# Patient Record
Sex: Male | Born: 1960 | Race: Black or African American | Hispanic: No | Marital: Married | Smoking: Never smoker
Health system: Southern US, Community
[De-identification: ages and names within clinical notes are randomized; demographics above are authoritative.]

## PROBLEM LIST (undated history)

## (undated) ENCOUNTER — Ambulatory Visit

## (undated) ENCOUNTER — Telehealth: Attending: Certified Registered" | Primary: Certified Registered"

## (undated) ENCOUNTER — Encounter: Attending: Certified Registered" | Primary: Certified Registered"

## (undated) ENCOUNTER — Telehealth

## (undated) ENCOUNTER — Encounter

## (undated) ENCOUNTER — Ambulatory Visit: Payer: MEDICARE

## (undated) ENCOUNTER — Encounter: Attending: Internal Medicine | Primary: Internal Medicine

## (undated) ENCOUNTER — Encounter
Attending: Student in an Organized Health Care Education/Training Program | Primary: Student in an Organized Health Care Education/Training Program

## (undated) ENCOUNTER — Ambulatory Visit: Attending: Family | Primary: Family

## (undated) ENCOUNTER — Inpatient Hospital Stay

## (undated) ENCOUNTER — Encounter: Attending: Family | Primary: Family

## (undated) ENCOUNTER — Telehealth: Payer: MEDICARE | Attending: Psychologist | Primary: Psychologist

## (undated) ENCOUNTER — Telehealth: Attending: Internal Medicine | Primary: Internal Medicine

## (undated) ENCOUNTER — Telehealth
Attending: Student in an Organized Health Care Education/Training Program | Primary: Student in an Organized Health Care Education/Training Program

## (undated) ENCOUNTER — Telehealth: Payer: MEDICARE

## (undated) ENCOUNTER — Encounter: Attending: Nephrology | Primary: Nephrology

## (undated) ENCOUNTER — Ambulatory Visit: Payer: MEDICARE | Attending: Internal Medicine | Primary: Internal Medicine

## (undated) ENCOUNTER — Ambulatory Visit: Payer: BLUE CROSS/BLUE SHIELD

## (undated) ENCOUNTER — Ambulatory Visit: Payer: MEDICARE | Attending: Psychologist | Primary: Psychologist

## (undated) ENCOUNTER — Encounter: Attending: Gastroenterology | Primary: Gastroenterology

## (undated) ENCOUNTER — Ambulatory Visit: Payer: Medicare (Managed Care)

## (undated) ENCOUNTER — Ambulatory Visit: Payer: Medicare (Managed Care) | Attending: Nephrology | Primary: Nephrology

## (undated) ENCOUNTER — Ambulatory Visit
Payer: MEDICARE | Attending: Student in an Organized Health Care Education/Training Program | Primary: Student in an Organized Health Care Education/Training Program

## (undated) ENCOUNTER — Telehealth: Attending: Registered" | Primary: Registered"

## (undated) ENCOUNTER — Ambulatory Visit: Attending: Gastroenterology | Primary: Gastroenterology

## (undated) DIAGNOSIS — M1990 Unspecified osteoarthritis, unspecified site: Secondary | ICD-10-CM

## (undated) DIAGNOSIS — R7303 Prediabetes: Secondary | ICD-10-CM

## (undated) DIAGNOSIS — I10 Essential (primary) hypertension: Secondary | ICD-10-CM

---

## 2017-03-09 ENCOUNTER — Emergency Department (HOSPITAL_COMMUNITY)
Admission: EM | Admit: 2017-03-09 | Discharge: 2017-03-09 | Disposition: A | Discharge status: Home / Self Care | Payer: 59 | Attending: Emergency Medicine | Admitting: Emergency Medicine

## 2017-03-09 ENCOUNTER — Emergency Department (HOSPITAL_COMMUNITY): Payer: 59

## 2017-03-09 ENCOUNTER — Encounter (HOSPITAL_COMMUNITY): Payer: Self-pay | Admitting: Emergency Medicine

## 2017-03-09 DIAGNOSIS — Z23 Encounter for immunization: Secondary | ICD-10-CM | POA: Insufficient documentation

## 2017-03-09 DIAGNOSIS — M79672 Pain in left foot: Secondary | ICD-10-CM | POA: Diagnosis present

## 2017-03-09 DIAGNOSIS — S91332A Puncture wound without foreign body, left foot, initial encounter: Secondary | ICD-10-CM | POA: Diagnosis not present

## 2017-03-09 DIAGNOSIS — Y9289 Other specified places as the place of occurrence of the external cause: Secondary | ICD-10-CM | POA: Insufficient documentation

## 2017-03-09 DIAGNOSIS — Y9389 Activity, other specified: Secondary | ICD-10-CM | POA: Insufficient documentation

## 2017-03-09 DIAGNOSIS — W450XXA Nail entering through skin, initial encounter: Secondary | ICD-10-CM | POA: Insufficient documentation

## 2017-03-09 DIAGNOSIS — I10 Essential (primary) hypertension: Secondary | ICD-10-CM | POA: Insufficient documentation

## 2017-03-09 DIAGNOSIS — Y998 Other external cause status: Secondary | ICD-10-CM | POA: Insufficient documentation

## 2017-03-09 HISTORY — DX: Unspecified osteoarthritis, unspecified site: M19.90

## 2017-03-09 HISTORY — DX: Prediabetes: R73.03

## 2017-03-09 HISTORY — DX: Essential (primary) hypertension: I10

## 2017-03-09 MED ORDER — HYDROCODONE-ACETAMINOPHEN 5-325 MG PO TABS: 1.0000 | ORAL_TABLET | ORAL | 0 refills | Status: DC | PRN

## 2017-03-09 MED ORDER — DOXYCYCLINE HYCLATE 100 MG PO CAPS: 100.0000 mg | ORAL_CAPSULE | Freq: Two times a day (BID) | ORAL | 0 refills | Status: DC

## 2017-03-09 MED ORDER — TETANUS-DIPHTH-ACELL PERTUSSIS 5-2.5-18.5 LF-MCG/0.5 IM SUSP
0.5000 mL | Freq: Once | INTRAMUSCULAR | Status: AC
  Administered 2017-03-09: 0.5 mL via INTRAMUSCULAR
  Filled 2017-03-09: qty 0.5

## 2017-03-09 MED ORDER — DOXYCYCLINE HYCLATE 100 MG PO TABS
100.0000 mg | ORAL_TABLET | Freq: Once | ORAL | Status: AC
  Administered 2017-03-09: 100 mg via ORAL
  Filled 2017-03-09: qty 1

## 2017-03-09 NOTE — Discharge Instructions (Signed)
Please soak your foot in warm Epsom salt water daily. Please dry foot carefully. Use doxycycline 2 times daily with a meal. Please see Dr. Adrian BlackwaterWingfield for recheck of your foot sometime this week. Monitor your glucose levels closely.

## 2017-03-09 NOTE — ED Triage Notes (Signed)
Patient c/o left foot pain. Per patient injured foot while trying to walk up stairs at church. Patient states unable to bear full weight on foot.

## 2017-03-09 NOTE — ED Provider Notes (Signed)
[facility]-EMERGENCY DEPT Provider Note   CSN: [id number] Arrival date & time: 03/09/17  1751     History   Chief Complaint Chief Complaint  Patient presents with  . Foot Pain    HPI Truman HaywardRichard Waldron is a 56 y.o. male.  Patient is a 56 year old male who presents to the emergency department with complaint of left foot pain.  The patient states that he thinks he may have injured his foot while walking up some steps at church today. After that he noted that he could not bear full weight on the left foot. No problem reported of the right foot or lower extremity. It is of note that the patient has diabetes it is also note that he has neuropathies related to his lower back and arthritis and degenerative changes in his lower back. No other problems reported at this time.      Past Medical History:  Diagnosis Date  . Arthritis   . Hypertension   . Prediabetes     There are no active problems to display for this patient.   History reviewed. No pertinent surgical history.     Home Medications    Prior to Admission medications   Not on File    Family History History reviewed. No pertinent family history.  Social History Social History  Substance Use Topics  . Smoking status: Never Smoker  . Smokeless tobacco: Never Used  . Alcohol use No     Allergies   Tramadol   Review of Systems Review of Systems  Constitutional: Negative for activity change.       All ROS Neg except as noted in HPI  HENT: Negative for nosebleeds.   Eyes: Negative for photophobia and discharge.  Respiratory: Negative for cough, shortness of breath and wheezing.   Cardiovascular: Negative for chest pain and palpitations.  Gastrointestinal: Negative for abdominal pain and blood in stool.  Genitourinary: Negative for dysuria, frequency and hematuria.  Musculoskeletal: Positive for arthralgias. Negative for back pain and neck pain.       Foot pain  Skin: Negative.   Neurological: Negative for  dizziness, seizures and speech difficulty.  Psychiatric/Behavioral: Negative for confusion and hallucinations.     Physical Exam Updated Vital Signs BP 134/68 (BP Location: Right Arm)   Pulse 71   Temp 97.6 F (36.4 C) (Oral)   Resp 18   Ht 6' (1.829 m)   Wt (!) 156 kg (344 lb)   SpO2 96%   BMI 46.65 kg/m   Physical Exam  Constitutional: He is oriented to person, place, and time. He appears well-developed and well-nourished.  Non-toxic appearance.  HENT:  Head: Normocephalic.  Right Ear: Tympanic membrane and external ear normal.  Left Ear: Tympanic membrane and external ear normal.  Eyes: Pupils are equal, round, and reactive to light. EOM and lids are normal.  Neck: Normal range of motion. Neck supple. Carotid bruit is not present.  Cardiovascular: Normal rate, regular rhythm, normal heart sounds, intact distal pulses and normal pulses.   Pulmonary/Chest: Breath sounds normal. No respiratory distress.  Abdominal: Soft. Bowel sounds are normal. There is no tenderness. There is no guarding.  Musculoskeletal: Normal range of motion. He exhibits tenderness.  Upon removal of the shoe and sock of the left foot, there is noted a nail in the shoe. There is noted some blood on the nail. There is full range of motion of all toes. Capillary refill is less than 2 seconds. The Achilles tendon is intact. The dorsalis pedis  pulses 2+, and the posterior tibial pulse is 2+. There no temperature changes of the left lower extremity.  Lymphadenopathy:       Head (right side): No submandibular adenopathy present.       Head (left side): No submandibular adenopathy present.    He has no cervical adenopathy.  Neurological: He is alert and oriented to person, place, and time. He has normal strength. No cranial nerve deficit or sensory deficit.  Skin: Skin is warm and dry.  Psychiatric: He has a normal mood and affect. His speech is normal.  Nursing note and vitals reviewed.    ED Treatments /  Results  Labs (all labs ordered are listed, but only abnormal results are displayed) Labs Reviewed - No data to display  EKG  EKG Interpretation None       Radiology Dg Foot Complete Left  Result Date: 03/09/2017 CLINICAL DATA:  Left foot pain after walking up stairs. EXAM: LEFT FOOT - COMPLETE 3+ VIEW COMPARISON:  None. FINDINGS: There is no evidence of fracture or dislocation. There is no evidence of arthropathy. Mild spurring of posterior calcaneus is noted. Soft tissues are unremarkable. IMPRESSION: No acute abnormality seen in the left foot. Electronically Signed   By: Lupita RaiderJames  Green Jr, M.D.   On: 03/09/2017 19:15    Procedures Procedures (including critical care time)  Medications Ordered in ED Medications  doxycycline (VIBRA-TABS) tablet 100 mg (not administered)  Tdap (BOOSTRIX) injection 0.5 mL (not administered)     Initial Impression / Assessment and Plan / ED Course  I have reviewed the triage vital signs and the nursing notes.  Pertinent labs & imaging results that were available during my care of the patient were reviewed by me and considered in my medical decision making (see chart for details).       Final Clinical Impressions(s) / ED Diagnoses MDM Vital signs within normal limits.  Nail was noted in the shoe on the left foot. There was blood noted on the nail. No neurovascular deficit appreciated. There are arthritis changes noted on the x-ray of the left foot, but no fracture, no dislocations, and no foreign body appreciated.  Patient's tetanus status was updated. Patient started on doxycycline. The patient will use warm saltwater soaks daily. Prescription for doxycycline given to the patient. The patient is to follow-up with Dr. Adrian BlackwaterWingfield his primary physician this week. The patient will return to the emergency department sooner if any signs of advancing infection. Patient is in agreement with this plan.    Final diagnoses:  Puncture wound of left foot,  initial encounter    New Prescriptions New Prescriptions   No medications on file     Duayne CalBryant, Eldrick Penick, PA-C 03/09/17 2126    Doug SouJacubowitz, Sam, MD 03/10/17 [phone]

## 2018-04-27 ENCOUNTER — Encounter: Admit: 2018-04-27 | Discharge: 2018-04-28 | Payer: MEDICARE

## 2018-04-27 DIAGNOSIS — R748 Abnormal levels of other serum enzymes: Principal | ICD-10-CM

## 2018-04-27 DIAGNOSIS — Z01818 Encounter for other preprocedural examination: Secondary | ICD-10-CM

## 2018-04-27 DIAGNOSIS — K7290 Hepatic failure, unspecified without coma: Secondary | ICD-10-CM

## 2018-04-29 ENCOUNTER — Encounter: Admit: 2018-04-29 | Discharge: 2018-04-29 | Payer: MEDICARE | Attending: Internal Medicine | Primary: Internal Medicine

## 2018-04-29 DIAGNOSIS — R748 Abnormal levels of other serum enzymes: Principal | ICD-10-CM

## 2018-05-19 ENCOUNTER — Encounter: Admit: 2018-05-19 | Discharge: 2018-05-20 | Payer: MEDICARE | Attending: Registered" | Primary: Registered"

## 2018-05-19 ENCOUNTER — Ambulatory Visit: Admit: 2018-05-19 | Discharge: 2018-05-20 | Payer: MEDICARE

## 2018-05-19 ENCOUNTER — Encounter: Admit: 2018-05-19 | Discharge: 2018-05-20 | Payer: MEDICARE

## 2018-05-19 DIAGNOSIS — Z01818 Encounter for other preprocedural examination: Principal | ICD-10-CM

## 2018-05-19 DIAGNOSIS — K7290 Hepatic failure, unspecified without coma: Secondary | ICD-10-CM

## 2018-05-19 DIAGNOSIS — N186 End stage renal disease: Secondary | ICD-10-CM

## 2018-05-20 ENCOUNTER — Encounter: Admit: 2018-05-20 | Discharge: 2018-05-21 | Payer: MEDICARE

## 2018-05-20 DIAGNOSIS — K7290 Hepatic failure, unspecified without coma: Secondary | ICD-10-CM

## 2018-05-20 DIAGNOSIS — Z01818 Encounter for other preprocedural examination: Principal | ICD-10-CM

## 2018-05-25 ENCOUNTER — Encounter: Admit: 2018-05-25 | Discharge: 2018-05-26 | Payer: MEDICARE

## 2018-05-25 DIAGNOSIS — K7460 Unspecified cirrhosis of liver: Secondary | ICD-10-CM

## 2018-05-25 DIAGNOSIS — K7290 Hepatic failure, unspecified without coma: Principal | ICD-10-CM

## 2018-05-25 DIAGNOSIS — G4733 Obstructive sleep apnea (adult) (pediatric): Secondary | ICD-10-CM

## 2018-05-25 DIAGNOSIS — I639 Cerebral infarction, unspecified: Secondary | ICD-10-CM

## 2018-05-25 DIAGNOSIS — K7581 Nonalcoholic steatohepatitis (NASH): Secondary | ICD-10-CM

## 2018-05-25 DIAGNOSIS — Z794 Long term (current) use of insulin: Secondary | ICD-10-CM

## 2018-05-25 DIAGNOSIS — E119 Type 2 diabetes mellitus without complications: Secondary | ICD-10-CM

## 2018-05-25 DIAGNOSIS — Z6833 Body mass index (BMI) 33.0-33.9, adult: Secondary | ICD-10-CM

## 2018-05-25 DIAGNOSIS — Z944 Liver transplant status: Principal | ICD-10-CM

## 2018-05-25 DIAGNOSIS — E6609 Other obesity due to excess calories: Secondary | ICD-10-CM

## 2018-05-25 DIAGNOSIS — E785 Hyperlipidemia, unspecified: Secondary | ICD-10-CM

## 2018-05-25 DIAGNOSIS — I10 Essential (primary) hypertension: Secondary | ICD-10-CM

## 2018-07-20 ENCOUNTER — Encounter: Admit: 2018-07-20 | Discharge: 2018-07-21 | Payer: MEDICARE

## 2018-07-20 ENCOUNTER — Ambulatory Visit: Admit: 2018-07-20 | Discharge: 2018-07-21 | Payer: MEDICARE | Attending: Psychologist | Primary: Psychologist

## 2018-07-20 ENCOUNTER — Ambulatory Visit: Admit: 2018-07-20 | Discharge: 2018-07-21 | Payer: MEDICARE | Attending: Internal Medicine | Primary: Internal Medicine

## 2018-07-20 DIAGNOSIS — K7460 Unspecified cirrhosis of liver: Principal | ICD-10-CM

## 2018-07-20 DIAGNOSIS — Z8673 Personal history of transient ischemic attack (TIA), and cerebral infarction without residual deficits: Secondary | ICD-10-CM

## 2018-07-20 DIAGNOSIS — Z01818 Encounter for other preprocedural examination: Principal | ICD-10-CM

## 2018-07-20 DIAGNOSIS — F4323 Adjustment disorder with mixed anxiety and depressed mood: Secondary | ICD-10-CM

## 2018-07-20 DIAGNOSIS — K7290 Hepatic failure, unspecified without coma: Secondary | ICD-10-CM

## 2019-03-29 NOTE — Unmapped (Signed)
Patient states at night his stomach is hurting after he eats. pain is close to gall bladder  Pain in the lower part of my back  He is unable to sleep. Bowels are normal.

## 2019-03-30 ENCOUNTER — Telehealth: Admit: 2019-03-30 | Discharge: 2019-03-31

## 2019-05-25 ENCOUNTER — Ambulatory Visit: Admit: 2019-05-25 | Discharge: 2019-05-26

## 2019-05-25 DIAGNOSIS — K7460 Unspecified cirrhosis of liver: Principal | ICD-10-CM

## 2019-05-27 MED ORDER — CHOLECALCIFEROL (VITAMIN D3) 25 MCG (1,000 UNIT) TABLET: 2000 [IU] | tablet | Freq: Every day | 11 refills | 30 days | Status: AC

## 2019-10-25 ENCOUNTER — Institutional Professional Consult (permissible substitution): Admit: 2019-10-25 | Discharge: 2019-10-26

## 2019-11-24 DIAGNOSIS — Z01818 Encounter for other preprocedural examination: Principal | ICD-10-CM

## 2019-11-26 DIAGNOSIS — K7290 Hepatic failure, unspecified without coma: Principal | ICD-10-CM

## 2020-01-03 ENCOUNTER — Encounter: Admit: 2020-01-03 | Discharge: 2020-01-04 | Payer: MEDICARE | Attending: Psychologist | Primary: Psychologist

## 2020-01-03 ENCOUNTER — Institutional Professional Consult (permissible substitution): Admit: 2020-01-03 | Discharge: 2020-01-04 | Payer: MEDICARE

## 2020-01-03 ENCOUNTER — Encounter: Admit: 2020-01-03 | Discharge: 2020-01-04 | Payer: MEDICARE | Attending: Nutritionist | Primary: Nutritionist

## 2020-01-03 ENCOUNTER — Encounter: Admit: 2020-01-03 | Discharge: 2020-01-04 | Payer: MEDICARE

## 2020-01-18 ENCOUNTER — Encounter: Admit: 2020-01-18 | Discharge: 2020-01-18 | Payer: MEDICARE

## 2020-01-18 ENCOUNTER — Ambulatory Visit: Admit: 2020-01-18 | Discharge: 2020-01-18 | Payer: MEDICARE

## 2020-01-18 DIAGNOSIS — Z01818 Encounter for other preprocedural examination: Principal | ICD-10-CM

## 2020-01-24 ENCOUNTER — Encounter: Admit: 2020-01-24 | Discharge: 2020-01-24 | Payer: MEDICARE

## 2020-01-24 ENCOUNTER — Ambulatory Visit: Admit: 2020-01-24 | Discharge: 2020-01-24 | Payer: MEDICARE

## 2020-01-24 DIAGNOSIS — I639 Cerebral infarction, unspecified: Principal | ICD-10-CM

## 2020-01-24 DIAGNOSIS — I10 Essential (primary) hypertension: Principal | ICD-10-CM

## 2020-01-24 DIAGNOSIS — Z01818 Encounter for other preprocedural examination: Principal | ICD-10-CM

## 2020-01-24 DIAGNOSIS — K7290 Hepatic failure, unspecified without coma: Principal | ICD-10-CM

## 2020-01-24 DIAGNOSIS — G4733 Obstructive sleep apnea (adult) (pediatric): Principal | ICD-10-CM

## 2020-01-24 DIAGNOSIS — E119 Type 2 diabetes mellitus without complications: Principal | ICD-10-CM

## 2020-01-24 DIAGNOSIS — Z794 Long term (current) use of insulin: Secondary | ICD-10-CM

## 2020-05-31 ENCOUNTER — Emergency Department (HOSPITAL_COMMUNITY)
Admission: EM | Admit: 2020-05-31 | Discharge: 2020-05-31 | Disposition: A | Discharge status: Home / Self Care | Payer: 59 | Attending: Emergency Medicine | Admitting: Emergency Medicine

## 2020-05-31 ENCOUNTER — Encounter (HOSPITAL_COMMUNITY): Payer: Self-pay | Admitting: Emergency Medicine

## 2020-05-31 ENCOUNTER — Other Ambulatory Visit: Payer: Self-pay

## 2020-05-31 ENCOUNTER — Emergency Department (HOSPITAL_COMMUNITY): Payer: 59

## 2020-05-31 DIAGNOSIS — M25511 Pain in right shoulder: Secondary | ICD-10-CM

## 2020-05-31 DIAGNOSIS — I10 Essential (primary) hypertension: Secondary | ICD-10-CM | POA: Diagnosis not present

## 2020-05-31 DIAGNOSIS — M19011 Primary osteoarthritis, right shoulder: Secondary | ICD-10-CM | POA: Insufficient documentation

## 2020-05-31 MED ORDER — HYDROCODONE-ACETAMINOPHEN 5-325 MG PO TABS: 1.0000 | ORAL_TABLET | ORAL | 0 refills | Status: DC | PRN

## 2020-05-31 MED ORDER — HYDROCODONE-ACETAMINOPHEN 5-325 MG PO TABS
1.0000 | ORAL_TABLET | Freq: Once | ORAL | Status: AC
  Administered 2020-05-31: 1 via ORAL
  Filled 2020-05-31: qty 1

## 2020-05-31 NOTE — ED Notes (Signed)
Patient transported to X-ray 

## 2020-05-31 NOTE — ED Triage Notes (Signed)
Pt c/o right arm pain x1 month. Pt states he can no longer stand the pain.

## 2020-05-31 NOTE — Discharge Instructions (Signed)
Schedule to see the Orthopaedist for evaluation.  You will need to obtain your previous ct scan for the Orthopaedist to see

## 2020-06-01 NOTE — ED Provider Notes (Signed)
[facility] EMERGENCY DEPARTMENT Provider Note   CSN: [id number] Arrival date & time: 05/31/20  7616     History Chief Complaint  Patient presents with  . Arm Pain    William Kemp is a 59 y.o. male.  The history is provided by the patient. No language interpreter was used.  Arm Pain This is a recurrent problem. Episode onset: 1 month. The problem occurs constantly. The problem has been gradually worsening. Nothing aggravates the symptoms. Nothing relieves the symptoms. He has tried nothing for the symptoms. The treatment provided no relief.   Pt reports he has had shoulder pain in the past.  Pt out of pain medications.  Pt reports he has had an evaluation and ct in the past     Past Medical History:  Diagnosis Date  . Arthritis   . Hypertension   . Prediabetes     There are no problems to display for this patient.   History reviewed. No pertinent surgical history.     History reviewed. No pertinent family history.  Social History   Tobacco Use  . Smoking status: Never Smoker  . Smokeless tobacco: Never Used  Vaping Use  . Vaping Use: Never used  Substance Use Topics  . Alcohol use: No  . Drug use: No    Home Medications Prior to Admission medications   Medication Sig Start Date End Date Taking? Authorizing Provider  doxycycline (VIBRAMYCIN) 100 MG capsule Take 1 capsule (100 mg total) by mouth 2 (two) times daily. 03/09/17   Ivery Quale, PA-C  HYDROcodone-acetaminophen (NORCO/VICODIN) 5-325 MG tablet Take 1 tablet by mouth every 4 (four) hours as needed. 05/31/20   Elson Areas, PA-C    Allergies    Tramadol  Review of Systems   Review of Systems  All other systems reviewed and are negative.   Physical Exam Updated Vital Signs BP 131/73 (BP Location: Left Arm)   Pulse (!) 52   Temp 98.2 F (36.8 C) (Oral)   Resp 18   Ht 6' (1.829 m)   Wt (!) 152 kg   SpO2 98%   BMI 45.43 kg/m   Physical Exam Vitals and nursing note reviewed.    Constitutional:      Appearance: He is well-developed.  HENT:     Head: Normocephalic and atraumatic.  Eyes:     Conjunctiva/sclera: Conjunctivae normal.  Cardiovascular:     Rate and Rhythm: Normal rate and regular rhythm.     Heart sounds: No murmur heard.   Pulmonary:     Effort: Pulmonary effort is normal. No respiratory distress.     Breath sounds: Normal breath sounds.  Abdominal:     Palpations: Abdomen is soft.     Tenderness: There is no abdominal tenderness.  Musculoskeletal:        General: Normal range of motion.     Cervical back: Neck supple.  Skin:    General: Skin is warm and dry.  Neurological:     General: No focal deficit present.     Mental Status: He is alert.  Psychiatric:        Mood and Affect: Mood normal.     ED Results / Procedures / Treatments   Labs (all labs ordered are listed, but only abnormal results are displayed) Labs Reviewed - No data to display  EKG None  Radiology DG Shoulder Right  Result Date: 05/31/2020 CLINICAL DATA:  Pain for 6 years.  No injury. EXAM: RIGHT SHOULDER - 2+  VIEW COMPARISON:  None. FINDINGS: There is no evidence of fracture or dislocation. Mild glenohumeral osteoarthritis with marginal osteophytes. Small inferiorly directed osteophyte arising from the acromion. IMPRESSION: 1. No evidence of acute fracture or dislocation. 2. Mild glenohumeral osteoarthritis with marginal osteophytes. Electronically Signed   By: Feliberto Harts MD   On: 05/31/2020 10:19    Procedures Procedures (including critical care time)  Medications Ordered in ED Medications  HYDROcodone-acetaminophen (NORCO/VICODIN) 5-325 MG per tablet 1 tablet (1 tablet Oral Given 05/31/20 1059)    ED Course  I have reviewed the triage vital signs and the nursing notes.  Pertinent labs & imaging results that were available during my care of the patient were reviewed by me and considered in my medical decision making (see chart for details).     MDM Rules/Calculators/A&P                          MDM:  Xray reviewed.  Pt given hydrocodone.  Rx for hydrocodone.  Pt advised to schedule to see Dr. Romeo Apple.  Discuss pain medication with primary MD.  Final Clinical Impression(s) / ED Diagnoses Final diagnoses:  Primary osteoarthritis of right shoulder  Acute pain of right shoulder    Rx / DC Orders ED Discharge Orders         Ordered    HYDROcodone-acetaminophen (NORCO/VICODIN) 5-325 MG tablet  Every 4 hours PRN,   Status:  Discontinued        05/31/20 1045    HYDROcodone-acetaminophen (NORCO/VICODIN) 5-325 MG tablet  Every 4 hours PRN        05/31/20 1051        An After Visit Summary was printed and given to the patient.    Elson Areas, [place] 06/01/20 1026    Terrilee Files, MD 06/01/20 1247

## 2020-06-12 ENCOUNTER — Other Ambulatory Visit: Payer: Self-pay

## 2020-06-12 ENCOUNTER — Encounter: Payer: Self-pay | Admitting: Orthopedic Surgery

## 2020-06-12 ENCOUNTER — Ambulatory Visit (INDEPENDENT_AMBULATORY_CARE_PROVIDER_SITE_OTHER): Payer: 59 | Admitting: Orthopedic Surgery

## 2020-06-12 ENCOUNTER — Ambulatory Visit: Payer: 59

## 2020-06-12 VITALS — BP 145/70 | HR 88 | Ht 72.0 in | Wt 344.0 lb

## 2020-06-12 DIAGNOSIS — M4722 Other spondylosis with radiculopathy, cervical region: Secondary | ICD-10-CM

## 2020-06-12 DIAGNOSIS — I509 Heart failure, unspecified: Secondary | ICD-10-CM | POA: Insufficient documentation

## 2020-06-12 DIAGNOSIS — G4730 Sleep apnea, unspecified: Secondary | ICD-10-CM

## 2020-06-12 DIAGNOSIS — M109 Gout, unspecified: Secondary | ICD-10-CM | POA: Insufficient documentation

## 2020-06-12 DIAGNOSIS — F40240 Claustrophobia: Secondary | ICD-10-CM | POA: Diagnosis not present

## 2020-06-12 DIAGNOSIS — N138 Other obstructive and reflux uropathy: Secondary | ICD-10-CM | POA: Insufficient documentation

## 2020-06-12 DIAGNOSIS — N529 Male erectile dysfunction, unspecified: Secondary | ICD-10-CM

## 2020-06-12 DIAGNOSIS — M792 Neuralgia and neuritis, unspecified: Secondary | ICD-10-CM

## 2020-06-12 DIAGNOSIS — I10 Essential (primary) hypertension: Secondary | ICD-10-CM | POA: Insufficient documentation

## 2020-06-12 DIAGNOSIS — E119 Type 2 diabetes mellitus without complications: Secondary | ICD-10-CM | POA: Insufficient documentation

## 2020-06-12 HISTORY — DX: Male erectile dysfunction, unspecified: N52.9

## 2020-06-12 HISTORY — DX: Gout, unspecified: M10.9

## 2020-06-12 HISTORY — DX: Sleep apnea, unspecified: G47.30

## 2020-06-12 HISTORY — DX: Other obstructive and reflux uropathy: N13.8

## 2020-06-12 HISTORY — DX: Type 2 diabetes mellitus without complications: E11.9

## 2020-06-12 HISTORY — DX: Morbid (severe) obesity due to excess calories: E66.01

## 2020-06-12 HISTORY — DX: Heart failure, unspecified: I50.9

## 2020-06-12 MED ORDER — DIAZEPAM 5 MG PO TABS: 5.0000 mg | ORAL_TABLET | Freq: Once | ORAL | 0 refills | Status: AC

## 2020-06-12 NOTE — Patient Instructions (Signed)
MRI scan has been ordered for you,your insurance doesn't require an authorization you can call to schedule at [facility] the number is [phone]

## 2020-06-12 NOTE — Progress Notes (Signed)
Chief Complaint  Patient presents with  . Shoulder Pain    right   . Arm Pain    entire right arm painful / right hand goes numb  . Neck Pain    59 year old male with chronic neck pain and radiation of pain into his right arm sending for MRI with Valium due to severe claustrophobia  Encounter Diagnoses  Name Primary?  . Radicular pain of right upper extremity   . Cervical spondylosis with radiculopathy   . Claustrophobia Yes     History this is a 59 year old male on Eliquis secondary to A. fib had a lower back injury several years ago at work since that time he has had neck pain which now includes right arm pain radiating to his right hand for the last 6 months worsening.  Had physical therapy medication did not improve his night pain became so severe that he went to the emergency room and they referred him here  He notes his color changes in his hand without weakness just severe pain  Review of systems arthritis heart disease A. fib hypertension  Review of systems neck back and joint pain tingling right upper extremity pain in legs after walking all other review of systems were negative  Past Medical History:  Diagnosis Date  . Arthritis   . Diabetes mellitus (HCC) 06/12/2020  . Hypertension   . Prediabetes    History reviewed. No pertinent surgical history.   History reviewed. No pertinent family history.   BP (!) 145/70   Pulse 88   Ht 6' (1.829 m)   Wt (!) 344 lb (156 kg)   BMI 46.65 kg/m   The patient meets the AMA guidelines for Morbid (severe) obesity with a BMI > 40.0 and I have recommended weight loss.  General appearance: Development normal nutrition obesity grooming normal  Cardiovascular normal pulses bilaterally in the upper extremities normal temperature no edema  Lymph nodes normal neck and axilla right arm  Gait no abnormalities  Cervical spine tender primarily mid to lower no periscapular tenderness upper neck tenderness in the trapezius muscle  full range of motion of the right shoulder with normal strength in both upper extremities including C5-6-7 8 T1  No increased muscle tone is noted.  Both shoulders are stable.  Skin is normal in the upper extremities and neck and back.  Sensation is normal  Reflexes are 2+ and equal  He is oriented x3 his mood and affect are normal

## 2020-06-22 ENCOUNTER — Other Ambulatory Visit: Payer: Self-pay

## 2020-06-22 ENCOUNTER — Ambulatory Visit (HOSPITAL_COMMUNITY)
Admission: RE | Admit: 2020-06-22 | Discharge: 2020-06-22 | Disposition: A | Discharge status: Home / Self Care | Payer: 59 | Source: Ambulatory Visit | Attending: Orthopedic Surgery | Admitting: Orthopedic Surgery

## 2020-06-22 DIAGNOSIS — M792 Neuralgia and neuritis, unspecified: Secondary | ICD-10-CM

## 2020-06-23 ENCOUNTER — Telehealth: Payer: Self-pay | Admitting: Orthopedic Surgery

## 2020-06-23 NOTE — Telephone Encounter (Signed)
Called patient to schedule an appt / follow up after MRI.  Left message to call the office and schedule an appt.

## 2020-06-27 NOTE — Telephone Encounter (Signed)
Called patient to schedule an appt / follow up after MRI.  Left message to call the office and schedule an appt.  °

## 2020-07-14 ENCOUNTER — Encounter: Payer: Self-pay | Admitting: Orthopedic Surgery

## 2020-07-19 ENCOUNTER — Telehealth: Payer: Self-pay | Admitting: Radiology

## 2020-07-19 NOTE — Telephone Encounter (Signed)
Patient called today and said he needed to make a f/u appt to discuss MRI results.  MRI done 06/22/20.  I have now updated his phone number in the system.  I think we had an old number, and we had mailed him a letter to call us to schedule.    Would you want to call results to him since your schedule is so full?  Or tell me where/when you would like to work him in?

## 2020-07-20 NOTE — Telephone Encounter (Signed)
I LL LOOK AT IT TODAY

## 2020-08-15 DIAGNOSIS — Z7682 Awaiting organ transplant status: Principal | ICD-10-CM

## 2020-08-15 DIAGNOSIS — K7210 Chronic hepatic failure without coma: Principal | ICD-10-CM

## 2020-08-21 ENCOUNTER — Ambulatory Visit: Admit: 2020-08-21 | Discharge: 2020-08-22 | Payer: MEDICARE

## 2020-08-21 DIAGNOSIS — K7460 Unspecified cirrhosis of liver: Principal | ICD-10-CM

## 2020-08-21 DIAGNOSIS — Z6837 Body mass index (BMI) 37.0-37.9, adult: Principal | ICD-10-CM

## 2020-08-21 MED ORDER — SPIRONOLACTONE 50 MG TABLET
ORAL_TABLET | Freq: Every day | ORAL | 3 refills | 90 days | Status: CP
Start: 2020-08-21 — End: 2020-09-11

## 2020-08-21 MED ORDER — FUROSEMIDE 20 MG TABLET
ORAL_TABLET | Freq: Every day | ORAL | 3 refills | 90.00000 days | Status: CP
Start: 2020-08-21 — End: 2020-09-11

## 2020-08-22 ENCOUNTER — Ambulatory Visit: Admit: 2020-08-22 | Discharge: 2020-08-23 | Payer: MEDICARE

## 2020-08-22 DIAGNOSIS — K7460 Unspecified cirrhosis of liver: Principal | ICD-10-CM

## 2020-08-22 DIAGNOSIS — R188 Other ascites: Principal | ICD-10-CM

## 2020-08-22 DIAGNOSIS — I10 Essential (primary) hypertension: Principal | ICD-10-CM

## 2020-08-23 DIAGNOSIS — K652 Spontaneous bacterial peritonitis: Principal | ICD-10-CM

## 2020-08-23 MED ORDER — SULFAMETHOXAZOLE 800 MG-TRIMETHOPRIM 160 MG TABLET
ORAL_TABLET | Freq: Every day | ORAL | 0 refills | 90.00000 days | Status: CP
Start: 2020-08-23 — End: 2020-09-11

## 2020-08-29 DIAGNOSIS — Z7682 Awaiting organ transplant status: Principal | ICD-10-CM

## 2020-08-29 DIAGNOSIS — K7210 Chronic hepatic failure without coma: Principal | ICD-10-CM

## 2020-08-30 ENCOUNTER — Ambulatory Visit: Admit: 2020-08-30 | Discharge: 2020-08-31 | Payer: MEDICARE

## 2020-08-30 DIAGNOSIS — K652 Spontaneous bacterial peritonitis: Principal | ICD-10-CM

## 2020-08-30 DIAGNOSIS — K625 Hemorrhage of anus and rectum: Principal | ICD-10-CM

## 2020-08-30 DIAGNOSIS — K7460 Unspecified cirrhosis of liver: Principal | ICD-10-CM

## 2020-08-30 DIAGNOSIS — R188 Other ascites: Principal | ICD-10-CM

## 2020-08-31 ENCOUNTER — Other Ambulatory Visit: Payer: Self-pay

## 2020-08-31 ENCOUNTER — Ambulatory Visit (INDEPENDENT_AMBULATORY_CARE_PROVIDER_SITE_OTHER): Payer: 59 | Admitting: Orthopedic Surgery

## 2020-08-31 ENCOUNTER — Encounter: Payer: Self-pay | Admitting: Orthopedic Surgery

## 2020-08-31 VITALS — BP 171/84 | HR 62 | Ht 72.0 in | Wt 350.0 lb

## 2020-08-31 DIAGNOSIS — M4722 Other spondylosis with radiculopathy, cervical region: Secondary | ICD-10-CM | POA: Diagnosis not present

## 2020-08-31 NOTE — Patient Instructions (Signed)
Anterior Cervical Diskectomy and Fusion Anterior cervical diskectomy and fusion is a surgery to remove and replace an intervertebral disk. Intervertebral disks are plates of cartilage located between the bones of the spine. This surgery is done when an intervertebral disk in the neck puts pressure on the spine or on a nerve. The surgery is done through the front (anterior) part of the neck. During the surgery, the damaged disk is removed and replaced with a plastic implant, a bone from another part of the body (bone graft), or both. Sometimes metal plates and screws are also used to keep the implant or bone graft in place and to join the bones together. Tell a health care provider about:  Any allergies you have.  All medicines you are taking, including vitamins, herbs, eye drops, creams, and over-the-counter medicines.  Any problems you or family members have had with anesthetic medicines.  Any blood disorders you have.  Any surgeries you have had.  Any medical conditions you have or have had.  Whether you are pregnant or may be pregnant. What are the risks? Generally, this is a safe procedure. However, problems may occur, including:  Infection.  Bleeding, which can sometimes require a blood transfusion.  Injury to surrounding structures, including nerves.  Leakage of fluid from the brain or spinal cord.  Blood clots.  Temporary breathing or speaking difficulties.  Difficulty swallowing. What happens before the procedure? Staying hydrated Follow instructions from your health care provider about hydration, which may include:  Up to 2 hours before the procedure - you may continue to drink clear liquids, such as water, clear fruit juice, black coffee, and plain tea.   Eating and drinking Follow instructions from your health care provider about eating and drinking, which may include:  8 hours before the procedure - stop eating heavy meals or foods, such as meat, fried foods, or  fatty foods.  6 hours before the procedure - stop eating light meals or foods, such as toast or cereal.  6 hours before the procedure - stop drinking milk or drinks that contain milk.  2 hours before the procedure - stop drinking clear liquids. Medicines Ask your health care provider about:  Changing or stopping your regular medicines. This is especially important if you are taking diabetes medicines or blood thinners.  Taking medicines such as aspirin and ibuprofen. These medicines can thin your blood. Do not take these medicines unless your health care provider tells you to take them.  Taking over-the-counter medicines, vitamins, herbs, and supplements. Surgery safety Ask your health care provider:  How your surgery site will be marked.  What steps will be taken to help prevent infection. These may include: ? Removing hair at the surgery site. ? Washing skin with a germ-killing soap. ? Receiving antibiotic medicine. General instructions  Do not use any products that contain nicotine or tobacco for at least 4 weeks before the procedure. These products include cigarettes, e-cigarettes, and chewing tobacco. If you need help quitting, ask your health care provider.  Plan to have someone take you home from the hospital or clinic. What happens during the procedure?  An IV will be inserted into one of your veins.  You will be given one or more of the following: ? A medicine to help you relax (sedative). ? A medicine to make you fall asleep (general anesthetic).  A breathing tube will be placed.  Your neck will be cleaned with a germ-killing solution (antiseptic solution).  Your surgeon will make an  incision in the front of your neck.  Your neck muscles will be spread apart.  The damaged disk and any damaged bone will be removed.  The area where the disk was removed will be filled with a small plastic implant, a bone graft, or both.  Hardware may be put in your neck.  The  incision will be closed with stitches (sutures).  Adhesive strips or skin glue may be placed across the incision.  A bandage (dressing) will be applied over the incision. The procedure may vary among health care providers and hospitals.   What happens after the procedure?  Your blood pressure, heart rate, breathing rate, and blood oxygen level will be monitored until you leave the hospital or clinic.  You may continue to receive antibiotics.  You can start to eat as soon as you feel comfortable.  You may be given a neck brace to wear. This brace limits your neck movement while your bones are fusing.  If you were given a sedative during the procedure, it can affect you for several hours. Do not drive or operate machinery until your health care provider says that it is safe. Summary  Anterior cervical diskectomy and fusion is a surgery to remove and replace an intervertebral disk. The surgery is done through the front (anterior) part of the neck.  Before the procedure, follow instructions from your health care provider about changing or stopping your medicines and about eating and drinking.  During the procedure, the damaged disk and any damaged bone will be removed.  You may be given a neck brace to wear. This brace limits your neck movement while your bones are fusing. This information is not intended to replace advice given to you by your health care provider. Make sure you discuss any questions you have with your health care provider. Document Revised: 11/17/2019 Document Reviewed: 11/17/2019 Elsevier Patient Education  2021 [employer].

## 2020-08-31 NOTE — Progress Notes (Signed)
Chief Complaint  Patient presents with  . Neck Pain  . Results    Review MRI    60 year old male with arm pain and neck pain did not respond to nonoperative measures and oral medication including gabapentin and NSAIDs sent for MRI comes in complaining of persistent arm pain  His MRI shows multilevel degenerative disc disease  The impression from radiology was  IMPRESSION: Mild C4-5 spinal canal narrowing.  Moderate to severe bilateral C4-5 and left C7-T1 neural foraminal narrowing.  Mild right C2-3, bilateral C3-4, C5-7 neural foraminal narrowing.   Electronically Signed   By: Stana Bunting M.D.   On: 06/22/2020 14:53  I showed him his images a model and pictures and made a referral for neurosurgery to take over care  Chronic, independent x-ray image interpretation was performed

## 2020-09-01 ENCOUNTER — Encounter: Admit: 2020-09-01 | Discharge: 2020-09-02 | Payer: MEDICARE

## 2020-09-01 DIAGNOSIS — K7460 Unspecified cirrhosis of liver: Principal | ICD-10-CM

## 2020-09-04 DIAGNOSIS — K7210 Chronic hepatic failure without coma: Principal | ICD-10-CM

## 2020-09-04 DIAGNOSIS — Z7682 Awaiting organ transplant status: Principal | ICD-10-CM

## 2020-09-06 MED ORDER — GABAPENTIN 300 MG CAPSULE
ORAL_CAPSULE | Freq: Every evening | ORAL | 3 refills | 270.00000 days | Status: CP
Start: 2020-09-06 — End: ?

## 2020-09-11 ENCOUNTER — Encounter: Admit: 2020-09-11 | Discharge: 2020-09-12 | Payer: MEDICARE

## 2020-09-11 DIAGNOSIS — Z7682 Awaiting organ transplant status: Principal | ICD-10-CM

## 2020-09-11 DIAGNOSIS — K7210 Chronic hepatic failure without coma: Principal | ICD-10-CM

## 2020-09-11 DIAGNOSIS — K7460 Unspecified cirrhosis of liver: Principal | ICD-10-CM

## 2020-09-18 DIAGNOSIS — K7210 Chronic hepatic failure without coma: Principal | ICD-10-CM

## 2020-09-18 DIAGNOSIS — Z7682 Awaiting organ transplant status: Principal | ICD-10-CM

## 2020-09-25 DIAGNOSIS — Z7682 Awaiting organ transplant status: Principal | ICD-10-CM

## 2020-09-25 DIAGNOSIS — K7210 Chronic hepatic failure without coma: Principal | ICD-10-CM

## 2020-10-02 DIAGNOSIS — Z7682 Awaiting organ transplant status: Principal | ICD-10-CM

## 2020-10-02 DIAGNOSIS — K7210 Chronic hepatic failure without coma: Principal | ICD-10-CM

## 2020-10-05 ENCOUNTER — Ambulatory Visit: Admit: 2020-10-05 | Discharge: 2020-10-06 | Payer: MEDICARE

## 2020-10-06 DIAGNOSIS — K7460 Unspecified cirrhosis of liver: Principal | ICD-10-CM

## 2020-10-09 DIAGNOSIS — Z7682 Awaiting organ transplant status: Principal | ICD-10-CM

## 2020-10-09 DIAGNOSIS — K7210 Chronic hepatic failure without coma: Principal | ICD-10-CM

## 2020-10-12 ENCOUNTER — Encounter: Admit: 2020-10-12 | Discharge: 2020-10-12 | Payer: MEDICARE

## 2020-10-12 ENCOUNTER — Encounter
Admit: 2020-10-12 | Discharge: 2020-10-12 | Payer: MEDICARE | Attending: Certified Registered" | Primary: Certified Registered"

## 2020-10-12 MED ORDER — PANTOPRAZOLE 40 MG TABLET,DELAYED RELEASE
ORAL_TABLET | Freq: Every day | ORAL | 0 refills | 60 days | Status: CP
Start: 2020-10-12 — End: 2020-11-11

## 2020-10-13 DIAGNOSIS — K7460 Unspecified cirrhosis of liver: Principal | ICD-10-CM

## 2020-10-13 MED ORDER — FLUCONAZOLE 200 MG TABLET
ORAL_TABLET | Freq: Every day | ORAL | 0 refills | 15 days | Status: CP
Start: 2020-10-13 — End: 2020-10-28

## 2020-10-14 DIAGNOSIS — B49 Unspecified mycosis: Principal | ICD-10-CM

## 2020-10-14 MED ORDER — FLUCONAZOLE 200 MG TABLET
ORAL_TABLET | Freq: Every day | ORAL | 0 refills | 13.00000 days | Status: CP
Start: 2020-10-14 — End: 2020-10-27

## 2020-10-16 DIAGNOSIS — K7210 Chronic hepatic failure without coma: Principal | ICD-10-CM

## 2020-10-16 DIAGNOSIS — Z7682 Awaiting organ transplant status: Principal | ICD-10-CM

## 2020-10-23 DIAGNOSIS — Z7682 Awaiting organ transplant status: Principal | ICD-10-CM

## 2020-10-23 DIAGNOSIS — K7210 Chronic hepatic failure without coma: Principal | ICD-10-CM

## 2020-10-30 DIAGNOSIS — Z7682 Awaiting organ transplant status: Principal | ICD-10-CM

## 2020-10-30 DIAGNOSIS — K7210 Chronic hepatic failure without coma: Principal | ICD-10-CM

## 2020-11-06 DIAGNOSIS — K7210 Chronic hepatic failure without coma: Principal | ICD-10-CM

## 2020-11-06 DIAGNOSIS — Z7682 Awaiting organ transplant status: Principal | ICD-10-CM

## 2020-11-13 DIAGNOSIS — Z7682 Awaiting organ transplant status: Principal | ICD-10-CM

## 2020-11-13 DIAGNOSIS — K7210 Chronic hepatic failure without coma: Principal | ICD-10-CM

## 2020-11-17 DIAGNOSIS — K7210 Chronic hepatic failure without coma: Principal | ICD-10-CM

## 2020-11-20 DIAGNOSIS — Z7682 Awaiting organ transplant status: Principal | ICD-10-CM

## 2020-11-20 DIAGNOSIS — K7210 Chronic hepatic failure without coma: Principal | ICD-10-CM

## 2020-11-21 ENCOUNTER — Encounter: Admit: 2020-11-21 | Discharge: 2020-11-22 | Payer: MEDICARE

## 2020-11-27 ENCOUNTER — Encounter: Admit: 2020-11-27 | Discharge: 2020-11-28 | Payer: MEDICARE

## 2020-11-27 DIAGNOSIS — K7210 Chronic hepatic failure without coma: Principal | ICD-10-CM

## 2020-11-27 DIAGNOSIS — K7460 Unspecified cirrhosis of liver: Principal | ICD-10-CM

## 2020-11-27 DIAGNOSIS — Z7682 Awaiting organ transplant status: Principal | ICD-10-CM

## 2020-12-04 ENCOUNTER — Other Ambulatory Visit: Payer: Self-pay

## 2020-12-04 ENCOUNTER — Emergency Department (HOSPITAL_COMMUNITY)
Admission: EM | Admit: 2020-12-04 | Discharge: 2020-12-04 | Disposition: A | Discharge status: Home / Self Care | Payer: 59 | Attending: Emergency Medicine | Admitting: Emergency Medicine

## 2020-12-04 ENCOUNTER — Emergency Department (HOSPITAL_COMMUNITY): Payer: 59

## 2020-12-04 ENCOUNTER — Encounter (HOSPITAL_COMMUNITY): Payer: Self-pay | Admitting: Emergency Medicine

## 2020-12-04 DIAGNOSIS — R600 Localized edema: Secondary | ICD-10-CM | POA: Insufficient documentation

## 2020-12-04 DIAGNOSIS — J45901 Unspecified asthma with (acute) exacerbation: Secondary | ICD-10-CM | POA: Diagnosis not present

## 2020-12-04 DIAGNOSIS — E119 Type 2 diabetes mellitus without complications: Secondary | ICD-10-CM | POA: Diagnosis not present

## 2020-12-04 DIAGNOSIS — I10 Essential (primary) hypertension: Secondary | ICD-10-CM | POA: Insufficient documentation

## 2020-12-04 DIAGNOSIS — Z79899 Other long term (current) drug therapy: Secondary | ICD-10-CM | POA: Insufficient documentation

## 2020-12-04 DIAGNOSIS — Z7901 Long term (current) use of anticoagulants: Secondary | ICD-10-CM | POA: Diagnosis not present

## 2020-12-04 DIAGNOSIS — R0602 Shortness of breath: Secondary | ICD-10-CM | POA: Diagnosis present

## 2020-12-04 DIAGNOSIS — Z7682 Awaiting organ transplant status: Principal | ICD-10-CM

## 2020-12-04 DIAGNOSIS — K7210 Chronic hepatic failure without coma: Principal | ICD-10-CM

## 2020-12-04 LAB — CBC WITH DIFFERENTIAL/PLATELET
Abs Immature Granulocytes: 0.01 10*3/uL (ref 0.00–0.07)
Basophils Absolute: 0 10*3/uL (ref 0.0–0.1)
Basophils Relative: 1 %
Eosinophils Absolute: 0.2 10*3/uL (ref 0.0–0.5)
Eosinophils Relative: 4 %
HCT: 40.1 % (ref 39.0–52.0)
Hemoglobin: 13.3 g/dL (ref 13.0–17.0)
Immature Granulocytes: 0 %
Lymphocytes Relative: 36 %
Lymphs Abs: 2 10*3/uL (ref 0.7–4.0)
MCH: 32 pg (ref 26.0–34.0)
MCHC: 33.2 g/dL (ref 30.0–36.0)
MCV: 96.6 fL (ref 80.0–100.0)
Monocytes Absolute: 0.5 10*3/uL (ref 0.1–1.0)
Monocytes Relative: 10 %
Neutro Abs: 2.7 10*3/uL (ref 1.7–7.7)
Neutrophils Relative %: 49 %
Platelets: 258 10*3/uL (ref 150–400)
RBC: 4.15 MIL/uL — ABNORMAL LOW (ref 4.22–5.81)
RDW: 14.5 % (ref 11.5–15.5)
WBC: 5.4 10*3/uL (ref 4.0–10.5)
nRBC: 0 % (ref 0.0–0.2)

## 2020-12-04 LAB — BASIC METABOLIC PANEL
Anion gap: 11 (ref 5–15)
BUN: 15 mg/dL (ref 8–23)
CO2: 25 mmol/L (ref 22–32)
Calcium: 9 mg/dL (ref 8.9–10.3)
Chloride: 103 mmol/L (ref 98–111)
Creatinine, Ser: 0.94 mg/dL (ref 0.61–1.24)
GFR, Estimated: >60 mL/min (ref >60)
Glucose, Bld: 120 mg/dL — ABNORMAL HIGH (ref 70–99)
Potassium: 3.5 mmol/L (ref 3.5–5.1)
Sodium: 139 mmol/L (ref 135–145)

## 2020-12-04 LAB — TROPONIN I (HIGH SENSITIVITY): Troponin I (High Sensitivity): 11 ng/L (ref <18)

## 2020-12-04 MED ORDER — PREDNISONE 50 MG PO TABS
60.0000 mg | ORAL_TABLET | Freq: Once | ORAL | Status: AC
  Administered 2020-12-04: 60 mg via ORAL
  Filled 2020-12-04: qty 1

## 2020-12-04 MED ORDER — PREDNISONE 10 MG (21) PO TBPK: ORAL_TABLET | ORAL | 0 refills | Status: AC

## 2020-12-04 NOTE — ED Notes (Signed)
ED Provider at bedside. 

## 2020-12-04 NOTE — ED Triage Notes (Addendum)
Pt states he has been SOB  with some wheezing x 2 days. States he has been outside "a lot" this past week. States he has been using his inhaler but that it is not helping.

## 2020-12-04 NOTE — ED Provider Notes (Signed)
[facility] EMERGENCY DEPARTMENT Provider Note  CSN: [id number] Arrival date & time: 12/04/20 0153    History Chief Complaint  Patient presents with  . Shortness of Breath    HPI  William Kemp is a 60 y.o. male with history of afib, DM, HTN and bronchitis/COPD reports he had a week of wheezing and feeling SOB. Minimal cough and no fever. He has some chest soreness he attributes to the wheezing. He has been taking benadryl because he felt like his symptoms were due to the pollen. He has had some relief with his home inhaler. Has not seen his PCP.    Past Medical History:  Diagnosis Date  . Arthritis   . Diabetes mellitus (HCC) 06/12/2020  . Hypertension   . Prediabetes     History reviewed. No pertinent surgical history.  History reviewed. No pertinent family history.  Social History   Tobacco Use  . Smoking status: Never Smoker  . Smokeless tobacco: Never Used  Vaping Use  . Vaping Use: Never used  Substance Use Topics  . Alcohol use: No  . Drug use: No     Home Medications Prior to Admission medications   Medication Sig Start Date End Date Taking? Authorizing Provider  predniSONE (STERAPRED UNI-PAK 21 TAB) 10 MG (21) TBPK tablet 10mg  Tabs, 6 day taper. Use as directed 12/04/20  Yes 12/06/20, MD  albuterol (VENTOLIN HFA) 108 (90 Base) MCG/ACT inhaler Inhale into the lungs every 6 (six) hours as needed for wheezing or shortness of breath.    [provider]  allopurinol (ZYLOPRIM) 300 MG tablet Take 300 mg by mouth daily. 06/04/20   [provider]  baclofen (LIORESAL) 10 MG tablet Take 10 mg by mouth 3 (three) times daily. 03/20/20   [provider]  BREO ELLIPTA 100-25 MCG/INH AEPB Inhale 1 puff into the lungs daily. 04/14/20   [provider]  carvedilol (COREG) 25 MG tablet  02/17/20   [provider]  chlorthalidone (HYGROTON) 25 MG tablet Take 25 mg by mouth daily. 05/15/20   [provider]  ELIQUIS  5 MG TABS tablet Take 5 mg by mouth 2 (two) times daily. 03/28/20   [provider]  etodolac (LODINE) 400 MG tablet Take 400 mg by mouth 2 (two) times daily.    [provider]  gabapentin (NEURONTIN) 600 MG tablet Take 600 mg by mouth 3 (three) times daily. 05/25/20   [provider]  hydrALAZINE (APRESOLINE) 100 MG tablet Take 100 mg by mouth 2 (two) times daily. 06/11/20   [provider]  HYDROcodone-acetaminophen (NORCO/VICODIN) 5-325 MG tablet Take 1 tablet by mouth every 4 (four) hours as needed. 05/31/20   06/02/20, PA-C  LINZESS 145 MCG CAPS capsule Take 145 mcg by mouth daily. 05/15/20   [provider]  losartan (COZAAR) 100 MG tablet Take by mouth. 01/02/18   [provider]  OZEMPIC, 0.25 OR 0.5 MG/DOSE, 2 MG/1.5ML SOPN Inject into the skin. 04/14/20   [provider]  potassium chloride (KLOR-CON) 8 MEQ tablet Take 8 mEq by mouth daily. 05/22/20   [provider]  tadalafil (CIALIS) 20 MG tablet Take by mouth. 05/18/20   [provider]  tamsulosin (FLOMAX) 0.4 MG CAPS capsule Take 0.4 mg by mouth daily. 05/30/20   [provider]     Allergies    Codeine and Tramadol   Review of Systems   Review of Systems A comprehensive review of systems was  completed and negative except as noted in HPI.    Physical Exam BP (!) 147/78   Pulse (!) 59   Temp 98.1 F (36.7 C) (Oral)   Resp 16   Ht 6' (1.829 m)   Wt (!) 159 kg   SpO2 99%   BMI 47.54 kg/m   Physical Exam Vitals and nursing note reviewed.  Constitutional:      Appearance: Normal appearance. He is obese.  HENT:     Head: Normocephalic and atraumatic.     Nose: Nose normal.     Mouth/Throat:     Mouth: Mucous membranes are moist.  Eyes:     Extraocular Movements: Extraocular movements intact.     Conjunctiva/sclera: Conjunctivae normal.  Cardiovascular:     Rate and Rhythm: Normal rate.  Pulmonary:     Effort:  Pulmonary effort is normal.     Breath sounds: Normal breath sounds.  Abdominal:     General: Abdomen is flat.     Palpations: Abdomen is soft.     Tenderness: There is no abdominal tenderness.  Musculoskeletal:        General: No swelling. Normal range of motion.     Cervical back: Neck supple.     Right lower leg: Edema (trace) present.     Left lower leg: Edema (trace) present.  Skin:    General: Skin is warm and dry.  Neurological:     General: No focal deficit present.     Mental Status: He is alert.  Psychiatric:        Mood and Affect: Mood normal.      ED Results / Procedures / Treatments   Labs (all labs ordered are listed, but only abnormal results are displayed) Labs Reviewed  BASIC METABOLIC PANEL - Abnormal; Notable for the following components:      Result Value   Glucose, Bld 120 (*)    All other components within normal limits  CBC WITH DIFFERENTIAL/PLATELET - Abnormal; Notable for the following components:   RBC 4.15 (*)    All other components within normal limits  TROPONIN I (HIGH SENSITIVITY)    EKG EKG Interpretation  Date/Time:  Monday December 04 2020 02:29:25 EDT Ventricular Rate:  57 PR Interval:  178 QRS Duration: 107 QT Interval:  428 QTC Calculation: 417 R Axis:   -28 Text Interpretation: Sinus rhythm Borderline left axis deviation Low voltage, precordial leads Abnormal R-wave progression, early transition No old tracing to compare Confirmed by Susy Frizzle [phone]) on 12/04/2020 3:30:48 AM   Radiology DG Chest Portable 1 View  Result Date: 12/04/2020 CLINICAL DATA:  Shortness of breath. EXAM: PORTABLE CHEST 1 VIEW COMPARISON:  Chest x-ray 11/11/2014 report without imaging FINDINGS: The heart size and mediastinal contours are within normal limits. No focal consolidation. Slightly increased interstitial markings with no overt pulmonary edema. No pleural effusion. No pneumothorax. No acute osseous abnormality. IMPRESSION: No active disease.  Electronically Signed   By: Tish Frederickson M.D.   On: 12/04/2020 03:00    Procedures Procedures  Medications Ordered in the ED Medications  predniSONE (DELTASONE) tablet 60 mg (60 mg Oral Given 12/04/20 0405)     MDM Rules/Calculators/A&P MDM Patient without wheezing or respiratory distress on exam. No hypoxia on room air. EKG without ischemic changes and CXR is clear. Will check CBC, BMP and Trop given his vague complaint of chest soreness. Begin prednisone for likely allergic bronchitis.  ED Course  I have reviewed the triage vital signs and the nursing  notes.  Pertinent labs & imaging results that were available during my care of the patient were reviewed by me and considered in my medical decision making (see chart for details).  Clinical Course as of 12/04/20 0528  Mon Dec 04, 2020  0427 CBC is normal.  [CS]  0502 BMP is normal. Trop is neg.  [CS]  0526 Patient is feeling better. Labs and CXR are unremarkable. Plan discharge home with short course of prednisone. Will give a spacer for his albuterol inhaler and recommend close PCP follow up if not improving. Patient advised the steroids may cause his glucose to be elevated. He is usually well controlled but will watch it closely.  [CS]    Clinical Course User Index [CS] Pollyann Savoy, MD    Final Clinical Impression(s) / ED Diagnoses Final diagnoses:  Bronchitis, allergic, unspecified asthma severity, with acute exacerbation    Rx / DC Orders ED Discharge Orders         Ordered    predniSONE (STERAPRED UNI-PAK 21 TAB) 10 MG (21) TBPK tablet        12/04/20 0528           Pollyann Savoy, MD 12/04/20 [phone]

## 2020-12-11 DIAGNOSIS — Z7682 Awaiting organ transplant status: Principal | ICD-10-CM

## 2020-12-11 DIAGNOSIS — K7210 Chronic hepatic failure without coma: Principal | ICD-10-CM

## 2020-12-14 ENCOUNTER — Ambulatory Visit
Admit: 2020-12-14 | Discharge: 2020-12-22 | Disposition: A | Discharge status: Home Health | Payer: PRIVATE HEALTH INSURANCE | Source: Other Acute Inpatient Hospital | Admitting: Student in an Organized Health Care Education/Training Program

## 2020-12-18 DIAGNOSIS — K7210 Chronic hepatic failure without coma: Principal | ICD-10-CM

## 2020-12-18 DIAGNOSIS — Z7682 Awaiting organ transplant status: Principal | ICD-10-CM

## 2020-12-22 MED ORDER — FUROSEMIDE 20 MG TABLET
ORAL_TABLET | Freq: Every day | ORAL | 0 refills | 30 days | Status: CP
Start: 2020-12-22 — End: 2021-01-21

## 2020-12-22 MED ORDER — HYDROCORTISONE 5 MG TABLET
ORAL_TABLET | 0 refills | 0 days | Status: CP
Start: 2020-12-22 — End: ?

## 2020-12-22 MED ORDER — SOLU-CORTEF ACT-O-VIAL (PF) 250 MG/2 ML SOLUTION FOR INJECTION
Freq: Once | INTRAVENOUS | 0 refills | 0 days | Status: CP | PRN
Start: 2020-12-22 — End: ?

## 2020-12-23 MED ORDER — CIPROFLOXACIN 500 MG TABLET
ORAL_TABLET | ORAL | 0 refills | 30 days | Status: CP
Start: 2020-12-23 — End: ?

## 2020-12-23 MED ORDER — PANTOPRAZOLE 40 MG TABLET,DELAYED RELEASE
ORAL_TABLET | Freq: Every day | ORAL | 0 refills | 30 days | Status: CP
Start: 2020-12-23 — End: 2021-01-22

## 2020-12-25 DIAGNOSIS — Z7682 Awaiting organ transplant status: Principal | ICD-10-CM

## 2020-12-25 DIAGNOSIS — K7210 Chronic hepatic failure without coma: Principal | ICD-10-CM

## 2020-12-29 ENCOUNTER — Encounter: Admit: 2020-12-29 | Discharge: 2020-12-30 | Payer: PRIVATE HEALTH INSURANCE

## 2020-12-29 DIAGNOSIS — D72829 Elevated white blood cell count, unspecified: Principal | ICD-10-CM

## 2021-01-01 DIAGNOSIS — E119 Type 2 diabetes mellitus without complications: Secondary | ICD-10-CM

## 2021-01-01 DIAGNOSIS — G4733 Obstructive sleep apnea (adult) (pediatric): Secondary | ICD-10-CM | POA: Insufficient documentation

## 2021-01-01 DIAGNOSIS — E7800 Pure hypercholesterolemia, unspecified: Secondary | ICD-10-CM | POA: Insufficient documentation

## 2021-01-01 DIAGNOSIS — G629 Polyneuropathy, unspecified: Secondary | ICD-10-CM | POA: Insufficient documentation

## 2021-01-01 DIAGNOSIS — K5900 Constipation, unspecified: Secondary | ICD-10-CM

## 2021-01-01 DIAGNOSIS — I482 Chronic atrial fibrillation, unspecified: Secondary | ICD-10-CM

## 2021-01-01 DIAGNOSIS — M1990 Unspecified osteoarthritis, unspecified site: Secondary | ICD-10-CM

## 2021-01-01 DIAGNOSIS — I10 Essential (primary) hypertension: Secondary | ICD-10-CM | POA: Insufficient documentation

## 2021-01-01 DIAGNOSIS — Z7682 Awaiting organ transplant status: Principal | ICD-10-CM

## 2021-01-01 DIAGNOSIS — K7210 Chronic hepatic failure without coma: Principal | ICD-10-CM

## 2021-01-01 HISTORY — DX: Polyneuropathy, unspecified: G62.9

## 2021-01-01 HISTORY — DX: Type 2 diabetes mellitus without complications: E11.9

## 2021-01-01 HISTORY — DX: Obstructive sleep apnea (adult) (pediatric): G47.33

## 2021-01-01 HISTORY — DX: Constipation, unspecified: K59.00

## 2021-01-01 HISTORY — DX: Unspecified osteoarthritis, unspecified site: M19.90

## 2021-01-01 HISTORY — DX: Chronic atrial fibrillation, unspecified: I48.20

## 2021-01-01 HISTORY — DX: Pure hypercholesterolemia, unspecified: E78.00

## 2021-01-08 DIAGNOSIS — K7210 Chronic hepatic failure without coma: Principal | ICD-10-CM

## 2021-01-08 DIAGNOSIS — Z7682 Awaiting organ transplant status: Principal | ICD-10-CM

## 2021-01-09 MED ORDER — HYDROCORTISONE 5 MG TABLET
ORAL_TABLET | 0 refills | 0 days | Status: CP
Start: 2021-01-09 — End: ?

## 2021-01-11 DIAGNOSIS — K652 Spontaneous bacterial peritonitis: Principal | ICD-10-CM

## 2021-01-11 MED ORDER — CIPROFLOXACIN 500 MG TABLET
ORAL_TABLET | ORAL | 0 refills | 30 days | Status: CP
Start: 2021-01-11 — End: ?

## 2021-01-11 MED ORDER — PANTOPRAZOLE 40 MG TABLET,DELAYED RELEASE
ORAL_TABLET | Freq: Every day | ORAL | 0 refills | 30 days | Status: CP
Start: 2021-01-11 — End: 2021-02-10

## 2021-01-15 DIAGNOSIS — K7210 Chronic hepatic failure without coma: Principal | ICD-10-CM

## 2021-01-15 DIAGNOSIS — Z7682 Awaiting organ transplant status: Principal | ICD-10-CM

## 2021-01-22 DIAGNOSIS — Z7682 Awaiting organ transplant status: Principal | ICD-10-CM

## 2021-01-22 DIAGNOSIS — K7210 Chronic hepatic failure without coma: Principal | ICD-10-CM

## 2021-01-23 DIAGNOSIS — K7210 Chronic hepatic failure without coma: Principal | ICD-10-CM

## 2021-01-29 DIAGNOSIS — K7210 Chronic hepatic failure without coma: Principal | ICD-10-CM

## 2021-01-29 DIAGNOSIS — Z7682 Awaiting organ transplant status: Principal | ICD-10-CM

## 2021-02-05 DIAGNOSIS — K7210 Chronic hepatic failure without coma: Principal | ICD-10-CM

## 2021-02-05 DIAGNOSIS — Z7682 Awaiting organ transplant status: Principal | ICD-10-CM

## 2021-02-08 ENCOUNTER — Ambulatory Visit: Admit: 2021-02-08 | Discharge: 2021-02-09 | Payer: MEDICARE

## 2021-02-08 DIAGNOSIS — M5416 Radiculopathy, lumbar region: Principal | ICD-10-CM

## 2021-02-08 DIAGNOSIS — M47816 Spondylosis without myelopathy or radiculopathy, lumbar region: Principal | ICD-10-CM

## 2021-02-08 DIAGNOSIS — K7210 Chronic hepatic failure without coma: Principal | ICD-10-CM

## 2021-02-09 ENCOUNTER — Ambulatory Visit: Admit: 2021-02-09 | Discharge: 2021-02-10 | Payer: MEDICARE

## 2021-02-09 DIAGNOSIS — M545 Low back pain, unspecified back pain laterality, unspecified chronicity, unspecified whether sciatica present: Principal | ICD-10-CM

## 2021-02-12 DIAGNOSIS — Z7682 Awaiting organ transplant status: Principal | ICD-10-CM

## 2021-02-12 DIAGNOSIS — K7210 Chronic hepatic failure without coma: Principal | ICD-10-CM

## 2021-02-19 DIAGNOSIS — K652 Spontaneous bacterial peritonitis: Principal | ICD-10-CM

## 2021-02-19 DIAGNOSIS — K7210 Chronic hepatic failure without coma: Principal | ICD-10-CM

## 2021-02-19 DIAGNOSIS — Z7682 Awaiting organ transplant status: Principal | ICD-10-CM

## 2021-02-20 MED ORDER — PANTOPRAZOLE 40 MG TABLET,DELAYED RELEASE
ORAL_TABLET | 0 refills | 0 days | Status: CP
Start: 2021-02-20 — End: ?

## 2021-02-20 MED ORDER — CIPROFLOXACIN 500 MG TABLET
ORAL_TABLET | 0 refills | 0 days | Status: CP
Start: 2021-02-20 — End: ?

## 2021-02-20 MED ORDER — HYDROCORTISONE 5 MG TABLET
ORAL_TABLET | 0 refills | 0 days | Status: CP
Start: 2021-02-20 — End: ?

## 2021-02-21 ENCOUNTER — Ambulatory Visit: Admit: 2021-02-21 | Discharge: 2021-02-21 | Payer: MEDICARE

## 2021-02-21 DIAGNOSIS — K7210 Chronic hepatic failure without coma: Principal | ICD-10-CM

## 2021-02-21 MED ORDER — CHLORHEXIDINE GLUCONATE 0.12 % MOUTHWASH
Freq: Two times a day (BID) | OROMUCOSAL | 1 refills | 14 days | Status: CP
Start: 2021-02-21 — End: 2021-03-07

## 2021-02-26 DIAGNOSIS — Z7682 Awaiting organ transplant status: Principal | ICD-10-CM

## 2021-02-26 DIAGNOSIS — K7210 Chronic hepatic failure without coma: Principal | ICD-10-CM

## 2021-03-05 ENCOUNTER — Ambulatory Visit: Admit: 2021-03-05 | Discharge: 2021-03-06 | Payer: MEDICARE

## 2021-03-05 DIAGNOSIS — Z7682 Awaiting organ transplant status: Principal | ICD-10-CM

## 2021-03-05 DIAGNOSIS — K7210 Chronic hepatic failure without coma: Principal | ICD-10-CM

## 2021-03-07 DIAGNOSIS — K7210 Chronic hepatic failure without coma: Principal | ICD-10-CM

## 2021-03-12 DIAGNOSIS — Z7682 Awaiting organ transplant status: Principal | ICD-10-CM

## 2021-03-12 DIAGNOSIS — K7210 Chronic hepatic failure without coma: Principal | ICD-10-CM

## 2021-03-19 DIAGNOSIS — Z7682 Awaiting organ transplant status: Principal | ICD-10-CM

## 2021-03-19 DIAGNOSIS — K7210 Chronic hepatic failure without coma: Principal | ICD-10-CM

## 2021-03-26 DIAGNOSIS — K7210 Chronic hepatic failure without coma: Principal | ICD-10-CM

## 2021-04-02 DIAGNOSIS — K7210 Chronic hepatic failure without coma: Principal | ICD-10-CM

## 2021-04-03 MED ORDER — PANTOPRAZOLE 40 MG TABLET,DELAYED RELEASE
ORAL_TABLET | 0 refills | 0 days | Status: CP
Start: 2021-04-03 — End: ?

## 2021-04-05 ENCOUNTER — Ambulatory Visit: Admit: 2021-04-05 | Discharge: 2021-04-06 | Payer: MEDICARE

## 2021-04-06 ENCOUNTER — Telehealth: Admit: 2021-04-06 | Discharge: 2021-04-07 | Payer: MEDICARE

## 2021-04-08 DIAGNOSIS — K7469 Other cirrhosis of liver: Principal | ICD-10-CM

## 2021-04-09 DIAGNOSIS — K7210 Chronic hepatic failure without coma: Principal | ICD-10-CM

## 2021-04-16 DIAGNOSIS — K7210 Chronic hepatic failure without coma: Principal | ICD-10-CM

## 2021-04-19 MED ORDER — HYDROCORTISONE 5 MG TABLET
ORAL_TABLET | 0 refills | 0 days | Status: CP
Start: 2021-04-19 — End: ?

## 2021-04-23 DIAGNOSIS — K7210 Chronic hepatic failure without coma: Principal | ICD-10-CM

## 2021-04-30 DIAGNOSIS — K7210 Chronic hepatic failure without coma: Principal | ICD-10-CM

## 2021-05-07 DIAGNOSIS — K7210 Chronic hepatic failure without coma: Principal | ICD-10-CM

## 2021-05-09 ENCOUNTER — Ambulatory Visit: Admit: 2021-05-09 | Discharge: 2021-05-10 | Payer: MEDICARE

## 2021-05-09 DIAGNOSIS — K7210 Chronic hepatic failure without coma: Principal | ICD-10-CM

## 2021-05-09 DIAGNOSIS — Z7682 Awaiting organ transplant status: Principal | ICD-10-CM

## 2021-05-14 ENCOUNTER — Ambulatory Visit: Admit: 2021-05-14 | Discharge: 2021-05-15 | Payer: MEDICARE

## 2021-05-14 DIAGNOSIS — K7210 Chronic hepatic failure without coma: Principal | ICD-10-CM

## 2021-05-14 DIAGNOSIS — Z7682 Awaiting organ transplant status: Principal | ICD-10-CM

## 2021-05-21 DIAGNOSIS — K7210 Chronic hepatic failure without coma: Principal | ICD-10-CM

## 2021-05-22 DIAGNOSIS — K7210 Chronic hepatic failure without coma: Principal | ICD-10-CM

## 2021-05-28 DIAGNOSIS — K7210 Chronic hepatic failure without coma: Principal | ICD-10-CM

## 2021-05-29 ENCOUNTER — Ambulatory Visit
Admit: 2021-05-29 | Discharge: 2021-06-02 | Disposition: A | Discharge status: Home Health | Payer: MEDICARE | Admitting: Student in an Organized Health Care Education/Training Program

## 2021-05-29 ENCOUNTER — Encounter
Admit: 2021-05-29 | Discharge: 2021-06-02 | Disposition: A | Discharge status: Home / Self Care | Payer: MEDICARE | Admitting: Student in an Organized Health Care Education/Training Program

## 2021-05-29 ENCOUNTER — Ambulatory Visit
Admit: 2021-05-29 | Discharge: 2021-06-02 | Disposition: A | Discharge status: Home / Self Care | Payer: MEDICARE | Admitting: Student in an Organized Health Care Education/Training Program

## 2021-06-04 DIAGNOSIS — K7210 Chronic hepatic failure without coma: Principal | ICD-10-CM

## 2021-06-05 ENCOUNTER — Encounter: Admit: 2021-06-05 | Discharge: 2021-06-06 | Payer: MEDICARE

## 2021-06-05 ENCOUNTER — Ambulatory Visit: Admit: 2021-06-05 | Discharge: 2021-06-06 | Payer: MEDICARE

## 2021-06-05 ENCOUNTER — Other Ambulatory Visit: Admit: 2021-06-05 | Discharge: 2021-06-05 | Payer: MEDICARE

## 2021-06-05 ENCOUNTER — Ambulatory Visit
Admit: 2021-06-05 | Discharge: 2021-06-05 | Payer: MEDICARE | Attending: Student in an Organized Health Care Education/Training Program | Primary: Student in an Organized Health Care Education/Training Program

## 2021-06-05 DIAGNOSIS — K7210 Chronic hepatic failure without coma: Principal | ICD-10-CM

## 2021-06-05 DIAGNOSIS — E2740 Unspecified adrenocortical insufficiency: Principal | ICD-10-CM

## 2021-06-05 MED ORDER — INSULIN SYRINGE U-100 WITH NEEDLE 1 ML 31 GAUGE X 5/16" (8 MM)
0 refills | 0 days | Status: SS
Start: 2021-06-05 — End: ?

## 2021-06-05 MED ORDER — FLASH GLUCOSE SENSOR KIT
5 refills | 28 days | Status: SS
Start: 2021-06-05 — End: 2021-11-20

## 2021-06-05 MED ORDER — INSULIN GLARGINE 100 UNIT-LIXISENATIDE 33 MCG/ML SUBCUTANEOUS PEN
Freq: Every evening | SUBCUTANEOUS | 4 refills | 28.00000 days | Status: SS
Start: 2021-06-05 — End: ?

## 2021-06-06 DIAGNOSIS — U071 COVID-19: Principal | ICD-10-CM

## 2021-06-06 MED ORDER — NIRMATRELVIR 300 MG (150 MG X2)-RITONAVIR 100 MG TABLET,DOSE PACK(EUA)
ORAL_TABLET | 0 refills | 0 days | Status: CP
Start: 2021-06-06 — End: ?

## 2021-06-06 MED ORDER — SYRINGE WITH NEEDLE 3 ML 23 X 1"
0 refills | 0.00000 days | Status: CP
Start: 2021-06-06 — End: ?

## 2021-06-08 DIAGNOSIS — K7210 Chronic hepatic failure without coma: Principal | ICD-10-CM

## 2021-06-11 DIAGNOSIS — K7210 Chronic hepatic failure without coma: Principal | ICD-10-CM

## 2021-06-18 ENCOUNTER — Institutional Professional Consult (permissible substitution): Admit: 2021-06-18 | Discharge: 2021-06-18 | Payer: MEDICARE

## 2021-06-18 DIAGNOSIS — E2740 Unspecified adrenocortical insufficiency: Principal | ICD-10-CM

## 2021-06-18 DIAGNOSIS — K7210 Chronic hepatic failure without coma: Principal | ICD-10-CM

## 2021-06-21 ENCOUNTER — Ambulatory Visit
Admit: 2021-06-21 | Discharge: 2021-06-24 | Disposition: A | Discharge status: Home / Self Care | Payer: MEDICARE | Admitting: Internal Medicine

## 2021-06-22 MED ORDER — RIFAXIMIN 550 MG TABLET
ORAL_TABLET | Freq: Two times a day (BID) | ORAL | 0 refills | 30 days
Start: 2021-06-22 — End: 2021-06-22

## 2021-06-24 DIAGNOSIS — K7460 Unspecified cirrhosis of liver: Principal | ICD-10-CM

## 2021-06-24 DIAGNOSIS — Z7682 Awaiting organ transplant status: Principal | ICD-10-CM

## 2021-06-24 DIAGNOSIS — G9340 Encephalopathy, unspecified: Principal | ICD-10-CM

## 2021-06-24 MED ORDER — RIFAXIMIN 550 MG TABLET
ORAL_TABLET | Freq: Two times a day (BID) | ORAL | 11 refills | 30.00000 days | Status: CP
Start: 2021-06-24 — End: ?
  Filled 2021-06-29: qty 60, 30d supply, fill #0

## 2021-06-24 MED ORDER — POLYETHYLENE GLYCOL 3350 17 GRAM ORAL POWDER PACKET
PACK | Freq: Every day | ORAL | 0 refills | 30.00000 days | Status: CP
Start: 2021-06-24 — End: ?

## 2021-06-25 ENCOUNTER — Ambulatory Visit: Admit: 2021-06-25 | Discharge: 2021-06-26 | Payer: MEDICARE

## 2021-06-25 DIAGNOSIS — K7460 Unspecified cirrhosis of liver: Principal | ICD-10-CM

## 2021-06-25 DIAGNOSIS — K7210 Chronic hepatic failure without coma: Principal | ICD-10-CM

## 2021-06-25 DIAGNOSIS — G9340 Encephalopathy, unspecified: Principal | ICD-10-CM

## 2021-06-25 DIAGNOSIS — R54 Age-related physical debility: Principal | ICD-10-CM

## 2021-06-25 DIAGNOSIS — Z7682 Awaiting organ transplant status: Principal | ICD-10-CM

## 2021-06-27 DIAGNOSIS — K7210 Chronic hepatic failure without coma: Principal | ICD-10-CM

## 2021-06-27 MED ORDER — HYDROCORTISONE 5 MG TABLET
ORAL_TABLET | 1 refills | 0 days | Status: CP
Start: 2021-06-27 — End: ?

## 2021-06-28 DIAGNOSIS — K7210 Chronic hepatic failure without coma: Principal | ICD-10-CM

## 2021-06-28 NOTE — Unmapped (Signed)
[facility] Specialty Medication Onboarding    Specialty Medication: XIFAXAN 550 mg Tab (rifAXIMin)  Prior Authorization: Approved   Financial Assistance: No - copay  <$25  Final Copay/Day Supply: $9.85 / 30    Insurance Restrictions: None     Notes to Pharmacist: n/a    The triage team has completed the benefits investigation and has determined that the patient is able to fill this medication at [facility]. Please contact the patient to complete the onboarding or follow up with the prescribing physician as needed.

## 2021-06-28 NOTE — Unmapped (Signed)
[facility] orders.

## 2021-06-29 NOTE — Unmapped (Addendum)
[facility]   Patient Onboarding/Medication Counseling    Matthew Key is a 60 y.o. male with hepatic encephalopathy who I am counseling today on initiation of therapy.  I am speaking to the patient.    Was a Nurse, learning disability used for this call? No    Verified patient's date of birth / HIPAA.    Specialty medication(s) to be sent: Infectious Disease: Xifaxan      Non-specialty medications/supplies to be sent: n/a      Medications not needed at this time: n/a         Xifaxan (rifaximin) 550mg  tablets    Medication & Administration     Dosage: Hepatic Encephalopathy Prevention: Take one 550mg  tablet by mouth twice daily.    Administration: Take with or without food.    Adherence/Missed dose instructions:   ??? Take missed dose as soon as you remember. If it is close to the time of your next dose, skip the missed dose and resume with your next scheduled dose.  ??? Do not take extra doses or 2 doses at the same time.    Goals of Therapy     Hepatic Encephalopathy: The goal is to reduce risk of overt hepatic encephalopathy recurrence.    Side Effects & Monitoring Parameters     Common Side Effects:   ??? Peripheral edema  ??? Nausea  ??? Dizziness  ??? Fatigue  ??? Ascites    The following side effects should be reported to the provider:  ?? Signs of an allergic reaction, such as rash; hives; itching; red, swollen, blistered, or peeling skin with or without fever. If you have wheezing; tightness in the chest or throat; trouble breathing, swallowing, or talking; unusual hoarseness; or swelling of the mouth, face, lips, tongue, or throat, call 911 or go to the closest emergency department (ED).   ?? Swelling in the arms, legs or stomach.   ?? Feeling very tired or weak.  ?? Low mood (depression).   ?? Fever.   ?? Diarrhea is common with antibiotics. Rarely, a severe form called C diff-associated diarrhea (CDAD) may happen. Sometimes this has led to a deadly bowel problem (colitis). CDAD may happen during or a few months after taking antibiotics. Call your doctor right away if you have stomach pain, cramps, or very loose, watery, or bloody stools. Check with your doctor before treating diarrhea.    Monitoring Parameters:   ??? For the prevention of hepatic encephalopathy:  Patient should monitor for changes in mental status.         Contraindications, Warnings, & Precautions     ??? Superinfection: Prolonged use may result in fungal or bacterial superinfection, including Clostridioides (formerly Clostridium) difficile-associated diarrhea (CDAD) and pseudomembranous colitis; CDAD has been observed >2 months post-antibiotic treatment.  ??? Severe (Child Pugh Class C) Hepatic Impairment: increased systemic exposure with severe hepatic impairment.  ??? Concomitant use with P-glycoprotein (P-gp) inhibitors: P-gp inhibitors may increase systemic exposure of rifaximin.    Drug/Food Interactions     ??? Medication list reviewed in Epic. The patient was instructed to inform the care team before taking any new medications or supplements. No drug interactions identified.       Storage, Handling Precautions, & Disposal     ??? Store this medication at room temperature.  ??? Store in a dry place. Do not store in a bathroom.   ??? Keep all drugs out of the reach of children and pets.  ??? Throw away unused or expired drugs.  Do not flush down a toilet or pour down a drain unless you are told to do so. Check with your pharmacist if you have questions about the best way to throw out drugs. There may be drug take-back programs in your area.        Current Medications (including OTC/herbals), Comorbidities and Allergies     Current Outpatient Medications   Medication Sig Dispense Refill   ??? atorvastatin (LIPITOR) 10 MG tablet Take 10 mg by mouth nightly.     ??? ciprofloxacin HCl (CIPRO) 500 MG tablet TAKE 1 TABLET BY MOUTH IN THE MORNING 30 tablet 0   ??? flash glucose sensor kit 1 each by Other route every fourteen (14) days. Insert 1 sensor SQ q14 days; give #2 (30 day supply) or #6 (90 day supply) per pt preference 2 each 5   ??? furosemide (LASIX) 20 MG tablet Take 2 tablets (40 mg total) by mouth daily. 60 tablet 0   ??? gabapentin (NEURONTIN) 400 MG capsule Take 400 mg by mouth two (2) times a day.     ??? hydrocortisone (CORTEF) 5 MG tablet Take 10 mg (2 tablets) in morning as needed on days you feel sick. If you develop severe lightheadedness or severe nausea/vomiting you should be seen in the ER. 90 tablet 1   ??? insulin syringe-needle U-100 1 mL 31 gauge x 5/16 (8 mm) Syrg Use once as needed for Hydrocortisone injection.. 10 each 0   ??? lactulose (CHRONULAC) 10 gram/15 mL (15 mL) Soln Take 30 mL by mouth Two (2) times a day.      ??? lisinopriL (PRINIVIL,ZESTRIL) 2.5 MG tablet Take 2.5 mg by mouth daily.     ??? pantoprazole (PROTONIX) 40 MG tablet TAKE 1 TABLET BY MOUTH IN THE MORNING 30 tablet 0   ??? polyethylene glycol (MIRALAX) 17 gram packet Take 17 g by mouth daily. 30 packet 0   ??? rifAXIMin (XIFAXAN) 550 mg Tab Take 1 tablet (550 mg total) by mouth Two (2) times a day. 60 tablet 11   ??? semaglutide (OZEMPIC) 1 mg/dose (4 mg/3 mL) PnIj injection Inject 1 mg under the skin every seven (7) days.     ??? spironolactone (ALDACTONE) 25 MG tablet Take 50 mg by mouth daily.     ??? syringe with needle 3 mL 23 x 1 Syrg Use as needed for Hydrocortisone injection 1 each 0   ??? traZODone (DESYREL) 50 MG tablet Take 100 mg by mouth nightly.  (Patient not taking: Reported on 06/29/2021)       No current facility-administered medications for this visit.       Allergies   Allergen Reactions   ??? Venom-Honey Bee Swelling       Patient Active Problem List   Diagnosis   ??? Hyponatremia   ??? Leukocytosis   ??? Non-alcoholic micronodular cirrhosis of liver (CMS-HCC)   ??? Pleural effusion, right   ??? Primary insomnia   ??? Obstructive sleep apnea on CPAP   ??? Thrombocytopenia, unspecified (CMS-HCC)   ??? Type 2 diabetes mellitus with diabetic neuropathy, with long-term current use of insulin (CMS-HCC)   ??? Spontaneous bacterial peritonitis (CMS-HCC)   ??? Anemia   ??? Left upper extremity swelling   ??? Adrenal insufficiency (CMS-HCC)   ??? Acute deep vein thrombosis (DVT) of brachial vein of left upper extremity (CMS-HCC)   ??? Acute thrombosis of right basilic vein   ??? Hypofibrinogenemia (CMS-HCC)   ??? Abdominal ascites   ??? Pleural effusion associated with  hepatic disorder   ??? Hypertension       Reviewed and up to date in Epic.    Appropriateness of Therapy     Acute infections noted within Epic:  No active infections  Patient reported infection: None    Is medication and dose appropriate based on diagnosis and infection status? Yes    Prescription has been clinically reviewed: Yes      Baseline Quality of Life Assessment      How many days over the past month did your hepatic encephalopathy  keep you from your normal activities? For example, brushing your teeth or getting up in the morning. 2    Financial Information     Medication Assistance provided: Prior Authorization    Anticipated copay of $9.85 reviewed with patient. Verified delivery address.    Delivery Information     Scheduled delivery date: 06/30/21    Expected start date: 06/30/21    Medication will be delivered via UPS to the prescription address in [facility].  This shipment will not require a signature.      Explained the services we provide at [facility] and that each month we would call to set up refills.  Stressed importance of returning phone calls so that we could ensure they receive their medications in time each month.  Informed patient that we should be setting up refills 7-10 days prior to when they will run out of medication.  A pharmacist will reach out to perform a clinical assessment periodically.  Informed patient that a welcome packet, containing information about our pharmacy and other support services, a Notice of Privacy Practices, and a drug information handout will be sent.      The patient or caregiver noted above participated in the development of this care plan and knows that they can request review of or adjustments to the care plan at any time.      Patient or caregiver verbalized understanding of the above information as well as how to contact the pharmacy at [phone] option 4 with any questions/concerns.  The pharmacy is open Monday through Friday 8:30am-4:30pm.  A pharmacist is available 24/7 via pager to answer any clinical questions they may have.    Patient Specific Needs     - Does the patient have any physical, cognitive, or cultural barriers? No    - Does the patient have adequate living arrangements? (i.e. the ability to store and take their medication appropriately) Yes    - Did you identify any home environmental safety or security hazards? No    - Patient prefers to have medications discussed with  patient or his wife Thoren Hosang     - Is the patient or caregiver able to read and understand education materials at a high school level or above? Yes    - Patient's primary language is  English     - Is the patient high risk? No    - Does the patient require physician intervention or other additional services (i.e. dietary/nutrition, smoking cessation, social work)? No      Roderic Palau  [facility] Specialty Pharmacist

## 2021-07-01 NOTE — Unmapped (Signed)
Endocrinology Clinic Follow Up Note    ASSESSMENT AND PLAN:     1. Type 2 diabetes, uncontrolled without any known complications: HbA1c most recently 8.5% (05/2021), previously 7.6% (02/2021). BGs running higher over past few weeks including as high as 400s on BMP post-discharge after stopping insulin but only 220s today in clinic despite only being on Ozempic. Suspect stopping supraphysiologic Hydrocortisone has helped significantly. Goal HbA1c 7.0%.    - Currently Ozempic 1mg  weekly per outside endocrinologist, Gavin Pound, NP although he states he would prefer to continue to follow-up with [place]  - He was sent prescription for Tresiba 60 units but did not realize this until recently, recommended he start this [place] only 20 units at bedtime (higher insulin needs previously likely related to being on supraphysiologic Hydrocortisone) and uptitrate by 2 units every 3-4 days to achieve BGs < 160s  - S/p cosyntropin stim test (8.4 -> 22.9) in 06/2021, stopped daily Hydrocortisone, only take Hydrocortisone 10mg  PRN illness  - Metformin contraindicated due to 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; may be candidate for SGLT-2 if not able to stop Hydrocortisone and BGs remain high; will reach out to Hepatology prior to starting if it is needed  - He has [employer] CGM supplies at home but has not been able to set up yet  - RTC in 3-4 weeks with CDE to titrate insulin further and set up CGM and 2-3 months with me  - Sent one-time prescription for Pantoprazole, recommended he reach out to Hepatology for further refills     Diabetes-related complications:   - Retinopathy: Last eye exam in around 02/2021, normal per patient  - Neuropathy: No symptoms, foot exam completed 05/2021, discussed proper foot care  - Nephropathy: Last urine MA/C N/A and GFR 69 (05/2021), urine MA/C not ordered as unlikely to change management  > on Lisinopril 2.5mg   - Lipids/ASCVD risk: Last lipids > 1 year ago, repeat not ordered today as unlikely to change management  > on Atorvastatin 10mg   - HTN: Well-controlled, on Lisinopril 2.5mg , Lasix 20mg , Spironolactone 50mg   - Immunizations: S/p COVID vaccine    2. 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 No s/sxs of residual adrenal insufficiency that would suggest underreplacement.     - S/p cosyntropin stim test (8.4 -> 22.9) in 06/2021, stopped daily Hydrocortisone, only take Hydrocortisone 10mg  PRN illness    Patient was discussed with Dr. Maple Hudson.    Arelia Longest, M.D. PGY-5 Endocrinology Fellow  [facility] at [place]  Phone:  [phone]     Fax:  [phone]    SUBJECTIVE:     Matthew Key is a 60 y.o. male with a PMHx of T2DM, AI, cirrhosis, CVA, and OSA on CPAP who is seen in follow-up regarding T2DM. I last saw him in 05/2021.    He has had several recent admissions to [facility] in 05/2021 & 06/2021 for ascites and/or HE.  He is awaiting liver transplant and states he is active on transplant list. After his last appointment, he was called in for a transplant for discharged after (+)COVID.    Regarding his diabetes, he was diagnosed 5 years ago, in his 21s. He is currently on just Ozempic 1mg  weekly. He had been on [place] that was stopped after he was in the hospital last  month for hepatic encephalopathy. He was sent a prescription for Ozempic by Gavin Pound, NP his local endocrinologist. He prefers to follow-up with [place] as the rest of his providers are at [facility]. He has [employer] supplies but has not worn it since discharge and has also not checked his BGs.     He does not have any issues with hypoglycemia. He eats 3 meals a day. He does not have any blurry vision, numbness, or tingling. He is currently on Atorvastatin. He has history of stroke.    Regarding his history of adrenal insufficiency, he denies lightheadedness, nausea/vomiting, decreased appetite. He has not taken any Hydrocortisone since discharge and after I discussed with him after his normal stim test.    Medical History Surgical History   Past Medical History:   Diagnosis Date   ??? Arthritis    ??? Cirrhosis (CMS-HCC)    ??? History of transfusion    ??? Hypertension     under control with meds and weight loss   ??? Liver disease    ??? Stroke (CMS-HCC)     mild stroke   ??? Type 2 diabetes mellitus with diabetic neuropathy, with long-term current use of insulin (CMS-HCC) 06/09/2014      Past Surgical History:   Procedure Laterality Date   ??? KNEE SURGERY     ??? PR CATH PLACE/CORON ANGIO, IMG SUPER/INTERP,R&L HRT CATH, L HRT VENTRIC N/A 12/19/2020    Procedure: CATH LEFT/RIGHT HEART CATHETERIZATION;  Surgeon: Rosana Hoes, MD;  Location: [facility] CATH;  Service: Cardiology   ??? PR UPPER GI ENDOSCOPY,BIOPSY N/A 10/12/2020    Procedure: UGI ENDOSCOPY; WITH BIOPSY, SINGLE OR MULTIPLE;  Surgeon: Marene Lenz, MD;  Location: [facility];  Service: Gastroenterology          Social History Family History   Social History     Tobacco Use   ??? Smoking status: Never   ??? Smokeless tobacco: Never   Substance Use Topics   ??? Alcohol use: Not Currently     Comment: light drinker. No drink in over a year .      Family History   Problem Relation Age of Onset   ??? Alzheimer's disease Father    ??? Aortic dissection Brother           Medications     Current Outpatient Medications:   ???  atorvastatin (LIPITOR) 10 MG tablet, Take 10 mg by mouth nightly., Disp: , Rfl:   ???  ciprofloxacin HCl (CIPRO) 500 MG tablet, TAKE 1 TABLET BY MOUTH IN THE MORNING, Disp: 30 tablet, Rfl: 0  ???  flash glucose sensor kit, 1 each by Other route every fourteen (14) days. Insert 1 sensor SQ q14 days; give #2 (30 day supply) or #6 (90 day supply) per pt preference, Disp: 2 each, Rfl: 5  ???  furosemide (LASIX) 20 MG tablet, Take 2 tablets (40 mg total) by mouth daily., Disp: 60 tablet, Rfl: 0  ???  gabapentin (NEURONTIN) 400 MG capsule, Take 400 mg by mouth two (2) times a day., Disp: , Rfl:   ???  hydrocortisone (CORTEF) 5 MG tablet, Take 10 mg (2 tablets) in morning as needed on days you feel sick. If you develop severe lightheadedness or severe nausea/vomiting you should be seen in the ER., Disp: 90 tablet, Rfl: 1  ???  insulin syringe-needle U-100 1 mL 31 gauge x 5/16 (8 mm) Syrg, Use once as needed for Hydrocortisone injection.., Disp: 10 each, Rfl: 0  ???  lactulose (CHRONULAC) 10 gram/15 mL (15 mL) Soln, Take 30 mL by mouth Two (2) times a day. , Disp: , Rfl:   ???  lisinopriL (PRINIVIL,ZESTRIL) 2.5 MG tablet, Take 2.5 mg by mouth daily., Disp: , Rfl:   ???  polyethylene glycol (MIRALAX) 17 gram packet, Take 17 g by mouth daily., Disp: 30 packet, Rfl: 0  ???  rifAXIMin (XIFAXAN) 550 mg Tab, Take 1 tablet (550 mg total) by mouth Two (2) times a day., Disp: 60 tablet, Rfl: 11  ???  semaglutide (OZEMPIC) 1 mg/dose (4 mg/3 mL) PnIj injection, Inject 1 mg under the skin every seven (7) days., Disp: , Rfl:   ???  spironolactone (ALDACTONE) 25 MG tablet, Take 2 tablets (50 mg total) by mouth daily., Disp: 180 tablet, Rfl: 3  ???  syringe with needle 3 mL 23 x 1 Syrg, Use as needed for Hydrocortisone injection, Disp: 1 each, Rfl: 0  ???  pantoprazole (PROTONIX) 40 MG tablet, Take 1 tablet (40 mg total) by mouth daily., Disp: 30 tablet, Rfl: 0  ???  traZODone (DESYREL) 50 MG tablet, Take 100 mg by mouth nightly. (Patient not taking: Reported on 07/03/2021), Disp: , Rfl:          Allergies   Allergies   Allergen Reactions   ??? Venom-Honey Bee Swelling          Review of Systems: 10 systems reviewed and were negative except for as noted in HPI and below.    OBJECTIVE:     Physical Exam:  BP 99/61  - Pulse 88  - Ht 172.7 cm (5' 7.99)  - Wt 89.8 kg (198 lb)  - BMI 30.11 kg/m??   General: Slightly ill-appearing Caucasian male in no apparent distress  HEENT: EOMI, no exophthalmos, sclera anicteric, no thyromegaly, no cushingoid features  Cardiovascular: Regular rate and rhythm; no murmurs, rubs, or gallops; 2+ radial pulses  Respiratory: Clear to auscultation bilaterally; no increased work of breathing on RA  Abdominal: Soft, non-distended, no lipohypertrophy, some bruising  MSK: Normal station and gait; no edema  Neuro: Awake, alert, and oriented to person, place, and time; no focal deficits; no tremors; no asterixis  Psych: Normal mood and affect    Labs:  I personally reviewed labs available in Epic prior to the start of today's visit.    Labs are significant for:  Lab Results   Component Value Date    A1C 8.5 (H) 05/09/2021    A1C 7.6 (H) 03/05/2021    A1C 5.9 12/13/2019    A1C 4.9 05/20/2018     Lab Results   Component Value Date    TRIG 95 12/13/2019    CHOL 149 12/13/2019    HDL 63 12/13/2019    LDL 68 12/13/2019     Lab Results   Component Value Date    TSH 1.185 06/05/2021    FREET4 1.22 06/05/2021    FREET3 2.17 (L) 12/21/2020     Lab Results   Component Value Date    CREATININE 1.07 06/25/2021     Lab Results   Component Value Date    NA 130 (L) 06/25/2021    K 3.7 06/25/2021    CL 94 (L) 06/25/2021    CO2 28.5 06/25/2021    BUN 7 (L) 06/25/2021    CREATININE 1.07 06/25/2021    AST 52 (H) 06/25/2021    PROT 6.8 06/25/2021    ALBUMIN 2.9 (L) 06/25/2021     No results found for: FSH, LH  No results found for: ESTRADIOL  No results found for: TESTOSTERONE  No results found for: PROLACTIN  No components found for: ILGF1  Lab Results   Component Value Date    CORTISOL 22.9 06/18/2021     No components found for: ALDOSTERON  No results found for: PTH  No results found for: CA  Lab Results   Component Value Date    ALBUMIN 2.9 (L) 06/25/2021    ALBUMIN 2.3 (L) 06/24/2021    ALBUMIN 2.4 (L) 06/23/2021     Lab Results   Component Value Date    PHOS 3.0 06/05/2021    PHOS 3.1 05/09/2021    PHOS 2.9 12/18/2020     No components found for: VITD26    Imaging:  I personally reviewed imaging available in Epic prior to the start of today's visit.    Imaging is significant for: N/A    I reviewed and summarized (above) records in preparation for today's visit all pertinent notes in Epic/Media and CareEverywhere as well as any sent records.

## 2021-07-02 DIAGNOSIS — K7210 Chronic hepatic failure without coma: Principal | ICD-10-CM

## 2021-07-03 ENCOUNTER — Ambulatory Visit
Admit: 2021-07-03 | Discharge: 2021-07-03 | Payer: MEDICARE | Attending: Student in an Organized Health Care Education/Training Program | Primary: Student in an Organized Health Care Education/Training Program

## 2021-07-03 ENCOUNTER — Ambulatory Visit: Admit: 2021-07-03 | Discharge: 2021-07-03 | Payer: MEDICARE

## 2021-07-03 DIAGNOSIS — K7210 Chronic hepatic failure without coma: Principal | ICD-10-CM

## 2021-07-03 LAB — CBC
HEMATOCRIT: 41.4 % (ref 39.0–48.0)
HEMOGLOBIN: 14.2 g/dL (ref 12.9–16.5)
MEAN CORPUSCULAR HEMOGLOBIN CONC: 34.3 g/dL (ref 32.0–36.0)
MEAN CORPUSCULAR HEMOGLOBIN: 33.7 pg — ABNORMAL HIGH (ref 25.9–32.4)
MEAN CORPUSCULAR VOLUME: 98.5 fL — ABNORMAL HIGH (ref 77.6–95.7)
MEAN PLATELET VOLUME: 11 fL — ABNORMAL HIGH (ref 6.8–10.7)
PLATELET COUNT: 61 10*9/L — ABNORMAL LOW (ref 150–450)
RED BLOOD CELL COUNT: 4.2 10*12/L — ABNORMAL LOW (ref 4.26–5.60)
RED CELL DISTRIBUTION WIDTH: 15.7 % — ABNORMAL HIGH (ref 12.2–15.2)
WBC ADJUSTED: 7 10*9/L (ref 3.6–11.2)

## 2021-07-03 MED ORDER — PANTOPRAZOLE 40 MG TABLET,DELAYED RELEASE
ORAL_TABLET | Freq: Every day | ORAL | 0 refills | 30 days | Status: CP
Start: 2021-07-03 — End: ?

## 2021-07-03 MED ORDER — SPIRONOLACTONE 25 MG TABLET
ORAL_TABLET | Freq: Every day | ORAL | 3 refills | 90 days | Status: CP
Start: 2021-07-03 — End: 2022-07-03

## 2021-07-03 NOTE — Unmapped (Addendum)
Thank you for coming in to see [place] today!    Today we discussed your diabetes.    - Keep taking the the Ozempic 1mg  weekly.     - Start the [place] 20 units every morning. Every Wednesday and Sunday, look at your blood sugars for the past few days (from the first thing in the morning when you wake up) and increase by 2 units if your blood sugars are consistently >160. The maximum should be 36 units. If you reach 36 units and your blood sugars are still high, please let us know. Our phone number is [phone].    - If you start having low blood sugars (frequently below 70-80), let us know.    - We are getting labs today. I will reach out to you once all your lab results are back.    - Be sure to bring all of your medications and devices to your clinic appointments. This means all of your pill bottles as well as insulin pens or vials if you are taking insulin. You should also bring your glucometer (blood glucose meter) or blood sugar log if you have one.

## 2021-07-03 NOTE — Unmapped (Signed)
No Meter and pump downloaded. POC glucose and A1C done today. PP fasting. 226 mg/dL.

## 2021-07-03 NOTE — Unmapped (Signed)
Confirmed spironolactone dose with patient and sent in refill.

## 2021-07-04 MED ORDER — OZEMPIC 1 MG/DOSE (4 MG/3 ML) SUBCUTANEOUS PEN INJECTOR
SUBCUTANEOUS | 0 refills | 0 days
Start: 2021-07-04 — End: ?

## 2021-07-04 MED ORDER — INSULIN DEGLUDEC (U-100) 100 UNIT/ML (3 ML) SUBCUTANEOUS PEN
Freq: Every day | SUBCUTANEOUS | 12 refills | 0 days
Start: 2021-07-04 — End: ?

## 2021-07-04 NOTE — Unmapped (Signed)
Pt left voice mail to nurse line saying that he visited Dr. Melrose Nakayama yesterday. He supposedly should take tresiba 20 units in the morning twice a week and ozempic once a week.  Pt wants both Rxes to be sent to [facility].     Message sent to Dr. Melrose Nakayama.

## 2021-07-05 LAB — COMPREHENSIVE METABOLIC PANEL
A/G RATIO: 0.7 — ABNORMAL LOW (ref 1.2–2.2)
ALBUMIN: 2.7 g/dL — ABNORMAL LOW (ref 3.8–4.8)
ALKALINE PHOSPHATASE: 190 IU/L — ABNORMAL HIGH (ref 44–121)
ALT (SGPT): 28 IU/L (ref 0–44)
AST (SGOT): 51 IU/L — ABNORMAL HIGH (ref 0–40)
BILIRUBIN TOTAL: 5.7 mg/dL — ABNORMAL HIGH (ref 0.0–1.2)
BLOOD UREA NITROGEN: 9 mg/dL (ref 8–27)
BUN / CREAT RATIO: 9 — ABNORMAL LOW (ref 10–24)
CALCIUM: 8.8 mg/dL (ref 8.6–10.2)
CHLORIDE: 92 mmol/L — ABNORMAL LOW (ref 96–106)
CO2: 26 mmol/L (ref 20–29)
CREATININE: 1 mg/dL (ref 0.76–1.27)
GLOBULIN, TOTAL: 3.7 g/dL (ref 1.5–4.5)
GLUCOSE: 366 mg/dL — ABNORMAL HIGH (ref 70–99)
POTASSIUM: 4.1 mmol/L (ref 3.5–5.2)
SODIUM: 128 mmol/L — ABNORMAL LOW (ref 134–144)
TOTAL PROTEIN: 6.4 g/dL (ref 6.0–8.5)

## 2021-07-05 LAB — PROTIME-INR
INR: 1.5 — ABNORMAL HIGH (ref 0.9–1.2)
PROTHROMBIN TIME: 15.4 s — ABNORMAL HIGH (ref 9.1–12.0)

## 2021-07-06 DIAGNOSIS — K7469 Other cirrhosis of liver: Principal | ICD-10-CM

## 2021-07-09 DIAGNOSIS — K7210 Chronic hepatic failure without coma: Principal | ICD-10-CM

## 2021-07-09 MED ORDER — INSULIN DEGLUDEC (U-100) 100 UNIT/ML (3 ML) SUBCUTANEOUS PEN
Freq: Every day | SUBCUTANEOUS | 3 refills | 30.00000 days | Status: CP
Start: 2021-07-09 — End: ?

## 2021-07-09 MED ORDER — OZEMPIC 1 MG/DOSE (4 MG/3 ML) SUBCUTANEOUS PEN INJECTOR
SUBCUTANEOUS | 3 refills | 28 days | Status: CP
Start: 2021-07-09 — End: ?

## 2021-07-09 NOTE — Unmapped (Signed)
Called patient at [phone] to follow up on MyChart message stating high BGs.     Patient currently taking:  Tresiba 24 units daily     Patient states he will receive his ozempic in the mail tomorrow.    Patient uses Libre reader to scan Josephine Igo and therefore unable to connect to clinic Libreview account. Patient scanned BG while on the phone and is currrently 337.    Patient stating his BGs running consistently in the 200s-400s. Patient read off BGs from [facility] reader:     07/08/21   [id number]  0800 302  1036 274  1114 248  1220 234    07/09/21  0132 326  0355 348  2128 440  0719 255    Will get ozempic tomorrow through mail order pharmacy. Secure message sent to Dr. Melrose Nakayama.

## 2021-07-10 LAB — COMPREHENSIVE METABOLIC PANEL
A/G RATIO: 0.8 — ABNORMAL LOW (ref 1.2–2.2)
ALBUMIN: 2.8 g/dL — ABNORMAL LOW (ref 3.8–4.8)
ALKALINE PHOSPHATASE: 254 IU/L — ABNORMAL HIGH (ref 44–121)
ALT (SGPT): 32 IU/L (ref 0–44)
AST (SGOT): 53 IU/L — ABNORMAL HIGH (ref 0–40)
BILIRUBIN TOTAL: 4.6 mg/dL — ABNORMAL HIGH (ref 0.0–1.2)
BLOOD UREA NITROGEN: 10 mg/dL (ref 8–27)
BUN / CREAT RATIO: 10 (ref 10–24)
CALCIUM: 9.1 mg/dL (ref 8.6–10.2)
CHLORIDE: 93 mmol/L — ABNORMAL LOW (ref 96–106)
CO2: 23 mmol/L (ref 20–29)
CREATININE: 1.02 mg/dL (ref 0.76–1.27)
GLOBULIN, TOTAL: 3.5 g/dL (ref 1.5–4.5)
GLUCOSE: 363 mg/dL — ABNORMAL HIGH (ref 70–99)
POTASSIUM: 4 mmol/L (ref 3.5–5.2)
SODIUM: 130 mmol/L — ABNORMAL LOW (ref 134–144)
TOTAL PROTEIN: 6.3 g/dL (ref 6.0–8.5)

## 2021-07-10 LAB — CBC
HEMATOCRIT: 37.9 % (ref 37.5–51.0)
HEMOGLOBIN: 13.5 g/dL (ref 13.0–17.7)
MEAN CORPUSCULAR HEMOGLOBIN CONC: 35.6 g/dL (ref 31.5–35.7)
MEAN CORPUSCULAR HEMOGLOBIN: 33.7 pg — ABNORMAL HIGH (ref 26.6–33.0)
MEAN CORPUSCULAR VOLUME: 95 fL (ref 79–97)
PLATELET COUNT: 57 10*3/uL — CL (ref 150–450)
RED BLOOD CELL COUNT: 4.01 x10E6/uL — ABNORMAL LOW (ref 4.14–5.80)
RED CELL DISTRIBUTION WIDTH: 13.2 % (ref 11.6–15.4)
WHITE BLOOD CELL COUNT: 8.3 10*3/uL (ref 3.4–10.8)

## 2021-07-10 LAB — PROTIME-INR
INR: 1.4 — ABNORMAL HIGH (ref 0.9–1.2)
PROTHROMBIN TIME: 15 s — ABNORMAL HIGH (ref 9.1–12.0)

## 2021-07-10 NOTE — Unmapped (Signed)
I reviewed with the resident the medical history and the resident/fellow???s findings on physical examination.  I discussed with the resident/fellow the patient???s diagnosis and concur with the treatment plan as documented in the resident/fellow note.   Lynford Citizen, MD, PhD  Associate Professor of Medicine  Division of Endocrinology  [phone]

## 2021-07-14 NOTE — Unmapped (Signed)
Insurance has been verified.   ??  ??Patient??now??has active coverage with??[employer] Adv. [employer] is no longer showing active.Marland Kitchen.??  ??  Authorization is??[id number]??for Liver transplant??listing valid until??01/09/22  ??  ??Patient is financially cleared for Liver transplant??listing

## 2021-07-15 DIAGNOSIS — E1140 Type 2 diabetes mellitus with diabetic neuropathy, unspecified: Principal | ICD-10-CM

## 2021-07-15 DIAGNOSIS — Z794 Long term (current) use of insulin: Principal | ICD-10-CM

## 2021-07-16 DIAGNOSIS — K7210 Chronic hepatic failure without coma: Principal | ICD-10-CM

## 2021-07-16 NOTE — Unmapped (Signed)
Endocrine Emergency After Hours Call     07/15/21    Endocrine hx: Secondary AI (recovered), DM2 on ozempic 1 mg, tresiba 20u at bedtime; no hx of DKA  Primary Endocrinologist: Dr. Koleen Nimrod [employer] called the after hours line for complaint of average glucose of 375 over the last week, with the lowest of 200. He states he's currently taking 26u tresiba without improvement in his sugars. He is still taking ozempic 1 mg. He adds that his appetite has improved since recent hospital discharge.  He denies any recent illness. He denies taking any steroids recently.  He is currently using  [employer]. At approximately 0700, his sugar was 429.   He does state that he usually eats a snack before bed with a peanut butter half sandwich.   He denies missing any injections of tresiba.   He denies any hypoglycemic episodes this past week.   He states that he is drinking water regularly.     Type 2 diabetes mellitus with diabetic neuropathy, with long-term current use of insulin (CMS-HCC)  -will increase tresiba to 34u daily (30% increase) given persistent hyperglycemia   -pt to not adjust dose further until Wednesday (as instructed in last clinic visit)   -counseled pt on indications in which to seek Emergency Medical Attention   -counseled pt to avoid bedtime snack unless sugar <180 before bed  -Patient voiced understanding of plan and his questions were answered to his satisfaction.   -pt has CDE appt on 07/23/21 at which time further dose adjustments can be assessed       Encounter routed to Dr. Melrose Nakayama.     Vida Rigger. Claudell Kyle, MD  [facility] Endocrine Fellow, PGY-4  07/15/21

## 2021-07-18 LAB — COMPREHENSIVE METABOLIC PANEL
A/G RATIO: 0.8 — ABNORMAL LOW (ref 1.2–2.2)
ALBUMIN: 2.7 g/dL — ABNORMAL LOW (ref 3.8–4.8)
ALKALINE PHOSPHATASE: 195 IU/L — ABNORMAL HIGH (ref 44–121)
ALT (SGPT): 36 IU/L (ref 0–44)
AST (SGOT): 55 IU/L — ABNORMAL HIGH (ref 0–40)
BILIRUBIN TOTAL: 4.5 mg/dL — ABNORMAL HIGH (ref 0.0–1.2)
BLOOD UREA NITROGEN: 7 mg/dL — ABNORMAL LOW (ref 8–27)
BUN / CREAT RATIO: 6 — ABNORMAL LOW (ref 10–24)
CALCIUM: 8.7 mg/dL (ref 8.6–10.2)
CHLORIDE: 96 mmol/L (ref 96–106)
CO2: 27 mmol/L (ref 20–29)
CREATININE: 1.13 mg/dL (ref 0.76–1.27)
GLOBULIN, TOTAL: 3.5 g/dL (ref 1.5–4.5)
GLUCOSE: 193 mg/dL — ABNORMAL HIGH (ref 70–99)
POTASSIUM: 3.7 mmol/L (ref 3.5–5.2)
SODIUM: 133 mmol/L — ABNORMAL LOW (ref 134–144)
TOTAL PROTEIN: 6.2 g/dL (ref 6.0–8.5)

## 2021-07-18 LAB — PROTIME-INR
INR: 1.4 — ABNORMAL HIGH (ref 0.9–1.2)
PROTHROMBIN TIME: 14.9 s — ABNORMAL HIGH (ref 9.1–12.0)

## 2021-07-18 NOTE — Unmapped (Signed)
Time in/out: 11:05AM/11:55AM  Total time: 50 minutes    Assessment/Plan:   1. Diabetes, type 2  Patient with history of uncontrolled DM2. He called after hours Dec 4th due to hyperglycemia, with BGs in the 300-400+ range.  Matthew Key was increased to 34 units daily at that time. Sugars have improved, but remain high or very high 97% of the time.    1. Increase Tresiba to 40 units per day. If still running high, increase Tresiba by 2 units every 3 days until morning sugar is under 150 then continue that dose, up to max dose of 50 units.  2. Lengthy discussion of diet and foods containing carbs.  Advised to limit carbs like potato, corn, noodles, breads to 45 g per meal or less.  Discussed healthier meal options and encouraged to eat 3 meals per day rather than skipping and over eating. Advised to stop sugary drinks like sprite and orange juice. Unsweet tea is fine.  Handouts provided and reviewed.  3. Start walking regularly around apartment complex.    4. He was advised to contact the office if  sugars continue to run over 250.  He may be a good candidate for a SGLT2 in the future.  5. Return visit with Dr. Melrose Key in January as planned.      PCP: Matthew Brooklyn, MD    CC: Follow up Type 2 Diabetes.    Subjective:      Matthew Key is a 60 y.o. male who presents for follow up of Type 2 diabetes mellitus. Last seen by Dr. Melrose Key on July 03, 2021.  Of note, he has cirrhosis and is on transplant list.    Matthew Key called after hours on December 4th due to persistent hyperglycemia.  Per Dr. Doristine Key recommendation, he increased his Matthew Key to 34 units daily.  Glucose levels remain very high overall.  Lowest SG level noted was 187 (fasting).    [employer] and reviewed.           Currently taking:  Tresiba 36 units every day  Ozempic 1 mg weekly  Metformin contraindicated due to hx of cirrhosis.    Diet:   Breakfast:  None today, doesn't usually drink coffee, a couple of bites of a beef stick; a sip of sprite.  He doesn't drink diet drinks.  When he does eat breakfast, he has oatmeal unsweetened with milk; pancakes (rarely)  Lunch:  Spaghetti, water, protein drink like ensure  Dinner:  Noodles and spinach, no bread, water, sprites  Snacks: likes 1/2 PB sandwich in the evening but none lately  He doesn't eat fruit - doesn't like fruit but will drink OJ    Exercise:   More active but no structured exercise.  It is 1 mile to walk his apartment complex   grounds but he hasn't done this in awhile.    Eye exam last performed: 02/2021.  Unremarkable per pt.    He is a nonsmoker.      Social Hx:   married    Lab Results   Component Value Date    A1C 8.5 (H) 05/09/2021    A1C 7.6 (H) 03/05/2021    A1C 5.9 12/13/2019    A1C 4.9 05/20/2018       Wt Readings from Last 3 Encounters:   07/03/21 89.8 kg (198 lb)   06/25/21 92.1 kg (203 lb)   06/18/21 92.1 kg (203 lb)       Past Medical History:   Diagnosis Date   ???  Arthritis    ??? Cirrhosis (CMS-HCC)    ??? History of transfusion    ??? Hypertension     under control with meds and weight loss   ??? Liver disease    ??? Stroke (CMS-HCC)     mild stroke   ??? Type 2 diabetes mellitus with diabetic neuropathy, with long-term current use of insulin (CMS-HCC) 06/09/2014         Current Outpatient Medications:   ???  atorvastatin (LIPITOR) 10 MG tablet, Take 10 mg by mouth nightly., Disp: , Rfl:   ???  ciprofloxacin HCl (CIPRO) 500 MG tablet, TAKE 1 TABLET BY MOUTH IN THE MORNING, Disp: 30 tablet, Rfl: 0  ???  flash glucose sensor kit, 1 each by Other route every fourteen (14) days. Insert 1 sensor SQ q14 days; give #2 (30 day supply) or #6 (90 day supply) per pt preference, Disp: 2 each, Rfl: 5  ???  furosemide (LASIX) 20 MG tablet, Take 2 tablets (40 mg total) by mouth daily., Disp: 60 tablet, Rfl: 0  ???  gabapentin (NEURONTIN) 400 MG capsule, Take 400 mg by mouth two (2) times a day., Disp: , Rfl:   ???  hydrocortisone (CORTEF) 5 MG tablet, Take 10 mg (2 tablets) in morning as needed on days you feel sick. If you develop severe lightheadedness or severe nausea/vomiting you should be seen in the ER., Disp: 90 tablet, Rfl: 1  ???  insulin degludec (TRESIBA FLEXTOUCH U-100) 100 unit/mL (3 mL) InPn, Inject 0.2 mL (20 Units total) under the skin daily. Adjust as instructed., Disp: 6 mL, Rfl: 3  ???  insulin syringe-needle U-100 1 mL 31 gauge x 5/16 (8 mm) Syrg, Use once as needed for Hydrocortisone injection.., Disp: 10 each, Rfl: 0  ???  lactulose (CHRONULAC) 10 gram/15 mL (15 mL) Soln, Take 30 mL by mouth Two (2) times a day. , Disp: , Rfl:   ???  lisinopriL (PRINIVIL,ZESTRIL) 2.5 MG tablet, Take 2.5 mg by mouth daily., Disp: , Rfl:   ???  pantoprazole (PROTONIX) 40 MG tablet, Take 1 tablet (40 mg total) by mouth daily., Disp: 30 tablet, Rfl: 0  ???  polyethylene glycol (MIRALAX) 17 gram packet, Take 17 g by mouth daily., Disp: 30 packet, Rfl: 0  ???  rifAXIMin (XIFAXAN) 550 mg Tab, Take 1 tablet (550 mg total) by mouth Two (2) times a day., Disp: 60 tablet, Rfl: 11  ???  semaglutide (OZEMPIC) 1 mg/dose (4 mg/3 mL) PnIj injection, Inject 1 mg under the skin every seven (7) days., Disp: 3 mL, Rfl: 3  ???  spironolactone (ALDACTONE) 25 MG tablet, Take 2 tablets (50 mg total) by mouth daily., Disp: 180 tablet, Rfl: 3  ???  syringe with needle 3 mL 23 x 1 Syrg, Use as needed for Hydrocortisone injection, Disp: 1 each, Rfl: 0  ???  traZODone (DESYREL) 50 MG tablet, Take 100 mg by mouth nightly. (Patient not taking: Reported on 07/03/2021), Disp: , Rfl:     Allergies   Allergen Reactions   ??? Venom-Honey Bee Swelling       Family History   Problem Relation Age of Onset   ??? Alzheimer's disease Father    ??? Aortic dissection Brother        Objective:        There were no vitals taken for this visit.    Lab Review    Lab Results   Component Value Date    NA 133 (L) 07/17/2021    K  3.7 07/17/2021    CL 96 07/17/2021    CO2 27 07/17/2021    BUN 7 (L) 07/17/2021    CREATININE 1.13 07/17/2021    GFRNONAA 72 04/05/2019    GFRAA 84 04/05/2019    CALCIUM 8.7 07/17/2021    ALBUMIN 2.9 (L) 06/25/2021    PHOS 3.0 06/05/2021       Lab Results   Component Value Date    CHOL 149 12/13/2019     Lab Results   Component Value Date    LDL 68 12/13/2019     Lab Results   Component Value Date    HDL 63 12/13/2019     Lab Results   Component Value Date    TRIG 95 12/13/2019     Lab Results   Component Value Date    ALT 36 07/17/2021     Lab Results   Component Value Date    TSH 1.185 06/05/2021     Lab Results   Component Value Date    CREATUR 70.0 12/15/2020       Alexia Freestone, MSN, AGCNS-BC, APRN, CDCES

## 2021-07-23 ENCOUNTER — Institutional Professional Consult (permissible substitution): Admit: 2021-07-23 | Discharge: 2021-07-24 | Payer: MEDICARE

## 2021-07-23 ENCOUNTER — Ambulatory Visit: Admit: 2021-07-23 | Discharge: 2021-07-24 | Payer: MEDICARE

## 2021-07-23 DIAGNOSIS — K7210 Chronic hepatic failure without coma: Principal | ICD-10-CM

## 2021-07-23 LAB — COMPREHENSIVE METABOLIC PANEL
ALBUMIN: 2.6 g/dL — ABNORMAL LOW (ref 3.4–5.0)
ALKALINE PHOSPHATASE: 216 U/L — ABNORMAL HIGH (ref 46–116)
ALT (SGPT): 38 U/L (ref 10–49)
ANION GAP: 4 mmol/L — ABNORMAL LOW (ref 5–14)
AST (SGOT): 71 U/L — ABNORMAL HIGH (ref <=34)
BILIRUBIN TOTAL: 4.8 mg/dL — ABNORMAL HIGH (ref 0.3–1.2)
BLOOD UREA NITROGEN: 11 mg/dL (ref 9–23)
BUN / CREAT RATIO: 11
CALCIUM: 9 mg/dL (ref 8.7–10.4)
CHLORIDE: 98 mmol/L (ref 98–107)
CO2: 30 mmol/L (ref 20.0–31.0)
CREATININE: 0.97 mg/dL
EGFR CKD-EPI (2021) MALE: 89 mL/min/{1.73_m2} (ref >=60)
GLUCOSE RANDOM: 211 mg/dL — ABNORMAL HIGH (ref 70–179)
POTASSIUM: 3.9 mmol/L (ref 3.4–4.8)
PROTEIN TOTAL: 7 g/dL (ref 5.7–8.2)
SODIUM: 132 mmol/L — ABNORMAL LOW (ref 135–145)

## 2021-07-23 LAB — PROTIME-INR
INR: 1.61
PROTIME: 18.5 s — ABNORMAL HIGH (ref 9.8–12.8)

## 2021-07-23 NOTE — Unmapped (Signed)
[facility] Clinical Assessment & Refill Coordination Note    Matthew Key, Matthew Key: May 13, 1961  Phone: [phone] (home)     All above HIPAA information was verified with patient.     Was a Nurse, learning disability used for this call? No    Specialty Medication(s):   Infectious Disease: Xifaxan     Current Outpatient Medications   Medication Sig Dispense Refill   ??? atorvastatin (LIPITOR) 10 MG tablet Take 10 mg by mouth nightly.     ??? ciprofloxacin HCl (CIPRO) 500 MG tablet TAKE 1 TABLET BY MOUTH IN THE MORNING 30 tablet 0   ??? flash glucose sensor kit 1 each by Other route every fourteen (14) days. Insert 1 sensor SQ q14 days; give #2 (30 day supply) or #6 (90 day supply) per pt preference 2 each 5   ??? furosemide (LASIX) 20 MG tablet Take 2 tablets (40 mg total) by mouth daily. 60 tablet 0   ??? gabapentin (NEURONTIN) 400 MG capsule Take 400 mg by mouth two (2) times a day.     ??? hydrocortisone (CORTEF) 5 MG tablet Take 10 mg (2 tablets) in morning as needed on days you feel sick. If you develop severe lightheadedness or severe nausea/vomiting you should be seen in the ER. 90 tablet 1   ??? insulin degludec (TRESIBA FLEXTOUCH U-100) 100 unit/mL (3 mL) InPn Inject 0.2 mL (20 Units total) under the skin daily. Adjust as instructed. 6 mL 3   ??? insulin syringe-needle U-100 1 mL 31 gauge x 5/16 (8 mm) Syrg Use once as needed for Hydrocortisone injection.. 10 each 0   ??? lactulose (CHRONULAC) 10 gram/15 mL (15 mL) Soln Take 30 mL by mouth Two (2) times a day.      ??? lisinopriL (PRINIVIL,ZESTRIL) 2.5 MG tablet Take 2.5 mg by mouth daily.     ??? pantoprazole (PROTONIX) 40 MG tablet Take 1 tablet (40 mg total) by mouth daily. 30 tablet 0   ??? polyethylene glycol (MIRALAX) 17 gram packet Take 17 g by mouth daily. 30 packet 0   ??? rifAXIMin (XIFAXAN) 550 mg Tab Take 1 tablet (550 mg total) by mouth Two (2) times a day. 60 tablet 11   ??? semaglutide (OZEMPIC) 1 mg/dose (4 mg/3 mL) PnIj injection Inject 1 mg under the skin every seven (7) days. 3 mL 3   ??? spironolactone (ALDACTONE) 25 MG tablet Take 2 tablets (50 mg total) by mouth daily. 180 tablet 3   ??? syringe with needle 3 mL 23 x 1 Syrg Use as needed for Hydrocortisone injection 1 each 0   ??? traZODone (DESYREL) 50 MG tablet Take 100 mg by mouth nightly. (Patient not taking: Reported on 07/03/2021)       No current facility-administered medications for this visit.        Changes to medications: Matthew Key reports no changes at this time.    Allergies   Allergen Reactions   ??? Venom-Honey Bee Swelling       Changes to allergies: No    SPECIALTY MEDICATION ADHERENCE     Xifaxan 550 mg: unable to verify the number of  days of medicine on hand       Medication Adherence    Patient reported X missed doses in the last month: 2  Specialty Medication: Xifaxan 550mg   Patient is on additional specialty medications: No  Demonstrates understanding of importance of adherence: yes  Informant: patient  Provider-estimated medication adherence level: good  Patient is  at risk for Non-Adherence: No          Specialty medication(s) dose(s) confirmed: Regimen is correct and unchanged.     Are there any concerns with adherence? No    Adherence counseling provided? Not needed    CLINICAL MANAGEMENT AND INTERVENTION      Clinical Benefit Assessment:    Do you feel the medicine is effective or helping your condition? Yes    Clinical Benefit counseling provided? Not needed    Adverse Effects Assessment:    Are you experiencing any side effects? No    Are you experiencing difficulty administering your medicine? No    Quality of Life Assessment:    How many days over the past month did your hepatic encephalopathy  keep you from your normal activities? For example, brushing your teeth or getting up in the morning. 0    Have you discussed this with your provider? Not needed    Acute Infection Status:    Acute infections noted within Epic:  No active infections  Patient reported infection: None    Therapy Appropriateness:    Is therapy appropriate and patient progressing towards therapeutic goals? Yes, therapy is appropriate and should be continued    DISEASE/MEDICATION-SPECIFIC INFORMATION      N/A    PATIENT SPECIFIC NEEDS     - Does the patient have any physical, cognitive, or cultural barriers? No    - Is the patient high risk? No    - Does the patient require a Care Management Plan? No       SHIPPING     Specialty Medication(s) to be Shipped:   Infectious Disease: Xifaxan    Other medication(s) to be shipped: No additional medications requested for fill at this time     Changes to insurance: No    Delivery Scheduled: Yes, Expected medication delivery date: 07/25/21.     Medication will be delivered via UPS to the confirmed prescription address in [place].    The patient will receive a drug information handout for each medication shipped and additional FDA Medication Guides as required.  Verified that patient has previously received a Conservation officer, historic buildings and a Surveyor, mining.    The patient or caregiver noted above participated in the development of this care plan and knows that they can request review of or adjustments to the care plan at any time.      All of the patient's questions and concerns have been addressed.    Roderic Palau   [facility] Shared [facility] Specialty Pharmacist

## 2021-07-23 NOTE — Unmapped (Addendum)
It was a pleasure to meet you today!  Increase your Matthew Key to 40 units per day.  Then, if still running high, increase Tresiba 2 units every 3 days until morning sugar is under 150 then continue that dose, up to max dose of 50 units.  Work on diet - limit your carb foods like potato, corn, noodles, breads and stop sugary drinks like sprite and orange juice. Unsweet tea is fine.  Try to limit carbs to 45 grams per meal.  It is OK to have less carbs  Start walking regularly around your apartment complex.    Contact the office if your sugars continue to run over 250.

## 2021-07-24 MED FILL — XIFAXAN 550 MG TABLET: ORAL | 30 days supply | Qty: 60 | Fill #1

## 2021-07-24 NOTE — Unmapped (Signed)
Returned call to pt.  He states he was looking at portions of spinach and [place] and thought this was included in total grams of carbs per meal.  I reviewed that these are free foods, that are unlikely to raise his sugars. Focus on limiting the foods that contain carbs and reviewed examples of these.  I also referred him to his handout from yesterday.      He states FBG this AM was 133 after taking 36 units of [place].  I encouraged him to continue current dose and focus on diet.  He voiced understanding and appreciation for the call.  I encouraged him to reach out if any additional questions.

## 2021-07-24 NOTE — Unmapped (Signed)
Matthew Key,    Pt called the nurse line today stating that the 45 g of carbs per meal was not enough and he wouldn't be able to live on it. Pt is asking for a call back from you to discuss again. Should I put him down for a telephone call with you tomorrow?     Please advise,  Thanks!  Matthew Key

## 2021-07-30 DIAGNOSIS — K7210 Chronic hepatic failure without coma: Principal | ICD-10-CM

## 2021-07-30 NOTE — Unmapped (Signed)
Updated [employer] MELD score in [place] today with the following labs:  Recertification of  MELD is 22 via UNOS    Mr. Aversa was also noted to have the following Mild Encephalopathy and Controlled Ascites and this was noted in his MELD upgrade today.  Lab Results   Component Value Date    CREATININE 0.97 07/23/2021    NA 132 (L) 07/23/2021    BILITOT 4.8 (H) 07/23/2021    ALBUMIN 2.6 (L) 07/23/2021    INR 1.61 07/23/2021

## 2021-07-31 LAB — COMPREHENSIVE METABOLIC PANEL
A/G RATIO: 0.8 — ABNORMAL LOW (ref 1.2–2.2)
ALBUMIN: 2.7 g/dL — ABNORMAL LOW (ref 3.8–4.8)
ALKALINE PHOSPHATASE: 247 IU/L — ABNORMAL HIGH (ref 44–121)
ALT (SGPT): 40 IU/L (ref 0–44)
AST (SGOT): 73 IU/L — ABNORMAL HIGH (ref 0–40)
BILIRUBIN TOTAL: 3 mg/dL — ABNORMAL HIGH (ref 0.0–1.2)
BLOOD UREA NITROGEN: 9 mg/dL (ref 8–27)
BUN / CREAT RATIO: 8 — ABNORMAL LOW (ref 10–24)
CALCIUM: 8.9 mg/dL (ref 8.6–10.2)
CHLORIDE: 98 mmol/L (ref 96–106)
CO2: 25 mmol/L (ref 20–29)
CREATININE: 1.09 mg/dL (ref 0.76–1.27)
GLOBULIN, TOTAL: 3.6 g/dL (ref 1.5–4.5)
GLUCOSE: 180 mg/dL — ABNORMAL HIGH (ref 70–99)
POTASSIUM: 4.3 mmol/L (ref 3.5–5.2)
SODIUM: 134 mmol/L (ref 134–144)
TOTAL PROTEIN: 6.3 g/dL (ref 6.0–8.5)

## 2021-07-31 LAB — PROTIME-INR
INR: 1.4 — ABNORMAL HIGH (ref 0.9–1.2)
PROTHROMBIN TIME: 14.4 s — ABNORMAL HIGH (ref 9.1–12.0)

## 2021-08-06 DIAGNOSIS — K7210 Chronic hepatic failure without coma: Principal | ICD-10-CM

## 2021-08-11 LAB — COMPREHENSIVE METABOLIC PANEL
A/G RATIO: 0.7 — ABNORMAL LOW (ref 1.2–2.2)
ALBUMIN: 2.6 g/dL — ABNORMAL LOW (ref 3.8–4.8)
ALKALINE PHOSPHATASE: 238 IU/L — ABNORMAL HIGH (ref 44–121)
ALT (SGPT): 44 IU/L (ref 0–44)
AST (SGOT): 73 IU/L — ABNORMAL HIGH (ref 0–40)
BILIRUBIN TOTAL: 3.7 mg/dL — ABNORMAL HIGH (ref 0.0–1.2)
BLOOD UREA NITROGEN: 9 mg/dL (ref 8–27)
BUN / CREAT RATIO: 8 — ABNORMAL LOW (ref 10–24)
CALCIUM: 8.3 mg/dL — ABNORMAL LOW (ref 8.6–10.2)
CHLORIDE: 95 mmol/L — ABNORMAL LOW (ref 96–106)
CO2: 24 mmol/L (ref 20–29)
CREATININE: 1.2 mg/dL (ref 0.76–1.27)
GLOBULIN, TOTAL: 3.6 g/dL (ref 1.5–4.5)
GLUCOSE: 263 mg/dL — ABNORMAL HIGH (ref 70–99)
POTASSIUM: 3.9 mmol/L (ref 3.5–5.2)
SODIUM: 131 mmol/L — ABNORMAL LOW (ref 134–144)
TOTAL PROTEIN: 6.2 g/dL (ref 6.0–8.5)

## 2021-08-11 LAB — PROTIME-INR
INR: 1.4 — ABNORMAL HIGH (ref 0.9–1.2)
PROTHROMBIN TIME: 14.6 s — ABNORMAL HIGH (ref 9.1–12.0)

## 2021-08-13 DIAGNOSIS — K7210 Chronic hepatic failure without coma: Principal | ICD-10-CM

## 2021-08-20 DIAGNOSIS — K7210 Chronic hepatic failure without coma: Principal | ICD-10-CM

## 2021-08-20 NOTE — Unmapped (Signed)
Received call from pt stating he needs a LVP scheduled. Pt states his weight has increased from 198lbs to 215lbs in the past week.  Pt states things arent working. Pt states he is taking usual meds and having BMs, but they are strained.  Pt scheduled for LVP w Vikki, NP on 1/11 @ 0830. Pt verbalized understanding and had no other concerns at this time. Appt request sent to front desk for scheduling.

## 2021-08-21 LAB — COMPREHENSIVE METABOLIC PANEL
A/G RATIO: 0.7 — ABNORMAL LOW (ref 1.2–2.2)
ALBUMIN: 2.6 g/dL — ABNORMAL LOW (ref 3.8–4.8)
ALKALINE PHOSPHATASE: 172 IU/L — ABNORMAL HIGH (ref 44–121)
ALT (SGPT): 36 IU/L (ref 0–44)
AST (SGOT): 60 IU/L — ABNORMAL HIGH (ref 0–40)
BILIRUBIN TOTAL: 3.7 mg/dL — ABNORMAL HIGH (ref 0.0–1.2)
BLOOD UREA NITROGEN: 12 mg/dL (ref 8–27)
BUN / CREAT RATIO: 11 (ref 10–24)
CALCIUM: 8.8 mg/dL (ref 8.6–10.2)
CHLORIDE: 97 mmol/L (ref 96–106)
CO2: 24 mmol/L (ref 20–29)
CREATININE: 1.1 mg/dL (ref 0.76–1.27)
GLOBULIN, TOTAL: 3.5 g/dL (ref 1.5–4.5)
GLUCOSE: 101 mg/dL — ABNORMAL HIGH (ref 70–99)
POTASSIUM: 3.9 mmol/L (ref 3.5–5.2)
SODIUM: 129 mmol/L — ABNORMAL LOW (ref 134–144)
TOTAL PROTEIN: 6.1 g/dL (ref 6.0–8.5)

## 2021-08-21 LAB — PROTIME-INR
INR: 1.3 — ABNORMAL HIGH (ref 0.9–1.2)
PROTHROMBIN TIME: 14 s — ABNORMAL HIGH (ref 9.1–12.0)

## 2021-08-22 ENCOUNTER — Ambulatory Visit: Admit: 2021-08-22 | Discharge: 2021-08-23 | Payer: MEDICARE

## 2021-08-22 DIAGNOSIS — Z7682 Awaiting organ transplant status: Principal | ICD-10-CM

## 2021-08-22 DIAGNOSIS — K7460 Unspecified cirrhosis of liver: Principal | ICD-10-CM

## 2021-08-22 DIAGNOSIS — R0602 Shortness of breath: Principal | ICD-10-CM

## 2021-08-22 DIAGNOSIS — K7210 Chronic hepatic failure without coma: Principal | ICD-10-CM

## 2021-08-22 LAB — CBC
HEMATOCRIT: 33.7 % — ABNORMAL LOW (ref 39.0–48.0)
HEMOGLOBIN: 11.7 g/dL — ABNORMAL LOW (ref 12.9–16.5)
MEAN CORPUSCULAR HEMOGLOBIN CONC: 34.6 g/dL (ref 32.0–36.0)
MEAN CORPUSCULAR HEMOGLOBIN: 33.6 pg — ABNORMAL HIGH (ref 25.9–32.4)
MEAN CORPUSCULAR VOLUME: 97.3 fL — ABNORMAL HIGH (ref 77.6–95.7)
MEAN PLATELET VOLUME: 9.2 fL (ref 6.8–10.7)
PLATELET COUNT: 94 10*9/L — ABNORMAL LOW (ref 150–450)
RED BLOOD CELL COUNT: 3.46 10*12/L — ABNORMAL LOW (ref 4.26–5.60)
RED CELL DISTRIBUTION WIDTH: 15.6 % — ABNORMAL HIGH (ref 12.2–15.2)
WBC ADJUSTED: 5.1 10*9/L (ref 3.6–11.2)

## 2021-08-22 LAB — COMPREHENSIVE METABOLIC PANEL
ALBUMIN: 1.9 g/dL — ABNORMAL LOW (ref 3.4–5.0)
ALKALINE PHOSPHATASE: 174 U/L — ABNORMAL HIGH (ref 46–116)
ALT (SGPT): 37 U/L (ref 10–49)
ANION GAP: 7 mmol/L (ref 5–14)
AST (SGOT): 64 U/L — ABNORMAL HIGH (ref <=34)
BILIRUBIN TOTAL: 3.7 mg/dL — ABNORMAL HIGH (ref 0.3–1.2)
BLOOD UREA NITROGEN: 9 mg/dL (ref 9–23)
BUN / CREAT RATIO: 8
CALCIUM: 7.8 mg/dL — ABNORMAL LOW (ref 8.7–10.4)
CHLORIDE: 100 mmol/L (ref 98–107)
CO2: 27 mmol/L (ref 20.0–31.0)
CREATININE: 1.1 mg/dL
EGFR CKD-EPI (2021) MALE: 76 mL/min/{1.73_m2} (ref >=60)
GLUCOSE RANDOM: 131 mg/dL (ref 70–179)
POTASSIUM: 3.7 mmol/L (ref 3.4–4.8)
PROTEIN TOTAL: 5.5 g/dL — ABNORMAL LOW (ref 5.7–8.2)
SODIUM: 134 mmol/L — ABNORMAL LOW (ref 135–145)

## 2021-08-22 LAB — PROTIME-INR
INR: 1.83
PROTIME: 21.2 s — ABNORMAL HIGH (ref 9.8–12.8)

## 2021-08-22 MED ADMIN — albumin human 25 % 25 % bottle 25 g: 25 g | INTRAVENOUS | @ 14:00:00 | Stop: 2021-08-22

## 2021-08-22 NOTE — Unmapped (Signed)
Call placed at request of [name] to have pt increase diuretics. Pt instructed to increase diuretics to lasix 80mg  and spironolactone 100mg  daily.  Will repeat labs in one week. Pt verbalized understanding and had no other concerns at this time. Pt did request need to get PCV20 vaccine (will obtain when at clinic 1/23) and to schedule colonoscopy, both orders have been placed.

## 2021-08-22 NOTE — Unmapped (Signed)
The [facility] has made a second and final attempt to reach this patient to refill the following medication: xifaxan.      We have left voicemail with patient- reports he will call us back at the following phone numbers: [phone] and have sent a MyChart message.    Dates contacted: 1/5, 1/11  Last scheduled delivery: shipped 12/13    The patient may be at risk of non-compliance with this medication. The patient should call the [facility] at [phone]  Option 4, then Option 2 (all other specialty patients) to refill medication.    Westley Gambles   [facility] Specialty Technician

## 2021-08-22 NOTE — Unmapped (Signed)
Paracentesis      Indications:   Increased abdominal girth      Procedure:  The risks and benefits of the procedure were explained and informed consent was obtained.  Refer to the consent documentation.  The head of the bed was placed 30-45 degrees above level and the meniscus of the ascites was evaluated by percussion with maneuvers.   Anatomic landmarks were used to mark the intended site of paracentesis in theleft lower quadrant.  Ultrasound was used to confirm the site.     Local anesthesia with 1 percent lidocaine was introduced subcutaneously then deep to the skin until the parietal peritoneum was anesthetized. A small incision was made with a scalpel. A paracentesis needle with a catheter was introduced into this site until ascitic fluid was encountered and the catheter was introduced over the needle.  Ascitic fluid and the catheter were removed with minimal bleeding.  A sterile bandage was placed after holding pressure.  The patient tolerated the procedure well and remains in the same condition as pre-procedure.    Albumin 5%/25% 50 grams was administered at the time of paracentesis.       Complications:  none     Disposition of specimen(s)    Fluid Type:   Ascitic Fluid   Volume:   2.6 Liters   Character:   clear light pink   Sent for:   Cell count and diff

## 2021-08-22 NOTE — Unmapped (Signed)
0981 Patient arrived to the Transplant Infusion Room today for LVP, Condition: well, Mobility: ambulating, accompanied by spouse.  See Flowsheet and MAR for all details of visit.    1914 VS stable.  7829 PIV placed and secured with coban, labs collected and sent; urine no orders found.  0830 Albumin Infusion initiated.  5621 Provider Vallery Sa NP with patient, Consent signed, Timeout performed.  0908 LVP complete for 2.6 L of ascitic fluid.  0919 Albumin Infusion complete.  3086 VS stable, PIV removed and site secured with coban. Patient left clinic today, Condition: well, Mobility: ambulating, accompanied by spouse.

## 2021-08-23 NOTE — Unmapped (Signed)
Reason for call: Coordinate refill of Xifaxan 550 mg    [facility] has made multiple attempts to reach pt to coordinate refill of Xifaxan to prevent any gaps in treatment. I was able to reach pt on his wife's phone # [phone]. Pt reports having at least one full bottle left of Xifaxan. Pt denies any missed doses. Pt denies any side effects. No new allergies to report. Pt denies any medication changes.  Pt did have an insurance change which is listed in Epic. I will pass along a message to Tammy Sours PHarmD to hold off for 2 wks on refill call for Xifaxan 550 mg. Pt thanked me for the assistance.       Vertell Limber RN, [facility]   Pharmacy Department  [facility]  [address]   [place], [place] [address]  [phone]

## 2021-08-23 NOTE — Unmapped (Signed)
Insurance has been verified for 2023   ??  ??Patient??now??has active coverage with??[employer] Adv.??  ??  Authorization [id number] Liver transplant??listing valid until??01/09/22  ??  ??Patient is financially cleared for Liver transplant??listing

## 2021-08-25 ENCOUNTER — Ambulatory Visit: Admit: 2021-08-25 | Discharge: 2021-08-31 | Disposition: A | Discharge status: Home Health | Payer: MEDICARE

## 2021-08-25 ENCOUNTER — Ambulatory Visit: Admit: 2021-08-25 | Discharge: 2021-08-31 | Payer: MEDICARE

## 2021-08-25 ENCOUNTER — Encounter: Admit: 2021-08-25 | Discharge: 2021-08-31 | Disposition: A | Discharge status: Home Health | Payer: MEDICARE

## 2021-08-25 LAB — PROTIME-INR
INR: 2.1
PROTIME: 24.5 s — ABNORMAL HIGH (ref 9.8–12.8)

## 2021-08-25 LAB — URINALYSIS WITH CULTURE REFLEX
BACTERIA: NONE SEEN /HPF
BILIRUBIN UA: NEGATIVE
BLOOD UA: NEGATIVE
GLUCOSE UA: NEGATIVE
HYALINE CASTS: 1 /LPF (ref 0–1)
KETONES UA: NEGATIVE
LEUKOCYTE ESTERASE UA: NEGATIVE
NITRITE UA: NEGATIVE
PH UA: 5.5 (ref 5.0–9.0)
RBC UA: <1 /HPF (ref <=3)
SPECIFIC GRAVITY UA: 1.023 (ref 1.003–1.030)
SQUAMOUS EPITHELIAL: <1 /HPF (ref 0–5)
UROBILINOGEN UA: <2
WBC UA: 1 /HPF (ref <=2)

## 2021-08-25 LAB — COMPREHENSIVE METABOLIC PANEL
ALBUMIN: 2.4 g/dL — ABNORMAL LOW (ref 3.4–5.0)
ALKALINE PHOSPHATASE: 154 U/L — ABNORMAL HIGH (ref 46–116)
ALT (SGPT): 34 U/L (ref 10–49)
ANION GAP: 9 mmol/L (ref 5–14)
AST (SGOT): 58 U/L — ABNORMAL HIGH (ref <=34)
BILIRUBIN TOTAL: 8.4 mg/dL — ABNORMAL HIGH (ref 0.3–1.2)
BLOOD UREA NITROGEN: 9 mg/dL (ref 9–23)
BUN / CREAT RATIO: 9
CALCIUM: 8.2 mg/dL — ABNORMAL LOW (ref 8.7–10.4)
CHLORIDE: 100 mmol/L (ref 98–107)
CO2: 26 mmol/L (ref 20.0–31.0)
CREATININE: 1.02 mg/dL
EGFR CKD-EPI (2021) MALE: 84 mL/min/{1.73_m2} (ref >=60)
GLUCOSE RANDOM: 120 mg/dL (ref 70–179)
POTASSIUM: 3.6 mmol/L (ref 3.4–4.8)
PROTEIN TOTAL: 6.1 g/dL (ref 5.7–8.2)
SODIUM: 135 mmol/L (ref 135–145)

## 2021-08-25 LAB — CBC W/ AUTO DIFF
BASOPHILS ABSOLUTE COUNT: 0 10*9/L (ref 0.0–0.1)
BASOPHILS RELATIVE PERCENT: 0.4 %
EOSINOPHILS ABSOLUTE COUNT: 0 10*9/L (ref 0.0–0.5)
EOSINOPHILS RELATIVE PERCENT: 0.6 %
HEMATOCRIT: 36.5 % — ABNORMAL LOW (ref 39.0–48.0)
HEMOGLOBIN: 12.7 g/dL — ABNORMAL LOW (ref 12.9–16.5)
LYMPHOCYTES ABSOLUTE COUNT: 1 10*9/L — ABNORMAL LOW (ref 1.1–3.6)
LYMPHOCYTES RELATIVE PERCENT: 12.8 %
MEAN CORPUSCULAR HEMOGLOBIN CONC: 34.8 g/dL (ref 32.0–36.0)
MEAN CORPUSCULAR HEMOGLOBIN: 34.6 pg — ABNORMAL HIGH (ref 25.9–32.4)
MEAN CORPUSCULAR VOLUME: 99.4 fL — ABNORMAL HIGH (ref 77.6–95.7)
MEAN PLATELET VOLUME: 9.5 fL (ref 6.8–10.7)
MONOCYTES ABSOLUTE COUNT: 1.1 10*9/L — ABNORMAL HIGH (ref 0.3–0.8)
MONOCYTES RELATIVE PERCENT: 14.3 %
NEUTROPHILS ABSOLUTE COUNT: 5.5 10*9/L (ref 1.8–7.8)
NEUTROPHILS RELATIVE PERCENT: 71.9 %
PLATELET COUNT: 79 10*9/L — ABNORMAL LOW (ref 150–450)
RED BLOOD CELL COUNT: 3.67 10*12/L — ABNORMAL LOW (ref 4.26–5.60)
RED CELL DISTRIBUTION WIDTH: 15.7 % — ABNORMAL HIGH (ref 12.2–15.2)
WBC ADJUSTED: 7.6 10*9/L (ref 3.6–11.2)

## 2021-08-25 LAB — LIPASE: LIPASE: 32 U/L (ref 12–53)

## 2021-08-25 LAB — APTT
APTT: 39.2 s — ABNORMAL HIGH (ref 25.1–36.5)
HEPARIN CORRELATION: 0.2

## 2021-08-25 MED ADMIN — MORPhine 4 mg/mL injection 4 mg: 4 mg | INTRAVENOUS | Stop: 2021-08-25

## 2021-08-25 MED ADMIN — iohexoL (OMNIPAQUE) 350 mg iodine/mL solution 100 mL: 100 mL | INTRAVENOUS | Stop: 2021-08-25

## 2021-08-25 NOTE — Unmapped (Signed)
On call page received from pt that he is having severe, sharp abd pain that started yesterday. States abd is hard as a brick, taking lactulose 3x/daily but feels constipated and had to strain to urinate (pt states he has never had this issue). Denies fever. Diuretics increased on Weds to lasix 80mg /spiro 100mg . Discussed with Dr Sherryll Burger (on call hepatologist) who recommends pt come to [facility] ER for evaluation. Pt verbalized understanding.

## 2021-08-25 NOTE — Unmapped (Signed)
Pt states that pain radiates to r shoulder. Took regular lactulose and did not help feels backed up and tight. Some pain with urination. On liver transplant list.

## 2021-08-25 NOTE — Unmapped (Signed)
[facility]  Emergency Department Provider Note       HPI, ED Course, Assessment and Plan     Matthew Key is a 61 y.o. male with PMH of T2DM, HTN, cirrhosis, stroke, and arthritis presenting to the ED for evaluation of abdominal pain. The patient reports one day of abdominal pain radiating up to his right shoulder. He endorses one episode of vomiting.The patient notes that he is on the liver transplant list. He takes lactulose every day, and he took it today with no relief. He additionally endorses constipation and difficulty with BM.  His last bowel movement was today, however he states that his bowel movements yesterday were not as soft as normal.  He denies prior abdominal surgeries. He denies prior history of UTIs.  He has not noted any fevers at home, although he states that he was febrile when he presented emergency department.  He endorses dysuria yesterday.  He reports he had a paracentesis performed last week. denies hematuria, cough, or shortness of breath.     BP 132/64  - Pulse 114  - Temp (!) 38 ??C (100.4 ??F) (Oral)  - Resp 16  - SpO2 100%      Diagnostic workup as below.     Orders Placed This Encounter   Procedures   ??? Comprehensive Metabolic Panel   ??? Lipase Level   ??? CBC w/ Differential   ??? PT-INR   ??? APTT   ??? Urinalysis with Culture Reflex       MDM  61 year old with cirrhosis on liver transplant list presenting with diffuse abdominal pain and found to have a fever.  My differential includes SBP, cholecystitis, appendicitis amongst others. He had a paracentesis performed on 1/11 without evidence of SBP.  Will obtain basic labs, CT abdomen pelvis, diagnostic paracentesis.  Due to patient's fever, tachycardia will empirically give cefepime after diagnostic paracentesis.    Patient signed out to oncoming resident pending results of diagnostic paracentesis and CT abdomen pelvis        Past History     PAST MEDICAL HISTORY/PAST SURGICAL HISTORY:   Past Medical History:   Diagnosis Date   ??? Arthritis    ??? Cirrhosis (CMS-HCC)    ??? History of transfusion    ??? Hypertension     under control with meds and weight loss   ??? Liver disease    ??? Stroke (CMS-HCC)     mild stroke   ??? Type 2 diabetes mellitus with diabetic neuropathy, with long-term current use of insulin (CMS-HCC) 06/09/2014       Past Surgical History:   Procedure Laterality Date   ??? KNEE SURGERY     ??? PR CATH PLACE/CORON ANGIO, IMG SUPER/INTERP,R&L HRT CATH, L HRT VENTRIC N/A 12/19/2020    Procedure: CATH LEFT/RIGHT HEART CATHETERIZATION;  Surgeon: Rosana Hoes, MD;  Location: [facility] CATH;  Service: Cardiology   ??? PR UPPER GI ENDOSCOPY,BIOPSY N/A 10/12/2020    Procedure: UGI ENDOSCOPY; WITH BIOPSY, SINGLE OR MULTIPLE;  Surgeon: Marene Lenz, MD;  [facility];  Service: Gastroenterology       MEDICATIONS:   No current facility-administered medications for this encounter.    Current Outpatient Medications:   ???  atorvastatin (LIPITOR) 10 MG tablet, Take 10 mg by mouth nightly., Disp: , Rfl:   ???  ciprofloxacin HCl (CIPRO) 500 MG tablet, TAKE 1 TABLET BY MOUTH IN THE MORNING, Disp: 30 tablet, Rfl: 0  ???  flash glucose sensor kit,  1 each by Other route every fourteen (14) days. Insert 1 sensor SQ q14 days; give #2 (30 day supply) or #6 (90 day supply) per pt preference, Disp: 2 each, Rfl: 5  ???  furosemide (LASIX) 20 MG tablet, Take 2 tablets (40 mg total) by mouth daily., Disp: 60 tablet, Rfl: 0  ???  gabapentin (NEURONTIN) 400 MG capsule, Take 400 mg by mouth two (2) times a day., Disp: , Rfl:   ???  hydrocortisone (CORTEF) 5 MG tablet, Take 10 mg (2 tablets) in morning as needed on days you feel sick. If you develop severe lightheadedness or severe nausea/vomiting you should be seen in the ER., Disp: 90 tablet, Rfl: 1  ???  insulin degludec (TRESIBA FLEXTOUCH U-100) 100 unit/mL (3 mL) InPn, Inject 0.2 mL (20 Units total) under the skin daily. Adjust as instructed., Disp: 6 mL, Rfl: 3  ???  insulin syringe-needle U-100 1 mL 31 gauge x 5/16 (8 mm) Syrg, Use once as needed for Hydrocortisone injection.., Disp: 10 each, Rfl: 0  ???  lactulose (CHRONULAC) 10 gram/15 mL (15 mL) Soln, Take 30 mL by mouth Two (2) times a day. , Disp: , Rfl:   ???  lisinopriL (PRINIVIL,ZESTRIL) 2.5 MG tablet, Take 2.5 mg by mouth daily., Disp: , Rfl:   ???  pantoprazole (PROTONIX) 40 MG tablet, Take 1 tablet (40 mg total) by mouth daily., Disp: 30 tablet, Rfl: 0  ???  polyethylene glycol (MIRALAX) 17 gram packet, Take 17 g by mouth daily., Disp: 30 packet, Rfl: 0  ???  rifAXIMin (XIFAXAN) 550 mg Tab, Take 1 tablet (550 mg total) by mouth Two (2) times a day., Disp: 60 tablet, Rfl: 11  ???  semaglutide (OZEMPIC) 1 mg/dose (4 mg/3 mL) PnIj injection, Inject 1 mg under the skin every seven (7) days., Disp: 3 mL, Rfl: 3  ???  spironolactone (ALDACTONE) 25 MG tablet, Take 2 tablets (50 mg total) by mouth daily., Disp: 180 tablet, Rfl: 3  ???  syringe with needle 3 mL 23 x 1 Syrg, Use as needed for Hydrocortisone injection, Disp: 1 each, Rfl: 0  ???  traZODone (DESYREL) 50 MG tablet, Take 100 mg by mouth nightly. (Patient not taking: Reported on 07/03/2021), Disp: , Rfl:     ALLERGIES:   Venom-honey bee    SOCIAL HISTORY:   Social History     Tobacco Use   ??? Smoking status: Never   ??? Smokeless tobacco: Never   Substance Use Topics   ??? Alcohol use: Not Currently     Comment: light drinker. No drink in over a year .       FAMILY HISTORY:  Family History   Problem Relation Age of Onset   ??? Alzheimer's disease Father    ??? Aortic dissection Brother           Review of Systems    10 point review systems conducted and negative except for stated in HPI     Physical Exam     Vitals:    08/25/21 1223 08/25/21 1229   BP:  132/64   Pulse: 114    Resp: 16    Temp:  (!) 38 ??C (100.4 ??F)   TempSrc:  Oral   SpO2: 100%         Constitutional: In no distress.  Eyes: Conjunctivae are normal.  HEENT: Normocephalic and atraumatic.   Cardiovascular: See heart rate listed above. Normal skin perfusion  Respiratory: See respiratory rate listed above. Speaking easily in full  sentences  Gastrointestinal: Soft, non-distended, tender to palpation in his left lower, right lower, right upper quadrant without guarding or rebound  Musculoskeletal: Nontender with normal range of motion in all extremities.  Tender to palpation of his right shoulder  Neurologic: Normal speech and language. No gross focal neurologic deficits are appreciated. Patient is moving all extremities equally, face is symmetric at rest and with speech.   Skin: Skin is warm, dry and intact.  Psychiatric: Mood and affect are normal.     Radiology     No orders to display        Laboratory Data     Lab Results   Component Value Date    WBC 7.6 08/25/2021    HGB 12.7 (L) 08/25/2021    HCT 36.5 (L) 08/25/2021    PLT 79 (L) 08/25/2021       Lab Results   Component Value Date    NA 135 08/25/2021    K 3.6 08/25/2021    CL 100 08/25/2021    CO2 26.0 08/25/2021    BUN 9 08/25/2021    CREATININE 1.02 08/25/2021    GLU 120 08/25/2021    CALCIUM 8.2 (L) 08/25/2021    MG 1.5 (L) 06/24/2021    PHOS 3.0 06/05/2021       Lab Results   Component Value Date    BILITOT 8.4 (H) 08/25/2021    BILIDIR 1.40 (H) 06/24/2021    PROT 6.1 08/25/2021    ALBUMIN 2.4 (L) 08/25/2021    ALT 34 08/25/2021    AST 58 (H) 08/25/2021    ALKPHOS 154 (H) 08/25/2021    GGT 28 06/05/2021       Lab Results   Component Value Date    LABPROT 14.0 (H) 08/20/2021    INR 2.10 08/25/2021    APTT 39.2 (H) 08/25/2021       Portions of this record have been created using [employer]. Dictation errors have been sought, but may not have been identified and corrected.    Attestations     Documentation assistance was provided by Mohammed Kindle, Scribe on August 25, 2021 at 2:24 PM for Jamelle Rushing, MD.     Documentation assistance was provided by the scribe in my presence.  The documentation recorded by the scribe has been reviewed by me and accurately reflects the services I personally performed.       Carmelia Bake., MD  Resident  08/25/21 [phone]

## 2021-08-25 NOTE — Unmapped (Signed)
Patient sent in by transplant team (liver) to evaluate abdominal pain that started yesterday

## 2021-08-25 NOTE — Unmapped (Signed)
[facility]  Emergency Department Medical Screening Examination     Subjective     Matthew Key is a 61 y.o. male presenting for evaluation of Abdominal Pain. The patient reports an acute onset of sharp lower abdominal pain with associated distension yesterday, with inability to have a BM despite taking his typical thrice daily lactulose. He also endorses dysuria yesterday, stating the pain was like trying to pee through a golf ball. He additionally endorses shoulder pain. Of note, the patient recently had his diuretics increased to Lasix 80mg /spiro 100 mg 3 days ago. No recent EtOH intake. Denies fevers.      Abbreviated Review of Systems/Covid Screen  Constitutional: Negative for fever  Respiratory: Negative for cough. Negative for difficulty breathing.    Objective     ED Triage Vitals   Enc Vitals Group      BP 08/25/21 1229 132/64      Heart Rate 08/25/21 1223 114      SpO2 Pulse --       Resp 08/25/21 1223 16      Temp 08/25/21 1229 (!) 38 ??C (100.4 ??F)      Temp Source 08/25/21 1229 Oral      SpO2 08/25/21 1223 100 %     Focused Physical Exam  Constitutional: No acute distress.  Respiratory: Non-labored respirations.  Neurological: Clear speech. No gross focal neurologic deficits are appreciated.  ?  Assessment & Plan     Appropriate ED triage orders placed. Will defer comprehensive evaluation, assessment, and disposition to main ED team once bed becomes available.      A medical screening exam has been performed. At the time of this evaluation, no emergency medical condition requiring immediate stabilization has been identified nor is there suspicion for imminent decompensation. Appropriate triage protocols will be implemented and a comprehensive ED evaluation with disposition will be completed by a healthcare provider when an appropriate ED location becomes available. The patient is aware that this is an initial encounter only and verbalizes understanding and agreement with the plan. Emergency Department operations continue to be impacted by the COVID-19 pandemic.     Carolee Rota, MD   August 25, 2021 12:25 PM    Documentation assistance was provided by Lorrine Kin, Scribe, on August 25, 2021 at 12:29 PM for Carolee Rota, MD.      Documentation assistance was provided by the scribe in my presence.  The documentation recorded by the scribe has been reviewed by me and accurately reflects the services I personally performed.

## 2021-08-26 LAB — CBC
HEMATOCRIT: 27.3 % — ABNORMAL LOW (ref 39.0–48.0)
HEMOGLOBIN: 9.6 g/dL — ABNORMAL LOW (ref 12.9–16.5)
MEAN CORPUSCULAR HEMOGLOBIN CONC: 35.1 g/dL (ref 32.0–36.0)
MEAN CORPUSCULAR HEMOGLOBIN: 34.8 pg — ABNORMAL HIGH (ref 25.9–32.4)
MEAN CORPUSCULAR VOLUME: 99.3 fL — ABNORMAL HIGH (ref 77.6–95.7)
MEAN PLATELET VOLUME: 9.3 fL (ref 6.8–10.7)
PLATELET COUNT: 55 10*9/L — ABNORMAL LOW (ref 150–450)
RED BLOOD CELL COUNT: 2.75 10*12/L — ABNORMAL LOW (ref 4.26–5.60)
RED CELL DISTRIBUTION WIDTH: 15.4 % — ABNORMAL HIGH (ref 12.2–15.2)
WBC ADJUSTED: 4.7 10*9/L (ref 3.6–11.2)

## 2021-08-26 LAB — COMPREHENSIVE METABOLIC PANEL
ALBUMIN: 2.9 g/dL — ABNORMAL LOW (ref 3.4–5.0)
ALKALINE PHOSPHATASE: 64 U/L (ref 46–116)
ALT (SGPT): 21 U/L (ref 10–49)
ANION GAP: 7 mmol/L (ref 5–14)
AST (SGOT): 36 U/L — ABNORMAL HIGH (ref <=34)
BILIRUBIN TOTAL: 8.4 mg/dL — ABNORMAL HIGH (ref 0.3–1.2)
BLOOD UREA NITROGEN: 12 mg/dL (ref 9–23)
BUN / CREAT RATIO: 12
CALCIUM: 8.2 mg/dL — ABNORMAL LOW (ref 8.7–10.4)
CHLORIDE: 100 mmol/L (ref 98–107)
CO2: 28 mmol/L (ref 20.0–31.0)
CREATININE: 1 mg/dL
EGFR CKD-EPI (2021) MALE: 86 mL/min/{1.73_m2} (ref >=60)
GLUCOSE RANDOM: 112 mg/dL (ref 70–179)
POTASSIUM: 3.3 mmol/L — ABNORMAL LOW (ref 3.4–4.8)
PROTEIN TOTAL: 5.1 g/dL — ABNORMAL LOW (ref 5.7–8.2)
SODIUM: 135 mmol/L (ref 135–145)

## 2021-08-26 LAB — PROTIME-INR
INR: 3.11
PROTIME: 36.8 s — ABNORMAL HIGH (ref 9.8–12.8)

## 2021-08-26 LAB — HEMOGLOBIN A1C
ESTIMATED AVERAGE GLUCOSE: 120 mg/dL
HEMOGLOBIN A1C: 5.8 % — ABNORMAL HIGH (ref 4.8–5.6)

## 2021-08-26 LAB — CORTISOL: CORTISOL TOTAL: 5.7 ug/dL

## 2021-08-26 LAB — MAGNESIUM: MAGNESIUM: 1.6 mg/dL (ref 1.6–2.6)

## 2021-08-26 MED ADMIN — magnesium oxide (MAG-OX) tablet 400 mg: 400 mg | ORAL | @ 14:00:00 | Stop: 2021-08-26

## 2021-08-26 MED ADMIN — lactulose oral solution (30 mL cup): 20 g | ORAL | @ 21:00:00

## 2021-08-26 MED ADMIN — albumin human 25 % bottle 100 g: 100 g | INTRAVENOUS | @ 04:00:00 | Stop: 2021-08-25

## 2021-08-26 MED ADMIN — rifAXIMin (XIFAXAN) tablet 550 mg: 550 mg | ORAL | @ 06:00:00 | Stop: 2021-09-01

## 2021-08-26 MED ADMIN — gabapentin (NEURONTIN) capsule 400 mg: 400 mg | ORAL | @ 14:00:00

## 2021-08-26 MED ADMIN — potassium chloride CR tablet 40 mEq: 40 meq | ORAL | @ 14:00:00 | Stop: 2021-08-26

## 2021-08-26 MED ADMIN — insulin lispro (HumaLOG) injection 0-20 Units: 0-20 [IU] | SUBCUTANEOUS | @ 23:00:00

## 2021-08-26 MED ADMIN — lactulose oral solution (30 mL cup): 20 g | ORAL | @ 03:00:00

## 2021-08-26 MED ADMIN — cefTRIAXone (ROCEPHIN) 2 g in sodium chloride 0.9 % (NS) 100 mL IVPB-connector bag: 2 g | INTRAVENOUS | @ 05:00:00 | Stop: 2021-08-30

## 2021-08-26 MED ADMIN — atorvastatin (LIPITOR) tablet 10 mg: 10 mg | ORAL | @ 03:00:00

## 2021-08-26 MED ADMIN — rifAXIMin (XIFAXAN) tablet 550 mg: 550 mg | ORAL | @ 14:00:00 | Stop: 2021-09-01

## 2021-08-26 MED ADMIN — vancomycin (VANCOCIN) 1500 mg in sodium chloride (NS) 0.9 % 500 mL IVPB (premix): 1500 mg | INTRAVENOUS | @ 17:00:00 | Stop: 2021-08-26

## 2021-08-26 MED ADMIN — pantoprazole (PROTONIX) EC tablet 40 mg: 40 mg | ORAL | @ 14:00:00

## 2021-08-26 MED ADMIN — enoxaparin (LOVENOX) syringe 40 mg: 40 mg | SUBCUTANEOUS | @ 03:00:00

## 2021-08-26 MED ADMIN — lactulose oral solution (30 mL cup): 20 g | ORAL | @ 14:00:00

## 2021-08-26 MED ADMIN — polyethylene glycol (MIRALAX) packet 17 g: 17 g | ORAL | @ 14:00:00

## 2021-08-26 MED ADMIN — cefepime (MAXIPIME) 2 g in sodium chloride 0.9 % (NS) 100 mL IVPB-connector bag: 2 g | INTRAVENOUS | Stop: 2021-08-25

## 2021-08-26 NOTE — Unmapped (Addendum)
Matthew Key is a 61 y.o. 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  He is now s/p treatment with IV albumin and antibiotics and improved/stable for dc home with plan for outpatient PCP and hepatology follow up.  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 Diagnostic paracentesis with ~4600 PMNs and CT AP w/ mild sigmoid colitis. Pt received IV ceftriaxone w/ repeat diagnostic paracentesis 1/16 showing significant improvement in PMNs to 282.  Had another dx para with LVP 1/18 that showed PMN decreased to 130 and has now completed 5 day course ceftriaxone for SBP.  Will  RESTART PO ciprofloxacin for SBP ppx upon dc.     Secondary Adrenal Insufficiency  Follows w/ [facility], thought to be 2/2 chronic steroid exposure from multiple spinal injections. Previously on hydrocortisone daily though most recent cosyntropin stim test improved from 8.4->22.9. Endocrine consulted, no need for steroids this hospitalization given robust response to stim test and adequate treatment of infection. Has prn hydrocortisone at home in case of illness.     Poor dentition  Seen by dental team based on ICID recs given possible transplant and risk for dental infection.  Was taken for extraction on 1/19 with platelets given prior to procedure.  Had several teeth removed and will need more extractions per dental team and they will call to schedule this in outpatient setting.  Will provide oxycodone 5mg  #10 for pain control.      Chronic Medical Conditions  HTN - continue home lisinopril 2.5mg  and lasix/spiro 40/50mg  respectively.   Hx CVA vs TIA - seen in chart but no evidence on CT head 06/2021; continue home statin   T2DM - A1c 5.8% this admission; was NPH and then lower dose lantus at 10 units and BG noted to be 94 fasting.  Will dc with decreased tresiba dose to 10 units and have him hold ozempic for now and check blood sugars at home and follow up with PCP.

## 2021-08-26 NOTE — Unmapped (Signed)
Endocrine New Consult Note   Requesting Attending Physician : Corinna Capra, MD  Service Requesting Consult : Med General Dor[facility] Counter (MDU)  Primary Care Provider: Loman Brooklyn, MD    IMPRESSION:  Matthew Key is a 61 y.o. male admitted for SBP. We have been consulted at the request of Corinna Capra, MD to evaluate Matthew Key due to hx of secondary adrenal insufficiency with axis recovery.     Hx of secondary adrenal insufficiency but with established axis recovery    -normal ACTH stim test in Nov 2022: cortisol 22.9 which is normal   -pt has not needed PRN hydrocortisone in at least 2 months for illness   -here, pt without signs/sx of AI including persistent N/V, hypoglycemia, hypotension     Recommendations:  -no indication to start steroids in this pt with demonstrated HPA axis recovery on Nov 2022 ACTH stim  -if pt exhibits hypotension, check cortisol (should be 18 or greater with severe infection)   -Thank you for the consult. Will sign off.     Thank you for this consult. We communicated recommendations to the primary team.     Please page with questions or concerns: Endocrine fellow on call: [phone]    Vida Rigger. Claudell Kyle, MD  [facility] Endocrine Fellow, PGY-4  08/26/21        Subjective:   Initial encounter HPI:  Matthew Key is a 61 y.o. male with pertinent past medical history of cirrhosis, DM2, HTN, and CVA admitted for SBP.     08/24/21 was last dose of tresiba 40u at 0600 and 0900  Pt reports a 1-day hx of chest pain. He reports normal appetite up until that day but no dizziness. He reports one episode of vomiting but no persistent nausea. He reports having a BP cuff at home and last pressure at home was 126/50-60.    He reports a couple blood sugars in the 80's but no sx with this. He reports hypoglycemic sx start in the 70's.     Endocrine History:  History of secondary AI: Thought to be due to chronic illness and steroid exposure (spinal injections) but appears to have improved based on last stim test 8.4 -> 22.9 (06/2021) with ACTH 18, previously 0.7 -> 9.3 (12/2020) with ACTH < 5. No s/sxs of residual adrenal insufficiency that would suggest underreplacement.     - S/p cosyntropin stim test (8.4 -> 22.9) in 06/2021, stopped daily Hydrocortisone, only take Hydrocortisone 10mg  PRN illness    Current Nutrition:  Active Orders   Diet    Nutrition Therapy Regular/House       ROS: Per HPI, otherwise remaining of 10 systems negative.     atorvastatin  10 mg Oral Nightly    cefTRIAXone  2 g Intravenous Q24H    enoxaparin (LOVENOX) injection  40 mg Subcutaneous Q24H    gabapentin  400 mg Oral BID    insulin lispro  0-20 Units Subcutaneous ACHS    lactulose  20 g Oral TID    pantoprazole  40 mg Oral Daily    phytonadione (AQUA-MEPHYTON) intravenous  5 mg Intravenous Daily    polyethylene glycol  17 g Oral Daily    rifAXIMin  550 mg Oral BID    vancomycin  1,500 mg Intravenous Once    Followed by    vancomycin  1,000 mg Intravenous Q12H       Past Medical History:   Diagnosis Date    Arthritis     Cirrhosis (  CMS-HCC)     History of transfusion     Hypertension     under control with meds and weight loss    Liver disease     Stroke (CMS-HCC)     mild stroke    Type 2 diabetes mellitus with diabetic neuropathy, with long-term current use of insulin (CMS-HCC) 06/09/2014       Past Surgical History:   Procedure Laterality Date    KNEE SURGERY      PR CATH PLACE/CORON ANGIO, IMG SUPER/INTERP,R&L HRT CATH, L HRT VENTRIC N/A 12/19/2020    Procedure: CATH LEFT/RIGHT HEART CATHETERIZATION;  Surgeon: Rosana Hoes, MD;  Location: [facility] CATH;  Service: Cardiology    PR UPPER GI ENDOSCOPY,BIOPSY N/A 10/12/2020    Procedure: UGI ENDOSCOPY; WITH BIOPSY, SINGLE OR MULTIPLE;  Surgeon: Marene Lenz, MD;  [facility]: [facility];  Service: Gastroenterology        Family History   Problem Relation Age of Onset    Alzheimer's disease Father     Aortic dissection Brother        Social History     Tobacco Use Smoking status: Never    Smokeless tobacco: Never   Substance Use Topics    Alcohol use: Not Currently     Comment: light drinker. No drink in over a year .    Drug use: Never       Objective:  BP 131/49  - Pulse 81  - Temp 36.6 ??C (97.9 ??F) (Oral)  - Resp 18  - SpO2 95%   Wt Readings from Last 12 Encounters:   07/03/21 89.8 kg (198 lb)   06/25/21 92.1 kg (203 lb)   06/18/21 92.1 kg (203 lb)   06/05/21 100.2 kg (221 lb)   06/05/21 100.2 kg (221 lb)   05/31/21 (!) 102.9 kg (226 lb 12.8 oz)   05/14/21 (!) 104.8 kg (231 lb)   03/05/21 (!) 102.5 kg (226 lb)   02/08/21 (!) 102.5 kg (226 lb)   12/29/20 (!) 102.1 kg (225 lb)   12/22/20 (!) 105.3 kg (232 lb 3.2 oz)   11/27/20 96.3 kg (212 lb 3.2 oz)       Physical exam:   General: Well-appearing. No acute distress. Very pleasant.    HEENT: Normocephalic, atraumatic. EOMI, sclera anicteric.    Neck: No stridor. Trachea midline.   Cardiovascular: S1 S2 Regular rate and rhythm.  Respiratory: Clear to auscultation bilaterally. No wheezes.   Abdominal: Abdominal distention noted related to ascites. Tenderness to palpation.   MSK: no pedal  edema. Cracked, flaking skin to bilateral lower extremities.   Neuro: Awake, alert, and oriented. Moving all extremities.   Psych: Alert and oriented. Answers questions appropriately.     Data Review    Summary of labs:  Lab Results   Component Value Date    A1C 5.8 (H) 08/25/2021    A1C 8.5 (H) 05/09/2021    A1C 7.6 (H) 03/05/2021     Lab Results   Component Value Date    CREATININE 1.00 08/26/2021     Lab Results   Component Value Date    WBC 4.7 08/26/2021    HGB 9.6 (L) 08/26/2021    HCT 27.3 (L) 08/26/2021    PLT 55 (L) 08/26/2021       Lab Results   Component Value Date    NA 135 08/26/2021    K 3.3 (L) 08/26/2021    CL 100 08/26/2021    CO2 28.0  08/26/2021    BUN 12 08/26/2021    CREATININE 1.00 08/26/2021    GLU 112 08/26/2021    CALCIUM 8.2 (L) 08/26/2021    MG 1.6 08/26/2021    PHOS 3.0 06/05/2021       Lab Results   Component Value Date    BILITOT 8.4 (H) 08/26/2021    BILIDIR 1.40 (H) 06/24/2021    PROT 5.1 (L) 08/26/2021    ALBUMIN 2.9 (L) 08/26/2021    ALT 21 08/26/2021    AST 36 (H) 08/26/2021    ALKPHOS 64 08/26/2021    GGT 28 06/05/2021       Lab Results   Component Value Date    LABPROT 14.0 (H) 08/20/2021    INR 3.11 08/26/2021    APTT 39.2 (H) 08/25/2021

## 2021-08-26 NOTE — Unmapped (Signed)
Updated Matthew Key MELD score in [place] today with the following labs:  Recertification of  MELD is 28 via UNOS    Matthew Key was also noted to have the following Mild Encephalopathy and Moderate Ascites and this was noted in his MELD upgrade today.  Lab Results   Component Value Date    CREATININE 1.00 08/26/2021    NA 135 08/26/2021    BILITOT 8.4 (H) 08/26/2021    ALBUMIN 2.9 (L) 08/26/2021    INR 3.11 08/26/2021

## 2021-08-26 NOTE — Unmapped (Signed)
Internal Medicine (MEDU) History & Physical    Assessment & Plan:   Matthew Key is a 61 y.o. 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      Principal Problem:    Spontaneous bacterial peritonitis (CMS-HCC)  Active Problems:    Non-alcoholic micronodular cirrhosis of liver (CMS-HCC)    Obstructive sleep apnea on CPAP    Thrombocytopenia, unspecified (CMS-HCC)    Anemia    Adrenal insufficiency (CMS-HCC)  Resolved Problems:    * No resolved hospital problems. *    SBP - Decompensated Cryptogenic/NASH Cirrhosis c/b Ascites and HE  Fever - Nausea - Vomiting  Presenting with acute onset of diffuse abdominal pain and fever with Dx para consistent with SBP (~4600 PMNs). Culture pending. CT Abd/Pelvis with possible mild sigmoid colitis. No prior history of PsA or ESBL organisms. Last hospitalized in 06/2021.  Prior history of SBP x 1 per pt but can't find this documented in the chart or Care Everywhere. Has been adherent to indefinite Cipro ppx per pt. HDS though with worsening Tbili (from 4 to 8). Cr at baseline though subjective decrease in UOP per pt. Meets criteria for albumin administration. Received Cefepime x 1 in the ED, though given no risk factors for PsA, will continue CTX for SBP treatment. If pt continues to fever or decompensates further, low threshold to broaden to Zosyn given possible colitis on CT. BCx pending though collected after Cefepime administration. UA without concern for UTI. No other obvious sources of infection. Chronic mod/large R pleural effusion tapped 04/2021 with transudative effusion, cytology negative at that time; likely hepatic hydrothorax. Last LVP 1/11 with 2.5L removed.   - CTX 2g daily x 5 days pending improvement in abd pain  - Hepatology consult in the AM  - s/p Albumin 100g x 1; repeat 1/16  - Follow up Ascites Cx, BCx  - Daily CMP, INR  - Protonix 40mg  daily GI ppx  - Increase Lactulose to 20g TID; titrate to 3-5 BMs daily  - Rifaximin BID   - Miralax daily   - Hold home hydrocortisone for now given clinically well-appearing, but low threshold to add and broaden abx if decompensates further; defer to GI recs    - Hold home lasix/spiro  - Hold home Cipro while on SBP treatment; may need to transition to alternative ppx agent given this likely represents breakthrough     MELD-Na score: 24 at 08/25/2021  1:12 PM  MELD score: 23 at 08/25/2021  1:12 PM  Calculated from:  Serum Creatinine: 1.02 mg/dL at 11/18/8117  1:47 PM  Serum Sodium: 135 mmol/L at 08/25/2021  1:12 PM  Total Bilirubin: 8.4 mg/dL at 04/09/5620  3:08 PM  INR(ratio): 2.10 at 08/25/2021  1:12 PM  Age: 29 years    Type 2 DM:  Follows with [facility]. A1c 8.5% (04/2021). On Ozempic and Triseba 40U daily. BG on admission 120 and pt with decreased po intake over the last 24 hours 2/2 nausea/SBP above.   - Hold long acting insulin pending glucose trend; likely will need to start 1/15 if tolerating po  - SSI   - Hold home ozempic  - Atorvastatin 10mg  daily     Secondary Adrenal Insufficiency:   Follows with [facility]. Thought to be secondary to chronic steroid exposure. Previously on Hydrocortisone daily though most recent cosyntropin stim test  improved (8.4 -> 22.9). Discontinued daily use and now just takes 10mg  PRN in the setting of acute illness. Given pt is HDS and currently infected, the risks likely outweigh any potential benefit at present, though plan to start if patient clinical status declines.  - Hold home Hydrocort 10mg  PRN for now    HTN:  Normotensive on admission. Will hold antihypertensives in the setting of infection.   - Hold Lisinopril 2.5mg  daily   - Hold Lasix/spiro    ?CVA vs. TIA:  Carried forward in patient chart since at least 2018, though no evidence of this on CT head 06/2021. Possible TIA? Unclear.   - Atorva 10mg  daily   - No indication to start ASA given uncertainty of diagnosis and bleeding risk with cirrhosis/thrombocytopenia     Daily Checklist:  Diet: Regular Diet  DVT PPx: Lovenox 40mg  q24h  Electrolytes: Replete Potassium to >/=4 and Magnesium to >/=2  Code Status: Full Code    Chief Concern:   Spontaneous bacterial peritonitis (CMS-HCC)    Subjective:   HPI:  Matthew Key is a 61 y.o. male with PMHx of cryptogenic cirhossis (listed for transplant), T2DM, HTN, and CVA that presented to the [facility] ED with acute onset abdominal pain and fever, found to have SBP.      The patient reports one day of abdominal pain radiating up to his right shoulder. He endorses one episode of NBNB vomiting. Taking Lactulose daily though no BM since 1/14 (noted only one small, hard stool). No history of prior abdominal surgery or UTI. Endorses one prior episode of SBP years ago though can't recall a specific date. Adherent to Cipro ppx to the best of his knowledge. He has not noted any fevers at home, although he states that he was febrile when he presented emergency department.  Paracentesis 1/11 with 2.5L removed. Denies hematuria, cough, or shortness of breath.     On arrival to the ED, pt was Tachycardic and febrile to 38C though otherwise HDS. Labs notable for WBC 7.6, Tbili 8.4 (3.7 08/22/21), Cr 1.02, INR 2.1 (1.3 08/20/21). UA wnl. Dx para with 4800 PMNs, culture pending. Received Cefepime x 1. BCx collected. Administered albumin 100g x 1.     Designated Environmental health practitioner:  Matthew Key currently has decisional capacity for healthcare decision-making and is able to designate a surrogate healthcare decision maker. Matthew Key designated healthcare decision maker(s) is/are Matthew Key (the patient's spouse) as denoted by stated patient preference.    Allergies:  Venom-honey bee    Medications:   Prior to Admission medications    Medication Dose, Route, Frequency   atorvastatin (LIPITOR) 10 MG tablet 10 mg, Oral, Nightly   ciprofloxacin HCl (CIPRO) 500 MG tablet TAKE 1 TABLET BY MOUTH IN THE MORNING   flash glucose sensor kit 1 each, Other, Every 14 days, Insert 1 sensor SQ q14 days; give #2 (30 day supply) or #6 (90 day supply) per pt preference   furosemide (LASIX) 20 MG tablet 40 mg, Oral, Daily (standard)   gabapentin (NEURONTIN) 400 MG capsule 400 mg, Oral, 2 times a day   hydrocortisone (CORTEF) 5 MG tablet Take 10 mg (2 tablets) in morning as needed on days you feel sick. If you develop severe lightheadedness or severe nausea/vomiting you should be seen in the ER.   insulin degludec (TRESIBA FLEXTOUCH U-100) 100 unit/mL (3 mL) InPn 20 Units, Subcutaneous, Daily (standard), Adjust as instructed.   insulin syringe-needle U-100 1 mL 31 gauge x 5/16 (8  mm) Syrg Use once as needed for Hydrocortisone injection.Marland Kitchen   lactulose (CHRONULAC) 10 gram/15 mL (15 mL) Soln 30 mL, Oral, 2 times a day (standard)   lisinopriL (PRINIVIL,ZESTRIL) 2.5 MG tablet 2.5 mg, Oral, Daily (standard)   pantoprazole (PROTONIX) 40 MG tablet 40 mg, Oral, Daily (standard)   polyethylene glycol (MIRALAX) 17 gram packet 17 g, Oral, Daily (standard)   rifAXIMin (XIFAXAN) 550 mg Tab 550 mg, Oral, 2 times a day (standard)   semaglutide (OZEMPIC) 1 mg/dose (4 mg/3 mL) PnIj injection 1 mg, Subcutaneous, Every 7 days   spironolactone (ALDACTONE) 25 MG tablet 50 mg, Oral, Daily (standard)   syringe with needle 3 mL 23 x 1 Syrg Use as needed for Hydrocortisone injection   traZODone (DESYREL) 50 MG tablet 100 mg, Nightly  Patient not taking: Reported on 07/03/2021       Medical History:  Past Medical History:   Diagnosis Date   ??? Arthritis    ??? Cirrhosis (CMS-HCC)    ??? History of transfusion    ??? Hypertension     under control with meds and weight loss   ??? Liver disease    ??? Stroke (CMS-HCC)     mild stroke   ??? Type 2 diabetes mellitus with diabetic neuropathy, with long-term current use of insulin (CMS-HCC) 06/09/2014       Surgical History:  Past Surgical History:   Procedure Laterality Date   ??? KNEE SURGERY     ??? PR CATH PLACE/CORON ANGIO, IMG SUPER/INTERP,R&L HRT CATH, L HRT VENTRIC N/A 12/19/2020    Procedure: CATH LEFT/RIGHT HEART CATHETERIZATION;  Surgeon: Rosana Hoes, MD;  Location: [facility] CATH;  Service: Cardiology   ??? PR UPPER GI ENDOSCOPY,BIOPSY N/A 10/12/2020    Procedure: UGI ENDOSCOPY; WITH BIOPSY, SINGLE OR MULTIPLE;  Surgeon: Marene Lenz, MD;  [facility]: [facility];  Service: Gastroenterology       Family History:   Family History   Problem Relation Age of Onset   ??? Alzheimer's disease Father    ??? Aortic dissection Brother        Social History:  The patient lives with family    Social History     Tobacco Use   ??? Smoking status: Never   ??? Smokeless tobacco: Never   Substance Use Topics   ??? Alcohol use: Not Currently     Comment: light drinker. No drink in over a year .   ??? Drug use: Never        Review of Systems:  10 systems were reviewed and are negative unless otherwise mentioned in the HPI    Objective:   Physical Exam:  Temp:  [36.7 ??C (98 ??F)-38 ??C (100.4 ??F)] 37.3 ??C (99.1 ??F)  Heart Rate:  [89-114] 91  SpO2 Pulse:  [92] 92  Resp:  [16-19] 18  BP: (120-132)/(48-64) 120/50  SpO2:  [93 %-100 %] 93 %    Gen: WDWN male in NAD, answers questions appropriately  Eyes: Sclera icteric, EOMI, PERRLA,  HENT: MMM. OP w/o erythema or exudate   Neck: no JVD  Heart: RRR, no M/R/G, no chest wall tenderness  Lungs: CTAB, reduced breath sounds on R, no crackles or wheezes, no use of accessory muscles  Abdomen: Normoactive bowel sounds, soft, distended, tender to deep palpation over bilateral lower quadrants, fluid wave present, no rebound/guarding  Extremities: 1+ bilateral LE edema: pulses are +2 in bilateral upper and lower extremities  Neuro: CN II-XI grossly intact, normal cerebellar function, normal  gait. No focal deficits.  Skin:  No rashes, lesions on clothed exam  Psych: Alert, oriented, normal mood and affect.     Labs/Studies/Imaging:  Labs, Studies, Imaging from the last 24hrs per EMR and personally reviewed    Malachy Moan, MD PGY-2   [facility] Internal Medicine

## 2021-08-26 NOTE — Unmapped (Signed)
Vancomycin Therapeutic Monitoring Pharmacy Note    Matthew Key is a 61 y.o. male starting vancomycin. Date of therapy initiation: 08/26/21    Indication: Suspected infection     Prior Dosing Information: None/new initiation     Goals:  Therapeutic Drug Levels  Vancomycin trough goal: 10-15 mg/L    Additional Clinical Monitoring/Outcomes  Renal function, volume status (intake and output)    Results: Not applicable    Wt Readings from Last 1 Encounters:   07/03/21 89.8 kg (198 lb)     Creatinine   Date Value Ref Range Status   08/26/2021 1.00 0.60 - 1.10 mg/dL Final   16/05/9603 5.40 0.60 - 1.10 mg/dL Final   98/06/9146 8.29 0.60 - 1.10 mg/dL Final        Pharmacokinetic Considerations and Significant Drug Interactions:  ??? Adult (estimated initial): Vd = 63.7 L, ke = 0.0700 hr-1  ??? Concurrent nephrotoxic meds: not applicable    Assessment/Plan:  Recommendation(s)  ??? Start vancomycin 1500 mg IV x1 then 1000 mg IV q12 hours  ??? Estimated trough on recommended regimen: 13 mg/L    Follow-up  ??? Level due: prior to fourth or fifth dose  ??? A pharmacist will continue to monitor and order levels as appropriate    Please page service pharmacist with questions/clarifications.    Deanna Artis, PharmD   PGY2 Critical Care Pharmacy Resident

## 2021-08-26 NOTE — Unmapped (Signed)
Hepatology Consult Service   Initial Consultation         Assessment and Recommendations:   Matthew Key is a 61 y.o. 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 Pt is seen in consultation at the request of Corinna Capra, MD ([facility] ([facility])) for cirrhosis with SBP.    Recommendations  - Consult ICID for assistance in management  - Agree with ceftriaxone 2g daily for now  - Continue holding home diuretics  - Repeat paracentesis tomorrow (1/16) for repeat cell counts and therapeutic relief, with no more than 3-4L off  - Administer 25% albumin 1g/kg tomorrow  - Resume home hydrocortisone  - Patient will become inactive on transplant list while treating SBP  - He will need to resume ciprofloxacin for SBP prophylaxis upon discharge  - Will need to monitor pancreatic cyst and consider EUS-FNA in future      Thank you for involving [place] in the care of your patient. We will continue to follow along with you. This patient was seen and examined with Dr. Sherryll Burger.     For questions, please contact the on-call fellow for the Hepatology Consult Service at [phone].     Tammi Sou  Gastroenterology & Hepatology Fellow  [place] [facility]      Reason for Consultation:   Pt was seen in consultation at the request of Corinna Capra, MD ([facility] ([facility])) for cirrhosis with SBP.    Subjective:   HPI:  Matthew Key is a 61 y.o. 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     His cirrhosis has been complicated by ascites, hepatic encephalopathy and non-bleeding esophageal varices.??Patient???s prior issues have included hyponatremia, adrenal insufficiency, SBP and volume overload.     Seen in clinic most recently in November at which time he was started on miralax in addition to lactulose. Also started on rifaximin. States he stopped taking rifaximin because felt like it was causing gas pain. Previously on cipro for SBP ppx, but does not recall taking this recently and it appears that it has not been filled at the pharmacy since July 2022. Most recent paracentesis on 1/11 with 2.6L removed. Diuretics increased last week to lasix 80mg /spiro 100mg .    States he developed abdominal pain on Friday.  Describes it as a severe pain, 10 out of 10 in severity, localized to the right side of his abdomen and radiated up to his right shoulder.  He denies fevers or chills at home.  He has been taking his lactulose 2-3 times daily, which results in at least 3 about bowel movements daily.  He denies any problems with hepatic encephalopathy since his most recent hospitalization in November.  He has not been taking MiraLAX.    He was febrile to 100.4 upon arrival yesterday. Tachycardic at that time. HR normalized and hemodynamically stable since. Diagnostic para in ED with 4600 PMNs. Ascitic culture and blood cultures pending. Labs on admission notable for stable CBC, Cr 1 (baseline), Tbili 8.4 (up  from baseline ~4). CT A/P revealed wall thickening of sigmoid colon (related to underdistention vs colitis), moderate ascites, moderate to large right pleural effusion. He was given 100g of 25% albumin and a dose of cefepime.     Allergies:  Venom-honey bee    Medications:   Prior to Admission medications    Medication Dose, Route, Frequency   atorvastatin (LIPITOR) 10 MG tablet 10 mg, Oral, Nightly   ciprofloxacin HCl (CIPRO) 500 MG tablet TAKE 1 TABLET BY MOUTH IN THE MORNING   flash glucose sensor kit 1 each, Other, Every 14 days, Insert 1 sensor SQ q14 days; give #2 (30 day supply) or #6 (90 day supply) per pt preference   furosemide (LASIX) 20 MG tablet 40 mg, Oral, Daily (standard)   gabapentin (NEURONTIN) 400 MG capsule 400 mg, Oral, 2 times a day   hydrocortisone (CORTEF) 5 MG tablet Take 10 mg (2 tablets) in morning as needed on days you feel sick. If you develop severe lightheadedness or severe nausea/vomiting you should be seen in the ER.   insulin degludec (TRESIBA FLEXTOUCH U-100) 100 unit/mL (3 mL) InPn 20 Units, Subcutaneous, Daily (standard), Adjust as instructed.   insulin syringe-needle U-100 1 mL 31 gauge x 5/16 (8 mm) Syrg Use once as needed for Hydrocortisone injection.Marland Kitchen   lactulose (CHRONULAC) 10 gram/15 mL (15 mL) Soln 30 mL, Oral, 2 times a day (standard)   lisinopriL (PRINIVIL,ZESTRIL) 2.5 MG tablet 2.5 mg, Oral, Daily (standard)   pantoprazole (PROTONIX) 40 MG tablet 40 mg, Oral, Daily (standard)   polyethylene glycol (MIRALAX) 17 gram packet 17 g, Oral, Daily (standard)   rifAXIMin (XIFAXAN) 550 mg Tab 550 mg, Oral, 2 times a day (standard)   semaglutide (OZEMPIC) 1 mg/dose (4 mg/3 mL) PnIj injection 1 mg, Subcutaneous, Every 7 days   spironolactone (ALDACTONE) 25 MG tablet 50 mg, Oral, Daily (standard)   syringe with needle 3 mL 23 x 1 Syrg Use as needed for Hydrocortisone injection   traZODone (DESYREL) 50 MG tablet 100 mg, Nightly  Patient not taking: Reported on 07/03/2021       Medical History:  Past Medical History:   Diagnosis Date   ??? Arthritis    ??? Cirrhosis (CMS-HCC)    ??? History of transfusion    ??? Hypertension     under control with meds and weight loss   ??? Liver disease    ??? Stroke (CMS-HCC)     mild stroke   ??? Type 2 diabetes mellitus with diabetic neuropathy, with long-term current use of insulin (CMS-HCC) 06/09/2014       Surgical History:  Past Surgical History:   Procedure Laterality Date   ??? KNEE SURGERY     ??? PR CATH PLACE/CORON ANGIO, IMG SUPER/INTERP,R&L HRT CATH, L HRT VENTRIC N/A 12/19/2020    Procedure: CATH LEFT/RIGHT HEART CATHETERIZATION;  Surgeon: Rosana Hoes, MD;  Location: [facility] CATH;  Service: Cardiology   ??? PR UPPER GI ENDOSCOPY,BIOPSY N/A 10/12/2020    Procedure: UGI ENDOSCOPY; WITH BIOPSY, SINGLE OR MULTIPLE;  Surgeon: Marene Lenz, MD;  [facility];  Service: Gastroenterology       Social History:  Social History     Tobacco Use   ??? Smoking status: Never   ??? Smokeless tobacco: Never   Substance Use Topics   ??? Alcohol use: Not Currently     Comment: light drinker. No drink in over a year .   ??? Drug use: Never  Family History:  Family History   Problem Relation Age of Onset   ??? Alzheimer's disease Father    ??? Aortic dissection Brother        Review of Systems:  10 systems were reviewed and are negative unless otherwise mentioned in the HPI    Objective:   Physical Exam:  Temp:  [36.7 ??C (98 ??F)-38 ??C (100.4 ??F)] 37.2 ??C (99 ??F)  Heart Rate:  [89-114] 89  SpO2 Pulse:  [92] 92  Resp:  [16-20] 20  BP: (120-132)/(48-64) 129/53  SpO2:  [93 %-100 %] 95 %    Gen: comfortable-appearing middle-aged man lying in bed in NAD  Eyes: Sclera anicteric, EOMI  HENT: Atraumatic, normocephalic, MMM  Heart: NRRR  Lungs: Normal work of breathing on room air  Abdomen: Soft, moderately distended, non-tender to palpation, mild erythema surrounding bandage in LLQ  Extremities: trace edema BLE  Neuro: Alert and oriented. No gross focal deficits. No asterixis  Skin:  No rashes, lesions on clothed exam  Psych: Normal mood and affect.     Labs/Studies:  Labs, studies, and imaging from the last 24hrs per EMR and personally reviewed.

## 2021-08-26 NOTE — Unmapped (Addendum)
NEW IMMUNOCOMPROMISED HOST INFECTIOUS DISEASE CONSULT NOTE      Matthew Key is being seen in consultation at the request of Corinna Capra, MD for evaluation of SBP.    Assessment/Recommendations:    Matthew Key is a 61 y.o. 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  Pertinent Co-morbidities  T2DM (HbA1c 5.8% 08/25/2021)  Adrenal insufficiency on hydrocortisone  Poor dentition 08/2021 chronic, stable  [low / mod]  ??  Pertinent Exposure History  Dogs at home  Multiple recent healthcare exposures  ??  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 Cx NGTD   Rx: 1/14 vanc/cefepime -> 1/15 ceftriaxone      Prior Infections:   #COVID infection, 04/14/2021    Antimicrobial Intolerance/allergy  None known       RECOMMENDATIONS    Diagnostic  ??? Repeat diagnostic paracentesis with cell count, differential, and culture on day 3   ??? F/u Ascitic fluid cx 1/14    Management  For SBP  ??? Continue ceftriaxone for SBP, anticipate 5 days of therapy until 08/29/21  ??? Stop Vancomycin  ??? Transplant should be held until ascitic fluid PMNs are <250    For poor dentition  ?? Referral to [facility] dental as he likely needs several teeth pulled although there is no pain concerning for active infection at this time.     Prophylaxis required for immunosuppression associated with cirrhosis and need for transplant  ??? Anticipate restarting cipro 500mg  daily for SBP ppx on completion of treatment  ??? Please provide PCV20 and Shingles #1 prior to discharge    Management of prescription antimicrobials needing intensive toxicity monitoring  ??? CBC w/diff at least once per week  ??? CMP at least once per week    The [facility] ID service will continue to follow.           Please page the ID Transplant/Liquid Oncology Fellow consult at [phone] with questions.  Patient discussed with Dr. Reynold Bowen.    Lysle Morales, MD   Fellow, [facility] Infectious Diseases     Attending attestation  I saw and evaluated the patient on 08/27/2021, participating in the key portions of the service.  I reviewed the resident???s note.  I agree with the resident???s findings and plan.   Ephraim Hamburger, MD      History of Present Illness:      Source of information includes:  Electronic Medical Records.    History of present illness:   Matthew Key is a 61 y.o. male with PMHx of cryptogenic (likely NASH) cirhossis (listed for transplant), T2DM, HTN, and CVA that presented to the [facility] ED with acute onset abdominal pain and fever.   Patient states he started having severe abdominal pain on Friday that radiated up to his shoulder. He denied any associated fevers at home, nausea, vomiting, or diarrhea. He is unsure if he was taking ciprofloxacin at home because there are too many pills and I can't keep track. He recently had a paracentesis 1/11 with normal cell count. Given persistent abdominal pain, he presented to [facility]  1/14 for further work up and was found to have SBP. Started on ceftriaxone once admitted to internal medicine. ID consulted regarding further treatment/recommendations given he is an active transplant candidate.     Allergies:  Allergies   Allergen Reactions   ??? Venom-Honey Bee Swelling       Medications:   Antimicrobials:  Anti-infectives (From admission, onward)    Start     Dose/Rate Route Frequency Ordered Stop    08/26/21 2237  vancomycin (VANCOCIN) IVPB 1000 mg (premix)        See Hyperspace for full Linked Orders Report.    1,000 mg  over 60 Minutes Intravenous Every 12 hours 08/26/21 1036 08/31/21 2236    08/26/21 1035  vancomycin (VANCOCIN) 1500 mg in sodium chloride (NS) 0.9 % 500 mL IVPB (premix)        See Hyperspace for full Linked Orders Report.    1,500 mg  333.3 mL/hr over 90 Minutes Intravenous Once 08/26/21 1036      08/26/21 0000  cefTRIAXone (ROCEPHIN) 2 g in sodium chloride 0.9 % (NS) 100 mL IVPB-connector bag         2 g  200 mL/hr over 30 Minutes Intravenous Every 24 hours 08/25/21 2149 08/30/21 2359    08/25/21 2114  rifAXIMin (XIFAXAN) tablet 550 mg         550 mg Oral 2 times a day (standard) 08/25/21 2113 09/01/21 2059        Other medications reviewed.     Medical History:  Past Medical History:   Diagnosis Date   ??? Arthritis    ??? Cirrhosis (CMS-HCC)    ??? History of transfusion    ??? Hypertension     under control with meds and weight loss   ??? Liver disease    ??? Stroke (CMS-HCC)     mild stroke   ??? Type 2 diabetes mellitus with diabetic neuropathy, with long-term current use of insulin (CMS-HCC) 06/09/2014     No additional immunocompromising condition except as above.    Surgical History:  Past Surgical History:   Procedure Laterality Date   ??? KNEE SURGERY     ??? PR CATH PLACE/CORON ANGIO, IMG SUPER/INTERP,R&L HRT CATH, L HRT VENTRIC N/A 12/19/2020    Procedure: CATH LEFT/RIGHT HEART CATHETERIZATION;  Surgeon: Rosana Hoes, MD;  Location: [facility] CATH;  Service: Cardiology   ??? PR UPPER GI ENDOSCOPY,BIOPSY N/A 10/12/2020    Procedure: UGI ENDOSCOPY; WITH BIOPSY, SINGLE OR MULTIPLE;  Surgeon: Marene Lenz, MD;  Location: [facility];  Service: Gastroenterology       Social History:  Tobacco use:   reports that he has never smoked. He has never used smokeless tobacco.   Alcohol use:    reports that he does not currently use alcohol.   Drug use:    reports no history of drug use.   Living situation:  Lives with spouse/partner   Residence:   city   [place]     [place] travel:   No [place] travel outside of [place]   International travel:   Has traveled to [place]    Military service:  Has served in the [employer] in [place]    Employment:  Unemployed   Pets and animal exposure:  Animal exposures include 3 chihuahuas   Insect exposure:  No tick exposure   Hobbies:  Denies unusual environmental exposures   TB exposures:  No known TB exposure   Sexual history:  Other significant exposures:       Family History:  Family History   Problem Relation Age of Onset   ??? Alzheimer's disease Father    ??? Aortic dissection Brother        Review of Systems:  As per HPI. All other systems reviewed are negative.          Vital Signs last 24 hours:  Temp:  [36.7 ??C (98 ??F)-37.6 ??C (99.6 ??F)] 37.2 ??C (99 ??F)  Heart Rate:  [89-92] 89  SpO2 Pulse:  [92] 92  Resp:  [18-20] 20  BP: (120-129)/(48-53) 129/53  MAP (mmHg):  [70-74] 74  SpO2:  [93 %-95 %] 95 %    Physical Exam:  Patient Lines/Drains/Airways Status     Active Active Lines, Drains, & Airways     Name Placement date Placement time Site Days    Peripheral IV 08/25/21 Right Antecubital 08/25/21  1540  Antecubital  less than 1              Const [x]  vital signs above    []  NAD, non-toxic appearance [x]  Chronically ill-appearing, non-distressed        Eyes [x]  Lids normal bilaterally, conjunctiva anicteric and noninjected OU     [] PERRL  [] EOMI        ENMT [x]  Normal appearance of external nose and ears, no nasal discharge        [x]  MMM, no lesions on lips or gums [x]  No thrush, leukoplakia, oral lesions  []  Dentition good []  Edentulous []  Dental caries present  []  Hearing normal  []  TMs with good light reflexes bilaterally         Neck [x]  Neck of normal appearance and trachea midline        []  No thyromegaly, nodules, or tenderness   []  Full neck ROM        Lymph []  No LAD in neck     []  No LAD in supraclavicular area     []  No LAD in axillae   []  No LAD in epitrochlear chains     []  No LAD in inguinal areas        CV [x]  RRR            []  No peripheral edema     []  Pedal pulses intact   [x]  No abnormal heart sounds appreciated   []  Extremities WWP         Resp []  Normal WOB at rest    []  No breathlessness with speaking, no coughing  []  CTA anteriorly    []  CTA posteriorly          GI []  Normal inspection, NTND   []  NABS     []  No umbilical hernia on exam       []  No hepatosplenomegaly     []  Inspection of perineal and perianal areas normal  Dull to percussion, distended, NABS, mild tenderness in bilateral lower quadrants       GU []  Normal external genitalia     [x] No urinary catheter present in urethra   []  No CVA tenderness    []  No tenderness over renal allograft        MSK [x]  No clubbing or cyanosis of hands       []  No vertebral point tenderness  [x]  No focal tenderness or abnormalities on palpation of joints in RUE, LUE, RLE, or LLE        Skin [x]  No rashes, lesions, or ulcers of  visualized skin     [x]  Skin warm and dry to palpation         Neuro [x]  Face expression symmetric  []  Sensation to light touch grossly intact throughout    [x]  Moves extremities equally    []  No tremor noted        []  CNs II-XII grossly intact     []  DTRs normal and symmetric throughout []  Gait unremarkable        Psych [x]  Appropriate affect       [x]  Fluent speech         []  Attentive, good eye contact  [x]  Oriented to person, place, time          []  Judgment and insight are appropriate           Data for Medical Decision Making     I reviewed CBC results and noted no leukocytosis, chemistry results and noted mild hypokalemia and micro result(s) and noted ascitic and blood cultures NGTD.    I independently visualized/interpreted ECG tracing and noted NSR.    Recent Labs   Lab Units 08/26/21  0645 08/25/21  1312   WBC 10*9/L 4.7 7.6   HEMOGLOBIN g/dL 9.6* 16.1*   PLATELET COUNT (1) 10*9/L 55* 79*   NEUTRO ABS 10*9/L  --  5.5   LYMPHO ABS 10*9/L  --  1.0*   EOSINO ABS 10*9/L  --  0.0   SODIUM mmol/L 135 135   POTASSIUM mmol/L 3.3* 3.6   BUN mg/dL 12 9   CREATININE mg/dL 0.96 0.45   GLUCOSE mg/dL 409 811   CALCIUM mg/dL 8.2* 8.2*   MAGNESIUM mg/dL 1.6  --    BILIRUBIN TOTAL mg/dL 8.4* 8.4*   AST U/L 36* 58*   ALT U/L 21 34   HEMOGLOBIN A1C %  --  5.8* Microbiology:  Microbiology Results (last day)     Procedure Component Value Date/Time Date/Time    MRSA Screen [[id number]]     Lab Status: No result Specimen: Nares from Nose (nasal passage)     Ascitic/Peritoneal Fluid Culture [[id number]]  (Normal) Collected: 08/25/21 1828    Lab Status: Preliminary result Specimen: Fluid, Ascitic from Abdomen Updated: 08/26/21 0911     Ascitic/Peritoneal Culture NO GROWTH TO DATE    Narrative:      Specimen Source: Abdomen      Blood Culture, Adult [[id number]] Collected: 08/25/21 2215    Lab Status: In process Specimen: Blood from 1 Peripheral Draw Updated: 08/25/21 2240    Blood Culture, Adult [[id number]] Collected: 08/25/21 2215    Lab Status: In process Specimen: Blood from 1 Peripheral Draw Updated: 08/25/21 2240    Body fluid analysis (Cell count and Crystal) [phone]] Collected: 08/25/21 1828    Lab Status: Final result Specimen: Fluid, Ascitic Updated: 08/25/21 1958     Fluid Type Fluid, Ascitic     Color, Fluid Yellow     Appearance, Fluid Cloudy     Nucleated Cells, Fluid 6,800 ul      RBC, Fluid 2,000 ul      Neutrophil %, Fluid 68.0 %      Lymphocytes %, Fluid 4.0 %      Mono/Macro % , Fluid 27.0 %      Other Cells %, Fluid 1.0 %      #Cells Counted BF Diff 100     Fluid Comments Mesothelial cells present.  Macrophages present.  Degenerating cells present.  Imaging:  ECG 12 Lead    Result Date: 08/26/2021  NORMAL SINUS RHYTHM LOW VOLTAGE QRS POOR R WAVE PROGRESSION IN PRECORDIAL LEADS , CONSIDER LEAD PLACEMENT , ANTERIOR INFARCT  , AGE UNDETERMINED WHEN COMPARED WITH ECG OF 21-Jun-2021 13:14, RATE HAS INCREASED LOW VOLTAGE AND POOR R WAVE PROGRESSION IN PRECORDIAL LEADS IS NOW PRESENT CHEST LEAD PLACEMENT CHANGE IS SUSPECTED SUGGEST CLINICAL CORRELATION Confirmed by Schuyler Amor ([id number]) on 08/26/2021 8:51:41 AM    XR Chest Portable    Result Date: 08/26/2021  EXAM: XR CHEST PORTABLE DATE: 08/25/2021 10:16 PM ACCESSION: [id number] UN DICTATED: 08/25/2021 10:22 PM INTERPRETATION LOCATION: [facility]     CLINICAL INDICATION: 61 years old Male with HYPOXEMIA      COMPARISON: 08/22/2021, CT abdomen pelvis 08/25/2021     TECHNIQUE: Portable Chest Radiograph.     FINDINGS:     Low lung volumes. Veiling right pleural effusion. Small left pleural effusion. Minimal bibasilar atelectasis. No pneumothorax Stable cardiomediastinal silhouette.             Small bilateral pleural effusion with bibasilar atelectasis. No consolidation.    CT Abdomen Pelvis W Contrast    Result Date: 08/25/2021  EXAM: CT ABDOMEN PELVIS W CONTRAST DATE: 08/25/2021 6:56 PM ACCESSION: [id number] UN DICTATED: 08/25/2021 8:02 PM INTERPRETATION LOCATION: [facility]     CLINICAL INDICATION: 61 years old with Cirrhosis on liver transplant list with lower abdominal pain for 1 day      COMPARISON: CT 06/06/2021 and ultrasound 07/03/2021     TECHNIQUE: A helical CT scan of the abdomen and pelvis was obtained following IV contrast from the lung bases through the pubic symphysis. Images were reconstructed in the axial plane. Coronal and sagittal reformatted images were also provided for further evaluation.         FINDINGS:     LOWER CHEST: Large right pleural effusion. Bilateral basilar atelectasis. Coronary artery calcifications.     LIVER: Nodular hepatic contour with relative enlargement left hepatic lobe and caudate lobe, consistent with cirrhosis. No focal liver lesions.     BILIARY: No biliary ductal dilatation.  Mild gallbladder wall edema thickening, probably reactive in the setting of chronic liver disease. Correlate with right upper quadrant examination.     SPLEEN: Splenomegaly, measuring 14 cm in craniocaudal dimension     PANCREAS: Normal pancreatic contour.  Cystic lesions, measuring up to 2.0 cm in the uncinate process, increased compared with 05/19/2018 but without associated ductal dilatation or suspicious/aggressive features.     ADRENAL GLANDS: Normal appearance of the adrenal glands.     KIDNEYS/URETERS: Symmetric renal enhancement.  No hydronephrosis.  No solid renal mass.     BLADDER: Unremarkable.     REPRODUCTIVE ORGANS: Unremarkable.     GI TRACT: Mild mural thickening noted in the sigmoid colon which may be related to mild colitis versus underdistention. Appendix is not clearly identified but there is no evidence of appendicitis. Colonic diverticulosis.     PERITONEUM, RETROPERITONEUM AND MESENTERY: No free air.  Moderate ascites.  No fluid collection.     LYMPH NODES: Mildly enlarged mesenteric lymph nodes, probably reactive.     VESSELS: Hepatic and portal veins are patent.  Normal caliber aorta. _Changes in the abdominal aorta and its branches. Small caliber gastroesophageal and splenic varices. Recanalized umbilical vein. Splenorenal shunt noted.     BONES and SOFT TISSUES: No aggressive osseous lesions. Mild osseous degenerative changes. Partially imaged gynecomastia. Small, fat-containing, left inguinal hernia. Tiny, fat-containing, umbilical hernia.  Mural thickening noted in the sigmoid colon, possibly related to underdistention versus mild colitis, appears new compared with 06/06/2021     Cirrhosis with evidence portal hypertension, as above, including moderate ascites and moderate to large right pleural effusion.     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 Largest appears moderately increased in size since 05/19/2018. Follow-up recommended as below     1.5 - 2.5 cm cyst, no MPD communication, OR cannot be determined      --EUS/FNA OR      --Reimage q18mo x 4, then q1y x 2, then q2y x 3           --STOP if stable over 10 years           --If interval Growth                -- > 2.5 cm: EUS/FNA                -- < 2.6 cm: EUS/FNA OR Reimage q82mo x 2, then q1y x 5 then q2y     NOTE:  The management of incidental pancreatic cysts algorithm has been adopted to standardize MRI reporting in patients or various ages at presentation. Criteria and documentation are available online at https://www.jacr.org/article/[email or web address]/pdf. .      Additional Studies:   08/26/21 EKG QTc 431    Serologies:  Lab Results   Component Value Date    CMV IGG Positive (A) 06/05/2021    EBV VCA IgG Antibody Positive (A) 06/05/2021    Hep A IgG Reactive (A) 05/09/2021    Hep B Surface Ag Nonreactive 06/05/2021    Hep B S Ab Nonreactive 06/05/2021    Hep B Surf Ab Quant <8.00 06/05/2021    Hepatitis C Ab Nonreactive 06/05/2021    RPR Nonreactive 06/05/2021    HSV 1 IgG Positive (A) 06/05/2021    HSV 2 IgG Negative 06/05/2021    Varicella IgG Positive 06/05/2021    Rubella IgG Scr Positive 05/09/2021    Toxoplasma Gondii IgG Negative 06/05/2021    Quantiferon TB Gold Plus Interpretation Negative 05/09/2021    Quantiferon Mitogen Minus Nil 5.41 05/09/2021    Quantiferon Antigen 1 minus Nil -0.01 05/09/2021       Immunizations:  Immunization History   Administered Date(s) Administered   ??? COVID-19 VACC,MRNA,(PFIZER)(PF) 10/28/2019, 12/03/2019, 06/05/2020, 08/17/2021   ??? Hepatitis B vaccine, pediatric/adolescent dosage, 01/08/2018, 02/09/2018   ??? Hepatitis B, Adult 12/09/2017, 05/12/2018   ??? Influenza Vaccine Quad (IIV4 PF) 57mo+ injectable 05/25/2019, 06/24/2021   ??? Influenza Virus Vaccine, unspecified formulation 05/01/2018   ??? Pneumococcal Conjugate 13-Valent 12/05/2017   ??? TdaP 12/05/2017   ??? ZOSTAVAX - ZOSTER VACCINE, LIVE, SQ 10/22/2013

## 2021-08-26 NOTE — Unmapped (Signed)
8:00 PM -  IReceived sign out from Dr. Leonides Grills    ED I-PASS Handoff  ?? Illness Severit: stable  ?? Patient Summary: Matthew Key is a 61 y.o. male with history of cirrhosis presenting with fever and abdominal pain recent LVP.  ?? Action List: Ending ascitic fluid studies and CT.  Patient already has empiric antibiotics ordered.  Situation Awareness (Contingency Planning): Anticipate admission  ?? Synthesis by Receiver    ED Course as of 08/26/21 0629   Sat Aug 25, 2021   1958 Patient's ascitic fluid count with 6800 nucleated cells 68% neutrophils concerning for SBP with fever and abdominal pain in a patient with cirrhosis with ascites.  Receiving 2 g cefepime now.  Of page patient for admission.   2040 Patient CT of the abdomen pelvis with evidence of ascites and portal hypertension with some possible colitis.  Discussed with patient work-up concerning for SBP and page patient for admission has been assigned to med U team.  Patient is agreeable with plan for admission.       Vitals:    08/25/21 1835 08/25/21 2200 08/26/21 0249 08/26/21 0430   BP: 129/48 126/51 120/50 129/53   Pulse: 92 89 91 89   Resp: 19 18 18 20    Temp: 36.7 ??C (98 ??F) 37.6 ??C (99.6 ??F) 37.3 ??C (99.1 ??F) 37.2 ??C (99 ??F)   TempSrc: Oral Oral Oral Oral   SpO2: 94% 95% 93% 95%       XR Chest Portable   Preliminary Result      Moderate right and small left pleural effusion with bibasilar atelectasis. No consolidation.      CT Abdomen Pelvis W Contrast   Final Result   Mural thickening noted in the sigmoid colon, possibly related to underdistention versus mild colitis, appears new compared with 06/06/2021      Cirrhosis with evidence portal hypertension, as above, including moderate ascites and moderate to large right pleural effusion.      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 Largest appears moderately increased in size since 05/19/2018. Follow-up recommended as below      1.5 - 2.5 cm cyst, no MPD communication, OR cannot be determined --EUS/FNA OR        --Reimage q16mo x 4, then q1y x 2, then q2y x 3             --STOP if stable over 10 years             --If interval Growth                  -- > 2.5 cm: EUS/FNA                  -- < 2.6 cm: EUS/FNA OR Reimage q93mo x 2, then q1y x 5 then q2y       NOTE:  The management of incidental pancreatic cysts algorithm has been adopted to standardize MRI reporting in patients or various ages at presentation. Criteria and documentation are available online at https://www.jacr.org/article/[email or web address]/pdf.    .          Lab Results   Component Value Date    WBC 7.6 08/25/2021    HGB 12.7 (L) 08/25/2021    HCT 36.5 (L) 08/25/2021    PLT 79 (L) 08/25/2021       Lab Results   Component Value Date    NA 135 08/25/2021    K 3.6 08/25/2021    CL 100  08/25/2021    CO2 26.0 08/25/2021    BUN 9 08/25/2021    CREATININE 1.02 08/25/2021    GLU 120 08/25/2021    CALCIUM 8.2 (L) 08/25/2021    MG 1.5 (L) 06/24/2021    PHOS 3.0 06/05/2021       Lab Results   Component Value Date    BILITOT 8.4 (H) 08/25/2021    BILIDIR 1.40 (H) 06/24/2021    PROT 6.1 08/25/2021    ALBUMIN 2.4 (L) 08/25/2021    ALT 34 08/25/2021    AST 58 (H) 08/25/2021    ALKPHOS 154 (H) 08/25/2021    GGT 28 06/05/2021       Lab Results   Component Value Date    LABPROT 14.0 (H) 08/20/2021    INR 2.10 08/25/2021    APTT 39.2 (H) 08/25/2021

## 2021-08-27 DIAGNOSIS — K7210 Chronic hepatic failure without coma: Principal | ICD-10-CM

## 2021-08-27 LAB — MAGNESIUM: MAGNESIUM: 1.6 mg/dL (ref 1.6–2.6)

## 2021-08-27 LAB — COMPREHENSIVE METABOLIC PANEL
ALBUMIN: 2.5 g/dL — ABNORMAL LOW (ref 3.4–5.0)
ALKALINE PHOSPHATASE: 94 U/L (ref 46–116)
ALT (SGPT): 21 U/L (ref 10–49)
ANION GAP: 5 mmol/L (ref 5–14)
AST (SGOT): 36 U/L — ABNORMAL HIGH (ref <=34)
BILIRUBIN TOTAL: 4.1 mg/dL — ABNORMAL HIGH (ref 0.3–1.2)
BLOOD UREA NITROGEN: 9 mg/dL (ref 9–23)
BUN / CREAT RATIO: 9
CALCIUM: 8.3 mg/dL — ABNORMAL LOW (ref 8.7–10.4)
CHLORIDE: 104 mmol/L (ref 98–107)
CO2: 28 mmol/L (ref 20.0–31.0)
CREATININE: 0.96 mg/dL
EGFR CKD-EPI (2021) MALE: 90 mL/min/{1.73_m2} (ref >=60)
GLUCOSE RANDOM: 147 mg/dL (ref 70–179)
POTASSIUM: 3.5 mmol/L (ref 3.4–4.8)
PROTEIN TOTAL: 5.1 g/dL — ABNORMAL LOW (ref 5.7–8.2)
SODIUM: 137 mmol/L (ref 135–145)

## 2021-08-27 LAB — CBC
HEMATOCRIT: 30 % — ABNORMAL LOW (ref 39.0–48.0)
HEMOGLOBIN: 10.1 g/dL — ABNORMAL LOW (ref 12.9–16.5)
MEAN CORPUSCULAR HEMOGLOBIN CONC: 33.5 g/dL (ref 32.0–36.0)
MEAN CORPUSCULAR HEMOGLOBIN: 33.3 pg — ABNORMAL HIGH (ref 25.9–32.4)
MEAN CORPUSCULAR VOLUME: 99.3 fL — ABNORMAL HIGH (ref 77.6–95.7)
MEAN PLATELET VOLUME: 10.3 fL (ref 6.8–10.7)
PLATELET COUNT: 57 10*9/L — ABNORMAL LOW (ref 150–450)
RED BLOOD CELL COUNT: 3.02 10*12/L — ABNORMAL LOW (ref 4.26–5.60)
RED CELL DISTRIBUTION WIDTH: 15.2 % (ref 12.2–15.2)
WBC ADJUSTED: 4.1 10*9/L (ref 3.6–11.2)

## 2021-08-27 LAB — FIBRINOGEN: FIBRINOGEN LEVEL: 131 mg/dL — ABNORMAL LOW (ref 175–500)

## 2021-08-27 LAB — PROTIME-INR
INR: 2.39
PROTIME: 28 s — ABNORMAL HIGH (ref 9.8–12.8)

## 2021-08-27 MED ADMIN — lactulose oral solution (30 mL cup): 20 g | ORAL | @ 13:00:00

## 2021-08-27 MED ADMIN — phytonadione (vitamin K1) (AQUA-MEPHYTON) 5 mg in sodium chloride (NS) 0.9 % 50 mL IVPB: 5 mg | INTRAVENOUS | @ 13:00:00 | Stop: 2021-08-29

## 2021-08-27 MED ADMIN — potassium chloride CR tablet 20 mEq: 20 meq | ORAL | @ 17:00:00 | Stop: 2021-08-27

## 2021-08-27 MED ADMIN — lactulose oral solution (30 mL cup): 20 g | ORAL | @ 02:00:00

## 2021-08-27 MED ADMIN — gabapentin (NEURONTIN) capsule 400 mg: 400 mg | ORAL | @ 02:00:00

## 2021-08-27 MED ADMIN — cyclobenzaprine (FLEXERIL) tablet 5 mg: 5 mg | ORAL | @ 14:00:00

## 2021-08-27 MED ADMIN — rifAXIMin (XIFAXAN) tablet 550 mg: 550 mg | ORAL | @ 13:00:00 | Stop: 2021-09-01

## 2021-08-27 MED ADMIN — albumin human 25 % bottle 100 g: 100 g | INTRAVENOUS | @ 16:00:00 | Stop: 2021-08-27

## 2021-08-27 MED ADMIN — polyethylene glycol (MIRALAX) packet 17 g: 17 g | ORAL | @ 13:00:00

## 2021-08-27 MED ADMIN — atorvastatin (LIPITOR) tablet 10 mg: 10 mg | ORAL | @ 02:00:00

## 2021-08-27 MED ADMIN — cefTRIAXone (ROCEPHIN) 2 g in sodium chloride 0.9 % (NS) 100 mL IVPB-connector bag: 2 g | INTRAVENOUS | @ 09:00:00 | Stop: 2021-08-31

## 2021-08-27 MED ADMIN — lisinopriL (PRINIVIL,ZESTRIL) tablet 2.5 mg: 2.5 mg | ORAL | @ 23:00:00

## 2021-08-27 MED ADMIN — enoxaparin (LOVENOX) syringe 40 mg: 40 mg | SUBCUTANEOUS | @ 02:00:00

## 2021-08-27 MED ADMIN — pantoprazole (PROTONIX) EC tablet 40 mg: 40 mg | ORAL | @ 13:00:00

## 2021-08-27 MED ADMIN — rifAXIMin (XIFAXAN) tablet 550 mg: 550 mg | ORAL | @ 02:00:00 | Stop: 2021-09-01

## 2021-08-27 MED ADMIN — gabapentin (NEURONTIN) capsule 400 mg: 400 mg | ORAL | @ 13:00:00

## 2021-08-27 MED ADMIN — lactulose oral solution (30 mL cup): 20 g | ORAL | @ 20:00:00

## 2021-08-27 MED ADMIN — insulin lispro (HumaLOG) injection 0-20 Units: 0-20 [IU] | SUBCUTANEOUS | @ 17:00:00

## 2021-08-27 NOTE — Unmapped (Signed)
Diagnostic Paracentesis Procedure Note (CPT [phone])    Pre-procedural Planning     Patient Name:: Matthew Key  Patient MRN: [id number]    Indications: SBP, repeat cell count    Known Bleeding Diathesis: Patient/caregiver denies any known bleeding or platelet disorder.     Antiplatelet Agents: This patient is not on an antiplatelet agent.    Systemic Anticoagulation: This patient is not on full systemic anticoagulation.    Significant Labs:  INR   Date Value Ref Range Status   08/27/2021 2.39  Final   08/20/2021 1.3 (H) 0.9 - 1.2 Final     Comment:     Reference interval is for non-anticoagulated patients.  Suggested INR therapeutic range for Vitamin K  antagonist therapy:     Standard Dose (moderate intensity                    therapeutic range):       2.0 - 3.0     Higher intensity therapeutic range       2.5 - 3.5       PT   Date Value Ref Range Status   08/27/2021 28.0 (H) 9.8 - 12.8 sec Final     APTT   Date Value Ref Range Status   08/25/2021 39.2 (H) 25.1 - 36.5 sec Final     Platelet   Date Value Ref Range Status   08/27/2021 57 (L) 150 - 450 10*9/L Final   07/09/2021 57 (LL) 150 - 450 x10E3/uL Final       Consent: Informed consent was obtained after explanation of the risks (including bleeding, infection, bowel perforation, and leaking) and benefits of the procedure. Refer to the consent documentation.    Procedure Details     Time-out was performed immediately prior to the procedure.    The head of the bed was placed at 45 degrees above level and a large area of ascitic fluid was identified by ultrasound in the left lower quadrant.  The linear probe with color doppler was used to ensure that a vessel was not located in the proposed needle path.            Skin was cleaned with Chlorhexidine. Local anesthesia with 1% lidocaine was introduced subcutaneously then deep to the skin until the parietal peritoneum was anesthetized. A 25 g straight needle was introduced into this site until ascitic fluid was encountered.  Ascitic fluid and the needle were removed with minimal bleeding.  A sterile bandage was placed after holding pressure.     Findings     10 mL of clear and yellow ascites fluid was obtained.    The ascites fluid was sent for Cell count and diff, Albumin and Total Protein.    Condition     The patient tolerated the procedure well and remains in the same condition as pre-procedure.    Complications     Complications:  None; patient tolerated the procedure well.      Requesting Service: Med [facility] (MD[facility])    Time Requested: 0800  Time Completed: 1000    Resident(s) Performing Procedure: Debby Bud  Resident Year: PGY3     ________________________________________________________________  Teaching Physician Attestation  I was present for the entirety of the procedure.     Sinclair Ship  Professor of Internal Medicine and Pediatrics  August 27, 2021 10:49 AM

## 2021-08-27 NOTE — Unmapped (Signed)
OCCUPATIONAL THERAPY  Evaluation (08/27/21 0821)    Patient Name:  Matthew Key       Medical Record Number: [id number]   Date of Birth: 1961-07-26  Sex: Male          OT Treatment Diagnosis:  Decreased activity tolerance    Problem List: Decreased endurance, Pain, Fall risk, Impaired ADLs           Matthew Key is a 61 y.o. 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      Assessment: Pt presents to OT with deficits in activity tolerance 2/2 back pain impacting pt's independence with ADLs and functional mobility/transfers compared to baseline.  Pt completing limited assessment, tolerating minimal EOB activity only prior to onset of sudden back spasms this date. Pt exhibiting ability to reach B feet without AE with some difficulty and CGA for safe sitting balance, however, pt endorses he has AE at home he uses PRN. Pt completing grooming and simulated seated ADLs prior to onset of back pain and exhibits no difficulty reaching for UBD, grooming, feeding. Anticipate pt will progress quickly when able to tolerate OOB mobility. After review of the patient's occupational profile and history, assessment of occupational performance, clinical decision making, and development of POC, the patient presents as a moderate complexity case.        Today's Interventions: Pt educated on role of OT, POC, bed mobility, LBD activities, sitting bal/tol, pain management strategies, cognition, positioning, educ on benefits of OOB and safety with OOB.     Activity Tolerance During Today's Session  Tolerated treatment well, Limited by pain (sudden onset of back spasm)    Plan  Planned Frequency of Treatment:  1-2x per day for: 3-4x week       Planned Interventions:  Education - Patient, Functional mobility, Home exercise program, Education - Family / caregiver, ADL retraining, Balance activities, Bed mobility, Endurance activities, Compensatory tech. training, Conservation, Range of motion, Therapeutic exercise, UE Strength / coordination exercise, Transfer training, Safety education, Postular / Proximal stability, Positioning, Functional cognition    Post-Discharge Occupational Therapy Recommendations:   5x weekly, Low intensity   OT DME Recommendations: Defer to post acute -        GOALS:   Patient and Family Goals: To go home    IP Long Term Goal #1: Pt will score 24/24 on AMPAC within 4 weeks       Short Term:  Pt will complete OOB assessment.   Time Frame : 2 weeks  Pt will complete LBD with MOD I.   Time Frame : 2 weeks  Pt will complete > 5 minute bimanual ADL seated EOB with MOD I.   Time Frame : 2 weeks         Prognosis:  Good  Positive Indicators:  PLOF  Barriers to Discharge: Endurance deficits, Pain    Subjective  Current Status Pt received/left semi-reclined in bed, all needs within reach and RN updated  Prior Functional Status Pt reports being MOD I with ADL/IADL, using sock aid, reacher, and long handled sponge PRN. Pt endorses not using DME for mobility normally, reporting 2 falls in past year (tripping over curb and falling OOB). Pt endorses family assists with transportation and can assist with IADLs as needed. Pt enjoys racing. Of note, pt's wife is receiving radiation for breast cancer.  Medical Tests / Procedures: Reviewed in EPIC       Patient / Caregiver reports: This has never happened before--continuous back spasm    Past Medical History:   Diagnosis Date   ??? Arthritis    ??? Cirrhosis (CMS-HCC)    ??? History of transfusion    ??? Hypertension     under control with meds and weight loss   ??? Liver disease    ??? Stroke (CMS-HCC)     mild stroke   ??? Type 2 diabetes mellitus with diabetic neuropathy, with long-term current use of insulin (CMS-HCC) 06/09/2014    Social History     Tobacco Use   ??? Smoking status: Never   ??? Smokeless tobacco: Never   Substance Use Topics ??? Alcohol use: Not Currently     Comment: light drinker. No drink in over a year .      Past Surgical History:   Procedure Laterality Date   ??? KNEE SURGERY     ??? PR CATH PLACE/CORON ANGIO, IMG SUPER/INTERP,R&L HRT CATH, L HRT VENTRIC N/A 12/19/2020    Procedure: CATH LEFT/RIGHT HEART CATHETERIZATION;  Surgeon: Rosana Hoes, MD;  Location: [facility] CATH;  Service: Cardiology   ??? PR UPPER GI ENDOSCOPY,BIOPSY N/A 10/12/2020    Procedure: UGI ENDOSCOPY; WITH BIOPSY, SINGLE OR MULTIPLE;  Surgeon: Marene Lenz, MD;  [facility]: [facility];  Service: Gastroenterology    Family History   Problem Relation Age of Onset   ??? Alzheimer's disease Father    ??? Aortic dissection Brother         [employer]-honey bee     Objective Findings  Precautions / Restrictions  Falls precautions    Weight Bearing  Non-applicable    Required Braces or Orthoses  Non-applicable    Communication Preference  Verbal    Pain  Endorses 8/10 back pain, notified RN. Repositioning attemped, but pt endorsing significant pain from back spasm    Equipment / Environment  Vascular access (PIV, TLC, Port-a-cath, PICC)    Living Situation  Living Environment: Apartment  Lives With: Spouse (Endorses he has other family (sisters, cousns), who can come assist with IADLs as needed)  Home Living: One level home, Level entry, Standard height toilet, Tub/shower unit, Shower chair with back  Equipment available at home: [employer], [place], Air traffic controller chair with back, TEFL teacher, Sports administrator, Long-handled sponge, Long-handled shoehorn     Cognition   Orientation Level:  Oriented x 4   Arousal/Alertness:  Appropriate responses to stimuli   Attention Span:  Appears intact   Memory:  Appears intact   Following Commands:  Follows all commands and directions without difficulty   Safety Judgment:  Good awareness of safety precautions   Awareness of Errors:  Good awareness of errors made   Problem Solving:  Able to problem solve independently     Vision / Hearing Vision: No acute deficits identified     Hearing: No deficit identified         Hand Function:  Right Hand Function: Right hand grip strength, ROM and coordination WNL  Left Hand Function: Left hand grip strength, ROM and coordination WNL    Skin Inspection:  Skin Inspection: Intact where visualized    ROM / Strength:  UE ROM/Strength: Left WFL, Right WFL  LE ROM/Strength: Left WFL, Right WFL    Coordination:       Sensation:       Balance:  Seated=SBA stiatc, CGA for dynamic    Functional  Mobility  Transfer Assistance Needed:  (NT)  Bed Mobility Assistance Needed: Yes (Supine-sit=Supervision, sit-supine=supervision)  Bed Mobility - Needs Assistance: Supervision  Ambulation: NT      ADLs  ADLs: Needs assistance with ADLs  ADLs - Needs Assistance: LB dressing, Bathing, Grooming, Toileting, UB dressing, Feeding  Feeding - Needs Assistance:  (Independent)  Grooming - Needs Assistance:  (MOD I seated)  Bathing - Needs Assistance: Min assist, Performed seated  Toileting - Needs Assistance: Min assist (anticipated, deferred 2/2 back pain)  UB Dressing - Needs Assistance: Set Up Assist  LB Dressing - Needs Assistance: Contact Guard assist, Performed seated      Vitals / Orthostatics  At Rest: VSS  With Activity: NAD  Vitals/Orthostatics: NA      Medical Staff Made Aware: RN      Occupational Therapy Session Duration  OT Individual [mins]: 24         I attest that I have reviewed the above information.  Signed: Caleen Essex, OT  Filed 08/27/2021

## 2021-08-27 NOTE — Unmapped (Signed)
Report called to Amy, RN on 8BT . Pt A&Ox3, NAD and VSS at time of transfer. Pt made aware of bed assignment. Pt verbalized understanding. Pt transferred in stretcher by hospital transport.

## 2021-08-27 NOTE — Unmapped (Signed)
Internal Medicine (MEDU) Progress Note    Assessment & Plan:   Matthew Key is a 61 y.o. 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      Principal Problem:    Spontaneous bacterial peritonitis (CMS-HCC)  Active Problems:    Non-alcoholic micronodular cirrhosis of liver (CMS-HCC)    Obstructive sleep apnea on CPAP    Thrombocytopenia, unspecified (CMS-HCC)    Anemia    Adrenal insufficiency (CMS-HCC)  Resolved Problems:    * No resolved hospital problems. *    Acute on Chronic Liver Failure 2/2 SBP - NASH Cirrhosis  Admitted w/ abd pain and fever s/p LVP on 1/11 iso unintentional cessation of prophylactic ciprofloxacin. Diagnostic paracentesis with ~4600 PMNs and CT AP w/ mild sigmoid colitis. Pt tolerating ceftriaxone, plan for repeat LVP today to monitor response to abx.  - repeat diagnostic/large volume paracentesis today  - pt inactive on transplant list until peritoneal PMN count < 250  - continue ceftriaxone  - 100g albumin this AM for day 3 of SBP to preserve kidney fxn    Type 2 DM:  Follows with [facility]. A1c 5.8% (08/2021). On Ozempic and Triseba 40U daily. Pt req 1u over 24h despite good PO intake, will continue to monitor on SSI  - SSI   - Hold home ozempic  - Atorvastatin 10mg  daily     HTN:  Normotensive on admission. Held antihypertensives on admission iso active infection, but given stability, will restart home lisinopril  - restart home Lisinopril 2.5mg  daily   - Hold Lasix/spiro  ??  ?CVA vs. TIA:  Carried forward in patient chart since at least 2018, though no evidence of this on CT head 06/2021. Possible TIA? Unclear.   - Atorva 10mg  daily   - No indication to start ASA given uncertainty of diagnosis and bleeding risk with cirrhosis/thrombocytopenia     Secondary Adrenal Insufficiency  Follows w/ [facility], thought to be 2/2 chronic steroid exposure from multiple spinal injections. Previously on hydrocortisone daily though most recent cosyntropin stim test improved from 8.4->22.9. Endocrine consulted, no need for steroids this hospitalization given robust response to stim test and adequate treatment of infection.    Daily Checklist:  Diet: Regular Diet  DVT PPx: Lovenox 40mg  q24h  Electrolytes: Replete Potassium to >/= 3.6 and Magnesium to >/= 1.8  Code Status: Full Code  Dispo: floor    Team Contact Information:   Primary Team: Internal Medicine (MEDU)  Primary Resident: Roanna Raider, MD  Resident's Pager: [phone] (Gen MedU Intern - Cliffton Asters)    Interval History:   - lost IV access overnight, patient was able to get a late dose of ceftriaxone  - denies fever, chills, headache, nausea, vomiting, bloody bowel movements, diarrhea  - ongoing mild abdominal pain, abdominal distention with feeling of bloating  - pt happily eating breakfast, joking about food quality    All other systems were reviewed and are negative except as noted in the HPI    Objective:   Temp:  [36.9 ??C (98.4 ??F)-37.2 ??C (99 ??F)] 36.9 ??C (98.4 ??F)  Heart Rate:  [84-87] 84  Resp:  [18] 18  BP: (124-134)/(54-69) 134/69  SpO2:  [95 %-96 %] 95 %    Gen: WDWN male in NAD, answers questions appropriately  Eyes: sclera anicteric, EOMI  HENT: atraumatic, MMM, OP w/o  erythema or exudate   Heart: RRR, S1, S2, no M/R/G, no chest wall tenderness  Lungs: CTAB, no crackles or wheezes, no use of accessory muscles  Abdomen: Soft, moderate abdominal distention without distention, no rebound or guarding, minimal tenderness to palpation  Extremities: no clubbing, cyanosis, or edema in the BLEs  Neuro: Alert and oriented to person, [name], January 2023, no asterixis on exam, no focal deficits    Labs/Studies: Labs and Studies from the last 24hrs per EMR and Reviewed

## 2021-08-27 NOTE — Unmapped (Signed)
""  VENOUS ACCESS TEAM PROCEDURE    Order was placed for a \""PIV by Venous Access Team (VAT)\"".  Patient was assessed at bedside for placement of a PIV. PPE were donned per protocol.  Access was obtained. Blood return noted.  Dressing intact and device well secured.  Flushed with normal saline.  See LDA for details.  Pt advised to inform RN of any s/s of discomfort at the PIV site.    Workup / Procedure Time:  30 minutes      Thank you,     Roniqua Kintz L Oryon Gary RN Venous Access Team""

## 2021-08-27 NOTE — Unmapped (Signed)
Patient admitted to 8BT. Pt alert and oriented x4. Vital signs are stable on room air. The patient denied pain. IV access lost d/t bleeding at insertion site. Order placed for VAT. MD notified that vitamin K was not given d/t loss of IV access. MD also notified of delay in administering Rocephin d/t long wait times for VAT. Bed remains in lowest position with wheels locked. Call bell and side table are within reach. Will continue POC.    Problem: Adult Inpatient Plan of Care  Goal: Plan of Care Review  Outcome: Progressing  Goal: Patient-Specific Goal (Individualized)  Outcome: Progressing  Goal: Absence of Hospital-Acquired Illness or Injury  Outcome: Progressing  Intervention: Identify and Manage Fall Risk  Recent Flowsheet Documentation  Taken 08/26/2021 2000 by Vanna Scotland, RN  Safety Interventions:  ??? environmental modification  ??? fall reduction program maintained  ??? lighting adjusted for tasks/safety  ??? low bed  Intervention: Prevent Infection  Recent Flowsheet Documentation  Taken 08/26/2021 2000 by Vanna Scotland, RN  Infection Prevention:  ??? single patient room provided  ??? rest/sleep promoted  ??? hand hygiene promoted  ??? personal protective equipment utilized  Goal: Optimal Comfort and Wellbeing  Outcome: Progressing  Goal: Readiness for Transition of Care  Outcome: Progressing  Goal: Rounds/Family Conference  Outcome: Progressing

## 2021-08-28 LAB — COMPREHENSIVE METABOLIC PANEL
ALBUMIN: 3.1 g/dL — ABNORMAL LOW (ref 3.4–5.0)
ALKALINE PHOSPHATASE: 100 U/L (ref 46–116)
ALT (SGPT): 18 U/L (ref 10–49)
ANION GAP: 8 mmol/L (ref 5–14)
AST (SGOT): 36 U/L — ABNORMAL HIGH (ref <=34)
BILIRUBIN TOTAL: 2.8 mg/dL — ABNORMAL HIGH (ref 0.3–1.2)
BLOOD UREA NITROGEN: 6 mg/dL — ABNORMAL LOW (ref 9–23)
BUN / CREAT RATIO: 7
CALCIUM: 8.7 mg/dL (ref 8.7–10.4)
CHLORIDE: 105 mmol/L (ref 98–107)
CO2: 26 mmol/L (ref 20.0–31.0)
CREATININE: 0.89 mg/dL
EGFR CKD-EPI (2021) MALE: >90 mL/min/{1.73_m2} (ref >=60)
GLUCOSE RANDOM: 230 mg/dL — ABNORMAL HIGH (ref 70–179)
POTASSIUM: 3.5 mmol/L (ref 3.4–4.8)
PROTEIN TOTAL: 5.4 g/dL — ABNORMAL LOW (ref 5.7–8.2)
SODIUM: 139 mmol/L (ref 135–145)

## 2021-08-28 LAB — CBC
HEMATOCRIT: 31.4 % — ABNORMAL LOW (ref 39.0–48.0)
HEMOGLOBIN: 10.5 g/dL — ABNORMAL LOW (ref 12.9–16.5)
MEAN CORPUSCULAR HEMOGLOBIN CONC: 33.3 g/dL (ref 32.0–36.0)
MEAN CORPUSCULAR HEMOGLOBIN: 33.3 pg — ABNORMAL HIGH (ref 25.9–32.4)
MEAN CORPUSCULAR VOLUME: 99.8 fL — ABNORMAL HIGH (ref 77.6–95.7)
MEAN PLATELET VOLUME: 10.1 fL (ref 6.8–10.7)
PLATELET COUNT: 58 10*9/L — ABNORMAL LOW (ref 150–450)
RED BLOOD CELL COUNT: 3.15 10*12/L — ABNORMAL LOW (ref 4.26–5.60)
RED CELL DISTRIBUTION WIDTH: 15.2 % (ref 12.2–15.2)
WBC ADJUSTED: 3.2 10*9/L — ABNORMAL LOW (ref 3.6–11.2)

## 2021-08-28 LAB — PROTIME-INR
INR: 2.32
PROTIME: 27.2 s — ABNORMAL HIGH (ref 9.8–12.8)

## 2021-08-28 LAB — MAGNESIUM: MAGNESIUM: 1.7 mg/dL (ref 1.6–2.6)

## 2021-08-28 MED ADMIN — enoxaparin (LOVENOX) syringe 40 mg: 40 mg | SUBCUTANEOUS | @ 03:00:00

## 2021-08-28 MED ADMIN — rifAXIMin (XIFAXAN) tablet 550 mg: 550 mg | ORAL | @ 03:00:00 | Stop: 2021-09-01

## 2021-08-28 MED ADMIN — cefTRIAXone (ROCEPHIN) 2 g in sodium chloride 0.9 % (NS) 100 mL IVPB-connector bag: 2 g | INTRAVENOUS | @ 10:00:00 | Stop: 2021-08-31

## 2021-08-28 MED ADMIN — insulin lispro (HumaLOG) injection 0-20 Units: 0-20 [IU] | SUBCUTANEOUS | @ 23:00:00

## 2021-08-28 MED ADMIN — lactulose oral solution (30 mL cup): 20 g | ORAL | @ 03:00:00

## 2021-08-28 MED ADMIN — phytonadione (vitamin K1) (AQUA-MEPHYTON) 5 mg in sodium chloride (NS) 0.9 % 50 mL IVPB: 5 mg | INTRAVENOUS | @ 15:00:00 | Stop: 2021-08-29

## 2021-08-28 MED ADMIN — pantoprazole (PROTONIX) EC tablet 40 mg: 40 mg | ORAL | @ 14:00:00

## 2021-08-28 MED ADMIN — lisinopriL (PRINIVIL,ZESTRIL) tablet 2.5 mg: 2.5 mg | ORAL | @ 14:00:00

## 2021-08-28 MED ADMIN — cyclobenzaprine (FLEXERIL) tablet 5 mg: 5 mg | ORAL | @ 01:00:00

## 2021-08-28 MED ADMIN — insulin lispro (HumaLOG) injection 0-20 Units: 0-20 [IU] | SUBCUTANEOUS | @ 18:00:00

## 2021-08-28 MED ADMIN — pneumoc 20-val conj-dip cr(PF) (PREVNAR-20) vaccine 0.5 mL: .5 mL | INTRAMUSCULAR | @ 20:00:00 | Stop: 2021-08-28

## 2021-08-28 MED ADMIN — rifAXIMin (XIFAXAN) tablet 550 mg: 550 mg | ORAL | @ 14:00:00 | Stop: 2021-09-01

## 2021-08-28 MED ADMIN — gabapentin (NEURONTIN) capsule 400 mg: 400 mg | ORAL | @ 01:00:00

## 2021-08-28 MED ADMIN — gabapentin (NEURONTIN) capsule 400 mg: 400 mg | ORAL | @ 13:00:00

## 2021-08-28 MED ADMIN — lactulose oral solution (30 mL cup): 20 g | ORAL | @ 19:00:00

## 2021-08-28 MED ADMIN — spironolactone (ALDACTONE) tablet 50 mg: 50 mg | ORAL | @ 22:00:00

## 2021-08-28 MED ADMIN — lactulose oral solution (30 mL cup): 20 g | ORAL | @ 14:00:00

## 2021-08-28 MED ADMIN — atorvastatin (LIPITOR) tablet 10 mg: 10 mg | ORAL | @ 03:00:00

## 2021-08-28 MED ADMIN — polyethylene glycol (MIRALAX) packet 17 g: 17 g | ORAL | @ 14:00:00

## 2021-08-28 MED ADMIN — insulin lispro (HumaLOG) injection 0-20 Units: 0-20 [IU] | SUBCUTANEOUS | @ 13:00:00

## 2021-08-28 MED ADMIN — furosemide (LASIX) tablet 40 mg: 40 mg | ORAL | @ 20:00:00

## 2021-08-28 NOTE — Unmapped (Signed)
POC discussed with pt, pt alert and oriented x4, shows no signs of distress, VSS. No complaints of pain. Bed wheels locked, nonskid footwear applied OOB, call bell within reach, encouraged to use call bell for assistance, no falls during shift  Problem: Adult Inpatient Plan of Care  Goal: Plan of Care Review  Outcome: Progressing  Goal: Patient-Specific Goal (Individualized)  Outcome: Progressing  Goal: Absence of Hospital-Acquired Illness or Injury  Outcome: Progressing  Goal: Optimal Comfort and Wellbeing  Outcome: Progressing  Goal: Readiness for Transition of Care  Outcome: Progressing  Goal: Rounds/Family Conference  Outcome: Progressing

## 2021-08-28 NOTE — Unmapped (Signed)
Hepatology Consult Service   Progress Note         Assessment & Plan:   Matthew Key is a 61 y.o. 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 Pt is seen in consultation at the request of Matthew Capra, MD (Med [facility] (MDU)) for cirrhosis with SBP.    Continues to clinically improve with treatment of SBP. Renal function is stable and T bili is downtrending. Plan for repeat diagnostic paracentesis today.    Recommendations  - appreciate ICID recommendations  - agree with repeat paracentesis today - follow-up cell count and culture  - ok with LVP tomorrow for comfort if needed - would not remove more than about 3L.  - Continue holding home diuretics  - Administer 25% albumin 1g/kg today  - continue home hydrocortisone  - Patient will become inactive on transplant list while treating SBP  - He will need to resume ciprofloxacin for SBP prophylaxis upon discharge  - Will need to 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 We will continue to follow along with you. This patient was seen and examined with Dr. Sherryll Burger.     For questions, please contact the on-call fellow for the Hepatology Consult Service at [phone].     Interval History:   No acute events overnight.     Objective:   Temp:  [36.9 ??C (98.4 ??F)-37.2 ??C (99 ??F)] 36.9 ??C (98.4 ??F)  Heart Rate:  [84-87] 84  Resp:  [18] 18  BP: (124-134)/(54-69) 134/69  SpO2:  [95 %-96 %] 95 %    Gen: WDWN male in NAD, answers questions appropriately  Eyes: Sclera icteric, EOMI  Heart: RRR  Lungs: CTAB, no use of accessory muscles  Abdomen:  soft, NTND, no rebound/guarding  Extremities: No clubbing, cyanosis  Psych: Alert, appropriate mood and affect    Labs/Studies: Labs and Studies from the last 24hrs per EMR and Reviewed

## 2021-08-28 NOTE — Unmapped (Addendum)
PHYSICAL THERAPY  Evaluation (08/27/21 1620)          Patient Name:?? [facility]????   Medical Record Number: [id number]   Date of Birth: 09-Aug-1961  Sex: Male??  ??    Treatment Diagnosis: impairments of functional mobility, balance, gait, and endurance     Activity Tolerance: Tolerated treatment well     ASSESSMENT  Problem List: Impaired balance, Decreased endurance, Decreased mobility, Fall risk, Gait deviation      Per medical chart: Riggs Dineen is a 61 y.o. male with a PMHx of cryptogenic (likely NASH)??cirhossis (listed for transplant), T2DM, HTN, and CVA that presented to the??[place]??ED??with acute onset abdominal pain and fever, found to have SBP.????    Assessment : Patient presents to initial PT evaluation with the PMHx above in addition to impairments of standing balance, gait, and endurance contributing to decreased safety and independence with functional mobility. Patient tolerated limited hallway ambulation and benefited from using a rolling walker as he exhibited impaired standing balance and mild gait instability without UE support. Patient will benefit from skilled PT services to address mobility deficits, improve activity tolerance, optimize functional mobility, and provide ongoing education.  Considering patient's CLOF is below his PLOF but he has good family support at discharge, current recommendation is for post-acute PT at a frequency of 5x/week in a low intensity setting. After a review of the personal factors, co-morbidities, clinical presentation, and examination of the number of affected body systems, the patient presents as a low complexity case.    Today's Interventions: Initial PT evaluation, mobility assessment, and discharge planning. Bed mobility, functional standing balance via pt using bathroom with supervision and grab bar, and transfers and gait without and with RW. Education provided regarding acute PT role and plan of care, importance/benefits of functional mobility, fall/safety precautions, and upright positioning to tolerance.    After completing mobility tasks during today's therapy session on 08/27/2021, Patient was educated on the benefits of regular activity between therapy sessions. The patient was also educated on the role of the Rehab Mobility Team to assist with maintenance activity during this admission; patient indicated understanding.          PLAN  Planned Frequency of Treatment:?? 1-2x per day for: 2-3x week       Planned Interventions: Balance activities, Education - Patient, Education - Family / caregiver, Endurance activities, Functional mobility, Investment banker, operational, Home exercise program, Therapeutic exercise, Therapeutic activity, Transfer training, Neuromuscular re-education     Post-Discharge Physical Therapy Recommendations:?? 3x weekly     PT DME Recommendations: None (Pt owns SPC and RW)??????????       Goals:   Patient and Family Goals: none contributed     Long Term Goal #1: In 4 weeks, pt will ambulate 600 ft modified indep with LRAD to improve endurance.        SHORT GOAL #1: Pt will demo functional transfers modified indep with LRAD.  ?????????????????????? Time Frame : 2 weeks  SHORT GOAL #2: Pt will ambulate 300 ft modified indep with LRAD.  ?????????????????????? Time Frame : 2 weeks     ??????????????????????    Prognosis:?? Good  Positive Indicators: PLOF, CLOF, participation, caregiver support  Barriers to Discharge: None     SUBJECTIVE  Patient reports: Pt agreeable to PT. Pt reports he is hoping he can be placed back on the transplant list.  Current Functional Status: Pt presents/left in L side-lying position in bed, bed alarm on, call bell within reach, and all immediate needs  met; left with provider at bedside  Services patient receives: PT [facility] PT; working on endurance and balance)  Prior Functional Status: Pt reports he was ambulating limited community distances indep, did not use an assistive device in his home and uses buggy at grocery store. Pt repots 1 falls in the past 6 months when he feel out of bed and attributed it to unknown elevated ammonia levels. Pt drives.  Equipment available at home: [employer], [place], [employer] chair with back, Sock aid, Reacher, Long-handled sponge, Long-handled shoehorn      Past Medical History:   Diagnosis Date   ??? Arthritis    ??? Cirrhosis (CMS-HCC)    ??? History of transfusion    ??? Hypertension     under control with meds and weight loss   ??? Liver disease    ??? Stroke (CMS-HCC)     mild stroke   ??? Type 2 diabetes mellitus with diabetic neuropathy, with long-term current use of insulin (CMS-HCC) 06/09/2014            Social History     Tobacco Use   ??? Smoking status: Never   ??? Smokeless tobacco: Never   Substance Use Topics   ??? Alcohol use: Not Currently     Comment: light drinker. No drink in over a year .       Past Surgical History:   Procedure Laterality Date   ??? KNEE SURGERY     ??? PR CATH PLACE/CORON ANGIO, IMG SUPER/INTERP,R&L HRT CATH, L HRT VENTRIC N/A 12/19/2020    Procedure: CATH LEFT/RIGHT HEART CATHETERIZATION;  Surgeon: Rosana Hoes, MD;  Location: [facility] CATH;  Service: Cardiology   ??? PR UPPER GI ENDOSCOPY,BIOPSY N/A 10/12/2020    Procedure: UGI ENDOSCOPY; WITH BIOPSY, SINGLE OR MULTIPLE;  Surgeon: Marene Lenz, MD;  [facility];  Service: Gastroenterology             Family History   Problem Relation Age of Onset   ??? Alzheimer's disease Father    ??? Aortic dissection Brother         Allergies: Venom-honey bee         Objective Findings  Precautions / Restrictions  Precautions: Falls precautions  Weight Bearing Status: Non-applicable  Required Braces or Orthoses: Non-applicable                Pain Comments: 8/10 low back pain, educated pt on log rolling for pain management and use of pillows to optimize positioning  Medical Tests / Procedures: reviewed in Epic  Equipment / Environment: Vascular access (PIV, TLC, Port-a-cath, PICC), Patient not wearing mask for full session     At Rest: NAD  With Activity: post-activity: SpO2 93% on RA, HR 82  Orthostatics: asymptomatic        Living Situation  Living Environment: Apartment  Lives With: Spouse (Endorses additional family is near and able to assist at needed)  Home Living: One level home, Level entry, Standard height toilet, Tub/shower unit, Shower chair with back      Cognition comment: A&Ox4       Upper Extremities  UE comment: moves BUEs voluntarily and anti-gravity    Lower Extremities  LE comment: moves BLEs voluntarily and anti-gravity     Balance comment: static sitting EOB: indep. standing: SBA with BUE support on RW. FTEO: 10, minimal sway. FTEC: 10, inc sway. tandem stance, R/L: 2-4. SLS: unable to perform without UE support      Bed Mobility:  Supine to Sit  Bed Mobility: supine <> sit EOB x2 reps with SBA, education to perform via log rolling to assist with pain management during 2nd rep with pt demonstrating evidence of understanding     Transfers: Sit to Stand  Transfer comments: sit EOB <> stand with SBA, performed x1 rep with RW and x1 rep without an assistive device; education on hand placement when using      Gait: amb ~50 ft with SBA and RW; demos steady pace, no overt LOB. amb ~8 ft x2 with SBA and no assistive device, demos decreased stability and slower pace     Stairs: N/A, level entry to home        Endurance: decreased from baseline; 5/10 modified Borg RPE after mobility     Physical Therapy Session Duration  PT Individual [mins]: 24     Medical Staff Made Aware: RN Linwood cleared pt for PT and updated following     I attest that I have reviewed the above information.  Signed: Gustavus Bryant, PT  Filed 08/27/2021

## 2021-08-28 NOTE — Unmapped (Signed)
Care Management  Initial Transition Planning Assessment    CM met with patient in pt room.  Pt was not wearing hospital provided masks for the duration of the interaction. CM was wearing hospital provided surgical mask.  CM was not within 6 foot of the patient/visitors during this interaction. Pt lives with his wife. He uses a cane and CPAP. He is open to [facility] and agrees for [facility] with the same [facility] agency.    Per H&P: Matthew Key is a 61 y.o. 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                General  Care Manager assessed the patient by : In person interview with patient, Medical record review, Discussion with Clinical Care team  Orientation Level: Oriented X4  Functional level prior to admission: Independent  Who provides care at home?: N/A  Reason for referral: Discharge Planning, Home Health   Type of Residence: Mailing Address:  [address]  [place] [address]  Contacts: Accompanied by: Alone   Extended Emergency Contact Information  Primary Emergency Contact: [name], [name]  Mobile Phone: [phone]  Relation: Spouse  Patient Phone Number: [phone] (home)         Medical Provider(s): Matthew Brooklyn, MD  Reason for Admission: Admitting Diagnosis:  SBP (spontaneous bacterial peritonitis) (CMS-HCC) [K65.2]  Past Medical History:   has a past medical history of Arthritis, Cirrhosis (CMS-HCC), History of transfusion, Hypertension, Liver disease, Stroke (CMS-HCC), and Type 2 diabetes mellitus with diabetic neuropathy, with long-term current use of insulin (CMS-HCC) (06/09/2014).  Past Surgical History:   has a past surgical history that includes pr upper gi endoscopy,biopsy (N/A, 10/12/2020); pr cath place/coron angio, img super/interp,r&l hrt cath, l hrt ventric (N/A, 12/19/2020); and Knee surgery.   Previous admit date: 06/21/2021    Primary Insurance- Payor: HUMANA MEDICARE ADV / Plan: HUMANA GOLD PLUS HMO / Product Type: *No Product type* /   Secondary Insurance - None  Prescription Coverage - same as above  Preferred Pharmacy - [id number] - [place], [place] - [address]  [facility] - [place] - [address]  [facility] (NOW [facility]) - [place], [place] - [address]    Transportation home: Private vehicle    Contact/Decision Maker  Extended Emergency Contact Information  Primary Emergency Contact: [name], [name]  Mobile Phone: [phone]  Relation: Spouse    Legal Next of Kin / Guardian / POA / Advance Directives     HCDM (patient stated preference): [name], [name] - Spouse - [phone]    Advance Directive (Medical Treatment)  Does patient have an advance directive covering medical treatment?: Patient does not have advance directive covering medical treatment.  Reason patient does not have an advance directive covering medical treatment:: Patient needs follow-up to complete one.    Health Care Decision Maker [HCDM] (Medical & Mental Health Treatment)  Healthcare Decision Maker: HCDM documented in the HCDM/Contact Info section.  Information offered on HCDM, Medical & Mental Health advance directives:: Patient declined information.    Readmission Information    Have you been hospitalized in the last 30 days?: No    Did the following happen with your discharge?  Patient Information  Lives with: Spouse/significant other    Type of Residence: Private residence     Location/Detail: [place]    Support Systems/Concerns: Spouse, Family Members    Responsibilities/Dependents at home?: No    Home Care services in place prior to admission?: Yes  Type of Home Care services in place prior to admission: Home OT, Home PT  Current Home Care provider (Name/Phone #): [facility]    Equipment Currently Used at Home: cane, straight, respiratory supplies (CPAP)  Current HME Agency (Name/Phone #): CPAP supplies from [employer]    Currently receiving outpatient dialysis?: No    Financial Information    Need for financial assistance?: No    Social Determinants of Health  Social Determinants of Health     Food Insecurity: No Food Insecurity   ??? Worried About Programme researcher, broadcasting/film/video in the Last Year: Never true   ??? Ran Out of Food in the Last Year: Never true   Tobacco Use: Low Risk    ??? Smoking Tobacco Use: Never   ??? Smokeless Tobacco Use: Never   ??? Passive Exposure: Not on file   Transportation Needs: No Transportation Needs   ??? Lack of Transportation (Medical): No   ??? Lack of Transportation (Non-Medical): No   Alcohol Use: Not on file   Housing/Utilities: Low Risk    ??? Within the past 12 months, have you ever stayed: outside, in a car, in a tent, in an overnight shelter, or temporarily in someone else's home (i.e. couch-surfing)?: No   ??? Are you worried about losing your housing?: No   ??? Within the past 12 months, have you been unable to get utilities (heat, electricity) when it was really needed?: No   Substance Use: Not on file   Financial Resource Strain: Low Risk    ??? Difficulty of Paying Living Expenses: Not hard at all   Physical Activity: Not on file   Health Literacy: Not on file   Stress: Not on file   Intimate Partner Violence: Not on file   Depression: Not at risk   ??? PHQ-2 Score: 0   Social Connections: Not on file     Complex Discharge Information    Is patient identified as a difficult/complex discharge?: No     Discharge Needs Assessment  Concerns to be Addressed: care coordination/care conferences, adjustment to diagnosis/illness    Clinical Risk Factors: New Diagnosis    Barriers to taking medications: No    Prior overnight hospital stay or ED visit in last 90 days: Yes    Anticipated Changes Related to Illness: none    Equipment Needed After Discharge: none    Discharge Facility/Level of Care Needs: other (see comments) (HH)    Readmission  Risk of Unplanned Readmission Score: UNPLANNED READMISSION SCORE: 21.89%  Predictive Model Details          22% (Medium)  Factor Value    Calculated 08/28/2021 12:07 22% Number of active Rx orders 35    [place] Risk of Unplanned Readmission Model 15% Number of ED visits in last six months 3     13% Number of hospitalizations in last year 3     9% ECG/EKG order present in last 6 months     7% Diagnosis of electrolyte disorder present     6% Imaging order present in last 6 months     5% Latest hemoglobin low (10.5 g/dL)     4% Age 62     4% Diagnosis of  deficiency anemia present     4% Active anticoagulant Rx order present     4% Active corticosteroid Rx order present     2% Current length of stay 2.621 days     2% Charlson Comorbidity Index 2     2% Future appointment scheduled     1% Active ulcer medication Rx order present      Readmitted Within the Last 30 Days? (No if blank)   Patient at risk for readmission?: Yes    Discharge Plan  Screen findings are: Discharge planning needs identified or anticipated (Comment).    Expected Discharge Date: 08/29/2021    Expected Transfer from Critical Care:  (N/A)    Quality data for continuing care services shared with patient and/or representative?: Yes  Patient and/or family were provided with choice of facilities / services that are available and appropriate to meet post hospital care needs?: Yes   List choices in order highest to lowest preferred, if applicable. : ROC with [facility]    Initial Assessment complete?: Yes

## 2021-08-28 NOTE — Unmapped (Signed)
Pt admitted with SBP, Pt aslo now MRSA+, Pt continues on rocephin. VSS. Alert oriented. Some complaints of back spasms due to the hospital bed, flexeril given with good relief. ACHS monitored, no SSI required this shift. Pt sleeping since 10pm on CPAP and continuous oxygen monitoring. Potential LVP today. Pt voiced understanding of POC.  Problem: Adult Inpatient Plan of Care  Goal: Plan of Care Review  Outcome: Progressing  Goal: Patient-Specific Goal (Individualized)  Outcome: Progressing  Flowsheets (Taken 08/28/2021 0159)  Patient-Specific Goals (Include Timeframe): Pt will have no new s/s of infections before discharge  Individualized Care Needs: monitor vs/labs, ACHS, falls and safety precautions lactulose  Anxieties, Fears or Concerns: none voiced  Goal: Absence of Hospital-Acquired Illness or Injury  Outcome: Progressing  Intervention: Identify and Manage Fall Risk  Recent Flowsheet Documentation  Taken 08/27/2021 2200 by Valeta Harms, RN  Safety Interventions: isolation precautions  Taken 08/27/2021 2038 by Valeta Harms, RN  Safety Interventions:   environmental modification   lighting adjusted for tasks/safety   nonskid shoes/slippers when out of bed  Intervention: Prevent and Manage VTE (Venous Thromboembolism) Risk  Recent Flowsheet Documentation  Taken 08/27/2021 2200 by Valeta Harms, RN  VTE Prevention/Management: anticoagulant therapy  Goal: Optimal Comfort and Wellbeing  Outcome: Progressing  Goal: Readiness for Transition of Care  Outcome: Progressing  Goal: Rounds/Family Conference  Outcome: Progressing     Problem: Infection  Goal: Absence of Infection Signs and Symptoms  Outcome: Progressing  Intervention: Prevent or Manage Infection  Recent Flowsheet Documentation  Taken 08/27/2021 2200 by Valeta Harms, RN  Isolation Precautions: contact precautions initiated

## 2021-08-28 NOTE — Unmapped (Signed)
IMMUNOCOMPROMISED HOST INFECTIOUS DISEASE PROGRESS NOTE    Assessment/Plan:     [facility] is a 61 y.o. 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 F/u Ascitic fluid cx 1/14 (NGTD)  ??? F/u 1/14 BCx (NGTD)    Management  For SBP  ??? Continue ceftriaxone for SBP, anticipate 5 days of therapy until 08/29/21  ??? Transplant should be held until 08/29/21 (completion of SBP treatment, PMN 282 on 1/16)     For poor dentition  ??? Referral to [facility] dental as he likely needs several teeth pulled although there is no pain concerning for active infection at this time. I would not place him on hold for this given absence of current infection.    Antimicrobial prophylaxis required for  immunosupression associated with cirrhosis and need for transplant  acute, systemic symptoms  [mod]  ??? Anticipate restarting cipro 500mg  daily for SBP ppx on 08/30/21  ??? All Vaccines are up to date.    Intensive toxicity monitoring for prescription antimicrobials   ??? CBC w/diff and CMP monthly while on Ciprofloxacin     Modified perioperative antimicrobials  ?? He should receive daptomycin 6mg /kg perioperatively as well as pip-tazo due to MRSA colonization    The [facility] ID APP service will sign off. No outpatient ID follow up anticipated.         Please page Darolyn Rua, NP at [phone] from 8-4:30pm, after 4:30 pm & on weekends, please page the ID Transplant/Liquid Oncology Fellow consult at [phone] with questions.    Patient discussed with Dr. Reynold Bowen.      Darolyn Rua, MSN, APRN, AGNP-C  Immunocompromised Infectious Disease Nurse Practitioner    Attending attestation  I saw and evaluated the patient, participating in the key portions of the service.  I reviewed the resident???s note.  I agree with the resident???s findings and plan.   Ephraim Hamburger, MD    Subjective:       External records: no external records were reviewed.    Independent historian: no independent historian required.       Interval History:   Afebrile. No complaints today.    Medications:  Current Medications as of 08/28/2021  Scheduled  PRN   atorvastatin, 10 mg, Nightly  cefTRIAXone, 2 g, Q24H  enoxaparin (LOVENOX) injection, 40 mg, Q24H  gabapentin, 400 mg, BID  insulin lispro, 0-20 Units, ACHS  lactulose, 20 g,  TID  lisinopriL, 2.5 mg, Daily  pantoprazole, 40 mg, Daily  phytonadione (AQUA-MEPHYTON) intravenous, 5 mg, Daily  polyethylene glycol, 17 g, Daily  rifAXIMin, 550 mg, BID      acetaminophen, 650 mg, Q8H PRN  cyclobenzaprine, 5 mg, TID PRN  dextrose in water, 12.5 g, Q10 Min PRN  glucagon, 1 mg, Once PRN  glucose, 16 g, Q10 Min PRN  melatonin, 3 mg, Nightly PRN  ondansetron, 4 mg, Q8H PRN         Objective:     Vital Signs last 24 hours:  Temp:  [36.9 ??C (98.4 ??F)-37.3 ??C (99.1 ??F)] 37.3 ??C (99.1 ??F)  Heart Rate:  [82-84] 82  Resp:  [18] 18  BP: (124-134)/(61-69) 124/61  MAP (mmHg):  [82-88] 82  FiO2 (%):  [21 %] 21 %  SpO2:  [93 %-96 %] 96 %    Physical Exam:   Patient Lines/Drains/Airways Status       Active Active Lines, Drains, & Airways       Name Placement date Placement time Site Days    Peripheral IV 08/27/21 Anterior;Distal;Right Forearm 08/27/21  0406  Forearm  1                  Const [x]  vital signs above    []  NAD, non-toxic appearance []  Chronically ill-appearing, non-distressed        Eyes [x]  Lids normal bilaterally, conjunctiva anicteric and noninjected OU     [] PERRL  [] EOMI        ENMT [x]  Normal appearance of external nose and ears, no nasal discharge        [x]  MMM, no lesions on lips or gums []  No thrush, leukoplakia, oral lesions  []  Dentition good []  Edentulous []  Dental caries present  []  Hearing normal  []  TMs with good light reflexes bilaterally         Neck [x]  Neck of normal appearance and trachea midline        []  No thyromegaly, nodules, or tenderness   []  Full neck ROM        Lymph []  No LAD in neck     []  No LAD in supraclavicular area     []  No LAD in axillae   []  No LAD in epitrochlear chains     []  No LAD in inguinal areas        CV [x]  RRR            []  No peripheral edema     [x]  Pedal pulses intact   [x]  No abnormal heart sounds appreciated   []  Extremities WWP         Resp [x]  Normal WOB at rest    [x]  No breathlessness with speaking, no coughing  [x]  CTA anteriorly    []  CTA posteriorly          GI []  Normal inspection, NTND   []  NABS     []  No umbilical hernia on exam       []  No hepatosplenomegaly     []  Inspection of perineal and perianal areas normal  Distended abdomen; no pain with palpation      GU []  Normal external genitalia     [x] No urinary catheter present in urethra   []  No CVA tenderness    []  No tenderness over renal allograft        MSK [x]  No clubbing or cyanosis of hands       []  No vertebral point tenderness  []   No focal tenderness or abnormalities on palpation of joints in RUE, LUE, RLE, or LLE        Skin [x]  No rashes, lesions, or ulcers of visualized skin     [x]  Skin warm and dry to palpation         Neuro [x]  Face expression symmetric  []  Sensation to light touch grossly intact throughout    [x]  Moves extremities equally    []  No tremor noted        []  CNs II-XII grossly intact     []  DTRs normal and symmetric throughout []  Gait unremarkable        Psych [x]  Appropriate affect       [x]  Fluent speech         [x]  Attentive, good eye contact  [x]  Oriented to person, place, time          []  Judgment and insight are appropriate             Data for Medical Decision Making     I discussed peritransplant management was discussed with the hepatology NP, the outpatient transplant coordinator, and the primary care team. .    I reviewed CBC results (remains anemic with thrombocytopenia) and chemistry results (LFTs stable).    I independently visualized/interpreted not done.       Recent Labs   Lab Units 08/27/21  0826 08/26/21  0645 08/25/21  1312   WBC 10*9/L 4.1   < > 7.6   HEMOGLOBIN g/dL 96.2*   < > 95.2*   PLATELET COUNT (1) 10*9/L 57*   < > 79*   NEUTRO ABS 10*9/L  --   --  5.5   LYMPHO ABS 10*9/L  --   --  1.0*   EOSINO ABS 10*9/L  --   --  0.0   BUN mg/dL 9   < > 9   CREATININE mg/dL 8.41   < > 3.24   AST U/L 36*   < > 58*   ALT U/L 21   < > 34   BILIRUBIN TOTAL mg/dL 4.1*   < > 8.4*   ALK PHOS U/L 94   < > 154*   POTASSIUM mmol/L 3.5   < > 3.6   MAGNESIUM mg/dL 1.6   < >  --    CALCIUM mg/dL 8.3*   < > 8.2*    < > = values in this interval not displayed.       Microbiology:  Microbiology Results (last day)       Procedure Component Value Date/Time Date/Time    Blood Culture, Adult [[id number]]  (Normal) Collected: 08/25/21 2215    Lab Status: Preliminary result Specimen: Blood from 1 Peripheral Draw Updated: 08/27/21 2245     Blood Culture, Routine No Growth at 48 hours    Blood Culture, Adult [[id number]]  (Normal) Collected: 08/25/21 2215    Lab Status: Preliminary result Specimen: Blood from 1 Peripheral Draw Updated: 08/27/21 2245     Blood Culture, Routine No Growth at 48 hours    MRSA Screen [[id number]]  (Abnormal) Collected: 08/27/21 2018    Lab Status: Final result Specimen: Nares from Nose (nasal passage) Updated: 08/27/21 2131     MRSA PCR Detected    Narrative:      This result was obtained using the FDA-cleared Cepheid Xpert MRSA NxG assay, a qualitative in vitro diagnostic test intended for the detection of methicillin-resistant Staphylococcus aureus (MRSA) DNA directly from nasal swabs. The test utilizes  automated real-time polymerase chain reaction (PCR) for the amplification of MRSA- specific DNA targets and fluorogenic target-specific hybridization probes for the real-time detection of the amplified DNA. The assay's performance characteristics have been verified by the [facility].    Body fluid cell count [[id number]] Collected: 08/27/21 1100    Lab Status: Final result Specimen: Fluid, Peritoneal from Other-enter as order comment Updated: 08/27/21 1331     Fluid Type Fluid, Peritoneal     Color, Fluid Orange     Appearance, Fluid Hazy     Nucleated Cells, Fluid 1,008 ul      Comment: Clumps present; the number of cells reported may be an underestimate of actual cell count.        RBC, Fluid 2,125 ul      Neutrophil %, Fluid 28.0 %      Lymphocytes %, Fluid 23.0 %      Mono/Macro % , Fluid 45.0 %      Basophils %, Fluid 2.0 %      Other Cells %, Fluid 2.0 %      #Cells Counted BF Diff 100     Fluid Comments Toxic Vacuolation present.  Degenerating cells present.  Macrophages present.  Mesothelial cells present.  Reactive mesothelial cells present.       Albumin Level, Body Fluid [[id number]] Collected: 08/27/21 1100    Lab Status: Final result Specimen: Fluid, Peritoneal from Other-enter as order comment Updated: 08/27/21 1310     Albumin, Fluid <0.5 g/dL      Comment: Serum to peritoneal fluid albumin gradients >=1.1 g/dL suggest portal hypertension.   Serum to pleural fluid albumin gradients >1.2 g/dL suggest transuative effusion.        Albumin, Fluid Type Fluid, Peritoneal    Narrative:      The reference range for this body fluid has not been established. The test result must be integrated into the clinical context for interpretation.  This test was developed and its performance characteristics determined by the [facility] of the [employer], [employer]. This test has not been cleared or approved by the FDA. The laboratory is regulated under CAP and CLIA as qualified to perform high-complexity testing. This test is to be used for clinical purposes and should not be regarded as investigational or for research.    Protein, Body Fluid [[id number]] Collected: 08/27/21 1100    Lab Status: Final result Specimen: Fluid, Peritoneal from Other-enter as order comment Updated: 08/27/21 1151     Protein, Fluid <2.0 g/dL      Comment: Pleural fluid protein to serum protein ratios >0.5 suggest exudative effusion. Pleural fluid protein >4.0 g/dL suggests a TB related effusion.  Pleural fluid protein 7-8 g/dL  suggests MM or Waldenstroms.         Protein, Fluid Type Fluid, Peritoneal    Narrative:      The reference range for this body fluid has not been established. The test result must be integrated into the clinical context for interpretation.  This test was developed and its performance characteristics determined by the [facility] of the [employer] [employer]. This test has not been cleared or approved by the FDA. The laboratory is regulated under CAP and CLIA as qualified to perform high-complexity testing. This test is to be used for clinical purposes and should not be regarded as investigational or for research.    Ascitic/Peritoneal Fluid Culture [[id number]]  (Normal) Collected: 08/25/21 1828    Lab Status: Preliminary  result Specimen: Fluid, Ascitic from Abdomen Updated: 08/27/21 0924     Ascitic/Peritoneal Culture NO GROWTH TO DATE    Narrative:      Specimen Source: Abdomen              Imaging:  No new imaging    Additional Studies:   08/25/21 EKG QTc 

## 2021-08-28 NOTE — Unmapped (Signed)
Internal Medicine (MEDU) Progress Note    Assessment & Plan:   Matthew Key is a 61 y.o. 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      Principal Problem:    Spontaneous bacterial peritonitis (CMS-HCC)  Active Problems:    Non-alcoholic micronodular cirrhosis of liver (CMS-HCC)    Obstructive sleep apnea on CPAP    Thrombocytopenia, unspecified (CMS-HCC)    Anemia    Adrenal insufficiency (CMS-HCC)  Resolved Problems:    * No resolved Key problems. *    Acute on Chronic Liver Failure 2/2 SBP - NASH Cirrhosis  Admitted w/ abd pain and fever s/p LVP on 1/11 iso unintentional cessation of prophylactic ciprofloxacin. Diagnostic paracentesis with ~4600 PMNs and CT AP w/ mild sigmoid colitis. Pt tolerating ceftriaxone, plan for consolidate LVP with diag para to monitor response to abx.  - trend ascites labs on diag para   - pt inactive on transplant list until peritoneal PMN count < 250  - continue ceftriaxone    Type 2 DM:  Follows with [facility]. A1c 5.8% (08/2021). On Ozempic and Triseba 40U daily. Pt req 1u over 24h despite good PO intake, will continue to monitor on SSI  - SSI   - Hold home ozempic  - Atorvastatin 10mg  daily     HTN:  Normotensive on admission. Held antihypertensives on admission iso active infection, but given stability, will restart home lisinopril  - restart home Lisinopril 2.5mg  daily   - Hold Lasix/spiro  ??  ?CVA vs. TIA:  Carried forward in patient chart since at least 2018, though no evidence of this on CT head 06/2021. Possible TIA? Unclear.   - Atorva 10mg  daily   - No indication to start ASA given uncertainty of diagnosis and bleeding risk with cirrhosis/thrombocytopenia     Secondary Adrenal Insufficiency  Follows w/ [facility], thought to be 2/2 chronic steroid exposure from multiple spinal injections. Previously on hydrocortisone daily though most recent cosyntropin stim test improved from 8.4->22.9. Key consulted, no need for steroids this hospitalization given robust response to stim test and adequate treatment of infection.    Daily Checklist:  Diet: Regular Diet  DVT PPx: Lovenox 40mg  q24h  Electrolytes: Replete Potassium to >/= 3.6 and Magnesium to >/= 1.8  Code Status: Full Code  Dispo: floor    Team Contact Information:   Primary Team: Internal Medicine (MEDU)  Primary Resident: Matthew Layman, MD  Resident's Pager: [phone] (Gen [facility] Intern - [facility])    Interval History:   NAEON. MRSA(+). ~280 ascitic fluid PMNs. Planning to consolidate LVP with diag para for needle holiday today; will obtain both tomorrow. Ordered chaplain to tend to emotional needs; patient expressed feeling overwhelmed with his uncertain prognosis/transplant timeline and his wife's breast cancer. 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 Provided PCV20 and Shingrix.    All other systems were reviewed and are negative except as noted in the HPI    Objective:   Temp:  [36.9 ??C (98.4 ??F)-37.3 ??C (99.1 ??F)] 37.3 ??C (99.1 ??F)  Heart Rate:  [82-84] 82  Resp:  [18] 18  BP: (124-134)/(61-69) 124/61  FiO2 (%):  [21 %] 21 %  SpO2:  [93 %-96 %] 96 %    Gen: WDWN male in NAD, answers questions appropriately  Eyes: sclera anicteric, EOMI  HENT: atraumatic, MMM, OP w/o erythema  or exudate   Heart: RRR, S1, S2, no M/R/G, no chest wall tenderness  Lungs: CTAB, no crackles or wheezes, no use of accessory muscles  Abdomen: Soft, moderate abdominal distention without distention, no rebound or guarding, minimal tenderness to palpation  Extremities: no clubbing, cyanosis, or edema in the BLEs  Neuro: Alert and oriented to person, Matthew [facility], January 2023, no asterixis on exam, no focal deficits    Labs/Studies: Labs and Studies from the last 24hrs per EMR and Reviewed

## 2021-08-29 DIAGNOSIS — Z0120 Encounter for dental examination and cleaning without abnormal findings: Principal | ICD-10-CM

## 2021-08-29 LAB — COMPREHENSIVE METABOLIC PANEL
ALBUMIN: 2.5 g/dL — ABNORMAL LOW (ref 3.4–5.0)
ALKALINE PHOSPHATASE: 95 U/L (ref 46–116)
ALT (SGPT): 20 U/L (ref 10–49)
ANION GAP: 9 mmol/L (ref 5–14)
AST (SGOT): 35 U/L — ABNORMAL HIGH (ref <=34)
BILIRUBIN TOTAL: 3 mg/dL — ABNORMAL HIGH (ref 0.3–1.2)
BLOOD UREA NITROGEN: <5 mg/dL — ABNORMAL LOW (ref 9–23)
CALCIUM: 8.3 mg/dL — ABNORMAL LOW (ref 8.7–10.4)
CHLORIDE: 103 mmol/L (ref 98–107)
CO2: 25 mmol/L (ref 20.0–31.0)
CREATININE: 0.85 mg/dL
EGFR CKD-EPI (2021) MALE: >90 mL/min/{1.73_m2} (ref >=60)
GLUCOSE RANDOM: 201 mg/dL — ABNORMAL HIGH (ref 70–179)
POTASSIUM: 3.6 mmol/L (ref 3.4–4.8)
PROTEIN TOTAL: 5.1 g/dL — ABNORMAL LOW (ref 5.7–8.2)
SODIUM: 137 mmol/L (ref 135–145)

## 2021-08-29 LAB — PROTIME-INR
INR: 2.41
PROTIME: 28.2 s — ABNORMAL HIGH (ref 9.8–12.8)

## 2021-08-29 MED ORDER — SPIRONOLACTONE 25 MG TABLET
ORAL_TABLET | Freq: Every day | ORAL | 0 refills | 30.00000 days
Start: 2021-08-29 — End: ?

## 2021-08-29 MED ORDER — RIFAXIMIN 550 MG TABLET
ORAL_TABLET | Freq: Two times a day (BID) | ORAL | 0 refills | 30.00000 days
Start: 2021-08-29 — End: ?

## 2021-08-29 MED ORDER — PANTOPRAZOLE 40 MG TABLET,DELAYED RELEASE
ORAL_TABLET | Freq: Every day | ORAL | 0 refills | 30.00000 days
Start: 2021-08-29 — End: ?

## 2021-08-29 MED ADMIN — melatonin tablet 3 mg: 3 mg | ORAL | @ 03:00:00

## 2021-08-29 MED ADMIN — insulin lispro (HumaLOG) injection 0-20 Units: 0-20 [IU] | SUBCUTANEOUS | @ 03:00:00

## 2021-08-29 MED ADMIN — lisinopriL (PRINIVIL,ZESTRIL) tablet 2.5 mg: 2.5 mg | ORAL | @ 13:00:00

## 2021-08-29 MED ADMIN — gabapentin (NEURONTIN) capsule 400 mg: 400 mg | ORAL | @ 03:00:00

## 2021-08-29 MED ADMIN — enoxaparin (LOVENOX) syringe 40 mg: 40 mg | SUBCUTANEOUS | @ 03:00:00

## 2021-08-29 MED ADMIN — rifAXIMin (XIFAXAN) tablet 550 mg: 550 mg | ORAL | @ 13:00:00 | Stop: 2021-09-01

## 2021-08-29 MED ADMIN — spironolactone (ALDACTONE) tablet 50 mg: 50 mg | ORAL | @ 13:00:00

## 2021-08-29 MED ADMIN — gabapentin (NEURONTIN) capsule 400 mg: 400 mg | ORAL | @ 13:00:00

## 2021-08-29 MED ADMIN — cyclobenzaprine (FLEXERIL) tablet 5 mg: 5 mg | ORAL | @ 03:00:00

## 2021-08-29 MED ADMIN — atorvastatin (LIPITOR) tablet 10 mg: 10 mg | ORAL | @ 03:00:00

## 2021-08-29 MED ADMIN — pantoprazole (PROTONIX) EC tablet 40 mg: 40 mg | ORAL | @ 13:00:00

## 2021-08-29 MED ADMIN — albumin human 25 % bottle 25 g: 25 g | INTRAVENOUS | @ 22:00:00 | Stop: 2021-08-29

## 2021-08-29 MED ADMIN — rifAXIMin (XIFAXAN) tablet 550 mg: 550 mg | ORAL | @ 03:00:00 | Stop: 2021-09-01

## 2021-08-29 MED ADMIN — insulin lispro (HumaLOG) injection 0-20 Units: 0-20 [IU] | SUBCUTANEOUS | @ 16:00:00

## 2021-08-29 MED ADMIN — cefTRIAXone (ROCEPHIN) 2 g in sodium chloride 0.9 % (NS) 100 mL IVPB-connector bag: 2 g | INTRAVENOUS | @ 09:00:00 | Stop: 2021-08-29

## 2021-08-29 MED ADMIN — insulin lispro (HumaLOG) injection 0-20 Units: 0-20 [IU] | SUBCUTANEOUS | @ 13:00:00

## 2021-08-29 MED ADMIN — cefTRIAXone (ROCEPHIN) 2 g in sodium chloride 0.9 % (NS) 100 mL IVPB-connector bag: 2 g | INTRAVENOUS | @ 20:00:00 | Stop: 2021-08-29

## 2021-08-29 MED ADMIN — furosemide (LASIX) tablet 40 mg: 40 mg | ORAL | @ 13:00:00

## 2021-08-29 NOTE — Unmapped (Signed)
Hepatology Consult Service   Progress Note         Assessment & Plan:   Matthew Key is a 61 y.o. 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 Pt is seen in consultation at the request of Corinna Capra, MD ([facility] ([facility])) for cirrhosis with SBP.    Continues to clinically improve with treatment of SBP. Renal function and bilirubin stable. His repeat diag para on 1/16 still show PMN count > 250 (was 282, so improving and close). Per ICID recs pt will need to complete full course of antibiotics before being reactivated. Today is last day of cefepime, and he is going to get LVP today, and fluid will be sent for cell count/analysis.    Has appt in dental clinic today.     Recommendations  - appreciate ICID recommendations  - appreciate dental's assistance. Patient has appt today  - OK for therapeutic para, but would limit to 3-4L and give albumin. Please send fluid for repeat cell count/analysis  - OK to restart home diuretics   - Continue to monitor I/Os, daily CMP, CBC, INR  - home diuretics restarted  - S/p IV Albumin infusions per SBP treatment   - continue home hydrocortisone  - cefepime to stop today, assuming repeat cell count/analysis has PMN count < 250  - He will need to resume ciprofloxacin for SBP prophylaxis upon discharge  - Will need to 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 We will continue to follow along with you. This patient was seen and examined with Dr. Sherryll Burger.     For questions, please contact the on-call fellow for the Hepatology Consult Service at [phone].     Interval History:   No acute events overnight.     Objective:   Temp: [35.7 ??C (96.3 ??F)-36.6 ??C (97.9 ??F)] 35.7 ??C (96.3 ??F)  Heart Rate:  [74] 74  Resp:  [18] 18  BP: (108-117)/(58-61) 117/61  FiO2 (%):  [21 %] 21 %  SpO2:  [91 %-98 %] 98 %    Gen: WDWN male in NAD, answers questions appropriately  Eyes: Sclera icteric, EOMI  Heart: RRR  Lungs: CTAB, no use of accessory muscles  Abdomen:  soft, NTND, no rebound/guarding  Extremities: No clubbing, cyanosis  Psych: Alert, appropriate mood and affect    Labs/Studies: Labs and Studies from the last 24hrs per EMR and Reviewed

## 2021-08-29 NOTE — Unmapped (Signed)
Hepatology Consult Service   Progress Note         Assessment & Plan:   Matthew Key is a 61 y.o. 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 Pt is seen in consultation at the request of Corinna Capra, MD ([facility] ([facility])) for cirrhosis with SBP.    Continues to clinically improve with treatment of SBP. Renal function is stable and T bili is downtrending. His repeat diag para on 1/16 still show PMN count > 250 (was 282, so improving and close). Per ICID recs pt will need to complete full course of antibiotics before being reactivated. Also recommend Dental consult to have several teeth pulled in setting of potential TP and poor dentition as a post TP infectious risk factor. Otherwise, reasonable to restart diuretics and can consider therapeutic para at this point.     Recommendations  - appreciate ICID recommendations  - Dental consult, per ICID recs   - OK for therapeutic para, but would limit to 3-4L and give albumin   - OK to restart home diuretics   - Continue to monitor I/Os, daily CMP, CBC, INR  - Continue holding home diuretics  - S/p IV Albumin infusions per SBP treatment   - continue home hydrocortisone  - Patient will become inactive on transplant list while treating SBP, but per ICID anticipate reactivation after completes treatment for SBP on 08/29/21  - He will need to resume ciprofloxacin for SBP prophylaxis upon discharge  - Will need to 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 We will continue to follow along with you. This patient was seen and examined with Dr. Sherryll Burger.     For questions, please contact the on-call fellow for the Hepatology Consult Service at [phone].     Interval History:   No acute events overnight.     Objective:   Temp:  [36.6 ??C (97.9 ??F)-37.3 ??C (99.1 ??F)] 36.8 ??C (98.3 ??F)  Heart Rate:  [73-82] 74  Resp:  [16-18] 16  BP: (119-124)/(54-64) 121/64  FiO2 (%):  [21 %] 21 %  SpO2:  [93 %-97 %] 96 %    Gen: WDWN male in NAD, answers questions appropriately  Eyes: Sclera icteric, EOMI  Heart: RRR  Lungs: CTAB, no use of accessory muscles  Abdomen:  soft, NTND, no rebound/guarding  Extremities: No clubbing, cyanosis  Psych: Alert, appropriate mood and affect    Labs/Studies: Labs and Studies from the last 24hrs per EMR and Reviewed

## 2021-08-29 NOTE — Unmapped (Signed)
[facility]   Inpatient/ED Consultation    Service Date: 08/29/2021  Admit Date: 08/25/2021  Consulting Service: Hospital Oral Medicine  Requesting Service: Med General Doristine Counter (MDU)  Patient Location: Inpatient/Observation  Requesting Attending MD/PA/NP: Roseanne Reno, ANP  Consulting Attending Physician: Dr. Druscilla Brownie    Reason for Consult:   Hospital Oral Medicine consulted for evaluation of dentition prior to possible liver transplant.     Assessment     ??? Bensyn Matthew Key is a 61 y.o. 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 Dentistry consulted for evaluation of dentition prior to possible liver transplant.    ??? Extra oral and intra oral exam reveal no acute signs of infection are present. However, multiple retained root tips present at risk for recurrent infection. Patient requires full mouth debridement and extraction of #4-13, 15, 19-21, 25, 28,29. [phone] to be re-evaluated upon completion of debridement. Upon discussion with primary team, pt can proceed forward in an in patient clinic setting. Plan to proceed in phases if required.      Plan/Recommendations     ?? Patient requires full mouth debridement and extraction of #4-13, 15, 19-21, 25, 28,29. [phone] to be re-evaluated upon completion of debridement. Upon discussion with primary team, pt can proceed forward in an in patient clinic setting.     ?? Thank you for consulting with Hospital Oral Medicine and for the opportunity to participate in this patient's treatment.  Should you have any questions or concerns, please contact the Hospital Oral Medicine Clinic at [phone] ext [phone].      Subjective     History of Present Illness:  Matthew Key is a 61 y.o. 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 Dentistry consulted for evaluation of dentition prior to possible liver transplant.    Patient denies oral/facial swelling, drainage, abscess.    Patient reports he went to the dentist last May in regards to an abscess on the UR, however, pt stated after receiving an antibiotic he did not return for extraction. Patient states he hopes to eventually establish care somewhere in the future to replace the teeth he knows need to be extracted. Patient states he is ready to start fresh with everything. Pt is tender upon palpation of maxillary anterior dentition.     Pain score: 5/10    Problem List:  Patient Active Problem List   Diagnosis   ??? Hyponatremia   ??? Leukocytosis   ??? Non-alcoholic micronodular cirrhosis of liver (CMS-HCC)   ??? Pleural effusion, right   ??? Primary insomnia   ??? Obstructive sleep apnea on CPAP   ??? Thrombocytopenia, unspecified (CMS-HCC)   ??? Type 2 diabetes mellitus with diabetic neuropathy, with long-term current use of insulin (CMS-HCC)   ??? Spontaneous bacterial peritonitis (CMS-HCC)   ??? Anemia   ??? Left upper extremity swelling   ??? Adrenal insufficiency (CMS-HCC)   ??? Acute deep vein thrombosis (DVT) of brachial vein of left upper extremity (CMS-HCC)   ??? Acute thrombosis of right basilic vein   ??? Hypofibrinogenemia (CMS-HCC)   ??? Abdominal ascites   ??? Pleural effusion associated with hepatic disorder   ??? Hypertension       Personal Medical  History:   Past Medical History:   Diagnosis Date   ??? Arthritis    ??? Cirrhosis (CMS-HCC)    ??? History of transfusion    ??? Hypertension     under control with meds and weight loss   ??? Liver disease    ??? Stroke (CMS-HCC)     mild stroke   ??? Type 2 diabetes mellitus with diabetic neuropathy, with long-term current use of insulin (CMS-HCC) 06/09/2014       Personal Surgical History:   Past Surgical History:   Procedure Laterality Date   ??? KNEE SURGERY     ??? PR CATH PLACE/CORON ANGIO, IMG SUPER/INTERP,R&L HRT CATH, L HRT VENTRIC N/A 12/19/2020    Procedure: CATH LEFT/RIGHT HEART CATHETERIZATION;  Surgeon: Rosana Hoes, MD;  Location: [facility] CATH;  Service: Cardiology   ??? PR UPPER GI ENDOSCOPY,BIOPSY N/A 10/12/2020    Procedure: UGI ENDOSCOPY; WITH BIOPSY, SINGLE OR MULTIPLE;  Surgeon: Marene Lenz, MD;  [facility]: [facility];  Service: Gastroenterology       Social History:   Social History     Socioeconomic History   ??? Marital status: Married     Spouse name: None   ??? Number of children: None   ??? Years of education: None   ??? Highest education level: None   Tobacco Use   ??? Smoking status: Never   ??? Smokeless tobacco: Never   Substance and Sexual Activity   ??? Alcohol use: Not Currently     Comment: light drinker. No drink in over a year .   ??? Drug use: Never   ??? Sexual activity: Not Currently     Partners: Female     Birth control/protection: Surgical, Bilateral Tubal Ligation     Comment: wife   Other Topics Concern   ??? Exercise Yes   ??? Living Situation Yes     Comment: Married     Social Determinants of Health     Financial Resource Strain: Low Risk    ??? Difficulty of Paying Living Expenses: Not hard at all   Food Insecurity: No Food Insecurity   ??? Worried About Programme researcher, broadcasting/film/video in the Last Year: Never true   ??? Ran Out of Food in the Last Year: Never true   Transportation Needs: No Transportation Needs   ??? Lack of Transportation (Medical): No   ??? Lack of Transportation (Non-Medical): No       Family History:   The patient's family history includes Alzheimer's disease in his father; Aortic dissection in his brother..    Allergies:  Venom-honey bee    Medications:   Current Facility-Administered Medications   Medication Dose Route Frequency Provider Last Rate Last Admin   ??? acetaminophen (TYLENOL) tablet 650 mg  650 mg Oral Q8H PRN Larinda Buttery, MD       ??? atorvastatin (LIPITOR) tablet 10 mg  10 mg Oral Nightly Larinda Buttery, MD   10 mg at 08/28/21 2212   ??? cefTRIAXone (ROCEPHIN) 2 g in sodium chloride 0.9 % (NS) 100 mL IVPB-connector bag  2 g Intravenous Once Mervin Kung, ANP       ??? cyclobenzaprine (FLEXERIL) tablet 5 mg  5 mg Oral TID PRN Roanna Raider, MD   5 mg at 08/28/21 2212   ??? dextrose (D10W) 10% bolus 125 mL  12.5 g Intravenous Q10 Min PRN Larinda Buttery, MD       ??? enoxaparin (LOVENOX) syringe 40  mg  40 mg Subcutaneous Q24H Larinda Buttery, MD   40 mg at 08/28/21 2214   ??? furosemide (LASIX) tablet 40 mg  40 mg Oral Daily Mardella Layman, MD   40 mg at 08/29/21 0806   ??? gabapentin (NEURONTIN) capsule 400 mg  400 mg Oral BID Larinda Buttery, MD   400 mg at 08/29/21 0731   ??? glucagon injection 1 mg  1 mg Intramuscular Once PRN Larinda Buttery, MD       ??? glucose chewable tablet 16 g  16 g Oral Q10 Min PRN Larinda Buttery, MD       ??? insulin lispro (HumaLOG) injection 0-20 Units  0-20 Units Subcutaneous ACHS Larinda Buttery, MD   3 Units at 08/29/21 1125   ??? lactulose oral solution (30 mL cup)  20 g Oral TID Larinda Buttery, MD   20 g at 08/28/21 1403   ??? lisinopriL (PRINIVIL,ZESTRIL) tablet 2.5 mg  2.5 mg Oral Daily Roanna Raider, MD   2.5 mg at 08/29/21 1610   ??? melatonin tablet 3 mg  3 mg Oral Nightly PRN Larinda Buttery, MD   3 mg at 08/28/21 2214   ??? ondansetron (ZOFRAN-ODT) disintegrating tablet 4 mg  4 mg Oral Q8H PRN Larinda Buttery, MD       ??? pantoprazole (PROTONIX) EC tablet 40 mg  40 mg Oral Daily Larinda Buttery, MD   40 mg at 08/29/21 0806   ??? polyethylene glycol (MIRALAX) packet 17 g  17 g Oral Daily Larinda Buttery, MD   17 g at 08/28/21 0858   ??? rifAXIMin (XIFAXAN) tablet 550 mg  550 mg Oral BID Larinda Buttery, MD   550 mg at 08/29/21 9604   ??? spironolactone (ALDACTONE) tablet 50 mg  50 mg Oral Daily Mervin Kung, ANP   50 mg at 08/29/21 5409      Medications Prior to Admission   Medication Sig Dispense Refill Last Dose   ??? atorvastatin (LIPITOR) 10 MG tablet Take 10 mg by mouth nightly.      ??? ciprofloxacin HCl (CIPRO) 500 MG tablet TAKE 1 TABLET BY MOUTH IN THE MORNING 30 tablet 0    ??? flash glucose sensor kit 1 each by Other route every fourteen (14) days. Insert 1 sensor SQ q14 days; give #2 (30 day supply) or #6 (90 day supply) per pt preference 2 each 5    ??? furosemide (LASIX) 20 MG tablet Take 2 tablets (40 mg total) by mouth daily. 60 tablet 0    ??? gabapentin (NEURONTIN) 400 MG capsule Take 400 mg by mouth two (2) times a day.      ??? hydrocortisone (CORTEF) 5 MG tablet Take 10 mg (2 tablets) in morning as needed on days you feel sick. If you develop severe lightheadedness or severe nausea/vomiting you should be seen in the ER. 90 tablet 1    ??? insulin degludec (TRESIBA FLEXTOUCH U-100) 100 unit/mL (3 mL) InPn Inject 0.2 mL (20 Units total) under the skin daily. Adjust as instructed. 6 mL 3    ??? insulin syringe-needle U-100 1 mL 31 gauge x 5/16 (8 mm) Syrg Use once as needed for Hydrocortisone injection.. 10 each 0    ??? lactulose (CHRONULAC) 10 gram/15 mL (15 mL) Soln Take 30 mL by mouth Two (2) times a day.       ??? lisinopriL (PRINIVIL,ZESTRIL) 2.5 MG tablet Take 2.5 mg by mouth daily.      ???  pantoprazole (PROTONIX) 40 MG tablet Take 1 tablet (40 mg total) by mouth daily. 30 tablet 0    ??? polyethylene glycol (MIRALAX) 17 gram packet Take 17 g by mouth daily. 30 packet 0    ??? rifAXIMin (XIFAXAN) 550 mg Tab Take 1 tablet (550 mg total) by mouth Two (2) times a day. 60 tablet 11    ??? semaglutide (OZEMPIC) 1 mg/dose (4 mg/3 mL) PnIj injection Inject 1 mg under the skin every seven (7) days. 3 mL 3    ??? spironolactone (ALDACTONE) 25 MG tablet Take 2 tablets (50 mg total) by mouth daily. 180 tablet 3    ??? syringe with needle 3 mL 23 x 1 Syrg Use as needed for Hydrocortisone injection 1 each 0        Medication list verified with medical record.    Objective     Vitals:  Vitals:    08/28/21 2354 08/29/21 0213 08/29/21 0716 08/29/21 0800   BP: 108/58   117/61   Pulse: 74   74   Resp: 18   18   Temp: 36.6 ??C (97.9 ??F)   35.7 ??C (96.3 ??F)   TempSrc: Oral   Axillary   SpO2: 95% 91% 97% 98%   Weight:       Height:            Physical Examination:    General:   AT . Well-developed, comfortable and in no apparent distress.     Neurological:   Alert and oriented to person, place, and time.     E/O:   No facial asymmetry, swelling, or lymphadenopathy. TMJ asymptomatic without clicks or crepitations. Airway visualized. Patient able to manage oral secretions.    I/O:   MMM. FOM soft and non-raised. Oral cavity without mass or lesions. Grossly carious/retained root tips of #4-13, 15, 19-21, 25, 28,29. Hopeless prognosis, extraction recommended. Carious #3,14,16 prognosis questionable due to location of caries.   No signs of acute infection, parulis, sinus tracts, edema, swelling or abscess. Generalized inflammation present on gingiva, increased erythema on maxillary anterior due to retained root tips present.       Pertinent Diagnostic Tests:  Recent Results (from the past 24 hour(s))   POCT Glucose    Collection Time: 08/28/21  5:55 PM   Result Value Ref Range    Glucose, POC 301 (H) 70 - 179 mg/dL   POCT Glucose    Collection Time: 08/28/21  5:58 PM   Result Value Ref Range    Glucose, POC 269 (H) 70 - 179 mg/dL   POCT Glucose    Collection Time: 08/28/21 10:17 PM   Result Value Ref Range    Glucose, POC 200 (H) 70 - 179 mg/dL   Comprehensive Metabolic Panel    Collection Time: 08/29/21  6:22 AM   Result Value Ref Range    Sodium 137 135 - 145 mmol/L    Potassium 3.6 3.4 - 4.8 mmol/L    Chloride 103 98 - 107 mmol/L    CO2 25.0 20.0 - 31.0 mmol/L    Anion Gap 9 5 - 14 mmol/L    BUN <5 (L) 9 - 23 mg/dL    Creatinine 7.25 3.66 - 1.10 mg/dL    eGFR CKD-EPI (4403) Male >90 >=60 mL/min/1.80m2    Glucose 201 (H) 70 - 179 mg/dL    Calcium 8.3 (L) 8.7 - 10.4 mg/dL    Albumin 2.5 (L) 3.4 - 5.0 g/dL    Total Protein 5.1 (  L) 5.7 - 8.2 g/dL    Total Bilirubin 3.0 (H) 0.3 - 1.2 mg/dL    AST 35 (H) <=16 U/L    ALT 20 10 - 49 U/L    Alkaline Phosphatase 95 46 - 116 U/L   PT-INR    Collection Time: 08/29/21  6:22 AM Result Value Ref Range    PT 28.2 (H) 9.8 - 12.8 sec    INR 2.41    POCT Glucose    Collection Time: 08/29/21  7:05 AM   Result Value Ref Range    Glucose, POC 210 (H) 70 - 179 mg/dL   POCT Glucose    Collection Time: 08/29/21 11:18 AM   Result Value Ref Range    Glucose, POC 291 (H) 70 - 179 mg/dL         Treatment     Radiographs exposed and read: Panoramic    - Panoramic radiograph: Condyles seated bilaterally. No abnormal sinus morphology. No gross bony pathology. Grossly carious/ retained root tips of #4-13, 15, 19-21, 25, 28,29. Hopeless prognosis, extraction recommended. Carious #3,14,16 prognosis is questionable.         Resident Provider: Virgina Organ                                 PGY-1 Resident                                  [phone]              Attending: Dr. Druscilla Brownie    Assistant: Dereck Leep

## 2021-08-29 NOTE — Unmapped (Signed)
Pt A&Ox4, VSS, denies pain, up to BR with stand-by assistance, blood glucose monitored, contact isolation maintained, in bed with call bell within reach, continuing with plan of care.    Problem: Adult Inpatient Plan of Care  Goal: Plan of Care Review  Outcome: Progressing  Flowsheets (Taken 08/28/2021 1826)  Progress: no change  Plan of Care Reviewed With: patient  Goal: Patient-Specific Goal (Individualized)  Outcome: Progressing  Flowsheets (Taken 08/28/2021 1826)  Patient-Specific Goals (Include Timeframe): Pt will be free from s/s of worsening infection this shift  Individualized Care Needs: Monitor labs/VS/BG, fall precautions, LVP  Anxieties, Fears or Concerns: None  Goal: Absence of Hospital-Acquired Illness or Injury  Outcome: Progressing  Goal: Optimal Comfort and Wellbeing  Outcome: Progressing  Goal: Readiness for Transition of Care  Outcome: Progressing  Goal: Rounds/Family Conference  Outcome: Progressing     Problem: Infection  Goal: Absence of Infection Signs and Symptoms  Outcome: Progressing  Intervention: Prevent or Manage Infection  Recent Flowsheet Documentation  Taken 08/28/2021 1000 by Camila Li, RN  Isolation Precautions: contact precautions maintained  Taken 08/28/2021 0800 by Camila Li, RN  Isolation Precautions: contact precautions maintained

## 2021-08-29 NOTE — Unmapped (Signed)
Note written for communication purposes and to complete consult order for the Medicine Procedure Service. Dx para performed on 08/27/2021 and LVP on 08/29/2021 without immediate complications. Pt to be discharged after LVP. Ok from our standpoint. Please see procedure note for details.    Thank you for allowing [place] to participate in this patient's care. Will sign off. If you need further assistance, the Medicine Procedure Service will be happy to help. Please page the Procedure Service at [phone].    Sinclair Ship, MD  August 29, 2021 3:17 PM

## 2021-08-29 NOTE — Unmapped (Signed)
Spiritual Care Contact  August 28, 2021 10:00 PM    Clinical Encounter Type  Type of Visit: Attempt (Patient unavailable)  Care Provided To: Patient not available  Referral Source: Nurse  On-Call Visit?: Yes  Reason for Visit: Routine spiritual support  Minutes Spent: 5 minutes     I attempted to make contact with Cainen D. Katrinka Blazing, but the patient was unavailable for a chaplain visit.     Follow up: Referral made to unit chaplain for follow up on 1/18. For emergent needs, please page the on call chaplain.    Fernando Stoiber C. [facility]  ED/Critical Care Night Chaplain  Pager: [phone] On-Call Pager [phone]  Office: [phone]  [email or web address]           Signed: Del Wiseman Hyman Hopes, Chaplain 6:14 AM 08/29/2021

## 2021-08-29 NOTE — Unmapped (Signed)
Large Volume Paracentesis Procedure Note (CPT [phone])    Pre-procedural Planning     Patient Name:: Matthew Key  Patient MRN: [id number]    Indications:  Diuretic refractory ascites    Known Bleeding Diathesis: Patient/caregiver denies any known bleeding or platelet disorder.     Antiplatelet Agents: This patient is not on an antiplatelet agent.    Systemic Anticoagulation: This patient is not on full systemic anticoagulation.    Significant Labs:  INR   Date Value Ref Range Status   08/29/2021 2.41  Final   08/20/2021 1.3 (H) 0.9 - 1.2 Final     Comment:     Reference interval is for non-anticoagulated patients.  Suggested INR therapeutic range for Vitamin K  antagonist therapy:     Standard Dose (moderate intensity                    therapeutic range):       2.0 - 3.0     Higher intensity therapeutic range       2.5 - 3.5       PT   Date Value Ref Range Status   08/29/2021 28.2 (H) 9.8 - 12.8 sec Final     APTT   Date Value Ref Range Status   08/25/2021 39.2 (H) 25.1 - 36.5 sec Final     Platelet   Date Value Ref Range Status   08/28/2021 58 (L) 150 - 450 10*9/L Final   07/09/2021 57 (LL) 150 - 450 x10E3/uL Final       Consent: Informed consent was obtained after explanation of the risks (including bleeding, infection, bowel perforation, and leaking) and benefits of the procedure. Refer to the consent documentation.    Procedure Details     Time-out was performed immediately prior to the procedure.    The head of the bed was placed 45 degrees above level and a large area of ascitic fluid was identified using ultrasound in the left lower quadrant.  The linear probe with color doppler was used to ensure that a vessel was not located in the proposed needle path.            Skin was cleaned with Chlorhexidine. Local anesthesia with 1 percent lidocaine was introduced subcutaneously then deep to the skin until the parietal peritoneum was anesthetized. A small incision was made at the skin site.  A Yueh needle with a catheter was introduced into this site using the subcutaneous tunneling technique until ascitic fluid was encountered and the catheter was introduced over the needle.  Ascitic fluid and the catheter were removed with minimal bleeding. Skin glue was applied after holding pressure and achieving hemostasis.    Findings     2.5 liters of clear and yellow ascites fluid was obtained.    The ascites fluid was sent for Cell count and diff.    Condition     The patient tolerated the procedure well and remains in the same condition as pre-procedure.    Complications     None; patient tolerated the procedure well.      Requesting Service: Med Candis Schatz (MDU)    Time Requested: 08:00  Time Completed: 13:45    Resident(s) Performing Procedure: Vernie Ammons, PGY1 and Maeola Sarah PGY3    ________________________________________________________________  Teaching Physician Attestation  I was present for the entirety of the procedure.     Sinclair Ship  Professor of Internal Medicine and Pediatrics  August 29, 2021 1:43 PM

## 2021-08-29 NOTE — Unmapped (Signed)
[facility]   Inpatient/ED Consultation    * See formal consultation note on 1/18.    Treatment     Radiographs exposed and read: Panoramic             - Panoramic radiograph: Condyles seated bilaterally. No abnormal sinus morphology. No gross bony pathology. Grossly carious/ retained root tips of #4-13, 15, 19-21, 25, 28,29. Hopeless prognosis, extraction recommended. Carious #3,14,16 prognosis is questionable.         Resident Provider: Virgina Organ                                 PGY-1 Resident                                  [phone]              Attending: Druscilla Brownie, DDS

## 2021-08-29 NOTE — Unmapped (Signed)
Pt admitted with SBP  Pt continues on rocephin with no new s/s of infection. VSS. Alert orientedx4, Some complaints of back spasms due to the hospital bed, flexeril given with good relief again this shift. ACHS monitored, 1 unit SSI required this shift. Pt sleeping since 10pm on CPAP until 4am . Pt stated 4 bms since mn, he stated he took too much lactulose yesterday   Potential LVP today. Pt voiced understanding of POC.  Problem: Adult Inpatient Plan of Care  Goal: Plan of Care Review  Outcome: Progressing  Goal: Patient-Specific Goal (Individualized)  Outcome: Progressing  Flowsheets (Taken 08/29/2021 0415)  Patient-Specific Goals (Include Timeframe): Patient will be free from s/s of worsening infection this shift  Individualized Care Needs: monitor vs/labs, ACHS, ? LVP, dental consult, iv antibx  Anxieties, Fears or Concerns: none  Goal: Absence of Hospital-Acquired Illness or Injury  Outcome: Progressing  Intervention: Identify and Manage Fall Risk  Recent Flowsheet Documentation  Taken 08/28/2021 2000 by Valeta Harms, RN  Safety Interventions:   environmental modification   lighting adjusted for tasks/safety   isolation precautions   nonskid shoes/slippers when out of bed  Goal: Optimal Comfort and Wellbeing  Outcome: Progressing  Goal: Readiness for Transition of Care  Outcome: Progressing  Goal: Rounds/Family Conference  Outcome: Progressing     Problem: Infection  Goal: Absence of Infection Signs and Symptoms  Outcome: Progressing  Intervention: Prevent or Manage Infection  Recent Flowsheet Documentation  Taken 08/28/2021 2000 by Valeta Harms, RN  Isolation Precautions: contact precautions maintained

## 2021-08-30 DIAGNOSIS — K029 Dental caries, unspecified: Principal | ICD-10-CM

## 2021-08-30 LAB — CBC
HEMATOCRIT: 31.8 % — ABNORMAL LOW (ref 39.0–48.0)
HEMATOCRIT: 31.7 % — ABNORMAL LOW (ref 39.0–48.0)
HEMOGLOBIN: 10.9 g/dL — ABNORMAL LOW (ref 12.9–16.5)
HEMOGLOBIN: 10.9 g/dL — ABNORMAL LOW (ref 12.9–16.5)
MEAN CORPUSCULAR HEMOGLOBIN CONC: 34.3 g/dL (ref 32.0–36.0)
MEAN CORPUSCULAR HEMOGLOBIN CONC: 34.4 g/dL (ref 32.0–36.0)
MEAN CORPUSCULAR HEMOGLOBIN: 34.7 pg — ABNORMAL HIGH (ref 25.9–32.4)
MEAN CORPUSCULAR HEMOGLOBIN: 34.3 pg — ABNORMAL HIGH (ref 25.9–32.4)
MEAN CORPUSCULAR VOLUME: 101 fL — ABNORMAL HIGH (ref 77.6–95.7)
MEAN CORPUSCULAR VOLUME: 99.6 fL — ABNORMAL HIGH (ref 77.6–95.7)
MEAN PLATELET VOLUME: 10 fL (ref 6.8–10.7)
MEAN PLATELET VOLUME: 10.5 fL (ref 6.8–10.7)
PLATELET COUNT: 50 10*9/L — ABNORMAL LOW (ref 150–450)
PLATELET COUNT: 73 10*9/L — ABNORMAL LOW (ref 150–450)
RED BLOOD CELL COUNT: 3.15 10*12/L — ABNORMAL LOW (ref 4.26–5.60)
RED BLOOD CELL COUNT: 3.18 10*12/L — ABNORMAL LOW (ref 4.26–5.60)
RED CELL DISTRIBUTION WIDTH: 15.5 % — ABNORMAL HIGH (ref 12.2–15.2)
RED CELL DISTRIBUTION WIDTH: 15.6 % — ABNORMAL HIGH (ref 12.2–15.2)
WBC ADJUSTED: 4.8 10*9/L (ref 3.6–11.2)
WBC ADJUSTED: 5.8 10*9/L (ref 3.6–11.2)

## 2021-08-30 LAB — PROTIME-INR
INR: 2.41
PROTIME: 28.3 s — ABNORMAL HIGH (ref 9.8–12.8)

## 2021-08-30 LAB — COMPREHENSIVE METABOLIC PANEL
ALBUMIN: 2.8 g/dL — ABNORMAL LOW (ref 3.4–5.0)
ALKALINE PHOSPHATASE: 135 U/L — ABNORMAL HIGH (ref 46–116)
ALT (SGPT): 15 U/L (ref 10–49)
ANION GAP: 6 mmol/L (ref 5–14)
AST (SGOT): 30 U/L (ref <=34)
BILIRUBIN TOTAL: 3.4 mg/dL — ABNORMAL HIGH (ref 0.3–1.2)
BLOOD UREA NITROGEN: 7 mg/dL — ABNORMAL LOW (ref 9–23)
BUN / CREAT RATIO: 7
CALCIUM: 8 mg/dL — ABNORMAL LOW (ref 8.7–10.4)
CHLORIDE: 101 mmol/L (ref 98–107)
CO2: 28 mmol/L (ref 20.0–31.0)
CREATININE: 0.96 mg/dL
EGFR CKD-EPI (2021) MALE: 90 mL/min/{1.73_m2} (ref >=60)
GLUCOSE RANDOM: 192 mg/dL — ABNORMAL HIGH (ref 70–179)
POTASSIUM: 3.6 mmol/L (ref 3.4–4.8)
PROTEIN TOTAL: 5.3 g/dL — ABNORMAL LOW (ref 5.7–8.2)
SODIUM: 135 mmol/L (ref 135–145)

## 2021-08-30 MED ORDER — PANTOPRAZOLE 40 MG TABLET,DELAYED RELEASE
ORAL_TABLET | Freq: Every day | ORAL | 0 refills | 30.00000 days
Start: 2021-08-30 — End: ?

## 2021-08-30 MED ORDER — RIFAXIMIN 550 MG TABLET
ORAL_TABLET | Freq: Two times a day (BID) | ORAL | 0 refills | 30.00000 days
Start: 2021-08-30 — End: ?

## 2021-08-30 MED ORDER — SPIRONOLACTONE 25 MG TABLET
ORAL_TABLET | Freq: Every day | ORAL | 0 refills | 30.00000 days
Start: 2021-08-30 — End: ?

## 2021-08-30 MED ADMIN — lactulose oral solution (30 mL cup): 20 g | ORAL | @ 15:00:00

## 2021-08-30 MED ADMIN — furosemide (LASIX) tablet 40 mg: 40 mg | ORAL | @ 15:00:00

## 2021-08-30 MED ADMIN — gabapentin (NEURONTIN) capsule 400 mg: 400 mg | ORAL | @ 02:00:00

## 2021-08-30 MED ADMIN — rifAXIMin (XIFAXAN) tablet 550 mg: 550 mg | ORAL | @ 02:00:00 | Stop: 2021-09-01

## 2021-08-30 MED ADMIN — polyethylene glycol (MIRALAX) packet 17 g: 17 g | ORAL | @ 15:00:00

## 2021-08-30 MED ADMIN — pantoprazole (PROTONIX) EC tablet 40 mg: 40 mg | ORAL | @ 15:00:00

## 2021-08-30 MED ADMIN — insulin lispro (HumaLOG) injection 0-20 Units: 0-20 [IU] | SUBCUTANEOUS | @ 22:00:00

## 2021-08-30 MED ADMIN — gabapentin (NEURONTIN) capsule 400 mg: 400 mg | ORAL | @ 15:00:00

## 2021-08-30 MED ADMIN — spironolactone (ALDACTONE) tablet 50 mg: 50 mg | ORAL | @ 15:00:00

## 2021-08-30 MED ADMIN — insulin NPH (HumuLIN,NovoLIN) injection 5 Units: 5 [IU] | SUBCUTANEOUS | @ 18:00:00 | Stop: 2021-08-30

## 2021-08-30 MED ADMIN — atorvastatin (LIPITOR) tablet 10 mg: 10 mg | ORAL | @ 02:00:00

## 2021-08-30 MED ADMIN — rifAXIMin (XIFAXAN) tablet 550 mg: 550 mg | ORAL | @ 15:00:00 | Stop: 2021-09-01

## 2021-08-30 MED ADMIN — sodium chloride (NS) 0.9 % infusion: INTRAVENOUS | @ 15:00:00

## 2021-08-30 MED ADMIN — ciprofloxacin HCl (CIPRO) tablet 500 mg: 500 mg | ORAL | @ 15:00:00 | Stop: 2021-12-07

## 2021-08-30 MED ADMIN — lisinopriL (PRINIVIL,ZESTRIL) tablet 2.5 mg: 2.5 mg | ORAL | @ 15:00:00

## 2021-08-30 MED ADMIN — acetaminophen (TYLENOL) tablet 650 mg: 650 mg | ORAL

## 2021-08-30 MED ADMIN — insulin lispro (HumaLOG) injection 0-20 Units: 0-20 [IU] | SUBCUTANEOUS | @ 03:00:00

## 2021-08-30 NOTE — Unmapped (Signed)
Alert and oriented,vitals stable.No c/o pain.Refused Lactulose this shift,said he had more than 4 BMs already and does not want to be awake all night using the bathroom because he has dental extraction in am.Blood sugar=381,given 4 units insulin per sliding scale.Currently resting in bed,wearing his CPAP.Will cont.to monitor.    Problem: Adult Inpatient Plan of Care  Goal: Plan of Care Review  Outcome: Progressing  Goal: Patient-Specific Goal (Individualized)  Outcome: Progressing  Flowsheets (Taken 08/30/2021 0320)  Patient-Specific Goals (Include Timeframe): Pt.will remain free from s/s worsening infection this shift.  Individualized Care Needs: Monitor vitals/labs.,blood sugar checks,CPAp at night,falls prec.free from s/s  Anxieties, Fears or Concerns: None voiced  Goal: Absence of Hospital-Acquired Illness or Injury  Outcome: Progressing  Intervention: Identify and Manage Fall Risk  Recent Flowsheet Documentation  Taken 08/29/2021 2000 by Debara Pickett, RN  Safety Interventions:  ??? isolation precautions  ??? fall reduction program maintained  ??? environmental modification  ??? lighting adjusted for tasks/safety  ??? nonskid shoes/slippers when out of bed  Goal: Optimal Comfort and Wellbeing  Outcome: Progressing  Goal: Readiness for Transition of Care  Outcome: Progressing  Goal: Rounds/Family Conference  Outcome: Progressing     Problem: Infection  Goal: Absence of Infection Signs and Symptoms  Outcome: Progressing  Intervention: Prevent or Manage Infection  Recent Flowsheet Documentation  Taken 08/29/2021 2000 by Debara Pickett, RN  Isolation Precautions: contact precautions maintained

## 2021-08-30 NOTE — Unmapped (Signed)
Internal Medicine (MEDU) Progress Note    Assessment & Plan:   Matthew Key is a 61 y.o. 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 ED with acute onset abdominal pain and fever, found to have SBP, now s/p treatment.      Principal Problem:    Spontaneous bacterial peritonitis (CMS-HCC)  Active Problems:    Non-alcoholic micronodular cirrhosis of liver (CMS-HCC)    Obstructive sleep apnea on CPAP    Thrombocytopenia, unspecified (CMS-HCC)    Anemia    Adrenal insufficiency (CMS-HCC)  Resolved Problems:    * No resolved hospital problems. *    Acute on Chronic Liver Failure 2/2 SBP - NASH Cirrhosis: Admitted w/ abd pain and fever s/p LVP on 1/11 iso unintentional cessation of prophylactic ciprofloxacin; initial tap consistent with SBP with improving PMNs on subsequent taps. Resumed active status on transplant listing.   - S/p CTX x5d, continue cipro ppx  - hepatology following, appreciate reccs  - diuretics resumed 1/17: lasix 40mg /spiro 50mg   - extraction with dentistry today to optimize before transplant   - CBC/T+S this AM, goal platelets per their request >50, closer to 75    Type 2 DM: Follows with [facility]. A1c 5.8% (08/2021). On Ozempic and Tresiba 40U daily. Pt req 1u over 24h despite good PO intake, will continue to monitor on SSI  - Give NPH 5u now and restart lantus (in place of tresiba) 10u qHS + SSI  - Hold home ozempic  - Atorvastatin 10mg  daily     HTN: Normotensive on admission. Initially held antihypertensives on admission with active infection, but given stability, resumed 1/17.   - restart home Lisinopril 2.5mg  daily   - Continue Lasix/spiro as above  ??  ?CVA vs. TIA: Carried forward in patient chart since at least 2018, though no evidence of this on CT head 06/2021. Possible TIA.   - Atorva 10mg  daily   - No indication to start ASA given uncertainty of diagnosis and bleeding risk with cirrhosis/thrombocytopenia     Secondary Adrenal Insufficiency  Follows w/ [place] [facility], thought to be 2/2 chronic steroid exposure from multiple spinal injections. Previously on hydrocortisone daily though most recent cosyntropin stim test improved from 8.4->22.9. Endocrine consulted, no need for steroids this hospitalization given robust response to stim test and adequate treatment of infection.    Daily Checklist:  Diet: Regular Diet  DVT PPx: Lovenox 40mg  q24h  Electrolytes: Replete Potassium to >/= 3.6 and Magnesium to >/= 1.8  Code Status: Full Code  Dispo: floor    Team Contact Information:   Primary Team: Internal Medicine (MEDU)  Primary Resident: Kandis Fantasia, MD  Resident's Pager: [phone] [facility])    Interval History:   NAEON. Wore his CPAP overnight without issue. BG mildly elevated, received his SSI without issue.     He is optimistic about transplant re-listing. He wants to get his teeth extracted today    All other systems were reviewed and are negative except as noted in the HPI    Objective:   Temp:  [35.7 ??C (96.3 ??F)-37.1 ??C (98.7 ??F)] 37.1 ??C (98.7 ??F)  Heart Rate:  [68-77] 77  Resp:  [18] 18  BP: (98-117)/(55-61) 98/55  FiO2 (%):  [21 %] 21 %  SpO2:  [97 %-98 %] 97 %    Gen: male in NAD, answers questions appropriately  Eyes: sclera mildly icteric, EOMI, PERRL  HENT: atraumatic, MMM  Heart: RRR, systolic murmur present  Lungs: normal WOB on RA, lungs CTA b/l  Abdomen: moderate abdominal distension, improved, soft and compressible  Extremities: WWP  Neuro: Alert and oriented x3    Labs/Studies: Labs and Studies from the last 24hrs per EMR and Reviewed

## 2021-08-30 NOTE — Unmapped (Signed)
Patient compliant with CPAP tonight without incident.

## 2021-08-30 NOTE — Unmapped (Signed)
Internal Medicine (MEDU) Progress Note    Assessment & Plan:   Matthew Key is a 61 y.o. 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      Principal Problem:    Spontaneous bacterial peritonitis (CMS-HCC)  Active Problems:    Non-alcoholic micronodular cirrhosis of liver (CMS-HCC)    Obstructive sleep apnea on CPAP    Thrombocytopenia, unspecified (CMS-HCC)    Anemia    Adrenal insufficiency (CMS-HCC)  Resolved Problems:    * No resolved hospital problems. *    Acute on Chronic Liver Failure 2/2 SBP - NASH Cirrhosis: Admitted w/ abd pain and fever s/p LVP on 1/11 iso unintentional cessation of prophylactic ciprofloxacin; initial tap consistent with SBP with improving PMNs on subsequent taps. Transplant candidacy suspended while being treated for SBP, now improving.    - trend ascites labs on diag para   - pt inactive on transplant list until peritoneal PMN count < 250  - Finish CTX x5d today, resume ppx cipro to follow  - diuretics resumed 1/17: lasix 40mg /spiro 50mg     Type 2 DM: Follows with [facility]. A1c 5.8% (08/2021). On Ozempic and Triseba 40U daily. Pt req 1u over 24h despite good PO intake, will continue to monitor on SSI  - SSI   - Hold home ozempic  - Atorvastatin 10mg  daily     HTN: Normotensive on admission. Initially held antihypertensives on admission with active infection, but given stability, resumed 1/17.   - restart home Lisinopril 2.5mg  daily   - Hold Lasix/spiro  ??  ?CVA vs. TIA: Carried forward in patient chart since at least 2018, though no evidence of this on CT head 06/2021. Possible TIA.   - Atorva 10mg  daily   - No indication to start ASA given uncertainty of diagnosis and bleeding risk with cirrhosis/thrombocytopenia     Secondary Adrenal Insufficiency  Follows w/ [place] [facility], thought to be 2/2 chronic steroid exposure from multiple spinal injections. Previously on hydrocortisone daily though most recent cosyntropin stim test improved from 8.4->22.9. Endocrine consulted, no need for steroids this hospitalization given robust response to stim test and adequate treatment of infection.    Daily Checklist:  Diet: Regular Diet  DVT PPx: Lovenox 40mg  q24h  Electrolytes: Replete Potassium to >/= 3.6 and Magnesium to >/= 1.8  Code Status: Full Code  Dispo: floor    Team Contact Information:   Primary Team: Internal Medicine (MEDU)  Primary Resident: Kandis Fantasia, MD  Resident's Pager: [phone] [facility])    Interval History:   NAEON. Doing well this afternoon.   Evaluated after dentistry examination. He does not want to leave the hospital until all of his liver transplant concerns are resolved. He would like to have teeth extracted here, if possible, since he lives 70 minutes away.     All other systems were reviewed and are negative except as noted in the HPI    Objective:   Temp:  [35.7 ??C (96.3 ??F)-36.9 ??C (98.4 ??F)] 36.9 ??C (98.4 ??F)  Heart Rate:  [68-74] 68  Resp:  [18] 18  BP: (108-117)/(56-61) 109/56  FiO2 (%):  [21 %] 21 %  SpO2:  [91 %-98 %] 97 %    Gen: male in NAD, answers questions appropriately  Eyes: sclera mildly icteric, EOMI  HENT: atraumatic, MMM  Heart: Reg rate  Lungs: normal WOB on RA  Abdomen: moderate abdominal distension  Extremities: WWP  Neuro: Alert and oriented x3    Labs/Studies: Labs and Studies from the last 24hrs per EMR and Reviewed

## 2021-08-30 NOTE — Unmapped (Signed)
MULTIDISCIPLINARY HEPATOBILIARY CONFERENCE DISPOSITION NOTE    08/29/21    DISCUSSION:   61 y/o M with NAFLD listed for transplant currently on hold for SBP who got CT A/P showing possible enlarging pancreatic cyst without ductal dilatation    FINDINGS:  Minimal growth of pancreatic cyst    PLAN:  - Follow up MRI in 6 months    Tawni Carnes, MD, MPH  Assistant Professor of Medicine  [facility]  Division of Gastroenterology and Hepatology

## 2021-08-30 NOTE — Unmapped (Signed)
Patient alert and oriented x 4 today.  Vitals stable.  Denies pain.  Patient had LVP and site was reinforced with gauze for a Greysen Swanton leakage.  Albumin given.  Patient went for his dental exam today and will need some teeth pulled either in hospital or outpatient.  Patient endorsed more than 6 BM's since midnight so lactulose held my shift.  Patient has been up independently to bathroom.  Pleasant and cooperative with plan of care.  Possible discharge tomorrow.  No other concerns.  Will monitor.       Problem: Adult Inpatient Plan of Care  Goal: Plan of Care Review  Outcome: Progressing  Goal: Patient-Specific Goal (Individualized)  Outcome: Progressing  Flowsheets (Taken 08/29/2021 1838)  Patient-Specific Goals (Include Timeframe): Patient will be free from s/s of worsening infection this shift  Individualized Care Needs: monitor vs/labs, ACHS, LVp, IV abx, lactulose  Anxieties, Fears or Concerns: none voiced  Goal: Absence of Hospital-Acquired Illness or Injury  Outcome: Progressing  Intervention: Identify and Manage Fall Risk  Recent Flowsheet Documentation  Taken 08/29/2021 0800 by Clarisse Gouge, RN  Safety Interventions:   environmental modification   fall reduction program maintained   low bed   nonskid shoes/slippers when out of bed  Intervention: Prevent Skin Injury  Recent Flowsheet Documentation  Taken 08/29/2021 0800 by Grayland Ormond Marwa Fuhrman, RN  Skin Protection: adhesive use limited  Intervention: Prevent and Manage VTE (Venous Thromboembolism) Risk  Recent Flowsheet Documentation  Taken 08/29/2021 0900 by Clarisse Gouge, RN  VTE Prevention/Management: anticoagulant therapy  Goal: Optimal Comfort and Wellbeing  Outcome: Progressing  Intervention: Provide Person-Centered Care  Recent Flowsheet Documentation  Taken 08/29/2021 0900 by Clarisse Gouge, RN  Trust Relationship/Rapport:   care explained   questions encouraged   reassurance provided  Goal: Readiness for Transition of Care  Outcome: Progressing  Goal: Rounds/Family Conference  Outcome: Progressing

## 2021-08-30 NOTE — Unmapped (Signed)
[facility] Oral Medicine  Post-op Instructions for Oral Surgery      To help aid the healing process please follow these instructions carefully:    If you develop the following symptoms, call the [facility] at [phone] or Hospital Operator at [phone] or proceed to the nearest Emergency Department.  Fever greater than 101.5  Shaking chills  Spreading redness  Trouble swallowing  Inability to open the lower jaw  Difficulty breathing  Uncontrolled pain  Wound separation  Excessive swelling  Uncontrolled bleeding   Other concerns    Home care following Oral Surgery (e.g. extractions, alveoloplasty) is important and recovery may be delayed if this is neglected. Some swelling, stiffness, pain and discomfort is to be expected after surgery. The discomfort is usually the worst for the first three days after the procedure and will gradually decrease over time.  If this is greater than expected, please call or return for care.    Oral Would care  Keep gauze pack in place for 30 min. with constant, firm pressure. Do not chew on the gauze.  Replace gauze pack every 30 minutes until little to no bleeding is observed.    A minor oozing of blood and discoloration of saliva is to be expected.  Until the numbness wears off, be careful not to bite your tongue, cheek, or lips.    Keep head elevated and rest quietly.  If instructed or needed, put ice or a cold pack on your cheek for 10 to 20 minutes at a time. Try to do this every 1 to 2 hours for the next 3 days (when you are awake) or until the swelling goes down. Put a thin cloth between the ice and your skin.  Please keep your mouth clean to aid in healing and prevent infection.  Brush your teeth three times daily after meals. Avoid brushing directly on the surgical site(s).    Rinse gently with warm salt water 24 hours after surgery to keep surgical site clean. To a glass of warm water, add a teaspoon of table salt.  Do not use over the counter mouthwash for two weeks.  Avoid aggressive rinsing or spitting.   Stitches placed will dissolve with time.  But sometimes, non-resorbable stitch is used, and you will be instructed to return for removal.    A blood clot will form in the tooth socket after the extraction and protects the bone during healing. If that blood clot gets loose or comes out of the socket, you may have a dry socket, which exposes the bone. A dry socket may last for several days and can cause severe pain. If you get a dry socket, call the clinic during regular hours.      Medications  If medications were prescribed, take all prescribed medications as directed.   If you were prescribed antibiotics, take it as directed until your prescription is complete.  While taking the antibiotic, if you develop an upset stomach or diarrhea for greater than two days, call [facility] or Hospital operator to speak with the resident on call.    Resume home medications.  IMPORTANT!  General anesthesia, sedation, and similar medications: If you have been given a general anesthetic, or a sedative, or drugs such as valium or narcotics (e.g. Ativan, Norco, Percocet, OxyContin, etc), it is essential that you do not drive a car or engage in any type of activity requiring normal reflex reactions such as working with power  tools, going up and down stairs, working around open flames or a hotplate. Be careful of dizziness - move slowly. Sudden position changes can cause nausea. Do not make important decisions. Do not drink alcoholic beverages - combinations of depressant drugs can be fatal. These precautions also apply to prescription pain medications.  Make sure you know how this medicine affects you before you drive, use machines, or other jobs that could be dangerous if you are not alert and clear-headed.     Avoid  Until bleeding stops, avoid: eating, sucking on the wound, frequent spitting, using mouth washes, or exercising. You may drink liquids, as desired, but not through a straw.  Do not use tobacco products while your surgical site(s) are healing.  You may call [phone] to speak with a trained quit coach who can provide help over the phone.  Avoid using straws for the first few days and rubbing the area with your tongue.  These actions can loosen the blood clot and delay healing.  Avoid chewing in the surgical area until your mouth heals. Soft foods like gelatin or soup might be easier to eat and may help you heal.    Diet  Please maintain a healthy soft food diet after surgery.  Do not eat anything that requires heavy chewing.  Examples of soft foods include grits, mashed potatoes, scrambled eggs, pasta, ground hamburger, overcooked vegetable.   Gradually add solid foods to your diet as you heal. You can eat solid foods again in about a week.  Drink plenty of fluids but do not use a straw.     Activity  Avoid any strenuous physical activities or contact sports where your jaws may be bumped or injured.  Do not lift anything over 25 pounds.  You may return to work in the next 1-3 days as you feel able.    If you have any questions, please call the [facility] at [phone] or the main hospital operator at [phone] and ask for the Dental Resident on-call.

## 2021-08-31 LAB — COMPREHENSIVE METABOLIC PANEL
ALBUMIN: 2.7 g/dL — ABNORMAL LOW (ref 3.4–5.0)
ALKALINE PHOSPHATASE: 96 U/L (ref 46–116)
ALT (SGPT): 15 U/L (ref 10–49)
ANION GAP: 7 mmol/L (ref 5–14)
AST (SGOT): 28 U/L (ref <=34)
BILIRUBIN TOTAL: 4.4 mg/dL — ABNORMAL HIGH (ref 0.3–1.2)
BLOOD UREA NITROGEN: 8 mg/dL — ABNORMAL LOW (ref 9–23)
BUN / CREAT RATIO: 9
CALCIUM: 8.1 mg/dL — ABNORMAL LOW (ref 8.7–10.4)
CHLORIDE: 103 mmol/L (ref 98–107)
CO2: 29 mmol/L (ref 20.0–31.0)
CREATININE: 0.92 mg/dL
EGFR CKD-EPI (2021) MALE: >90 mL/min/{1.73_m2} (ref >=60)
GLUCOSE RANDOM: 94 mg/dL (ref 70–179)
POTASSIUM: 3.6 mmol/L (ref 3.4–4.8)
PROTEIN TOTAL: 5.1 g/dL — ABNORMAL LOW (ref 5.7–8.2)
SODIUM: 139 mmol/L (ref 135–145)

## 2021-08-31 LAB — PROTIME-INR
INR: 2.37
PROTIME: 27.8 s — ABNORMAL HIGH (ref 9.8–12.8)

## 2021-08-31 MED ORDER — OXYCODONE 5 MG TABLET
ORAL_TABLET | Freq: Four times a day (QID) | ORAL | 0 refills | 3.00000 days | Status: CP | PRN
Start: 2021-08-31 — End: 2021-09-05
  Filled 2021-08-31: qty 10, 3d supply, fill #0

## 2021-08-31 MED ORDER — RIFAXIMIN 550 MG TABLET
ORAL_TABLET | Freq: Two times a day (BID) | ORAL | 0 refills | 30.00000 days | Status: CP
Start: 2021-08-31 — End: ?
  Filled 2021-08-31: qty 60, 30d supply, fill #0

## 2021-08-31 MED ORDER — SPIRONOLACTONE 25 MG TABLET
ORAL_TABLET | Freq: Every day | ORAL | 0 refills | 30.00000 days | Status: CP
Start: 2021-08-31 — End: ?

## 2021-08-31 MED ORDER — PANTOPRAZOLE 40 MG TABLET,DELAYED RELEASE
ORAL_TABLET | Freq: Every day | ORAL | 0 refills | 30.00000 days | Status: CP
Start: 2021-08-31 — End: ?
  Filled 2021-08-31: qty 30, 30d supply, fill #0

## 2021-08-31 MED ADMIN — insulin lispro (HumaLOG) injection 0-20 Units: 0-20 [IU] | SUBCUTANEOUS | @ 02:00:00

## 2021-08-31 MED ADMIN — lisinopriL (PRINIVIL,ZESTRIL) tablet 2.5 mg: 2.5 mg | ORAL | @ 13:00:00 | Stop: 2021-08-31

## 2021-08-31 MED ADMIN — lactulose oral solution (30 mL cup): 20 g | ORAL | @ 02:00:00

## 2021-08-31 MED ADMIN — lactulose oral solution (30 mL cup): 20 g | ORAL | @ 13:00:00 | Stop: 2021-08-31

## 2021-08-31 MED ADMIN — gabapentin (NEURONTIN) capsule 400 mg: 400 mg | ORAL | @ 13:00:00 | Stop: 2021-08-31

## 2021-08-31 MED ADMIN — melatonin tablet 3 mg: 3 mg | ORAL | @ 02:00:00

## 2021-08-31 MED ADMIN — atorvastatin (LIPITOR) tablet 10 mg: 10 mg | ORAL | @ 02:00:00

## 2021-08-31 MED ADMIN — oxyCODONE (ROXICODONE) immediate release tablet 5 mg: 5 mg | ORAL | @ 02:00:00 | Stop: 2021-09-06

## 2021-08-31 MED ADMIN — furosemide (LASIX) tablet 40 mg: 40 mg | ORAL | @ 13:00:00 | Stop: 2021-08-31

## 2021-08-31 MED ADMIN — ciprofloxacin HCl (CIPRO) tablet 500 mg: 500 mg | ORAL | @ 13:00:00 | Stop: 2021-08-31

## 2021-08-31 MED ADMIN — polyethylene glycol (MIRALAX) packet 17 g: 17 g | ORAL | @ 13:00:00 | Stop: 2021-08-31

## 2021-08-31 MED ADMIN — insulin glargine (LANTUS) injection 10 Units: 10 [IU] | SUBCUTANEOUS | @ 02:00:00

## 2021-08-31 MED ADMIN — spironolactone (ALDACTONE) tablet 50 mg: 50 mg | ORAL | @ 13:00:00 | Stop: 2021-08-31

## 2021-08-31 MED ADMIN — rifAXIMin (XIFAXAN) tablet 550 mg: 550 mg | ORAL | @ 13:00:00 | Stop: 2021-08-31

## 2021-08-31 MED ADMIN — pantoprazole (PROTONIX) EC tablet 40 mg: 40 mg | ORAL | @ 13:00:00 | Stop: 2021-08-31

## 2021-08-31 MED ADMIN — cyclobenzaprine (FLEXERIL) tablet 5 mg: 5 mg | ORAL | @ 02:00:00

## 2021-08-31 MED ADMIN — rifAXIMin (XIFAXAN) tablet 550 mg: 550 mg | ORAL | @ 02:00:00 | Stop: 2021-09-01

## 2021-08-31 MED ADMIN — gabapentin (NEURONTIN) capsule 400 mg: 400 mg | ORAL | @ 02:00:00

## 2021-08-31 NOTE — Unmapped (Signed)
Physician Discharge Summary [facility]  [address] [facility]  [address]  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 *      Outpatient Provider Follow Up Issues:   Outpt Follow-up  [ ]  GI to schedule EUS for Pancreatic Cyst visualized on CTAP    Hospital Course:   Matthew Key is a 61 y.o. 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  He is now s/p treatment with IV albumin and antibiotics and improved/stable for dc home with plan for outpatient PCP and hepatology follow up.  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 Diagnostic paracentesis with ~4600 PMNs and CT AP w/ mild sigmoid colitis. Pt received IV ceftriaxone w/ repeat diagnostic paracentesis 1/16 showing significant improvement in PMNs to 282.  Had another dx para with LVP 1/18 that showed PMN decreased to 130 and has now completed 5 day course ceftriaxone for SBP. Will  RESTART PO ciprofloxacin for SBP ppx upon dc.     Secondary Adrenal Insufficiency  Follows w/ [facility], thought to be 2/2 chronic steroid exposure from multiple spinal injections. Previously on hydrocortisone daily though most recent cosyntropin stim test improved from 8.4->22.9. Endocrine consulted, no need for steroids this hospitalization given robust response to stim test and adequate treatment of infection. Has prn hydrocortisone at home in case of illness.     Poor dentition  Seen by dental team based on ICID recs given possible transplant and risk for dental infection.  Was taken for extraction on 1/19 with platelets given prior to procedure.  Had several teeth removed and will need more extractions per dental team and they will call to schedule this in outpatient setting.  Will provide oxycodone 5mg  #10 for pain control.      Chronic Medical Conditions  HTN - continue home lisinopril 2.5mg  and lasix/spiro 40/50mg  respectively.   Hx CVA vs TIA - seen in chart but no evidence on CT head 06/2021; continue home statin   T2DM - A1c 5.8% this admission; was NPH and then lower dose lantus at 10 units and BG noted to be 94 fasting.  Will dc with decreased tresiba dose to 10 units and have him hold ozempic for now and check blood sugars at home and follow up with PCP.  Touchbase with Outpatient Provider:  Warm Handoff: Completed on 08/31/21 by Mervin Kung  (APP) via Epic Secure Chat    Procedures:  Paracentesis and dental extraction  No admission procedures for hospital encounter.  ______________________________________________________________________  Discharge Medications:     Your Medication List      START taking these medications    acetaminophen 325 MG tablet  Commonly known as: TYLENOL  Take 2 tablets (650 mg total) by mouth every six (6) hours as needed.     oxyCODONE 5 MG immediate release tablet  Commonly known as: ROXICODONE  Take 1 tablet (5 mg total) by mouth every six (6) hours as needed for pain for up to 5 days.        CHANGE how you take these medications    CONSTULOSE 10 gram/15 mL solution  Generic drug: lactulose  TAKE BY MOUTH TWICE DAILY, ENSURE 3-4 SOFT BOWEL MOVEMENT DAILY.  What changed: Another medication with the same name was removed. Continue taking this medication, and follow the directions you see here.     insulin degludec 100 unit/mL (3 mL) Inpn  Commonly known as: TRESIBA FLEXTOUCH U-100  Inject 0.1 mL (10 Units total) under the skin at bedtime. Adjust as instructed.  What changed:   ?? how much to take  ?? when to take this     OZEMPIC 1 mg/dose (4 mg/3 mL) Pnij injection  Generic drug: semaglutide  Do NOT restart until discuss with Provider  (ozempic 1mg  weekly)  What changed:   ?? how much to take  ?? how to take this  ?? when to take this  ?? additional instructions        CONTINUE taking these medications    atorvastatin 10 MG tablet  Commonly known as: LIPITOR  Take 10 mg by mouth nightly.     BINAXNOW COVID-19 AG SELF TEST Kit  Generic drug: COVID-19 antigen test  Use as Directed on the Package     ciprofloxacin HCl 500 MG tablet  Commonly known as: CIPRO  TAKE 1 TABLET BY MOUTH IN THE MORNING     flash glucose sensor kit  1 each by Other route every fourteen (14) days. Insert 1 sensor SQ q14 days; give #2 (30 day supply) or #6 (90 day supply) per pt preference     furosemide 20 MG tablet  Commonly known as: LASIX  Take 2 tablets (40 mg total) by mouth daily.     gabapentin 400 MG capsule  Commonly known as: NEURONTIN  Take 400 mg by mouth two (2) times a day.     hydrocortisone 5 MG tablet  Commonly known as: CORTEF  Take 10 mg (2 tablets) in morning as needed on days you feel sick. If you develop severe lightheadedness or severe nausea/vomiting you should be seen in the ER.     insulin syringe-needle U-100 1 mL 31 gauge x 5/16 (8 mm) Syrg  Use once as needed for Hydrocortisone injection.Marland Kitchen     lisinopriL 2.5 MG tablet  Commonly known as: PRINIVIL,ZESTRIL  Take 2.5 mg by mouth daily.     pantoprazole 40 MG tablet  Commonly known as: PROTONIX  Take 1 tablet (40 mg total) by mouth daily.     polyethylene glycol 17 gram packet  Commonly known as: MIRALAX  Take 17 g by mouth daily.     rifAXIMin 550 mg Tab  Commonly known as: XIFAXAN  Take 1 tablet (550 mg total) by mouth Two (2) times  a day.     sodium chloride 0.65 % Drop  Commonly known as: AYR  1 spray.     spironolactone 25 MG tablet  Commonly known as: ALDACTONE  Take 2 tablets (50 mg total) by mouth daily.     syringe with needle 3 mL 23 x 1 Syrg  Use as needed for Hydrocortisone injection            Allergies:  Venom-honey bee  ______________________________________________________________________  Pending Test Results (if blank, then none):      Most Recent Labs:  All lab results last 24 hours -   Recent Results (from the past 24 hour(s))   POCT Glucose    Collection Time: 08/30/21  4:34 PM   Result Value Ref Range    Glucose, POC 292 (H) 70 - 179 mg/dL   POCT Glucose    Collection Time: 08/30/21  9:03 PM   Result Value Ref Range    Glucose, POC 263 (H) 70 - 179 mg/dL   POCT Glucose    Collection Time: 08/31/21  6:44 AM   Result Value Ref Range    Glucose, POC 97 70 - 179 mg/dL   Comprehensive Metabolic Panel    Collection Time: 08/31/21  7:05 AM   Result Value Ref Range    Sodium 139 135 - 145 mmol/L    Potassium 3.6 3.4 - 4.8 mmol/L    Chloride 103 98 - 107 mmol/L    CO2 29.0 20.0 - 31.0 mmol/L    Anion Gap 7 5 - 14 mmol/L    BUN 8 (L) 9 - 23 mg/dL    Creatinine 5.40 9.81 - 1.10 mg/dL    BUN/Creatinine Ratio 9     eGFR CKD-EPI (2021) Male >90 >=60 mL/min/1.26m2    Glucose 94 70 - 179 mg/dL    Calcium 8.1 (L) 8.7 - 10.4 mg/dL    Albumin 2.7 (L) 3.4 - 5.0 g/dL    Total Protein 5.1 (L) 5.7 - 8.2 g/dL    Total Bilirubin 4.4 (H) 0.3 - 1.2 mg/dL    AST 28 <=19 U/L    ALT 15 10 - 49 U/L    Alkaline Phosphatase 96 46 - 116 U/L   PT-INR    Collection Time: 08/31/21  7:05 AM   Result Value Ref Range    PT 27.8 (H) 9.8 - 12.8 sec INR 2.37      Microbiology -   Microbiology Results (last day)     Procedure Component Value Date/Time Date/Time    Ascitic/Peritoneal Fluid Culture [[id number]]  (Normal) Collected: 08/25/21 1828    Lab Status: Final result Specimen: Fluid, Ascitic from Abdomen Updated: 08/31/21 1007     Ascitic/Peritoneal Culture NO GROWTH    Narrative:      Specimen Source: Abdomen        Blood Culture, Adult [[id number]]  (Normal) Collected: 08/25/21 2215    Lab Status: Final result Specimen: Blood from 1 Peripheral Draw Updated: 08/30/21 2245     Blood Culture, Routine No Growth at 5 days    Blood Culture, Adult [[id number]]  (Normal) Collected: 08/25/21 2215    Lab Status: Final result Specimen: Blood from 1 Peripheral Draw Updated: 08/30/21 2245     Blood Culture, Routine No Growth at 5 days            Relevant Studies/Radiology (if blank, then none):  No results found.  ______________________________________________________________________  Discharge Instructions:   Activity Instructions     Activity as tolerated  Diet Instructions     Discharge diet (specify)      Discharge Nutrition Therapy: Regular          Other Instructions     Call MD for:  difficulty breathing, headache or visual disturbances      Call MD for:  persistent nausea or vomiting      Call MD for:  severe uncontrolled pain      Call MD for: Temperature > 38.5 Celsius ( > 101.3 Fahrenheit)      Discharge instructions      Mr. Catala, you were admitted to [facility] due to infection in your fluid of your abdomen and this was treated with antibitoics.  We recommend that you continue to use ciprofloxacin daily to help prevent further infection and follow up closely with the [facility] hepatology team.    Continue to check your blood sugars at home prior to meals and at bedtime and record your results and take them to follow up appointments.  Continue your home insulin and a decreased dose and stop your ozempic for now and follow up with your PCP to discuss if/when to restart this.     You had dental work done.  The dental team will call to schedule further extractions and we recommend :    To help aid the healing process please follow these instructions carefully:  ??  If you develop the following symptoms, call the Hospital Dental Clinic at [phone] or Hospital Operator at [phone] or proceed to the nearest Emergency Department.  ?? Fever greater than 101.5  ?? Shaking chills  ?? Spreading redness  ?? Trouble swallowing  ?? Inability to open the lower jaw  ?? Difficulty breathing  ?? Uncontrolled pain  ?? Wound separation  ?? Excessive swelling  ?? Uncontrolled bleeding   ?? Other concerns  ??  Home care following Oral Surgery (e.g. extractions, alveoloplasty) is important and recovery may be delayed if this is neglected. Some swelling, stiffness, pain and discomfort is to be expected after surgery. The discomfort is usually the worst for the first three days after the procedure and will gradually decrease over time.  If this is greater than expected, please call or return for care.  ??  Oral Would care  ?? Keep gauze pack in place for 30 min. with constant, firm pressure. Do not chew on the gauze.  ?? Replace gauze pack every 30 minutes until little to no bleeding is observed.    ?? A minor oozing of blood and discoloration of saliva is to be expected.  ?? Until the numbness wears off, be careful not to bite your tongue, cheek, or lips.    ?? Keep head elevated and rest quietly.  ?? If instructed or needed, put ice or a cold pack on your cheek for 10 to 20 minutes at a time. Try to do this every 1 to 2 hours for the next 3 days (when you are awake) or until the swelling goes down. Put a thin cloth between the ice and your skin.  ?? Please keep your mouth clean to aid in healing and prevent infection.  ?? Brush your teeth three times daily after meals. Avoid brushing directly on the surgical site(s).    ?? Rinse gently with warm salt water 24 hours after surgery to keep surgical site clean. To a glass of warm water, add a teaspoon of table salt.  Do not use over the counter mouthwash for two weeks.  Avoid aggressive  rinsing or spitting.   ?? Stitches placed will dissolve with time.  But sometimes, non-resorbable stitch is used, and you will be instructed to return for removal.    ?? A blood clot will form in the tooth socket after the extraction and protects the bone during healing. If that blood clot gets loose or comes out of the socket, you may have a dry socket, which exposes the bone. A dry socket may last for several days and can cause severe pain. If you get a dry socket, call the clinic during regular hours.    ??  Medications  ?? If medications were prescribed, take all prescribed medications as directed.   ?? If you were prescribed antibiotics, take it as directed until your prescription is complete.  ?? While taking the antibiotic, if you develop an upset stomach or diarrhea for greater than two days, call Hospital Dental Clinic or Hospital operator to speak with the resident on call.    ?? Resume home medications.  ?? IMPORTANT!  General anesthesia, sedation, and similar medications: If you have been given a general anesthetic, or a sedative, or drugs such as valium or narcotics (e.g. Ativan, Norco, Percocet, OxyContin, etc), it is essential that you do not drive a car or engage in any type of activity requiring normal reflex reactions such as working with power tools, going up and down stairs, working around open flames or a hotplate. Be careful of dizziness - move slowly. Sudden position changes can cause nausea. Do not make important decisions. Do not drink alcoholic beverages - combinations of depressant drugs can be fatal. These precautions also apply to prescription pain medications.  Make sure you know how this medicine affects you before you drive, use machines, or other jobs that could be dangerous if you are not alert and clear-headed.   ??  Avoid  ?? Until bleeding stops, avoid: eating, sucking on the wound, frequent spitting, using mouth washes, or exercising. You may drink liquids, as desired, but not through a straw.  ?? Do not use tobacco products while your surgical site(s) are healing.  You may call [phone] to speak with a trained quit coach who can provide help over the phone.  ?? Avoid using straws for the first few days and rubbing the area with your tongue.  These actions can loosen the blood clot and delay healing.  ?? Avoid chewing in the surgical area until your mouth heals. Soft foods like gelatin or soup might be easier to eat and may help you heal.  ??  Diet  ?? Please maintain a healthy soft food diet after surgery.  Do not eat anything that requires heavy chewing.  Examples of soft foods include grits, mashed potatoes, scrambled eggs, pasta, ground hamburger, overcooked vegetable.   ?? Gradually add solid foods to your diet as you heal. You can eat solid foods again in about a week.  ?? Drink plenty of fluids but do not use a straw.     Activity  ?? Avoid any strenuous physical activities or contact sports where your jaws may be bumped or injured.  Do not lift anything over 25 pounds.  ?? You may return to work in the next 1-3 days as you feel able.  ??  If you have any questions, please call the Hospital Dental Clinic at [phone] or the main hospital operator at [phone] and ask for the Dental Resident on-call.  Follow Up instructions and Outpatient Referrals     Ambulatory referral to Home Health      Is this a [facility] or [facility] Patient?: Yes    Home Health Options: Traditional Home Health    Is this patient at high risk for COVID 19 transmission and recommended to   stay at home during this pandemic?: No    If the patient has a diagnosis of heart failure and is already on oral   diuretics, do you want to activate the in home IV Lasix protocol?: No    Do you want agency provider parameter notifications or patient specific provider parameter notifications?: Patient Specific    Do you want to initiate remote patient monitoring?: No    Physician to follow patient's care: PCP    Disciplines requested:  Physical Therapy  Occupational Therapy       Physical Therapy requested:  Home safety evaluation  Ambulation training  Transfer training  Strengthening exercises  Evaluate and treat       Occupational Therapy Requested:  Home safety evaluation  Transfer training  ADL or IADL training  Evaluate and treat  Strengthening exercises       Call MD for:  difficulty breathing, headache or visual disturbances      Call MD for:  persistent nausea or vomiting      Call MD for:  severe uncontrolled pain      Call MD for: Temperature > 38.5 Celsius ( > 101.3 Fahrenheit)      Discharge instructions          Appointments which have been scheduled for you    Sep 03, 2021 12:00 PM  (Arrive by 11:45 AM)  RETURN  HEPATOLOGY with Marene Lenz, MD  [facility] GI MEDICINE [place] [facility]) [address]  [place] [address]  [phone]      Sep 10, 2021  9:00 AM  (Arrive by 8:45 AM)  RETURN  DIABETES with Arelia Longest, MD  [facility] DIABETES AND ENDOCRINOLOGY [place] [facility]) [address]  [place] [address]  [phone]      Jan 03, 2022  9:00 AM  (Arrive by 8:45 AM)  US ABDOMEN COMPLETE with HBR [place] RM 1  IMG ULTRASOUND [facility]) [address]  [place] [address]  [phone]   Preparing for the appointment:  Fasting before the appointment  You must refrain from eating or drinking prior to the appointment. However, if you are taking medications please take them as usual on the day of the procedure with a small sip of water.  Before the appointment  58-71 years old: Do not eat or drink anything for three (3) hours prior to the study.  5 years and older: Do not eat or drink anything for six (6) hours prior to the study.     Feb 26, 2022 11:45 AM  (Arrive by 11:30 AM)  MRI ABDOMEN W WO CONTRA    -UN with HBR MRI RM 1  IMG MRI [facility]) [address]  [place] [address]  [phone]   On appt date:  Bring recent lab work  Bring documentation of any metal object implants  Take meds as usual  Check w/physician if diabetic  You will be asked to change into a gown for your safety    On appt date do not:  Consume anything 2 hrs  Wear metallic  items including jewelry (we are not responsible for lost items)    Let us know if pt:  Claustrophobic  Metal object implant  Pregnant  Prescribed a sedative  On dialysis  Allergic to MRI dye/contrast  Kidney Failure    (Title:MRIWCNTRST)          ______________________________________________________________________  Discharge Day Services:  BP 114/60  - Pulse 66  - Temp 36.4 ??C (97.6 ??F)  - Resp 18  - Ht 172.7 cm (5' 8)  - Wt 99.8 kg (220 lb)  - SpO2 99%  - BMI 33.45 kg/m??     Pt seen on the day of discharge and determined appropriate for discharge.    Condition at Discharge: stable    Length of Discharge: I spent greater than 30 mins in the discharge of this patient.

## 2021-08-31 NOTE — Unmapped (Signed)
A/O x 4, afebrile, VSS on RA, denies pain at this time. IV clean, dry, intact, and flushed per protocol. Dental extraction done today at dental clinic. Falls precautions maintained. Call bell and bedside table within reach, bed in lowest position and locked. No falls or injuries noted this shift. Will continue with plan of care.    Problem: Adult Inpatient Plan of Care  Goal: Plan of Care Review  Outcome: Progressing  Goal: Patient-Specific Goal (Individualized)  Outcome: Progressing  Flowsheets (Taken 08/30/2021 1642)  Patient-Specific Goals (Include Timeframe): Free from falls or injuries through discharge  Individualized Care Needs: Falls, labs, VS, AC/HS  Anxieties, Fears or Concerns: None expressed  Goal: Absence of Hospital-Acquired Illness or Injury  Outcome: Progressing  Goal: Optimal Comfort and Wellbeing  Outcome: Progressing  Goal: Readiness for Transition of Care  Outcome: Progressing  Goal: Rounds/Family Conference  Outcome: Progressing     Problem: Infection  Goal: Absence of Infection Signs and Symptoms  Outcome: Progressing

## 2021-08-31 NOTE — Unmapped (Signed)
Hepatology Consult Service   Progress Note         Assessment & Plan:   Matthew Key is a 61 y.o. 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 Pt is seen in consultation at the request of Corinna Capra, MD (Med General [facility] (MDU)) for cirrhosis with SBP.    Continues to clinically improve with treatment of SBP. Renal function and bilirubin stable. His repeat diag para on 1/16 still show PMN count > 250 (was 282, so improving and close). Has finished cefepime. Diagnostic para from 08/29/2021 with only 13 neutrophils. Actively accepting offers at this time.    Has potential liver offer currently, so please keep patient inpatient and NPO      Recommendations  - appreciate ICID recommendations  - appreciate dental's assistance. Patient getting teeth removed today  - OK for therapeutic para, but would limit to 3-4L and give albumin. Most recent cell count/analysis not c/w SBP  - OK to restart home diuretics   - Continue to monitor I/Os, daily CMP, CBC, INR   - continue home hydrocortisone  - He will need to resume ciprofloxacin for SBP prophylaxis upon discharge  - Will need to 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 We will continue to follow along with you. This patient was seen and examined with Dr. Sherryll Burger.     For questions, please contact the on-call fellow for the Hepatology Consult Service at [phone].     Interval History:   No acute events overnight.     Objective:   Temp:  [36.4 ??C (97.5 ??F)-37.2 ??C (99 ??F)] 36.4 ??C (97.5 ??F)  Heart Rate:  [63-77] 75  Resp:  [18] 18  BP: (98-117)/(53-69) 117/58  FiO2 (%):  [21 %] 21 %  SpO2:  [96 %-98 %] 97 %    Gen: WDWN male in NAD, answers questions appropriately  Eyes: Sclera icteric, EOMI  Heart: RRR  Lungs: CTAB, no use of accessory muscles  Abdomen:  soft, NTND, no rebound/guarding  Extremities: No clubbing, cyanosis  Psych: Alert, appropriate mood and affect    Labs/Studies: Labs and Studies from the last 24hrs per EMR and Reviewed

## 2021-08-31 NOTE — Unmapped (Signed)
Patient Name: Matthew Key  Date of Birth: August 02, 1961  Informed Consent done: Yes.  Time out: 3:12 pm  Side of mouth and/or tooth being worked on: Maxillary arch, #4-12  Procedure being performed: Extractions of #4-12  Relevant images and results labeled and displayed: Yes.  Other required items for procedure (blood products, medications, implants, devices, special equipment) are readily available: Yes.      *Patient to return for continued extractions at later date.     Vitals pre- and post-op:   Vitals:    08/30/21 1507 08/30/21 1613   BP: 117/58 113/55   BP Site: L Arm L Arm   BP Position: Sitting Sitting   BP Cuff Size: Medium Medium   Pulse: 75 85   Temp: 36.4 ??C (97.5 ??F)    TempSrc: Temporal    SpO2: 97% 98%       Pain Score:  5    X-rays:  yes panoramic    Pre-Med:  n/a    Labs:  Platelet 73 (150-440)    Nitrous Oxide:  No     Pre-procedure:  ??? Treatment plan: Extraction of #4, #5, #6, #7, #8, #9, #10, #11 and #12  ??? Time out completed: verified patient's name, DOB, and tooth/teeth to be extracted.    ??? Explained treatment options to patient of extraction and do nothing  ??? Consent reviewed with patient/guardian and signed/phone witnessed consent.    Anesthesia:  ??? Topical anesthesia (Benzocaine 20%) applied.   ??? Type of Anesthesia used:  1 carpule Lidocaine 2% with epinephrine 1:100,000 via infiltration and 3 carpule Septocaine 4% with epinephrine 1:100,000 via infiltration greater palatine  ??? Aspiration negative  ??? Nitrous Oxide:  No     Procedure:  Attention turned to #4, #5, #6, #7, #8, #9, #10, #11 and #12      Routine extraction: #4, #5, #6, #7, #8, #9, #10, #11 and #12  Sulcular and crestal incision made with 15 blade from distal of #4 to distal of #12.  FTMP flap reflected.  Elevated with s/m/l elevators.  Delivered with 150 dental forceps.  No sinus exposure noted.   Alveoloplasty performed as needed with ronguers and bone file.  Sockets curetted and rinsed with copious saline.    Placed gelfoam in all sites.      Placed 1 x continuous interlocking suture from #4 to #12 using 3-0 chromic gut     Hemostasis achieved with gauze pressure.    Patient tolerated procedure well.  Verbal and written post-op instructions give to patient/caregiver.      OTC pain medication recommended.    Patient was satisfied with today's appointment and left in stable condition. All questions answered.     Staff members present for procedure: D'lana Noralyn Pick DA, Virgina Organ DMD, Druscilla Brownie DMD        [employer]   [facility]  Post-op Instructions for Oral Surgery      To help aid the healing process please follow these instructions carefully:    If you develop the following symptoms, call the [facility] at [phone] or Hospital Operator at [phone] or proceed to the nearest Emergency Department.  ??? Fever greater than 101.5  ??? Shaking chills  ??? Spreading redness  ??? Trouble swallowing  ??? Inability to open the lower jaw  ??? Difficulty breathing  ??? Uncontrolled pain  ??? Wound separation  ??? Excessive swelling  ??? Uncontrolled bleeding   ???  Other concerns    Home care following Oral Surgery (e.g. extractions, alveoloplasty) is important and recovery may be delayed if this is neglected. Some swelling, stiffness, pain and discomfort is to be expected after surgery. The discomfort is usually the worst for the first three days after the procedure and will gradually decrease over time.  If this is greater than expected, please call or return for care.    Oral Would care  ??? Keep gauze pack in place for 30 min. with constant, firm pressure. Do not chew on the gauze.  ??? Replace gauze pack every 30 minutes until little to no bleeding is observed.    ??? A minor oozing of blood and discoloration of saliva is to be expected.  ??? Until the numbness wears off, be careful not to bite your tongue, cheek, or lips.    ??? Keep head elevated and rest quietly.  ??? If instructed or needed, put ice or a cold pack on your cheek for 10 to 20 minutes at a time. Try to do this every 1 to 2 hours for the next 3 days (when you are awake) or until the swelling goes down. Put a thin cloth between the ice and your skin.  ??? Please keep your mouth clean to aid in healing and prevent infection.  ??? Brush your teeth three times daily after meals. Avoid brushing directly on the surgical site(s).    ??? Rinse gently with warm salt water 24 hours after surgery to keep surgical site clean. To a glass of warm water, add a teaspoon of table salt.  Do not use over the counter mouthwash for two weeks.  Avoid aggressive rinsing or spitting.   ??? Stitches placed will dissolve with time.  But sometimes, non-resorbable stitch is used, and you will be instructed to return for removal.    ??? A blood clot will form in the tooth socket after the extraction and protects the bone during healing. If that blood clot gets loose or comes out of the socket, you may have a dry socket, which exposes the bone. A dry socket may last for several days and can cause severe pain. If you get a dry socket, call the clinic during regular hours.      Medications  ??? If medications were prescribed, take all prescribed medications as directed.   ??? If you were prescribed antibiotics, take it as directed until your prescription is complete.  ??? While taking the antibiotic, if you develop an upset stomach or diarrhea for greater than two days, call [facility] or Hospital operator to speak with the resident on call.    ??? Resume home medications.  ??? IMPORTANT!  General anesthesia, sedation, and similar medications: If you have been given a general anesthetic, or a sedative, or drugs such as valium or narcotics (e.g. Ativan, Norco, Percocet, OxyContin, etc), it is essential that you do not drive a car or engage in any type of activity requiring normal reflex reactions such as working with power tools, going up and down stairs, working around open flames or a hotplate. Be careful of dizziness - move slowly. Sudden position changes can cause nausea. Do not make important decisions. Do not drink alcoholic beverages - combinations of depressant drugs can be fatal. These precautions also apply to prescription pain medications.  Make sure you know how this medicine affects you before you drive, use machines, or other jobs that could be dangerous if you are not alert and clear-headed.  Avoid  ??? Until bleeding stops, avoid: eating, sucking on the wound, frequent spitting, using mouth washes, or exercising. You may drink liquids, as desired, but not through a straw.  ??? Do not use tobacco products while your surgical site(s) are healing.    o You may call [phone] to speak with a trained quit coach who can provide help over the phone.  ??? Avoid using straws for the first few days and rubbing the area with your tongue. These actions can loosen the blood clot and delay healing.  ??? Avoid chewing in the surgical area until your mouth heals. Soft foods like gelatin or soup might be easier to eat and may help you heal.    Diet  ??? Please maintain a healthy soft food diet after surgery.  Do not eat anything that requires heavy chewing. Examples of soft foods include grits, mashed potatoes, scrambled eggs, pasta, ground hamburger, overcooked vegetable.   ??? Gradually add solid foods to your diet as you heal. You can eat solid foods again in about a week.  ??? Drink plenty of fluids but do not use a straw.     Activity  ??? Avoid any strenuous physical activities or contact sports where your jaws may be bumped or injured.  Do not lift anything over 25 pounds.  ??? You may return to work in the next 1-3 days as you feel able.    If you have any questions, please call the [facility] at [phone] or the main hospital operator at [phone] and ask for the Dental Resident on-call.

## 2021-08-31 NOTE — Unmapped (Signed)
Pt with adm dx of Spontaneous Bacterial Peritonitis , S/P teeth extraction  08/30/21 .  Dry gauze provided for mouth bleeding , bright red blood noted on gauge pads . Pt A & O x 4 and able to voice needs  . Pt c/o mouth pain and generalized discomfort , pain rated 6/10 on a pain scale  . Denied N/V , no SOB/dyspnea . VSS , afebrile this shift  . Pt anxious and concerned about possible updates regarding liver transplant . PRN doses of Oxycodone adm as ordered as per [facility] with relief . Med team paged re : liver transplant update . MD to notify the AM med team . NPO with sips/meds maintained  . Assistance with ADLS provided  . Falls  and safety precautions reinfroced with bed low and locked , SR up x 3  , call bell within reach . Pt resting quietly in bed with no s/sx of acute respiratory or cardiac distress noted . Will continue with current POC .    Problem: Adult Inpatient Plan of Care  Goal: Plan of Care Review  Outcome: Progressing  Goal: Patient-Specific Goal (Individualized)  Outcome: Progressing  Flowsheets (Taken 08/31/2021 0532)  Patient-Specific Goals (Include Timeframe): Pt will have adeqaute pain mgmt and remain free from falls and injuries during this shift  .  Individualized Care Needs: V/S , labs ,  ACHS , pain mgmt , S/P teeth extraction , MRSA/Contact precautions , falls/safety precautions .  Anxieties, Fears or Concerns: Pt concerned about current update on liver transplant .  Goal: Absence of Hospital-Acquired Illness or Injury  Outcome: Progressing  Intervention: Identify and Manage Fall Risk  Recent Flowsheet Documentation  Taken 08/31/2021 0400 by Harlow Ohms, RN  Safety Interventions:   fall reduction program maintained   isolation precautions   low bed   nonskid shoes/slippers when out of bed  Taken 08/31/2021 0200 by Harlow Ohms, RN  Safety Interventions:   fall reduction program maintained   environmental modification   low bed   nonskid shoes/slippers when out of bed   isolation precautions  Taken 08/31/2021 0000 by Harlow Ohms, RN  Safety Interventions:   fall reduction program maintained   environmental modification   low bed   isolation precautions   nonskid shoes/slippers when out of bed  Taken 08/30/2021 2200 by Harlow Ohms, RN  Safety Interventions:   fall reduction program maintained   environmental modification   low bed   isolation precautions   nonskid shoes/slippers when out of bed  Taken 08/30/2021 2000 by Harlow Ohms, RN  Safety Interventions:   fall reduction program maintained   environmental modification   low bed   nonskid shoes/slippers when out of bed   isolation precautions  Intervention: Prevent Skin Injury  Recent Flowsheet Documentation  Taken 08/30/2021 2000 by Harlow Ohms, RN  Skin Protection: incontinence pads utilized  Intervention: Prevent and Manage VTE (Venous Thromboembolism) Risk  Recent Flowsheet Documentation  Taken 08/30/2021 2000 by Harlow Ohms, RN  VTE Prevention/Management: anticoagulant therapy  Intervention: Prevent Infection  Recent Flowsheet Documentation  Taken 08/31/2021 0400 by Harlow Ohms, RN  Infection Prevention: rest/sleep promoted  Taken 08/31/2021 0200 by Harlow Ohms, RN  Infection Prevention: rest/sleep promoted  Taken 08/31/2021 0000 by Harlow Ohms, RN  Infection Prevention: rest/sleep promoted  Goal: Optimal Comfort and Wellbeing  Outcome: Progressing  Goal: Readiness for Transition of Care  Outcome: Progressing  Goal: Rounds/Family Conference  Outcome: Progressing  Problem: Infection  Goal: Absence of Infection Signs and Symptoms  Outcome: Progressing  Intervention: Prevent or Manage Infection  Recent Flowsheet Documentation  Taken 08/31/2021 0400 by Harlow Ohms, RN  Infection Management: aseptic technique maintained  Isolation Precautions: contact precautions maintained  Taken 08/31/2021 0200 by Harlow Ohms, RN  Infection Management: aseptic technique maintained  Isolation Precautions: contact precautions maintained  Taken 08/31/2021 0000 by Harlow Ohms, RN  Infection Management: aseptic technique maintained  Isolation Precautions: contact precautions maintained  Taken 08/30/2021 2200 by Harlow Ohms, RN  Isolation Precautions: contact precautions maintained  Taken 08/30/2021 2000 by Harlow Ohms, RN  Isolation Precautions: contact precautions maintained

## 2021-09-02 NOTE — Unmapped (Signed)
Returned on-call page. Pt stated that he had several teeth extracted while in the hospital and the bleeding has not stopped. Pt also reported black colored stools that started yesterday. Provided reassurance that stool color from swallowed blood and not at all unexpected. Advised pt to avoid using straws and try to use cold compress on bleeding area. Pt very appreciative and verbalized understanding.

## 2021-09-03 DIAGNOSIS — K7210 Chronic hepatic failure without coma: Principal | ICD-10-CM

## 2021-09-03 NOTE — Unmapped (Signed)
Pt called inquiring about scheduling appt to have his lower teeth pulled. Pt requesting this happen at the hospital vs clinic. TNC will reach out to dental clinic to discuss options. Pt states bleeding from teeth has stopped. Denies pain and is tolerating it well. Pt informed of MELD of 22. He will get labs drawn next Mon 1/30. Appts with SRF and hepatology rescheduled to 2/20. Pt expressed gratitude toward team for their caring attitudes and had a pleasant inpatient experience.

## 2021-09-04 DIAGNOSIS — K7210 Chronic hepatic failure without coma: Principal | ICD-10-CM

## 2021-09-04 DIAGNOSIS — Z7682 Awaiting organ transplant status: Principal | ICD-10-CM

## 2021-09-05 DIAGNOSIS — Z7682 Awaiting organ transplant status: Principal | ICD-10-CM

## 2021-09-05 DIAGNOSIS — G9340 Encephalopathy, unspecified: Principal | ICD-10-CM

## 2021-09-05 DIAGNOSIS — K7460 Unspecified cirrhosis of liver: Principal | ICD-10-CM

## 2021-09-05 MED ORDER — XIFAXAN 550 MG TABLET
ORAL_TABLET | Freq: Two times a day (BID) | ORAL | 5 refills | 30.00000 days
Start: 2021-09-05 — End: ?

## 2021-09-05 NOTE — Unmapped (Signed)
Opened in error. Patient last picked up Xifaxan 5 days ago at [facility] pharmacy at hospital in [place], after hospital stay. I requested a new prescription, as no refills remain. Routing to pharmacist to hopefully see if any other prescriptions should be needed for patient. Spironolactone is on file at [facility] at this time.

## 2021-09-08 LAB — COMPREHENSIVE METABOLIC PANEL
A/G RATIO: 1.1 — ABNORMAL LOW (ref 1.2–2.2)
ALBUMIN: 3.2 g/dL — ABNORMAL LOW (ref 3.8–4.8)
ALKALINE PHOSPHATASE: 119 IU/L (ref 44–121)
ALT (SGPT): 17 IU/L (ref 0–44)
AST (SGOT): 39 IU/L (ref 0–40)
BILIRUBIN TOTAL: 3 mg/dL — ABNORMAL HIGH (ref 0.0–1.2)
BLOOD UREA NITROGEN: 8 mg/dL (ref 8–27)
BUN / CREAT RATIO: 7 — ABNORMAL LOW (ref 10–24)
CALCIUM: 8.8 mg/dL (ref 8.6–10.2)
CHLORIDE: 101 mmol/L (ref 96–106)
CO2: 27 mmol/L (ref 20–29)
CREATININE: 1.22 mg/dL (ref 0.76–1.27)
GLOBULIN, TOTAL: 2.8 g/dL (ref 1.5–4.5)
GLUCOSE: 196 mg/dL — ABNORMAL HIGH (ref 70–99)
POTASSIUM: 3.9 mmol/L (ref 3.5–5.2)
SODIUM: 141 mmol/L (ref 134–144)
TOTAL PROTEIN: 6 g/dL (ref 6.0–8.5)

## 2021-09-08 LAB — PROTIME-INR
INR: 1.7 — ABNORMAL HIGH (ref 0.9–1.2)
PROTHROMBIN TIME: 17.3 s — ABNORMAL HIGH (ref 9.1–12.0)

## 2021-09-08 NOTE — Unmapped (Signed)
Endocrinology Clinic Follow Up Note    ASSESSMENT AND PLAN:     1. Type 2 diabetes, uncontrolled without any known complications: HbA1c 5.8% today but likely artificially low due to hemolytic anemia/splenomegaly, previously 8.5% (05/2021), 7.6% (02/2021). CGM downloaded and interpreted x72 hours or more, most recent data from >1 month ago. Daily trends reviewed including patterns for frequent hyperglycemia. BGs well-controlled over past month per patient report. Goal HbA1c 7.0%.    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; may be candidate for SGLT-2 but will hold off for now as BGs well-controlled and BP low already on Lasix and Spironolactone  - RTC in 3 months    Diabetes-related complications:   - Retinopathy: Last eye exam in around 02/2021, normal per patient  - Neuropathy: No symptoms, foot exam completed 05/2021, discussed proper foot care  - Nephropathy: Last urine MA/C N/A and GFR 69 (05/2021), urine MA/C not ordered as unlikely to change management  > on Lisinopril 2.5mg   - Lipids/ASCVD risk: Last lipids normal (12/2020)  > on Atorvastatin 10mg   - HTN: BP 89/49, no symptoms, on Lisinopril 2.5mg , Lasix 20mg , Spironolactone 50mg   - Immunizations: S/p COVID and flu vaccine    2. History of secondary AI; resolved: Thought to be due to chronic illness and steroid exposure (spinal injections) but appears to have improved based on last stim test 8.4 -> 22.9 (06/2021) with ACTH 18, previously 0.7 -> 9.3 (12/2020) with ACTH < 5. No s/sxs of residual adrenal insufficiency that would suggest underreplacement.     - S/p normal cosyntropin stim test (8.4 -> 22.9) in 06/2021, stopped daily Hydrocortisone and has also not needed PRN dose during illness    Patient was discussed with Dr. Tiburcio Pea.    Arelia Longest, M.D. PGY-5 Endocrinology Fellow  [facility] at [place]  Phone:  [phone]     Fax:  [phone]    SUBJECTIVE:     Matthew Key is a 61 y.o. male with a PMHx of T2DM, AI, cirrhosis, CVA, and OSA on CPAP who is seen in follow-up regarding T2DM. I last saw him in 05/2021.     He was admitted recently at [facility] (01/14-01/23/2023) for SBP after stopping prophylaxis. He reports overall doing well and has been remembering to take all of his meds since discharge.    Regarding his diabetes, he was diagnosed 5 years ago, in his 57s. He is currently on Tresiba 40 units every morning and Ozempic 1mg  weekly. He has [employer] supplies but has not worn it over the past month due to cost, $70/month. He checks his BGs >5 times/day with fasting and mid-day BGs 100-150s. He does not have any issues with hypoglycemia. He eats 3 meals a day. He does not have any blurry vision, numbness, or tingling. He is currently on Atorvastatin. He has history of stroke.    Regarding his history of adrenal insufficiency, he denies lightheadedness other than occasional orthostatic symptoms. He has not needed any Hydrocortisone including during illness.    He is concerned about being on transplant list for some time but not being able to get transplant. He is crossing fingers, toes, etc. to get transplant over next few months.  Medical History Surgical History   Past Medical History:   Diagnosis Date   ??? Arthritis    ??? Chronic pain disorder     Lower back   ??? Cirrhosis (CMS-HCC)    ??? Dental abscess 10/2020   ??? GERD (gastroesophageal reflux disease)    ??? History of transfusion    ??? Hypertension     under control with meds and weight loss   ??? Liver disease    ??? Sleep apnea, obstructive     Have machine   ??? Stroke (CMS-HCC)     mild stroke   ??? Type 2 diabetes mellitus with diabetic neuropathy, with long-term current use of insulin (CMS-HCC) 06/09/2014      Past Surgical History:   Procedure Laterality Date   ??? KNEE SURGERY     ??? PR CATH PLACE/CORON ANGIO, IMG SUPER/INTERP,R&L HRT CATH, L HRT VENTRIC N/A 12/19/2020    Procedure: CATH LEFT/RIGHT HEART CATHETERIZATION;  Surgeon: Rosana Hoes, MD;  Location: [facility] CATH;  Service: Cardiology   ??? PR UPPER GI ENDOSCOPY,BIOPSY N/A 10/12/2020    Procedure: UGI ENDOSCOPY; WITH BIOPSY, SINGLE OR MULTIPLE;  Surgeon: Marene Lenz, MD;  [facility];  Service: Gastroenterology   ??? ROOT CANAL      Front teeth          Social History Family History   Social History     Tobacco Use   ??? Smoking status: Never   ??? Smokeless tobacco: Never   Substance Use Topics   ??? Alcohol use: Not Currently     Comment: light drinker. No drink in over a year .      Family History   Problem Relation Age of Onset   ??? Edema Mother    ??? Alzheimer's disease Father    ??? Aortic dissection Brother    ??? Early death Brother    ??? Aneurysm Brother           Medications     Current Outpatient Medications:   ???  acetaminophen (TYLENOL) 325 MG tablet, Take 2 tablets (650 mg total) by mouth every six (6) hours as needed., Disp: , Rfl: 0  ???  atorvastatin (LIPITOR) 10 MG tablet, Take 10 mg by mouth nightly., Disp: , Rfl:   ???  BINAXNOW COVID-19 AG SELF TEST Kit, Use as Directed on the Package, Disp: , Rfl:   ???  ciprofloxacin HCl (CIPRO) 500 MG tablet, TAKE 1 TABLET BY MOUTH IN THE MORNING, Disp: 30 tablet, Rfl: 0  ???  CONSTULOSE 10 gram/15 mL solution, TAKE BY MOUTH TWICE DAILY, ENSURE 3-4 SOFT BOWEL MOVEMENT DAILY., Disp: , Rfl:   ???  flash glucose sensor kit, 1 each by Other route every fourteen (14) days. Insert 1 sensor SQ q14 days; give #2 (30 day supply) or #6 (90 day supply) per pt preference, Disp: 2 each, Rfl: 5  ???  furosemide (LASIX) 20 MG tablet, Take 2 tablets (40 mg total) by mouth daily., Disp: 60 tablet, Rfl: 0  ???  gabapentin (NEURONTIN) 400 MG capsule, Take 400 mg by mouth two (2) times a day., Disp: , Rfl:   ???  insulin degludec (TRESIBA FLEXTOUCH U-100) 100 unit/mL (3 mL) InPn, Inject 0.4 mL (40 Units total) under the skin at bedtime. Adjust as instructed., Disp: 15 mL, Rfl: 3  ???  insulin syringe-needle U-100 1 mL 31 gauge x 5/16 (8 mm) Syrg, Use once as needed for Hydrocortisone injection.., Disp: 10 each, Rfl:  0  ???  lisinopriL (PRINIVIL,ZESTRIL) 2.5 MG tablet, Take 2.5 mg by mouth daily., Disp: , Rfl:   ???  pantoprazole (PROTONIX) 40 MG tablet, Take 1 tablet (40 mg total) by mouth daily., Disp: 30 tablet, Rfl: 0  ???  pen needle, diabetic (BD ULTRA-FINE NANO PEN NEEDLE) 32 gauge x 5/32 (4 mm) Ndle, ok to sub any brand or size needle preferred by insurance/patient, use 1-2x/day, dx E11.65, Disp: 100 each, Rfl: 12  ???  polyethylene glycol (MIRALAX) 17 gram packet, Take 17 g by mouth daily., Disp: 30 packet, Rfl: 0  ???  rifAXIMin (XIFAXAN) 550 mg Tab, Take 1 tablet (550 mg total) by mouth Two (2) times a day., Disp: 60 tablet, Rfl: 5  ???  semaglutide (OZEMPIC) 1 mg/dose (4 mg/3 mL) PnIj injection, Inject 1 mg under the skin every seven (7) days., Disp: 3 mL, Rfl: 3  ???  sodium chloride (AYR) 0.65 % Drop, 1 spray., Disp: , Rfl:   ???  spironolactone (ALDACTONE) 25 MG tablet, Take 2 tablets (50 mg total) by mouth daily., Disp: 60 tablet, Rfl: 0  ???  syringe with needle 3 mL 23 x 1 Syrg, Use as needed for Hydrocortisone injection, Disp: 1 each, Rfl: 0         Allergies   Allergies   Allergen Reactions   ??? Venom-Honey Bee Swelling          Review of Systems: 10 systems reviewed and were negative except for as noted in HPI and below.    OBJECTIVE:     Physical Exam:  BP 89/49  - Pulse 65  - Ht 172.7 cm (5' 7.99)  - Wt 94.3 kg (208 lb)  - BMI 31.64 kg/m??   General: Slightly ill-appearing Caucasian male in no apparent distress  HEENT: EOMI, no exophthalmos, sclera anicteric, no thyromegaly, no cushingoid features  Cardiovascular: Regular rate and rhythm; no murmurs, rubs, or gallops; 2+ radial pulses  Respiratory: Clear to auscultation bilaterally; no increased work of breathing on RA  Abdominal: Soft, non-distended, no lipohypertrophy  MSK: Normal station and gait; no edema  Neuro: Awake, alert, and oriented to person, place, and time; no focal deficits; no tremors  Psych: Normal mood and affect    Labs:  I personally reviewed labs available in Epic prior to the start of today's visit.    Labs are significant for:  Lab Results   Component Value Date    A1C 5.8 (H) 08/25/2021    A1C 8.5 (H) 05/09/2021    A1C 7.6 (H) 03/05/2021    A1C 5.9 12/13/2019    A1C 4.9 05/20/2018     Lab Results   Component Value Date    TRIG 95 12/13/2019    CHOL 149 12/13/2019    HDL 63 12/13/2019    LDL 68 12/13/2019     Lab Results   Component Value Date    TSH 1.185 06/05/2021    FREET4 1.22 06/05/2021    FREET3 2.17 (L) 12/21/2020     Lab Results   Component Value Date    CREATININE 1.22 09/07/2021     Lab Results   Component Value Date    NA 141 09/07/2021    K 3.9 09/07/2021    CL 101 09/07/2021    CO2 27 09/07/2021    BUN 8 09/07/2021    CREATININE 1.22 09/07/2021    AST 39 09/07/2021    PROT 6.0 09/07/2021    ALBUMIN 2.7 (L) 08/31/2021  No results found for: FSH, LH  No results found for: ESTRADIOL  No results found for: TESTOSTERONE  No results found for: PROLACTIN  No components found for: ILGF1  Lab Results   Component Value Date    CORTISOL 5.7 08/26/2021     No components found for: ALDOSTERON  No results found for: PTH  No results found for: CA  Lab Results   Component Value Date    ALBUMIN 2.7 (L) 08/31/2021    ALBUMIN 2.8 (L) 08/30/2021    ALBUMIN 2.5 (L) 08/29/2021     Lab Results   Component Value Date    PHOS 3.0 06/05/2021    PHOS 3.1 05/09/2021    PHOS 2.9 12/18/2020     No components found for: VITD26    Imaging:  I personally reviewed imaging available in Epic prior to the start of today's visit.    Imaging is significant for: N/A    I reviewed and summarized (above) records in preparation for today's visit all pertinent notes in Epic/Media and CareEverywhere as well as any sent records.

## 2021-09-10 ENCOUNTER — Ambulatory Visit: Admit: 2021-09-10 | Discharge: 2021-09-10 | Payer: MEDICARE

## 2021-09-10 ENCOUNTER — Ambulatory Visit
Admit: 2021-09-10 | Discharge: 2021-09-10 | Payer: MEDICARE | Attending: Student in an Organized Health Care Education/Training Program | Primary: Student in an Organized Health Care Education/Training Program

## 2021-09-10 DIAGNOSIS — E1140 Type 2 diabetes mellitus with diabetic neuropathy, unspecified: Principal | ICD-10-CM

## 2021-09-10 DIAGNOSIS — K7210 Chronic hepatic failure without coma: Principal | ICD-10-CM

## 2021-09-10 DIAGNOSIS — Z794 Long term (current) use of insulin: Principal | ICD-10-CM

## 2021-09-10 MED ORDER — RIFAXIMIN 550 MG TABLET
ORAL_TABLET | Freq: Two times a day (BID) | ORAL | 5 refills | 30.00000 days | Status: CP
Start: 2021-09-10 — End: ?
  Filled 2021-11-14: qty 60, 30d supply, fill #0

## 2021-09-10 MED ORDER — OZEMPIC 1 MG/DOSE (4 MG/3 ML) SUBCUTANEOUS PEN INJECTOR
SUBCUTANEOUS | 3 refills | 28 days | Status: CP
Start: 2021-09-10 — End: ?

## 2021-09-10 MED ORDER — INSULIN DEGLUDEC (U-100) 100 UNIT/ML (3 ML) SUBCUTANEOUS PEN
Freq: Every evening | SUBCUTANEOUS | 3 refills | 37 days | Status: CP
Start: 2021-09-10 — End: ?

## 2021-09-10 MED ORDER — PEN NEEDLE, DIABETIC 32 GAUGE X 5/32" (4 MM)
12 refills | 0 days | Status: CP
Start: 2021-09-10 — End: ?

## 2021-09-10 NOTE — Unmapped (Signed)
No meter\pump downloaded. POC glucose done today. PP 8:00pm. 99 mg/dL.

## 2021-09-10 NOTE — Unmapped (Addendum)
-   Continue Tresiba 40 units every morning.  - Continue Ozempic 1mg  every week.  - Wear a Libre monitor 2 weeks before your next appointment.

## 2021-09-14 LAB — COMPREHENSIVE METABOLIC PANEL
A/G RATIO: 1 — ABNORMAL LOW (ref 1.2–2.2)
ALBUMIN: 3.1 g/dL — ABNORMAL LOW (ref 3.8–4.8)
ALKALINE PHOSPHATASE: 153 IU/L — ABNORMAL HIGH (ref 44–121)
ALT (SGPT): 21 IU/L (ref 0–44)
AST (SGOT): 51 IU/L — ABNORMAL HIGH (ref 0–40)
BILIRUBIN TOTAL (MG/DL) IN SER/PLAS: 3.4 mg/dL — ABNORMAL HIGH (ref 0.0–1.2)
BLOOD UREA NITROGEN: 5 mg/dL — ABNORMAL LOW (ref 8–27)
BUN / CREAT RATIO: 4 — ABNORMAL LOW (ref 10–24)
CALCIUM: 9.1 mg/dL (ref 8.6–10.2)
CHLORIDE: 103 mmol/L (ref 96–106)
CO2: 26 mmol/L (ref 20–29)
CREATININE: 1.15 mg/dL (ref 0.76–1.27)
GLOBULIN, TOTAL: 3 g/dL (ref 1.5–4.5)
GLUCOSE: 189 mg/dL — ABNORMAL HIGH (ref 70–99)
POTASSIUM: 3.4 mmol/L — ABNORMAL LOW (ref 3.5–5.2)
SODIUM: 139 mmol/L (ref 134–144)
TOTAL PROTEIN: 6.1 g/dL (ref 6.0–8.5)

## 2021-09-14 LAB — PROTIME-INR
INR: 1.7 — ABNORMAL HIGH (ref 0.9–1.2)
PROTHROMBIN TIME: 17.7 s — ABNORMAL HIGH (ref 9.1–12.0)

## 2021-09-14 NOTE — Unmapped (Signed)
Pt called stating he is feeling lightheaded, having headaches and overall feeling off for the past week. Concerned his ammonia level is elevated or that something is wrong. States he is taking meds, having BMs but smaller and less frequently (irregular bowel pattern for pt). Pt endorses nausea, no vomiting. Delayed thoughts. While on phone with TNC, stated he had just arrived at [facility] where his sister works for eval. Note routed to [name] for awareness. Pt knows how to page on call coordinator if needed over the weekend.

## 2021-09-17 DIAGNOSIS — K7210 Chronic hepatic failure without coma: Principal | ICD-10-CM

## 2021-09-19 NOTE — Unmapped (Signed)
Overnight on Saturday/Sunday Feb 4, patient called on call coordinator to report that he was in the ED getting his ammonia levels checked.  Attempted to update MELD score but could not because ED did not collect an INR.  Per chart review, ultimately he was sent home after local ED determined he did not require hospitalization.     Followed up with him on 2/8.  He said he is feeling much better and plans to get labs drawn on Thursday 2/9.

## 2021-09-20 NOTE — Unmapped (Signed)
Patient decline refill for Xifaxan due to having a month supply on hand.

## 2021-09-21 LAB — COMPREHENSIVE METABOLIC PANEL
A/G RATIO: 0.9 — ABNORMAL LOW (ref 1.2–2.2)
ALBUMIN: 3 g/dL — ABNORMAL LOW (ref 3.8–4.8)
ALKALINE PHOSPHATASE: 186 IU/L — ABNORMAL HIGH (ref 44–121)
ALT (SGPT): 17 IU/L (ref 0–44)
AST (SGOT): 41 IU/L — ABNORMAL HIGH (ref 0–40)
BILIRUBIN TOTAL (MG/DL) IN SER/PLAS: 3.9 mg/dL — ABNORMAL HIGH (ref 0.0–1.2)
BLOOD UREA NITROGEN: 8 mg/dL (ref 8–27)
BUN / CREAT RATIO: 7 — ABNORMAL LOW (ref 10–24)
CALCIUM: 8.6 mg/dL (ref 8.6–10.2)
CHLORIDE: 98 mmol/L (ref 96–106)
CO2: 24 mmol/L (ref 20–29)
CREATININE: 1.17 mg/dL (ref 0.76–1.27)
GLOBULIN, TOTAL: 3.5 g/dL (ref 1.5–4.5)
GLUCOSE: 228 mg/dL — ABNORMAL HIGH (ref 70–99)
POTASSIUM: 3.7 mmol/L (ref 3.5–5.2)
SODIUM: 135 mmol/L (ref 134–144)
TOTAL PROTEIN: 6.5 g/dL (ref 6.0–8.5)

## 2021-09-21 LAB — PROTIME-INR
INR: 1.8 — ABNORMAL HIGH (ref 0.9–1.2)
PROTHROMBIN TIME: 18.2 s — ABNORMAL HIGH (ref 9.1–12.0)

## 2021-09-21 NOTE — Unmapped (Signed)
Insurance has been verified for 2023   ??  ??Patient??now??has active coverage with??[employer] Adv.??  ??  Authorization [id number] Liver transplant??listing valid until??01/09/22  ??  ??Patient is financially cleared for Liver transplant??listing

## 2021-09-21 NOTE — Unmapped (Signed)
Updated Matthew Key Police MELD score in [place] today with the following labs:  Recertification of  MELD is 21 via UNOS    Matthew Key was also noted to have the following Mild Encephalopathy and Moderate Ascites and this was noted in his MELD upgrade today.  Lab Results   Component Value Date    CREATININE 1.17 09/20/2021    NA 135 09/20/2021    BILITOT 3.9 (H) 09/20/2021          ALB 3.0 (L) 09/20/2021    INR 1.8 (H) 09/20/2021

## 2021-09-24 DIAGNOSIS — K7210 Chronic hepatic failure without coma: Principal | ICD-10-CM

## 2021-09-24 NOTE — Unmapped (Signed)
I saw and evaluated the patient, participating in the key portions of the service.  I reviewed the resident???s note.  I agree with the resident???s findings and plan. Tameca Jerez H Malayah Demuro, MD

## 2021-09-25 ENCOUNTER — Ambulatory Visit: Admit: 2021-09-25 | Discharge: 2021-09-26 | Payer: MEDICARE

## 2021-09-25 DIAGNOSIS — K7210 Chronic hepatic failure without coma: Principal | ICD-10-CM

## 2021-09-25 DIAGNOSIS — K08493 Partial loss of teeth due to other specified cause, class III: Principal | ICD-10-CM

## 2021-09-25 DIAGNOSIS — K7460 Unspecified cirrhosis of liver: Principal | ICD-10-CM

## 2021-09-25 LAB — CBC W/ AUTO DIFF
BASOPHILS ABSOLUTE COUNT: 0 10*9/L (ref 0.0–0.1)
BASOPHILS RELATIVE PERCENT: 0.5 %
EOSINOPHILS ABSOLUTE COUNT: 0.2 10*9/L (ref 0.0–0.5)
EOSINOPHILS RELATIVE PERCENT: 3.6 %
HEMATOCRIT: 33.6 % — ABNORMAL LOW (ref 39.0–48.0)
HEMOGLOBIN: 11.5 g/dL — ABNORMAL LOW (ref 12.9–16.5)
LYMPHOCYTES ABSOLUTE COUNT: 1.7 10*9/L (ref 1.1–3.6)
LYMPHOCYTES RELATIVE PERCENT: 27 %
MEAN CORPUSCULAR HEMOGLOBIN CONC: 34.1 g/dL (ref 32.0–36.0)
MEAN CORPUSCULAR HEMOGLOBIN: 33 pg — ABNORMAL HIGH (ref 25.9–32.4)
MEAN CORPUSCULAR VOLUME: 96.6 fL — ABNORMAL HIGH (ref 77.6–95.7)
MEAN PLATELET VOLUME: 11.2 fL — ABNORMAL HIGH (ref 6.8–10.7)
MONOCYTES ABSOLUTE COUNT: 1.2 10*9/L — ABNORMAL HIGH (ref 0.3–0.8)
MONOCYTES RELATIVE PERCENT: 18.5 %
NEUTROPHILS ABSOLUTE COUNT: 3.2 10*9/L (ref 1.8–7.8)
NEUTROPHILS RELATIVE PERCENT: 50.4 %
PLATELET COUNT: 64 10*9/L — ABNORMAL LOW (ref 150–450)
RED BLOOD CELL COUNT: 3.48 10*12/L — ABNORMAL LOW (ref 4.26–5.60)
RED CELL DISTRIBUTION WIDTH: 15.5 % — ABNORMAL HIGH (ref 12.2–15.2)
WBC ADJUSTED: 6.3 10*9/L (ref 3.6–11.2)

## 2021-09-25 LAB — PROTIME-INR
INR: 1.99
PROTIME: 23.2 s — ABNORMAL HIGH (ref 9.8–12.8)

## 2021-09-25 MED ORDER — CODEINE SULFATE 30 MG TABLET
ORAL_TABLET | Freq: Four times a day (QID) | ORAL | 0 refills | 3 days | Status: CN | PRN
Start: 2021-09-25 — End: ?

## 2021-09-25 MED ORDER — CHLORHEXIDINE GLUCONATE 0.12 % MOUTHWASH
Freq: Two times a day (BID) | OROMUCOSAL | 0 refills | 16.00000 days | Status: CN
Start: 2021-09-25 — End: ?

## 2021-09-25 NOTE — Unmapped (Signed)
[facility] Oral Medicine  Post-op Instructions for Oral Surgery      To help aid the healing process please follow these instructions carefully:    If you develop the following symptoms, call the [facility] at [phone] or Hospital Operator at [phone] or proceed to the nearest Emergency Department.  Fever greater than 101.5  Shaking chills  Spreading redness  Trouble swallowing  Inability to open the lower jaw  Difficulty breathing  Uncontrolled pain  Wound separation  Excessive swelling  Uncontrolled bleeding   Other concerns    Home care following Oral Surgery (e.g. extractions, alveoloplasty) is important and recovery may be delayed if this is neglected. Some swelling, stiffness, pain and discomfort is to be expected after surgery. The discomfort is usually the worst for the first three days after the procedure and will gradually decrease over time.  If this is greater than expected, please call or return for care.    Oral Would care  Keep gauze pack in place for 30 min. with constant, firm pressure. Do not chew on the gauze.  Replace gauze pack every 30 minutes until little to no bleeding is observed.    A minor oozing of blood and discoloration of saliva is to be expected.  Until the numbness wears off, be careful not to bite your tongue, Lucion Dilger, or lips.    Keep head elevated and rest quietly.  If instructed or needed, put ice or a cold pack on your Liyah Higham for 10 to 20 minutes at a time. Try to do this every 1 to 2 hours for the next 3 days (when you are awake) or until the swelling goes down. Put a thin cloth between the ice and your skin.  Please keep your mouth clean to aid in healing and prevent infection.  Brush your teeth three times daily after meals. Avoid brushing directly on the surgical site(s).    Rinse gently with warm salt water 24 hours after surgery to keep surgical site clean. To a glass of warm water, add a teaspoon of table salt.  Do not use over the counter mouthwash for two weeks.  Avoid aggressive rinsing or spitting.   Stitches placed will dissolve with time.  But sometimes, non-resorbable stitch is used, and you will be instructed to return for removal.    A blood clot will form in the tooth socket after the extraction and protects the bone during healing. If that blood clot gets loose or comes out of the socket, you may have a dry socket, which exposes the bone. A dry socket may last for several days and can cause severe pain. If you get a dry socket, call the clinic during regular hours.      Medications  If medications were prescribed, take all prescribed medications as directed.   If you were prescribed antibiotics, take it as directed until your prescription is complete.  While taking the antibiotic, if you develop an upset stomach or diarrhea for greater than two days, call [facility] or Hospital operator to speak with the resident on call.    Resume home medications.  IMPORTANT!  General anesthesia, sedation, and similar medications: If you have been given a general anesthetic, or a sedative, or drugs such as valium or narcotics (e.g. Ativan, Norco, Percocet, OxyContin, etc), it is essential that you do not drive a car or engage in any type of activity requiring normal reflex reactions such as working with power  tools, going up and down stairs, working around open flames or a hotplate. Be careful of dizziness - move slowly. Sudden position changes can cause nausea. Do not make important decisions. Do not drink alcoholic beverages - combinations of depressant drugs can be fatal. These precautions also apply to prescription pain medications.  Make sure you know how this medicine affects you before you drive, use machines, or other jobs that could be dangerous if you are not alert and clear-headed.     Avoid  Until bleeding stops, avoid: eating, sucking on the wound, frequent spitting, using mouth washes, or exercising. You may drink liquids, as desired, but not through a straw.  Do not use tobacco products while your surgical site(s) are healing.  You may call [phone] to speak with a trained quit coach who can provide help over the phone.  Avoid using straws for the first few days and rubbing the area with your tongue.  These actions can loosen the blood clot and delay healing.  Avoid chewing in the surgical area until your mouth heals. Soft foods like gelatin or soup might be easier to eat and may help you heal.    Diet  Please maintain a healthy soft food diet after surgery.  Do not eat anything that requires heavy chewing.  Examples of soft foods include grits, mashed potatoes, scrambled eggs, pasta, ground hamburger, overcooked vegetable.   Gradually add solid foods to your diet as you heal. You can eat solid foods again in about a week.  Drink plenty of fluids but do not use a straw.     Activity  Avoid any strenuous physical activities or contact sports where your jaws may be bumped or injured.  Do not lift anything over 25 pounds.  You may return to work in the next 1-3 days as you feel able.    If you have any questions, please call the [facility] at [phone] or the main hospital operator at [phone] and ask for the Dental Resident on-call.

## 2021-09-25 NOTE — Unmapped (Signed)
Patient Name: Matthew Key  Date of Birth: [birth year]  Informed Consent done: Yes.  Time out: 11:20am  Side of mouth and/or tooth being worked on: upper left and lower jaw  Procedure being performed: extraction of teeth #15, 19, 20, 21, and 28.  Relevant images and results labeled and displayed: Yes.  Other required items for procedure (blood products, medications, implants, devices, special equipment) are readily available: Yes.    Vitals pre- and post-op:   Vitals:    09/25/21 1049 09/25/21 1237   BP: 144/66 136/61   BP Site: L Arm L Arm   BP Position: Supine Supine   BP Cuff Size: Medium Medium   Pulse: 81 101   Temp: 36.7 ??C (98 ??F) 36.6 ??C (97.9 ??F)   TempSrc: Temporal Temporal   SpO2: 99% 100%       Pain Score:  0    X-rays:  no     Pre-Med:  n/a    Labs:  Platelet 64 (150-440)    Nitrous Oxide:  No     Pre-procedure:  ??? Treatment plan: Extraction of #15, #19, #20, #21 and #28  ??? Time out completed: verified patient's name, DOB, and tooth/teeth to be extracted.    ??? Explained treatment options to patient of extraction  ??? Consent reviewed with patient/guardian and signed/phone witnessed consent.    Anesthesia:  ??? Topical anesthesia (Benzocaine 20%) applied.   ??? Type of Anesthesia used:  3 carpule Lidocaine 2% with epinephrine 1:100,000 via infiltration and 2 carpule Septocaine 4% with epinephrine 1:100,000 via infiltration  ??? Aspiration negative  ??? Nitrous Oxide:  No     Procedure:  Attention turned to #15, #19, #20, #21 and #28     Routine extraction: #15, #19, #20, #21 and #28  Soft tissue reflection with woodson and periosteal elevator.  Elevated with s/m/l elevators.  Delivered with 150 and 151 dental forceps.  No sinus exposure noted.    Socket curetted and rinsed with copious saline.    Placed gelfoam in 5 sites.       Placed 2 x figure 8 suture in area of #15 and #28 and 1 x continuous interlocking suture from #19 to 21 using 3-0 chromic gut and 4-0 Vicryl    Hemostasis achieved with gauze pressure.    Patient tolerated procedure well.  Verbal and written post-op instructions give to patient/caregiver.      OTC pain medication recommended.    Patient was satisfied with today's appointment and left in stable condition. All questions answered.     Staff members present for procedure: Richardson Chiquito, DMD; Druscilla Brownie, DDS; Jacinta Shoe, CMA

## 2021-09-26 NOTE — Unmapped (Signed)
CMP needed for MELD update that was not drawn yesterday. TNC called pt who stated he was having a lot of discomfort after having several teeth extracted at [facility]. Pt attempted several times to reach on call dentistry but was unable to connect. TNC reached out to dental clinicthis morning to follow up. Per [facility], meds have been sent to requested pharm [facility]) and pt was made aware. Pt will get CMP drawn today at [facility].    Pt called back re: Rx for mouthwash he thought he was supposed to start s/p extractions. Per Dr Haywood Pao with Dentistry, OTC Peridex is more appropriate for pt. Call placed to pt to make him aware. He verbalized understanding and was appreciative of clarification.

## 2021-09-26 NOTE — Unmapped (Signed)
Attempted to call patient again but went straight to voicemail each time. Discussed situation with hospital operator to page DENTAL and not OFMS if pt pages back. Hospital operator indicates understanding not to contact [facility] anymore and to include updated number since the phone number that keeps coming with the page is not working.

## 2021-09-26 NOTE — Unmapped (Signed)
Pt paged hospital for dental. Dental answered immediately and called back. Call went immediately to VM. LVM

## 2021-09-26 NOTE — Unmapped (Signed)
Called pt 2 more times after LVM after dental page (4 total calls). Pt not responding and calls going directly to VM. Will try once more in 15 mins.

## 2021-09-26 NOTE — Unmapped (Signed)
Pt paged dental. Dental called back 2 xs. No answer. LVM

## 2021-09-27 LAB — COMPREHENSIVE METABOLIC PANEL
A/G RATIO: 0.9 — ABNORMAL LOW (ref 1.2–2.2)
ALBUMIN: 3 g/dL — ABNORMAL LOW (ref 3.8–4.8)
ALKALINE PHOSPHATASE: 165 IU/L — ABNORMAL HIGH (ref 44–121)
ALT (SGPT): 20 IU/L (ref 0–44)
AST (SGOT): 49 IU/L — ABNORMAL HIGH (ref 0–40)
BILIRUBIN TOTAL (MG/DL) IN SER/PLAS: 6.3 mg/dL — ABNORMAL HIGH (ref 0.0–1.2)
BLOOD UREA NITROGEN: 9 mg/dL (ref 8–27)
BUN / CREAT RATIO: 10 (ref 10–24)
CALCIUM: 8.8 mg/dL (ref 8.6–10.2)
CHLORIDE: 98 mmol/L (ref 96–106)
CO2: 25 mmol/L (ref 20–29)
CREATININE: 0.91 mg/dL (ref 0.76–1.27)
GLOBULIN, TOTAL: 3.5 g/dL (ref 1.5–4.5)
GLUCOSE: 127 mg/dL — ABNORMAL HIGH (ref 70–99)
POTASSIUM: 4 mmol/L (ref 3.5–5.2)
SODIUM: 133 mmol/L — ABNORMAL LOW (ref 134–144)
TOTAL PROTEIN: 6.5 g/dL (ref 6.0–8.5)

## 2021-09-27 LAB — PROTIME-INR
INR: 1.6 — ABNORMAL HIGH (ref 0.9–1.2)
PROTHROMBIN TIME: 16.8 s — ABNORMAL HIGH (ref 9.1–12.0)

## 2021-09-27 NOTE — Unmapped (Signed)
Updated [place] Key Police MELD score in [place] today with the following labs:  Recertification of  MELD is 22 via UNOS    [name] was also noted to have the following Mild Encephalopathy and Moderate Ascites and this was noted in his MELD upgrade today.    Lab Results   Component Value Date    CREATININE 0.91 09/26/2021    NA 133 (L) 09/26/2021    BILITOT 6.3 (H) 09/26/2021    ALB 3.0 (L) 09/26/2021    INR 1.6 (H) 09/26/2021

## 2021-10-01 ENCOUNTER — Ambulatory Visit: Admit: 2021-10-01 | Discharge: 2021-10-02 | Payer: MEDICARE

## 2021-10-01 DIAGNOSIS — K7210 Chronic hepatic failure without coma: Principal | ICD-10-CM

## 2021-10-01 DIAGNOSIS — K7460 Unspecified cirrhosis of liver: Principal | ICD-10-CM

## 2021-10-01 LAB — COMPREHENSIVE METABOLIC PANEL
ALBUMIN: 2.6 g/dL — ABNORMAL LOW (ref 3.4–5.0)
ALKALINE PHOSPHATASE: 209 U/L — ABNORMAL HIGH (ref 46–116)
ALT (SGPT): 18 U/L (ref 10–49)
ANION GAP: 6 mmol/L (ref 5–14)
AST (SGOT): 48 U/L — ABNORMAL HIGH (ref <=34)
BILIRUBIN TOTAL: 4.1 mg/dL — ABNORMAL HIGH (ref 0.3–1.2)
BLOOD UREA NITROGEN: 5 mg/dL — ABNORMAL LOW (ref 9–23)
BUN / CREAT RATIO: 4
CALCIUM: 8.8 mg/dL (ref 8.7–10.4)
CHLORIDE: 103 mmol/L (ref 98–107)
CO2: 26.7 mmol/L (ref 20.0–31.0)
CREATININE: 1.15 mg/dL — ABNORMAL HIGH
EGFR CKD-EPI (2021) MALE: 72 mL/min/{1.73_m2} (ref >=60)
GLUCOSE RANDOM: 124 mg/dL — ABNORMAL HIGH (ref 70–99)
POTASSIUM: 4.2 mmol/L (ref 3.4–4.8)
PROTEIN TOTAL: 6.7 g/dL (ref 5.7–8.2)
SODIUM: 136 mmol/L (ref 135–145)

## 2021-10-01 LAB — CBC
HEMATOCRIT: 33.6 % — ABNORMAL LOW (ref 39.0–48.0)
HEMOGLOBIN: 11.5 g/dL — ABNORMAL LOW (ref 12.9–16.5)
MEAN CORPUSCULAR HEMOGLOBIN CONC: 34.3 g/dL (ref 32.0–36.0)
MEAN CORPUSCULAR HEMOGLOBIN: 33.7 pg — ABNORMAL HIGH (ref 25.9–32.4)
MEAN CORPUSCULAR VOLUME: 98.3 fL — ABNORMAL HIGH (ref 77.6–95.7)
MEAN PLATELET VOLUME: 10.9 fL — ABNORMAL HIGH (ref 6.8–10.7)
PLATELET COUNT: 64 10*9/L — ABNORMAL LOW (ref 150–450)
RED BLOOD CELL COUNT: 3.42 10*12/L — ABNORMAL LOW (ref 4.26–5.60)
RED CELL DISTRIBUTION WIDTH: 16.3 % — ABNORMAL HIGH (ref 12.2–15.2)
WBC ADJUSTED: 5.3 10*9/L (ref 3.6–11.2)

## 2021-10-01 LAB — AFP TUMOR MARKER: AFP-TUMOR MARKER: <2 ng/mL (ref <=8)

## 2021-10-01 LAB — PROTIME-INR
INR: 1.93
PROTIME: 22.4 s — ABNORMAL HIGH (ref 9.8–12.8)

## 2021-10-01 NOTE — Unmapped (Signed)
[facility]UP [facility]INIC NOTE    REFERRING PROVIDER: Card, [name], MD  PRIMARY CARE PROVIDER: Loman Brooklyn, MD    DATE OF SERVICE: 10/01/2021    Subjective   SUBJECTIVE:     CHIEF COMPLAINT: follow-up for routine cirrhosis care    HISTORY OF PRESENT ILLNESS:      Matthew Key is a 61 y.o. 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 His cirrhosis has been complicated by ascites, hepatic encephalopathy and non-bleeding esophageal varices.??Patient???s prior issues have included hyponatremia, adrenal insufficiency, SBP and volume overload. He is now actively listed for LT.     INTERVAL HISTORY:(since the time of the patient's last visit):    Last seen 06/25/2021  ??  - Reactivated on transplant list 06/27/2021  - Received paracentesis with 2.6 liters removed on 08/22/2021  - Admitted 1/16-1/20 with SBP  - Developed lightheadedness, headaches and feeling off after discharge on 2/3. Evaluated at local ED for possible HE and was discharged.  - Currently taking lactulose 2-3 times per day with 2 BMs per day and rifaximin 550 mg BID  - Animal naming test with 19 animals in 1 minutes  - Teeth extracted on 2/14, painful but otherwise ok  - MELD-Na reactivated at 22 on 09/27/2021  - Ascites well controlled on lasix 40 mg daily and spironolactone 50 mg daily  - Continues on ciprofloxacin for SBP prophylaxis  - HbA1c 5.8    Wt Readings from Last 6 Encounters:   10/01/21 92.4 kg (203 lb 12.8 oz)   09/10/21 94.3 kg (208 lb)   08/26/21 99.8 kg (220 lb)   07/03/21 89.8 kg (198 lb)   06/25/21 92.1 kg (203 lb)   06/18/21 92.1 kg (203 lb)     REVIEW OF SYSTEMS:   The balance of 12 systems reviewed is negative except as noted in the HPI.     PAST MEDICAL HISTORY:  Past Medical History:   Diagnosis Date   ??? AKI (acute kidney injury) (CMS-HCC) 12/14/2020   ??? Anxiety 10/22/2013 ??? Arthritis    ??? Cervical radiculopathy 12/03/2016   ??? Chronic pain disorder     Lower back   ??? Cirrhosis (CMS-HCC)    ??? Dental abscess 10/2020   ??? Duodenal ulcer 12/01/2017   ??? GERD (gastroesophageal reflux disease)    ??? History of transfusion    ??? Hyperlipidemia 10/22/2013   ??? Hypertension     under control with meds and weight loss   ??? Liver disease    ??? Sleep apnea, obstructive     Have machine   ??? Stroke (CMS-HCC)     mild stroke   ??? Type 2 diabetes mellitus with diabetic neuropathy, with long-term current use of insulin (CMS-HCC) 06/09/2014       MEDICATIONS:   Current Outpatient Medications   Medication Sig Dispense Refill   ??? acetaminophen (TYLENOL) 325 MG tablet Take 2 tablets (650 mg total) by mouth every six (6) hours as needed.  0   ??? atorvastatin (LIPITOR) 10 MG tablet Take 10 mg by mouth nightly.     ??? BINAXNOW COVID-19 AG SELF TEST Kit Use as Directed on the Package     ??? ciprofloxacin HCl (CIPRO) 500 MG tablet TAKE 1 TABLET BY MOUTH IN THE MORNING 30 tablet 0   ??? CONSTULOSE 10 gram/15 mL solution  TAKE BY MOUTH TWICE DAILY, ENSURE 3-4 SOFT BOWEL MOVEMENT DAILY.     ??? flash glucose sensor kit 1 each by Other route every fourteen (14) days. Insert 1 sensor SQ q14 days; give #2 (30 day supply) or #6 (90 day supply) per pt preference 2 each 5   ??? furosemide (LASIX) 20 MG tablet Take 2 tablets (40 mg total) by mouth daily. 60 tablet 0   ??? gabapentin (NEURONTIN) 400 MG capsule Take 400 mg by mouth two (2) times a day.     ??? hydrOXYzine (VISTARIL) 50 MG capsule      ??? insulin degludec (TRESIBA FLEXTOUCH U-100) 100 unit/mL (3 mL) InPn Inject 0.4 mL (40 Units total) under the skin at bedtime. Adjust as instructed. 15 mL 3   ??? insulin syringe-needle U-100 1 mL 31 gauge x 5/16 (8 mm) Syrg Use once as needed for Hydrocortisone injection.. 10 each 0   ??? lisinopriL (PRINIVIL,ZESTRIL) 2.5 MG tablet Take 2.5 mg by mouth daily.     ??? omeprazole (PRILOSEC) 20 MG capsule Take 20 mg by mouth daily.     ??? ondansetron (ZOFRAN-ODT) 4 MG disintegrating tablet TAKE 1 TABLET BY MOUTH EVERY 6 HOURS FOR 7 DAYS     ??? pantoprazole (PROTONIX) 40 MG tablet Take 1 tablet (40 mg total) by mouth daily. 30 tablet 0   ??? PAXLOVID CO-PACK, EUA, (NIRMATRELVIR-RITONAVIR) 300 mg (150 mg x 2)-100 mg tablet SMARTSIG:By Mouth     ??? pen needle, diabetic (BD ULTRA-FINE NANO PEN NEEDLE) 32 gauge x 5/32 (4 mm) Ndle ok to sub any brand or size needle preferred by insurance/patient, use 1-2x/day, dx E11.65 100 each 12   ??? polyethylene glycol (MIRALAX) 17 gram packet Take 17 g by mouth daily. 30 packet 0   ??? rifAXIMin (XIFAXAN) 550 mg Tab Take 1 tablet (550 mg total) by mouth Two (2) times a day. 60 tablet 5   ??? semaglutide (OZEMPIC) 1 mg/dose (4 mg/3 mL) PnIj injection Inject 1 mg under the skin every seven (7) days. 3 mL 3   ??? sodium chloride (AYR) 0.65 % Drop 1 spray.     ??? spironolactone (ALDACTONE) 25 MG tablet Take 2 tablets (50 mg total) by mouth daily. 60 tablet 0   ??? syringe with needle 3 mL 23 x 1 Syrg Use as needed for Hydrocortisone injection 1 each 0   ??? traMADoL (ULTRAM) 50 mg tablet        No current facility-administered medications for this visit.       ALLERGIES:   Venom-honey bee       Objective   OBJECTIVE:   VITAL SIGNS: BP 115/63  - Pulse 64  - Temp 36.2 ??C (97.1 ??F) (Temporal)  - Wt 92.4 kg (203 lb 12.8 oz)  - SpO2 100%  - BMI 31.00 kg/m??      Wt Readings from Last 3 Encounters:   10/01/21 92.4 kg (203 lb 12.8 oz)   09/10/21 94.3 kg (208 lb)   08/26/21 99.8 kg (220 lb)       PHYSICAL EXAM:  Constitutional: Alert, Oriented x 3, No acute distress, well nourished and well hydrated.   HEENT: PERRL, conjunctiva clear, anicteric, oropharynx clear, neck supple, no LAD.   CV: Regular rate and rhythm, normal S1, S2. No murmurs.   Lung: Unlabored breathing. Decreased breath sounds to right lung base, but otherwise clear to auscultation  Abdomen: Normoactive bowel sounds, soft, no appreciable ascites  Extremities: No edema, warm.  MSK: No joint swelling or tenderness noted, no deformities.  Skin: No rashes, jaundice or skin lesions noted on visible skin.  Neuro: Alert and oriented. No focal deficits. No asterixis.   Mental Status: Thought organized, appropriate affect, pleasantly interactive, not anxious appearing.      DIAGNOSTIC STUDIES:  I have reviewed all pertinent diagnostic studies, including:  Laboratory results:  Lab Results   Component Value Date    WBC 6.3 09/25/2021    HGB 11.5 (L) 09/25/2021    MCV 96.6 (H) 09/25/2021    PLT 64 (L) 09/25/2021    Lab Results   Component Value Date    NA 133 (L) 09/26/2021    K 4.0 09/26/2021    CL 98 09/26/2021    CREATININE 0.91 09/26/2021    GLU 94 08/31/2021    CALCIUM 8.8 09/26/2021      Lab Results   Component Value Date    ALBUMIN 2.7 (L) 08/31/2021    AST 49 (H) 09/26/2021    ALT 20 09/26/2021    ALKPHOS 165 (H) 09/26/2021    BILITOT 6.3 (H) 09/26/2021    Lab Results   Component Value Date    INR 1.6 (H) 09/26/2021        Lab Results   Component Value Date    AFPTM 3 05/09/2021    AFPTM 1.9 08/16/2020    AFPTM 3 01/24/2020    AFPTM 4 05/25/2019       Lab Results   Component Value Date    IRON 98 05/09/2021    FERRITIN 558.5 (H) 05/09/2021     Lab Results   Component Value Date    CHOL 149 12/13/2019    TRIG 95 12/13/2019    A1C 5.8 (H) 08/25/2021     Lab Results   Component Value Date    VITDTOTAL 27.9 09/11/2020     Lab Results   Component Value Date    HEPAIGG Reactive (A) 05/09/2021    HBSAG Nonreactive 06/05/2021    HEPBSAB Nonreactive 06/05/2021    HEPBCAB Nonreactive 06/05/2021    HEPCAB Nonreactive 06/05/2021    HIV Nonreactive 06/05/2021     Lab Results   Component Value Date    ANA Negative 05/09/2021    SMOOTHMUSCAB Positive (A) 05/09/2021    MITOAB Negative 05/09/2021       MELD-Na score: 22 at 09/26/2021 11:14 AM  Calculated from:  Serum Creatinine: 0.91 mg/dL (Using min of 1 mg/dL) at 1/61/0960 45:40 AM  Serum Sodium: 133 mmol/L at 09/26/2021 11:14 AM  Total Bilirubin: 6.3 mg/dL at 9/81/1914 78:29 AM  INR(ratio): 1.6 at 09/26/2021 11:14 AM  Age: 68 years    Radiographic studies:  Reviewed    GI Procedures:  Upper endoscopy 10/12/20:    - Scar in the lower third of the esophagus from prior                          esophageal rupture.                         - Multiple white plaques in the lower third of the                          esophagus in area of scar. Brushings performed.                         -  Grade I esophageal varices. No high risk varices in                          distal 5 cm of esophagus requiring banding.                         - Portal hypertensive gastropathy.                         - Gastric lipoma.                         - Gastric antral vascular ectasia.                         - Non-bleeding duodenal ulcers with no stigmata of                          bleeding and duodentitis. Biopsied.       ASSESSMENT AND PLAN:   [name] is a 61 y.o. 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 His cirrhosis has been complicated by ascites, hepatic encephalopathy and non-bleeding esophageal varices.??Patient???s prior issues have included hyponatremia, adrenal insufficiency, SBP and volume overload. He is now actively listed for LT.  ??  Patient is dealing with poor appetite and also endorses some anxiety related to his transplant issues and deaths in the family. We will refer to transplant nutrition and psychology. Will continue current dose of diuretics with no excess swelling on exam today. Encouraged him to try an extra dose of lactulose every day to see if this improved energy and appetite.    Plan:  -Labs today  -Referal to transplant nutritionist and transplant psychologist (dealing with a lot of health-related anxiety)  -Continue current dose of diuretics  -Increased lactulose to 3-4 times per day and continue rifaximin 550 mg BID  -Continue low sodium diet and weekly lab checks  -Continue cipro 500mg  qd indefinitely for SBP ppx  -Continue ongoing care for DM - Hb much better now!  -Avoid additional QTc prolonging medications given prolonged QTc at baseline  -MRI due in 02/2022 (ordered)  -Return in 2 month    Health Care Maintenance:  - EGD: UTD, last 10/12/20 with grade I EV  - HCC surveillance: UTD, due 02/2022  - SBP prophylaxis [criteria: h/o SBP, or CP ? 9 w/ bili ? 3, renal insufficiency, or Na ? 130]: on cipro  - Nutrition: wt & exercise mgmnt, low Na intake and bedtime snack discussed.  - OTC agents: proper use of acetaminophen and avoidance of NSAIDs & herbal/dietary supplements discussed.  - Counseled on need to avoid shellfish    Vaccinations: We recommend that patients have vaccinations to prevent various infections that can occur, especially in the setting of having underlying liver disease. The following vaccinations should be given:  -Hepatitis A: immune  -Hepatitis B: completed series 2019  -Influenza (yearly)  -Pneumococcal: Prevnar 2019  -Covid: 3/3 done  -Zoster: Shingrix (age > 50). 2/2 done    I spent a majority of my time today with the patient reviewing historical data, old records (including faxed data and data in Care Everywhere), performing a physical  examination, and formulating an assessment and plan together with the patient.    Tawni Carnes, MD, MPH  Assistant Professor of Medicine  Division of Gastroenterology and Hepatology

## 2021-10-01 NOTE — Unmapped (Signed)
-  Labs today  -Referal to transplant nutritionist and transplant psychologist (dealing with a lot of health-related anxiety)  -Continue current dose of diuretics  -Increased lactulose to 3-4 times per day and continue rifaximin 550 mg BID  -Continue low sodium diet and weekly lab checks  -Continue cipro 500mg  qd indefinitely for SBP ppx  -Continue ongoing care for DM  -Avoid additional QTc prolonging medications given prolonged QTc at baseline  -MRI due in 02/2022 (ordered)  -Return in 2 month    Tawni Carnes, MD MPH  [facility]  Assistant Professor  Division of Gastroenterology and Hepatology      How to reach me:    MyChart Message: for non-emergency questions; may take up to 48-72 hours to respond    2. Nurse Line: Theophilus Kinds  Phone: [phone]  Fax: [phone]      Important Contact Numbers:    GI Clinic Appointments:? [phone]  Please call the GI Clinic appointment line if you need to schedule, reschedule or cancel an appointment in clinic.? They can also answer any questions you may have about where your appointment is located and when you need to arrive.    GI Procedure Appointments:?[phone]  Please call the GI Procedures line if you need to schedule, reschedule or cancel ANY type of procedure (EGD, colonoscopy, motility testing, etc).? You can also call this number for prep instructions, etc.?    Radiology: [phone], option 3 or 4  If you are being scheduled for any type of radiology, you will need to call to receive your appointment time.? Please call this number for information.?    For emergencies after normal business hours or on weekends/holidays  Proceed to the nearest emergency room OR contact the [facility] Operator at [phone] who can page the Gastroenterology Fellow on call.    Financial Assistance [phone] or email [email or web address]      Pharmacy Assistance: [phone]    Counseling Resources: [email or web address]

## 2021-10-02 NOTE — Unmapped (Signed)
Called patient in regards to scheduling him for follow up appointments with the Transplant Psychologist and Nutritionist. Let him know that I see that he is coming to [place] on Monday, February 27th to see the Transplant Surgeon-Dr. Lucianne Muss at 10:20am. Let him know that The Psychologist and Nutritionist have available appointments at 9:00am and 1:00pm. Asked if he would like for me to add them to this day. He stated that would be fine and would like to have those appointments on this day. Told him that the Nutritionist would be at 9:00am and the Psychologist would be at 1:00pm. He stated understanding. Let him know once I get everything scheduled that I will send him an appointment letter to his [facility]. Again he stated understanding.

## 2021-10-05 NOTE — Unmapped (Signed)
Error

## 2021-10-08 ENCOUNTER — Ambulatory Visit: Admit: 2021-10-08 | Discharge: 2021-10-09 | Payer: MEDICARE

## 2021-10-08 ENCOUNTER — Ambulatory Visit: Admit: 2021-10-08 | Discharge: 2021-10-08 | Payer: MEDICARE

## 2021-10-08 ENCOUNTER — Ambulatory Visit: Admit: 2021-10-08 | Discharge: 2021-10-08 | Payer: MEDICARE | Attending: Psychologist | Primary: Psychologist

## 2021-10-08 ENCOUNTER — Ambulatory Visit: Admit: 2021-10-08 | Discharge: 2021-10-08 | Payer: MEDICARE | Attending: Surgery | Primary: Surgery

## 2021-10-08 DIAGNOSIS — K7210 Chronic hepatic failure without coma: Principal | ICD-10-CM

## 2021-10-08 DIAGNOSIS — K760 Fatty (change of) liver, not elsewhere classified: Principal | ICD-10-CM

## 2021-10-08 DIAGNOSIS — K7211 Chronic hepatic failure with coma: Principal | ICD-10-CM

## 2021-10-08 NOTE — Unmapped (Signed)
Transplant Surgery History and Physical      Assessment/Recommendations:    Matthew Key is a 61 y.o. male seen in consultation at the request of Marene Lenz, MD for evaluation of candidacy for transplantation.    I spent 45 minutes with the patient obtaining the above history and physical examination, and greater than 50% of the time was spent counseling and on the substance of the discussion.    Today we discussed liver transplantation going over the surgery to be performed, the hospital course including length of stay, anti-rejection medications and their side effects, results and the cadaveric donor system.    I discussed in detail with Matthew Key the risks and benefits of liver transplantation, including but not limited to: the general anesthetic, monitoring lines, the incision, the hepatectomy, as well as reimplantation of the liver graft and immunosuppressant medications. In regards to the surgical procedure, we noted that it is a major operation performed under general anesthesia with the risks of heart attack, stroke and death. Multiple invasive means of monitoring may be necessary during the operation including an arterial line, a central venous catheter, a foley catheter inserted into the bladder, and a tube from your nose into your stomach to prevent stomach distension. After surgery, the patient will go to the Intensive Care Unit and is then sent to the regular floor when medically stable. I discussed the possible complications including the need for reoperation for bleeding, infection or other complications, the possibility of clotting/leakage of blood vessels, requiring either radiological intervention, surgical intervention, or even retransplantation. I reviewed the possibility of complications involving the biliary tract including leaks, strictures and need for retransplantation for biliary complications. I discussed the possibility of primary nonfunction of the liver graft requiring urgent retransplantation or the result of death. The patient understands the need for long-term immunosuppression therapy as well as monitoring of labs and immunosuppression. Anti-rejection medications, including Prograf or Cyclosporine (Neoral), Cellcept, steroids and others, will be needed after transplantation and for the patient???s entire lifetime. Problems include infection, cancer, hirsutism, tremors, gum swelling, hypertension, bone fractures, aggravation of diabetes or new onset diabetes, cataracts, and rashes. Finally, the donor system was reviewed. All donors are tested for infections and other diseases, but there is a small chance of transmission of diseases including viruses as well as the possible transmission of tumors. Some patients may elect to receive a liver from a donor who was exposed to the Hepatitis B or Hepatitis C virus and the recipient may require certain anti-viral medications to prevent this virus from damaging the new liver.    Finally, I reviewed with the patient how the surgery is expected to improve their health and quality of life, that the average length of hospitalization stay is 10-12 days, and that the length of their expected recovery period, including when normal daily activities may be resumed, will be patient dependent.    Matthew Key had all their questions answered and wishes to proceed with the liver transplant evaluation process.    This patient was seen and re-evaluated. Patient has been established with Endocrinology with improvement in HbA1c has improved, most recent value 5.8 on 08/25/21. MELD-Na score increased at 22 on 2/20 and patient is now having worsening symptoms related to hyperammonemia.     - MRI abdomen still needs to be completed  - Continue daily exercise   - Patient met with Dietician prior to our visit, given decreased appetite patient needs to be monitored closely from a  nutritional standpoint  - Cardiac cath done last year showed moderate pulmonary HTN with mean PA 46. Needs to be monitored.      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 His MELD-Na score 2/20 is 22 (Cr:1.15, Bilirubin:4.1, INR:1.93, Na:136), was 20 last visit 05/2021.     Patient was hospitalized 1/14-1/20 for SBP which he was treated with IV rocephin and resumed on cipro ppx on discharge. He received 3 diagnostic paracenteses during admission, denies requiring any therapeutic paracenteses since. Patient reports presenting to outside ED on 2/10 for 'not feeling well'. He states he was found to have ammonia of 156 at this time and was told to follow up with GI. His lactulose was increased to 3-4x daily at this time. He has been established with endocrinology since last visit and HbA1c has improved to 5.8 on 08/25/21.    He reports that over the past 3 weeks he has had decreased appetite. He denies nausea, vomiting or abdominal pain. He denies changes in bowel habits and recently had lactulose dose increased 3-4x daily. Denies chest pain, SOB, fevers, chills or worsening swelling in BLE. He denies dysuria or hematuria. Reports that he continues to be active everyday. He reports going to the grocery store and walking around.       Allergies  Venom-honey bee      Medications      Current Outpatient Medications   Medication Sig Dispense Refill   ??? acetaminophen (TYLENOL) 325 MG tablet Take 2 tablets (650 mg total) by mouth every six (6) hours as needed.  0   ??? atorvastatin (LIPITOR) 10 MG tablet Take 10 mg by mouth nightly.     ??? BINAXNOW COVID-19 AG SELF TEST Kit Use as Directed on the Package     ??? ciprofloxacin HCl (CIPRO) 500 MG tablet TAKE 1 TABLET BY MOUTH IN THE MORNING 30 tablet 0   ??? CONSTULOSE 10 gram/15 mL solution TAKE BY MOUTH TWICE DAILY, ENSURE 3-4 SOFT BOWEL MOVEMENT DAILY.     ??? flash glucose sensor kit 1 each by Other route every fourteen (14) days. Insert 1 sensor SQ q14 days; give #2 (30 day supply) or #6 (90 day supply) per pt preference 2 each 5   ??? furosemide (LASIX) 20 MG tablet Take 2 tablets (40 mg total) by mouth daily. 60 tablet 0   ??? gabapentin (NEURONTIN) 400 MG capsule Take 400 mg by mouth two (2) times a day.     ??? hydrOXYzine (VISTARIL) 50 MG capsule      ??? insulin degludec (TRESIBA FLEXTOUCH U-100) 100 unit/mL (3 mL) InPn Inject 0.4 mL (40 Units total) under the skin at bedtime. Adjust as instructed. 15 mL 3   ??? insulin syringe-needle U-100 1 mL 31 gauge x 5/16 (8 mm) Syrg Use once as needed for Hydrocortisone injection.. 10 each 0   ??? lisinopriL (PRINIVIL,ZESTRIL) 2.5 MG tablet Take 2.5 mg by mouth daily.     ??? omeprazole (PRILOSEC) 20 MG capsule Take 20 mg by mouth daily.     ??? ondansetron (ZOFRAN-ODT) 4 MG disintegrating tablet TAKE 1 TABLET BY MOUTH EVERY 6 HOURS FOR 7 DAYS     ??? pantoprazole (PROTONIX) 40 MG tablet Take 1 tablet (40 mg total) by mouth daily. 30 tablet 0   ??? PAXLOVID  CO-PACK, EUA, (NIRMATRELVIR-RITONAVIR) 300 mg (150 mg x 2)-100 mg tablet SMARTSIG:By Mouth     ??? pen needle, diabetic (BD ULTRA-FINE NANO PEN NEEDLE) 32 gauge x 5/32 (4 mm) Ndle ok to sub any brand or size needle preferred by insurance/patient, use 1-2x/day, dx E11.65 100 each 12   ??? polyethylene glycol (MIRALAX) 17 gram packet Take 17 g by mouth daily. 30 packet 0   ??? rifAXIMin (XIFAXAN) 550 mg Tab Take 1 tablet (550 mg total) by mouth Two (2) times a day. 60 tablet 5   ??? semaglutide (OZEMPIC) 1 mg/dose (4 mg/3 mL) PnIj injection Inject 1 mg under the skin every seven (7) days. 3 mL 3   ??? sodium chloride (AYR) 0.65 % Drop 1 spray.     ??? spironolactone (ALDACTONE) 25 MG tablet Take 2 tablets (50 mg total) by mouth daily. 60 tablet 0   ??? syringe with needle 3 mL 23 x 1 Syrg Use as needed for Hydrocortisone injection 1 each 0   ??? traMADoL (ULTRAM) 50 mg tablet No current facility-administered medications for this visit.         Past Medical History    Past Medical History:   Diagnosis Date   ??? AKI (acute kidney injury) (CMS-HCC) 12/14/2020   ??? Anxiety 10/22/2013   ??? Arthritis    ??? Cervical radiculopathy 12/03/2016   ??? Chronic pain disorder     Lower back   ??? Cirrhosis (CMS-HCC)    ??? Dental abscess 10/2020   ??? Duodenal ulcer 12/01/2017   ??? GERD (gastroesophageal reflux disease)    ??? History of transfusion    ??? Hyperlipidemia 10/22/2013   ??? Hypertension     under control with meds and weight loss   ??? Liver disease    ??? Sleep apnea, obstructive     Have machine   ??? Stroke (CMS-HCC)     mild stroke   ??? Type 2 diabetes mellitus with diabetic neuropathy, with long-term current use of insulin (CMS-HCC) 06/09/2014         Past Surgical History    Past Surgical History:   Procedure Laterality Date   ??? KNEE SURGERY     ??? PR CATH PLACE/CORON ANGIO, IMG SUPER/INTERP,R&L HRT CATH, L HRT VENTRIC N/A 12/19/2020    Procedure: CATH LEFT/RIGHT HEART CATHETERIZATION;  Surgeon: Rosana Hoes, MD;  Location: [facility] CATH;  Service: Cardiology   ??? PR UPPER GI ENDOSCOPY,BIOPSY N/A 10/12/2020    Procedure: UGI ENDOSCOPY; WITH BIOPSY, SINGLE OR MULTIPLE;  Surgeon: Marene Lenz, MD;  [facility]: [facility];  Service: Gastroenterology   ??? ROOT CANAL      Front teeth         Family History    The patient's family history includes Alzheimer's disease in his father; Aneurysm in his brother; Aortic dissection in his brother; Early death in his brother; Edema in his mother..      Social History:    Tobacco use: denies  Alcohol use: No EtOH in over a 2 years  Drug use: denies      Review of Systems    A 12 system review of systems was negative except as noted in HPI    Objective     PE: Blood pressure 120/56, pulse 62, temperature 36.2 ??C (97.2 ??F), temperature source Tympanic, weight 90 kg (198 lb 8 oz). Body mass index is 30.19 kg/m??.  General: Large body habitus, no acute distress. Lungs: clear to auscultation, percussion to the  bases, and unlabored breathing  Heart: euvolemic, regular rate and rhythm, normal S1 and S2, no murmur  Abd: soft, non-distended, non-tender, no organomegaly or masses  Ascites: mild   Skin: no rashes, jaundice or skin lesions noted  Ext: B/L LE mild edema  Neuro: non-focal exam. thought organized, appropriate affect, normal fluent speech        Test Results  Labs:  All lab results last 24 hours:  No results found for this or any previous visit (from the past 24 hour(s)).    Imaging:   CTAP 08/25/21:  IMPRESSION:  Mural thickening noted in the sigmoid colon, possibly related to underdistention versus mild colitis, appears new compared with 06/06/2021  ??  Cirrhosis with evidence portal hypertension, as above, including moderate ascites and moderate to large right pleural effusion.  ??  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 Largest appears moderately increased in size since 05/19/2018. Follow-up recommended as below  ??  1.5 - 2.5 cm cyst, no MPD communication, OR cannot be determined       --EUS/FNA OR       --Reimage q52mo x 4, then q1y x 2, then q2y x 3            --STOP if stable over 10 years            --If interval Growth                 -- > 2.5 cm: EUS/FNA                 -- < 2.6 cm: EUS/FNA OR Reimage q52mo x 2, then q1y x 5 then q2y   ??  NOTE:  The management of incidental pancreatic cysts algorithm has been adopted to standardize MRI reporting in patients or various ages at presentation. Criteria and documentation are available online at [email or web address].     Liver US 05/30/21:  FINDINGS:   ??  LIVER: The liver is small, heterogeneous in echogenicity and nodular contour. No focal hepatic lesions. No intrahepatic biliary ductal dilatation. The common bile duct is is not well-visualized due to overlying bowel gas.  ??  GALLBLADDER: The gallbladder is physiologically distended without internal stones. Layering gallbladder sludge identified. Sonographic Eulah Pont sign is negative.   No pericholecystic fluid. No gallbladder wall thickening.  ??  PANCREAS: Poorly well-visualized due to overlying bowel gas.  ??  SPLEEN: Increased in size and normal echotexture.  ??  KIDNEYS: Normal in size and echotexture. No solid masses or calculi. No hydronephrosis.  ??  VESSELS  - Portal vein: The main, left and right portal veins are patent with hepatopetal flow. Normal main portal vein velocity (0.20 m/s or greater)  - Splenic vein: Proximal-Patent, with hepatopetal flow. Midline-not well-visualized due to overlying bowel gas.  - Hepatic veins/IVC: The IVC, left, middle and right hepatic veins are patent with bi/triphasic waveforms.  - Hepatic artery: Patent with color and spectral Doppler imaging  - Partially visualized proximal aorta:  unremarkable  ??  OTHER: Moderate volume ascites, seen on prior. Bilateral pleural effusions identified, seen on prior.  ??  IMPRESSION:  -Patent hepatic vasculature with normal flow direction.  -Cirrhotic liver with sequela portal hypertension including pleural effusions, ascites and splenomegaly, similar to prior.  -Splenomegaly, unchanged.  -Gallbladder sludge. No evidence of gallstones.  ??  Please see below for data measurements:  Liver: 14.5 cm  ??  Common bile duct: Not well vis   ??  Gallbladder wall: 0.3 cm  Sonographic Murphy's Sign: negative  Pericholecystic fluid visualized: no  ??  Right kidney: 12.1 cm   Left kidney: 11.2 cm   ??  Main portal vein diameter: 1.0 cm  ??  Main portal vein velocity: 0.23-0.25 m/s  Anterior right portal vein velocity: 0.18 m/s  Posterior right portal vein velocity: 0.18 m/s  Left portal vein velocity: 0.23 m/s  ??  Main portal vein flow: hepatopetal  Right portal vein flow: hepatopetal  Left portal vein flow: hepatopetal  ??  Common hepatic artery: Patent  ??  Left hepatic vein flow: bi-tri  Middle hepatic vein flow: bi-tri  Right hepatic vein flow: biphasic  Inferior vena cava flow: bi-tri  ??  Splenic vein midline: Non Vis   Splenic vein proximal: hepatopetal   ??  Aorta: partially visualized  Inferior vena cava: Partially visualized  ??  Spleen: 14.5 cm (previously 14.8 cm)  ??  Abdominal free fluid visualized: yes    ECHO: 05/09/2021  Summary    1. The left ventricle is upper normal in size with normal wall thickness.    2. The left ventricular systolic function is normal, LVEF is visually  estimated at 55-60%.    3. There is mild aortic regurgitation.    4. The left atrium is mildly to moderately dilated in size.    5. The right ventricle is normal in size, with normal systolic function.    6. The aorta at the sinuses of Valsalva and ascending aorta is mildly  dilated.  ??  ??  Left Ventricle    The left ventricle is upper normal in size with normal wall thickness.    The left ventricular systolic function is normal, LVEF is visually estimated  at 55-60%.    There is normal left ventricular diastolic function.  ??  Right Ventricle    The right ventricle is normal in size, with normal systolic function.  ??  ??  Left Atrium    The left atrium is mildly to moderately dilated in size.  ??  Right Atrium    The right atrium is normal  in size.  ??  ??  Aortic Valve    The aortic valve is trileaflet with mildly thickened leaflets with mildly  reduced excursion.    There is mild aortic regurgitation.    There is no evidence of a significant transvalvular gradient.  ??  Pulmonic Valve    Pulmonary valve is not well visualized.    There is no significant pulmonic regurgitation.    There is no evidence of a significant transvalvular gradient.  ??  Mitral Valve    The mitral valve leaflets are normal with normal leaflet mobility.    There is trivial mitral valve regurgitation.  ??  Tricuspid Valve    The tricuspid valve leaflets are normal, with normal leaflet mobility.    There is trivial tricuspid regurgitation.    The pulmonary systolic pressure cannot be estimated due to insufficient TR  jet.  ??  ??  Pericardium/Pleural    There is no pericardial effusion.  ??  Inferior Vena Cava    IVC size and inspiratory change suggest normal right atrial pressure. (0-5  mmHg).  ??  Aorta    The aorta at the sinuses of Valsalva and ascending aorta is mildly dilated.  ??  ??  Left Ventricular Outflow Tract  ----------------------------------------------------------------------  Name  Value        Normal  ----------------------------------------------------------------------  ??  LVOT 2D  ----------------------------------------------------------------------  LVOT Diameter                       2.7 cm                 LVOT Area                          5.7 cm2  ??  Pulmonic Valve  ----------------------------------------------------------------------  Name                                 Value        Normal  ----------------------------------------------------------------------  ??  PV Doppler  ----------------------------------------------------------------------  PV Peak Velocity                   1.3 m/s  ??  Mitral Valve  ----------------------------------------------------------------------  Name                                 Value        Normal  ----------------------------------------------------------------------  ??  MV Diastolic Function  ----------------------------------------------------------------------  MV E Peak Velocity                 96 cm/s                 MV A Peak Velocity                111 cm/s                 MV E/A                                 0.9                 ??  MV Annular TDI  ----------------------------------------------------------------------  MV Septal e' Velocity             7.1 cm/s         >=8.0   MV Lateral e' Velocity           12.1 cm/s        >=10.0   MV e' Average                          9.6                 MV E/e' (Average)                     10.8  ??  Tricuspid Valve  ----------------------------------------------------------------------  Name                                 Value        Normal  ----------------------------------------------------------------------  ??  TV Regurgitation Doppler  ----------------------------------------------------------------------  TR Peak Velocity                     2 m/s                 ??  Estimated PAP/RSVP  ----------------------------------------------------------------------  RA Pressure                         3 mmHg           <=5   RV Systolic Pressure               23 mmHg           <36  ??  Aorta  ----------------------------------------------------------------------  Name                                 Value        Normal  ----------------------------------------------------------------------  ??  Ascending Aorta  ----------------------------------------------------------------------  Ao Root Diameter (2D)               4.1 cm                 Ao Root Diam Index (2D)         45.5 cm/m2                 Asc Ao Diameter                     4.3 cm  ??  Venous  ----------------------------------------------------------------------  Name                                 Value        Normal  ----------------------------------------------------------------------  ??  IVC/SVC  ----------------------------------------------------------------------  IVC Diameter (Exp 2D)               1.7 cm         <=2.1  ??  Aortic Valve  ----------------------------------------------------------------------  Name                                 Value        Normal  ----------------------------------------------------------------------  ??  AV Doppler  ----------------------------------------------------------------------  AV Peak Velocity                   1.8 m/s  ??  Ventricles  ----------------------------------------------------------------------  Name                                 Value Normal  ----------------------------------------------------------------------  ??  LV Dimensions 2D/MM  ----------------------------------------------------------------------  IVS Diastolic Thickness (2D)        0.8 cm       0.6-1.0   LVID Diastole (2D)                  5.5 cm       4.2-5.8   LVPW Diastolic Thickness  (2D)                                0.8 cm       0.6-1.0   LVID Systole (2D)                   3.2 cm       2.5-4.0   LVOT Diameter  2.7 cm                 ??  RV Dimensions 2D/MM  ----------------------------------------------------------------------  TAPSE                               3.5 cm         >=1.7  ??  Atria  ----------------------------------------------------------------------  Name                                 Value        Normal  ----------------------------------------------------------------------  ??  LA Dimensions  ----------------------------------------------------------------------  LA Dimension (2D)                   4.8 cm       3.0-4.1

## 2021-10-08 NOTE — Unmapped (Signed)
Matthew Key presents to the [facility] Oral Medicine clinic for a follow up visit.    Referring provider: Janyth Pupa  PCP: Loman Brooklyn, MD    Reason for visit (working diagnosis): post extraction follow up  Last visit date: 09/25/2021    Interval history  This is a pleasant 60 y.o. male with a history of liver disease who returns for a follow up visit post extractions. He stated that there was no post operative complication and he was happy with the procedure.     Patient denies having oral discomfort. Patient denies having difficulty chewing, opening/closing the mouth, or swallowing.    Vitals:    10/08/21 1440   BP: 118/56   Pulse: 68   Temp: 36.2 ??C (97.2 ??F)   SpO2: 100%          10/08/21 1440   PainSc: 0-No pain     Past Medical History:   Diagnosis Date   ??? AKI (acute kidney injury) (CMS-HCC) 12/14/2020   ??? Anxiety 10/22/2013   ??? Arthritis    ??? Cervical radiculopathy 12/03/2016   ??? Chronic pain disorder     Lower back   ??? Cirrhosis (CMS-HCC)    ??? Dental abscess 10/2020   ??? Duodenal ulcer 12/01/2017   ??? GERD (gastroesophageal reflux disease)    ??? History of transfusion    ??? Hyperlipidemia 10/22/2013   ??? Hypertension     under control with meds and weight loss   ??? Liver disease    ??? Sleep apnea, obstructive     Have machine   ??? Stroke (CMS-HCC)     mild stroke   ??? Type 2 diabetes mellitus with diabetic neuropathy, with long-term current use of insulin (CMS-HCC) 06/09/2014     Past Surgical History:   Procedure Laterality Date   ??? KNEE SURGERY     ??? PR CATH PLACE/CORON ANGIO, IMG SUPER/INTERP,R&L HRT CATH, L HRT VENTRIC N/A 12/19/2020    Procedure: CATH LEFT/RIGHT HEART CATHETERIZATION;  Surgeon: Rosana Hoes, MD;  Location: [facility] CATH;  Service: Cardiology   ??? PR UPPER GI ENDOSCOPY,BIOPSY N/A 10/12/2020    Procedure: UGI ENDOSCOPY; WITH BIOPSY, SINGLE OR MULTIPLE;  Surgeon: Marene Lenz, MD;  [facility];  Service: Gastroenterology   ??? ROOT CANAL      Front teeth Current Outpatient Medications:   ???  acetaminophen (TYLENOL) 325 MG tablet, Take 2 tablets (650 mg total) by mouth every six (6) hours as needed., Disp: , Rfl: 0  ???  acetaminophen-codeine (TYLENOL #3) 300-30 mg per tablet, TAKE 1 TABLET BY MOUTH 3 TIMES DAILY FOR 2 DAYS, Disp: , Rfl:   ???  atorvastatin (LIPITOR) 10 MG tablet, Take 10 mg by mouth nightly., Disp: , Rfl:   ???  BINAXNOW COVID-19 AG SELF TEST Kit, Use as Directed on the Package, Disp: , Rfl:   ???  ciprofloxacin HCl (CIPRO) 500 MG tablet, TAKE 1 TABLET BY MOUTH IN THE MORNING, Disp: 30 tablet, Rfl: 0  ???  CONSTULOSE 10 gram/15 mL solution, TAKE BY MOUTH TWICE DAILY, ENSURE 3-4 SOFT BOWEL MOVEMENT DAILY., Disp: , Rfl:   ???  flash glucose sensor kit, 1 each by Other route every fourteen (14) days. Insert 1 sensor SQ q14 days; give #2 (30 day supply) or #6 (90 day supply) per pt preference, Disp: 2 each, Rfl: 5  ???  furosemide (LASIX) 20 MG tablet, Take 2 tablets (40 mg total) by mouth daily., Disp:  60 tablet, Rfl: 0  ???  gabapentin (NEURONTIN) 400 MG capsule, Take 400 mg by mouth two (2) times a day., Disp: , Rfl:   ???  hydrOXYzine (VISTARIL) 50 MG capsule, , Disp: , Rfl:   ???  insulin degludec (TRESIBA FLEXTOUCH U-100) 100 unit/mL (3 mL) InPn, Inject 0.4 mL (40 Units total) under the skin at bedtime. Adjust as instructed., Disp: 15 mL, Rfl: 3  ???  insulin syringe-needle U-100 1 mL 31 gauge x 5/16 (8 mm) Syrg, Use once as needed for Hydrocortisone injection.., Disp: 10 each, Rfl: 0  ???  lisinopriL (PRINIVIL,ZESTRIL) 2.5 MG tablet, Take 2.5 mg by mouth daily., Disp: , Rfl:   ???  omeprazole (PRILOSEC) 20 MG capsule, Take 20 mg by mouth daily., Disp: , Rfl:   ???  ondansetron (ZOFRAN-ODT) 4 MG disintegrating tablet, TAKE 1 TABLET BY MOUTH EVERY 6 HOURS FOR 7 DAYS, Disp: , Rfl:   ???  pantoprazole (PROTONIX) 40 MG tablet, Take 1 tablet (40 mg total) by mouth daily., Disp: 30 tablet, Rfl: 0  ???  PAXLOVID CO-PACK, EUA, (NIRMATRELVIR-RITONAVIR) 300 mg (150 mg x 2)-100 mg tablet, SMARTSIG:By Mouth, Disp: , Rfl:   ???  pen needle, diabetic (BD ULTRA-FINE NANO PEN NEEDLE) 32 gauge x 5/32 (4 mm) Ndle, ok to sub any brand or size needle preferred by insurance/patient, use 1-2x/day, dx E11.65, Disp: 100 each, Rfl: 12  ???  polyethylene glycol (MIRALAX) 17 gram packet, Take 17 g by mouth daily., Disp: 30 packet, Rfl: 0  ???  rifAXIMin (XIFAXAN) 550 mg Tab, Take 1 tablet (550 mg total) by mouth Two (2) times a day., Disp: 60 tablet, Rfl: 5  ???  semaglutide (OZEMPIC) 1 mg/dose (4 mg/3 mL) PnIj injection, Inject 1 mg under the skin every seven (7) days., Disp: 3 mL, Rfl: 3  ???  sodium chloride (AYR) 0.65 % Drop, 1 spray., Disp: , Rfl:   ???  spironolactone (ALDACTONE) 25 MG tablet, Take 2 tablets (50 mg total) by mouth daily., Disp: 60 tablet, Rfl: 0  ???  syringe with needle 3 mL 23 x 1 Syrg, Use as needed for Hydrocortisone injection, Disp: 1 each, Rfl: 0  ???  traMADoL (ULTRAM) 50 mg tablet, , Disp: , Rfl:   Allergies   Allergen Reactions   ??? Venom-Honey Bee Swelling     Social History     Tobacco Use   ??? Smoking status: Never   ??? Smokeless tobacco: Never   Substance Use Topics   ??? Alcohol use: Not Currently     Comment: light drinker. No drink in over a year .           Occupational History   ??? Not on file     Family History   Problem Relation Age of Onset   ??? Edema Mother    ??? Alzheimer's disease Father    ??? Aortic dissection Brother    ??? Early death Brother    ??? Aneurysm Brother        Physical Examination  Constitutional: without weight loss, fever, chills, night sweats, malaise  Skin of face: without lesions  Eyes: without injections or conjunctival icterus  Ears: without external lesions  Respiratory: without stridor  Cardiovascular: without irregular rate  Musculoskeletal: without joint pain, without muscle pain except as noted  Hematologic: without lymphadenopathy  Endocrine: without thyromegaly  Neuro: without cranial nerve abnormality  Psych: Normal affect, alert and oriented  Lymph nodes: without enlargement, tenderness  Cranial Nerve exam completed: Cranial  nerves grossly intact    Examination:   His extraction sites are healing well. Some of the sutures were still present. Some sutures are still present.     Assessment  Post extraction follow up. Healing process is consistent with the time frame.     Plan  1. Patient was told to follow up in two weeks if the sutures do not come out or resorb  2. He was recommended warm salt water rinses  3. Follow up in 2 weeks. Patient knows to contact me with questions or concerns.    Druscilla Brownie, DDS  Associate Professor of Oral Medicine   Medical Director [facility] Oral Medicine  [facility]  Ph: [phone]

## 2021-10-08 NOTE — Unmapped (Signed)
[facility] Outpatient Nutrition Services   Medical Nutrition Therapy Consultation       Visit Type:    Return Assessment    Referral Reason: :  Liver Transplant Evaluation Follow Up      Matthew Key is a 60 y.o. male seen for medical nutrition therapy for liver transplant annual evaluation and difficulty eating. His active problem list, medication list and allergies were reviewed.     His interim medical history is significant for tooth extraction, mouth pain and weight loss.    Anthropometrics   Estimated body mass index is 30.19 kg/m?? as calculated from the following:    Height as of 09/10/21: 172.7 cm (5' 7.99).    Weight as of an earlier encounter on 10/08/21: 90 kg (198 lb 8 oz).    Wt Readings from Last 5 Encounters:   10/08/21 90 kg (198 lb 8 oz)   10/01/21 92.4 kg (203 lb 12.8 oz)   09/10/21 94.3 kg (208 lb)   08/26/21 99.8 kg (220 lb)   07/03/21 89.8 kg (198 lb)      Usual body weight: ~205 lbs for the past few weeks; 198 lbs per patient on home scale   Ideal Body Weight:   69.9 kg       Nutrition Risk Screening:     Nutrition Focused Physical Exam:  Fat Areas Examined  Orbital: Moderate loss  Upper Arm: Mild loss      Muscle Areas Examined  Temple: Mild loss  Clavicle: Mild loss  Acromion: Mild loss  Scapular: Mild loss     Functional Status  Hand Grip Strength: Measurably Reduced       Dominant Hand Grip Strength:   Attempt 1: 22.9   Attempt 2: 23.3   Attempt 3: 22.5   Average: 22.9  Below normal grip strength for age     Malnutrition Screening:   Malnutrition Assessment using AND/ASPEN Clinical Characteristics:  Patient does not meet AND/ASPEN criteria for malnutrition at this time (10/09/21 1510)       Biochemical Data, Medical Tests and Procedures:  All pertinent labs and imaging reviewed by Idolina Primer at 8:47 AM 10/09/2021.    Per patient his blood sugars have been well controlled - ~120- 131 when he checks, which is several times per day     Lab Results   Component Value Date Hemoglobin A1C 5.8 (H) 08/25/2021    Hemoglobin A1C 8.5 (H) 05/09/2021    Hemoglobin A1C 7.6 (H) 03/05/2021      No results found for: VITAMINA  Lab Results   Component Value Date    Vitamin D Total (25OH) 27.9 09/11/2020    Vitamin D Total (25OH) 13.9 (L) 05/25/2019     No results found for: VITAME  No results found for: CRP  No results found for: ZINC  No results found for: COPPER    Lab Results   Component Value Date    BUN 5 (L) 10/01/2021    CREATININE 1.15 (H) 10/01/2021    GFRAA 84 04/05/2019    GFRNONAA 72 04/05/2019    NA 136 10/01/2021    K 4.2 10/01/2021    CL 103 10/01/2021    CO2 26.7 10/01/2021    CALCIUM 8.8 10/01/2021    PHOS 3.0 06/05/2021    ALBUMIN 2.6 (L) 10/01/2021       Medications and Vitamin/Mineral Supplementation:   All nutritionally pertinent medications reviewed on 10/09/2021.   Nutritionally pertinent medications include: constulose, lasix, tresiba, prilosec,  miralax, spironolactone, ozempic  He is not taking nutrition supplements. None. Per patient, Dr. Waynetta Sandy had recommended he stop taking all vitamins/minerals      Current Outpatient Medications   Medication Sig Dispense Refill   ??? acetaminophen (TYLENOL) 325 MG tablet Take 2 tablets (650 mg total) by mouth every six (6) hours as needed.  0   ??? acetaminophen-codeine (TYLENOL #3) 300-30 mg per tablet TAKE 1 TABLET BY MOUTH 3 TIMES DAILY FOR 2 DAYS     ??? atorvastatin (LIPITOR) 10 MG tablet Take 10 mg by mouth nightly.     ??? BINAXNOW COVID-19 AG SELF TEST Kit Use as Directed on the Package     ??? ciprofloxacin HCl (CIPRO) 500 MG tablet TAKE 1 TABLET BY MOUTH IN THE MORNING 30 tablet 0   ??? CONSTULOSE 10 gram/15 mL solution TAKE BY MOUTH TWICE DAILY, ENSURE 3-4 SOFT BOWEL MOVEMENT DAILY.     ??? flash glucose sensor kit 1 each by Other route every fourteen (14) days. Insert 1 sensor SQ q14 days; give #2 (30 day supply) or #6 (90 day supply) per pt preference 2 each 5   ??? furosemide (LASIX) 20 MG tablet Take 2 tablets (40 mg total) by mouth daily. 60 tablet 0   ??? gabapentin (NEURONTIN) 400 MG capsule Take 400 mg by mouth two (2) times a day.     ??? hydrOXYzine (VISTARIL) 50 MG capsule      ??? insulin degludec (TRESIBA FLEXTOUCH U-100) 100 unit/mL (3 mL) InPn Inject 0.4 mL (40 Units total) under the skin at bedtime. Adjust as instructed. 15 mL 3   ??? insulin syringe-needle U-100 1 mL 31 gauge x 5/16 (8 mm) Syrg Use once as needed for Hydrocortisone injection.. 10 each 0   ??? lisinopriL (PRINIVIL,ZESTRIL) 2.5 MG tablet Take 2.5 mg by mouth daily.     ??? omeprazole (PRILOSEC) 20 MG capsule Take 20 mg by mouth daily.     ??? ondansetron (ZOFRAN-ODT) 4 MG disintegrating tablet TAKE 1 TABLET BY MOUTH EVERY 6 HOURS FOR 7 DAYS     ??? pantoprazole (PROTONIX) 40 MG tablet Take 1 tablet (40 mg total) by mouth daily. 30 tablet 0   ??? PAXLOVID CO-PACK, EUA, (NIRMATRELVIR-RITONAVIR) 300 mg (150 mg x 2)-100 mg tablet SMARTSIG:By Mouth     ??? pen needle, diabetic (BD ULTRA-FINE NANO PEN NEEDLE) 32 gauge x 5/32 (4 mm) Ndle ok to sub any brand or size needle preferred by insurance/patient, use 1-2x/day, dx E11.65 100 each 12   ??? polyethylene glycol (MIRALAX) 17 gram packet Take 17 g by mouth daily. 30 packet 0   ??? rifAXIMin (XIFAXAN) 550 mg Tab Take 1 tablet (550 mg total) by mouth Two (2) times a day. 60 tablet 5   ??? semaglutide (OZEMPIC) 1 mg/dose (4 mg/3 mL) PnIj injection Inject 1 mg under the skin every seven (7) days. 3 mL 3   ??? sodium chloride (AYR) 0.65 % Drop 1 spray.     ??? spironolactone (ALDACTONE) 25 MG tablet Take 2 tablets (50 mg total) by mouth daily. 60 tablet 0   ??? syringe with needle 3 mL 23 x 1 Syrg Use as needed for Hydrocortisone injection 1 each 0   ??? traMADoL (ULTRAM) 50 mg tablet        No current facility-administered medications for this visit.       Nutrition History:     Dietary Restrictions: No known food allergies or food intolerances.     Gastrointestinal  Issues: Denied issues No pain or nausea, just not feeling like he can eat much. Hunger and Satiety: Poor appetite and limited intake.   Early satiety. Patient reports poor appetite for the past week, and has not found foods he can eat easily. Patient also had his top teeth pulled ~3 weeks ago and bottoms were pulled a few weeks before that. Has not been fitted for dentures yet, but has dentist appointment this week per patient.   Patient reports only eating 1 meal per day for the past ~1 month.     Food Safety and Access: No to little issues noted.     Diet Recall:   Time Intake   Breakfast    Snack (AM)    Lunch    Snack (PM)    Dinner Chicken, corn and mashed potatoes    Snack (HS)      Food-Related History:  Beverages: Boost Original - Chocolate (1 per day)  Dining Out:  Minimal   Usual Food Choices: peas, green beans, peanut butter, spinach, eggs, milk, macaroni & cheese, chicken, sweet potato, brown rice     Physical Activity:  Physical activity level is sedentary with little to no exercise.    Has become fatigued very easily recently and has not been sleeping well at night. Has not had enough energy to do more than walk.     Daily Estimated Nutrient Needs:  Energy: 1750- 2100 kcals [25-30 kcal/kg using ideal body weight, 70 kg (10/09/21 1519)]  Protein: 85- 105 gm [1.2-1.5 gm/kg using ideal body weight, 70 kg (10/09/21 1519)]  Fluid:   [per MD team]  Sodium:  <2000 mg     Nutrition Goals & Evaluation      1. Meet estimated daily needs (New)  2. Normal vitamin levels (New)  3. Balanced macronutrient intake (New)  4. Hemoglobin A1c <7% (Met)    Nutrition goals reviewed, and relevant barriers identified and addressed: emotional state. He is evaluated to have good willingness and ability to achieve nutrition goals.     Nutrition Assessment     Per the patient's diet recall, nutrition intake is inadequate and does not meet estimated nutrition needs. Patient's intake of Boost shakes is appropriate for liver disease and transplant. Discussed importance of increasing Boost to at least 2 bottles per day and eating 2-3 snacks per day of peanut butter, eggs etc to improve labs and overall nutrition. Patient does not meet malnutrition criteria at this time, though if poor intake and unintentional weight loss continue he will meet criteria soon. Did not have enough time to complete full frailty assessment, but patient with weak hand grip for age. Discussed importance of regular exercise while being mindful to safety limitations. Patient with long standing diabetes and is on several medications for it. Patient with good control at this time, though intake has been very low and is not appropriate. Patient is concerned with his lack of appetite and intake, he feels like he is too weak and fatigued to do much anymore and he is frustrated that he has not been transplanted yet.       Transplant Nutrition Assessment:    The patient is currently listed for liver transplant at [facility].       Nutrition Intervention      Nutrition Education: transplant goals and expectations, cirrhosis medical nutrition therapy     Materials Provided were: Handout explaining prescribed diet    Nutrition Plan:   ??? Adhere to low sodium (<2000 milligram) and  high protein diet  ??? Consume 2 oral nutrition supplements daily    ??? Eat 4-5 small, frequent protein containing meals per day   ??? Space meals out every 3-4 hours while awake  ??? Consume a night time snack   ??? Increase exercise to 30 minutes, 5 days per week       Follow up will occur in 6 months- 1 year.     Food/Nutrition-related history and Anthropometric measurements will be assessed at time of follow-up.         Recommendations for Care Team:  Monitor functional status, if noted decline can schedule patient for formal frailty assessment       Time spent 45 minutes       Lanelle Bal, RD, LDN  Abdominal Transplant Dietitian   Pager: [phone]

## 2021-10-08 NOTE — Unmapped (Signed)
Call us in 2 weeks if the suture does not come out on its own

## 2021-10-10 NOTE — Unmapped (Signed)
Clinical Social Worker Telephone Note    Name:Matthew Key  Date of Birth:September 17, 1960  UJW:[id number]    REFERRAL INFORMATION:  Kyan Yurkovich is waitlisted for liver transplantation . CSW follows up to assess financial questions/planning.    IMPRESSION: Patient and wife had follow up appointment with Dr. Andee Poles in which they disclosed major changes to their financial situation due to the health of his wife and subsequently her no longer being able to work.   Offered to send information via [employer].   He expresses appreciation.       Flint Melter, LCSW  Transplant Case Manager  10/10/2021. 12:47 PM

## 2021-10-11 LAB — URINALYSIS WITH MICROSCOPY WITH CULTURE REFLEX
BACTERIA: NONE SEEN /HPF
BILIRUBIN UA: NEGATIVE
BLOOD UA: NEGATIVE
GLUCOSE UA: NEGATIVE
KETONES UA: NEGATIVE
LEUKOCYTE ESTERASE UA: NEGATIVE
NITRITE UA: NEGATIVE
PH UA: 6.5 (ref 5.0–9.0)
PROTEIN UA: NEGATIVE
RBC UA: <1 /HPF (ref <=3)
SPECIFIC GRAVITY UA: 1.015 (ref 1.003–1.030)
SQUAMOUS EPITHELIAL: <1 /HPF (ref 0–5)
UROBILINOGEN UA: <2
WBC UA: 1 /HPF (ref <=2)

## 2021-10-11 LAB — CBC W/ AUTO DIFF
BASOPHILS ABSOLUTE COUNT: 0 10*9/L (ref 0.0–0.1)
BASOPHILS RELATIVE PERCENT: 0.8 %
EOSINOPHILS ABSOLUTE COUNT: 0.2 10*9/L (ref 0.0–0.5)
EOSINOPHILS RELATIVE PERCENT: 3.5 %
HEMATOCRIT: 30.7 % — ABNORMAL LOW (ref 39.0–48.0)
HEMOGLOBIN: 11.1 g/dL — ABNORMAL LOW (ref 12.9–16.5)
LYMPHOCYTES ABSOLUTE COUNT: 1.7 10*9/L (ref 1.1–3.6)
LYMPHOCYTES RELATIVE PERCENT: 36.2 %
MEAN CORPUSCULAR HEMOGLOBIN CONC: 36.1 g/dL — ABNORMAL HIGH (ref 32.0–36.0)
MEAN CORPUSCULAR HEMOGLOBIN: 34.5 pg — ABNORMAL HIGH (ref 25.9–32.4)
MEAN CORPUSCULAR VOLUME: 95.8 fL — ABNORMAL HIGH (ref 77.6–95.7)
MEAN PLATELET VOLUME: 10.3 fL (ref 6.8–10.7)
MONOCYTES ABSOLUTE COUNT: 0.8 10*9/L (ref 0.3–0.8)
MONOCYTES RELATIVE PERCENT: 16.8 %
NEUTROPHILS ABSOLUTE COUNT: 2 10*9/L (ref 1.8–7.8)
NEUTROPHILS RELATIVE PERCENT: 42.7 %
PLATELET COUNT: 68 10*9/L — ABNORMAL LOW (ref 150–450)
RED BLOOD CELL COUNT: 3.2 10*12/L — ABNORMAL LOW (ref 4.26–5.60)
RED CELL DISTRIBUTION WIDTH: 16 % — ABNORMAL HIGH (ref 12.2–15.2)
WBC ADJUSTED: 4.8 10*9/L (ref 3.6–11.2)

## 2021-10-11 LAB — PROTIME-INR
INR: 1.88
PROTIME: >300 s (ref 9.8–12.8)
PROTIME: 21.8 s — ABNORMAL HIGH (ref 9.8–12.8)

## 2021-10-11 LAB — COMPREHENSIVE METABOLIC PANEL
ALBUMIN: 2.5 g/dL — ABNORMAL LOW (ref 3.4–5.0)
ALKALINE PHOSPHATASE: 161 U/L — ABNORMAL HIGH (ref 46–116)
ALT (SGPT): 26 U/L (ref 10–49)
ANION GAP: 8 mmol/L (ref 5–14)
AST (SGOT): 56 U/L — ABNORMAL HIGH (ref <=34)
BILIRUBIN TOTAL: 7.1 mg/dL — ABNORMAL HIGH (ref 0.3–1.2)
BLOOD UREA NITROGEN: 8 mg/dL — ABNORMAL LOW (ref 9–23)
BUN / CREAT RATIO: 7
CALCIUM: 9.4 mg/dL (ref 8.7–10.4)
CHLORIDE: 104 mmol/L (ref 98–107)
CO2: 24 mmol/L (ref 20.0–31.0)
CREATININE: 1.21 mg/dL — ABNORMAL HIGH
EGFR CKD-EPI (2021) MALE: 68 mL/min/{1.73_m2} (ref >=60)
GLUCOSE RANDOM: 104 mg/dL (ref 70–179)
POTASSIUM: 3.9 mmol/L (ref 3.4–4.8)
PROTEIN TOTAL: 7.1 g/dL (ref 5.7–8.2)
SODIUM: 136 mmol/L (ref 135–145)

## 2021-10-11 LAB — AMMONIA: AMMONIA: 63 umol/L — ABNORMAL HIGH (ref 11–32)

## 2021-10-12 ENCOUNTER — Ambulatory Visit: Admit: 2021-10-12 | Discharge: 2021-10-14 | Disposition: A | Discharge status: Home / Self Care | Payer: MEDICARE

## 2021-10-12 LAB — COMPREHENSIVE METABOLIC PANEL
ALBUMIN: 2.1 g/dL — ABNORMAL LOW (ref 3.4–5.0)
ALKALINE PHOSPHATASE: 120 U/L — ABNORMAL HIGH (ref 46–116)
ALT (SGPT): 23 U/L (ref 10–49)
ANION GAP: 10 mmol/L (ref 5–14)
AST (SGOT): 45 U/L — ABNORMAL HIGH (ref <=34)
BILIRUBIN TOTAL: 5.8 mg/dL — ABNORMAL HIGH (ref 0.3–1.2)
BLOOD UREA NITROGEN: 8 mg/dL — ABNORMAL LOW (ref 9–23)
BUN / CREAT RATIO: 7
CALCIUM: 8.9 mg/dL (ref 8.7–10.4)
CHLORIDE: 106 mmol/L (ref 98–107)
CO2: 21 mmol/L (ref 20.0–31.0)
CREATININE: 1.16 mg/dL — ABNORMAL HIGH
EGFR CKD-EPI (2021) MALE: 72 mL/min/{1.73_m2} (ref >=60)
GLUCOSE RANDOM: 89 mg/dL (ref 70–179)
POTASSIUM: 3.8 mmol/L (ref 3.4–4.8)
PROTEIN TOTAL: 6.1 g/dL (ref 5.7–8.2)
SODIUM: 137 mmol/L (ref 135–145)

## 2021-10-12 LAB — CBC W/ AUTO DIFF
BASOPHILS ABSOLUTE COUNT: 0 10*9/L (ref 0.0–0.1)
BASOPHILS RELATIVE PERCENT: 0.9 %
EOSINOPHILS ABSOLUTE COUNT: 0.2 10*9/L (ref 0.0–0.5)
EOSINOPHILS RELATIVE PERCENT: 3.5 %
HEMATOCRIT: 30.7 % — ABNORMAL LOW (ref 39.0–48.0)
HEMOGLOBIN: 10.9 g/dL — ABNORMAL LOW (ref 12.9–16.5)
LYMPHOCYTES ABSOLUTE COUNT: 1.7 10*9/L (ref 1.1–3.6)
LYMPHOCYTES RELATIVE PERCENT: 31.8 %
MEAN CORPUSCULAR HEMOGLOBIN CONC: 35.7 g/dL (ref 32.0–36.0)
MEAN CORPUSCULAR HEMOGLOBIN: 34.3 pg — ABNORMAL HIGH (ref 25.9–32.4)
MEAN CORPUSCULAR VOLUME: 96.2 fL — ABNORMAL HIGH (ref 77.6–95.7)
MEAN PLATELET VOLUME: 10.5 fL (ref 6.8–10.7)
MONOCYTES ABSOLUTE COUNT: 0.8 10*9/L (ref 0.3–0.8)
MONOCYTES RELATIVE PERCENT: 15.2 %
NEUTROPHILS ABSOLUTE COUNT: 2.6 10*9/L (ref 1.8–7.8)
NEUTROPHILS RELATIVE PERCENT: 48.6 %
PLATELET COUNT: 71 10*9/L — ABNORMAL LOW (ref 150–450)
RED BLOOD CELL COUNT: 3.19 10*12/L — ABNORMAL LOW (ref 4.26–5.60)
RED CELL DISTRIBUTION WIDTH: 16.1 % — ABNORMAL HIGH (ref 12.2–15.2)
WBC ADJUSTED: 5.3 10*9/L (ref 3.6–11.2)

## 2021-10-12 LAB — PHOSPHORUS: PHOSPHORUS: 4.6 mg/dL (ref 2.4–5.1)

## 2021-10-12 LAB — PROTIME-INR
INR: 1.96
PROTIME: 22.8 s — ABNORMAL HIGH (ref 9.8–12.8)

## 2021-10-12 LAB — MAGNESIUM: MAGNESIUM: 1.6 mg/dL (ref 1.6–2.6)

## 2021-10-12 MED ADMIN — rifAXIMin (XIFAXAN) tablet 550 mg: 550 mg | ORAL | @ 15:00:00 | Stop: 2022-10-12

## 2021-10-12 MED ADMIN — octreotide (SandoSTATIN) injection 100 mcg: 100 ug | INTRAVENOUS | @ 11:00:00 | Stop: 2021-10-12

## 2021-10-12 MED ADMIN — lisinopriL (PRINIVIL,ZESTRIL) tablet 2.5 mg: 2.5 mg | ORAL | @ 15:00:00

## 2021-10-12 MED ADMIN — octreotide 500 mcg in sodium chloride 0.9 % 100 mL (5 mcg/mL) infusion: 50 ug/h | INTRAVENOUS | @ 11:00:00 | Stop: 2021-10-12

## 2021-10-12 MED ADMIN — cefTRIAXone (ROCEPHIN) 2 g in sodium chloride 0.9 % (NS) 100 mL IVPB-connector bag: 2 g | INTRAVENOUS | @ 03:00:00 | Stop: 2021-10-11

## 2021-10-12 MED ADMIN — atorvastatin (LIPITOR) tablet 10 mg: 10 mg | ORAL | @ 09:00:00

## 2021-10-12 MED ADMIN — insulin lispro (HumaLOG) injection 0-20 Units: 0-20 [IU] | SUBCUTANEOUS | @ 23:00:00

## 2021-10-12 MED ADMIN — pantoprazole (PROTONIX) 80 mg in sodium chloride (NS) 0.9 % 100 mL IVPB: 80 mg | INTRAVENOUS | @ 10:00:00 | Stop: 2021-10-12

## 2021-10-12 MED ADMIN — gabapentin (NEURONTIN) capsule 400 mg: 400 mg | ORAL | @ 09:00:00 | Stop: 2021-10-12

## 2021-10-12 MED ADMIN — acetaminophen (TYLENOL) tablet 500 mg: 500 mg | ORAL | @ 08:00:00 | Stop: 2021-10-12

## 2021-10-12 MED ADMIN — enoxaparin (LOVENOX) syringe 40 mg: 40 mg | SUBCUTANEOUS | @ 15:00:00

## 2021-10-12 MED ADMIN — lactulose (CEPHULAC) packet 20 g: 20 g | ORAL | @ 15:00:00

## 2021-10-12 MED ADMIN — lactulose (CEPHULAC) packet 20 g: 20 g | ORAL | @ 19:00:00

## 2021-10-12 MED ADMIN — gabapentin (NEURONTIN) capsule 400 mg: 400 mg | ORAL | @ 15:00:00

## 2021-10-12 NOTE — Unmapped (Signed)
Updated Matthew Key Police MELD score in [place] today with the following labs:  Recertification of  MELD is 23 via [facility]    Mr. Schwinn was also noted to have the following Mild Encephalopathy and Moderate Ascites and this was noted in his MELD upgrade today.  Lab Results   Component Value Date    CREATININE 1.16 (H) 10/12/2021    NA 137 10/12/2021    BILITOT 5.8 (H) 10/12/2021    ALBUMIN 2.1 (L) 10/12/2021    INR 1.96 10/12/2021

## 2021-10-12 NOTE — Unmapped (Signed)
Pt left VM for TNC stating he was not feeling well and had vomited while out at the store earlier. Return call placed and pt sounds congested and not well. States he has had UE weakness since yesterday and had emesis again prior to call. Denies fever, +headache. Had several teeth extracted recently and was seen for follow up 2/27 with no concerns or noted pt complaints. TNC also saw pt in person 2/27 and he looked well. Pt  will have his son drive him to [facility] ER after he gets off work [employer]. On-call and primary hepatologist made aware. Pt is actively listed for liver transplant and aware to contact on-call coordinator if needed overnight.

## 2021-10-12 NOTE — Unmapped (Signed)
Received sign out from previous provider.    ?? Patient Summary: Matthew Key is a 61 y.o. male presenting with abdominal pain and emesis.  ?? Visit Notes  ?? Previous team concern for SBP.  Started patient on 2 g Rocephin.    Updates  ED Course as of 10/12/21 0106   Thu Oct 11, 2021   2337 Ammonia(!): 63   2343 PT(!): 21.8   Fri Oct 12, 2021   0051 CT Head Wo Contrast  IMPRESSION:  --No acute intracranial abnormality.  ??  --Similar appearance of prominent ventricles compared to the sulci. Findings are nonspecific but can be seen with NPH.   0105 Given patient's normal CT head.  We will plan for admission for likely SBP.

## 2021-10-12 NOTE — Unmapped (Addendum)
Matthew Key is a 61 y.o. 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    Hepatic encephalopathy (resolved) - Acute decompensated cryptogenic cirrhosis   History of ascites, SBP (08/2021), HE, and nonbleeding esophageal varices (last EGD 10/2020). He is actively listed for liver transplant. Adherent with ciprofloxacin PPx. Initial presentation of HE but now back to baseline and infectious work-up negative thus far (UA neg, 3/3 CT head neg, no asterixis on exam). Discontinued empiric SBP treatment CTX x 1 and resumed on home Cipro ppx. N/V and abdominal pain resolved. No urgent need for EGD per Hepatology. Will follow-up with Dr. Waynetta Sandy 4/17.  - Hepatology following, appreciate recs  - Hold home Lasix and spironolactone in the setting of potential SBP  - Continue home lactulose, rifaximin  - Resumed home Cipro ppx.   - 3/3 BCx NGTD x 1, tentative discharge 3/5 if NGTD x 48 hrs and HDS    Pink Tinged Hematemesis (resolved) - Gr1 varices  Hx gr1 varices seen on EGD 10/2020. Propranolol was stopped when he developed hyponatremia and fluid issues. No bloody stools or melena. Unlikely active GI bleed, EGD not indicated. OK with stopping octreotide and ceftriaxone. Will start beta blocker variceal ppx.  - No indication for EGD   - Start Coreg 3.125 BID  - F/U with Dr. Waynetta Sandy 4/17    AKI  Cr 1.3 from baseline 1.15. Encourage PO intake  - Continue to monitor    Thrombocytopenia: PLT 68, continue to monitor and will discontinue LVX is PLT <50     Anemia: Hgb 11.1, near baseline      Elev LFTs: Likely in setting or cirrhosis. AFT 56, ALP 161     T2DM  Followed by Endocrinology. HbA1c at goal.  - Decreased home Lantus 40U daily to Lantus 20U daily iso NPO, SSI  - Resume semaglutide as outpatient     Chronic medical conditions:  HTN: Continue home lisinopril  HLD: Continue home atorvastatin  OSA: cPAP nightly  Moderate pulmonary HTN: Diagnosed via [facility] with mean PA 46. 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 Not on steroids.   Insomnia: Trazodone continued. Atarax PRN for sleep

## 2021-10-12 NOTE — Unmapped (Signed)
Hepatology Consult Service   Initial Consultation         Assessment and Recommendations:   Matthew Key is a 61 y.o. 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 Actively listed for liver transplant, blood group O. he presented to [facility] with nausea, vomiting, AMS. Pt is seen in consultation at the request of Mauri Brooklyn, MD ([facility] ([facility])) for cirrhosis with hepatic encephalopathy, nausea and vomiting.    Altered mental status: suspect AMS 2/2 hepatic encephalopathy. No clear trigger, although may have missed some lactulose doses d/t vomiting. It is now resolved, and he is alert and oriented today. Blood cultures pending, no ascites fluid so cannot do diagnostic para. UA not c/f UTI. No c/f GI bleed at this time. Continue with lactulose titrated to 3-5 bowel movements, and xifaxan 550 mg BID.   --lactulose titrated to 3-5 bowel movements/day  --xifaxan 550 mg BID  --f/u on blood cultures  --Patient does have splenorenal shunt on imaging (CT scan from 08/25/2021). 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 He is not currently on betablocker. Propranolol was stopped when he developed hyponatremia and fluid issues. He did report some blood tinged emesis, which has now resolved. No reports of bloody stools or melena. Do not think he needs EGD urgently, and do not think he is having GI bleed. OK with stopping octreotide and ceftriaxone. Will discuss with her outpatient hepatologist repeating EGD versus re-starting betablocker.  --no indication for EGD   --can stop octreotide/ceftriaxone    Nausea/vomiting: unclear source of nausea and vomiting. No ascites fluid amenable to tap, and improving currently. He states he is hungry and wants to eat. No intervention at this time, CTM    I have??discussed this patient's care with??Dr. Daivd Council from the [facility] service    Thank you for involving [place] in the care of your patient. We will continue to follow along with you.    For questions, please contact the on-call fellow for the Hepatology Consult Service at [phone].     Reason for Consultation:   Pt was seen in consultation at the request of Mauri Brooklyn, MD ([facility] ([facility])) for cirrhosis with ascites, nausea and vomiting.    Subjective:   HPI:  Matthew Key is a 61 year old male with h/o cryptogenic cirrhosis, ? NASH. Cirrhosis c/b ascites, SBP (08/2021, on cipro prophylaxis), HE. Listed for transplant, blood group O. Recent hospitalizations in January for SBP and in February for general malaise and HE. Developed nausea and vomiting on Tuesday, and worsening mental status so was instructed to come to ED for assessment.    Matthew Key is alert and oriented X 4 today. He is able to give a good history. He says he started having nausea with some vomiting on Tuesday. He did have some episodes where the emesis was blood tinged. Last episode of vomiting was early this AM. No bloody stools, no tarry stools. No diarrhea.     Objective:   Physical Exam:  Temp:  [36.6 ??C (97.9 ??F)-36.8 ??C (98.2 ??F)] 36.6 ??C (97.9 ??F)  Heart Rate:  [75-104] 89  SpO2 Pulse:  [76-96] 89  Resp:  [15-25] 18  BP: (122-150)/(48-73) 145/65  SpO2:  [94 %-100 %] 100 %  Gen: WDWN male in NAD, answers questions appropriately  Abdomen: Soft, NTND, no rebound/guarding, no hepatosplenomegaly  Extremities: No edema in the BLEs    Pertinent Labs/Studies Reviewed:  CBC, CMP, INR

## 2021-10-12 NOTE — Unmapped (Signed)
Limited Abdominal Ultrasound (CPT [id number])    Indication: Fluid search for paracentesis    Findings:  RUQ: Incidentally noted right pleural effusion      RLQ:       LUQ:      LLQ:      Suprapubic:      Impression: Free fluid absent. No appropriate site seen for sampling. Incidentally noted small right pleural effusion.    Recommendation: No paracentesis    I personally performed the ultrasound and interpretation at the bedside.  Jerel Shepherd  October 12, 2021 9:42 AM

## 2021-10-12 NOTE — Unmapped (Signed)
Pt on the liver transplant list C/C of abd pain, loose bowel movements, and N/V for since Tuesday.

## 2021-10-12 NOTE — Unmapped (Signed)
Medicine Procedure Service - New Procedure Request Triage    Procedure Requested: diagnostic paracentesis  Indication: Ascites on NASH cirrhosis  Urgency: Within next 24 hours    Questions for all procedures    1. Is the patient able to give consent for this procedure? - Yes  2. If no, has family to give consent been identified? - NA  3. Safe to give peri-procedural anxiolysis? (i.e, lorazepam 1 mg IV; if so, team or M can order) - Yes  4. Has team placed med procedure consult order? (If not, please remind team to do so) - Yes  5. Is patient currently on anticoagulation? No     INR   Date Value Ref Range Status   10/12/2021 1.96  Final   09/26/2021 1.6 (H) 0.9 - 1.2 Final     Comment:     Reference interval is for non-anticoagulated patients.  Suggested INR therapeutic range for Vitamin K  antagonist therapy:     Standard Dose (moderate intensity                    therapeutic range):       2.0 - 3.0     Higher intensity therapeutic range       2.5 - 3.5       PT   Date Value Ref Range Status   10/12/2021 22.8 (H) 9.8 - 12.8 sec Final     APTT   Date Value Ref Range Status   08/25/2021 39.2 (H) 25.1 - 36.5 sec Final     Platelet   Date Value Ref Range Status   10/12/2021 71 (L) 150 - 450 10*9/L Final   07/09/2021 57 (LL) 150 - 450 x10E3/uL Final         Diagnostic Paracentesis    Has team ordered studies? Yes  Are any studies other than routine rule-out-SBP labs needed (such as cytology or AFB culture)? No

## 2021-10-12 NOTE — Unmapped (Addendum)
Internal Medicine (MEDU) Progress Note    Assessment & Plan:   Matthew Key is a 61 y.o. 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   ??  Active Problems:    Non-alcoholic micronodular cirrhosis of liver (CMS-HCC)    Primary insomnia    OSA on CPAP    Type 2 diabetes mellitus without complication (CMS-HCC)    Abdominal ascites    Essential hypertension    Esophageal varices determined by endoscopy (CMS-HCC)    Gastroesophageal reflux disease    History of stroke without residual deficits    Hyperlipidemia    Obesity (BMI 30-39.9)    Hypertension associated with diabetes (CMS-HCC)  Resolved Problems:    * No resolved hospital problems. *    C/f SBP - Hepatic encephalopathy - Acute decompensated cryptogenic cirrhosis - Hematemesis  History of ascites, SBP (08/2021), HE, and nonbleeding esophageal varices (last EGD 10/2020). He is actively listed for liver transplant. He is adherent with ciprofloxacin PPx. No tappable pocket for diagnostic paracentesis. Being treated empirically for SBP. Reported symptoms are c/w mild HE; no asterixis on exam. Shortly after admission, had 2 episodes of hematemesis, one noticed by MD to be pink-tinged. Octreotide x 1 was ordered and hepatology consulted. Due to confusion, will continue infectious work up - UA unremarkable, will order Bcx. CXR neg for pneumonia. 3/3 CT head neg for neuro causes of confusion. Has previously been on beta blocker but discontinued d/t fluid issues and hyponatremia. Consulted Med M, no pocket for diagnostic paracentesis.   - Hepatology following, appreciate recs  - No urgent need for EGD, resume diet  - Hold home Lasix and spironolactone in the setting of potential SBP  - Continue home lactulose; titrate to goal 3-5 BMs/day  - Continue home rifaximin  - Discontinue Empiric ceftriaxone (3/3)  - Hold home ciprofloxacin while on ceftriaxone. Resume home cipro in the AM.  - Investigate need for beta blocker for variceal prophylaxis  - F/U BCx    Blood tinged emesis in patient with h/o Gr1 varices  Hx gr1 varices seen on EGD 10/2020. He is not currently on betablocker. Propranolol was stopped when he developed hyponatremia and fluid issues. He did report some blood tinged emesis, which has now resolved. No reports of bloody stools or melena. Do not think he needs EGD urgently, and do not think he is having GI bleed. OK with stopping octreotide and ceftriaxone. Will discuss with her outpatient hepatologist repeating EGD versus re-starting betablocker.  - No indication for EGD   - Can stop octreotide/ceftriaxone    Thrombocytopenia: PLT 68, continue to monitor and will discontinue LVX is PLT <50    Anemia: Hgb 11.1, near baseline     Elev LFTs: Likely in setting or cirrhosis. AFT 56, ALP 161  ??  T2DM  Followed by Endocrinology. HbA1c at goal. Will hold lantus 3/3 since NPO.   - Decreased home Lantus 40U daily to Lantus 20U daily. SSI. Hold when NPO.   - Resume semaglutide as outpatient.  ??  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 Not on steroids.   Insomnia: Trazodone resumed. Atarax PRN for sleep  ??  Daily Checklist:  Diet: Regular diet  DVT PPx: Lovenox 40mg  q24h  Electrolytes: Replete Potassium to >/= 3.6 and Magnesium to >/= 1.8  Code Status: Full Code  Dispo: TBD    Team Contact Information:   Primary Team: Internal Medicine (MEDU)  Primary Resident: Estelle June, MD  Resident's Pager: [phone] (Gen [facility] Intern - Cyndee Brightly)

## 2021-10-12 NOTE — Unmapped (Shared)
[facility]  Emergency Department Medical Screening Examination     Subjective     Matthew Key is a 61 y.o. male presenting for evaluation of Abdominal Pain, Emesis, and Diarrhea. The patient reports 2 days of vomiting, diffuse lower abdominal pain, and incontinence. Per son, he seems a little confused. He is on the liver transplant list. He denies chest pain or fever.      Abbreviated Review of Systems/Covid Screen  Constitutional: Negative for fever  Respiratory: Negative for cough. Negative for difficulty breathing.    Objective     ED Triage Vitals   Enc Vitals Group      BP 10/11/21 2002 122/50      Heart Rate 10/11/21 1955 104      SpO2 Pulse --       Resp 10/11/21 1955 16      Temp --       Temp src --       SpO2 10/11/21 1955 98 %     Focused Physical Exam  Constitutional: No acute distress.  Respiratory: Non-labored respirations.  Neurological: Clear speech. No gross focal neurologic deficits are appreciated.  ?  Assessment & Plan   Appropriate triage orders placed. Further workup and assessment will be conducted after bed placement in the main ED.     A medical screening exam has been performed. At the time of this evaluation, no emergency medical condition requiring immediate stabilization has been identified nor is there suspicion for imminent decompensation. Appropriate triage protocols will be implemented and a comprehensive ED evaluation with disposition will be completed by a healthcare provider when an appropriate ED location becomes available. The patient is aware that this is an initial encounter only and verbalizes understanding and agreement with the plan.     Emergency Department operations continue to be impacted by the COVID-19 pandemic.     Matthew Key  October 11, 2021 8:02 PM    Attestations     Documentation assistance was provided by Mohammed Kindle, Scribe on October 11, 2021 at 8:02 PM for Army Melia, MD.     {*** NOTE TO PROVIDER: PLEASE ADD EDPROVSCRIBEATTEST NOTING YOU AGREE WITH SCRIBE DOCUMENTATION ***}

## 2021-10-12 NOTE — Unmapped (Signed)
Care Management  Initial Transition Planning Assessment              General  Care Manager assessed the patient by : In person interview with patient  Orientation Level: Oriented X4  Functional level prior to admission: Independent  Who provides care at home?: N/A  Reason for referral: Discharge Planning    Contact/Decision Maker  Extended Emergency Contact Information  Primary Emergency Contact: Amer,Sherry  Mobile Phone: [phone]  Relation: Spouse  Secondary Emergency Contact: Boxell,Davell B  Mobile Phone: [phone]  Relation: Son    [employer]ed forces operational officer Next of Kin / Guardian / POA / Advance Directives     HCDM (patient stated preference): Fronek,Sherry - Spouse - [phone]    Advance Directive (Medical Treatment)  Does patient have an advance directive covering medical treatment?: Patient does not have advance directive covering medical treatment.    Health Care Decision Maker [HCDM] (Medical & Mental Health Treatment)  Healthcare Decision Maker: HCDM documented in the HCDM/Contact Info section.      Readmission Information    Have you been hospitalized in the last 30 days?: No     Were you being cared for at a skilled nursing facility:: No        Patient Information  Lives with: Spouse/significant other    Type of Residence: Private residence     Support Systems/Concerns: Significant Other    Responsibilities/Dependents at home?: No    Home Care services in place prior to admission?: No     Equipment Currently Used at Home: cane, straight, shower chair    Currently receiving outpatient dialysis?: No     Financial Information    Need for financial assistance?: No      Social Determinants of Health  Social Determinants of Health were addressed in provider documentation.  Please refer to patient history.    Complex Discharge Information    Interventions:     Discharge Needs Assessment  Concerns to be Addressed: no discharge needs identified    Clinical Risk Factors: Multiple Diagnoses (Chronic)    Barriers to taking medications: No    Prior overnight hospital stay or ED visit in last 90 days: Yes       Equipment Needed After Discharge: none    Discharge Facility/Level of Care Needs:      Readmission  Risk of Unplanned Readmission Score: UNPLANNED READMISSION SCORE: 30.29%  Predictive Model Details          30% (High)  Factor Value    Calculated 10/12/2021 12:05 21% Number of active Rx orders 40    Elsie Risk of Unplanned Readmission Model 17% Number of ED visits in last six months 4     14% Number of hospitalizations in last year 4     7% ECG/EKG order present in last 6 months     7% Charlson Comorbidity Index 8     6% Diagnosis of electrolyte disorder present     5% Imaging order present in last 6 months     4% Latest hemoglobin low (10.9 g/dL)     4% Phosphorous result present     4% Age 61     3% Diagnosis of deficiency anemia present     3% Active anticoagulant Rx order present     3% Latest creatinine high (1.16 mg/dL)     1% Future appointment scheduled     1% Active ulcer medication Rx order present     0% Current length of stay 0.018 days  Readmitted Within the Last 30 Days? (No if blank)   Patient at risk for readmission?: Yes    Discharge Plan  Screen findings are: Care Manager reviewed the plan of the patient's care with the Multidisciplinary Team. No discharge planning needs identified at this time. Care Manager will continue to manage plan and monitor patient's progress with the team.    Expected Discharge Date:     Expected Transfer from Critical Care:       Patient and/or family were provided with choice of facilities / services that are available and appropriate to meet post hospital care needs?: N/A    Initial Assessment complete?: Yes

## 2021-10-12 NOTE — Unmapped (Signed)
[facility]  Emergency Department Progress Note    October 11, 2021 11:59 PM    I supervised care provided by the resident/APP. We have discussed the case, I have reviewed the note and I agree with the plan of treatment.    Recent VS:  Recent Patient Data       10/11/2021  1955 10/11/2021  2002 10/11/2021  2127       BP: -- 122/50 141/73     Pulse: 104 98 86     Temp: -- 36.8 ??C (98.2 ??F) --     Resp: 16 20 18      SpO2: 98 % 100 % 97 %         Matthew Key is a 61yo man with a past medical history of Elita Boone cirrhosis currently on liver transplant list who presents for evaluation of abdominal pain and loose bowel movements as well as nausea vomiting.  Nonbloody nonbilious emesis.  No blood in stools.  No fever. Differential is pancreatitis, pyelonephritis, SBP (no tapable fluid pocket), HE. Labs thus far without significant abnormality. Awaiting CT head for confusion.      ED Clinical Impression       Final diagnoses:   Abdominal pain, unspecified abdominal location (Primary)   Nausea vomiting and diarrhea

## 2021-10-12 NOTE — Unmapped (Signed)
Internal Medicine (MEDU) History & Physical    Assessment & Plan:   Matthew Key is a 61 y.o. 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    Active Problems:    Non-alcoholic micronodular cirrhosis of liver (CMS-HCC)    Primary insomnia    OSA on CPAP    Type 2 diabetes mellitus without complication (CMS-HCC)    Abdominal ascites    Essential hypertension    Esophageal varices determined by endoscopy (CMS-HCC)    Gastroesophageal reflux disease    History of stroke without residual deficits    Hyperlipidemia    Obesity (BMI 30-39.9)    Hypertension associated with diabetes (CMS-HCC)  Resolved Problems:    * No resolved hospital problems. *    C/f SBP  Hepatic encephalopathy  Acute decompensated cryptogenic cirrhosis  History of ascites, SBP (08/2021), HE, and nonbleeding esophageal varices (last EGD 10/2020). He is actively listed for liver transplant. He is adherent with ciprofloxacin PPx. No tappable pocket for diagnostic paracentesis. Being treated empirically for SBP. Reported symptoms are c/w mild HE; no asterixis on exam.   MELD-Na score: 24 at 10/11/2021 11:11 PM  MELD score: 23 at 10/11/2021 11:11 PM  Calculated from:  Serum Creatinine: 1.21 mg/dL at 08/17/1094  0:45 PM  Serum Sodium: 136 mmol/L at 10/11/2021  9:23 PM  Total Bilirubin: 7.1 mg/dL at 4/0/9811  9:14 PM  INR(ratio): 1.88 at 10/11/2021 11:11 PM  Age: 73 years   - Consider Hepatology consult  - Trend MELD labs  - Hold home Lasix and spironolactone in the setting of potential SBP  - Continue home lactulose; titrate to goal 3-5 BMs/day  - Continue home rifaximin  - Empiric ceftriaxone (3/3 - )  - Hold home ciprofloxacin while on ceftriaxone  - Investigate need for beta blocker for variceal prophylaxis  - Sodium-restricted diet  - Outpatient follow-up with Hepatology, Transplant Nutritionist and Transplant Psychologist    T2DM  Followed by Endocrinology. HbA1c at goal.  - Continue home Lantus 40U daily  - SSI  - Resume semaglutide as outpatient    Chronic medical conditions:  HTN: Continue home lisinopril.  HLD: Continue home atorvastatin.  OSA: cPAP nightly.  Moderate pulmonary HTN: Diagnosed via RHC with mean PA 46. ?Group 3 from OSA.  GERD - H/o duodenal ulcer: Continue PPI.  Degenerative disc disease: Continue nightly gabapentin.  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 Not on steroids.   Insomnia: Trazodone discontinued. Atarax PRN for sleep.    Daily Checklist:  Diet: Sodium Restricted (2g)  DVT PPx: Lovenox 40mg  q24h  Electrolytes: Replete Potassium to >/= 3.6 and Magnesium to >/= 1.8  Code Status: Full Code    Chief Concern:   Nausea, vomiting    Subjective:   HPI:  Matthew Key is a 61 y.o. 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    He has been vomiting NBNB emesis for 2 days. He reports 1 day of abdominal pain, localized from the umbilicus to RLQ. He rates it as 7/10. This started today after vomiting. He also reports decreased appetite and PO intake.  He has not been sleeping. He discontinued trazodone per Hepatology. He also reports bilateral arm/hand weakness or jerking/flapping. This feeling comes and goes depending on the week. He also reports feeling somewhat unsteady while walking this morning. His wife tells him he is more confused. He feels like his thinking is slower than usual.  He has been taking lactulose 3 times per day. He has been having 3-4 BMs per day.    He reports fatigue and dyspnea on exertion (with walking). He sometimes has a sensation of gas pain or fluttering at the bottom of his chest. He denies fever, chills, chest pressure, dysuria.    He called his transplant coordinator regarding his vomiting and abdominal pain, who recommended that he come to the ED.    In the ED, vitals were normal. Labs notable for Hgb 11.1, Plt 68, Cr 1.21, AST 56, ALP 161, ammonia 63, INR 1.88, and normal UA. Studies notable for CT head without acute intracranial abnormality. COVID negative. There was not an ascitic pocket amenable to paracentesis. He received ceftriaxone. he was then admitted to medicine.    Of note, he was recently hospitalized 1/14-20/2023 for SBP treated with ceftriaxone. He was discharged on ciprofloxacin prophylaxis. He presented to outside ED 2/10 for malaise and was found to have hyperammonemia; lactulose was increased. He has not required LVP recently. He is actively listed for liver transplant. He recently had teeth pulled in preparation for the transplant.     Designated Environmental health practitioner:  [name] currently has decisional capacity for healthcare decision-making and is able to designate a surrogate healthcare decision maker. [name] designated healthcare decision maker(s) is/are Matthew Key (the patient's spouse) as denoted by stated patient preference.    Advance Care Planning   Participants: [name], MD  Discussion/Action: Discussed HCDM. He appoints his wife, but states that she is a TEFL teacher witness and that he would not want her to refuse blood products for him. He states he would like to be Full Code, but he would not want to be on prolonged life support because he would not want to be a burden on his family. He states if he were going to be unable to have a daily routine or do things with his grandchildren, he would not want to remain on life support.     Health Care Decision Maker as of 10/12/2021    HCDM (patient stated preference): Matthew Key - Spouse - [phone]    Allergies:  Venom-honey bee    Medications:   Prior to Admission medications    Medication Dose, Route, Frequency   acetaminophen (TYLENOL) 325 MG tablet 650 mg, Oral, Every 6 hours PRN   atorvastatin (LIPITOR) 10 MG tablet 10 mg, Oral, Nightly   ciprofloxacin HCl (CIPRO) 500 MG tablet TAKE 1 TABLET BY MOUTH IN THE MORNING   CONSTULOSE 10 gram/15 mL solution TAKE BY MOUTH TWICE DAILY, ENSURE 3-4 SOFT BOWEL MOVEMENT DAILY.   furosemide (LASIX) 20 MG tablet 40 mg, Oral, Daily (standard)   gabapentin (NEURONTIN) 400 MG capsule 400 mg, Oral, 2 times a day   insulin degludec (TRESIBA FLEXTOUCH U-100) 100 unit/mL (3 mL) InPn 40 Units, Subcutaneous, At bedtime, Adjust as instructed.  Patient taking differently: Inject 40 Units under the skin daily. Adjust as instructed.   lisinopriL (PRINIVIL,ZESTRIL) 2.5 MG tablet 2.5 mg, Oral, Daily (standard)   pantoprazole (PROTONIX) 40 MG tablet 40 mg, Oral, Daily (standard)   rifAXIMin (XIFAXAN) 550 mg Tab 550 mg, Oral, 2 times a  day (standard)   semaglutide (OZEMPIC) 1 mg/dose (4 mg/3 mL) PnIj injection 1 mg, Subcutaneous, Every 7 days  Patient taking differently: Inject 1 mg under the skin every seven (7) days. On Sunday   spironolactone (ALDACTONE) 25 MG tablet 50 mg, Oral, Daily (standard)   BINAXNOW COVID-19 AG SELF TEST Kit Use as Directed on the Package   flash glucose sensor kit 1 each, Other, Every 14 days, Insert 1 sensor SQ q14 days; give #2 (30 day supply) or #6 (90 day supply) per pt preference   hydrOXYzine (VISTARIL) 50 MG capsule No dose, route, or frequency recorded.   insulin syringe-needle U-100 1 mL 31 gauge x 5/16 (8 mm) Syrg Use once as needed for Hydrocortisone injection..   ondansetron (ZOFRAN-ODT) 4 MG disintegrating tablet TAKE 1 TABLET BY MOUTH EVERY 6 HOURS FOR 7 DAYS   pen needle, diabetic (BD ULTRA-FINE NANO PEN NEEDLE) 32 gauge x 5/32 (4 mm) Ndle ok to sub any brand or size needle preferred by insurance/patient, use 1-2x/day, dx E11.65   polyethylene glycol (MIRALAX) 17 gram packet 17 g, Oral, Daily (standard)   sodium chloride (AYR) 0.65 % Drop 1 spray   syringe with needle 3 mL 23 x 1 Syrg Use as needed for Hydrocortisone injection       Medical History:  Past Medical History:   Diagnosis Date   ??? AKI (acute kidney injury) (CMS-HCC) 12/14/2020   ??? Anxiety 10/22/2013   ??? Arthritis    ??? Cervical radiculopathy 12/03/2016   ??? Chronic pain disorder     Lower back   ??? Cirrhosis (CMS-HCC)    ??? Dental abscess 10/2020   ??? Duodenal ulcer 12/01/2017   ??? GERD (gastroesophageal reflux disease)    ??? History of transfusion    ??? Hyperlipidemia 10/22/2013   ??? Hypertension     under control with meds and weight loss   ??? Liver disease    ??? Sleep apnea, obstructive     Have machine   ??? Stroke (CMS-HCC)     mild stroke   ??? Type 2 diabetes mellitus with diabetic neuropathy, with long-term current use of insulin (CMS-HCC) 06/09/2014       Surgical History:  Past Surgical History:   Procedure Laterality Date   ??? KNEE SURGERY     ??? PR CATH PLACE/CORON ANGIO, IMG SUPER/INTERP,R&L HRT CATH, L HRT VENTRIC N/A 12/19/2020    Procedure: CATH LEFT/RIGHT HEART CATHETERIZATION;  Surgeon: Rosana Hoes, MD;  Location: [facility] CATH;  Service: Cardiology   ??? PR UPPER GI ENDOSCOPY,BIOPSY N/A 10/12/2020    Procedure: UGI ENDOSCOPY; WITH BIOPSY, SINGLE OR MULTIPLE;  Surgeon: Marene Lenz, MD;  Location: [facility];  Service: Gastroenterology   ??? ROOT CANAL      Front teeth       Family History:  Family History   Problem Relation Age of Onset   ??? Edema Mother    ??? Alzheimer's disease Father    ??? Aortic dissection Brother    ??? Early death Brother    ??? Aneurysm Brother        Social History:  The patient lives with family    Social History     Tobacco Use   ??? Smoking status: Never   ??? Smokeless tobacco: Never   Substance Use Topics   ??? Alcohol use: Not Currently     Comment: light drinker. No drink in over a year .   ??? Drug use: Never  Review of Systems:  10 systems were reviewed and are negative unless otherwise mentioned in the HPI    Objective:   Physical Exam:  Temp:  [36.8 ??C (98.2 ??F)] 36.8 ??C (98.2 ??F)  Heart Rate:  [75-104] 75  SpO2 Pulse:  [76-96] 76  Resp:  [15-20] 18  BP: (122-144)/(48-73) 125/48  SpO2:  [96 %-100 %] 96 %    Gen: Alert, awake, chronically-ill-appearing male in NAD.  HEENT: NCAT. Sclerae icteric, conjunctivae clear. MMM. Oropharynx clear.  Neck: Supple. Trachea midline.  Pulm: CTAB. No crackles, wheezes, or rhonchi. Normal WOB.  CV: RRR, no MRG. Distal pulses 2+.  Abd: Soft, mildly distended, NT, BS+.  Ext: Well-perfused. No edema.  Skin: Warm, dry, intact. Jaundiced.  Neuro: AAOx3. Normal speech and language. No gross deficits. No asterixis.     Labs/Studies/Imaging:  Labs, Studies, Imaging from the last 24hrs per EMR and personally reviewed

## 2021-10-12 NOTE — Unmapped (Signed)
[facility]  Emergency Department Provider Note     ED Clinical Impression     Final diagnoses:   None        Impression, Medical Decision Making, ED Course     Impression: 61 y.o. male who has a past medical history of AKI (acute kidney injury) (CMS-HCC) (12/14/2020), Anxiety (10/22/2013), Arthritis, Cervical radiculopathy (12/03/2016), Chronic pain disorder, Cirrhosis (CMS-HCC), Dental abscess (10/2020), Duodenal ulcer (12/01/2017), GERD (gastroesophageal reflux disease), History of transfusion, Hyperlipidemia (10/22/2013), Hypertension, Liver disease, Sleep apnea, obstructive, Stroke (CMS-HCC), and Type 2 diabetes mellitus with diabetic neuropathy, with long-term current use of insulin (CMS-HCC) (06/09/2014). who presents with abdominal pain as described below.     DDx/MDM: Given the broad differential of abdominal pain, will obtain basic labs.  Lipase to evaluate for pancreatitis.  CBC to evaluate for leukocytosis or anemia.  Chemistry to evaluate for electrolyte abnormalities, acidosis or alkalosis, and liver function test to evaluate for hepato-biliary disease.  Urinalysis to evaluate for cystitis, pyelonephritis, hematuria suggest renal colic.  We will plan for diagnostic paracentesis in the setting of patient's known cirrhosis.  Obtain head CT in the setting of patient's reported confusion, feelings of being off balance while walking.    Diagnostic workup as below.    Orders Placed This Encounter   Procedures   ??? Rapid Influenza/RSV/COVID PCR   ??? CT Head Wo Contrast   ??? CBC w/ Differential   ??? Comprehensive metabolic panel   ??? Protime-INR   ??? Urinalysis with Microscopy with Culture Reflex   ??? Ammonia       ED Course as of 10/11/21 2257   Thu Oct 11, 2021   2045 Bedside POCUS without safe tappable pocket for diagnostic paracentesis.  The setting of abdominal pain, prior SBP, will cover patient with ceftriaxone.   2248 CBC notable for creatinine 1.21 although similar to patient's baseline.  AST, alk phos, total bili elevated although similar to baseline.   2249 Patient signed out to oncoming resident at 2300.  Pending laboratory work-up, head CT.  Patient has been given ceftriaxone for possible SBP.  Anticipate likely admission once work-up is complete.       Independent Interpretation of Studies: I have independently interpreted the following studies:  ??? See ED course    Discussion of Management With Other Providers or Support Staff: I discussed the management of this patient with the:  ??? None    Considerations Regarding Disposition/Escalation of Care and Critical Care:  ??? Dispo pending at time of signout  ____________________________________________    The case was discussed with the attending physician, who is in agreement with the above assessment and plan.      History     Chief Complaint  Chief Complaint   Patient presents with   ??? Abdominal Pain   ??? Emesis   ??? Diarrhea       HPI   Matthew Key is a 61 y.o. male with past medical history as below who presents with abdominal pain.  Patient reports associated nausea with approximately 2 times nonbloody emesis per day.  Symptoms present for approximately 2 days.  Patient reports calling the liver coordinator this afternoon and being instructed to come to the ED for further evaluation.  Reports compliance with prophylactic antibiotics.  Patient has a history of SBP in the past, states that this feels somewhat similar.  Patient denies chest pain, shortness of breath, diarrhea, fever, cough, viral-like symptoms.  Family member at bedside reports that the patient's  spouse has reported some confusion at nighttime currently he has been getting up to go to the bathroom and is found in the bathroom to spending a lot of time in the area.  Family reports that the patient has been sometimes unsteady while walking.  Denies any falls or trauma.  Family reports that the patient has been complaining of some urinary incontinence, which is abnormal.    I have reviewed the discharge summary from patient's most recent hospital admission.  Patient was admitted on 08/25/2021 through 08/31/2021.  She was admitted for SBP.  Patient was treated with IV albumin and antibiotics and discharged home in stable condition with outpatient PCP and hepatology follow-up.  Note patient was admitted with abdominal pain and fever status post LVP on 1/11 in the setting of unintentional cessation of prophylactic Cipro.  Patient was restarted on ciprofloxacin for SBP prophylaxis on discharge.    Outside Historian(s): I have obtained additional history/collateral from family member at bedside.    External Records Reviewed: I have reviewed, see above.    Past Medical History:   Diagnosis Date   ??? AKI (acute kidney injury) (CMS-HCC) 12/14/2020   ??? Anxiety 10/22/2013   ??? Arthritis    ??? Cervical radiculopathy 12/03/2016   ??? Chronic pain disorder     Lower back   ??? Cirrhosis (CMS-HCC)    ??? Dental abscess 10/2020   ??? Duodenal ulcer 12/01/2017   ??? GERD (gastroesophageal reflux disease)    ??? History of transfusion    ??? Hyperlipidemia 10/22/2013   ??? Hypertension     under control with meds and weight loss   ??? Liver disease    ??? Sleep apnea, obstructive     Have machine   ??? Stroke (CMS-HCC)     mild stroke   ??? Type 2 diabetes mellitus with diabetic neuropathy, with long-term current use of insulin (CMS-HCC) 06/09/2014       Past Surgical History:   Procedure Laterality Date   ??? KNEE SURGERY     ??? PR CATH PLACE/CORON ANGIO, IMG SUPER/INTERP,R&L HRT CATH, L HRT VENTRIC N/A 12/19/2020    Procedure: CATH LEFT/RIGHT HEART CATHETERIZATION;  Surgeon: Rosana Hoes, MD;  Location: [facility] CATH;  Service: Cardiology   ??? PR UPPER GI ENDOSCOPY,BIOPSY N/A 10/12/2020    Procedure: UGI ENDOSCOPY; WITH BIOPSY, SINGLE OR MULTIPLE;  Surgeon: Marene Lenz, MD;  Location: [facility];  Service: Gastroenterology   ??? ROOT CANAL      Front teeth       No current facility-administered medications for this encounter.    Current Outpatient Medications:   ???  acetaminophen (TYLENOL) 325 MG tablet, Take 2 tablets (650 mg total) by mouth every six (6) hours as needed., Disp: , Rfl: 0  ???  acetaminophen-codeine (TYLENOL #3) 300-30 mg per tablet, TAKE 1 TABLET BY MOUTH 3 TIMES DAILY FOR 2 DAYS, Disp: , Rfl:   ???  atorvastatin (LIPITOR) 10 MG tablet, Take 10 mg by mouth nightly., Disp: , Rfl:   ???  BINAXNOW COVID-19 AG SELF TEST Kit, Use as Directed on the Package, Disp: , Rfl:   ???  ciprofloxacin HCl (CIPRO) 500 MG tablet, TAKE 1 TABLET BY MOUTH IN THE MORNING, Disp: 30 tablet, Rfl: 0  ???  CONSTULOSE 10 gram/15 mL solution, TAKE BY MOUTH TWICE DAILY, ENSURE 3-4 SOFT BOWEL MOVEMENT DAILY., Disp: , Rfl:   ???  flash glucose sensor kit, 1 each by Other route every fourteen (14) days.  Insert 1 sensor SQ q14 days; give #2 (30 day supply) or #6 (90 day supply) per pt preference, Disp: 2 each, Rfl: 5  ???  furosemide (LASIX) 20 MG tablet, Take 2 tablets (40 mg total) by mouth daily., Disp: 60 tablet, Rfl: 0  ???  gabapentin (NEURONTIN) 400 MG capsule, Take 400 mg by mouth two (2) times a day., Disp: , Rfl:   ???  hydrOXYzine (VISTARIL) 50 MG capsule, , Disp: , Rfl:   ???  insulin degludec (TRESIBA FLEXTOUCH U-100) 100 unit/mL (3 mL) InPn, Inject 0.4 mL (40 Units total) under the skin at bedtime. Adjust as instructed., Disp: 15 mL, Rfl: 3  ???  insulin syringe-needle U-100 1 mL 31 gauge x 5/16 (8 mm) Syrg, Use once as needed for Hydrocortisone injection.., Disp: 10 each, Rfl: 0  ???  lisinopriL (PRINIVIL,ZESTRIL) 2.5 MG tablet, Take 2.5 mg by mouth daily., Disp: , Rfl:   ???  omeprazole (PRILOSEC) 20 MG capsule, Take 20 mg by mouth daily., Disp: , Rfl:   ???  ondansetron (ZOFRAN-ODT) 4 MG disintegrating tablet, TAKE 1 TABLET BY MOUTH EVERY 6 HOURS FOR 7 DAYS, Disp: , Rfl:   ???  pantoprazole (PROTONIX) 40 MG tablet, Take 1 tablet (40 mg total) by mouth daily., Disp: 30 tablet, Rfl: 0  ???  PAXLOVID CO-PACK, EUA, (NIRMATRELVIR-RITONAVIR) 300 mg (150 mg x 2)-100 mg tablet, SMARTSIG:By Mouth, Disp: , Rfl:   ???  pen needle, diabetic (BD ULTRA-FINE NANO PEN NEEDLE) 32 gauge x 5/32 (4 mm) Ndle, ok to sub any brand or size needle preferred by insurance/patient, use 1-2x/day, dx E11.65, Disp: 100 each, Rfl: 12  ???  polyethylene glycol (MIRALAX) 17 gram packet, Take 17 g by mouth daily., Disp: 30 packet, Rfl: 0  ???  rifAXIMin (XIFAXAN) 550 mg Tab, Take 1 tablet (550 mg total) by mouth Two (2) times a day., Disp: 60 tablet, Rfl: 5  ???  semaglutide (OZEMPIC) 1 mg/dose (4 mg/3 mL) PnIj injection, Inject 1 mg under the skin every seven (7) days., Disp: 3 mL, Rfl: 3  ???  sodium chloride (AYR) 0.65 % Drop, 1 spray., Disp: , Rfl:   ???  spironolactone (ALDACTONE) 25 MG tablet, Take 2 tablets (50 mg total) by mouth daily., Disp: 60 tablet, Rfl: 0  ???  syringe with needle 3 mL 23 x 1 Syrg, Use as needed for Hydrocortisone injection, Disp: 1 each, Rfl: 0  ???  traMADoL (ULTRAM) 50 mg tablet, , Disp: , Rfl:     Allergies  Venom-honey bee    Family History  Family History   Problem Relation Age of Onset   ??? Edema Mother    ??? Alzheimer's disease Father    ??? Aortic dissection Brother    ??? Early death Brother    ??? Aneurysm Brother        Social History  Social History     Tobacco Use   ??? Smoking status: Never   ??? Smokeless tobacco: Never   Substance Use Topics   ??? Alcohol use: Not Currently     Comment: light drinker. No drink in over a year .   ??? Drug use: Never        Physical Exam     VITAL SIGNS:      Vitals:    10/11/21 1955 10/11/21 2002 10/11/21 2127   BP:  122/50 141/73   Pulse: 104 98 86   Resp: 16 20 18    Temp:  36.8 ??C (98.2 ??F)  TempSrc:  Oral Oral   SpO2: 98% 100% 97%       Constitutional: Alert and oriented. No acute distress.  Eyes: Conjunctivae are normal.  HEENT: Normocephalic and atraumatic. Conjunctivae clear. No congestion. Moist mucous membranes.   Cardiovascular: Rate as above, regular rhythm. Normal and symmetric distal pulses. Brisk capillary refill. Normal skin turgor.  Respiratory: Normal respiratory effort. Breath sounds are normal. There are no wheezing or crackles heard.  Gastrointestinal: Soft, non-distended, with tenderness to palpation of the lower quadrants bilaterally.  Genitourinary: Deferred.  Musculoskeletal: Non-tender with normal range of motion in all extremities.  Neurologic: Normal speech and language. No gross focal neurologic deficits are appreciated. Patient is moving all extremities equally, face is symmetric at rest and with speech.  Skin: Skin is warm, dry and intact. No rash noted.  Psychiatric: Mood and affect are normal. Speech and behavior are normal.     Radiology     CT Head Wo Contrast    (Results Pending)       Pertinent labs & imaging results that were available during my care of the patient were independently interpreted by me and considered in my medical decision making (see chart for details).    Portions of this record have been created using Scientist, clinical (histocompatibility and immunogenetics). Dictation errors have been sought, but may not have been identified and corrected.         Cheryle Horsfall, MD  Resident  10/11/21 [phone]

## 2021-10-13 LAB — COMPREHENSIVE METABOLIC PANEL
ALBUMIN: 2.1 g/dL — ABNORMAL LOW (ref 3.4–5.0)
ALKALINE PHOSPHATASE: 114 U/L (ref 46–116)
ALT (SGPT): 22 U/L (ref 10–49)
ANION GAP: 7 mmol/L (ref 5–14)
AST (SGOT): 46 U/L — ABNORMAL HIGH (ref <=34)
BILIRUBIN TOTAL: 4.6 mg/dL — ABNORMAL HIGH (ref 0.3–1.2)
BLOOD UREA NITROGEN: 11 mg/dL (ref 9–23)
BUN / CREAT RATIO: 8
CALCIUM: 8.1 mg/dL — ABNORMAL LOW (ref 8.7–10.4)
CHLORIDE: 103 mmol/L (ref 98–107)
CO2: 24 mmol/L (ref 20.0–31.0)
CREATININE: 1.3 mg/dL — ABNORMAL HIGH
EGFR CKD-EPI (2021) MALE: 63 mL/min/{1.73_m2} (ref >=60)
GLUCOSE RANDOM: 302 mg/dL — ABNORMAL HIGH (ref 70–179)
POTASSIUM: 3.8 mmol/L (ref 3.4–4.8)
PROTEIN TOTAL: 5.9 g/dL (ref 5.7–8.2)
SODIUM: 134 mmol/L — ABNORMAL LOW (ref 135–145)

## 2021-10-13 LAB — PROTIME-INR
INR: 1.89
PROTIME: 21.9 s — ABNORMAL HIGH (ref 9.8–12.8)

## 2021-10-13 LAB — MAGNESIUM: MAGNESIUM: 1.6 mg/dL (ref 1.6–2.6)

## 2021-10-13 LAB — PHOSPHORUS: PHOSPHORUS: 3.2 mg/dL (ref 2.4–5.1)

## 2021-10-13 MED ADMIN — lactulose (CEPHULAC) packet 20 g: 20 g | ORAL | @ 13:00:00

## 2021-10-13 MED ADMIN — insulin lispro (HumaLOG) injection 0-20 Units: 0-20 [IU] | SUBCUTANEOUS | @ 04:00:00

## 2021-10-13 MED ADMIN — rifAXIMin (XIFAXAN) tablet 550 mg: 550 mg | ORAL | @ 13:00:00 | Stop: 2022-10-12

## 2021-10-13 MED ADMIN — enoxaparin (LOVENOX) syringe 40 mg: 40 mg | SUBCUTANEOUS | @ 13:00:00

## 2021-10-13 MED ADMIN — lactulose (CEPHULAC) packet 20 g: 20 g | ORAL | @ 19:00:00

## 2021-10-13 MED ADMIN — gabapentin (NEURONTIN) capsule 400 mg: 400 mg | ORAL | @ 02:00:00

## 2021-10-13 MED ADMIN — lactulose (CEPHULAC) packet 20 g: 20 g | ORAL | @ 02:00:00

## 2021-10-13 MED ADMIN — traZODone (DESYREL) tablet 50 mg: 50 mg | ORAL | @ 02:00:00

## 2021-10-13 MED ADMIN — pantoprazole (PROTONIX) injection 40 mg: 40 mg | INTRAVENOUS | @ 13:00:00 | Stop: 2021-10-13

## 2021-10-13 MED ADMIN — insulin glargine (LANTUS) injection 20 Units: 20 [IU] | SUBCUTANEOUS | @ 13:00:00

## 2021-10-13 MED ADMIN — ciprofloxacin HCl (CIPRO) tablet 500 mg: 500 mg | ORAL | @ 13:00:00 | Stop: 2021-10-20

## 2021-10-13 MED ADMIN — gabapentin (NEURONTIN) capsule 400 mg: 400 mg | ORAL | @ 13:00:00

## 2021-10-13 MED ADMIN — carvediloL (COREG) tablet 3.125 mg: 3.125 mg | ORAL | @ 19:00:00

## 2021-10-13 MED ADMIN — pantoprazole (PROTONIX) injection 40 mg: 40 mg | INTRAVENOUS | @ 02:00:00

## 2021-10-13 MED ADMIN — rifAXIMin (XIFAXAN) tablet 550 mg: 550 mg | ORAL | @ 02:00:00 | Stop: 2022-10-12

## 2021-10-13 MED ADMIN — atorvastatin (LIPITOR) tablet 10 mg: 10 mg | ORAL | @ 02:00:00

## 2021-10-13 MED ADMIN — lisinopriL (PRINIVIL,ZESTRIL) tablet 2.5 mg: 2.5 mg | ORAL | @ 13:00:00

## 2021-10-13 NOTE — Unmapped (Signed)
A&Ox4. VSS. Admitted to 1 memorial; oriented to room. IV abx continued. ACHS. Urinal at bedside. Adequate intake and output. Call light within reach. Will continue to monitor.   Problem: Adult Inpatient Plan of Care  Goal: Plan of Care Review  Outcome: Progressing     Problem: Adult Inpatient Plan of Care  Goal: Absence of Hospital-Acquired Illness or Injury  Outcome: Progressing  Intervention: Identify and Manage Fall Risk  Recent Flowsheet Documentation  Taken 10/12/2021 1000 by Mickle Mallory, RN  Safety Interventions:   commode/urinal/bedpan at bedside   environmental modification   fall reduction program maintained   infection management   lighting adjusted for tasks/safety   low bed   nonskid shoes/slippers when out of bed  Intervention: Prevent and Manage VTE (Venous Thromboembolism) Risk  Recent Flowsheet Documentation  Taken 10/12/2021 1000 by Mickle Mallory, RN  Activity Management: activity adjusted per tolerance  Intervention: Prevent Infection  Recent Flowsheet Documentation  Taken 10/12/2021 1000 by Mickle Mallory, RN  Infection Prevention:   environmental surveillance performed   equipment surfaces disinfected   hand hygiene promoted   personal protective equipment utilized   rest/sleep promoted   single patient room provided     Problem: Infection  Goal: Absence of Infection Signs and Symptoms  Outcome: Progressing  Intervention: Prevent or Manage Infection  Recent Flowsheet Documentation  Taken 10/12/2021 1000 by Mickle Mallory, RN  Infection Management: aseptic technique maintained  Isolation Precautions: contact precautions maintained

## 2021-10-13 NOTE — Unmapped (Signed)
Hepatology Consult Service   Progress Note         Assessment & Plan:   Matthew Key is a 61 y.o. 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 Actively listed for liver transplant, blood group O. he presented to [facility] with nausea, vomiting, AMS. Pt is seen in consultation for cirrhosis with hepatic encephalopathy, nausea and vomiting.  ??  Altered mental status (resolved): suspect AMS 2/2 hepatic encephalopathy. No clear trigger, although may have missed some lactulose doses d/t vomiting. It is now resolved.  No ascites fluid so cannot do diagnostic para. UA not c/f UTI. Blood cultures pending. No c/f GI bleed at this time. Continue with lactulose titrated to 3-5 bowel movements, and xifaxan 550 mg BID.   --lactulose titrated to 3-5 bowel movements/day  --xifaxan 550 mg BID  -- Blood cxs pending   --Patient does have splenorenal shunt on imaging (CT scan from 08/25/2021). If he continues to have bad HE despite lactulose/xifaxan, may need to consider shunt embolization in future   ??  Blood tinged emesis in patient with h/o Gr1 varices: [name] has h/o gr1 varices seen on EGD 10/2020. He is not currently on betablocker. Propranolol was stopped when he developed hyponatremia and fluid issues. He did report some blood tinged emesis, which has now resolved. No reports of bloody stools or melena. Do not think he needs EGD urgently, and do not think he is having GI bleed. Recommended stopping octreotide and ceftriaxone. Discussed starting Coreg today, which he is in agreement with trying, for primary variceal bleed ppx.   --defer EGD   --Start Coreg 3.125 mg BID   ??  Nausea/vomiting (resolved): unclear source of nausea and vomiting. No ascites fluid amenable to tap, and improving currently.He has been eating and drinking since admission with no issues.       Pt expresses interest in discharge, which we think is reasonable. He has follow up with Dr Matthew Key on 4/17.   ??  Thank you for involving [place] in the care of your patient. We will sign-off at this time, please re-contact if additional questions or a new need for consultation arises.     For questions, please contact the on-call fellow for the Hepatology Consult Service at [phone].     Interval History:   No acute events overnight. Eating and drinking with no problems. He is interested in going home today, and hope to be able to call his son soon to come from [place] to pick him up. We discussed starting Coreg, the indication, and need to follow up with Dr Matthew Key.     Objective:   Temp:  [36.5 ??C (97.7 ??F)-36.8 ??C (98.2 ??F)] 36.8 ??C (98.2 ??F)  Heart Rate:  [76-97] 97  Resp:  [16-19] 16  BP: (122-145)/(57-82) 136/67  SpO2:  [95 %-100 %] 98 %    Gen: NAD, answers questions appropriately  Extremities: MAEE, no edema   Neuro: Alert, oriented, no focal deficits     Pertinent Labs/Studies Reviewed

## 2021-10-13 NOTE — Unmapped (Signed)
Insurance has been verified for 2023   ??  ??Patient??now??has active coverage with??[employer] Adv.??  ??  Authorization [id number] Liver transplant??listing valid until??01/09/22  ??  ??Patient is financially cleared for Liver transplant??listing

## 2021-10-13 NOTE — Unmapped (Signed)
Pt A&O. No PRNs given. Did not transfer to floor. Call bell in reach. Will continue to monitor.    Problem: Adult Inpatient Plan of Care  Goal: Plan of Care Review  Outcome: Ongoing - Unchanged  Goal: Patient-Specific Goal (Individualized)  Outcome: Ongoing - Unchanged  Goal: Absence of Hospital-Acquired Illness or Injury  Outcome: Ongoing - Unchanged  Intervention: Identify and Manage Fall Risk  Recent Flowsheet Documentation  Taken 10/12/2021 2000 by Delsa Bern, RN  Safety Interventions:   isolation precautions   low bed  Intervention: Prevent Skin Injury  Recent Flowsheet Documentation  Taken 10/12/2021 2000 by Delsa Bern, RN  Skin Protection: incontinence pads utilized  Intervention: Prevent and Manage VTE (Venous Thromboembolism) Risk  Recent Flowsheet Documentation  Taken 10/12/2021 2235 by Delsa Bern, RN  Activity Management: activity adjusted per tolerance  Range of Motion: Bilateral Upper and Lower Extremities  Taken 10/12/2021 2000 by Delsa Bern, RN  VTE Prevention/Management: anticoagulant therapy  Intervention: Prevent Infection  Recent Flowsheet Documentation  Taken 10/12/2021 2000 by Delsa Bern, RN  Infection Prevention: environmental surveillance performed  Goal: Optimal Comfort and Wellbeing  Outcome: Ongoing - Unchanged  Goal: Readiness for Transition of Care  Outcome: Ongoing - Unchanged  Goal: Rounds/Family Conference  Outcome: Ongoing - Unchanged     Problem: Infection  Goal: Absence of Infection Signs and Symptoms  Outcome: Ongoing - Unchanged  Intervention: Prevent or Manage Infection  Recent Flowsheet Documentation  Taken 10/12/2021 2000 by Delsa Bern, RN  Infection Management: aseptic technique maintained  Isolation Precautions: contact precautions maintained

## 2021-10-13 NOTE — Unmapped (Cosign Needed)
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   ??  Active Problems:    Non-alcoholic micronodular cirrhosis of liver (CMS-HCC)    Primary insomnia    OSA on CPAP    Type 2 diabetes mellitus without complication (CMS-HCC)    Abdominal ascites    Essential hypertension    Esophageal varices determined by endoscopy (CMS-HCC)    Gastroesophageal reflux disease    History of stroke without residual deficits    Hyperlipidemia    Obesity (BMI 30-39.9)    Hypertension associated with diabetes (CMS-HCC)  Resolved Problems:    * No resolved hospital problems. *  ??  Hepatic encephalopathy (resolved) - Acute decompensated cryptogenic cirrhosis   History of ascites, SBP (08/2021), HE, and nonbleeding esophageal varices (last EGD 10/2020). He is actively listed for liver transplant.??Adherent with ciprofloxacin PPx. Initial presentation of HE but now back to baseline and infectious work-up negative thus far (UA neg, 3/3 CT head neg, no asterixis on exam). Discontinued empiric SBP treatment and resumed on home Cipro ppx. N/V and abdominal pain resolved. No urgent need for EGD per Hepatology,   - Hepatology following, appreciate recs  - Hold home Lasix and spironolactone??in the setting of potential SBP  - Continue home lactulose, rifaximin  - Resumed home Cipro ppx.   - 3/3 BCx NGTD x 1, tentative discharge 3/5 if NGTD x 48 hrs and HDS  ??  Pink Tinged Hematemesis (resolved) - Gr1 varices  Hx gr1 varices seen on EGD 10/2020. Propranolol was stopped when he developed hyponatremia and fluid issues. No bloody stools or melena. Unlikely active GI bleed, EGD not indicated. OK with stopping octreotide and ceftriaxone. Will start beta blocker variceal ppx.  - No indication for EGD   - Start Coreg 3.125 BID  - F/U with Dr. Waynetta Sandy 4/17    AKI  Cr 1.3 from baseline 1.15. Encourage PO intake  - Continue to monitor    Thrombocytopenia  PLT 68, continue to monitor and will discontinue LVX is PLT <50  ??  Anemia  Hgb 11.1, near baseline   ??  Elev LFTs  Likely in setting or cirrhosis. AFT 56, ALP 161  ??  T2DM  Followed by Endocrinology. HbA1c at goal. Will hold lantus 3/3 since NPO.   - Decreased home Lantus 40U daily to Lantus 20U daily. SSI. Hold when NPO.   - Resume semaglutide as outpatient.  ??  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 Not on steroids.??  Insomnia:??Trazodone resumed. Atarax PRN for sleep  ??  Daily Checklist:  Diet:??Regular diet  DVT PPx:??Lovenox 40mg  q24h  Electrolytes:??Replete Potassium to >/= 3.6 and Magnesium to >/= 1.8  Code Status:??Full Code  Dispo: Home 3/5 or 3/6 pending Bcx results  ??  Team Contact Information:   Primary Team: Internal Medicine (MEDU)  Primary Resident: Estelle June, MD  Resident's Pager: [phone] (Gen MedU Intern - Cyndee Brightly)    Interval  History:   No acute events overnight. No N/V, no abd pain. Feels better and back to baseline. OK with waiting for blood culture to result before dispo.     All other systems were reviewed and are negative except as noted in the HPI    Objective:   Temp:  [36.5 ??C (97.7 ??F)-36.8 ??C (98.2 ??F)] 36.6 ??C (97.9 ??F)  Heart Rate:  [76-97] 84  Resp:  [16-18] 17  BP: (122-136)/(57-82) 136/68  SpO2:  [94 %-99 %] 98 %    Gen: Chronically ill appearing male, NAD, answers appropriately   Eyes: sclera icteric  Heart: RRR  Lungs: CTAB  Abdomen: soft nontender  Extremities: no asterixis  Skin: Jaundiced    Labs/Studies: Labs and Studies from the last 24hrs per EMR and Reviewed

## 2021-10-14 LAB — COMPREHENSIVE METABOLIC PANEL
ALBUMIN: 1.9 g/dL — ABNORMAL LOW (ref 3.4–5.0)
ALKALINE PHOSPHATASE: 149 U/L — ABNORMAL HIGH (ref 46–116)
ALT (SGPT): 24 U/L (ref 10–49)
ANION GAP: 6 mmol/L (ref 5–14)
AST (SGOT): 52 U/L — ABNORMAL HIGH (ref <=34)
BILIRUBIN TOTAL: 3.3 mg/dL — ABNORMAL HIGH (ref 0.3–1.2)
BLOOD UREA NITROGEN: 10 mg/dL (ref 9–23)
BUN / CREAT RATIO: 9
CALCIUM: 7.9 mg/dL — ABNORMAL LOW (ref 8.7–10.4)
CHLORIDE: 104 mmol/L (ref 98–107)
CO2: 25 mmol/L (ref 20.0–31.0)
CREATININE: 1.1 mg/dL
EGFR CKD-EPI (2021) MALE: 76 mL/min/{1.73_m2} (ref >=60)
GLUCOSE RANDOM: 237 mg/dL — ABNORMAL HIGH (ref 70–179)
POTASSIUM: 3.8 mmol/L (ref 3.4–4.8)
PROTEIN TOTAL: 5.6 g/dL — ABNORMAL LOW (ref 5.7–8.2)
SODIUM: 135 mmol/L (ref 135–145)

## 2021-10-14 LAB — CBC W/ AUTO DIFF
BASOPHILS ABSOLUTE COUNT: 0 10*9/L (ref 0.0–0.1)
BASOPHILS RELATIVE PERCENT: 0.6 %
EOSINOPHILS ABSOLUTE COUNT: 0.2 10*9/L (ref 0.0–0.5)
EOSINOPHILS RELATIVE PERCENT: 3.1 %
HEMATOCRIT: 27.6 % — ABNORMAL LOW (ref 39.0–48.0)
HEMOGLOBIN: 9.8 g/dL — ABNORMAL LOW (ref 12.9–16.5)
LYMPHOCYTES ABSOLUTE COUNT: 1.2 10*9/L (ref 1.1–3.6)
LYMPHOCYTES RELATIVE PERCENT: 21.8 %
MEAN CORPUSCULAR HEMOGLOBIN CONC: 35.4 g/dL (ref 32.0–36.0)
MEAN CORPUSCULAR HEMOGLOBIN: 34.3 pg — ABNORMAL HIGH (ref 25.9–32.4)
MEAN CORPUSCULAR VOLUME: 96.9 fL — ABNORMAL HIGH (ref 77.6–95.7)
MEAN PLATELET VOLUME: 10.1 fL (ref 6.8–10.7)
MONOCYTES ABSOLUTE COUNT: 1.1 10*9/L — ABNORMAL HIGH (ref 0.3–0.8)
MONOCYTES RELATIVE PERCENT: 19.1 %
NEUTROPHILS ABSOLUTE COUNT: 3.2 10*9/L (ref 1.8–7.8)
NEUTROPHILS RELATIVE PERCENT: 55.4 %
PLATELET COUNT: 61 10*9/L — ABNORMAL LOW (ref 150–450)
RED BLOOD CELL COUNT: 2.85 10*12/L — ABNORMAL LOW (ref 4.26–5.60)
RED CELL DISTRIBUTION WIDTH: 16.3 % — ABNORMAL HIGH (ref 12.2–15.2)
WBC ADJUSTED: 5.7 10*9/L (ref 3.6–11.2)

## 2021-10-14 LAB — PROTIME-INR
INR: 2
PROTIME: 23.3 s — ABNORMAL HIGH (ref 9.8–12.8)

## 2021-10-14 LAB — MAGNESIUM: MAGNESIUM: 1.7 mg/dL (ref 1.6–2.6)

## 2021-10-14 LAB — PHOSPHORUS: PHOSPHORUS: 2.8 mg/dL (ref 2.4–5.1)

## 2021-10-14 MED ORDER — GABAPENTIN 400 MG CAPSULE
ORAL_CAPSULE | Freq: Two times a day (BID) | ORAL | 0 refills | 30 days | Status: CP
Start: 2021-10-14 — End: 2021-11-13
  Filled 2021-10-14: qty 60, 30d supply, fill #0

## 2021-10-14 MED ORDER — LACTULOSE 10 GRAM/15 ML ORAL SOLUTION
Freq: Three times a day (TID) | ORAL | 0 refills | 30 days | Status: CP
Start: 2021-10-14 — End: 2021-11-13
  Filled 2021-10-14: qty 2700, 30d supply, fill #0

## 2021-10-14 MED ORDER — LACTULOSE 20 GRAM ORAL PACKET
PACK | Freq: Three times a day (TID) | ORAL | 0 refills | 30 days | Status: CN
Start: 2021-10-14 — End: 2021-11-13

## 2021-10-14 MED ORDER — CARVEDILOL 3.125 MG TABLET
ORAL_TABLET | Freq: Two times a day (BID) | ORAL | 0 refills | 30 days | Status: CP
Start: 2021-10-14 — End: 2021-11-13
  Filled 2021-10-14: qty 60, 30d supply, fill #0

## 2021-10-14 MED ORDER — TRESIBA FLEXTOUCH U-100 INSULIN 100 UNIT/ML (3 ML) SUBCUTANEOUS PEN
Freq: Every evening | SUBCUTANEOUS | 0 refills | 50 days | Status: CP
Start: 2021-10-14 — End: 2021-12-03
  Filled 2021-10-14: qty 15, 50d supply, fill #0

## 2021-10-14 MED ADMIN — lactulose (CEPHULAC) packet 20 g: 20 g | ORAL | @ 03:00:00

## 2021-10-14 MED ADMIN — traZODone (DESYREL) tablet 50 mg: 50 mg | ORAL | @ 03:00:00

## 2021-10-14 MED ADMIN — gabapentin (NEURONTIN) capsule 400 mg: 400 mg | ORAL | @ 03:00:00

## 2021-10-14 MED ADMIN — gabapentin (NEURONTIN) capsule 400 mg: 400 mg | ORAL | @ 16:00:00 | Stop: 2021-10-14

## 2021-10-14 MED ADMIN — lisinopriL (PRINIVIL,ZESTRIL) tablet 2.5 mg: 2.5 mg | ORAL | @ 16:00:00 | Stop: 2021-10-14

## 2021-10-14 MED ADMIN — ciprofloxacin HCl (CIPRO) tablet 500 mg: 500 mg | ORAL | @ 16:00:00 | Stop: 2021-10-14

## 2021-10-14 MED ADMIN — rifAXIMin (XIFAXAN) tablet 550 mg: 550 mg | ORAL | @ 03:00:00 | Stop: 2022-10-12

## 2021-10-14 MED ADMIN — carvediloL (COREG) tablet 3.125 mg: 3.125 mg | ORAL | @ 03:00:00

## 2021-10-14 MED ADMIN — rifAXIMin (XIFAXAN) tablet 550 mg: 550 mg | ORAL | @ 16:00:00 | Stop: 2021-10-14

## 2021-10-14 MED ADMIN — pantoprazole (PROTONIX) injection 40 mg: 40 mg | INTRAVENOUS | @ 16:00:00 | Stop: 2021-10-14

## 2021-10-14 MED ADMIN — atorvastatin (LIPITOR) tablet 10 mg: 10 mg | ORAL | @ 03:00:00

## 2021-10-14 MED ADMIN — ciprofloxacin HCl (CIPRO) tablet 500 mg: 500 mg | ORAL | @ 03:00:00 | Stop: 2021-10-20

## 2021-10-14 MED ADMIN — carvediloL (COREG) tablet 3.125 mg: 3.125 mg | ORAL | @ 16:00:00 | Stop: 2021-10-14

## 2021-10-14 NOTE — Unmapped (Signed)
Pt A&Ox4. On RA VSS. No report of pain overnight. Overnight Pt received unsettling news from family and stated he wanted to leave AMA. Provided Notified. Plan for discharge today.       Problem: Adult Inpatient Plan of Care  Goal: Plan of Care Review  Outcome: Progressing  Goal: Patient-Specific Goal (Individualized)  Outcome: Progressing  Goal: Absence of Hospital-Acquired Illness or Injury  Outcome: Progressing  Intervention: Identify and Manage Fall Risk  Recent Flowsheet Documentation  Taken 10/13/2021 2030 by Stephens November, RN  Safety Interventions: fall reduction program maintained  Intervention: Prevent Skin Injury  Recent Flowsheet Documentation  Taken 10/13/2021 2030 by Stephens November, RN  Skin Protection: adhesive use limited  Intervention: Prevent and Manage VTE (Venous Thromboembolism) Risk  Recent Flowsheet Documentation  Taken 10/13/2021 2030 by Stephens November, RN  Activity Management: activity adjusted per tolerance  Intervention: Prevent Infection  Recent Flowsheet Documentation  Taken 10/13/2021 2030 by Stephens November, RN  Infection Prevention: environmental surveillance performed  Goal: Optimal Comfort and Wellbeing  Outcome: Progressing  Goal: Readiness for Transition of Care  Outcome: Progressing  Goal: Rounds/Family Conference  Outcome: Progressing     Problem: Infection  Goal: Absence of Infection Signs and Symptoms  Outcome: Progressing  Intervention: Prevent or Manage Infection  Recent Flowsheet Documentation  Taken 10/13/2021 2030 by Stephens November, RN  Infection Management: aseptic technique maintained  Isolation Precautions: contact precautions maintained

## 2021-10-14 NOTE — Unmapped (Signed)
A&Ox4. VSS. Patient denies pain. Independent in room.  Ambulated in hallway. ACHS. Adequate intake and output. Call light within reach. Will continue to monitor.   Problem: Adult Inpatient Plan of Care  Goal: Plan of Care Review  Outcome: Progressing     Problem: Adult Inpatient Plan of Care  Goal: Absence of Hospital-Acquired Illness or Injury  Outcome: Progressing  Intervention: Identify and Manage Fall Risk  Recent Flowsheet Documentation  Taken 10/13/2021 0800 by Mickle Mallory, RN  Safety Interventions:   commode/urinal/bedpan at bedside   environmental modification   fall reduction program maintained   infection management   lighting adjusted for tasks/safety   low bed   nonskid shoes/slippers when out of bed  Intervention: Prevent Skin Injury  Recent Flowsheet Documentation  Taken 10/13/2021 0815 by Mickle Mallory, RN  Skin Protection: adhesive use limited  Intervention: Prevent and Manage VTE (Venous Thromboembolism) Risk  Recent Flowsheet Documentation  Taken 10/13/2021 0800 by Mickle Mallory, RN  Activity Management: activity adjusted per tolerance  Intervention: Prevent Infection  Recent Flowsheet Documentation  Taken 10/13/2021 0800 by Mickle Mallory, RN  Infection Prevention:   environmental surveillance performed   equipment surfaces disinfected   hand hygiene promoted   personal protective equipment utilized   rest/sleep promoted   single patient room provided     Problem: Infection  Goal: Absence of Infection Signs and Symptoms  Outcome: Progressing  Intervention: Prevent or Manage Infection  Recent Flowsheet Documentation  Taken 10/13/2021 0800 by Mickle Mallory, RN  Infection Management: aseptic technique maintained

## 2021-10-14 NOTE — Unmapped (Signed)
Physician Discharge Summary [facility]  1 [facility] OBSERVATION [facility]  [address]  [place] [address]  Dept: [phone]  Loc: [phone]     Identifying Information:   Matthew Key  [birth year]  [id number]    Primary Care Physician: Loman Brooklyn, MD     Code Status: Full Code    Admit Date: 10/11/2021    Discharge Date: 10/14/2021     Discharge To: Home    Discharge Service: [facility] - General Medicine Floor Team (MEDU)     Discharge Attending Physician: Mauri Brooklyn, MD    Discharge Diagnoses:   Principal Problem:    Acute metabolic encephalopathy POA: Yes  Active Problems:    Non-alcoholic micronodular cirrhosis of liver (CMS-HCC) POA: Yes    Primary insomnia POA: Yes    OSA on CPAP POA: Not Applicable    Type 2 diabetes mellitus without complication (CMS-HCC) POA: Yes    Abdominal ascites POA: Yes    Essential hypertension POA: Yes    Esophageal varices determined by endoscopy (CMS-HCC) POA: Yes    Gastroesophageal reflux disease POA: Yes    History of stroke without residual deficits POA: Not Applicable    Hyperlipidemia POA: Yes    Obesity (BMI 30-39.9) POA: Yes    Hypertension associated with diabetes (CMS-HCC) POA: Yes    Decompensated hepatic cirrhosis (CMS-HCC) POA: Yes  Resolved Problems:    * No resolved hospital problems. *      Hospital Course:   Matthew Key is a 61 y.o. 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 with confusion and abdominal pain.    Hepatic encephalopathy (resolved) - Acute decompensated cryptogenic cirrhosis   History of ascites, SBP (08/2021), HE, and nonbleeding esophageal varices (last EGD 10/2020). He is actively listed for liver transplant. Adherent with ciprofloxacin PPx. Initial presentation of HE but now back to baseline and infectious work-up negative thus far (UA neg, 3/3 CT head neg, no asterixis on exam). Discontinued empiric SBP treatment CTX x 1 and resumed on home Cipro ppx. N/V and abdominal pain resolved. No urgent need for EGD per Hepatology. Possible ned for future shunt embolization per hepatology team. Will follow-up with Dr. Waynetta Sandy 4/17. Plan to continue home lactulose, rifaximin. 3/3 BCx NGTD x 1, at time of discharge 3/5 pending Bcx 48 hour read    Pink Tinged Hematemesis (resolved) - Gr1 varices  Hx gr1 varices seen on EGD 10/2020. Propranolol was stopped when he developed hyponatremia and fluid issues. No bloody stools or melena. Unlikely active GI bleed, EGD not indicated. OK with stopping octreotide and ceftriaxone. Started beta blocker variceal ppx. No indication for EGD. Started on Coreg 3.125 BID. Has appointment to F/U with Dr. Waynetta Sandy 4/17    AKI (resolved)  Resolved with increased PO intake. Likely pre-renal     Thrombocytopenia: stable  while IP    Anemia: Hgb 11.1, near baseline      Elev LFTs: Likely in setting or cirrhosis. AFT 56, ALP 161     T2DM  Followed by Endocrinology. HbA1c at goal.  - Decreased home Lantus 40U daily to Lantus 20U daily while IP, discharged on 30 units   - Resume semaglutide as outpatient     Chronic medical conditions:  HTN: Continue home lisinopril  HLD: Continue home atorvastatin  OSA: cPAP nightly  Moderate pulmonary HTN:  Diagnosed via RHC with mean PA 46. 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 Not on steroids.   Insomnia: Trazodone continued while IP, consider restarting as OP       The patient's hospital stay has been complicated by the following clinically significant conditions requiring additional evaluation and treatment or having a significant effect of this patient's care: - Thrombocytopenia POA requiring further investigation or monitor  - Anemia POA requiring further investigation or monitoring     Nutrition Assessment:     Outpatient Provider Follow Up Issues:   [ ]  F/u Bcx from 3/3 pending 48 hour read - DC'd prior to lab resulting as patient's father in law passed away and requests to go home 3/5. Given HDS and NGTD on 24 hour read, our team felt discharge at this time was reasonable with close f/u of Bcx  [ ]  F/u with hepatology OP  [ ]  F/u BG trend and adjust insulin as needed; decreased Tresiba to 30 units from 40 units at bedtime given BG trend while IP    Touchbase with Outpatient Provider:  Warm Handoff: Completed on 10/14/21 by Jonette Mate  (Resident) via Other Routing DC summary    Procedures:  None  ______________________________________________________________________  Discharge Medications:     Your Medication List      STOP taking these medications    insulin degludec 100 unit/mL (3 mL) Inpn  Commonly known as: TRESIBA FLEXTOUCH U-100  Replaced by: TRESIBA FLEXTOUCH U-100 100 unit/mL (3 mL) Inpn     polyethylene glycol 17 gram packet  Commonly known as: MIRALAX        START taking these medications    carvediloL 3.125 MG tablet  Commonly known as: COREG  Take 1 tablet (3.125 mg total) by mouth Two (2) times a day.     TRESIBA FLEXTOUCH U-100 100 unit/mL (3 mL) Inpn  Generic drug: insulin degludec  Inject 0.3 mL (30 Units total) under the skin at bedtime. Adjust as instructed.  Replaces: insulin degludec 100 unit/mL (3 mL) Inpn        CHANGE how you take these medications    lactulose 10 gram/15 mL solution  Take 30 mL (20 g total) by mouth Three (3) times a day.  What changed: See the new instructions.     OZEMPIC 1 mg/dose (4 mg/3 mL) Pnij injection  Generic drug: semaglutide  Inject 1 mg under the skin every seven (7) days.  What changed: additional instructions        CONTINUE taking these medications    acetaminophen 325 MG tablet  Commonly known as: TYLENOL  Take 2 tablets (650 mg total) by mouth every six (6) hours as needed. atorvastatin 10 MG tablet  Commonly known as: LIPITOR  Take 10 mg by mouth nightly.     ciprofloxacin HCl 500 MG tablet  Commonly known as: CIPRO  TAKE 1 TABLET BY MOUTH IN THE MORNING     flash glucose sensor kit  1 each by Other route every fourteen (14) days. Insert 1 sensor SQ q14 days; give #2 (30 day supply) or #6 (90 day supply) per pt preference     furosemide 20 MG tablet  Commonly known as: LASIX  Take 2 tablets (40 mg total) by mouth daily.     gabapentin 400 MG capsule  Commonly known as: NEURONTIN  Take 1 capsule (400 mg total) by mouth two (2) times a day.     hydrOXYzine 50 MG capsule  Commonly known as: VISTARIL     insulin syringe-needle U-100 1 mL 31 gauge x 5/16 (8 mm) Syrg  Use once as needed for Hydrocortisone injection.Marland Kitchen     lisinopriL 2.5 MG tablet  Commonly known as: PRINIVIL,ZESTRIL  Take 2.5 mg by mouth daily.     ondansetron 4 MG disintegrating tablet  Commonly known as: ZOFRAN-ODT  TAKE 1 TABLET BY MOUTH EVERY 6 HOURS FOR 7 DAYS     pantoprazole 40 MG tablet  Commonly known as: PROTONIX  Take 1 tablet (40 mg total) by mouth daily.     pen needle, diabetic 32 gauge x 5/32 (4 mm) Ndle  Commonly known as: BD ULTRA-FINE NANO PEN NEEDLE  ok to sub any brand or size needle preferred by insurance/patient, use 1-2x/day, dx E11.65     rifAXIMin 550 mg Tab  Commonly known as: XIFAXAN  Take 1 tablet (550 mg total) by mouth Two (2) times a day.     sodium chloride 0.65 % Drop  Commonly known as: AYR  1 spray.     spironolactone 25 MG tablet  Commonly known as: ALDACTONE  Take 2 tablets (50 mg total) by mouth daily.     syringe with needle 3 mL 23 x 1 Syrg  Use as needed for Hydrocortisone injection            Allergies:  Venom-honey bee  ______________________________________________________________________  Pending Test Results:  Pending Labs     Order Current Status    Blood Culture #1 Preliminary result    Blood Culture #2 Preliminary result          Most Recent Labs:  All lab results last 24 hours -   Recent Results (from the past 24 hour(s))   POCT Glucose    Collection Time: 10/13/21  4:59 PM   Result Value Ref Range    Glucose, POC 139 70 - 179 mg/dL   POCT Glucose    Collection Time: 10/13/21  7:54 PM   Result Value Ref Range    Glucose, POC 107 70 - 179 mg/dL   Comprehensive Metabolic Panel    Collection Time: 10/14/21  4:22 AM   Result Value Ref Range    Sodium 135 135 - 145 mmol/L    Potassium 3.8 3.4 - 4.8 mmol/L    Chloride 104 98 - 107 mmol/L    CO2 25.0 20.0 - 31.0 mmol/L    Anion Gap 6 5 - 14 mmol/L    BUN 10 9 - 23 mg/dL    Creatinine 1.61 0.96 - 1.10 mg/dL    BUN/Creatinine Ratio 9     eGFR CKD-EPI (2021) Male 76 >=60 mL/min/1.47m2    Glucose 237 (H) 70 - 179 mg/dL    Calcium 7.9 (L) 8.7 - 10.4 mg/dL    Albumin 1.9 (L) 3.4 - 5.0 g/dL    Total Protein 5.6 (L) 5.7 - 8.2 g/dL    Total Bilirubin 3.3 (H) 0.3 - 1.2 mg/dL    AST 52 (H) <=04 U/L    ALT 24 10 - 49 U/L    Alkaline Phosphatase 149 (H) 46 - 116 U/L   Magnesium Level    Collection Time: 10/14/21  4:22 AM   Result Value Ref Range    Magnesium 1.7 1.6 - 2.6 mg/dL   Phosphorus Level    Collection Time: 10/14/21  4:22  AM   Result Value Ref Range    Phosphorus 2.8 2.4 - 5.1 mg/dL   PT-INR    Collection Time: 10/14/21  4:22 AM   Result Value Ref Range    PT 23.3 (H) 9.8 - 12.8 sec    INR 2.00    CBC w/ Differential    Collection Time: 10/14/21  4:22 AM   Result Value Ref Range    WBC 5.7 3.6 - 11.2 10*9/L    RBC 2.85 (L) 4.26 - 5.60 10*12/L    HGB 9.8 (L) 12.9 - 16.5 g/dL    HCT 16.1 (L) 09.6 - 48.0 %    MCV 96.9 (H) 77.6 - 95.7 fL    MCH 34.3 (H) 25.9 - 32.4 pg    MCHC 35.4 32.0 - 36.0 g/dL    RDW 04.5 (H) 40.9 - 15.2 %    MPV 10.1 6.8 - 10.7 fL    Platelet 61 (L) 150 - 450 10*9/L    Neutrophils % 55.4 %    Lymphocytes % 21.8 %    Monocytes % 19.1 %    Eosinophils % 3.1 %    Basophils % 0.6 %    Absolute Neutrophils 3.2 1.8 - 7.8 10*9/L    Absolute Lymphocytes 1.2 1.1 - 3.6 10*9/L    Absolute Monocytes 1.1 (H) 0.3 - 0.8 10*9/L    Absolute Eosinophils 0.2 0.0 - 0.5 10*9/L    Absolute Basophils 0.0 0.0 - 0.1 10*9/L    Anisocytosis Slight (A) Not Present   POCT Glucose    Collection Time: 10/14/21  7:51 AM   Result Value Ref Range    Glucose, POC 134 70 - 179 mg/dL       Relevant Studies/Radiology:  XR Chest Portable    Result Date: 10/12/2021  EXAM: XR CHEST PORTABLE DATE: 10/12/2021 12:52 PM ACCESSION: [id number] UN DICTATED: 10/12/2021 1:08 PM INTERPRETATION LOCATION: [facility] CLINICAL INDICATION: 61 years old Male with PNEUMONIA  TECHNIQUE: Single View AP Chest Radiograph. COMPARISON: Chest 08/25/2021 FINDINGS: No support devices. Lungs are clear. No pleural effusion. No pneumothorax. Minimal left basilar atelectasis. Normal cardiac silhouette.     Minimal left basilar atelectasis.    CT Head Wo Contrast    Result Date: 10/12/2021  EXAM: Computed tomography, head or brain without contrast material. DATE: 10/12/2021 12:09 AM ACCESSION: [id number] UN DICTATED: 10/12/2021 12:43 AM INTERPRETATION LOCATION: [facility] CLINICAL INDICATION: 61 years old Male with ams ; Dizziness, dehydration or hypotension ; Ataxia, head trauma  COMPARISON: CT head 06/21/2021 TECHNIQUE: Axial CT images of the head  from skull base to vertex without contrast. FINDINGS: There are scattered hypodense foci within the periventricular and deep white matter.  These are nonspecific but commonly associated with small vessel ischemic changes. Similar appearance of prominent ventricles and moderate global cerebral volume loss.. There is no midline shift. No mass lesion. There is no evidence of acute infarct. No acute intracranial hemorrhage. No fractures are evident. Left maxillary mucous retention cyst. Complete opacification of the right frontal sinus and a small amount of frothy secretions with mild mucosal thickening in the right maxillary sinus. S-shaped deviation of the nasal septum     --No acute intracranial abnormality. ______________________________________________________________________  Discharge Instructions:   Activity Instructions     Activity as tolerated                     Follow Up instructions and Outpatient Referrals     Call MD for:  difficulty breathing, headache  or visual disturbances      Call MD for:  persistent nausea or vomiting      Call MD for:  severe uncontrolled pain      Call MD for:  temperature >38.5 Celsius      Discharge instructions          Appointments which have been scheduled for you    Oct 16, 2021 11:00 AM  (Arrive by 10:30 AM)  RETURN HCP TELEPHONE with Smiley Houseman  [facility] LIVER TRANSPLANT [facility]) [address]  [place] [address]  [phone]      Nov 26, 2021  1:00 PM  (Arrive by 12:45 PM)  RETURN  HEPATOLOGY with Marene Lenz, MD  [facility] MEDICINE [place] [facility]) [address]  [place] [address]  [phone]      Dec 17, 2021  9:20 AM  (Arrive by 9:05 AM)  RETURN  DIABETES with Arelia Longest, MD  [facility] DIABETES AND ENDOCRINOLOGY [place] [facility]) [address]  [place] [address]  [phone]      Jan 03, 2022  9:00 AM  (Arrive by 8:45 AM)  US ABDOMEN COMPLETE with [facility] [place] RM 1  IMG ULTRASOUND [facility]) [address]  [place] [address]  [phone]   Preparing for the appointment:  Fasting before the appointment  You must refrain from eating or drinking prior to the appointment. However, if you are taking medications please take them as usual on the day of the procedure with a small sip of water.  Before the appointment  88-73 years old: Do not eat or drink anything for three (3) hours prior to the study.  5 years and older: Do not eat or drink anything for six (6) hours prior to the study.     Feb 26, 2022 11:45 AM  (Arrive by 11:30 AM)  MRI ABDOMEN W WO CONTRA    -UN with [facility] MRI RM 1  [facility]) [address]  [place] [address]  [phone]   On appt date:  Bring recent lab work  Bring documentation of any metal object implants  Take meds as usual  Check w/physician if diabetic  You will be asked to change into a gown for your safety    On appt date do not:  Consume anything 2 hrs  Wear metallic items including jewelry (we are not responsible for lost items)    Let us know if pt:  Claustrophobic  Metal object implant  Pregnant  Prescribed a sedative  On dialysis  Allergic to MRI dye/contrast  Kidney Failure    (Title:MRIWCNTRST)          ______________________________________________________________________  Discharge Day Services:  BP 118/49  - Pulse 76  - Temp 36.6 ??C (97.9 ??F) (Oral)  - Resp 16  - SpO2 96%     Pt seen on the day of discharge and determined appropriate for discharge.    Condition at Discharge: good    Length of Discharge: I spent greater than 30 mins in the discharge of this patient.    Amada Jupiter, MD  Internal Medicine PGY 2

## 2021-10-15 DIAGNOSIS — K7210 Chronic hepatic failure without coma: Principal | ICD-10-CM

## 2021-10-15 NOTE — Unmapped (Signed)
Patient declined refill. He reports he found another 1 month supply of Xifaxan. Patient reports he has all the medications he needs right now (the 4 picked up yesterday from [facility], and the pantoprazole and spironolactone also picked up #30 days on 1/20). We agreed I would call him back in about 3 weeks. Patient has no questions for the pharmacist.

## 2021-10-16 ENCOUNTER — Institutional Professional Consult (permissible substitution): Admit: 2021-10-16 | Discharge: 2021-10-17 | Payer: MEDICARE

## 2021-10-16 NOTE — Unmapped (Signed)
CSW received communication from transplant team that patient's caregiving plan has changed and he is having financial issues. Patient's mother has passed; patient's wife's father passed away on 11/04/21; patient wife is receiving cancer treatment and is no longer working.     Per May 2022 Psychosocial Consult:     Mr. Harville states that his wife,??Cordelia Pen??continues to be the identified primary caregiver. Cordelia Pen works full-time and reports that she??has cared for mother who passed from cancer and 'if I can do that, I can care for him'. She will take a leave absence from work. Cordelia Pen does not drive (never had a driver's license) but is willing to attend his appointments with him. Mr. Basso identifies his mother, Carney Bern as a back-up caregiver along with someone that can provide transportation. Mr. Banh sister, Zella Ball has agreed to provide back-up caregiving. Lastly, his son/DIL will be actively involved as back-up caregivers as needed.??    Patient confirms his mother passed away unexpectedly in early August 2022; wife diagnosed at the end of the month of August 2022 with breast cancer and is receiving ongoing treatment. Wife is on FMLA.     Confirmed that patient is now receiving disability on the 17th of every month - around $1830; wife is on FMLA but no longer receiving a paycheck as her leave time has been exhausted. Reports that wife was denied unemployment and now is in the process of applying for disability as well.     Confirmed patient received MyChart message regarding information needed in order to apply for [facility] financial assistance.     Regarding caregiving, we discussed that his primary and secondary caregivers are no longer acceptable. Wife is actively receiving medical care for cancer treatment and needs an identified caregiver as well. His two sisters, Zella Ball and [name] along with patient's son are the identified back-up. Patient has not had time to speak to them due to the passing of his wife's father, understandably; will post-pone further discussion at this time until 3/14 at 11am via telehealth visit.     Flint Melter, LCSW  Transplant Case Manager  10/16/2021. 11:26 AM

## 2021-10-17 NOTE — Unmapped (Incomplete)
Call placed to pt and his wife to check on pt status. No answer, VM with contact info left.

## 2021-10-17 NOTE — Unmapped (Signed)
Returned on call page - pt fell back and hit head & shoulder while trying to grab his hat. Pt denied any LOC, but c/o pain on head and shoulder. Pt currently at [facility] ED waiting to be evaluated. Provided  transfer center number and advised pt to ask for provider to contact hepatologist on call. Pt very appreciative & verbalized understanding.

## 2021-10-19 NOTE — Unmapped (Signed)
CONFIDENTIAL PSYCHOLOGICAL NOTE/FOLLOW-UP    Patient Name: Matthew Key  Medical Record Number: [id number]  Date of Service: October 19, 2021  Clinical Psychologist: Deedra Ehrich, PsyD  Evaluation Duration and Procedures: 75 minute face-to-face clinical interview; record review; case consultation; 30 minutes spent on pre and post- appt activities    This evaluation note may contain sensitive and confidential information regarding the patient???s psychosocial adjustment to living with a chronic medical condition. DO NOT share this information outside [facility] without written consent from the patient explicitly stating that mental health records may be released.     The limits of confidentiality and the purpose of the evaluation were reviewed. The patient was provided with a verbal description of the nature and purpose of the psychological evaluation. I also reviewed the referral source, specific referral question for this evaluation, foreseeable risks/discomforts, benefits, limits of confidentiality, and mandatory reporting requirements of this provider. The patient was given the opportunity to ask questions and receive answers about the present evaluation. Oral consent was provided by the patient.     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  He??was??initiay??seen for a psychological evaluation as one part of a comprehensive assessment for liver transplantation??and for treatment planning??on 07/20/18 and the following recommendations were given:  ??  ??  1. He is encouraged to consider MH counseling for support in coping with family stressors and adjustment to chronic illness, though this should not be considered a condition of listing. Though not discussed today, he and his wife could also likely benefit from marital counseling.   2. Annual psychology follow-up for monitoring of mood/coping.  3. Annual SW follow-up for monitoring of marital conflict/suitability of primary caregiving plan with his wife.   4. Pt is advised to contact transplant SW or financial coordinator should he have difficulties affording his medications in the future.    ??  Pt was seen by writer on 01/03/20 for follow and recommended the following:  ??  1. Continue to adhere to all medical recommendations.   2. Improve dietary adherence and exercise as recommended towards weight loss goals.   3. Continue to monitor mood and report any changes to transplant psychologist/TNC.??      Pt was seen for follow up by writer on 12/19/20 and found to be a good candidate for transplant from a psychological standpoint with the following recommendations:    ??  1. Continue to adhere to all medical recommendations.   2. Continue to work towards weight loss goals.   3. Continue to monitor mood and report any changes to transplant psychologist/TNC.    Pt was seen today for follow-up.     BEHAVIORAL OBSERVATIONS:  [name] arrived for his appointment  on time.  He was interviewed alone.  Rapport was easily established.  He did not seem motivated to present  himself in an overly favorable light    MENTAL STATUS EXAM:  Appearance: Appears stated age, Well nourished, Well developed and Clean/Neat  Motor: No abnormal movements  Speech/Language: Normal rate, volume, tone, fluency  Mood: Depressed  Affect: Decreased range  Thought Process: Logical, linear, clear, coherent, goal directed  Thought Content: Denies SI, HI, self harm, delusions, obsessions, paranoid ideation, or ideas of reference  Perceptual Disturbances: Denies auditory and visual hallucinations, behavior not concerning for response to internal stimuli  Orientation: Oriented to person, place, time, and general circumstances  Attention: Able to fully attend without fluctuations in consciousness  Concentration: Able to fully concentrate and attend  Memory: Immediate, short-term, long-term, and recall grossly intact  Fund of Knowledge: Consistent with level of education and development  Insight: Intact  Judgment: Intact  Impulse Control: Intact    HEALTH/MEDICAL:  Interval Changes: Pt reported he has not been sleeping well. Functional status not great, working with PT at home.   Current Symptoms: poor sleep, fatigue, some confusion, depression; poor appetite  Pain (0=no pain; 10=worst pain imaginable): 4/10  Pain Medications:  use them as prescribed  Medications:   Current Outpatient Medications   Medication Sig Dispense Refill   ??? acetaminophen (TYLENOL) 325 MG tablet Take 2 tablets (650 mg total) by mouth every six (6) hours as needed.  0   ??? atorvastatin (LIPITOR) 10 MG tablet Take 10 mg by mouth nightly.     ??? carvediloL (COREG) 3.125 MG tablet Take 1 tablet (3.125 mg total) by mouth Two (2) times a day. 60 tablet 0   ??? ciprofloxacin HCl (CIPRO) 500 MG tablet TAKE 1 TABLET BY MOUTH IN THE MORNING 30 tablet 0   ??? lactulose 10 gram/15 mL solution Take 30 mL (20 g total) by mouth Three (3) times a day. 2700 mL 0   ??? flash glucose sensor kit 1 each by Other route every fourteen (14) days. Insert 1 sensor SQ q14 days; give #2 (30 day supply) or #6 (90 day supply) per pt preference 2 each 5   ??? furosemide (LASIX) 20 MG tablet Take 2 tablets (40 mg total) by mouth daily. 60 tablet 0   ??? gabapentin (NEURONTIN) 400 MG capsule Take 1 capsule (400 mg total) by mouth two (2) times a day. 60 capsule 0   ??? hydrOXYzine (VISTARIL) 50 MG capsule      ??? insulin syringe-needle U-100 1 mL 31 gauge x 5/16 (8 mm) Syrg Use once as needed for Hydrocortisone injection.. 10 each 0   ??? lisinopriL (PRINIVIL,ZESTRIL) 2.5 MG tablet Take 2.5 mg by mouth daily.     ??? ondansetron (ZOFRAN-ODT) 4 MG disintegrating tablet TAKE 1 TABLET BY MOUTH EVERY 6 HOURS FOR 7 DAYS     ??? pantoprazole (PROTONIX) 40 MG tablet Take 1 tablet (40 mg total) by mouth daily. 30 tablet 0   ??? pen needle, diabetic (BD ULTRA-FINE NANO PEN NEEDLE) 32 gauge x 5/32 (4 mm) Ndle ok to sub any brand or size needle preferred by insurance/patient, use 1-2x/day, dx E11.65 100 each 12   ??? rifAXIMin (XIFAXAN) 550 mg Tab Take 1 tablet (550 mg total) by mouth Two (2) times a day. 60 tablet 5   ??? semaglutide (OZEMPIC) 1 mg/dose (4 mg/3 mL) PnIj injection Inject 1 mg under the skin every seven (7) days. 3 mL 3   ??? sodium chloride (AYR) 0.65 % Drop 1 spray.     ??? spironolactone (ALDACTONE) 25 MG tablet Take 2 tablets (50 mg total) by mouth daily. 60 tablet 0   ??? syringe with needle 3 mL 23 x 1 Syrg Use as needed for Hydrocortisone injection 1 each 0   ??? TRESIBA FLEXTOUCH U-100 100 unit/mL (3 mL) InPn Inject 0.3 mL (30 Units total) under the skin at bedtime. Adjust as instructed. 15 mL 0     No current facility-administered medications for this visit.       ADHERENCE ISSUES:  Diet Adherence: Pt reported poor appetite; pain in mouth due to dental work.  Medication Management: Good  Medication Adherence: Good  Medication Concerns: denied problems taking  medications, concerns about side effects, affordability, problems obtaining medications, and difficulty remembering medications  Attendance to appointments: Good     Health Literacy Estimation:  Good     Dialysis: N/A  Diabetes: Good  Sleep Apnea: N/A    SUBSTANCE USE ISSUES:   Denied    TRANSPLANT ISSUES:  Interval changes since last evaluation: Denied    MENTAL HEALTH:   Mental health changes since last evaluation: Pt reported increase in depression due to recent losses in family including his mother in August 2022. Pt's wife was diagnosed with breast cancer in August as well. She is currently in treatment and on FMLA. Finances are being impacted greatly by this.   Mood: depressed; irritable    Pt is interested in speaking with a psychiatrist about medications given poor effect with trazodone. Encouraged pt to discuss referral to psychiatrist with PCP. Writer will also discuss options with hepatology. Encouraged pt to continue to take lactulose as recommended to help with HE symptoms.     SOCIAL ISSUES:   Interval changes since last evaluation: Yes; SW informed and f/u visit scheduled    INTERVENTION:  Psychological assessment    PSYCHIATRIC DIAGNOSIS:  Adjustment disorder with depressed mood; insomnia due to medical condition     IMPRESSIONS AND RECOMMENDATIONS:      [name] was seen today for pre-transplant follow up. He reported good overall adherence to his medical directives, but does not appear to be taking all lactulose doses. He denied substance issues. He reported strong motivation for transplant, realistic expectations, and good understanding of the process. His social support is no longer appearing adequate and will need to be assessed by TSW. Pt endorsed symptoms of depression and has been having difficulty sleeping.      There ARE apparent psychological contraindications to transplant.     We have some concerns about Matthew Key candidacy on the transplant list. To optimize his Matthew Key outcomes while listed and after transplant, the following is recommended:     1. Caregiving plan will need to be assessed in light of passing of pt's mother and illness of his wife. TSW and TNC informed.  2. Pt to discuss referral to psychiatry with PCP for medication management.   3. Pt should take lactulose as recommended to prevent HE symptoms.   4. Writer to discuss current symptoms and medication options with hepatology as well.   5. Return to transplant psychology in 3 months or sooner if indicated.     Final decision regarding listing status is based upon committee review at selection meeting.     Recommendations discussed with patient?  yes  Agreed upon by patient?  yes     Mr.  Matthew Key was given this writer's business card with confidential voice mail number and instructed to call 911 for emergencies.

## 2021-10-22 DIAGNOSIS — Z7682 Awaiting organ transplant status: Principal | ICD-10-CM

## 2021-10-22 DIAGNOSIS — K7210 Chronic hepatic failure without coma: Principal | ICD-10-CM

## 2021-10-22 DIAGNOSIS — R0602 Shortness of breath: Principal | ICD-10-CM

## 2021-10-22 NOTE — Unmapped (Signed)
Diarrhea x2 days ( took some pepto bismol). Wife thinks pt's stomach is distended with L >R side. Started coughing also about 2 days ago. Endorses SOB and thinks he needs a thoracentesis. Daughter is visiting so pt would like to have procedure if needed at [facility].  TNC will make [name] aware and see what he recommends.

## 2021-10-23 ENCOUNTER — Ambulatory Visit: Admit: 2021-10-23 | Discharge: 2021-10-25 | Payer: MEDICARE

## 2021-10-23 LAB — CBC W/ AUTO DIFF
BASOPHILS ABSOLUTE COUNT: 0 10*9/L (ref 0.0–0.1)
BASOPHILS RELATIVE PERCENT: 0.7 %
EOSINOPHILS ABSOLUTE COUNT: 0.2 10*9/L (ref 0.0–0.5)
EOSINOPHILS RELATIVE PERCENT: 4.5 %
HEMATOCRIT: 29.6 % — ABNORMAL LOW (ref 39.0–48.0)
HEMOGLOBIN: 10 g/dL — ABNORMAL LOW (ref 12.9–16.5)
LYMPHOCYTES ABSOLUTE COUNT: 1.3 10*9/L (ref 1.1–3.6)
LYMPHOCYTES RELATIVE PERCENT: 27.1 %
MEAN CORPUSCULAR HEMOGLOBIN CONC: 33.9 g/dL (ref 32.0–36.0)
MEAN CORPUSCULAR HEMOGLOBIN: 34 pg — ABNORMAL HIGH (ref 25.9–32.4)
MEAN CORPUSCULAR VOLUME: 100.4 fL — ABNORMAL HIGH (ref 77.6–95.7)
MEAN PLATELET VOLUME: 10.1 fL (ref 6.8–10.7)
MONOCYTES ABSOLUTE COUNT: 1.1 10*9/L — ABNORMAL HIGH (ref 0.3–0.8)
MONOCYTES RELATIVE PERCENT: 22.7 %
NEUTROPHILS ABSOLUTE COUNT: 2.2 10*9/L (ref 1.8–7.8)
NEUTROPHILS RELATIVE PERCENT: 45 %
NUCLEATED RED BLOOD CELLS: 0 /100{WBCs} (ref <=4)
PLATELET COUNT: 59 10*9/L — ABNORMAL LOW (ref 150–450)
RED BLOOD CELL COUNT: 2.95 10*12/L — ABNORMAL LOW (ref 4.26–5.60)
RED CELL DISTRIBUTION WIDTH: 17 % — ABNORMAL HIGH (ref 12.2–15.2)
WBC ADJUSTED: 4.8 10*9/L (ref 3.6–11.2)

## 2021-10-23 LAB — URINALYSIS WITH MICROSCOPY WITH CULTURE REFLEX
BACTERIA: NONE SEEN /HPF
BILIRUBIN UA: NEGATIVE
BLOOD UA: NEGATIVE
GLUCOSE UA: NEGATIVE
KETONES UA: NEGATIVE
LEUKOCYTE ESTERASE UA: NEGATIVE
NITRITE UA: NEGATIVE
PH UA: 5 (ref 5.0–9.0)
PROTEIN UA: NEGATIVE
RBC UA: <1 /HPF (ref <=3)
SPECIFIC GRAVITY UA: 1.006 (ref 1.003–1.030)
SQUAMOUS EPITHELIAL: <1 /HPF (ref 0–5)
UROBILINOGEN UA: <2
WBC UA: <1 /HPF (ref <=2)

## 2021-10-23 LAB — COMPREHENSIVE METABOLIC PANEL
ALBUMIN: 2 g/dL — ABNORMAL LOW (ref 3.4–5.0)
ALKALINE PHOSPHATASE: 215 U/L — ABNORMAL HIGH (ref 46–116)
ALT (SGPT): 26 U/L (ref 10–49)
ANION GAP: 3 mmol/L — ABNORMAL LOW (ref 5–14)
AST (SGOT): 54 U/L — ABNORMAL HIGH (ref <=34)
BILIRUBIN TOTAL: 2.9 mg/dL — ABNORMAL HIGH (ref 0.3–1.2)
BLOOD UREA NITROGEN: 35 mg/dL — ABNORMAL HIGH (ref 9–23)
BUN / CREAT RATIO: 19
CALCIUM: 9 mg/dL (ref 8.7–10.4)
CHLORIDE: 107 mmol/L (ref 98–107)
CO2: 24.8 mmol/L (ref 20.0–31.0)
CREATININE: 1.88 mg/dL — ABNORMAL HIGH
EGFR CKD-EPI (2021) MALE: 40 mL/min/{1.73_m2} — ABNORMAL LOW (ref >=60)
GLUCOSE RANDOM: 104 mg/dL (ref 70–179)
POTASSIUM: 5.4 mmol/L — ABNORMAL HIGH (ref 3.4–4.8)
PROTEIN TOTAL: 6 g/dL (ref 5.7–8.2)
SODIUM: 135 mmol/L (ref 135–145)

## 2021-10-23 LAB — PROTIME-INR
INR: 1.65
PROTIME: 19 s — ABNORMAL HIGH (ref 9.8–12.8)

## 2021-10-23 NOTE — Unmapped (Signed)
Pt stating he is on the liver transplant list at [facility]. Pt stating he was sent for labs, chest xray, and a paracentesis. Pt stating he has had diarrhea since the weekend and has abdominal edema. Pt stating he had a fall at the grocery store on Thursday.

## 2021-10-23 NOTE — Unmapped (Signed)
[facility]  Emergency Department Provider Note        ED Clinical Impression      Final diagnoses:   Dehydration (Primary)   AKI (acute kidney injury) (CMS-HCC)       Impression, Medical Decision Making, Progress Notes and Critical Care      Impression, Differential Diagnosis and Plan of Care    Matthew Key is a 61 y.o. male with a PMH of T2DM, OSA (on CPAP), DVT (not anticoagulated), HLD, HTN, cryptogenic cirrhosis (likely 2/2 NASH, c/b ascites and esophageal varices; currently transplant candidate), and CVA presenting to the ED for 3-4 days of non bloody diarrhea and overall times a day with incontinence, abdominal distention, and mild shortness of breath.     The patient is hemodynamically stable, afebrile, and satting 97% on RA.     Differential includes dehydration with possible AKI and diarrhea from possibly viral etiology/medication side effect/liver disease.  They were attempting to set up large-volume paracentesis outpatient for him but was unable to and he is now short of breath because he cannot take deep breaths.  Fortunately at this time he does not have any abdominal pain or discomfort.  He has been taking his Cipro at home for prophylaxis.    Plan for EKG, CXR, basic labs, PT-INR, UA, and RPP.     3:54 PM  CXR shows small right pleural effusion with adjacent right lower lobe collapse. Also shows trace left pleural effusion with minimal left basilar atelectasis.     6:15 PM: CMP shows AKI with Cr elevated to 1.88; hyperkalemia to 5.4. Patient paged for admission for further management and eventual LVP.     8:05 PM: Patient admitted to Med W.     ED Course as of 10/25/21 0719   Tue Oct 23, 2021   1519 XR Chest 2 views  IMPRESSION:  -Small right pleural effusion with adjacent right lower lobe collapse.  -Trace left pleural effusion with minimal left basilar atelectasis.   1616 Urinalysis with Microscopy with Culture Reflex:    Color, UA Colorless   Clarity, UA Clear   Spec Grav, UA 1.006   pH, UA 5.0   Leukocyte Esterase, UA Negative   Nitrite, UA Negative   Protein, UA Negative   Glucose, UA Negative   Ketones, UA Negative   Urobilinogen, UA <2.0 mg/dL   Bilirubin, UA Negative   Blood, UA Negative   RBC, UA <1   WBC, UA <1   Squam Epithel, UA <1   Bacteria, UA None Seen  Urinalysis unremarkable   1616 ECG 12 Lead  EKG sinus with no evidence of acute ischemia or arrhythmia.     1656 CBC w/ Differential(!):    WBC 4.8   RBC 2.95(!)   HGB 10.0(!)   HCT 29.6(!)   MCV 100.4(!)   MCH 34.0(!)   MCHC 33.9   RDW 17.0(!)   MPV 10.1   Platelet 59(!)   nRBC 0   Neutrophils % 45.0   Lymphocytes % 27.1   Monocytes % 22.7   Eosinophils % 4.5   Basophils % 0.7   Absolute Neutrophils 2.2   Absolute Lymphocytes 1.3   Absolute Monocytes  1.1(!)   Absolute Eosinophils 0.2   Absolute Basophils  0.0  CBC at baseline.  Platelets 59 with no evidence of bleeding.   1657 Comprehensive metabolic panel(!):    Sodium 135   Potassium 5.4(!)   Chloride 107   CO2 24.8  Anion Gap 3(!)   Bun 35(!)   Creatinine 1.88(!)   BUN/Creatinine Ratio 19   eGFR CKD-EPI (2021) Male 40(!)   Glucose 104   Calcium 9.0   Albumin 2.0(!)   Total Protein 6.0   Total Bilirubin 2.9(!)   AST 54(!)   ALT 26   Alkaline Phosphatase 215(!)  Patient hyperkalemic 5.4 with AKI and creatinine 1.88 probably in setting of ongoing diarrhea.   1657 We will give 1 L LR and reassess.   1702 At this time patient clinically dehydrated and having ongoing watery nonbloody diarrhea that he is incontinent of.  Also needs large-volume paracentesis.  No concern for SBP at this time.  Will page MAO for admission.   2007 Spoke with admitting team.  Stable at time admission.       Independent Interpretation of Studies    I have independently interpreted the following studies:  ??? EKG: Personal interpretation as above  ??? X-ray(s): Chest x-ray with no focal opacity to suggest pneumonia.  Resulted at 1330: Show CXR shows small right pleural effusion with adjacent right lower lobe collapse. Also shows trace left pleural effusion with minimal left basilar atelectasis.     Discussion of Management with other Providers or Support Staff    I discussed the management of this patient with the:  Admitting provider: Discussed plan of care with admitting provider  Considerations Regarding Disposition/Escalation of Care    ??? Patient/Family/Caregiver Discussions: Discussed plan of care with wife at bedside  ??? Diagnostic Tests Considered But Not Done: Considered CT imaging of the chest but not indicated  ??? Prescription Drugs Provided or Considered But Not Given: Considered further pain medications but not required  ??? Social Determinants of Health which significantly affected care: Patient with significant liver disease complicating his daily life.    Portions of this record have been created using Scientist, clinical (histocompatibility and immunogenetics). Dictation errors have been sought, but may not have been identified and corrected.    ____________________________________________         History        Reason for Visit  Diarrhea      HPI   Matthew Key is a 61 y.o. male with a PMH of T2DM, OSA (on CPAP), DVT (not anticoagulated), HLD, HTN, cryptogenic cirrhosis (likely 2/2 NASH, c/b ascites and esophageal varices; currently transplant candidate), and CVA presenting to the ED for diarrhea. Patient reports 3-4 days of non bloody diarrhea, abdominal distention, and mild shortness of breath. He has had several episodes of diarrhea each day.  He has been incontinent of stool.  Patient has not tried any OTC anti diarrheals thus far. He denies fever.  He does not have any abdominal pain like his prior episodes of SBP.  Has not had any confusion.  Has been lightheaded while walking around.    Per chart review, patient presented to [facility] on 3/02 for evaluation of abdominal pain, nausea, and hepatic encephalopathy in the setting of recent hospitalization for SBP (1/14 - 1/20) and compliance with prophylactic cipro. In the ED, he underwent diagnostic tap, received dose of ceftriaxone, and was subsequently admitted. During admission, his encephalopathy, abdominal pain, and nausea resolved. His first set of blood cultures were negative. Thus, his empiric SBP treatment was discontinued after one dose and he was transitioned back to his cipro. Patient requested discharge on 3/05 as his father in law passed away. He was hemodynamically stable at the time, and he was sent home with OP follow up scheduled  for 4/17.     Outside Historian(s)  Obtained additional history from family member at bedside  External Records Reviewed  Discharge Summary 10/14/21    Past Medical History:   Diagnosis Date   ??? AKI (acute kidney injury) (CMS-HCC) 12/14/2020   ??? Anxiety 10/22/2013   ??? Arthritis    ??? Cervical radiculopathy 12/03/2016   ??? Chronic pain disorder     Lower back   ??? Cirrhosis (CMS-HCC)    ??? Dental abscess 10/2020   ??? Duodenal ulcer 12/01/2017   ??? GERD (gastroesophageal reflux disease)    ??? History of transfusion    ??? Hyperlipidemia 10/22/2013   ??? Hypertension     under control with meds and weight loss   ??? Liver disease    ??? Sleep apnea, obstructive     Have machine   ??? Stroke (CMS-HCC)     mild stroke   ??? Type 2 diabetes mellitus with diabetic neuropathy, with long-term current use of insulin (CMS-HCC) 06/09/2014       Patient Active Problem List   Diagnosis   ??? Hyponatremia   ??? Leukocytosis   ??? Non-alcoholic micronodular cirrhosis of liver (CMS-HCC)   ??? Pleural effusion, right   ??? Primary insomnia   ??? OSA on CPAP   ??? Thrombocytopenia, unspecified (CMS-HCC)   ??? Type 2 diabetes mellitus without complication (CMS-HCC)   ??? Spontaneous bacterial peritonitis (CMS-HCC)   ??? Anemia   ??? Left upper extremity swelling   ??? Adrenal insufficiency (CMS-HCC)   ??? Acute deep vein thrombosis (DVT) of brachial vein of left upper extremity (CMS-HCC)   ??? Acute thrombosis of right basilic vein   ??? Hypofibrinogenemia (CMS-HCC)   ??? Abdominal ascites   ??? Pleural effusion associated with hepatic disorder   ??? Essential hypertension   ??? Acute medial meniscus tear of right knee   ??? AKI (acute kidney injury) (CMS-HCC)   ??? Anxiety   ??? Cerebrovascular accident (CVA) (CMS-HCC)   ??? Cervical radicular pain   ??? Cervical radiculopathy   ??? Chronic bilateral low back pain without sciatica   ??? Neck pain, chronic   ??? COVID-19   ??? De Quervain's tenosynovitis, left   ??? Duodenal ulcer   ??? Elevated BUN   ??? Elevated liver enzymes   ??? Erectile dysfunction   ??? Esophageal varices determined by endoscopy (CMS-HCC)   ??? Fatty liver   ??? Gastroesophageal reflux disease   ??? History of stroke without residual deficits   ??? Hospital discharge follow-up   ??? Hyperlipidemia   ??? Lactic acidosis   ??? Obesity (BMI 30-39.9)   ??? Right shoulder pain   ??? Right upper quadrant abdominal pain   ??? Tubular adenoma of colon   ??? Water retention   ??? Hypertension associated with diabetes (CMS-HCC)   ??? Acute metabolic encephalopathy   ??? Decompensated hepatic cirrhosis (CMS-HCC)     Past Surgical History:   Procedure Laterality Date   ??? KNEE SURGERY     ??? PR CATH PLACE/CORON ANGIO, IMG SUPER/INTERP,R&L HRT CATH, L HRT VENTRIC N/A 12/19/2020    Procedure: CATH LEFT/RIGHT HEART CATHETERIZATION;  Surgeon: Rosana Hoes, MD;  Location: [facility] CATH;  Service: Cardiology   ??? PR UPPER GI ENDOSCOPY,BIOPSY N/A 10/12/2020    Procedure: UGI ENDOSCOPY; WITH BIOPSY, SINGLE OR MULTIPLE;  Surgeon: Marene Lenz, MD;  [facility];  Service: Gastroenterology   ??? ROOT CANAL      Front teeth  Current Facility-Administered Medications:   ???  acetaminophen (TYLENOL) tablet 500 mg, 500 mg, Oral, Q6H PRN, Arlean Hopping, MD, 500 mg at 10/23/21 2341  ???  atorvastatin (LIPITOR) tablet 10 mg, 10 mg, Oral, Nightly, Arlean Hopping, MD, 10 mg at 10/24/21 2016  ???  ciprofloxacin HCl (CIPRO) tablet 500 mg, 500 mg, Oral, Q24H SCH, Arlean Hopping, MD, 500 mg at 10/24/21 0919  ???  dextrose (D10W) 10% bolus 125 mL, 12.5 g, Intravenous, Q10 Min PRN, Arlean Hopping, MD  ???  gabapentin (NEURONTIN) capsule 400 mg, 400 mg, Oral, BID, Yehuda Savannah, MD, 400 mg at 10/24/21 2016  ???  glucagon injection 1 mg, 1 mg, Intramuscular, Once PRN, Arlean Hopping, MD  ???  glucose chewable tablet 16 g, 16 g, Oral, Q10 Min PRN, Arlean Hopping, MD  ???  hydrOXYzine (ATARAX) tablet 50 mg, 50 mg, Oral, Nightly PRN, Yehuda Savannah, MD  ???  insulin lispro (HumaLOG) injection 0-6 Units, 0-6 Units, Subcutaneous, ACHS, Arlean Hopping, MD  ???  lactulose oral solution, 20 g, Oral, BID, Christena Flake, MD, 20 g at 10/24/21 2017  ???  pantoprazole (PROTONIX) EC tablet 40 mg, 40 mg, Oral, Daily, Arlean Hopping, MD, 40 mg at 10/24/21 0919  ???  rifAXIMin (XIFAXAN) tablet 550 mg, 550 mg, Oral, BID, Arlean Hopping, MD, 550 mg at 10/24/21 2016    Allergies  Venom-honey bee    Family History   Problem Relation Age of Onset   ??? Edema Mother    ??? Alzheimer's disease Father    ??? Aortic dissection Brother    ??? Early death Brother    ??? Aneurysm Brother      Social History  Social History     Tobacco Use   ??? Smoking status: Never   ??? Smokeless tobacco: Never   Vaping Use   ??? Vaping Use: Never used   Substance Use Topics   ??? Alcohol use: Not Currently   ??? Drug use: Not Currently     Review of Systems    As above otherwise unremarkable     Physical Exam       ED Triage Vitals [10/23/21 1257]   Enc Vitals Group      BP 101/49      Heart Rate 65      SpO2 Pulse       Resp 18      Temp 36.2 ??C (97.2 ??F)      Temp Source Oral      SpO2 97 %      Weight       Height      Constitutional: Alert and oriented.  Tired appearing and in no distress.  Eyes: Conjunctivae are normal.  ENT       Head: Normocephalic and atraumatic.       Nose: No congestion.       Mouth/Throat: Mucous membranes are moist.       Neck: No stridor. No meningismus.  Cardiovascular: Normal rate, regular rhythm. Normal and symmetric distal pulses are present in all extremities.  Respiratory: Normal respiratory effort.  Decreased breath sounds in the bases.  No rales or rhonchi.  Gastrointestinal: Abdomen tense but non tender. No rebound or guarding. There is no CVA tenderness.  Musculoskeletal: 1+ edema in BLEs. Normal range of motion in all extremities.       Right lower leg: No tenderness.       Left lower leg: No tenderness.  Neurologic: Normal speech and language. No gross focal neurologic deficits are appreciated.  Skin: Skin is warm, dry and intact. No rash noted.  Psychiatric: Mood and affect are normal. Speech and behavior are normal.     Radiology     XR Chest 2 views   Final Result   -Small right pleural effusion with adjacent right lower lobe collapse.   -Trace left pleural effusion with minimal left basilar atelectasis.        Attestations     Documentation assistance was provided by Lenward Chancellor, Scribe on October 23, 2021 at 3:20 PM for [place] Shaquina Gillham, MD.     Documentation assistance provided by the scribe. I was present during the time the encounter was recorded. The information recorded by the scribe was done at my direction and has been reviewed and validated by me.            [place] Christopher Arlenne Kimbley, MD  Resident  10/25/21 [phone]

## 2021-10-23 NOTE — Unmapped (Signed)
Yesterday chest Xray and IR orders placed for LVP and possible thora with urgent request to schedule.  Pt called this morning and states he is feeling poorly, having difficulty breathing and ongoing SOB. Pt plans to go to [facility] for eval. [name] made aware. Pt has phone appt with CSW at 11a. TNC contacted her to make aware pt is going to ER and to reschedule next week (3/22 @ 11a).  Pt has discussed caregiving needs with his sister and son and states they are on board to support him after transplant.

## 2021-10-24 LAB — BASIC METABOLIC PANEL
ANION GAP: 5 mmol/L (ref 5–14)
BLOOD UREA NITROGEN: 26 mg/dL — ABNORMAL HIGH (ref 9–23)
BUN / CREAT RATIO: 16
CALCIUM: 8.5 mg/dL — ABNORMAL LOW (ref 8.7–10.4)
CHLORIDE: 106 mmol/L (ref 98–107)
CO2: 26 mmol/L (ref 20.0–31.0)
CREATININE: 1.63 mg/dL — ABNORMAL HIGH
EGFR CKD-EPI (2021) MALE: 48 mL/min/{1.73_m2} — ABNORMAL LOW (ref >=60)
GLUCOSE RANDOM: 86 mg/dL (ref 70–179)
POTASSIUM: 4.9 mmol/L — ABNORMAL HIGH (ref 3.4–4.8)
SODIUM: 137 mmol/L (ref 135–145)

## 2021-10-24 LAB — B-TYPE NATRIURETIC PEPTIDE: B-TYPE NATRIURETIC PEPTIDE: 65 pg/mL (ref <=100)

## 2021-10-24 LAB — CBC
HEMATOCRIT: 24.1 % — ABNORMAL LOW (ref 39.0–48.0)
HEMOGLOBIN: 8.4 g/dL — ABNORMAL LOW (ref 12.9–16.5)
MEAN CORPUSCULAR HEMOGLOBIN CONC: 35 g/dL (ref 32.0–36.0)
MEAN CORPUSCULAR HEMOGLOBIN: 34.8 pg — ABNORMAL HIGH (ref 25.9–32.4)
MEAN CORPUSCULAR VOLUME: 99.5 fL — ABNORMAL HIGH (ref 77.6–95.7)
MEAN PLATELET VOLUME: 10.5 fL (ref 6.8–10.7)
PLATELET COUNT: 50 10*9/L — ABNORMAL LOW (ref 150–450)
RED BLOOD CELL COUNT: 2.42 10*12/L — ABNORMAL LOW (ref 4.26–5.60)
RED CELL DISTRIBUTION WIDTH: 17.2 % — ABNORMAL HIGH (ref 12.2–15.2)
WBC ADJUSTED: 3.2 10*9/L — ABNORMAL LOW (ref 3.6–11.2)

## 2021-10-24 LAB — GLUCOSE, BODY FLUID: GLUCOSE FLUID: 172 mg/dL

## 2021-10-24 LAB — LIPASE: LIPASE: 92 U/L — ABNORMAL HIGH (ref 12–53)

## 2021-10-24 LAB — MAGNESIUM: MAGNESIUM: 1.8 mg/dL (ref 1.6–2.6)

## 2021-10-24 MED ADMIN — acetaminophen (TYLENOL) tablet 500 mg: 500 mg | ORAL | @ 04:00:00

## 2021-10-24 MED ADMIN — pantoprazole (PROTONIX) EC tablet 40 mg: 40 mg | ORAL | @ 13:00:00

## 2021-10-24 MED ADMIN — ciprofloxacin HCl (CIPRO) tablet 500 mg: 500 mg | ORAL | @ 13:00:00 | Stop: 2021-11-23

## 2021-10-24 MED ADMIN — albumin human 25 % bottle 50 g: 50 g | INTRAVENOUS | @ 14:00:00 | Stop: 2021-10-24

## 2021-10-24 MED ADMIN — albumin human 25 % bottle 50 g: 50 g | INTRAVENOUS | @ 04:00:00 | Stop: 2021-10-24

## 2021-10-24 MED ADMIN — rifAXIMin (XIFAXAN) tablet 550 mg: 550 mg | ORAL | @ 13:00:00 | Stop: 2021-11-23

## 2021-10-24 NOTE — Unmapped (Signed)
Diagnostic Paracentesis Procedure Note (CPT [phone])    Pre-procedural Planning     Patient Name:: Matthew Key  Patient MRN: [id number]    Indications:  Ascites with abdominal pain    Known Bleeding Diathesis: Patient/caregiver denies any known bleeding or platelet disorder.     Antiplatelet Agents: This patient is not on an antiplatelet agent.    Systemic Anticoagulation: This patient is not on full systemic anticoagulation.    Significant Labs:  INR   Date Value Ref Range Status   10/23/2021 1.65  Final   09/26/2021 1.6 (H) 0.9 - 1.2 Final     Comment:     Reference interval is for non-anticoagulated patients.  Suggested INR therapeutic range for Vitamin K  antagonist therapy:     Standard Dose (moderate intensity                    therapeutic range):       2.0 - 3.0     Higher intensity therapeutic range       2.5 - 3.5       PT   Date Value Ref Range Status   10/23/2021 19.0 (H) 9.8 - 12.8 sec Final     APTT   Date Value Ref Range Status   08/25/2021 39.2 (H) 25.1 - 36.5 sec Final     Platelet   Date Value Ref Range Status   10/24/2021 50 (L) 150 - 450 10*9/L Final   07/09/2021 57 (LL) 150 - 450 x10E3/uL Final       Consent: Informed consent was obtained after explanation of the risks (including bleeding, infection, bowel perforation, and leaking) and benefits of the procedure. Refer to the consent documentation.    Procedure Details     Time-out was performed immediately prior to the procedure.    The head of the bed was placed at 45 degrees above level and a large area of ascitic fluid was identified by ultrasound in the left lower quadrant.  The linear probe with color doppler was used to ensure that a vessel was not located in the proposed needle path.    Skin was cleaned with Chlorhexidine. Local anesthesia with 1% lidocaine was introduced subcutaneously then deep to the skin until the parietal peritoneum was anesthetized. A 8 g straight needle was introduced into this site until ascitic fluid was encountered.  Ascitic fluid and the needle were removed with minimal bleeding.  A sterile bandage was placed after holding pressure.     Findings     30 mL of clear and yellow ascites fluid was obtained.    The ascites fluid was sent for Cell count and diff, Cytology, Culture, Albumin, Total Protein and LDH.    Condition     The patient tolerated the procedure well and remains in the same condition as pre-procedure.    Complications     Complications:  None; patient tolerated the procedure well.      Requesting Service: Med Candie Echevaria (MDW)    Time Requested: 1100   Time Completed: 1130    Resident(s) Performing Procedure: Norvel Richards  Resident Year: PGY2

## 2021-10-24 NOTE — Unmapped (Signed)
Internal Medicine (MEDW) Progress Note    Assessment & Plan:   Matthew Key is a 61 y.o. 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    Principal Problem:    Nausea vomiting and diarrhea  Active Problems:    Non-alcoholic micronodular cirrhosis of liver (CMS-HCC)    OSA on CPAP    Thrombocytopenia, unspecified (CMS-HCC)    Type 2 diabetes mellitus without complication (CMS-HCC)    Anemia    Abdominal ascites    Essential hypertension    AKI (acute kidney injury) (CMS-HCC)    Esophageal varices determined by endoscopy (CMS-HCC)    Gastroesophageal reflux disease    Hyperlipidemia  Resolved Problems:    * No resolved hospital problems. *      Nausea, Vomiting, Diarrhea - Hypotension  3-4d of N/V/D. Imodium helped some, believes this is at least in part 2/2 rifaximin in his experience. Lipase elevated, can't r/o pancreatitis but less likely at this time. Infectious workup pending. Hypotensive this morning as low as DBP 27, watching closely and encouraging PO intake. Diagnostic paracentesis today for SBP concern.   - F/u GIPP and C. Diff  - F/u RPP  - F/u lipase to r/o pancreatitis  - Hold Imodium for now until infection r/o  - Zofran PRN   - MedM consulted for para today   - Consider LVP as AKI resolves if still here, could do outpt if stable     AKI - Hyperkalemia   CMP shows Cr of 1.88 -> 1.63. Baseline Cr appears to be ~0.9-1.0. Suspect pre-renal iso N/V and diarrhea as above + third spacing in belly. S/p 50g albumin 25% from overnight, repeat today given hypotension as above. Repeat 100g tomorrow for full HRS albumin challenge. Hyperkalemia better at 4.9 this AM.  - Albumin 25%, 50 grams   - Daily CBC, BMP, Mg, Phos  - Hold home lisinopril, furosemide, and spironolactone  - Hold coreg  ??  Acute decompensated Cryptogenic Cirrhosis  Hx of Esophageal Varices   History of ascites, SBP (08/2021), HE, nonbleeding esophageal varices (last EGD 10/2020). Actively listed for a liver transplant. Is adherent to cirpofloxacin PPx, reports intermittent adherence to rifaximin as it gives him bad gas. Abdomen distended on exam, not currently encephalopathic.  - Hepatology consulted, appreciate recs  - MedM diagnostic paracentesis  - Trend MELD labs  - Hold lactulose iso diarrhea, anticipate restarting tomorrow  - rifaximin BID  - Continue ppx Ciprofloxacin  - Anticipate LVP once AKI resolved   ??  Pleural Effusion - Atelectasis  Concern for wheezing at home from wife, breathing comfortably on RA this morning. BNP normal. Could consider bubble echo if SOB persists and there's c/f HPS. RPP negative.  - Monitor O2 sat on RA  - Incentive spirometer   - Daily CBC     Recent Fall  Had a fall 3/7 while out at the grocery store. He bent down to grab his hat that fell and whenever he stood up, fell to the ground hitting his head. Later presented to [facility] where [facility] and C-spine were negative. Uses cane to ambulate at baseline.   -PT/OT  ??  Chronic Problems:  HTN: Hold furosemide, lisinopril, spironolactone iso AKI.   HLD: Continue  atorvastatin. Trend LFTs  T2DM: Most recent A1C 5.8 on 08/2021. Start SSI. 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  Insomina: Can consider atarax PRN for  sleep    Daily Checklist:  Diet: Sodium Restricted (2g)  DVT PPx: SCDs -- Platelets 50  Electrolytes: Replete Potassium to >/= 3.6 and Magnesium to >/= 1.8  Code Status: Full Code  Dispo: Home    Team Contact Information:   Primary Team: Internal Medicine (MEDW)  Primary Resident: Kathrynn Ducking, MS4  Resident's Pager: [phone]    Interval History:   No acute events overnight. Had an episode of lightheadedness while working with PT, BP as low as 81/27, pt states this is lower than his pressure has ever been. Feeling better after another bolus of albumin.     Not feeling confused, denies ongoing N/V, no diarrhea since transfer, no subjective shortness of breath.     Objective:   Temp:  [36.2 ??C (97.2 ??F)-37 ??C (98.6 ??F)] 37 ??C (98.6 ??F)  Heart Rate:  [60-79] 76  Resp:  [18] 18  BP: (80-119)/(27-57) 81/27  SpO2:  [97 %-100 %] 100 %    Gen: resting comfortably in bed in NAD, very pleasant, answers questions appropriately  Eyes: sclera anicteric, EOMI, PERRL  HENT: atraumatic, MMM  Heart: RRR, S1, S2, systolic murmur heard at upper sternal border with radiation to bilateral carotids, no chest wall tenderness  Lungs: CTAB, no crackles or wheezes, somewhat diminished breath sounds at bases, normal WOB on RA  Abdomen: Normoactive bowel sounds, soft, distended but nontender, no rebound/guarding  Extremities: no clubbing, cyanosis, or edema in the BLEs  Psych: Alert, oriented, appropriate mood and affect    Labs/Studies: Labs and Studies from the last 24hrs per EMR and Reviewed    Kathrynn Ducking, MS4  October 24, 2021 7:18 AM    I attest that I have reviewed the student note and that the components of the history of the present illness, the physical exam, and the assessment and plan documented were performed by me or were performed in my presence by the student where I verified the documentation and performed (or re-performed) the exam and medical decision making.   Wenda Overland, MD    Internal Medicine & Pediatrics, PGY-2  [facility]  Pager [phone]

## 2021-10-24 NOTE — Unmapped (Signed)
Internal Medicine (MEDW) History & Physical    Assessment & Plan:   Matthew Key is a 61 y.o. 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    Principal Problem:    Nausea vomiting and diarrhea  Active Problems:    Non-alcoholic micronodular cirrhosis of liver (CMS-HCC)    OSA on CPAP    Thrombocytopenia, unspecified (CMS-HCC)    Type 2 diabetes mellitus without complication (CMS-HCC)    Anemia    Abdominal ascites    Essential hypertension    AKI (acute kidney injury) (CMS-HCC)    Esophageal varices determined by endoscopy (CMS-HCC)    Gastroesophageal reflux disease    Hyperlipidemia  Resolved Problems:    * No resolved hospital problems. *    Nausea, Vomiting, Diarrhea  Patient presents with N/V and diarrhea that started 4 days ago. Reports loose stools 7-11 times per day during this time period (baseline 3 stools/day) with associated incontinence. He has been continuing to take his lactulose and rifaximin during this time. Tried Imodium for the first time 3/13 which helped some what though symptoms not fully resolved. Denies sick contacts or associated abdominal pain, fevers, chills. Denies hematemesis or bloody diarrhea. On exam, no abdominal pain. Patient afebrile and WBC wnl. Ddx includes possible viral illness, gastroenteritis vs C. Diff infection given recent hospitalization and Abx use vs pancreatitis.   -F/u GIPP and C. Diff  -F/u RPP  -F/u lipase to r/o pancreatitis  -Hold Imodium for now until infection r/o  -Zofran PRN      AKI - Hyperkalemia   CMP shows Cr of 1.88 and hyperkalemia of 5.4. Baseline Cr appears to be ~0.9-1.0. Suspect pre-renal iso N/V and diarrhea as above. Per ED notes, ordered 1 L of LR though do not see that it was administered.   - Albumin 25%, 50 grams to start and monitor respiratory status (reports dyspnea). Can repeat dose to achieve 1g/kg albumin challenge; will start with albumin instead of IVF to limit third spacing as possible   - Trend BMP  - Hold home lisinopril, furosemide, and spironolactone  - Hold coreg for now     Acute decompensated Cryptogenic Cirrhosis  Hx of Esophageal Varices   History of ascites, SBP (08/2021), HE, nonbleeding esophageal varices (last EGD 10/2020). Actively listed for a liver transplant. Is adherent to cirpofloxacin PPx. No asterixis on exam. C/o increased abdominal distention despite taking his prescribed home lasix and spironolactone and adequate urine output daily. Reports 20 lb weight gain since most recent discharge from the hospital on 3/5, where he presented with Hepatic Encephalopathy. Plan was to undergo OP LVP. Per patient, has not gotten a large volume paracentesis in approximately 2 years. Do not suspect SBP given patient is afebrile, WBC is wnl, no abdominal pain, AMS, or hemodynamic instability.   MELD-Na score: 24 at 10/23/2021  4:15 PM  MELD score: 23 at 10/23/2021  4:15 PM  Calculated from:  Serum Creatinine: 1.88 mg/dL at 8/41/3244  0:10 PM  Serum Sodium: 135 mmol/L at 10/23/2021  4:15 PM  Total Bilirubin: 2.9 mg/dL at 2/72/5366  4:40 PM  INR(ratio): 1.65 at 10/23/2021  4:15 PM  Age: 61 years  - Consider Hepatology c/s  - Trend labs as above  -  Hold lactulose iso diarrhea (>3-5 BM at this time)  - rifaximin BID  - Hold carvedilol  - Continue ppx Ciprofloxacin  - Anticipate LVP once AKI resolved     Pleural Effusion - Atelectasis  On admission has mild shortness of breath (3/14) though stable on room air. Patient has a previous diagnosis of moderate pulmonary HTN diagnosed via RHC with a mean PA of 46. Chest XR evidence of right sided and trace left sided pleural effusions along with left sided atelectasis. Most recent echo 02/2021 with EF 55-60%, mild AR, LA mildly to moderate dilation, RV normal in size with normal systolic function  - Monitor O2 sat on RA  - Incentive spirometer   - Trend labs   - F/u RPP   - F/u BNP; consider ordering echo pending results     Recent Fall  Had a fall 3/7 while out at the grocery store. He bent down to grab his hat that fell and whenever he stood up, fell to the ground hitting his head. Later presented to [facility] where [facility] and Cervical spine were negative. Uses cane to ambulate at baseline.   -PT/OT    Chronic Problems:  HTN: Hold furosemide, lisinopril, spironolactone iso AKI.   HLD: Continue atorvastatin. Trend LFTs  T2DM: Most recent A1C 5.8 on 08/2021. Start SSI. 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  Insomina: Can consider atarax PRN for  sleep      Daily Checklist:  Diet: Regular Diet; Na restricted  DVT PPx: Lovenox   Electrolytes: Replete Potassium to >/= 3.6 and Magnesium to >/= 1.8  Code Status: Full Code    Chief Concern:   Non bloody diarrhea with incontinence, abdominal distention, dehydration, and mild shortness of breath    Subjective:   HPI:  Matthew Key is a 61 y.o. 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    Patient presents with N/V and diarrhea that started 4 days ago. Reports loose stools 7-11 times per day during this time period (baseline 3 stools/day) with associated incontinence. He has been continuing to take his lactulose and rifaximin during this time. Tried Imodium for the first time yesterday which helped some what though symptoms not fully resolved. Denies sick contacts or associated abdominal pain, fevers, chills. Denies hematemesis or bloody diarrhea.    Patient also c/o increased abdominal distention despite taking his prescribed home lasix and spironolactone and adequate urine output daily. Reports 20 lb weight gain since most recent discharge from the hospital on 3/5, where he presented with Hepatic Encephalopathy. Since discharge, he did have a fall 3/7 while out at the grocery store. He bent down to grab his hat that fell and whenever he stood up, fell to the ground hitting his head. Later presented to [facility] where [facility] and Cervical spine were negative. Plan was to undergo OP LVP. Per patient, has not gotten a large volume paracentesis in approximately 2 years. Fluid managed with diuretics.        Lives at home with his wife. Ambulates using a cane. Denies tobacco, alcohol, illicit drug use.  Designated Environmental health practitioner:  Matthew Key currently has decisional capacity for healthcare decision-making and is able to designate a surrogate healthcare decision maker. Matthew Key designated healthcare decision maker(s) is/are Matthew Key (the patient's spouse) as denoted by patient preference    Allergies:  Venom-honey bee    Medications:   Prior to Admission medications    Medication Dose, Key, Frequency   acetaminophen (TYLENOL) 325 MG tablet 650 mg, Oral, Every 6 hours PRN   atorvastatin (LIPITOR) 10 MG tablet 10 mg, Oral, Nightly   carvediloL (COREG) 3.125 MG tablet 3.125 mg, Oral, 2 times a day (standard)   ciprofloxacin HCl (CIPRO) 500 MG tablet TAKE 1 TABLET BY MOUTH IN THE MORNING   lactulose 10 gram/15 mL solution 20 g, Oral, 3 times a day (standard)   flash glucose sensor kit 1 each, Other, Every 14 days, Insert 1 sensor SQ q14 days; give #2 (30 day supply) or #6 (90 day supply) per pt preference   furosemide (LASIX) 20 MG tablet 40 mg, Oral, Daily (standard)   gabapentin (NEURONTIN) 400 MG capsule 400 mg, Oral, 2 times a day   hydrOXYzine (VISTARIL) 50 MG capsule No dose, Key, or frequency recorded.   insulin syringe-needle U-100 1 mL 31 gauge x 5/16 (8 mm) Syrg Use once as needed for Hydrocortisone injection.Marland Kitchen   lisinopriL (PRINIVIL,ZESTRIL) 2.5 MG tablet 2.5 mg, Oral, Daily (standard)   ondansetron (ZOFRAN-ODT) 4 MG disintegrating tablet TAKE 1 TABLET BY MOUTH EVERY 6 HOURS FOR 7 DAYS   pantoprazole (PROTONIX) 40 MG tablet 40 mg, Oral, Daily (standard)   pen needle, diabetic (BD ULTRA-FINE NANO PEN NEEDLE) 32 gauge x 5/32 (4 mm) Ndle ok to sub any brand or size needle preferred by insurance/patient, use 1-2x/day, dx E11.65   rifAXIMin (XIFAXAN) 550 mg Tab 550 mg, Oral, 2 times a day (standard)   semaglutide (OZEMPIC) 1 mg/dose (4 mg/3 mL) PnIj injection 1 mg, Subcutaneous, Every 7 days   sodium chloride (AYR) 0.65 % Drop 1 spray   spironolactone (ALDACTONE) 25 MG tablet 50 mg, Oral, Daily (standard)   syringe with needle 3 mL 23 x 1 Syrg Use as needed for Hydrocortisone injection   TRESIBA FLEXTOUCH U-100 100 unit/mL (3 mL) InPn 30 Units, Subcutaneous, At bedtime, Adjust as instructed.       Medical History:  Past Medical History:   Diagnosis Date   ??? AKI (acute kidney injury) (CMS-HCC) 12/14/2020   ??? Anxiety 10/22/2013   ??? Arthritis    ??? Cervical radiculopathy 12/03/2016   ??? Chronic pain disorder     Lower back   ??? Cirrhosis (CMS-HCC)    ??? Dental abscess 10/2020   ??? Duodenal ulcer 12/01/2017   ??? GERD (gastroesophageal reflux disease)    ??? History of transfusion    ??? Hyperlipidemia 10/22/2013   ??? Hypertension     under control with meds and weight loss   ??? Liver disease    ??? Sleep apnea, obstructive     Have machine   ??? Stroke (CMS-HCC)     mild stroke   ??? Type 2 diabetes mellitus with diabetic neuropathy, with long-term current use of insulin (CMS-HCC) 06/09/2014       Surgical History:  Past Surgical History:   Procedure Laterality Date   ??? KNEE SURGERY     ??? PR CATH PLACE/CORON ANGIO, IMG SUPER/INTERP,R&L HRT CATH, L HRT VENTRIC N/A 12/19/2020    Procedure: CATH LEFT/RIGHT HEART CATHETERIZATION;  Surgeon: Rosana Hoes, MD;  Location:  [facility] CATH;  Service: Cardiology   ??? PR UPPER GI ENDOSCOPY,BIOPSY N/A 10/12/2020    Procedure: UGI ENDOSCOPY; WITH BIOPSY, SINGLE OR MULTIPLE;  Surgeon: Marene Lenz, MD;  [facility];  Service: Gastroenterology   ??? ROOT CANAL      Front teeth       Family History:   Family History   Problem Relation Age of Onset   ??? Edema Mother    ??? Alzheimer's disease Father    ??? Aortic dissection Brother    ??? Early death Brother    ??? Aneurysm Brother        Social History:  The patient lives with family    Social History     Tobacco Use   ??? Smoking status: Never   ??? Smokeless tobacco: Never   Vaping Use   ??? Vaping Use: Never used   Substance Use Topics   ??? Alcohol use: Not Currently   ??? Drug use: Not Currently        Review of Systems:  10 systems were reviewed and are negative unless otherwise mentioned in the HPI    Objective:   Physical Exam:  Temp:  [36.2 ??C (97.2 ??F)] 36.2 ??C (97.2 ??F)  Heart Rate:  [60-65] 60  Resp:  [18] 18  BP: (101-110)/(49-54) 110/54  SpO2:  [97 %] 97 %    Gen: Appears slightly fatigued, answers questions appropriately  Eyes: Sclera anicteric, EOMI, PERRLA,  HENT: atraumatic, normocephalic, MMM. OP w/o erythema or exudate   Neck: no cervical lymphadenopathy or thyromegaly, no JVD  Heart: RRR, S1, S2, no M/R/G, no chest wall tenderness  Lungs: CTAB, decreased breath sounds at the bases, no wheezes, no use of accessory muscles, normal WOB on RA  Abdomen: Normoactive bowel sounds, soft, NTND, no rebound/guarding.  Distended abdomen with fluid wave  Extremities: no clubbing, or cyanosis, +1 BLE edema: pulses are +2 in bilateral upper and lower extremities. No asterixis   Neuro: CN II-XI grossly intact. No focal deficits.  Skin:  No rashes, lesions on clothed exam  Psych: Alert, oriented, normal mood and affect.     Labs/Studies/Imaging:  Labs, Studies, Imaging from the last 24hrs per EMR and personally reviewed      Nelda Severe, MS3    I attest that I have reviewed the student note and that the components of the history of the present illness, the physical exam, and the assessment and plan documented were performed by me or were performed in my presence by the student where I verified the documentation and performed (or re-performed) the exam and medical decision making.    Amada Jupiter, MD  Internal Medicine PGY 2

## 2021-10-24 NOTE — Unmapped (Signed)
Medicine Procedure Service - New Procedure Request Triage    Procedure Requested: Diagnostic paracentesis  Indication: Rule out SBP  Urgency: ASAP    Questions for all procedures    1. Is the patient able to give consent for this procedure? - Yes  2. If no, has family to give consent been identified? - NA  3. Safe to give peri-procedural anxiolysis? (i.e, lorazepam 1 mg IV; if so, team or M can order) - No  4. Has team placed med procedure consult order? (If not, please remind team to do so) - Yes  5. Is patient currently on anticoagulation? Enoxaparin DVT ppx    INR   Date Value Ref Range Status   10/23/2021 1.65  Final   09/26/2021 1.6 (H) 0.9 - 1.2 Final     Comment:     Reference interval is for non-anticoagulated patients.  Suggested INR therapeutic range for Vitamin K  antagonist therapy:     Standard Dose (moderate intensity                    therapeutic range):       2.0 - 3.0     Higher intensity therapeutic range       2.5 - 3.5       PT   Date Value Ref Range Status   10/23/2021 19.0 (H) 9.8 - 12.8 sec Final     APTT   Date Value Ref Range Status   08/25/2021 39.2 (H) 25.1 - 36.5 sec Final     Platelet   Date Value Ref Range Status   10/24/2021 50 (L) 150 - 450 10*9/L Final   07/09/2021 57 (LL) 150 - 450 x10E3/uL Final         Diagnostic Paracentesis    Has team ordered studies? Yes  Are any studies other than routine rule-out-SBP labs needed (such as cytology or AFB culture)? No        Medicine Procedure Service  Guidelines for Peri-procedural Management of Bleeding Risk   Low Bleeding Risk  Paracentesis  Thoracentesis  I&D  CVC  Arthrocentesis   Moderate-High Bleeding Risk  Lumbar puncture*   INR <2-3, N/a for cirrhosis <2   Platelet >20 >50        ASA, low dose Not required to hold Do not hold   ASA, high dose Not required to hold Stop 5 days before   Clopidogrel Not required to hold Stop 5 days before   Ticagrelor Not required to hold Stop 5 days before   Prasugrel Not required to hold Stop 7 days before        UFH Not required to hold Stop 2-4 hours before   LMWH (prophylactic) Not required to hold Stop 12 hours before   LMWH (treatment) Not required to hold Stop 24 hours before        Warfarin INR < 3 Stop until INR < 1.8, consider 4 factor prothrombin complex concentrate with heme consult   Apixaban (Eliquis), BID Not required to hold Hold 4 doses (GFR > 50) or 6 doses (GFR <30-50). If urgent, see table below and consider heme consult.   Dabigatran (Pradaxa), BID Not require to hold Hold 5-6 doses (GFR > 50) or 7-8 doses (GFR < 30-50).  If urgent, see table below and consider heme consult.   Rivaroxaban (Xarelto), maintenance once daily Not required to hold Hold for 2 doses (GFR > 30) or 3 doses (GFR < 30).  If urgent, see table  below and consider heme consult.     ??? 2019 SIR guidelines now consider LP to be a low-risk procedure, was a moderate risk procedure in the 2012 guidelines. [facility] 2015 considers LP high risk, as does MD Dareen Piano 2017 guidelines; both available online  ??? While thoracentesis is considered a low-risk procedure, discuss with IP if patient is on antiplatelets (other than ASA) or DOAC  ??? Adapted from the 2019 Society of Interventional Radiology Consensus Guidelines: https://www.jvir.org/article/S1051-0443(19)30407-5/pdf  ??? While thoracentesis and paracentesis may not require holding anticoagulation, if safe and convenient to hold, doing so may reduce risk for hemorrhage if a vessel is inadvertently punctured      This is the table from [facility] for management when undergoing an urgent procedure - Lumbar punctures are considered MODERATE RISK

## 2021-10-24 NOTE — Unmapped (Signed)
PHYSICAL THERAPY  Evaluation (10/24/21 0802)          Patient Name:?? [facility]????????   Medical Record Number: [id number]   Date of Birth: [birth year]  Sex: Male??  ??    Treatment Diagnosis: decreased functional mobility, balance, and endurance     Activity Tolerance: Limited by fatigue, Other (tolerance to upright positions limited by low BP, dizziness, feeling foggy)     ASSESSMENT  Problem List: Impaired balance, Decreased endurance, Decreased mobility, Fall risk, Gait deviation      Assessment : Matthew Key is a 61 y.o. 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 Pt presents as pleasant, agreeable, and motivated to participate with PT. Demonstrates strength near baseline status however is limited in endurance and activity tolerance 2/2 low BP, + DOE, and deconditioning from frequent hospital stays. Pt is at risk for falls (1 fall within last week PTA) and will benefit from Acute Care PT to address problem list above and progress ambulation/functional mobility prior to discharge. Pt demonstrates appropriateness for Post-Acute PT of 3x weekly to continue addressing balance and endurance deficits and to reduce risk of falls/readmission. After review of contributing co-morbidities and personal factors, clinical presentation and exam findings, patient demonstrates moderate complexity for evaluation and development of plan of care.     Today's Interventions: eval, therapeutic ambulation, transfer training, upright tolerance with close monitoring of BP during positional changes, sitting/standing balance, pt ed: role of PT, importance of daily exercise, precautions, reducing fall risk, getting up with assistance/supervision throughout length of stay, vitals monitored throughout.  After completing mobility tasks during today's therapy session on 10/24/2021, Patient was educated on the benefits of regular activity between therapy sessions. The patient was also educated on the role of the Rehab Mobility Team to assist with activity during this admission; patient indicated understanding.          PLAN  Planned Frequency of Treatment:?? 1-2x per day for: 4-5x week Planned Treatment Duration: 11/07/21     Planned Interventions: Balance activities, Education - Patient, Endurance activities, Functional mobility, Gait training, Home exercise program, Stair training, Therapeutic exercise, Therapeutic activity, Self-care / Home training, Transfer training     Post-Discharge Physical Therapy Recommendations:?? 3x weekly     PT DME Recommendations: None??????????       Goals:   Patient and Family Goals: Pt would like to improve his balance to feel safer and reduce fall risk.     Long Term Goal #1: Pt will continuously ambulate 600 ft Mod I with LRAD to demonstrate improved endurance and functional mobility.        SHORT GOAL #1: Pt will demonstrate increased upright tolerance/endurance by participating in sitting/standing activity for 10 minutes without report of dizziness/fatigue               Time Frame : 2 weeks  SHORT GOAL #2: Pt will continously ambulate 250 ft mod I with LRAD              Time Frame : 2 weeks  SHORT GOAL #3: Pt will participate in Berg Balance Scale assessment              Time Frame : 2 weeks  Prognosis:?? Good  Positive Indicators: PLOF, participation, motivation, current functional mobility  Barriers to Discharge: Endurance deficits, Gait instability, Impaired Balance     SUBJECTIVE  Patient reports: Agreeable to PT. I'll do whatever you want.  Current Functional Status: Pt received and returned supine in bed with all needs met and RN alerted.  Services patient receives: PT (HH PT to work on balance and endurance)  Prior Functional Status: Pt reports he was ambulating in home and limited community distances with [facility]. Pt reports 1 fall since last hospital stay at grocery store trying to retrieve his hat after it flew off in the grocery store, hitting the back of his head. Pt drives and helps takes care of his wife who has breast cancer when needed. 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 Pt is awaiting a liver transplant.  Equipment available at home: [employer], [place], [employer] chair with back, Long-handled shoehorn, Sock aid, Sports administrator, Long-handled sponge      Past Medical History:   Diagnosis Date   ??? AKI (acute kidney injury) (CMS-HCC) 12/14/2020   ??? Anxiety 10/22/2013   ??? Arthritis    ??? Cervical radiculopathy 12/03/2016   ??? Chronic pain disorder     Lower back   ??? Cirrhosis (CMS-HCC)    ??? Dental abscess 10/2020   ??? Duodenal ulcer 12/01/2017   ??? GERD (gastroesophageal reflux disease)    ??? History of transfusion    ??? Hyperlipidemia 10/22/2013   ??? Hypertension     under control with meds and weight loss   ??? Liver disease    ??? Sleep apnea, obstructive     Have machine   ??? Stroke (CMS-HCC)     mild stroke   ??? Type 2 diabetes mellitus with diabetic neuropathy, with long-term current use of insulin (CMS-HCC) 06/09/2014            Social History     Tobacco Use   ??? Smoking status: Never   ??? Smokeless tobacco: Never   Substance Use Topics   ??? Alcohol use: Not Currently       Past Surgical History:   Procedure Laterality Date   ??? KNEE SURGERY     ??? PR CATH PLACE/CORON ANGIO, IMG SUPER/INTERP,R&L HRT CATH, L HRT VENTRIC N/A 12/19/2020    Procedure: CATH LEFT/RIGHT HEART CATHETERIZATION;  Surgeon: Rosana Hoes, MD;  Location: [facility] CATH;  Service: Cardiology   ??? PR UPPER GI ENDOSCOPY,BIOPSY N/A 10/12/2020    Procedure: UGI ENDOSCOPY; WITH BIOPSY, SINGLE OR MULTIPLE;  Surgeon: Marene Lenz, MD;  [facility];  Service: Gastroenterology   ??? ROOT CANAL      Front teeth             Family History Problem Relation Age of Onset   ??? Edema Mother    ??? Alzheimer's disease Father    ??? Aortic dissection Brother    ??? Early death Brother    ??? Aneurysm Brother         Allergies: Venom-honey bee       Objective Findings  Precautions / Restrictions  Precautions: Falls precautions  Weight Bearing Status: Non-applicable  Required Braces or Orthoses: Non-applicable     Communication Preference: Verbal          Pain Comments: Denies pain at rest. Reports pain in chest 7/10 when coughing.  Medical Tests /  Procedures: orders, labs, imaging, consults, progress notes reviewed in Epic  Equipment / Environment: Vascular access (PIV, TLC, Port-a-cath, PICC)     At Rest: VSS  With Activity: SpO2: 100%; HR: 76 after pre-gait activities sitting EOB  Orthostatics: Pt reports feeling woozy with all upright positions which quickly resolved upon returning to supine at the end of the session. BP standing: 87/41(55), 77/43(55) RN made aware        Living Situation  Living Environment: Apartment  Lives With: Spouse  Home Living: One level home, Level entry, Standard height toilet, Tub/shower unit, Shower chair with back      Cognition: WFL  Orientation: Oriented x4  Visual/Perception: Wears Glasses/Contacts (has reading glasses, states he wants to get prescription glasses once he leaves the hospital)     Skin Inspection: Intact where visualized     Upper Extremities  UE ROM: Right WFL, Left WFL  UE Strength: Right WFL, Left WFL    Lower Extremities  LE ROM: Right WFL, Left WFL  LE Strength: Right WFL, Left WFL  LE comment: MMT: 5/5 in hip flexion, knee flexion/extension B LEs     Coordination: WFL, Finger to nose intact, Heel shin intact  Coordination comment: displays ability to perform synchronous ankle DF/PF and forearm supination/pronation  Sensation: WFL  Sensation comment: denies N/T at this time however reports tingling in B arms when he does not take his medications  Balance comment: static sitting EOB: indep; static standing: SBA with and without RW due to reports of dizziness/feeling unstable, pt with 1 LOB to L with turning 90 degrees coming out of the bathroom, able to self correct with increased time, no external support required  Posture: WFL  Motor/Sensory/Neuro Comments: (-) asterixis      Bed Mobility: Rolling Right, Rolling Left, Supine to Sit  Rolling right assistance level: Independent  Rolling left assistance level: Independent  Supine to Sit assistance level: Independent     Sit to Stand assistance level: Standby assist, set-up cues, supervision of patient - no hands on  Transfer comments: Sit<>stand x 3 reps at EOB: SBA with RW, supervision to monitor vitals and symptoms.      Gait Level of Assistance: Standby assist, set-up cues, supervision of patient - no hands on  Gait Assistive Device: Front wheel walker  Gait Distance Ambulated (ft): 50 ft  Gait: Pt ambulated 50 feet in room (bed to door and back x2) with SBA and RW. Demonstrates steady gait and pace with decreased speed. Demonstrates appropriate use of AD.     Stairs: not assessed this date; pt lives in a 1 story apt with level entry       Endurance: poor, pt demonstrated increased work of breathing and reported fatigue after pre-gait activities and ambulation of 74ft. SpO2 stable.     Physical Therapy Session Duration  PT Individual [mins]: 20  PT Co-Treatment [mins]: 27  Reason for Co-treatment: Poor activity tolerance     Medical Staff Made Aware: RN     I attest that I have reviewed the above information.  Signed: Madlyn Frankel Rosalba Totty, PT  Filed 1/61/0960     The care for this patient was completed by Madlyn Frankel [place] Severs, PT:  A student was present and participated in the care. Licensed/Credentialed therapist was physically present and immediately available to direct and supervise tasks that were related to patient management. The direction and supervision was continuous throughout the time these tasks were performed.    Madlyn Frankel Shannette Tabares, PT

## 2021-10-24 NOTE — Unmapped (Signed)
Care Management  Initial Transition Planning Assessment    Per H&P: Matthew Key is a 61 y.o. 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              General  Care Manager assessed the patient by : In person interview with patient, Medical record review, Discussion with Clinical Care team  Orientation Level: Oriented X4  Functional level prior to admission: Partially Assisted  Who provides care at home?: Family member  Level of assistance required: Transferring  Reason for referral: Discharge Planning, Home Health    Contact/Decision Maker  Extended Emergency Contact Information  Primary Emergency Contact: Wolin,Sherry  Mobile Phone: [phone]  Relation: Spouse  Secondary Emergency Contact: Tow,Khambrel B  Mobile Phone: [phone]  Relation: Son   Type of Residence: Mailing Address:  [address]  [place] [address]  Contacts: Accompanied by: Alone  Patient Phone Number: [phone] (home)         Medical Provider(s): Loman Brooklyn, MD  Reason for Admission: Admitting Diagnosis:  Dehydration [E86.0]  AKI (acute kidney injury) (CMS-HCC) [N17.9]  Past Medical History:   has a past medical history of AKI (acute kidney injury) (CMS-HCC) (12/14/2020), Anxiety (10/22/2013), Arthritis, Cervical radiculopathy (12/03/2016), Chronic pain disorder, Cirrhosis (CMS-HCC), Dental abscess (10/2020), Duodenal ulcer (12/01/2017), GERD (gastroesophageal reflux disease), History of transfusion, Hyperlipidemia (10/22/2013), Hypertension, Liver disease, Sleep apnea, obstructive, Stroke (CMS-HCC), and Type 2 diabetes mellitus with diabetic neuropathy, with long-term current use of insulin (CMS-HCC) (06/09/2014).  Past Surgical History:   has a past surgical history that includes pr upper gi endoscopy,biopsy (N/A, 10/12/2020); pr cath place/coron angio, img super/interp,r&l hrt cath, l hrt ventric (N/A, 12/19/2020); Knee surgery; and Root canal.   Previous admit date: 10/12/2021    Primary Insurance- Payor: HUMANA MEDICARE ADV / Plan: HUMANA GOLD PLUS HMO / Product Type: *No Product type* /   Secondary Insurance - None  Prescription Coverage - same as above  Preferred Pharmacy - [facility] - [place], [place] - [address]  [facility] - [place], [place] - [address]  [facility]  [facility] (NOW [facility]) - [place], [place] - [address]  [facility]    Transportation home: Heritage manager Next of Kin / Guardian / POA / Advance Directives     HCDM (patient stated preference): Burling,Sherry - Spouse - [phone]    Advance Directive (Medical Treatment)  Does patient have an advance directive covering medical treatment?: Patient does not have advance directive covering medical treatment.  Reason patient does not have an advance directive covering medical treatment:: Patient needs follow-up to complete one.    Health Care Decision Maker [HCDM] (Medical & Mental Health Treatment)  Healthcare Decision Maker: HCDM documented in the HCDM/Contact Info section.  Information offered on HCDM, Medical & Mental Health advance directives:: Referral made for patient.  Referral Made: Yes, referral made to social services    Readmission Information    Have you been hospitalized in the last 30 days?: Yes  Name of Hospital: [facility]  Were you being cared for at a skilled nursing facility:: No     What day were you discharged from  that hospital or facility?: 10/14/21  Number of Days between previous discharge and readmission date: 8-14 days    Type of Readmission: Unplanned readmission due to new medical issue/unrelated to previous admission    Readmission Source: Home Health  Name of Admission Source: [facility]    Did the following happen with your discharge?    Did you receive a follow-up/transition call?: Yes     Did you obtain your discharge medications?  : Yes    Which services or equipment arranged after your discharge arrived?: Home Health    Did you understand your discharge instructions?: Yes       Contributing Factors for Readmission: New diagnosis -not related to previous admission    Do you think your readmission could have been prevented?: No    Patient Information  Lives with: Spouse/significant other    Type of Residence: Private residence     Location/Detail: [place]    Support Systems/Concerns: Spouse, Family Members    Responsibilities/Dependents at home?: No    Home Care services in place prior to admission?: No  Type of Home Care services in place prior to admission: Home OT, Home PT  Current Home Care provider (Name/Phone #): [facility]    Equipment Currently Used at Home: shower chair, cane, straight, respiratory supplies (CPAP)  Current HME Agency (Name/Phone #): CPAP supplies from [facility]    Currently receiving outpatient dialysis?: No    Financial Information    Need for financial assistance?: No    Social Determinants of Health  Social Determinants of Health     Food Insecurity: No Food Insecurity   ??? Worried About Programme researcher, broadcasting/film/video in the Last Year: Never true   ??? Ran Out of Food in the Last Year: Never true   Tobacco Use: Low Risk    ??? Smoking Tobacco Use: Never   ??? Smokeless Tobacco Use: Never   ??? Passive Exposure: Not on file   Transportation Needs: No Transportation Needs   ??? Lack of Transportation (Medical): No   ??? Lack of Transportation (Non-Medical): No   Alcohol Use: Not on file   Housing/Utilities: Low Risk    ??? Within the past 12 months, have you ever stayed: outside, in a car, in a tent, in an overnight shelter, or temporarily in someone else's home (i.e. couch-surfing)?: No   ??? Are you worried about losing your housing?: No ??? Within the past 12 months, have you been unable to get utilities (heat, electricity) when it was really needed?: No   Substance Use: Not on file   Financial Resource Strain: Low Risk    ??? Difficulty of Paying Living Expenses: Not hard at all   Physical Activity: Not on file   Health Literacy: Not on file   Stress: Not on file   Intimate Partner Violence: Not on file   Depression: Not at risk   ??? PHQ-2 Score: 0   Social Connections: Not on file     Complex Discharge Information    Is patient identified as a difficult/complex discharge?: No    Discharge Needs Assessment  Concerns to be Addressed: care coordination/care conferences, adjustment to diagnosis/illness    Clinical Risk Factors: Multiple Diagnoses (Chronic), Readmission < 30 Days    Barriers to taking medications: No    Prior overnight hospital stay or ED visit in last 90 days: Yes    Anticipated Changes Related to Illness: none    Equipment Needed After Discharge: other (see comments) (Per recs)  Discharge Facility/Level of Care Needs: other (see comments) (HH)    Readmission  Risk of Unplanned Readmission Score:  %  Predictive Model Details   No score data available for [facility] Risk of Unplanned Readmission     Readmitted Within the Last 30 Days? (No if blank)   Patient at risk for readmission?: Yes    Discharge Plan  Screen findings are: Discharge planning needs identified or anticipated (Comment).    Expected Discharge Date: 10/29/2021    Expected Transfer from Critical Care:  (N/A)    Quality data for continuing care services shared with patient and/or representative?: Yes  Patient and/or family were provided with choice of facilities / services that are available and appropriate to meet post hospital care needs?: Yes   List choices in order highest to lowest preferred, if applicable. : [facility]    Initial Assessment complete?: Yes

## 2021-10-24 NOTE — Unmapped (Signed)
Hepatology Consult Service   Initial Consultation         Assessment and Recommendations:   Matthew Key is a 61 y.o. 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 Found to have an AKI on labs. Pt is seen in consultation at the request of Donnal Moat, * (Med Candie Echevaria (MDW)) for nausea and diarrhea.    Nausea, diarrhea: symptoms largely resolved. Has not had a bowel movement since admission. Although there are reports of vomiting in notes, he reported no vomiting to me. He has h/o SBP, and while he is not having significant abdominal pain, would recommend getting diagnostic tap to r/o SBP.    Matthew Key associates symptoms with rifaximin, although this is not a new medication for him. He states he noticed symptoms after taking rifaximin, and symptoms improved after he held it. Symptoms returned when he restarted it. He has h/o difficult to control HE, so would like to continue this medication if possible. He did receive a dose of rifaximin this AM, and seems to have tolerated it. Given his h/o HE, would be reluctant to stop his rifaximin. Would continue it BID for now, and monitor response.    --diagnostic para to r/o SBP  --continue lactulose titrated for 3-5 bowel movements/day  --continue rifaximin 550 mg BID, monitor symptoms    Anemia: Matthew Key's hgb at baseline ~9-11. Last EGD 10/2020 with nonbleeding grade I varices. He is on carvedilol as outpatient. hgb down from 10 to 8.4 overnight. Suspect this may be dilutional, as patient received 50 grams albumin. He reports no bloody or tarry stools, and has not had a bowel movement since admission, which argues against GI bleed. No vomiting reported currently. CTM, but currently, no indication for EGD.    --CTM  --agree with holding carvedilol at this time, given AKI and likely dehydration      AKI: suspect 2/2 to dehydration from diarrhea and nausea. Improving with holding diuretics and albumin. CTM. UA unremarkable. No c/f UTI. Diagnostic para to r/o SBP. Creatinine improving today as compared to yesterday. Strict I/O.    --continue to hold diuretics  --diagnostic tap to r/o SBP  --received 50 grams albumin today, will assess need for further albumin based on labs tomorrow     I have communicated recommendations with the patient's primary team    Thank you for involving [place] in the care of your patient. We will continue to follow along with you.    For questions, please contact the on-call fellow for the Hepatology Consult Service at [phone].     Reason for Consultation:   Pt was seen in consultation at the request of Donnal Moat, * (Med Candie Echevaria (MDW)) for nausea and diarrhea.    Subjective:   HPI:  Matthew Key is a 61 year old male with cryptogenic cirrhosis. 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 C/b ascites, SBP 08/2021, nonbleeding Gr1 varices (not on betablocker d/t hyponatremia/fluid issues), hepatic encephalopathy (on lactulose and xifaxan). Has had several hospitalizations for nausea/vomiting and/or AMS. Presented to [facility] with nausea, diarrhea. Found to have AKI on labs, and was transferred to [facility] for further management.    Matthew Key is alert and oriented today. His diarrhea has completely resolved, and he has not had a bowel movement since admission to [facility]. He states that after he took rifaximin, he had bloating and then the diarrhea started.  It was bad enough that he sometimes could not make it to the bathroom in time.  He stopped the rifaximin, and symptoms resolved. Symptoms started again when he re-started the rifaximin. He does have h/o somewhat difficult to control HE. No abdominal pain. No fevers/chills.    Objective:   Physical Exam:  Temp:  [36.2 ??C (97.2 ??F)-37 ??C (98.6 ??F)] 37 ??C (98.6 ??F)  Heart Rate:  [60-79] 76  Resp:  [18] 18  BP: (80-119)/(27-57) 81/27  SpO2:  [97 %-100 %] 100 %    Gen: WDWN male in NAD, answers questions appropriately  Abdomen: distended, no rebound/guarding, no hepatosplenomegaly  Extremities: No edema in the BLEs    Pertinent Labs/Studies Reviewed:  CMP, CBC, INR

## 2021-10-24 NOTE — Unmapped (Signed)
Patient is alert, oriented x4. Admitted from [facility] ED. On enteric isolation pending c-diff r/o. No BM since being on unit. Albumin infusing per order. Tylenol administered for pain.  Problem: Adult Inpatient Plan of Care  Goal: Plan of Care Review  Outcome: Progressing  Goal: Patient-Specific Goal (Individualized)  Outcome: Progressing  Goal: Absence of Hospital-Acquired Illness or Injury  Outcome: Progressing  Intervention: Identify and Manage Fall Risk  Recent Flowsheet Documentation  Taken 10/24/2021 0200 by Ruby Cola, RN BSN  Safety Interventions:   environmental modification   fall reduction program maintained   low bed   lighting adjusted for tasks/safety  Taken 10/24/2021 0000 by Ruby Cola, RN BSN  Safety Interventions:   environmental modification   fall reduction program maintained   low bed   lighting adjusted for tasks/safety  Intervention: Prevent Infection  Recent Flowsheet Documentation  Taken 10/24/2021 0000 by Ruby Cola, RN BSN  Infection Prevention:   environmental surveillance performed   equipment surfaces disinfected   hand hygiene promoted   personal protective equipment utilized   rest/sleep promoted   single patient room provided  Goal: Optimal Comfort and Wellbeing  Outcome: Progressing  Goal: Readiness for Transition of Care  Outcome: Progressing  Goal: Rounds/Family Conference  Outcome: Progressing     Problem: Adjustment to Illness (Liver Failure)  Goal: Optimal Coping with Liver Failure  Outcome: Progressing     Problem: Fluid and Electrolyte Imbalance (Liver Failure)  Goal: Fluid and Electrolyte Balance  Outcome: Progressing     Problem: Gastrointestinal Complications (Liver Failure)  Goal: Optimal Gastrointestinal Function  Outcome: Progressing     Problem: Impaired Coagulation (Liver Failure)  Goal: Optimal Coagulation Function  Outcome: Progressing  Intervention: Monitor and Manage Coagulation  Recent Flowsheet Documentation  Taken 10/24/2021 0000 by Ruby Cola, RN BSN  Bleeding Precautions: blood pressure closely monitored     Problem: Infection (Liver Failure)  Goal: Absence of Infection Signs and Symptoms  Outcome: Progressing  Intervention: Prevent or Manage Infection  Recent Flowsheet Documentation  Taken 10/24/2021 0200 by Ruby Cola, RN BSN  Isolation Precautions: enteric precautions maintained  Taken 10/24/2021 0000 by Ruby Cola, RN BSN  Infection Management: aseptic technique maintained  Isolation Precautions: enteric precautions initiated     Problem: Oral Intake Inadequate (Liver Failure)  Goal: Improved Oral Intake  Outcome: Progressing     Problem: Neurologic Function Impaired (Liver Failure)  Goal: Optimal Neurologic Function  Outcome: Progressing     Problem: Pain (Liver Failure)  Goal: Optimal Pain Control  Outcome: Progressing     Problem: Renal Dysfunction (Liver Failure)  Goal: Optimize Renal Function  Outcome: Progressing     Problem: Respiratory Compromise (Liver Failure)  Goal: Effective Oxygenation and Ventilation  Outcome: Progressing     Problem: Diabetes Comorbidity  Goal: Blood Glucose Level Within Targeted Range  Outcome: Progressing

## 2021-10-24 NOTE — Unmapped (Signed)
Pt A&Ox4, hypotensive today, s/p 50g albumin per order, some improvement in BP after infusion, blood glucose monitored, enteric precautions maintained, denies pain, continuing with plan of care.    Problem: Adult Inpatient Plan of Care  Goal: Plan of Care Review  Outcome: Progressing  Flowsheets (Taken 10/24/2021 1546)  Progress: no change  Plan of Care Reviewed With: patient  Goal: Patient-Specific Goal (Individualized)  Outcome: Progressing  Flowsheets (Taken 10/24/2021 1546)  Patient-Specific Goals (Include Timeframe): Pt will be euglycemic this shift  Individualized Care Needs: Monitor labs/VS/BG, fluid volume mgmt, enteric isolation, fall/safety precautions  Anxieties, Fears or Concerns: None expressed  Goal: Absence of Hospital-Acquired Illness or Injury  Outcome: Progressing  Goal: Optimal Comfort and Wellbeing  Outcome: Progressing  Goal: Readiness for Transition of Care  Outcome: Progressing  Goal: Rounds/Family Conference  Outcome: Progressing     Problem: Adjustment to Illness (Liver Failure)  Goal: Optimal Coping with Liver Failure  Outcome: Progressing     Problem: Fluid and Electrolyte Imbalance (Liver Failure)  Goal: Fluid and Electrolyte Balance  Outcome: Progressing     Problem: Gastrointestinal Complications (Liver Failure)  Goal: Optimal Gastrointestinal Function  Outcome: Progressing     Problem: Impaired Coagulation (Liver Failure)  Goal: Optimal Coagulation Function  Outcome: Progressing     Problem: Infection (Liver Failure)  Goal: Absence of Infection Signs and Symptoms  Outcome: Progressing  Intervention: Prevent or Manage Infection  Recent Flowsheet Documentation  Taken 10/24/2021 1400 by Camila Li, RN  Isolation Precautions: enteric precautions maintained  Taken 10/24/2021 1200 by Camila Li, RN  Isolation Precautions: enteric precautions maintained  Taken 10/24/2021 1000 by Camila Li, RN  Isolation Precautions: enteric precautions maintained  Taken 10/24/2021 0800 by Camila Li, RN  Isolation Precautions: enteric precautions maintained     Problem: Neurologic Function Impaired (Liver Failure)  Goal: Optimal Neurologic Function  Outcome: Progressing     Problem: Oral Intake Inadequate (Liver Failure)  Goal: Improved Oral Intake  Outcome: Progressing     Problem: Pain (Liver Failure)  Goal: Optimal Pain Control  Outcome: Progressing     Problem: Renal Dysfunction (Liver Failure)  Goal: Optimize Renal Function  Outcome: Progressing     Problem: Respiratory Compromise (Liver Failure)  Goal: Effective Oxygenation and Ventilation  Outcome: Progressing     Problem: Diabetes Comorbidity  Goal: Blood Glucose Level Within Targeted Range  Outcome: Progressing     Problem: Infection  Goal: Absence of Infection Signs and Symptoms  Outcome: Progressing  Intervention: Prevent or Manage Infection  Recent Flowsheet Documentation  Taken 10/24/2021 1400 by Camila Li, RN  Isolation Precautions: enteric precautions maintained  Taken 10/24/2021 1200 by Camila Li, RN  Isolation Precautions: enteric precautions maintained  Taken 10/24/2021 1000 by Camila Li, RN  Isolation Precautions: enteric precautions maintained  Taken 10/24/2021 0800 by Camila Li, RN  Isolation Precautions: enteric precautions maintained     Problem: Fall Injury Risk  Goal: Absence of Fall and Fall-Related Injury  Outcome: Progressing

## 2021-10-24 NOTE — Unmapped (Signed)
OCCUPATIONAL THERAPY  Evaluation (10/24/21 0801)    Patient Name:  Matthew Key       Medical Key Number: [id number]   Date of Birth: 1960-09-16  Sex: Male            OT Treatment Diagnosis:  Decreased activity tolerance    Assessment        Samrat Trace Wirick is a 61 y.o. 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  Pt completing standing grooming tasks and toileting with intermittent steadying assist without use of DME this date. Pt exhibiting limited ability to reach BLEs (baseline), and typically uses LB AE for dressing and bathing. Pt completing OOB mobility in room with Rw, limited standing tolerance 2/2 lightheadedness and drop in BP with prolonged standing, RN made aware. After review of the patient's occupational profile and history, assessment of occupational performance, clinical decision making, and development of POC, the patient presents as a low complexity case.       Today's Interventions: Pt educated on role of OT, POC, bed mobility, LBD activities, sitting tolerance/balance, functional transfers, standing tolerance/balance, standing grooming tasks, educ on safety with BP/orthostatics, functional mobility in room with RW, educ on benefits of OOB.     Activity Tolerance During Today's Session  Tolerated treatment well, Limited secondary to medical complications (Limited 2/2 orthostatics)    Plan  Planned Frequency of Treatment:  1-2x per day for: 3-4x week       Planned Interventions:  Education - Patient, Adaptive equipment, ADL retraining, Functional mobility, Education - Family / caregiver, Home exercise program, Balance activities, Bed mobility, Endurance activities, Compensatory tech. training, Environmental support, Functional cognition, Conservation, Safety education, Therapeutic exercise, Transfer training, UE Strength / coordination exercise, Positioning    Post-Discharge Occupational Therapy Recommendations:   3x weekly   OT DME Recommendations: None -        GOALS:   Patient and Family Goals: To get home    IP Long Term Goal #1: Pt will score 24/24 on AMPAC within 4 weeks       Short Term:  SHORT GOAL #1: Pt will complete funcitonal mobility to gather ADL items with MOD I.   Time Frame : 2 weeks  SHORT GOAL #2: Pt will complete full LBD with MOD I with AE PRN.   Time Frame : 2 weeks  SHORT GOAL #3: Pt will complete > 5 minute standing bimanual ADL with MOD I.   Time Frame : 2 weeks          Prognosis:  Good  Positive Indicators:  PLOF  Barriers to Discharge: (P) Endurance deficits, Gait instability, Impaired Balance    Subjective  Current Status Pt received/left semi-reclined in bed, all needs within reach and RN Evan updated  Prior Functional Status Pt reports being MOD I with ADL/IADL, using sock aid, reacher, and long handled sponge PRN. Pt endorses uses SPC for mobility and having a few falls in the past year. Pt endorses family assists with transportation and can assist with IADLs as needed. 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 Pt intermittently has jerking in BUEs with increased ammonia/altered labs. Of note, pt's wife is receiving radiation for breast cancer.    Medical Tests / Procedures: Reviewed in EPIC  Services patient receives: (P) PT (HH PT to work on balance and endurance)    Patient / Caregiver reports: I'm a Insurance underwriter for a race team    Past Medical History:   Diagnosis Date   ??? AKI (acute kidney injury) (CMS-HCC) 12/14/2020   ??? Anxiety 10/22/2013   ??? Arthritis    ??? Cervical radiculopathy 12/03/2016   ??? Chronic pain disorder     Lower back   ??? Cirrhosis (CMS-HCC)    ??? Dental abscess 10/2020   ??? Duodenal ulcer 12/01/2017   ??? GERD (gastroesophageal reflux disease)    ??? History of transfusion    ??? Hyperlipidemia 10/22/2013   ??? Hypertension     under control with meds and weight loss   ??? Liver disease    ??? Sleep apnea, obstructive     Have machine   ??? Stroke (CMS-HCC)     mild stroke   ??? Type 2 diabetes mellitus with diabetic neuropathy, with long-term current use of insulin (CMS-HCC) 06/09/2014    Social History     Tobacco Use   ??? Smoking status: Never   ??? Smokeless tobacco: Never   Substance Use Topics   ??? Alcohol use: Not Currently      Past Surgical History:   Procedure Laterality Date   ??? KNEE SURGERY     ??? PR CATH PLACE/CORON ANGIO, IMG SUPER/INTERP,R&L HRT CATH, L HRT VENTRIC N/A 12/19/2020    Procedure: CATH LEFT/RIGHT HEART CATHETERIZATION;  Surgeon: Rosana Hoes, MD;  Location: [facility] CATH;  Service: Cardiology   ??? PR UPPER GI ENDOSCOPY,BIOPSY N/A 10/12/2020    Procedure: UGI ENDOSCOPY; WITH BIOPSY, SINGLE OR MULTIPLE;  Surgeon: Marene Lenz, MD;  [facility];  Service: Gastroenterology   ??? ROOT CANAL      Front teeth    Family History   Problem Relation Age of Onset   ??? Edema Mother    ??? Alzheimer's disease Father    ??? Aortic dissection Brother    ??? Early death Brother    ??? Aneurysm Brother         [place]-honey bee     Objective Findings  Precautions / Restrictions  (P) Falls precautions    Weight Bearing  (P) Non-applicable    Required Braces or Orthoses  (P) Non-applicable    Communication Preference  Verbal    Pain  7/10 in chest when coughing, 0/10 at rest    Equipment / Environment  (P) Vascular access (PIV, TLC, Port-a-cath, PICC)    Living Situation  Living Environment: (P) Apartment  Lives With: (P) Spouse  Home Living: (P) One level home, Level entry, Standard height toilet, Tub/shower unit, Shower chair with back  Equipment available at home: (P) Rolling Walker, Medical laboratory scientific officer, Air traffic controller chair with back, Agricultural engineer, TEFL teacher, Sports administrator, Long-handled sponge     Cognition   Orientation Level:  Oriented x 4   Arousal/Alertness:  Appropriate responses to stimuli   Attention Span:  Appears intact   Memory:  Appears intact   Following Commands:  Follows all commands and directions without difficulty   Safety Judgment:  Good awareness of safety precautions   Awareness of Errors:  Good awareness of errors made   Problem Solving:  Able to problem solve independently  Vision / Hearing   Vision: Glasses present, Wears glasses for reading only     Hearing: No deficit identified         Hand Function:  Right Hand Function: Right hand grip strength, ROM and coordination WNL  Left Hand Function: Left hand grip strength, ROM and coordination WNL    Skin Inspection:  Skin Inspection comment: Abdominal distension    ROM / Strength:  UE ROM/Strength: Right WFL, Left WFL  LE ROM/Strength: Left WFL, Right WFL    Coordination:  Coordination: WFL  Coordination comment: Endorses jerkiness in BUEs intermittently, not present today         Balance:  Seated=supervision static, SBA dynamic. Standing=CGA with RW    Functional Mobility  Transfer Assistance Needed: Yes (Sits-stand=SBA no DME)  Transfers - Needs Assistance: Standby assist  Bed Mobility Assistance Needed: Yes  Bed Mobility - Needs Assistance: Supervision  Ambulation: Functional mobility in room=CGA with RW      ADLs  ADLs: Needs assistance with ADLs  ADLs - Needs Assistance: LB dressing, Bathing, Grooming, Toileting, UB dressing, Feeding  Feeding - Needs Assistance: Set Up Assist  Grooming - Needs Assistance: Set Up Assist, Performed seated  Bathing - Needs Assistance: Mod assist, Performed seated  Toileting - Needs Assistance: Performed standing (SBA)  UB Dressing - Needs Assistance: Set Up Assist  LB Dressing - Needs Assistance: Mod assist, Performed seated (Pt typically [place] LB AE, not present this session. Pt unable to reach B feet.)      Vitals / Orthostatics  At Rest: NAD  With Activity: 97/55 (60), HR=78 seated EOB, 100% spO2  Vitals/Orthostatics: 87/44 (55) standing, 78/41 (55) prolonged standing---HR=70s. Mildly symptomatic, tolerating room level mobility. Recovered back in supine.      Medical Staff Made Aware: RN Clayburn Pert      Occupational Therapy Session Duration  OT Individual [mins]: 11  OT Co-Treatment [mins]: 30 (overlap with Val Eagle PT, billed 10 min tx)  Reason for Co-treatment: To safely progress mobility         I attest that I have reviewed the above information.  Signed: Caleen Essex, OT  Filed 10/24/2021

## 2021-10-25 LAB — BASIC METABOLIC PANEL
ANION GAP: 6 mmol/L (ref 5–14)
BLOOD UREA NITROGEN: 21 mg/dL (ref 9–23)
BUN / CREAT RATIO: 12
CALCIUM: 8.6 mg/dL — ABNORMAL LOW (ref 8.7–10.4)
CHLORIDE: 107 mmol/L (ref 98–107)
CO2: 26 mmol/L (ref 20.0–31.0)
CREATININE: 1.69 mg/dL — ABNORMAL HIGH
EGFR CKD-EPI (2021) MALE: 46 mL/min/{1.73_m2} — ABNORMAL LOW (ref >=60)
GLUCOSE RANDOM: 96 mg/dL (ref 70–179)
POTASSIUM: 4.9 mmol/L — ABNORMAL HIGH (ref 3.4–4.8)
SODIUM: 139 mmol/L (ref 135–145)

## 2021-10-25 LAB — BODY FLUID, PATH REVIEW

## 2021-10-25 LAB — PHOSPHORUS: PHOSPHORUS: 3.8 mg/dL (ref 2.4–5.1)

## 2021-10-25 LAB — CBC
HEMATOCRIT: 25.7 % — ABNORMAL LOW (ref 39.0–48.0)
HEMOGLOBIN: 8.9 g/dL — ABNORMAL LOW (ref 12.9–16.5)
MEAN CORPUSCULAR HEMOGLOBIN CONC: 34.6 g/dL (ref 32.0–36.0)
MEAN CORPUSCULAR HEMOGLOBIN: 34.7 pg — ABNORMAL HIGH (ref 25.9–32.4)
MEAN CORPUSCULAR VOLUME: 100.4 fL — ABNORMAL HIGH (ref 77.6–95.7)
MEAN PLATELET VOLUME: 10.9 fL — ABNORMAL HIGH (ref 6.8–10.7)
PLATELET COUNT: 53 10*9/L — ABNORMAL LOW (ref 150–450)
RED BLOOD CELL COUNT: 2.56 10*12/L — ABNORMAL LOW (ref 4.26–5.60)
RED CELL DISTRIBUTION WIDTH: 16.9 % — ABNORMAL HIGH (ref 12.2–15.2)
WBC ADJUSTED: 3.7 10*9/L (ref 3.6–11.2)

## 2021-10-25 LAB — MAGNESIUM: MAGNESIUM: 1.7 mg/dL (ref 1.6–2.6)

## 2021-10-25 MED ADMIN — insulin lispro (HumaLOG) injection 0-6 Units: 0-6 [IU] | SUBCUTANEOUS | @ 17:00:00 | Stop: 2021-10-25

## 2021-10-25 MED ADMIN — gabapentin (NEURONTIN) capsule 400 mg: 400 mg | ORAL

## 2021-10-25 MED ADMIN — rifAXIMin (XIFAXAN) tablet 550 mg: 550 mg | ORAL | Stop: 2021-11-23

## 2021-10-25 MED ADMIN — atorvastatin (LIPITOR) tablet 10 mg: 10 mg | ORAL

## 2021-10-25 MED ADMIN — lactulose oral solution: 20 g | ORAL | @ 13:00:00 | Stop: 2021-10-25

## 2021-10-25 MED ADMIN — pantoprazole (PROTONIX) EC tablet 40 mg: 40 mg | ORAL | @ 13:00:00 | Stop: 2021-10-25

## 2021-10-25 MED ADMIN — albumin human 25 % bottle 50 g: 50 g | INTRAVENOUS | @ 16:00:00 | Stop: 2021-10-25

## 2021-10-25 MED ADMIN — rifAXIMin (XIFAXAN) tablet 550 mg: 550 mg | ORAL | @ 13:00:00 | Stop: 2021-10-25

## 2021-10-25 MED ADMIN — ciprofloxacin HCl (CIPRO) tablet 500 mg: 500 mg | ORAL | @ 13:00:00 | Stop: 2021-10-25

## 2021-10-25 MED ADMIN — gabapentin (NEURONTIN) capsule 400 mg: 400 mg | ORAL | @ 13:00:00 | Stop: 2021-10-25

## 2021-10-25 MED ADMIN — lactulose oral solution: 20 g | ORAL

## 2021-10-25 NOTE — Unmapped (Signed)
Patient is alert and oriented x4. He is not in any acute distress. Patient is able to get up to the bathroom. Still pending stool collection to r/o c-diff.  Problem: Adult Inpatient Plan of Care  Goal: Plan of Care Review  Outcome: Progressing  Goal: Patient-Specific Goal (Individualized)  Outcome: Progressing  Goal: Absence of Hospital-Acquired Illness or Injury  Outcome: Progressing  Intervention: Identify and Manage Fall Risk  Recent Flowsheet Documentation  Taken 10/25/2021 0000 by Ruby Cola, RN BSN  Safety Interventions:   fall reduction program maintained   environmental modification   low bed   lighting adjusted for tasks/safety  Taken 10/24/2021 2200 by Ruby Cola, RN BSN  Safety Interventions:   fall reduction program maintained   environmental modification   low bed   lighting adjusted for tasks/safety  Taken 10/24/2021 2000 by Ruby Cola, RN BSN  Safety Interventions:   fall reduction program maintained   environmental modification   low bed   lighting adjusted for tasks/safety  Intervention: Prevent Skin Injury  Recent Flowsheet Documentation  Taken 10/24/2021 2200 by Ruby Cola, RN BSN  Skin Protection:   adhesive use limited   incontinence pads utilized  Intervention: Prevent Infection  Recent Flowsheet Documentation  Taken 10/25/2021 0000 by Ruby Cola, RN BSN  Infection Prevention: environmental surveillance performed  Taken 10/24/2021 2200 by Ruby Cola, RN BSN  Infection Prevention: environmental surveillance performed  Goal: Optimal Comfort and Wellbeing  Outcome: Progressing  Goal: Readiness for Transition of Care  Outcome: Progressing  Goal: Rounds/Family Conference  Outcome: Progressing     Problem: Adjustment to Illness (Liver Failure)  Goal: Optimal Coping with Liver Failure  Outcome: Progressing     Problem: Fluid and Electrolyte Imbalance (Liver Failure)  Goal: Fluid and Electrolyte Balance  Outcome: Progressing     Problem: Gastrointestinal Complications (Liver Failure)  Goal: Optimal Gastrointestinal Function  Outcome: Progressing     Problem: Impaired Coagulation (Liver Failure)  Goal: Optimal Coagulation Function  Outcome: Progressing     Problem: Infection (Liver Failure)  Goal: Absence of Infection Signs and Symptoms  Outcome: Progressing     Problem: Neurologic Function Impaired (Liver Failure)  Goal: Optimal Neurologic Function  Outcome: Progressing     Problem: Pain (Liver Failure)  Goal: Optimal Pain Control  Outcome: Progressing     Problem: Renal Dysfunction (Liver Failure)  Goal: Optimize Renal Function  Outcome: Progressing

## 2021-10-25 NOTE — Unmapped (Signed)
Large Volume Paracentesis Procedure Note (CPT [id number])    Pre-procedural Planning     Patient Name:: Matthew Key  Patient MRN: [id number]    Indications:  Diuretic refractory ascites    Known Bleeding Diathesis: Patient/caregiver denies any known bleeding or platelet disorder.     Antiplatelet Agents: This patient is not on an antiplatelet agent.    Systemic Anticoagulation: This patient is not on full systemic anticoagulation.    Significant Labs:  INR   Date Value Ref Range Status   10/23/2021 1.65  Final   09/26/2021 1.6 (H) 0.9 - 1.2 Final     Comment:     Reference interval is for non-anticoagulated patients.  Suggested INR therapeutic range for Vitamin K  antagonist therapy:     Standard Dose (moderate intensity                    therapeutic range):       2.0 - 3.0     Higher intensity therapeutic range       2.5 - 3.5       PT   Date Value Ref Range Status   10/23/2021 19.0 (H) 9.8 - 12.8 sec Final     APTT   Date Value Ref Range Status   08/25/2021 39.2 (H) 25.1 - 36.5 sec Final     Platelet   Date Value Ref Range Status   10/25/2021 53 (L) 150 - 450 10*9/L Final   07/09/2021 57 (LL) 150 - 450 x10E3/uL Final       Consent: Informed consent was obtained after explanation of the risks (including bleeding, infection, bowel perforation, and leaking) and benefits of the procedure (diagnostic information, symptomatic relief). Refer to the consent documentation.    Procedure Details     Time-out was performed immediately prior to the procedure.    The head of the bed was placed 45 degrees above level and a large area of ascitic fluid was identified using ultrasound in the left lower quadrant.  The linear probe with color doppler was used to ensure that a vessel was not located in the proposed needle path.            Skin was cleaned with Chlorhexidine. Local anesthesia with 1 percent lidocaine was introduced subcutaneously then deep to the skin until the parietal peritoneum was anesthetized. A small incision was made at the skin site.  A Caldwell needle with a catheter was introduced into this site using the subcutaneous tunneling technique until ascitic fluid was encountered and the catheter was introduced over the needle.  Ascitic fluid and the catheter were removed with minimal bleeding. Pressure bandage was applied after holding pressure and achieving hemostasis.    Albumin 5%/25% 50 grams was administered at the time of paracentesis.     Findings     1.5 liters of clear and yellow ascites fluid was obtained.  .  Condition     The patient tolerated the procedure well and remains in the same condition as pre-procedure.    Complications     None; patient tolerated the procedure well.      Requesting Service: Med Candie Echevaria (MDW)    Time Requested: 09:00  Time Completed: 10:30    Resident(s) Performing Procedure: Oneida Arenas  Resident Year: PGY2     ________________________________________________________________  Teaching Physician Attestation  I was present for the entirety of the procedure.     Wells Guiles Ellwyn Ergle  Professor of Internal Medicine and Pediatrics  October 25, 2021 10:34 AM

## 2021-10-25 NOTE — Unmapped (Signed)
Physician Discharge Summary [facility]  [address] [facility]  [address]  [place] [address]  Dept: [phone]  Loc: [phone]     Identifying Information:   Matthew Key  09/05/1960  [id number]    Primary Care Physician: Matthew Brooklyn, MD     Code Status: Full Code    Admit Date: 10/23/2021    Discharge Date: 10/25/2021     Discharge To: Home with Home Health and/or PT/OT    Discharge Service: [facility] - General Medicine Floor Team (MEDW)     Discharge Attending Physician: Donnal Moat, MD    Discharge Diagnoses:  Principal Problem (Resolved):    Nausea vomiting and diarrhea POA: Yes  Active Problems:    Non-alcoholic micronodular cirrhosis of liver (CMS-HCC) POA: Yes    OSA on CPAP POA: Not Applicable    Thrombocytopenia, unspecified (CMS-HCC) POA: Yes    Type 2 diabetes mellitus without complication (CMS-HCC) POA: Yes    Anemia POA: Yes    Abdominal ascites POA: Yes    Essential hypertension POA: Yes    AKI (acute kidney injury) (CMS-HCC) POA: Yes    Esophageal varices determined by endoscopy (CMS-HCC) POA: Yes    Gastroesophageal reflux disease POA: Yes    Hyperlipidemia POA: Yes    Hospital Course:   Matthew Key is a 61 y.o. 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    Nausea, Vomiting, Diarrhea: This improved after admission. No hematemesis, hematochezia, or melena reported or observed. No sick contacts, fevers, chills, abdominal pain. Workup negative for pancreatitis and SBP. Unclear etiology of acute worsening, GIPP and C. Diff were still pending at time of discharge. Likely viral in nature.     AKI - Hyperkalemia: Cr elevated to 1.88 at admission, improved towards baseline by discharge. No treatment required for hyperkalemia.     Acute decompensated Cryptogenic Cirrhosis - Hx of Esophageal Varices: No asterixis on exam. C/o increased abdominal distention despite taking his prescribed home lasix and spironolactone and adequate urine output daily. No LVP in ~89yrs, plan was to do this outpatient. No LVP done inpatient due to AKI. Workup negative for SBP. Diuretics and other BP meds were held during admission and patient had some episodes of hypotension that resolved with albumin boluses.     MELD from admission:   MELD-Na score: 21 at 10/24/2021  7:22 AM  MELD score: 21 at 10/24/2021  7:22 AM  Calculated from:  Serum Creatinine: 1.63 mg/dL at 8/41/3244  0:10 AM  Serum Sodium: 137 mmol/L at 10/24/2021  7:22 AM  Total Bilirubin: 2.9 mg/dL at 2/72/5366  4:40 PM  INR(ratio): 1.65 at 10/23/2021  4:15 PM  Age: 35 years    Pleural Effusion - Atelectasis: SOB on admission, remained stable on RA throughout. Bilateral trace pleural effusions (R>L) and adjacent atelectasis noted on CXR. BNP normal. Incentive spirometry was encouraged throughout stay.      Outpatient Provider Follow Up Issues:   None    Touchbase with Outpatient Provider:  Warm Handoff: Completed on 10/25/21 by Matthew Key  (Student) via In-Basket Message    Procedures:  paracentesis  ______________________________________________________________________  Discharge Medications:      Your Medication List      CONTINUE taking these  medications    acetaminophen 325 MG tablet  Commonly known as: TYLENOL  Take 2 tablets (650 mg total) by mouth every six (6) hours as needed.     atorvastatin 10 MG tablet  Commonly known as: LIPITOR  Take 10 mg by mouth nightly.     carvediloL 3.125 MG tablet  Commonly known as: COREG  Take 1 tablet (3.125 mg total) by mouth Two (2) times a day.     ciprofloxacin HCl 500 MG tablet  Commonly known as: CIPRO  TAKE 1 TABLET BY MOUTH IN THE MORNING     flash glucose sensor kit  1 each by Other route every fourteen (14) days. Insert 1 sensor SQ q14 days; give #2 (30 day supply) or #6 (90 day supply) per pt preference     furosemide 20 MG tablet  Commonly known as: LASIX  Take 2 tablets (40 mg total) by mouth daily.     gabapentin 400 MG capsule  Commonly known as: NEURONTIN  Take 1 capsule (400 mg total) by mouth two (2) times a day.     hydrOXYzine 50 MG capsule  Commonly known as: VISTARIL     insulin syringe-needle U-100 1 mL 31 gauge x 5/16 (8 mm) Syrg  Use once as needed for Hydrocortisone injection.Marland Kitchen     lactulose 10 gram/15 mL solution  Take 30 mL (20 g total) by mouth Three (3) times a day.     lisinopriL 2.5 MG tablet  Commonly known as: PRINIVIL,ZESTRIL  Take 2.5 mg by mouth daily.     ondansetron 4 MG disintegrating tablet  Commonly known as: ZOFRAN-ODT  TAKE 1 TABLET BY MOUTH EVERY 6 HOURS FOR 7 DAYS     OZEMPIC 1 mg/dose (4 mg/3 mL) Pnij injection  Generic drug: semaglutide  Inject 1 mg under the skin every seven (7) days.     pantoprazole 40 MG tablet  Commonly known as: PROTONIX  Take 1 tablet (40 mg total) by mouth daily.     pen needle, diabetic 32 gauge x 5/32 (4 mm) Ndle  Commonly known as: BD ULTRA-FINE NANO PEN NEEDLE  ok to sub any brand or size needle preferred by insurance/patient, use 1-2x/day, dx E11.65     rifAXIMin 550 mg Tab  Commonly known as: XIFAXAN  Take 1 tablet (550 mg total) by mouth Two (2) times a day.     sodium chloride 0.65 % Drop  Commonly known as: AYR  1 spray.     spironolactone 25 MG tablet  Commonly known as: ALDACTONE  Take 2 tablets (50 mg total) by mouth daily.     syringe with needle 3 mL 23 x 1 Syrg  Use as needed for Hydrocortisone injection     TRESIBA FLEXTOUCH U-100 100 unit/mL (3 mL) Inpn  Generic drug: insulin degludec  Inject 0.3 mL (30 Units total) under the skin at bedtime. Adjust as instructed.            Allergies:  Venom-honey bee  ______________________________________________________________________  Pending Test Results:  Pending Labs     Order Current Status    C. Difficile Assay Collected (10/24/21 1201)    GI Pathogen Panel (instead of Stool O&P) Collected (10/24/21 1201)    Body fluid, pathologist review In process    Cytology - Fluid In process    Ascitic/Peritoneal Fluid Culture Preliminary result          Most Recent Labs:  All lab results last 24 hours -   Recent  Results (from the past 24 hour(s))   Albumin Level, Body Fluid    Collection Time: 10/24/21 12:01 PM   Result Value Ref Range    Albumin, Fluid <0.5 Undefined g/dL    Albumin, Fluid Type Fluid, Ascitic    Amylase, body fluid    Collection Time: 10/24/21 12:01 PM   Result Value Ref Range    Amylase, Fluid <20 Undefined U/L    Amylase, Fluid Type Fluid, Ascitic    Body fluid cell count    Collection Time: 10/24/21 12:01 PM   Result Value Ref Range    Fluid Type Fluid, Ascitic     Color, Fluid Yellow     Appearance, Fluid Hazy     Nucleated Cells, Fluid 217 Undefined ul    RBC, Fluid 2,040 ul    Neutrophil %, Fluid 15.0 %    Lymphocytes %, Fluid 42.0 %    Mono/Macro % , Fluid 39.0 %    Other Cells %, Fluid 4.0 %    #Cells Counted BF Diff 100     Fluid Comments Macrophages and mesothelial cells present.      Protein, Body Fluid    Collection Time: 10/24/21 12:01 PM   Result Value Ref Range    Protein, Fluid <2.0 g/dL    Protein, Fluid Type Fluid, Ascitic    Glucose, Body Fluid    Collection Time: 10/24/21 12:01 PM   Result Value Ref Range    Glucose, Fluid 172 Undefined mg/dL    Glucose, Fluid Type Fluid, Ascitic    Lactate Dehydrogenase, Body Fluid    Collection Time: 10/24/21 12:01 PM   Result Value Ref Range    LDH, Fluid 37 Undefined U/L    LD, Fluid Type Fluid, Ascitic    Ascitic/Peritoneal Fluid Culture    Collection Time: 10/24/21 12:01 PM    Specimen: Peritoneum; Fluid, Ascitic   Result Value Ref Range    Ascitic/Peritoneal Culture NO GROWTH TO DATE    POCT Glucose    Collection Time: 10/24/21 12:58 PM   Result Value Ref Range    Glucose, POC 172 70 - 179 mg/dL   POCT Glucose    Collection Time: 10/24/21  5:10 PM Result Value Ref Range    Glucose, POC 141 70 - 179 mg/dL   POCT Glucose    Collection Time: 10/24/21  8:15 PM   Result Value Ref Range    Glucose, POC 128 70 - 179 mg/dL   Basic metabolic panel    Collection Time: 10/25/21  8:34 AM   Result Value Ref Range    Sodium 139 135 - 145 mmol/L    Potassium 4.9 (H) 3.4 - 4.8 mmol/L    Chloride 107 98 - 107 mmol/L    CO2 26.0 20.0 - 31.0 mmol/L    Anion Gap 6 5 - 14 mmol/L    BUN 21 9 - 23 mg/dL    Creatinine 1.61 (H) 0.60 - 1.10 mg/dL    BUN/Creatinine Ratio 12     eGFR CKD-EPI (2021) Male 46 (L) >=60 mL/min/1.60m2    Glucose 96 70 - 179 mg/dL    Calcium 8.6 (L) 8.7 - 10.4 mg/dL   Magnesium Level    Collection Time: 10/25/21  8:34 AM   Result Value Ref Range    Magnesium 1.7 1.6 - 2.6 mg/dL   CBC    Collection Time: 10/25/21  8:34 AM   Result Value Ref Range    WBC 3.7 3.6 - 11.2 10*9/L  RBC 2.56 (L) 4.26 - 5.60 10*12/L    HGB 8.9 (L) 12.9 - 16.5 g/dL    HCT 16.1 (L) 09.6 - 48.0 %    MCV 100.4 (H) 77.6 - 95.7 fL    MCH 34.7 (H) 25.9 - 32.4 pg    MCHC 34.6 32.0 - 36.0 g/dL    RDW 04.5 (H) 40.9 - 15.2 %    MPV 10.9 (H) 6.8 - 10.7 fL    Platelet 53 (L) 150 - 450 10*9/L   Phosphorus Level    Collection Time: 10/25/21  8:34 AM   Result Value Ref Range    Phosphorus 3.8 2.4 - 5.1 mg/dL   POCT Glucose    Collection Time: 10/25/21  8:36 AM   Result Value Ref Range    Glucose, POC 91 70 - 179 mg/dL       Relevant Studies/Radiology:  - CXR:  -Small right pleural effusion with adjacent right lower lobe collapse.  -Trace left pleural effusion with minimal left basilar atelectasis.  ______________________________________________________________________  Discharge Instructions:   Activity Instructions     Activity as tolerated        You were seen at [facility] for worsening nausea, vomiting, and diarrhea as well as shortness of breath. You were found to have a small amount of fluid in your lungs, which likely explains your shortness of breath. You had a lot of fluid retained in your belly; this fluid was negative for infection. You also had an acute kidney injury, which prevented [place] from safely taking more fluid than was necessary for diagnostic testing to make you more comfortable. If you continue to have distention and discomfort from this fluid, reach out to Dr. Waynetta Sandy to schedule a large volume paracentesis.     No medication changes were made during this visit, continue to take your home medications as prescribed.      Appointments which have been scheduled for you    Oct 31, 2021 11:00 AM  (Arrive by 10:30 AM)  RETURN HCP TELEPHONE with Smiley Houseman  [facility] LIVER TRANSPLANT [facility]) [address]  [place] [address]  [phone]      Nov 26, 2021  1:00 PM  (Arrive by 12:45 PM)  RETURN  HEPATOLOGY with Marene Lenz, MD  [facility] GI MEDICINE [place] [facility]) [address]  [place] [address]  [phone]      Dec 17, 2021  9:20 AM  (Arrive by 9:05 AM)  RETURN  DIABETES with Arelia Longest, MD  [facility] DIABETES AND ENDOCRINOLOGY [place] [facility]) [address]  [place] [address]  [phone]      Jan 03, 2022  9:00 AM  (Arrive by 8:45 AM)  US ABDOMEN COMPLETE with HBR [place] RM 1  IMG ULTRASOUND [facility]) [address]  [place] [address]  [phone]   Preparing for the appointment:  Fasting before the appointment  You must refrain from eating or drinking prior to the appointment. However, if you are taking medications please take them as usual on the day of the procedure with a small sip of water.  Before the appointment  63-28 years old: Do not eat or drink anything for three (3) hours prior to the study.  5 years and older: Do not eat or drink anything for six (6) hours prior to the study.     Feb 26, 2022  11:45 AM  (Arrive by 11:30 AM)  MRI ABDOMEN W WO CONTRA    -UN with HBR MRI RM 1  IMG MRI [facility]) [address]  [place] [address]  [phone]   On appt date:  Bring recent lab work  Bring documentation of any metal object implants  Take meds as usual  Check w/physician if diabetic  You will be asked to change into a gown for your safety    On appt date do not:  Consume anything 2 hrs  Wear metallic items including jewelry (we are not responsible for lost items)    Let us know if pt:  Claustrophobic  Metal object implant  Pregnant  Prescribed a sedative  On dialysis  Allergic to MRI dye/contrast  Kidney Failure    (Title:MRIWCNTRST)          ______________________________________________________________________  Discharge Day Services:  BP 123/72  - Pulse 72  - Temp 36.6 ??C (97.8 ??F) (Oral)  - Resp 16  - Ht 172.7 cm (5' 8)  - Wt 100.7 kg (222 lb 0.1 oz)  - SpO2 97%  - BMI 33.76 kg/m??     Pt seen on the day of discharge and determined appropriate for discharge.    Condition at Discharge: fair    Length of Discharge: I spent less than 30 mins in the discharge of this patient.    Matthew Key, MS4  October 25, 2021 6:44 AM    I attest that I have reviewed the student note and that the components of the history of the present illness, the physical exam, and the assessment and plan documented were performed by me or were performed in my presence by the student where I verified the documentation and performed (or re-performed) the exam and medical decision making.    Dickie La, MD PGY-1   [facility] Internal Medicine

## 2021-10-25 NOTE — Unmapped (Addendum)
Matthew Key is a 61 y.o. 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    Nausea, Vomiting, Diarrhea: This improved after admission. No hematemesis, hematochezia, or melena reported or observed. No sick contacts, fevers, chills, abdominal pain. Workup negative for pancreatitis and SBP. Unclear etiology of acute worsening, GIPP and C. Diff were still pending at time of discharge. Likely viral in nature.     AKI - Hyperkalemia: Cr elevated to 1.88 at admission, improved towards baseline by discharge. No treatment required for hyperkalemia.     Acute decompensated Cryptogenic Cirrhosis - Hx of Esophageal Varices: No asterixis on exam. C/o increased abdominal distention despite taking his prescribed home lasix and spironolactone and adequate urine output daily. No LVP in ~60yrs, plan was to do this outpatient. No LVP done inpatient due to AKI. Workup negative for SBP. Diuretics and other BP meds were held during admission and patient had some episodes of hypotension that resolved with albumin boluses.     MELD from admission:   MELD-Na score: 21 at 10/24/2021  7:22 AM  MELD score: 21 at 10/24/2021  7:22 AM  Calculated from:  Serum Creatinine: 1.63 mg/dL at 1/61/0960  4:54 AM  Serum Sodium: 137 mmol/L at 10/24/2021  7:22 AM  Total Bilirubin: 2.9 mg/dL at 0/98/1191  4:78 PM  INR(ratio): 1.65 at 10/23/2021  4:15 PM  Age: 71 years    Pleural Effusion - Atelectasis: SOB on admission, remained stable on RA throughout. Bilateral trace pleural effusions (R>L) and adjacent atelectasis noted on CXR. BNP normal. Incentive spirometry was encouraged throughout stay.

## 2021-10-25 NOTE — Unmapped (Signed)
Medicine Procedure Service - New Procedure Request Triage    Procedure Requested: Large volume paracentesis  Indication: Ascites  Urgency: Within next 24 hours    Questions for all procedures    1. Is the patient able to give consent for this procedure? - Yes  2. If no, has family to give consent been identified? - NA  3. Safe to give peri-procedural anxiolysis? (i.e, lorazepam 1 mg IV; if so, team or M can order) - Yes  4. Has team placed med procedure consult order? (If not, please remind team to do so) - Yes  5. Is patient currently on anticoagulation? No     INR   Date Value Ref Range Status   10/23/2021 1.65  Final   09/26/2021 1.6 (H) 0.9 - 1.2 Final     Comment:     Reference interval is for non-anticoagulated patients.  Suggested INR therapeutic range for Vitamin K  antagonist therapy:     Standard Dose (moderate intensity                    therapeutic range):       2.0 - 3.0     Higher intensity therapeutic range       2.5 - 3.5       PT   Date Value Ref Range Status   10/23/2021 19.0 (H) 9.8 - 12.8 sec Final     APTT   Date Value Ref Range Status   08/25/2021 39.2 (H) 25.1 - 36.5 sec Final     Platelet   Date Value Ref Range Status   10/25/2021 53 (L) 150 - 450 10*9/L Final   07/09/2021 57 (LL) 150 - 450 x10E3/uL Final     Large Volume Paracentesis    Any severe bleeding concerns? (this is low risk procedure for bleeding, see attached guideline; DIC is a strict contra-indication) - No  Any maximum volume? (team should consider limiting to 2-3L if has known SBP or AKI, especially if concern for HRS) - No limitation  Does team want albumin given? (advise we always give in patients with cirrhosis if we remove >5L, but some providers give at lower volumes also) - Yes (50g of 25)  Does team want studies? If so, team should order. No  Are any studies other than routine rule-out-SBP labs needed? Such as cytology or AFB culture? No      Medicine Procedure Service  Guidelines for Peri-procedural Management of Bleeding Risk   Low Bleeding Risk  Paracentesis  Thoracentesis  I&D  CVC  Arthrocentesis   Moderate-High Bleeding Risk  Lumbar puncture*   INR <2-3, N/a for cirrhosis <2   Platelet >20 >50        ASA, low dose Not required to hold Do not hold   ASA, high dose Not required to hold Stop 5 days before   Clopidogrel Not required to hold Stop 5 days before   Ticagrelor Not required to hold Stop 5 days before   Prasugrel Not required to hold Stop 7 days before        UFH Not required to hold Stop 2-4 hours before   LMWH (prophylactic) Not required to hold Stop 12 hours before   LMWH (treatment) Not required to hold Stop 24 hours before        Warfarin INR < 3 Stop until INR < 1.8, consider 4 factor prothrombin complex concentrate with heme consult   Apixaban (Eliquis), BID Not required to hold Hold 4  doses (GFR > 50) or 6 doses (GFR <30-50). If urgent, see table below and consider heme consult.   Dabigatran (Pradaxa), BID Not require to hold Hold 5-6 doses (GFR > 50) or 7-8 doses (GFR < 30-50).  If urgent, see table below and consider heme consult.   Rivaroxaban (Xarelto), maintenance once daily Not required to hold Hold for 2 doses (GFR > 30) or 3 doses (GFR < 30).  If urgent, see table below and consider heme consult.     ??? 2019 SIR guidelines now consider LP to be a low-risk procedure, was a moderate risk procedure in the 2012 guidelines. [facility] 2015 considers LP high risk, as does MD Dareen Piano 2017 guidelines; both available online  ??? While thoracentesis is considered a low-risk procedure, discuss with IP if patient is on antiplatelets (other than ASA) or DOAC  ??? Adapted from the 2019 Society of Interventional Radiology Consensus Guidelines: https://www.jvir.org/article/S1051-0443(19)30407-5/pdf  ??? While thoracentesis and paracentesis may not require holding anticoagulation, if safe and convenient to hold, doing so may reduce risk for hemorrhage if a vessel is inadvertently punctured      This is the table from [facility] Hematology for management when undergoing an urgent procedure - Lumbar punctures are considered MODERATE RISK

## 2021-10-29 DIAGNOSIS — K7210 Chronic hepatic failure without coma: Principal | ICD-10-CM

## 2021-10-31 ENCOUNTER — Ambulatory Visit: Admit: 2021-10-31 | Payer: MEDICARE

## 2021-10-31 NOTE — Unmapped (Incomplete)
I spent 65 minutes on the phone with the patient on the date of service. I spent an additional 20 minutes on pre- and post-visit activities on the date of service.     The patient was physically located in [place] or a state in which I am permitted to provide care. The patient and/or parent/guardian understood that s/he may incur co-pays and cost sharing, and agreed to the telemedicine visit. The visit was reasonable and appropriate under the circumstances given the patient's presentation at the time.    The patient and/or parent/guardian has been advised of the potential risks and limitations of this mode of treatment (including, but not limited to, the absence of in-person examination) and has agreed to be treated using telemedicine. The patient's/patient's family's questions regarding telemedicine have been answered.     If the visit was completed in an ambulatory setting, the patient and/or parent/guardian has also been advised to contact their provider???s office for worsening conditions, and seek emergency medical treatment and/or call 911 if the patient deems either necessary.    **THIS PATIENT WAS NOT SEEN IN PERSON TO MINIMIZE POTENTIAL SPREAD OF COVID-19, PROTECT PATIENTS/PROVIDERS, AND REDUCE PPE UTILIZATION.**    PATIENT NAME: Matthew Key                                         MR#: [id number]                   DOB: Oct 15, 1960  ??  ??  [facility]  CONFIDENTIAL SOCIAL WORK  LIVER TRANSPLANT INITIAL ASSESSMENT   ??  DATE OF EVALUATION: 10/31/21  ??  INFORMANTS: Patient/Matthew Key  ??  PREFERRED LANGUAGE: English   ??  PRESENTING MEDICAL PROBLEMS AND RELEVANT HISTORY:   Matthew Key is a 60 y.o. Caucasian malewho  has a past medical history of AKI (acute kidney injury) (CMS-HCC) (12/14/2020), Anxiety (10/22/2013), Arthritis, Cervical radiculopathy (12/03/2016), Chronic pain disorder, Cirrhosis (CMS-HCC), Dental abscess (10/2020), Duodenal ulcer (12/01/2017), GERD (gastroesophageal reflux disease), History of transfusion, Hyperlipidemia (10/22/2013), Hypertension, Liver disease, Sleep apnea, obstructive, Stroke (CMS-HCC), and Type 2 diabetes mellitus with diabetic neuropathy, with long-term current use of insulin (CMS-HCC) (06/09/2014).      TRANSPLANT PHASE:  Waitlist   TRANSPLANT STATUS:  Active     Matthew Key presents today for an updated liver transplant evaluation.   ??  FUNCTIONAL STATUS:   Matthew Key presents as independent with all personal ADLs, including bathing, dressing, cooking, and household chores. Patient is driving.    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  UNDERSTANDING OF MEDICAL CONDITION AND TRANSPLANT:   Matthew Key and his spouse/Matthew attended the Transplant Orientation class on 05/19/18 in [place], [place].    ??  Pt understands transplant process: well  Participated in TeachBack method: well  Information recall: hospitalization,  lab work, follow up appointments  Education provided: caregiver expectations  ??  Briefly reviewed the evaluation and listing process.  Reviewed the potential surgical complications of transplant such as but not limited to bleeding, blood clots, hernia, infection, rejection and death.  In addition, patient informed of the potential psychological complications of chronic illness such as depression, anxiety, guilt and PTSD and advised that it is not uncommon for patients with a previous history of such to experience an increase in symptoms should they have a decline in their health.  Matthew Key voiced his understanding.    ??  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 Got a call for back up liver offer   ??  COGNITIVE FUNCTIONING/HEALTH LITERACY: Matthew Key does  seem cognitively intact. ***couldn't remember who he was. Taking lactulose everyday hepatic encephalopathy related to his liver disease. He uses a CPAP machine and reports that this has helped him and no longer taking melatonin; however, he reports being prescribed a new sleep medication (did not recall name).  ***sleeping okay  ??  ATTITUDE ABOUT TRANSPLANT:   Patient desires transplant: yes  Expectations: extension of life, be 'as close to normal and feel good'    Fears/Concerns: denies, just be too sick for transplant, liver failing  ??  LIVING SITUATION AND FAMILY/SOCIAL SUPPORTS:   Citizenship Status: [place] Citizen  Social History: originally from [place], [place]  Marital Status: married since 1999  Lives with: spouse and 71 year old daughter/Olivia   Children/Dependents:  24yr old daughter, 42 year old son, and 22 year old granddaughter  Extended family: brother and 2 sisters; reports a 'good' relationship; father passed in 2020, mother still living    ??  Matthew Key reports feeling well supported at home by his friends and family.  ??  COMPLIANCE HISTORY:   Medication list available: EPIC list not available  Problems obtaining medications:                  Current: denies                Past: denies  Problems organizing medications: denies  Diet regime: tries to watch what he eats, tries to be mindful *** low sodium, + protein    ***[facility] 2x/week   Barriers to attending medical care: denies  ??  SUBSTANCE HISTORY:    Cigarette smoking:                Current: denies                Past: denies  Other tobacco/nicotine use:                Current: denies                Past: prior to 1990's he smoked occasional cigars   Alcohol use: Current: denies                Past: reports monthly use up until diagnosis in April 2019; denied abuse   Illicit drug use:                Current: denies                Past: teenage use of marijuana recreationally   ??  CHRONIC PAIN HISTORY:    Regular use of pain medications: gabapentin but does not help back pain ***  Medications/Prescriber: PCP   Involvement with pain clinic: n/a   ??  LEGAL HISTORY:   Matthew Key denies current or past history of legal issues, including jail/probation time, litigation, bankruptcy, gambling, or any other legal problems.   ??  A review of [place] DPS Offender Public Information website revealed no history of criminal charges.  ??  PSYCHIATRIC HISTORY:   Current issues: denies  Past issues: 2019 PCP suggested therapy to address pt's health issues along with his father's worsening dementia and patient declined; noted to have situational depression and anxiety; noted baseline irritability in interview with patient in December 2019 from Transplant Psychologist with possible marital strain   ***doing better, hasn't gotten a chance to talk about meds  Medications:                 Current: denies                             Past: anti-anxiety medication prior to MRI's   Therapy:                 Current: denies                Past: denies  SI/HI:                Current: denies                Past: denies  Hospitalizations:                Current: denies                Past: denies  Loss/Trauma/Violence:                Current: denies ***lost brother a year or so ago, lost mom                Past: 2020 loss of father due to dementia     PHQ2 - 0  GAD7 - 0  ??  COPING STYLE:   Current Stressors: denied   Coping Skills/Mechanisms: think of how grateful he is which allows him to put things in perspective  ??  ADVANCE DIRECTIVES:   Current AD/HCPOA: denies                On file with [place]:  denies  Desired healthcare agent(s): spouse/Matthew   ??  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  FINANCIAL RESOURCES:   Current income sources: patient's SSDI, 1843/mo, receives food stamps  300/mo, rent is 700 month  Monthly expenses: customary monthly expenses, rent, cable, phone  Risk of losing home/transportation: denies  Hx of significant debts: no debt; paying on medical bills   Current income meets basic needs: Yes 'barely'  Fundraising efforts: discussed and provided resources   ??  MEDICAL/PRESCRIPTION COVERAGE:   Insurance:                 Primary Coverage: [employer]                  Secondary Coverage: none                Tertiary Coverage: none  Problems with access/coverage of medical  care:  denies  ??  ACCESS TO TRANSPORTATION:    Patient drives: yes  Access to reliable transportation: yes  Other sources of transportation:  spouse/Matthew; daughter/Olivia   Dialysis transportation: n/a  ??  RELIGIOUS/SPIRITUAL AFFILIATION:   Patient identifies as: denies  Impact on healthcare: denies  ??  HOME HEALTH/DME AGENCY:   Current: denies  Past: denies   Access to the following DME (not in current use): None  Home Modifications: None  ??  PLAN FOR TRANSPLANTATION:   Discussed expectations for pre/post caregiving needs with  patient and spouse.  Discussed the projected length of hospitalization , need for 24 hour supervision at the time of discharge, inability to drive for a 2-3 month period, need for lab work three times a week for the first two months after transplant and less often thereafter, need for weekly follow up at [facility] for the first month after transplant and less often thereafter and the lifetime need for medication and compliance in all areas.   ??  Discussed importance of having alternative/secondary caregivers in the event that primary support people are not available at the time of transplant.  Discussed that patients with any mobility issues prior to transplant could need more physical assistance after abdominal surgery and/or could be at a higher risk of becoming deconditioned more quickly.  They indicated that they understood.    ??  Current Plan: Matthew Key states that his wife, Matthew Key continues to be the identified primary caregiver. Matthew Key works part-time and reports that she has cared for mother who passed from cancer and 'if I can do that, I can care for him'. Matthew Key does not drive (never had a driver's license) but is willing to attend his appointments with him. Mr. Gin identifies his mother, Matthew Key as a back-up caregiver along with someone that can provide transportation. Lastly, they agree that their adult daughter, Matthew Key and son/DIL will be actively involved as back-up caregivers as needed.    ??  Primary Support: Matthew Key  Relationship to patient: wife  Age/DOB: 33  Education Level: 12th grade   Employment: part time   Health: denies, healing up good (breast reduction,   Support limitations: employment and health, does not drive/never had a Designer, industrial/product strengths: historical caregiver and lives in the home  ??Matthew Key - does not drive.   Wife has appts, f/up, scan,     Sister/ Matthew Key - can be in hospital,    Son/Matthew Key - can stay, lives in [place].   ??[name]' : works at the hospital. Between all of [place], sister, daughter. 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 Matthew Key will be in the one in the house. 15 min away.   [place]  ??  TRAVEL TIME TO HOSPITAL: Approximately 1.5 hours via car to [facility].  ??  EDUCATION: Verbal education on the following topics provided to patient and spouse :  Advance Care Planning  Fundraising for Transplant-related expenses  Long-term financial and vocational planning  Expectations for support planning both pre- and post-transplant  Common experience for LIVER patients to have depression and/or anxiety   Transplant social worker availability throughout transplant process  ??  Written education or resource material provided on the following topics:  Administrator, sports of Attorney Forms  [employer] and other financial resources  Working while Disabled  [employer] for disability and Medicare   ??  Mr. Mccollum and spouse indicated that they understood all  education provided and did ask appropriate questions.   ??  COLLATERAL CONTACT: Patient's spouse/Matthew Key was present for the duration of interview and validated all information, including post-transplant support plan.   ??  ASSESSMENT AND RECOMMENDATIONS:   Jvon Meroney is a 61 y.o. 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 His cirrhosis has been complicated by ascites, hepatic encephalopathy and nonbleeding esophageal varices. Patient was internally approved but lost financial clearance - apparently this is getting worked out.  He has been internally listed at [facility] for liver transplant. This is his annual psychosocial update.     Mr. Slomski medical status and overall psychosocial situation seems to have improved since our initial consult in 2019. He is not anticipating a transplant in the near future and is still hopeful that he will not need one.     He reports that his mood has been more stable as he has had time process his health. He affirms that he has continued to abstain from any alcohol or nicotine products.    He states that he has been adherent with his diet and medication; however, per Epic he has gained 15# and it is recommended that this closely be monitored. He shares that he is intending on returning to the gym and his weight gain was following an arthroscopic knee procedure. He confirms he is adherent with his CPAP.     He is receiving financial assistance from the state of [place] to pay his rent and utilities via the HOPE Act and reports that they are able to meet their basic needs. Mr. Huhn was reminded to apply for disability via Social Security and file for benefits at the [place]. He admits that he needs to follow though with these things and says, 'I just haven't...' We discussed the financial assistance that disability could provide even with him considering trying to seek part-time employment.     Mr. Mall continues to demonstrate stability regarding his caregiving plan. Matthew/wife and patients mother are the two main people identified in caring for his identified needs post transplant. Matthew Key does not drive and they share that both their adult children can assist with transportation if needed.   ??  At this time, this CSW cannot identify any psychosocial barriers that would impact consideration for the transplant active listing.      SIPAT: 22; minimally acceptable candidate   ??  Recommendations:   1. Apply for VA benefits   2. Apply for disability, Medicare   ??  Final decision regarding listing status is based upon committee review at selection meeting.    Loletta Specter, LCSW, CCTSW  Transplant Case Manager

## 2021-11-03 LAB — PROTIME-INR
INR: 1.6 — ABNORMAL HIGH (ref 0.9–1.2)
PROTHROMBIN TIME: 16.7 s — ABNORMAL HIGH (ref 9.1–12.0)

## 2021-11-03 LAB — COMPREHENSIVE METABOLIC PANEL
A/G RATIO: 0.9 — ABNORMAL LOW (ref 1.2–2.2)
ALBUMIN: 2.8 g/dL — ABNORMAL LOW (ref 3.8–4.8)
ALKALINE PHOSPHATASE: 128 IU/L — ABNORMAL HIGH (ref 44–121)
ALT (SGPT): 21 IU/L (ref 0–44)
AST (SGOT): 42 IU/L — ABNORMAL HIGH (ref 0–40)
BILIRUBIN TOTAL (MG/DL) IN SER/PLAS: 2.5 mg/dL — ABNORMAL HIGH (ref 0.0–1.2)
BLOOD UREA NITROGEN: 22 mg/dL (ref 8–27)
BUN / CREAT RATIO: 14 (ref 10–24)
CALCIUM: 8.8 mg/dL (ref 8.6–10.2)
CHLORIDE: 109 mmol/L — ABNORMAL HIGH (ref 96–106)
CO2: 24 mmol/L (ref 20–29)
CREATININE: 1.52 mg/dL — ABNORMAL HIGH (ref 0.76–1.27)
GLOBULIN, TOTAL: 3.1 g/dL (ref 1.5–4.5)
GLUCOSE: 133 mg/dL — ABNORMAL HIGH (ref 70–99)
POTASSIUM: 4.4 mmol/L (ref 3.5–5.2)
SODIUM: 143 mmol/L (ref 134–144)
TOTAL PROTEIN: 5.9 g/dL — ABNORMAL LOW (ref 6.0–8.5)

## 2021-11-03 LAB — CBC
HEMATOCRIT: 26.3 % — ABNORMAL LOW (ref 37.5–51.0)
HEMOGLOBIN: 9.6 g/dL — ABNORMAL LOW (ref 13.0–17.7)
MEAN CORPUSCULAR HEMOGLOBIN CONC: 36.5 g/dL — ABNORMAL HIGH (ref 31.5–35.7)
MEAN CORPUSCULAR HEMOGLOBIN: 34.5 pg — ABNORMAL HIGH (ref 26.6–33.0)
MEAN CORPUSCULAR VOLUME: 95 fL (ref 79–97)
PLATELET COUNT: 58 10*3/uL — CL (ref 150–450)
RED BLOOD CELL COUNT: 2.78 x10E6/uL — ABNORMAL LOW (ref 4.14–5.80)
RED CELL DISTRIBUTION WIDTH: 14.9 % (ref 11.6–15.4)
WHITE BLOOD CELL COUNT: 4.3 10*3/uL (ref 3.4–10.8)

## 2021-11-05 DIAGNOSIS — K7210 Chronic hepatic failure without coma: Principal | ICD-10-CM

## 2021-11-11 MED ORDER — ONDANSETRON 4 MG DISINTEGRATING TABLET
ORAL_TABLET | Freq: Three times a day (TID) | ORAL | 11 refills | 24 days | Status: CP | PRN
Start: 2021-11-11 — End: 2022-11-06

## 2021-11-11 NOTE — Unmapped (Signed)
Patient is struggling to take lactulose as prescribed due nausea.  Verbal order with readback from on call hepatologist Dr. Waynetta Sandy for Zofran.

## 2021-11-12 DIAGNOSIS — K7210 Chronic hepatic failure without coma: Principal | ICD-10-CM

## 2021-11-12 NOTE — Unmapped (Signed)
Error

## 2021-11-12 NOTE — Unmapped (Signed)
Pt's wife had left TNC VM x 2 on Sat stating pt was in rough shape and she was concerned.  Per chart review, on call coordinator spoke with pt yesterday and provided him with zofran Rx.  Today pt states he is feeling much better and plans to get labs drawn.  Reports hand tremors have improved, no vomiting, thoughts are clear and he is having regular BMs.  Pt and his wife aware to contact on-call pager when unable to reach primary TNC, but pt decompensated quickly an wife panicked. Denies ascites and has upcoming appt with [name] 4/17 @ [facility].

## 2021-11-13 LAB — COMPREHENSIVE METABOLIC PANEL
A/G RATIO: 0.8 — ABNORMAL LOW (ref 1.2–2.2)
ALBUMIN: 2.9 g/dL — ABNORMAL LOW (ref 3.8–4.8)
ALKALINE PHOSPHATASE: 125 IU/L — ABNORMAL HIGH (ref 44–121)
ALT (SGPT): 16 IU/L (ref 0–44)
AST (SGOT): 46 IU/L — ABNORMAL HIGH (ref 0–40)
BILIRUBIN TOTAL (MG/DL) IN SER/PLAS: 3.6 mg/dL — ABNORMAL HIGH (ref 0.0–1.2)
BLOOD UREA NITROGEN: 9 mg/dL (ref 8–27)
BUN / CREAT RATIO: 7 — ABNORMAL LOW (ref 10–24)
CALCIUM: 9.6 mg/dL (ref 8.6–10.2)
CHLORIDE: 107 mmol/L — ABNORMAL HIGH (ref 96–106)
CO2: 22 mmol/L (ref 20–29)
CREATININE: 1.22 mg/dL (ref 0.76–1.27)
GLOBULIN, TOTAL: 3.5 g/dL (ref 1.5–4.5)
GLUCOSE: 154 mg/dL — ABNORMAL HIGH (ref 70–99)
POTASSIUM: 3.8 mmol/L (ref 3.5–5.2)
SODIUM: 140 mmol/L (ref 134–144)
TOTAL PROTEIN: 6.4 g/dL (ref 6.0–8.5)

## 2021-11-13 LAB — CBC
HEMATOCRIT: 27.8 % — ABNORMAL LOW (ref 37.5–51.0)
HEMOGLOBIN: 10 g/dL — ABNORMAL LOW (ref 13.0–17.7)
MEAN CORPUSCULAR HEMOGLOBIN CONC: 36 g/dL — ABNORMAL HIGH (ref 31.5–35.7)
MEAN CORPUSCULAR HEMOGLOBIN: 33 pg (ref 26.6–33.0)
MEAN CORPUSCULAR VOLUME: 92 fL (ref 79–97)
PLATELET COUNT: 81 10*3/uL — CL (ref 150–450)
RED BLOOD CELL COUNT: 3.03 x10E6/uL — ABNORMAL LOW (ref 4.14–5.80)
RED CELL DISTRIBUTION WIDTH: 13.7 % (ref 11.6–15.4)
WHITE BLOOD CELL COUNT: 3.6 10*3/uL (ref 3.4–10.8)

## 2021-11-13 LAB — PROTIME-INR
INR: 1.7 — ABNORMAL HIGH (ref 0.9–1.2)
PROTHROMBIN TIME: 17.5 s — ABNORMAL HIGH (ref 9.1–12.0)

## 2021-11-13 MED ORDER — LACTULOSE 10 GRAM/15 ML ORAL SOLUTION
Freq: Three times a day (TID) | ORAL | 0 refills | 30.00000 days | Status: CN
Start: 2021-11-13 — End: 2021-12-13

## 2021-11-13 MED ORDER — CARVEDILOL 3.125 MG TABLET
ORAL_TABLET | Freq: Two times a day (BID) | ORAL | 0 refills | 30.00000 days | Status: CN
Start: 2021-11-13 — End: 2021-12-13

## 2021-11-13 MED ORDER — GABAPENTIN 400 MG CAPSULE
ORAL_CAPSULE | Freq: Two times a day (BID) | ORAL | 0 refills | 30.00000 days | Status: CN
Start: 2021-11-13 — End: 2021-12-13

## 2021-11-13 NOTE — Unmapped (Signed)
Clinical Social Worker Telephone Note    Name:Sudeep Jeriel Vivanco  Date of Birth:10/08/1960  UJW:[id number]    REFERRAL INFORMATION:  Tristian Sickinger is waitlisted for liver transplantation . CSW follows up to assess financial questions/planning.    IMPRESSION: Received bill payment request from patient. Documents sent are not sufficient for rent and car insurance. Will wait to submit request until all corrected documents are received. Provided email and phone number for documents to be sent.       Flint Melter, LCSW  Transplant Case Manager  11/13/2021. 1:09 PM

## 2021-11-13 NOTE — Unmapped (Signed)
The [facility] has made a second and final attempt to reach this patient to refill the following medication: Xifaxan.      We have left voicemail with patient at the following phone numbers: [phone], have been unable to leave messages on the following phone numbers: [phone], have sent a MyChart message and have sent a text message to the following phone numbers: [phone].    Dates contacted: 3/29, 4/4  Last scheduled delivery: shipped 1/20    The patient may be at risk of non-compliance with this medication. The patient should call the [facility] at [phone]  Option 4, then Option 2 (all other specialty patients) to refill medication.    Matthew Key   [facility] Specialty Technician

## 2021-11-13 NOTE — Unmapped (Signed)
[facility] Refill Coordination Note    Specialty Medication(s) to be Shipped:   Infectious Disease: Xifaxan    Other medication(s) to be shipped: lactulose,gabapentin ,carvedilol     [name], DOB: [birth year]  Phone: [phone] (home)       All above HIPAA information was verified with patient.     Was a Nurse, learning disability used for this call? No    Completed refill call assessment today to schedule patient's medication shipment from the [facility] [phone]).  All relevant notes have been reviewed.     Specialty medication(s) and dose(s) confirmed: Regimen is correct and unchanged.   Changes to medications: Eivin reports no changes at this time.  Changes to insurance: No  New side effects reported not previously addressed with a pharmacist or physician: None reported  Questions for the pharmacist: No    Confirmed patient received a Conservation officer, historic buildings and a Surveyor, mining with first shipment. The patient will receive a drug information handout for each medication shipped and additional FDA Medication Guides as required.       DISEASE/MEDICATION-SPECIFIC INFORMATION        N/A    SPECIALTY MEDICATION ADHERENCE     Medication Adherence    Patient reported X missed doses in the last month: 0  Specialty Medication: xifaxan 550mg   Patient is on additional specialty medications: No  Patient is on more than two specialty medications: No  Any gaps in refill history greater than 2 weeks in the last 3 months: no  Demonstrates understanding of importance of adherence: yes  Informant: patient  Reliability of informant: reliable  Provider-estimated medication adherence level: good  Patient is at risk for Non-Adherence: No  Reasons for non-adherence: no problems identified              Were doses missed due to medication being on hold? No    Xifaxan 550 mg: 7 days of medicine on hand         REFERRAL TO PHARMACIST     Referral to the pharmacist: Not needed      [facility] Shipping address confirmed in Epic.     Delivery Scheduled: Yes, Expected medication delivery date: 04/06.     Medication will be delivered via UPS to the prescription address in Epic WAM.    Antonietta Barcelona   [facility] Specialty Technician

## 2021-11-16 NOTE — Unmapped (Signed)
Return call placed to pt. Mr Belton endorses weakness to upper extremities, wobbly gait, no asterixis. Had emesis x1 today (notes that it was meal from night before) and has only had one small BM yesterday and that he has been taking lactulose and Miralax. Pt encouraged to take q2h until BM and symptoms improve. Pt aware to contact On call coordinator over the weekend with any concerns. Pt has upcoming appt with [name] on 4/17 and sees PCP on Mon 4/11 for ongoing back pain. Pt frustrated with process of waiting for transplant and struggling with ongoing symptoms. Offered emotional support, pt appreciative of call.

## 2021-11-17 NOTE — Unmapped (Signed)
Insurance has been verified for 2023   ??  ??Patient??now??has active coverage with??[employer] Adv.??  ??  Authorization [id number] Liver transplant??listing valid until??01/09/22  ??  ??Patient is financially cleared for Liver transplant??listing

## 2021-11-19 DIAGNOSIS — K7210 Chronic hepatic failure without coma: Principal | ICD-10-CM

## 2021-11-20 NOTE — Unmapped (Addendum)
Call placed to check in on pt status. Pt states he went to PCP yesterday and was prescribed duloxetine for back pain (advised to stop gabapentin) and now reports he is constipated, slow speech noted, concern for increasing HE. Pt encouraged to take colace/Miralax, increase fluids and increase lactulose as needed for 3-5 BM/day goal. Pt verbalized understanding. Pt sees [name] in clinic 4/17. Provided emotional support, pt feeling discouraged with ongoing symptoms. Plans to have Meld labs drawn today.

## 2021-11-22 LAB — COMPREHENSIVE METABOLIC PANEL
A/G RATIO: 0.8 — ABNORMAL LOW (ref 1.2–2.2)
ALBUMIN: 2.6 g/dL — ABNORMAL LOW (ref 3.8–4.8)
ALKALINE PHOSPHATASE: 144 IU/L — ABNORMAL HIGH (ref 44–121)
ALT (SGPT): 16 IU/L (ref 0–44)
AST (SGOT): 36 IU/L (ref 0–40)
BILIRUBIN TOTAL (MG/DL) IN SER/PLAS: 2.9 mg/dL — ABNORMAL HIGH (ref 0.0–1.2)
BLOOD UREA NITROGEN: 8 mg/dL (ref 8–27)
BUN / CREAT RATIO: 6 — ABNORMAL LOW (ref 10–24)
CALCIUM: 8.6 mg/dL (ref 8.6–10.2)
CHLORIDE: 105 mmol/L (ref 96–106)
CO2: 23 mmol/L (ref 20–29)
CREATININE: 1.32 mg/dL — ABNORMAL HIGH (ref 0.76–1.27)
GLOBULIN, TOTAL: 3.1 g/dL (ref 1.5–4.5)
GLUCOSE: 157 mg/dL — ABNORMAL HIGH (ref 70–99)
POTASSIUM: 3.9 mmol/L (ref 3.5–5.2)
SODIUM: 135 mmol/L (ref 134–144)
TOTAL PROTEIN: 5.7 g/dL — ABNORMAL LOW (ref 6.0–8.5)

## 2021-11-22 LAB — PROTIME-INR
INR: 1.6 — ABNORMAL HIGH (ref 0.9–1.2)
PROTHROMBIN TIME: 16.2 s — ABNORMAL HIGH (ref 9.1–12.0)

## 2021-11-22 LAB — CBC
HEMATOCRIT: 27.8 % — ABNORMAL LOW (ref 37.5–51.0)
HEMOGLOBIN: 9.7 g/dL — ABNORMAL LOW (ref 13.0–17.7)
MEAN CORPUSCULAR HEMOGLOBIN CONC: 34.9 g/dL (ref 31.5–35.7)
MEAN CORPUSCULAR HEMOGLOBIN: 33 pg (ref 26.6–33.0)
MEAN CORPUSCULAR VOLUME: 95 fL (ref 79–97)
PLATELET COUNT: 58 10*3/uL — CL (ref 150–450)
RED BLOOD CELL COUNT: 2.94 x10E6/uL — ABNORMAL LOW (ref 4.14–5.80)
RED CELL DISTRIBUTION WIDTH: 14.4 % (ref 11.6–15.4)
WHITE BLOOD CELL COUNT: 3.7 10*3/uL (ref 3.4–10.8)

## 2021-11-26 ENCOUNTER — Ambulatory Visit: Admit: 2021-11-26 | Discharge: 2021-11-27 | Payer: MEDICARE

## 2021-11-26 DIAGNOSIS — K7210 Chronic hepatic failure without coma: Principal | ICD-10-CM

## 2021-11-26 DIAGNOSIS — K7460 Unspecified cirrhosis of liver: Principal | ICD-10-CM

## 2021-11-26 DIAGNOSIS — R197 Diarrhea, unspecified: Principal | ICD-10-CM

## 2021-11-26 DIAGNOSIS — Z944 Liver transplant status: Principal | ICD-10-CM

## 2021-11-26 LAB — CBC
HEMATOCRIT: 32.4 % — ABNORMAL LOW (ref 39.0–48.0)
HEMOGLOBIN: 11 g/dL — ABNORMAL LOW (ref 12.9–16.5)
MEAN CORPUSCULAR HEMOGLOBIN CONC: 34.1 g/dL (ref 32.0–36.0)
MEAN CORPUSCULAR HEMOGLOBIN: 33.5 pg — ABNORMAL HIGH (ref 25.9–32.4)
MEAN CORPUSCULAR VOLUME: 98.4 fL — ABNORMAL HIGH (ref 77.6–95.7)
MEAN PLATELET VOLUME: 11.1 fL — ABNORMAL HIGH (ref 6.8–10.7)
PLATELET COUNT: 46 10*9/L — ABNORMAL LOW (ref 150–450)
RED BLOOD CELL COUNT: 3.29 10*12/L — ABNORMAL LOW (ref 4.26–5.60)
RED CELL DISTRIBUTION WIDTH: 15.9 % — ABNORMAL HIGH (ref 12.2–15.2)
WBC ADJUSTED: 4.3 10*9/L (ref 3.6–11.2)

## 2021-11-26 LAB — COMPREHENSIVE METABOLIC PANEL
ALBUMIN: 2.5 g/dL — ABNORMAL LOW (ref 3.4–5.0)
ALKALINE PHOSPHATASE: 147 U/L — ABNORMAL HIGH (ref 46–116)
ALT (SGPT): 21 U/L (ref 10–49)
ANION GAP: 5 mmol/L (ref 5–14)
AST (SGOT): 46 U/L — ABNORMAL HIGH (ref <=34)
BILIRUBIN TOTAL: 4.4 mg/dL — ABNORMAL HIGH (ref 0.3–1.2)
BLOOD UREA NITROGEN: 9 mg/dL (ref 9–23)
BUN / CREAT RATIO: 7
CALCIUM: 9 mg/dL (ref 8.7–10.4)
CHLORIDE: 107 mmol/L (ref 98–107)
CO2: 27.2 mmol/L (ref 20.0–31.0)
CREATININE: 1.25 mg/dL — ABNORMAL HIGH
EGFR CKD-EPI (2021) MALE: 66 mL/min/{1.73_m2} (ref >=60)
GLUCOSE RANDOM: 125 mg/dL (ref 70–179)
POTASSIUM: 4.1 mmol/L (ref 3.4–4.8)
PROTEIN TOTAL: 6.4 g/dL (ref 5.7–8.2)
SODIUM: 139 mmol/L (ref 135–145)

## 2021-11-26 LAB — PROTIME-INR
INR: 2.09
PROTIME: 24.4 s — ABNORMAL HIGH (ref 9.8–12.8)

## 2021-11-26 MED ORDER — ZINC SULFATE 25 MG ZINC (110 MG) TABLET
ORAL_TABLET | Freq: Every day | ORAL | 11 refills | 30 days | Status: CP
Start: 2021-11-26 — End: ?

## 2021-11-26 NOTE — Unmapped (Addendum)
Next steps:  - We will be in touch about the results of your lab tests  - Please return your stool studies to the lab  - We started zinc supplementation which can help with encephalopathy related to your liver disease  - Would recommend decreasing lactulose to twice a day if you are having dehydration and diarrhea, you can go back up to three times a day if your bowel movements go back to normal  - We have order an upper endoscopy. Please call [phone] option 1 then option 2 to schedule  - I encourage you to continue to eat high protein foods and supplements  - We will try to increase your physical therapy to 2-3 times a week      Thank you for seeing [place] in clinic! If you do not have MyChart, please sign up at [email or web address] MyChart. MyChart will allow you to view the notes including next steps and test results. If you have questions before our next visit, please send me an electronic message though MyChart or call in at the number below. Please note electronic messaging is only for non-urgent questions and response times can vary (3-5 business days).    Carleene Cooper, MD  [facility] Gastroenterology and Hepatology Fellow    Important phone numbers:  GI Clinic Appointments:  [phone] option 1 then option 1  Please call the GI Clinic appointment line if you need to schedule, reschedule or cancel an appointment in clinic. They can also answer any questions you may have about where your appointment is located and when you need to arrive.      GI Procedure Appointments:  [phone] option 1 then option 2  Please call the GI Procedures line if you need to schedule, reschedule or cancel any type of GI procedure (endoscopy, colonoscopy, motility testing, etc).  You can also call this number for prep instructions, etc.      Radiology: [phone]  If you are being scheduled for any type of radiology study, you will need to call to receive your appointment time.  Please call this number for information. For emergencies after normal business hours or on weekends/holidays   Proceed to the nearest emergency room OR contact the [facility] Operator at [phone] who can page the Gastroenterology Fellow on call.

## 2021-11-26 NOTE — Unmapped (Signed)
Call placed to pt to check in. Pt is present with his wife, on his way to clinic appt with [name]. Pt states he feels like crap. Reports headache and diarrhea for several days. Feels weak and per wife needing to utilize walker. Pt is frustrated with ongoing symptoms and is having difficulty maintaining consistency with BMs (either constipated or nonstop). Provided support and will follow up with recommendations made from appt today if needed.

## 2021-11-26 NOTE — Unmapped (Signed)
Updated [employer] MELD score in [place] today with the following labs:  Recertification of  MELD is 19 via UNOS    Mr. Delfavero was also noted to have the following Mild Encephalopathy and Moderate Ascites and this was noted in his MELD upgrade today.  Lab Results   Component Value Date    CREATININE 1.32 (H) 11/21/2021    NA 135 11/21/2021    BILITOT 2.9 (H) 11/21/2021    ALB 2.6 (L) 11/21/2021    INR 1.6 (H) 11/21/2021

## 2021-11-26 NOTE — Unmapped (Signed)
[facility] NOTE    REFERRING PROVIDER: Card, [name], MD  PRIMARY CARE PROVIDER: Loman Brooklyn, MD    DATE OF SERVICE: 11/26/2021    Subjective   SUBJECTIVE:     CHIEF COMPLAINT: follow-up for routine cirrhosis care    HISTORY OF PRESENT ILLNESS:      Matthew Key is a 61 y.o. 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 His cirrhosis has been complicated by ascites, hepatic encephalopathy and non-bleeding esophageal varices. Patient???s prior issues have included hyponatremia, adrenal insufficiency, SBP and volume overload. He is now actively listed for LT.     INTERVAL HISTORY: (since the time of the patient's last visit): Last seen 10/01/2021     Admitted to hospital 10/12/2021 with HE, quickly improved with lactulose. Continued on lactulose and rifaximin. Of note, CT with large splenorenal shunt. Restarted on carvedilol during that hospitalization (had initially be stopped due to hyponatremia) but that had to be held after development of AKI. Discharged on 10/25/2021 with coreg 3.125 mg BID, Lasix 40 mg daily, spironolactone 50 mg daily, cipro ppx, lactulose, rifaximin.    Called on 11/20/2021 after developing constipation and slowed speech concerning for HE after being prescribed duloxetine for back pain. Stopped this medication after developing those symptoms.     Since his discharge from the hospital, he reports feeling dizziness and overall weakness. He reports having uncontrolled diarrhea with urgency. Has to carry extra pants. Denies abdominal pain or cramping. Taking 3x daily lactulose despite diarrhea since he has had such severe encephalopathy. Also with headache which he attributes to dehydration. Denies URI symptoms, fevers, or chills.      Scheduled for liver US 01/03/2022 and MRI 02/2022 (pancreatic cyst).     Labs 11/21/2021: Na 135, Cr 1.32, albumin 2.6, Tbili 2.9, AST and ALT wnl, INR 1.6    Wt Readings from Last 6 Encounters:   11/26/21 92.1 kg (203 lb)   10/23/21 100.7 kg (222 lb 0.1 oz)   10/08/21 90 kg (198 lb 8 oz)   10/01/21 92.4 kg (203 lb 12.8 oz)   09/10/21 94.3 kg (208 lb)   08/26/21 99.8 kg (220 lb)     REVIEW OF SYSTEMS:   The balance of 12 systems reviewed is negative except as noted in the HPI.     PAST MEDICAL HISTORY:  Past Medical History:   Diagnosis Date    AKI (acute kidney injury) (CMS-HCC) 12/14/2020    Anxiety 10/22/2013    Arthritis     Cervical radiculopathy 12/03/2016    Chronic pain disorder     Lower back    Cirrhosis (CMS-HCC)     Dental abscess 10/2020    Duodenal ulcer 12/01/2017    GERD (gastroesophageal reflux disease)     History of transfusion     Hyperlipidemia 10/22/2013    Hypertension     under control with meds and weight loss    Liver disease     Sleep apnea, obstructive     Have machine    Stroke (CMS-HCC)     mild stroke    Type 2 diabetes mellitus with diabetic neuropathy, with long-term current use of insulin (CMS-HCC) 06/09/2014       MEDICATIONS:   Current Outpatient Medications   Medication Sig Dispense Refill    acetaminophen (TYLENOL) 325 MG tablet  Take 2 tablets (650 mg total) by mouth every six (6) hours as needed.  0    atorvastatin (LIPITOR) 10 MG tablet Take 1 tablet (10 mg total) by mouth nightly.      ciprofloxacin HCl (CIPRO) 500 MG tablet TAKE 1 TABLET BY MOUTH IN THE MORNING 30 tablet 0    furosemide (LASIX) 20 MG tablet Take 2 tablets (40 mg total) by mouth daily. 60 tablet 0    gabapentin (NEURONTIN) 400 MG capsule Take 1 capsule (400 mg total) by mouth two (2) times a day. 60 capsule 0    hydrOXYzine (VISTARIL) 50 MG capsule       insulin syringe-needle U-100 1 mL 31 gauge x 5/16 (8 mm) Syrg Use once as needed for Hydrocortisone injection.. 10 each 0    lisinopriL (PRINIVIL,ZESTRIL) 2.5 MG tablet Take 1 tablet (2.5 mg total) by mouth daily.      ondansetron (ZOFRAN-ODT) 4 MG disintegrating tablet TAKE 1 TABLET BY MOUTH EVERY 6 HOURS FOR 7 DAYS      ondansetron (ZOFRAN-ODT) 4 MG disintegrating tablet Take 1 tablet (4 mg total) by mouth every eight (8) hours as needed for nausea. 72 tablet 11    pantoprazole (PROTONIX) 40 MG tablet Take 1 tablet (40 mg total) by mouth daily. 30 tablet 0    pen needle, diabetic (BD ULTRA-FINE NANO PEN NEEDLE) 32 gauge x 5/32 (4 mm) Ndle ok to sub any brand or size needle preferred by insurance/patient, use 1-2x/day, dx E11.65 100 each 12    rifAXIMin (XIFAXAN) 550 mg Tab Take 1 tablet (550 mg total) by mouth Two (2) times a day. 60 tablet 5    semaglutide (OZEMPIC) 1 mg/dose (4 mg/3 mL) PnIj injection Inject 1 mg under the skin every seven (7) days. 3 mL 3    sodium chloride (AYR) 0.65 % Drop 1 spray.      spironolactone (ALDACTONE) 25 MG tablet Take 2 tablets (50 mg total) by mouth daily. 60 tablet 0    syringe with needle 3 mL 23 x 1 Syrg Use as needed for Hydrocortisone injection 1 each 0    TRESIBA FLEXTOUCH U-100 100 unit/mL (3 mL) InPn Inject 0.3 mL (30 Units total) under the skin at bedtime. Adjust as instructed. 15 mL 0    carvediloL (COREG) 3.125 MG tablet Take 1 tablet (3.125 mg total) by mouth Two (2) times a day. 60 tablet 0    zinc sulfate 110 mg (25 mg elemental zinc) Tab Take 1 tablet (110 mg total) by mouth in the morning. 30 tablet 11     No current facility-administered medications for this visit.       ALLERGIES:   Venom-honey bee       Objective   OBJECTIVE:   VITAL SIGNS: BP 105/39 (BP Site: L Arm, BP Position: Sitting, BP Cuff Size: Medium)  - Pulse 60  - Temp 36.7 ??C (98.1 ??F) (Tympanic)  - Ht 172.7 cm (5' 8)  - Wt 92.1 kg (203 lb)  - SpO2 100%  - BMI 30.87 kg/m??      Wt Readings from Last 3 Encounters:   11/26/21 92.1 kg (203 lb)   10/23/21 100.7 kg (222 lb 0.1 oz)   10/08/21 90 kg (198 lb 8 oz)       PHYSICAL EXAM:  Gen: Chronically ill-appearing male in NAD, answers questions appropriately  Eyes: Sclera anicteric  Abdomen: Normoactive bowel sounds, soft, NTND, no rebound/guarding.  Extremities: 1+ B/L LE edema.  Neuro:  Normal speech. No asterixis.   Skin: No rashes, lesions on clothed exam  Psych: Alert, normal mood and affect.     DIAGNOSTIC STUDIES:  I have reviewed all pertinent diagnostic studies, including:  Laboratory results:  Lab Results   Component Value Date    WBC 3.7 11/21/2021    HGB 9.7 (L) 11/21/2021    MCV 95 11/21/2021    PLT 58 (LL) 11/21/2021    Lab Results   Component Value Date    NA 135 11/21/2021    K 3.9 11/21/2021    CL 105 11/21/2021    CREATININE 1.32 (H) 11/21/2021    GLU 96 10/25/2021    CALCIUM 8.6 11/21/2021      Lab Results   Component Value Date    ALBUMIN 2.0 (L) 10/23/2021    AST 36 11/21/2021    ALT 16 11/21/2021    ALKPHOS 144 (H) 11/21/2021    BILITOT 2.9 (H) 11/21/2021    Lab Results   Component Value Date    INR 1.6 (H) 11/21/2021        Lab Results   Component Value Date    AFPTM <2 10/01/2021    AFPTM 3 05/09/2021    AFPTM 1.9 08/16/2020    AFPTM 3 01/24/2020       Lab Results   Component Value Date    IRON 98 05/09/2021    FERRITIN 558.5 (H) 05/09/2021     Lab Results   Component Value Date    CHOL 149 12/13/2019    TRIG 95 12/13/2019    A1C 5.8 (H) 08/25/2021     Lab Results   Component Value Date    VITDTOTAL 27.9 09/11/2020     Lab Results   Component Value Date    HEPAIGG Reactive (A) 05/09/2021    HBSAG Nonreactive 06/05/2021    HEPBSAB Nonreactive 06/05/2021    HEPBCAB Nonreactive 06/05/2021    HEPCAB Nonreactive 06/05/2021    HIV Nonreactive 06/05/2021     Lab Results   Component Value Date    ANA Negative 05/09/2021    SMOOTHMUSCAB Positive (A) 05/09/2021    MITOAB Negative 05/09/2021       MELD-Na: 19 at 11/21/2021  3:11 PM  Calculated from:  Serum Creatinine: 1.32 mg/dL at 1/61/0960  4:54 PM  Serum Sodium: 135 mmol/L at 11/21/2021  3:11 PM  Total Bilirubin: 2.9 mg/dL at 0/98/1191  4:78 PM  INR(ratio): 1.6 at 11/21/2021  3:11 PM    Radiographic studies:  Reviewed    GI Procedures:  Upper endoscopy 10/12/20:    - Scar in the lower third of the esophagus from prior                          esophageal rupture.                         - Multiple white plaques in the lower third of the                          esophagus in area of scar. Brushings performed.                         - Grade I esophageal varices. No high risk varices in  distal 5 cm of esophagus requiring banding.                         - Portal hypertensive gastropathy.                         - Gastric lipoma.                         - Gastric antral vascular ectasia.                         - Non-bleeding duodenal ulcers with no stigmata of                          bleeding and duodentitis. Biopsied.       ASSESSMENT AND PLAN:   Mr. Goerner is a 62 y.o. 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 His cirrhosis has been complicated by ascites, hepatic encephalopathy and non-bleeding esophageal varices. Patient???s prior issues have included hyponatremia, adrenal insufficiency, SBP and volume overload. He is now actively listed for LT.    Since his last discharge from the hospital, he is overall physically deconditioned and BP has been low. We will schedule expedited EGD to reeval varices prior to stopping coreg. We will try to increase his physical therapy to 2-3x weekly. We will also add zinc supplementation given his brittle encephalopathy. We will test stool today given diarrhea with urgency and recent hospitalization, including GIPP and C Diff. We did advise that he could adjust his lactulose if he is having severe diarrhea so that he does not become dehydrated.    We also discussed living liver transplantation today though unclear if based on his size that this is an option. We will discuss with Dr. Celine Mans.    Plan:  -Given the amount of weakness/dizziness he is experiencing, we will get EGD to eval varices. If resolved, will stop coreg  -Continue Lasix 40 mg daily, spironolactone 50 mg daily (dose uptitration limited by kidney function) with prn LVPs  -Lactulose to 3-4 times per day, this can be decreased if patient is experiencing diarrhea  -Continue rifaximin 550 mg BID  -Added zinc supplementation daily  -If patient continues to develop breakthrough HE, will consider embolization of splenorenal shunt (although this would increase risk of vol overload which has been an issue in past)  -Will send stool studies today given symptoms  -Continue low sodium diet and weekly lab checks  -Continue cipro 500mg  qd indefinitely for SBP ppx  -Avoid additional QTc prolonging medications given prolonged QTc at baseline  -Continue ongoing care for DM  -MRI due in 02/2022 (ordered)  -Advised patient to cancel [place] given that he???s receiving MRI in 02/2022 (will serve as HCC surveillance)   -F/u in 1 months    Health Care Maintenance:  - EGD: UTD, last 10/12/20 with grade I EV  - HCC surveillance: UTD, due 02/2022  - SBP prophylaxis [criteria: h/o SBP, or CP ? 9 w/ bili ? 3, renal insufficiency, or Na ? 130]: on cipro  - Nutrition: wt & exercise mgmnt, high protein, low Na intake and bedtime snack discussed.  - OTC agents: proper use of acetaminophen and  avoidance of NSAIDs & herbal/dietary supplements discussed.  - Counseled on need to avoid shellfish    Vaccinations: We recommend that patients have vaccinations to prevent various infections that can occur, especially in the setting of having underlying liver disease. The following vaccinations should be given:  -Hepatitis A: immune  -Hepatitis B: completed series 2019  -Influenza (yearly)  -Pneumococcal: Prevnar 2019  -Covid: 3/3 done  -Zoster: Shingrix (age > 50). 2/2 done    I spent a majority of my time today with the patient reviewing historical data, old records (including faxed data and data in Care Everywhere), performing a physical examination, and formulating an assessment and plan together with the patient.    Tawni Carnes, MD, MPH  Assistant Professor of Medicine  Division of Gastroenterology and Hepatology

## 2021-11-27 NOTE — Unmapped (Signed)
EGD  Procedure #1      0  Procedure #2      [id number]  MRN      0  Endoscopist      FALSE  Is the patient's health insurance [address], [facility]), or [employer]?      FALSE  Urgent procedure      FALSE  Do you take: Plavix (clopidogrel), Coumadin (warfarin), Lovenox (enoxaparin), Pradaxa (dabigatran), Effient (prasugrel), Xarelto (rivaroxaban), Eliquis (apixaban), Pletal (cilostazol), or Brilinta (ticagrelor)?    FALSE  Do you have hemophilia, von Willebrand disease, thrombocytopenia?      FALSE  Do you have a pacemaker or implanted cardiac defibrillator?      FALSE  Are you pregnant?      FALSE  Has a [facility] provider specified the location(s)?        Which location(s) did the [facility] provider specify?      FALSE     [facility]      FALSE     [place]      FALSE     [facility]-Propofol      FALSE     HMOB-Mod Sedation      TRUE  Is procedure indication for variceal banding (this does NOT include variceal screening)?              5  Height (feet)      8  Height (inches)      203  Weight (pounds)      30.9  BMI              FALSE  Did the ordering provider specify a bowel prep?      0       What bowel prep was specified?      FALSE  Do you have chronic kidney disease?      FALSE  Do you have chronic constipation or have you had poor quality bowel preps for past colonoscopies?      FALSE  Do you have Crohn's disease or ulcerative colitis?      FALSE  Have you had weight loss surgery?              FALSE  Are you in the process of scheduling or awaiting results of a heart ultrasound, stress test, or catheterization to evaluate new or worsening chest pain, dizziness, or shortness of breath?     FALSE  When you walk around your house or grocery store, do you have to stop and rest due to shortness of breath, chest pain, or light-headedness?      FALSE  Have you had a heart attack, stroke or heart stent placement within the past 6 months?      FALSE  Do you ever use supplemental oxygen? FALSE  Have you been hospitalized for cirrhosis of the liver or heart failure in the last 12 months?      FALSE  Have you been treated for mouth or throat cancer with radiation or surgery?      FALSE  Have you been told that it is difficult for doctors to insert a breathing tube in you during anesthesia?      FALSE  Have you had a heart or lung transplant?              FALSE  Are you on dialysis?      FALSE  Do you have cirrhosis of the liver?      FALSE  Do you have myasthenia gravis?      FALSE  Is the patient a prisoner?              FALSE  Have you been diagnosed with sleep apnea or do you wear a CPAP machine at night?      FALSE  Are you younger than 30?      FALSE  Have you previously received propofol sedation administered by an anesthesiologist for a GI procedure?      FALSE  Do you drink an average of more than 3 drinks of alcohol per day?      FALSE  Do you regularly take suboxone or any prescription medications for chronic pain?      FALSE  Do you regularly take Ativan, Klonopin, Xanax, Valium, lorazepam, clonazepam, alprazolam, or diazepam?      FALSE  Have you previously had difficulty with sedation during a GI procedure?      FALSE  Have you been diagnosed with PTSD?      FALSE  Are you allergic to fentanyl or midazolam (Versed)?      FALSE  Do you take medications for HIV?      ################### ################################################################################################################### ################ ############### ###############   MRN:          [id number]      Anticoag Review:  No      Nurse Triage:  No      GI Clinic Consult:  No      Procedure(s):  EGD 0     [facility]      Endoscopist:  0      Urgent:            No       Prep:                             ################### ################################################################################################################### ################ ############### ###############

## 2021-11-27 NOTE — Unmapped (Signed)
Updated [place] [facility] [employer] MELD score in [place] today with the following labs:  Recertification of  MELD is 23 via UNOS    [name] was also noted to have the following Mild Encephalopathy and Moderate Ascites and this was noted in his MELD upgrade today.  Lab Results   Component Value Date    CREATININE 1.25 (H) 11/26/2021    NA 139 11/26/2021    BILITOT 4.4 (H) 11/26/2021    ALBUMIN 2.5 (L) 11/26/2021    INR 2.09 11/26/2021

## 2021-11-29 MED ORDER — PEG 3350-ELECTROLYTES 236 GRAM-22.74 GRAM-6.74 GRAM-5.86 GRAM SOLUTION
Freq: Once | ORAL | 0 refills | 1 days | Status: CP
Start: 2021-11-29 — End: 2021-11-29

## 2021-12-03 DIAGNOSIS — K7210 Chronic hepatic failure without coma: Principal | ICD-10-CM

## 2021-12-06 LAB — PROTIME-INR
INR: 1.7 — ABNORMAL HIGH (ref 0.9–1.2)
PROTHROMBIN TIME: 17.2 s — ABNORMAL HIGH (ref 9.1–12.0)

## 2021-12-06 LAB — COMPREHENSIVE METABOLIC PANEL
A/G RATIO: 0.8 — ABNORMAL LOW (ref 1.2–2.2)
ALBUMIN: 2.7 g/dL — ABNORMAL LOW (ref 3.8–4.8)
ALKALINE PHOSPHATASE: 186 IU/L — ABNORMAL HIGH (ref 44–121)
ALT (SGPT): 21 IU/L (ref 0–44)
AST (SGOT): 39 IU/L (ref 0–40)
BILIRUBIN TOTAL (MG/DL) IN SER/PLAS: 3.6 mg/dL — ABNORMAL HIGH (ref 0.0–1.2)
BLOOD UREA NITROGEN: 9 mg/dL (ref 8–27)
BUN / CREAT RATIO: 8 — ABNORMAL LOW (ref 10–24)
CALCIUM: 8.7 mg/dL (ref 8.6–10.2)
CHLORIDE: 105 mmol/L (ref 96–106)
CO2: 24 mmol/L (ref 20–29)
CREATININE: 1.14 mg/dL (ref 0.76–1.27)
GLOBULIN, TOTAL: 3.5 g/dL (ref 1.5–4.5)
GLUCOSE: 155 mg/dL — ABNORMAL HIGH (ref 70–99)
POTASSIUM: 4.5 mmol/L (ref 3.5–5.2)
SODIUM: 138 mmol/L (ref 134–144)
TOTAL PROTEIN: 6.2 g/dL (ref 6.0–8.5)

## 2021-12-07 ENCOUNTER — Encounter: Admit: 2021-12-07 | Discharge: 2021-12-07 | Payer: MEDICARE

## 2021-12-07 ENCOUNTER — Ambulatory Visit: Admit: 2021-12-07 | Discharge: 2021-12-07 | Payer: MEDICARE

## 2021-12-07 MED ADMIN — propofoL (DIPRIVAN) injection: INTRAVENOUS | @ 20:00:00 | Stop: 2021-12-07

## 2021-12-07 MED ADMIN — lactated Ringers infusion: INTRAVENOUS | @ 20:00:00 | Stop: 2021-12-07

## 2021-12-07 MED ADMIN — propofol (DIPRIVAN) infusion 10 mg/mL: INTRAVENOUS | @ 20:00:00 | Stop: 2021-12-07

## 2021-12-07 MED ADMIN — lidocaine (XYLOCAINE) 20 mg/mL (2 %) injection: INTRAVENOUS | @ 20:00:00 | Stop: 2021-12-07

## 2021-12-07 NOTE — Unmapped (Signed)
Patient is scheduled for upper and lower procedures today with an arrival time of 2:30 pm. He is concerned with the bowel output not being clear enough and is asking if he should take an enema? He has a little more of the [place] to complete. He has an hour to drive from home to the GI procedure site and would like more instructions before leaving home to come to this appointment.  I transferred the call to Aundra Millet, the Charge Nurse at the site to discuss further plan of care for today.

## 2021-12-10 DIAGNOSIS — K7210 Chronic hepatic failure without coma: Principal | ICD-10-CM

## 2021-12-11 ENCOUNTER — Encounter
Admit: 2021-12-11 | Discharge: 2021-12-13 | Payer: MEDICARE | Attending: Student in an Organized Health Care Education/Training Program | Primary: Student in an Organized Health Care Education/Training Program

## 2021-12-11 ENCOUNTER — Ambulatory Visit: Admit: 2021-12-11 | Discharge: 2021-12-13 | Payer: MEDICARE

## 2021-12-11 LAB — CBC W/ AUTO DIFF
BASOPHILS ABSOLUTE COUNT: 0 10*9/L (ref 0.0–0.1)
BASOPHILS RELATIVE PERCENT: 0.6 %
EOSINOPHILS ABSOLUTE COUNT: 0.3 10*9/L (ref 0.0–0.5)
EOSINOPHILS RELATIVE PERCENT: 4.9 %
HEMATOCRIT: 32.6 % — ABNORMAL LOW (ref 39.0–48.0)
HEMOGLOBIN: 11.4 g/dL — ABNORMAL LOW (ref 12.9–16.5)
LYMPHOCYTES ABSOLUTE COUNT: 1.6 10*9/L (ref 1.1–3.6)
LYMPHOCYTES RELATIVE PERCENT: 27.9 %
MEAN CORPUSCULAR HEMOGLOBIN CONC: 34.9 g/dL (ref 32.0–36.0)
MEAN CORPUSCULAR HEMOGLOBIN: 34 pg — ABNORMAL HIGH (ref 25.9–32.4)
MEAN CORPUSCULAR VOLUME: 97.4 fL — ABNORMAL HIGH (ref 77.6–95.7)
MEAN PLATELET VOLUME: 10.8 fL — ABNORMAL HIGH (ref 6.8–10.7)
MONOCYTES ABSOLUTE COUNT: 1 10*9/L — ABNORMAL HIGH (ref 0.3–0.8)
MONOCYTES RELATIVE PERCENT: 18.1 %
NEUTROPHILS ABSOLUTE COUNT: 2.7 10*9/L (ref 1.8–7.8)
NEUTROPHILS RELATIVE PERCENT: 48.5 %
PLATELET COUNT: 70 10*9/L — ABNORMAL LOW (ref 150–450)
RED BLOOD CELL COUNT: 3.34 10*12/L — ABNORMAL LOW (ref 4.26–5.60)
RED CELL DISTRIBUTION WIDTH: 16 % — ABNORMAL HIGH (ref 12.2–15.2)
WBC ADJUSTED: 5.6 10*9/L (ref 3.6–11.2)

## 2021-12-11 LAB — COMPREHENSIVE METABOLIC PANEL
ALBUMIN: 2.5 g/dL — ABNORMAL LOW (ref 3.4–5.0)
ALKALINE PHOSPHATASE: 176 U/L — ABNORMAL HIGH (ref 46–116)
ALT (SGPT): 22 U/L (ref 10–49)
ANION GAP: 3 mmol/L — ABNORMAL LOW (ref 5–14)
AST (SGOT): 42 U/L — ABNORMAL HIGH (ref <=34)
BILIRUBIN TOTAL: 5.3 mg/dL — ABNORMAL HIGH (ref 0.3–1.2)
BLOOD UREA NITROGEN: 7 mg/dL — ABNORMAL LOW (ref 9–23)
BUN / CREAT RATIO: 7
CALCIUM: 9.2 mg/dL (ref 8.7–10.4)
CHLORIDE: 104 mmol/L (ref 98–107)
CO2: 28 mmol/L (ref 20.0–31.0)
CREATININE: 1.01 mg/dL
EGFR CKD-EPI (2021) MALE: 84 mL/min/{1.73_m2} (ref >=60)
GLUCOSE RANDOM: 112 mg/dL (ref 70–179)
POTASSIUM: 3.9 mmol/L (ref 3.4–4.8)
PROTEIN TOTAL: 7 g/dL (ref 5.7–8.2)
SODIUM: 135 mmol/L (ref 135–145)

## 2021-12-11 LAB — LACTATE DEHYDROGENASE: LACTATE DEHYDROGENASE: 229 U/L (ref 120–246)

## 2021-12-11 LAB — URINALYSIS WITH MICROSCOPY
BACTERIA: NONE SEEN /HPF
BILIRUBIN UA: NEGATIVE
BLOOD UA: NEGATIVE
KETONES UA: NEGATIVE
LEUKOCYTE ESTERASE UA: NEGATIVE
NITRITE UA: NEGATIVE
PH UA: 6.5 (ref 5.0–9.0)
PROTEIN UA: NEGATIVE
RBC UA: 1 /HPF (ref <=3)
SPECIFIC GRAVITY UA: 1.014 (ref 1.003–1.030)
SQUAMOUS EPITHELIAL: <1 /HPF (ref 0–5)
UROBILINOGEN UA: <2
WBC UA: 1 /HPF (ref <=2)

## 2021-12-11 LAB — PROTIME-INR
INR: 2.03
PROTIME: 23.6 s — ABNORMAL HIGH (ref 9.8–12.8)

## 2021-12-11 LAB — FIBRINOGEN: FIBRINOGEN LEVEL: 145 mg/dL — ABNORMAL LOW (ref 175–500)

## 2021-12-11 LAB — AMYLASE: AMYLASE: 76 U/L (ref 30–118)

## 2021-12-11 LAB — ANTITHROMBIN III: ANTITHROMB III, FUNC: 24 % — CL (ref 80–130)

## 2021-12-11 LAB — MAGNESIUM: MAGNESIUM: 1.5 mg/dL — ABNORMAL LOW (ref 1.6–2.6)

## 2021-12-11 LAB — HEMOGLOBIN A1C
ESTIMATED AVERAGE GLUCOSE: 85 mg/dL
HEMOGLOBIN A1C: 4.6 % — ABNORMAL LOW (ref 4.8–5.6)

## 2021-12-11 LAB — URIC ACID: URIC ACID: 3.5 mg/dL — ABNORMAL LOW

## 2021-12-11 LAB — APTT
APTT: 38 s — ABNORMAL HIGH (ref 25.1–36.5)
HEPARIN CORRELATION: 0.2

## 2021-12-11 LAB — PHOSPHORUS: PHOSPHORUS: 3.4 mg/dL (ref 2.4–5.1)

## 2021-12-11 LAB — FACTOR II ACTIVITY: FACTOR II ACTIVITY: 28 % — ABNORMAL LOW (ref 77–131)

## 2021-12-11 LAB — FACTOR 7 ACTIVITY: FACTOR VII ACTIVITY: 18 % — ABNORMAL LOW (ref 61–146)

## 2021-12-11 LAB — GAMMA GT: GAMMA GLUTAMYL TRANSFERASE: 26 U/L

## 2021-12-11 NOTE — Unmapped (Signed)
[facility]  Emergency Department Provider Note    ED Clinical Impression     Final diagnoses:   Liver transplant candidate (Primary)     HPI, ED Course, Assessment and Plan     Initial Clinical Impression:    Dec 11, 2021 3:27 PM   Matthew Key is a 61 y.o. 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  Patient was supposed to be a direct admit, however presented to the ED out of confusion.  Per chart review, patient has an H&P written, was sent to the hospital to have labs drawn for potential liver transplant.  Patient denies any complaints, no chest pain or shortness of breath, no fever/chills.  No abdominal pain, no nausea/vomiting or diarrhea.    BP 115/54  - Pulse 69  - Temp 36.5 ??C (97.7 ??F) (Oral)  - Resp 18  - SpO2 99%     On exam, patient well-appearing, no acute distress.  Vital signs stable, afebrile.  Lungs clear to auscultation bilaterally.  Abdomen soft, nondistended, nontender to palpation.    Medical Decision Making    In summary, this is a 61 year old male with history as above who presents for evaluation of liver transplant.  Per chart review, patient planned to be a direct admit, H&P has already been written by the surgery transplant team.  We will reach out to surgery regarding any recommendations for labs as patient is in the ED currently.  Otherwise well-appearing, no symptoms, vital signs are stable and physical exam reassuring.    Further ED updates and updates to plan as per ED Course below:    ED Course as of 12/11/21 1725   Tue Dec 11, 2021   1542 Spoke with transplant surgery, patient was meant to be a direct admit for liver transplant, they have placed admit orders for him and lab work-up.  Will transfer patient to the floor. No indication for work-up in ED as patient has bed ready on the floor.     Independent Interpretation of Studies: I have independently interpreted the following studies:  No imaging obtained as not clinically indicated at this time    Discussion of Management With Other Providers or Support Staff: I discussed the management of this patient with the:  Transplant surgery team who will admit patient for evaluation for liver transplant    Considerations Regarding Disposition/Escalation of Care and Critical Care:  Patient to be admitted for liver transplant consideration with transplant surgery team    Social Determinants of Health with Concerns     Internet Connectivity: Not on file   Alcohol Use: Not on file   Substance Use: Not on file   Health Literacy: Not on file   Physical Activity: Not on file   Interpersonal Safety: Not on file   Stress: Not on file   Intimate Partner Violence: Not on file   Social Connections: Not on file     _____________________________________________________________________    The case was discussed with the attending physician who is in agreement with the above assessment and plan    Past History     PAST MEDICAL HISTORY/PAST SURGICAL HISTORY:   Past Medical History:   Diagnosis Date    AKI (acute kidney injury) (CMS-HCC) 12/14/2020    Anxiety 10/22/2013    Arthritis     Cervical radiculopathy 12/03/2016  Chronic pain disorder     Lower back    Cirrhosis (CMS-HCC)     Dental abscess 10/2020    Duodenal ulcer 12/01/2017    GERD (gastroesophageal reflux disease)     History of transfusion     Hyperlipidemia 10/22/2013    Hypertension     under control with meds and weight loss    Liver disease     Sleep apnea, obstructive     Have machine    Stroke (CMS-HCC)     mild stroke    Type 2 diabetes mellitus with diabetic neuropathy, with long-term current use of insulin (CMS-HCC) 06/09/2014       Past Surgical History:   Procedure Laterality Date    KNEE SURGERY      PR CATH PLACE/CORON ANGIO, IMG SUPER/INTERP,R&L HRT CATH, L HRT VENTRIC N/A 12/19/2020    Procedure: CATH LEFT/RIGHT HEART CATHETERIZATION;  Surgeon: Rosana Hoes, MD;  [facility]: [facility] CATH;  Service: Cardiology    PR COLONOSCOPY FLX DX W/COLLJ SPEC WHEN PFRMD N/A 12/07/2021    Procedure: COLONOSCOPY, FLEXIBLE, PROXIMAL TO SPLENIC FLEXURE; DIAGNOSTIC, W/WO COLLECTION SPECIMEN BY BRUSH OR WASH;  Surgeon: Annie Paras, MD;  [facility]: [facility];  Service: Gastroenterology    PR UPPER GI ENDOSCOPY,BIOPSY N/A 10/12/2020    Procedure: UGI ENDOSCOPY; WITH BIOPSY, SINGLE OR MULTIPLE;  Surgeon: Marene Lenz, MD;  [facility]: [facility];  Service: Gastroenterology    PR UPPER GI ENDOSCOPY,LIGAT VARIX N/A 12/07/2021    Procedure: UGI ENDO; Everlene Balls LIG ESOPH &/OR GASTRIC VARICES;  Surgeon: Annie Paras, MD;  Location: [facility];  Service: Gastroenterology    ROOT CANAL      Front teeth       MEDICATIONS:     Current Facility-Administered Medications:     lactated Ringers infusion, 10 mL/hr, Intravenous, Continuous, Leretha Dykes, MD    Facility-Administered Medications Ordered in Other Encounters:     methylPREDNISolone sodium succinate (PF) (Solu-MEDROL) 500 mg in sodium chloride (NS) 0.9 % 50 mL IVPB, 500 mg, Intravenous, For OR use, Leretha Dykes, MD    piperacillin-tazobactam (ZOSYN) 3.375 g in sodium chloride 0.9 % (NS) 100 mL IVPB-connector bag, 3.375 g, Intravenous, Once, Leretha Dykes, MD    ALLERGIES:   Venom-honey bee    SOCIAL HISTORY:   Social History     Tobacco Use    Smoking status: Never    Smokeless tobacco: Never   Substance Use Topics    Alcohol use: Not Currently       FAMILY HISTORY:  Family History   Problem Relation Age of Onset    Edema Mother     Alzheimer's disease Father     Aortic dissection Brother     Early death Brother     Aneurysm Brother           Review of Systems     A review of systems was performed and relevant portions were as noted above in HPI     Physical Exam     VITAL SIGNS:    BP 115/54  - Pulse 69  - Temp 36.5 ??C (97.7 ??F) (Oral)  - Resp 18  - SpO2 99%     Constitutional:   Alert and oriented.   Head:   Normocephalic and atraumatic  Eyes:   Conjunctivae are normal, EOMI, PERRL  ENT:   No notable congestion, Mucous membranes moist, External ears normal, no notable stridor  Cardiovascular:   Rate as vitals above. Appears warm and well perfused  Respiratory:   Normal respiratory effort. Breath sounds are normal.  Gastrointestinal:   Soft, non-distended, and nontender.   Genitourinary:   Deferred  Musculoskeletal:    Normal range of motion in all extremities. No tenderness or edema noted in B/L lower extremities  Neurologic:   No gross focal neurologic deficits beyond baseline are appreciated.  Skin:   Skin is warm, dry and intact.       Radiology     XR Chest 2 views   Final Result      --Mild blunting of the right costophrenic angle which may reflect small pleural effusion. No acute airspace disease.          Labs     Labs Reviewed   COVID-19 PCR   COMPREHENSIVE METABOLIC PANEL   MAGNESIUM   PHOSPHORUS   URIC ACID   GAMMA GT   AMYLASE   HEPATITIS B CORE ANTIBODY, TOTAL   HEPATITIS B SURFACE ANTIBODY   HEPATITIS B SURFACE ANTIGEN   HEPATITIS C ANTIBODY   TRANSPLANT HEPATITIS C RNA, QUANTITATIVE, PCR   LACTATE DEHYDROGENASE   HIV ANTIGEN/ANTIBODY COMBO   HSV ANTIBODIES, IGG   SYPHILIS SCREEN   HEMOGLOBIN A1C   COVID SPIKE IGG   CMV IGG   TRANSPLANT IMMUNE STATUS - EBV   VARICELLA ZOSTER ANTIBODY, IGG   HLA FLOW CROSSMATCH RECIPIENT   HLA ANTIBODY SCREEN   CBC W/ DIFFERENTIAL    Narrative:     The following orders were created for panel order CBC w/ Differential.                  Procedure                               Abnormality         Status                                     ---------                               -----------         ------                                     CBC w/ [id number]]                                                                                          Please view results for these tests on the individual orders.   PROTIME-INR   APTT   ANTITHROMBIN III   FIBRINOGEN   FACTOR II ACTIVITY   FACTOR 7 ACTIVITY   TOXOPLASMA GONDII ANTIBODY, IGM   TOXOPLASMA GONDII ANTIBODY, IGG   URINALYSIS WITH MICROSCOPY   POCT ACTIVATED CLOTTING TIME   PREPARE RBC  PREPARE FRESH FROZEN PLASMA   PREPARE PLATELET PHERESIS   PREPARE FRESH FROZEN PLASMA   TYPE AND SCREEN   CBC W/ AUTO DIFF         Pertinent labs & imaging results that were available during my care of the patient were reviewed by me and considered in my medical decision making (see chart for details).    Please note- This chart has been created using [employer]. Chart creation errors have been sought, but may not always be located and such creation errors, especially pronoun confusion, do NOT reflect on the standard of medical care.       Olegario Messier, MD  Resident  12/11/21 [phone]

## 2021-12-11 NOTE — Unmapped (Incomplete)
Surgery History and Physical Note      Attending Physician:  Gemma Payor, MD  Inpatient Service:  Surg Transplant [facility])  Date: 12/11/2021      Assessment :  Matthew Key is a 61 y.o. 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  Patient presents today as a backup candidate for liver transplantation.      Plan:  - Admission to [facility]  - Pre-transplant laboratory workup is in progress  - Planned OR time: 12/11/2021 at 23h51m  - Planned induction therapy: Methylprednisolone    History of Present Illness:   Chief Complaint:  Liver Transplant    Matthew Key is a 61 y.o. male with history of ESLD secondary to *** who presents for evaluation for orthotopic liver transplant. They were initially diagnosed with liver disease in ***. They have *** ascites, which is managed with ***. They deny a history of bleeding varices***. They do not *** have a TIPS in place. They have *** encephalopathy, and take *** to manage this. They do *** have portal vein thrombosis. They are not *** on therapeutic anticoagulation. Their most recent MELD-Na was *** on ***. They are HCV ***. They do *** have HCC.      Allergies  Allergies   Allergen Reactions    Venom-Honey Bee Swelling         Medications    No current facility-administered medications on file prior to encounter.     Current Outpatient Medications on File Prior to Encounter   Medication Sig Dispense Refill    acetaminophen (TYLENOL) 325 MG tablet Take 2 tablets (650 mg total) by mouth every six (6) hours as needed.  0    atorvastatin (LIPITOR) 10 MG tablet Take 1 tablet (10 mg total) by mouth nightly.      carvediloL (COREG) 3.125 MG tablet Take 1 tablet (3.125 mg total) by mouth Two (2) times a day. 60 tablet 0    ciprofloxacin HCl (CIPRO) 500 MG tablet TAKE 1 TABLET BY MOUTH IN THE MORNING 30 tablet 0    [EXPIRED] flash glucose sensor kit 1 each by Other route every fourteen (14) days. Insert 1 sensor SQ q14 days; give #2 (30 day supply) or #6 (90 day supply) per pt preference 2 each 5    furosemide (LASIX) 20 MG tablet Take 2 tablets (40 mg total) by mouth daily. 60 tablet 0    gabapentin (NEURONTIN) 400 MG capsule Take 1 capsule (400 mg total) by mouth two (2) times a day. 60 capsule 0    hydrOXYzine (VISTARIL) 50 MG capsule       insulin syringe-needle U-100 1 mL 31 gauge x 5/16 (8 mm) Syrg Use once as needed for Hydrocortisone injection.. 10 each 0    [EXPIRED] lactulose 10 gram/15 mL solution Take 30 mL (20 g total) by mouth Three (3) times a day. 2700 mL 0    lisinopriL (PRINIVIL,ZESTRIL) 2.5 MG tablet Take 1 tablet (2.5 mg total) by mouth daily.      ondansetron (ZOFRAN-ODT) 4 MG disintegrating tablet TAKE 1 TABLET BY MOUTH EVERY 6 HOURS FOR 7 DAYS      ondansetron (ZOFRAN-ODT) 4 MG disintegrating tablet Take 1 tablet (4 mg total) by mouth every eight (8) hours as needed for nausea. 72 tablet 11    pantoprazole (PROTONIX) 40 MG tablet Take 1 tablet (40 mg total) by mouth daily. 30 tablet 0  pen needle, diabetic (BD ULTRA-FINE NANO PEN NEEDLE) 32 gauge x 5/32 (4 mm) Ndle ok to sub any brand or size needle preferred by insurance/patient, use 1-2x/day, dx E11.65 100 each 12    [EXPIRED] polyethylene glycol (GOLYTELY) 236-22.74-6.74 gram solution Take 4,000 mL by mouth once for 1 dose. Take as directed per [facility] prep instructions, for split bowel prep. 4000 mL 0    rifAXIMin (XIFAXAN) 550 mg Tab Take 1 tablet (550 mg total) by mouth Two (2) times a day. 60 tablet 5    semaglutide (OZEMPIC) 1 mg/dose (4 mg/3 mL) PnIj injection Inject 1 mg under the skin every seven (7) days. 3 mL 3    sodium chloride (AYR) 0.65 % Drop 1 spray.      spironolactone (ALDACTONE) 25 MG tablet Take 2 tablets (50 mg total) by mouth daily. 60 tablet 0    syringe with needle 3 mL 23 x 1 Syrg Use as needed for Hydrocortisone injection 1 each 0    [EXPIRED] TRESIBA FLEXTOUCH U-100 100 unit/mL (3 mL) InPn Inject 0.3 mL (30 Units total) under the skin at bedtime. Adjust as instructed. 15 mL 0    zinc sulfate 110 mg (25 mg elemental zinc) Tab Take 1 tablet (110 mg total) by mouth in the morning. 30 tablet 11         Past Medical History  Past Medical History:   Diagnosis Date    AKI (acute kidney injury) (CMS-HCC) 12/14/2020    Anxiety 10/22/2013    Arthritis     Cervical radiculopathy 12/03/2016    Chronic pain disorder     Lower back    Cirrhosis (CMS-HCC)     Dental abscess 10/2020    Duodenal ulcer 12/01/2017    GERD (gastroesophageal reflux disease)     History of transfusion     Hyperlipidemia 10/22/2013    Hypertension     under control with meds and weight loss    Liver disease     Sleep apnea, obstructive     Have machine    Stroke (CMS-HCC)     mild stroke    Type 2 diabetes mellitus with diabetic neuropathy, with long-term current use of insulin (CMS-HCC) 06/09/2014         Past Surgical History  Past Surgical History:   Procedure Laterality Date    KNEE SURGERY      PR CATH PLACE/CORON ANGIO, IMG SUPER/INTERP,R&L HRT CATH, L HRT VENTRIC N/A 12/19/2020    Procedure: CATH LEFT/RIGHT HEART CATHETERIZATION;  Surgeon: Rosana Hoes, MD;  Location: [facility] CATH;  Service: Cardiology    PR COLONOSCOPY FLX DX W/COLLJ SPEC WHEN PFRMD N/A 12/07/2021    Procedure: COLONOSCOPY, FLEXIBLE, PROXIMAL TO SPLENIC FLEXURE; DIAGNOSTIC, W/WO COLLECTION SPECIMEN BY BRUSH OR WASH;  Surgeon: Annie Paras, MD;  Location: [facility];  Service: Gastroenterology    PR UPPER GI ENDOSCOPY,BIOPSY N/A 10/12/2020    Procedure: UGI ENDOSCOPY; WITH BIOPSY, SINGLE OR MULTIPLE;  Surgeon: Marene Lenz, MD;  Location: [facility];  Service: Gastroenterology    PR UPPER GI ENDOSCOPY,LIGAT VARIX N/A 12/07/2021    Procedure: UGI ENDO; Everlene Balls LIG ESOPH &/OR GASTRIC VARICES;  Surgeon: Annie Paras, MD;  Location: [facility];  Service: Gastroenterology    ROOT CANAL      Front teeth Family History  Family History   Problem Relation Age of Onset    Edema Mother     Alzheimer's disease Father  Aortic dissection Brother     Early death Brother     Aneurysm Brother          Social History:  Social History     Tobacco Use    Smoking status: Never    Smokeless tobacco: Never   Vaping Use    Vaping Use: Never used   Substance Use Topics    Alcohol use: Not Currently    Drug use: Not Currently         Review of Systems  A 12 system review of systems was negative except as noted in HPI      Vital Signs    No data found.    Physical Exam  General Appearance: ***male in no acute distress. Alert and oriented x 3.   Head:  Normocephalic, atraumatic.  Eyes: Conjunctiva and lids appear normal. Pupils equal, round, and reactive to light. Sclera anicteric.  Nose: Nares grossly normal, no drainage.  Neck: Supple, symmetrical. No appreciable thyromegaly or nodules.   Pulmonary: Normal respiratory effort. Lungs clear to ausculation bilaterally. No rhonchi. No wheezes.  Cardiovascular: Regular rate and rhythm. No murmurs, rubs or gallops appreciated. Palpable radial, femoral***, and DP pulses  Abdomen: Soft, non-tender, without masses. No hepatosplenomegaly. No hernias. No signs of prior incisional scars  Musculoskeletal: Extremities without clubbing, cyanosis or edema.  Neurologic:  No motor abnormalities noted. Sensation grossly intact.  Lymphatic: No cervical or supraclavicular lymphadenopathy.   Skin:  Skin color normal. No rashes or lesions. No Jaundice  Psychiatric: Judgement and insight seem appropriate. Oriented to person, place and time.    Labs and Studies  Labs:  No results found for this or any previous visit (from the past 24 hour(s)).    Imaging:   ECHO ***:     Stress Test: ***    CT A/P: ***    MR Liver: ***

## 2021-12-11 NOTE — Unmapped (Signed)
Patient coming for blood test, scheduled for liver transplant.

## 2021-12-12 LAB — TRANSPLANT HEPATITIS C RNA, QUANTITATIVE, PCR: HCV RNA: NOT DETECTED

## 2021-12-12 LAB — HLA ANTIBODY SCREEN

## 2021-12-12 LAB — SYPHILIS SCREEN: SYPHILIS RPR SCREEN: NONREACTIVE

## 2021-12-12 MED ADMIN — lactated Ringers infusion: 10 mL/h | INTRAVENOUS | @ 16:00:00

## 2021-12-12 MED ADMIN — magnesium sulfate 2gm/50mL IVPB: 2 g | INTRAVENOUS | @ 18:00:00 | Stop: 2021-12-12

## 2021-12-12 MED ADMIN — magnesium sulfate 2gm/50mL IVPB: 2 g | INTRAVENOUS | @ 16:00:00 | Stop: 2021-12-12

## 2021-12-12 NOTE — Unmapped (Signed)
VENOUS ACCESS TEAM PROCEDURE    Order was placed for a PIV by Venous Access Team (VAT).  Patient was assessed at bedside for placement of a PIV. PPE were donned per protocol.  Access was obtained. Blood return noted.  Dressing intact and device well secured.  Flushed with normal saline.  See LDA for details.  Pt advised to inform RN of any s/s of discomfort at the PIV site.    Workup / Procedure Time:  30 minutes       Care Nurse RN was notified.       Thank you,     Cristal Deer RN Venous Access Team

## 2021-12-12 NOTE — Unmapped (Signed)
Surg Transplant [facility]) Discharge Summary    Admit date: 12/11/2021    Discharge date: 12/13/21    Discharge to:  Home    Discharge Service: Surg Transplant [facility])    Discharge Attending Physician: Gemma Payor, MD    Discharge Diagnoses: Backup for liver transplantation    Secondary Diagnosis: Active Problems:    * No active hospital problems. *      OR Procedures:  **Canceled**    LIVER ALLOTRANSPLANTATION; ORTHOTOPIC, PARTIAL OR WHOLE, FROM CADAVER OR LIVING DONOR, ANY AGE  Date  12/11/2021  -------------------     Ancillary Procedures: no procedures    Discharge Day Services: The patient was seen and examined by the Surg Transplant [facility]) team on the day of discharge. Vital signs and laboratory values were stable and within normal limits. Surgical wounds were inspected and found to be consistent with the performed procedure. Discharge plan was discussed, instructions were given, and all questions answered.    Subjective  No acute events overnight. No fever or chills.    Objective   Patient Vitals for the past 8 hrs:   BP Temp Temp src Pulse Resp SpO2   12/12/21 2354 116/54 36.8 ??C (98.2 ??F) Oral 91 16 97 %   12/12/21 2114 113/49 36.6 ??C (97.9 ??F) Oral 82 18 99 %     No intake/output data recorded.    Physical Exam  General Appearance: In no acute distress. Alert and oriented x 3.   Head:  Normocephalic, atraumatic.  Eyes: Conjunctiva and lids appear normal. Pupils equal, round, and reactive to light. Sclera anicteric.  Nose: Nares grossly normal, no drainage.  Neck: Supple, symmetrical. No appreciable thyromegaly or nodules.   Pulmonary: Normal respiratory effort. Lungs clear to ausculation bilaterally. No rhonchi. No wheezes.  Cardiovascular: Regular rate and rhythm. No murmurs, rubs or gallops appreciated. Palpable radial, femoral, and DP pulses  Abdomen: Soft, non-tender, without masses. No hepatosplenomegaly. No hernias. No signs of prior incisional scars  Musculoskeletal: Extremities without clubbing, cyanosis or edema.  Neurologic:  No motor abnormalities noted. Sensation grossly intact.  Lymphatic: No cervical or supraclavicular lymphadenopathy.   Skin:  Skin color normal. No rashes or lesions. No Jaundice  Psychiatric: Judgement and insight seem appropriate. Oriented to person, place and time.    HPI:   From Surg Transplant ([facility]) H&P:  Chief Complaint:  Liver Transplant     Matthew Key is a 61 y.o. 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     The patient reports being well in the interval history and has no concerns.  He denies any recent illnesses or GI symptoms.  He is able to conduct his daily routine without issues or fatigue.    Hospital Course:   The patient was admitted to the floors and received appropriate liver transplantation work-up.  However, the primary liver was not offered at this time on 5/2 and 5/3.  He was discharged and appropriate follow-up information was provided.    Condition at Discharge: Improved  Discharge Medications:      Your Medication List        CONTINUE taking these medications      acetaminophen 325 MG tablet  Commonly known as: TYLENOL  Take 2 tablets (650 mg total) by mouth every six (6) hours as needed.     atorvastatin 10 MG tablet  Commonly  known as: LIPITOR  Take 1 tablet (10 mg total) by mouth nightly.     carvediloL 3.125 MG tablet  Commonly known as: COREG  Take 1 tablet (3.125 mg total) by mouth Two (2) times a day.     ciprofloxacin HCl 500 MG tablet  Commonly known as: CIPRO  TAKE 1 TABLET BY MOUTH IN THE MORNING     furosemide 20 MG tablet  Commonly known as: LASIX  Take 2 tablets (40 mg total) by mouth daily.     gabapentin 400 MG capsule  Commonly known as: NEURONTIN  Take 1 capsule (400 mg total) by mouth two (2) times a day.     hydrOXYzine 50 MG capsule  Commonly known as: VISTARIL     insulin syringe-needle U-100 1 mL 31 gauge x 5/16 (8 mm) Syrg  Use once as needed for Hydrocortisone injection.Marland Kitchen     lisinopriL 2.5 MG tablet  Commonly known as: PRINIVIL,ZESTRIL  Take 1 tablet (2.5 mg total) by mouth daily.     ondansetron 4 MG disintegrating tablet  Commonly known as: ZOFRAN-ODT  TAKE 1 TABLET BY MOUTH EVERY 6 HOURS FOR 7 DAYS     ondansetron 4 MG disintegrating tablet  Commonly known as: ZOFRAN-ODT  Take 1 tablet (4 mg total) by mouth every eight (8) hours as needed for nausea.     OZEMPIC 1 mg/dose (4 mg/3 mL) Pnij injection  Generic drug: semaglutide  Inject 1 mg under the skin every seven (7) days.     pantoprazole 40 MG tablet  Commonly known as: PROTONIX  Take 1 tablet (40 mg total) by mouth daily.     pen needle, diabetic 32 gauge x 5/32 (4 mm) Ndle  Commonly known as: BD ULTRA-FINE NANO PEN NEEDLE  ok to sub any brand or size needle preferred by insurance/patient, use 1-2x/day, dx E11.65     sodium chloride 0.65 % Drop  Commonly known as: AYR  1 spray.     spironolactone 25 MG tablet  Commonly known as: ALDACTONE  Take 2 tablets (50 mg total) by mouth daily.     syringe with needle 3 mL 23 x 1 Syrg  Use as needed for Hydrocortisone injection     XIFAXAN 550 mg Tab  Generic drug: rifAXIMin  Take 1 tablet (550 mg total) by mouth Two (2) times a day.     zinc sulfate 110 mg (25 mg elemental zinc) Tab  Take 1 tablet (110 mg total) by mouth in the morning.            ASK your doctor about these medications      flash glucose sensor kit  1 each by Other route every fourteen (14) days. Insert 1 sensor SQ q14 days; give #2 (30 day supply) or #6 (90 day supply) per pt preference  Ask about: Should I take this medication?     lactulose 10 gram/15 mL solution  Take 30 mL (20 g total) by mouth Three (3) times a day.  Ask about: Should I take this medication?     polyethylene glycol 236-22.74-6.74 gram solution  Commonly known as: GOLYTELY  Take 4,000 mL by mouth once for 1 dose. Take as directed per [facility] prep instructions, for split bowel prep.  Ask about: Should I take this medication?     TRESIBA FLEXTOUCH U-100 100 unit/mL (3 mL) Inpn  Generic drug: insulin degludec  Inject 0.3 mL (30 Units total) under the skin at bedtime. Adjust as instructed.  Ask about: Should I take this medication?              Pending Test Results: None    Discharge Instructions:  Activity:     Diet:    Other Instructions:  Other Instructions       Discharge instructions      You were admitted for a possible liver transplant. Unfortunately, the organ was allocated to another patient or declined due to quality.     Please keep all appointments as previously scheduled. Please continue all medications as prescribed.     Call your transplant coordinator for any questions regarding transplant offers. Please call your primary doctor regarding any new symptoms or concerns with your medications.          Labs and Other Follow-ups after Discharge:  Follow Up instructions and Outpatient Referrals     Discharge instructions          Future Appointments:  Appointments which have been scheduled for you      Dec 17, 2021  9:20 AM  (Arrive by 9:05 AM)  RETURN  DIABETES with Arelia Longest, MD  [facility] DIABETES AND ENDOCRINOLOGY [place] [facility]) [address]  [place] [address]  [phone]        Dec 31, 2021  1:30 PM  (Arrive by 1:15 PM)  RETURN  HEPATOLOGY with Marene Lenz, MD  [facility] GI MEDICINE [place] [facility]) [address]  [place] [address]  [phone]        Jan 03, 2022  9:00 AM  (Arrive by 8:45 AM)  US ABDOMEN COMPLETE with HBR [place] RM 1  IMG ULTRASOUND [facility]) [address]  [place] [address]  [phone]   Preparing for the appointment:  Fasting before the appointment  You must refrain from eating or drinking prior to the appointment. However, if you are taking medications please take them as usual on the day of the procedure with a small sip of water.  Before the appointment  65-21 years old: Do not eat or drink anything for three (3) hours prior to the study.  5 years and older: Do not eat or drink anything for six (6) hours prior to the study.         Feb 26, 2022 11:45 AM  (Arrive by 11:30 AM)  MRI ABDOMEN W WO CONTRA    -UN with HBR MRI RM 1  IMG MRI [facility]) [address]  [place] [address]  [phone]   On appt date:  Bring recent lab work  Bring documentation of any metal object implants  Take meds as usual  Check w/physician if diabetic  You will be asked to change into a gown for your safety    On appt date do not:  Consume anything 2 hrs  Wear metallic items including jewelry (we are not responsible for lost items)    Let us know if pt:  Claustrophobic  Metal object implant  Pregnant  Prescribed a sedative  On dialysis  Allergic to MRI dye/contrast  Kidney Failure    (Title:MRIWCNTRST)

## 2021-12-12 NOTE — Unmapped (Signed)
Transplant Surgery Progress Note    Hospital Day: 2    Assessment:     Matthew Key is a 61 y.o. 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  Patient was declined for liver transplantation yesterday but remains as a backup for another possible transplantation today.    Interval Events:     No acute events overnight. Pain is well controlled.  Vital signs are stable. Urine output is appropriate.     Plan:     Neuro:   - Pain well controlled    CV:   - HDS, Maintain SBP < 180    Pulm:   - SORA    GI:   - F: N/A   - E: Replete as needed   - N: regular diet   - pepcid/pantoprazole, zofran, colace, miralax    Liver transplantation work-up complete    GU:   - Voiding spontaneously    Heme/ID:   - Afebrile, WBC and Hgb stable    Dispo   - Floor      Objective:        Vital Signs:  BP 106/61  - Pulse 91  - Temp 36.9 ??C (98.4 ??F) (Oral)  - Resp 18  - Ht 172.7 cm (5' 8)  - Wt 90 kg (198 lb 7.1 oz)  - SpO2 95%  - BMI 30.17 kg/m??     Input/Output:  No intake/output data recorded.    Physical Exam:    General: Cooperative, no distress, well appearing male  Head: Normocephalic, atraumatic  Eyes: Pupil equal and round, conjunctiva not injected  Neck: Midline trachea, symmetrical  Pulmonary: Normal work of breathing, equal bilateral chest rise  Cardiovascular: Regular rate and rhythm, No JVD  Abdomen: Soft, non-tender, non-distended. Surgical incisions and drain sites without signs of infection. Well healing.   Musculoskeletal: Moves bilateral upper and lower extremities spontaneously   Skin: No jaundice, rashes, erythema, or lesions. Skin color and texture normal.  Neurologic: Alert and interactive, grossly intact    Labs:  Lab Results   Component Value Date    WBC 5.6 12/11/2021    HGB 11.4 (L) 12/11/2021    HCT 32.6 (L) 12/11/2021    PLT 70 (L) 12/11/2021       Lab Results   Component Value Date    NA 135 12/11/2021    K 3.9 12/11/2021    CL 104 12/11/2021    CO2 28.0 12/11/2021    BUN 7 (L) 12/11/2021    CREATININE 1.01 12/11/2021    CALCIUM 9.2 12/11/2021    MG 1.5 (L) 12/11/2021    PHOS 3.4 12/11/2021       Microbiology Results (last day)       Procedure Component Value Date/Time Date/Time    COVID-19 PCR [[id number]]  (Normal) Collected: 12/11/21 1738    Lab Status: Final result Specimen: Nasopharyngeal Swab Updated: 12/11/21 1837     SARS-CoV-2 PCR Negative    Narrative:      This test was performed using the Cepheid Xpert Xpress SARS-CoV-2 assay which has been validated by the CLIA-certified, CAP-inspected [facility] Clinical [facility]. FDA has granted Emergency Use Authorization for this test. This real-time RT-PCR test detects SARS-CoV-2 by targeting the N2 and E genes. Negative results do not preclude SARS-CoV-2 infection and should not be used as the sole basis for patient management decisions. Negative results must  be combined with clinical observations, patient history, and epidemiological information. Information for providers and patients can be found here: [email or web address]              Imaging:  All pertinent imaging personally reviewed.

## 2021-12-12 NOTE — Unmapped (Addendum)
Patient was admitted for a possible transplant. Unfortunately, the organ was allocated to another patient or declined due to quality. Patient was seen and evaluated by transplant surgery and deemed appropriate for discharge home.

## 2021-12-12 NOTE — Unmapped (Cosign Needed)
Surgery History and Physical Note      Attending Physician:  Gemma Payor, MD  Inpatient Service:  Surg Transplant [facility])  Date: 12/11/2021      Assessment :  Matthew Key is a 61 y.o. 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  Patient presents today as a backup candidate for liver transplantation.      Plan:  - Admission to [facility]  - Pre-transplant laboratory workup is in progress  - Planned OR time: 12/11/2021 at 23h79m  - Planned induction therapy: Methylprednisolone    History of Present Illness:   Chief Complaint:  Liver Transplant    Matthew Key is a 61 y.o. 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    The patient reports being well in the interval history and has no concerns.  He denies any recent illnesses or GI symptoms.  He is able to conduct his daily routine without issues or fatigue.      Allergies  Allergies   Allergen Reactions    Venom-Honey Bee Swelling         Medications    Current Facility-Administered Medications on File Prior to Encounter   Medication Dose Route Frequency Provider Last Rate Last Admin    methylPREDNISolone sodium succinate (PF) (Solu-MEDROL) 500 mg in sodium chloride (NS) 0.9 % 50 mL IVPB  500 mg Intravenous For OR use Leretha Dykes, MD        piperacillin-tazobactam (ZOSYN) 3.375 g in sodium chloride 0.9 % (NS) 100 mL IVPB-connector bag  3.375 g Intravenous Once Leretha Dykes, MD         Current Outpatient Medications on File Prior to Encounter   Medication Sig Dispense Refill    acetaminophen (TYLENOL) 325 MG tablet Take 2 tablets (650 mg total) by mouth every six (6) hours as needed.  0    atorvastatin (LIPITOR) 10 MG tablet Take 1 tablet (10 mg total) by mouth nightly.      carvediloL (COREG) 3.125 MG tablet Take 1 tablet (3.125 mg total) by mouth Two (2) times a day. 60 tablet 0    ciprofloxacin HCl (CIPRO) 500 MG tablet TAKE 1 TABLET BY MOUTH IN THE MORNING 30 tablet 0    [EXPIRED] flash glucose sensor kit 1 each by Other route every fourteen (14) days. Insert 1 sensor SQ q14 days; give #2 (30 day supply) or #6 (90 day supply) per pt preference 2 each 5    furosemide (LASIX) 20 MG tablet Take 2 tablets (40 mg total) by mouth daily. 60 tablet 0    gabapentin (NEURONTIN) 400 MG capsule Take 1 capsule (400 mg total) by mouth two (2) times a day. 60 capsule 0    hydrOXYzine (VISTARIL) 50 MG capsule       insulin syringe-needle U-100 1 mL 31 gauge x 5/16 (8 mm) Syrg Use once as needed for Hydrocortisone injection.. 10 each 0    [EXPIRED] lactulose 10 gram/15 mL solution Take 30 mL (20 g total) by mouth Three (3) times a day. 2700 mL 0    lisinopriL (PRINIVIL,ZESTRIL) 2.5 MG tablet Take 1 tablet (2.5 mg total) by mouth daily.      ondansetron (ZOFRAN-ODT) 4 MG disintegrating tablet TAKE 1 TABLET BY MOUTH EVERY 6 HOURS FOR  7 DAYS      ondansetron (ZOFRAN-ODT) 4 MG disintegrating tablet Take 1 tablet (4 mg total) by mouth every eight (8) hours as needed for nausea. 72 tablet 11    pantoprazole (PROTONIX) 40 MG tablet Take 1 tablet (40 mg total) by mouth daily. 30 tablet 0    pen needle, diabetic (BD ULTRA-FINE NANO PEN NEEDLE) 32 gauge x 5/32 (4 mm) Ndle ok to sub any brand or size needle preferred by insurance/patient, use 1-2x/day, dx E11.65 100 each 12    [EXPIRED] polyethylene glycol (GOLYTELY) 236-22.74-6.74 gram solution Take 4,000 mL by mouth once for 1 dose. Take as directed per [facility] prep instructions, for split bowel prep. 4000 mL 0    rifAXIMin (XIFAXAN) 550 mg Tab Take 1 tablet (550 mg total) by mouth Two (2) times a day. 60 tablet 5    semaglutide (OZEMPIC) 1 mg/dose (4 mg/3 mL) PnIj injection Inject 1 mg under the skin every seven (7) days. 3 mL 3    sodium chloride (AYR) 0.65 % Drop 1 spray.      spironolactone (ALDACTONE) 25 MG tablet Take 2 tablets (50 mg total) by mouth daily. 60 tablet 0    syringe with needle 3 mL 23 x 1 Syrg Use as needed for Hydrocortisone injection 1 each 0    [EXPIRED] TRESIBA FLEXTOUCH U-100 100 unit/mL (3 mL) InPn Inject 0.3 mL (30 Units total) under the skin at bedtime. Adjust as instructed. 15 mL 0    zinc sulfate 110 mg (25 mg elemental zinc) Tab Take 1 tablet (110 mg total) by mouth in the morning. 30 tablet 11         Past Medical History  Past Medical History:   Diagnosis Date    AKI (acute kidney injury) (CMS-HCC) 12/14/2020    Anxiety 10/22/2013    Arthritis     Cervical radiculopathy 12/03/2016    Chronic pain disorder     Lower back    Cirrhosis (CMS-HCC)     Dental abscess 10/2020    Duodenal ulcer 12/01/2017    GERD (gastroesophageal reflux disease)     History of transfusion     Hyperlipidemia 10/22/2013    Hypertension     under control with meds and weight loss    Liver disease     Sleep apnea, obstructive     Have machine    Stroke (CMS-HCC)     mild stroke    Type 2 diabetes mellitus with diabetic neuropathy, with long-term current use of insulin (CMS-HCC) 06/09/2014         Past Surgical History  Past Surgical History:   Procedure Laterality Date    KNEE SURGERY      PR CATH PLACE/CORON ANGIO, IMG SUPER/INTERP,R&L HRT CATH, L HRT VENTRIC N/A 12/19/2020    Procedure: CATH LEFT/RIGHT HEART CATHETERIZATION;  Surgeon: Rosana Hoes, MD;  Location: [facility] CATH;  Service: Cardiology    PR COLONOSCOPY FLX DX W/COLLJ SPEC WHEN PFRMD N/A 12/07/2021    Procedure: COLONOSCOPY, FLEXIBLE, PROXIMAL TO SPLENIC FLEXURE; DIAGNOSTIC, W/WO COLLECTION SPECIMEN BY BRUSH OR WASH;  Surgeon: Annie Paras, MD;  [facility];  Service: Gastroenterology    PR UPPER GI ENDOSCOPY,BIOPSY N/A 10/12/2020    Procedure: UGI ENDOSCOPY; WITH BIOPSY, SINGLE OR MULTIPLE;  Surgeon: Marene Lenz, MD;  Location: [facility];  Service: Gastroenterology    PR UPPER GI ENDOSCOPY,LIGAT VARIX N/A 12/07/2021    Procedure: UGI ENDO; W/BAND LIG ESOPH &/OR  GASTRIC VARICES;  Surgeon: Annie Paras, MD;  [facility]: [facility];  Service: Gastroenterology    ROOT CANAL      Front teeth         Family History  Family History   Problem Relation Age of Onset    Edema Mother     Alzheimer's disease Father     Aortic dissection Brother     Early death Brother     Aneurysm Brother          Social History:  Social History     Tobacco Use    Smoking status: Never    Smokeless tobacco: Never   Vaping Use    Vaping Use: Never used   Substance Use Topics    Alcohol use: Not Currently    Drug use: Not Currently         Review of Systems  A 12 system review of systems was negative except as noted in HPI      Vital Signs    Patient Vitals for the past 8 hrs:   BP Temp Temp src Pulse Resp SpO2   12/11/21 1638 115/54 36.5 ??C (97.7 ??F) Oral 69 18 99 %   12/11/21 1430 112/61 36.8 ??C (98.2 ??F) -- 80 18 100 %   12/11/21 1427 -- -- -- 97 -- 95 %       Physical Exam  General Appearance: In no acute distress. Alert and oriented x 3.   Head:  Normocephalic, atraumatic.  Eyes: Conjunctiva and lids appear normal. Pupils equal, round, and reactive to light. Sclera anicteric.  Nose: Nares grossly normal, no drainage.  Neck: Supple, symmetrical. No appreciable thyromegaly or nodules.   Pulmonary: Normal respiratory effort. Lungs clear to ausculation bilaterally. No rhonchi. No wheezes.  Cardiovascular: Regular rate and rhythm. No murmurs, rubs or gallops appreciated. Palpable radial, femoral, and DP pulses  Abdomen: Soft, non-tender, without masses. No hepatosplenomegaly. No hernias. No signs of prior incisional scars  Musculoskeletal: Extremities without clubbing, cyanosis or edema.  Neurologic:  No motor abnormalities noted. Sensation grossly intact.  Lymphatic: No cervical or supraclavicular lymphadenopathy.   Skin:  Skin color normal. No rashes or lesions. No Jaundice  Psychiatric: Judgement and insight seem appropriate. Oriented to person, place and time.    Labs and Studies  Labs:  Recent Results (from the past 24 hour(s))   Prepare Plasma    Collection Time: 12/11/21  4:16 PM   Result Value Ref Range    Unit Blood Type B Pos     ISBT Number 7300     Unit # [id number]     Status Ready     Product ID FFP     PRODUCT CODE E2684V00     Unit Blood Type A Pos     ISBT Number 6200     Unit # [id number]     Status Ready     Product ID FFP     PRODUCT CODE [id number]     Unit Blood Type A Pos     ISBT Number 6200     Unit # [id number]     Status Ready     Product ID FFP     PRODUCT CODE M0102V25     Unit Blood Type A Neg     ISBT Number 0600     Unit # [id number]     Status Ready     Product ID FFP     PRODUCT CODE [id number]  Unit Blood Type B Pos     ISBT Number 7300     Unit # [id number]     Status Ready     Product ID FFP     PRODUCT CODE [id number]     Unit Blood Type B Pos     ISBT Number 7300     Unit # [id number]     Status Ready     Product ID FFP     PRODUCT CODE [id number]    ECG 12 lead    Collection Time: 12/11/21  4:57 PM   Result Value Ref Range    EKG Systolic BP  mmHg    EKG Diastolic BP  mmHg    EKG Ventricular Rate 66 BPM    EKG Atrial Rate 66 BPM    EKG P-R Interval 176 ms    EKG QRS Duration 86 ms    EKG Q-T Interval 446 ms    EKG QTC Calculation 467 ms    EKG Calculated P Axis 17 degrees    EKG Calculated R Axis 35 degrees    EKG Calculated T Axis 47 degrees    QTC Fredericia 460 ms       Imaging:   ECHO 03/07/2022  Summary    1. The left ventricle is upper normal in size with normal wall thickness.    2. The left ventricular systolic function is normal, LVEF is visually  estimated at 55-60%.    3. There is mild aortic regurgitation.    4. The left atrium is mildly to moderately dilated in size.    5. The right ventricle is normal in size, with normal systolic function.    6. The aorta at the sinuses of Valsalva and ascending aorta is mildly  dilated.    Stress Test: 01/18/2020  Summary    1. Stress echocardiogram is normal.      CT A/P: 08/25/2021  Mural thickening noted in the sigmoid colon, possibly related to underdistention versus mild colitis, appears new compared with 06/06/2021       Cirrhosis with evidence portal hypertension, as above, including moderate ascites and moderate to large right pleural effusion.       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 Largest appears moderately increased in size since 05/19/2018. Follow-up recommended as below       1.5 - 2.5 cm cyst, no MPD communication, OR cannot be determined        --EUS/FNA OR        --Reimage q109mo x 4, then q1y x 2, then q2y x 3             --STOP if stable over 10 years             --If interval Growth                  -- > 2.5 cm: EUS/FNA                  -- < 2.6 cm: EUS/FNA OR Reimage q27mo x 2, then q1y x 5 then q2y         MR Liver: 10/05/2021  1. Cirrhotic liver with heterogeneous enhancement and possible fibrosis, but no new enhancing masses with washout to suggest interval development of hepatocellular carcinoma.       2. Sequela of portal hypertension including ascites, splenomegaly and varices. Moderate ascites has increased when compared to the previous examination.       3. 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 Continued attention on follow-up to ensure stability.       4. New large right pleural effusion.

## 2021-12-12 NOTE — Unmapped (Signed)
VENOUS ACCESS TEAM PROCEDURE    Order was placed for a PIV by Venous Access Team (VAT).      MD order to insert PIV on day of surgery.  Patient is  backup candidate for liver transplantation.  Care RN to place order for PIV 1-2 hours prior to surgery to ensure patency.     Workup / Procedure Time:  15 minutes    RN was notified.       Thank you,     Townsend Roger RN Venous Access Team
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
## 2021-12-12 NOTE — Unmapped (Signed)
Problem: Adult Inpatient Plan of Care  Goal: Plan of Care Review  Outcome: Ongoing - Unchanged  Goal: Patient-Specific Goal (Individualized)  Outcome: Ongoing - Unchanged  Goal: Absence of Hospital-Acquired Illness or Injury  Outcome: Ongoing - Unchanged  Intervention: Identify and Manage Fall Risk  Recent Flowsheet Documentation  Taken 12/11/2021 2200 by Blythe Stanford, RN  Safety Interventions:   environmental modification   fall reduction program maintained   lighting adjusted for tasks/safety   low bed  Taken 12/11/2021 2000 by Blythe Stanford, RN  Safety Interventions:   fall reduction program maintained   environmental modification   low bed   lighting adjusted for tasks/safety  Intervention: Prevent Skin Injury  Recent Flowsheet Documentation  Taken 12/11/2021 2200 by Blythe Stanford, RN  Skin Protection: adhesive use limited  Taken 12/11/2021 2000 by Blythe Stanford, RN  Skin Protection: adhesive use limited  Intervention: Prevent and Manage VTE (Venous Thromboembolism) Risk  Recent Flowsheet Documentation  Taken 12/11/2021 2000 by Blythe Stanford, RN  Activity Management: activity adjusted per tolerance  Goal: Optimal Comfort and Wellbeing  Outcome: Ongoing - Unchanged  Goal: Readiness for Transition of Care  Outcome: Ongoing - Unchanged  Goal: Rounds/Family Conference  Outcome: Ongoing - Unchanged

## 2021-12-13 LAB — COVID SPIKE IGG
SARS-COV-2 SPIKE AB, INTERP, S: POSITIVE
SARS-COV-2 SPIKE AB, QUANT, S: >250 U/mL

## 2021-12-13 LAB — TRANSPLANT IMMUNE STATUS - EBV: EPSTEIN-BARR VCA IGG ANTIBODY: POSITIVE — AB

## 2021-12-13 LAB — CMV IGG: CMV IGG: POSITIVE — AB

## 2021-12-13 LAB — HSV ANTIBODIES, IGG
HERPES SIMPLEX VIRUS 1 IGG: POSITIVE — AB
HERPES SIMPLEX VIRUS 2 IGG: NEGATIVE
HSV 1 IGG OD: 50.7

## 2021-12-13 LAB — TOXOPLASMA GONDII ANTIBODY, IGG: TOXOPLASMA GONDII IGG: NEGATIVE

## 2021-12-13 LAB — TOXOPLASMA GONDII ANTIBODY, IGM: TOXOPLASMA IGM ANTIBODY: NEGATIVE

## 2021-12-13 LAB — VARICELLA ZOSTER ANTIBODY, IGG: VARICELLA ZOSTER IGG: POSITIVE

## 2021-12-13 NOTE — Unmapped (Signed)
Matthew Key remains adherent to treatment/medication regimen.Matthew Key was informed by transplant team that he would not receive a liver at this time and they would follow up accordingly.      Problem: Adult Inpatient Plan of Care  Goal: Plan of Care Review  Outcome: Ongoing - Unchanged  Goal: Patient-Specific Goal (Individualized)  Outcome: Ongoing - Unchanged  Goal: Absence of Hospital-Acquired Illness or Injury  Outcome: Ongoing - Unchanged  Intervention: Identify and Manage Fall Risk  Recent Flowsheet Documentation  Taken 12/13/2021 0400 by Ennis Forts, RN  Safety Interventions: fall reduction program maintained  Taken 12/13/2021 0200 by Ennis Forts, RN  Safety Interventions: fall reduction program maintained  Taken 12/12/2021 2200 by Ennis Forts, RN  Safety Interventions:   fall reduction program maintained   lighting adjusted for tasks/safety   low bed   nonskid shoes/slippers when out of bed  Taken 12/12/2021 2000 by Ennis Forts, RN  Safety Interventions:   low bed   lighting adjusted for tasks/safety   fall reduction program maintained   environmental modification   nonskid shoes/slippers when out of bed  Goal: Optimal Comfort and Wellbeing  Outcome: Ongoing - Unchanged  Goal: Readiness for Transition of Care  Outcome: Ongoing - Unchanged  Goal: Rounds/Family Conference  Outcome: Ongoing - Unchanged

## 2021-12-14 LAB — HEPATITIS B SURFACE ANTIBODY
HEPATITIS B SURFACE ANTIBODY QUANT: <8 m[IU]/mL (ref <8.00)
HEPATITIS B SURFACE ANTIBODY: NONREACTIVE

## 2021-12-14 LAB — HEPATITIS C ANTIBODY: HEPATITIS C ANTIBODY: NONREACTIVE

## 2021-12-14 LAB — HEPATITIS B CORE ANTIBODY, TOTAL: HEPATITIS B CORE TOTAL ANTIBODY: NONREACTIVE

## 2021-12-14 LAB — HIV ANTIGEN/ANTIBODY COMBO: HIV ANTIGEN/ANTIBODY COMBO: NONREACTIVE

## 2021-12-14 LAB — HEPATITIS B SURFACE ANTIGEN: HEPATITIS B SURFACE ANTIGEN: NONREACTIVE

## 2021-12-14 NOTE — Unmapped (Unsigned)
Endocrinology Clinic Follow Up Note    ASSESSMENT AND PLAN:     1. Type 2 diabetes, uncontrolled: CGM downloaded and interpreted x72 hours or more. Daily trends reviewed including patterns for ***. 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; may be candidate for SGLT-2 but will hold off for now as BGs well-controlled and BP low already on Lasix and Spironolactone  - RTC in 3 months    Diabetes-related complications:  - []  Retinopathy: Last eye exam around 02/2021, normal per patient  - []  Neuropathy: No symptoms, last foot exam 05/2021, discussed proper foot care  - []  Nephropathy:   Lab Results   Component Value Date    EGFR 84 12/11/2021   > on Lisinopril 2.5mg   - ASCVD: []  CAD/MI, []  CVA   Lab Results   Component Value Date    LDL 68 12/13/2019    HDL 63 12/13/2019    CHOL 161 12/13/2019    TRIG 95 12/13/2019   > on Atorvastatin 10mg   - []  HTN: No data found.    2. History of secondary AI; resolved: Thought to be due to chronic illness and steroid exposure (spinal injections) but appears to have improved based on last stim test 8.4 -> 22.9 (06/2021) with ACTH 18, previously 0.7 -> 9.3 (12/2020) with ACTH < 5. No s/sxs of residual adrenal insufficiency that would suggest underreplacement.     - S/p normal cosyntropin stim test (8.4 -> 22.9) in 06/2021, stopped daily Hydrocortisone and has also not needed PRN dose during illness    Patient was discussed with Dr. Tiburcio Pea.    Arelia Longest, M.D. PGY-5 Endocrinology Fellow  [facility] Endocrinology at [place]  Phone: [phone]     Fax:  [phone]    SUBJECTIVE:     Matthew Key is a 61 y.o. male with a PMHx of T2DM, AI, cirrhosis, CVA, and OSA on CPAP who is seen in follow-up regarding T2DM. I last saw him in 08/2021.     He was admitted recently at [facility] as a backup liver transplant candidate.    Regarding his diabetes, he was diagnosed 5 years ago, in his 51s. He is currently on Tresiba 40 units every morning and Ozempic 1mg  weekly. He has [employer] supplies but has not worn it over the past month due to cost, $70/month. He checks his BGs >5 times/day with fasting and mid-day BGs 100-150s. He does not have any issues with hypoglycemia. He eats 3 meals a day. He does not have any blurry vision, numbness, or tingling. He is currently on Atorvastatin. He has history of stroke.    Regarding his history of adrenal insufficiency, he denies lightheadedness other than occasional orthostatic symptoms. He has not needed any Hydrocortisone including during illness.    He is concerned about being on transplant list for some time but not being able to get transplant. He is crossing fingers, toes, etc. to get transplant over next few months.  Medical History Surgical History   Past Medical History:   Diagnosis Date   ??? AKI (acute kidney injury) (CMS-HCC) 12/14/2020   ??? Anxiety 10/22/2013   ??? Arthritis    ??? Cervical radiculopathy 12/03/2016   ??? Chronic pain disorder     Lower back   ??? Cirrhosis (CMS-HCC)    ??? Dental abscess 10/2020   ??? Duodenal ulcer 12/01/2017   ??? GERD (gastroesophageal reflux disease)    ??? History of transfusion    ??? Hyperlipidemia 10/22/2013   ??? Hypertension     under control with meds and weight loss   ??? Liver disease    ??? Sleep apnea, obstructive     Have machine   ??? Stroke (CMS-HCC)     mild stroke   ??? Type 2 diabetes mellitus with diabetic neuropathy, with long-term current use of insulin (CMS-HCC) 06/09/2014      Past Surgical History:   Procedure Laterality Date   ??? KNEE SURGERY     ??? PR CATH PLACE/CORON ANGIO, IMG SUPER/INTERP,R&L HRT CATH, L HRT VENTRIC N/A 12/19/2020    Procedure: CATH LEFT/RIGHT HEART CATHETERIZATION;  Surgeon: Rosana Hoes, MD;  Location: [facility] CATH;  Service: Cardiology   ??? PR COLONOSCOPY FLX DX W/COLLJ SPEC WHEN PFRMD N/A 12/07/2021    Procedure: COLONOSCOPY, FLEXIBLE, PROXIMAL TO SPLENIC FLEXURE; DIAGNOSTIC, W/WO COLLECTION SPECIMEN BY BRUSH OR WASH;  Surgeon: Annie Paras, MD;  [facility];  Service: Gastroenterology   ??? PR UPPER GI ENDOSCOPY,BIOPSY N/A 10/12/2020    Procedure: UGI ENDOSCOPY; WITH BIOPSY, SINGLE OR MULTIPLE;  Surgeon: Marene Lenz, MD;  [facility];  Service: Gastroenterology   ??? PR UPPER GI ENDOSCOPY,LIGAT VARIX N/A 12/07/2021    Procedure: UGI ENDO; W/BAND LIG ESOPH &/OR GASTRIC VARICES;  Surgeon: Annie Paras, MD;  Location: [facility];  Service: Gastroenterology   ??? ROOT CANAL      Front teeth          Social History Family History   Social History     Tobacco Use   ??? Smoking status: Never   ??? Smokeless tobacco: Never   Substance Use Topics   ??? Alcohol use: Not Currently      Family History   Problem Relation Age of Onset   ??? Edema Mother    ??? Alzheimer's disease Father    ??? Aortic dissection Brother    ??? Early death Brother    ??? Aneurysm Brother           Medications     Current Outpatient Medications:   ???  acetaminophen (TYLENOL) 325 MG tablet, Take 2 tablets (650 mg total) by mouth every six (6) hours as needed., Disp: , Rfl: 0  ???  atorvastatin (LIPITOR) 10 MG tablet, Take 1 tablet (10 mg total) by mouth nightly., Disp: , Rfl:   ???  carvediloL (COREG) 3.125 MG tablet, Take 1 tablet (3.125 mg total) by mouth Two (2) times a day., Disp: 60 tablet, Rfl: 0  ???  ciprofloxacin HCl (CIPRO) 500 MG tablet, TAKE 1 TABLET BY MOUTH IN THE MORNING, Disp: 30 tablet, Rfl: 0  ???  furosemide (LASIX) 20 MG tablet, Take 2 tablets (40 mg total) by mouth daily., Disp: 60 tablet, Rfl: 0  ???  gabapentin (NEURONTIN) 400 MG capsule, Take 1 capsule (400 mg total) by mouth two (2) times a day., Disp: 60 capsule, Rfl: 0  ???  hydrOXYzine (VISTARIL) 50 MG capsule, ,  Disp: , Rfl:   ???  insulin syringe-needle U-100 1 mL 31 gauge x 5/16 (8 mm) Syrg, Use once as needed for Hydrocortisone injection.., Disp: 10 each, Rfl: 0  ???  lisinopriL (PRINIVIL,ZESTRIL) 2.5 MG tablet, Take 1 tablet (2.5 mg total) by mouth daily., Disp: , Rfl:   ???  ondansetron (ZOFRAN-ODT) 4 MG disintegrating tablet, TAKE 1 TABLET BY MOUTH EVERY 6 HOURS FOR 7 DAYS, Disp: , Rfl:   ???  ondansetron (ZOFRAN-ODT) 4 MG disintegrating tablet, Take 1 tablet (4 mg total) by mouth every eight (8) hours as needed for nausea., Disp: 72 tablet, Rfl: 11  ???  pantoprazole (PROTONIX) 40 MG tablet, Take 1 tablet (40 mg total) by mouth daily., Disp: 30 tablet, Rfl: 0  ???  pen needle, diabetic (BD ULTRA-FINE NANO PEN NEEDLE) 32 gauge x 5/32 (4 mm) Ndle, ok to sub any brand or size needle preferred by insurance/patient, use 1-2x/day, dx E11.65, Disp: 100 each, Rfl: 12  ???  rifAXIMin (XIFAXAN) 550 mg Tab, Take 1 tablet (550 mg total) by mouth Two (2) times a day., Disp: 60 tablet, Rfl: 5  ???  semaglutide (OZEMPIC) 1 mg/dose (4 mg/3 mL) PnIj injection, Inject 1 mg under the skin every seven (7) days., Disp: 3 mL, Rfl: 3  ???  sodium chloride (AYR) 0.65 % Drop, 1 spray., Disp: , Rfl:   ???  spironolactone (ALDACTONE) 25 MG tablet, Take 2 tablets (50 mg total) by mouth daily., Disp: 60 tablet, Rfl: 0  ???  syringe with needle 3 mL 23 x 1 Syrg, Use as needed for Hydrocortisone injection, Disp: 1 each, Rfl: 0  ???  zinc sulfate 110 mg (25 mg elemental zinc) Tab, Take 1 tablet (110 mg total) by mouth in the morning., Disp: 30 tablet, Rfl: 11         Allergies   Allergies   Allergen Reactions   ??? Venom-Honey Bee Swelling          Review of Systems: 10 systems reviewed and were negative except for as noted in HPI and below.    OBJECTIVE:     Physical Exam:  There were no vitals taken for this visit.  General: Slightly ill-appearing Caucasian male in no apparent distress  HEENT: EOMI, no exophthalmos, sclera anicteric, no thyromegaly, no cushingoid features  Cardiovascular: Regular rate and rhythm; no murmurs, rubs, or gallops; 2+ radial pulses  Respiratory: Clear to auscultation bilaterally; no increased work of breathing on RA  Abdominal: Soft, non-distended, no lipohypertrophy  MSK: Normal station and gait; no edema  Neuro: Awake, alert, and oriented to person, place, and time; no focal deficits; no tremors  Psych: Normal mood and affect    Labs:  I personally reviewed labs available in Epic prior to the start of today's visit.    Labs are significant for:  Lab Results   Component Value Date    A1C 4.6 (L) 12/11/2021    A1C 5.8 (H) 08/25/2021    A1C 8.5 (H) 05/09/2021    A1C 7.6 (H) 03/05/2021    A1C 5.9 12/13/2019     Lab Results   Component Value Date    TRIG 95 12/13/2019    CHOL 149 12/13/2019    HDL 63 12/13/2019    LDL 68 12/13/2019     Lab Results   Component Value Date    TSH 1.185 06/05/2021    FREET4 1.22 06/05/2021    FREET3 2.17 (L) 12/21/2020     Lab Results  Component Value Date    CREATININE 1.01 12/11/2021     Lab Results   Component Value Date    NA 135 12/11/2021    K 3.9 12/11/2021    CL 104 12/11/2021    CO2 28.0 12/11/2021    BUN 7 (L) 12/11/2021    CREATININE 1.01 12/11/2021    AST 42 (H) 12/11/2021    PROT 7.0 12/11/2021    ALBUMIN 2.5 (L) 12/11/2021     No results found for: FSH, LH  No results found for: ESTRADIOL  No results found for: TESTOSTERONE  No results found for: PROLACTIN  No components found for: ILGF1  Lab Results   Component Value Date    CORTISOL 5.7 08/26/2021     No components found for: ALDOSTERON  No results found for: PTH  No results found for: CA  Lab Results   Component Value Date    ALBUMIN 2.5 (L) 12/11/2021    ALBUMIN 2.5 (L) 11/26/2021    ALBUMIN 2.0 (L) 10/23/2021     Lab Results   Component Value Date    PHOS 3.4 12/11/2021    PHOS 3.8 10/25/2021    PHOS 2.8 10/14/2021     No components found for: VITD26    Imaging:  I personally reviewed imaging available in Epic prior to the start of today's visit.    Imaging is significant for: N/A    I reviewed and summarized (above) records in preparation for today's visit all pertinent notes in Epic/Media and CareEverywhere as well as any sent records.

## 2021-12-18 NOTE — Unmapped (Signed)
Not our patient transferred

## 2021-12-22 NOTE — Unmapped (Signed)
Update on clinical assessment and refill coordination for Xifaxan:    Patient being taken to hospital today in [place] and may be transferred to [facility].   I will move this call to next week.    Corliss Skains. [place], [place].D.  Specialty Pharmacist  [facility]  [phone] option 4 then option 2

## 2021-12-22 NOTE — Unmapped (Signed)
Received on call page. Called back and spoke to pt's wife Cordelia Pen who reported sending pt to  [facility] for encephalopathy. They were in [place] on vacation. Provided pt's wife with transfer center phone number to consult with on call hepatologist. Pt's wife was very emotional and expressed how overwhelmed she is with pt's illness, her battling breast cancer, and her father having recently passed away. Suggested connecting with a local therapist. Pt's wife appreciated call and agreed that she would work on scheduling with a local therapist. Provided her with primary coordinator's contact information for future communication.Routed note to primary coordinator and transplant social worker for follow up.
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
## 2021-12-25 LAB — COMPREHENSIVE METABOLIC PANEL
A/G RATIO: 1.3 (ref 1.2–2.2)
ALBUMIN: 3.5 g/dL — ABNORMAL LOW (ref 3.8–4.8)
ALKALINE PHOSPHATASE: 125 IU/L — ABNORMAL HIGH (ref 44–121)
ALT (SGPT): 22 IU/L (ref 0–44)
AST (SGOT): 37 IU/L (ref 0–40)
BILIRUBIN TOTAL (MG/DL) IN SER/PLAS: 3.7 mg/dL — ABNORMAL HIGH (ref 0.0–1.2)
BLOOD UREA NITROGEN: 12 mg/dL (ref 8–27)
BUN / CREAT RATIO: 9 — ABNORMAL LOW (ref 10–24)
CALCIUM: 9 mg/dL (ref 8.6–10.2)
CHLORIDE: 96 mmol/L (ref 96–106)
CO2: 25 mmol/L (ref 20–29)
CREATININE: 1.35 mg/dL — ABNORMAL HIGH (ref 0.76–1.27)
GLOBULIN, TOTAL: 2.8 g/dL (ref 1.5–4.5)
GLUCOSE: 242 mg/dL — ABNORMAL HIGH (ref 70–99)
POTASSIUM: 3.8 mmol/L (ref 3.5–5.2)
SODIUM: 130 mmol/L — ABNORMAL LOW (ref 134–144)
TOTAL PROTEIN: 6.3 g/dL (ref 6.0–8.5)

## 2021-12-25 LAB — PROTIME-INR
INR: 1.6 — ABNORMAL HIGH (ref 0.9–1.2)
PROTHROMBIN TIME: 16.6 s — ABNORMAL HIGH (ref 9.1–12.0)

## 2021-12-25 NOTE — Unmapped (Signed)
Updated Matthew Key Police MELD score in [place] today with the following labs:  Recertification of  MELD is 25 via UNOS    [name] was also noted to have the following Mild Encephalopathy and Controlled Ascites and this was noted in his MELD upgrade today.  Lab Results   Component Value Date    CREATININE 1.35 (H) 12/24/2021    NA 130 (L) 12/24/2021    BILITOT 3.7 (H) 12/24/2021    ALB 3.5 (L) 12/24/2021    INR 1.6 (H) 12/24/2021

## 2021-12-25 NOTE — Unmapped (Signed)
I spent 65 minutes on the phone with the patient on the date of service. I spent an additional 20 minutes on pre- and post-visit activities on the date of service.     The patient was physically located in [place] or a state in which I am permitted to provide care. The patient and/or parent/guardian understood that s/he may incur co-pays and cost sharing, and agreed to the telemedicine visit. The visit was reasonable and appropriate under the circumstances given the patient's presentation at the time.    The patient and/or parent/guardian has been advised of the potential risks and limitations of this mode of treatment (including, but not limited to, the absence of in-person examination) and has agreed to be treated using telemedicine. The patient's/patient's family's questions regarding telemedicine have been answered.     If the visit was completed in an ambulatory setting, the patient and/or parent/guardian has also been advised to contact their provider???s office for worsening conditions, and seek emergency medical treatment and/or call 911 if the patient deems either necessary.    **THIS PATIENT WAS NOT SEEN IN PERSON TO MINIMIZE POTENTIAL SPREAD OF COVID-19, PROTECT PATIENTS/PROVIDERS, AND REDUCE PPE UTILIZATION.**      PATIENT NAME: Matthew Key                                         MR#: [id number]                   DOB: Jan 24, 1961        [facility]  CONFIDENTIAL SOCIAL WORK  LIVER TRANSPLANT INITIAL ASSESSMENT      DATE OF EVALUATION: 10/31/21     INFORMANTS: Patient/Matthew Key, sister/Matthew Key     PREFERRED LANGUAGE: English      PRESENTING MEDICAL PROBLEMS AND RELEVANT HISTORY:   Matthew Key is a 61 y.o. Caucasian malewho  has a past medical history of AKI (acute kidney injury) (CMS-HCC) (12/14/2020), Anxiety (10/22/2013), Arthritis, Cervical radiculopathy (12/03/2016), Chronic pain disorder, Cirrhosis (CMS-HCC), Dental abscess (10/2020), Duodenal ulcer (12/01/2017), GERD (gastroesophageal reflux disease), History of transfusion, Hyperlipidemia (10/22/2013), Hypertension, Liver disease, Sleep apnea, obstructive, Stroke (CMS-HCC), and Type 2 diabetes mellitus with diabetic neuropathy, with long-term current use of insulin (CMS-HCC) (06/09/2014).      TRANSPLANT PHASE:  Waitlist   TRANSPLANT STATUS:  Active     Matthew Key presents today for an updated liver transplant evaluation.       FUNCTIONAL STATUS:   Matthew Key presents as independent with all personal ADLs, including bathing, dressing, cooking, and household chores. Patient is driving.      Current use of DME: cane  Current limitations: fatigue, ascites   Current side effects 2/2 ESLD: above  Current activity level: has a gym membership  Hobbies/Interests: race team that travels around the country during the summer     UNDERSTANDING OF MEDICAL CONDITION AND TRANSPLANT:   Matthew Key and his spouse/Sherry attended the Transplant Orientation class on 05/19/18 in [place], [place].       Pt understands transplant process: well  Participated in TeachBack method: well  Information recall: hospitalization, lab work, follow up appointments  Education provided: caregiver expectations     Briefly reviewed the evaluation and listing process.  Reviewed the potential surgical complications of transplant such as but not limited to bleeding, blood clots, hernia, infection, rejection and death.  In addition, patient informed  of the potential psychological complications of chronic illness such as depression, anxiety, guilt and PTSD and advised that it is not uncommon for patients with a previous history of such to experience an increase in symptoms should they have a decline in their health.  Matthew Key his understanding.       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     COGNITIVE FUNCTIONING/HEALTH LITERACY: Ishan does seem cognitively intact. Reports a hx of hepatic encephalopathy related to his liver disease and was recently hospitalized due to this.     ATTITUDE ABOUT TRANSPLANT:   Patient desires transplant: yes  Expectations: extension of life, be 'as close to normal and feel good'    Fears/Concerns: denies     LIVING SITUATION AND FAMILY/SOCIAL SUPPORTS:   Citizenship Status: [place] Citizen  Social History: originally from [place], [place]  Marital Status: married since 02-27-98  Lives with: spouse   Children/Dependents:  61yr old daughter in college in [place], 72 year old son, and has 48 year old granddaughter  Extended family: brother and 2 sisters; reports a 'good' relationship; father passed in February 28, 2019, mom passed away unexpectedly in early Aug 2022.     Matthew Key reports feeling well supported at home by his friends and family.     COMPLIANCE HISTORY:   Medication list available: listed in Epic  Problems obtaining medications:                  Current: denies                Past: denies  Problems organizing medications: denies  Diet regime: inadequate. Poor appetite, limited intake. Saw Dietitian last month. Not meeting criteria for malnutrition but if he continues unintentional weight loss with poor intake, may meet criteria soon.   Barriers to attending medical care: denies     SUBSTANCE HISTORY:    Cigarette smoking:                Current: denies                Past: denies  Other tobacco/nicotine use:                Current: denies                Past: prior to 1990's he smoked occasional cigars   Alcohol use:                Current: denies                Past: reports monthly use up until diagnosis in April 2019; denied abuse   Illicit drug use:                Current: denies                Past: teenage use of marijuana recreationally      CHRONIC PAIN HISTORY:    Regular use of pain medications: 300mg  of gabapentin daily for his back                 Medications/Prescriber: PCP   Involvement with pain clinic: n/a      LEGAL HISTORY:   Matthew Key denies current or past history of legal issues, including jail/probation time, litigation, bankruptcy, gambling, or any other legal problems.      A review of [place] DPS Offender Public Information website revealed no history of criminal charges.  PSYCHIATRIC HISTORY:   Current issues: denies, though it should be noted spouse is now receiving cancer treatment   Past issues: 2019 PCP suggested therapy to address pt's health issues along with his father's worsening health and patient declined; noted to have situational depression and anxiety; noted baseline irritability in interview with patient in December 2019 from Transplant Psychologist with possible marital strain   Medications:                 Current: denies                             Past: anti-anxiety medication prior to MRI's   Therapy:                 Current: denies                Past: denies  SI/HI:                Current: denies                Past: denies  Hospitalizations:                Current: denies                Past: denies  Loss/Trauma/Violence:                Current: mom passed away in 2022/08/26unexpectedly. Father-in-law passed 10/14/21. Wife diagnosed with cancer last year.                 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 They do receive food stamps. Discussed option of potential bill assistance.   Monthly expenses: customary expenses; approved for [employer] program and received 6 months of rent paid by the state of [place]; working on having utility bill to be paid as well; reports a flexible/understanding landlord   Risk of losing home/transportation: denies  Hx of significant debts: no debt; paying on medical bills   Current income meets basic needs: Yes 'barely'  Fundraising efforts: discussed and provided resources      MEDICAL/PRESCRIPTION COVERAGE:   Insurance:                 Primary Coverage: [employer]                  Secondary Coverage: none                Tertiary Coverage: none  Problems with access/coverage of medical care:  denies     ACCESS  TO TRANSPORTATION:    Patient drives: yes  Access to reliable transportation: yes  Other sources of transportation:  spouse/Sherry; daughter/Olivia   Dialysis transportation: n/a     RELIGIOUS/SPIRITUAL AFFILIATION:   Patient identifies as: denies  Impact on healthcare: denies     HOME HEALTH/DME AGENCY:   Current: denies  Past: denies   Access to the following DME (not in current use): None  Home Modifications: None     PLAN FOR TRANSPLANTATION:   Discussed expectations for pre/post caregiving needs with  patient and sister.  Discussed the projected length of hospitalization , need for 24 hour supervision at the time of discharge, inability to drive for a 2-3 month period, need for lab work three times a week for the first two months after transplant and less often thereafter, need for weekly follow up at [facility] for the first month after transplant and less often thereafter and the lifetime need for medication and compliance in all areas.      Discussed importance of having alternative/secondary caregivers in the event that primary support people are not available at the time of transplant.  Discussed that patients with any mobility issues prior to transplant could need more physical assistance after abdominal surgery and/or could be at a higher risk of becoming deconditioned more quickly.  They indicated that they understood.       Current Plan: Matthew Key care giving plan has changed after wife's decline in health which she is receiving treatment for and seems to be stable per patient. Today, his sister/Matthew participated in discussion about transplant and post-op care. She agreed to fulfil the role of care giver, though they anticipate spouse/Sherry will still want to take an active role should her health allow it. Matthew Key has another sister Matthew Key) who can also be back-up along with son/Matthew Key.      Primary Support (updated): Sister/Matthew Low, lives local to patient, no limitations with driving, reports good health, able to take time off work.     Back-up Support: Matthew Key   Relationship to patient: wife  Age/DOB: 58  Education Level: 12th grade   Employment: not working due to health   Health: stable, on treatment. Has a lot of appointments, had breast reduction in September and is making good progress.   Support limitations: employment and health, does not drive/never had a Designer, industrial/product strengths: historical caregiver and lives in the home     Additional Back-ups:   Son/Matthew Key (lives in [place])  Secondary school teacher     TRAVEL TIME TO HOSPITAL: Approximately 1.5 hours via car to [facility] hospital.     EDUCATION: Verbal education on the following topics provided to patient and spouse :  Advance Care Planning  Fundraising for Transplant-related expenses  Long-term financial and vocational planning  Expectations for support planning both pre- and post-transplant  Common experience for LIVER patients to have depression and/or anxiety   Transplant social worker availability throughout transplant process     Written education or Dentist provided on the following topics:  Key, sports of Attorney Forms  [employer] and other financial resources  Working while Disabled  [employer]  Applying for disability and Medicare      Mr. Deaton and spouse indicated that they understood all education provided and did ask appropriate questions.      COLLATERAL CONTACT: Patient's spouse/Sherry Helminiak was present for the duration of interview and validated all information, including post-transplant support plan.  ASSESSMENT AND RECOMMENDATIONS:   Jguadalupe Opiela is a 61 y.o. 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 His cirrhosis has been complicated by ascites, hepatic encephalopathy and nonbleeding esophageal varices. Patient was internally approved but lost financial clearance - apparently this is getting worked out.  He has been internally listed at [facility] for liver transplant. This is his annual psychosocial update.     Mr. Fildes reports some changes since his last eval with Flint Melter, LCSW in 12/19/20 while he was inpatient. His wife/Sherry was diagnosed with serious health issues (stable, receiving treatment, attending appointments) so their financial situation and care giving plan has changed. Cordelia Pen is no longer working and is reportedly in process of applying for unemployment as her FMLA has run out (she was working PT). She would likely not be eligible for SSDI due to lack of work credits in [place] (she is from [place]) per patient. Care giving plan will continue to include Cordelia Pen as her health is stable for now, however she may need to attend to her own health as a priority. Patient's sister confirmed she can assist with transportation and any other needs including staying with patient.     He states that he has been adherent with his diet and medication; however, per Epic he has gained weight and it is recommended that this closely be monitored. He shares that he is intending on returning to the gym and his weight gain was following a knee procedure. He confirms he is adherent with his CPAP.     At this time, this CSW cannot identify any psychosocial barriers that would impact consideration for the transplant active listing.      SIPAT: 22; minimally acceptable candidate      Recommendations:   1. Close monitoring of care giving plan (due to spouse's health and sister's continued commitment)  2. Close monitoring of financial status   3. Patient will consider fundraising to assist with transplant related expenses and long-term planning.   4. Patient will consider completing [place] Advanced Directives HCPOA and/or Living Will at his desire and provide copy for [facility] electronic record.   5. CSW recommends annual follow-up to maintain record and provide continued support and encouragement.     Final decision regarding listing status is based upon committee review at selection meeting.    Loletta Specter, LCSW, CCTSW  Transplant Case Manager

## 2021-12-27 NOTE — Unmapped (Signed)
Call from [facility], Case manager at [facility] regarding pharmacy issues that they are requesting review.  Advised her to fax the documents to me and I will get the recommendations to Dr. Waynetta Sandy.  Scanned documents to chart and forwarded to Dr. Waynetta Sandy.

## 2021-12-28 NOTE — Unmapped (Signed)
Insurance has been verified  ??  ??Patient??now??has active coverage with??[employer] Adv.??  ??  Authorization [id number] Liver transplant??listing valid now 01/10/22 to 01/09/23  ??  ??Patient is financially cleared for Liver transplant??listing

## 2021-12-31 ENCOUNTER — Ambulatory Visit: Admit: 2021-12-31 | Discharge: 2022-01-01 | Payer: MEDICARE

## 2021-12-31 DIAGNOSIS — I8500 Esophageal varices without bleeding: Principal | ICD-10-CM

## 2021-12-31 DIAGNOSIS — K7290 Hepatic failure, unspecified without coma: Principal | ICD-10-CM

## 2021-12-31 DIAGNOSIS — K7460 Unspecified cirrhosis of liver: Principal | ICD-10-CM

## 2021-12-31 LAB — COMPREHENSIVE METABOLIC PANEL
ALBUMIN: 3.3 g/dL — ABNORMAL LOW (ref 3.4–5.0)
ALKALINE PHOSPHATASE: 222 U/L — ABNORMAL HIGH (ref 46–116)
ALT (SGPT): 24 U/L (ref 10–49)
ANION GAP: 4 mmol/L — ABNORMAL LOW (ref 5–14)
AST (SGOT): 51 U/L — ABNORMAL HIGH (ref <=34)
BILIRUBIN TOTAL: 3.9 mg/dL — ABNORMAL HIGH (ref 0.3–1.2)
BLOOD UREA NITROGEN: 12 mg/dL (ref 9–23)
BUN / CREAT RATIO: 10
CALCIUM: 9.2 mg/dL (ref 8.7–10.4)
CHLORIDE: 101 mmol/L (ref 98–107)
CO2: 26.6 mmol/L (ref 20.0–31.0)
CREATININE: 1.18 mg/dL — ABNORMAL HIGH
EGFR CKD-EPI (2021) MALE: 70 mL/min/{1.73_m2} (ref >=60)
GLUCOSE RANDOM: 103 mg/dL — ABNORMAL HIGH (ref 70–99)
POTASSIUM: 4.4 mmol/L (ref 3.4–4.8)
PROTEIN TOTAL: 6.6 g/dL (ref 5.7–8.2)
SODIUM: 132 mmol/L — ABNORMAL LOW (ref 135–145)

## 2021-12-31 LAB — PROTIME-INR
INR: 1.86
PROTIME: 21.6 s — ABNORMAL HIGH (ref 9.8–12.8)

## 2021-12-31 MED ORDER — LACTULOSE 10 GRAM/15 ML ORAL SOLUTION
Freq: Three times a day (TID) | ORAL | 10 refills | 30 days | Status: CP
Start: 2021-12-31 — End: 2022-11-26

## 2021-12-31 MED ORDER — GABAPENTIN 300 MG CAPSULE
ORAL_CAPSULE | Freq: Every evening | ORAL | 5 refills | 30 days | Status: CP
Start: 2021-12-31 — End: 2022-06-29

## 2021-12-31 MED ORDER — FUROSEMIDE 20 MG TABLET
ORAL_TABLET | Freq: Every day | ORAL | 2 refills | 30 days | Status: CP
Start: 2021-12-31 — End: 2022-03-31

## 2021-12-31 MED ORDER — SPIRONOLACTONE 50 MG TABLET
ORAL_TABLET | Freq: Every day | ORAL | 5 refills | 30 days | Status: CP
Start: 2021-12-31 — End: 2022-06-29
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
## 2021-12-31 NOTE — Unmapped (Addendum)
-  Repeat EGD to be done next available; can call [phone] to schedule  -Provided written and verbal instructions not to drive  -Continue lactulose to 3-4 times per day, this can be decreased if patient is experiencing diarrhea  -Continue rifaximin 550 mg BID and zinc supplementation daily  -Decrease gabapentin from 300 mg BID to 300 mg at night  -I will contact interventional radiology regarding embolization of splenorenal shunt  -Will increase to Lasix 60/spironolactone 100 mg  -Continue low sodium diet and weekly lab checks  -Continue cipro 500mg  qd indefinitely for SBP ppx  -Avoid additional QTc prolonging medications given prolonged QTc at baseline  -Continue ongoing care for DM  -MRI due in 02/2022 (ordered)  -Advised patient to cancel [place] given that he???s receiving MRI in 02/2022 (will serve as HCC surveillance)  -F/u on June 19th    Tawni Carnes, MD MPH  [facility]  Assistant Professor  Division of Gastroenterology and Hepatology      How to reach me:    MyChart Message: for non-emergency questions; may take up to 48-72 hours to respond    2. Nurse Line: Theophilus Kinds  Phone: [phone]  Fax: [phone]      Important Contact Numbers:    GI Clinic Appointments:? [phone]  Please call the GI Clinic appointment line if you need to schedule, reschedule or cancel an appointment in clinic.? They can also answer any questions you may have about where your appointment is located and when you need to arrive.    GI Procedure Appointments:?[phone]  Please call the GI Procedures line if you need to schedule, reschedule or cancel ANY type of procedure (EGD, colonoscopy, motility testing, etc).? You can also call this number for prep instructions, etc.?    Radiology: [phone], option 3 or 4  If you are being scheduled for any type of radiology, you will need to call to receive your appointment time.? Please call this number for information.?    For emergencies after normal business hours or on weekends/holidays  Proceed to the nearest emergency room OR contact the [facility] Operator at [phone] who can page the Gastroenterology Fellow on call.    Financial Assistance 480 444 7604 or email [email or web address]      Pharmacy Assistance: 7041993605    Counseling Resources: [email or web address]

## 2022-01-01 DIAGNOSIS — K7682 Hepatic encephalopathy (CMS-HCC): Principal | ICD-10-CM

## 2022-01-02 NOTE — Unmapped (Signed)
[facility] Clinical Assessment & Refill Coordination Note    Matthew Key, [place]: 12/21/1960  Phone: [phone] (home)     All above HIPAA information was verified with patient.     Was a Nurse, learning disability used for this call? No    Specialty Medication(s):   Infectious Disease: Xifaxan     Current Outpatient Medications   Medication Sig Dispense Refill    acetaminophen (TYLENOL) 325 MG tablet Take 2 tablets (650 mg total) by mouth every six (6) hours as needed.  0    atorvastatin (LIPITOR) 10 MG tablet Take 1 tablet (10 mg total) by mouth nightly.      ciprofloxacin HCl (CIPRO) 500 MG tablet TAKE 1 TABLET BY MOUTH IN THE MORNING 30 tablet 0    furosemide (LASIX) 20 MG tablet Take 3 tablets (60 mg total) by mouth daily. 90 tablet 2    gabapentin (NEURONTIN) 300 MG capsule Take 1 capsule (300 mg total) by mouth at bedtime. 30 capsule 5    hydrOXYzine (VISTARIL) 50 MG capsule       insulin syringe-needle U-100 1 mL 31 gauge x 5/16 (8 mm) Syrg Use once as needed for Hydrocortisone injection.. 10 each 0    lactulose 10 gram/15 mL solution Take 30 mL (20 g total) by mouth Three (3) times a day. 2700 mL 10    lisinopriL (PRINIVIL,ZESTRIL) 2.5 MG tablet Take 1 tablet (2.5 mg total) by mouth daily.      ondansetron (ZOFRAN-ODT) 4 MG disintegrating tablet TAKE 1 TABLET BY MOUTH EVERY 6 HOURS FOR 7 DAYS      ondansetron (ZOFRAN-ODT) 4 MG disintegrating tablet Take 1 tablet (4 mg total) by mouth every eight (8) hours as needed for nausea. 72 tablet 11    pantoprazole (PROTONIX) 40 MG tablet Take 1 tablet (40 mg total) by mouth daily. 30 tablet 0    pen needle, diabetic (BD ULTRA-FINE NANO PEN NEEDLE) 32 gauge x 5/32 (4 mm) Ndle ok to sub any brand or size needle preferred by insurance/patient, use 1-2x/day, dx E11.65 100 each 12    rifAXIMin (XIFAXAN) 550 mg Tab Take 1 tablet (550 mg total) by mouth Two (2) times a day. 60 tablet 5    semaglutide (OZEMPIC) 1 mg/dose (4 mg/3 mL) PnIj injection Inject 1 mg under the skin every seven (7) days. 3 mL 3    sodium chloride (AYR) 0.65 % Drop 1 spray.      spironolactone (ALDACTONE) 50 MG tablet Take 2 tablets (100 mg total) by mouth daily. 60 tablet 5    syringe with needle 3 mL 23 x 1 Syrg Use as needed for Hydrocortisone injection 1 each 0    zinc sulfate 110 mg (25 mg elemental zinc) Tab Take 1 tablet (110 mg total) by mouth in the morning. 30 tablet 11     No current facility-administered medications for this visit.        Changes to medications:  furosemide increased to 60mg , spironolactone increased to 100mg  and gabapentin lowered to 300mg  at night    Allergies   Allergen Reactions    Venom-Honey Bee Swelling       Changes to allergies: No    SPECIALTY MEDICATION ADHERENCE     Xifaxan 550 mg: approximately 14 days of medicine on hand       Medication Adherence    Patient reported X missed doses in the last month: 3  Specialty Medication: Xifaxan 550mg   Patient is on  additional specialty medications: No  Any gaps in refill history greater than 2 weeks in the last 3 months: yes  Demonstrates understanding of importance of adherence: yes  Informant: patient  Provider-estimated medication adherence level: good  Patient is at risk for Non-Adherence: No          Specialty medication(s) dose(s) confirmed: Regimen is correct and unchanged.     Are there any concerns with adherence? No    Adherence counseling provided? Not needed    CLINICAL MANAGEMENT AND INTERVENTION      Clinical Benefit Assessment:    Do you feel the medicine is effective or helping your condition? Yes    Clinical Benefit counseling provided? Not needed    Adverse Effects Assessment:    Are you experiencing any side effects? No    Are you experiencing difficulty administering your medicine? No    Quality of Life Assessment:      How many days over the past month did your hepatic encephalopathy  keep you from your normal activities? For example, brushing your teeth or getting up in the morning. 0    Have you discussed this with your provider? Not needed    Acute Infection Status:    Acute infections noted within Epic:  No active infections  Patient reported infection: None    Therapy Appropriateness:    Is therapy appropriate and patient progressing towards therapeutic goals? Yes, therapy is appropriate and should be continued    DISEASE/MEDICATION-SPECIFIC INFORMATION      N/A    PATIENT SPECIFIC NEEDS     Does the patient have any physical, cognitive, or cultural barriers? No    Is the patient high risk? No    Does the patient require a Care Management Plan? No       SHIPPING     Specialty Medication(s) to be Shipped:   Infectious Disease: Xifaxan    Other medication(s) to be shipped: No additional medications requested for fill at this time     Changes to insurance: No    Delivery Scheduled: Yes, Expected medication delivery date: 01/09/22.     Medication will be delivered via UPS to the confirmed prescription address in [facility].    The patient will receive a drug information handout for each medication shipped and additional FDA Medication Guides as required.  Verified that patient has previously received a Conservation officer, historic buildings and a Surveyor, mining.    The patient or caregiver noted above participated in the development of this care plan and knows that they can request review of or adjustments to the care plan at any time.      All of the patient's questions and concerns have been addressed.    Roderic Palau   [facility] Specialty Pharmacist

## 2022-01-03 ENCOUNTER — Ambulatory Visit: Admit: 2022-01-03 | Discharge: 2022-01-03 | Payer: MEDICARE

## 2022-01-03 ENCOUNTER — Encounter: Admit: 2022-01-03 | Discharge: 2022-01-03 | Payer: MEDICARE

## 2022-01-03 MED ADMIN — propofoL (DIPRIVAN) injection: INTRAVENOUS | @ 19:00:00 | Stop: 2022-01-03

## 2022-01-03 MED ADMIN — lidocaine (XYLOCAINE) 20 mg/mL (2 %) injection: INTRAVENOUS | @ 19:00:00 | Stop: 2022-01-03

## 2022-01-03 MED ADMIN — phenylephrine 0.8 mg/10 mL (80 mcg/mL) injection: INTRAVENOUS | @ 19:00:00 | Stop: 2022-01-03

## 2022-01-03 MED ADMIN — lactated Ringers infusion: 10 mL/h | INTRAVENOUS | @ 19:00:00 | Stop: 2022-01-03

## 2022-01-07 MED FILL — XIFAXAN 550 MG TABLET: ORAL | 30 days supply | Qty: 60 | Fill #1

## 2022-01-08 MED ORDER — PANTOPRAZOLE 40 MG TABLET,DELAYED RELEASE
ORAL_TABLET | Freq: Every day | ORAL | 0 refills | 30 days | Status: CP
Start: 2022-01-08 — End: ?

## 2022-01-09 DIAGNOSIS — K7210 Chronic hepatic failure without coma: Principal | ICD-10-CM

## 2022-01-09 DIAGNOSIS — R197 Diarrhea, unspecified: Principal | ICD-10-CM

## 2022-01-09 LAB — COMPREHENSIVE METABOLIC PANEL
A/G RATIO: 1 — ABNORMAL LOW (ref 1.2–2.2)
ALBUMIN/GLOBULIN RATIO: 1 — ABNORMAL LOW (ref 1.2–2.2)
ALBUMIN: 3.1 g/dL — ABNORMAL LOW (ref 3.8–4.8)
ALBUMIN: 3.1 g/dL — ABNORMAL LOW (ref 3.8–4.8)
ALKALINE PHOSPHATASE: 181 IU/L — ABNORMAL HIGH (ref 44–121)
ALKALINE PHOSPHATASE: 181 IU/L — ABNORMAL HIGH (ref 44–121)
ALT (SGPT): 19 IU/L (ref 0–44)
ALT (SGPT): 19 U/L (ref 0–44)
AST (SGOT): 40 IU/L (ref 0–40)
AST (SGOT): 40 Ul (ref 0–40)
BILIRUBIN TOTAL (MG/DL) IN SER/PLAS: 3 mg/dL — ABNORMAL HIGH (ref 0.0–1.2)
BILIRUBIN TOTAL: 3 /dL — ABNORMAL HIGH (ref 0.0–1.2)
BLOOD UREA NITROGEN: 17 mg/dL (ref 8–27)
BLOOD UREA NITROGEN: 17 mg/dL (ref 8–27)
BUN / CREAT RATIO: 12 (ref 10–24)
BUN / CREAT RATIO: 12 (ref 10–24)
CALCIUM: 9.7 mg/dL (ref 8.6–10.2)
CALCIUM: 9.7 mg/dL (ref 8.6–10.2)
CHLORIDE: 97 mmol/L (ref 96–106)
CHLORIDE: 97 mmol/L (ref 96–106)
CO2: 26 mmol/L (ref 20–29)
CO2: 26 mmol/L (ref 20–29)
CREATININE: 1.43 mg/dL — ABNORMAL HIGH (ref 0.76–1.27)
CREATININE: 1.43 mg/dL — ABNORMAL HIGH (ref 0.76–1.27)
GLOBULIN, TOTAL: 3.1 g/dL (ref 1.5–4.5)
GLOBULIN, TOTAL: 3.1 g/dL (ref 1.5–4.5)
GLUCOSE RANDOM: 173 mg/dL — ABNORMAL HIGH (ref 70–99)
GLUCOSE: 173 mg/dL — ABNORMAL HIGH (ref 70–99)
POTASSIUM: 4.5 mmol/L (ref 3.5–5.2)
POTASSIUM: 4.5 mmol/L (ref 3.5–5.2)
PROTEIN TOTAL: 6.2 g/dL (ref 6.0–8.5)
SODIUM: 133 mmol/L — ABNORMAL LOW (ref 134–144)
SODIUM: 133 mmol/L — ABNORMAL LOW (ref 134–144)
TOTAL PROTEIN: 6.2 g/dL (ref 6.0–8.5)

## 2022-01-09 LAB — PROTIME-INR
INR: 1.5 — ABNORMAL HIGH (ref 0.9–1.2)
INR: 1.5 — ABNORMAL HIGH (ref 0.9–1.2)
PROTHROMBIN TIME: 15.3 s — ABNORMAL HIGH (ref 9.1–12.0)
PROTIME: 15.3 s — ABNORMAL HIGH (ref 9.1–12.0)

## 2022-01-09 NOTE — Unmapped (Signed)
[facility] orders.

## 2022-01-09 NOTE — Unmapped (Signed)
Spoke to patient about plan of care for diagnostic testing re his diarrhea.  He will go to [facility] to give a stool sample today. Verbal orders entered per Dr. Waynetta Sandy.

## 2022-01-14 ENCOUNTER — Ambulatory Visit
Admit: 2022-01-14 | Discharge: 2022-01-15 | Disposition: A | Discharge status: Home / Self Care | Payer: MEDICARE | Attending: Student in an Organized Health Care Education/Training Program

## 2022-01-14 ENCOUNTER — Encounter
Admit: 2022-01-14 | Discharge: 2022-01-14 | Payer: MEDICARE | Attending: Student in an Organized Health Care Education/Training Program | Primary: Student in an Organized Health Care Education/Training Program

## 2022-01-14 LAB — COMPREHENSIVE METABOLIC PANEL
ALBUMIN: 2.7 g/dL — ABNORMAL LOW (ref 3.4–5.0)
ALKALINE PHOSPHATASE: 149 U/L — ABNORMAL HIGH (ref 46–116)
ALT (SGPT): 24 U/L (ref 10–49)
ANION GAP: 6 mmol/L (ref 5–14)
AST (SGOT): 48 U/L — ABNORMAL HIGH (ref <=34)
BILIRUBIN TOTAL: 3.7 mg/dL — ABNORMAL HIGH (ref 0.3–1.2)
BLOOD UREA NITROGEN: 19 mg/dL (ref 9–23)
BUN / CREAT RATIO: 12
CALCIUM: 8.7 mg/dL (ref 8.7–10.4)
CHLORIDE: 101 mmol/L (ref 98–107)
CO2: 27 mmol/L (ref 20.0–31.0)
CREATININE: 1.58 mg/dL — ABNORMAL HIGH
EGFR CKD-EPI (2021) MALE: 49 mL/min/{1.73_m2} — ABNORMAL LOW (ref >=60)
GLUCOSE RANDOM: 108 mg/dL (ref 70–179)
POTASSIUM: 3.8 mmol/L (ref 3.4–4.8)
PROTEIN TOTAL: 6.3 g/dL (ref 5.7–8.2)
SODIUM: 134 mmol/L — ABNORMAL LOW (ref 135–145)

## 2022-01-14 LAB — CBC W/ AUTO DIFF
BASOPHILS ABSOLUTE COUNT: 0 10*9/L (ref 0.0–0.1)
BASOPHILS RELATIVE PERCENT: 0.5 %
EOSINOPHILS ABSOLUTE COUNT: 0.2 10*9/L (ref 0.0–0.5)
EOSINOPHILS RELATIVE PERCENT: 4.1 %
HEMATOCRIT: 29.7 % — ABNORMAL LOW (ref 39.0–48.0)
HEMOGLOBIN: 10.3 g/dL — ABNORMAL LOW (ref 12.9–16.5)
LYMPHOCYTES ABSOLUTE COUNT: 1.9 10*9/L (ref 1.1–3.6)
LYMPHOCYTES RELATIVE PERCENT: 31.4 %
MEAN CORPUSCULAR HEMOGLOBIN CONC: 34.8 g/dL (ref 32.0–36.0)
MEAN CORPUSCULAR HEMOGLOBIN: 34.6 pg — ABNORMAL HIGH (ref 25.9–32.4)
MEAN CORPUSCULAR VOLUME: 99.5 fL — ABNORMAL HIGH (ref 77.6–95.7)
MEAN PLATELET VOLUME: 11.1 fL — ABNORMAL HIGH (ref 6.8–10.7)
MONOCYTES ABSOLUTE COUNT: 1 10*9/L — ABNORMAL HIGH (ref 0.3–0.8)
MONOCYTES RELATIVE PERCENT: 16.9 %
NEUTROPHILS ABSOLUTE COUNT: 2.8 10*9/L (ref 1.8–7.8)
NEUTROPHILS RELATIVE PERCENT: 47.1 %
PLATELET COUNT: 62 10*9/L — ABNORMAL LOW (ref 150–450)
RED BLOOD CELL COUNT: 2.99 10*12/L — ABNORMAL LOW (ref 4.26–5.60)
RED CELL DISTRIBUTION WIDTH: 15.6 % — ABNORMAL HIGH (ref 12.2–15.2)
WBC ADJUSTED: 6 10*9/L (ref 3.6–11.2)

## 2022-01-14 LAB — PROTIME-INR
INR: 1.84
PROTIME: 21.3 s — ABNORMAL HIGH (ref 9.8–12.8)

## 2022-01-14 LAB — MAGNESIUM: MAGNESIUM: 1.5 mg/dL — ABNORMAL LOW (ref 1.6–2.6)

## 2022-01-14 LAB — GAMMA GT: GAMMA GLUTAMYL TRANSFERASE: 19 U/L

## 2022-01-14 LAB — AMYLASE: AMYLASE: 94 U/L (ref 30–118)

## 2022-01-14 LAB — APTT
APTT: 41.9 s — ABNORMAL HIGH (ref 25.1–36.5)
HEPARIN CORRELATION: 0.2

## 2022-01-14 LAB — HEMOGLOBIN A1C
ESTIMATED AVERAGE GLUCOSE: 88 mg/dL
HEMOGLOBIN A1C: 4.7 % — ABNORMAL LOW (ref 4.8–5.6)

## 2022-01-14 LAB — FACTOR 7 ACTIVITY: FACTOR VII ACTIVITY: 20 % — ABNORMAL LOW (ref 61–146)

## 2022-01-14 LAB — PHOSPHORUS: PHOSPHORUS: 3 mg/dL (ref 2.4–5.1)

## 2022-01-14 LAB — FACTOR II ACTIVITY: FACTOR II ACTIVITY: 31 % — ABNORMAL LOW (ref 77–131)

## 2022-01-14 LAB — URIC ACID: URIC ACID: 6.2 mg/dL

## 2022-01-14 LAB — LACTATE DEHYDROGENASE: LACTATE DEHYDROGENASE: 204 U/L (ref 120–246)

## 2022-01-14 LAB — FIBRINOGEN: FIBRINOGEN LEVEL: 152 mg/dL — ABNORMAL LOW (ref 175–500)

## 2022-01-14 LAB — ANTITHROMBIN III: ANTITHROMB III, FUNC: 21 % — CL (ref 80–130)

## 2022-01-14 MED ADMIN — lactated Ringers infusion: 50 mL/h | INTRAVENOUS | @ 21:00:00

## 2022-01-14 NOTE — Unmapped (Signed)
History and Physical    Assessment:    Matthew Key is a 61 y.o. 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  Patient presents today as a first local backup candidate for liver transplantation.     Plan:    - Admit to SR[facility]  - Standard transplant labs  - Donor OR 2100, posted for 0100  - SSI  - Consent previously obtained on 12/14/21 (picture uploaded to media). This procedure has been fully reviewed with the patient and patient is still agreeable.     History of Present Illness:    Chief Complaint:  Liver transplant       Matthew Key is a 61 y.o. 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  Patient presents today as a first local backup candidate for liver transplantation.  Overall patient reports that he is doing fine. He denies any symptoms, including chest pain, abdominal pain, nausea, shortness of breath, fevers, chills, sore throat or congestion.      Allergies    Venom-honey bee      Medications      Current Facility-Administered Medications   Medication Dose Route Frequency Provider Last Rate Last Admin    dextrose (D10W) 10% bolus 125 mL  12.5 g Intravenous Q10 Min PRN Sharion Settler, MD        glucagon injection 1 mg  1 mg Intramuscular Once PRN Sharion Settler, MD        glucose chewable tablet 16 g  16 g Oral Q10 Min PRN Sharion Settler, MD        insulin lispro (HumaLOG) injection 0-20 Units  0-20 Units Subcutaneous Q6H Sharion Settler, MD        lactated Ringers infusion  10 mL/hr Intravenous Continuous Sharion Settler, MD        methylPREDNISolone sodium succinate (PF) (Solu-MEDROL) 500 mg in sodium chloride (NS) 0.9 % 50 mL IVPB  500 mg Intravenous For OR use Sharion Settler, MD        piperacillin-tazobactam (ZOSYN) 3.375 g in sodium chloride 0.9 % (NS) 100 mL IVPB-MBP  3.375 g Intravenous Q6H Sharion Settler, MD         Current Outpatient Medications   Medication Sig Dispense Refill    acetaminophen (TYLENOL) 325 MG tablet Take 2 tablets (650 mg total) by mouth every six (6) hours as needed.  0    atorvastatin (LIPITOR) 10 MG tablet Take 1 tablet (10 mg total) by mouth nightly.      ciprofloxacin HCl (CIPRO) 500 MG tablet TAKE 1 TABLET BY MOUTH IN THE MORNING 30 tablet 0    furosemide (LASIX) 20 MG tablet Take 3 tablets (60 mg total) by mouth daily. 90 tablet 2    gabapentin (NEURONTIN) 300 MG capsule Take 1 capsule (300 mg total) by mouth at bedtime. 30 capsule 5    hydrOXYzine (VISTARIL) 50 MG capsule       insulin syringe-needle U-100 1 mL 31 gauge x 5/16 (8 mm) Syrg Use once as needed for Hydrocortisone injection.. 10 each 0    lactulose 10 gram/15 mL solution Take 30 mL (20 g total) by mouth Three (3) times a day. 2700 mL 10    lisinopriL (PRINIVIL,ZESTRIL) 2.5 MG tablet Take 1 tablet (2.5 mg total) by mouth daily.      ondansetron (  ZOFRAN-ODT) 4 MG disintegrating tablet TAKE 1 TABLET BY MOUTH EVERY 6 HOURS FOR 7 DAYS      ondansetron (ZOFRAN-ODT) 4 MG disintegrating tablet Take 1 tablet (4 mg total) by mouth every eight (8) hours as needed for nausea. 72 tablet 11    pantoprazole (PROTONIX) 40 MG tablet Take 1 tablet (40 mg total) by mouth daily. 30 tablet 0    pen needle, diabetic (BD ULTRA-FINE NANO PEN NEEDLE) 32 gauge x 5/32 (4 mm) Ndle ok to sub any brand or size needle preferred by insurance/patient, use 1-2x/day, dx E11.65 100 each 12    rifAXIMin (XIFAXAN) 550 mg Tab Take 1 tablet (550 mg total) by mouth Two (2) times a day. 60 tablet 5    semaglutide (OZEMPIC) 1 mg/dose (4 mg/3 mL) PnIj injection Inject 1 mg under the skin every seven (7) days. 3 mL 3    sodium chloride (AYR) 0.65 % Drop 1 spray. (Patient not taking: Reported on 01/03/2022)      spironolactone (ALDACTONE) 50 MG tablet Take 2 tablets (100 mg total) by mouth daily. 60 tablet 5    syringe with needle 3 mL 23 x 1 Syrg Use as needed for Hydrocortisone injection 1 each 0    TRESIBA FLEXTOUCH U-100 100 unit/mL (3 mL) InPn Inject 0.3 mL (30 Units total) under the skin at bedtime. Adjust as instructed. 15 mL 0    zinc sulfate 110 mg (25 mg elemental zinc) Tab Take 1 tablet (110 mg total) by mouth in the morning. 30 tablet 11         Past Medical History    Past Medical History:   Diagnosis Date    AKI (acute kidney injury) (CMS-HCC) 12/14/2020    Anxiety 10/22/2013    Arthritis     Cervical radiculopathy 12/03/2016    Chronic pain disorder     Lower back    Cirrhosis (CMS-HCC)     Dental abscess 10/2020    Duodenal ulcer 12/01/2017    GERD (gastroesophageal reflux disease)     History of transfusion     Hyperlipidemia 10/22/2013    Hypertension     under control with meds and weight loss    Liver disease     Sleep apnea, obstructive     Have machine    Stroke (CMS-HCC)     mild stroke    Type 2 diabetes mellitus with diabetic neuropathy, with long-term current use of insulin (CMS-HCC) 06/09/2014         Past Surgical History    Past Surgical History:   Procedure Laterality Date    KNEE SURGERY      PR CATH PLACE/CORON ANGIO, IMG SUPER/INTERP,R&L HRT CATH, L HRT VENTRIC N/A 12/19/2020    Procedure: CATH LEFT/RIGHT HEART CATHETERIZATION;  Surgeon: Rosana Hoes, MD;  Location: [facility] CATH;  Service: Cardiology    PR COLONOSCOPY FLX DX W/COLLJ SPEC WHEN PFRMD N/A 12/07/2021    Procedure: COLONOSCOPY, FLEXIBLE, PROXIMAL TO SPLENIC FLEXURE; DIAGNOSTIC, W/WO COLLECTION SPECIMEN BY BRUSH OR WASH;  Surgeon: Annie Paras, MD;  [facility];  Service: Gastroenterology    PR UPPER GI ENDOSCOPY,BIOPSY N/A 10/12/2020    Procedure: UGI ENDOSCOPY; WITH BIOPSY, SINGLE OR MULTIPLE;  Surgeon: Marene Lenz, MD;  Location: [facility];  Service: Gastroenterology    PR UPPER GI ENDOSCOPY,DIAGNOSIS N/A 01/03/2022    Procedure: UGI ENDO, INCLUDE ESOPHAGUS, STOMACH, & DUODENUM &/OR JEJUNUM; DX W/WO COLLECTION SPECIMN, BY BRUSH OR WASH;  Surgeon: Marene Lenz, MD;  [facility]: [facility];  Service: Gastroenterology    PR UPPER GI ENDOSCOPY,LIGAT VARIX N/A 12/07/2021    Procedure: UGI ENDO; Everlene Balls LIG ESOPH &/OR GASTRIC VARICES;  Surgeon: Annie Paras, MD;  Location: [facility];  Service: Gastroenterology    ROOT CANAL      Front teeth         Family History    The patient's family history includes Alzheimer's disease in his father; Aneurysm in his brother; Aortic dissection in his brother; Early death in his brother; Edema in his mother..    Social History:    Social Determinants of Health     Financial Resource Strain: Low Risk     Difficulty of Paying Living Expenses: Not hard at all   Internet Connectivity: Not on file   Food Insecurity: No Food Insecurity    Worried About Programme researcher, broadcasting/film/video in the Last Year: Never true    Barista in the Last Year: Never true   Tobacco Use: Low Risk     Smoking Tobacco Use: Never    Smokeless Tobacco Use: Never    Passive Exposure: Not on file   Housing/Utilities: Low Risk     Within the past 12 months, have you ever stayed: outside, in a car, in a tent, in an overnight shelter, or temporarily in someone else's home (i.e. couch-surfing)?: No    Are you worried about losing your housing?: No    Within the past 12 months, have you been unable to get utilities (heat, electricity) when it was really needed?: No   Alcohol Use: Not on file   Transportation Needs: No Transportation Needs    Lack of Transportation (Medical): No    Lack of Transportation (Non-Medical): No   Substance Use: Not on file   Health Literacy: Not on file   Physical Activity: Not on file   Interpersonal Safety: Not on file   Stress: Not on file   Intimate Partner Violence: Not on file   Depression: Not on file   Social Connections: Not on file           Review of Systems    A 12 system review of systems was negative except as noted in HPI    Objective:     Vital Signs    Vitals:    01/14/22 1323   BP: 108/57   Pulse:    Resp: 16   Temp: 36.9 ??C (98.4 ??F)   SpO2:          Physical Exam    General Appearance:  No acute distress, well appearing and well nourished.   Head:  Normocephalic, atraumatic.   Eyes:  Conjuctiva and lids appear normal. Pupils equal and round, sclera anicteric.   Ears:  Overall appearance normal with no scars, lesions or masses.  Hearing is grossly normal.   Nose: Nares grossly normal, no drainage.   Throat: Lips, mucosa, and tongue normal; teeth and gums normal.   Neck: Supple, symmetrical, trachea midline, no adenopathy;     thyroid:  no enlargement/tenderness/nodules.   Pulmonary:    Normal respiratory effort.  Lungs were clear to auscultation bilaterally.   Cardiovascular:  Regular rate and rhythm, no murmur noted.  No thrill noted.   Abdomen:   Soft, non-tender, without masses.  No hepatosplenomegaly.  No hernias appreciated.   Musculoskeletal: Normal gait.  Extremities without clubbing, cyanosis, or edema.   Skin: Skin  color, texture, turgor normal, no rashes or lesions.   Neurologic: No motor abnormalities noted.  Sensation grossly intact.   Psychiatric: Judgement and insight appropriate.  Oriented to person, place, and time.         Test Results    All lab results last 24 hours:    Recent Results (from the past 24 hour(s))   ECG 12 lead    Collection Time: 01/14/22  2:07 PM   Result Value Ref Range    EKG Systolic BP  mmHg    EKG Diastolic BP  mmHg    EKG Ventricular Rate 68 BPM    EKG Atrial Rate 68 BPM    EKG P-R Interval 178 ms    EKG QRS Duration 88 ms    EKG Q-T Interval 432 ms    EKG QTC Calculation 459 ms    EKG Calculated P Axis 21 degrees    EKG Calculated R Axis 10 degrees    EKG Calculated T Axis -2 degrees    QTC Fredericia 450 ms       Imaging:     ECG 12 lead    Result Date: 01/14/2022  NORMAL SINUS RHYTHM NORMAL ECG WHEN COMPARED WITH ECG OF 11-Dec-2021 16:57, T WAVE INVERSION NOW EVIDENT IN INFERIOR LEADS

## 2022-01-14 NOTE — Unmapped (Signed)
Liver transplant pt called in

## 2022-01-14 NOTE — Unmapped (Signed)
Patient called by Transplant team for a possible liver transplant

## 2022-01-14 NOTE — Unmapped (Signed)
Admission note:  Chart reviewed and case reviewed with primary nurse.     Patient is a 61 y.o. 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  Patient presents today as a first local backup candidate for liver transplantation.   Patient is A&OX4. VS'S. Denies pain. Care plan initiated. Admission completed. All questions answered.

## 2022-01-15 LAB — HSV ANTIBODIES, IGG
HERPES SIMPLEX VIRUS 1 IGG: POSITIVE — AB
HERPES SIMPLEX VIRUS 2 IGG: NEGATIVE
HSV 2 IGG OD: 0.408

## 2022-01-15 LAB — HEPATITIS B SURFACE ANTIGEN: HEPATITIS B SURFACE ANTIGEN: NONREACTIVE

## 2022-01-15 LAB — HEPATITIS C ANTIBODY: HEPATITIS C ANTIBODY: NONREACTIVE

## 2022-01-15 LAB — CMV IGG: CMV IGG: POSITIVE — AB

## 2022-01-15 LAB — HEPATITIS B SURFACE ANTIBODY
HEPATITIS B SURFACE ANTIBODY QUANT: <8 m[IU]/mL (ref <8.00)
HEPATITIS B SURFACE ANTIBODY: NONREACTIVE

## 2022-01-15 LAB — HIV ANTIGEN/ANTIBODY COMBO: HIV ANTIGEN/ANTIBODY COMBO: NONREACTIVE

## 2022-01-15 LAB — TOXOPLASMA GONDII ANTIBODY, IGG: TOXOPLASMA GONDII IGG: NEGATIVE

## 2022-01-15 LAB — SYPHILIS SCREEN: SYPHILIS RPR SCREEN: NONREACTIVE

## 2022-01-15 LAB — HEPATITIS B CORE ANTIBODY, TOTAL: HEPATITIS B CORE TOTAL ANTIBODY: NONREACTIVE

## 2022-01-15 LAB — TRANSPLANT HEPATITIS C RNA, QUANTITATIVE, PCR: HCV RNA: NOT DETECTED

## 2022-01-15 LAB — TOXOPLASMA GONDII ANTIBODY, IGM: TOXOPLASMA IGM ANTIBODY: NEGATIVE

## 2022-01-15 LAB — VARICELLA ZOSTER ANTIBODY, IGG: VARICELLA ZOSTER IGG: POSITIVE

## 2022-01-15 MED ADMIN — atorvastatin (LIPITOR) tablet 10 mg: 10 mg | ORAL | @ 02:00:00

## 2022-01-15 MED ADMIN — gabapentin (NEURONTIN) capsule 300 mg: 300 mg | ORAL | @ 02:00:00

## 2022-01-15 NOTE — Unmapped (Signed)
Discharge Summary    Admit date: 01/14/2022    Discharge date and time: 01/15/22    Discharge to:  Home    Discharge Service: Emergency Medicine    Discharge Attending Physician: Gemma Payor, MD    Discharge  Diagnoses: Liver transplant candidate     Secondary Diagnosis: Active Problems:    * No active hospital problems. *  Resolved Problems:    * No resolved hospital problems. *      OR Procedures:  none     Ancillary Procedures: no procedures    Discharge Day Services:     Subjective   Pain Controlled. No fever or chills.    Objective   Patient Vitals for the past 8 hrs:   BP Pulse SpO2 Pulse SpO2   01/15/22 0000 123/63 77 -- 95 %   01/14/22 2300 109/61 83 -- 97 %   01/14/22 2100 123/54 87 88 97 %     No intake/output data recorded.    General Appearance:   No acute distress  Lungs:                Clear to auscultation bilaterally  Heart:                           Regular rate and rhythm  Abdomen:                Soft, non-tender, non-distended  Extremities:              Warm and well perfused    Hospital Course:   Patient was admitted as first local backup for liver transplant. Primary center accepted the organ, and the patient was discharged shortly thereafter.     Condition at Discharge: Improved  Discharge Medications:      Medication List      CONTINUE taking these medications     acetaminophen 325 MG tablet; Commonly known as: TYLENOL; Take 2 tablets   (650 mg total) by mouth every six (6) hours as needed.   atorvastatin 10 MG tablet; Commonly known as: LIPITOR   ciprofloxacin HCl 500 MG tablet; Commonly known as: CIPRO; TAKE 1 TABLET   BY MOUTH IN THE MORNING   furosemide 20 MG tablet; Commonly known as: LASIX; Take 3 tablets (60 mg   total) by mouth daily.   gabapentin 300 MG capsule; Commonly known as: NEURONTIN; Take 1 capsule   (300 mg total) by mouth at bedtime.   hydrOXYzine 50 MG capsule; Commonly known as: VISTARIL   insulin syringe-needle U-100 1 mL 31 gauge x 5/16 (8 mm) Syrg; Use once   as needed for Hydrocortisone injection.Marland Kitchen   lactulose 10 gram/15 mL solution; Take 30 mL (20 g total) by mouth Three   (3) times a day.   lisinopriL 2.5 MG tablet; Commonly known as: PRINIVIL,ZESTRIL   * ondansetron 4 MG disintegrating tablet; Commonly known as: ZOFRAN-ODT   * ondansetron 4 MG disintegrating tablet; Commonly known as: ZOFRAN-ODT;   Take 1 tablet (4 mg total) by mouth every eight (8) hours as needed for   nausea.   OZEMPIC 1 mg/dose (4 mg/3 mL) Pnij injection; Generic drug: semaglutide;   Inject 1 mg under the skin every seven (7) days.   pantoprazole 40 MG tablet; Commonly known as: PROTONIX; Take 1 tablet   (40 mg total) by mouth daily.   pen needle, diabetic 32 gauge x 5/32 (4 mm) Ndle; Commonly known as: BD  ULTRA-FINE NANO PEN NEEDLE; ok to sub any brand or size needle preferred   by insurance/patient, use 1-2x/day, dx E11.65   spironolactone 50 MG tablet; Commonly known as: ALDACTONE; Take 2   tablets (100 mg total) by mouth daily.   syringe with needle 3 mL 23 x 1 Syrg; Use as needed for Hydrocortisone   injection   TRESIBA FLEXTOUCH U-100 100 unit/mL (3 mL) Inpn; Generic drug: insulin   degludec; Inject 0.3 mL (30 Units total) under the skin at bedtime. Adjust   as instructed.   XIFAXAN 550 mg Tab; Generic drug: rifAXIMin; Take 1 tablet (550 mg   total) by mouth Two (2) times a day.   zinc sulfate 110 mg (25 mg elemental zinc) Tab; Take 1 tablet (110 mg   total) by mouth in the morning.  * This list has 2 medication(s) that are the same as other medications   prescribed for you. Read the directions carefully, and ask your doctor or   other care provider to review them with you.     ASK your doctor about these medications     sodium chloride 0.65 % Drop; Commonly known as: AYR       Pending Test Results:     Discharge Instructions:  Activity:     Diet:    Other Instructions:  Other Instructions       Discharge instructions      Please resume all home medications and continue with your planned outpatient, scheduled appointments     If you have any questions. contact your transplant coordinator or the Transplant Surgery Office [phone]) during business hours or page the transplant coordinator on call [phone]) after business hours          Labs and Other Follow-ups after Discharge:  Follow Up instructions and Outpatient Referrals     Discharge instructions          Future Appointments:  Appointments which have been scheduled for you      Jan 17, 2022  2:00 PM  (Arrive by 1:30 PM)  NEW  GENERAL with Braulio Conte, MD  [facility] VIR CLINIC [address] [facility]) [address]  [place] [address]  [phone]        Jan 28, 2022  1:00 PM  (Arrive by 12:45 PM)  RETURN  HEPATOLOGY with Marene Lenz, MD  [facility] GI MEDICINE [place] [facility] REGION) [address]  [place] [address]  [phone]        Feb 26, 2022 11:45 AM  (Arrive by 11:30 AM)  MRI ABDOMEN W WO CONTRA    -UN with [facility] MRI RM 1  IMG MRI [facility]) [address]  [place] [address]  [phone]   On appt date:  Bring recent lab work  Bring documentation of any metal object implants  Take meds as usual  Check w/physician if diabetic  You will be asked to change into a gown for your safety    On appt date do not:  Consume anything 2 hrs  Wear metallic items including jewelry (we are not responsible for lost items)    Let us know if pt:  Claustrophobic  Metal object implant  Pregnant  Prescribed a sedative  On dialysis  Allergic to MRI dye/contrast  Kidney Failure    (Title:[id number])         Mar 25, 2022 12:00  PM  (Arrive by 11:30 AM)  RETURN 15 with Chirag Lanney Gins, MD  [facility] TRANSPLANT SURGERY [place] [facility] REGION) [address]  [place] [address]  [phone]

## 2022-01-15 NOTE — Unmapped (Signed)
Updated [name] MELD score in [place] today with the following labs:  Recertification of  MELD is 24 ia UNOS    Matthew Key was also noted to have the following Mild Encephalopathy and Controlled Ascites and this was noted in his MELD upgrade today.  Lab Results   Component Value Date    CREATININE 1.58 (H) 01/14/2022    NA 134 (L) 01/14/2022    BILITOT 3.7 (H) 01/14/2022    ALBUMIN 2.7 (L) 01/14/2022    INR 1.84 01/14/2022

## 2022-01-16 LAB — COVID SPIKE IGG
SARS-COV-2 SPIKE AB, INTERP, S: POSITIVE
SARS-COV-2 SPIKE AB, QUANT, S: >250 U/mL

## 2022-01-16 LAB — TRANSPLANT IMMUNE STATUS - EBV: EPSTEIN-BARR VCA IGG ANTIBODY: POSITIVE — AB

## 2022-01-16 LAB — HLA ANTIBODY SCREEN

## 2022-01-17 ENCOUNTER — Ambulatory Visit
Admit: 2022-01-17 | Discharge: 2022-01-18 | Payer: MEDICARE | Attending: Diagnostic Radiology | Primary: Diagnostic Radiology

## 2022-01-17 DIAGNOSIS — K7682 Hepatic encephalopathy (CMS-HCC): Principal | ICD-10-CM

## 2022-01-17 NOTE — Unmapped (Signed)
[facility] - INITIAL VISIT AND CONSULTATION    Patient Name: Matthew Key  Patient Age: 61 y.o.  Encounter Date: 01/17/2022    REFERRING PHYSICIAN: Marene Lenz, MD  [address]  [place],  [place] [address]    PRIMARY CARE PROVIDER: Loman Brooklyn, MD      Subjective:     Chief Complaint: Encephalopathy    History of Present Illness:  Matthew Key is a 61 y.o. male who is seen at the request of Marene Lenz, MD for encephalopathy. 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 Also haso has h/o CVA, OSA, T2DM, HTN, HLD. 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 He takes lactulose and rifaxamin. On CT, he has a large splenorenal shunt. He is actively listed for liver transplant.    Review of Systems: Pertinent positives and negatives are noted in the HPI above. Review of systems otherwise negative. No other complaints.    Past Medical/Surgical History:  Past Medical History:   Diagnosis Date    AKI (acute kidney injury) (CMS-HCC) 12/14/2020    Anxiety 10/22/2013    Arthritis     Cervical radiculopathy 12/03/2016    Chronic pain disorder     Lower back    Cirrhosis (CMS-HCC)     Dental abscess 10/2020    Duodenal ulcer 12/01/2017    GERD (gastroesophageal reflux disease)     History of transfusion     Hyperlipidemia 10/22/2013    Hypertension     under control with meds and weight loss    Liver disease     Sleep apnea, obstructive     Have machine    Stroke (CMS-HCC)     mild stroke    Type 2 diabetes mellitus with diabetic neuropathy, with long-term current use of insulin (CMS-HCC) 06/09/2014       Past Surgical History:   Procedure Laterality Date    KNEE SURGERY      PR CATH PLACE/CORON ANGIO, IMG SUPER/INTERP,R&L HRT CATH, L HRT VENTRIC N/A 12/19/2020    Procedure: CATH LEFT/RIGHT HEART CATHETERIZATION;  Surgeon: Rosana Hoes, MD;  Location: [facility] CATH;  Service: Cardiology    PR COLONOSCOPY FLX DX W/COLLJ SPEC WHEN PFRMD N/A 12/07/2021    Procedure: COLONOSCOPY, FLEXIBLE, PROXIMAL TO SPLENIC FLEXURE; DIAGNOSTIC, W/WO COLLECTION SPECIMEN BY BRUSH OR WASH;  Surgeon: Annie Paras, MD;  [facility];  Service: Gastroenterology    PR UPPER GI ENDOSCOPY,BIOPSY N/A 10/12/2020    Procedure: UGI ENDOSCOPY; WITH BIOPSY, SINGLE OR MULTIPLE;  Surgeon: Marene Lenz, MD;  [facility];  Service: Gastroenterology    PR UPPER GI ENDOSCOPY,DIAGNOSIS N/A 01/03/2022    Procedure: UGI ENDO, INCLUDE ESOPHAGUS, STOMACH, & DUODENUM &/OR JEJUNUM; DX W/WO COLLECTION SPECIMN, BY BRUSH OR WASH;  Surgeon: Marene Lenz, MD;  [facility];  Service: Gastroenterology    PR UPPER GI ENDOSCOPY,LIGAT VARIX N/A 12/07/2021    Procedure: UGI ENDO; Everlene Balls LIG ESOPH &/OR GASTRIC VARICES;  Surgeon: Annie Paras, MD;  [facility];  Service: Gastroenterology    ROOT CANAL      Front teeth       Family History:  Patient family history includes Alzheimer's disease in his father; Aneurysm in his brother; Aortic dissection in his brother; Early death in his brother; Edema in his mother.    Social History:    Social History  Socioeconomic History    Marital status: Married   Tobacco Use    Smoking status: Never    Smokeless tobacco: Never   Vaping Use    Vaping Use: Never used   Substance and Sexual Activity    Alcohol use: Not Currently    Drug use: Not Currently    Sexual activity: Not Currently     Partners: Female     Birth control/protection: Surgical, Bilateral Tubal Ligation     Comment: wife   Other Topics Concern    Exercise Yes    Living Situation Yes     Comment: Married     Social Determinants of Health     Financial Resource Strain: Low Risk     Difficulty of Paying Living Expenses: Not hard at all   Food Insecurity: No Food Insecurity    Worried About Programme researcher, broadcasting/film/video in the Last Year: Never true    Barista in the Last Year: Never true Transportation Needs: No Transportation Needs    Lack of Transportation (Medical): No    Lack of Transportation (Non-Medical): No       Allergies:  Allergies   Allergen Reactions    Venom-Honey Bee Swelling       Medications:    Current Outpatient Medications:     atorvastatin (LIPITOR) 10 MG tablet, Take 1 tablet (10 mg total) by mouth nightly., Disp: , Rfl:     furosemide (LASIX) 20 MG tablet, Take 3 tablets (60 mg total) by mouth daily., Disp: 90 tablet, Rfl: 2    gabapentin (NEURONTIN) 300 MG capsule, Take 1 capsule (300 mg total) by mouth at bedtime., Disp: 30 capsule, Rfl: 5    lactulose 10 gram/15 mL solution, Take 30 mL (20 g total) by mouth Three (3) times a day., Disp: 2700 mL, Rfl: 10    lisinopriL (PRINIVIL,ZESTRIL) 2.5 MG tablet, Take 1 tablet (2.5 mg total) by mouth daily., Disp: , Rfl:     pantoprazole (PROTONIX) 40 MG tablet, Take 1 tablet (40 mg total) by mouth daily., Disp: 30 tablet, Rfl: 0    spironolactone (ALDACTONE) 50 MG tablet, Take 2 tablets (100 mg total) by mouth daily., Disp: 60 tablet, Rfl: 5    zinc sulfate 110 mg (25 mg elemental zinc) Tab, Take 1 tablet (110 mg total) by mouth in the morning., Disp: 30 tablet, Rfl: 11    acetaminophen (TYLENOL) 325 MG tablet, Take 2 tablets (650 mg total) by mouth every six (6) hours as needed., Disp: , Rfl: 0    ciprofloxacin HCl (CIPRO) 500 MG tablet, TAKE 1 TABLET BY MOUTH IN THE MORNING, Disp: 30 tablet, Rfl: 0    hydrOXYzine (VISTARIL) 50 MG capsule, Take 1 capsule (50 mg total) by mouth nightly as needed., Disp: , Rfl:     insulin syringe-needle U-100 1 mL 31 gauge x 5/16 (8 mm) Syrg, Use once as needed for Hydrocortisone injection.., Disp: 10 each, Rfl: 0    ondansetron (ZOFRAN-ODT) 4 MG disintegrating tablet, TAKE 1 TABLET BY MOUTH EVERY 6 HOURS FOR 7 DAYS, Disp: , Rfl:     ondansetron (ZOFRAN-ODT) 4 MG disintegrating tablet, Take 1 tablet (4 mg total) by mouth every eight (8) hours as needed for nausea., Disp: 72 tablet, Rfl: 11    pen needle, diabetic (BD ULTRA-FINE NANO PEN NEEDLE) 32 gauge x 5/32 (4 mm) Ndle, ok to sub any brand or size needle preferred by insurance/patient, use 1-2x/day, dx E11.65, Disp: 100 each, Rfl: 12  rifAXIMin (XIFAXAN) 550 mg Tab, Take 1 tablet (550 mg total) by mouth Two (2) times a day. (Patient not taking: Reported on 01/17/2022), Disp: 60 tablet, Rfl: 5    semaglutide (OZEMPIC) 1 mg/dose (4 mg/3 mL) PnIj injection, Inject 1 mg under the skin every seven (7) days., Disp: 3 mL, Rfl: 3    sodium chloride (AYR) 0.65 % Drop, 1 spray. (Patient not taking: Reported on 01/03/2022), Disp: , Rfl:     syringe with needle 3 mL 23 x 1 Syrg, Use as needed for Hydrocortisone injection, Disp: 1 each, Rfl: 0    TRESIBA FLEXTOUCH U-100 100 unit/mL (3 mL) InPn, Inject 0.3 mL (30 Units total) under the skin at bedtime. Adjust as instructed., Disp: 15 mL, Rfl: 0     Objective:     Physical Exam    Vital Signs:   Vitals:    01/17/22 1323   BP: 104/51   Pulse: 95   Resp: 18   Temp: 36.8 ??C (98.2 ??F)   SpO2: 100%     General: Well developed, well nourished male in no acute distress.  HEENT: Normocephalic, atraumatic, anicteric sclera.   Pulmonary: Normal work of breathing on room air.  Neurologic: Alert and oriented x 3. No obvious focal deficits.  Psych: Appropriate affect.    Pertinent Laboratory Values:   Lab Results   Component Value Date    WBC 6.0 01/14/2022    HGB 10.3 (L) 01/14/2022    HCT 29.7 (L) 01/14/2022    PLT 62 (L) 01/14/2022       Lab Results   Component Value Date    NA 134 (L) 01/14/2022    K 3.8 01/14/2022    CL 101 01/14/2022    CO2 27.0 01/14/2022    BUN 19 01/14/2022    CREATININE 1.58 (H) 01/14/2022    GLU 108 01/14/2022    CALCIUM 8.7 01/14/2022    MG 1.5 (L) 01/14/2022    PHOS 3.0 01/14/2022       Lab Results   Component Value Date    BILITOT 3.7 (H) 01/14/2022    BILIDIR 1.40 (H) 06/24/2021    PROT 6.3 01/14/2022    ALBUMIN 2.7 (L) 01/14/2022    ALT 24 01/14/2022    AST 48 (H) 01/14/2022    ALKPHOS 149 (H) 01/14/2022    GGT 19 01/14/2022       Lab Results   Component Value Date    LABPROT 15.3 (H) 01/08/2022    INR 1.84 01/14/2022    APTT 41.9 (H) 01/14/2022       Imaging Studies: CT A/P 08/25/2021 images personally reviewed and interpreted. There is a large caliber splenorenal shunt originating from the left renal vein. No gastric varices. PV patent.    Assessment:   Anvay Tennis is a 61 y.o. male who presents with recurrent HE and a large splenorenal shunt.    Plan (Medical Decision Making):   I discussed the various treatment options for HE. Given the nature of the patient's disease, we feel that the he would be best treated with embolization of the splenorenal shunt at this time. The risks, benefits and alternatives were fully discussed including bleeding, infection, damage to adjacent structures/organs, worsening ascites, and worsening varices. He wishes to proceed. The procedure will be scheduled at the earliest mutually agreeable and available date.    Georgian Co, MD, PhD  Assistant Professor of Radiology  [employer]

## 2022-01-18 NOTE — Unmapped (Signed)
Pt paged on call coordinator, TNC returned call. Pt concerned that his hands are shaking, I just woke up and its 230p. Pt states his wife is  in [place] from 6/1-6/25 for her dads [employer]. Pt is home alone but says he will ask neighbor to check in on him daily. Neighbor often able to drive pt for local needs.     States son is at work. Pt concerned because he hands are shaking and he was unaware of the time of day. Reports taking lactulose as prescribed, but has not had BM since yesterday. When further asked pt likely only took med 1-2x yesterday and shared when he went to the bathroom last night he lost his balance and fell. Denies any injuries but states he does get light headed when he stands quickly. Pt has been checking his BP daily. States yesterday BP was WNL for him (diastolic 50s). Speech is slow, delayed thought process. Pt tearful at times discussing stressors and losses he has faced over this past year.   He identified quality of life has been poor, he's tired, but hopeful after meeting with IR re: shunt embo.  After providing support, discussed plan for pt to take lactulose q1-2 h until he has BM and resume dosing for goal of 3-5 BMs daily. Pt aware on-call coordinator will check in on him this evening (knows to page on call coordinator if needed beforehand for worsening symptoms or any concerns).

## 2022-01-19 NOTE — Unmapped (Signed)
Called pt to check in per primary TNC's request. Pt reported that he is better, still have shakes, but took a 4 doses of lactulose and had BMs x3. Pt stated that his son is coming to stay with him tonight. Pt added that he fell in the bathroom last night - no injuries. Provided emotional support, encouraged to eat as pt only had a piece of bread today.

## 2022-01-19 NOTE — Unmapped (Signed)
Insurance has been verified  ??  ??Patient??now??has active coverage with??[employer] Adv.??  ??  Authorization [id number] Liver transplant??listing valid now 01/10/22 to 01/09/23  ??  ??Patient is financially cleared for Liver transplant??listing

## 2022-01-19 NOTE — Unmapped (Signed)
Returned pt page - pt asking when he stop taking lactulose as he has had ~20 BMs since yesterday. Advised pt to not take any more doses today as Rx dosing for goal of 3-5 BMs daily. Advised pt to resume lactulose tomorrow. Pt verbalized understanding, reported that his shakiness is finally gone.

## 2022-01-20 NOTE — Unmapped (Signed)
Received on call page - hands shaking, legs hurting. Pt had taken lactulose this morning and already had BMx1. Pt stated that he is unable to get out of bed d/t severe pain. Advised pt to call his son as he may need to be evaluated at closest ED. Pt verbalized understanding.

## 2022-01-22 LAB — COMPREHENSIVE METABOLIC PANEL
A/G RATIO: 0.9 — ABNORMAL LOW (ref 1.2–2.2)
ALBUMIN: 3 g/dL — ABNORMAL LOW (ref 3.8–4.8)
ALKALINE PHOSPHATASE: 236 IU/L — ABNORMAL HIGH (ref 44–121)
ALT (SGPT): 36 IU/L (ref 0–44)
AST (SGOT): 57 IU/L — ABNORMAL HIGH (ref 0–40)
BILIRUBIN TOTAL (MG/DL) IN SER/PLAS: 2.4 mg/dL — ABNORMAL HIGH (ref 0.0–1.2)
BLOOD UREA NITROGEN: 20 mg/dL (ref 8–27)
BUN / CREAT RATIO: 12 (ref 10–24)
CALCIUM: 9.3 mg/dL (ref 8.6–10.2)
CHLORIDE: 95 mmol/L — ABNORMAL LOW (ref 96–106)
CO2: 23 mmol/L (ref 20–29)
CREATININE: 1.71 mg/dL — ABNORMAL HIGH (ref 0.76–1.27)
GLOBULIN, TOTAL: 3.5 g/dL (ref 1.5–4.5)
GLUCOSE: 277 mg/dL — ABNORMAL HIGH (ref 70–99)
POTASSIUM: 5.3 mmol/L — ABNORMAL HIGH (ref 3.5–5.2)
SODIUM: 130 mmol/L — ABNORMAL LOW (ref 134–144)
TOTAL PROTEIN: 6.5 g/dL (ref 6.0–8.5)

## 2022-01-22 LAB — PROTIME-INR
INR: 1.4 — ABNORMAL HIGH (ref 0.9–1.2)
PROTHROMBIN TIME: 14.1 s — ABNORMAL HIGH (ref 9.1–12.0)

## 2022-01-23 NOTE — Unmapped (Signed)
error 

## 2022-01-24 NOTE — Unmapped (Signed)
Call placed to check in on pt status. Pt reports improvement in symptoms but is very tired.  Reports Tues evening he slept 14 hrs straight (2300-1330).  Pt has been taking lactulose with 3 BMs/day x2 days. Pt states his hands are still shaking a little but much improved.  His biggest complaint is back pain. States he has arthritis in his lower back, has been using heating pad, up to 2g Tylenol and states lido patches are ineffective.  Pt has appt with [name] on Monday and encouraged pt to discuss with MD.  Pt wife is out of town until 6/25, but pt's wife, son and neighbor have been checking in on him daily he says. Pt has secured transport to appt on Monday. Note routed to hepatologist for awareness.

## 2022-01-28 ENCOUNTER — Ambulatory Visit: Admit: 2022-01-28 | Discharge: 2022-01-31 | Payer: MEDICARE

## 2022-01-28 ENCOUNTER — Ambulatory Visit
Admit: 2022-01-28 | Discharge: 2022-01-31 | Disposition: A | Discharge status: Home Health | Payer: MEDICARE | Source: Ambulatory Visit

## 2022-01-28 ENCOUNTER — Ambulatory Visit: Admit: 2022-01-28 | Discharge: 2022-01-29 | Payer: MEDICARE

## 2022-01-28 DIAGNOSIS — I959 Hypotension, unspecified: Principal | ICD-10-CM

## 2022-01-28 LAB — COMPREHENSIVE METABOLIC PANEL
ALBUMIN: 2.5 g/dL — ABNORMAL LOW (ref 3.4–5.0)
ALKALINE PHOSPHATASE: 310 U/L — ABNORMAL HIGH (ref 46–116)
ALT (SGPT): 44 U/L (ref 10–49)
ANION GAP: 13 mmol/L (ref 5–14)
AST (SGOT): 76 U/L — ABNORMAL HIGH (ref <=34)
BILIRUBIN TOTAL: 3 mg/dL — ABNORMAL HIGH (ref 0.3–1.2)
BLOOD UREA NITROGEN: 27 mg/dL — ABNORMAL HIGH (ref 9–23)
BUN / CREAT RATIO: 10
CALCIUM: 7.4 mg/dL — ABNORMAL LOW (ref 8.7–10.4)
CHLORIDE: 94 mmol/L — ABNORMAL LOW (ref 98–107)
CO2: 17 mmol/L — ABNORMAL LOW (ref 20.0–31.0)
CREATININE: 2.61 mg/dL — ABNORMAL HIGH
EGFR CKD-EPI (2021) MALE: 27 mL/min/{1.73_m2} — ABNORMAL LOW (ref >=60)
GLUCOSE RANDOM: 163 mg/dL (ref 70–179)
POTASSIUM: 5.6 mmol/L — ABNORMAL HIGH (ref 3.4–4.8)
PROTEIN TOTAL: 5.7 g/dL (ref 5.7–8.2)
SODIUM: 124 mmol/L — ABNORMAL LOW (ref 135–145)

## 2022-01-28 LAB — PROTIME-INR
INR: 1.57
PROTIME: 18.1 s — ABNORMAL HIGH (ref 9.8–12.8)

## 2022-01-28 LAB — URINALYSIS WITH MICROSCOPY WITH CULTURE REFLEX
BILIRUBIN UA: NEGATIVE
BLOOD UA: NEGATIVE
GLUCOSE UA: NEGATIVE
HYALINE CASTS: 17 /LPF — ABNORMAL HIGH (ref 0–1)
KETONES UA: NEGATIVE
LEUKOCYTE ESTERASE UA: NEGATIVE
NITRITE UA: NEGATIVE
PH UA: 5 (ref 5.0–9.0)
PROTEIN UA: NEGATIVE
RBC UA: <1 /HPF (ref <=3)
SPECIFIC GRAVITY UA: 1.015 (ref 1.003–1.030)
SQUAMOUS EPITHELIAL: 1 /HPF (ref 0–5)
UROBILINOGEN UA: <2
WBC UA: 1 /HPF (ref <=2)

## 2022-01-28 LAB — CBC W/ AUTO DIFF
BASOPHILS ABSOLUTE COUNT: 0 10*9/L (ref 0.0–0.1)
BASOPHILS RELATIVE PERCENT: 0.4 %
EOSINOPHILS ABSOLUTE COUNT: 0.1 10*9/L (ref 0.0–0.5)
EOSINOPHILS RELATIVE PERCENT: 1.9 %
HEMATOCRIT: 28.1 % — ABNORMAL LOW (ref 39.0–48.0)
HEMOGLOBIN: 10 g/dL — ABNORMAL LOW (ref 12.9–16.5)
LYMPHOCYTES ABSOLUTE COUNT: 0.9 10*9/L — ABNORMAL LOW (ref 1.1–3.6)
LYMPHOCYTES RELATIVE PERCENT: 11.8 %
MEAN CORPUSCULAR HEMOGLOBIN CONC: 35.7 g/dL (ref 32.0–36.0)
MEAN CORPUSCULAR HEMOGLOBIN: 35.8 pg — ABNORMAL HIGH (ref 25.9–32.4)
MEAN CORPUSCULAR VOLUME: 100.4 fL — ABNORMAL HIGH (ref 77.6–95.7)
MEAN PLATELET VOLUME: 12.2 fL — ABNORMAL HIGH (ref 6.8–10.7)
MONOCYTES ABSOLUTE COUNT: 1.7 10*9/L — ABNORMAL HIGH (ref 0.3–0.8)
MONOCYTES RELATIVE PERCENT: 22.1 %
NEUTROPHILS ABSOLUTE COUNT: 5 10*9/L (ref 1.8–7.8)
NEUTROPHILS RELATIVE PERCENT: 63.8 %
PLATELET COUNT: 71 10*9/L — ABNORMAL LOW (ref 150–450)
RED BLOOD CELL COUNT: 2.8 10*12/L — ABNORMAL LOW (ref 4.26–5.60)
RED CELL DISTRIBUTION WIDTH: 15.9 % — ABNORMAL HIGH (ref 12.2–15.2)
WBC ADJUSTED: 7.8 10*9/L (ref 3.6–11.2)

## 2022-01-28 LAB — AMMONIA: AMMONIA: 42 umol/L — ABNORMAL HIGH (ref 11–32)

## 2022-01-28 LAB — HIGH SENSITIVITY TROPONIN I - SINGLE: HIGH SENSITIVITY TROPONIN I: 4 ng/L (ref <=53)

## 2022-01-28 LAB — APTT
APTT: 32.8 s (ref 25.1–36.5)
HEPARIN CORRELATION: 0.2

## 2022-01-28 LAB — LACTATE, VENOUS, WHOLE BLOOD: LACTATE BLOOD VENOUS: 2.8 mmol/L — ABNORMAL HIGH (ref 0.5–1.8)

## 2022-01-28 MED ADMIN — sodium chloride 0.9% (NS) bolus 500 mL: 500 mL | INTRAVENOUS | @ 22:00:00 | Stop: 2022-01-28

## 2022-01-28 MED ADMIN — sodium chloride 0.9% (NS) bolus 1,000 mL: 1000 mL | INTRAVENOUS | @ 19:00:00 | Stop: 2022-01-28

## 2022-01-28 NOTE — Unmapped (Signed)
[facility] Emergency Department Provider Note      ED Clinical Impression     Final diagnoses:   AKI (acute kidney injury) (CMS-HCC) (Primary)   Hyponatremia   Hyperkalemia       HPI, ED Course, Assessment and Plan     Initial Clinical Impression:    January 28, 2022 3:11 PM   Matthew Key is a 61 y.o. male BIB EMS with past medical history of decompensated hepatic cirrhosis (currently awaiting a liver transplant), stroke, T2DM, DVT, OSA (on CPAP), GERD, hypertension, hyperlipidemia, and chronic low back pain presenting with hypotension. The patient reports feeling lightheaded and short of breath this morning with proceeding onset of vision changes in the setting of hypotension with systolic pressure of 65 at hypotension clinic this morning. He also endorses abdominal pain in the setting of known history of spontaneous bacterial peritonitis that began while en route to the ED, but notes that it has somewhat improved at this time. Additionally, he notes 10 pounds of weight gain recently. EMS was called due to his hypotension. While en route to the ED, the patient's BP was 75/37. EMS began fluids while en route to the ED. EMS notes that he was mentating appropriately. He took his blood pressure medications around 8 AM this morning. He takes a fluid pill daily. He also takes Lactulose daily to assist with normal bowel movements. He typically has about 5 bowel movements per day. He has been staying hydrated with water and Pedialyte recently. He denies any recent falls. He denies chest pain, vomiting, fevers, hematochezia, or urinary symptoms.     BP 95/38  - Pulse 88  - Resp 18     Medical Decision Making  Amount and/or Complexity of Data Reviewed  External Data Reviewed: notes.     Details: Hypotension clinic progress notes from today discussing the patient's hypotension this afternoon.      Matthew Key is a 61 y.o. 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     On arrival, the patient is hypotensive to 95/38. Otherwise, his triage vitals are within normal limits. Upon entering the room, the patient is well-appearing and in NAD. On exam, his extremities are warm and well-perfused. Good distal pulses. Abdomen is soft, but mildly distended with mild TTP in the LLQ. No rebound or guarding. He has 1+ pitting edema bilaterally to the ankles. Differential diagnosis includes hepatorenal syndrome, hepatic encephalopathy, infectious etiology for possible sepsis, adrenal insufficiency. Will obtain basic labs, blood cultures, lactate, PT-INR, PTT, UA with culture, EKG, and CXR.    Further ED updates and updates to plan as per ED Course below:    ED Course:  ED Course as of 01/28/22 2352   Mon Jan 28, 2022   1717 CBC without leukocytosis.  Hgb with anemia of 10 which seems to be about patient's baseline.  INR is 1.57   1908 CMP with hyponatremia with sodium of 124 down from 130 1 week ago.  Patient also with hyperkalemia to 5.6.  On EKG, normal sinus rhythm at a rate of 82 with PR interval prolongation to 194 prolonged from prior out 178.  QTc at 476.  EKG initially read possible ST elevations in inferior leads however on review EKG inconsistent with STEMI.  Mild changes compared to prior but  overall reassuring.  Initial troponin is negative at 4.  Patient not having any chest pain.  Lactate is elevated 2.8.  In addition to 1 L given by EMS will give another 500 cc.  UA without any signs of infection or hematuria.   1911 XR Chest Portable  IMPRESSION:  Bibasilar subsegmental atelectasis.   Mild blunting of the left costophrenic angle which may reflect small pleural effusion.    No focal opacities and patient without any recent cough or congestion with low suspicion for pneumonia.     1912 Bedside ultrasound performed without significant ascites in abdomen and no tappable pocket.  Patient states this does not feel similar to SBP as pain is more localized in the left lower quadrant when prior episodes of been more generalized throughout the abdomen.  Given patient has minimal ascites and abdominal exam is overall reassuring, low suspicion for SBP at this time.  Will hold on empiric antibiotics. Blood pressure is mildly proved after fluids to 90s/40s.  In the setting of elevated lactate and significant electrolyte abnormalities with AKI with an elevated creatinine of 2.61.  Will page [facility] for further inpatient management.       Social Determinants of Health with Concerns     Internet Connectivity: Not on file   Alcohol Use: Not on file   Substance Use: Not on file   Health Literacy: Not on file   Physical Activity: Not on file   Interpersonal Safety: Not on file   Stress: Not on file   Intimate Partner Violence: Not on file   Depression: Not on file   Social Connections: Not on file     _____________________________________________________________________    The case was discussed with the attending physician who is in agreement with the above assessment and plan    Additional Medical Decision Making     I have reviewed the vital signs and the nursing notes. Labs and radiology results that were available during my care of the patient were independently reviewed by me and considered in my medical decision making.   I independently visualized the EKG tracing if performed  I independently visualized the radiology images if performed  I reviewed the patient's prior medical records if available.  Additional history obtained from family if available.  For specific reads/information impacting care please refer to MDM/ED Course continued documentation    Past History     PAST MEDICAL HISTORY/PAST SURGICAL HISTORY:   Past Medical History:   Diagnosis Date    AKI (acute kidney injury) (CMS-HCC) 12/14/2020    Anxiety 10/22/2013    Arthritis     Cervical radiculopathy 12/03/2016    Chronic pain disorder     Lower back    Cirrhosis (CMS-HCC)     Dental abscess 10/2020    Duodenal ulcer 12/01/2017    GERD (gastroesophageal reflux disease)     History of transfusion     Hyperlipidemia 10/22/2013    Hypertension     under control with meds and weight loss    Liver disease     Sleep apnea, obstructive     Have machine    Stroke (CMS-HCC)     mild stroke    Type 2 diabetes mellitus with diabetic neuropathy, with long-term current use of insulin (CMS-HCC) 06/09/2014       Past Surgical History:   Procedure Laterality Date    KNEE SURGERY      PR CATH PLACE/CORON ANGIO, IMG SUPER/INTERP,R&L HRT CATH, L HRT VENTRIC N/A 12/19/2020  Procedure: CATH LEFT/RIGHT HEART CATHETERIZATION;  Surgeon: Rosana Hoes, MD;  Location: [facility] CATH;  Service: Cardiology    PR COLONOSCOPY FLX DX W/COLLJ SPEC WHEN PFRMD N/A 12/07/2021    Procedure: COLONOSCOPY, FLEXIBLE, PROXIMAL TO SPLENIC FLEXURE; DIAGNOSTIC, W/WO COLLECTION SPECIMEN BY BRUSH OR WASH;  Surgeon: Annie Paras, MD;  [facility];  Service: Gastroenterology    PR UPPER GI ENDOSCOPY,BIOPSY N/A 10/12/2020    Procedure: UGI ENDOSCOPY; WITH BIOPSY, SINGLE OR MULTIPLE;  Surgeon: Marene Lenz, MD;  [facility];  Service: Gastroenterology    PR UPPER GI ENDOSCOPY,DIAGNOSIS N/A 01/03/2022    Procedure: UGI ENDO, INCLUDE ESOPHAGUS, STOMACH, & DUODENUM &/OR JEJUNUM; DX W/WO COLLECTION SPECIMN, BY BRUSH OR WASH;  Surgeon: Marene Lenz, MD;  [facility];  Service: Gastroenterology    PR UPPER GI ENDOSCOPY,LIGAT VARIX N/A 12/07/2021    Procedure: UGI ENDO; Everlene Balls LIG ESOPH &/OR GASTRIC VARICES;  Surgeon: Annie Paras, MD;  [facility]: [facility];  Service: Gastroenterology    ROOT CANAL      Front teeth       MEDICATIONS:     Current Facility-Administered Medications: sodium chloride 0.9% (NS) bolus 1,000 mL, 1,000 mL, Intravenous, Once, Marene Lenz, MD, Last Rate: 500 mL/hr at 01/28/22 1433, 1,000 mL at 01/28/22 1433    Current Outpatient Medications:     acetaminophen (TYLENOL) 325 MG tablet, Take 2 tablets (650 mg total) by mouth every six (6) hours as needed., Disp: , Rfl: 0    atorvastatin (LIPITOR) 10 MG tablet, Take 1 tablet (10 mg total) by mouth nightly., Disp: , Rfl:     ciprofloxacin HCl (CIPRO) 500 MG tablet, TAKE 1 TABLET BY MOUTH IN THE MORNING, Disp: 30 tablet, Rfl: 0    furosemide (LASIX) 20 MG tablet, Take 3 tablets (60 mg total) by mouth daily., Disp: 90 tablet, Rfl: 2    gabapentin (NEURONTIN) 300 MG capsule, Take 1 capsule (300 mg total) by mouth at bedtime., Disp: 30 capsule, Rfl: 5    hydrOXYzine (VISTARIL) 50 MG capsule, Take 1 capsule (50 mg total) by mouth nightly as needed., Disp: , Rfl:     insulin syringe-needle U-100 1 mL 31 gauge x 5/16 (8 mm) Syrg, Use once as needed for Hydrocortisone injection.., Disp: 10 each, Rfl: 0    lactulose 10 gram/15 mL solution, Take 30 mL (20 g total) by mouth Three (3) times a day., Disp: 2700 mL, Rfl: 10    lisinopriL (PRINIVIL,ZESTRIL) 2.5 MG tablet, Take 1 tablet (2.5 mg total) by mouth daily., Disp: , Rfl:     ondansetron (ZOFRAN-ODT) 4 MG disintegrating tablet, TAKE 1 TABLET BY MOUTH EVERY 6 HOURS FOR 7 DAYS, Disp: , Rfl:     ondansetron (ZOFRAN-ODT) 4 MG disintegrating tablet, Take 1 tablet (4 mg total) by mouth every eight (8) hours as needed for nausea., Disp: 72 tablet, Rfl: 11    pantoprazole (PROTONIX) 40 MG tablet, Take 1 tablet (40 mg total) by mouth daily., Disp: 30 tablet, Rfl: 0    pen needle, diabetic (BD ULTRA-FINE NANO PEN NEEDLE) 32 gauge x 5/32 (4 mm) Ndle, ok to sub any brand or size needle preferred by insurance/patient, use 1-2x/day, dx E11.65, Disp: 100 each, Rfl: 12    rifAXIMin (XIFAXAN) 550 mg Tab, Take 1 tablet (550 mg total) by mouth Two (2) times a day., Disp: 60 tablet, Rfl: 5 semaglutide (OZEMPIC) 1 mg/dose (4 mg/3 mL) PnIj  injection, Inject 1 mg under the skin every seven (7) days., Disp: 3 mL, Rfl: 3    sodium chloride (AYR) 0.65 % Drop, 1 spray., Disp: , Rfl:     spironolactone (ALDACTONE) 50 MG tablet, Take 2 tablets (100 mg total) by mouth daily., Disp: 60 tablet, Rfl: 5    syringe with needle 3 mL 23 x 1 Syrg, Use as needed for Hydrocortisone injection, Disp: 1 each, Rfl: 0    TRESIBA FLEXTOUCH U-100 100 unit/mL (3 mL) InPn, Inject 0.3 mL (30 Units total) under the skin at bedtime. Adjust as instructed., Disp: 15 mL, Rfl: 0    zinc sulfate 110 mg (25 mg elemental zinc) Tab, Take 1 tablet (110 mg total) by mouth in the morning., Disp: 30 tablet, Rfl: 11    ALLERGIES:   Venom-honey bee    SOCIAL HISTORY:   Social History     Tobacco Use    Smoking status: Never    Smokeless tobacco: Never   Substance Use Topics    Alcohol use: Not Currently       FAMILY HISTORY:  Family History   Problem Relation Age of Onset    Edema Mother     Alzheimer's disease Father     Aortic dissection Brother     Early death Brother     Aneurysm Brother           Review of Systems     A review of systems was performed and relevant portions were as noted above in HPI     Physical Exam     VITAL SIGNS:    There were no vitals taken for this visit.    Constitutional:   Alert and oriented.   Head:   Normocephalic and atraumatic  Eyes:   Conjunctivae are normal, EOMI, PERRL  ENT:   No notable congestion, Mucous membranes moist, External ears normal, no notable stridor  Cardiovascular:   Rate as vitals above. Extremities are warm and well-perfused. Good distal pulses.  Respiratory:   Normal respiratory effort. Breath sounds are normal.  Gastrointestinal:   Abdomen is soft, but mildly distended with TTP in the LUQ and LLQ. No rebound or guarding.  Genitourinary:   Deferred  Musculoskeletal:    Normal range of motion in all extremities. He has 1+ pitting edema bilaterally to the ankles.   Neurologic:   No gross focal neurologic deficits beyond baseline are appreciated.  Skin:   Skin is warm, dry and intact.       Radiology     XR Chest Portable   Preliminary Result      Bibasilar subsegmental atelectasis.       Mild blunting of the left costophrenic angle which may reflect small pleural effusion.          Labs     Labs Reviewed   COMPREHENSIVE METABOLIC PANEL - Abnormal; Notable for the following components:       Result Value    Sodium 124 (*)     Potassium 5.6 (*)     Chloride 94 (*)     CO2 17.0 (*)     BUN 27 (*)     Creatinine 2.61 (*)     eGFR CKD-EPI (2021) Male 77 (*)     Calcium 7.4 (*)     Albumin 2.5 (*)     Total Bilirubin 3.0 (*)     AST 76 (*)     Alkaline Phosphatase 310 (*)     All  other components within normal limits   PROTIME-INR - Abnormal; Notable for the following components:    PT 18.1 (*)     All other components within normal limits   LACTATE, VENOUS, WHOLE BLOOD - Abnormal; Notable for the following components:    Lactate, Venous 2.8 (*)     All other components within normal limits   URINALYSIS WITH MICROSCOPY WITH CULTURE REFLEX - Abnormal; Notable for the following components:    Bacteria, UA Rare (*)     WBC Clumps Rare (*)     Hyaline Casts, UA 17 (*)     All other components within normal limits   CBC W/ AUTO DIFF - Abnormal; Notable for the following components:    RBC 2.80 (*)     HGB 10.0 (*)     HCT 28.1 (*)     MCV 100.4 (*)     MCH 35.8 (*)     RDW 15.9 (*)     MPV 12.2 (*)     Platelet 71 (*)     Absolute Lymphocytes 0.9 (*)     Absolute Monocytes 1.7 (*)     Macrocytosis Slight (*)     All other components within normal limits   HIGH SENSITIVITY TROPONIN I - SINGLE - Normal   BLOOD CULTURE   BLOOD CULTURE   URINE CULTURE   CBC W/ DIFFERENTIAL    Narrative:     The following orders were created for panel order CBC w/ Differential.  Procedure                               Abnormality         Status                     ---------                               -----------         ------ CBC w/ Differential[[id number]]         Abnormal            Final result                 Please view results for these tests on the individual orders.   APTT         Pertinent labs & imaging results that were available during my care of the patient were reviewed by me and considered in my medical decision making (see chart for details).    Please note- This chart has been created using [employer]. Chart creation errors have been sought, but may not always be located and such creation errors, especially pronoun confusion, do NOT reflect on the standard of medical care.    Documentation assistance was provided by Johnathan Hausen, Scribe on January 28, 2022 at 3:19 PM for Magda Bernheim, MD.    Documentation assistance provided by the above mentioned scribe. I was present during the time the encounter was recorded. The information recorded by the scribe was done at my direction and has been reviewed and validated by me.   Magda Bernheim, MD        Magda Bernheim, MD  Resident  01/28/22 [phone]

## 2022-01-28 NOTE — Unmapped (Signed)
Matthew Key    REFERRING PROVIDER: Card, [name], MD  PRIMARY CARE PROVIDER: Loman Brooklyn, MD    DATE OF SERVICE: 01/28/2022    Subjective   SUBJECTIVE:     CHIEF COMPLAINT: follow-up for routine cirrhosis care    HISTORY OF PRESENT ILLNESS:      Matthew Key is a 61 y.o. 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 His cirrhosis has been complicated by ascites, hepatic encephalopathy and non-bleeding esophageal varices. Patient???s prior issues have included hyponatremia, adrenal insufficiency, SBP and volume overload. He is now actively listed for LT.     INTERVAL HISTORY: (since the time of the patient's last visit): Last seen 10/01/2021  Patient presented hypotensive with slowed speech and poor attention c/w hepatic encephalopathy. We started IV fluids and will send to emergency room for likely admission.    Wt Readings from Last 6 Encounters:   01/14/22 89.8 kg (198 lb)   01/03/22 88.9 kg (196 lb)   12/31/21 90.4 kg (199 lb 6.4 oz)   12/11/21 90 kg (198 lb 7.1 oz)   12/07/21 89.8 kg (198 lb)   11/26/21 92.1 kg (203 lb)     REVIEW OF SYSTEMS:   The balance of 12 systems reviewed is negative except as noted in the HPI.     PAST MEDICAL HISTORY:  Past Medical History:   Diagnosis Date    AKI (acute kidney injury) (CMS-HCC) 12/14/2020    Anxiety 10/22/2013    Arthritis     Cervical radiculopathy 12/03/2016    Chronic pain disorder     Lower back    Cirrhosis (CMS-HCC)     Dental abscess 10/2020    Duodenal ulcer 12/01/2017    GERD (gastroesophageal reflux disease)     History of transfusion     Hyperlipidemia 10/22/2013    Hypertension     under control with meds and weight loss    Liver disease     Sleep apnea, obstructive     Have machine    Stroke (CMS-HCC)     mild stroke    Type 2 diabetes mellitus with diabetic neuropathy, with long-term current use of insulin (CMS-HCC) 06/09/2014       MEDICATIONS:   Current Outpatient Medications   Medication Sig Dispense Refill    acetaminophen (TYLENOL) 325 MG tablet Take 2 tablets (650 mg total) by mouth every six (6) hours as needed.  0    atorvastatin (LIPITOR) 10 MG tablet Take 1 tablet (10 mg total) by mouth nightly.      ciprofloxacin HCl (CIPRO) 500 MG tablet TAKE 1 TABLET BY MOUTH IN THE MORNING 30 tablet 0    furosemide (LASIX) 20 MG tablet Take 3 tablets (60 mg total) by mouth daily. 90 tablet 2    gabapentin (NEURONTIN) 300 MG capsule Take 1 capsule (300 mg total) by mouth at bedtime. 30 capsule 5    hydrOXYzine (VISTARIL) 50 MG capsule Take 1 capsule (50 mg total) by mouth nightly as needed.      insulin syringe-needle U-100 1 mL 31 gauge x 5/16 (8 mm) Syrg Use once as needed for Hydrocortisone injection.. 10 each 0    lactulose 10 gram/15 mL solution Take 30 mL (20 g total) by mouth Three (3) times a day. 2700 mL 10    lisinopriL (  PRINIVIL,ZESTRIL) 2.5 MG tablet Take 1 tablet (2.5 mg total) by mouth daily.      ondansetron (ZOFRAN-ODT) 4 MG disintegrating tablet TAKE 1 TABLET BY MOUTH EVERY 6 HOURS FOR 7 DAYS      ondansetron (ZOFRAN-ODT) 4 MG disintegrating tablet Take 1 tablet (4 mg total) by mouth every eight (8) hours as needed for nausea. 72 tablet 11    pantoprazole (PROTONIX) 40 MG tablet Take 1 tablet (40 mg total) by mouth daily. 30 tablet 0    pen needle, diabetic (BD ULTRA-FINE NANO PEN NEEDLE) 32 gauge x 5/32 (4 mm) Ndle ok to sub any brand or size needle preferred by insurance/patient, use 1-2x/day, dx E11.65 100 each 12    rifAXIMin (XIFAXAN) 550 mg Tab Take 1 tablet (550 mg total) by mouth Two (2) times a day. (Patient not taking: Reported on 01/17/2022) 60 tablet 5    semaglutide (OZEMPIC) 1 mg/dose (4 mg/3 mL) PnIj injection Inject 1 mg under the skin every seven (7) days. 3 mL 3    sodium chloride (AYR) 0.65 % Drop 1 spray. (Patient not taking: Reported on 01/03/2022) spironolactone (ALDACTONE) 50 MG tablet Take 2 tablets (100 mg total) by mouth daily. 60 tablet 5    syringe with needle 3 mL 23 x 1 Syrg Use as needed for Hydrocortisone injection 1 each 0    TRESIBA FLEXTOUCH U-100 100 unit/mL (3 mL) InPn Inject 0.3 mL (30 Units total) under the skin at bedtime. Adjust as instructed. 15 mL 0    zinc sulfate 110 mg (25 mg elemental zinc) Tab Take 1 tablet (110 mg total) by mouth in the morning. 30 tablet 11     No current facility-administered medications for this visit.       ALLERGIES:   Venom-honey bee       Objective   OBJECTIVE:   VITAL SIGNS: BP 75/34  - Pulse 84  - Temp 36.6 ??C (97.8 ??F) (Tympanic)  - Resp 18  - Ht 172.7 cm (5' 8)  - SpO2 98%  - BMI 30.11 kg/m??      Wt Readings from Last 3 Encounters:   01/14/22 89.8 kg (198 lb)   01/03/22 88.9 kg (196 lb)   12/31/21 90.4 kg (199 lb 6.4 oz)       PHYSICAL EXAM:  Gen: Chronically ill-appearing male in NAD, slowed speech, oriented but mentation slowed compared to baseline  Abd: slightly-distended, non-tender    DIAGNOSTIC STUDIES:  I have reviewed all pertinent diagnostic studies, including:  Laboratory results:  Lab Results   Component Value Date    WBC 6.0 01/14/2022    HGB 10.3 (L) 01/14/2022    MCV 99.5 (H) 01/14/2022    PLT 62 (L) 01/14/2022    Lab Results   Component Value Date    NA 130 (L) 01/21/2022    K 5.3 (H) 01/21/2022    CL 95 (L) 01/21/2022    CREATININE 1.71 (H) 01/21/2022    GLU 108 01/14/2022    CALCIUM 9.3 01/21/2022      Lab Results   Component Value Date    ALBUMIN 2.7 (L) 01/14/2022    AST 57 (H) 01/21/2022    ALT 36 01/21/2022    ALKPHOS 236 (H) 01/21/2022    BILITOT 2.4 (H) 01/21/2022    Lab Results   Component Value Date    INR 1.4 (H) 01/21/2022        Lab Results   Component Value Date    AFPTM <  2 10/01/2021    AFPTM 3 05/09/2021    AFPTM 1.9 08/16/2020    AFPTM 3 01/24/2020       Lab Results   Component Value Date    IRON 98 05/09/2021    FERRITIN 558.5 (H) 05/09/2021     Lab Results   Component Value Date    CHOL 149 12/13/2019    TRIG 95 12/13/2019    A1C 4.7 (L) 01/14/2022     Lab Results   Component Value Date    VITDTOTAL 27.9 09/11/2020     Lab Results   Component Value Date    HEPAIGG Reactive (A) 05/09/2021    HBSAG Nonreactive 01/14/2022    HEPBSAB Nonreactive 01/14/2022    HEPBCAB Nonreactive 01/14/2022    HEPCAB Nonreactive 01/14/2022    HIV Nonreactive 01/14/2022     Lab Results   Component Value Date    ANA Negative 05/09/2021    SMOOTHMUSCAB Positive (A) 05/09/2021    MITOAB Negative 05/09/2021       MELD-Na: 24 at 01/21/2022 11:28 AM  Calculated from:  Serum Creatinine: 1.71 mg/dL at 0/96/0454 09:81 AM  Serum Sodium: 130 mmol/L at 01/21/2022 11:28 AM  Total Bilirubin: 2.4 mg/dL at 1/91/4782 95:62 AM  INR(ratio): 1.4 at 01/21/2022 11:28 AM      Radiographic studies:  Reviewed    GI Procedures:  12/07/21 EGD  Impression:            - Grade II esophageal varices. Completely eradicated.                          Banded.                         - Portal hypertensive gastropathy.                         - Gastric lipoma.                         - Normal examined duodenum.                         - No specimens collected.    12/07/21 Colonoscopy   Impression:            - The examined portion of the ileum was normal.                         - Diverticulosis in the sigmoid colon.                         - Stool in the rectum and in the sigmoid colon.                         - Friability with contact bleeding in the entire                          examined colon.                         - No specimens collected.    ASSESSMENT AND PLAN:   Matthew Key is a 61 y.o. 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 His cirrhosis has been complicated by ascites, hepatic encephalopathy and non-bleeding esophageal varices. Patient???s prior issues have included hyponatremia, adrenal insufficiency, SBP and volume overload. He is now actively listed for LT. His main issue has been recurrent and refractory hepatic encephalopathy despite lactulose, rifaximin and zinc.    Patient presented today to clinic with recurrent encephalopathy and hypotension (66/31 -> 75/34 after IVFs). We started IVFs, checked BG (wnl) and called EMS to transfer to [facility] ED for evaluation and likely admission. Once admitted it will be important that patient receive labs and diagnostic workup for hypotension including thorough infectious workup (with diagnostic paracentesis if sufficient ascites) and workup for adrenal insufficiency. He will also need ongoing lactulose and rifaximin for his hepatic encephalopathy. The inpatient hepatology service can be consulted once admitted.    Patient has been dealing with recurrent/refractory HE and is currently scheduled for shunt embolization. I revisited the benefits and risks of shunt embolization for recurrent HE given his elevated MELD and Child Pugh. There are some series demonstrating that this can be quite effective for refractory HE although I explained that patients with a MELD score >11 have a higher risk of recurrence of HE and therefore may not elicit as much benefit. Regarding the risks, he will be at higher risk of fluid reaccumulation, but he has had well controlled volume status with relatively low dose diuretics. Additionally, he will be at higher risk of worsening of bleeding from his esophageal varices and rare complications including hemoperitoneum, capsular bleeding and multiorgan failure. Of Key, he???s never bled from his esophageal varices, he???s been undergoing ongoing surveillance with banding. We will plan to perform upper endoscopy shortly after his shunt embolization. I reviewed the available literature that suggests that the risk of bleeding from varices is relatively low (PMID 56213086) but his risk may be higher given that he has a higher MELD score than the patients in this series (mean MELD 13). Based on a larger series, authors have suggested that a Child Pugh score of >11 is associated with a higher risk of post-embolization mortality and that patients with a CTP score above this threshold be excluded from embolization (PMID [id number]). Matthew Key???s current Child Pugh score is 10 although it has fluctuated somewhat. Will continue to monitor closely as his scheduled date for shunt embolization approaches.    I spent a majority of my time today with the patient reviewing historical data, old records (including faxed data and data in Care Everywhere), performing a physical examination, and formulating an assessment and plan together with the patient.    Tawni Carnes, MD, MPH  Assistant Professor of Medicine  Division of Gastroenterology and Hepatology

## 2022-01-28 NOTE — Unmapped (Signed)
Bed: 22-B  Expected date:   Expected time:   Means of arrival:   Comments:  EMS

## 2022-01-28 NOTE — Unmapped (Signed)
BIB [facility] c/o hypotension.

## 2022-01-28 NOTE — Unmapped (Signed)
Situation - Patient here for clinic appt for [name]. Glean Hoyt CMT obtained intake. Notified CN and [name]. Hypotensive 65 systolic. : Fatigue. Patient A/O to self, location and time.     Background: Decompensated liver failure    Action: Moon notified and EMS notified 14:04, Hospital police 14:08 and front desk 14:10.  Hardee RN started PIV #22 1 Litre NS started. BG Brett Canales RN checked Glucose = 152.    Response 14:28 [employer] here. Report given and AVS with VS and BG result. Patient A/O to self location and time. Dr moon told patient that he is notifying the liver team at [facility].

## 2022-01-29 LAB — COMPREHENSIVE METABOLIC PANEL
ALBUMIN: 2.2 g/dL — ABNORMAL LOW (ref 3.4–5.0)
ALKALINE PHOSPHATASE: 257 U/L — ABNORMAL HIGH (ref 46–116)
ALT (SGPT): 39 U/L (ref 10–49)
ANION GAP: 11 mmol/L (ref 5–14)
AST (SGOT): 57 U/L — ABNORMAL HIGH (ref <=34)
BILIRUBIN TOTAL: 2.6 mg/dL — ABNORMAL HIGH (ref 0.3–1.2)
BLOOD UREA NITROGEN: 35 mg/dL — ABNORMAL HIGH (ref 9–23)
BUN / CREAT RATIO: 13
CALCIUM: 7.9 mg/dL — ABNORMAL LOW (ref 8.7–10.4)
CHLORIDE: 100 mmol/L (ref 98–107)
CO2: 18 mmol/L — ABNORMAL LOW (ref 20.0–31.0)
CREATININE: 2.71 mg/dL — ABNORMAL HIGH
EGFR CKD-EPI (2021) MALE: 26 mL/min/{1.73_m2} — ABNORMAL LOW (ref >=60)
GLUCOSE RANDOM: 146 mg/dL (ref 70–179)
POTASSIUM: 5.2 mmol/L — ABNORMAL HIGH (ref 3.4–4.8)
PROTEIN TOTAL: 5.3 g/dL — ABNORMAL LOW (ref 5.7–8.2)
SODIUM: 129 mmol/L — ABNORMAL LOW (ref 135–145)

## 2022-01-29 LAB — CORTISOL
CORTISOL TOTAL: 11.3 ug/dL
CORTISOL TOTAL: 10.2 ug/dL

## 2022-01-29 LAB — CBC
HEMATOCRIT: 27.7 % — ABNORMAL LOW (ref 39.0–48.0)
HEMOGLOBIN: 9.6 g/dL — ABNORMAL LOW (ref 12.9–16.5)
MEAN CORPUSCULAR HEMOGLOBIN CONC: 34.7 g/dL (ref 32.0–36.0)
MEAN CORPUSCULAR HEMOGLOBIN: 35 pg — ABNORMAL HIGH (ref 25.9–32.4)
MEAN CORPUSCULAR VOLUME: 100.9 fL — ABNORMAL HIGH (ref 77.6–95.7)
MEAN PLATELET VOLUME: 11.9 fL — ABNORMAL HIGH (ref 6.8–10.7)
PLATELET COUNT: 63 10*9/L — ABNORMAL LOW (ref 150–450)
RED BLOOD CELL COUNT: 2.74 10*12/L — ABNORMAL LOW (ref 4.26–5.60)
RED CELL DISTRIBUTION WIDTH: 16.1 % — ABNORMAL HIGH (ref 12.2–15.2)
WBC ADJUSTED: 6.7 10*9/L (ref 3.6–11.2)

## 2022-01-29 LAB — IONIZED CALCIUM VENOUS: CALCIUM IONIZED VENOUS (MG/DL): 4.3 mg/dL — ABNORMAL LOW (ref 4.40–5.40)

## 2022-01-29 LAB — PHOSPHORUS: PHOSPHORUS: 5.2 mg/dL — ABNORMAL HIGH (ref 2.4–5.1)

## 2022-01-29 LAB — AMMONIA: AMMONIA: 214 umol/L — ABNORMAL HIGH (ref 11–32)

## 2022-01-29 MED ORDER — PANTOPRAZOLE 40 MG TABLET,DELAYED RELEASE
ORAL_TABLET | 0 refills | 0 days
Start: 2022-01-29 — End: ?

## 2022-01-29 MED ADMIN — heparin (porcine) 5,000 unit/mL injection 5,000 Units: 5000 [IU] | SUBCUTANEOUS | @ 20:00:00

## 2022-01-29 MED ADMIN — rifAXIMin (XIFAXAN) tablet 550 mg: 550 mg | ORAL | @ 21:00:00 | Stop: 2022-02-11

## 2022-01-29 MED ADMIN — heparin (porcine) 5,000 unit/mL injection 5,000 Units: 5000 [IU] | SUBCUTANEOUS | @ 10:00:00

## 2022-01-29 MED ADMIN — albumin human 25 % bottle 100 g: 100 g | INTRAVENOUS | @ 17:00:00 | Stop: 2022-01-31

## 2022-01-29 MED ADMIN — lactated ringers bolus 1,000 mL: 1000 mL | INTRAVENOUS | @ 14:00:00 | Stop: 2022-01-29

## 2022-01-29 MED ADMIN — lactulose oral solution: 20 g | ORAL | @ 20:00:00

## 2022-01-29 MED ADMIN — zinc sulfate (ZINCATE) capsule 220 mg: 220 mg | ORAL | @ 13:00:00

## 2022-01-29 MED ADMIN — lactulose oral solution: 20 g | ORAL | @ 03:00:00

## 2022-01-29 MED ADMIN — spironolactone (ALDACTONE) tablet 100 mg: 100 mg | ORAL | @ 13:00:00 | Stop: 2022-01-29

## 2022-01-29 MED ADMIN — atorvastatin (LIPITOR) tablet 10 mg: 10 mg | ORAL | @ 03:00:00

## 2022-01-29 MED ADMIN — ciprofloxacin HCl (CIPRO) tablet 500 mg: 500 mg | ORAL | @ 13:00:00 | Stop: 2022-02-12

## 2022-01-29 NOTE — Unmapped (Signed)
Hepatology Consult Service   Initial Consultation         Assessment and Recommendations:   Matthew Key is a 61 y.o. male with a PMHx of cryptogenic cirrhosis c/b ascites, HE, SBP, and non-bleeding EVs who is actively listed for liver transplant, hypertension, T2DM, hyperlipidemia, OSA on CPAP that presented to [facility] with hypotension and hepatic encephalopathy.    Decompensated Cryptogenic Cirrhosis, HE: He was sent in yesterday after Dr. Waynetta Sandy saw him in clinic where he was hypotensive and encephalopathic. He has had an infectious work-up that does not show evidence of an infectious etiology. He is not having overt GI bleeding. He has some electrolyte disturbances that could be the trigger for his HE. He needs evaluation for thrombosis. Fortunately, he is improved this morning and back to his mental status baseline with only lactulose. The precipitant of the hypotension is also unclear, possible this is all related to dehydration driving electrolyte disturbances and pre-renal AKI.   -- Lactulose, titrated to 3-5 BMs daily  -- Rifaximin 550mg  BID  -- Ciprofloxacin 500mg  PO daily (secondary SBP prophylaxis)  -- US liver doppler  -- Add 2g-sodium restriction to his diet order    Acute Kidney Injury: Somewhat difficult to ascertain his baseline SCr, likely in the 1-1.4 range, elevated to 2.6 on admission and now 2.7 this morning. He received 500cc NS in the ED and was ordered for 1L LR today. He is also more hyponatremic then normal. We agree with Med[facility] and are concerned for pre-renal injury; recommend standard 48-hour albumin challenge. If he fails this, he could be a terlipressin candidate.   -- Start albumin 25% 1g/kg daily x2 days  -- Hold further IVF/crystalloid boluses  -- Investigate for tappable ascitic fluid pocket  -- Primary team inquired about midodrine, would consider if MAPs consistently running <60  -- If no improvement with 48 hours IV albumin, we would consider him a terlipressin candidate    I have communicated recommendations with the patient's primary team    Thank you for involving [place] in the care of your patient. We will continue to follow along with you.    For questions, contact the on-call fellow for the Hepatology Consult Service at [phone].     Reason for Consultation:   The patient is seen in consultation at the request of Lawernce Keas, MD ([facility] ([facility])) for decompensated cirrhosis.    Subjective:   HPI:  The patient reports he is feeling well this morning and back to normal. Yesterday, he was seen in Dr. Laban Emperor clinic and found to be both hypotensive and with encephalopathy. As such, he was given IVF and sent to the ED for an evaluation. This morning, he reports no acute concerns. In the week prior to admission he denies any fevers, chills, cough, SOB, nausea, vomiting, GI bleeding, or abdominal pain. He thinks that he has been in his usual state of health. Per H&P, his wife has been out of town recently in [place]. He reports this has been okay, but that maybe he has been consuming less at home in regards to his PO intake. Despite this, he says he was taking care of all his needs on his own at home.     Objective:   Physical Exam:  Temp:  [36.4 ??C (97.5 ??F)-37.2 ??C (98.9 ??F)] 37.1 ??C (98.8 ??F)  Heart Rate:  [81-98] 94  Resp:  [14-21] 18  BP: (60-115)/(29-67) 69/34  SpO2:  [96 %-100 %] 100 %  Gen: Chronically ill-appearing male in NAD, answers questions appropriately  Eyes: Sclera anicteric  Abdomen: Soft, NTND, no rebound/guarding  Neuro: Normal speech. No asterixis.   Skin: No rashes, lesions on clothed exam  Psych: Alert, normal mood and affect.     Pertinent Labs/Studies Reviewed:  MELD-Na: 29 at 01/29/2022  8:47 AM  MELD: 25 at 01/29/2022  8:47 AM  Calculated from:  Serum Creatinine: 2.71 mg/dL at 2/95/6213  0:86 AM  Serum Sodium: 129 mmol/L at 01/29/2022  8:47 AM  Total Bilirubin: 2.6 mg/dL at 5/78/4696  2:95 AM  INR(ratio): 1.57 at 01/28/2022  4:31 PM

## 2022-01-29 NOTE — Unmapped (Addendum)
Matthew Key is a 61 y.o. male whose presentation is complicated by cryptogenic cirrhosis c/b ascites, nonbleeding EV, adrenal insufficiency, hx SBP, planning splenorenal shunt embolization currently listed for LT, CVA, OSA on CPAP, T2DM, HTN, HLD, hx Boerhaves syn 2/2 endoscopic repair ~20 years ago who presented to [facility] from GI clinic with hypotension and c/f infection.  He was evaluated by hepatology and nephrology and now stable for dc home with plan for close hepatology follow up.  Below are more details of his stay at [facility] according to problem;      Hypotension   Baseline BP typically 90-100s/50-60s. In GI clinic 6/20 with BP 66/31 > 75/34 after IVF.  Lactate 2.8, though afebrile and WBC wnl. UA bland.  CXR with subsegmental atelectasis and mild blunting of L costophrenic angle ?possibly small pleural effusion though patient denies any shortness of breath, saturating appropriately on room air and afebrile. Was given IV fluids and diuretics held.  Liver doppler with no evidence of PVT and infectious workup negative.  Cortisol 10. Ultimately, BP has improved after hydration and more likely hypotension related to volume.      Decompensated Cryptogenic Cirrhosis - Hx Esophageal Varices and SBP   Follows with Dr. Waynetta Sandy, seen in clinic on day of admit and c/f hypotension as above. Actively listed for LT. History of HE, non-bleeding G2 esophageal varices (last EGD 12/07/21). Main issues has been recurrent and refractory HE despite adequate treatment. Ammonia mildly elevated at 42, but no asterixis on exam and A&Ox4. Continued lactulose / rifaximin and held home spiro 100 mg daily and lasix.  Hepatology consulted and recs for nephrology to help guide AKI as below.  Following with [facility] as outpatient for splenorenal shunt. Will continue to get weekly labs with transplant team following.      Acute Kidney Injury  Patients creatinine 2.61 on admission. Baseline has been quite volatile lately, previously 1-1.2 but not stable over last several months. Most recent Cr 1.71 on 6/12. Reports adequate UOP.  Most likely etiology is pre-renal based on history as above, possibly concerned for HRS or ATN.  Was given IV fluids with little improvement and then Albumin. Nephrology consulted and thought likely pre-renal and now improving.  Will decrease lasix from 60-->40mg /day and aldactone from 100-->50mg /day to start 6/23.  He will have follow up labs in 4 days with transplant following.       Acute on Chronic Hyponatremia   Complication of decompensated cirrhosis. Exacerbations typically result in worsening hyponatremia frequently down to mid 120s, but as low as 119.  Not on thiazide diuretic.  Likely hypovolemic hyponatremia iso hypotension and intravascular volume depletion with albumin 2.5.  Held diuretics as above and Na is now stable.  Will plan on diuretic changes as above.     Chronic Problems  OSA on CPAP: continue home CPAP  T2DM-controlled : Last A1c 4.7%, reports [name] 40U daily but started with lower dose basal given AKI.  BG was fairly well controlled and will now dc with tresiba but hold ozempic until creatinine improved.    HTN: Held home lasix 60 mg, spironolactone, and lisinopril 2.5mg  daily iso hypotension and AKI.  Will now decrease diuretic as above and hold ACE-I for now.   HLD - Hx CVA: Cont home statin   Thrombocytopenia: Plts stable 71. Secondary to liver dx.

## 2022-01-29 NOTE — Unmapped (Signed)
Internal Medicine (MEDU) Progress Note    Assessment & Plan:   Matthew Key is a 61 y.o. 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    Principal Problem:    Hypotension  Active Problems:    Hyponatremia    Non-alcoholic micronodular cirrhosis of liver (CMS-HCC)    OSA on CPAP    Thrombocytopenia (CMS-HCC)    Type 2 diabetes mellitus without complication (CMS-HCC)    Anemia    AKI (acute kidney injury) (CMS-HCC)    Esophageal varices determined by endoscopy (CMS-HCC)    Gastroesophageal reflux disease    Hyperlipidemia    Hyperkalemia  Resolved Problems:    * No resolved hospital problems. *    Hypotension   Baseline BP typically 90-100s/50-60s. Today in GI clinic with BP 66/31 > 75/34 after IVF. BG normal. Transported by EMS for eval at ED where BP was closer to baseline at 101/40 on initial presentation. S/p 1.5L NS total. Lactate 2.8, though afebrile and WBC wnl. UA bland.  CXR with subsegmental atelectasis and mild blunting of L costophrenic angle ?possibly small pleural effusion though patient denies any shortness of breath, saturating appropriately on room air and afebrile.  Differential diagnosis includes hypovolemia/poor p.o. intake, adrenal insufficiency, SBP, decompensated cirrhosis, and other infectious etiology.   - Med M c/s for diagnostic para   - Hold lasix 60 mg, lisinopril 2.5 mg daily, spironolactone  Diagnostic:  - follow up liver doppler  - F/up Bcx, Urine culture    Decompensated Cryptogenic Cirrhosis - Hx Esophageal Varices, SBP   Follows with Dr. Waynetta Sandy, seen in clinic today and c/f hypotension as above. Actively listed for LT. History of HE, non-bleeding G2 esophageal varices (last EGD 12/07/21). Main issues has been recurrent and refractory HE despite adequate treatment. Ammonia mildly elevated at 42, but no asterixis on exam and A&Ox4. Will treat with ongoing lactulose / rifaximin for any possible component of HE though low suspicion.   - Hepatology following  - Lactulose titrated to 3-5 BM daily   - Rifaximine 550 mg BID   - SBP ppx 500 mg daily   - Regular diet tonight, will plan low sodium diet in AM   - hold home spiro 100 mg daily and lasix   - Following with VIR as outpatient for splenorenal shunt    Acute Kidney Injury  Patients creatinine 2.61 on admission. Baseline has been quite volatile lately, previously 1-1.2 but not stable over last several months. Most recent Cr 1.71 on 6/12. Reports adequate UOP.  Most likely etiology is pre-renal based on history as above, possibly concerned for HRS or ATN. However, given hyperkalemia and hyperphosphatemia, do have some concern this may represented insidious cause of worsening renal function other than simple AKI.   - Avoid nephrotoxins (including NSAIDs, contrasted studies, fleets enemas)  - albumin challenge 1g/kg daily for 2 days 6/20-21; consider nephrology consult if not improved  Diagnostic:  - follow up urine Na and Cr and urea  - Consider Renal US if no improvement with fluids   ??  Acute on Chronic Hyponatremia   Complication of decompensated cirrhosis. Exacerbations typically result in worsening hyponatremia frequently down to mid 120s, but as low as 119.  Not on thiazide diuretic.  Not currently with neurologic sequelae and no other risk factors predisposing to seizures, but does have cirrhosis predisposing him to encephalopathy.  Volume status with minimally distended abdomen and dry skin without any edema on clinical exam.  Likely hypovolemic hyponatremia iso hypotension and intravascular volume depletion with albumin 2.5.   - Daily CMP, trend Na   - Holding lasix  ??  Hyperkalemia   Has been associated with worsening renal function in past and recently increasing. EKG without any peaked T-waves on review.   - Eval of AKI as above   - No indication for acute shifting, but will monitor closely  - CMP in AM  - iCal  ??  Chronic Problems  OSA on CPAP: ordered CPAP, continuous O2 monitoring   T2DM: Last A1c 4.7%, reports Tresiba 40U daily but will start with prescribed 30u qAM given poor + SSI.   HTN: Hold home lasix 60 mg, spironolactone, and lisinopril 2.5mg  daily iso hypotension   HLD - Hx CVA: Cont home statin   Thrombocytopenia: Plts stable 71. Secondary to liver dx.     Daily Checklist:  Diet:   Active Orders   Diet    Nutrition Therapy Regular/House       DVT PPx: Heparin 5000units q8h  Electrolytes: Replete Potassium to >/=4 and Magnesium to >/=2  Code Status: Full Code  Dispo: Home    Team Contact Information:   Primary Team: Internal Medicine (MEDU)  Primary Resident: Idell Pickles, MD  Resident's Pager: [phone] (Gen MedU Intern - Tower)    Interval History:   No acute events overnight.    Was hypotensive but asymptomatic.   Ordered urine lytes.   Ordered albumin 2/2 aki (possible HRS), US liver doppler  Med M for Diagnostic para.    All other systems were reviewed and are negative except as noted in the HPI    Objective:     Temp:  [36.4 ??C (97.5 ??F)-37.2 ??C (98.9 ??F)] 37.1 ??C (98.8 ??F)  Heart Rate:  [80-95] 80  Resp:  [14-21] 16  BP: (69-118)/(34-70) 90/48  SpO2:  [96 %-100 %] 100 %,     Intake/Output Summary (Last 24 hours) at 01/29/2022 1418  Last data filed at 01/29/2022 1100  Gross per 24 hour   Intake 476 ml   Output 50 ml   Net 426 ml       Gen: Chronically ill appearing, NAD, converses and answers questions appropriately, mildly slowed speech but unclear if this is baseline  Eyes: Sclera anicteric, EOMI grossly normal   HENT: atraumatic,  Heart: RRR, systolic murmur present  Lungs: CTAB, no crackles or wheezes  Abdomen: soft, NT, only mildly distended   Extremities: no edema  Neuro: grossly symmetric, no asterixis   Skin:  +Dry, No rashes, lesions on clothed exam  Psych: Alert, oriented  ??    Labs/Studies: Labs and Studies from the last 24hrs per EMR and Reviewed

## 2022-01-29 NOTE — Unmapped (Signed)
PHYSICAL THERAPY  Evaluation (01/29/22 0943)          Patient Name:  Matthew Key       Medical Record Number: [id number]   Date of Birth: [birth year]  Sex: Male        Treatment Diagnosis: gait instability, LLE weakness, decreased balance strategies     Activity Tolerance: Tolerated treatment well     ASSESSMENT  Problem List: Impaired balance, Decreased endurance, Decreased mobility, Fall risk, Gait deviation      Assessment : Matthew Key is a 61 y.o. 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 Patient presents to PT with deficits noted above impacting safety/ independence with ambulation / dynamic balance activities. Expect he will benefit from cont PT in acute setting to maximize safety/ fall prevention strategies and functional mobility progression.  After review of contributing co-morbidities and personal factors, clinical presentation and exam findings, patient demonstrates low complexity for evaluation and development of plan of care.    Today's Interventions: Eval, standing balance, ambulation progression with education re: safe moblity strategies, fall prevention, benefits of moblity, role of PT, POC      PLAN  Planned Frequency of Treatment:  1-2x per day for: 3-4x week Planned Treatment Duration: 02/12/22     Planned Interventions: Balance activities, Education - Patient, Endurance activities, Home exercise program, Gait training, Functional mobility, Therapeutic exercise, Therapeutic activity     Post-Discharge Physical Therapy Recommendations:  3x weekly, Supervision for all mobility due to fall risk     PT DME Recommendations: None            Goals:   Patient and Family Goals: continue to improve mobility     Long Term Goal #1: Patient will engage in 20 minutes of cont CV activity to maximize functional endurance  / ability to participate in household / community based activity        SHORT GOAL #1: Pt will demo independence with all transfers               Time Frame : 2 weeks  SHORT GOAL #2: Pt will amb x 400 feet with mod I              Time Frame : 2 weeks                      Prognosis:  Good  Positive Indicators: PLOF, family support, particpation  Barriers to Discharge: Endurance deficits, Gait instability, Impaired Balance     SUBJECTIVE  Patient reports: consents to PT  I walked down the hall last night to use the bathroom  Current Functional Status: patient received in bed, session completion with patient in bathroom with RN Selena Batten present     Prior Functional Status: ambulatory and independent with ADL/Iadl, intermittent use of cane  when needed.  He reports his wife has been in [place] for her [employer] for the past few weeks so he has been  taking care of the house, grocery shopping, meal prep, on disability, awaiting liver Tx  Equipment available at home: Rolling walker, Straight cane, Paediatric nurse with back, Long-Handled Shoehorn, Long-Handled Sponge, Sock aid      Past Medical History:   Diagnosis  Date    AKI (acute kidney injury) (CMS-HCC) 12/14/2020    Anxiety 10/22/2013    Arthritis     Cervical radiculopathy 12/03/2016    Chronic pain disorder     Lower back    Cirrhosis (CMS-HCC)     Dental abscess 10/2020    Duodenal ulcer 12/01/2017    GERD (gastroesophageal reflux disease)     History of transfusion     Hyperlipidemia 10/22/2013    Hypertension     under control with meds and weight loss    Hypotension 01/29/2022    Liver disease     Sleep apnea, obstructive     Have machine    Stroke (CMS-HCC)     mild stroke    Type 2 diabetes mellitus with diabetic neuropathy, with long-term current use of insulin (CMS-HCC) 06/09/2014            Social History Tobacco Use    Smoking status: Never    Smokeless tobacco: Never   Substance Use Topics    Alcohol use: Not Currently       Past Surgical History:   Procedure Laterality Date    KNEE SURGERY      PR CATH PLACE/CORON ANGIO, IMG SUPER/INTERP,R&L HRT CATH, L HRT VENTRIC N/A 12/19/2020    Procedure: CATH LEFT/RIGHT HEART CATHETERIZATION;  Surgeon: Rosana Hoes, MD;  Location: [facility] CATH;  Service: Cardiology    PR COLONOSCOPY FLX DX W/COLLJ SPEC WHEN PFRMD N/A 12/07/2021    Procedure: COLONOSCOPY, FLEXIBLE, PROXIMAL TO SPLENIC FLEXURE; DIAGNOSTIC, W/WO COLLECTION SPECIMEN BY BRUSH OR WASH;  Surgeon: Annie Paras, MD;  Location: [facility];  Service: Gastroenterology    PR UPPER GI ENDOSCOPY,BIOPSY N/A 10/12/2020    Procedure: UGI ENDOSCOPY; WITH BIOPSY, SINGLE OR MULTIPLE;  Surgeon: Marene Lenz, MD;  Location: GI [facility];  Service: Gastroenterology    PR UPPER GI ENDOSCOPY,DIAGNOSIS N/A 01/03/2022    Procedure: UGI ENDO, INCLUDE ESOPHAGUS, STOMACH, & DUODENUM &/OR JEJUNUM; DX W/WO COLLECTION SPECIMN, BY BRUSH OR WASH;  Surgeon: Marene Lenz, MD;  Location: GI [facility]URES [facility];  Service: Gastroenterology    PR UPPER GI ENDOSCOPY,LIGAT VARIX N/A 12/07/2021    Procedure: UGI ENDO; Everlene Balls LIG ESOPH &/OR GASTRIC VARICES;  Surgeon: Annie Paras, MD;  Location: GI [facility]URES [facility];  Service: Gastroenterology    ROOT CANAL      Front teeth             Family History   Problem Relation Age of Onset    Edema Mother     Alzheimer's disease Father     Aortic dissection Brother     Early death Brother     Aneurysm Brother         Allergies: Venom-honey bee       Objective Findings  Precautions / Restrictions  Precautions: Falls precautions  Weight Bearing Status: Non-applicable  Required Braces or Orthoses: Non-applicable     Communication Preference: Verbal          Pain Comments: patient denies pain  Medical Tests / Procedures: orders, consults, labs reviewed  Equipment / Environment: Vascular access (PIV, TLC, Port-a-cath, PICC), Patient not wearing mask for full session, Telemetry     At Rest: HR 88 bpm, O2 sat 98% on RA BP 118/70(89)  With Activity: NAD  Orthostatics: denies        Living Situation  Living Environment: Apartment  Lives With: Spouse  Home Living: One level home,  Level entry, Standard height toilet, Tub/shower unit, Shower chair with back      Cognition comment: A&Ox4  Visual/Perception: Wears Glasses/Contacts     Skin Inspection: Intact where visualized     Upper Extremities  UE ROM: Right WFL, Left WFL  UE Strength: Right WFL, Left WFL    Lower Extremities  LE ROM: Right WFL, Left WFL  LE Strength: Left WFL, Right WFL  LE comment: slight LLE weakness appreciated with stance phase of ambulation, inconsistent, mild knee buckling     Sensation: WFL  Balance comment: sitting: good;  standing: supervison; dynamic standing: SBA  Posture: WFL      Bed Mobility: supine-sit EOB independent     Transfer comments: STS: supervision,wide BOS      Gait Level of Assistance: Standby assist, set-up cues, supervision of patient - no hands on  Gait Assistive Device: None  Gait Distance Ambulated (ft): 120 ft  Gait: unsteady with slow cadence, improves with increase gait speed, wide BOS, decreased step length     Stairs: n/a this date, lives in a 1 LH, level entry       Endurance: fair     Physical Therapy Session Duration  PT Individual [mins]: 31     Medical Staff Made Aware: RN Kim     I attest that I have reviewed the above information.  Signed: Madlyn Frankel Shonika Kolasinski, PT  Ceasar Mons 1/61/0960

## 2022-01-29 NOTE — Unmapped (Signed)
Pt alert and oriented x4. VSS, with no sign of distress. Pt remains free of falls and maintains steady gait. Pt tolerated medications with no adverse effects noted. Will continue to monitor.  Problem: Adult Inpatient Plan of Care  Goal: Plan of Care Review  Outcome: Progressing  Goal: Patient-Specific Goal (Individualized)  Outcome: Progressing  Flowsheets (Taken 01/29/2022 0416)  Patient-Specific Goals (Include Timeframe): Pt will remain free of falls during the shift  Individualized Care Needs: Monitor labs, VS, strict intake and output  Anxieties, Fears or Concerns: no concerns expressed  Goal: Absence of Hospital-Acquired Illness or Injury  Outcome: Progressing     Problem: Fall Injury Risk  Goal: Absence of Fall and Fall-Related Injury  Outcome: Progressing     Problem: Fluid Volume Excess  Goal: Fluid Balance  Outcome: Progressing

## 2022-01-29 NOTE — Unmapped (Signed)
Internal Medicine (MEDU) History & Physical    Assessment & Plan:   Matthew Key is a 61 y.o. 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    Principal Problem:    Hypotension  Active Problems:    Hyponatremia    Non-alcoholic micronodular cirrhosis of liver (CMS-HCC)    OSA on CPAP    Thrombocytopenia (CMS-HCC)    Type 2 diabetes mellitus without complication (CMS-HCC)    Anemia    AKI (acute kidney injury) (CMS-HCC)    Esophageal varices determined by endoscopy (CMS-HCC)    Gastroesophageal reflux disease    Hyperlipidemia    Hyperkalemia  Resolved Problems:    * No resolved hospital problems. *    Hypotension   Baseline BP typically 90-100s/50-60s. Today in GI clinic with BP 66/31 > 75/34 after IVF. BG normal. Transported by EMS for eval at ED where BP was closer to baseline at 101/40 on initial presentation. S/p 1.5L NS total. Lactate 2.8, though afebrile and WBC wnl. Patient reports his wife has been gone over the last 3 weeks in [place], and while he has good support system wonder if PO intake has been poor (new AKI as below) particularly in the setting of recent increase lasix and refractory HE. UA bland.  CXR with subsegmental atelectasis and mild blunting of L costophrenic angle ?possibly small pleural effusion though patient denies any shortness of breath, saturating appropriately on room air and afebrile.  Differential diagnosis includes hypovolemia/poor p.o. intake, adrenal insufficiency, SBP, other infectious etiology.  - S/p 1.5L LR   - Repeat lactate in AM   - F/up Bcx, Ucx  - Random Cortisol, if < 18 mcg/dl c/s endocrine for adrenal insufficiency  - No acute indication for abx at designated healthcare decision maker(s) is/are ***NAME (the patient's {Blank single:19197::legal guardian,Healthcare Power of Attorney,Durable Power of Attorney,spouse,adult child,parent,adult sibling,other adult,care team physician(s)}) as denoted by {HCDM Authority:51785}.    Objective:   Physical Exam:  Temp:  [36.6 ??C (97.8 ??F)-37.2 ??C (98.9 ??F)] 37.2 ??C (98.9 ??F)  Heart Rate:  [83-98] 88  Resp:  [15-19] 18  BP: (60-101)/(29-49) 101/43  SpO2:  [97 %-98 %] 97 %    Gen: NAD, converses   Eyes: Sclera anicteric, EOMI grossly normal   HENT: atraumatic, normocephalic  Neck: trachea midline  Heart: RRR  Lungs: CTAB, no crackles or wheezes  Abdomen: soft, NTND  Extremities: no edema  Neuro: grossly symmetric   Skin:  No rashes, lesions on clothed exam  Psych: Alert, oriented Esophageal Varices, SBP   Follows with Dr. Waynetta Sandy, seen in clinic today and c/f hypotension as above. Actively listed for LT. History of HE, non-bleeding G2 esophageal varices (last EGD 12/07/21). Main issues has been recurrent and refractory HE despite adequate treatment. Ammonia mildly elevated at 42, but no asterixis on exam and A&Ox4. Will treat with ongoing lactulose / rifaximin for any possible component of HE though low suspicion. No tappable pocket for diagnostic paracentesis on ultrasound exam so holding on empiric abx as above.   - Hepatology c/s, appreciate eval   - Lactulose 20 mg TID or titrated to 3-5 BM daily   - Rifaximine 550 mg BID   - Holding lasix 60 mg daily iso hypotension   -  Cont home spiro 100 mg daily   - SBP ppx 500 mg daily   - Regular diet tonight, will plan low sodium diet in AM   - Nutrition c/s  - Following with VIR as outpatient for splenorenal shunt  - Daily CMP, PT/INR, CBC  MELD-Na: 31 at 01/28/2022  4:31 PM  MELD: 25 at 01/28/2022  4:31 PM  Calculated from:  Serum Creatinine: 2.61 mg/dL at 1/61/0960  4:54 PM  Serum Sodium: 124 mmol/L (Using min of 125 mmol/L) at 01/28/2022  4:31 PM  Total Bilirubin: 3.0 mg/dL at 0/98/1191  4:78 PM  INR(ratio): 1.57 at 01/28/2022  4:31 PM    Chronic Problems  OSA on CPAP: ordered CPAP, continuous O2 monitoring   T2DM: Last A1c 4.7%, reports Tresiba 40U daily but will start with prescribed 30u qAM given poor + SSI.   HTN: Hold home lasix 60 mg and lisinopril 2.5mg  daily iso hypotension   HLD - Hx CVA: Cont home statin   Thrombocytopenia: Plts stable 71. Secondary to liver dx.       The patient's presentation is complicated by the following clinically significant conditions requiring additional evaluation and treatment or having a significant effect of this patient's care: - Malnutrition POA requiring further investigation, treatment, or monitoring  - Hyperkalemia POA requiring further investigation, treatment, or monitoring  - Hyponatremia POA requiring further investigation, treatment, or monitoring  - Hypotension POA requiring further investigation, treatment, or monitoring       Checklist:  Diet: Sodium Restricted (2g) and Regular Diet  DVT PPx: Heparin 5000units q8h  Code Status: Full Code  Dispo: Patient appropriate for Observation based on expectation of ongoing need for hospitalization less than two midnights and/or low intensity of services provided    Chief Concern:   Hypotension      Subjective:   Matthew Key is a 61 y.o. 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    History obtained by Vernell Barrier, MD from patient       HPI:  Matthew Key  is currently listed for liver transplant and is being closely monitored.  Reports he was in his normal state of health although feeling slightly malaised over the last 1 week when he presented for his previously scheduled GI appointment with Dr. Waynetta Sandy.  In clinic patient was noted to be hypotensive (66/31) with slowed speech and poor attention concerning for hypovolemia vs hepatic encephalopathy vs infection.  Started on IVF in clinic, total 1 L NS and called EMS for transport to ED for further evaluation.  BG 152.    Patient reports that he has been trying to eat and drink appropriately over the last several months as he knows important to have good intake prior to liver transplant.  Reports he is very picky eater and has difficulty eating high protein, low sodium diet.  Additionally his wife with whom he lives has been in [place] for the last 3 weeks and will not be returning until 1 week from now.  He has a strong support system with his sisters, son nearby checking in on him as well as a neighbor who frequently checks in on him.  Unclear how much he has been eating or drinking at home.  Reports some  orthostasis prior to clinic visit, but has been able to complete all ADLs independently at home without issue.  Denies any fever, chills, nausea, vomiting, dysuria, diarrhea or constipation, headaches, cough or shortness of breath.  No sick contacts.  Denies any abdominal pain or worsening distention, reports he remembers what SBP feels like an this is not SBP     On presentation to ED he was s/p 1 L NS in clinic with improvement in vital signs, BP 101/40, afebrile, HR 80s, satting appropriately on RA.  Received second 0.5 L NS.  Initial labs with significant findings of wnl WBC 7.8, stable hgb 10, stable plts 71.  CMP significant for Na 124, K 5.6, CO2 17, Cr 2.61, Phos 5.2, Albumin 2.5, Tbili 3.0, AST 76, ALT 44, AP 310. Ammonia 42. INR 1.52. Lactate 2.8. UA bland. EKG similar to previous on review with no peaked T-waves. No tappable pocked on [place] of abdomen.     Designated Environmental health practitioner:  Mr. Hoffmeier currently has decisional capacity for healthcare decision-making and is able to designate a surrogate healthcare decision maker. Mr. Agyeman designated healthcare decision maker(s) is/are Kenric Ginger (the patient's adult child) as denoted by stated patient preference. Typically is wife Cy Bresee but she is out of the country at time of admission and he would like decision maker available in the [place].     Objective:   Physical Exam:  Temp:  [36.4 ??C (97.5 ??F)-37.2 ??C (98.9 ??F)] 36.4 ??C (97.5 ??F)  Heart Rate:  [81-98] 90  Resp:  [14-21] 18  BP: (60-115)/(29-67) 92/47  SpO2:  [96 %-98 %] 96 %    Gen: Chronically ill appearing, NAD, converses and answers questions appropriately, mildly slowed speech but unclear if this is baseline  Eyes: Sclera anicteric, EOMI grossly normal   HENT: atraumatic,  Heart: RRR, systolic murmur present  Lungs: CTAB, no crackles or wheezes  Abdomen: soft, NT, only mildly distended   Extremities: no edema  Neuro: grossly symmetric, no asterixis   Skin:  +Dry, No rashes, lesions on clothed exam  Psych: Alert, oriented

## 2022-01-29 NOTE — Unmapped (Signed)
Updated Matthew Key Matthew Key score in [place] today with the following labs:  Recertification of  Key is 29 via UNOS    Matthew Key was also noted to have the following Mild Encephalopathy and Controlled Ascites and this was noted in his Key upgrade today.  Lab Results   Component Value Date    CREATININE 2.71 (H) 01/29/2022    NA 129 (L) 01/29/2022    BILITOT 2.6 (H) 01/29/2022    ALBUMIN 2.2 (L) 01/29/2022    INR 1.57 01/28/2022

## 2022-01-29 NOTE — Unmapped (Signed)
Care Management  Initial Transition Planning Assessment                Per chart review, patient is a 61 y.o. 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     Patient lives with Tyrez Berrios, wife in [place] [facility]) in an apartment on the 1st floor with no step to enter. At baseline patient is independent with ADL's. He does not use home health services currently, [facility] was the agency to provide [facility] PT/OT but discontinued. DME is a R. Walker, straight cane, shower chair, and raised toilet seat. Family member may transport home and wife assist with basic care.     Type of Residence: Mailing Address:  [address]  [place] [address]  Contacts:    Patient Phone Number: [phone]        Medical Provider(s): Loman Brooklyn, MD  Reason for Admission: Admitting Diagnosis:  Hyperkalemia [E87.5]  Hyponatremia [E87.1]  AKI (acute kidney injury) (CMS-HCC) [N17.9]  Past Medical History:   has a past medical history of AKI (acute kidney injury) (CMS-HCC) (12/14/2020), Anxiety (10/22/2013), Arthritis, Cervical radiculopathy (12/03/2016), Chronic pain disorder, Cirrhosis (CMS-HCC), Dental abscess (10/2020), Duodenal ulcer (12/01/2017), GERD (gastroesophageal reflux disease), History of transfusion, Hyperlipidemia (10/22/2013), Hypertension, Hypotension (01/29/2022), Liver disease, Sleep apnea, obstructive, Stroke (CMS-HCC), and Type 2 diabetes mellitus with diabetic neuropathy, with long-term current use of insulin (CMS-HCC) (06/09/2014).  Past Surgical History:   has a past surgical history that includes pr upper gi endoscopy,biopsy (N/A, 10/12/2020); pr cath place/coron angio, img super/interp,r&l hrt cath, l hrt ventric (N/A, 12/19/2020); Knee surgery; Root canal; pr upper gi endoscopy,ligat varix (N/A, 12/07/2021); pr colonoscopy flx dx w/collj spec when pfrmd (N/A, 12/07/2021); and pr upper gi endoscopy,diagnosis (N/A, 01/03/2022).   Previous admit date: 10/12/2021    Primary Insurance- Payor: HUMANA MEDICARE ADV / Plan: HUMANA GOLD PLUS HMO / Product Type: *No Product type* /   Secondary Insurance - None  Prescription Coverage - Insurance  Preferred Pharmacy - [id number] - [place], [place] - [address]  [facility]  [facility] - [place], [place] - [address]  [facility]    Transportation home: Private vehicle or transportation service       General  Care Manager assessed the patient by : In person interview with patient, Medical record review, Discussion with Clinical Care team  Orientation Level: Oriented X4  Functional level prior to admission: Independent  Reason for referral: Discharge Planning    Contact/Decision Maker  Extended Emergency Contact Information  Primary Emergency Contact: Graca,Sherry  Mobile Phone: [phone]  Relation: Spouse  Secondary Emergency Contact: Coughlin,Brayant Malcolm [place]  Mobile Phone: [phone]  Relation: Son    Legal Next of Kin / Guardian / POA / Advance Directives     HCDM (patient stated preference): Lyness,Sherry - Spouse - [phone]    HCDM, back-up (If primary HCDM is unavailable): Angle, Karel - Son [phone]    Advance Directive (Medical Treatment)  Does patient have an advance directive covering medical treatment?: Patient does not have advance directive covering medical treatment.  Health Care Decision Maker [HCDM] (Medical & Mental Health Treatment)  Healthcare Decision Maker: HCDM documented in the HCDM/Contact Info section.    Readmission Information    Have you been hospitalized in the last 30 days?: No     Did the following happen with your discharge?    Patient Information  Lives with: Spouse/significant other    Type of Residence: Private residence (Lives with wife in an apartment on the 1st floor with not step to enter.)     Support Systems/Concerns: Spouse, Family Members    Responsibilities/Dependents at home?: No    Home Care services in place prior to admission?: Other (Comment) (Patient has home PT/OT service from [facility] but service discontinued for couple weeks)     Equipment Currently Used at Home: cane, straight, walker, rolling, shower chair, raised toilet seat  Current HME Agency (Name/Phone #): Unknown    Currently receiving outpatient dialysis?: No     Financial Information     Need for financial assistance?: No     Social Determinants of Health  Social Determinants of Health     Financial Resource Strain: Low Risk     Difficulty of Paying Living Expenses: Not hard at all   Internet Connectivity: Not on file   Food Insecurity: No Food Insecurity    Worried About Programme researcher, broadcasting/film/video in the Last Year: Never true    Barista in the Last Year: Never true   Tobacco Use: Low Risk     Smoking Tobacco Use: Never    Smokeless Tobacco Use: Never    Passive Exposure: Not on file   Housing/Utilities: Low Risk     Within the past 12 months, have you ever stayed: outside, in a car, in a tent, in an overnight shelter, or temporarily in someone else's home (i.e. couch-surfing)?: No    Are you worried about losing your housing?: No    Within the past 12 months, have you been unable to get utilities (heat, electricity) when it was really needed?: No   Alcohol Use: Not on file   Transportation Needs: No Transportation Needs    Lack of Transportation (Medical): No    Lack of Transportation (Non-Medical): No   Substance Use: Not on file   Health Literacy: Not on file   Physical Activity: Not on file   Interpersonal Safety: Not on file   Stress: Not on file   Intimate Partner Violence: Not on file   Depression: Not on file   Social Connections: Not on file       Complex Discharge Information    Is patient identified as a difficult/complex discharge?: No    Discharge Needs Assessment  Concerns to be Addressed: other (see comments) (CM will follow for DC needs.)    Clinical Risk Factors: New Diagnosis, Multiple Diagnoses (Chronic)    Barriers to taking medications: No    Prior overnight hospital stay or ED visit in last 90 days: Yes [facility] 01/03/22 for procedure)    Patient's Choice of Community Agency(s): Choice of choice of community agency will be provided if deemed medically necessary.    Anticipated Changes Related to Illness: other (see comments) (Likely will need time to recover before resuming usual activities.)    Equipment Needed After Discharge: other (see comments) (CM will follow for DME needs.)    Discharge Facility/Level of Care Needs: other (see comments) (CM will follow for DC needs.)    Readmission  Risk of Unplanned Readmission Score:  %  Predictive Model Details   No score data available for [facility] Risk of Unplanned Readmission     Readmitted Within the Last 30 Days? (No if blank)   Patient at risk for readmission?: Yes    Discharge Plan  Screen findings are: Discharge planning needs identified or anticipated (Comment). (CM will follow for DC needs.)    Expected Discharge Date: 02/01/2022    Expected Transfer from Critical Care:       Patient and/or family were provided with choice of facilities / services that are available and appropriate to meet post hospital care needs?: Other (Comment) (Choice of facilities/services will be provided if deemed medically necessary.)     Initial Assessment complete?: Yes

## 2022-01-29 NOTE — Unmapped (Signed)
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     Anthropometric Data:  Height: 172.7 cm (5' 8)   Admission weight: 98 kg (216 lb 1.6 oz)  Last recorded weight: 98 kg (216 lb 1.6 oz)  IBW: 69.92 kg  Percent IBW: 140.19 %  BMI: Body mass index is 32.86 kg/m??.   Usual Body Weight:  198 lbs in February 2023    Weight history prior to admission:   Wt Readings from Last 10 Encounters:   01/28/22 98 kg (216 lb 1.6 oz)   01/14/22 89.8 kg (198 lb)   01/03/22 88.9 kg (196 lb)   12/31/21 90.4 kg (199 lb 6.4 oz)   12/11/21 90 kg (198 lb 7.1 oz)   12/07/21 89.8 kg (198 lb)   11/26/21 92.1 kg (203 lb)   10/23/21 100.7 kg (222 lb 0.1 oz)   10/08/21 90 kg (198 lb 8 oz)   10/01/21 92.4 kg (203 lb 12.8 oz)        Weight changes this admission:   Last 5 Recorded Weights    01/28/22 2317   Weight: 98 kg (216 lb 1.6 oz)        Nutrition Focused Physical Exam:  Unable to complete at this time due to patient's pain level  - patient reports severe headache at time of visit       NUTRITIONALLY RELEVANT DATA     Medications:   Nutritionally pertinent medications reviewed and evaluated for potential food and/or medication interactions.     Labs:   Nutritionally pertinent labs reviewed.     Lab Results   Component Value Date    BUN 35 (H) 01/29/2022    CREATININE 2.71 (H) 01/29/2022    GFRAA 84 04/05/2019    GFRNONAA 72 04/05/2019    NA 129 (L) 01/29/2022    K 5.2 (H) 01/29/2022    CL 100 01/29/2022    CO2 18.0 (L) 01/29/2022    CALCIUM 7.9 (L) 01/29/2022    PHOS 5.2 (H) 01/28/2022    ALBUMIN 2.2 (L) 01/29/2022     Lab Results   Component Value Date    MG 1.5 (L) 01/14/2022    MG 1.5 (L) 12/11/2021    MG 1.7 10/25/2021    PHOS 5.2 (H) 01/28/2022    PHOS 3.0 01/14/2022    PHOS 3.4 12/11/2021       Lab Results   Component Value Date    CHOL 149 12/13/2019    HDL 63 12/13/2019    LDL 68 12/13/2019    TRIG 95 12/13/2019       Lab Results   Component Value Date    A1C 4.7 (L) 01/14/2022    A1C 4.6 (L) 12/11/2021    A1C 5.8 (H) 08/25/2021       Nutrition History:   January 29, 2022: Prior to admission:  Patient reports drinking 3 Boost High Protein shakes per day at home. He reports eating ok, but sometimes became full very quickly.     Allergies, Intolerances, Sensitivities, and/or Cultural/Religious Dietary Restrictions: none identified per chart review at this time  Current Nutrition:  Oral intake       Nutritional Needs:   Daily Estimated Nutrient Needs:  Energy: 1750- 2100 kcals [25-30 kcal/kg using ideal body weight, 70 kg]  Protein: 85- 105 gm [1.2-1.5 gm/kg using ideal body weight, 70 kg]  Fluid:   [per MD team]  Sodium:  <2000 mg       Malnutrition assessment not yet completed at this time due to inability to complete nutrition focused physical exam (NFPE).    GOALS and EVALUATION     Patient to consume 75% or greater of po intake via combination of meals, snacks, and/or oral supplements within 48 hours.  - New/Progressing    Motivation, Barriers, and Compliance:  Evaluation of motivation, barriers, and compliance pending at this time due to clinical status.     NUTRITION ASSESSMENT     Current  nutrition therapy is appropriate although not meeting nutritional  needs at this time due to infection, drowsiness, fatigue  Patient would benefit from start of oral supplement to better meet nutritional needs.      Discharge Planning:   Monitor for potential discharge needs with multi-disciplinary team.       NUTRITION INTERVENTIONS and RECOMMENDATION     Continue Regular diet   Added Ensure Max Protein TID via [employer]  Encouraged patient to prioritize nutrition, and to avoid skipping meals as much as possible     Follow-Up Parameters:   1-2 times per week (and more frequent as indicated)      Lanelle Bal, RD, LDN, CCTD  Abdominal Transplant Dietitian   Pager: [phone]

## 2022-01-30 LAB — COMPREHENSIVE METABOLIC PANEL
ALBUMIN: 2.8 g/dL — ABNORMAL LOW (ref 3.4–5.0)
ALKALINE PHOSPHATASE: 213 U/L — ABNORMAL HIGH (ref 46–116)
ALT (SGPT): 28 U/L (ref 10–49)
ANION GAP: 10 mmol/L (ref 5–14)
AST (SGOT): 41 U/L — ABNORMAL HIGH (ref <=34)
BILIRUBIN TOTAL: 2.6 mg/dL — ABNORMAL HIGH (ref 0.3–1.2)
BLOOD UREA NITROGEN: 41 mg/dL — ABNORMAL HIGH (ref 9–23)
BUN / CREAT RATIO: 15
CALCIUM: 8.2 mg/dL — ABNORMAL LOW (ref 8.7–10.4)
CHLORIDE: 106 mmol/L (ref 98–107)
CO2: 20 mmol/L (ref 20.0–31.0)
CREATININE: 2.68 mg/dL — ABNORMAL HIGH
EGFR CKD-EPI (2021) MALE: 26 mL/min/{1.73_m2} — ABNORMAL LOW (ref >=60)
GLUCOSE RANDOM: 203 mg/dL — ABNORMAL HIGH (ref 70–179)
POTASSIUM: 4.6 mmol/L (ref 3.4–4.8)
PROTEIN TOTAL: 5 g/dL — ABNORMAL LOW (ref 5.7–8.2)
SODIUM: 136 mmol/L (ref 135–145)

## 2022-01-30 LAB — CBC
HEMATOCRIT: 23.7 % — ABNORMAL LOW (ref 39.0–48.0)
HEMOGLOBIN: 8.4 g/dL — ABNORMAL LOW (ref 12.9–16.5)
MEAN CORPUSCULAR HEMOGLOBIN CONC: 35.5 g/dL (ref 32.0–36.0)
MEAN CORPUSCULAR HEMOGLOBIN: 35.6 pg — ABNORMAL HIGH (ref 25.9–32.4)
MEAN CORPUSCULAR VOLUME: 100 fL — ABNORMAL HIGH (ref 77.6–95.7)
MEAN PLATELET VOLUME: 12.3 fL — ABNORMAL HIGH (ref 6.8–10.7)
PLATELET COUNT: 47 10*9/L — ABNORMAL LOW (ref 150–450)
RED BLOOD CELL COUNT: 2.37 10*12/L — ABNORMAL LOW (ref 4.26–5.60)
RED CELL DISTRIBUTION WIDTH: 16.2 % — ABNORMAL HIGH (ref 12.2–15.2)
WBC ADJUSTED: 4 10*9/L (ref 3.6–11.2)

## 2022-01-30 LAB — UREA NITROGEN, URINE: UREA NITROGEN URINE: 415 mg/dL

## 2022-01-30 LAB — SODIUM, URINE, RANDOM: SODIUM URINE: 12 mmol/L

## 2022-01-30 LAB — CREATININE, URINE: CREATININE, URINE: 113.6 mg/dL

## 2022-01-30 MED ADMIN — rifAXIMin (XIFAXAN) tablet 550 mg: 550 mg | ORAL | @ 03:00:00 | Stop: 2022-02-11

## 2022-01-30 MED ADMIN — atorvastatin (LIPITOR) tablet 10 mg: 10 mg | ORAL | @ 03:00:00

## 2022-01-30 MED ADMIN — rifAXIMin (XIFAXAN) tablet 550 mg: 550 mg | ORAL | @ 13:00:00 | Stop: 2022-02-11

## 2022-01-30 MED ADMIN — lactulose oral solution: 20 g | ORAL | @ 13:00:00

## 2022-01-30 MED ADMIN — zinc sulfate (ZINCATE) capsule 220 mg: 220 mg | ORAL | @ 13:00:00

## 2022-01-30 MED ADMIN — ciprofloxacin HCl (CIPRO) tablet 500 mg: 500 mg | ORAL | @ 13:00:00 | Stop: 2022-02-12

## 2022-01-30 MED ADMIN — heparin (porcine) 5,000 unit/mL injection 5,000 Units: 5000 [IU] | SUBCUTANEOUS | @ 18:00:00

## 2022-01-30 MED ADMIN — lactulose oral solution: 20 g | ORAL | @ 18:00:00

## 2022-01-30 MED ADMIN — heparin (porcine) 5,000 unit/mL injection 5,000 Units: 5000 [IU] | SUBCUTANEOUS | @ 09:00:00

## 2022-01-30 MED ADMIN — heparin (porcine) 5,000 unit/mL injection 5,000 Units: 5000 [IU] | SUBCUTANEOUS | @ 03:00:00

## 2022-01-30 MED ADMIN — gabapentin (NEURONTIN) capsule 300 mg: 300 mg | ORAL | @ 03:00:00

## 2022-01-30 MED ADMIN — lactulose oral solution: 20 g | ORAL | @ 03:00:00

## 2022-01-30 MED ADMIN — albumin human 25 % bottle 100 g: 100 g | INTRAVENOUS | @ 13:00:00 | Stop: 2022-01-30

## 2022-01-30 NOTE — Unmapped (Signed)
Internal Medicine (MEDU) Progress Note    Assessment & Plan:   Matthew Key is a 61 y.o. 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    Principal Problem:    Hypotension  Active Problems:    Hyponatremia    Non-alcoholic micronodular cirrhosis of liver (CMS-HCC)    OSA on CPAP    Thrombocytopenia (CMS-HCC)    Type 2 diabetes mellitus without complication (CMS-HCC)    Anemia    AKI (acute kidney injury) (CMS-HCC)    Esophageal varices determined by endoscopy (CMS-HCC)    Gastroesophageal reflux disease    Hyperlipidemia    Hyperkalemia  Resolved Problems:    * No resolved hospital problems. *    Hypotension   Baseline BP typically 90-100s/50-60s. In GI clinic 6/20 with BP 66/31 > 75/34 after IVF. Transported by EMS for eval at ED where BP was closer to baseline at 101/40 on initial presentation. S/p 1.5L NS total. Lactate 2.8, though afebrile and WBC wnl. UA bland.  CXR with subsegmental atelectasis and mild blunting of L costophrenic angle ?possibly small pleural effusion though patient denies any shortness of breath, saturating appropriately on room air and afebrile.  Differential diagnosis includes hypovolemia/poor p.o. intake, adrenal insufficiency, SBP, decompensated cirrhosis.   - Hold lasix 60 mg, lisinopril 2.5 mg daily, spironolactone  Diagnostic:  - liver doppler - no evidence of PVT  - Bcx, Urine culture - unconcerning  - Med M - not big enough pocket    Decompensated Cryptogenic Cirrhosis - Hx Esophageal Varices, SBP   Follows with Dr. Waynetta Sandy, seen in clinic today and c/f hypotension as above. Actively listed for LT. History of HE, non-bleeding G2 esophageal varices (last EGD 12/07/21). Main issues has been recurrent and refractory HE despite adequate treatment. Ammonia mildly elevated at 42, but no asterixis on exam and A&Ox4. Will treat with ongoing lactulose / rifaximin for any possible component of HE though low suspicion.   - Hepatology following  - Lactulose titrated to 3-5 BM daily   - Rifaximin 550 mg BID   - SBP ppx 500 mg daily   - hold home spiro 100 mg daily and lasix   - Following with VIR as outpatient for splenorenal shunt    Acute Kidney Injury  Patients creatinine 2.61 on admission. Baseline has been quite volatile lately, previously 1-1.2 but not stable over last several months. Most recent Cr 1.71 on 6/12. Reports adequate UOP.  Most likely etiology is pre-renal based on history as above, possibly concerned for HRS or ATN. However, given hyperkalemia and hyperphosphatemia, do have some concern this may represented insidious cause of worsening renal function other than simple AKI.   - Avoid nephrotoxins (including NSAIDs, contrasted studies, fleets enemas)  - albumin challenge 1g/kg daily for 2 days 6/20-21  - nephrology consulted for trial on terlipressin 6/22 if cr doesn't improve  Diagnostic:  - urine Na and Cr and urea - FENA suggests prerenal  - Consider Renal US if no improvement with fluids      Acute on Chronic Hyponatremia   Complication of decompensated cirrhosis. Exacerbations typically result in worsening hyponatremia frequently down to mid 120s, but as low as 119.  Not on thiazide diuretic.  Not currently with neurologic  sequelae and no other risk factors predisposing to seizures, but does have cirrhosis predisposing him to encephalopathy.  Likely hypovolemic hyponatremia iso hypotension and intravascular volume depletion with albumin 2.5.   - Daily CMP, trend Na   - Holding lasix        Chronic Problems  OSA on CPAP: ordered CPAP, continuous O2 monitoring   T2DM: Last A1c 4.7%, reports Tresiba 40U daily but will start with prescribed 30u qAM given poor + SSI.   HTN: Hold home lasix 60 mg, spironolactone, and lisinopril 2.5mg  daily iso hypotension   HLD - Hx CVA: Cont home statin   Thrombocytopenia: Plts stable 71. Secondary to liver dx.     Daily Checklist:  Diet:   Active Orders   Diet    Nutrition Therapy Regular/House; Sodium Restricted (2 gm Na+)       DVT PPx: Heparin 5000units q8h  Electrolytes: Replete Potassium to >/=4 and Magnesium to >/=2  Code Status: Full Code  Dispo: Home    Team Contact Information:   Primary Team: Internal Medicine (MEDU)  Primary Resident: Idell Pickles, MD  Resident's Pager: [phone] (Gen MedU Intern - Tower)    Interval History:   No acute events overnight.    Desatted a little last night (hx of OSA) but placed on 2L [place] with no more desats. On RA now satting in low 90s.   Was sleeping peacefully then later was talking to liver transplant surgery.    AKI stable bad but will consult nephrology for trial on terlipressin tomorrow.     All other systems were reviewed and are negative except as noted in the HPI    Objective:     Temp:  [35.8 ??C (96.5 ??F)-37 ??C (98.6 ??F)] 35.8 ??C (96.5 ??F)  Heart Rate:  [90-112] 97  Resp:  [16-18] 18  BP: (112-117)/(53-56) 117/56  FiO2 (%):  [30 %] 30 %  SpO2:  [88 %-97 %] 93 %,     Intake/Output Summary (Last 24 hours) at 01/30/2022 1200  Last data filed at 01/30/2022 4540  Gross per 24 hour   Intake 400 ml   Output 375 ml   Net 25 ml       Gen: Chronically ill appearing, NAD  Eyes: Sclera anicteric, EOMI grossly normal   HENT: atraumatic,  Heart: RRR, systolic murmur present  Lungs: CTAB, no crackles or wheezes  Abdomen: soft, NT, only mildly distended   Extremities: no edema  Neuro: grossly symmetric, no asterixis   Skin:  +Dry, No rashes, lesions on clothed exam  Psych: Alert, oriented       Labs/Studies: Labs and Studies from the last 24hrs per EMR and Reviewed

## 2022-01-30 NOTE — Unmapped (Cosign Needed)
Nephrology Consult Note    Requesting Attending Physician :  Janell Quiet Mock, MD  Service Requesting Consult : Med General Doristine Counter ([facility])  Reason for Consult: AKI    Assessment: Matthew Key is a 61 y.o. male who presented to [facility] with hypotension and AMS. Nephrology has been consulted for AKI.    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 Markedly hypotensive in clinic- suspect hypoperfusion injury likely a significant driver on top of this HRS physiology (and urine sediment personally reviewed 6/21 was bland which supports this rather than intrinsic injury or ATN). Urine Na of 12 despite diuretics likely represents sodium avidity related to hypovoelmia and HRS physiology. UA with hyaline casts which is consistent with low EABV state. Overall unclear driver of hypotension- no record that diuretics were actually uptitrated at that time- may have been misunderstanding, lactulose-related diarrhea, infection, or other driver.  - Given improving Sodium and return of BP to baseline, suspect we've corrected EABV (s/p 2.5L crystalloid and 200g of 25% Albumin)  - Wouldn't recommend terlipressin or other HRS treatment at this time as suspicion is highest for hypovolemia, although it's possible that following fluid replacement he may require some BP support (ex. Midodrine) to avoid hypoperfusion injury down the line. We'll continue to evaluate day by day.  - Hypotension workup per primary team  - Hold diuretics - may benefit from lower dose after this improves  - Priority should be given to maintaining MAPs > 65    # Decompensated Cirrhosis   - Evaluation and management per primary team  - No changes to management from a nephrology standpoint at this time    RECOMMENDATIONS:   - Continue supportive care  - Please keep MAPs > 65  - We will continue to follow.     Discussed recommendations with primary team.    Elmer Picker, MD  01/30/2022 11:51 AM  _____________________________________________________________________________________    History of Present Illness: Matthew Key is 61 y.o. male with decompensated cryptogenic cirrhosis (c/b ascites, HE, nonbleeding EV, SBP) awaiting liver transplant, HTN, T2DM, HLD, OSA (CPAP) who is seen in consultation at the request of Lawernce Keas, MD and Med General Doristine Counter ([facility]). Nephrology has been consulted for AKI. History obtained from chart review of internal medical records and the patient. Pt was seen in clinic by Dr. Waynetta Sandy 2 days ago at which time he was hypotensive as low as 66/31 (baseline 110s/50s) as well as encephalopathic. On arrival to the ED she had an sCr of 2.6 (baseline prior 1.1-1.4 although climbing over last 2 months), with Na 124 (prior to this was 130), K 5.6, total CO2 17. NH3 42 ,then rechecked and 214. UA with no abnl cells but 17 hyaline casts, Urine Na 12. Since clinic she's received 2.5L crystalloid, 100g 25% albumin x 2. Her BP as of this AM was 117/56 (prior to receiving the second albumin bolus); sCr remains 2.68, Bicarb 20, Na 136.    In talking with pt, he denies recent diarrhea or vomiting, nor incr stool output due to his lactulose. His diuretics were recently increased last Friday per his report, although I can't find record of this and chart suggests he's been on the same doses since 5/22. PO intake at home has been good. He hasn't had worsening ascites or diuretics. No fevers or abdominal pain. Looking at the chart, it seems  he's reached out to his transplant team on multiple occasions for weakness and shakiness, including one fall 6/9, one day where he had 20 BMs (6/10), severe back pain,and fatigue.    INPATIENT MEDICATIONS:    Current Facility-Administered Medications:   ???  acetaminophen (TYLENOL) tablet 650 mg, Oral, Q6H PRN  ???  atorvastatin (LIPITOR) tablet 10 mg, Oral, Nightly  ???  ciprofloxacin HCl (CIPRO) tablet 500 mg, Oral, Q24H [facility]  ???  dextrose (D10W) 10% bolus 125 mL, Intravenous, Q10 Min PRN  ???  gabapentin (NEURONTIN) capsule 300 mg, Oral, At bedtime  ???  glucagon injection 1 mg, Intramuscular, Once PRN  ???  glucose chewable tablet 16 g, Oral, Q10 Min PRN  ???  heparin (porcine) 5,000 unit/mL injection 5,000 Units, Subcutaneous, Q8H SCH  ???  hydrOXYzine (ATARAX) tablet 50 mg, Oral, Nightly PRN  ???  insulin lispro (HumaLOG) injection 0-20 Units, Subcutaneous, ACHS  ???  lactulose oral solution, Oral, TID  ???  melatonin tablet 3 mg, Oral, Nightly PRN  ???  ondansetron (ZOFRAN-ODT) disintegrating tablet 4 mg, Oral, Q8H PRN **OR** ondansetron (ZOFRAN) injection 4 mg, Intravenous, Q8H PRN  ???  rifAXIMin (XIFAXAN) tablet 550 mg, Oral, BID  ???  zinc sulfate (ZINCATE) capsule 220 mg, Oral, Daily    OUTPATIENT MEDICATIONS:  Prior to Admission medications    Medication Dose, Route, Frequency   acetaminophen (TYLENOL) 325 MG tablet 650 mg, Oral, Every 6 hours PRN   atorvastatin (LIPITOR) 10 MG tablet 10 mg, Oral, Nightly   ciprofloxacin HCl (CIPRO) 500 MG tablet TAKE 1 TABLET BY MOUTH IN THE MORNING   furosemide (LASIX) 20 MG tablet 60 mg, Oral, Daily (standard)   gabapentin (NEURONTIN) 300 MG capsule 300 mg, Oral, At bedtime   hydrOXYzine (VISTARIL) 50 MG capsule 50 mg, Oral, Nightly PRN   insulin syringe-needle U-100 1 mL 31 gauge x 5/16 (8 mm) Syrg Use once as needed for Hydrocortisone injection.Marland Kitchen   lactulose 10 gram/15 mL solution 20 g, Oral, 3 times a day (standard)   lisinopriL (PRINIVIL,ZESTRIL) 2.5 MG tablet 2.5 mg, Oral, Daily (standard)   ondansetron (ZOFRAN-ODT) 4 MG disintegrating tablet TAKE 1 TABLET BY MOUTH EVERY 6 HOURS FOR 7 DAYS   ondansetron (ZOFRAN-ODT) 4 MG disintegrating tablet 4 mg, Oral, Every 8 hours PRN   pantoprazole (PROTONIX) 40 MG tablet 40 mg, Oral, Daily (standard)   pen needle, diabetic (BD ULTRA-FINE NANO PEN NEEDLE) 32 gauge x 5/32 (4 mm) Ndle ok to sub any brand or size needle preferred by insurance/patient, use 1-2x/day, dx E11.65   rifAXIMin (XIFAXAN) 550 mg Tab 550 mg, Oral, 2 times a day (standard)   semaglutide (OZEMPIC) 1 mg/dose (4 mg/3 mL) PnIj injection 1 mg, Subcutaneous, Every 7 days   sodium chloride (AYR) 0.65 % Drop 1 spray   spironolactone (ALDACTONE) 50 MG tablet 100 mg, Oral, Daily (standard)   syringe with needle 3 mL 23 x 1 Syrg Use as needed for Hydrocortisone injection   TRESIBA FLEXTOUCH U-100 100 unit/mL (3 mL) InPn 30 Units, Subcutaneous, At bedtime, Adjust as instructed.   zinc sulfate 110 mg (25 mg elemental zinc) Tab 110 mg, Oral, Daily      ALLERGIES:  Venom-honey bee    MEDICAL HISTORY:  Past Medical History:   Diagnosis Date   ??? AKI (acute kidney injury) (CMS-HCC) 12/14/2020   ??? Anxiety 10/22/2013   ??? Arthritis    ??? Cervical radiculopathy 12/03/2016   ??? Chronic pain disorder     Lower back   ???  Cirrhosis (CMS-HCC)    ??? Dental abscess 10/2020   ??? Duodenal ulcer 12/01/2017   ??? GERD (gastroesophageal reflux disease)    ??? History of transfusion    ??? Hyperlipidemia 10/22/2013   ??? Hypertension     under control with meds and weight loss   ??? Hypotension 01/29/2022   ??? Liver disease    ??? Sleep apnea, obstructive     Have machine   ??? Stroke (CMS-HCC)     mild stroke   ??? Type 2 diabetes mellitus with diabetic neuropathy, with long-term current use of insulin (CMS-HCC) 06/09/2014     Past Surgical History:   Procedure Laterality Date   ??? KNEE SURGERY     ??? PR CATH PLACE/CORON ANGIO, IMG SUPER/INTERP,R&L HRT CATH, L HRT VENTRIC N/A 12/19/2020    Procedure: CATH LEFT/RIGHT HEART CATHETERIZATION;  Surgeon: Rosana Hoes, MD;  Location: [facility] CATH;  Service: Cardiology   ??? PR COLONOSCOPY FLX DX W/COLLJ SPEC WHEN PFRMD N/A 12/07/2021    Procedure: COLONOSCOPY, FLEXIBLE, PROXIMAL TO SPLENIC FLEXURE; DIAGNOSTIC, W/WO COLLECTION SPECIMEN BY BRUSH OR WASH;  Surgeon: Annie Paras, MD;  Location: [facility];  Service: Gastroenterology   ??? PR UPPER GI ENDOSCOPY,BIOPSY N/A 10/12/2020 Procedure: UGI ENDOSCOPY; WITH BIOPSY, SINGLE OR MULTIPLE;  Surgeon: Marene Lenz, MD;  Location: [facility];  Service: Gastroenterology   ??? PR UPPER GI ENDOSCOPY,DIAGNOSIS N/A 01/03/2022    Procedure: UGI ENDO, INCLUDE ESOPHAGUS, STOMACH, & DUODENUM &/OR JEJUNUM; DX W/WO COLLECTION SPECIMN, BY BRUSH OR WASH;  Surgeon: Marene Lenz, MD;  Location: [facility];  Service: Gastroenterology   ??? PR UPPER GI ENDOSCOPY,LIGAT VARIX N/A 12/07/2021    Procedure: UGI ENDO; W/BAND LIG ESOPH &/OR GASTRIC VARICES;  Surgeon: Annie Paras, MD;  Location: [facility];  Service: Gastroenterology   ??? ROOT CANAL      Front teeth     SOCIAL HISTORY  Social History     Social History Narrative   ??? Not on file      reports that he has never smoked. He has never used smokeless tobacco. He reports that he does not currently use alcohol. He reports that he does not currently use drugs.   FAMILY HISTORY  Family History   Problem Relation Age of Onset   ??? Edema Mother    ??? Alzheimer's disease Father    ??? Aortic dissection Brother    ??? Early death Brother    ??? Aneurysm Brother         Physical Exam:   Vitals:    01/29/22 2200 01/30/22 0306 01/30/22 0703 01/30/22 0802   BP:    117/56   Pulse:    97   Resp:    18   Temp:    35.8 ??C (96.5 ??F)   TempSrc:    Oral   SpO2: 96% (!) 88% 94% 93%   Weight:       Height:         I/O this shift:  In: -   Out: 300 [Urine:300]    Intake/Output Summary (Last 24 hours) at 01/30/2022 1151  Last data filed at 01/30/2022 0811  Gross per 24 hour   Intake 400 ml   Output 375 ml   Net 25 ml     General: chronically ill appearing, no acute distress  Heart: RRR, no m/r/g  Lungs: dim at bases, normal wob  Abd: soft, non-tender, non-distended  Ext:  trace edema

## 2022-01-30 NOTE — Unmapped (Signed)
Patient alert and oriented x 4  this shift.  Patient denies any pain or distress.  Patient BG under 180 this shift.  BP soft, especially when orthostatics done  MD made aware , albumin ordered.  Patient ambulated to hallway bathroom, expressing need to go often due to lactulose.  Patient moved to private room.  No needs voiced.     Problem: Adult Inpatient Plan of Care  Goal: Plan of Care Review  Outcome: Ongoing - Unchanged  Goal: Patient-Specific Goal (Individualized)  Outcome: Ongoing - Unchanged  Goal: Absence of Hospital-Acquired Illness or Injury  Outcome: Ongoing - Unchanged  Intervention: Identify and Manage Fall Risk  Recent Flowsheet Documentation  Taken 01/29/2022 0818 by Darrold Junker, RN  Safety Interventions: environmental modification  Intervention: Prevent Skin Injury  Recent Flowsheet Documentation  Taken 01/29/2022 0818 by Darrold Junker, RN  Skin Protection: adhesive use limited  Intervention: Prevent Infection  Recent Flowsheet Documentation  Taken 01/29/2022 0818 by Darrold Junker, RN  Infection Prevention: environmental surveillance performed  Goal: Optimal Comfort and Wellbeing  Outcome: Ongoing - Unchanged  Goal: Readiness for Transition of Care  Outcome: Ongoing - Unchanged  Goal: Rounds/Family Conference  Outcome: Ongoing - Unchanged

## 2022-01-30 NOTE — Unmapped (Signed)
Pt unable to tolerate CPAP at this time..  Will be placed on 2L [place] overnight

## 2022-01-30 NOTE — Unmapped (Signed)
Hepatology Consult Service   Progress Note         Assessment & Plan:   Matthew Key is a 61 y.o. male with a PMHx of cryptogenic cirrhosis c/b ascites, HE, SBP, and non-bleeding EVs who is actively listed for liver transplant, hypertension, T2DM, hyperlipidemia, OSA on CPAP that presented to [facility] with hypotension and hepatic encephalopathy.     Decompensated Cryptogenic Cirrhosis, HE: He remains about the same today, slightly more encephalopathic this morning (but I had woken him up from sleep). We suspect poor PO intake leading to dehydration/AKI/electrolyte disturbances was the primary driver of this episode. US liver doppler completed without evidence of PVT.   -- Lactulose, titrated to 3-5 BMs daily  -- Rifaximin 550mg  BID  -- Ciprofloxacin 500mg  PO daily (secondary SBP prophylaxis)  -- Added 2g-sodium restriction to his diet order     Acute Kidney Injury: Somewhat difficult to ascertain his baseline SCr, likely in the 1-1.4 range, elevated to 2.6 on admission and now stable at 2.7. He has received 24 hours of an albumin challenge so far without appreciable improvement. We will need to see how today goes in regards to UOP and his SCr trend. If he does not have meaningful improvement, we would consider terlipressin for him starting tomorrow to treat HRS. UNa was checked today and is detectable at 12, but hard to interpret this in the setting of mid-albumin challenge.   -- Continue albumin 25% 1g/kg daily through today  -- Hold further IVF/crystalloid boluses  -- Repeat urine sodium in the AM  -- Primary team inquired about midodrine, would consider if MAPs consistently running <60  -- Consult Nephrology, we would consider him a terlipressin candidate if he continues to not have improvement into tomorrow    I have communicated recommendations with the patient's primary team    Thank you for involving [place] in the care of your patient. We will continue to follow along with you.     For questions, contact the on-call fellow for the Hepatology Consult Service at [phone].     Interval History:   No acute events overnight. Slept ok overnight. No pain. Reports his urine is light yellow in color. Reports BM x1 already today.     Objective:   Temp:  [35.8 ??C (96.5 ??F)-37 ??C (98.6 ??F)] 35.8 ??C (96.5 ??F)  Heart Rate:  [80-112] 97  Resp:  [16-18] 18  BP: (87-118)/(45-70) 117/56  FiO2 (%):  [30 %] 30 %  SpO2:  [88 %-97 %] 93 %    Gen: Chronically ill-appearing male in NAD, answers questions appropriately  Eyes: Sclera anicteric  Abdomen: Soft, NTND, no rebound/guarding  Neuro: Normal speech. +Asterixis.  Psych: Alert, normal mood and affect.     Pertinent Labs/Studies Reviewed:  MELD-Na: 25 at 01/30/2022  7:13 AM  MELD: 25 at 01/30/2022  7:13 AM  Calculated from:  Serum Creatinine: 2.68 mg/dL at 4/54/0981  1:91 AM  Serum Sodium: 136 mmol/L at 01/30/2022  7:13 AM  Total Bilirubin: 2.6 mg/dL at 4/78/2956  2:13 AM  INR(ratio): 1.57 at 01/28/2022  4:31 PM

## 2022-01-30 NOTE — Unmapped (Signed)
Patient A&Ox4. VSS on room air. No acute events overnight. Patient NPO from 1600. Liver US completed at 0000. Patient attempted to wear CPAP, but unable to tolerate. Patient placed on 2L  with no reports of desatting. Continuous pulse ox in place. Patient strict I&Os. Call bell and tray table within reach. Bed locked and in lowest position. Will continue to monitor.     Problem: Adult Inpatient Plan of Care  Goal: Plan of Care Review  01/30/2022 0656 by Jodi Mourning, RN  Outcome: Ongoing - Unchanged  01/30/2022 0656 by Jodi Mourning, RN  Outcome: Ongoing - Unchanged  Goal: Patient-Specific Goal (Individualized)  01/30/2022 0656 by Jodi Mourning, RN  Outcome: Ongoing - Unchanged  01/30/2022 0656 by Jodi Mourning, RN  Outcome: Ongoing - Unchanged  Goal: Absence of Hospital-Acquired Illness or Injury  01/30/2022 0656 by Jodi Mourning, RN  Outcome: Ongoing - Unchanged  01/30/2022 0656 by Jodi Mourning, RN  Outcome: Ongoing - Unchanged  Goal: Optimal Comfort and Wellbeing  01/30/2022 0656 by Jodi Mourning, RN  Outcome: Ongoing - Unchanged  01/30/2022 0656 by Jodi Mourning, RN  Outcome: Ongoing - Unchanged  Goal: Readiness for Transition of Care  01/30/2022 0656 by Jodi Mourning, RN  Outcome: Ongoing - Unchanged  01/30/2022 0656 by Jodi Mourning, RN  Outcome: Ongoing - Unchanged  Goal: Rounds/Family Conference  01/30/2022 0656 by Jodi Mourning, RN  Outcome: Ongoing - Unchanged  01/30/2022 0656 by Jodi Mourning, RN  Outcome: Ongoing - Unchanged     Problem: Fall Injury Risk  Goal: Absence of Fall and Fall-Related Injury  01/30/2022 0656 by Jodi Mourning, RN  Outcome: Ongoing - Unchanged  01/30/2022 0656 by Jodi Mourning, RN  Outcome: Ongoing - Unchanged     Problem: Fluid Volume Excess  Goal: Fluid Balance  01/30/2022 0656 by Jodi Mourning, RN  Outcome: Ongoing - Unchanged  01/30/2022 0656 by Jodi Mourning, RN  Outcome: Ongoing - Unchanged

## 2022-01-31 LAB — CBC
HEMATOCRIT: 24.6 % — ABNORMAL LOW (ref 39.0–48.0)
HEMOGLOBIN: 8.7 g/dL — ABNORMAL LOW (ref 12.9–16.5)
MEAN CORPUSCULAR HEMOGLOBIN CONC: 35.5 g/dL (ref 32.0–36.0)
MEAN CORPUSCULAR HEMOGLOBIN: 35.6 pg — ABNORMAL HIGH (ref 25.9–32.4)
MEAN CORPUSCULAR VOLUME: 100.4 fL — ABNORMAL HIGH (ref 77.6–95.7)
MEAN PLATELET VOLUME: 10.6 fL (ref 6.8–10.7)
PLATELET COUNT: 55 10*9/L — ABNORMAL LOW (ref 150–450)
RED BLOOD CELL COUNT: 2.45 10*12/L — ABNORMAL LOW (ref 4.26–5.60)
RED CELL DISTRIBUTION WIDTH: 16.3 % — ABNORMAL HIGH (ref 12.2–15.2)
WBC ADJUSTED: 4.9 10*9/L (ref 3.6–11.2)

## 2022-01-31 LAB — COMPREHENSIVE METABOLIC PANEL
ALBUMIN: 3.6 g/dL (ref 3.4–5.0)
ALKALINE PHOSPHATASE: 207 U/L — ABNORMAL HIGH (ref 46–116)
ALT (SGPT): 25 U/L (ref 10–49)
ANION GAP: 6 mmol/L (ref 5–14)
AST (SGOT): 35 U/L — ABNORMAL HIGH (ref <=34)
BILIRUBIN TOTAL: 3.7 mg/dL — ABNORMAL HIGH (ref 0.3–1.2)
BLOOD UREA NITROGEN: 28 mg/dL — ABNORMAL HIGH (ref 9–23)
BUN / CREAT RATIO: 18
CALCIUM: 8.9 mg/dL (ref 8.7–10.4)
CHLORIDE: 111 mmol/L — ABNORMAL HIGH (ref 98–107)
CO2: 22 mmol/L (ref 20.0–31.0)
CREATININE: 1.53 mg/dL — ABNORMAL HIGH
EGFR CKD-EPI (2021) MALE: 51 mL/min/{1.73_m2} — ABNORMAL LOW (ref >=60)
GLUCOSE RANDOM: 142 mg/dL (ref 70–179)
POTASSIUM: 4.6 mmol/L (ref 3.4–4.8)
PROTEIN TOTAL: 5.6 g/dL — ABNORMAL LOW (ref 5.7–8.2)
SODIUM: 139 mmol/L (ref 135–145)

## 2022-01-31 MED ORDER — SPIRONOLACTONE 50 MG TABLET
ORAL_TABLET | Freq: Every day | ORAL | 0 refills | 30.00000 days | Status: CP
Start: 2022-01-31 — End: ?

## 2022-01-31 MED ADMIN — zinc sulfate (ZINCATE) capsule 220 mg: 220 mg | ORAL | @ 13:00:00 | Stop: 2022-01-31

## 2022-01-31 MED ADMIN — melatonin tablet 3 mg: 3 mg | ORAL

## 2022-01-31 MED ADMIN — rifAXIMin (XIFAXAN) tablet 550 mg: 550 mg | ORAL | @ 13:00:00 | Stop: 2022-01-31

## 2022-01-31 MED ADMIN — heparin (porcine) 5,000 unit/mL injection 5,000 Units: 5000 [IU] | SUBCUTANEOUS | @ 01:00:00

## 2022-01-31 MED ADMIN — gabapentin (NEURONTIN) capsule 300 mg: 300 mg | ORAL

## 2022-01-31 MED ADMIN — lactulose oral solution: 20 g | ORAL | @ 13:00:00 | Stop: 2022-01-31

## 2022-01-31 MED ADMIN — insulin lispro (HumaLOG) injection 0-20 Units: 0-20 [IU] | SUBCUTANEOUS | @ 01:00:00

## 2022-01-31 MED ADMIN — lactulose oral solution: 20 g | ORAL

## 2022-01-31 MED ADMIN — heparin (porcine) 5,000 unit/mL injection 5,000 Units: 5000 [IU] | SUBCUTANEOUS | @ 11:00:00 | Stop: 2022-01-31

## 2022-01-31 MED ADMIN — rifAXIMin (XIFAXAN) tablet 550 mg: 550 mg | ORAL | Stop: 2022-02-11

## 2022-01-31 MED ADMIN — atorvastatin (LIPITOR) tablet 10 mg: 10 mg | ORAL

## 2022-01-31 MED ADMIN — ciprofloxacin HCl (CIPRO) tablet 500 mg: 500 mg | ORAL | @ 13:00:00 | Stop: 2022-01-31

## 2022-01-31 NOTE — Unmapped (Signed)
A/O x 4, afebrile, VSS on RA, denies pain at this time. IV clean, dry, intact, and flushed per protocol. VTE: Heparin SQ. Falls precautions maintained. Call bell and bedside table within reach, bed in lowest position and locked. No falls or injuries noted this shift. Will continue with plan of care.    Problem: Adult Inpatient Plan of Care  Goal: Plan of Care Review  Outcome: Progressing  Goal: Patient-Specific Goal (Individualized)  Outcome: Progressing  Flowsheets (Taken 01/31/2022 1132)  Patient-Specific Goals (Include Timeframe): Free from falls or injuries through discharge  Individualized Care Needs: Falls, labs, VS, AC/HS  Anxieties, Fears or Concerns: None expressed  Goal: Absence of Hospital-Acquired Illness or Injury  Outcome: Progressing  Goal: Optimal Comfort and Wellbeing  Outcome: Progressing  Goal: Readiness for Transition of Care  Outcome: Progressing  Goal: Rounds/Family Conference  Outcome: Progressing     Problem: Fall Injury Risk  Goal: Absence of Fall and Fall-Related Injury  Outcome: Progressing     Problem: Fluid Volume Excess  Goal: Fluid Balance  Outcome: Progressing

## 2022-01-31 NOTE — Unmapped (Signed)
Patient alert and oriented x 4, vss during the night. Denies any pain, discomfort. Patient refuses bed alarm for safety measures . Bed in lowest position, side rails up x 2, call bell within reach. Will continue to monitor.      Problem: Adult Inpatient Plan of Care  Goal: Plan of Care Review  Outcome: Progressing  Goal: Patient-Specific Goal (Individualized)  Outcome: Progressing  Goal: Absence of Hospital-Acquired Illness or Injury  Outcome: Progressing  Intervention: Identify and Manage Fall Risk  Recent Flowsheet Documentation  Taken 01/31/2022 0202 by Magalene Mclear, RN  Safety Interventions:   fall reduction program maintained   low bed   environmental modification  Taken 01/31/2022 0007 by Franchelle Foskett, RN  Safety Interventions:   fall reduction program maintained   environmental modification  Taken 01/30/2022 2200 by Daphyne Miguez, RN  Safety Interventions:   environmental modification   low bed   fall reduction program maintained  Taken 01/30/2022 2033 by Mckinzey Entwistle, RN  Safety Interventions:   bed alarm   environmental modification   low bed  Goal: Optimal Comfort and Wellbeing  Outcome: Progressing  Goal: Readiness for Transition of Care  Outcome: Progressing  Goal: Rounds/Family Conference  Outcome: Progressing

## 2022-01-31 NOTE — Unmapped (Signed)
Hepatology Consult Service   Progress Note         Assessment & Plan:   Matthew Key is a 61 y.o. male with a PMHx of cryptogenic cirrhosis c/b ascites, HE, SBP, and non-bleeding EVs who is actively listed for liver transplant, hypertension, T2DM, hyperlipidemia, OSA on CPAP that presented to [facility] with hypotension and hepatic encephalopathy course c/b AKI which has improved.     Decompensated Cryptogenic Cirrhosis, HE:   No evidence of encephalopathy on exam today. We suspect poor PO intake leading to dehydration/AKI/electrolyte disturbances was the primary driver of this episode of HE. US liver doppler completed without evidence of PVT.   -- Lactulose, titrated to 3-5 BMs daily  -- Rifaximin 550mg  BID  -- Zinc 220mg  daily   -- Ciprofloxacin 500mg  PO daily (secondary SBP prophylaxis)  -- Sodium restricted diet      Acute Kidney Injury: Somewhat difficult to ascertain his baseline SCr, likely in the 1-1.4 range, elevated to 2.6 on admission and has now markedly improved with two-day albumin challenge. Nephrology is following and plans to re-start lower dose diuretics with plan for close outpatient lab followup.   -- Appreciate nephrology recs  -- Restart lasix 40mg  and spironolactone 50mg  daily   -- S/p albumin challenge (6/20-6/21)    -- Repeat labs early next week      Thank you for involving [place] in the care of your patient. We will continue to follow along with you.     For questions, contact the on-call fellow for the Hepatology Consult Service at [phone].     Interval History:     NAEON  Feels well and is eager to go home    Objective:   Temp:  [35.9 ??C (96.7 ??F)] 35.9 ??C (96.7 ??F)  Heart Rate:  [95] 95  Resp:  [17] 17  BP: (130)/(59) 130/59  SpO2:  [95 %-97 %] 97 %    Gen: Chronically ill-appearing male in NAD, answers questions appropriately  Eyes: Sclera anicteric  Abdomen: Soft, NTND, no rebound/guarding  Neuro: Normal speech. No asterixis.   Psych: Alert, normal mood and affect.     Pertinent Labs/Studies Reviewed:  Lab Results   Component Value Date    WBC 4.0 01/30/2022    HGB 8.4 (L) 01/30/2022    HCT 23.7 (L) 01/30/2022    PLT 47 (L) 01/30/2022       Lab Results   Component Value Date    NA 136 01/30/2022    K 4.6 01/30/2022    CL 106 01/30/2022    CO2 20.0 01/30/2022    BUN 41 (H) 01/30/2022    CREATININE 2.68 (H) 01/30/2022    GLU 203 (H) 01/30/2022    CALCIUM 8.2 (L) 01/30/2022    MG 1.5 (L) 01/14/2022    PHOS 5.2 (H) 01/28/2022       Lab Results   Component Value Date    BILITOT 2.6 (H) 01/30/2022    BILIDIR 1.40 (H) 06/24/2021    PROT 5.0 (L) 01/30/2022    ALBUMIN 2.8 (L) 01/30/2022    ALT 28 01/30/2022    AST 41 (H) 01/30/2022    ALKPHOS 213 (H) 01/30/2022    GGT 19 01/14/2022       Lab Results   Component Value Date    PT 18.1 (H) 01/28/2022    INR 1.57 01/28/2022    APTT 32.8 01/28/2022

## 2022-01-31 NOTE — Unmapped (Signed)
Nephrology Consult Note    Requesting Attending Physician :  Janell Quiet Mock, MD  Service Requesting Consult : Med General Doristine Counter ([facility])  Reason for Consult: AKI    Assessment: Matthew Key is a 61 y.o. male who presented to [facility] with hypotension and AMS. Nephrology has been consulted for AKI.    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 Markedly hypotensive in clinic- suspect hypoperfusion injury likely a significant driver on top of this HRS physiology (and urine sediment reviewed 6/21 was bland which supports this rather than intrinsic injury or ATN). Urine Na of 12 despite diuretics likely represents sodium avidity related to hypovoelmia and HRS physiology. UA with hyaline casts which is consistent with low EABV state. Around one month ago his diuretic was increased from lasix/spiro 40/50 to 60/100; otherwise, unclear driver of hypotension- considering lactulose-related diarrhea, infection, or other driver;   - sCr close to baseline now, euvolemic on my exam.  - Restart lasix 40 / Cleda Daub 50 once daily tomorrow  - DC home lisinopril  - Hypotension workup per primary team  - Priority should be given to maintaining MAPs > 65    # Decompensated Cirrhosis   - Evaluation and management per primary team  - No changes to management from a nephrology standpoint at this time    RECOMMENDATIONS:   - Continue supportive care  - Restart diuretics tomorrow as above  - DC home lisinopril  - Please keep MAPs > 65  - We will sign-off.     Discussed recommendations with primary team.    Elmer Picker, MD  01/31/2022 10:32 AM  _____________________________________________________________________________________    Interval Hx / Subjective: Feeling OK- bummed about not getting breakfast.    Physical Exam:   Vitals:    01/30/22 2200 01/31/22 0225 01/31/22 0700 01/31/22 0800   BP:    114/56   Pulse:   95 95 Resp:       Temp:    35.8 ??C (96.4 ??F)   TempSrc:    Oral   SpO2: 95% 97% 98% 96%   Weight:       Height:         I/O this shift:  In: 489 [P.O.:489]  Out: -     Intake/Output Summary (Last 24 hours) at 01/31/2022 1032  Last data filed at 01/31/2022 1000  Gross per 24 hour   Intake 969 ml   Output 800 ml   Net 169 ml     General: chronically ill appearing, no acute distress  Lungs: nl wob  Abd: soft, non-tender, non-distended  Ext: trace edema

## 2022-01-31 NOTE — Unmapped (Signed)
Vital signs stable. Denies any pain. Uses urinal with adequate urine out put. Stand by assist. Fall precautions in place. Will continue to monitor.     Problem: Adult Inpatient Plan of Care  Goal: Plan of Care Review  Outcome: Progressing  Goal: Patient-Specific Goal (Individualized)  Outcome: Progressing  Goal: Absence of Hospital-Acquired Illness or Injury  Outcome: Progressing  Intervention: Identify and Manage Fall Risk  Recent Flowsheet Documentation  Taken 01/30/2022 0730 by Merlyn Albert, RN  Safety Interventions:   fall reduction program maintained   lighting adjusted for tasks/safety   low bed   nonskid shoes/slippers when out of bed  Intervention: Prevent Skin Injury  Recent Flowsheet Documentation  Taken 01/30/2022 0900 by Merlyn Albert, RN  Skin Protection: adhesive use limited  Intervention: Prevent and Manage VTE (Venous Thromboembolism) Risk  Recent Flowsheet Documentation  Taken 01/30/2022 0900 by Merlyn Albert, RN  VTE Prevention/Management: anticoagulant therapy  Intervention: Prevent Infection  Recent Flowsheet Documentation  Taken 01/30/2022 0730 by Merlyn Albert, RN  Infection Prevention: hand hygiene promoted  Goal: Optimal Comfort and Wellbeing  Outcome: Progressing  Goal: Readiness for Transition of Care  Outcome: Progressing  Goal: Rounds/Family Conference  Outcome: Progressing     Problem: Fall Injury Risk  Goal: Absence of Fall and Fall-Related Injury  Outcome: Progressing  Intervention: Promote Injury-Free Environment  Recent Flowsheet Documentation  Taken 01/30/2022 0730 by Merlyn Albert, RN  Safety Interventions:   fall reduction program maintained   lighting adjusted for tasks/safety   low bed   nonskid shoes/slippers when out of bed     Problem: Fluid Volume Excess  Goal: Fluid Balance  Outcome: Progressing  Intervention: Monitor and Manage Hypervolemia  Recent Flowsheet Documentation  Taken 01/30/2022 0900 by Merlyn Albert, RN  Skin Protection: adhesive use limited

## 2022-01-31 NOTE — Unmapped (Signed)
Physician Discharge Summary [facility]  [address] [facility]  [address]  [place] [address]  Dept: [phone]  Loc: [phone]     Identifying Information:   Matthew Key  03-21-1961  [id number]    Primary Care Physician: Loman [place], MD     Code Status: Full Code    Admit Date: 01/28/2022    Discharge Date: 01/31/2022     Discharge To: Home with Home Health and/or PT/OT    Discharge Service: [facility] - General Medicine Floor Team (MED U - Tower)     Discharge Attending Physician: Janell Quiet Mock, MD    Discharge Diagnoses:  Principal Problem:    Hypotension POA: Yes  Active Problems:    Hyponatremia POA: Yes    Non-alcoholic micronodular cirrhosis of liver (CMS-HCC) POA: Yes    OSA on CPAP POA: Not Applicable    Thrombocytopenia (CMS-HCC) POA: Yes    Type 2 diabetes mellitus without complication (CMS-HCC) POA: Yes    Anemia POA: Yes    AKI (acute kidney injury) (CMS-HCC) POA: Yes    Esophageal varices determined by endoscopy (CMS-HCC) POA: Yes    Gastroesophageal reflux disease POA: Yes    Hyperlipidemia POA: Yes    Hyperkalemia POA: Yes  Resolved Problems:    * No resolved hospital problems. *      Outpatient Provider Follow Up Issues:   Labs Monday, June 26    Hospital Course:       Matthew Key is a 61 y.o. male whose presentation is complicated by cryptogenic cirrhosis c/b ascites, nonbleeding EV, adrenal insufficiency, hx SBP, planning splenorenal shunt embolization currently listed for LT, CVA, OSA on CPAP, T2DM, HTN, HLD, hx [place]er[place]aves syn 2/2 endoscopic repair ~20 years ago who presented to [facility] from GI clinic with hypotension and c/f infection.  He was evaluated by hepatology and nephrology and now stable for dc home with plan for close hepatology follow up.  Below are more details of his stay at [facility] according to problem;      Hypotension   Baseline BP typically 90-100s/50-60s. In GI clinic 6/20 with BP 66/31 > 75/34 after IVF.  Lactate 2.8, though afebrile and WBC wnl. UA bland.  CXR with subsegmental atelectasis and mild blunting of L costophrenic angle ?possibly small pleural effusion though patient denies any shortness of breath, saturating appropriately on room air and afebrile. Was given IV fluids and diuretics held.  Liver doppler with no evidence of PVT and infectious workup negative.  Cortisol 10. Ultimately, BP has improved after hydration and more likely hypotension related to volume.      Decompensated Cryptogenic Cirrhosis - Hx Esophageal Varices and SBP   Follows with Dr. Waynetta Sandy, seen in clinic on day of admit and c/f hypotension as above. Actively listed for LT. History of HE, non-bleeding G2 esophageal varices (last EGD 12/07/21). Main issues has been recurrent and refractory HE despite adequate treatment. Ammonia mildly elevated at 42, but no asterixis on exam and A&Ox4. Continued lactulose / rifaximin and held home spiro 100 mg daily and lasix.  Hepatology consulted and recs for nephrology to help guide AKI as below.  Following with [facility]IR as outpatient for splenorenal shunt. Will continue to get weekly labs with transplant team following.      Acute Kidney Injury  Patients creatinine 2.61 on admission. Baseline has been quite volatile lately, previously 1-1.2 but not stable over last several months. Most recent Cr 1.71 on 6/12. Reports adequate UOP.  Most likely  etiology is pre-renal based on history as above, possibly concerned for HRS or ATN.  Was given IV fluids with little improvement and then Albumin. Nephrology consulted and thought likely pre-renal and now improving.  Will decrease lasix from 60-->40mg /day and aldactone from 100-->50mg /day to start 6/23.  He will have follow up labs in 4 days with transplant following.       Acute on Chronic Hyponatremia   Complication of decompensated cirrhosis. Exacerbations typically result in worsening hyponatremia frequently down to mid 120s, but as low as 119.  Not on thiazide diuretic.  Likely hypovolemic hyponatremia iso hypotension and intravascular volume depletion with albumin 2.5.  Held diuretics as above and Na is now stable.  Will plan on diuretic changes as above.     Chronic Problems  OSA on CPAP: continue home CPAP  T2DM-controlled : Last A1c 4.7%, reports Matthew Key 40U daily but started with lower dose basal given AKI.  BG was fairly well controlled and will now dc with tresiba but hold ozempic until creatinine improved.    HTN: Held home lasix 60 mg, spironolactone, and lisinopril 2.5mg  daily iso hypotension and AKI.  Will now decrease diuretic as above and hold ACE-I for now.   HLD - Hx CVA: Cont home statin   Thrombocytopenia: Plts stable 71. Secondary to liver dx.          Touchbase with Outpatient Provider:  Warm Handoff: Completed on 01/30/22   by Mervin Kung  (APP) via Phone    Procedures:  None  No admission procedures for hospital encounter.  ______________________________________________________________________  Discharge Medications:     Your Medication List      STOP taking these medications    lisinopriL 2.5 MG tablet  Commonly known as: PRINIVIL,ZESTRIL     OZEMPIC 1 mg/dose (4 mg/3 mL) Pnij injection  Generic drug: semaglutide        CHANGE how you take these medications    furosemide 20 MG tablet  Commonly known as: LASIX  Take 2 tablets (40 mg total) by mouth daily. Restart on 6/23  Start taking on: February 01, 2022  What changed:   ?? how much to take  ?? additional instructions     spironolactone 50 MG tablet  Commonly known as: ALDACTONE  Take 1 tablet (50 mg total) by mouth daily. Restart 6/23  What changed:   ?? how much to take  ?? additional instructions        CONTINUE taking these medications    acetaminophen 325 MG tablet  Commonly known as: TYLENOL  Take 2 tablets (650 mg total) by mouth every six (6) hours as needed.     atorvastatin 10 MG tablet  Commonly known as: LIPITOR  Take 1 tablet (10 mg total) by mouth nightly.     ciprofloxacin HCl 500 MG tablet  Commonly known as: CIPRO  TAKE 1 TABLET BY MOUTH IN THE MORNING     gabapentin 300 MG capsule  Commonly known as: NEURONTIN  Take 1 capsule (300 mg total) by mouth at bedtime.     hydrOXYzine 50 MG capsule  Commonly known as: VISTARIL  Take 1 capsule (50 mg total) by mouth nightly as needed.     insulin syringe-needle U-100 1 mL 31 gauge x 5/16 (8 mm) Syrg  Use once as needed for Hydrocortisone injection.Marland Kitchen     lactulose 10 gram/15 mL solution  Take 30 mL (20 g total) by mouth Three (3) times a day.     ondansetron  4 MG disintegrating tablet  Commonly known as: ZOFRAN-ODT  TAKE 1 TABLET BY MOUTH EVERY 6 HOURS FOR 7 DAYS     ondansetron 4 MG disintegrating tablet  Commonly known as: ZOFRAN-ODT  Take 1 tablet (4 mg total) by mouth every eight (8) hours as needed for nausea.     pantoprazole 40 MG tablet  Commonly known as: PROTONIX  Take 1 tablet (40 mg total) by mouth daily.     pen needle, diabetic 32 gauge x 5/32 (4 mm) Ndle  Commonly known as: BD ULTRA-FINE NANO PEN NEEDLE  ok to sub any brand or size needle preferred by insurance/patient, use 1-2x/day, dx E11.65     sodium chloride 0.65 % Drop  Commonly known as: AYR  1 spray.     syringe with needle 3 mL 23 x 1 Syrg  Use as needed for Hydrocortisone injection     TRESIBA FLEXTOUCH U-100 100 unit/mL (3 mL) Inpn  Generic drug: insulin degludec  Inject 0.3 mL (30 Units total) under the skin at bedtime. Adjust as instructed.     XIFAXAN 550 mg Tab  Generic drug: rifAXIMin  Take 1 tablet (550 mg total) by mouth Two (2) times a day.     zinc sulfate 110 mg (25 mg elemental zinc) Tab  Take 1 tablet (110 mg total) by mouth in the morning.            Allergies:  Venom-honey bee  ______________________________________________________________________  Pending Test Results (if blank, then none):  Pending Labs     Order Current Status    Blood Culture #1 Preliminary result    Blood Culture #2 Preliminary result          Most Recent Labs:  All lab results last 24 hours -   Recent Results (from the past 24 hour(s))   POCT Glucose Collection Time: 01/30/22  5:59 PM   Result Value Ref Range    Glucose, POC 149 70 - 179 mg/dL   POCT Glucose    Collection Time: 01/30/22  8:25 PM   Result Value Ref Range    Glucose, POC 235 (H) 70 - 179 mg/dL   Comprehensive Metabolic Panel    Collection Time: 01/31/22  9:04 AM   Result Value Ref Range    Sodium 139 135 - 145 mmol/L    Potassium 4.6 3.4 - 4.8 mmol/L    Chloride 111 (H) 98 - 107 mmol/L    CO2 22.0 20.0 - 31.0 mmol/L    Anion Gap 6 5 - 14 mmol/L    BUN 28 (H) 9 - 23 mg/dL    Creatinine 1.47 (H) 0.60 - 1.10 mg/dL    BUN/Creatinine Ratio 18     eGFR CKD-EPI (2021) Male 51 (L) >=60 mL/min/1.81m2    Glucose 142 70 - 179 mg/dL    Calcium 8.9 8.7 - 82.9 mg/dL    Albumin 3.6 3.4 - 5.0 g/dL    Total Protein 5.6 (L) 5.7 - 8.2 g/dL    Total Bilirubin 3.7 (H) 0.3 - 1.2 mg/dL    AST 35 (H) <=56 U/L    ALT 25 10 - 49 U/L    Alkaline Phosphatase 207 (H) 46 - 116 U/L   CBC    Collection Time: 01/31/22  9:04 AM   Result Value Ref Range    WBC 4.9 3.6 - 11.2 10*9/L    RBC 2.45 (L) 4.26 - 5.60 10*12/L    HGB 8.7 (L) 12.9 - 16.5 g/dL  HCT 24.6 (L) 39.0 - 48.0 %    MCV 100.4 (H) 77.6 - 95.7 fL    MCH 35.6 (H) 25.9 - 32.4 pg    MCHC 35.5 32.0 - 36.0 g/dL    RDW 16.1 (H) 09.6 - 15.2 %    MPV 10.6 6.8 - 10.7 fL    Platelet 55 (L) 150 - 450 10*9/L   POCT Glucose    Collection Time: 01/31/22  9:04 AM   Result Value Ref Range    Glucose, POC 143 70 - 179 mg/dL   POCT Glucose    Collection Time: 01/31/22 12:56 PM   Result Value Ref Range    Glucose, POC 234 (H) 70 - 179 mg/dL       Relevant Studies/Radiology (if blank, then none):  US Liver Doppler    Result Date: 01/30/2022  EXAM: US LIVER DOPPLER DATE: 01/30/2022 12:57 AM ACCESSION: [id number] UN DICTATED: 01/30/2022 12:52 AM INTERPRETATION LOCATION: [facility]     CLINICAL INDICATION: 61 years old Male with Eval liver      COMPARISON: CT abdomen pelvis 08/25/2021, abdominal ultrasound 07/03/2021, Doppler liver ultrasound 06/30/2021     TECHNIQUE: Ultrasound views of the complete abdomen were obtained using grayscale, color Doppler, and spectral Doppler analysis.     FINDINGS:     LIVER: The liver was nodular and heterogeneous in echogenicity and echotexture. No focal hepatic lesions. No intrahepatic biliary ductal dilatation. The common bile duct was normal in caliber.      Liver: 11.8 cm      Common bile duct: 0.22 cm     GALLBLADDER: The gallbladder is physiologically distended with small, nonobstructive stones at the gallbladder neck. Sonographic Murphy sign was negative. No pericholecystic fluid. No gallbladder wall thickening.      Gallbladder wall: 0.3 cm     PANCREAS: Visualized portion was unremarkable.     SPLEEN: Mild splenomegaly.      Spleen: 14.2 cm     KIDNEYS: Normal in size and echotexture. No solid masses or calculi. No hydronephrosis.      Right kidney: 11.6 cm      Left kidney: 10.7 cm     VESSELS - Portal vein: The left portal vein was difficult to visualized but at least partially patent. The main and right portal veins were patent with hepatopetal flow. Normal main portal vein velocity (0.20 m/s or greater)      Main portal vein diameter: 1 cm          Main portal vein velocity: 0.35 m/s      Anterior right portal vein velocity: 0.26 m/s      Posterior right portal vein velocity: 0.2 m/s      Left portal vein velocity: 0.18 m/s          Main portal vein flow: hepatopetal      Right portal vein flow: hepatopetal      Left portal vein flow: hepatopetal     - Splenic vein: Patent, with hepatopetal flow.      Splenic vein midline: Non Vis      Splenic vein proximal: hepatopetal     - Hepatic veins/IVC: The hepatic veins were difficult to visualize but at least partially patent with biphasic waveforms. The IVC was patent with biphasic waveforms.      Left hepatic vein flow: biphasic      Middle hepatic vein flow: biphasic      Right hepatic vein flow: biphasic      Inferior  vena cava flow: biphasic     - Hepatic artery: Patent with color and spectral Doppler imaging     - Aorta:  Not well visualized due to abdominal bowel gas.     OTHER: Small volume upper abdominal ascites. Small right pleural effusion.         Nodular, shrunken, and heterogeneous appearing liver, compatible with cirrhosis.     Mild splenomegaly.     Somewhat poorly visualized hepatic vasculature, at least partially patent, with normal flow direction. No visualized thrombus.     Small volume abdominal ascites.    ______________________________________________________________________  Discharge Instructions:   Activity Instructions     Activity as tolerated            Diet Instructions     Discharge diet (specify)      Discharge Nutrition Therapy: Regular          Other Instructions     Call MD for:  difficulty breathing, headache or visual disturbances      Call MD for:  persistent nausea or vomiting      Call MD for:  severe uncontrolled pain      Call MD for: Temperature > 38.5 Celsius ( > 101.3 Fahrenheit)      Discharge instructions      Matthew Key, you were admitted to [facility] due to low blood pressure and worsening kidney function.  These are now improved after holding your lasix and aldactone and getting fluids.  We recommended a lower dose of the lasix and aldactone that can restart on Friday, June 23rd.  For now, hold on taking your lisinopril and semaglutide and talk to your providers about when to restart these in the future.  Continue to remain hydrated by drinking 2-3 liters of water a day and follow up closely with the transplant team.  Be sure to get your labs done on Monday, June 26th    Continue your other medications and blood sugar checks.               Follow Up instructions and Outpatient Referrals     Ambulatory referral to Home Health      Is this a [facility] or [facility] Patient?: No    Physician to follow patient's care: PCP    Disciplines requested: Physical Therapy    Physical Therapy requested: Evaluate and treat    Call MD for:  difficulty breathing, headache or visual disturbances      Call MD for:  persistent nausea or vomiting      Call MD for:  severe uncontrolled pain      Call MD for: Temperature > 38.5 Celsius ( > 101.3 Fahrenheit)      Discharge instructions          Appointments which have been scheduled for you    Feb 26, 2022 11:45 AM  (Arrive by 11:30 AM)  MRI ABDOMEN W WO CONTRA    -UN with [facility] MRI RM 1  IMG MRI [facility]) [address]  [place] [address]  [phone]   On appt date:  Bring recent lab work  Bring documentation of any metal object implants  Take meds as usual  Check w/physician if diabetic  You will be asked to change into a gown for your safety    On appt date do not:  Consume anything 2 hrs  Wear metallic items including jewelry (we are not responsible for lost items)    Let  us know if pt:  Claustrophobic  Metal object implant  Pregnant  Prescribed a sedative  On dialysis  Allergic to MRI dye/contrast  Kidney Failure    (Title:MRIWCNTRST)     Mar 08, 2022 12:00 PM  (Arrive by 11:00 AM)  IR EMBOLIZATION  VENOUS OTHER THAN HEMORRHAGE with [facility] IR 4  IMG VASCULAR INTERVENTIONAL H&V [facility]) [address]  [place] [address]  [phone]   On appt date:  Come with adult to accompany pt home  Bring recent lab work  Bring any meds you take  Take meds w/small sip of water  Check w/physician about current meds  Check w/physician if diabetic  Check w/physician if pt takes blood thinners  Arrive 1 hr early    On appt date do not:  Consume solids after midnight  Consume anything 2 hrs    Let us know if pt:  Pregnant  Allergic to iodine or contrast dyes  Prior arterial or vascular graft operations  History of unstable angina  (Title:IRGEN)     Mar 25, 2022 12:00 PM  (Arrive by 11:30 AM)  RETURN 15 with Chirag Lanney Gins, MD  [facility] TRANSPLANT SURGERY [facility]) [address]  [place] [address]  [phone] ______________________________________________________________________  Discharge Day Services:  BP 114/56  - Pulse 95  - Temp 35.8 ??C (96.4 ??F) (Oral)  - Resp 17  - Ht 172.7 cm (5' 8)  - Wt 96.9 kg (213 lb 11.2 oz)  - SpO2 96%  - BMI 32.49 kg/m??     Pt seen on the day of discharge and determined appropriate for discharge.    Condition at Discharge: stable    Length of Discharge: I spent greater than 30 mins in the discharge of this patient.

## 2022-02-01 MED ORDER — FUROSEMIDE 20 MG TABLET
ORAL_TABLET | Freq: Every day | ORAL | 0 refills | 30.00000 days | Status: CP
Start: 2022-02-01 — End: ?

## 2022-02-04 DIAGNOSIS — R197 Diarrhea, unspecified: Principal | ICD-10-CM

## 2022-02-04 MED ORDER — PANTOPRAZOLE 40 MG TABLET,DELAYED RELEASE
ORAL_TABLET | Freq: Every day | ORAL | 2 refills | 30 days | Status: CP
Start: 2022-02-04 — End: 2022-05-05

## 2022-02-04 NOTE — Unmapped (Signed)
Patient reports he still has about 90 days left of Xifaxan. He reports he waited some time to start, and spoke with [name] before starting therapy. We agreed I would call back in about 2 and 1/2 months. Patient agreed to call us back before he gets down to 7 days left of medication. No questions for the pharmacist.

## 2022-02-05 ENCOUNTER — Ambulatory Visit: Admit: 2022-02-05 | Payer: MEDICARE

## 2022-02-05 ENCOUNTER — Encounter: Admit: 2022-02-05 | Payer: MEDICARE

## 2022-02-05 ENCOUNTER — Ambulatory Visit
Admit: 2022-02-05 | Discharge: 2022-02-15 | Disposition: A | Discharge status: Home Health | Payer: MEDICARE | Admitting: Surgery

## 2022-02-05 ENCOUNTER — Encounter: Admit: 2022-02-05 | Payer: MEDICARE | Attending: Certified Registered"

## 2022-02-05 ENCOUNTER — Ambulatory Visit: Admit: 2022-02-05 | Discharge: 2022-02-15 | Payer: MEDICARE

## 2022-02-05 ENCOUNTER — Encounter: Admit: 2022-02-05 | Discharge: 2022-02-05 | Payer: MEDICARE

## 2022-02-05 ENCOUNTER — Encounter
Admit: 2022-02-05 | Discharge: 2022-02-15 | Disposition: A | Discharge status: Home / Self Care | Payer: MEDICARE | Attending: Student in an Organized Health Care Education/Training Program | Admitting: Surgery

## 2022-02-05 LAB — LACTATE DEHYDROGENASE: LACTATE DEHYDROGENASE: 172 U/L (ref 120–246)

## 2022-02-05 LAB — CBC W/ AUTO DIFF
BASOPHILS ABSOLUTE COUNT: 0 10*9/L (ref 0.0–0.1)
BASOPHILS RELATIVE PERCENT: 0.6 %
EOSINOPHILS ABSOLUTE COUNT: 0.1 10*9/L (ref 0.0–0.5)
EOSINOPHILS RELATIVE PERCENT: 2.2 %
HEMATOCRIT: 24 % — ABNORMAL LOW (ref 39.0–48.0)
HEMOGLOBIN: 8.5 g/dL — ABNORMAL LOW (ref 12.9–16.5)
LYMPHOCYTES ABSOLUTE COUNT: 1.1 10*9/L (ref 1.1–3.6)
LYMPHOCYTES RELATIVE PERCENT: 27.8 %
MEAN CORPUSCULAR HEMOGLOBIN CONC: 35.4 g/dL (ref 32.0–36.0)
MEAN CORPUSCULAR HEMOGLOBIN: 35.6 pg — ABNORMAL HIGH (ref 25.9–32.4)
MEAN CORPUSCULAR VOLUME: 100.5 fL — ABNORMAL HIGH (ref 77.6–95.7)
MEAN PLATELET VOLUME: 10.5 fL (ref 6.8–10.7)
MONOCYTES ABSOLUTE COUNT: 0.8 10*9/L (ref 0.3–0.8)
MONOCYTES RELATIVE PERCENT: 19.7 %
NEUTROPHILS ABSOLUTE COUNT: 2 10*9/L (ref 1.8–7.8)
NEUTROPHILS RELATIVE PERCENT: 49.7 %
PLATELET COUNT: 59 10*9/L — ABNORMAL LOW (ref 150–450)
RED BLOOD CELL COUNT: 2.39 10*12/L — ABNORMAL LOW (ref 4.26–5.60)
RED CELL DISTRIBUTION WIDTH: 16.1 % — ABNORMAL HIGH (ref 12.2–15.2)
WBC ADJUSTED: 4 10*9/L (ref 3.6–11.2)

## 2022-02-05 LAB — COMPREHENSIVE METABOLIC PANEL
A/G RATIO: 1.4 (ref 1.2–2.2)
ALBUMIN: 3.5 g/dL — ABNORMAL LOW (ref 3.8–4.8)
ALBUMIN: 3.3 g/dL — ABNORMAL LOW (ref 3.4–5.0)
ALKALINE PHOSPHATASE: 208 IU/L — ABNORMAL HIGH (ref 44–121)
ALKALINE PHOSPHATASE: 159 U/L — ABNORMAL HIGH (ref 46–116)
ALT (SGPT): 24 IU/L (ref 0–44)
ALT (SGPT): 23 U/L (ref 10–49)
ANION GAP: 6 mmol/L (ref 5–14)
AST (SGOT): 34 IU/L (ref 0–40)
AST (SGOT): 35 U/L — ABNORMAL HIGH (ref <=34)
BILIRUBIN TOTAL (MG/DL) IN SER/PLAS: 2.6 mg/dL — ABNORMAL HIGH (ref 0.0–1.2)
BILIRUBIN TOTAL: 4.8 mg/dL — ABNORMAL HIGH (ref 0.3–1.2)
BLOOD UREA NITROGEN: 21 mg/dL (ref 8–27)
BLOOD UREA NITROGEN: 23 mg/dL (ref 9–23)
BUN / CREAT RATIO: 14 (ref 10–24)
BUN / CREAT RATIO: 16
CALCIUM: 8.6 mg/dL (ref 8.6–10.2)
CALCIUM: 8.9 mg/dL (ref 8.7–10.4)
CHLORIDE: 102 mmol/L (ref 96–106)
CHLORIDE: 107 mmol/L (ref 98–107)
CO2: 24 mmol/L (ref 20–29)
CO2: 24 mmol/L (ref 20.0–31.0)
CREATININE: 1.45 mg/dL — ABNORMAL HIGH (ref 0.76–1.27)
CREATININE: 1.46 mg/dL — ABNORMAL HIGH
EGFR CKD-EPI (2021) MALE: 54 mL/min/{1.73_m2} — ABNORMAL LOW (ref >=60)
GLOBULIN, TOTAL: 2.5 g/dL (ref 1.5–4.5)
GLUCOSE RANDOM: 137 mg/dL (ref 70–179)
GLUCOSE: 246 mg/dL — ABNORMAL HIGH (ref 70–99)
POTASSIUM: 4.7 mmol/L (ref 3.5–5.2)
POTASSIUM: 4.1 mmol/L (ref 3.4–4.8)
PROTEIN TOTAL: 5.7 g/dL (ref 5.7–8.2)
SODIUM: 136 mmol/L (ref 134–144)
SODIUM: 137 mmol/L (ref 135–145)
TOTAL PROTEIN: 6 g/dL (ref 6.0–8.5)

## 2022-02-05 LAB — FACTOR 7 ACTIVITY: FACTOR VII ACTIVITY: 20 % — ABNORMAL LOW (ref 61–146)

## 2022-02-05 LAB — PHOSPHORUS: PHOSPHORUS: 3.7 mg/dL (ref 2.4–5.1)

## 2022-02-05 LAB — ANTITHROMBIN III: ANTITHROMB III, FUNC: 20 % — CL (ref 80–130)

## 2022-02-05 LAB — APTT
APTT: 40.4 s — ABNORMAL HIGH (ref 25.1–36.5)
HEPARIN CORRELATION: 0.2

## 2022-02-05 LAB — PROTIME-INR
INR: 1.6 — ABNORMAL HIGH (ref 0.9–1.2)
INR: 1.97
PROTHROMBIN TIME: 16.6 s — ABNORMAL HIGH (ref 9.1–12.0)
PROTIME: 22.9 s — ABNORMAL HIGH (ref 9.8–12.8)

## 2022-02-05 LAB — FACTOR II ACTIVITY: FACTOR II ACTIVITY: 25 % — ABNORMAL LOW (ref 77–131)

## 2022-02-05 LAB — HEMOGLOBIN A1C
ESTIMATED AVERAGE GLUCOSE: 103 mg/dL
HEMOGLOBIN A1C: 5.2 % (ref 4.8–5.6)

## 2022-02-05 LAB — FIBRINOGEN: FIBRINOGEN LEVEL: 137 mg/dL — ABNORMAL LOW (ref 175–500)

## 2022-02-05 LAB — AMYLASE: AMYLASE: 81 U/L (ref 30–118)

## 2022-02-05 LAB — URIC ACID: URIC ACID: 6.1 mg/dL

## 2022-02-05 LAB — GAMMA GT: GAMMA GLUTAMYL TRANSFERASE: 18 U/L

## 2022-02-05 LAB — MAGNESIUM: MAGNESIUM: 1.5 mg/dL — ABNORMAL LOW (ref 1.6–2.6)

## 2022-02-05 NOTE — Unmapped (Signed)
Surgery History and Physical Note      Attending Physician:  Phillips Grout Des*  Inpatient Service:  [facility])  Date: 02/05/2022      Assessment :  Matthew Key is a 61 y.o. 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 Celine Mans.       Plan:  - Admission to [facility]  - NPO, insert PIV,   - Pre-transplant laboratory workup is in progress  - Planned OR time: 6/28 at 0400  - Planned induction therapy: basilixumab and methylprednisolone  - Consent previously obtained on 12/11/21 (picture uploaded to media on 5/5). This procedure has been fully reviewed with the patient and patient is still agreeable.        History of Present Illness:   Chief Complaint: Liver Transplant    Matthew Key is a 61 y.o. 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 They were initially noted to have elevated LFTs in 11/2017 and were diagnosed with fatty liver in 04/2020, with decompensated hepatic cirrhosis diagnosed in 10/2021. They deny a history of bleeding varices. They do not have a TIPS in place. They have a history of non-bleeding varices and encephalopathy. They are not on therapeutic anticoagulation.     Today, they feel well. They deny any recent illnesses or sick contacts. They deny chest pain, shortness of breath, cough, or wheezing.    Allergies  Allergies   Allergen Reactions   ??? Venom-Honey Bee Swelling         Medications    No current facility-administered medications on file prior to encounter.     Current Outpatient Medications on File Prior to Encounter   Medication Sig Dispense Refill   ??? acetaminophen (TYLENOL) 325 MG tablet Take 2 tablets (650 mg total) by mouth every six (6) hours as needed.  0   ??? atorvastatin (LIPITOR) 10 MG tablet Take 1 tablet (10 mg total) by mouth nightly.     ??? ciprofloxacin HCl (CIPRO) 500 MG tablet TAKE 1 TABLET BY MOUTH IN THE MORNING 30 tablet 0   ??? furosemide (LASIX) 20 MG tablet Take 2 tablets (40 mg total) by mouth daily. Restart on 6/23 60 tablet 0   ??? gabapentin (NEURONTIN) 300 MG capsule Take 1 capsule (300 mg total) by mouth at bedtime. 30 capsule 5   ??? hydrOXYzine (VISTARIL) 50 MG capsule Take 1 capsule (50 mg total) by mouth nightly as needed.     ??? insulin syringe-needle U-100 1 mL 31 gauge x 5/16 (8 mm) Syrg Use once as needed for Hydrocortisone injection.. 10 each 0   ??? lactulose 10 gram/15 mL solution Take 30 mL (20 g total) by mouth Three (3) times a day. 2700 mL 10   ??? ondansetron (ZOFRAN-ODT) 4 MG disintegrating tablet TAKE 1 TABLET BY MOUTH EVERY 6 HOURS FOR 7 DAYS     ??? ondansetron (ZOFRAN-ODT) 4 MG disintegrating tablet Take 1 tablet (4 mg total) by mouth every eight (8) hours as needed for nausea. 72 tablet 11   ??? pantoprazole (PROTONIX) 40 MG tablet Take 1 tablet (40 mg total) by mouth daily. 30 tablet 2   ??? pen needle, diabetic (BD ULTRA-FINE NANO PEN NEEDLE) 32 gauge x 5/32 (4  mm) Ndle ok to sub any brand or size needle preferred by insurance/patient, use 1-2x/day, dx E11.65 100 each 12   ??? rifAXIMin (XIFAXAN) 550 mg Tab Take 1 tablet (550 mg total) by mouth Two (2) times a day. 60 tablet 5   ??? sodium chloride (AYR) 0.65 % Drop 1 spray.     ??? spironolactone (ALDACTONE) 50 MG tablet Take 1 tablet (50 mg total) by mouth daily. Restart 6/23 30 tablet 0   ??? syringe with needle 3 mL 23 x 1 Syrg Use as needed for Hydrocortisone injection 1 each 0   ??? [EXPIRED] TRESIBA FLEXTOUCH U-100 100 unit/mL (3 mL) InPn Inject 0.3 mL (30 Units total) under the skin at bedtime. Adjust as instructed. 15 mL 0   ??? zinc sulfate 110 mg (25 mg elemental zinc) Tab Take 1 tablet (110 mg total) by mouth in the morning. 30 tablet 11         Past Medical History  Past Medical History: Diagnosis Date   ??? AKI (acute kidney injury) (CMS-HCC) 12/14/2020   ??? Anxiety 10/22/2013   ??? Arthritis    ??? Cervical radiculopathy 12/03/2016   ??? Chronic pain disorder     Lower back   ??? Cirrhosis (CMS-HCC)    ??? Dental abscess 10/2020   ??? Duodenal ulcer 12/01/2017   ??? GERD (gastroesophageal reflux disease)    ??? History of transfusion    ??? Hyperlipidemia 10/22/2013   ??? Hypertension     under control with meds and weight loss   ??? Hypotension 01/29/2022   ??? Liver disease    ??? Sleep apnea, obstructive     Have machine   ??? Stroke (CMS-HCC)     mild stroke   ??? Type 2 diabetes mellitus with diabetic neuropathy, with long-term current use of insulin (CMS-HCC) 06/09/2014         Past Surgical History  Past Surgical History:   Procedure Laterality Date   ??? KNEE SURGERY     ??? PR CATH PLACE/CORON ANGIO, IMG SUPER/INTERP,R&L HRT CATH, L HRT VENTRIC N/A 12/19/2020    Procedure: CATH LEFT/RIGHT HEART CATHETERIZATION;  Surgeon: Rosana Hoes, MD;  Location: [facility] CATH;  Service: Cardiology   ??? PR COLONOSCOPY FLX DX W/COLLJ SPEC WHEN PFRMD N/A 12/07/2021    Procedure: COLONOSCOPY, FLEXIBLE, PROXIMAL TO SPLENIC FLEXURE; DIAGNOSTIC, W/WO COLLECTION SPECIMEN BY BRUSH OR WASH;  Surgeon: Annie Paras, MD;  [facility];  Service: Gastroenterology   ??? PR UPPER GI ENDOSCOPY,BIOPSY N/A 10/12/2020    Procedure: UGI ENDOSCOPY; WITH BIOPSY, SINGLE OR MULTIPLE;  Surgeon: Marene Lenz, MD;  [facility]: [facility];  Service: Gastroenterology   ??? PR UPPER GI ENDOSCOPY,DIAGNOSIS N/A 01/03/2022    Procedure: UGI ENDO, INCLUDE ESOPHAGUS, STOMACH, & DUODENUM &/OR JEJUNUM; DX W/WO COLLECTION SPECIMN, BY BRUSH OR WASH;  Surgeon: Marene Lenz, MD;  [facility];  Service: Gastroenterology   ??? PR UPPER GI ENDOSCOPY,LIGAT VARIX N/A 12/07/2021    Procedure: UGI ENDO; W/BAND LIG ESOPH &/OR GASTRIC VARICES;  Surgeon: Annie Paras, MD;  [facility]: [facility]; Service: Gastroenterology   ??? ROOT CANAL      Front teeth         Family History  Family History   Problem Relation Age of Onset   ??? Edema Mother    ??? Alzheimer's disease Father    ??? Aortic dissection Brother    ??? Early death Brother    ???  Aneurysm Brother          Social History:  Social History     Tobacco Use   ??? Smoking status: Never   ??? Smokeless tobacco: Never   Vaping Use   ??? Vaping Use: Never used   Substance Use Topics   ??? Alcohol use: Not Currently   ??? Drug use: Not Currently         Review of Systems  A 12 system review of systems was negative except as noted in HPI      Vital Signs    No data found.    Physical Exam  General Appearance: male in no acute distress. Alert and oriented x 3.   Head:  Normocephalic, atraumatic.  Eyes: Conjunctiva and lids appear normal. Pupils equal, round, and reactive to light. Sclera anicteric.  Nose: Nares grossly normal, no drainage.  Neck: Supple, symmetrical. No appreciable thyromegaly or nodules.   Pulmonary: Normal respiratory effort. Lungs clear to ausculation bilaterally. No rhonchi. No wheezes.  Cardiovascular: Regular rate and rhythm. No murmurs, rubs or gallops appreciated. Palpable radial, femoral, and DP pulses  Abdomen: Soft, non-tender, without masses. No hepatosplenomegaly. No hernias. No signs of prior incisional scars  Musculoskeletal: Extremities without clubbing or cyanosis. Trace edema.  Neurologic:  No motor abnormalities noted. Sensation grossly intact.  Lymphatic: No cervical or supraclavicular lymphadenopathy.   Skin:  Skin color normal. No rashes or lesions. No Jaundice  Psychiatric: Judgement and insight seem appropriate. Oriented to person, place and time.    Labs and Studies  Labs:  Recent Results (from the past 24 hour(s))   Comprehensive Metabolic Panel    Collection Time: 02/04/22  3:12 PM   Result Value Ref Range    Glucose 246 (H) 70 - 99 mg/dL    BUN 21 8 - 27 mg/dL    Creatinine 1.61 (H) 0.76 - 1.27 mg/dL    BUN/Creatinine Ratio 14 10 - 24 Sodium 136 134 - 144 mmol/L    Potassium 4.7 3.5 - 5.2 mmol/L    Chloride 102 96 - 106 mmol/L    CO2 24 20 - 29 mmol/L    Calcium 8.6 8.6 - 10.2 mg/dL    Total Protein 6.0 6.0 - 8.5 g/dL    Albumin 3.5 (L) 3.8 - 4.8 g/dL    Globulin, Total 2.5 1.5 - 4.5 g/dL    A/G Ratio 1.4 1.2 - 2.2    Total Bilirubin 2.6 (H) 0.0 - 1.2 mg/dL    Alkaline Phosphatase 208 (H) 44 - 121 IU/L    AST 34 0 - 40 IU/L    ALT 24 0 - 44 IU/L   PT-INR    Collection Time: 02/04/22  3:12 PM   Result Value Ref Range    INR 1.6 (H) 0.9 - 1.2    Prothrombin Time 16.6 (H) 9.1 - 12.0 sec       Imaging:   ECHO 03/07/2022  Summary  ????1. The left ventricle is upper normal in size with normal wall thickness.  ????2. The left ventricular systolic function is normal, LVEF is visually  estimated at 55-60%.  ????3. There is mild aortic regurgitation.  ????4. The left atrium is mildly to moderately dilated in size.  ????5. The right ventricle is normal in size, with normal systolic function.  ????6. The aorta at the sinuses of Valsalva and ascending aorta is mildly  dilated.  ??  Stress Test: 01/18/2020  Summary  ????1. Stress echocardiogram is normal  CT A/P: 08/25/21  Mural thickening noted in the sigmoid colon, possibly related to underdistention versus mild colitis, appears new compared with 06/06/2021   ??   Cirrhosis with evidence portal hypertension, as above, including moderate ascites and moderate to large right pleural effusion.   ??   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 Largest appears moderately increased in size since 05/19/2018. Follow-up recommended as below   ??   1.5 - 2.5 cm cyst, no MPD communication, OR cannot be determined   ???? ?? --EUS/FNA OR   ???? ?? --Reimage q33mo x 4, then q1y x 2, then q2y x 3   ???? ?? ?? ?? ??--STOP if stable over 10 years   ???? ?? ?? ?? ??--If interval Growth   ???? ?? ?? ?? ?? ?? ?? -- > 2.5 cm: EUS/FNA   ???? ?? ?? ?? ?? ?? ?? -- < 2.6 cm: EUS/FNA OR Reimage q3mo x 2, then q1y x 5 then q2y        MR Liver: MR Liver: 10/05/2021  1. Cirrhotic liver with heterogeneous enhancement and possible fibrosis, but no new enhancing masses with washout to suggest interval development of hepatocellular carcinoma.   ??   2. Sequela of portal hypertension including ascites, splenomegaly and varices. Moderate ascites has increased when compared to the previous examination.   ??   3. 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 Continued attention on follow-up to ensure stability.   ??   4. New large right pleural effusion.   ??          Warrick Parisian, MD  PGY-1 General Surgery

## 2022-02-06 DIAGNOSIS — Z944 Liver transplant status: Principal | ICD-10-CM

## 2022-02-06 LAB — HEPTEM
HEPTEM ALPHA ANGLE: 59 degrees — ABNORMAL LOW (ref 70–81)
HEPTEM AMPLITUDE AT 10 MINUTES: 35 mm — ABNORMAL LOW (ref 44–64)
HEPTEM AMPLITUDE AT 20 MINUTES: 42 mm — ABNORMAL LOW (ref 51–72)
HEPTEM CLOT FORMATION TIME: 186 s — ABNORMAL HIGH (ref 45–110)
HEPTEM CLOTTING TIME: 225 s — ABNORMAL HIGH (ref 122–208)
HEPTEM LYSIS INDEX AT 30 MINUTES: 100 %
HEPTEM MAXIMUM CLOT FIRMNESS: 43 mm — ABNORMAL LOW (ref 51–72)
HEPTEM MAXIMUM LYSIS: 0 %

## 2022-02-06 LAB — BLOOD GAS CRITICAL CARE PANEL, ARTERIAL
BASE EXCESS ARTERIAL: -0.4 (ref -2.0–2.0)
BASE EXCESS ARTERIAL: -3 — ABNORMAL LOW (ref -2.0–2.0)
BASE EXCESS ARTERIAL: -3.8 — ABNORMAL LOW (ref -2.0–2.0)
BASE EXCESS ARTERIAL: -6.6 — ABNORMAL LOW (ref -2.0–2.0)
BASE EXCESS ARTERIAL: -7.5 — ABNORMAL LOW (ref -2.0–2.0)
BASE EXCESS ARTERIAL: -8.6 — ABNORMAL LOW (ref -2.0–2.0)
BASE EXCESS ARTERIAL: -3.1 — ABNORMAL LOW (ref -2.0–2.0)
CALCIUM IONIZED ARTERIAL (MG/DL): 5.02 mg/dL (ref 4.40–5.40)
CALCIUM IONIZED ARTERIAL (MG/DL): 4.74 mg/dL (ref 4.40–5.40)
CALCIUM IONIZED ARTERIAL (MG/DL): 4.32 mg/dL — ABNORMAL LOW (ref 4.40–5.40)
CALCIUM IONIZED ARTERIAL (MG/DL): 4.1 mg/dL — ABNORMAL LOW (ref 4.40–5.40)
CALCIUM IONIZED ARTERIAL (MG/DL): 4.29 mg/dL — ABNORMAL LOW (ref 4.40–5.40)
CALCIUM IONIZED ARTERIAL (MG/DL): 4.67 mg/dL (ref 4.40–5.40)
CALCIUM IONIZED ARTERIAL (MG/DL): 4.76 mg/dL (ref 4.40–5.40)
FIO2 ARTERIAL: 55
FIO2 ARTERIAL: 30
FIO2 ARTERIAL: 30
FIO2 ARTERIAL: 40
FIO2 ARTERIAL: 30
FIO2 ARTERIAL: 45
GLUCOSE WHOLE BLOOD: 125 mg/dL (ref 70–179)
GLUCOSE WHOLE BLOOD: 140 mg/dL (ref 70–179)
GLUCOSE WHOLE BLOOD: 167 mg/dL (ref 70–179)
GLUCOSE WHOLE BLOOD: 165 mg/dL (ref 70–179)
GLUCOSE WHOLE BLOOD: 158 mg/dL (ref 70–179)
GLUCOSE WHOLE BLOOD: 258 mg/dL — ABNORMAL HIGH (ref 70–179)
GLUCOSE WHOLE BLOOD: 218 mg/dL — ABNORMAL HIGH (ref 70–179)
HCO3 ARTERIAL: 24 mmol/L (ref 22–27)
HCO3 ARTERIAL: 21 mmol/L — ABNORMAL LOW (ref 22–27)
HCO3 ARTERIAL: 21 mmol/L — ABNORMAL LOW (ref 22–27)
HCO3 ARTERIAL: 18 mmol/L — ABNORMAL LOW (ref 22–27)
HCO3 ARTERIAL: 17 mmol/L — ABNORMAL LOW (ref 22–27)
HCO3 ARTERIAL: 19 mmol/L — ABNORMAL LOW (ref 22–27)
HCO3 ARTERIAL: 21 mmol/L — ABNORMAL LOW (ref 22–27)
HEMOGLOBIN BLOOD GAS: 7.8 g/dL — ABNORMAL LOW
HEMOGLOBIN BLOOD GAS: 8.6 g/dL — ABNORMAL LOW
HEMOGLOBIN BLOOD GAS: 9.1 g/dL — ABNORMAL LOW
HEMOGLOBIN BLOOD GAS: 9.3 g/dL — ABNORMAL LOW
HEMOGLOBIN BLOOD GAS: 9.1 g/dL — ABNORMAL LOW (ref 13.50–17.50)
HEMOGLOBIN BLOOD GAS: 9.6 g/dL — ABNORMAL LOW
HEMOGLOBIN BLOOD GAS: 10.3 g/dL — ABNORMAL LOW (ref 13.50–17.50)
LACTATE BLOOD ARTERIAL: 1.7 mmol/L — ABNORMAL HIGH (ref <1.3)
LACTATE BLOOD ARTERIAL: 2.9 mmol/L — ABNORMAL HIGH (ref <1.3)
LACTATE BLOOD ARTERIAL: 4.1 mmol/L (ref <1.3)
LACTATE BLOOD ARTERIAL: 4.6 mmol/L (ref <1.3)
LACTATE BLOOD ARTERIAL: 5.4 mmol/L (ref <1.3)
LACTATE BLOOD ARTERIAL: 5.9 mmol/L (ref <1.3)
LACTATE BLOOD ARTERIAL: 3.2 mmol/L — ABNORMAL HIGH (ref <1.3)
O2 SATURATION ARTERIAL: >100 % — ABNORMAL HIGH (ref 94.0–100.0)
O2 SATURATION ARTERIAL: >100 % — ABNORMAL HIGH (ref 94.0–100.0)
O2 SATURATION ARTERIAL: 98.5 % (ref 94.0–100.0)
O2 SATURATION ARTERIAL: 97.3 % (ref 94.0–100.0)
O2 SATURATION ARTERIAL: 98.3 % (ref 94.0–100.0)
O2 SATURATION ARTERIAL: 99.8 % (ref 94.0–100.0)
O2 SATURATION ARTERIAL: >100 % — ABNORMAL HIGH (ref 94.0–100.0)
PCO2 ARTERIAL: 37.7 mmHg (ref 35.0–45.0)
PCO2 ARTERIAL: 35.1 mmHg (ref 35.0–45.0)
PCO2 ARTERIAL: 38.9 mmHg (ref 35.0–45.0)
PCO2 ARTERIAL: 34.8 mmHg — ABNORMAL LOW (ref 35.0–45.0)
PCO2 ARTERIAL: 36.8 mmHg (ref 35.0–45.0)
PCO2 ARTERIAL: 40.2 mmHg (ref 35.0–45.0)
PCO2 ARTERIAL: 33.5 mmHg — ABNORMAL LOW (ref 35.0–45.0)
PH ARTERIAL: 7.41 (ref 7.35–7.45)
PH ARTERIAL: 7.4 (ref 7.35–7.45)
PH ARTERIAL: 7.35 (ref 7.35–7.45)
PH ARTERIAL: 7.34 — ABNORMAL LOW (ref 7.35–7.45)
PH ARTERIAL: 7.28 — ABNORMAL LOW (ref 7.35–7.45)
PH ARTERIAL: 7.28 — ABNORMAL LOW (ref 7.35–7.45)
PH ARTERIAL: 7.41 (ref 7.35–7.45)
PO2 ARTERIAL: 181 mmHg — ABNORMAL HIGH (ref 80.0–110.0)
PO2 ARTERIAL: 142 mmHg — ABNORMAL HIGH (ref 80.0–110.0)
PO2 ARTERIAL: 117 mmHg — ABNORMAL HIGH (ref 80.0–110.0)
PO2 ARTERIAL: 93.4 mmHg (ref 80.0–110.0)
PO2 ARTERIAL: 115 mmHg — ABNORMAL HIGH (ref 80.0–110.0)
PO2 ARTERIAL: 139 mmHg — ABNORMAL HIGH (ref 80.0–110.0)
PO2 ARTERIAL: 161 mmHg — ABNORMAL HIGH (ref 80.0–110.0)
POTASSIUM WHOLE BLOOD: 3.9 mmol/L (ref 3.4–4.6)
POTASSIUM WHOLE BLOOD: 3.8 mmol/L (ref 3.4–4.6)
POTASSIUM WHOLE BLOOD: 4.1 mmol/L (ref 3.4–4.6)
POTASSIUM WHOLE BLOOD: 4.3 mmol/L (ref 3.4–4.6)
POTASSIUM WHOLE BLOOD: 4.5 mmol/L (ref 3.4–4.6)
POTASSIUM WHOLE BLOOD: 4.7 mmol/L — ABNORMAL HIGH (ref 3.4–4.6)
POTASSIUM WHOLE BLOOD: 3.9 mmol/L (ref 3.4–4.6)
SODIUM WHOLE BLOOD: 141 mmol/L (ref 135–145)
SODIUM WHOLE BLOOD: 140 mmol/L (ref 135–145)
SODIUM WHOLE BLOOD: 142 mmol/L (ref 135–145)
SODIUM WHOLE BLOOD: 140 mmol/L (ref 135–145)
SODIUM WHOLE BLOOD: 142 mmol/L (ref 135–145)
SODIUM WHOLE BLOOD: 143 mmol/L (ref 135–145)
SODIUM WHOLE BLOOD: 142 mmol/L (ref 135–145)

## 2022-02-06 LAB — PHOSPHORUS
PHOSPHORUS: 2.6 mg/dL (ref 2.4–5.1)
PHOSPHORUS: 2.6 mg/dL (ref 2.4–5.1)
PHOSPHORUS: 3.6 mg/dL (ref 2.4–5.1)

## 2022-02-06 LAB — CBC
HEMATOCRIT: 28.1 % — ABNORMAL LOW (ref 39.0–48.0)
HEMATOCRIT: 27.4 % — ABNORMAL LOW (ref 39.0–48.0)
HEMATOCRIT: 27.7 % — ABNORMAL LOW (ref 39.0–48.0)
HEMOGLOBIN: 9.6 g/dL — ABNORMAL LOW (ref 12.9–16.5)
HEMOGLOBIN: 9.8 g/dL — ABNORMAL LOW (ref 12.9–16.5)
HEMOGLOBIN: 9.9 g/dL — ABNORMAL LOW (ref 12.9–16.5)
MEAN CORPUSCULAR HEMOGLOBIN CONC: 34.1 g/dL (ref 32.0–36.0)
MEAN CORPUSCULAR HEMOGLOBIN CONC: 35.9 g/dL (ref 32.0–36.0)
MEAN CORPUSCULAR HEMOGLOBIN CONC: 35.7 g/dL (ref 32.0–36.0)
MEAN CORPUSCULAR HEMOGLOBIN: 32.9 pg — ABNORMAL HIGH (ref 25.9–32.4)
MEAN CORPUSCULAR HEMOGLOBIN: 33.8 pg — ABNORMAL HIGH (ref 25.9–32.4)
MEAN CORPUSCULAR HEMOGLOBIN: 33.6 pg — ABNORMAL HIGH (ref 25.9–32.4)
MEAN CORPUSCULAR VOLUME: 96.4 fL — ABNORMAL HIGH (ref 77.6–95.7)
MEAN CORPUSCULAR VOLUME: 94.2 fL (ref 77.6–95.7)
MEAN CORPUSCULAR VOLUME: 94.1 fL (ref 77.6–95.7)
MEAN PLATELET VOLUME: 10.3 fL (ref 6.8–10.7)
MEAN PLATELET VOLUME: 8.8 fL (ref 6.8–10.7)
MEAN PLATELET VOLUME: 10.8 fL — ABNORMAL HIGH (ref 6.8–10.7)
PLATELET COUNT: 60 10*9/L — ABNORMAL LOW (ref 150–450)
PLATELET COUNT: 37 10*9/L — ABNORMAL LOW (ref 150–450)
PLATELET COUNT: 35 10*9/L — ABNORMAL LOW (ref 150–450)
RED BLOOD CELL COUNT: 2.91 10*12/L — ABNORMAL LOW (ref 4.26–5.60)
RED BLOOD CELL COUNT: 2.9 10*12/L — ABNORMAL LOW (ref 4.26–5.60)
RED BLOOD CELL COUNT: 2.95 10*12/L — ABNORMAL LOW (ref 4.26–5.60)
RED CELL DISTRIBUTION WIDTH: 19 % — ABNORMAL HIGH (ref 12.2–15.2)
RED CELL DISTRIBUTION WIDTH: 18.7 % — ABNORMAL HIGH (ref 12.2–15.2)
RED CELL DISTRIBUTION WIDTH: 18.7 % — ABNORMAL HIGH (ref 12.2–15.2)
WBC ADJUSTED: 16.2 10*9/L — ABNORMAL HIGH (ref 3.6–11.2)
WBC ADJUSTED: 7.9 10*9/L (ref 3.6–11.2)
WBC ADJUSTED: 6.7 10*9/L (ref 3.6–11.2)

## 2022-02-06 LAB — EXTEM
EXTEM ALPHA ANGLE: 52 degrees — ABNORMAL LOW (ref 65–80)
EXTEM ALPHA ANGLE: 51 degrees — ABNORMAL LOW (ref 65–80)
EXTEM ALPHA ANGLE: 54 degrees — ABNORMAL LOW (ref 65–80)
EXTEM ALPHA ANGLE: 54 degrees — ABNORMAL LOW (ref 65–80)
EXTEM AMPLITUDE AT 10 MINUTES: 31 mm — ABNORMAL LOW (ref 44–66)
EXTEM AMPLITUDE AT 10 MINUTES: 31 mm — ABNORMAL LOW (ref 44–66)
EXTEM AMPLITUDE AT 10 MINUTES: 34 mm — ABNORMAL LOW (ref 44–66)
EXTEM AMPLITUDE AT 10 MINUTES: 34 mm — ABNORMAL LOW (ref 44–66)
EXTEM AMPLITUDE AT 20 MINUTES: 38 mm — ABNORMAL LOW (ref 50–70)
EXTEM AMPLITUDE AT 20 MINUTES: 39 mm — ABNORMAL LOW (ref 50–70)
EXTEM AMPLITUDE AT 20 MINUTES: 42 mm — ABNORMAL LOW (ref 50–70)
EXTEM AMPLITUDE AT 20 MINUTES: 42 mm — ABNORMAL LOW (ref 50–70)
EXTEM CLOT FORMATION TIME: 229 s — ABNORMAL HIGH (ref 48–127)
EXTEM CLOT FORMATION TIME: 208 s — ABNORMAL HIGH (ref 48–127)
EXTEM CLOT FORMATION TIME: 245 s — ABNORMAL HIGH (ref 48–127)
EXTEM CLOT FORMATION TIME: 218 s — ABNORMAL HIGH (ref 48–127)
EXTEM CLOTTING TIME: 64 s (ref 43–82)
EXTEM CLOTTING TIME: 62 s (ref 43–82)
EXTEM CLOTTING TIME: 72 s (ref 43–82)
EXTEM CLOTTING TIME: 67 s (ref 43–82)
EXTEM LYSIS INDEX AT 30 MINUTES: 100 %
EXTEM LYSIS INDEX AT 30 MINUTES: 100 %
EXTEM LYSIS INDEX AT 30 MINUTES: 100 %
EXTEM LYSIS INDEX AT 30 MINUTES: 100 %
EXTEM MAXIMUM CLOT FIRMNESS: 42 mm — ABNORMAL LOW (ref 52–70)
EXTEM MAXIMUM CLOT FIRMNESS: 44 mm — ABNORMAL LOW (ref 52–70)
EXTEM MAXIMUM CLOT FIRMNESS: 46 mm — ABNORMAL LOW (ref 52–70)
EXTEM MAXIMUM CLOT FIRMNESS: 47 mm — ABNORMAL LOW (ref 52–70)
EXTEM MAXIMUM LYSIS: 0 %
EXTEM MAXIMUM LYSIS: 1 %
EXTEM MAXIMUM LYSIS: 1 %
EXTEM MAXIMUM LYSIS: 0 %

## 2022-02-06 LAB — FIBTEM
FIBTEM AMPLITUDE AT 10 MINUTES: 7 mm (ref 7–24)
FIBTEM AMPLITUDE AT 10 MINUTES: 8 mm (ref 7–24)
FIBTEM AMPLITUDE AT 10 MINUTES: 8 mm (ref 7–24)
FIBTEM AMPLITUDE AT 10 MINUTES: 8 mm (ref 7–24)
FIBTEM AMPLITUDE AT 20 MINUTES: 7 mm (ref 7–24)
FIBTEM AMPLITUDE AT 20 MINUTES: 8 mm (ref 7–24)
FIBTEM AMPLITUDE AT 20 MINUTES: 8 mm (ref 7–24)
FIBTEM AMPLITUDE AT 20 MINUTES: 9 mm (ref 7–24)
FIBTEM MAXIMUM CLOT FIRMNESS: 7 mm (ref 7–24)
FIBTEM MAXIMUM CLOT FIRMNESS: 8 mm (ref 7–24)
FIBTEM MAXIMUM CLOT FIRMNESS: 8 mm (ref 7–24)
FIBTEM MAXIMUM CLOT FIRMNESS: 9 mm (ref 7–24)

## 2022-02-06 LAB — HEMOGLOBIN, BLOOD GASES
HEMOGLOBIN BLOOD GAS: 9.2 g/dL — ABNORMAL LOW (ref 13.50–17.50)
HEMOGLOBIN BLOOD GAS: 9.3 g/dL — ABNORMAL LOW (ref 13.50–17.50)
HEMOGLOBIN BLOOD GAS: 10 g/dL — ABNORMAL LOW
HEMOGLOBIN BLOOD GAS: 9.9 g/dL — ABNORMAL LOW (ref 13.50–17.50)

## 2022-02-06 LAB — BLOOD GAS, ARTERIAL
BASE EXCESS ARTERIAL: -3.4 — ABNORMAL LOW (ref -2.0–2.0)
BASE EXCESS ARTERIAL: -3.1 — ABNORMAL LOW (ref -2.0–2.0)
BASE EXCESS ARTERIAL: -2.1 — ABNORMAL LOW (ref -2.0–2.0)
BASE EXCESS ARTERIAL: -1.4 (ref -2.0–2.0)
FIO2 ARTERIAL: 40
FIO2 ARTERIAL: 40
FIO2 ARTERIAL: 40
FIO2 ARTERIAL: 40
HCO3 ARTERIAL: 21 mmol/L — ABNORMAL LOW (ref 22–27)
HCO3 ARTERIAL: 21 mmol/L — ABNORMAL LOW (ref 22–27)
HCO3 ARTERIAL: 22 mmol/L (ref 22–27)
HCO3 ARTERIAL: 23 mmol/L (ref 22–27)
O2 SATURATION ARTERIAL: >100 % — ABNORMAL HIGH (ref 94.0–100.0)
O2 SATURATION ARTERIAL: 99.9 % (ref 94.0–100.0)
O2 SATURATION ARTERIAL: 99.4 % (ref 94.0–100.0)
O2 SATURATION ARTERIAL: 99.7 % (ref 94.0–100.0)
PCO2 ARTERIAL: 34.2 mmHg — ABNORMAL LOW (ref 35.0–45.0)
PCO2 ARTERIAL: 33.3 mmHg — ABNORMAL LOW (ref 35.0–45.0)
PCO2 ARTERIAL: 36.5 mmHg (ref 35.0–45.0)
PCO2 ARTERIAL: 37 mmHg (ref 35.0–45.0)
PH ARTERIAL: 7.4 (ref 7.35–7.45)
PH ARTERIAL: 7.41 (ref 7.35–7.45)
PH ARTERIAL: 7.4 (ref 7.35–7.45)
PH ARTERIAL: 7.4 (ref 7.35–7.45)
PO2 ARTERIAL: 176 mmHg — ABNORMAL HIGH (ref 80.0–110.0)
PO2 ARTERIAL: 138 mmHg — ABNORMAL HIGH (ref 80.0–110.0)
PO2 ARTERIAL: 146 mmHg — ABNORMAL HIGH (ref 80.0–110.0)
PO2 ARTERIAL: 129 mmHg — ABNORMAL HIGH (ref 80.0–110.0)

## 2022-02-06 LAB — BASIC METABOLIC PANEL
ANION GAP: 7 mmol/L (ref 5–14)
ANION GAP: 9 mmol/L (ref 5–14)
BLOOD UREA NITROGEN: 21 mg/dL (ref 9–23)
BLOOD UREA NITROGEN: 24 mg/dL — ABNORMAL HIGH (ref 9–23)
BUN / CREAT RATIO: 15
BUN / CREAT RATIO: 16
CALCIUM: 8.7 mg/dL (ref 8.7–10.4)
CALCIUM: 8.7 mg/dL (ref 8.7–10.4)
CHLORIDE: 111 mmol/L — ABNORMAL HIGH (ref 98–107)
CHLORIDE: 110 mmol/L — ABNORMAL HIGH (ref 98–107)
CO2: 23 mmol/L (ref 20.0–31.0)
CO2: 24 mmol/L (ref 20.0–31.0)
CREATININE: 1.42 mg/dL — ABNORMAL HIGH
CREATININE: 1.53 mg/dL — ABNORMAL HIGH
EGFR CKD-EPI (2021) MALE: 56 mL/min/{1.73_m2} — ABNORMAL LOW (ref >=60)
EGFR CKD-EPI (2021) MALE: 51 mL/min/{1.73_m2} — ABNORMAL LOW (ref >=60)
GLUCOSE RANDOM: 202 mg/dL — ABNORMAL HIGH (ref 70–99)
GLUCOSE RANDOM: 165 mg/dL — ABNORMAL HIGH (ref 70–99)
POTASSIUM: 4.1 mmol/L (ref 3.4–4.8)
POTASSIUM: 3.9 mmol/L (ref 3.4–4.8)
SODIUM: 141 mmol/L (ref 135–145)
SODIUM: 143 mmol/L (ref 135–145)

## 2022-02-06 LAB — MAGNESIUM
MAGNESIUM: 1.4 mg/dL — ABNORMAL LOW (ref 1.6–2.6)
MAGNESIUM: 1.5 mg/dL — ABNORMAL LOW (ref 1.6–2.6)
MAGNESIUM: 1.9 mg/dL (ref 1.6–2.6)

## 2022-02-06 LAB — ENDOTOOL
ENDOTOOL GLUCOSE: 201 mg/dL — ABNORMAL HIGH (ref 140–180)
ENDOTOOL GLUCOSE: 135 mg/dL — ABNORMAL LOW (ref 140–180)
ENDOTOOL GLUCOSE: 159 mg/dL (ref 140–180)
ENDOTOOL GLUCOSE: 158 mg/dL (ref 140–180)
ENDOTOOL GLUCOSE: 131 mg/dL — ABNORMAL LOW (ref 140–180)
ENDOTOOL GLUCOSE: 135 mg/dL — ABNORMAL LOW (ref 140–180)
ENDOTOOL GLUCOSE: 145 mg/dL (ref 140–180)
ENDOTOOL GLUCOSE: 151 mg/dL (ref 140–180)
ENDOTOOL GLUCOSE: 125 mg/dL — ABNORMAL LOW (ref 140–180)
ENDOTOOL GLUCOSE: 150 mg/dL (ref 140–180)
ENDOTOOL INSULIN RATE: 8.5 U/h
ENDOTOOL INSULIN RATE: 1.5 U/h
ENDOTOOL INSULIN RATE: 4 U/h
ENDOTOOL INSULIN RATE: 4.4 U/h
ENDOTOOL INSULIN RATE: 1.9 U/h
ENDOTOOL INSULIN RATE: 2.4 U/h
ENDOTOOL INSULIN RATE: 3 U/h
ENDOTOOL INSULIN RATE: 3.4 U/h
ENDOTOOL INSULIN RATE: 1.3 U/h
ENDOTOOL INSULIN RATE: 3 U/h

## 2022-02-06 LAB — APTT
APTT: 39.6 s — ABNORMAL HIGH (ref 25.1–36.5)
APTT: 35.8 s (ref 25.1–36.5)
HEPARIN CORRELATION: 0.2
HEPARIN CORRELATION: 0.2

## 2022-02-06 LAB — HEPATIC FUNCTION PANEL
ALBUMIN: 3.1 g/dL — ABNORMAL LOW (ref 3.4–5.0)
ALBUMIN: 2.9 g/dL — ABNORMAL LOW (ref 3.4–5.0)
ALKALINE PHOSPHATASE: 68 U/L (ref 46–116)
ALKALINE PHOSPHATASE: 59 U/L (ref 46–116)
ALT (SGPT): 385 U/L — ABNORMAL HIGH (ref 10–49)
ALT (SGPT): 364 U/L — ABNORMAL HIGH (ref 10–49)
AST (SGOT): 553 U/L — ABNORMAL HIGH (ref <=34)
AST (SGOT): 475 U/L — ABNORMAL HIGH (ref <=34)
BILIRUBIN DIRECT: 1.8 mg/dL — ABNORMAL HIGH (ref 0.00–0.30)
BILIRUBIN DIRECT: 1.3 mg/dL — ABNORMAL HIGH (ref 0.00–0.30)
BILIRUBIN TOTAL: 2.9 mg/dL — ABNORMAL HIGH (ref 0.3–1.2)
BILIRUBIN TOTAL: 2.1 mg/dL — ABNORMAL HIGH (ref 0.3–1.2)
PROTEIN TOTAL: 5.1 g/dL — ABNORMAL LOW (ref 5.7–8.2)
PROTEIN TOTAL: 5 g/dL — ABNORMAL LOW (ref 5.7–8.2)

## 2022-02-06 LAB — CALCIUM, IONIZED, ARTERIAL
CALCIUM IONIZED ARTERIAL (MG/DL): 4.7 mg/dL (ref 4.40–5.40)
CALCIUM IONIZED ARTERIAL (MG/DL): 4.77 mg/dL (ref 4.40–5.40)
CALCIUM IONIZED ARTERIAL (MG/DL): 5.07 mg/dL (ref 4.40–5.40)
CALCIUM IONIZED ARTERIAL (MG/DL): 5.07 mg/dL (ref 4.40–5.40)

## 2022-02-06 LAB — AMYLASE
AMYLASE: 53 U/L (ref 30–118)
AMYLASE: 47 U/L (ref 30–118)

## 2022-02-06 LAB — HLA ANTIBODY SCREEN

## 2022-02-06 LAB — PLATELET COUNT: PLATELET COUNT: 61 10*9/L — ABNORMAL LOW (ref 150–450)

## 2022-02-06 LAB — FIBRINOGEN: FIBRINOGEN LEVEL: 158 mg/dL — ABNORMAL LOW (ref 175–500)

## 2022-02-06 LAB — GAMMA GT
GAMMA GLUTAMYL TRANSFERASE: 71 U/L
GAMMA GLUTAMYL TRANSFERASE: 72 U/L

## 2022-02-06 LAB — PROTIME-INR
INR: 1.67
INR: 1.4
PROTIME: 19.3 s — ABNORMAL HIGH (ref 9.8–12.8)
PROTIME: 16 s — ABNORMAL HIGH (ref 9.8–12.8)

## 2022-02-06 LAB — SYPHILIS SCREEN: SYPHILIS RPR SCREEN: NONREACTIVE

## 2022-02-06 LAB — LACTATE, ARTERIAL, WHOLE BLOOD
LACTATE BLOOD ARTERIAL: 1.9 mmol/L — ABNORMAL HIGH (ref <1.3)
LACTATE BLOOD ARTERIAL: 1.4 mmol/L — ABNORMAL HIGH (ref <1.3)
LACTATE BLOOD ARTERIAL: 1.4 mmol/L — ABNORMAL HIGH (ref <1.3)
LACTATE BLOOD ARTERIAL: 1.4 mmol/L — ABNORMAL HIGH (ref <1.3)

## 2022-02-06 LAB — TRANSPLANT HEPATITIS C RNA, QUANTITATIVE, PCR: HCV RNA: NOT DETECTED

## 2022-02-06 MED ADMIN — insulin regular (HumuLIN,NovoLIN) 100 Units in sodium chloride (NS) 0.9 % 100 mL infusion: INTRAVENOUS | @ 14:00:00 | Stop: 2022-02-06

## 2022-02-06 MED ADMIN — basiliximab (SIMULECT) injection: INTRAVENOUS | @ 12:00:00 | Stop: 2022-02-06

## 2022-02-06 MED ADMIN — phenylephrine 0.8 mg/10 mL (80 mcg/mL) injection: INTRAVENOUS | @ 09:00:00 | Stop: 2022-02-06

## 2022-02-06 MED ADMIN — sodium chloride (NS) 0.9 % infusion: INTRAVENOUS | @ 09:00:00 | Stop: 2022-02-06

## 2022-02-06 MED ADMIN — magnesium sulfate 2gm/50mL IVPB: 2 g | INTRAVENOUS | @ 22:00:00

## 2022-02-06 MED ADMIN — vasopressin (VASOSTRICT) injection: INTRAVENOUS | @ 10:00:00 | Stop: 2022-02-06

## 2022-02-06 MED ADMIN — matrix hemostatic sealant (FLOSEAL) topical: TOPICAL | @ 10:00:00 | Stop: 2022-02-06

## 2022-02-06 MED ADMIN — ROCuronium (ZEMURON) injection: INTRAVENOUS | @ 10:00:00 | Stop: 2022-02-06

## 2022-02-06 MED ADMIN — phenylephrine 0.8 mg/10 mL (80 mcg/mL) injection: INTRAVENOUS | @ 16:00:00 | Stop: 2022-02-06

## 2022-02-06 MED ADMIN — piperacillin-tazobactam (ZOSYN) 3.375 g in sodium chloride 0.9 % (NS) 100 mL IVPB-MBP: INTRAVENOUS | @ 09:00:00 | Stop: 2022-02-06

## 2022-02-06 MED ADMIN — EPINEPHrine (ADRENALIN) injection: INTRAVENOUS | @ 10:00:00 | Stop: 2022-02-06

## 2022-02-06 MED ADMIN — phenylephrine 0.8 mg/10 mL (80 mcg/mL) injection: INTRAVENOUS | @ 08:00:00 | Stop: 2022-02-06

## 2022-02-06 MED ADMIN — vasopressin (VASOSTRICT) injection: INTRAVENOUS | @ 15:00:00 | Stop: 2022-02-06

## 2022-02-06 MED ADMIN — ROCuronium (ZEMURON) injection: INTRAVENOUS | @ 08:00:00 | Stop: 2022-02-06

## 2022-02-06 MED ADMIN — albumin human 5 % 5 % bottle 25 g: 25 g | INTRAVENOUS | @ 18:00:00 | Stop: 2022-02-06

## 2022-02-06 MED ADMIN — thrombin (recombinant) topical: TOPICAL | @ 10:00:00 | Stop: 2022-02-06

## 2022-02-06 MED ADMIN — EPINEPHrine (ADRENALIN) injection: INTRAVENOUS | @ 11:00:00 | Stop: 2022-02-06

## 2022-02-06 MED ADMIN — propofoL (DIPRIVAN) injection: INTRAVENOUS | @ 15:00:00 | Stop: 2022-02-06

## 2022-02-06 MED ADMIN — norepinephrine 8 mg in dextrose 5 % 250 mL (32 mcg/mL) infusion PMB: 0-30 ug/min | INTRAVENOUS | @ 17:00:00 | Stop: 2022-02-06

## 2022-02-06 MED ADMIN — phenylephrine 0.8 mg/10 mL (80 mcg/mL) injection: INTRAVENOUS | @ 14:00:00 | Stop: 2022-02-06

## 2022-02-06 MED ADMIN — vasopressin (VASOSTRICT) injection: INTRAVENOUS | @ 13:00:00 | Stop: 2022-02-06

## 2022-02-06 MED ADMIN — sodium chloride irrigation (NS) 0.9 % irrigation solution: @ 14:00:00 | Stop: 2022-02-06

## 2022-02-06 MED ADMIN — sodium chloride irrigation (NS) 0.9 % irrigation solution: @ 09:00:00 | Stop: 2022-02-06

## 2022-02-06 MED ADMIN — heparin 30,000 units (CELL SAVER) in 1000 mL NS: @ 09:00:00 | Stop: 2022-02-06

## 2022-02-06 MED ADMIN — methylPREDNISolone sodium succinate (PF) (Solu-MEDROL) 500 mg in sodium chloride (NS) 0.9 % 50 mL IVPB: 500 mg | INTRAVENOUS | @ 12:00:00 | Stop: 2022-02-06

## 2022-02-06 MED ADMIN — vasopressin (VASOSTRICT) injection: INTRAVENOUS | @ 14:00:00 | Stop: 2022-02-06

## 2022-02-06 MED ADMIN — piperacillin-tazobactam (ZOSYN) 3.375 g in sodium chloride 0.9 % (NS) 100 mL IVPB-MBP: 3.375 g | INTRAVENOUS | @ 20:00:00 | Stop: 2022-02-11

## 2022-02-06 MED ADMIN — fentaNYL (PF) (SUBLIMAZE) injection: INTRAVENOUS | @ 08:00:00 | Stop: 2022-02-06

## 2022-02-06 MED ADMIN — lidocaine (XYLOCAINE) 20 mg/mL (2 %) injection: INTRAVENOUS | @ 08:00:00 | Stop: 2022-02-06

## 2022-02-06 MED ADMIN — propofol (DIPRIVAN) infusion 10 mg/mL: 0-80 ug/kg/min | INTRAVENOUS | @ 23:00:00

## 2022-02-06 MED ADMIN — calcium chloride 100 mg/mL (10 %) injection: INTRAVENOUS | @ 12:00:00 | Stop: 2022-02-06

## 2022-02-06 MED ADMIN — tacrolimus (PROGRAF) oral suspension: 4 mg | GASTROENTERAL | @ 23:00:00

## 2022-02-06 MED ADMIN — heparin 1,000 units/500 mL (2 units/mL) in 0.9% NaCl infusion: @ 10:00:00 | Stop: 2022-02-06

## 2022-02-06 MED ADMIN — vasopressin (VASOSTRICT) injection: INTRAVENOUS | @ 12:00:00 | Stop: 2022-02-06

## 2022-02-06 MED ADMIN — aminocaproic acid (AMICAR) injection: INTRAVENOUS | @ 10:00:00 | Stop: 2022-02-06

## 2022-02-06 MED ADMIN — propofoL (DIPRIVAN) injection: INTRAVENOUS | @ 08:00:00 | Stop: 2022-02-06

## 2022-02-06 MED ADMIN — phenylephrine 0.8 mg/10 mL (80 mcg/mL) injection: INTRAVENOUS | @ 10:00:00 | Stop: 2022-02-06

## 2022-02-06 MED ADMIN — albumin human 5 % 5 % bottle: INTRAVENOUS | @ 09:00:00 | Stop: 2022-02-06

## 2022-02-06 MED ADMIN — vasopressin 20 units in 100 mL (0.2 units/mL) infusion premade vial: INTRAVENOUS | @ 11:00:00 | Stop: 2022-02-06

## 2022-02-06 MED ADMIN — vasopressin (VASOSTRICT) injection: INTRAVENOUS | @ 11:00:00 | Stop: 2022-02-06

## 2022-02-06 MED ADMIN — gelatin absorbable (GELFOAM) 100 sponge: TOPICAL | @ 10:00:00 | Stop: 2022-02-06

## 2022-02-06 MED ADMIN — cellulose, oxidized reg 4"X 8" pad: TOPICAL | @ 15:00:00 | Stop: 2022-02-06

## 2022-02-06 MED ADMIN — propofol (DIPRIVAN) infusion 10 mg/mL: 0-80 ug/kg/min | INTRAVENOUS | @ 17:00:00

## 2022-02-06 MED ADMIN — valGANciclovir (VALCYTE) oral solution: 450 mg | GASTROENTERAL | @ 21:00:00

## 2022-02-06 MED ADMIN — fentaNYL (PF) (SUBLIMAZE) injection: INTRAVENOUS | @ 14:00:00 | Stop: 2022-02-06

## 2022-02-06 MED ADMIN — insulin regular 100 unit/100 mL (1 unit/mL) in sodium chloride 0.9% 100 mL: 0-50 [IU]/h | INTRAVENOUS | @ 18:00:00

## 2022-02-06 MED ADMIN — lidocaine (XYLOCAINE) 10 mg/mL (1 %) injection: SUBCUTANEOUS | @ 07:00:00 | Stop: 2022-02-06

## 2022-02-06 MED ADMIN — electrolyte-A (PLASMA-LYT A) infusion: INTRAVENOUS | @ 08:00:00 | Stop: 2022-02-06

## 2022-02-06 MED ADMIN — heparin 30,000 units (CELL SAVER) in 1000 mL NS: @ 15:00:00 | Stop: 2022-02-06

## 2022-02-06 MED ADMIN — ROCuronium (ZEMURON) injection: INTRAVENOUS | @ 13:00:00 | Stop: 2022-02-06

## 2022-02-06 MED ADMIN — fentaNYL PF (SUBLIMAZE) (50 mcg/mL) infusion (bag): 0-200 ug/h | INTRAVENOUS | @ 17:00:00 | Stop: 2022-02-20

## 2022-02-06 MED ADMIN — calcium chloride 100 mg/mL (10 %) injection: INTRAVENOUS | @ 13:00:00 | Stop: 2022-02-06

## 2022-02-06 MED ADMIN — fentaNYL (PF) (SUBLIMAZE) injection: INTRAVENOUS | @ 15:00:00 | Stop: 2022-02-06

## 2022-02-06 MED ADMIN — melatonin tablet 3 mg: 3 mg | GASTROENTERAL | @ 22:00:00

## 2022-02-06 MED ADMIN — midazolam (VERSED) injection: INTRAVENOUS | @ 08:00:00 | Stop: 2022-02-06

## 2022-02-06 MED ADMIN — pantoprazole (PROTONIX) injection 40 mg: 40 mg | INTRAVENOUS | @ 20:00:00

## 2022-02-06 MED ADMIN — cellulose, oxidized reg 4"X 8" pad: TOPICAL | @ 10:00:00 | Stop: 2022-02-06

## 2022-02-06 MED ADMIN — sodium chloride irrigation (NS) 0.9 % irrigation solution: @ 13:00:00 | Stop: 2022-02-06

## 2022-02-06 MED ADMIN — nystatin (MYCOSTATIN) oral suspension: 10 mL | ORAL | @ 20:00:00

## 2022-02-06 MED ADMIN — norepinephrine 8 mg in dextrose 5 % 250 mL (32 mcg/mL) infusion PMB: INTRAVENOUS | @ 09:00:00 | Stop: 2022-02-06

## 2022-02-06 MED ADMIN — sodium chloride (NS) 0.9 % infusion: 100 mL/h | INTRAVENOUS | @ 19:00:00

## 2022-02-06 MED ADMIN — vasopressin (VASOSTRICT) injection: INTRAVENOUS | @ 16:00:00 | Stop: 2022-02-06

## 2022-02-06 MED ADMIN — insulin regular (HumuLIN,NovoLIN) injection: SUBCUTANEOUS | @ 14:00:00 | Stop: 2022-02-06

## 2022-02-06 MED ADMIN — sodium bicarbonate 1 mEq/mL (8.4 %) injection: INTRAVENOUS | @ 13:00:00 | Stop: 2022-02-06

## 2022-02-06 NOTE — Unmapped (Signed)
Transplant Surgery Progress Note    Date of service: 02/06/2022    Hospital Day:  LOS: 0 days   Surgery Date(s): 02/06/22  Consultation at the request of Surgical Attending Chirag Sureshchandra Des* for the evaluation and management of OLT.  ICU Attending: Dr. Leonette Most    Interval History:   Arrived to SICU intubated s/p OLT with Dr. Celine Mans.     Assessment/Plan:  Clifford Benninger is a 61 y.o. 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     Neurological:    *Pain and sedation  - Fentanyl and propofol gtts    Cardiovascular:   *MAP goal >65  - Off of pressors  - Would favor albumin bolusing to support MAPs over pressors    Pulmonary:   *Intubated on PRVC, compliant  - Daily CXR while intubated  - q2 ABGs    Renal/Genitourinary:  *Strict I&Os via foley catheter  - Trend BUN/Cr    GI/Nutrition:  F: 100cc/hr NS  E: Replete PRN  N: NPO; NGT to LIWS    *S/p OLT on 02/06/22  - q6 labs  - Liver transplant doppler  - Surgical drains to bulb suction x2    Heme:   DVT ppx: holding  - q6 CBC    Induction immunosuppression: methylpred and basiliximab  Maintenance immunosuppression: MMF and tac    ID:  - Zosyn    Dispo: ICU    Objective:    Physical Exam:    General: intubated and sedated  CV: Regular rate per telemetry  Resp: Compliant with PRVC ventilation  Abdomen: Soft, mildly distended, surgical drains w/ serosanguionous output x2  Extremities: WWP    Data Review:   I have reviewed the labs and studies from the last 24 hours.    Vitals Reviewed:    Temp:  [36.7 ??C (98.1 ??F)-37.1 ??C (98.8 ??F)] 36.7 ??C (98.1 ??F)  Heart Rate:  [89-105] 105  Resp:  [18] 18  BP: (105-129)/(43-55) 129/55  MAP (mmHg):  [62-64] 62  SpO2:  [98 %-99 %] 98 %  BMI (Calculated):  [31.65] 31.65   Temp (24hrs), Avg:36.9 ??C (98.4 ??F), Min:36.7 ??C (98.1 ??F), Max:37.1 ??C (98.8 ??F)     SpO2: 98 %   Height: 172.7 cm (5' 8)    Weight: 94.4 kg (208 lb 1.8 oz)    Body mass index is 31.64 kg/m??.    Body surface area is 2.13 meters squared.       Intake/Output Summary (Last 24 hours) at 02/06/2022 0932  Last data filed at 02/06/2022 0914  Gross per 24 hour   Intake 3765 ml   Output 800 ml   Net 2965 ml        I/O last 3 completed shifts:  In: 1300 [Blood:800; IV Piggyback:500]  Out: 50 [Blood:50]   I/O this shift:  In: 2465 [I.V.:800; Blood:1600; IV Piggyback:65]  Out: 750 [Urine:750]      Continuous Infusions:   ??? heparin (porcine)     ??? lactated Ringers           Hemodynamic/Invasive Device Data (24 hrs):               Ventilation/Oxygen Therapy (24hrs):       Tubes and Drains:  Patient Lines/Drains/Airways Status     Active Active Lines, Drains, & Airways     Name Placement date Placement time  Site Days    ETT  8 02/06/22  0345  -- less than 1    Introducer 02/06/22 Internal jugular Right 02/06/22  0410  Internal jugular  less than 1    NG/OG Tube Decompression 02/06/22  1610  --  less than 1    Urethral Catheter Non-latex 16 Fr. 02/06/22  0400  Non-latex  less than 1    Peripheral IV 02/05/22 Anterior;Left Forearm 02/05/22  1726  Forearm  less than 1    Peripheral IV 02/06/22 Right Hand 02/06/22  0424  Hand  less than 1    Arterial Line 02/06/22 Left Radial 02/06/22  0426  Radial  less than 1                Idalia Needle, MD  PGY-3 General Surgery

## 2022-02-06 NOTE — Unmapped (Signed)
POC reviewed. VSS. Calm and cooperative with care. Remained NPO. Blood glucose monitored. Up ad lib. Remained off of IVF. CHG bath administered. Report given to OR RN. No falls/injuries. No c/o pain. No further complaints/concerns.   Problem: Adult Inpatient Plan of Care  Goal: Plan of Care Review  Outcome: Progressing  Goal: Patient-Specific Goal (Individualized)  Outcome: Progressing  Goal: Absence of Hospital-Acquired Illness or Injury  Outcome: Progressing  Intervention: Identify and Manage Fall Risk  Recent Flowsheet Documentation  Taken 02/05/2022 2100 by Kae Heller, RN  Safety Interventions: fall reduction program maintained  Intervention: Prevent Skin Injury  Recent Flowsheet Documentation  Taken 02/05/2022 2100 by Kae Heller, RN  Skin Protection: adhesive use limited  Intervention: Prevent and Manage VTE (Venous Thromboembolism) Risk  Recent Flowsheet Documentation  Taken 02/05/2022 2100 by Kae Heller, RN  Activity Management: up ad lib  Intervention: Prevent Infection  Recent Flowsheet Documentation  Taken 02/05/2022 2100 by Kae Heller, RN  Infection Prevention: cohorting utilized  Goal: Optimal Comfort and Wellbeing  Outcome: Progressing  Goal: Readiness for Transition of Care  Outcome: Progressing  Goal: Rounds/Family Conference  Outcome: Progressing     Problem: Infection  Goal: Absence of Infection Signs and Symptoms  Outcome: Progressing  Intervention: Prevent or Manage Infection  Recent Flowsheet Documentation  Taken 02/05/2022 2100 by Kae Heller, RN  Infection Management: aseptic technique maintained  Isolation Precautions: enteric precautions maintained

## 2022-02-06 NOTE — Unmapped (Signed)
VENOUS ACCESS TEAM PROCEDURE    Order was placed for a PIV by Venous Access Team (VAT).  Patient was assessed at bedside for placement of a PIV. PPE were donned per protocol.  Access was obtained. Blood return noted.  Dressing intact and device well secured.  Flushed with normal saline.  See LDA for details.  Pt advised to inform RN of any s/s of discomfort at the PIV site. PIV was placed by VAT RN Tuesday Jean Rosenthal.    Workup / Procedure Time:  30 minutes       Care RN was notified.       Thank you,     Gillie Manners RN Venous Access Team

## 2022-02-06 NOTE — Unmapped (Signed)
Liver Transplant Pharmacy Progress Note  Date:  02/06/2022    Patient:  Matthew Key, Age:  61 y.o. old, Gender:  male    Allergies:  Venom-honey bee    S:  POD 1, s/p orthotopic liver transplant on 02/06/22, 2/2 cryptogenic cirrhosis (idiopathic) and Boerhaves   MELD 29, DBD  PMH: CVA, HLD, OSA on CPAP, T2DM, HTN     O:  Home medications:  Medications Prior to Admission   Medication Sig Note Dispense Refill Last Dose    acetaminophen (TYLENOL) 325 MG tablet Take 2 tablets (650 mg total) by mouth every six (6) hours as needed.   0 02/05/2022    atorvastatin (LIPITOR) 10 MG tablet Take 1 tablet (10 mg total) by mouth nightly.    02/05/2022    ciprofloxacin HCl (CIPRO) 500 MG tablet TAKE 1 TABLET BY MOUTH IN THE MORNING  30 tablet 0 02/05/2022    furosemide (LASIX) 20 MG tablet Take 2 tablets (40 mg total) by mouth daily. Restart on 6/23  60 tablet 0 02/05/2022    gabapentin (NEURONTIN) 300 MG capsule Take 1 capsule (300 mg total) by mouth at bedtime.  30 capsule 5 02/05/2022    hydrOXYzine (VISTARIL) 50 MG capsule Take 1 capsule (50 mg total) by mouth nightly as needed.    Past Week    insulin syringe-needle U-100 1 mL 31 gauge x 5/16 (8 mm) Syrg Use once as needed for Hydrocortisone injection.. 02/06/2022: Supplies 10 each 0 Past Week    lactulose 10 gram/15 mL solution Take 30 mL (20 g total) by mouth Three (3) times a day.  2700 mL 10 02/05/2022    ondansetron (ZOFRAN-ODT) 4 MG disintegrating tablet TAKE 1 TABLET BY MOUTH EVERY 6 HOURS FOR 7 DAYS    Past Month    ondansetron (ZOFRAN-ODT) 4 MG disintegrating tablet Take 1 tablet (4 mg total) by mouth every eight (8) hours as needed for nausea.  72 tablet 11 Past Month    pantoprazole (PROTONIX) 40 MG tablet Take 1 tablet (40 mg total) by mouth daily.  30 tablet 2 02/05/2022    rifAXIMin (XIFAXAN) 550 mg Tab Take 1 tablet (550 mg total) by mouth Two (2) times a day.  60 tablet 5 Past Week    sodium chloride (AYR) 0.65 % Drop 1 spray.    Past Month    spironolactone (ALDACTONE) 50 MG tablet Take 1 tablet (50 mg total) by mouth daily. Restart 6/23  30 tablet 0 02/05/2022    syringe with needle 3 mL 23 x 1 Syrg Use as needed for Hydrocortisone injection 02/06/2022: Supplies 1 each 0 Past Week    zinc sulfate 110 mg (25 mg elemental zinc) Tab Take 1 tablet (110 mg total) by mouth in the morning.  30 tablet 11 02/05/2022    pen needle, diabetic (BD ULTRA-FINE NANO PEN NEEDLE) 32 gauge x 5/32 (4 mm) Ndle ok to sub any brand or size needle preferred by insurance/patient, use 1-2x/day, dx E11.65 02/06/2022: Supplies 100 each 12     [EXPIRED] TRESIBA FLEXTOUCH U-100 100 unit/mL (3 mL) InPn Inject 0.3 mL (30 Units total) under the skin at bedtime. Adjust as instructed.  15 mL 0      Scheduled Medications:  albumin human 5 %, 25 g, Once  chlorhexidine, 15 mL, BID  famotidine, 20 mg, BID  melatonin, 3 mg, QPM      Continuous IV Fluids/Drips:  fentaNYL citrate (PF) 50 mcg/mL infusion, Last Rate: 25 mcg/hr (02/06/22  1315)  insulin regular, Last Rate: 8.5 Units/hr (02/06/22 1405)  lactated Ringers  norepinephrine bitartrate-NS, Last Rate: 6 mcg/min (02/06/22 1309)  propofol 10 mg/mL infusion, Last Rate: 40 mcg/kg/min (02/06/22 1312)  sodium chloride      PRN Medications:  calcium gluconate, 1 g, Q12H PRN  calcium gluconate, 2 g, Q12H PRN  dextrose in water, 0-50 g, Q1H PRN  fentaNYL (PF), 25 mcg, Q30 Min PRN   Or  fentaNYL (PF), 50 mcg, Q30 Min PRN  fentaNYL (PF), 25-200 mcg, Q1H PRN  haloperidol lactate, 5 mg, Q6H PRN  magnesium sulfate, 2 g, Q2H PRN  potassium chloride in water, 20 mEq, Q1H PRN  sodium phosphate, 15 mmol, Q12H PRN  sodium phosphate, 30 mmol, Q12H PRN      Hematology:   Recent Labs     02/05/22  1721   WBC 4.0     Chemistry:   Recent Labs     02/04/22  1512 02/05/22  1721   BUN 21 23   CREATININE 1.45* 1.46*     Recent Labs     02/04/22  1512 02/05/22  1721   ALT 24 23   AST 34 35*     Intake/Output:   Intake/Output Summary (Last 24 hours) at 02/06/2022 1408  Last data filed at 02/06/2022 1315  Gross per 24 hour   Intake 7365 ml   Output 2250 ml   Net 5115 ml     1. Induction Immunosuppression:  basiliximab.  Steroid Taper:  POD0: methylprednisolone 500 mg IV intraoperatively  POD1: methylprednisolone 500 mg IV x 1 dose   POD2: methylprednisolone 50 mg IV BID   POD3: methylprednisolone 40 mg IV BID   POD4: methylprednisolone 30 mg IV BID   POD5: methylprednisolone 20 mg IV BID   POD6: Prednisone 20 mg PO Q Day  POD7: Prednisone 15 mg PO Q Day  POD8: Prednisone 10 mg PO Q Day  POD9-14: Prednisone 5 mg PO Q Day   POD15-30: Prednisone 2.5 mg PO Q Day, then discontinue     2. Prophylactic Immunosuppression:  Triple therapy with CellCept 1000 mg PO BID, Prograf 4 mg PO BID, and steroids as listed above.   Goal Prograf: 8-10 ng/mL - plan to keep tacrolimus on board with basiliximab induction, but run the tac level lower for the first few days post-transplant    3. Prophylactic Antimicrobials:   CMV: Moderate risk for CMV D+/R+. Patient on Valcyte 450 mg PO daily. Anticipate 3 months of prophylaxis per transplant protocol. End date: 05/10/23  PJP: SMZ/TMP SS PO on Monday, Wednesday, and Friday. Anticipate 6 months of prophylaxis per transplant protocol. End date:  08/08/22  Nystatin suspension while inpatient    4. Hepatic: Tbili 0.9, ALT 300, AST 253; INR 1.23, albumin 3    5. Renal:  SCr recovering from AKI prior to transplant; SCr 1.75 mg/dL (trending up since pre-transplant); UOP 2.2 L    6. Pain: HM IV and oxycodone prn, methocarbamol PRN    7. Endo: On endotool    8. GI:  Bowel regimen not yet started     9. Ppx: holding SQH 5,000 units q8hr, pantoprazole 40 mg IV daily, holding aspirin 81 mg PO daily    Medication Reconciliation: Not completed; Home medications not yet restarted: atorvastatin 10 mg, duloxetine 30 mg, gabapentin 300 mg    Spent 20 minutes completing medication therapy management, medication reconciliation, and discussing care with members of the multidisciplinary transplant team.    Thank  you,  Sampson Goon, PharmD Candidate  [employer], PharmD

## 2022-02-06 NOTE — Unmapped (Addendum)
Matthew Key is a 61 y.o. 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 Celine Mans.    Intraoperative findings are significant for mild ascites, neovascularization from the diaphragm, and a cirrhotic liver.    #Liver Transplant  Patient transferred to SICU intubated for routine post-liver transplant cares.  On postoperative day 1 patient was passing SBT and was extubated to nasal cannula without consequence.  Patient progressed well while in the surgical ICU with LFTs improving and liver transplant ultrasound within normal limits. Transferred to ISCU on 7/1 and then Floor on 7/2. LFTs downtrended thru 7/2, then fluctuated. Repeat LUS 7/3 with patent vasculature.     #Urinary retention  Patient failed initial trial of void.  Patient was initiated on Flomax and Foley was replaced for 3 days and repeated on 02/12/2022 which patient passed.    At the time of discharge the patient was voiding, tolerating a Nutrition Therapy Regular/House; No Carbonation diet, was ambulatory, and reported good pain control with PO medications. The patient will be discharged home on  LOS: 5 days  8 Days Post-Op in good condition.       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 Has not required iHD post op. Cr ***at discharge and making *** UOP.   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 Repeat LUS 7/3 with patent vasculature.   Immunosuppression:  -Induction: Basiliximab (Simulect) and Methylpred  -Tac at dc: ***mg BID  -Cellcept at dc: 1000mg  BID  -Pred at dc: taper down to 2.5 x 16 doses then off.  Prophylaxis:  -PJP: Bactrim SS x 6 months, EOT 08/08/22  -CMV: CMV (D-/R+ or D+/R+): Moderate Risk, 450mg  Valcyte daily x 3 months, renally dose adjusted, EOT 05/09/22  -ASA 81 at dc: yes  Endocrine  -30u tresiba every morning  -4u Nutritional with 2:50>150 SSI

## 2022-02-06 NOTE — Unmapped (Signed)
Spoke with pts spouse, Cordelia Pen and gave her patient room number and enc her to go to SICU and request to see patient. Nurse, children's. She mentioned going to [employer] (referral entered by Marchelle Folks, txp coord) later today and enc her to ask for SICU contact info so can call tonight or in the AM.

## 2022-02-06 NOTE — Unmapped (Signed)
Delisted Patient in UNOS due to Liver Transplant on 02/06/22 at 09:16.

## 2022-02-06 NOTE — Unmapped (Signed)
Auth for liver transplant inpt admission Berkley Harvey has been requested through [employer].   Ref# [id number]. Status PENDED.

## 2022-02-06 NOTE — Unmapped (Signed)
Surgical ICU Admission Note    Consulting attending: Dr. Helyn Numbers  Consulting service: Trauma/Critical Care  Contact pager: [phone]     Requesting attending: Phillips Grout Des*  Requesting service: Transplant Surgery [facility])    Assessment:   61 y/o M with a PMH of crytogenic cirrhosis s/p DDLT 6/28 who presents to the SICU for postoperative care.     Plan: Transfer to the SICU under Trauma Surgery/Critical Care Service for critical care management.    Neuro: Awake, oriented, appropriate. Pain well-controlled on current regimen.  - Fentanyl gtt for analgesia  - Propofol gtt for sedation    CV: hypotensive, requiring norepinephrine  - wean vasopressors (hold off vasopressin)    Pulm: Mechanically ventilated.  - SBT 6/29  - q2h ABG    FEN/GI:  F: IVF with NS @ 159mL/hr  E: replete electrolytes PRN  N: NPO/NGT    * s/p DDLT  - 2 drains:   - Lateral (hepatic dome)   - Medial (porta hepatis)  - Immunosuppression and anti-rejection meds per SRF    Renal/GU:   - Foley for accurate I/Os in critically ill patient  - q6h BMP    Heme: H/H stable.  - q6h CBC    ID: Afebrile.     Endo: Hyperglycemia  - Endotool    MSK: NAI    Wound: Mercedes incision    Lines, tubes, drains: ETT, arterial line (left radial), central venous line (RIJ), Foley, 2 PIV, NGT    Daily Care Checklish:            Stress Ulcer Prevention:           DVT Prophylaxis:  Holding post transplant            HOB > 30 degrees: yes             Daily Awakening:  Yes           Spontaneous Breathing Trial: yes           Continued Beta Blockade:  no           Continued need for central/PICC line : yes  hemodynamic monitoring and critically ill requiring fluid resuscitation           Continue urinary catheter for: yes  strict intake and output    Dispo: Admit to SICU.     History of Present Illness:     Chief Complaint: Cryptogenic Cirrhosis     Matthew Key is a 61 y.o. 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 The patient was taken to the OR on 6/28. Intraoperatively, he required the following resuscitation products: 4u pRBC, 6u FFP, 1u cryoprecipitate, 1u platelets, 500cc albumin, 5L crystalloid. Upon admission to the SICU, the patient was intubated/sedated, on 43mcg/min of norepinephrine, and had an arterial lactate of 5.1.      Allergies:  Venom-honey bee    Home Medications:   Medications Prior to Admission   Medication Sig Dispense Refill Last Dose    acetaminophen (TYLENOL) 325 MG tablet Take 2 tablets (650 mg total) by mouth every six (6) hours as needed.  0 02/05/2022    atorvastatin (LIPITOR) 10 MG tablet Take 1 tablet (10 mg total) by mouth nightly.   02/05/2022    ciprofloxacin HCl (CIPRO) 500 MG tablet TAKE 1 TABLET BY MOUTH IN THE MORNING 30 tablet 0 02/05/2022    furosemide (LASIX) 20 MG  tablet Take 2 tablets (40 mg total) by mouth daily. Restart on 6/23 60 tablet 0 02/05/2022    gabapentin (NEURONTIN) 300 MG capsule Take 1 capsule (300 mg total) by mouth at bedtime. 30 capsule 5 02/05/2022    hydrOXYzine (VISTARIL) 50 MG capsule Take 1 capsule (50 mg total) by mouth nightly as needed.   Past Week    insulin syringe-needle U-100 1 mL 31 gauge x 5/16 (8 mm) Syrg Use once as needed for Hydrocortisone injection.. 10 each 0 Past Week    lactulose 10 gram/15 mL solution Take 30 mL (20 g total) by mouth Three (3) times a day. 2700 mL 10 02/05/2022    ondansetron (ZOFRAN-ODT) 4 MG disintegrating tablet TAKE 1 TABLET BY MOUTH EVERY 6 HOURS FOR 7 DAYS   Past Month    ondansetron (ZOFRAN-ODT) 4 MG disintegrating tablet Take 1 tablet (4 mg total) by mouth every eight (8) hours as needed for nausea. 72 tablet 11 Past Month    pantoprazole (PROTONIX) 40 MG tablet Take 1 tablet (40 mg total) by mouth daily. 30 tablet 2 02/05/2022    rifAXIMin (XIFAXAN) 550 mg Tab Take 1 tablet (550 mg total) by mouth Two (2) times a day. 60 tablet 5 Past Week    sodium chloride (AYR) 0.65 % Drop 1 spray.   Past Month spironolactone (ALDACTONE) 50 MG tablet Take 1 tablet (50 mg total) by mouth daily. Restart 6/23 30 tablet 0 02/05/2022    syringe with needle 3 mL 23 x 1 Syrg Use as needed for Hydrocortisone injection 1 each 0 Past Week    zinc sulfate 110 mg (25 mg elemental zinc) Tab Take 1 tablet (110 mg total) by mouth in the morning. 30 tablet 11 02/05/2022    pen needle, diabetic (BD ULTRA-FINE NANO PEN NEEDLE) 32 gauge x 5/32 (4 mm) Ndle ok to sub any brand or size needle preferred by insurance/patient, use 1-2x/day, dx E11.65 100 each 12     [EXPIRED] TRESIBA FLEXTOUCH U-100 100 unit/mL (3 mL) InPn Inject 0.3 mL (30 Units total) under the skin at bedtime. Adjust as instructed. 15 mL 0        Medical History:  Past Medical History:   Diagnosis Date    AKI (acute kidney injury) (CMS-HCC) 12/14/2020    Anxiety 10/22/2013    Arthritis     Cervical radiculopathy 12/03/2016    Chronic pain disorder     Lower back    Cirrhosis (CMS-HCC)     Dental abscess 10/2020    Duodenal ulcer 12/01/2017    GERD (gastroesophageal reflux disease)     History of transfusion     Hyperlipidemia 10/22/2013    Hypertension     under control with meds and weight loss    Hypotension 01/29/2022    Liver disease     Sleep apnea, obstructive     Have machine    Stroke (CMS-HCC)     mild stroke    Type 2 diabetes mellitus with diabetic neuropathy, with long-term current use of insulin (CMS-HCC) 06/09/2014       Surgical History:  Past Surgical History:   Procedure Laterality Date    KNEE SURGERY      PR CATH PLACE/CORON ANGIO, IMG SUPER/INTERP,R&L HRT CATH, L HRT VENTRIC N/A 12/19/2020    Procedure: CATH LEFT/RIGHT HEART CATHETERIZATION;  Surgeon: Rosana Hoes, MD;  Location: [facility] CATH;  Service: Cardiology    PR COLONOSCOPY FLX DX W/COLLJ [facility] WHEN PFRMD N/A 12/07/2021  Procedure: COLONOSCOPY, FLEXIBLE, PROXIMAL TO SPLENIC FLEXURE; DIAGNOSTIC, W/WO COLLECTION SPECIMEN BY BRUSH OR WASH;  Surgeon: Annie Paras, MD;  Location: [facility];  Service: Gastroenterology    PR UPPER GI ENDOSCOPY,BIOPSY N/A 10/12/2020    Procedure: UGI ENDOSCOPY; WITH BIOPSY, SINGLE OR MULTIPLE;  Surgeon: Marene Lenz, MD;  Location: [facility];  Service: Gastroenterology    PR UPPER GI ENDOSCOPY,DIAGNOSIS N/A 01/03/2022    Procedure: UGI ENDO, INCLUDE ESOPHAGUS, STOMACH, & DUODENUM &/OR JEJUNUM; DX W/WO COLLECTION SPECIMN, BY BRUSH OR WASH;  Surgeon: Marene Lenz, MD;  [facility]: [facility];  Service: Gastroenterology    PR UPPER GI ENDOSCOPY,LIGAT VARIX N/A 12/07/2021    Procedure: UGI ENDO; Everlene Balls LIG ESOPH &/OR GASTRIC VARICES;  Surgeon: Annie Paras, MD;  Location: [facility];  Service: Gastroenterology    ROOT CANAL      Front teeth       Social History:  Tobacco use:   reports that he has never smoked. He has never used smokeless tobacco.  Alcohol use:   reports that he does not currently use alcohol.  Drug use:  reports that he does not currently use drugs.    Family History:     Family History   Problem Relation Age of Onset    Edema Mother      Alzheimer's disease Father      Aortic dissection Brother      Early death Brother      Aneurysm Brother        Review of Systems:  Review of systems was unobtainable due to patient factors.    Objective  Vitals Reviewed:    Temp:  [36.7 ??C (98.1 ??F)-37.1 ??C (98.8 ??F)] 36.7 ??C (98.1 ??F)  Heart Rate:  [89-105] 105  Resp:  [18] 18  BP: (105-129)/(43-55) 129/55  MAP (mmHg):  [62-64] 62  SpO2:  [98 %-99 %] 98 %  BMI (Calculated):  [31.65] 31.65   Temp (24hrs), Avg:36.9 ??C (98.4 ??F), Min:36.7 ??C (98.1 ??F), Max:37.1 ??C (98.8 ??F)     SpO2: 98 %   Height: 172.7 cm (5' 8)    Weight: 94.4 kg (208 lb 1.8 oz)    Body mass index is 31.64 kg/m??.    Body surface area is 2.13 meters squared.     Intake/Output Summary (Last 24 hours) at 02/06/2022 1241  Last data filed at 02/06/2022 1216  Gross per 24 hour   Intake 7365 ml   Output 2150 ml   Net 5215 ml        I/O last 3 completed shifts:  In: 1300 [Blood:800; IV Piggyback:500]  Out: 50 [Blood:50]   I/O this shift:  In: 6065 [I.V.:4400; Blood:1600; IV Piggyback:65]  Out: 2100 [Urine:900; Blood:1200]    Continuous Infusions:    lactated Ringers       Tubes and Drains:  Urethral Catheter Non-latex 16 Fr. (Active)   Number of days: 0     Introducer 02/06/22 Internal jugular Right (Active)   Number of days: 0        ETT  8 (Active)   Secured at 25 cm 02/05/22 0006   Placement Assessment Equal Bilateral Breath Sounds;Positive ETCO2 02/05/22 0006   Number of days: 0          Physical Exam:    GEN: Intubated, sedated  CV: regular rate and rhythm  RESP: Mechanically ventilated; equal chest rise  ABD: Soft, non-distended; dressings over Mercedes incision;  2 right-sided abdominal drains with sanguineous drainage  GU: Foley in place  EXT: Warm, well-perfused, no edema  NEURO: sedated    Labs/Studies: Reviewed in EPIC.

## 2022-02-07 LAB — CBC
HEMATOCRIT: 27.4 % — ABNORMAL LOW (ref 39.0–48.0)
HEMATOCRIT: 27 % — ABNORMAL LOW (ref 39.0–48.0)
HEMATOCRIT: 24.1 % — ABNORMAL LOW (ref 39.0–48.0)
HEMATOCRIT: 24.2 % — ABNORMAL LOW (ref 39.0–48.0)
HEMOGLOBIN: 9.8 g/dL — ABNORMAL LOW (ref 12.9–16.5)
HEMOGLOBIN: 9.5 g/dL — ABNORMAL LOW (ref 12.9–16.5)
HEMOGLOBIN: 8.5 g/dL — ABNORMAL LOW (ref 12.9–16.5)
HEMOGLOBIN: 8.4 g/dL — ABNORMAL LOW (ref 12.9–16.5)
MEAN CORPUSCULAR HEMOGLOBIN CONC: 35.8 g/dL (ref 32.0–36.0)
MEAN CORPUSCULAR HEMOGLOBIN CONC: 35.2 g/dL (ref 32.0–36.0)
MEAN CORPUSCULAR HEMOGLOBIN CONC: 35.3 g/dL (ref 32.0–36.0)
MEAN CORPUSCULAR HEMOGLOBIN CONC: 34.8 g/dL (ref 32.0–36.0)
MEAN CORPUSCULAR HEMOGLOBIN: 33.7 pg — ABNORMAL HIGH (ref 25.9–32.4)
MEAN CORPUSCULAR HEMOGLOBIN: 33.2 pg — ABNORMAL HIGH (ref 25.9–32.4)
MEAN CORPUSCULAR HEMOGLOBIN: 33.8 pg — ABNORMAL HIGH (ref 25.9–32.4)
MEAN CORPUSCULAR HEMOGLOBIN: 33.2 pg — ABNORMAL HIGH (ref 25.9–32.4)
MEAN CORPUSCULAR VOLUME: 94.1 fL (ref 77.6–95.7)
MEAN CORPUSCULAR VOLUME: 94.3 fL (ref 77.6–95.7)
MEAN CORPUSCULAR VOLUME: 95.8 fL — ABNORMAL HIGH (ref 77.6–95.7)
MEAN CORPUSCULAR VOLUME: 95.3 fL (ref 77.6–95.7)
MEAN PLATELET VOLUME: 10.8 fL — ABNORMAL HIGH (ref 6.8–10.7)
MEAN PLATELET VOLUME: 10.2 fL (ref 6.8–10.7)
MEAN PLATELET VOLUME: 9.8 fL (ref 6.8–10.7)
MEAN PLATELET VOLUME: 9.6 fL (ref 6.8–10.7)
PLATELET COUNT: 31 10*9/L — ABNORMAL LOW (ref 150–450)
PLATELET COUNT: 30 10*9/L — ABNORMAL LOW (ref 150–450)
PLATELET COUNT: 19 10*9/L — ABNORMAL LOW (ref 150–450)
PLATELET COUNT: 17 10*9/L — ABNORMAL LOW (ref 150–450)
RED BLOOD CELL COUNT: 2.91 10*12/L — ABNORMAL LOW (ref 4.26–5.60)
RED BLOOD CELL COUNT: 2.86 10*12/L — ABNORMAL LOW (ref 4.26–5.60)
RED BLOOD CELL COUNT: 2.51 10*12/L — ABNORMAL LOW (ref 4.26–5.60)
RED BLOOD CELL COUNT: 2.54 10*12/L — ABNORMAL LOW (ref 4.26–5.60)
RED CELL DISTRIBUTION WIDTH: 19 % — ABNORMAL HIGH (ref 12.2–15.2)
RED CELL DISTRIBUTION WIDTH: 18.8 % — ABNORMAL HIGH (ref 12.2–15.2)
RED CELL DISTRIBUTION WIDTH: 18.6 % — ABNORMAL HIGH (ref 12.2–15.2)
RED CELL DISTRIBUTION WIDTH: 19.2 % — ABNORMAL HIGH (ref 12.2–15.2)
WBC ADJUSTED: 8.7 10*9/L (ref 3.6–11.2)
WBC ADJUSTED: 9.7 10*9/L (ref 3.6–11.2)
WBC ADJUSTED: 6.5 10*9/L (ref 3.6–11.2)
WBC ADJUSTED: 5.9 10*9/L (ref 3.6–11.2)

## 2022-02-07 LAB — BLOOD GAS, ARTERIAL
BASE EXCESS ARTERIAL: -1.7 (ref -2.0–2.0)
BASE EXCESS ARTERIAL: -1.4 (ref -2.0–2.0)
BASE EXCESS ARTERIAL: -2.5 — ABNORMAL LOW (ref -2.0–2.0)
BASE EXCESS ARTERIAL: -2.5 — ABNORMAL LOW (ref -2.0–2.0)
BASE EXCESS ARTERIAL: -4 — ABNORMAL LOW (ref -2.0–2.0)
BASE EXCESS ARTERIAL: -5.1 — ABNORMAL LOW (ref -2.0–2.0)
BASE EXCESS ARTERIAL: -6.6 — ABNORMAL LOW (ref -2.0–2.0)
BASE EXCESS ARTERIAL: -9.7 — ABNORMAL LOW (ref -2.0–2.0)
FIO2 ARTERIAL: 40
FIO2 ARTERIAL: 40
FIO2 ARTERIAL: 40
FIO2 ARTERIAL: 40
FIO2 ARTERIAL: 40
FIO2 ARTERIAL: 40
FIO2 ARTERIAL: 40
FIO2 ARTERIAL: 40
HCO3 ARTERIAL: 22 mmol/L (ref 22–27)
HCO3 ARTERIAL: 23 mmol/L (ref 22–27)
HCO3 ARTERIAL: 21 mmol/L — ABNORMAL LOW (ref 22–27)
HCO3 ARTERIAL: 23 mmol/L (ref 22–27)
HCO3 ARTERIAL: 21 mmol/L — ABNORMAL LOW (ref 22–27)
HCO3 ARTERIAL: 21 mmol/L — ABNORMAL LOW (ref 22–27)
HCO3 ARTERIAL: 19 mmol/L — ABNORMAL LOW (ref 22–27)
HCO3 ARTERIAL: 16 mmol/L — ABNORMAL LOW (ref 22–27)
O2 SATURATION ARTERIAL: 99.3 % (ref 94.0–100.0)
O2 SATURATION ARTERIAL: 99.4 % (ref 94.0–100.0)
O2 SATURATION ARTERIAL: 99.3 % (ref 94.0–100.0)
O2 SATURATION ARTERIAL: 99.3 % (ref 94.0–100.0)
O2 SATURATION ARTERIAL: 99.6 % (ref 94.0–100.0)
O2 SATURATION ARTERIAL: 98 % (ref 94.0–100.0)
O2 SATURATION ARTERIAL: 98.2 % (ref 94.0–100.0)
O2 SATURATION ARTERIAL: 92.9 % — ABNORMAL LOW (ref 94.0–100.0)
PCO2 ARTERIAL: 33.1 mmHg — ABNORMAL LOW (ref 35.0–45.0)
PCO2 ARTERIAL: 36.5 mmHg (ref 35.0–45.0)
PCO2 ARTERIAL: 34 mmHg — ABNORMAL LOW (ref 35.0–45.0)
PCO2 ARTERIAL: 39.8 mmHg (ref 35.0–45.0)
PCO2 ARTERIAL: 34.4 mmHg — ABNORMAL LOW (ref 35.0–45.0)
PCO2 ARTERIAL: 40.3 mmHg (ref 35.0–45.0)
PCO2 ARTERIAL: 37 mmHg (ref 35.0–45.0)
PCO2 ARTERIAL: 32.4 mmHg — ABNORMAL LOW (ref 35.0–45.0)
PH ARTERIAL: 7.44 (ref 7.35–7.45)
PH ARTERIAL: 7.41 (ref 7.35–7.45)
PH ARTERIAL: 7.42 (ref 7.35–7.45)
PH ARTERIAL: 7.37 (ref 7.35–7.45)
PH ARTERIAL: 7.38 (ref 7.35–7.45)
PH ARTERIAL: 7.32 — ABNORMAL LOW (ref 7.35–7.45)
PH ARTERIAL: 7.32 — ABNORMAL LOW (ref 7.35–7.45)
PH ARTERIAL: 7.3 — ABNORMAL LOW (ref 7.35–7.45)
PO2 ARTERIAL: 130 mmHg — ABNORMAL HIGH (ref 80.0–110.0)
PO2 ARTERIAL: 119 mmHg — ABNORMAL HIGH (ref 80.0–110.0)
PO2 ARTERIAL: 114 mmHg — ABNORMAL HIGH (ref 80.0–110.0)
PO2 ARTERIAL: 118 mmHg — ABNORMAL HIGH (ref 80.0–110.0)
PO2 ARTERIAL: 127 mmHg — ABNORMAL HIGH (ref 80.0–110.0)
PO2 ARTERIAL: 101 mmHg (ref 80.0–110.0)
PO2 ARTERIAL: 100 mmHg (ref 80.0–110.0)
PO2 ARTERIAL: 67.8 mmHg — ABNORMAL LOW (ref 80.0–110.0)

## 2022-02-07 LAB — BASIC METABOLIC PANEL
ANION GAP: 6 mmol/L (ref 5–14)
ANION GAP: 9 mmol/L (ref 5–14)
ANION GAP: 6 mmol/L (ref 5–14)
ANION GAP: 7 mmol/L (ref 5–14)
BLOOD UREA NITROGEN: 26 mg/dL — ABNORMAL HIGH (ref 9–23)
BLOOD UREA NITROGEN: 26 mg/dL — ABNORMAL HIGH (ref 9–23)
BLOOD UREA NITROGEN: 28 mg/dL — ABNORMAL HIGH (ref 9–23)
BLOOD UREA NITROGEN: 34 mg/dL — ABNORMAL HIGH (ref 9–23)
BUN / CREAT RATIO: 16
BUN / CREAT RATIO: 15
BUN / CREAT RATIO: 16
BUN / CREAT RATIO: 17
CALCIUM: 8.3 mg/dL — ABNORMAL LOW (ref 8.7–10.4)
CALCIUM: 8.4 mg/dL — ABNORMAL LOW (ref 8.7–10.4)
CALCIUM: 8.2 mg/dL — ABNORMAL LOW (ref 8.7–10.4)
CALCIUM: 8.3 mg/dL — ABNORMAL LOW (ref 8.7–10.4)
CHLORIDE: 111 mmol/L — ABNORMAL HIGH (ref 98–107)
CHLORIDE: 112 mmol/L — ABNORMAL HIGH (ref 98–107)
CHLORIDE: 112 mmol/L — ABNORMAL HIGH (ref 98–107)
CHLORIDE: 110 mmol/L — ABNORMAL HIGH (ref 98–107)
CO2: 23 mmol/L (ref 20.0–31.0)
CO2: 22 mmol/L (ref 20.0–31.0)
CO2: 23 mmol/L (ref 20.0–31.0)
CO2: 22 mmol/L (ref 20.0–31.0)
CREATININE: 1.63 mg/dL — ABNORMAL HIGH
CREATININE: 1.75 mg/dL — ABNORMAL HIGH
CREATININE: 1.78 mg/dL — ABNORMAL HIGH
CREATININE: 2.01 mg/dL — ABNORMAL HIGH
EGFR CKD-EPI (2021) MALE: 47 mL/min/{1.73_m2} — ABNORMAL LOW (ref >=60)
EGFR CKD-EPI (2021) MALE: 43 mL/min/{1.73_m2} — ABNORMAL LOW (ref >=60)
EGFR CKD-EPI (2021) MALE: 43 mL/min/{1.73_m2} — ABNORMAL LOW (ref >=60)
EGFR CKD-EPI (2021) MALE: 37 mL/min/{1.73_m2} — ABNORMAL LOW (ref >=60)
GLUCOSE RANDOM: 156 mg/dL — ABNORMAL HIGH (ref 70–99)
GLUCOSE RANDOM: 173 mg/dL — ABNORMAL HIGH (ref 70–99)
GLUCOSE RANDOM: 198 mg/dL — ABNORMAL HIGH (ref 70–99)
GLUCOSE RANDOM: 220 mg/dL — ABNORMAL HIGH (ref 70–99)
POTASSIUM: 3.8 mmol/L (ref 3.4–4.8)
POTASSIUM: 4 mmol/L (ref 3.4–4.8)
POTASSIUM: 4.1 mmol/L (ref 3.4–4.8)
POTASSIUM: 4.1 mmol/L (ref 3.4–4.8)
SODIUM: 140 mmol/L (ref 135–145)
SODIUM: 143 mmol/L (ref 135–145)
SODIUM: 141 mmol/L (ref 135–145)
SODIUM: 139 mmol/L (ref 135–145)

## 2022-02-07 LAB — ENDOTOOL
ENDOTOOL GLUCOSE: 120 mg/dL — ABNORMAL LOW (ref 140–180)
ENDOTOOL GLUCOSE: 132 mg/dL — ABNORMAL LOW (ref 140–180)
ENDOTOOL GLUCOSE: 137 mg/dL — ABNORMAL LOW (ref 140–180)
ENDOTOOL GLUCOSE: 141 mg/dL (ref 140–180)
ENDOTOOL GLUCOSE: 147 mg/dL (ref 140–180)
ENDOTOOL GLUCOSE: 147 mg/dL (ref 140–180)
ENDOTOOL GLUCOSE: 147 mg/dL (ref 140–180)
ENDOTOOL GLUCOSE: 158 mg/dL (ref 140–180)
ENDOTOOL GLUCOSE: 162 mg/dL (ref 140–180)
ENDOTOOL GLUCOSE: 166 mg/dL (ref 140–180)
ENDOTOOL GLUCOSE: 169 mg/dL (ref 140–180)
ENDOTOOL GLUCOSE: 95 mg/dL — ABNORMAL LOW (ref 140–180)
ENDOTOOL INSULIN RATE: 0 U/h
ENDOTOOL INSULIN RATE: 0 U/h
ENDOTOOL INSULIN RATE: 1.3 U/h
ENDOTOOL INSULIN RATE: 2 U/h
ENDOTOOL INSULIN RATE: 2 U/h
ENDOTOOL INSULIN RATE: 2 U/h
ENDOTOOL INSULIN RATE: 2.2 U/h
ENDOTOOL INSULIN RATE: 2.4 U/h
ENDOTOOL INSULIN RATE: 2.4 U/h
ENDOTOOL INSULIN RATE: 2.6 U/h
ENDOTOOL INSULIN RATE: 2.6 U/h
ENDOTOOL INSULIN RATE: 3 U/h

## 2022-02-07 LAB — HEPATIC FUNCTION PANEL
ALBUMIN: 2.7 g/dL — ABNORMAL LOW (ref 3.4–5.0)
ALBUMIN: 3 g/dL — ABNORMAL LOW (ref 3.4–5.0)
ALBUMIN: 3.6 g/dL (ref 3.4–5.0)
ALBUMIN: 3.8 g/dL (ref 3.4–5.0)
ALKALINE PHOSPHATASE: 52 U/L (ref 46–116)
ALKALINE PHOSPHATASE: 48 U/L (ref 46–116)
ALKALINE PHOSPHATASE: 47 U/L (ref 46–116)
ALKALINE PHOSPHATASE: 49 U/L (ref 46–116)
ALT (SGPT): 321 U/L — ABNORMAL HIGH (ref 10–49)
ALT (SGPT): 300 U/L — ABNORMAL HIGH (ref 10–49)
ALT (SGPT): 251 U/L — ABNORMAL HIGH (ref 10–49)
ALT (SGPT): 219 U/L — ABNORMAL HIGH (ref 10–49)
AST (SGOT): 340 U/L — ABNORMAL HIGH (ref <=34)
AST (SGOT): 253 U/L — ABNORMAL HIGH (ref <=34)
AST (SGOT): 191 U/L — ABNORMAL HIGH (ref <=34)
AST (SGOT): 140 U/L — ABNORMAL HIGH (ref <=34)
BILIRUBIN DIRECT: 1 mg/dL — ABNORMAL HIGH (ref 0.00–0.30)
BILIRUBIN DIRECT: 0.9 mg/dL — ABNORMAL HIGH (ref 0.00–0.30)
BILIRUBIN DIRECT: 0.9 mg/dL — ABNORMAL HIGH (ref 0.00–0.30)
BILIRUBIN DIRECT: 0.8 mg/dL — ABNORMAL HIGH (ref 0.00–0.30)
BILIRUBIN TOTAL: 1.5 mg/dL — ABNORMAL HIGH (ref 0.3–1.2)
BILIRUBIN TOTAL: 1.2 mg/dL (ref 0.3–1.2)
BILIRUBIN TOTAL: 1.2 mg/dL (ref 0.3–1.2)
BILIRUBIN TOTAL: 1 mg/dL (ref 0.3–1.2)
PROTEIN TOTAL: 4.8 g/dL — ABNORMAL LOW (ref 5.7–8.2)
PROTEIN TOTAL: 5 g/dL — ABNORMAL LOW (ref 5.7–8.2)
PROTEIN TOTAL: 5.3 g/dL — ABNORMAL LOW (ref 5.7–8.2)
PROTEIN TOTAL: 5.4 g/dL — ABNORMAL LOW (ref 5.7–8.2)

## 2022-02-07 LAB — PROTIME-INR
INR: 1.34
INR: 1.23
PROTIME: 15.3 s — ABNORMAL HIGH (ref 9.8–12.8)
PROTIME: 14 s — ABNORMAL HIGH (ref 9.8–12.8)

## 2022-02-07 LAB — MAGNESIUM
MAGNESIUM: 2.3 mg/dL (ref 1.6–2.6)
MAGNESIUM: 2.2 mg/dL (ref 1.6–2.6)
MAGNESIUM: 2.2 mg/dL (ref 1.6–2.6)
MAGNESIUM: 2.2 mg/dL (ref 1.6–2.6)

## 2022-02-07 LAB — BLOOD GAS CRITICAL CARE PANEL, ARTERIAL
BASE EXCESS ARTERIAL: -5.4 — ABNORMAL LOW (ref -2.0–2.0)
BASE EXCESS ARTERIAL: -7.7 — ABNORMAL LOW (ref -2.0–2.0)
CALCIUM IONIZED ARTERIAL (MG/DL): 4.89 mg/dL (ref 4.40–5.40)
CALCIUM IONIZED ARTERIAL (MG/DL): 4.36 mg/dL — ABNORMAL LOW (ref 4.40–5.40)
GLUCOSE WHOLE BLOOD: 195 mg/dL — ABNORMAL HIGH (ref 70–179)
GLUCOSE WHOLE BLOOD: 187 mg/dL — ABNORMAL HIGH (ref 70–179)
HCO3 ARTERIAL: 21 mmol/L — ABNORMAL LOW (ref 22–27)
HCO3 ARTERIAL: 17 mmol/L — ABNORMAL LOW (ref 22–27)
HEMOGLOBIN BLOOD GAS: 8.9 g/dL — ABNORMAL LOW (ref 13.50–17.50)
HEMOGLOBIN BLOOD GAS: 7.6 g/dL — ABNORMAL LOW
LACTATE BLOOD ARTERIAL: 0.5 mmol/L (ref <1.3)
LACTATE BLOOD ARTERIAL: 0.6 mmol/L (ref <1.3)
O2 SATURATION ARTERIAL: 97.5 % (ref 94.0–100.0)
O2 SATURATION ARTERIAL: 97.1 % (ref 94.0–100.0)
PCO2 ARTERIAL: 46.6 mmHg — ABNORMAL HIGH (ref 35.0–45.0)
PCO2 ARTERIAL: 35.4 mmHg (ref 35.0–45.0)
PH ARTERIAL: 7.27 — ABNORMAL LOW (ref 7.35–7.45)
PH ARTERIAL: 7.31 — ABNORMAL LOW (ref 7.35–7.45)
PO2 ARTERIAL: 97 mmHg (ref 80.0–110.0)
PO2 ARTERIAL: 91.1 mmHg (ref 80.0–110.0)
POTASSIUM WHOLE BLOOD: 3.9 mmol/L (ref 3.4–4.6)
POTASSIUM WHOLE BLOOD: 3.5 mmol/L (ref 3.4–4.6)
SODIUM WHOLE BLOOD: 142 mmol/L (ref 135–145)
SODIUM WHOLE BLOOD: 143 mmol/L (ref 135–145)

## 2022-02-07 LAB — CALCIUM, IONIZED, ARTERIAL
CALCIUM IONIZED ARTERIAL (MG/DL): 4.94 mg/dL (ref 4.40–5.40)
CALCIUM IONIZED ARTERIAL (MG/DL): 5.03 mg/dL (ref 4.40–5.40)
CALCIUM IONIZED ARTERIAL (MG/DL): 5.11 mg/dL (ref 4.40–5.40)
CALCIUM IONIZED ARTERIAL (MG/DL): 5.04 mg/dL (ref 4.40–5.40)
CALCIUM IONIZED ARTERIAL (MG/DL): 4.6 mg/dL (ref 4.40–5.40)
CALCIUM IONIZED ARTERIAL (MG/DL): 4.88 mg/dL (ref 4.40–5.40)
CALCIUM IONIZED ARTERIAL (MG/DL): 4.57 mg/dL (ref 4.40–5.40)
CALCIUM IONIZED ARTERIAL (MG/DL): 4 mg/dL — ABNORMAL LOW (ref 4.40–5.40)

## 2022-02-07 LAB — APTT
APTT: 32.7 s (ref 25.1–36.5)
APTT: 31.3 s (ref 25.1–36.5)
HEPARIN CORRELATION: 0.2
HEPARIN CORRELATION: 0.2

## 2022-02-07 LAB — LACTATE, ARTERIAL, WHOLE BLOOD
LACTATE BLOOD ARTERIAL: 1.1 mmol/L (ref <1.3)
LACTATE BLOOD ARTERIAL: 0.8 mmol/L (ref <1.3)
LACTATE BLOOD ARTERIAL: 0.8 mmol/L (ref <1.3)
LACTATE BLOOD ARTERIAL: 0.8 mmol/L (ref <1.3)
LACTATE BLOOD ARTERIAL: 0.6 mmol/L (ref <1.3)
LACTATE BLOOD ARTERIAL: 0.6 mmol/L (ref <1.3)
LACTATE BLOOD ARTERIAL: 0.6 mmol/L (ref <1.3)
LACTATE BLOOD ARTERIAL: 0.4 mmol/L (ref <1.3)

## 2022-02-07 LAB — HEMOGLOBIN, BLOOD GASES
HEMOGLOBIN BLOOD GAS: 10 g/dL — ABNORMAL LOW
HEMOGLOBIN BLOOD GAS: 10 g/dL — ABNORMAL LOW (ref 13.50–17.50)
HEMOGLOBIN BLOOD GAS: 9.4 g/dL — ABNORMAL LOW
HEMOGLOBIN BLOOD GAS: 9.9 g/dL — ABNORMAL LOW (ref 13.50–17.50)
HEMOGLOBIN BLOOD GAS: 9.2 g/dL — ABNORMAL LOW (ref 13.50–17.50)
HEMOGLOBIN BLOOD GAS: 9.3 g/dL — ABNORMAL LOW (ref 13.50–17.50)
HEMOGLOBIN BLOOD GAS: 8.7 g/dL — ABNORMAL LOW (ref 13.50–17.50)
HEMOGLOBIN BLOOD GAS: 7.1 g/dL — ABNORMAL LOW (ref 13.50–17.50)

## 2022-02-07 LAB — HEPATITIS B SURFACE ANTIGEN: HEPATITIS B SURFACE ANTIGEN: NONREACTIVE

## 2022-02-07 LAB — COVID SPIKE IGG
SARS-COV-2 SPIKE AB, INTERP, S: POSITIVE
SARS-COV-2 SPIKE AB, QUANT, S: >250 U/mL

## 2022-02-07 LAB — GAMMA GT
GAMMA GLUTAMYL TRANSFERASE: 69 U/L
GAMMA GLUTAMYL TRANSFERASE: 68 U/L
GAMMA GLUTAMYL TRANSFERASE: 75 U/L — ABNORMAL HIGH

## 2022-02-07 LAB — AMYLASE
AMYLASE: 38 U/L (ref 30–118)
AMYLASE: 32 U/L (ref 30–118)
AMYLASE: 32 U/L (ref 30–118)

## 2022-02-07 LAB — PHOSPHORUS
PHOSPHORUS: 4.6 mg/dL (ref 2.4–5.1)
PHOSPHORUS: 5.4 mg/dL — ABNORMAL HIGH (ref 2.4–5.1)
PHOSPHORUS: 6.3 mg/dL — ABNORMAL HIGH (ref 2.4–5.1)
PHOSPHORUS: 6.5 mg/dL — ABNORMAL HIGH (ref 2.4–5.1)

## 2022-02-07 LAB — TACROLIMUS LEVEL, TROUGH: TACROLIMUS, TROUGH: 2.4 ng/mL — ABNORMAL LOW (ref 5.0–15.0)

## 2022-02-07 LAB — HEPATITIS B SURFACE ANTIBODY
HEPATITIS B SURFACE ANTIBODY QUANT: <8 m[IU]/mL (ref <8.00)
HEPATITIS B SURFACE ANTIBODY: NONREACTIVE

## 2022-02-07 LAB — HEPATITIS C ANTIBODY: HEPATITIS C ANTIBODY: NONREACTIVE

## 2022-02-07 LAB — HEPATITIS B CORE ANTIBODY, TOTAL: HEPATITIS B CORE TOTAL ANTIBODY: NONREACTIVE

## 2022-02-07 LAB — HIV ANTIGEN/ANTIBODY COMBO: HIV ANTIGEN/ANTIBODY COMBO: NONREACTIVE

## 2022-02-07 MED ADMIN — piperacillin-tazobactam (ZOSYN) 3.375 g in sodium chloride 0.9 % (NS) 100 mL IVPB-MBP: 3.375 g | INTRAVENOUS | @ 21:00:00 | Stop: 2022-02-11

## 2022-02-07 MED ADMIN — mycophenolate (CELLCEPT) oral suspension: 1000 mg | GASTROENTERAL | @ 01:00:00

## 2022-02-07 MED ADMIN — albumin human 25 % bottle 25 g: 25 g | INTRAVENOUS | @ 11:00:00

## 2022-02-07 MED ADMIN — valGANciclovir (VALCYTE) oral solution: 450 mg | GASTROENTERAL | @ 12:00:00 | Stop: 2022-02-07

## 2022-02-07 MED ADMIN — nystatin (MYCOSTATIN) oral suspension: 10 mL | ORAL | @ 10:00:00

## 2022-02-07 MED ADMIN — potassium chloride 20 mEq in 100 mL IVPB Premix: 20 meq | INTRAVENOUS | @ 08:00:00 | Stop: 2023-02-06

## 2022-02-07 MED ADMIN — piperacillin-tazobactam (ZOSYN) 3.375 g in sodium chloride 0.9 % (NS) 100 mL IVPB-MBP: 3.375 g | INTRAVENOUS | @ 09:00:00 | Stop: 2022-02-11

## 2022-02-07 MED ADMIN — HYDROmorphone (PF) (DILAUDID) injection 1 mg: 1 mg | INTRAVENOUS | @ 16:00:00 | Stop: 2022-02-21

## 2022-02-07 MED ADMIN — albumin human 25 % bottle 25 g: 25 g | INTRAVENOUS | @ 16:00:00

## 2022-02-07 MED ADMIN — albumin human 25 % bottle 25 g: 25 g | INTRAVENOUS | @ 21:00:00

## 2022-02-07 MED ADMIN — insulin regular (HumuLIN,NovoLIN) injection 0-20 Units: 0-20 [IU] | SUBCUTANEOUS | @ 21:00:00

## 2022-02-07 MED ADMIN — mycophenolate (CELLCEPT) oral suspension: 1000 mg | GASTROENTERAL | @ 12:00:00 | Stop: 2022-02-07

## 2022-02-07 MED ADMIN — famotidine (PEPCID) tablet 20 mg: 20 mg | GASTROENTERAL | @ 01:00:00

## 2022-02-07 MED ADMIN — methylPREDNISolone sodium succinate (PF) (Solu-MEDROL) 500 mg in sodium chloride (NS) 0.9 % 50 mL IVPB: 500 mg | INTRAVENOUS | @ 12:00:00 | Stop: 2022-02-07

## 2022-02-07 MED ADMIN — insulin NPH (HumuLIN,NovoLIN) injection 12 Units: 12 [IU] | SUBCUTANEOUS | @ 14:00:00

## 2022-02-07 MED ADMIN — nystatin (MYCOSTATIN) oral suspension: 10 mL | ORAL | @ 02:00:00

## 2022-02-07 MED ADMIN — albumin human 5 % 5 % bottle 25 g: 25 g | INTRAVENOUS | @ 19:00:00 | Stop: 2022-02-07

## 2022-02-07 MED ADMIN — tacrolimus (PROGRAF) oral suspension: 4 mg | GASTROENTERAL | @ 10:00:00 | Stop: 2022-02-07

## 2022-02-07 MED ADMIN — pantoprazole (PROTONIX) injection 40 mg: 40 mg | INTRAVENOUS | @ 10:00:00

## 2022-02-07 MED ADMIN — tacrolimus (PROGRAF) capsule 4 mg: 4 mg | ORAL | @ 21:00:00

## 2022-02-07 MED ADMIN — magnesium sulfate 2gm/50mL IVPB: 2 g | INTRAVENOUS

## 2022-02-07 MED ADMIN — HYDROmorphone (PF) (DILAUDID) injection 1 mg: 1 mg | INTRAVENOUS | @ 22:00:00 | Stop: 2022-02-21

## 2022-02-07 MED ADMIN — insulin regular (HumuLIN,NovoLIN) injection 0-20 Units: 0-20 [IU] | SUBCUTANEOUS | @ 16:00:00

## 2022-02-07 MED ADMIN — piperacillin-tazobactam (ZOSYN) 3.375 g in sodium chloride 0.9 % (NS) 100 mL IVPB-MBP: 3.375 g | INTRAVENOUS | @ 15:00:00 | Stop: 2022-02-11

## 2022-02-07 MED ADMIN — albumin human 5 % 5 % bottle 25 g: 25 g | INTRAVENOUS | @ 13:00:00 | Stop: 2022-02-07

## 2022-02-07 MED ADMIN — methocarbamoL (ROBAXIN) 500 mg in sodium chloride (NS) 0.9 % 50 mL IVPB: 500 mg | INTRAVENOUS | @ 18:00:00

## 2022-02-07 MED ADMIN — nystatin (MYCOSTATIN) oral suspension: 10 mL | ORAL | @ 18:00:00

## 2022-02-07 MED ADMIN — oxyCODONE (ROXICODONE) immediate release tablet 10 mg: 10 mg | ORAL | @ 12:00:00 | Stop: 2022-02-20

## 2022-02-07 MED ADMIN — HYDROmorphone (PF) injection Syrg 0.5 mg: .5 mg | INTRAVENOUS | @ 15:00:00 | Stop: 2022-02-21

## 2022-02-07 MED ADMIN — melatonin tablet 3 mg: 3 mg | ORAL | @ 21:00:00

## 2022-02-07 MED ADMIN — chlorhexidine (PERIDEX) 0.12 % solution 15 mL: 15 mL | TOPICAL

## 2022-02-07 MED ADMIN — famotidine (PEPCID) tablet 20 mg: 20 mg | GASTROENTERAL | @ 12:00:00 | Stop: 2022-02-07

## 2022-02-07 MED ADMIN — piperacillin-tazobactam (ZOSYN) 3.375 g in sodium chloride 0.9 % (NS) 100 mL IVPB-MBP: 3.375 g | INTRAVENOUS | @ 02:00:00 | Stop: 2022-02-11

## 2022-02-07 NOTE — Unmapped (Signed)
Met with patient after SRF rounds this AM Pt in good spirits, progressing well. Spoke with pt's spouse, Cordelia Pen and she denies any needs at present, is at hospital visiting pt. Enc her to review After Transplant booklet and we'll review next week. Nurse, children's.

## 2022-02-07 NOTE — Unmapped (Signed)
Pt arrived from OR post liver transplant, able to follow commands after sedation weaned per orders. Norepinephrine weaned off with MAPs at goal.  Endotool in place. On PSV for a short while then back on a rate for apnea. UOP adequate, JP drains labled per MD, and emptied x2. Magnesium currently being replaced per lab/PRN order.  Family at bedside for several hours, left to [facility] house, will return in the morning.  Problem: Adult Inpatient Plan of Care  Goal: Absence of Hospital-Acquired Illness or Injury  Intervention: Prevent Skin Injury  Recent Flowsheet Documentation  Taken 02/06/2022 1300 by Maple Mirza, RN  Skin Protection:   adhesive use limited   incontinence pads utilized   mittens applied to hands   pulse oximeter probe site changed   silicone foam dressing in place   skin sealant/moisture barrier applied   skin-to-device areas padded   skin-to-skin areas padded   transparent dressing maintained   tubing/devices free from skin contact  Intervention: Prevent and Manage VTE (Venous Thromboembolism) Risk  Recent Flowsheet Documentation  Taken 02/06/2022 1600 by Maple Mirza, RN  Activity Management: bedrest  Taken 02/06/2022 1400 by Maple Mirza, RN  Activity Management: bedrest  Taken 02/06/2022 1300 by Maple Mirza, RN  Activity Management: bedrest  VTE Prevention/Management: anticoagulant therapy  Range of Motion: Bilateral Upper and Lower Extremities     Problem: Impaired Wound Healing  Goal: Optimal Wound Healing  Intervention: Promote Wound Healing  Recent Flowsheet Documentation  Taken 02/06/2022 1600 by Maple Mirza, RN  Activity Management: bedrest  Taken 02/06/2022 1400 by Maple Mirza, RN  Activity Management: bedrest  Taken 02/06/2022 1300 by Maple Mirza, RN  Activity Management: bedrest  Sleep/Rest Enhancement:   awakenings minimized   regular sleep/rest pattern promoted     Problem: Skin Injury Risk Increased  Goal: Skin Health and Integrity  Intervention: Optimize Skin Protection  Recent Flowsheet Documentation  Taken 02/06/2022 1800 by Maple Mirza, RN  Head of Bed [facility]) Positioning: HOB at 30 degrees  Taken 02/06/2022 1600 by Maple Mirza, RN  Head of Bed [facility]) Positioning: HOB at 30 degrees  Taken 02/06/2022 1400 by Maple Mirza, RN  Head of Bed [facility]) Positioning: HOB at 30 degrees  Taken 02/06/2022 1300 by Maple Mirza, RN  Pressure Reduction Techniques:   frequent weight shift encouraged   heels elevated off bed   positioned off wounds   pressure points protected   weight shift assistance provided  Head of Bed (HOB) Positioning: HOB at 30 degrees  Pressure Reduction Devices:   heel offloading device utilized   positioning supports utilized   pressure-redistributing mattress utilized   specialty bed utilized  Skin Protection:   adhesive use limited   incontinence pads utilized   mittens applied to hands   pulse oximeter probe site changed   silicone foam dressing in place   skin sealant/moisture barrier applied   skin-to-device areas padded   skin-to-skin areas padded   transparent dressing maintained   tubing/devices free from skin contact

## 2022-02-07 NOTE — Unmapped (Signed)
Operative Procedure    Operating Surgeon: Jabria Loos,MD     Assistant Surgeon: Trilby Leaver, Gemma Payor, MD     Pre-op Diagnosis:  Cirrhosis of   Post-op Diagnosis: Same    Biopsy: Post perfusion    PROCEDURE PERFORMED    1. Orthotopic liver transplantation.  2. Choledochocholedochostomy.  3. Liver biopsy    ANESTHESIA: General anesthesia.    IMPORTANT FINDINGS.:    Mild Ascites  Neovascularization from diaphragm  Cirrhotic liver        INDICATIONS FOR THE PROCEDURE: This patient was suffering from cirrhosis and was subsequently discussed in the multidisciplinary meeting and was listed for liver transplantation. Today, a suitable liver graft was available and  deemed suitable to be used for transplantation and we decided to use this allograft.    All the risks and consequences of surgery including but not limited to  chances of severe bleeding, chances of death during the surgery, chances of  massive transfusion, chances of reoperation or re-transplantation either  because of primary non-function or hepatic artery thrombosis, chances of  re-exploration either because of bleeding or clotting of the vessels or any  other visceral injury or intestinal obstruction, etc., chances of  interventional endoscopic procedure for any vascular or other biliary  complication was explained to the patient. Also, all the risks and  consequences of general surgery procedure including, but not limited to,  like the prolonged ICU care because of the lung issues, pneumonia, DVT,  pulmonary embolism, wound infection, etc. was also explained to the  patient. All the risks and consequences of immunosuppression including but  not limited to chances of the PTLD lymphoma, severe infection, was also  explained to the patient. She decided to proceed with the surgery and  consented for the same.      DESCRIPTION OF PROCEDURE:      The patient was taken to the operating room and was kept in the supine  position. Anesthesia team administered the general anesthesia. Adequate  arterial and venous lines were obtained. Parts were prepped and draped in  a standard fashion. Foley catheter was passed with bilateral subcostal  incisions, skin, subcutaneous tissue, anterior rectus sheath, rectus  muscle, posterior rectus sheath was incised. After that, the linea alba  was also incised and the incision was extended in the midline up to the  xiphisternum. Now, we took the round ligament down and the falciform  ligament was divided and the retractor was applied. The left triangular  ligament, gastrohepatic ligament and hepato-renal ligament was incised and  Thompson's  retractor application was completed.    We started dissecting the porta hepatis, the cystic duct was loop ligated  and divided. The peritoneum over the porta hepatis was divided. The left  hepatic artery, right hepatic artery was serially looped, ligated and  divided. The common hepatic duct also was looped, ligated and divided.  After that, we removed all the fibro-adventitial tissue around the portal  vein keeping its length straight. The right triangular ligament was  divided and the liver was lifted up from the retroperitoneal tissue. The  peritoneum over the infrahepatic inferior vena cava was then divided and  the incision was extended onto the left side. Now, the infrahepatic  inferior vena cava was looped with serial ligation on the cephalic side, we  looped, ligated and divided the adrenal vein and after that, we removed all  the fibro-adventitial tissue in the retrocaval area and inferior vena cava  was also looped. The liver  was ready to be explanted.  Anesthesia team was  alerted. The portal vein was clamped , the infrahepatic inferior vena cava  and  suprahepatic inferior vena cava were clamped as well. The patient tolerated  the clamps very well and hence, all these 3 structures were divided and the  liver was explanted.    The new liver was brought out from the ice-box and was brought into the  operating table. The suprahepatic inferior vena caval anastomosis was done  with a 3-0 Prolene in end-to-end fashion. The infrahepatic inferior vena  cava anastomosis was done with a 4-0 Prolene in a continuous fashion. Just  before the completion of this anastomosis, we gave 1.5 liter of chilled  Ringer Lactate solution flush thorugh the portal vein and effluent was allowed to  come out through this infrahepatic inferior vena cava anastomosis and upon  completion of this, the anastomosis was completed.    Now, all the retractors were relieved. The lap pad was kept behind the  liver which was eventually removed and the both porta hepatis were allowed  to come as near as possible. The spoon clamp was applied. The length was  measured to keep it as short and straight as possible and we did the  anastomosis of the portal vein with a 6-0 Prolene suture in a running  fashion. Before doing the anastomosis, we had opened up the clamp on the  recipient portal vein and flushed out around 400 mL of the blood to remove  any clot and there was none and heparinized saline flush was given in this  portal vein as well. Now upon completion of this anastomosis, the  anesthesia team was alerted and we removed the portal clamp, the inferior  hepatic and inferior vena cava clamp and supra-hepatic inferior vena cava  clamp and the patient tolerated the re-perfusion very well and there was no  central re-perfusion syndrome. The remainder of the hemostasis was taken  care of.     We started cleaning the left and right hepatic artery of the  recipient and seeing right gastric artery , it was  looped, ligated and  divided. The bulldog clamp was  applied on the proper hepatic artery and then we divided the artery  exactly at the level of the bifurcation of right and left for doing an anastomosis. On the donor  hepatic artery there was a very good backflow of blood. We infused with  heparinized saline solution and we kept the bulldog clamp and we divided  the donor hepatic artery above the splenic artery and we did the  anastomosis in end-to-end fashion with a 7-0 Prolene suture in a running  fashion. Before doing the anastomosis, we had flushed out the recipient  hepatic artery and the flow was great and there was no evidence of a clot  and a heparinized saline flush was also given.  Now, the liver was ready to be re-perfused with the artery as well and upon  removing the clamp, pinked up very well.     The gallbladder was taken out in a fundus first pattern, the donor bile duct was divided. Bleeding edges were taken care of with the 7-0 Prolene suture after the recipient bile  duct was divided and the bleeding edges were taken care of with the 7-0  Prolene suture as well. Now, we did an end-to-end anastomosis of bile duct  to bile duct with the 6-0 PDS sutures in an interrupted fashion fashion. We did  not use any stent. Before doing the anastomosis, we had sounded both the  bile ducts and the probe was going in very easily. Thus the  choledochocholedochostomy was completed.    We did several rounds of hemostasis. It was looking very satisfactory. We  kept one JP behind the liver and one in the Morrison's pouch and both were  removed from the anterior abdominal wall and secured with a 3-0 nylon  suture. Retractors were removed. The lap counts were done and we made sure after  removing all  retractors that main vessels  were not kinked and we put a  small surgicel ball below the hepatic artery to give a nice and gentle curve  and after that, we re-closed the abdomen in multiple layers with number 1  PDS sutures and the skin was stapled.    The  patient was shifted to ICU after the surgery in stable condition and family was informed.    I was present for the entirety of the procedure(s). No suitable qualified resident was available to help and hence another attending was present during surgery.Jamse Mead, MD

## 2022-02-07 NOTE — Unmapped (Signed)
Patient weaned to PSV 10/8 35%. No visible skin breakdown around patient's airways noted this time. Passed SBT. Emergency equipment at the bedside.

## 2022-02-07 NOTE — Unmapped (Signed)
Adult Transplant Nutrition Assessment Note    Visit Type: MD Consult  Reason for Visit: Transplant    HPI & PMH:  CVA, HLD, OSA on CPAP, T2DM, HTN, cryptogenic cirrhosis     Nutrition History/Progress:   Patient reports eating well at home prior to transplant. He reports his blood glucose levels were pretty stable and well controlled as well. He reports feeling hungry as he has not had anything to eat since Monday night.     Anthropometric Data:  -- Height: 172.7 cm (5' 7.99)   -- Last recorded weight: 98.2 kg (216 lb 7.9 oz)  -- Admission weight: 94.4 kg (208 lb 1.8 oz)  -- IBW: 69.9 kg  -- Percent IBW: 135.01 %  -- BMI: Body mass index is 32.93 kg/m??.   -- Weight changes this admission:   Last 5 Recorded Weights    02/05/22 1840 02/07/22 0543   Weight: 94.4 kg (208 lb 1.8 oz) 98.2 kg (216 lb 7.9 oz)      -- Weight history PTA:   Wt Readings from Last 10 Encounters:   02/07/22 98.2 kg (216 lb 7.9 oz)   01/29/22 96.9 kg (213 lb 11.2 oz)   01/14/22 89.8 kg (198 lb)   01/03/22 88.9 kg (196 lb)   12/31/21 90.4 kg (199 lb 6.4 oz)   12/11/21 90 kg (198 lb 7.1 oz)   12/07/21 89.8 kg (198 lb)   11/26/21 92.1 kg (203 lb)   10/23/21 100.7 kg (222 lb 0.1 oz)   10/08/21 90 kg (198 lb 8 oz)        Nutrition Focused Physical Exam:  Unable to complete at this time due to patient's clinical condition     NUTRITIONALLY RELEVANT DATA   Medications:   Nutritionally pertinent medications reviewed and evaluated for potential food and/or medication interactions.     Labs:   Nutritionally pertinent labs reviewed and include Phosphorus: 5.4 mg/dL  Lab Results   Component Value Date    Hemoglobin A1C 5.2 02/05/2022    Hemoglobin A1C 4.7 (L) 01/14/2022    Hemoglobin A1C 4.6 (L) 12/11/2021     No results found for: VITAMINA  Lab Results   Component Value Date    Vitamin D Total (25OH) 27.9 09/11/2020    Vitamin D Total (25OH) 13.9 (L) 05/25/2019     No results found for: VITAME  Lab Results   Component Value Date    Prothrombin Time 16.6 (H) 02/04/2022    Prothrombin Time 14.1 (H) 01/21/2022    Prothrombin Time 15.3 (H) 01/08/2022     Lab Results   Component Value Date    PT 14.0 (H) 02/07/2022    PT 15.3 (H) 02/07/2022    PT 16.0 (H) 02/06/2022     No results found for: CRP  No results found for: ZINC  No results found for: COPPER  No results found for: VTB1    Intake/Output:  Net Intake/Output: +6.85 L   Last Bowel Movement: prior to admission     Allergies, Intolerances, Sensitivities, and/or Cultural/Religious Dietary Restrictions: none identified per chart review at this time     Skin: As below  Patient Lines/Drains/Airways Status       Active Wounds       Name Placement date Placement time Site Days    Surgical Site 02/06/22 Abdomen 02/06/22  1223  -- less than 1  Current Nutrition:  NPO    Nutrition Orders            NPO Sips with meds; Operating room: NPO starting at 06/28 1509             Nutrient Needs:   Daily Estimated Nutrient Needs:  Energy: 2100- 2450 kcals [30- 35 kcal/kg using ideal body weight, 70 kg]  Protein: 105- 140 gm [1.5- 2.0 gm/kg using ideal body weight, 70 kg]  Fluid:   [per MD team]  Sodium:  <2000 mg     Malnutrition assessment not yet completed at this time due to inability to complete nutrition focused physical exam (NFPE).    GOALS and EVALUATION   Meet estimated nutritional needs New/Progressing  Prevent/improve malnutrition New/Progressing  Basic understanding of educational needs by discharge New    Motivation, Barriers, and Compliance:  Evaluation of motivation, barriers, and compliance pending at this time due to clinical status.     NUTRITION ASSESSMENT   Patient is post liver transplant. Patient denies nausea today and per RN he has been taking frequent sips of water. His NG tube was removed this morning and he reports feeling better than expected post transplant. Patient is amenable to restart Ensure supplementation once his diet is advanced.     Current  nutrition therapy is appropriate although not meeting nutritional needs at this time due to NPO status.     Discharge Planning:   The following discharge needs have been identified: education    NUTRITION INTERVENTIONS and RECOMMENDATION   Monitor nothing by mouth status, diet tolerance and advancement  Once diet is advanced, recommend Ensure Plus High Protein 4-5x per day   Weigh patient 2x weekly this admission  Education prior to discharge      Follow-Up Parameters:   1-2 times per week (and more frequent as indicated)    Patient discussed with multidisciplinary team.      Lanelle Bal, RD, LDN, CCTD  Abdominal Transplant Dietitian   Pager: [phone]

## 2022-02-07 NOTE — Unmapped (Signed)
Social Work  Psychosocial Assessment    Patient Name: Matthew Key   Medical Record Number: [id number]   Date of Birth: 05-22-1961  Sex: Male     Referral  Referred by: Care Manager  Reason for Referral: Complex Discharge Planning  No Psychosocial Interventions Necessary: No Psychosocial Interventions Necessary    Extended Emergency Contact Information  Primary Emergency Contact: Faux,Sherry  Mobile Phone: [phone]  Relation: Spouse  Secondary Emergency Contact: Doughty,Omero [place]  Mobile Phone: [phone]  Relation: Son    Legal Next of Kin / Guardian / POA / Advance Directives    HCDM (patient stated preference): Twiggs,Sherry - Spouse - [phone]    HCDM, back-up (If primary HCDM is unavailable): Irbin, Fines - Son [phone]    Advance Directive (Medical Treatment)  Does patient have an advance directive covering medical treatment?: Patient does not have advance directive covering medical treatment.  Reason patient does not have an advance directive covering medical treatment:: Patient does not wish to complete one at this time.    Health Care Decision Maker [HCDM] (Medical & Mental Health Treatment)  Healthcare Decision Maker: Patient does not wish to appoint a Health Care Decision Maker at this time  Information offered on HCDM, Medical & Mental Health advance directives:: Patient declined information.    Advance Directive (Mental Health Treatment)  Does patient have an advance directive covering mental health treatment?: Patient does not have advance directive covering mental health treatment.  Reason patient does not have an advance directive covering mental health treatment:: Patient does not wish to complete one at this time.    Discharge Planning  Discharge Planning Information:   Type of Residence   Mailing Address:  [address]  [place] [address]    Medical Information   Past Medical History:   Diagnosis Date    AKI (acute kidney injury) (CMS-HCC) 12/14/2020 Anxiety 10/22/2013    Arthritis     Cervical radiculopathy 12/03/2016    Chronic pain disorder     Lower back    Cirrhosis (CMS-HCC)     Dental abscess 10/2020    Duodenal ulcer 12/01/2017    GERD (gastroesophageal reflux disease)     History of transfusion     Hyperlipidemia 10/22/2013    Hypertension     under control with meds and weight loss    Hypotension 01/29/2022    Liver disease     Sleep apnea, obstructive     Have machine    Stroke (CMS-HCC)     mild stroke    Type 2 diabetes mellitus with diabetic neuropathy, with long-term current use of insulin (CMS-HCC) 06/09/2014       Past Surgical History:   Procedure Laterality Date    KNEE SURGERY      PR CATH PLACE/CORON ANGIO, IMG SUPER/INTERP,R&L HRT CATH, L HRT VENTRIC N/A 12/19/2020    Procedure: CATH LEFT/RIGHT HEART CATHETERIZATION;  Surgeon: Rosana Hoes, MD;  Location: [facility] CATH;  Service: Cardiology    PR COLONOSCOPY FLX DX W/COLLJ SPEC WHEN PFRMD N/A 12/07/2021    Procedure: COLONOSCOPY, FLEXIBLE, PROXIMAL TO SPLENIC FLEXURE; DIAGNOSTIC, W/WO COLLECTION SPECIMEN BY BRUSH OR WASH;  Surgeon: Annie Paras, MD;  Location: GI [facility]URES [facility];  Service: Gastroenterology    PR TRANSPLANT LIVER,ALLOTRANSPLANT N/A 02/06/2022    Procedure: LIVER ALLOTRANSPLANTATION; ORTHOTOPIC, PARTIAL OR WHOLE, FROM CADAVER OR LIVING DONOR, ANY AGE;  Surgeon: Florene Glen, MD;  Location: MAIN OR [facility];  Service: Transplant  PR TRANSPLANT,PREP DONOR LIVER/ARTERIAL N/A 02/06/2022    Procedure: BACKBNCH RECONSTRUCT OF CAD/LIVE DONOR LIVER GFT PRIOR TRANSPLANT; ARTERIAL ANASTAMOSIS, EA;  Surgeon: Florene Glen, MD;  Location: MAIN OR [place];  Service: Transplant    PR UPPER GI ENDOSCOPY,BIOPSY N/A 10/12/2020    Procedure: UGI ENDOSCOPY; WITH BIOPSY, SINGLE OR MULTIPLE;  Surgeon: Marene Lenz, MD;  Location: [facility];  Service: Gastroenterology    PR UPPER GI ENDOSCOPY,DIAGNOSIS N/A 01/03/2022    Procedure: UGI ENDO, INCLUDE ESOPHAGUS, STOMACH, & DUODENUM &/OR JEJUNUM; DX W/WO COLLECTION SPECIMN, BY BRUSH OR WASH;  Surgeon: Marene Lenz, MD;  Location: [facility];  Service: Gastroenterology    PR UPPER GI ENDOSCOPY,LIGAT VARIX N/A 12/07/2021    Procedure: UGI ENDO; Everlene Balls LIG ESOPH &/OR GASTRIC VARICES;  Surgeon: Annie Paras, MD;  Location: GI [facility];  Service: Gastroenterology    ROOT CANAL      Front teeth       Family History   Problem Relation Age of Onset    Edema Mother     Alzheimer's disease Father     Aortic dissection Brother     Early death Brother     Aneurysm Brother        Financial Information   Primary Insurance: Payor: HU[employer] MEDICARE ADV / Plan: HU[employer]NA GOLD PLUS HMO / Product Type: *No Product type* /    Secondary Insurance: None   Prescription Coverage: Nurse, learning disability (listed above)   Preferred Pharmacy: [id number] - [place], [place] - [address]  [place] [facility] ([facility]) - [place], [place] - [address]  [facility]    Barriers to taking medication: TBD    Transition Home   Transportation at time of discharge: Family/Friend's Private Vehicle    Anticipated changes related to Illness:  s/p liver transplant patient   Services in place prior to admission: set up with [facility] [place]) at last discharge 01/31/22   Services anticipated for DC:  TBD but likely will need HH resumed    Hemodialysis Prior to Admission: No    Readmission  Risk of Unplanned Readmission Score: UNPLANNED READMISSION SCORE: 54.17%  Readmitted Within the Last 30 Days? Yes  Readmission Factors include: other: s/p liver transplant     Social Determinants of Health  Social Determinants of Health were addressed in provider documentation.  Please refer to patient history.    Social History  Support Systems/Concerns: Family Members, Spouse, Presenter, broadcasting Service: Cytogeneticist (Retired, Interior and spatial designer, Catering manager.)   Veteran: NOT registered at a [employer]: denies    Medical and Psychiatric History  Psychosocial Stressors: New life altering diagnosis, Coping with health challenges/recent hospitalization, Financial concerns, Family issues / concerns (wife has cancer)  Psychological Issues/Information: Comment (hx of anxiety and depression)  Chemical Dependency: None  Outpatient Providers: Specialist   Name / Contact #: : [facility]  Legal: No legal issues  Ability to [employer]: Lack of transportation

## 2022-02-07 NOTE — Unmapped (Signed)
Neuro: Follows commands in all 4 extremities. +cough/gag/corneal. PERRL. Propofol and fentanyl titrated to maintain adequate sedation and pain control.   CV: NSR with occasional PVCs. Pulses palpable. Afebrile. Normotensive.   Resp: ETT in place. On PRVC. See flowsheets for settings. Passed SBT.   GI: No bm this shift. NGT in place and hooked up to LIWS. Insulin gtt titrated per endotool.   GU: Foley in place. Adequate urine output.   Skin: Pt repositioned q2 hours. 2 JP drains in place. See flowsheets for wounds.   Plan: Pt remains ICU status in the SICU.   Please see flowsheets for further information.     Problem: Adult Inpatient Plan of Care  Goal: Plan of Care Review  Outcome: Progressing  Goal: Patient-Specific Goal (Individualized)  Outcome: Progressing  Goal: Absence of Hospital-Acquired Illness or Injury  Outcome: Progressing  Intervention: Identify and Manage Fall Risk  Recent Flowsheet Documentation  Taken 02/06/2022 2000 by Graciella Belton, RN  Safety Interventions:   aspiration precautions   fall reduction program maintained   lighting adjusted for tasks/safety   low bed  Intervention: Prevent Skin Injury  Recent Flowsheet Documentation  Taken 02/06/2022 2000 by Graciella Belton, RN  Skin Protection: adhesive use limited  Intervention: Prevent and Manage VTE (Venous Thromboembolism) Risk  Recent Flowsheet Documentation  Taken 02/07/2022 0600 by Graciella Belton, RN  Activity Management: bedrest  Taken 02/07/2022 0400 by Graciella Belton, RN  Activity Management: bedrest  Taken 02/07/2022 0200 by Graciella Belton, RN  Activity Management: bedrest  Taken 02/07/2022 0000 by Graciella Belton, RN  Activity Management: bedrest  Taken 02/06/2022 2200 by Graciella Belton, RN  Activity Management: bedrest  Taken 02/06/2022 2000 by Graciella Belton, RN  Activity Management: bedrest  VTE Prevention/Management: anticoagulant therapy  Intervention: Prevent Infection  Recent Flowsheet Documentation  Taken 02/06/2022 2000 by Graciella Belton, RN  Infection Prevention: cohorting utilized  Goal: Optimal Comfort and Wellbeing  Outcome: Progressing  Goal: Readiness for Transition of Care  Outcome: Progressing  Goal: Rounds/Family Conference  Outcome: Progressing     Problem: Infection  Goal: Absence of Infection Signs and Symptoms  Outcome: Progressing  Intervention: Prevent or Manage Infection  Recent Flowsheet Documentation  Taken 02/06/2022 2000 by Graciella Belton, RN  Infection Management: aseptic technique maintained     Problem: Impaired Wound Healing  Goal: Optimal Wound Healing  Outcome: Progressing  Intervention: Promote Wound Healing  Recent Flowsheet Documentation  Taken 02/07/2022 0600 by Graciella Belton, RN  Activity Management: bedrest  Taken 02/07/2022 0400 by Graciella Belton, RN  Activity Management: bedrest  Taken 02/07/2022 0200 by Graciella Belton, RN  Activity Management: bedrest  Taken 02/07/2022 0000 by Graciella Belton, RN  Activity Management: bedrest  Taken 02/06/2022 2200 by Graciella Belton, RN  Activity Management: bedrest  Taken 02/06/2022 2000 by Graciella Belton, RN  Activity Management: bedrest     Problem: Skin Injury Risk Increased  Goal: Skin Health and Integrity  Outcome: Progressing  Intervention: Optimize Skin Protection  Recent Flowsheet Documentation  Taken 02/07/2022 0600 by Graciella Belton, RN  Head of Bed [facility]) Positioning: HOB at 30 degrees  Taken 02/07/2022 0400 by Graciella Belton, RN  Head of Bed [facility]) Positioning: HOB at 30 degrees  Taken 02/07/2022 0200 by Graciella Belton, RN  Head of Bed [facility]) Positioning: HOB at 30 degrees  Taken 02/07/2022 0000 by Graciella Belton, RN  Head of Bed [facility])  Positioning: HOB at 30 degrees  Taken 02/06/2022 2200 by Graciella Belton, RN  Head of Bed [facility]) Positioning: HOB at 30 degrees  Taken 02/06/2022 2000 by Graciella Belton, RN  Pressure Reduction Techniques: weight shift assistance provided  Head of Bed (HOB) Positioning: HOB at 30 degrees  Pressure Reduction Devices: positioning supports utilized  Skin Protection: adhesive use limited     Problem: Self-Care Deficit  Goal: Improved Ability to Complete Activities of Daily Living  Outcome: Progressing     Problem: Fall Injury Risk  Goal: Absence of Fall and Fall-Related Injury  Outcome: Progressing  Intervention: Promote Injury-Free Environment  Recent Flowsheet Documentation  Taken 02/06/2022 2000 by Graciella Belton, RN  Safety Interventions:   aspiration precautions   fall reduction program maintained   lighting adjusted for tasks/safety   low bed

## 2022-02-07 NOTE — Unmapped (Signed)
Tacrolimus Therapeutic Monitoring Pharmacy Note    Matthew Key is a 61 y.o. male starting tacrolimus.     Indication: Liver transplant     Date of Transplant: 02/06/22      Prior Dosing Information: None/new initiation     Goals:  Therapeutic Drug Levels  Tacrolimus trough goal: 8-10 ng/mL    Additional Clinical Monitoring/Outcomes  ?? Monitor renal function (SCr and urine output) and liver function (LFTs)  ?? Monitor for signs/symptoms of adverse events (e.g., hyperglycemia, hyperkalemia, hypomagnesemia, hypertension, headache, tremor)    Results:   Tacrolimus level: Not applicable    Pharmacokinetic Considerations and Significant Drug Interactions:   ??? Concurrent hepatotoxic medications: None identified  ??? Concurrent CYP3A4 substrates/inhibitors: None identified  ??? Concurrent nephrotoxic medications: None identified    Assessment/Plan:  Recommendedation(s)  ??? Start tacrolimus 4 mg BID    Follow-up  ??? Daily tac levels .   ??? A pharmacist will continue to monitor and recommend levels as appropriate    Please page service pharmacist with questions/clarifications.    Wilfrid Lund, PharmD   Critical Care Clinical Pharmacy Specialist

## 2022-02-07 NOTE — Unmapped (Signed)
Surgical ICU Progress Note    Consulting attending: Dr. 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     Plan: Transfer to the SICU under Trauma Surgery/Critical Care Service for critical care management.    Neuro: Awake, oriented, appropriate. Pain well-controlled on current regimen.  - Fentanyl gtt for analgesia (75) dc  - Propofol gtt for sedation (10)  - Tylenol 650 q6h PRN  - Oxy 10mg  q4h PRN   - Robaxin, dilaudid    CV: HDS off pressors  - 500 albumin bolus, responded well  - Lactate cleared at 0.8    Pulm: Mechanically ventilated on Fi02 35. Passed SBT plan to extubate. IS,   - q2h ABG, pH 7.37, pC02 39.8, p02 118, HC03 23, BE -2.5  - CXR this AM was unremarkable     FEN/GI:  F: IVF with NS @ 161mL/hr  E: replete electrolytes PRN, WNL Na 140, K 3.8, Mg 2.3, Phos 4.6  N: NPO except sips with meds, NGT removed   -PPI, peridex-> dc    * s/p DDLT  - 2 drains:   - Lateral (hepatic dome) (230 12hr)   - Medial (porta hepatis) (10 12hr)  - Immunosuppression and anti-rejection meds per SRF    Renal/GU: UOP 2.1L, Net +4.8L since yesterday, Creatinine 1.63 (baseline 1.4-1.5)  - Foley for accurate I/Os in critically ill patient  - q6h BMP    Heme: H/H stable. 9.8  - q6h CBC    ID: Afebrile.     Endo: Hyperglycemia, 140s endotool 2-2.6/hr  - Endotool discontinued placed on 12 NPH BID with correctional    MSK: NAI  PT/OT    Wound: Mercedes incision    Lines, tubes, drains: ETT, arterial line (left radial), central venous line (RIJ), Foley, 2 PIV, NGT    Daily Care Checklish:            Stress Ulcer Prevention:           DVT Prophylaxis:  Holding post transplant            HOB > 30 degrees: yes             Daily Awakening:  Yes           Spontaneous Breathing Trial: yes           Continued Beta Blockade:  no           Continued need for central/PICC line : yes  hemodynamic monitoring and critically ill requiring fluid resuscitation           Continue urinary catheter for: yes  strict intake and output    Dispo: Admit to SICU.     History of Present Illness:     Chief Complaint: Cryptogenic Cirrhosis     Matthew Key is a 61 y.o. 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 The patient was taken to the OR on 6/28. Intraoperatively, he required the following resuscitation products: 4u pRBC, 6u FFP, 1u cryoprecipitate, 1u platelets, 500cc albumin, 5L crystalloid. Upon admission to the SICU, the patient was intubated/sedated, on 17mcg/min of norepinephrine,  and had an arterial lactate of 5.1.      Allergies:  Venom-honey bee    Home Medications:   Medications Prior to Admission   Medication Sig Dispense Refill Last Dose    acetaminophen (TYLENOL) 325 MG tablet Take 2 tablets (650 mg total) by mouth every six (6) hours as needed.  0 02/05/2022    atorvastatin (LIPITOR) 10 MG tablet Take 1 tablet (10 mg total) by mouth nightly.   02/05/2022    ciprofloxacin HCl (CIPRO) 500 MG tablet TAKE 1 TABLET BY MOUTH IN THE MORNING 30 tablet 0 02/05/2022    furosemide (LASIX) 20 MG tablet Take 2 tablets (40 mg total) by mouth daily. Restart on 6/23 60 tablet 0 02/05/2022    gabapentin (NEURONTIN) 300 MG capsule Take 1 capsule (300 mg total) by mouth at bedtime. 30 capsule 5 02/05/2022    hydrOXYzine (VISTARIL) 50 MG capsule Take 1 capsule (50 mg total) by mouth nightly as needed.   Past Week    insulin syringe-needle U-100 1 mL 31 gauge x 5/16 (8 mm) Syrg Use once as needed for Hydrocortisone injection.. 10 each 0 Past Week    lactulose 10 gram/15 mL solution Take 30 mL (20 g total) by mouth Three (3) times a day. 2700 mL 10 02/05/2022    ondansetron (ZOFRAN-ODT) 4 MG disintegrating tablet TAKE 1 TABLET BY MOUTH EVERY 6 HOURS FOR 7 DAYS   Past Month    ondansetron (ZOFRAN-ODT) 4 MG disintegrating tablet Take 1 tablet (4 mg total) by mouth every eight (8) hours as needed for nausea. 72 tablet 11 Past Month    pantoprazole (PROTONIX) 40 MG tablet Take 1 tablet (40 mg total) by mouth daily. 30 tablet 2 02/05/2022    rifAXIMin (XIFAXAN) 550 mg Tab Take 1 tablet (550 mg total) by mouth Two (2) times a day. 60 tablet 5 Past Week    sodium chloride (AYR) 0.65 % Drop 1 spray.   Past Month    spironolactone (ALDACTONE) 50 MG tablet Take 1 tablet (50 mg total) by mouth daily. Restart 6/23 30 tablet 0 02/05/2022    syringe with needle 3 mL 23 x 1 Syrg Use as needed for Hydrocortisone injection 1 each 0 Past Week    zinc sulfate 110 mg (25 mg elemental zinc) Tab Take 1 tablet (110 mg total) by mouth in the morning. 30 tablet 11 02/05/2022    pen needle, diabetic (BD ULTRA-FINE NANO PEN NEEDLE) 32 gauge x 5/32 (4 mm) Ndle ok to sub any brand or size needle preferred by insurance/patient, use 1-2x/day, dx E11.65 100 each 12     [EXPIRED] TRESIBA FLEXTOUCH U-100 100 unit/mL (3 mL) InPn Inject 0.3 mL (30 Units total) under the skin at bedtime. Adjust as instructed. 15 mL 0        Medical History:  Past Medical History:   Diagnosis Date    AKI (acute kidney injury) (CMS-HCC) 12/14/2020    Anxiety 10/22/2013    Arthritis     Cervical radiculopathy 12/03/2016    Chronic pain disorder     Lower back    Cirrhosis (CMS-HCC)     Dental abscess 10/2020    Duodenal ulcer 12/01/2017    GERD (gastroesophageal reflux disease)     History of transfusion     Hyperlipidemia 10/22/2013    Hypertension     under control with meds and weight loss    Hypotension 01/29/2022    Liver disease     Sleep apnea,  obstructive     Have machine    Stroke (CMS-HCC)     mild stroke    Type 2 diabetes mellitus with diabetic neuropathy, with long-term current use of insulin (CMS-HCC) 06/09/2014       Surgical History:  Past Surgical History:   Procedure Laterality Date    KNEE SURGERY      PR CATH PLACE/CORON ANGIO, IMG SUPER/INTERP,R&L HRT CATH, L HRT VENTRIC N/A 12/19/2020    Procedure: CATH LEFT/RIGHT HEART CATHETERIZATION;  Surgeon: Rosana Hoes, MD;  Location: [facility] CATH;  Service: Cardiology    PR COLONOSCOPY FLX DX W/COLLJ SPEC WHEN PFRMD N/A 12/07/2021    Procedure: COLONOSCOPY, FLEXIBLE, PROXIMAL TO SPLENIC FLEXURE; DIAGNOSTIC, W/WO COLLECTION SPECIMEN BY BRUSH OR WASH;  Surgeon: Annie Paras, MD;  [facility];  Service: Gastroenterology    PR UPPER GI ENDOSCOPY,BIOPSY N/A 10/12/2020    Procedure: UGI ENDOSCOPY; WITH BIOPSY, SINGLE OR MULTIPLE;  Surgeon: Marene Lenz, MD;  [facility]: [facility];  Service: Gastroenterology    PR UPPER GI ENDOSCOPY,DIAGNOSIS N/A 01/03/2022    Procedure: UGI ENDO, INCLUDE ESOPHAGUS, STOMACH, & DUODENUM &/OR JEJUNUM; DX W/WO COLLECTION SPECIMN, BY BRUSH OR WASH;  Surgeon: Marene Lenz, MD;  [facility]: [facility];  Service: Gastroenterology    PR UPPER GI ENDOSCOPY,LIGAT VARIX N/A 12/07/2021    Procedure: UGI ENDO; Everlene Balls LIG ESOPH &/OR GASTRIC VARICES;  Surgeon: Annie Paras, MD;  [facility]: [facility];  Service: Gastroenterology    ROOT CANAL      Front teeth       Social History:  Tobacco use:   reports that he has never smoked. He has never used smokeless tobacco.  Alcohol use:   reports that he does not currently use alcohol.  Drug use:  reports that he does not currently use drugs.    Family History:     Family History   Problem Relation Age of Onset    Edema Mother      Alzheimer's disease Father      Aortic dissection Brother      Early death Brother      Aneurysm Brother        Review of Systems:  Review of systems was unobtainable due to patient factors.    Objective  Vitals Reviewed:    Temp:  [36.7 ??C (98 ??F)-37.1 ??C (98.8 ??F)] 37.1 ??C (98.8 ??F)  Heart Rate:  [61-86] 86  SpO2 Pulse:  [61-86] 86  Resp:  [13-20] 18  A BP-1: (91-143)/(44-70) 143/70  MAP:  [59 mmHg-101 mmHg] 101 mmHg  FiO2 (%):  [35 %-60 %] 35 %  SpO2:  [96 %-98 %] 98 %   Temp (24hrs), Avg:36.9 ??C (98.4 ??F), Min:36.7 ??C (98 ??F), Max:37.1 ??C (98.8 ??F)     SpO2: 98 %   Height: 172.7 cm (5' 7.99)    Weight: 98.2 kg (216 lb 7.9 oz)    Body mass index is 32.93 kg/m??.    Body surface area is 2.17 meters squared.     Intake/Output Summary (Last 24 hours) at 02/07/2022 0623  Last data filed at 02/07/2022 0600  Gross per 24 hour   Intake 8686.91 ml   Output 3910 ml   Net 4776.91 ml        I/O last 3 completed shifts:  In: 8156.1 [I.V.:5161.1; Blood:2400; NG/GT:30; IV Piggyback:565]  Out: 2915 [Urine:1475; Drains:190; Blood:1250]   I/O this shift:  In: 1830.8 [I.V.:1370.8; NG/GT:60;  IV Piggyback:400]  Out: 995 [Urine:715; Emesis/NG output:40; Drains:240]    Continuous Infusions:    fentaNYL citrate (PF) 50 mcg/mL infusion 75 mcg/hr (02/07/22 0600)    insulin regular 2 Units/hr (02/07/22 0600)    lactated Ringers      norepinephrine bitartrate-NS      propofol 10 mg/mL infusion 10 mcg/kg/min (02/07/22 0606)    sodium chloride 100 mL/hr (02/07/22 0600)    sodium chloride       Tubes and Drains:  Urethral Catheter Non-latex 16 Fr. (Active)   Number of days: 0     Introducer 02/06/22 Internal jugular Right (Active)   Number of days: 0        ETT  8 (Active)   Secured at 25 cm 02/05/22 0006   Placement Assessment Equal Bilateral Breath Sounds;Positive ETCO2 02/05/22 0006   Number of days: 0          Physical Exam:    GEN: Alert awake and oriented  CV: regular rate and rhythm  RESP: CTAB  ABD: Soft, non-distended; dressings over Mercedes incision; 2 right-sided abdominal drains with sanguineous drainage  GU: Foley in place  EXT: Warm, well-perfused, no edema    Labs/Studies: Reviewed in EPIC.    Adline Potter, PGY1

## 2022-02-07 NOTE — Unmapped (Signed)
Transplant Event  Conversation with: Edyth Gunnels RN    Primary Insurance Co: [employer]  Secondary Insurance Co: N/A    [place] Event Authorization No (Primary): [id number]  [place] Event Authorization No (Secondary): N/A    Donor Benefits Included and Verified: YES    [place] Event Authorization Primary Phone Number: [phone]  [place] Event Authorization Secondary Phone Number: N/A    Liver transplant admission approved on 06/27, transplant date 06/28.

## 2022-02-07 NOTE — Unmapped (Signed)
Transplant Surgery Progress Note    Date of service: 02/07/2022    Hospital Day:  LOS: 1 day   Surgery Date(s): 02/06/22  Consultation at the request of Surgical Attending Chirag Sureshchandra Des* for the evaluation and management of OLT.  ICU Attending: Dr. Leonette Most    Interval History:   NAEON. Afebrile and hemodynamically stable. No pressors or boluses overnight. Passed SBT this AM; following commands. Appropriate UOP.     Assessment/Plan:  Matthew Key is a 61 y.o. 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     Neurological:    *Pain and sedation  - MMPC now patient is extubated    Cardiovascular:   *MAP goal >65  - Off of pressors  - Would favor albumin bolusing to support MAPs over pressors    Pulmonary:   *Extubated to [place] on 02/07/22 after passed SBT  - q2 ABGs    Renal/Genitourinary:  *Strict I&Os via foley catheter  - Trend BUN/Cr  - Plan to TOV on 02/08/22    GI/Nutrition:  F: 100cc/hr NS  E: Replete PRN  N: DC NGT and okay for sips    *S/p OLT on 02/06/22  - q6 labs  - Liver transplant doppler: WNL  - Surgical drains to bulb suction x2  - 25% albumin TID    Heme:   DVT ppx: holding; pending further discussions with SRF team  - q6 CBC    Induction immunosuppression: methylpred and basiliximab  Maintenance immunosuppression: MMF and tac    ID:  - Zosyn    LDA: Switch RIJ to triple lumen CVC.    Appreciate co-management by SICU team.    Dispo: ICU    Objective:    Physical Exam:    General: NAD  HEENT: NGT in place to LIWS  CV: Regular rate per telemetry  Resp: Symmetric chest rise on nasal cannula  Abdomen: Soft, mildly distended, surgical drains w/ serosanguionous output x2. Mercedes incision with appropriate strikethrough  Extremities: WWP    Data Review:   I have reviewed the labs and studies from the last 24 hours.    Vitals Reviewed:    Temp:  [36.7 ??C (98 ??F)-37.1 ??C (98.8 ??F)] 37 ??C (98.6 ??F)  Heart Rate:  [61-95] 95  SpO2 Pulse:  [61-95] 95  Resp:  [11-20] 18  A BP-1: (91-177)/(44-80) 177/80  MAP:  [59 mmHg-119 mmHg] 119 mmHg  FiO2 (%):  [35 %-60 %] 35 %  SpO2:  [96 %-100 %] 98 %   Temp (24hrs), Avg:36.9 ??C (98.4 ??F), Min:36.7 ??C (98 ??F), Max:37.1 ??C (98.8 ??F)     SpO2: 98 %   Height: 172.7 cm (5' 7.99)    Weight: 98.2 kg (216 lb 7.9 oz)    Body mass index is 32.93 kg/m??.    Body surface area is 2.17 meters squared.       Intake/Output Summary (Last 24 hours) at 02/07/2022 1042  Last data filed at 02/07/2022 1000  Gross per 24 hour   Intake 4681.93 ml   Output 3315 ml   Net 1366.93 ml        I/O last 3 completed shifts:  In: 9986.9 [I.V.:6531.9; Blood:2400; NG/GT:90; IV Piggyback:965]  Out: 3910 [Urine:2190; Emesis/NG output:40; Drains:430; Blood:1250]   I/O this shift:  In: 1060 [P.O.:30; I.V.:220; NG/GT:60; IV Piggyback:750]  Out: 205 [Urine:75; Drains:130]  Continuous Infusions:   ??? insulin regular 2 Units/hr (02/07/22 1005)   ??? lactated Ringers     ??? sodium chloride           Hemodynamic/Invasive Device Data (24 hrs):  A BP-1: (91-177)/(44-80) 177/80  MAP:  [59 mmHg-119 mmHg] 119 mmHg            Ventilation/Oxygen Therapy (24hrs):  Vent Mode: PRVC  S RR:  [15] 15  FiO2 (%):  [35 %-60 %] 35 %  S VT:  [450 mL] 450 mL  PC Set:  [15] 15  PR SUP:  [5 cm H20-10 cm H20] 10 cm H20  O2 Device: Nasal cannula  O2 Flow Rate (L/min):  [2 L/min-4 L/min] 2 L/min    Tubes and Drains:  Patient Lines/Drains/Airways Status     Active Active Lines, Drains, & Airways     Name Placement date Placement time Site Days    ETT  8 02/06/22  0345  -- 1    Introducer 02/06/22 Internal jugular Right 02/06/22  0410  Internal jugular  1    CVC MAC Introducer 02/06/22 Right Internal jugular 02/06/22  1000  Internal jugular  1    Closed/Suction Drain 1 Anterior RLQ Bulb 10 Fr. 02/06/22  1052  RLQ  less than 1    Closed/Suction Drain 2 Anterior;Lateral RLQ Bulb 10 Fr. 02/06/22  1150  RLQ  less than 1 Urethral Catheter Non-latex 16 Fr. 02/06/22  0400  Non-latex  1    Peripheral IV 02/05/22 Anterior;Left Forearm 02/05/22  1726  Forearm  1    Peripheral IV 02/06/22 Right Hand 02/06/22  0424  Hand  1    Arterial Line 02/06/22 Left Radial 02/06/22  0426  Radial  1                Idalia Needle, MD  PGY-3 General Surgery

## 2022-02-08 LAB — BLOOD GAS CRITICAL CARE PANEL, ARTERIAL
BASE EXCESS ARTERIAL: -8.3 — ABNORMAL LOW (ref -2.0–2.0)
BASE EXCESS ARTERIAL: -6.3 — ABNORMAL LOW (ref -2.0–2.0)
BASE EXCESS ARTERIAL: -7.9 — ABNORMAL LOW (ref -2.0–2.0)
CALCIUM IONIZED ARTERIAL (MG/DL): 4.37 mg/dL — ABNORMAL LOW (ref 4.40–5.40)
CALCIUM IONIZED ARTERIAL (MG/DL): 4.86 mg/dL (ref 4.40–5.40)
CALCIUM IONIZED ARTERIAL (MG/DL): 4.59 mg/dL (ref 4.40–5.40)
GLUCOSE WHOLE BLOOD: 148 mg/dL (ref 70–179)
GLUCOSE WHOLE BLOOD: 156 mg/dL (ref 70–179)
GLUCOSE WHOLE BLOOD: 287 mg/dL — ABNORMAL HIGH (ref 70–179)
HCO3 ARTERIAL: 17 mmol/L — ABNORMAL LOW (ref 22–27)
HCO3 ARTERIAL: 19 mmol/L — ABNORMAL LOW (ref 22–27)
HCO3 ARTERIAL: 17 mmol/L — ABNORMAL LOW (ref 22–27)
HEMOGLOBIN BLOOD GAS: 7 g/dL — ABNORMAL LOW
HEMOGLOBIN BLOOD GAS: 7.9 g/dL — ABNORMAL LOW
HEMOGLOBIN BLOOD GAS: 7.8 g/dL — ABNORMAL LOW
LACTATE BLOOD ARTERIAL: 0.5 mmol/L (ref <1.3)
LACTATE BLOOD ARTERIAL: 0.4 mmol/L (ref <1.3)
LACTATE BLOOD ARTERIAL: 1.3 mmol/L — ABNORMAL HIGH (ref <1.3)
O2 SATURATION ARTERIAL: 97.2 % (ref 94.0–100.0)
O2 SATURATION ARTERIAL: 96 % (ref 94.0–100.0)
O2 SATURATION ARTERIAL: 97.9 % (ref 94.0–100.0)
PCO2 ARTERIAL: 32.7 mmHg — ABNORMAL LOW (ref 35.0–45.0)
PCO2 ARTERIAL: 40 mmHg (ref 35.0–45.0)
PCO2 ARTERIAL: 36.5 mmHg (ref 35.0–45.0)
PH ARTERIAL: 7.32 — ABNORMAL LOW (ref 7.35–7.45)
PH ARTERIAL: 7.3 — ABNORMAL LOW (ref 7.35–7.45)
PH ARTERIAL: 7.3 — ABNORMAL LOW (ref 7.35–7.45)
PO2 ARTERIAL: 81.7 mmHg (ref 80.0–110.0)
PO2 ARTERIAL: 81.1 mmHg (ref 80.0–110.0)
PO2 ARTERIAL: 96.4 mmHg (ref 80.0–110.0)
POTASSIUM WHOLE BLOOD: 3.6 mmol/L (ref 3.4–4.6)
POTASSIUM WHOLE BLOOD: 4.1 mmol/L (ref 3.4–4.6)
POTASSIUM WHOLE BLOOD: 4.2 mmol/L (ref 3.4–4.6)
SODIUM WHOLE BLOOD: 142 mmol/L (ref 135–145)
SODIUM WHOLE BLOOD: 140 mmol/L (ref 135–145)
SODIUM WHOLE BLOOD: 136 mmol/L (ref 135–145)

## 2022-02-08 LAB — MAGNESIUM
MAGNESIUM: 2.3 mg/dL (ref 1.6–2.6)
MAGNESIUM: 2.2 mg/dL (ref 1.6–2.6)
MAGNESIUM: 2.1 mg/dL (ref 1.6–2.6)

## 2022-02-08 LAB — BASIC METABOLIC PANEL
ANION GAP: 10 mmol/L (ref 5–14)
ANION GAP: 11 mmol/L (ref 5–14)
ANION GAP: 10 mmol/L (ref 5–14)
BLOOD UREA NITROGEN: 37 mg/dL — ABNORMAL HIGH (ref 9–23)
BLOOD UREA NITROGEN: 38 mg/dL — ABNORMAL HIGH (ref 9–23)
BLOOD UREA NITROGEN: 42 mg/dL — ABNORMAL HIGH (ref 9–23)
BUN / CREAT RATIO: 18
BUN / CREAT RATIO: 17
BUN / CREAT RATIO: 18
CALCIUM: 7.8 mg/dL — ABNORMAL LOW (ref 8.7–10.4)
CALCIUM: 8 mg/dL — ABNORMAL LOW (ref 8.7–10.4)
CALCIUM: 7.9 mg/dL — ABNORMAL LOW (ref 8.7–10.4)
CHLORIDE: 108 mmol/L — ABNORMAL HIGH (ref 98–107)
CHLORIDE: 108 mmol/L — ABNORMAL HIGH (ref 98–107)
CHLORIDE: 107 mmol/L (ref 98–107)
CO2: 21 mmol/L (ref 20.0–31.0)
CO2: 21 mmol/L (ref 20.0–31.0)
CO2: 20 mmol/L (ref 20.0–31.0)
CREATININE: 2.11 mg/dL — ABNORMAL HIGH
CREATININE: 2.25 mg/dL — ABNORMAL HIGH
CREATININE: 2.34 mg/dL — ABNORMAL HIGH
EGFR CKD-EPI (2021) MALE: 35 mL/min/{1.73_m2} — ABNORMAL LOW (ref >=60)
EGFR CKD-EPI (2021) MALE: 32 mL/min/{1.73_m2} — ABNORMAL LOW (ref >=60)
EGFR CKD-EPI (2021) MALE: 31 mL/min/{1.73_m2} — ABNORMAL LOW (ref >=60)
GLUCOSE RANDOM: 174 mg/dL — ABNORMAL HIGH (ref 70–99)
GLUCOSE RANDOM: 166 mg/dL (ref 70–179)
GLUCOSE RANDOM: 285 mg/dL — ABNORMAL HIGH (ref 70–179)
POTASSIUM: 4.3 mmol/L (ref 3.4–4.8)
POTASSIUM: 4.3 mmol/L (ref 3.4–4.8)
POTASSIUM: 4.3 mmol/L (ref 3.4–4.8)
SODIUM: 139 mmol/L (ref 135–145)
SODIUM: 140 mmol/L (ref 135–145)
SODIUM: 137 mmol/L (ref 135–145)

## 2022-02-08 LAB — CMV IGG: CMV IGG: POSITIVE — AB

## 2022-02-08 LAB — CBC
HEMATOCRIT: 23.7 % — ABNORMAL LOW (ref 39.0–48.0)
HEMATOCRIT: 22.6 % — ABNORMAL LOW (ref 39.0–48.0)
HEMATOCRIT: 23.5 % — ABNORMAL LOW (ref 39.0–48.0)
HEMOGLOBIN: 8.3 g/dL — ABNORMAL LOW (ref 12.9–16.5)
HEMOGLOBIN: 7.8 g/dL — ABNORMAL LOW (ref 12.9–16.5)
HEMOGLOBIN: 8.3 g/dL — ABNORMAL LOW (ref 12.9–16.5)
MEAN CORPUSCULAR HEMOGLOBIN CONC: 34.9 g/dL (ref 32.0–36.0)
MEAN CORPUSCULAR HEMOGLOBIN CONC: 34.7 g/dL (ref 32.0–36.0)
MEAN CORPUSCULAR HEMOGLOBIN CONC: 35.2 g/dL (ref 32.0–36.0)
MEAN CORPUSCULAR HEMOGLOBIN: 33.3 pg — ABNORMAL HIGH (ref 25.9–32.4)
MEAN CORPUSCULAR HEMOGLOBIN: 33.2 pg — ABNORMAL HIGH (ref 25.9–32.4)
MEAN CORPUSCULAR HEMOGLOBIN: 33.7 pg — ABNORMAL HIGH (ref 25.9–32.4)
MEAN CORPUSCULAR VOLUME: 95.3 fL (ref 77.6–95.7)
MEAN CORPUSCULAR VOLUME: 95.6 fL (ref 77.6–95.7)
MEAN CORPUSCULAR VOLUME: 95.8 fL — ABNORMAL HIGH (ref 77.6–95.7)
MEAN PLATELET VOLUME: 9.8 fL (ref 6.8–10.7)
MEAN PLATELET VOLUME: 9.7 fL (ref 6.8–10.7)
MEAN PLATELET VOLUME: 10 fL (ref 6.8–10.7)
PLATELET COUNT: 18 10*9/L — ABNORMAL LOW (ref 150–450)
PLATELET COUNT: 17 10*9/L — ABNORMAL LOW (ref 150–450)
PLATELET COUNT: 18 10*9/L — ABNORMAL LOW (ref 150–450)
RED BLOOD CELL COUNT: 2.49 10*12/L — ABNORMAL LOW (ref 4.26–5.60)
RED BLOOD CELL COUNT: 2.37 10*12/L — ABNORMAL LOW (ref 4.26–5.60)
RED BLOOD CELL COUNT: 2.46 10*12/L — ABNORMAL LOW (ref 4.26–5.60)
RED CELL DISTRIBUTION WIDTH: 18.7 % — ABNORMAL HIGH (ref 12.2–15.2)
RED CELL DISTRIBUTION WIDTH: 18.9 % — ABNORMAL HIGH (ref 12.2–15.2)
RED CELL DISTRIBUTION WIDTH: 18.5 % — ABNORMAL HIGH (ref 12.2–15.2)
WBC ADJUSTED: 5.6 10*9/L (ref 3.6–11.2)
WBC ADJUSTED: 5.9 10*9/L (ref 3.6–11.2)
WBC ADJUSTED: 5.4 10*9/L (ref 3.6–11.2)

## 2022-02-08 LAB — HEPATIC FUNCTION PANEL
ALBUMIN: 3.8 g/dL (ref 3.4–5.0)
ALBUMIN: 4.1 g/dL (ref 3.4–5.0)
ALBUMIN: 4.3 g/dL (ref 3.4–5.0)
ALKALINE PHOSPHATASE: 49 U/L (ref 46–116)
ALKALINE PHOSPHATASE: 52 U/L (ref 46–116)
ALKALINE PHOSPHATASE: 54 U/L (ref 46–116)
ALT (SGPT): 193 U/L — ABNORMAL HIGH (ref 10–49)
ALT (SGPT): 172 U/L — ABNORMAL HIGH (ref 10–49)
ALT (SGPT): 149 U/L — ABNORMAL HIGH (ref 10–49)
AST (SGOT): 107 U/L — ABNORMAL HIGH (ref <=34)
AST (SGOT): 88 U/L — ABNORMAL HIGH (ref <=34)
AST (SGOT): 69 U/L — ABNORMAL HIGH (ref <=34)
BILIRUBIN DIRECT: 0.7 mg/dL — ABNORMAL HIGH (ref 0.00–0.30)
BILIRUBIN DIRECT: 0.9 mg/dL — ABNORMAL HIGH (ref 0.00–0.30)
BILIRUBIN DIRECT: 0.8 mg/dL — ABNORMAL HIGH (ref 0.00–0.30)
BILIRUBIN TOTAL: 1 mg/dL (ref 0.3–1.2)
BILIRUBIN TOTAL: 1.1 mg/dL (ref 0.3–1.2)
BILIRUBIN TOTAL: 1.1 mg/dL (ref 0.3–1.2)
PROTEIN TOTAL: 5.4 g/dL — ABNORMAL LOW (ref 5.7–8.2)
PROTEIN TOTAL: 5.6 g/dL — ABNORMAL LOW (ref 5.7–8.2)
PROTEIN TOTAL: 5.7 g/dL (ref 5.7–8.2)

## 2022-02-08 LAB — VARICELLA ZOSTER ANTIBODY, IGG: VARICELLA ZOSTER IGG: POSITIVE

## 2022-02-08 LAB — TOXOPLASMA GONDII ANTIBODY, IGG: TOXOPLASMA GONDII IGG: NEGATIVE

## 2022-02-08 LAB — PHOSPHORUS
PHOSPHORUS: 6.8 mg/dL — ABNORMAL HIGH (ref 2.4–5.1)
PHOSPHORUS: 6.5 mg/dL — ABNORMAL HIGH (ref 2.4–5.1)
PHOSPHORUS: 6.3 mg/dL — ABNORMAL HIGH (ref 2.4–5.1)

## 2022-02-08 LAB — HSV ANTIBODIES, IGG
HERPES SIMPLEX VIRUS 1 IGG: POSITIVE — AB
HERPES SIMPLEX VIRUS 2 IGG: NEGATIVE
HSV 1 IGG OD: 37.5

## 2022-02-08 LAB — CREATININE, URINE: CREATININE, URINE: 72.7 mg/dL

## 2022-02-08 LAB — TOXOPLASMA GONDII ANTIBODY, IGM: TOXOPLASMA IGM ANTIBODY: NEGATIVE

## 2022-02-08 LAB — TRANSPLANT IMMUNE STATUS - EBV: EPSTEIN-BARR VCA IGG ANTIBODY: POSITIVE — AB

## 2022-02-08 LAB — OSMOLALITY, RANDOM URINE: OSMOLALITY URINE: 423 mosm/kg

## 2022-02-08 LAB — SODIUM, URINE, RANDOM: SODIUM URINE: 22 mmol/L

## 2022-02-08 LAB — TACROLIMUS LEVEL, TROUGH: TACROLIMUS, TROUGH: 4.4 ng/mL — ABNORMAL LOW (ref 5.0–15.0)

## 2022-02-08 MED ADMIN — pantoprazole (PROTONIX) injection 40 mg: 40 mg | INTRAVENOUS | @ 10:00:00 | Stop: 2022-02-08

## 2022-02-08 MED ADMIN — methylPREDNISolone sodium succinate (PF) (SOLU-medrol) injection 50 mg: 50 mg | INTRAVENOUS | @ 13:00:00 | Stop: 2022-02-08

## 2022-02-08 MED ADMIN — piperacillin-tazobactam (ZOSYN) 3.375 g in sodium chloride 0.9 % (NS) 100 mL IVPB-MBP: 3.375 g | INTRAVENOUS | @ 14:00:00 | Stop: 2022-02-11

## 2022-02-08 MED ADMIN — furosemide (LASIX) injection 60 mg: 60 mg | INTRAVENOUS | @ 15:00:00 | Stop: 2022-02-08

## 2022-02-08 MED ADMIN — albumin human 5 % 5 % bottle: 75 mL/h | INTRAVENOUS | @ 11:00:00 | Stop: 2022-02-08

## 2022-02-08 MED ADMIN — insulin regular (HumuLIN,NovoLIN) injection 0-20 Units: 0-20 [IU] | SUBCUTANEOUS | @ 10:00:00

## 2022-02-08 MED ADMIN — tacrolimus (PROGRAF) capsule 4 mg: 4 mg | ORAL | @ 10:00:00

## 2022-02-08 MED ADMIN — albumin human 5 % 5 % bottle 25 g: 25 g | INTRAVENOUS | @ 13:00:00 | Stop: 2022-02-08

## 2022-02-08 MED ADMIN — piperacillin-tazobactam (ZOSYN) 3.375 g in sodium chloride 0.9 % (NS) 100 mL IVPB-MBP: 3.375 g | INTRAVENOUS | @ 21:00:00 | Stop: 2022-02-11

## 2022-02-08 MED ADMIN — HYDROmorphone (PF) (DILAUDID) injection 1 mg: 1 mg | INTRAVENOUS | @ 04:00:00 | Stop: 2022-02-08

## 2022-02-08 MED ADMIN — calcium gluc in NaCl, iso-osm 1 gram/50 mL IVPB 1 g: 1 g | INTRAVENOUS | @ 10:00:00

## 2022-02-08 MED ADMIN — melatonin tablet 3 mg: 3 mg | ORAL | @ 22:00:00

## 2022-02-08 MED ADMIN — valGANciclovir (VALCYTE) tablet 450 mg: 450 mg | ORAL | @ 13:00:00

## 2022-02-08 MED ADMIN — sodium chloride (NS) 0.9 % infusion: 100 mL/h | INTRAVENOUS | @ 04:00:00

## 2022-02-08 MED ADMIN — HYDROmorphone (PF) (DILAUDID) injection 1 mg: 1 mg | INTRAVENOUS | @ 16:00:00 | Stop: 2022-02-21

## 2022-02-08 MED ADMIN — methocarbamoL (ROBAXIN) tablet 500 mg: 500 mg | ORAL | @ 17:00:00

## 2022-02-08 MED ADMIN — insulin regular (HumuLIN,NovoLIN) injection 0-20 Units: 0-20 [IU] | SUBCUTANEOUS | @ 22:00:00

## 2022-02-08 MED ADMIN — insulin regular (HumuLIN,NovoLIN) injection 0-20 Units: 0-20 [IU] | SUBCUTANEOUS | @ 04:00:00

## 2022-02-08 MED ADMIN — piperacillin-tazobactam (ZOSYN) 3.375 g in sodium chloride 0.9 % (NS) 100 mL IVPB-MBP: 3.375 g | INTRAVENOUS | @ 01:00:00 | Stop: 2022-02-11

## 2022-02-08 MED ADMIN — albumin human 25 % bottle 25 g: 25 g | INTRAVENOUS | @ 17:00:00

## 2022-02-08 MED ADMIN — ondansetron (ZOFRAN) injection 4 mg: 4 mg | INTRAVENOUS | Stop: 2022-02-07

## 2022-02-08 MED ADMIN — gabapentin (NEURONTIN) capsule 300 mg: 300 mg | ORAL

## 2022-02-08 MED ADMIN — albumin human 5 % 5 % bottle: 75 mL/h | INTRAVENOUS | @ 04:00:00

## 2022-02-08 MED ADMIN — albumin human 5 % 5 % bottle: 75 mL/h | INTRAVENOUS | @ 07:00:00 | Stop: 2022-02-08

## 2022-02-08 MED ADMIN — hydrALAZINE (APRESOLINE) injection 10 mg: 10 mg | INTRAVENOUS | @ 21:00:00

## 2022-02-08 MED ADMIN — albumin human 25 % bottle 25 g: 25 g | INTRAVENOUS | @ 22:00:00

## 2022-02-08 MED ADMIN — mycophenolate (CELLCEPT) tablet 1,000 mg: 1000 mg | ORAL

## 2022-02-08 MED ADMIN — insulin NPH (HumuLIN,NovoLIN) injection 12 Units: 12 [IU] | SUBCUTANEOUS

## 2022-02-08 MED ADMIN — insulin regular (HumuLIN,NovoLIN) injection 0-20 Units: 0-20 [IU] | SUBCUTANEOUS | @ 18:00:00

## 2022-02-08 MED ADMIN — nystatin (MYCOSTATIN) oral suspension: 10 mL | ORAL | @ 01:00:00

## 2022-02-08 MED ADMIN — HYDROmorphone (PF) (DILAUDID) injection 1 mg: 1 mg | INTRAVENOUS | @ 21:00:00 | Stop: 2022-02-21

## 2022-02-08 MED ADMIN — albumin human 25 % bottle 25 g: 25 g | INTRAVENOUS | @ 10:00:00

## 2022-02-08 MED ADMIN — piperacillin-tazobactam (ZOSYN) 3.375 g in sodium chloride 0.9 % (NS) 100 mL IVPB-MBP: 3.375 g | INTRAVENOUS | @ 07:00:00 | Stop: 2022-02-11

## 2022-02-08 MED ADMIN — mycophenolate (CELLCEPT) tablet 1,000 mg: 1000 mg | ORAL | @ 12:00:00

## 2022-02-08 MED ADMIN — nystatin (MYCOSTATIN) oral suspension: 10 mL | ORAL | @ 18:00:00

## 2022-02-08 MED ADMIN — insulin NPH (HumuLIN,NovoLIN) injection 12 Units: 12 [IU] | SUBCUTANEOUS | @ 13:00:00

## 2022-02-08 MED ADMIN — polyethylene glycol (MIRALAX) packet 17 g: 17 g | ORAL | @ 13:00:00

## 2022-02-08 MED ADMIN — tacrolimus (PROGRAF) capsule 4 mg: 4 mg | ORAL | @ 22:00:00

## 2022-02-08 MED ADMIN — albumin human 5 % 5 % bottle 25 g: 25 g | INTRAVENOUS | @ 01:00:00 | Stop: 2022-02-07

## 2022-02-08 MED ADMIN — nystatin (MYCOSTATIN) oral suspension: 10 mL | ORAL | @ 10:00:00

## 2022-02-08 MED ADMIN — methocarbamoL (ROBAXIN) tablet 500 mg: 500 mg | ORAL | @ 21:00:00

## 2022-02-08 MED ADMIN — oxyCODONE (ROXICODONE) immediate release tablet 10 mg: 10 mg | ORAL | @ 12:00:00 | Stop: 2022-02-20

## 2022-02-08 NOTE — Unmapped (Signed)
OCCUPATIONAL THERAPY  Evaluation (02/08/22 1103)    Patient Name:  Matthew Key       Medical Record Number: [id number]   Date of Birth: June 09, 1961  Sex: Male            OT Treatment Diagnosis:  ADL evaluation s/p liver transplant    Assessment  Problem List: Impaired ADLs, Fall risk, Decreased endurance, Pain, Decreased mobility, Impaired balance    Assessment: Matthew Key is a 61 y.o. 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  Pt presented to OT eval with decreased independence with ADLs and functional mobility due to above stated deficits. Pt agreeable and motivated for therapy. Wife at bedside offering support. Pt will benefit from continued skilled acute OT to address above stated deficits to increase pt's independence, safety and decrease his burden of care.  Review of pt's occupational profile, client history, assessment of occupational performance, clinical decision making and development of POC required low complexity OT evaluation.         Today's Interventions: Other  Today's Interventions: Pt edu. on role of OT, POC, and abdominal precautions. Pt performed sup>EOB with CGA, simulated grooming tasks ( washing face) at EOB set up, simulated UBD (don/doff gown) at EOB set up, LBD (don B socks) with total A, sit<>stand transfer x2 with Min A+2ppl, and functional mobility ~8 steps from bed>chair in total with Min A+2ppl, simulated BUE/BLE/trunk requirements to practice using LH AE for LB ADLs in future sessions.    Activity Tolerance During Today's Session  Limited by fatigue, Limited by pain    Plan  Planned Frequency of Treatment:  1-2x per day for: 3-4x week  Planned Treatment Duration: 02/22/22    Planned Interventions:  Adaptive equipment, Balance activities, ADL retraining, Bed mobility, Compensatory tech. training, Conservation, Education - Patient, Education - Family / caregiver, Endurance activities, Home exercise program, Functional mobility, Environmental support, Teacher, early years/pre, Therapeutic exercise, Safety education, UE Strength / coordination exercise    Post-Discharge Occupational Therapy Recommendations:   5x weekly, High intensity   OT DME Recommendations: Defer to post acute -        GOALS:   Patient and Family Goals: To return home    IP Long Term Goal #1: Pt will score 24/24 on AMPAC within 6 weeks       Short Term:  SHORT GOAL #1: Pt will complete toilet transfer mod I using LRAD.   Time Frame : 2 weeks  SHORT GOAL #2: Pt will complete full LBD with MOD I with AE PRN.   Time Frame : 2 weeks  SHORT GOAL #3: Pt will complete > 5 minute standing bimanual ADL with MOD I.   Time Frame : 2 weeks  SHORT GOAL #4: Pt will complete toileting mod I using LRAD.   Time Frame : 2 weeks            Prognosis:  Good  Positive Indicators:  LBD dressing equipment at home, family support  Barriers to Discharge: Endurance deficits, Gait instability, Impaired Balance, Inability to safely perform ADLS, Pain    Subjective  Current Status Pt received supine in bed/ left reclined in chair, call bell within reach, wife at bedside, RN Emme made aware  Prior Functional Status Pt reports being MOD I with ADL/IADL, using sock aid, reacher, and long handled sponge PRN. Pt endorses uses SPC  for mobility and having a few falls in the past year. Pt endorses family assists with transportation and can assist with IADLs as needed.            Patient / Caregiver reports: I want to get to the chair.    Past Medical History:   Diagnosis Date    AKI (acute kidney injury) (CMS-HCC) 12/14/2020    Anxiety 10/22/2013    Arthritis     Cervical radiculopathy 12/03/2016    Chronic pain disorder     Lower back    Cirrhosis (CMS-HCC)     Dental abscess 10/2020    Duodenal ulcer 12/01/2017    GERD (gastroesophageal reflux disease)     History of transfusion     Hyperlipidemia 10/22/2013 Hypertension     under control with meds and weight loss    Hypotension 01/29/2022    Liver disease     Sleep apnea, obstructive     Have machine    Stroke (CMS-HCC)     mild stroke    Type 2 diabetes mellitus with diabetic neuropathy, with long-term current use of insulin (CMS-HCC) 06/09/2014    Social History     Tobacco Use    Smoking status: Never    Smokeless tobacco: Never   Substance Use Topics    Alcohol use: Not Currently      Past Surgical History:   Procedure Laterality Date    KNEE SURGERY      PR CATH PLACE/CORON ANGIO, IMG SUPER/INTERP,R&L HRT CATH, L HRT VENTRIC N/A 12/19/2020    Procedure: CATH LEFT/RIGHT HEART CATHETERIZATION;  Surgeon: Rosana Hoes, MD;  Location: [facility] CATH;  Service: Cardiology    PR COLONOSCOPY FLX DX W/COLLJ SPEC WHEN PFRMD N/A 12/07/2021    Procedure: COLONOSCOPY, FLEXIBLE, PROXIMAL TO SPLENIC FLEXURE; DIAGNOSTIC, W/WO COLLECTION SPECIMEN BY BRUSH OR WASH;  Surgeon: Annie Paras, MD;  [facility]: [facility];  Service: Gastroenterology    PR TRANSPLANT LIVER,ALLOTRANSPLANT N/A 02/06/2022    Procedure: LIVER ALLOTRANSPLANTATION; ORTHOTOPIC, PARTIAL OR WHOLE, FROM CADAVER OR LIVING DONOR, ANY AGE;  Surgeon: Florene Glen, MD;  Location: MAIN OR [place];  Service: Transplant    PR TRANSPLANT,PREP DONOR LIVER/ARTERIAL N/A 02/06/2022    Procedure: BACKBNCH RECONSTRUCT OF CAD/LIVE DONOR LIVER GFT PRIOR TRANSPLANT; ARTERIAL ANASTAMOSIS, EA;  Surgeon: Florene Glen, MD;  Location: MAIN OR [place];  Service: Transplant    PR UPPER GI ENDOSCOPY,BIOPSY N/A 10/12/2020    Procedure: UGI ENDOSCOPY; WITH BIOPSY, SINGLE OR MULTIPLE;  Surgeon: Marene Lenz, MD;  [facility]: [facility];  Service: Gastroenterology    PR UPPER GI ENDOSCOPY,DIAGNOSIS N/A 01/03/2022    Procedure: UGI ENDO, INCLUDE ESOPHAGUS, STOMACH, & DUODENUM &/OR JEJUNUM; DX W/WO COLLECTION SPECIMN, BY BRUSH OR WASH;  Surgeon: Marene Lenz, MD;  [facility];  Service: Gastroenterology    PR UPPER GI ENDOSCOPY,LIGAT VARIX N/A 12/07/2021    Procedure: UGI ENDO; Everlene Balls LIG ESOPH &/OR GASTRIC VARICES;  Surgeon: Annie Paras, MD;  Location: [facility];  Service: Gastroenterology    ROOT CANAL      Front teeth    Family History   Problem Relation Age of Onset    Edema Mother     Alzheimer's disease Father     Aortic dissection Brother     Early death Brother     Aneurysm Brother         [place]-honey bee     Objective Findings  Precautions / Restrictions  Falls precautions, Protective precautions (post-op abdominal precautions)    Weight Bearing  Non-applicable    Required Braces or Orthoses  Non-applicable    Communication Preference  Verbal    Pain  Pt endorsed having 7/10 pain in his stomach and back    Equipment / Environment  JP drain(s), Vascular access (PIV, TLC, Port-a-cath, PICC), Foley, Supplemental oxygen, Telemetry, Arterial line    Living Situation  Living Environment: Apartment  Lives With: Spouse  Home Living: One level home, Level entry, Standard height toilet, Tub/shower unit, Shower chair with back  Equipment available at home: Rolling walker, Straight cane, Paediatric nurse with back, Long-Handled Shoehorn, Long-Handled Sponge, Media planner   Orientation Level:  Oriented x 4   Arousal/Alertness:  Appropriate responses to stimuli   Attention Span:  Appears intact   Memory:  Appears intact   Following Commands:  Follows all commands and directions without difficulty   Safety Judgment:  Good awareness of safety precautions   Awareness of Errors:  Good awareness of errors made   Problem Solving:  Able to problem solve independently      Vision / Hearing   Vision: Wears glasses for reading only     Hearing: No deficit identified         Hand Function:  Right Hand Function: Right hand grip strength, ROM and coordination WNL  Left Hand Function: Left hand grip strength, ROM and coordination WNL  Hand Dominance: Right    Skin Inspection:  Skin Inspection comment: abdominal distension with significant drainage from around RLQ JP drains, RN Emme in to assess and reinforce    ROM / Strength:  UE ROM/Strength: Left WFL, Right WFL  LE ROM/Strength: Left WFL, Right WFL    Coordination:  Coordination: WFL    Sensation:  RUE Sensation: RUE intact  LUE Sensation: LUE intact  RLE Sensation: RLE intact  LLE Sensation: LLE intact  Sensory/ Proprioception/ Stereognosis comments: pt reports B hands colder than usual post op with R hand feeling colder than L.    Balance:  static/dynamic sitting balance SBA, static standing balance Min A +2    Functional Mobility  Transfers - Needs Assistance: Min assist  Bed Mobility Assistance Needed: Yes  Bed Mobility - Needs Assistance: Contact Guard assist  Ambulation: Pt completed functional mobility ~8 steps from bed>chair with Min A+2ppl      ADLs  ADLs: Needs assistance with ADLs  ADLs - Needs Assistance: LB dressing, UB dressing, Toileting, Bathing, Grooming  Feeding - Needs Assistance: Set Up Assist  Grooming - Needs Assistance: Set Up Assist, Performed seated  Bathing - Needs Assistance: Performed seated, Mod assist (able to simulate washing BUE, chest, stomach and privates)  Toileting - Needs Assistance: Total Assist  UB Dressing - Needs Assistance: Set Up Assist, Performed seated  LB Dressing - Needs Assistance: Total Assist      Vitals / Orthostatics  At Rest: NAD  With Activity: NAD  Vitals/Orthostatics: asymptomatic      Medical Staff Made Aware: RN made aware      Occupational Therapy Session Duration  OT Individual [mins]: 10  OT Co-Treatment [mins]: 26 Baird Lyons PT)  Reason for Co-treatment: Poor pain control, To safely progress mobility       AM-PAC-Daily Activity  Lower Body Dressing assistance needs: Unable to do/total assistance - Total Dependent Assist  Bathing assistance needs: A lot - Maximum/Moderate Assistance  Toileting assistance needs: Unable to do/total assistance -  Total Dependent Assist  Upper Body Dressing assistance needs: A Little - Minimal/Contact Guard Assist/Supervision  Personal Grooming assistance needs: A Little - Minimal/Contact Guard Assist/Supervision  Eating Meals assistance needs: None - Modified Independent/Independent    Daily Activity Score:  Daily Activity Score: 14    Score (in points): % of Functional Impairment, Limitation, Restriction  6: 100% impaired, limited, restricted  7-8: At least 80%, but less than 100% impaired, limited restricted  9-13: At least 60%, but less than 80% impaired, limited restricted  14-19: At least 40%, but less than 60% impaired, limited restricted  20-22: At least 20%, but less than 40% impaired, limited restricted  23: At least 1%, but less than 20% impaired, limited restricted  24: 0% impaired, limited restricted      I attest that I have reviewed the above information.  Signed: Virgina Evener, OT  Filed 02/08/2022    The care for this patient was completed by Virgina Evener, OT:  A student was present and Observed patient care.    Virgina Evener, OT

## 2022-02-08 NOTE — Unmapped (Signed)
Transplant Surgery Progress Note    Date of service: 02/08/2022    Hospital Day:  LOS: 2 days   Surgery Date(s): 02/06/22  Consultation at the request of Surgical Attending Chirag Sureshchandra Des* for the evaluation and management of OLT.  ICU Attending: Dr. Leonette Most    Interval History:   NAEON. Afebrile and hemodynamically stable. No pressors or boluses overnight. Hgb and PLT trended for possible transfusions.     Assessment/Plan:  Matthew Key is a 61 y.o. 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     Neurological:    *Pain and sedation  - MMPC now patient is extubated    Cardiovascular:   *MAP goal >65  - Off of pressors    Pulmonary:   *Extubated to 2L [place] on 02/07/22 after passed SBT  - Pulm toilet/OOB    Renal/Genitourinary:  *Strict I&Os via foley catheter  - Trend BUN/Cr  - Plan to TOV on 02/08/22  - 60mg  IV lasix today; may redose in PM pending TOV and response to lasix    GI/Nutrition:  F: ML  E: Replete PRN  N: FLD no carbonation    *S/p OLT on 02/06/22  - q12 labs   - Liver transplant doppler: WNL  - Surgical drains to bulb suction x2  - 25% albumin BID    Heme:   DVT ppx: holding given thrombocytopenia  - q12 CBC    Induction immunosuppression: methylpred and basiliximab  Maintenance immunosuppression: MMF and tac    ID:  - Zosyn    Dispo: Transfer to SDS    Objective:    Physical Exam:    General: NAD  HEENT: Interval removal of NGT  CV: Regular rate per telemetry  Resp: Symmetric chest rise on 2L nasal cannula  Abdomen: Soft, mildly distended, surgical drains w/ serosanguionous output x2. Mercedes incision with appropriate strikethrough  Extremities: WWP    Data Review:   I have reviewed the labs and studies from the last 24 hours.    Vitals Reviewed:    Temp:  [36.6 ??C (97.9 ??F)-37.4 ??C (99.3 ??F)] 36.7 ??C (98.1 ??F)  Heart Rate:  [60-98] 77  SpO2 Pulse:  [59-100] 74  Resp:  [8-23] 17  A BP-1: (120-172)/(65-109) 166/75  MAP:  [95 mmHg-147 mmHg] 111 mmHg  SpO2:  [87 %-99 %] 91 %   Temp (24hrs), Avg:36.9 ??C (98.5 ??F), Min:36.6 ??C (97.9 ??F), Max:37.4 ??C (99.3 ??F)     SpO2: 91 %   Height: 172.7 cm (5' 7.99)    Weight: (!) 105 kg (231 lb 7.7 oz)    Body mass index is 35.21 kg/m??.    Body surface area is 2.24 meters squared.       Intake/Output Summary (Last 24 hours) at 02/08/2022 1232  Last data filed at 02/08/2022 1000  Gross per 24 hour   Intake 3273.75 ml   Output 1605 ml   Net 1668.75 ml        I/O last 3 completed shifts:  In: 6121.9 [P.O.:120; I.V.:3478.1; NG/GT:120; IV Piggyback:2403.8]  Out: 2685 [Urine:1665; Emesis/NG output:40; Drains:980]   I/O this shift:  In: 160 [IV Piggyback:160]  Out: 400 [Urine:180; Drains:220]      Continuous Infusions:   ??? lactated Ringers           Hemodynamic/Invasive Device Data (24 hrs):  A BP-1: (120-172)/(65-109) 166/75  MAP:  [  95 mmHg-147 mmHg] 111 mmHg            Ventilation/Oxygen Therapy (24hrs):  O2 Device: Nasal cannula  O2 Flow Rate (L/min):  [2 L/min-6 L/min] (P) 6 L/min    Tubes and Drains:  Patient Lines/Drains/Airways Status     Active Active Lines, Drains, & Airways     Name Placement date Placement time Site Days    Introducer 02/06/22 Internal jugular Right 02/06/22  0410  Internal jugular  2    CVC MAC Introducer 02/06/22 Right Internal jugular 02/06/22  1000  Internal jugular  2    Closed/Suction Drain 1 Anterior RLQ Bulb 10 Fr. 02/06/22  1052  RLQ  2    Closed/Suction Drain 2 Anterior;Lateral RLQ Bulb 10 Fr. 02/06/22  1150  RLQ  2    Urethral Catheter Non-latex 16 Fr. 02/06/22  0400  Non-latex  2    Peripheral IV 02/05/22 Anterior;Left Forearm 02/05/22  1726  Forearm  2    Arterial Line 02/06/22 Left Radial 02/06/22  0426  Radial  2                Idalia Needle, MD  PGY-3 General Surgery

## 2022-02-08 NOTE — Unmapped (Signed)
Patient extubated at start of shift, [place] at 2 liters with no issues. NSR, afebrile, MAPs at goal. PRN pain meds given with adequate control. C/o numbness in right hand, MD aware and at bedside, warm to touch, pulses palpable. JP drains emptied q4 hrs, 2-533ml 5% albumin given this shift. Transitioned off endotool. Family at bedside.  Problem: Adult Inpatient Plan of Care  Goal: Plan of Care Review  Outcome: Progressing  Goal: Patient-Specific Goal (Individualized)  Outcome: Progressing  Goal: Absence of Hospital-Acquired Illness or Injury  Outcome: Progressing  Intervention: Identify and Manage Fall Risk  Recent Flowsheet Documentation  Taken 02/07/2022 0800 by Maple Mirza, RN  Safety Interventions:   aspiration precautions   bed alarm   bleeding precautions   environmental modification   infection management   lighting adjusted for tasks/safety   low bed  Intervention: Prevent Skin Injury  Recent Flowsheet Documentation  Taken 02/07/2022 0800 by Maple Mirza, RN  Skin Protection:   adhesive use limited   incontinence pads utilized   pulse oximeter probe site changed   silicone foam dressing in place   skin sealant/moisture barrier applied   skin-to-device areas padded   skin-to-skin areas padded   transparent dressing maintained   tubing/devices free from skin contact  Intervention: Prevent and Manage VTE (Venous Thromboembolism) Risk  Recent Flowsheet Documentation  Taken 02/07/2022 1600 by Maple Mirza, RN  Activity Management: activity adjusted per tolerance  Taken 02/07/2022 1400 by Maple Mirza, RN  Activity Management: activity adjusted per tolerance  Taken 02/07/2022 1200 by Maple Mirza, RN  Activity Management: activity adjusted per tolerance  Taken 02/07/2022 0800 by Maple Mirza, RN  Activity Management: bedrest  VTE Prevention/Management: anticoagulant therapy  Range of Motion: Bilateral Upper and Lower Extremities  Intervention: Prevent Infection  Recent Flowsheet Documentation  Taken 02/07/2022 0800 by Maple Mirza, RN  Infection Prevention:   cohorting utilized   environmental surveillance performed   equipment surfaces disinfected   hand hygiene promoted   personal protective equipment utilized   rest/sleep promoted   single patient room provided   visitors restricted/screened  Goal: Optimal Comfort and Wellbeing  Outcome: Progressing  Goal: Readiness for Transition of Care  Outcome: Progressing  Goal: Rounds/Family Conference  Outcome: Progressing     Problem: Infection  Goal: Absence of Infection Signs and Symptoms  Outcome: Progressing  Intervention: Prevent or Manage Infection  Recent Flowsheet Documentation  Taken 02/07/2022 0800 by Maple Mirza, RN  Infection Management: aseptic technique maintained  Isolation Precautions: protective precautions maintained     Problem: Impaired Wound Healing  Goal: Optimal Wound Healing  Outcome: Progressing  Intervention: Promote Wound Healing  Recent Flowsheet Documentation  Taken 02/07/2022 1600 by Maple Mirza, RN  Activity Management: activity adjusted per tolerance  Taken 02/07/2022 1400 by Maple Mirza, RN  Activity Management: activity adjusted per tolerance  Taken 02/07/2022 1200 by Maple Mirza, RN  Activity Management: activity adjusted per tolerance  Taken 02/07/2022 0800 by Maple Mirza, RN  Activity Management: bedrest  Sleep/Rest Enhancement: awakenings minimized     Problem: Skin Injury Risk Increased  Goal: Skin Health and Integrity  Outcome: Progressing  Intervention: Optimize Skin Protection  Recent Flowsheet Documentation  Taken 02/07/2022 1600 by Maple Mirza, RN  Head of Bed [facility]) Positioning: HOB at 30 degrees  Taken 02/07/2022 1400 by Maple Mirza, RN  Head of Bed [facility]) Positioning: HOB at 30 degrees  Taken 02/07/2022 1200 by  Maple Mirza, RN  Head of Bed [facility]) Positioning: HOB at 30 degrees  Taken 02/07/2022 1000 by Maple Mirza, RN  Head of Bed [facility]) Positioning: HOB at 30 degrees  Taken 02/07/2022 0800 by Maple Mirza, RN  Pressure Reduction Techniques:   frequent weight shift encouraged   heels elevated off bed   positioned off wounds   pressure points protected   weight shift assistance provided  Head of Bed (HOB) Positioning: HOB at 30 degrees  Pressure Reduction Devices:   heel offloading device utilized   positioning supports utilized   pressure-redistributing mattress utilized   specialty bed utilized  Skin Protection:   adhesive use limited   incontinence pads utilized   pulse oximeter probe site changed   silicone foam dressing in place   skin sealant/moisture barrier applied   skin-to-device areas padded   skin-to-skin areas padded   transparent dressing maintained   tubing/devices free from skin contact     Problem: Self-Care Deficit  Goal: Improved Ability to Complete Activities of Daily Living  Outcome: Progressing  Intervention: Promote Activity and Functional Independence  Recent Flowsheet Documentation  Taken 02/07/2022 0800 by Maple Mirza, RN  Self-Care Promotion: independence encouraged     Problem: Fall Injury Risk  Goal: Absence of Fall and Fall-Related Injury  Outcome: Progressing  Intervention: Identify and Manage Contributors  Recent Flowsheet Documentation  Taken 02/07/2022 0800 by Maple Mirza, RN  Self-Care Promotion: independence encouraged  Intervention: Promote Injury-Free Environment  Recent Flowsheet Documentation  Taken 02/07/2022 0800 by Maple Mirza, RN  Safety Interventions:   aspiration precautions   bed alarm   bleeding precautions   environmental modification   infection management   lighting adjusted for tasks/safety   low bed

## 2022-02-08 NOTE — Unmapped (Signed)
Surgical ICU Progress Note    Consulting attending: Dr. 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     Plan: Transfer to the SICU under Trauma Surgery/Critical Care Service for critical care management.    Overnight events  Albumin 5% and 25% last night    Neuro: Awake, oriented, appropriate. Pain well-controlled on current regimen.  - Tylenol 650 q6h PRN  - Oxy 10mg  q4h PRN   - Robaxin, dilaudid    CV: HDS off pressors  - HR 60 - 77 NSR  -  BP 120/96 - 156/78 - MAPs > 100 overnight  - albumin 5% and 25% overnight 6/29-6/30, responded well  - Lactate cleared at 0.5    Pulm: Extubated overnight   - SpO2 87 - 96% 4L [place]  - q6h ABG,   pH 7.32, pC02 32.7, p02 81, HC03 17, BE -8.3    FEN/GI:  F: MIVF LR 2ml/hr KVO    E: replete electrolytes PRN,   - Na 139, K 4.3, Mg 2.3, Phos 6.8 (up)  N: full liquid diet, no carbonation   - Miralax       s/p DDLT  - 2 drains:   - Lateral (hepatic dome) (550 12hr serosanguinous)   - Medial (porta hepatis) (190 12hr serosanguinous)  - Immunosuppression and anti-rejection meds per SRF   - AST 107 ALT 193 AlkPhos 49      Renal/GU:   UOP 950 mL 24hr, Net +1.9L 24h, Creatinine increase 2.01 > 2.11 (baseline 1.4-1.5)  - low UOP - bolus  - - lasix 60 mg ONCE 6/30  - discontinue foley with TOV  - q6h BMP    Heme: H/H stable. 8.4 > 8.3  - q6h CBC    ID: Afebrile.     Endo: Hyperglycemia, 140s endotool 2-2.6/hr  - Endotool discontinued placed on 12 NPH BID with correctional    MSK: NAI  - PT/OT    Wound: Mercedes incision    Lines, tubes, drains: arterial line (left radial), central venous line (RIJ), Foley, PIVx1,     Daily Care Checklish:            Stress Ulcer Prevention: DVT Prophylaxis:  Holding post transplant            HOB > 30 degrees: yes             Daily Awakening:  Yes           Spontaneous Breathing Trial: yes           Continued Beta Blockade:  no           Continued need for central/PICC line : yes  hemodynamic monitoring and critically ill requiring fluid resuscitation           Continue urinary catheter for: yes  strict intake and output    Dispo: Admit to SICU.     History of Present Illness:     Chief Complaint: Cryptogenic Cirrhosis     Oron Westrup is a 61 y.o. 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 The patient was taken to the  OR on 6/28. Intraoperatively, he required the following resuscitation products: 4u pRBC, 6u FFP, 1u cryoprecipitate, 1u platelets, 500cc albumin, 5L crystalloid. Upon admission to the SICU, the patient was intubated/sedated, on 40mcg/min of norepinephrine, and had an arterial lactate of 5.1.      Allergies:  Venom-honey bee    Home Medications:   Medications Prior to Admission   Medication Sig Dispense Refill Last Dose    acetaminophen (TYLENOL) 325 MG tablet Take 2 tablets (650 mg total) by mouth every six (6) hours as needed.  0 02/05/2022    atorvastatin (LIPITOR) 10 MG tablet Take 1 tablet (10 mg total) by mouth nightly.   02/05/2022    ciprofloxacin HCl (CIPRO) 500 MG tablet TAKE 1 TABLET BY MOUTH IN THE MORNING 30 tablet 0 02/05/2022    furosemide (LASIX) 20 MG tablet Take 2 tablets (40 mg total) by mouth daily. Restart on 6/23 60 tablet 0 02/05/2022    gabapentin (NEURONTIN) 300 MG capsule Take 1 capsule (300 mg total) by mouth at bedtime. 30 capsule 5 02/05/2022    hydrOXYzine (VISTARIL) 50 MG capsule Take 1 capsule (50 mg total) by mouth nightly as needed.   Past Week    insulin syringe-needle U-100 1 mL 31 gauge x 5/16 (8 mm) Syrg Use once as needed for Hydrocortisone injection.. 10 each 0 Past Week    lactulose 10 gram/15 mL solution Take 30 mL (20 g total) by mouth Three (3) times a day. 2700 mL 10 02/05/2022    ondansetron (ZOFRAN-ODT) 4 MG disintegrating tablet Take 1 tablet (4 mg total) by mouth every eight (8) hours as needed for nausea. 72 tablet 11 Past Month    pantoprazole (PROTONIX) 40 MG tablet Take 1 tablet (40 mg total) by mouth daily. 30 tablet 2 02/05/2022    sodium chloride (AYR) 0.65 % Drop 1 spray every four (4) hours as needed.   Past Month    spironolactone (ALDACTONE) 50 MG tablet Take 1 tablet (50 mg total) by mouth daily. Restart 6/23 30 tablet 0 02/05/2022    syringe with needle 3 mL 23 x 1 Syrg Use as needed for Hydrocortisone injection 1 each 0 Past Week    TRESIBA FLEXTOUCH U-100 100 unit/mL (3 mL) InPn Inject 0.3 mL (30 Units total) under the skin at bedtime. Adjust as instructed. (Patient taking differently: Inject 0.4 mL (40 Units total) under the skin daily. Adjust as instructed.) 15 mL 0     zinc sulfate 110 mg (25 mg elemental zinc) Tab Take 1 tablet (110 mg total) by mouth in the morning. 30 tablet 11 02/05/2022    pen needle, diabetic (BD ULTRA-FINE NANO PEN NEEDLE) 32 gauge x 5/32 (4 mm) Ndle ok to sub any brand or size needle preferred by insurance/patient, use 1-2x/day, dx E11.65 100 each 12        Medical History:  Past Medical History:   Diagnosis Date    AKI (acute kidney injury) (CMS-HCC) 12/14/2020    Anxiety 10/22/2013    Arthritis     Cervical radiculopathy 12/03/2016    Chronic pain disorder     Lower back    Cirrhosis (CMS-HCC)     Dental abscess 10/2020    Duodenal ulcer 12/01/2017    GERD (gastroesophageal reflux disease)     History of transfusion     Hyperlipidemia 10/22/2013    Hypertension     under control with meds and weight loss    Hypotension 01/29/2022    Liver disease  Sleep apnea, obstructive     Have machine    Stroke (CMS-HCC)     mild stroke    Type 2 diabetes mellitus with diabetic neuropathy, with long-term current use of insulin (CMS-HCC) 06/09/2014       Surgical History:  Past Surgical History: Procedure Laterality Date    KNEE SURGERY      PR CATH PLACE/CORON ANGIO, IMG SUPER/INTERP,R&L HRT CATH, L HRT VENTRIC N/A 12/19/2020    Procedure: CATH LEFT/RIGHT HEART CATHETERIZATION;  Surgeon: Rosana Hoes, MD;  Location: [facility] CATH;  Service: Cardiology    PR COLONOSCOPY FLX DX W/COLLJ SPEC WHEN PFRMD N/A 12/07/2021    Procedure: COLONOSCOPY, FLEXIBLE, PROXIMAL TO SPLENIC FLEXURE; DIAGNOSTIC, W/WO COLLECTION SPECIMEN BY BRUSH OR WASH;  Surgeon: Annie Paras, MD;  Location: GI [facility]URES [facility];  Service: Gastroenterology    PR TRANSPLANT LIVER,ALLOTRANSPLANT N/A 02/06/2022    Procedure: LIVER ALLOTRANSPLANTATION; ORTHOTOPIC, PARTIAL OR WHOLE, FROM CADAVER OR LIVING DONOR, ANY AGE;  Surgeon: Florene Glen, MD;  Location: MAIN OR [place];  Service: Transplant    PR TRANSPLANT,PREP DONOR LIVER/ARTERIAL N/A 02/06/2022    Procedure: BACKBNCH RECONSTRUCT OF CAD/LIVE DONOR LIVER GFT PRIOR TRANSPLANT; ARTERIAL ANASTAMOSIS, EA;  Surgeon: Florene Glen, MD;  Location: MAIN OR [place];  Service: Transplant    PR UPPER GI ENDOSCOPY,BIOPSY N/A 10/12/2020    Procedure: UGI ENDOSCOPY; WITH BIOPSY, SINGLE OR MULTIPLE;  Surgeon: Marene Lenz, MD;  Location: GI [facility]URES [facility];  Service: Gastroenterology    PR UPPER GI ENDOSCOPY,DIAGNOSIS N/A 01/03/2022    Procedure: UGI ENDO, INCLUDE ESOPHAGUS, STOMACH, & DUODENUM &/OR JEJUNUM; DX W/WO COLLECTION SPECIMN, BY BRUSH OR WASH;  Surgeon: Marene Lenz, MD;  Location: GI [facility]URES [facility];  Service: Gastroenterology    PR UPPER GI ENDOSCOPY,LIGAT VARIX N/A 12/07/2021    Procedure: UGI ENDO; Everlene Balls LIG ESOPH &/OR GASTRIC VARICES;  Surgeon: Annie Paras, MD;  Location: GI PROC[facility]URES [facility];  Service: Gastroenterology    ROOT CANAL      Front teeth       Social History:  Tobacco use:   reports that he has never smoked. He has never used smokeless tobacco.  Alcohol use:   reports that he does not currently use alcohol.  Drug use:  reports that he does not currently use drugs.    Family History:     Family History   Problem Relation Age of Onset    Edema Mother      Alzheimer's disease Father      Aortic dissection Brother      Early death Brother      Aneurysm Brother        Review of Systems:  Review of systems was unobtainable due to patient factors.    Objective  Vitals Reviewed:    Temp:  [36.6 ??C (97.9 ??F)-37.4 ??C (99.3 ??F)] 37.4 ??C (99.3 ??F)  Heart Rate:  [62-100] 62  SpO2 Pulse:  [60-103] 67  Resp:  [8-23] 13  A BP-1: (120-177)/(65-109) 154/66  MAP:  [95 mmHg-131 mmHg] 100 mmHg  FiO2 (%):  [35 %] 35 %  SpO2:  [87 %-100 %] 96 %   Temp (24hrs), Avg:36.9 ??C (98.5 ??F), Min:36.6 ??C (97.9 ??F), Max:37.4 ??C (99.3 ??F)     SpO2: 96 %   Height: 172.7 cm (5' 7.99)    Weight: 98.2 kg (216 lb 7.9 oz)    Body mass index is 32.93 kg/m??.    Body surface area is 2.17  meters squared.     Intake/Output Summary (Last 24 hours) at 02/08/2022 0546  Last data filed at 02/08/2022 0200  Gross per 24 hour   Intake 4071.45 ml   Output 1790 ml   Net 2281.45 ml          I/O last 3 completed shifts:  In: 11074.2 [P.O.:120; I.V.:7839.2; Blood:1600; NG/GT:150; IV Piggyback:1365]  Out: 4820 [Urine:2640; Emesis/NG output:40; Drains:940; Blood:1200]   I/O this shift:  In: 1363.8 [I.V.:800; IV Piggyback:563.8]  Out: 730 [Urine:500; Drains:230]    Continuous Infusions:    lactated Ringers       Tubes and Drains:  Urethral Catheter Non-latex 16 Fr. (Active)   Number of days: 0     Introducer 02/06/22 Internal jugular Right (Active)   Number of days: 0        ETT  8 (Active)   Secured at 25 cm 02/05/22 0006   Placement Assessment Equal Bilateral Breath Sounds;Positive ETCO2 02/05/22 0006   Number of days: 0          Physical Exam:    GEN: Alert awake and oriented x4  CV: regular rate and rhythm, no murmurs/rubs  RESP: CTAB, equal chest rise bilaterally  ABD: Soft, non-distended; dressings over Mercedes incision; 2 right-sided abdominal drains with sanguineous drainage  GU: Foley in place  EXT: Warm, well-perfused, no edema    Labs/Studies: Reviewed in EPIC.    Welford Roche, PGY1

## 2022-02-08 NOTE — Unmapped (Signed)
PHYSICAL THERAPY  Evaluation (02/08/22 1058)          Patient Name:  Matthew Key       Medical Record Number: [id number]   Date of Birth: May 03, 1961  Sex: Male        Treatment Diagnosis: deconditioning, decreased strength     Activity Tolerance: Tolerated treatment well, Limited by pain     ASSESSMENT  Problem List: Impaired balance, Decreased endurance, Decreased mobility, Fall risk, Gait deviation      Assessment : Wren Gallaga is a 61 y.o. 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          Patient presents to acute PT services requiring modA (minAx2) assistance with all OOB functional mobility, able to complete side step at EOB and bed to chair transfer.  Pt is limited in return to PLOF by decreased endurance, standing balance and pain leading to decreased functional mobility.  Patient will benefit from additional skilled acute PT during admission, as well as post discharge recommendations 5x/weekly high intensity in order to improve functional independence. After a review of the personal factors, comorbidities, clinical presentation, and examination of the number of affected body systems, the patient presents as a moderate complexity case.      Today's Interventions: PT eval, bed mobility, transfers ambulation. Pt educated re: PT POC and role, safety with mobility and BLT precautions                     Clinical Decision Making: Moderate      PLAN  Planned Frequency of Treatment:  1-2x per day for: 3-4x week Planned Treatment Duration: 02/22/22     Planned Interventions: Balance activities, Education - Patient, Endurance activities, Home exercise program, Gait training, Functional mobility, Therapeutic exercise, Therapeutic activity     Post-Discharge Physical Therapy Recommendations:  5x weekly, High intensity     PT DME Recommendations: None            Goals: Patient and Family Goals: to get better and out of the bed     Long Term Goal #1: Pt will ambulate > 250' with mod I and LRAD in 4 weeks        SHORT GOAL #1: Pt will complete all bed mobility with SBA               Time Frame : 2 weeks  SHORT GOAL #2: Pt willl complete all functional transfers SBA and LRAD              Time Frame : 2 weeks  SHORT GOAL #3: Pt will ambulate >60' with SBA and LRAD              Time Frame : 2 weeks                                            Prognosis:  Good  Positive Indicators: PLOF, family support,  Barriers to Discharge: Endurance deficits, Gait instability, Impaired Balance, Inability to safely perform ADLS, Pain     SUBJECTIVE  Patient reports: now lets get to the chair  Current Functional Status: pt received supine in bed and left sitting in recliner, all needs met, RN aware and covering RN updated  Prior Functional Status: pt reports mod independent amublation uses a cane at times and in the community at the grocery store uses a motorized scooter  Equipment available at home: Rolling walker, Straight cane, Paediatric nurse with back, Long-Handled Shoehorn, Long-Handled Sponge, Sock aid      Past Medical History:   Diagnosis Date    AKI (acute kidney injury) (CMS-HCC) 12/14/2020    Anxiety 10/22/2013    Arthritis     Cervical radiculopathy 12/03/2016    Chronic pain disorder     Lower back    Cirrhosis (CMS-HCC)     Dental abscess 10/2020    Duodenal ulcer 12/01/2017    GERD (gastroesophageal reflux disease)     History of transfusion     Hyperlipidemia 10/22/2013    Hypertension     under control with meds and weight loss    Hypotension 01/29/2022    Liver disease     Sleep apnea, obstructive     Have machine    Stroke (CMS-HCC)     mild stroke    Type 2 diabetes mellitus with diabetic neuropathy, with long-term current use of insulin (CMS-HCC) 06/09/2014            Social History     Tobacco Use    Smoking status: Never    Smokeless tobacco: Never   Substance Use Topics    Alcohol use: Not Currently       Past Surgical History:   Procedure Laterality Date    KNEE SURGERY      PR CATH PLACE/CORON ANGIO, IMG SUPER/INTERP,R&L HRT CATH, L HRT VENTRIC N/A 12/19/2020    Procedure: CATH LEFT/RIGHT HEART CATHETERIZATION;  Surgeon: Rosana Hoes, MD;  Location: [facility] CATH;  Service: Cardiology    PR COLONOSCOPY FLX DX W/COLLJ SPEC WHEN PFRMD N/A 12/07/2021    Procedure: COLONOSCOPY, FLEXIBLE, PROXIMAL TO SPLENIC FLEXURE; DIAGNOSTIC, W/WO COLLECTION SPECIMEN BY BRUSH OR WASH;  Surgeon: Annie Paras, MD;  Location: [facility];  Service: Gastroenterology    PR TRANSPLANT LIVER,ALLOTRANSPLANT N/A 02/06/2022    Procedure: LIVER ALLOTRANSPLANTATION; ORTHOTOPIC, PARTIAL OR WHOLE, FROM CADAVER OR LIVING DONOR, ANY AGE;  Surgeon: Florene Glen, MD;  Location: MAIN OR [place];  Service: Transplant    PR TRANSPLANT,PREP DONOR LIVER/ARTERIAL N/A 02/06/2022    Procedure: BACKBNCH RECONSTRUCT OF CAD/LIVE DONOR LIVER GFT PRIOR TRANSPLANT; ARTERIAL ANASTAMOSIS, EA;  Surgeon: Florene Glen, MD;  Location: MAIN OR [place];  Service: Transplant    PR UPPER GI ENDOSCOPY,BIOPSY N/A 10/12/2020    Procedure: UGI ENDOSCOPY; WITH BIOPSY, SINGLE OR MULTIPLE;  Surgeon: Marene Lenz, MD;  [facility]: [facility];  Service: Gastroenterology    PR UPPER GI ENDOSCOPY,DIAGNOSIS N/A 01/03/2022    Procedure: UGI ENDO, INCLUDE ESOPHAGUS, STOMACH, & DUODENUM &/OR JEJUNUM; DX W/WO COLLECTION SPECIMN, BY BRUSH OR WASH;  Surgeon: Marene Lenz, MD;  Location: [facility]  Service: Gastroenterology    PR UPPER GI ENDOSCOPY,LIGAT VARIX N/A 12/07/2021    Procedure: UGI ENDO; Everlene Balls LIG ESOPH &/OR GASTRIC VARICES;  Surgeon: Annie Paras, MD;  Location: [facility];  Service: Gastroenterology    ROOT CANAL      Front teeth             Family History   Problem Relation Age of Onset    Edema Mother     Alzheimer's disease Father     Aortic dissection Brother     Early death Brother  Aneurysm Brother         Allergies: Venom-honey bee                  Objective Findings  Precautions / Restrictions  Precautions: Falls precautions, Protective precautions (post-op abdominal precautions)  Weight Bearing Status: Non-applicable  Required Braces or Orthoses: Non-applicable     Communication Preference: Verbal          Pain Comments: pt reports pain at back and abdomen, willing to continnue with PT, RN made aware     Equipment / Environment: JP drain(s), Vascular access (PIV, TLC, Port-a-cath, PICC), Foley, Supplemental oxygen, Telemetry, Arterial line     At Rest: HR 77 spO2 93% (reading on R 2nd digit poor reading) spO2 98% 6L O2 via [place] (reading on L earlobe)  With Activity: sitting in recliner ABP 152/92 (109) HR 68 spO2 96% 6 L O2  Orthostatics: pt denies dizziness at EOB        Living Situation  Living Environment: Apartment  Lives With: Spouse  Home Living: One level home, Level entry, Standard height toilet, Tub/shower unit, Shower chair with back      Cognition: WFL  Cognition comment: answers all questions appropriately AxO x4  Orientation: Oriented x4  Visual/Perception: Wears Glasses/Contacts     Skin Inspection comment: R abdomen JP site drainage noted, RN aware and applies dressing     Upper Extremities  UE ROM: Right WFL, Left WFL  UE Strength: Right WFL, Left WFL    Lower Extremities  LE ROM: Right WFL, Left WFL  LE Strength: Left WFL, Right WFL     Coordination: WFL, Heel shin intact  Proprioception: WFL  Sensation comment: pt reports numbness at RUE new since sugy inform PT/Ot that he told his doctor as well  Balance: Standing balance (needs UE support), Impaired  Balance comment: sitting SBA at EOB, standing modAx2 BL HHA  Posture comment: rounding shoulders      Bed Mobility: Supine to Sit  Bed Mobility: supine > sit EOB with CGA via log roll, VC for log roll techniques to maintain precautions     Transfers: Sit to Stand  Transfer comments: STS from EOB x2 bouts with minAx2      Gait Level of Assistance: Moderate assist, patient does 50-74%  Gait Assistive Device: Other (Comment) (BL HHA)  Gait Distance Ambulated (ft): 4 ft  Gait: pt takes 2 sides steps at EOB minAx2 BL HHA , seated rest break, pt takes ~4 steps to achieve bed > chair transfer minAx2 BL HHA throughout     Stairs: pt reports level entry            Endurance: decreased     Physical Therapy Session Duration  PT Individual [mins]: 15  PT Co-Treatment [mins]: 26 (w/ Neale Burly, OT)  Reason for Co-treatment: To safely progress mobility, Poor activity tolerance, Poor pain control, Requires heavy assist for safety    AM-PAC-5 click  Difficulty turning over In bed?: A lot - Maximum/Moderate Assistance  Difficulty sitting down/standing up from chair with arms? : A lot - Maximum/Moderate Assistance  Difficulty moving from supine to sitting on edge of bed?: A lot - Maximum/Moderate Assistance  Help moving to and from bed from wheelchair?: A lot - Maximum/Moderate Assistance  Help currently needed walking in a hospital room?: A lot - Maximum/Moderate Assistance      Basic Mobility Score:  10    Score (in points): % of Functional Impairment, Limitation, Restriction  5: 100%  impaired, limited, restricted  6-7: At least 80%, but less than 100% impaired, limited restricted  8-11: At least 60%, but less than 80% impaired, limited restricted  12-16: At least 40%, but less than 60% impaired, limited restricted  17-18: At least 20%, but less than 40% impaired, limited restricted  19: At least 1%, but less than 20% impaired, limited restricted  20: 0% impaired, limited restricted       Medical Staff Made Aware: RN Emme aware and updated     I attest that I have reviewed the above information.  Signed: Lunette Stands, PT  Filed 02/08/2022

## 2022-02-08 NOTE — Unmapped (Signed)
Patient currently doing well post extubation on 2lpm [place]. Plan is to monitor respiratory status ongoing.

## 2022-02-08 NOTE — Unmapped (Signed)
Patient alert and oriented. Moving all extremities. Patient educated about use of IS. Patient received one dose of PRN analgesia medication post bath. Patient remains SR overnight without fever, BP WDL. Patient remains on [place] at 2 lpm without desaturation. Patient tolerating liquids with medications this AM. No nausea or emesis. No BM. No new skin issues. Drains and urine output as documented.         Problem: Adult Inpatient Plan of Care  Goal: Plan of Care Review  Outcome: Ongoing - Unchanged  Goal: Patient-Specific Goal (Individualized)  Outcome: Ongoing - Unchanged  Goal: Absence of Hospital-Acquired Illness or Injury  Outcome: Ongoing - Unchanged  Intervention: Identify and Manage Fall Risk  Recent Flowsheet Documentation  Taken 02/07/2022 2000 by [place] R Lusero Nordlund, RN  Safety Interventions:   aspiration precautions   bed alarm   commode/urinal/bedpan at bedside   environmental modification   fall reduction program maintained   low bed   lighting adjusted for tasks/safety   isolation precautions   neutropenic precautions   nonskid shoes/slippers when out of bed   room near unit station   safety attendant  Intervention: Prevent Skin Injury  Recent Flowsheet Documentation  Taken 02/07/2022 2000 by [place] R Cristo Ausburn, RN  Skin Protection:   adhesive use limited   cleansing with dimethicone incontinence wipes   drying agents applied   incontinence pads utilized   silicone foam dressing in place   skin sealant/moisture barrier applied   skin-to-device areas padded   skin-to-skin areas padded   transparent dressing maintained   tubing/devices free from skin contact  Intervention: Prevent and Manage VTE (Venous Thromboembolism) Risk  Recent Flowsheet Documentation  Taken 02/08/2022 0600 by [place] R Alden Bensinger, RN  Activity Management: bedrest  Taken 02/08/2022 0400 by [place] R Marlos Carmen, RN  Activity Management: bedrest  Taken 02/08/2022 0200 by [place] R Araiya Tilmon, RN  Activity Management: bedrest  Taken 02/08/2022 0000 by [place] R Kalil Woessner, RN  Activity Management: bedrest  Taken 02/07/2022 2200 by [place] R Elizandro Laura, RN  Activity Management: bedrest  Taken 02/07/2022 2000 by [place] R Kamrynn Melott, RN  Activity Management:   activity adjusted per tolerance   bedrest  VTE Prevention/Management: anticoagulant therapy  Range of Motion: Bilateral Upper and Lower Extremities  Intervention: Prevent Infection  Recent Flowsheet Documentation  Taken 02/07/2022 2000 by [place] R Efraim Vanallen, RN  Infection Prevention:   cohorting utilized   environmental surveillance performed   equipment surfaces disinfected   hand hygiene promoted   personal protective equipment utilized   rest/sleep promoted   single patient room provided  Goal: Optimal Comfort and Wellbeing  Outcome: Ongoing - Unchanged  Goal: Readiness for Transition of Care  Outcome: Ongoing - Unchanged  Goal: Rounds/Family Conference  Outcome: Ongoing - Unchanged     Problem: Infection  Goal: Absence of Infection Signs and Symptoms  Outcome: Ongoing - Unchanged  Intervention: Prevent or Manage Infection  Recent Flowsheet Documentation  Taken 02/07/2022 2000 by [place] R Alletta Mattos, RN  Infection Management: aseptic technique maintained  Isolation Precautions: protective precautions maintained     Problem: Impaired Wound Healing  Goal: Optimal Wound Healing  Outcome: Ongoing - Unchanged  Intervention: Promote Wound Healing  Recent Flowsheet Documentation  Taken 02/08/2022 0600 by [place] R Junita Kubota, RN  Activity Management: bedrest  Taken 02/08/2022 0400 by [place] R Corinthian Kemler, RN  Activity Management: bedrest  Taken 02/08/2022 0200 by [place] R Sharmain Lastra, RN  Activity Management: bedrest  Taken 02/08/2022 0000 by [place] R Zala Degrasse, RN  Activity Management: bedrest  Taken 02/07/2022 2200 by [place] R Ever Gustafson, RN  Activity Management: bedrest  Taken 02/07/2022 2000 by [place] R Ziggy Chanthavong, RN  Activity Management:   activity adjusted per tolerance   bedrest     Problem: Skin Injury Risk Increased  Goal: Skin Health and Integrity  Outcome: Ongoing - Unchanged  Intervention: Optimize Skin Protection  Recent Flowsheet Documentation  Taken 02/08/2022 0600 by [place] R Destine Ambroise, RN  Head of Bed [facility]) Positioning: HOB at 30 degrees  Taken 02/08/2022 0400 by [place] R Brodey Bonn, RN  Head of Bed [facility]) Positioning: HOB at 30 degrees  Taken 02/08/2022 0200 by [place] R Maison Agrusa, RN  Head of Bed [facility]) Positioning: HOB at 30 degrees  Taken 02/08/2022 0000 by [place] R Sharay Bellissimo, RN  Head of Bed [facility]) Positioning: HOB at 30 degrees  Taken 02/07/2022 2200 by [place] R Nidal Rivet, RN  Head of Bed [facility]) Positioning: HOB at 30 degrees  Taken 02/07/2022 2000 by [place] R Aryahna Spagna, RN  Pressure Reduction Techniques:   heels elevated off bed   frequent weight shift encouraged  Head of Bed (HOB) Positioning: HOB at 30 degrees  Pressure Reduction Devices:   foam padding utilized   heel offloading device utilized   positioning supports utilized   pressure-redistributing mattress utilized   specialty bed utilized  Skin Protection:   adhesive use limited   cleansing with dimethicone incontinence wipes   drying agents applied   incontinence pads utilized   silicone foam dressing in place   skin sealant/moisture barrier applied   skin-to-device areas padded   skin-to-skin areas padded   transparent dressing maintained   tubing/devices free from skin contact     Problem: Self-Care Deficit  Goal: Improved Ability to Complete Activities of Daily Living  Outcome: Ongoing - Unchanged     Problem: Fall Injury Risk  Goal: Absence of Fall and Fall-Related Injury  Outcome: Ongoing - Unchanged  Intervention: Promote Injury-Free Environment  Recent Flowsheet Documentation  Taken 02/07/2022 2000 by [place] R Dauna Ziska, RN  Safety Interventions:   aspiration precautions   bed alarm   commode/urinal/bedpan at bedside   environmental modification   fall reduction program maintained   low bed   lighting adjusted for tasks/safety   isolation precautions   neutropenic precautions   nonskid shoes/slippers when out of bed   room near unit station   safety attendant

## 2022-02-09 LAB — HEPATIC FUNCTION PANEL
ALBUMIN: 4.3 g/dL (ref 3.4–5.0)
ALBUMIN: 4.3 g/dL (ref 3.4–5.0)
ALKALINE PHOSPHATASE: 51 U/L (ref 46–116)
ALKALINE PHOSPHATASE: 59 U/L (ref 46–116)
ALT (SGPT): 146 U/L — ABNORMAL HIGH (ref 10–49)
ALT (SGPT): 154 U/L — ABNORMAL HIGH (ref 10–49)
AST (SGOT): 71 U/L — ABNORMAL HIGH (ref <=34)
AST (SGOT): 77 U/L — ABNORMAL HIGH (ref <=34)
BILIRUBIN DIRECT: 0.7 mg/dL — ABNORMAL HIGH (ref 0.00–0.30)
BILIRUBIN DIRECT: 0.6 mg/dL — ABNORMAL HIGH (ref 0.00–0.30)
BILIRUBIN TOTAL: 1 mg/dL (ref 0.3–1.2)
BILIRUBIN TOTAL: 0.9 mg/dL (ref 0.3–1.2)
PROTEIN TOTAL: 5.6 g/dL — ABNORMAL LOW (ref 5.7–8.2)
PROTEIN TOTAL: 5.7 g/dL (ref 5.7–8.2)

## 2022-02-09 LAB — PHOSPHORUS
PHOSPHORUS: 6.1 mg/dL — ABNORMAL HIGH (ref 2.4–5.1)
PHOSPHORUS: 4.9 mg/dL (ref 2.4–5.1)

## 2022-02-09 LAB — CBC
HEMATOCRIT: 23.9 % — ABNORMAL LOW (ref 39.0–48.0)
HEMATOCRIT: 24.1 % — ABNORMAL LOW (ref 39.0–48.0)
HEMOGLOBIN: 8.3 g/dL — ABNORMAL LOW (ref 12.9–16.5)
HEMOGLOBIN: 8.6 g/dL — ABNORMAL LOW (ref 12.9–16.5)
MEAN CORPUSCULAR HEMOGLOBIN CONC: 34.7 g/dL (ref 32.0–36.0)
MEAN CORPUSCULAR HEMOGLOBIN CONC: 35.5 g/dL (ref 32.0–36.0)
MEAN CORPUSCULAR HEMOGLOBIN: 33.3 pg — ABNORMAL HIGH (ref 25.9–32.4)
MEAN CORPUSCULAR HEMOGLOBIN: 33.9 pg — ABNORMAL HIGH (ref 25.9–32.4)
MEAN CORPUSCULAR VOLUME: 96.1 fL — ABNORMAL HIGH (ref 77.6–95.7)
MEAN CORPUSCULAR VOLUME: 95.5 fL (ref 77.6–95.7)
MEAN PLATELET VOLUME: 10.8 fL — ABNORMAL HIGH (ref 6.8–10.7)
MEAN PLATELET VOLUME: 11.1 fL — ABNORMAL HIGH (ref 6.8–10.7)
PLATELET COUNT: 33 10*9/L — ABNORMAL LOW (ref 150–450)
PLATELET COUNT: 27 10*9/L — ABNORMAL LOW (ref 150–450)
RED BLOOD CELL COUNT: 2.49 10*12/L — ABNORMAL LOW (ref 4.26–5.60)
RED BLOOD CELL COUNT: 2.52 10*12/L — ABNORMAL LOW (ref 4.26–5.60)
RED CELL DISTRIBUTION WIDTH: 18.4 % — ABNORMAL HIGH (ref 12.2–15.2)
RED CELL DISTRIBUTION WIDTH: 18.5 % — ABNORMAL HIGH (ref 12.2–15.2)
WBC ADJUSTED: 6.3 10*9/L (ref 3.6–11.2)
WBC ADJUSTED: 5.8 10*9/L (ref 3.6–11.2)

## 2022-02-09 LAB — BLOOD GAS CRITICAL CARE PANEL, ARTERIAL
BASE EXCESS ARTERIAL: -5.5 — ABNORMAL LOW (ref -2.0–2.0)
BASE EXCESS ARTERIAL: -3.9 — ABNORMAL LOW (ref -2.0–2.0)
CALCIUM IONIZED ARTERIAL (MG/DL): 5.12 mg/dL (ref 4.40–5.40)
CALCIUM IONIZED ARTERIAL (MG/DL): 4.9 mg/dL (ref 4.40–5.40)
GLUCOSE WHOLE BLOOD: 151 mg/dL (ref 70–179)
GLUCOSE WHOLE BLOOD: 257 mg/dL — ABNORMAL HIGH (ref 70–179)
HCO3 ARTERIAL: 20 mmol/L — ABNORMAL LOW (ref 22–27)
HCO3 ARTERIAL: 22 mmol/L (ref 22–27)
HEMOGLOBIN BLOOD GAS: 8.1 g/dL — ABNORMAL LOW
HEMOGLOBIN BLOOD GAS: 8.7 g/dL — ABNORMAL LOW (ref 13.50–17.50)
LACTATE BLOOD ARTERIAL: 0.7 mmol/L (ref <1.3)
LACTATE BLOOD ARTERIAL: 1 mmol/L (ref <1.3)
O2 SATURATION ARTERIAL: 99.1 % (ref 94.0–100.0)
O2 SATURATION ARTERIAL: 98 % (ref 94.0–100.0)
PCO2 ARTERIAL: 45.2 mmHg — ABNORMAL HIGH (ref 35.0–45.0)
PCO2 ARTERIAL: 40.1 mmHg (ref 35.0–45.0)
PH ARTERIAL: 7.27 — ABNORMAL LOW (ref 7.35–7.45)
PH ARTERIAL: 7.34 — ABNORMAL LOW (ref 7.35–7.45)
PO2 ARTERIAL: 80.4 mmHg (ref 80.0–110.0)
PO2 ARTERIAL: 93.4 mmHg (ref 80.0–110.0)
POTASSIUM WHOLE BLOOD: 4.5 mmol/L (ref 3.4–4.6)
POTASSIUM WHOLE BLOOD: 4.2 mmol/L (ref 3.4–4.6)
SODIUM WHOLE BLOOD: 137 mmol/L (ref 135–145)
SODIUM WHOLE BLOOD: 136 mmol/L (ref 135–145)

## 2022-02-09 LAB — MAGNESIUM
MAGNESIUM: 2.2 mg/dL (ref 1.6–2.6)
MAGNESIUM: 2 mg/dL (ref 1.6–2.6)

## 2022-02-09 LAB — BASIC METABOLIC PANEL
ANION GAP: 10 mmol/L (ref 5–14)
ANION GAP: 8 mmol/L (ref 5–14)
BLOOD UREA NITROGEN: 44 mg/dL — ABNORMAL HIGH (ref 9–23)
BLOOD UREA NITROGEN: 45 mg/dL — ABNORMAL HIGH (ref 9–23)
BUN / CREAT RATIO: 19
BUN / CREAT RATIO: 19
CALCIUM: 8.2 mg/dL — ABNORMAL LOW (ref 8.7–10.4)
CALCIUM: 8.6 mg/dL — ABNORMAL LOW (ref 8.7–10.4)
CHLORIDE: 108 mmol/L — ABNORMAL HIGH (ref 98–107)
CHLORIDE: 106 mmol/L (ref 98–107)
CO2: 21 mmol/L (ref 20.0–31.0)
CO2: 22 mmol/L (ref 20.0–31.0)
CREATININE: 2.34 mg/dL — ABNORMAL HIGH
CREATININE: 2.33 mg/dL — ABNORMAL HIGH
EGFR CKD-EPI (2021) MALE: 31 mL/min/{1.73_m2} — ABNORMAL LOW (ref >=60)
EGFR CKD-EPI (2021) MALE: 31 mL/min/{1.73_m2} — ABNORMAL LOW (ref >=60)
GLUCOSE RANDOM: 154 mg/dL (ref 70–179)
GLUCOSE RANDOM: 269 mg/dL — ABNORMAL HIGH (ref 70–179)
POTASSIUM: 4.4 mmol/L (ref 3.4–4.8)
POTASSIUM: 4.3 mmol/L (ref 3.4–4.8)
SODIUM: 139 mmol/L (ref 135–145)
SODIUM: 136 mmol/L (ref 135–145)

## 2022-02-09 LAB — TACROLIMUS LEVEL, TROUGH: TACROLIMUS, TROUGH: 3.8 ng/mL — ABNORMAL LOW (ref 5.0–15.0)

## 2022-02-09 MED ADMIN — piperacillin-tazobactam (ZOSYN) 3.375 g in sodium chloride 0.9 % (NS) 100 mL IVPB-MBP: 3.375 g | INTRAVENOUS | @ 13:00:00 | Stop: 2022-02-11

## 2022-02-09 MED ADMIN — hydrALAZINE (APRESOLINE) injection 10 mg: 10 mg | INTRAVENOUS | @ 03:00:00

## 2022-02-09 MED ADMIN — gabapentin (NEURONTIN) capsule 300 mg: 300 mg | ORAL

## 2022-02-09 MED ADMIN — piperacillin-tazobactam (ZOSYN) 3.375 g in sodium chloride 0.9 % (NS) 100 mL IVPB-MBP: 3.375 g | INTRAVENOUS | @ 20:00:00 | Stop: 2022-02-11

## 2022-02-09 MED ADMIN — furosemide (LASIX) injection 60 mg: 60 mg | INTRAVENOUS | @ 16:00:00 | Stop: 2022-02-09

## 2022-02-09 MED ADMIN — methocarbamoL (ROBAXIN) tablet 500 mg: 500 mg | ORAL | @ 10:00:00

## 2022-02-09 MED ADMIN — insulin NPH (HumuLIN,NovoLIN) injection 12 Units: 12 [IU] | SUBCUTANEOUS | @ 01:00:00

## 2022-02-09 MED ADMIN — oxyCODONE (ROXICODONE) immediate release tablet 10 mg: 10 mg | ORAL | @ 17:00:00 | Stop: 2022-02-20

## 2022-02-09 MED ADMIN — insulin regular (HumuLIN,NovoLIN) injection 0-20 Units: 0-20 [IU] | SUBCUTANEOUS | @ 22:00:00

## 2022-02-09 MED ADMIN — methocarbamoL (ROBAXIN) tablet 500 mg: 500 mg | ORAL | @ 21:00:00

## 2022-02-09 MED ADMIN — mycophenolate (CELLCEPT) tablet 1,000 mg: 1000 mg | ORAL

## 2022-02-09 MED ADMIN — tacrolimus (PROGRAF) capsule 4 mg: 4 mg | ORAL | @ 21:00:00

## 2022-02-09 MED ADMIN — polyethylene glycol (MIRALAX) packet 17 g: 17 g | ORAL | @ 13:00:00

## 2022-02-09 MED ADMIN — nystatin (MYCOSTATIN) oral suspension: 10 mL | ORAL | @ 03:00:00

## 2022-02-09 MED ADMIN — nystatin (MYCOSTATIN) oral suspension: 10 mL | ORAL | @ 20:00:00

## 2022-02-09 MED ADMIN — insulin regular (HumuLIN,NovoLIN) injection 0-20 Units: 0-20 [IU] | SUBCUTANEOUS | @ 03:00:00

## 2022-02-09 MED ADMIN — piperacillin-tazobactam (ZOSYN) 3.375 g in sodium chloride 0.9 % (NS) 100 mL IVPB-MBP: 3.375 g | INTRAVENOUS | @ 03:00:00 | Stop: 2022-02-11

## 2022-02-09 MED ADMIN — methylPREDNISolone sodium succinate (PF) (SOLU-medrol) injection 50 mg: 50 mg | INTRAVENOUS | Stop: 2022-02-08

## 2022-02-09 MED ADMIN — pantoprazole (PROTONIX) EC tablet 40 mg: 40 mg | ORAL | @ 13:00:00

## 2022-02-09 MED ADMIN — tacrolimus (PROGRAF) capsule 4 mg: 4 mg | ORAL | @ 10:00:00

## 2022-02-09 MED ADMIN — HYDROmorphone (PF) (DILAUDID) injection 1 mg: 1 mg | INTRAVENOUS | @ 03:00:00 | Stop: 2022-02-21

## 2022-02-09 MED ADMIN — mycophenolate (CELLCEPT) tablet 1,000 mg: 1000 mg | ORAL | @ 14:00:00

## 2022-02-09 MED ADMIN — HYDROmorphone (PF) (DILAUDID) injection 1 mg: 1 mg | INTRAVENOUS | @ 09:00:00 | Stop: 2022-02-21

## 2022-02-09 MED ADMIN — methocarbamoL (ROBAXIN) tablet 500 mg: 500 mg | ORAL

## 2022-02-09 MED ADMIN — valGANciclovir (VALCYTE) tablet 450 mg: 450 mg | ORAL | @ 13:00:00 | Stop: 2022-02-09

## 2022-02-09 MED ADMIN — nystatin (MYCOSTATIN) oral suspension: 10 mL | ORAL | @ 10:00:00

## 2022-02-09 MED ADMIN — methocarbamoL (ROBAXIN) tablet 500 mg: 500 mg | ORAL | @ 16:00:00

## 2022-02-09 MED ADMIN — insulin regular (HumuLIN,NovoLIN) injection 0-20 Units: 0-20 [IU] | SUBCUTANEOUS | @ 16:00:00

## 2022-02-09 MED ADMIN — oxyCODONE (ROXICODONE) immediate release tablet 10 mg: 10 mg | ORAL | Stop: 2022-02-20

## 2022-02-09 MED ADMIN — hydrALAZINE (APRESOLINE) injection 10 mg: 10 mg | INTRAVENOUS | @ 18:00:00

## 2022-02-09 MED ADMIN — insulin NPH (HumuLIN,NovoLIN) injection 12 Units: 12 [IU] | SUBCUTANEOUS | @ 15:00:00

## 2022-02-09 MED ADMIN — oxyCODONE (ROXICODONE) immediate release tablet 10 mg: 10 mg | ORAL | @ 05:00:00 | Stop: 2022-02-20

## 2022-02-09 MED ADMIN — oxyCODONE (ROXICODONE) immediate release tablet 10 mg: 10 mg | ORAL | @ 21:00:00 | Stop: 2022-02-20

## 2022-02-09 MED ADMIN — albumin human 25 % bottle 25 g: 25 g | INTRAVENOUS | @ 10:00:00 | Stop: 2022-02-09

## 2022-02-09 MED ADMIN — methylPREDNISolone sodium succinate (PF) (Solu-MEDROL) injection 40 mg: 40 mg | INTRAVENOUS | @ 13:00:00 | Stop: 2022-02-09

## 2022-02-09 MED ADMIN — piperacillin-tazobactam (ZOSYN) 3.375 g in sodium chloride 0.9 % (NS) 100 mL IVPB-MBP: 3.375 g | INTRAVENOUS | @ 09:00:00 | Stop: 2022-02-11

## 2022-02-09 NOTE — Unmapped (Signed)
Pt arrived to ISCU as an SICU downgrade s/p liver tx. AxO x4, VSS, afebrile upon admission. Maintaining O2 on 4L [place]. No desats observed. TOV continued. Pt had void occurrence with PVR> 300 ml. I/O cath performed. Day shift nurse to continue TOV. Jpx2 stripped per order. Large serosanguinous output noted. PRN oxycodone given with relief. Plan of care reviewed and all current questions answered. Remains intermediate status.   Problem: Adult Inpatient Plan of Care  Goal: Plan of Care Review  Outcome: Ongoing - Unchanged  Goal: Patient-Specific Goal (Individualized)  Outcome: Ongoing - Unchanged  Goal: Absence of Hospital-Acquired Illness or Injury  Outcome: Ongoing - Unchanged  Intervention: Identify and Manage Fall Risk  Recent Flowsheet Documentation  Taken 02/09/2022 0117 by Huel Coventry, RN  Safety Interventions:   aspiration precautions   bed alarm   lighting adjusted for tasks/safety   low bed  Intervention: Prevent Skin Injury  Recent Flowsheet Documentation  Taken 02/09/2022 0117 by Huel Coventry, RN  Skin Protection:   adhesive use limited   incontinence pads utilized   silicone foam dressing in place   skin-to-skin areas padded   skin-to-device areas padded  Intervention: Prevent and Manage VTE (Venous Thromboembolism) Risk  Recent Flowsheet Documentation  Taken 02/09/2022 0400 by Huel Coventry, RN  Activity Management: activity adjusted per tolerance  Taken 02/09/2022 0200 by Huel Coventry, RN  Activity Management: activity adjusted per tolerance  Taken 02/09/2022 0117 by Huel Coventry, RN  Activity Management: activity adjusted per tolerance  Intervention: Prevent Infection  Recent Flowsheet Documentation  Taken 02/09/2022 0117 by Huel Coventry, RN  Infection Prevention:   rest/sleep promoted   visitors restricted/screened   single patient room provided  Goal: Optimal Comfort and Wellbeing  Outcome: Ongoing - Unchanged  Goal: Readiness for Transition of Care  Outcome: Ongoing - Unchanged  Goal: Rounds/Family Conference  Outcome: Ongoing - Unchanged     Problem: Infection  Goal: Absence of Infection Signs and Symptoms  Outcome: Ongoing - Unchanged  Intervention: Prevent or Manage Infection  Recent Flowsheet Documentation  Taken 02/09/2022 0117 by Huel Coventry, RN  Infection Management: aseptic technique maintained  Isolation Precautions: enteric precautions maintained     Problem: Impaired Wound Healing  Goal: Optimal Wound Healing  Outcome: Ongoing - Unchanged  Intervention: Promote Wound Healing  Recent Flowsheet Documentation  Taken 02/09/2022 0400 by Huel Coventry, RN  Activity Management: activity adjusted per tolerance  Taken 02/09/2022 0200 by Huel Coventry, RN  Activity Management: activity adjusted per tolerance  Taken 02/09/2022 0117 by Huel Coventry, RN  Activity Management: activity adjusted per tolerance     Problem: Skin Injury Risk Increased  Goal: Skin Health and Integrity  Outcome: Ongoing - Unchanged  Intervention: Optimize Skin Protection  Recent Flowsheet Documentation  Taken 02/09/2022 0117 by Huel Coventry, RN  Pressure Reduction Techniques:   frequent weight shift encouraged   heels elevated off bed   positioned off wounds   pressure points protected   weight shift assistance provided  Head of Bed (HOB) Positioning: HOB at 30-45 degrees  Pressure Reduction Devices: pressure-redistributing mattress utilized  Skin Protection:   adhesive use limited   incontinence pads utilized   silicone foam dressing in place   skin-to-skin areas padded   skin-to-device areas padded     Problem: Self-Care Deficit  Goal: Improved Ability to Complete Activities of Daily Living  Outcome: Ongoing -  Unchanged     Problem: Fall Injury Risk  Goal: Absence of Fall and Fall-Related Injury  Outcome: Ongoing - Unchanged  Intervention: Promote Injury-Free Environment  Recent Flowsheet Documentation  Taken 02/09/2022 0117 by Huel Coventry, RN  Safety Interventions:   aspiration precautions   bed alarm   lighting adjusted for tasks/safety   low bed

## 2022-02-09 NOTE — Unmapped (Signed)
Pt is A&Ox4, able to move all extremities. 6L  IS use encouraged. NSR on monitor, PRN hydralazine given for bp >180. Palpable pulses in all extremities. Pt has had no BM, trail of void restarted, next due to void at 0400. Pt jp drains to bulb suction. Midline incision is CDI. Report called and given to ISCU RN, Pt transferred with RN to ISCU room 6418.     Problem: Adult Inpatient Plan of Care  Goal: Plan of Care Review  Outcome: Progressing  Goal: Patient-Specific Goal (Individualized)  Outcome: Progressing  Goal: Absence of Hospital-Acquired Illness or Injury  Outcome: Progressing  Goal: Optimal Comfort and Wellbeing  Outcome: Progressing  Goal: Readiness for Transition of Care  Outcome: Progressing  Goal: Rounds/Family Conference  Outcome: Progressing     Problem: Infection  Goal: Absence of Infection Signs and Symptoms  Outcome: Progressing     Problem: Impaired Wound Healing  Goal: Optimal Wound Healing  Outcome: Progressing     Problem: Skin Injury Risk Increased  Goal: Skin Health and Integrity  Outcome: Progressing  Intervention: Optimize Skin Protection  Recent Flowsheet Documentation  Taken 02/08/2022 2000 by Ovidio Hanger, RN  Head of Bed [facility]) Positioning: HOB at 30-45 degrees     Problem: Self-Care Deficit  Goal: Improved Ability to Complete Activities of Daily Living  Outcome: Progressing     Problem: Fall Injury Risk  Goal: Absence of Fall and Fall-Related Injury  Outcome: Progressing

## 2022-02-09 NOTE — Unmapped (Signed)
Shift Summary:  Patient alert and oriented x4, patient ambulated up to chair. Remains on nasal cannula. NSR, NT to HT, hydralazine given x1 for systolic >180. Started on full liquid diet. Voids in urinal. Afebrile. For further information see documentation.  Problem: Adult Inpatient Plan of Care  Goal: Plan of Care Review  02/08/2022 1839 by Farrel Conners, RN  Outcome: Progressing  02/08/2022 1746 by Farrel Conners, RN  Outcome: Progressing  Goal: Patient-Specific Goal (Individualized)  02/08/2022 1839 by Farrel Conners, RN  Outcome: Progressing  02/08/2022 1746 by Farrel Conners, RN  Outcome: Progressing  Goal: Absence of Hospital-Acquired Illness or Injury  02/08/2022 1839 by Farrel Conners, RN  Outcome: Progressing  02/08/2022 1746 by Farrel Conners, RN  Outcome: Progressing  Intervention: Identify and Manage Fall Risk  Recent Flowsheet Documentation  Taken 02/08/2022 0800 by Farrel Conners, RN  Safety Interventions:   aspiration precautions   bed alarm   bleeding precautions   environmental modification   fall reduction program maintained   lighting adjusted for tasks/safety   low bed  Intervention: Prevent Skin Injury  Recent Flowsheet Documentation  Taken 02/08/2022 0800 by Farrel Conners, RN  Skin Protection:   cleansing with dimethicone incontinence wipes   incontinence pads utilized   silicone foam dressing in place   tubing/devices free from skin contact   pulse oximeter probe site changed  Intervention: Prevent and Manage VTE (Venous Thromboembolism) Risk  Recent Flowsheet Documentation  Taken 02/08/2022 0800 by Farrel Conners, RN  Activity Management: bedrest  Intervention: Prevent Infection  Recent Flowsheet Documentation  Taken 02/08/2022 0800 by Farrel Conners, RN  Infection Prevention:   cohorting utilized   environmental surveillance performed   equipment surfaces disinfected   hand hygiene promoted   personal protective equipment utilized   rest/sleep promoted   visitors restricted/screened   single patient room provided  Goal: Optimal Comfort and Wellbeing  02/08/2022 1839 by Farrel Conners, RN  Outcome: Progressing  02/08/2022 1746 by Farrel Conners, RN  Outcome: Progressing  Goal: Readiness for Transition of Care  02/08/2022 1839 by Farrel Conners, RN  Outcome: Progressing  02/08/2022 1746 by Farrel Conners, RN  Outcome: Progressing  Goal: Rounds/Family Conference  02/08/2022 1839 by Farrel Conners, RN  Outcome: Progressing  02/08/2022 1746 by Farrel Conners, RN  Outcome: Progressing     Problem: Infection  Goal: Absence of Infection Signs and Symptoms  02/08/2022 1839 by Farrel Conners, RN  Outcome: Progressing  02/08/2022 1746 by Farrel Conners, RN  Outcome: Progressing  Intervention: Prevent or Manage Infection  Recent Flowsheet Documentation  Taken 02/08/2022 0800 by Farrel Conners, RN  Infection Management: aseptic technique maintained     Problem: Impaired Wound Healing  Goal: Optimal Wound Healing  02/08/2022 1839 by Farrel Conners, RN  Outcome: Progressing  02/08/2022 1746 by Farrel Conners, RN  Outcome: Progressing  Intervention: Promote Wound Healing  Recent Flowsheet Documentation  Taken 02/08/2022 0800 by Farrel Conners, RN  Activity Management: bedrest     Problem: Skin Injury Risk Increased  Goal: Skin Health and Integrity  02/08/2022 1839 by Farrel Conners, RN  Outcome: Progressing  02/08/2022 1746 by Farrel Conners, RN  Outcome: Progressing  Intervention: Optimize Skin Protection  Recent Flowsheet Documentation  Taken 02/08/2022 1800 by Farrel Conners, RN  Head of Bed [facility]) Positioning: HOB at 30 degrees  Taken 02/08/2022 1600 by Farrel Conners, RN  Head of Bed [facility]) Positioning: HOB at 30 degrees  Taken 02/08/2022 1400 by Farrel Conners, RN  Head of Bed [facility]) Positioning:  HOB at 30 degrees  Taken 02/08/2022 1200 by Farrel Conners, RN  Head of Bed [facility]) Positioning: HOB at 30 degrees  Taken 02/08/2022 1000 by Farrel Conners, RN  Head of Bed [facility]) Positioning: HOB at 30 degrees  Taken 02/08/2022 0800 by Farrel Conners, RN  Pressure Reduction Techniques:   frequent weight shift encouraged   heels elevated off bed   positioned off wounds  Head of Bed (HOB) Positioning: HOB at 30 degrees  Pressure Reduction Devices:   heel offloading device utilized   positioning supports utilized   pressure-redistributing mattress utilized  Skin Protection:   cleansing with dimethicone incontinence wipes   incontinence pads utilized   silicone foam dressing in place   tubing/devices free from skin contact   pulse oximeter probe site changed     Problem: Self-Care Deficit  Goal: Improved Ability to Complete Activities of Daily Living  02/08/2022 1839 by Farrel Conners, RN  Outcome: Progressing  02/08/2022 1746 by Farrel Conners, RN  Outcome: Progressing     Problem: Fall Injury Risk  Goal: Absence of Fall and Fall-Related Injury  02/08/2022 1839 by Farrel Conners, RN  Outcome: Progressing  02/08/2022 1746 by Farrel Conners, RN  Outcome: Progressing  Intervention: Promote Injury-Free Environment  Recent Flowsheet Documentation  Taken 02/08/2022 0800 by Farrel Conners, RN  Safety Interventions:   aspiration precautions   bed alarm   bleeding precautions   environmental modification   fall reduction program maintained   lighting adjusted for tasks/safety   low bed

## 2022-02-09 NOTE — Unmapped (Signed)
Transplant Surgery Progress Note    Date of service: 02/09/2022    Hospital Day:  LOS: 3 days   Surgery Date(s): 02/06/22    Interval History:   NAONE. Transferred to stepdown. Foley catheter removed, failed TOV x3.  Medial drain 560 (from 190), lateral drain 845 (from 550).     Assessment/Plan:  Matthew Key is a 61 y.o. 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 Plts 33 from 18.  - q12 CBC    Induction immunosuppression: methylpred and basiliximab  Maintenance immunosuppression: MMF and tac    ID:  - Zosyn x5 days    Dispo: SDS    Objective:    Physical Exam:    General: NAD  HEENT: Nasal cannula  CV: Regular rate per telemetry  Resp: NWOB on 2L [place]  Abdomen: Soft, mildly distended, appropriately tender, surgical drains w/ serosanguionous output x2. Mercedes incision clean and intact, small amount of serosanguinous drainage on dressings  Extremities: WWP    Data Review:   I have reviewed the labs and studies from the last 24 hours.    Vitals Reviewed:    Temp:  [36.4 ??C (97.5 ??F)-36.7 ??C (98.1 ??F)] 36.4 ??C (97.5 ??F)  Heart Rate:  [63-88] 82  SpO2 Pulse:  [63-89] 89  Resp:  [8-21] 11  BP: (134-161)/(78-83) 134/83  MAP (mmHg):  [98-104] 98  A BP-1: (154-190)/(70-89) 166/80  MAP:  [101 mmHg-128 mmHg] 111 mmHg  SpO2: [90 %-99 %] 90 %   Temp (24hrs), Avg:36.5 ??C (97.7 ??F), Min:36.4 ??C (97.5 ??F), Max:36.7 ??C (98.1 ??F)     SpO2: 90 %   Height: 172.7 cm (5' 7.99)    Weight: (!) 105 kg (231 lb 7.7 oz)    Body mass index is 35.21 kg/m??.    Body surface area is 2.24 meters squared.       Intake/Output Summary (Last 24 hours) at 02/09/2022 0952  Last data filed at 02/09/2022 0800  Gross per 24 hour   Intake 1955 ml   Output 3370 ml   Net -1415 ml        I/O last 3 completed shifts:  In: 4018.8 [P.O.:1455; I.V.:800; IV Piggyback:1763.8]  Out: 4240 [Urine:2605; Drains:1635]   I/O this shift:  In: -   Out: 160 [Drains:160]      Continuous Infusions:         Hemodynamic/Invasive Device Data (24 hrs):  A BP-1: (154-190)/(70-89) 166/80  MAP:  [101 mmHg-128 mmHg] 111 mmHg            Ventilation/Oxygen  Therapy (24hrs):  O2 Flow Rate (L/min):  [2 L/min-6 L/min] 2 L/min    Tubes and Drains:  Patient Lines/Drains/Airways Status     Active Active Lines, Drains, & Airways     Name Placement date Placement time Site Days    Introducer 02/06/22 Internal jugular Right 02/06/22  0410  Internal jugular  3    CVC MAC Introducer 02/06/22 Right Internal jugular 02/06/22  1000  Internal jugular  2    Closed/Suction Drain 1 Anterior RLQ Bulb 10 Fr. 02/06/22  1052  RLQ  2    Closed/Suction Drain 2 Anterior;Lateral RLQ Bulb 10 Fr. 02/06/22  1150  RLQ  2    Peripheral IV 02/05/22 Anterior;Left Forearm 02/05/22  1726  Forearm  3    Arterial Line 02/06/22 Left Radial 02/06/22  0426  Radial  3                ------------------------------  Leilani Merl, MD  General Surgery PGY3

## 2022-02-09 NOTE — Unmapped (Signed)
Pt is on 2L/[place].  BS are clear and diminished  Pt wears a Cpap at home but states he does not want to wear one of ours, he has tried and doesn't like it  No signs of respiratory distress

## 2022-02-09 NOTE — Unmapped (Signed)
""  VENOUS ACCESS TEAM PROCEDURE    Order was placed for a \""PIV by Venous Access Team (VAT)\"".  Patient was assessed at bedside for placement of a PIV. PPE were donned per protocol.  Access was obtained. Blood return noted.  Dressing intact and device well secured.  Flushed with normal saline.  See LDA for details.  Pt advised to inform RN of any s/s of discomfort at the PIV site.    Workup / Procedure Time:  30 minutes      RN was notified.       Thank you,     Bernardo Brayman RN Venous Access Team""

## 2022-02-09 NOTE — Unmapped (Signed)
Pt remains alert and oriented  X 4. He was OOB in the recliner today. Advanced to a regular diet and eating 100% of meals so far. He is uses his incentive spirometer and oxygen weaned. Foley catheter was replaced today for a failed TOV. Peripheral IV access established, and central line and arterial lines will be removed today. PRN oxycodone given for pain control. Incision is intact and JP drains have moderate serosanguinous drainage. His wife is in to visit and they agree to call with needs.   Problem: Adult Inpatient Plan of Care  Goal: Plan of Care Review  Outcome: Progressing  Goal: Patient-Specific Goal (Individualized)  Outcome: Progressing  Goal: Absence of Hospital-Acquired Illness or Injury  Outcome: Progressing  Intervention: Identify and Manage Fall Risk  Recent Flowsheet Documentation  Taken 02/09/2022 0800 by Charleston Poot, RN  Safety Interventions:   aspiration precautions   bed alarm   bleeding precautions  Intervention: Prevent and Manage VTE (Venous Thromboembolism) Risk  Recent Flowsheet Documentation  Taken 02/09/2022 1400 by Charleston Poot, RN  Activity Management: activity adjusted per tolerance  Taken 02/09/2022 1000 by Charleston Poot, RN  Activity Management: activity adjusted per tolerance  Taken 02/09/2022 0800 by Charleston Poot, RN  Activity Management: activity adjusted per tolerance  Intervention: Prevent Infection  Recent Flowsheet Documentation  Taken 02/09/2022 0800 by Charleston Poot, RN  Infection Prevention: hand hygiene promoted  Goal: Optimal Comfort and Wellbeing  Outcome: Progressing  Goal: Readiness for Transition of Care  Outcome: Progressing  Goal: Rounds/Family Conference  Outcome: Progressing     Problem: Infection  Goal: Absence of Infection Signs and Symptoms  Outcome: Progressing  Intervention: Prevent or Manage Infection  Recent Flowsheet Documentation  Taken 02/09/2022 0800 by Charleston Poot, RN  Isolation Precautions: enteric precautions maintained     Problem: Impaired Wound Healing  Goal: Optimal Wound Healing  Outcome: Progressing  Intervention: Promote Wound Healing  Recent Flowsheet Documentation  Taken 02/09/2022 1400 by Charleston Poot, RN  Activity Management: activity adjusted per tolerance  Taken 02/09/2022 1000 by Charleston Poot, RN  Activity Management: activity adjusted per tolerance  Taken 02/09/2022 0800 by Charleston Poot, RN  Activity Management: activity adjusted per tolerance     Problem: Skin Injury Risk Increased  Goal: Skin Health and Integrity  Outcome: Progressing  Intervention: Optimize Skin Protection  Recent Flowsheet Documentation  Taken 02/09/2022 1400 by Charleston Poot, RN  Head of Bed [facility]) Positioning: HOB at 30 degrees  Taken 02/09/2022 1200 by Charleston Poot, RN  Head of Bed [facility]) Positioning: HOB at 60-90 degrees  Taken 02/09/2022 0800 by Charleston Poot, RN  Head of Bed [facility]) Positioning: HOB at 30-45 degrees     Problem: Self-Care Deficit  Goal: Improved Ability to Complete Activities of Daily Living  Outcome: Progressing     Problem: Fall Injury Risk  Goal: Absence of Fall and Fall-Related Injury  Outcome: Progressing  Intervention: Promote Injury-Free Environment  Recent Flowsheet Documentation  Taken 02/09/2022 0800 by Charleston Poot, RN  Safety Interventions:   aspiration precautions   bed alarm   bleeding precautions

## 2022-02-10 LAB — BLOOD GAS, VENOUS
BASE EXCESS VENOUS: -1.1 (ref -2.0–2.0)
HCO3 VENOUS: 24 mmol/L (ref 22–27)
O2 SATURATION VENOUS: 76.3 % (ref 40.0–85.0)
PCO2 VENOUS: 49 mmHg (ref 40–60)
PH VENOUS: 7.32 (ref 7.32–7.43)
PO2 VENOUS: 45 mmHg (ref 30–55)

## 2022-02-10 LAB — BASIC METABOLIC PANEL
ANION GAP: 9 mmol/L (ref 5–14)
BLOOD UREA NITROGEN: 54 mg/dL — ABNORMAL HIGH (ref 9–23)
BUN / CREAT RATIO: 26
CALCIUM: 8.8 mg/dL (ref 8.7–10.4)
CHLORIDE: 108 mmol/L — ABNORMAL HIGH (ref 98–107)
CO2: 24 mmol/L (ref 20.0–31.0)
CREATININE: 2.11 mg/dL — ABNORMAL HIGH
EGFR CKD-EPI (2021) MALE: 35 mL/min/{1.73_m2} — ABNORMAL LOW (ref >=60)
GLUCOSE RANDOM: 153 mg/dL (ref 70–179)
POTASSIUM: 4.5 mmol/L (ref 3.4–4.8)
SODIUM: 141 mmol/L (ref 135–145)

## 2022-02-10 LAB — HEPATIC FUNCTION PANEL
ALBUMIN: 3.7 g/dL (ref 3.4–5.0)
ALKALINE PHOSPHATASE: 58 U/L (ref 46–116)
ALT (SGPT): 164 U/L — ABNORMAL HIGH (ref 10–49)
AST (SGOT): 75 U/L — ABNORMAL HIGH (ref <=34)
BILIRUBIN DIRECT: 0.6 mg/dL — ABNORMAL HIGH (ref 0.00–0.30)
BILIRUBIN TOTAL: 0.9 mg/dL (ref 0.3–1.2)
PROTEIN TOTAL: 5.3 g/dL — ABNORMAL LOW (ref 5.7–8.2)

## 2022-02-10 LAB — CBC
HEMATOCRIT: 24.7 % — ABNORMAL LOW (ref 39.0–48.0)
HEMOGLOBIN: 8.5 g/dL — ABNORMAL LOW (ref 12.9–16.5)
MEAN CORPUSCULAR HEMOGLOBIN CONC: 34.5 g/dL (ref 32.0–36.0)
MEAN CORPUSCULAR HEMOGLOBIN: 33 pg — ABNORMAL HIGH (ref 25.9–32.4)
MEAN CORPUSCULAR VOLUME: 95.8 fL — ABNORMAL HIGH (ref 77.6–95.7)
MEAN PLATELET VOLUME: 11.4 fL — ABNORMAL HIGH (ref 6.8–10.7)
PLATELET COUNT: 30 10*9/L — ABNORMAL LOW (ref 150–450)
RED BLOOD CELL COUNT: 2.58 10*12/L — ABNORMAL LOW (ref 4.26–5.60)
RED CELL DISTRIBUTION WIDTH: 18 % — ABNORMAL HIGH (ref 12.2–15.2)
WBC ADJUSTED: 5.4 10*9/L (ref 3.6–11.2)

## 2022-02-10 LAB — MAGNESIUM: MAGNESIUM: 1.9 mg/dL (ref 1.6–2.6)

## 2022-02-10 LAB — TACROLIMUS LEVEL, TROUGH: TACROLIMUS, TROUGH: 2.4 ng/mL — ABNORMAL LOW (ref 5.0–15.0)

## 2022-02-10 LAB — PHOSPHORUS: PHOSPHORUS: 5 mg/dL (ref 2.4–5.1)

## 2022-02-10 MED ADMIN — mycophenolate (CELLCEPT) tablet 1,000 mg: 1000 mg | ORAL | @ 01:00:00

## 2022-02-10 MED ADMIN — methocarbamoL (ROBAXIN) tablet 500 mg: 500 mg | ORAL | @ 10:00:00

## 2022-02-10 MED ADMIN — valGANciclovir (VALCYTE) tablet 450 mg: 450 mg | ORAL | @ 14:00:00

## 2022-02-10 MED ADMIN — insulin regular (HumuLIN,NovoLIN) injection 0-20 Units: 0-20 [IU] | SUBCUTANEOUS | @ 05:00:00 | Stop: 2022-02-10

## 2022-02-10 MED ADMIN — piperacillin-tazobactam (ZOSYN) 3.375 g in sodium chloride 0.9 % (NS) 100 mL IVPB-MBP: 3.375 g | INTRAVENOUS | @ 20:00:00 | Stop: 2022-02-10

## 2022-02-10 MED ADMIN — tacrolimus (PROGRAF) capsule 4 mg: 4 mg | ORAL | @ 10:00:00 | Stop: 2022-02-10

## 2022-02-10 MED ADMIN — mycophenolate (CELLCEPT) tablet 1,000 mg: 1000 mg | ORAL | @ 14:00:00

## 2022-02-10 MED ADMIN — nystatin (MYCOSTATIN) oral suspension: 10 mL | ORAL | @ 20:00:00

## 2022-02-10 MED ADMIN — gabapentin (NEURONTIN) capsule 300 mg: 300 mg | ORAL | @ 01:00:00

## 2022-02-10 MED ADMIN — insulin NPH (HumuLIN,NovoLIN) injection 12 Units: 12 [IU] | SUBCUTANEOUS | @ 14:00:00

## 2022-02-10 MED ADMIN — tacrolimus (PROGRAF) 6 mg combo product: 6 mg | ORAL | @ 22:00:00

## 2022-02-10 MED ADMIN — methylPREDNISolone sodium succinate (PF) (Solu-MEDROL) injection 30 mg: 30 mg | INTRAVENOUS | @ 14:00:00 | Stop: 2022-02-10

## 2022-02-10 MED ADMIN — basiliximab (SIMULECT) 20 mg in sodium chloride (NS) 0.9 % 50 mL IVPB: 20 mg | INTRAVENOUS | @ 14:00:00 | Stop: 2022-02-10

## 2022-02-10 MED ADMIN — insulin NPH (HumuLIN,NovoLIN) injection 12 Units: 12 [IU] | SUBCUTANEOUS | @ 01:00:00

## 2022-02-10 MED ADMIN — oxyCODONE (ROXICODONE) immediate release tablet 10 mg: 10 mg | ORAL | @ 03:00:00 | Stop: 2022-02-20

## 2022-02-10 MED ADMIN — piperacillin-tazobactam (ZOSYN) 3.375 g in sodium chloride 0.9 % (NS) 100 mL IVPB-MBP: 3.375 g | INTRAVENOUS | @ 03:00:00 | Stop: 2022-02-11

## 2022-02-10 MED ADMIN — piperacillin-tazobactam (ZOSYN) 3.375 g in sodium chloride 0.9 % (NS) 100 mL IVPB-MBP: 3.375 g | INTRAVENOUS | @ 09:00:00 | Stop: 2022-02-10

## 2022-02-10 MED ADMIN — methocarbamoL (ROBAXIN) tablet 500 mg: 500 mg | ORAL | @ 01:00:00

## 2022-02-10 MED ADMIN — furosemide (LASIX) injection 60 mg: 60 mg | INTRAVENOUS | @ 20:00:00

## 2022-02-10 MED ADMIN — insulin regular (HumuLIN,NovoLIN) injection 0-20 Units: 0-20 [IU] | SUBCUTANEOUS | @ 17:00:00 | Stop: 2022-02-10

## 2022-02-10 MED ADMIN — polyethylene glycol (MIRALAX) packet 17 g: 17 g | ORAL | @ 14:00:00 | Stop: 2022-02-10

## 2022-02-10 MED ADMIN — oxyCODONE (ROXICODONE) immediate release tablet 10 mg: 10 mg | ORAL | @ 17:00:00 | Stop: 2022-02-20

## 2022-02-10 MED ADMIN — HYDROmorphone (PF) (DILAUDID) injection 1 mg: 1 mg | INTRAVENOUS | @ 01:00:00 | Stop: 2022-02-21

## 2022-02-10 MED ADMIN — HYDROmorphone (PF) (DILAUDID) injection 1 mg: 1 mg | INTRAVENOUS | @ 05:00:00 | Stop: 2022-02-21

## 2022-02-10 MED ADMIN — pantoprazole (PROTONIX) EC tablet 40 mg: 40 mg | ORAL | @ 14:00:00

## 2022-02-10 MED ADMIN — nystatin (MYCOSTATIN) oral suspension: 10 mL | ORAL | @ 10:00:00

## 2022-02-10 MED ADMIN — oxyCODONE (ROXICODONE) immediate release tablet 10 mg: 10 mg | ORAL | @ 10:00:00 | Stop: 2022-02-20

## 2022-02-10 MED ADMIN — piperacillin-tazobactam (ZOSYN) 3.375 g in sodium chloride 0.9 % (NS) 100 mL IVPB-MBP: 3.375 g | INTRAVENOUS | @ 14:00:00 | Stop: 2022-02-10

## 2022-02-10 MED ADMIN — methylPREDNISolone sodium succinate (PF) (Solu-MEDROL) injection 40 mg: 40 mg | INTRAVENOUS | @ 01:00:00 | Stop: 2022-02-09

## 2022-02-10 MED ADMIN — HYDROmorphone (PF) (DILAUDID) injection 1 mg: 1 mg | INTRAVENOUS | @ 09:00:00 | Stop: 2022-02-21

## 2022-02-10 MED ADMIN — nystatin (MYCOSTATIN) oral suspension: 10 mL | ORAL | @ 03:00:00

## 2022-02-10 MED ADMIN — insulin regular (HumuLIN,NovoLIN) injection 0-20 Units: 0-20 [IU] | SUBCUTANEOUS | @ 22:00:00 | Stop: 2022-02-10

## 2022-02-10 MED ADMIN — oxyCODONE (ROXICODONE) immediate release tablet 10 mg: 10 mg | ORAL | @ 22:00:00 | Stop: 2022-02-20

## 2022-02-10 MED ADMIN — furosemide (LASIX) injection 60 mg: 60 mg | INTRAVENOUS | @ 17:00:00

## 2022-02-10 MED ADMIN — methocarbamoL (ROBAXIN) tablet 500 mg: 500 mg | ORAL | @ 17:00:00

## 2022-02-10 NOTE — Unmapped (Signed)
""  VENOUS ACCESS TEAM PROCEDURE    Order was placed for a \""PIV by Venous Access Team (VAT)\"".  Patient was assessed at bedside for placement of a PIV. PPE were donned per protocol.  Access was obtained. Blood return noted.  Dressing intact and device well secured.  Flushed with normal saline.  See LDA for details.  Pt advised to inform RN of any s/s of discomfort at the PIV site.    Workup / Procedure Time:  30 minutes       Care RN was notified.       Thank you,     Karlo Goeden RN Venous Access Team""

## 2022-02-10 NOTE — Unmapped (Signed)
CVAD Liaison Consult    CVAD Liaison Nurse was consulted for MAC introducer removal,CVAD Liaison made primary RN aware that CVAD does not remove MAC introducer.Also pt.platelet is very low,CVAD told primary RN to make provider aware due to possible bleeding with line removal and will need to hold more pressure post line removal.Thank you for this consult,  Thalia Bloodgood RN    Consult Time 15 minutes (min)

## 2022-02-10 NOTE — Unmapped (Signed)
Patient alert and oriented x4, VSS. Patient complained of surgical abdominal pain, given prn oxycodone and dilaudid, patient verbalized pain decrease. 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 Patient maintained on 2L [place] for sleep, patient declined hospital CPAP, oxygen within order parameters. Patient continued on regular diet, oral nutritional and fluid intake encouraged, had adequate appetite and dinner, patient independently ordered breakfast. Blood glucose monitored, insulin given per order. Large output from JP drains x2 from RLQ monitored qh4. Adequate foley UOP monitored q4hr. Bed bath given. Patient ambulated to toilet with x1 person contact assist. Incentive spirometry encouraged and used while awake. Patient continued on protective precautions for history of liver transplant. POC reviewed with patient, all questions and concerns addressed.     Problem: Adult Inpatient Plan of Care  Goal: Plan of Care Review  Outcome: Progressing  Goal: Patient-Specific Goal (Individualized)  Description: Patient will be able to have bowel movement by day shift on 02/11/2022.   Outcome: Progressing  Goal: Absence of Hospital-Acquired Illness or Injury  Outcome: Progressing  Intervention: Identify and Manage Fall Risk  Recent Flowsheet Documentation  Taken 02/09/2022 2000 by Edmund Hilda, RN  Safety Interventions:  ??? aspiration precautions  ??? bed alarm  ??? bleeding precautions  ??? commode/urinal/bedpan at bedside  ??? environmental modification  ??? fall reduction program maintained  ??? infection management  ??? lighting adjusted for tasks/safety  ??? low bed  ??? nonskid shoes/slippers when out of bed  ??? room near unit station  Intervention: Prevent Skin Injury  Recent Flowsheet Documentation  Taken 02/09/2022 2000 by Edmund Hilda, RN  Skin Protection:  ??? adhesive use limited  ??? cleansing with dimethicone incontinence wipes  ??? incontinence pads utilized  ??? transparent dressing maintained  ??? tubing/devices free from skin contact  ??? silicone foam dressing in place  ??? skin-to-device areas padded  ??? skin-to-skin areas padded  Intervention: Prevent and Manage VTE (Venous Thromboembolism) Risk  Recent Flowsheet Documentation  Taken 02/10/2022 0200 by Edmund Hilda, RN  Activity Management: activity adjusted per tolerance  Taken 02/10/2022 0000 by Edmund Hilda, RN  Activity Management: activity adjusted per tolerance  Taken 02/09/2022 2200 by Edmund Hilda, RN  Activity Management: activity adjusted per tolerance  Taken 02/09/2022 2000 by Edmund Hilda, RN  Activity Management: activity adjusted per tolerance  VTE Prevention/Management:  ??? ambulation promoted  ??? anticoagulant therapy  ??? bleeding precautions maintained  ??? bleeding risk factors identified  ??? dorsiflexion/plantar flexion performed  ??? fluids promoted  Intervention: Prevent Infection  Recent Flowsheet Documentation  Taken 02/09/2022 2000 by Edmund Hilda, RN  Infection Prevention:  ??? cohorting utilized  ??? environmental surveillance performed  ??? equipment surfaces disinfected  ??? hand hygiene promoted  ??? rest/sleep promoted  ??? personal protective equipment utilized  ??? single patient room provided     Problem: Infection  Goal: Absence of Infection Signs and Symptoms  Outcome: Progressing  Intervention: Prevent or Manage Infection  Recent Flowsheet Documentation  Taken 02/09/2022 2000 by Edmund Hilda, RN  Infection Management: aseptic technique maintained  Isolation Precautions: protective precautions maintained  Problem: Impaired Wound Healing  Goal: Optimal Wound Healing  Outcome: Progressing  Intervention: Promote Wound Healing  Recent Flowsheet Documentation  Taken 02/10/2022 0200 by Edmund Hilda, RN  Activity Management: activity adjusted per tolerance  Taken 02/10/2022 0000 by Edmund Hilda, RN  Activity Management: activity adjusted per tolerance  Taken 02/09/2022 2200 by Edmund Hilda, RN  Activity Management: activity adjusted per tolerance  Sleep/Rest Enhancement:  ??? awakenings minimized  ??? consistent schedule promoted  ??? regular sleep/rest pattern promoted  ??? room darkened  Taken 02/09/2022 2000 by Edmund Hilda, RN  Activity Management: activity adjusted per tolerance  Sleep/Rest Enhancement:  ??? consistent schedule promoted  ??? regular sleep/rest pattern promoted  ??? relaxation techniques promoted  ??? room darkened  ??? therapeutic touch utilized     Problem: Skin Injury Risk Increased  Goal: Skin Health and Integrity  Outcome: Progressing  Intervention: Optimize Skin Protection  Recent Flowsheet Documentation  Taken 02/10/2022 0200 by Edmund Hilda, RN  Head of Bed [facility]) Positioning: HOB at 30-45 degrees  Taken 02/10/2022 0000 by Edmund Hilda, RN  Head of Bed [facility]) Positioning: HOB at 30-45 degrees  Taken 02/09/2022 2200 by Edmund Hilda, RN  Head of Bed [facility]) Positioning: HOB at 30-45 degrees  Taken 02/09/2022 2000 by Edmund Hilda, RN  Pressure Reduction Techniques:  ??? frequent weight shift encouraged  ??? heels elevated off bed  ??? positioned off wounds  ??? pressure points protected  Head of Bed (HOB) Positioning: HOB at 30-45 degrees  Pressure Reduction Devices:  ??? positioning supports utilized  ??? pressure-redistributing mattress utilized  ??? specialty bed utilized  Skin Protection:  ??? adhesive use limited  ??? cleansing with dimethicone incontinence wipes  ??? incontinence pads utilized  ??? transparent dressing maintained  ??? tubing/devices free from skin contact  ??? silicone foam dressing in place  ??? skin-to-device areas padded  ??? skin-to-skin areas padded     Problem: Fall Injury Risk  Goal: Absence of Fall and Fall-Related Injury  Outcome: Progressing  Intervention: Promote Injury-Free Environment  Recent Flowsheet Documentation  Taken 02/09/2022 2000 by Edmund Hilda, RN  Safety Interventions:  ??? aspiration precautions  ??? bed alarm  ??? bleeding precautions  ??? commode/urinal/bedpan at bedside  ??? environmental modification  ??? fall reduction program maintained  ??? infection management  ??? lighting adjusted for tasks/safety  ??? low bed  ??? nonskid shoes/slippers when out of bed  ??? room near unit station

## 2022-02-10 NOTE — Unmapped (Signed)
Pt transferred to [facility] on tele and RN escort.    Problem: Adult Inpatient Plan of Care  Goal: Plan of Care Review  Outcome: Progressing  Goal: Patient-Specific Goal (Individualized)  Description: Patient will be able to have bowel movement by day shift on 02/11/2022.   Outcome: Progressing  Goal: Absence of Hospital-Acquired Illness or Injury  Outcome: Progressing  Intervention: Prevent Skin Injury  Recent Flowsheet Documentation  Taken 02/10/2022 0800 by Laurence Compton, RN  Skin Protection:   adhesive use limited   cleansing with dimethicone incontinence wipes   incontinence pads utilized   silicone foam dressing in place   tubing/devices free from skin contact  Intervention: Prevent and Manage VTE (Venous Thromboembolism) Risk  Recent Flowsheet Documentation  Taken 02/10/2022 0800 by Laurence Compton, RN  Activity Management: activity adjusted per tolerance  VTE Prevention/Management:   anticoagulant therapy   ambulation promoted   fluids promoted   dorsiflexion/plantar flexion performed  Intervention: Prevent Infection  Recent Flowsheet Documentation  Taken 02/10/2022 0800 by Laurence Compton, RN  Infection Prevention:   hand hygiene promoted   personal protective equipment utilized  Goal: Optimal Comfort and Wellbeing  Outcome: Progressing  Goal: Readiness for Transition of Care  Outcome: Progressing  Goal: Rounds/Family Conference  Outcome: Progressing     Problem: Infection  Goal: Absence of Infection Signs and Symptoms  Outcome: Progressing  Intervention: Prevent or Manage Infection  Recent Flowsheet Documentation  Taken 02/10/2022 0800 by Laurence Compton, RN  Infection Management: aseptic technique maintained  Isolation Precautions:   enteric precautions initiated   protective precautions maintained     Problem: Impaired Wound Healing  Goal: Optimal Wound Healing  Outcome: Progressing  Intervention: Promote Wound Healing  Recent Flowsheet Documentation  Taken 02/10/2022 0800 by Laurence Compton, RN  Activity Management: activity adjusted per tolerance     Problem: Skin Injury Risk Increased  Goal: Skin Health and Integrity  Outcome: Progressing  Intervention: Optimize Skin Protection  Recent Flowsheet Documentation  Taken 02/10/2022 0800 by Laurence Compton, RN  Pressure Reduction Techniques:   frequent weight shift encouraged   heels elevated off bed   pressure points protected  Head of Bed (HOB) Positioning: HOB at 30-45 degrees  Pressure Reduction Devices:   foam padding utilized   positioning supports utilized   pressure-redistributing mattress utilized  Skin Protection:   adhesive use limited   cleansing with dimethicone incontinence wipes   incontinence pads utilized   silicone foam dressing in place   tubing/devices free from skin contact     Problem: Self-Care Deficit  Goal: Improved Ability to Complete Activities of Daily Living  Outcome: Progressing  Intervention: Promote Activity and Functional Independence  Recent Flowsheet Documentation  Taken 02/10/2022 0800 by Laurence Compton, RN  Self-Care Promotion:   BADL personal objects within reach   independence encouraged     Problem: Fall Injury Risk  Goal: Absence of Fall and Fall-Related Injury  Outcome: Progressing  Intervention: Identify and Manage Contributors  Recent Flowsheet Documentation  Taken 02/10/2022 0800 by Laurence Compton, RN  Self-Care Promotion:   BADL personal objects within reach   independence encouraged

## 2022-02-10 NOTE — Unmapped (Incomplete Revision)
Transplant Surgery Progress Note    Date of service: 02/10/2022    Hospital Day:  LOS: 4 days   Surgery Date(s): 02/06/22    Interval History:     Pt doing well overnight. Continues to have foley but with adequate urine output. Denies N/V and tolerating diet.         Assessment/Plan:  Matthew Key is a 61 y.o. 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     Neurological:    *Pain and sedation  - MMPC    Cardiovascular:   *MAP goal >65  - Off of pressors  - Discontinued central line on 7/2    Pulmonary:   *Extubated on 02/07/22, now on 2L [place]  - Pulm toilet/OOB  - IS to 1500 today      Renal/Genitourinary:  *Strict I&Os  - Trend BUN/Cr  - Failed TOV 6/30  - Replaced Foley 7/1  - repeat 60 mg IV lasix    GI/Nutrition:  F: ML  E: Replete PRN  N: regular diet    *S/p OLT on 02/06/22  - q12 labs   - Liver transplant doppler: WNL  - Surgical drains to bulb suction x2  - 25% albumin BID    Heme:   DVT ppx: holding given thrombocytopenia. Plts 30 from 27  - q12 CBC    Induction immunosuppression: methylpred and basiliximab  Maintenance immunosuppression: MMF and tac    ID:  - Zosyn x5 days    Dispo: transfer to floor     Objective:    Physical Exam:    General: NAD  HEENT: Nasal cannula  CV: Regular rate per telemetry  Resp: NWOB on 2L [place]  Abdomen: Soft, mildly distended, appropriately tender, surgical drains w/ serosanguionous output x2. Mercedes incision clean and intact, small amount of serosanguinous drainage on dressings  Extremities: WWP    Data Review:   I have reviewed the labs and studies from the last 24 hours.    Vitals Reviewed:    Temp:  [36.4 ??C (97.6 ??F)-37 ??C (98.6 ??F)] 36.7 ??C (98.1 ??F)  Heart Rate:  [67-97] 91  SpO2 Pulse:  [68-90] 84  Resp:  [6-18] 18  BP: (118-156)/(68-79) 118/68  MAP (mmHg):  [90-102] 92  SpO2:  [93 %-97 %] 95 %   Temp (24hrs), Avg:36.8 ??C (98.2 ??F), Min:36.4 ??C (97.6 ??F), Max:37 ??C (98.6 ??F)     SpO2: 95 %   Height: 172.7 cm (5' 7.99)    Weight: (!) 105.7 kg (233 lb 0.4 oz)    Body mass index is 35.44 kg/m??.    Body surface area is 2.25 meters squared.       Intake/Output Summary (Last 24 hours) at 02/10/2022 2347  Last data filed at 02/10/2022 2344  Gross per 24 hour   Intake 1170 ml   Output 16109 ml   Net -9015 ml        I/O last 3 completed shifts:  In: 1860 [P.O.:1330; I.V.:30; IV Piggyback:500]  Out: 60454 [phone]; Drains:1570]   I/O this shift:  In: -   Out: 3870 [Urine:3700; Drains:170]      Continuous Infusions:         Hemodynamic/Invasive Device Data (24 hrs):               Ventilation/Oxygen Therapy (24hrs):  O2 Flow Rate (L/min):  [2 L/min]  2 L/min    Tubes and Drains:  Patient Lines/Drains/Airways Status       Active Active Lines, Drains, & Airways       Name Placement date Placement time Site Days    Closed/Suction Drain 1 Anterior RLQ Bulb 10 Fr. 02/06/22  1052  RLQ  4    Closed/Suction Drain 2 Anterior;Lateral RLQ Bulb 10 Fr. 02/06/22  1150  RLQ  4    Urethral Catheter 02/09/22  1000  --  1    Peripheral IV 02/10/22 Anterior;Right Forearm 02/10/22  1401  Forearm  less than 1                    ------------------------------    Warrick Parisian, MD  PGY-1 General Surgery

## 2022-02-10 NOTE — Unmapped (Signed)
Transplant Surgery Progress Note    Date of service: 02/10/2022    Hospital Day:  LOS: 4 days   Surgery Date(s): 02/06/22    Interval History:     Pt doing well overnight. Continues to have foley but with adequate urine output. Denies N/V and tolerating diet.         Assessment/Plan:  Matthew Key is a 61 y.o. 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 Plts 30 from 27  - q12 CBC    Induction immunosuppression: methylpred and basiliximab  Maintenance immunosuppression: MMF and tac    ID:  - Zosyn x5 days    Dispo: transfer to floor     Objective:    Physical Exam:    General: NAD  HEENT: Nasal cannula  CV: Regular rate per telemetry  Resp: NWOB on 2L [place]  Abdomen: Soft, mildly distended, appropriately tender, surgical drains w/ serosanguionous output x2. Mercedes incision clean and intact, small amount of serosanguinous drainage on dressings  Extremities: WWP    Data Review:   I have reviewed the labs and studies from the last 24 hours.    Vitals Reviewed:    Temp:  [36.4 ??C (97.6 ??F)-37 ??C (98.6 ??F)] 36.7 ??C (98.1 ??F)  Heart Rate:  [67-97] 91  SpO2 Pulse:  [68-90] 84  Resp:  [6-18] 18  BP: (118-156)/(68-79) 118/68  MAP (mmHg):  [90-102] 92  SpO2:  [93 %-97 %] 95 %   Temp (24hrs), Avg:36.8 ??C (98.2 ??F), Min:36.4 ??C (97.6 ??F), Max:37 ??C (98.6 ??F)     SpO2: 95 %   Height: 172.7 cm (5' 7.99)    Weight: (!) 105.7 kg (233 lb 0.4 oz)    Body mass index is 35.44 kg/m??.    Body surface area is 2.25 meters squared.       Intake/Output Summary (Last 24 hours) at 02/10/2022 2347  Last data filed at 02/10/2022 2344  Gross per 24 hour   Intake 1170 ml   Output 16109 ml   Net -9015 ml        I/O last 3 completed shifts:  In: 1860 [P.O.:1330; I.V.:30; IV Piggyback:500]  Out: 60454 [phone]; Drains:1570]   I/O this shift:  In: -   Out: 3870 [Urine:3700; Drains:170]      Continuous Infusions:         Hemodynamic/Invasive Device Data (24 hrs):               Ventilation/Oxygen Therapy (24hrs):  O2 Flow Rate (L/min):  [  2 L/min] 2 L/min    Tubes and Drains:  Patient Lines/Drains/Airways Status       Active Active Lines, Drains, & Airways       Name Placement date Placement time Site Days    Closed/Suction Drain 1 Anterior RLQ Bulb 10 Fr. 02/06/22  1052  RLQ  4    Closed/Suction Drain 2 Anterior;Lateral RLQ Bulb 10 Fr. 02/06/22  1150  RLQ  4    Urethral Catheter 02/09/22  1000  --  1    Peripheral IV 02/10/22 Anterior;Right Forearm 02/10/22  1401  Forearm  less than 1                    ------------------------------    Warrick Parisian, MD  PGY-1 General Surgery

## 2022-02-10 NOTE — Unmapped (Signed)
Tacrolimus Therapeutic Monitoring Pharmacy Note    Zayvion Stailey is a 61 y.o. male starting tacrolimus.     Indication: Liver transplant     Date of Transplant:  02/06/22       Prior Dosing Information: Current regimen 4 mg BID      Goals:  Therapeutic Drug Levels  Tacrolimus trough goal: 8-10 ng/mL    Additional Clinical Monitoring/Outcomes  Monitor renal function (SCr and urine output) and liver function (LFTs)  Monitor for signs/symptoms of adverse events (e.g., hyperglycemia, hyperkalemia, hypomagnesemia, hypertension, headache, tremor)    Results:   Tacrolimus level:  2.4 ng/mL, drawn appropriately    Pharmacokinetic Considerations and Significant Drug Interactions:   Concurrent hepatotoxic medications: None identified  Concurrent CYP3A4 substrates/inhibitors: None identified  Concurrent nephrotoxic medications: None identified    Assessment/Plan:  Recommendedation(s)  Increase to tac 6 mg BID.    Follow-up  Daily tac levels  .   A pharmacist will continue to monitor and recommend levels as appropriate    Please page service pharmacist with questions/clarifications.    Riccardo Dubin, PharmD

## 2022-02-11 DIAGNOSIS — Z944 Liver transplant status: Principal | ICD-10-CM

## 2022-02-11 DIAGNOSIS — Z796 Long-term use of immunosuppressant medication: Principal | ICD-10-CM

## 2022-02-11 LAB — MAGNESIUM
MAGNESIUM: 1.6 mg/dL (ref 1.6–2.6)
MAGNESIUM: 1.6 mg/dL (ref 1.6–2.6)

## 2022-02-11 LAB — CBC
HEMATOCRIT: 27 % — ABNORMAL LOW (ref 39.0–48.0)
HEMATOCRIT: 26.8 % — ABNORMAL LOW (ref 39.0–48.0)
HEMOGLOBIN: 9.6 g/dL — ABNORMAL LOW (ref 12.9–16.5)
HEMOGLOBIN: 9.4 g/dL — ABNORMAL LOW (ref 12.9–16.5)
MEAN CORPUSCULAR HEMOGLOBIN CONC: 35.7 g/dL (ref 32.0–36.0)
MEAN CORPUSCULAR HEMOGLOBIN CONC: 35.2 g/dL (ref 32.0–36.0)
MEAN CORPUSCULAR HEMOGLOBIN: 33.5 pg — ABNORMAL HIGH (ref 25.9–32.4)
MEAN CORPUSCULAR HEMOGLOBIN: 33.4 pg — ABNORMAL HIGH (ref 25.9–32.4)
MEAN CORPUSCULAR VOLUME: 93.9 fL (ref 77.6–95.7)
MEAN CORPUSCULAR VOLUME: 94.9 fL (ref 77.6–95.7)
MEAN PLATELET VOLUME: 10.9 fL — ABNORMAL HIGH (ref 6.8–10.7)
MEAN PLATELET VOLUME: 11.5 fL — ABNORMAL HIGH (ref 6.8–10.7)
PLATELET COUNT: 42 10*9/L — ABNORMAL LOW (ref 150–450)
PLATELET COUNT: 44 10*9/L — ABNORMAL LOW (ref 150–450)
RED BLOOD CELL COUNT: 2.88 10*12/L — ABNORMAL LOW (ref 4.26–5.60)
RED BLOOD CELL COUNT: 2.82 10*12/L — ABNORMAL LOW (ref 4.26–5.60)
RED CELL DISTRIBUTION WIDTH: 17.3 % — ABNORMAL HIGH (ref 12.2–15.2)
RED CELL DISTRIBUTION WIDTH: 17.4 % — ABNORMAL HIGH (ref 12.2–15.2)
WBC ADJUSTED: 6.4 10*9/L (ref 3.6–11.2)
WBC ADJUSTED: 4.8 10*9/L (ref 3.6–11.2)

## 2022-02-11 LAB — HEPATIC FUNCTION PANEL
ALBUMIN: 3.6 g/dL (ref 3.4–5.0)
ALBUMIN: 3.5 g/dL (ref 3.4–5.0)
ALKALINE PHOSPHATASE: 102 U/L (ref 46–116)
ALKALINE PHOSPHATASE: 116 U/L (ref 46–116)
ALT (SGPT): 157 U/L — ABNORMAL HIGH (ref 10–49)
ALT (SGPT): 168 U/L — ABNORMAL HIGH (ref 10–49)
AST (SGOT): 53 U/L — ABNORMAL HIGH (ref <=34)
AST (SGOT): 70 U/L — ABNORMAL HIGH (ref <=34)
BILIRUBIN DIRECT: 0.6 mg/dL — ABNORMAL HIGH (ref 0.00–0.30)
BILIRUBIN DIRECT: 0.6 mg/dL — ABNORMAL HIGH (ref 0.00–0.30)
BILIRUBIN TOTAL: 0.9 mg/dL (ref 0.3–1.2)
BILIRUBIN TOTAL: 1 mg/dL (ref 0.3–1.2)
PROTEIN TOTAL: 5.3 g/dL — ABNORMAL LOW (ref 5.7–8.2)
PROTEIN TOTAL: 5.2 g/dL — ABNORMAL LOW (ref 5.7–8.2)

## 2022-02-11 LAB — BASIC METABOLIC PANEL
ANION GAP: 6 mmol/L (ref 5–14)
ANION GAP: 9 mmol/L (ref 5–14)
BLOOD UREA NITROGEN: 38 mg/dL — ABNORMAL HIGH (ref 9–23)
BLOOD UREA NITROGEN: 35 mg/dL — ABNORMAL HIGH (ref 9–23)
BUN / CREAT RATIO: 21
BUN / CREAT RATIO: 20
CALCIUM: 8.8 mg/dL (ref 8.7–10.4)
CALCIUM: 9 mg/dL (ref 8.7–10.4)
CHLORIDE: 105 mmol/L (ref 98–107)
CHLORIDE: 106 mmol/L (ref 98–107)
CO2: 28 mmol/L (ref 20.0–31.0)
CO2: 25 mmol/L (ref 20.0–31.0)
CREATININE: 1.82 mg/dL — ABNORMAL HIGH
CREATININE: 1.73 mg/dL — ABNORMAL HIGH
EGFR CKD-EPI (2021) MALE: 41 mL/min/{1.73_m2} — ABNORMAL LOW (ref >=60)
EGFR CKD-EPI (2021) MALE: 44 mL/min/{1.73_m2} — ABNORMAL LOW (ref >=60)
GLUCOSE RANDOM: 237 mg/dL — ABNORMAL HIGH (ref 70–179)
GLUCOSE RANDOM: 174 mg/dL (ref 70–179)
POTASSIUM: 3.8 mmol/L (ref 3.4–4.8)
POTASSIUM: 4.8 mmol/L (ref 3.4–4.8)
SODIUM: 139 mmol/L (ref 135–145)
SODIUM: 140 mmol/L (ref 135–145)

## 2022-02-11 LAB — PROTIME-INR
INR: 1.2
INR: 1.25
PROTIME: 13.7 s — ABNORMAL HIGH (ref 9.8–12.8)
PROTIME: 14.3 s — ABNORMAL HIGH (ref 9.8–12.8)

## 2022-02-11 LAB — PHOSPHORUS
PHOSPHORUS: 3.7 mg/dL (ref 2.4–5.1)
PHOSPHORUS: 4.3 mg/dL (ref 2.4–5.1)

## 2022-02-11 LAB — GAMMA GT
GAMMA GLUTAMYL TRANSFERASE: 281 U/L — ABNORMAL HIGH
GAMMA GLUTAMYL TRANSFERASE: 356 U/L — ABNORMAL HIGH

## 2022-02-11 LAB — TACROLIMUS LEVEL, TROUGH: TACROLIMUS, TROUGH: 3.2 ng/mL — ABNORMAL LOW (ref 5.0–15.0)

## 2022-02-11 MED ORDER — VALGANCICLOVIR 450 MG TABLET
ORAL_TABLET | Freq: Every day | ORAL | 2 refills | 30 days | Status: CP
Start: 2022-02-11 — End: 2022-05-12
  Filled 2022-02-15: qty 30, 30d supply, fill #0

## 2022-02-11 MED ORDER — MYCOPHENOLATE MOFETIL 250 MG CAPSULE
ORAL_CAPSULE | Freq: Two times a day (BID) | ORAL | 11 refills | 23.00000 days | Status: CP
Start: 2022-02-11 — End: 2022-02-11
  Filled 2022-02-15: qty 240, 30d supply, fill #0

## 2022-02-11 MED ORDER — TACROLIMUS 1 MG CAPSULE, IMMEDIATE-RELEASE
ORAL_CAPSULE | Freq: Two times a day (BID) | ORAL | 11 refills | 30 days | Status: CP
Start: 2022-02-11 — End: 2023-02-11

## 2022-02-11 MED ADMIN — nystatin (MYCOSTATIN) oral suspension: 10 mL | ORAL | @ 10:00:00

## 2022-02-11 MED ADMIN — piperacillin-tazobactam (ZOSYN) 3.375 g in sodium chloride 0.9 % (NS) 100 mL IVPB-MBP: 3.375 g | INTRAVENOUS | @ 13:00:00 | Stop: 2022-02-11

## 2022-02-11 MED ADMIN — insulin regular (HumuLIN,NovoLIN) injection 0-20 Units: 0-20 [IU] | SUBCUTANEOUS | @ 10:00:00 | Stop: 2022-02-11

## 2022-02-11 MED ADMIN — oxyCODONE (ROXICODONE) immediate release tablet 10 mg: 10 mg | ORAL | @ 16:00:00 | Stop: 2022-02-25

## 2022-02-11 MED ADMIN — insulin NPH (HumuLIN,NovoLIN) injection 13 Units: .25 [IU]/kg/d | SUBCUTANEOUS

## 2022-02-11 MED ADMIN — insulin lispro (HumaLOG) injection 7 Units: 7 [IU] | SUBCUTANEOUS | @ 18:00:00

## 2022-02-11 MED ADMIN — methylPREDNISolone sodium succinate (PF) (Solu-MEDROL) injection 20 mg: 20 mg | INTRAVENOUS | @ 13:00:00 | Stop: 2022-02-11

## 2022-02-11 MED ADMIN — tacrolimus (PROGRAF) 6 mg combo product: 6 mg | ORAL | @ 22:00:00

## 2022-02-11 MED ADMIN — oxyCODONE (ROXICODONE) immediate release tablet 10 mg: 10 mg | ORAL | @ 20:00:00 | Stop: 2022-02-25

## 2022-02-11 MED ADMIN — piperacillin-tazobactam (ZOSYN) 3.375 g in sodium chloride 0.9 % (NS) 100 mL IVPB-MBP: 3.375 g | INTRAVENOUS | @ 02:00:00 | Stop: 2022-02-11

## 2022-02-11 MED ADMIN — polyethylene glycol (MIRALAX) packet 17 g: 17 g | ORAL | @ 02:00:00

## 2022-02-11 MED ADMIN — insulin NPH (HumuLIN,NovoLIN) injection 13 Units: .25 [IU]/kg/d | SUBCUTANEOUS | @ 13:00:00

## 2022-02-11 MED ADMIN — nystatin (MYCOSTATIN) oral suspension: 10 mL | ORAL | @ 18:00:00

## 2022-02-11 MED ADMIN — insulin lispro (HumaLOG) injection 7 Units: 7 [IU] | SUBCUTANEOUS

## 2022-02-11 MED ADMIN — heparin (porcine) 5,000 unit/mL injection 5,000 Units: 5000 [IU] | SUBCUTANEOUS | @ 13:00:00

## 2022-02-11 MED ADMIN — tacrolimus (PROGRAF) 6 mg combo product: 6 mg | ORAL | @ 10:00:00

## 2022-02-11 MED ADMIN — pantoprazole (PROTONIX) EC tablet 40 mg: 40 mg | ORAL | @ 10:00:00

## 2022-02-11 MED ADMIN — methocarbamoL (ROBAXIN) tablet 500 mg: 500 mg | ORAL | @ 10:00:00

## 2022-02-11 MED ADMIN — mycophenolate (CELLCEPT) tablet 1,000 mg: 1000 mg | ORAL | @ 10:00:00

## 2022-02-11 MED ADMIN — insulin lispro (HumaLOG) injection 7 Units: 7 [IU] | SUBCUTANEOUS | @ 13:00:00

## 2022-02-11 MED ADMIN — insulin regular (HumuLIN,NovoLIN) injection 0-20 Units: 0-20 [IU] | SUBCUTANEOUS | @ 02:00:00

## 2022-02-11 MED ADMIN — nystatin (MYCOSTATIN) oral suspension: 10 mL | ORAL | @ 02:00:00

## 2022-02-11 MED ADMIN — piperacillin-tazobactam (ZOSYN) 3.375 g in sodium chloride 0.9 % (NS) 100 mL IVPB-MBP: 3.375 g | INTRAVENOUS | @ 08:00:00 | Stop: 2022-02-11

## 2022-02-11 MED ADMIN — mycophenolate (CELLCEPT) tablet 1,000 mg: 1000 mg | ORAL

## 2022-02-11 MED ADMIN — methocarbamoL (ROBAXIN) tablet 500 mg: 500 mg | ORAL | @ 16:00:00

## 2022-02-11 MED ADMIN — furosemide (LASIX) injection 60 mg: 60 mg | INTRAVENOUS | @ 10:00:00

## 2022-02-11 MED ADMIN — insulin lispro (HumaLOG) injection 0-20 Units: 0-20 [IU] | SUBCUTANEOUS

## 2022-02-11 MED ADMIN — gabapentin (NEURONTIN) capsule 300 mg: 300 mg | ORAL | @ 02:00:00

## 2022-02-11 MED ADMIN — methylPREDNISolone sodium succinate (PF) (Solu-MEDROL) injection 30 mg: 30 mg | INTRAVENOUS | @ 02:00:00 | Stop: 2022-02-10

## 2022-02-11 MED ADMIN — furosemide (LASIX) injection 60 mg: 60 mg | INTRAVENOUS | @ 18:00:00

## 2022-02-11 MED ADMIN — polyethylene glycol (MIRALAX) packet 17 g: 17 g | ORAL | @ 13:00:00

## 2022-02-11 MED ADMIN — methocarbamoL (ROBAXIN) tablet 500 mg: 500 mg | ORAL | @ 20:00:00

## 2022-02-11 MED ADMIN — sulfamethoxazole-trimethoprim (BACTRIM) 400-80 mg tablet 80 mg of trimethoprim: 1 | ORAL | @ 13:00:00 | Stop: 2022-05-13

## 2022-02-11 MED ADMIN — tamsulosin (FLOMAX) 24 hr capsule 0.4 mg: .4 mg | ORAL | @ 02:00:00

## 2022-02-11 MED ADMIN — methocarbamoL (ROBAXIN) tablet 500 mg: 500 mg | ORAL | @ 02:00:00

## 2022-02-11 MED ADMIN — melatonin tablet 3 mg: 3 mg | ORAL | @ 02:00:00

## 2022-02-11 NOTE — Unmapped (Signed)
Met with spouse and patient to review transplant education booklet. One hour was spent reviewing material and answering questions.   Learning Readiness: spouse and patient acceptance, eager and patient tearful at times, grateful for this gift  Method of Instruction: Written instruction - handouts and Verbal instruction.    Topics reviewed include: How to contact the [facility] for Transplant Care, Signs and symptoms of infection and rejection to notify your coordinator, Medications - importance of taking medications as ordered and introduction to the MedActionPlan medicine list and immunosuppression, Lab work - frequency, holding immunosuppression prior to blood draw, and importance in monitoring for rejection, Wound care - daily assessment of the surgical wounds for infection; keep wounds clean and dry, Clinic appointments and health maintenance screenings, Keeping a daily log for 6 weeks (or as directed by your coordinator), No live virus vaccines, Avoiding infections - Handwashing and no sick contacts, no gardening for the first 3 months, limit diaper changing, Pet care, Activity & lifestyles changes; use sun screen to prevent skin cancer, no smoking/ drinking, driving and lifting, Return to sexual activity and using barrier protection, Dietary restrictions including no grapefruit or grapefruit juice, eating cold foods cold and hot foods hot (the 2 hr rule), no leftovers older than 3 days, and restuarant guidelines or Transplant - the gift of life    The spouse and patient asked appropriate questions and verbalized understanding of the material covered.    Patient received discharge bag including Emilio Math, RN's business card, a water bottle, face masks, urinal, daily logs, Transplant Team contact sheet, and [employer] pin and [facility] for Transplant Care pen.  Caryl Ada 02/11/2022 10:34 AM

## 2022-02-11 NOTE — Unmapped (Signed)
Standing LC orders

## 2022-02-11 NOTE — Unmapped (Signed)
Patient arrived from [facility].  A&O x4.  VSs stable, pain adequately managed.  Foley remains in place d/t failed TOV yesterday.  Patient reports no BM and small amount flatus since PTA, but enteric precautions order remains active, MDs aware.  Tolerating solid food, no c/o nausea/vomiting.  Surgical incision draining small amount SS drainage, dry gauze dressings replaced.  Remains free of falls/injury.       Problem: Adult Inpatient Plan of Care  Goal: Plan of Care Review  Outcome: Progressing  Goal: Patient-Specific Goal (Individualized)  Description: Patient will be able to have bowel movement by day shift on 02/11/2022.   Outcome: Progressing  Goal: Absence of Hospital-Acquired Illness or Injury  Outcome: Progressing  Goal: Optimal Comfort and Wellbeing  Outcome: Progressing  Goal: Readiness for Transition of Care  Outcome: Progressing  Goal: Rounds/Family Conference  Outcome: Progressing     Problem: Infection  Goal: Absence of Infection Signs and Symptoms  Outcome: Progressing     Problem: Impaired Wound Healing  Goal: Optimal Wound Healing  Outcome: Progressing     Problem: Skin Injury Risk Increased  Goal: Skin Health and Integrity  Outcome: Progressing     Problem: Self-Care Deficit  Goal: Improved Ability to Complete Activities of Daily Living  Outcome: Progressing     Problem: Fall Injury Risk  Goal: Absence of Fall and Fall-Related Injury  Outcome: Progressing

## 2022-02-11 NOTE — Unmapped (Signed)
Transplant Surgery Progress Note    Date of service: 02/11/2022    Hospital Day:  LOS: 5 days   Surgery Date(s): 02/06/22    Interval History: NAEON. Afebrile and stable on RA. Pain controlled. Did not work with PT/OT yesterday. Tolerating PO and had BM this AM.    Assessment/Plan:  Matthew Key is a 61 y.o. 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     Neurological:    - MMPC    Cardiovascular:   *MAP goal >65  - CTM; floor with tele    Pulmonary:   *Extubated on 02/07/22, now on room air  - Pulm toilet/OOB  - IS to 1500 today    Renal/Genitourinary:  *Strict I&Os  - Trend BUN/Cr  - Failed TOV 6/30 and replaced foley 7/1  - Repeat TOV on 02/12/22    GI/Nutrition:  F: ML  E: Replete PRN  N: regular diet    *S/p OLT on 02/06/22  - q12 labs   - Liver transplant doppler: WNL  - Surgical drains to bulb suction x2  - 25% albumin BID    Heme:   DVT ppx: SQH BID today  - Trend PLT    Induction immunosuppression: methylpred and basiliximab  Maintenance immunosuppression: MMF and tac    ID:  - Zosyn x5 days (ends today)    PT/OT consults today    Dispo: Floor    Objective:    Physical Exam:    General: NAD  CV: Regular rate per telemetry  Resp: NWOB on room air  Abdomen: Soft, mildly distended, appropriately tender, surgical drains w/ serosanguionous output x2. Mercedes incision clean and intact, small amount of serosanguinous drainage on dressings  Extremities: WWP    Data Review:   I have reviewed the labs and studies from the last 24 hours.    Vitals Reviewed:    Temp:  [36.6 ??C (97.9 ??F)-37 ??C (98.6 ??F)] 36.6 ??C (97.9 ??F)  Heart Rate:  [67-97] 95  SpO2 Pulse:  [84-90] 84  Resp:  [15-18] 16  BP: (118-156)/(66-79) 131/67  MAP (mmHg):  [79-102] 85  SpO2:  [94 %-98 %] 98 %   Temp (24hrs), Avg:36.8 ??C (98.3 ??F), Min:36.6 ??C (97.9 ??F), Max:37 ??C (98.6 ??F)     SpO2: 98 %   Height: 172.7 cm (5' 7.99) Weight: (!) 105.7 kg (233 lb 0.4 oz)    Body mass index is 35.44 kg/m??.    Body surface area is 2.25 meters squared.       Intake/Output Summary (Last 24 hours) at 02/11/2022 0911  Last data filed at 02/11/2022 0800  Gross per 24 hour   Intake 1650 ml   Output 14782 ml   Net -9225 ml        I/O last 3 completed shifts:  In: 1850 [P.O.:1220; I.V.:30; IV Piggyback:600]  Out: 95621 [HYQMV:78469; Drains:1555]   I/O this shift:  In: 750 [P.O.:750]  Out: 1780 [Urine:1600; Drains:180]        Tubes and Drains:  Patient Lines/Drains/Airways Status       Active Active Lines, Drains, & Airways       Name Placement date Placement time Site Days    Closed/Suction Drain 1 Anterior RLQ Bulb 10 Fr. 02/06/22  1052  RLQ  4    Closed/Suction Drain 2 Anterior;Lateral RLQ Bulb 10 Fr. 02/06/22  1150  RLQ  4    Urethral Catheter 02/09/22  1000  --  1    Peripheral IV 02/10/22 Anterior;Right Forearm 02/10/22  1401  Forearm  less than 1                    Idalia Needle, MD  PGY-3 General Surgery

## 2022-02-11 NOTE — Unmapped (Signed)
Transferred out of ICU. A&Ox 4 asks appropriate questions regarding care and medications. Large volume urine output  through foley catheter. No BM since pre op. Reports flatus. Productive cough with yankauer at bedside. Abdomen slightly distended and apex of incision covered with dry gauze for scant drainage. JP drains x 2 serosanguinous output. Medicated with oxycodone once for incisional pain. Daily labs monitored.  Problem: Adult Inpatient Plan of Care  Goal: Plan of Care Review  Outcome: Progressing  Goal: Patient-Specific Goal (Individualized)  Description: Patient will be able to have bowel movement by day shift on 02/11/2022.   Outcome: Progressing  Goal: Absence of Hospital-Acquired Illness or Injury  Outcome: Progressing  Intervention: Identify and Manage Fall Risk  Recent Flowsheet Documentation  Taken 02/11/2022 0400 by Sherril Cong, RN  Safety Interventions:   low bed   fall reduction program maintained   nonskid shoes/slippers when out of bed   isolation precautions   neutropenic precautions  Taken 02/11/2022 0000 by Sherril Cong, RN  Safety Interventions:   low bed   no IV/BP/blood draw right arm   neutropenic precautions   isolation precautions   fall reduction program maintained  Taken 02/10/2022 2200 by Sherril Cong, RN  Safety Interventions:   low bed   fall reduction program maintained   isolation precautions   neutropenic precautions   nonskid shoes/slippers when out of bed  Taken 02/10/2022 2000 by Sherril Cong, RN  Safety Interventions:   low bed   fall reduction program maintained   isolation precautions   neutropenic precautions   nonskid shoes/slippers when out of bed  Intervention: Prevent and Manage VTE (Venous Thromboembolism) Risk  Recent Flowsheet Documentation  Taken 02/11/2022 0400 by Sherril Cong, RN  Activity Management: activity adjusted per tolerance  Taken 02/11/2022 0000 by Sherril Cong, RN  Activity Management:   activity adjusted per tolerance   activity encouraged  Taken 02/10/2022 2200 by Sherril Cong, RN  Activity Management: activity adjusted per tolerance  Taken 02/10/2022 2000 by Sherril Cong, RN  Activity Management: activity adjusted per tolerance  VTE Prevention/Management:   ambulation promoted   anticoagulant therapy  Goal: Optimal Comfort and Wellbeing  Outcome: Progressing  Goal: Readiness for Transition of Care  Outcome: Progressing  Goal: Rounds/Family Conference  Outcome: Progressing     Problem: Infection  Goal: Absence of Infection Signs and Symptoms  Outcome: Progressing  Intervention: Prevent or Manage Infection  Recent Flowsheet Documentation  Taken 02/10/2022 2200 by Sherril Cong, RN  Isolation Precautions:   enteric precautions maintained   protective precautions maintained  Taken 02/10/2022 2000 by Sherril Cong, RN  Isolation Precautions:   protective precautions maintained   enteric precautions maintained     Problem: Impaired Wound Healing  Goal: Optimal Wound Healing  Outcome: Progressing  Intervention: Promote Wound Healing  Recent Flowsheet Documentation  Taken 02/11/2022 0400 by Sherril Cong, RN  Activity Management: activity adjusted per tolerance  Taken 02/11/2022 0000 by Sherril Cong, RN  Activity Management:   activity adjusted per tolerance   activity encouraged  Taken 02/10/2022 2200 by Sherril Cong, RN  Activity Management: activity adjusted per tolerance  Taken 02/10/2022 2000 by Sherril Cong, RN  Activity Management: activity adjusted per tolerance     Problem: Skin Injury Risk Increased  Goal: Skin Health and Integrity  Outcome: Progressing  Intervention: Optimize Skin Protection  Recent Flowsheet Documentation  Taken 02/11/2022 0400 by Sherril Cong, RN  Pressure Reduction Techniques: frequent weight shift encouraged  Pressure Reduction Devices: pressure-redistributing mattress utilized  Taken 02/11/2022 0000 by Sherril Cong, RN  Pressure Reduction Techniques: frequent weight shift encouraged  Pressure Reduction Devices: pressure-redistributing mattress utilized  Taken 02/10/2022 2200 by Sherril Cong, RN  Pressure Reduction Techniques: frequent weight shift encouraged  Pressure Reduction Devices: pressure-redistributing mattress utilized  Taken 02/10/2022 2000 by Sherril Cong, RN  Pressure Reduction Techniques: frequent weight shift encouraged  Pressure Reduction Devices: pressure-redistributing mattress utilized     Problem: Self-Care Deficit  Goal: Improved Ability to Complete Activities of Daily Living  Outcome: Progressing  Intervention: Promote Activity and Functional Independence  Recent Flowsheet Documentation  Taken 02/10/2022 2000 by Sherril Cong, RN  Self-Care Promotion: BADL personal objects within reach     Problem: Fall Injury Risk  Goal: Absence of Fall and Fall-Related Injury  Outcome: Progressing  Intervention: Identify and Manage Contributors  Recent Flowsheet Documentation  Taken 02/10/2022 2000 by Sherril Cong, RN  Self-Care Promotion: BADL personal objects within reach  Intervention: Promote Injury-Free Environment  Recent Flowsheet Documentation  Taken 02/11/2022 0400 by Sherril Cong, RN  Safety Interventions:   low bed   fall reduction program maintained   nonskid shoes/slippers when out of bed   isolation precautions   neutropenic precautions  Taken 02/11/2022 0000 by Sherril Cong, RN  Safety Interventions:   low bed   no IV/BP/blood draw right arm   neutropenic precautions   isolation precautions   fall reduction program maintained  Taken 02/10/2022 2200 by Sherril Cong, RN  Safety Interventions:   low bed   fall reduction program maintained   isolation precautions   neutropenic precautions   nonskid shoes/slippers when out of bed  Taken 02/10/2022 2000 by Sherril Cong, RN  Safety Interventions:   low bed   fall reduction program maintained   isolation precautions   neutropenic precautions   nonskid shoes/slippers when out of bed

## 2022-02-11 NOTE — Unmapped (Signed)
Endocrine Team Diabetes New Consult Note     Consult information:  Requesting Attending Physician : Particia Nearing, MD  Service Requesting Consult : Surg Transplant [phone])  Primary Care Provider: Loman Brooklyn, MD  Impression:  Matthew Key is a 61 y.o. male admitted for OLT. We have been consulted at the request of Particia Nearing, MD to evaluate Matthew Key for hyperglycemia.     Medical Decision Making:  Diagnoses:  1.Type 2 Diabetes. Uncontrolled With severe hyperglycemia last 24 hours.  2. Nutrition: Complicating glycemic control. Increasing risk for both hypoglycemia and hyperglycemia.  3. Obesity. Complicating glycemic control and increasing risk for hyperglycemia.  4. Transplant. Complicating glycemic control and increasing risk for hyperglycemia.  5. Steroids. Complicating glycemic control and increasing risk for hyperglycemia.  6. History of secondary adrenal insufficiency.      Studies reviewed 02/11/22:  Labs: CBC, BMP, POCT-BG, and HbA1C  Interpretation: WBC normal. Electrolytes normal. Cr elevated with recent h/o AKI, but trending lower. Severe hyperglycemia complicated by steroids. A1C returned in normal range, but false due to recent blood transfusions.     Notes reviewed: Primary team and nursing notes      Overall impression based on above reviews and history:  Hyperglycemic. Will place on weight based basal insulin, add nutritional coverage and revise correction to lispro. Monitoring closely as steroids taper.     Recommendations:  Type 2 Diabetes:  - NPH 13u q12h  - lispro 7u qAC  - lispro target 140, ISF 30 achs  - Hypoglycemia protocol.  - POCT-BG achs.  - Ensure patient is on glucose precautions if patient taking nutrition by mouth.     Discharge planning:  In process. Will complete closer to discharge.      2. History of Secondary Adrenal Insufficiency:  -Normal HPA axis noted in January 2023. Off steroids since. Continue taper as scheduled.    Thank you for this consult. Discussed plan with primary team. We will continue to follow and make recommendations and place orders as appropriate.    Please page with questions or concerns: Denisha Hoel, NP: [phone]  DCT on call from 6AM - 3PM on weekdays then endocrine fellow on call: [phone] from 3PM - 6AM on weekdays and on weekends and holidays.   If APP cannot be reached, please page the endocrine fellow on call.      Subjective:  Initial HPI:  Matthew Key is a 61 y.o. 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     Endocrine History: History of secondary adrenal insufficiency. Had been receiving steroids steroids for spinal injections. We saw back in January of this year and noted HPA axis recovery based on ACTH stim test (cortisol went from 8.4-> 22.9). Since then he was prescribed hydrocortisone 10mg  every day prn illness, but states he hasn't taken any.    Diabetes History:  Patient has a history of Type 2 diabetes diagnosed around 2017.  Diabetes is managed by: PCP  Current home diabetes regimen: tresiba 30 units at bedtime. He was on ozempic 1mg  qweek, but was stopped on 6/19 due significant hypotension (DBP in the 30's per pt). He was admitted and it was noted ozempic was held for AKI.   Current home blood glucose monitoring:   1-2 times a day.  Hypoglycemia awareness: yes.  Complications related to diabetes: unknown. Hx of AKI  earlier this month. Initially thought to be due to HRS or ATN, but later thought to be pre-renal.       Current Nutrition:  Active Orders   Diet    Nutrition Therapy Regular/House; No Carbonation       ROS: As per HPI.     furosemide  60 mg Intravenous BID    gabapentin  300 mg Oral Nightly    insulin NPH  12 Units Subcutaneous Q12H    insulin regular  0-20 Units Subcutaneous 4xd Meals & HS    melatonin  3 mg Oral QPM    methocarbamoL  500 mg Oral QID    methylPREDNISolone sodium succinate  20 mg Intravenous Q12H    Followed by    Melene Muller ON 02/12/2022] predniSONE  20 mg Oral Daily    Followed by    Melene Muller ON 02/13/2022] predniSONE  15 mg Oral Daily    Followed by    Melene Muller ON 02/14/2022] predniSONE  10 mg Oral Daily    Followed by    Melene Muller ON 02/15/2022] predniSONE  5 mg Oral Daily    Followed by    Melene Muller ON 02/21/2022] predniSONE  2.5 mg Oral Daily    mycophenolate  1,000 mg Oral BID    nystatin  10 mL Oral Q8H SCH    pantoprazole  40 mg Oral Daily    piperacillin-tazobactam  3.375 g Intravenous Q6H    polyethylene glycol  17 g Oral BID    sulfamethoxazole-trimethoprim  1 tablet Oral Once per day on Mon Wed Fri    tacrolimus  6 mg Oral BID    tamsulosin  0.4 mg Oral Nightly    valGANciclovir  450 mg Oral Every other day       Current Outpatient Medications   Medication Instructions    acetaminophen (TYLENOL) 650 mg, Oral, Every 6 hours PRN    atorvastatin (LIPITOR) 10 mg, Oral, Nightly    ciprofloxacin HCl (CIPRO) 500 MG tablet TAKE 1 TABLET BY MOUTH IN THE MORNING    furosemide (LASIX) 40 mg, Oral, Daily (standard), Restart on 6/23    gabapentin (NEURONTIN) 300 mg, Oral, At bedtime    hydrOXYzine (VISTARIL) 50 mg, Oral, Nightly PRN    insulin syringe-needle U-100 1 mL 31 gauge x 5/16 (8 mm) Syrg Use once as needed for Hydrocortisone injection.Marland Kitchen    lactulose 20 g, Oral, 3 times a day (standard)    ondansetron (ZOFRAN-ODT) 4 mg, Oral, Every 8 hours PRN    pantoprazole (PROTONIX) 40 mg, Oral, Daily (standard)    pen needle, diabetic (BD ULTRA-FINE NANO PEN NEEDLE) 32 gauge x 5/32 (4 mm) Ndle ok to sub any brand or size needle preferred by insurance/patient, use 1-2x/day, dx E11.65    sodium chloride (AYR) 0.65 % Drop 1 spray, Every 4 hours PRN    spironolactone (ALDACTONE) 50 mg, Oral, Daily (standard), Restart 6/23    syringe with needle 3 mL 23 x 1 Syrg Use as needed for Hydrocortisone injection    TRESIBA FLEXTOUCH U-100 30 Units, Subcutaneous, At bedtime, Adjust as instructed.    zinc sulfate 110 mg, Oral, Daily           Past Medical History:   Diagnosis Date    AKI (acute kidney injury) (CMS-HCC) 12/14/2020    Anxiety 10/22/2013    Arthritis     Cervical radiculopathy 12/03/2016    Chronic pain disorder     Lower back    Cirrhosis (CMS-HCC)  Dental abscess 10/2020    Duodenal ulcer 12/01/2017    GERD (gastroesophageal reflux disease)     History of transfusion     Hyperlipidemia 10/22/2013    Hypertension     under control with meds and weight loss    Hypotension 01/29/2022    Liver disease     Sleep apnea, obstructive     Have machine    Stroke (CMS-HCC)     mild stroke    Type 2 diabetes mellitus with diabetic neuropathy, with long-term current use of insulin (CMS-HCC) 06/09/2014       Past Surgical History:   Procedure Laterality Date    KNEE SURGERY      PR CATH PLACE/CORON ANGIO, IMG SUPER/INTERP,R&L HRT CATH, L HRT VENTRIC N/A 12/19/2020    Procedure: CATH LEFT/RIGHT HEART CATHETERIZATION;  Surgeon: Rosana Hoes, MD;  Location: [facility] CATH;  Service: Cardiology    PR COLONOSCOPY FLX DX W/COLLJ SPEC WHEN PFRMD N/A 12/07/2021    Procedure: COLONOSCOPY, FLEXIBLE, PROXIMAL TO SPLENIC FLEXURE; DIAGNOSTIC, W/WO COLLECTION SPECIMEN BY BRUSH OR WASH;  Surgeon: Annie Paras, MD;  [facility];  Service: Gastroenterology    PR TRANSPLANT LIVER,ALLOTRANSPLANT N/A 02/06/2022    Procedure: LIVER ALLOTRANSPLANTATION; ORTHOTOPIC, PARTIAL OR WHOLE, FROM CADAVER OR LIVING DONOR, ANY AGE;  Surgeon: Florene Glen, MD;  Location: MAIN OR [place];  Service: Transplant    PR TRANSPLANT,PREP DONOR LIVER/ARTERIAL N/A 02/06/2022    Procedure: BACKBNCH RECONSTRUCT OF CAD/LIVE DONOR LIVER GFT PRIOR TRANSPLANT; ARTERIAL ANASTAMOSIS, EA;  Surgeon: Florene Glen, MD;  Location: MAIN OR [place];  Service: Transplant    PR UPPER GI ENDOSCOPY,BIOPSY N/A 10/12/2020    Procedure: UGI ENDOSCOPY; WITH BIOPSY, SINGLE OR MULTIPLE;  Surgeon: Marene Lenz, MD;  [facility];  Service: Gastroenterology    PR UPPER GI ENDOSCOPY,DIAGNOSIS N/A 01/03/2022    Procedure: UGI ENDO, INCLUDE ESOPHAGUS, STOMACH, & DUODENUM &/OR JEJUNUM; DX W/WO COLLECTION SPECIMN, BY BRUSH OR WASH;  Surgeon: Marene Lenz, MD;  [facility]: [facility];  Service: Gastroenterology    PR UPPER GI ENDOSCOPY,LIGAT VARIX N/A 12/07/2021    Procedure: UGI ENDO; Everlene Balls LIG ESOPH &/OR GASTRIC VARICES;  Surgeon: Annie Paras, MD;  [facility]: [facility];  Service: Gastroenterology    ROOT CANAL      Front teeth       Family History   Problem Relation Age of Onset    Edema Mother     Alzheimer's disease Father     Aortic dissection Brother     Early death Brother     Aneurysm Brother        Social History     Tobacco Use    Smoking status: Never    Smokeless tobacco: Never   Vaping Use    Vaping Use: Never used   Substance Use Topics    Alcohol use: Not Currently    Drug use: Not Currently       OBJECTIVE:  BP 121/66  - Pulse 67  - Temp 36.7 ??C (98.1 ??F) (Oral)  - Resp 18  - Ht 172.7 cm (5' 7.99)  - Wt (!) 105.7 kg (233 lb 0.4 oz)  - SpO2 94%  - BMI 35.44 kg/m??   Wt Readings from Last 12 Encounters:   02/10/22 (!) 105.7 kg (233 lb 0.4 oz)   01/29/22 96.9 kg (213 lb 11.2 oz)   01/14/22 89.8 kg (198 lb)   01/03/22  88.9 kg (196 lb)   12/31/21 90.4 kg (199 lb 6.4 oz)   12/11/21 90 kg (198 lb 7.1 oz)   12/07/21 89.8 kg (198 lb)   11/26/21 92.1 kg (203 lb)   10/23/21 100.7 kg (222 lb 0.1 oz)   10/08/21 90 kg (198 lb 8 oz)   10/01/21 92.4 kg (203 lb 12.8 oz)   09/10/21 94.3 kg (208 lb)     Physical Exam  Constitutional:       Appearance: He is obese. He is not ill-appearing.   Pulmonary:      Effort: Pulmonary effort is normal. No respiratory distress.   Skin:     General: Skin is warm and dry.      Coloration: Skin is jaundiced.   Neurological:      General: No focal deficit present.      Mental Status: He is alert and oriented to person, place, and time. BG/insulin reviewed per EMR.   Glucose, POC (mg/dL)   Date Value   29/56/2130 186 (H)   02/10/2022 310 (H)   02/10/2022 282 (H)   02/10/2022 268 (H)   02/10/2022 143   02/09/2022 203 (H)   02/09/2022 279 (H)   02/09/2022 200 (H)        Summary of labs:  Lab Results   Component Value Date    A1C 5.2 02/05/2022    A1C 4.7 (L) 01/14/2022    A1C 4.6 (L) 12/11/2021     Lab Results   Component Value Date    CREATININE 1.82 (H) 02/10/2022     Lab Results   Component Value Date    WBC 6.4 02/10/2022    HGB 9.6 (L) 02/10/2022    HCT 27.0 (L) 02/10/2022    PLT 42 (L) 02/10/2022       Lab Results   Component Value Date    NA 139 02/10/2022    K 3.8 02/10/2022    CL 105 02/10/2022    CO2 28.0 02/10/2022    BUN 38 (H) 02/10/2022    CREATININE 1.82 (H) 02/10/2022    GLU 237 (H) 02/10/2022    CALCIUM 8.8 02/10/2022    MG 1.6 02/10/2022    PHOS 3.7 02/10/2022       Lab Results   Component Value Date    BILITOT 0.9 02/10/2022    BILIDIR 0.60 (H) 02/10/2022    PROT 5.3 (L) 02/10/2022    ALBUMIN 3.6 02/10/2022    ALT 157 (H) 02/10/2022    AST 53 (H) 02/10/2022    ALKPHOS 102 02/10/2022    GGT 281 (H) 02/10/2022

## 2022-02-12 LAB — CBC
HEMATOCRIT: 25.4 % — ABNORMAL LOW (ref 39.0–48.0)
HEMATOCRIT: 21.7 % — ABNORMAL LOW (ref 39.0–48.0)
HEMOGLOBIN: 9 g/dL — ABNORMAL LOW (ref 12.9–16.5)
HEMOGLOBIN: 7.9 g/dL — ABNORMAL LOW (ref 12.9–16.5)
MEAN CORPUSCULAR HEMOGLOBIN CONC: 35.4 g/dL (ref 32.0–36.0)
MEAN CORPUSCULAR HEMOGLOBIN CONC: 36.2 g/dL — ABNORMAL HIGH (ref 32.0–36.0)
MEAN CORPUSCULAR HEMOGLOBIN: 33.5 pg — ABNORMAL HIGH (ref 25.9–32.4)
MEAN CORPUSCULAR HEMOGLOBIN: 34.3 pg — ABNORMAL HIGH (ref 25.9–32.4)
MEAN CORPUSCULAR VOLUME: 94.6 fL (ref 77.6–95.7)
MEAN CORPUSCULAR VOLUME: 94.6 fL (ref 77.6–95.7)
MEAN PLATELET VOLUME: 11.4 fL — ABNORMAL HIGH (ref 6.8–10.7)
MEAN PLATELET VOLUME: 11.7 fL — ABNORMAL HIGH (ref 6.8–10.7)
PLATELET COUNT: 83 10*9/L — ABNORMAL LOW (ref 150–450)
PLATELET COUNT: 84 10*9/L — ABNORMAL LOW (ref 150–450)
RED BLOOD CELL COUNT: 2.68 10*12/L — ABNORMAL LOW (ref 4.26–5.60)
RED BLOOD CELL COUNT: 2.3 10*12/L — ABNORMAL LOW (ref 4.26–5.60)
RED CELL DISTRIBUTION WIDTH: 17.2 % — ABNORMAL HIGH (ref 12.2–15.2)
RED CELL DISTRIBUTION WIDTH: 18 % — ABNORMAL HIGH (ref 12.2–15.2)
WBC ADJUSTED: 9.6 10*9/L (ref 3.6–11.2)
WBC ADJUSTED: 7.1 10*9/L (ref 3.6–11.2)

## 2022-02-12 LAB — BASIC METABOLIC PANEL
ANION GAP: 6 mmol/L (ref 5–14)
BLOOD UREA NITROGEN: 42 mg/dL — ABNORMAL HIGH (ref 9–23)
BUN / CREAT RATIO: 23
CALCIUM: 9 mg/dL (ref 8.7–10.4)
CHLORIDE: 100 mmol/L (ref 98–107)
CO2: 31 mmol/L (ref 20.0–31.0)
CREATININE: 1.83 mg/dL — ABNORMAL HIGH
EGFR CKD-EPI (2021) MALE: 41 mL/min/{1.73_m2} — ABNORMAL LOW (ref >=60)
GLUCOSE RANDOM: 251 mg/dL — ABNORMAL HIGH (ref 70–179)
POTASSIUM: 4.1 mmol/L (ref 3.4–4.8)
SODIUM: 137 mmol/L (ref 135–145)

## 2022-02-12 LAB — TACROLIMUS LEVEL, TROUGH: TACROLIMUS, TROUGH: 2.6 ng/mL — ABNORMAL LOW (ref 5.0–15.0)

## 2022-02-12 LAB — HEPATIC FUNCTION PANEL
ALBUMIN: 3.4 g/dL (ref 3.4–5.0)
ALKALINE PHOSPHATASE: 90 U/L (ref 46–116)
ALT (SGPT): 119 U/L — ABNORMAL HIGH (ref 10–49)
AST (SGOT): 23 U/L (ref <=34)
BILIRUBIN DIRECT: 0.4 mg/dL — ABNORMAL HIGH (ref 0.00–0.30)
BILIRUBIN TOTAL: 0.7 mg/dL (ref 0.3–1.2)
PROTEIN TOTAL: 5.3 g/dL — ABNORMAL LOW (ref 5.7–8.2)

## 2022-02-12 LAB — GAMMA GT: GAMMA GLUTAMYL TRANSFERASE: 216 U/L — ABNORMAL HIGH

## 2022-02-12 LAB — PROTIME-INR
INR: 1.14
PROTIME: 12.9 s — ABNORMAL HIGH (ref 9.8–12.8)

## 2022-02-12 LAB — PHOSPHORUS: PHOSPHORUS: 4.7 mg/dL (ref 2.4–5.1)

## 2022-02-12 LAB — MAGNESIUM: MAGNESIUM: 1.4 mg/dL — ABNORMAL LOW (ref 1.6–2.6)

## 2022-02-12 MED ADMIN — gabapentin (NEURONTIN) capsule 300 mg: 300 mg | ORAL | @ 02:00:00

## 2022-02-12 MED ADMIN — insulin lispro (HumaLOG) injection 0-20 Units: 0-20 [IU] | SUBCUTANEOUS | @ 02:00:00

## 2022-02-12 MED ADMIN — oxyCODONE (ROXICODONE) immediate release tablet 10 mg: 10 mg | ORAL | @ 09:00:00 | Stop: 2022-02-25

## 2022-02-12 MED ADMIN — melatonin tablet 3 mg: 3 mg | ORAL | @ 02:00:00

## 2022-02-12 MED ADMIN — tacrolimus (PROGRAF) 8 mg combo product: 8 mg | ORAL | @ 23:00:00

## 2022-02-12 MED ADMIN — polyethylene glycol (MIRALAX) packet 17 g: 17 g | ORAL | @ 02:00:00

## 2022-02-12 MED ADMIN — valGANciclovir (VALCYTE) tablet 450 mg: 450 mg | ORAL | @ 13:00:00

## 2022-02-12 MED ADMIN — oxyCODONE (ROXICODONE) immediate release tablet 10 mg: 10 mg | ORAL | @ 02:00:00 | Stop: 2022-02-25

## 2022-02-12 MED ADMIN — oxyCODONE (ROXICODONE) immediate release tablet 10 mg: 10 mg | ORAL | @ 13:00:00 | Stop: 2022-02-25

## 2022-02-12 MED ADMIN — polyethylene glycol (MIRALAX) packet 17 g: 17 g | ORAL | @ 13:00:00

## 2022-02-12 MED ADMIN — methocarbamoL (ROBAXIN) tablet 500 mg: 500 mg | ORAL | @ 02:00:00

## 2022-02-12 MED ADMIN — methocarbamoL (ROBAXIN) tablet 500 mg: 500 mg | ORAL | @ 16:00:00

## 2022-02-12 MED ADMIN — tamsulosin (FLOMAX) 24 hr capsule 0.4 mg: .4 mg | ORAL | @ 02:00:00

## 2022-02-12 MED ADMIN — heparin (porcine) 5,000 unit/mL injection 5,000 Units: 5000 [IU] | SUBCUTANEOUS | @ 13:00:00 | Stop: 2022-02-12

## 2022-02-12 MED ADMIN — mycophenolate (CELLCEPT) tablet 1,000 mg: 1000 mg | ORAL | @ 13:00:00

## 2022-02-12 MED ADMIN — tacrolimus (PROGRAF) 6 mg combo product: 6 mg | ORAL | @ 09:00:00 | Stop: 2022-02-12

## 2022-02-12 MED ADMIN — insulin NPH (HumuLIN,NovoLIN) injection 16 Units: 16 [IU] | SUBCUTANEOUS | @ 13:00:00

## 2022-02-12 MED ADMIN — insulin lispro (HumaLOG) injection 0-20 Units: 0-20 [IU] | SUBCUTANEOUS | @ 10:00:00

## 2022-02-12 MED ADMIN — acetaminophen (TYLENOL) tablet 650 mg: 650 mg | ORAL | @ 13:00:00

## 2022-02-12 MED ADMIN — heparin (porcine) 5,000 unit/mL injection 5,000 Units: 5000 [IU] | SUBCUTANEOUS | @ 02:00:00

## 2022-02-12 MED ADMIN — furosemide (LASIX) injection 60 mg: 60 mg | INTRAVENOUS | @ 09:00:00 | Stop: 2022-02-12

## 2022-02-12 MED ADMIN — insulin lispro (HumaLOG) injection 0-20 Units: 0-20 [IU] | SUBCUTANEOUS | @ 18:00:00

## 2022-02-12 MED ADMIN — nystatin (MYCOSTATIN) oral suspension: 10 mL | ORAL | @ 09:00:00

## 2022-02-12 MED ADMIN — methocarbamoL (ROBAXIN) tablet 500 mg: 500 mg | ORAL | @ 23:00:00

## 2022-02-12 MED ADMIN — melatonin tablet 3 mg: 3 mg | ORAL | @ 23:00:00

## 2022-02-12 MED ADMIN — methocarbamoL (ROBAXIN) tablet 500 mg: 500 mg | ORAL | @ 09:00:00

## 2022-02-12 MED ADMIN — nystatin (MYCOSTATIN) oral suspension: 10 mL | ORAL | @ 02:00:00

## 2022-02-12 MED ADMIN — insulin lispro (HumaLOG) injection 0-20 Units: 0-20 [IU] | SUBCUTANEOUS | @ 23:00:00

## 2022-02-12 MED ADMIN — pantoprazole (PROTONIX) EC tablet 40 mg: 40 mg | ORAL | @ 13:00:00

## 2022-02-12 MED ADMIN — nystatin (MYCOSTATIN) oral suspension: 10 mL | ORAL | @ 18:00:00

## 2022-02-12 MED ADMIN — methylPREDNISolone sodium succinate (PF) (Solu-MEDROL) injection 20 mg: 20 mg | INTRAVENOUS | @ 02:00:00 | Stop: 2022-02-11

## 2022-02-12 MED ADMIN — predniSONE (DELTASONE) tablet 20 mg: 20 mg | ORAL | @ 13:00:00 | Stop: 2022-02-12

## 2022-02-12 MED ADMIN — insulin lispro (HumaLOG) injection 10 Units: 10 [IU] | SUBCUTANEOUS | @ 18:00:00

## 2022-02-12 NOTE — Unmapped (Signed)
POC reviewed with the patient. PT S/P liver transplant. Patient is A/Ox4. NAD. Patient complaints of pain are well controlled by pain medication.  Patient remains afebrile and exhibits no signs and symptoms of infection. Patient has a foley and output has been good. Patient is tolerating diet. Patient is one assist when out of bed and has remained free from injury. Bed in low/locked position. Call bell in reach. Will Continue to monitor     Problem: Adult Inpatient Plan of Care  Goal: Plan of Care Review  Outcome: Progressing  Goal: Patient-Specific Goal (Individualized)  Description: Patient will be able to have bowel movement by day shift on 02/11/2022.   Outcome: Progressing  Goal: Absence of Hospital-Acquired Illness or Injury  Outcome: Progressing  Goal: Optimal Comfort and Wellbeing  Outcome: Progressing  Goal: Readiness for Transition of Care  Outcome: Progressing  Goal: Rounds/Family Conference  Outcome: Progressing     Problem: Infection  Goal: Absence of Infection Signs and Symptoms  Outcome: Progressing     Problem: Impaired Wound Healing  Goal: Optimal Wound Healing  Outcome: Progressing     Problem: Skin Injury Risk Increased  Goal: Skin Health and Integrity  Outcome: Progressing     Problem: Self-Care Deficit  Goal: Improved Ability to Complete Activities of Daily Living  Outcome: Progressing     Problem: Fall Injury Risk  Goal: Absence of Fall and Fall-Related Injury  Outcome: Progressing

## 2022-02-12 NOTE — Unmapped (Signed)
Patient A&O x4. Vital signs stable. Patient OOB as a standby/contact guard assist with a walker. Patient was up in the chair for the afternoon. Tolerating regular diet - no complaints of N/V. Adequate urine output via foley catheter. Three bowel movements this shift - his firsts since surgery - patient reports much relief with this. JP sites areleaky - dressing changed twice this shift. Patient was NPO for two hours prior to liver ultrasound. Because of this, patient did not require correctional insulin coverage this afternoon. Discussed with patient that his frequent/continuous drinking of Gatorade could be contributing to his elevated sugars. Patient agreeable to drinking Gatorade only with meals and trying to drink water for remainder of day to help keep blood sugars down. Patient reports pain 7-8/10 - received two doses 10 mg oxycodone and reports adequate relief. Patient's wife at bedside for duration of shift. Plan of care reviewed - patient and family with no further questions or concerns at this time.    Problem: Adult Inpatient Plan of Care  Goal: Plan of Care Review  Outcome: Ongoing - Unchanged  Goal: Patient-Specific Goal (Individualized)  Description: Patient will be able to have bowel movement by day shift on 02/11/2022.   Outcome: Ongoing - Unchanged  Goal: Absence of Hospital-Acquired Illness or Injury  Outcome: Ongoing - Unchanged  Intervention: Prevent Skin Injury  Recent Flowsheet Documentation  Taken 02/11/2022 0930 by Yevette Edwards, RN  Skin Protection:   adhesive use limited   transparent dressing maintained   tubing/devices free from skin contact   incontinence pads utilized  Intervention: Prevent and Manage VTE (Venous Thromboembolism) Risk  Recent Flowsheet Documentation  Taken 02/11/2022 1800 by Yevette Edwards, RN  Activity Management:   activity encouraged   activity adjusted per tolerance  Taken 02/11/2022 1600 by Yevette Edwards, RN  Activity Management:   activity encouraged   activity adjusted per tolerance  Taken 02/11/2022 1400 by Yevette Edwards, RN  Activity Management:   ambulated to bathroom   up in chair  Taken 02/11/2022 1200 by Yevette Edwards, RN  Activity Management: up in chair  Taken 02/11/2022 1000 by Yevette Edwards, RN  Activity Management: ambulated to bathroom  Taken 02/11/2022 0800 by Yevette Edwards, RN  Activity Management:   activity adjusted per tolerance   activity encouraged  Goal: Optimal Comfort and Wellbeing  Outcome: Ongoing - Unchanged  Goal: Readiness for Transition of Care  Outcome: Ongoing - Unchanged  Goal: Rounds/Family Conference  Outcome: Ongoing - Unchanged     Problem: Infection  Goal: Absence of Infection Signs and Symptoms  Outcome: Ongoing - Unchanged     Problem: Impaired Wound Healing  Goal: Optimal Wound Healing  Outcome: Ongoing - Unchanged  Intervention: Promote Wound Healing  Recent Flowsheet Documentation  Taken 02/11/2022 1800 by Yevette Edwards, RN  Activity Management:   activity encouraged   activity adjusted per tolerance  Taken 02/11/2022 1600 by Yevette Edwards, RN  Activity Management:   activity encouraged   activity adjusted per tolerance  Taken 02/11/2022 1400 by Yevette Edwards, RN  Activity Management:   ambulated to bathroom   up in chair  Taken 02/11/2022 1200 by Yevette Edwards, RN  Activity Management: up in chair  Taken 02/11/2022 1000 by Yevette Edwards, RN  Activity Management: ambulated to bathroom  Taken 02/11/2022 0800 by Yevette Edwards, RN  Activity Management:   activity adjusted per tolerance   activity encouraged     Problem:  Skin Injury Risk Increased  Goal: Skin Health and Integrity  Outcome: Ongoing - Unchanged  Intervention: Optimize Skin Protection  Recent Flowsheet Documentation  Taken 02/11/2022 0930 by Yevette Edwards, RN  Pressure Reduction Techniques: frequent weight shift encouraged  Pressure Reduction Devices: pressure-redistributing mattress utilized  Skin Protection:   adhesive use limited   transparent dressing maintained tubing/devices free from skin contact   incontinence pads utilized     Problem: Self-Care Deficit  Goal: Improved Ability to Complete Activities of Daily Living  Outcome: Ongoing - Unchanged     Problem: Fall Injury Risk  Goal: Absence of Fall and Fall-Related Injury  Outcome: Ongoing - Unchanged

## 2022-02-12 NOTE — Unmapped (Addendum)
A&O x4. Vitals stable, hypotensive throughout shift. Passed trial of void, LBM 7/4. Abdominal JP drains intact, sanguineous output. Staples intact, but MD paged due to upper incision leaking. Gauze and tape placed and replaced multiple times. MD aware, and ordered CBC, discontinued heparin and placed pressure dressing. Pt feels concerned about the amount of blood and infection. MD aware. Ambulates with SBA and walker, had one episode of light headedness this AM, patient aware to call out for assistance when ambulating. Pt emotional at the end of this shift, so RN encouraged patient to speak with chaplain. Will continue to monitor.     Problem: Adult Inpatient Plan of Care  Goal: Plan of Care Review  Outcome: Progressing  Goal: Patient-Specific Goal (Individualized)  Description: Patient will be able to have bowel movement by day shift on 02/11/2022.   Outcome: Progressing  Goal: Absence of Hospital-Acquired Illness or Injury  Outcome: Progressing  Intervention: Identify and Manage Fall Risk  Recent Flowsheet Documentation  Taken 02/12/2022 0815 by Sela Hua, RN  Safety Interventions:  ??? environmental modification  ??? fall reduction program maintained  ??? family at bedside  ??? lighting adjusted for tasks/safety  ??? low bed  ??? nonskid shoes/slippers when out of bed  ??? room near unit station  ??? supervised activity  Intervention: Prevent Skin Injury  Recent Flowsheet Documentation  Taken 02/12/2022 0815 by Sela Hua, RN  Skin Protection:  ??? adhesive use limited  ??? incontinence pads utilized  Intervention: Prevent and Manage VTE (Venous Thromboembolism) Risk  Recent Flowsheet Documentation  Taken 02/12/2022 0815 by Sela Hua, RN  Activity Management:  ??? ambulated to bathroom  ??? back to bed  Intervention: Prevent Infection  Recent Flowsheet Documentation  Taken 02/12/2022 0815 by Sela Hua, RN  Infection Prevention:  ??? hand hygiene promoted  ??? environmental surveillance performed  ??? single patient room provided  Goal: Optimal Comfort and Wellbeing  Outcome: Progressing  Goal: Readiness for Transition of Care  Outcome: Progressing  Goal: Rounds/Family Conference  Outcome: Progressing     Problem: Infection  Goal: Absence of Infection Signs and Symptoms  Outcome: Progressing  Intervention: Prevent or Manage Infection  Recent Flowsheet Documentation  Taken 02/12/2022 0815 by Sela Hua, RN  Infection Management: aseptic technique maintained  Isolation Precautions: protective precautions maintained     Problem: Impaired Wound Healing  Goal: Optimal Wound Healing  Outcome: Progressing  Intervention: Promote Wound Healing  Recent Flowsheet Documentation  Taken 02/12/2022 0815 by Sela Hua, RN  Activity Management:  ??? ambulated to bathroom  ??? back to bed     Problem: Skin Injury Risk Increased  Goal: Skin Health and Integrity  Outcome: Progressing  Intervention: Optimize Skin Protection  Recent Flowsheet Documentation  Taken 02/12/2022 0815 by Sela Hua, RN  Pressure Reduction Techniques: frequent weight shift encouraged  Pressure Reduction Devices: pressure-redistributing mattress utilized  Skin Protection:  ??? adhesive use limited  ??? incontinence pads utilized     Problem: Self-Care Deficit  Goal: Improved Ability to Complete Activities of Daily Living  Outcome: Progressing     Problem: Fall Injury Risk  Goal: Absence of Fall and Fall-Related Injury  Outcome: Progressing  Intervention: Promote Injury-Free Environment  Recent Flowsheet Documentation  Taken 02/12/2022 0815 by Sela Hua, RN  Safety Interventions:  ??? environmental modification  ??? fall reduction program maintained  ??? family at bedside  ??? lighting adjusted for tasks/safety  ??? low bed  ??? nonskid shoes/slippers when out  of bed  ??? room near unit station  ??? supervised activity

## 2022-02-12 NOTE — Unmapped (Signed)
Tacrolimus Therapeutic Monitoring Pharmacy Note    Matthew Key is a 61 y.o. male starting tacrolimus.     Indication: Liver transplant     Date of Transplant:  02/06/22       Prior Dosing Information: Current regimen 6 mg BID      Goals:  Therapeutic Drug Levels  Tacrolimus trough goal: 8-10 ng/mL    Additional Clinical Monitoring/Outcomes  Monitor renal function (SCr and urine output) and liver function (LFTs)  Monitor for signs/symptoms of adverse events (e.g., hyperglycemia, hyperkalemia, hypomagnesemia, hypertension, headache, tremor)    Results:   Tacrolimus level:  2.6 ng/mL, drawn appropriately    Pharmacokinetic Considerations and Significant Drug Interactions:   Concurrent hepatotoxic medications: None identified  Concurrent CYP3A4 substrates/inhibitors: None identified  Concurrent nephrotoxic medications: None identified    Assessment/Plan:  Recommendedation(s)  Increase to tac 8 mg BID.    Follow-up  Daily tac levels  .   A pharmacist will continue to monitor and recommend levels as appropriate    Please page service pharmacist with questions/clarifications.    Riccardo Dubin, PharmD

## 2022-02-12 NOTE — Unmapped (Signed)
Transplant Surgery Progress Note    Date of service: 02/12/2022    Hospital Day:  LOS: 6 days   Surgery Date(s): 02/06/22    Interval History: NAEON. Afebrile and stable on RA. Pain controlled. 3x weekly with OT. Foley out this AM and voided large volume.    Assessment/Plan:  Matthew Key is a 61 y.o. 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     Neurological:    - MMPC    Cardiovascular:   - CTM; floor with tele    Pulmonary:   *Extubated on 02/07/22, now on room air  - Pulm toilet/OOB    Renal/Genitourinary:  *Strict I&Os  - Trend BUN/Cr  - Follow up TOV  - Flomax    GI/Nutrition:  F: ML  E: Replete PRN  N: regular diet    *S/p OLT on 02/06/22  - Daily labs  - Liver transplant doppler: WNL x2 (most recent 02/11/22)  - Surgical drains to bulb suction x2  - 25% albumin BID    Heme:   DVT ppx: SQH BID today  - Trend PLT    Induction immunosuppression: methylpred and basiliximab  Maintenance immunosuppression: MMF and tac    ID:  - No abx indicated    PT consults today    Dispo: Floor    Objective:    Physical Exam:    General: NAD  CV: Regular rate per telemetry  Resp: NWOB on room air  Abdomen: Soft, mildly distended, appropriately tender, surgical drains w/ serosanguionous output x2. Mercedes incision clean and intact, small amount of serosanguinous drainage on dressings  Extremities: WWP    Data Review:   I have reviewed the labs and studies from the last 24 hours.    Vitals Reviewed:    Temp:  [36.6 ??C (97.9 ??F)-37 ??C (98.6 ??F)] 36.8 ??C (98.2 ??F)  Heart Rate:  [72-99] 79  Resp:  [16-18] 16  BP: (104-144)/(60-71) 104/60  MAP (mmHg):  [69-87] 69  SpO2:  [97 %-100 %] 98 %   Temp (24hrs), Avg:36.8 ??C (98.3 ??F), Min:36.6 ??C (97.9 ??F), Max:37 ??C (98.6 ??F)     SpO2: 98 %   Height: 172.7 cm (5' 7.99)    Weight: 94.8 kg (209 lb)    Body mass index is 31.79 kg/m??.    Body surface area is 2.13 meters squared.       Intake/Output Summary (Last 24 hours) at 02/12/2022 0928  Last data filed at 02/12/2022 0837  Gross per 24 hour   Intake 1140 ml   Output 6365 ml   Net -5225 ml        I/O last 3 completed shifts:  In: 2130 [P.O.:1930; IV Piggyback:200]  Out: 16109 [Urine:11250; Drains:1395]   I/O this shift:  In: 200 [P.O.:200]  Out: 675 [Urine:475; Drains:200]      Tubes and Drains:  Patient Lines/Drains/Airways Status     Active Active Lines, Drains, & Airways     Name Placement date Placement time Site Days    Closed/Suction Drain 1 Anterior RLQ Bulb 10 Fr. 02/06/22  1052  RLQ  5    Closed/Suction Drain 2 Anterior;Lateral RLQ Bulb 10 Fr. 02/06/22  1150  RLQ  5    Peripheral IV 02/10/22 Anterior;Right Forearm 02/10/22  1401  Forearm  1  Idalia Needle, MD  PGY-3 General Surgery

## 2022-02-12 NOTE — Unmapped (Signed)
Endocrine Team Diabetes Follow Up Consult Note     Consult information:  Requesting Attending Physician : Particia Nearing, MD  Service Requesting Consult : Surg Transplant [facility])  Primary Care Provider: Loman Brooklyn, MD  Impression:  Matthew Key is a 61 y.o. male admitted for OLT. We have been consulted at the request of Particia Nearing, MD to evaluate Matthew Key for hyperglycemia.     Medical Decision Making:  Diagnoses:  1.Type 2 Diabetes. Uncontrolled With severe hyperglycemia last 24 hours.  2. Nutrition: Complicating glycemic control. Increasing risk for both hypoglycemia and hyperglycemia.  3. Obesity. Complicating glycemic control and increasing risk for hyperglycemia.  4. Transplant. Complicating glycemic control and increasing risk for hyperglycemia.  5. Steroids. Complicating glycemic control and increasing risk for hyperglycemia.  6. History of secondary adrenal insufficiency.  7. Kidney disease. Complicating glycemic control and increasing risk for hypoglycemia.      Studies reviewed 02/12/22:  Labs: CBC, BMP, POCT-BG, and HbA1C  Interpretation: Anemia noted. Elevated creatinine with decreased GFR consistent with kidneydisease. Glucose trend revealed hyperglycemia, some severe.    Notes reviewed: Primary team and nursing notes      Overall impression based on above reviews and history:  Type 2 diabetes with severe hyperglycemia. Will increase basal and nutritional insulin. Steroid dose decreasing, but will still likely induce hyperglycemia.    Recommendations:  1. Type 2 Diabetes:  - NPH 16 units q12h  - lispro 10 AC  - lispro target 140, ISF 30 achs  - Hypoglycemia protocol.  - POCT-BG achs.  - Ensure patient is on glucose precautions if patient taking nutrition by mouth.     Discharge planning:  In process. Will complete closer to discharge.      2. History of Secondary Adrenal Insufficiency:  -Normal HPA axis noted in January 2023. Off steroids since. Continue taper as scheduled.    Thank you for this consult. Discussed plan with primary team. We will continue to follow and make recommendations and place orders as appropriate.    Please page with questions or concerns: April Goley, NP: [phone]  DCT on call from 6AM - 3PM on weekdays then endocrine fellow on call: [phone] from 3PM - 6AM on weekdays and on weekends and holidays.   If APP cannot be reached, please page the endocrine fellow on call.      Subjective:  Interval history:  The patient reports no acute complaints.    Initial HPI:  Matthew Key is a 61 y.o. 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     Endocrine History: History of secondary adrenal insufficiency. Had been receiving steroids steroids for spinal injections. We saw back in January of this year and noted HPA axis recovery based on ACTH stim test (cortisol went from 8.4-> 22.9). Since then he was prescribed hydrocortisone 10mg  every day prn illness, but states he hasn't taken any.    Diabetes History:  Patient has a history of Type 2 diabetes diagnosed around 2017.  Diabetes is managed by: PCP  Current home diabetes regimen: tresiba 30 units at bedtime. He was on ozempic 1mg  qweek, but was stopped on 6/19 due significant hypotension (DBP in the 30's per pt). He was admitted and it was noted ozempic was held for AKI.   Current home blood glucose monitoring:   1-2 times a day.  Hypoglycemia  awareness: yes.  Complications related to diabetes: unknown. Hx of AKI earlier this month. Initially thought to be due to HRS or ATN, but later thought to be pre-renal.       Current Nutrition:  Active Orders   Diet    Nutrition Therapy Regular/House; No Carbonation       ROS: As per HPI.    ??? furosemide  60 mg Intravenous BID   ??? gabapentin  300 mg Oral Nightly   ??? heparin (porcine) for subcutaneous use  5,000 Units Subcutaneous Q12H [facility]   ??? insulin lispro 0-20 Units Subcutaneous ACHS   ??? insulin lispro  7 Units Subcutaneous 3xd Meals   ??? insulin NPH  0.25 Units/kg/day Subcutaneous Q12H   ??? melatonin  3 mg Oral QPM   ??? methocarbamoL  500 mg Oral QID   ??? mycophenolate  1,000 mg Oral BID   ??? nystatin  10 mL Oral Q8H SCH   ??? pantoprazole  40 mg Oral Daily   ??? polyethylene glycol  17 g Oral BID   ??? predniSONE  20 mg Oral Daily    Followed by   ??? [START ON 02/13/2022] predniSONE  15 mg Oral Daily    Followed by   ??? [START ON 02/14/2022] predniSONE  10 mg Oral Daily    Followed by   ??? [START ON 02/15/2022] predniSONE  5 mg Oral Daily    Followed by   ??? [START ON 02/21/2022] predniSONE  2.5 mg Oral Daily   ??? sulfamethoxazole-trimethoprim  1 tablet Oral Once per day on Mon Wed Fri   ??? tacrolimus  6 mg Oral BID   ??? tamsulosin  0.4 mg Oral Nightly   ??? valGANciclovir  450 mg Oral Every other day       Current Outpatient Medications   Medication Instructions   ??? acetaminophen (TYLENOL) 650 mg, Oral, Every 6 hours PRN   ??? atorvastatin (LIPITOR) 10 mg, Oral, Nightly   ??? ciprofloxacin HCl (CIPRO) 500 MG tablet TAKE 1 TABLET BY MOUTH IN THE MORNING   ??? furosemide (LASIX) 40 mg, Oral, Daily (standard), Restart on 6/23   ??? gabapentin (NEURONTIN) 300 mg, Oral, At bedtime   ??? hydrOXYzine (VISTARIL) 50 mg, Oral, Nightly PRN   ??? insulin syringe-needle U-100 1 mL 31 gauge x 5/16 (8 mm) Syrg Use once as needed for Hydrocortisone injection.Marland Kitchen   ??? lactulose 20 g, Oral, 3 times a day (standard)   ??? mycophenolate (CELLCEPT) 1,000 mg, Oral, 2 times a day (standard)   ??? ondansetron (ZOFRAN-ODT) 4 mg, Oral, Every 8 hours PRN   ??? pantoprazole (PROTONIX) 40 mg, Oral, Daily (standard)   ??? pen needle, diabetic (BD ULTRA-FINE NANO PEN NEEDLE) 32 gauge x 5/32 (4 mm) Ndle ok to sub any brand or size needle preferred by insurance/patient, use 1-2x/day, dx E11.65   ??? sodium chloride (AYR) 0.65 % Drop 1 spray, Every 4 hours PRN   ??? spironolactone (ALDACTONE) 50 mg, Oral, Daily (standard), Restart 6/23   ??? syringe with needle 3 mL 23 x 1 Syrg Use as needed for Hydrocortisone injection   ??? tacrolimus (PROGRAF) 5 mg, Oral, 2 times a day   ??? TRESIBA FLEXTOUCH U-100 30 Units, Subcutaneous, At bedtime, Adjust as instructed.   ??? valGANciclovir (VALCYTE) 450 mg, Oral, Daily (standard)   ??? zinc sulfate 110 mg, Oral, Daily           Past Medical History:   Diagnosis Date   ??? AKI (acute kidney  injury) (CMS-HCC) 12/14/2020   ??? Anxiety 10/22/2013   ??? Arthritis    ??? Cervical radiculopathy 12/03/2016   ??? Chronic pain disorder     Lower back   ??? Cirrhosis (CMS-HCC)    ??? Dental abscess 10/2020   ??? Duodenal ulcer 12/01/2017   ??? GERD (gastroesophageal reflux disease)    ??? History of transfusion    ??? Hyperlipidemia 10/22/2013   ??? Hypertension     under control with meds and weight loss   ??? Hypotension 01/29/2022   ??? Liver disease    ??? Sleep apnea, obstructive     Have machine   ??? Stroke (CMS-HCC)     mild stroke   ??? Type 2 diabetes mellitus with diabetic neuropathy, with long-term current use of insulin (CMS-HCC) 06/09/2014       Past Surgical History:   Procedure Laterality Date   ??? KNEE SURGERY     ??? PR CATH PLACE/CORON ANGIO, IMG SUPER/INTERP,R&L HRT CATH, L HRT VENTRIC N/A 12/19/2020    Procedure: CATH LEFT/RIGHT HEART CATHETERIZATION;  Surgeon: Rosana Hoes, MD;  Location: [facility] CATH;  Service: Cardiology   ??? PR COLONOSCOPY FLX DX W/COLLJ SPEC WHEN PFRMD N/A 12/07/2021    Procedure: COLONOSCOPY, FLEXIBLE, PROXIMAL TO SPLENIC FLEXURE; DIAGNOSTIC, W/WO COLLECTION SPECIMEN BY BRUSH OR WASH;  Surgeon: Annie Paras, MD;  [facility]: [facility]URES [facility];  Service: Gastroenterology   ??? PR TRANSPLANT LIVER,ALLOTRANSPLANT N/A 02/06/2022    Procedure: LIVER ALLOTRANSPLANTATION; ORTHOTOPIC, PARTIAL OR WHOLE, FROM CADAVER OR LIVING DONOR, ANY AGE;  Surgeon: Florene Glen, MD;  Location: MAIN OR [facility];  Service: Transplant   ??? PR TRANSPLANT,PREP DONOR LIVER/ARTERIAL N/A 02/06/2022    Procedure: BACKBNCH RECONSTRUCT OF CAD/LIVE DONOR LIVER GFT PRIOR TRANSPLANT; ARTERIAL ANASTAMOSIS, EA;  Surgeon: Florene Glen, MD;  Location: MAIN OR [facility];  Service: Transplant   ??? PR UPPER GI ENDOSCOPY,BIOPSY N/A 10/12/2020    Procedure: UGI ENDOSCOPY; WITH BIOPSY, SINGLE OR MULTIPLE;  Surgeon: Marene Lenz, MD;  Location: [facility];  Service: Gastroenterology   ??? PR UPPER GI ENDOSCOPY,DIAGNOSIS N/A 01/03/2022    Procedure: UGI ENDO, INCLUDE ESOPHAGUS, STOMACH, & DUODENUM &/OR JEJUNUM; DX W/WO COLLECTION SPECIMN, BY BRUSH OR WASH;  Surgeon: Marene Lenz, MD;  Location: GI [facility]URES [facility];  Service: Gastroenterology   ??? PR UPPER GI ENDOSCOPY,LIGAT VARIX N/A 12/07/2021    Procedure: UGI ENDO; W/BAND LIG ESOPH &/OR GASTRIC VARICES;  Surgeon: Annie Paras, MD;  Location: GI [facility]URES [facility];  Service: Gastroenterology   ??? ROOT CANAL      Front teeth       Family History   Problem Relation Age of Onset   ??? Edema Mother    ??? Alzheimer's disease Father    ??? Aortic dissection Brother    ??? Early death Brother    ??? Aneurysm Brother        Social History     Tobacco Use   ??? Smoking status: Never   ??? Smokeless tobacco: Never   Vaping Use   ??? Vaping Use: Never used   Substance Use Topics   ??? Alcohol use: Not Currently   ??? Drug use: Not Currently       OBJECTIVE:  BP 115/64  - Pulse 72  - Temp 36.8 ??C (98.2 ??F) (Oral)  - Resp 18  - Ht 172.7 cm (5' 7.99)  - Wt 94.8 kg (209 lb)  - SpO2 98%  - BMI 31.79 kg/m??  Wt Readings from Last 12 Encounters:   02/11/22 94.8 kg (209 lb)   01/29/22 96.9 kg (213 lb 11.2 oz)   01/14/22 89.8 kg (198 lb)   01/03/22 88.9 kg (196 lb)   12/31/21 90.4 kg (199 lb 6.4 oz)   12/11/21 90 kg (198 lb 7.1 oz)   12/07/21 89.8 kg (198 lb)   11/26/21 92.1 kg (203 lb)   10/23/21 100.7 kg (222 lb 0.1 oz)   10/08/21 90 kg (198 lb 8 oz)   10/01/21 92.4 kg (203 lb 12.8 oz)   09/10/21 94.3 kg (208 lb)     Physical Exam  Constitutional:       Appearance: Normal appearance.   Pulmonary:      Effort: Pulmonary effort is normal. No respiratory distress.   Neurological:      Mental Status: He is alert.   Psychiatric:         Mood and Affect: Mood normal.         Behavior: Behavior normal.             BG/insulin reviewed per EMR.   Glucose, POC (mg/dL)   Date Value   04/54/0981 247 (H)   02/11/2022 310 (H)   02/11/2022 297 (H)   02/11/2022 147   02/11/2022 186 (H)   02/10/2022 310 (H)   02/10/2022 282 (H)   02/10/2022 268 (H)        Summary of labs:  Lab Results   Component Value Date    A1C 5.2 02/05/2022    A1C 4.7 (L) 01/14/2022    A1C 4.6 (L) 12/11/2021     Lab Results   Component Value Date    CREATININE 1.83 (H) 02/12/2022     Lab Results   Component Value Date    WBC 9.6 02/12/2022    HGB 9.0 (L) 02/12/2022    HCT 25.4 (L) 02/12/2022    PLT 83 (L) 02/12/2022       Lab Results   Component Value Date    NA 137 02/12/2022    K 4.1 02/12/2022    CL 100 02/12/2022    CO2 31.0 02/12/2022    BUN 42 (H) 02/12/2022    CREATININE 1.83 (H) 02/12/2022    GLU 251 (H) 02/12/2022    CALCIUM 9.0 02/12/2022    MG 1.4 (L) 02/12/2022    PHOS 4.7 02/12/2022       Lab Results   Component Value Date    BILITOT 0.7 02/12/2022    BILIDIR 0.40 (H) 02/12/2022    PROT 5.3 (L) 02/12/2022    ALBUMIN 3.4 02/12/2022    ALT 119 (H) 02/12/2022    AST 23 02/12/2022    ALKPHOS 90 02/12/2022    GGT 216 (H) 02/12/2022

## 2022-02-13 LAB — BASIC METABOLIC PANEL
ANION GAP: 8 mmol/L (ref 5–14)
ANION GAP: 7 mmol/L (ref 5–14)
BLOOD UREA NITROGEN: 38 mg/dL — ABNORMAL HIGH (ref 9–23)
BLOOD UREA NITROGEN: 38 mg/dL — ABNORMAL HIGH (ref 9–23)
BUN / CREAT RATIO: 24
BUN / CREAT RATIO: 23
CALCIUM: 8.9 mg/dL (ref 8.7–10.4)
CALCIUM: 8.4 mg/dL — ABNORMAL LOW (ref 8.7–10.4)
CHLORIDE: 101 mmol/L (ref 98–107)
CHLORIDE: 98 mmol/L (ref 98–107)
CO2: 30 mmol/L (ref 20.0–31.0)
CO2: 31 mmol/L (ref 20.0–31.0)
CREATININE: 1.57 mg/dL — ABNORMAL HIGH
CREATININE: 1.62 mg/dL — ABNORMAL HIGH
EGFR CKD-EPI (2021) MALE: 50 mL/min/{1.73_m2} — ABNORMAL LOW (ref >=60)
EGFR CKD-EPI (2021) MALE: 48 mL/min/{1.73_m2} — ABNORMAL LOW (ref >=60)
GLUCOSE RANDOM: 151 mg/dL (ref 70–179)
GLUCOSE RANDOM: 197 mg/dL — ABNORMAL HIGH (ref 70–179)
POTASSIUM: 4.5 mmol/L (ref 3.4–4.8)
POTASSIUM: 4.8 mmol/L (ref 3.4–4.8)
SODIUM: 139 mmol/L (ref 135–145)
SODIUM: 136 mmol/L (ref 135–145)

## 2022-02-13 LAB — HEPATIC FUNCTION PANEL
ALBUMIN: 3.4 g/dL (ref 3.4–5.0)
ALBUMIN: 3.2 g/dL — ABNORMAL LOW (ref 3.4–5.0)
ALKALINE PHOSPHATASE: 121 U/L — ABNORMAL HIGH (ref 46–116)
ALKALINE PHOSPHATASE: 126 U/L — ABNORMAL HIGH (ref 46–116)
ALT (SGPT): 151 U/L — ABNORMAL HIGH (ref 10–49)
ALT (SGPT): 131 U/L — ABNORMAL HIGH (ref 10–49)
AST (SGOT): 65 U/L — ABNORMAL HIGH (ref <=34)
AST (SGOT): 40 U/L — ABNORMAL HIGH (ref <=34)
BILIRUBIN DIRECT: 0.5 mg/dL — ABNORMAL HIGH (ref 0.00–0.30)
BILIRUBIN DIRECT: 0.4 mg/dL — ABNORMAL HIGH (ref 0.00–0.30)
BILIRUBIN TOTAL: 0.8 mg/dL (ref 0.3–1.2)
BILIRUBIN TOTAL: 0.7 mg/dL (ref 0.3–1.2)
PROTEIN TOTAL: 5.3 g/dL — ABNORMAL LOW (ref 5.7–8.2)
PROTEIN TOTAL: 5.1 g/dL — ABNORMAL LOW (ref 5.7–8.2)

## 2022-02-13 LAB — GAMMA GT
GAMMA GLUTAMYL TRANSFERASE: 274 U/L — ABNORMAL HIGH
GAMMA GLUTAMYL TRANSFERASE: 234 U/L — ABNORMAL HIGH

## 2022-02-13 LAB — CBC
HEMATOCRIT: 21.8 % — ABNORMAL LOW (ref 39.0–48.0)
HEMATOCRIT: 21.1 % — ABNORMAL LOW (ref 39.0–48.0)
HEMOGLOBIN: 7.6 g/dL — ABNORMAL LOW (ref 12.9–16.5)
HEMOGLOBIN: 7.4 g/dL — ABNORMAL LOW (ref 12.9–16.5)
MEAN CORPUSCULAR HEMOGLOBIN CONC: 34.7 g/dL (ref 32.0–36.0)
MEAN CORPUSCULAR HEMOGLOBIN CONC: 35 g/dL (ref 32.0–36.0)
MEAN CORPUSCULAR HEMOGLOBIN: 33 pg — ABNORMAL HIGH (ref 25.9–32.4)
MEAN CORPUSCULAR HEMOGLOBIN: 33.3 pg — ABNORMAL HIGH (ref 25.9–32.4)
MEAN CORPUSCULAR VOLUME: 94.9 fL (ref 77.6–95.7)
MEAN CORPUSCULAR VOLUME: 95.1 fL (ref 77.6–95.7)
MEAN PLATELET VOLUME: 10.9 fL — ABNORMAL HIGH (ref 6.8–10.7)
MEAN PLATELET VOLUME: 10.5 fL (ref 6.8–10.7)
PLATELET COUNT: 98 10*9/L — ABNORMAL LOW (ref 150–450)
PLATELET COUNT: 110 10*9/L — ABNORMAL LOW (ref 150–450)
RED BLOOD CELL COUNT: 2.29 10*12/L — ABNORMAL LOW (ref 4.26–5.60)
RED BLOOD CELL COUNT: 2.22 10*12/L — ABNORMAL LOW (ref 4.26–5.60)
RED CELL DISTRIBUTION WIDTH: 17.3 % — ABNORMAL HIGH (ref 12.2–15.2)
RED CELL DISTRIBUTION WIDTH: 17.9 % — ABNORMAL HIGH (ref 12.2–15.2)
WBC ADJUSTED: 7 10*9/L (ref 3.6–11.2)
WBC ADJUSTED: 7.1 10*9/L (ref 3.6–11.2)

## 2022-02-13 LAB — PROTIME-INR
INR: 1.08
INR: 0.92
PROTIME: 12.2 s (ref 9.8–12.8)
PROTIME: 10.4 s (ref 9.8–12.8)

## 2022-02-13 LAB — PHOSPHORUS
PHOSPHORUS: 4.3 mg/dL (ref 2.4–5.1)
PHOSPHORUS: 4.2 mg/dL (ref 2.4–5.1)

## 2022-02-13 LAB — TACROLIMUS LEVEL, TROUGH: TACROLIMUS, TROUGH: 4.4 ng/mL — ABNORMAL LOW (ref 5.0–15.0)

## 2022-02-13 LAB — MAGNESIUM
MAGNESIUM: 1.4 mg/dL — ABNORMAL LOW (ref 1.6–2.6)
MAGNESIUM: 2.4 mg/dL (ref 1.6–2.6)

## 2022-02-13 MED ORDER — TACROLIMUS 1 MG CAPSULE, IMMEDIATE-RELEASE
ORAL_CAPSULE | Freq: Two times a day (BID) | ORAL | 11 refills | 30 days
Start: 2022-02-13 — End: 2023-02-13

## 2022-02-13 MED ORDER — PANTOPRAZOLE 40 MG TABLET,DELAYED RELEASE
ORAL_TABLET | Freq: Every day | ORAL | 11 refills | 30 days
Start: 2022-02-13 — End: ?

## 2022-02-13 MED ORDER — HYDROXYZINE PAMOATE 50 MG CAPSULE
ORAL_CAPSULE | Freq: Every evening | ORAL | 3 refills | 30 days | PRN
Start: 2022-02-13 — End: ?

## 2022-02-13 MED ORDER — ASPIRIN 81 MG TABLET,DELAYED RELEASE
ORAL_TABLET | Freq: Every day | ORAL | 11 refills | 30 days
Start: 2022-02-13 — End: ?

## 2022-02-13 MED ORDER — TAMSULOSIN 0.4 MG CAPSULE
ORAL_CAPSULE | Freq: Every evening | ORAL | 11 refills | 30 days
Start: 2022-02-13 — End: 2023-02-13

## 2022-02-13 MED ORDER — METHOCARBAMOL 500 MG TABLET
ORAL_TABLET | Freq: Three times a day (TID) | ORAL | 0 refills | 30 days | PRN
Start: 2022-02-13 — End: ?

## 2022-02-13 MED ORDER — MELATONIN 3 MG TABLET
ORAL_TABLET | Freq: Every evening | ORAL | 11 refills | 30 days
Start: 2022-02-13 — End: ?

## 2022-02-13 MED ORDER — GABAPENTIN 300 MG CAPSULE
ORAL_CAPSULE | Freq: Every evening | ORAL | 11 refills | 30 days
Start: 2022-02-13 — End: ?

## 2022-02-13 MED ORDER — MG-PLUS-PROTEIN 133 MG TABLET
ORAL_TABLET | Freq: Two times a day (BID) | ORAL | 11 refills | 30 days
Start: 2022-02-13 — End: ?

## 2022-02-13 MED ADMIN — insulin lispro (HumaLOG) injection 0-20 Units: 0-20 [IU] | SUBCUTANEOUS | @ 16:00:00

## 2022-02-13 MED ADMIN — magnesium sulfate 2gm/50mL IVPB: 2 g | INTRAVENOUS | @ 15:00:00 | Stop: 2022-02-13

## 2022-02-13 MED ADMIN — nystatin (MYCOSTATIN) oral suspension: 10 mL | ORAL | @ 21:00:00

## 2022-02-13 MED ADMIN — mycophenolate (CELLCEPT) tablet 1,000 mg: 1000 mg | ORAL | @ 13:00:00

## 2022-02-13 MED ADMIN — methocarbamoL (ROBAXIN) tablet 500 mg: 500 mg | ORAL

## 2022-02-13 MED ADMIN — tamsulosin (FLOMAX) 24 hr capsule 0.4 mg: .4 mg | ORAL

## 2022-02-13 MED ADMIN — tacrolimus (PROGRAF) 9 mg combo product: 9 mg | ORAL | @ 21:00:00

## 2022-02-13 MED ADMIN — valGANciclovir (VALCYTE) tablet 450 mg: 450 mg | ORAL | @ 13:00:00

## 2022-02-13 MED ADMIN — oxyCODONE (ROXICODONE) immediate release tablet 10 mg: 10 mg | ORAL | @ 03:00:00 | Stop: 2022-02-25

## 2022-02-13 MED ADMIN — tacrolimus (PROGRAF) 8 mg combo product: 8 mg | ORAL | @ 10:00:00 | Stop: 2022-02-13

## 2022-02-13 MED ADMIN — acetaminophen (TYLENOL) tablet 650 mg: 650 mg | ORAL | @ 13:00:00

## 2022-02-13 MED ADMIN — insulin NPH (HumuLIN,NovoLIN) injection 16 Units: 16 [IU] | SUBCUTANEOUS | @ 13:00:00

## 2022-02-13 MED ADMIN — nystatin (MYCOSTATIN) oral suspension: 10 mL | ORAL | @ 10:00:00

## 2022-02-13 MED ADMIN — insulin lispro (HumaLOG) injection 10 Units: 10 [IU] | SUBCUTANEOUS | @ 16:00:00

## 2022-02-13 MED ADMIN — methocarbamoL (ROBAXIN) tablet 500 mg: 500 mg | ORAL | @ 21:00:00

## 2022-02-13 MED ADMIN — insulin lispro (HumaLOG) injection 10 Units: 10 [IU] | SUBCUTANEOUS | @ 13:00:00

## 2022-02-13 MED ADMIN — melatonin tablet 3 mg: 3 mg | ORAL | @ 21:00:00

## 2022-02-13 MED ADMIN — insulin lispro (HumaLOG) injection 10 Units: 10 [IU] | SUBCUTANEOUS

## 2022-02-13 MED ADMIN — magnesium sulfate 2gm/50mL IVPB: 2 g | INTRAVENOUS | @ 13:00:00 | Stop: 2022-02-13

## 2022-02-13 MED ADMIN — insulin lispro (HumaLOG) injection 0-20 Units: 0-20 [IU] | SUBCUTANEOUS | @ 03:00:00

## 2022-02-13 MED ADMIN — predniSONE (DELTASONE) tablet 15 mg: 15 mg | ORAL | @ 13:00:00 | Stop: 2022-02-13

## 2022-02-13 MED ADMIN — methocarbamoL (ROBAXIN) tablet 500 mg: 500 mg | ORAL | @ 10:00:00

## 2022-02-13 MED ADMIN — sulfamethoxazole-trimethoprim (BACTRIM) 400-80 mg tablet 80 mg of trimethoprim: 1 | ORAL | @ 13:00:00 | Stop: 2022-05-13

## 2022-02-13 MED ADMIN — oxyCODONE (ROXICODONE) immediate release tablet 10 mg: 10 mg | ORAL | @ 13:00:00 | Stop: 2022-02-25

## 2022-02-13 MED ADMIN — mycophenolate (CELLCEPT) tablet 1,000 mg: 1000 mg | ORAL

## 2022-02-13 MED ADMIN — nystatin (MYCOSTATIN) oral suspension: 10 mL | ORAL

## 2022-02-13 MED ADMIN — gabapentin (NEURONTIN) capsule 300 mg: 300 mg | ORAL

## 2022-02-13 MED ADMIN — pantoprazole (PROTONIX) EC tablet 40 mg: 40 mg | ORAL | @ 13:00:00

## 2022-02-13 MED ADMIN — insulin NPH (HumuLIN,NovoLIN) injection 16 Units: 16 [IU] | SUBCUTANEOUS

## 2022-02-13 MED ADMIN — methocarbamoL (ROBAXIN) tablet 500 mg: 500 mg | ORAL | @ 16:00:00

## 2022-02-13 NOTE — Unmapped (Signed)
POC reviewed with the patient. PT S/P liver transplant. Patient is A/Ox4. NAD. Patient complaints of pain are well controlled by pain medication.  Patient remains afebrile and exhibits no signs and symptoms of infection. Patient voiding and output has been good. Patient is tolerating diet. Patient is standby assist with a walker and has remained free from injury. Bed in low/locked position. Call bell in reach. Will Continue to monitor     Problem: Adult Inpatient Plan of Care  Goal: Plan of Care Review  Outcome: Progressing  Goal: Patient-Specific Goal (Individualized)  Description: Patient will be able to have bowel movement by day shift on 02/11/2022.   Outcome: Progressing  Goal: Absence of Hospital-Acquired Illness or Injury  Outcome: Progressing  Goal: Optimal Comfort and Wellbeing  Outcome: Progressing  Goal: Readiness for Transition of Care  Outcome: Progressing  Goal: Rounds/Family Conference  Outcome: Progressing     Problem: Infection  Goal: Absence of Infection Signs and Symptoms  Outcome: Progressing     Problem: Impaired Wound Healing  Goal: Optimal Wound Healing  Outcome: Progressing     Problem: Skin Injury Risk Increased  Goal: Skin Health and Integrity  Outcome: Progressing     Problem: Self-Care Deficit  Goal: Improved Ability to Complete Activities of Daily Living  Outcome: Progressing     Problem: Fall Injury Risk  Goal: Absence of Fall and Fall-Related Injury  Outcome: Progressing

## 2022-02-13 NOTE — Unmapped (Signed)
Tacrolimus Therapeutic Monitoring Pharmacy Note    Matthew Key is a 61 y.o. male starting tacrolimus.     Indication: Liver transplant     Date of Transplant:  02/06/22       Prior Dosing Information: Current regimen 8 mg BID      Goals:  Therapeutic Drug Levels  Tacrolimus trough goal: 8-10 ng/mL    Additional Clinical Monitoring/Outcomes  Monitor renal function (SCr and urine output) and liver function (LFTs)  Monitor for signs/symptoms of adverse events (e.g., hyperglycemia, hyperkalemia, hypomagnesemia, hypertension, headache, tremor)    Results:   Tacrolimus level:  4.4 ng/mL, drawn appropriately    Pharmacokinetic Considerations and Significant Drug Interactions:   Concurrent hepatotoxic medications: None identified  Concurrent CYP3A4 substrates/inhibitors: None identified  Concurrent nephrotoxic medications:  Valcyte, Bactrim    Assessment/Plan:  Recommendedation(s)  Increase to tac 9 mg BID.    Follow-up  Daily tac levels  .   A pharmacist will continue to monitor and recommend levels as appropriate    Please page service pharmacist with questions/clarifications.    [name], PharmD

## 2022-02-13 NOTE — Unmapped (Signed)
Transplant Surgery Progress Note    Date of service: 02/13/2022    Hospital Day:  LOS: 7 days   Surgery Date(s): 02/06/22    Interval History: NAEON. Afebrile and stable on RA. BP soft (106/67 on 7/4 PM) relative to baseline but stable with most recent 124/63. Alert and oriented. Pain controlled, making adequate urine. 3x weekly with OT per 7/3 note. Sanguinous drainage from superior part of mercedes incision likely due to ascites drainage, incision looks clean. Intact, and healing well. Drains continue to have large output.      Assessment/Plan:  Matthew Key is a 60 y.o. 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     Neurological:    - MMPC    Cardiovascular:   - CTM; floor with tele    Pulmonary:   *Extubated on 02/07/22, now on room air  - Pulm toilet/OOB    Renal/Genitourinary:  *Strict I&Os  - Trend BUN/Cr (Cr improved 1.57 from 1.83)  - passed TOV  - Flomax    GI/Nutrition:  F: ML  E: Replete PRN - replenished Mg  N: regular diet    *S/p OLT on 02/06/22  - Daily labs - AST/ALT/Alk Phos/GGT all increased overnight - continue to trend  - Liver transplant doppler: WNL x2 (most recent 02/11/22)  - Surgical drains to bulb suction x2 - dark-serosanguinous drainage with no clots output with down trending Hgb. Will check PM labs  - 25% albumin BID    Heme:   DVT ppx: SQH BID held   - Trend PLT (improving)    Induction immunosuppression: methylpred and basiliximab  Maintenance immunosuppression: MMF and tac    ID:  - No abx indicated    PT consults today    Dispo: Floor    Objective:    Physical Exam:    General: NAD  CV: Regular rate per telemetry  Resp: NWOB on room air  Abdomen: Soft, mildly distended, appropriately tender, surgical drains w/ dark-serosanguinous drainage with no clots output x2. Mercedes incision clean and intact, dark-serosanguinous drainage with no clots on dressings  Extremities: WWP    Data Review:   I have reviewed the labs and studies from the last 24 hours.    Vitals Reviewed:    Temp:  [36.7 ??C (98.1 ??F)-37 ??C (98.6 ??F)] 37 ??C (98.6 ??F)  Heart Rate:  [78-101] 82  Resp:  [16-18] 17  BP: (103-124)/(55-71) 115/55  MAP (mmHg):  [72-87] 72  SpO2:  [97 %-100 %] 97 %   Temp (24hrs), Avg:36.8 ??C (98.3 ??F), Min:36.7 ??C (98.1 ??F), Max:37 ??C (98.6 ??F)     SpO2: 97 %   Height: 172.7 cm (5' 7.99)    Weight: 94.8 kg (209 lb)    Body mass index is 31.79 kg/m??.    Body surface area is 2.13 meters squared.       Intake/Output Summary (Last 24 hours) at 02/13/2022 0759  Last data filed at 02/13/2022 0308  Gross per 24 hour   Intake 810 ml   Output 1860 ml   Net -1050 ml        I/O last 3 completed shifts:  In: 1250 [P.O.:1250]  Out: 5450 [Urine:3850; Drains:1600]   No intake/output data recorded.      Tubes and Drains:  Patient Lines/Drains/Airways Status       Active Active Lines, Drains, &  Airways       Name Placement date Placement time Site Days    Closed/Suction Drain 1 Anterior RLQ Bulb 10 Fr. 02/06/22  1052  RLQ  6    Closed/Suction Drain 2 Anterior;Lateral RLQ Bulb 10 Fr. 02/06/22  1150  RLQ  6    Peripheral IV 02/10/22 Anterior;Right Forearm 02/10/22  1401  Forearm  2                    Warrick Parisian, MD  PGY-1 General Surgery

## 2022-02-13 NOTE — Unmapped (Signed)
Endocrine Team Diabetes Follow Up Consult Note     Consult information:  Requesting Attending Physician : Matthew Nearing, MD  Service Requesting Consult : Surg Transplant [facility])  Primary Care Provider: Loman Brooklyn, MD  Impression:  Matthew Key is a 61 y.o. male admitted for OLT. We have been consulted at the request of Matthew Nearing, MD to evaluate Matthew Key for hyperglycemia.     Medical Decision Making:  Diagnoses:  1.Type 2 Diabetes. Uncontrolled With severe hyperglycemia last 24 hours.  2. Nutrition: Complicating glycemic control. Increasing risk for both hypoglycemia and hyperglycemia.  3. Obesity. Complicating glycemic control and increasing risk for hyperglycemia.  4. Transplant. Complicating glycemic control and increasing risk for hyperglycemia.  5. Steroids. Complicating glycemic control and increasing risk for hyperglycemia.  6. History of secondary adrenal insufficiency.  7. Kidney disease. Complicating glycemic control and increasing risk for hypoglycemia.      Studies reviewed 02/13/22:  Labs: CBC, BMP, POCT-BG  Interpretation: Anemia noted. Elevated creatinine with decreased GFR consistent with kidney disease. Glucose trend revealed hyperglycemia, some severe.    Notes reviewed: Primary team and nursing notes      Overall impression based on above reviews and history:  Type 2 diabetes with severe hyperglycemia. Insulin doses recently increased. Given BG trending to goal and steroids continuing to taper will monitor todays trends and adjust later if warranted.     Recommendations:  Type 2 Diabetes:  - NPH 16 units q12h  - lispro 10 AC  - lispro target 140, ISF 30 achs  - Hypoglycemia protocol.  - POCT-BG achs.  - Ensure patient is on glucose precautions if patient taking nutrition by mouth.     Discharge planning:  In process. Will complete closer to discharge.      2. History of Secondary Adrenal Insufficiency:  -Normal HPA axis noted in January 2023. Off steroids since. Continue taper as scheduled.    Thank you for this consult. Discussed plan with primary team. We will continue to follow and make recommendations and place orders as appropriate.    Please page with questions or concerns: Matthew Narine, NP: [phone]  DCT on call from 6AM - 3PM on weekdays then endocrine fellow on call: [phone] from 3PM - 6AM on weekdays and on weekends and holidays.   If APP cannot be reached, please page the endocrine fellow on call.      Subjective:  Interval history:  The patient reports no acute complaints. PO intake decent.     Initial HPI:  Matthew Key is a 61 y.o. 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     Endocrine History: History of secondary adrenal insufficiency. Had been receiving steroids steroids for spinal injections. We saw back in January of this year and noted HPA axis recovery based on ACTH stim test (cortisol went from 8.4-> 22.9). Since then he was prescribed hydrocortisone 10mg  every day prn illness, but states he hasn't taken any.    Diabetes History:  Patient has a history of Type 2 diabetes diagnosed around 2017.  Diabetes is managed by: PCP  Current home diabetes regimen: tresiba 30 units at bedtime. He was on ozempic 1mg  qweek, but was stopped on 6/19 due significant hypotension (DBP in the 30's per pt). He was admitted and it was noted ozempic was held for AKI.   Current home  blood glucose monitoring:   1-2 times a day.  Hypoglycemia awareness: yes.  Complications related to diabetes: unknown. Hx of AKI earlier this month. Initially thought to be due to HRS or ATN, but later thought to be pre-renal.       Current Nutrition:  Active Orders   Diet    Nutrition Therapy Regular/House; No Carbonation       ROS: As per HPI.     gabapentin  300 mg Oral Nightly    insulin lispro  0-20 Units Subcutaneous ACHS    insulin lispro  10 Units Subcutaneous 3xd Meals    insulin NPH  16 Units Subcutaneous Q12H    melatonin  3 mg Oral QPM    methocarbamoL  500 mg Oral QID    mycophenolate  1,000 mg Oral BID    nystatin  10 mL Oral Q8H SCH    pantoprazole  40 mg Oral Daily    polyethylene glycol  17 g Oral BID    predniSONE  15 mg Oral Daily    Followed by    Matthew Key ON 02/14/2022] predniSONE  10 mg Oral Daily    Followed by    Matthew Key ON 02/15/2022] predniSONE  5 mg Oral Daily    Followed by    Matthew Key ON 02/21/2022] predniSONE  2.5 mg Oral Daily    sulfamethoxazole-trimethoprim  1 tablet Oral Once per day on Mon Wed Fri    tacrolimus  8 mg Oral BID    tamsulosin  0.4 mg Oral Nightly    valGANciclovir  450 mg Oral Every other day       Current Outpatient Medications   Medication Instructions    acetaminophen (TYLENOL) 650 mg, Oral, Every 6 hours PRN    atorvastatin (LIPITOR) 10 mg, Oral, Nightly    ciprofloxacin HCl (CIPRO) 500 MG tablet TAKE 1 TABLET BY MOUTH IN THE MORNING    furosemide (LASIX) 40 mg, Oral, Daily (standard), Restart on 6/23    gabapentin (NEURONTIN) 300 mg, Oral, At bedtime    hydrOXYzine (VISTARIL) 50 mg, Oral, Nightly PRN    insulin syringe-needle U-100 1 mL 31 gauge x 5/16 (8 mm) Syrg Use once as needed for Hydrocortisone injection.Marland Kitchen    lactulose 20 g, Oral, 3 times a day (standard)    mycophenolate (CELLCEPT) 1,000 mg, Oral, 2 times a day (standard)    ondansetron (ZOFRAN-ODT) 4 mg, Oral, Every 8 hours PRN    pantoprazole (PROTONIX) 40 mg, Oral, Daily (standard)    pen needle, diabetic (BD ULTRA-FINE NANO PEN NEEDLE) 32 gauge x 5/32 (4 mm) Ndle ok to sub any brand or size needle preferred by insurance/patient, use 1-2x/day, dx E11.65    sodium chloride (AYR) 0.65 % Drop 1 spray, Every 4 hours PRN    spironolactone (ALDACTONE) 50 mg, Oral, Daily (standard), Restart 6/23    syringe with needle 3 mL 23 x 1 Syrg Use as needed for Hydrocortisone injection    tacrolimus (PROGRAF) 5 mg, Oral, 2 times a day    TRESIBA FLEXTOUCH U-100 30 Units, Subcutaneous, At bedtime, Adjust as instructed.    valGANciclovir (VALCYTE) 450 mg, Oral, Daily (standard)    zinc sulfate 110 mg, Oral, Daily           Past Medical History:   Diagnosis Date    AKI (acute kidney injury) (CMS-HCC) 12/14/2020    Anxiety 10/22/2013    Arthritis     Cervical radiculopathy 12/03/2016    Chronic pain disorder  Lower back    Cirrhosis (CMS-HCC)     Dental abscess 10/2020    Duodenal ulcer 12/01/2017    GERD (gastroesophageal reflux disease)     History of transfusion     Hyperlipidemia 10/22/2013    Hypertension     under control with meds and weight loss    Hypotension 01/29/2022    Liver disease     Sleep apnea, obstructive     Have machine    Stroke (CMS-HCC)     mild stroke    Type 2 diabetes mellitus with diabetic neuropathy, with long-term current use of insulin (CMS-HCC) 06/09/2014       Past Surgical History:   Procedure Laterality Date    KNEE SURGERY      PR CATH PLACE/CORON ANGIO, IMG SUPER/INTERP,R&L HRT CATH, L HRT VENTRIC N/A 12/19/2020    Procedure: CATH LEFT/RIGHT HEART CATHETERIZATION;  Surgeon: Rosana Hoes, MD;  Location[facility] CATH;  Service: Cardiology    PR COLONOSCOPY FLX DX W/COLLJ SPEC WHEN PFRMD N/A 12/07/2021    Procedure: COLONOSCOPY, FLEXIBLE, PROXIMAL TO SPLENIC FLEXURE; DIAGNOSTIC, W/WO COLLECTION SPECIMEN BY BRUSH OR WASH;  Surgeon: Annie Paras, MD;  [facility]: [facility];  Service: Gastroenterology    PR TRANSPLANT LIVER,ALLOTRANSPLANT N/A 02/06/2022    Procedure: LIVER ALLOTRANSPLANTATION; ORTHOTOPIC, PARTIAL OR WHOLE, FROM CADAVER OR LIVING DONOR, ANY AGE;  Surgeon: Florene Glen, MD;  Location: MAIN OR [place];  Service: Transplant    PR TRANSPLANT,PREP DONOR LIVER/ARTERIAL N/A 02/06/2022    Procedure: BACKBNCH RECONSTRUCT OF CAD/LIVE DONOR LIVER GFT PRIOR TRANSPLANT; ARTERIAL ANASTAMOSIS, EA;  Surgeon: Florene Glen, MD;  Location: MAIN OR [place];  Service: Transplant    PR UPPER GI ENDOSCOPY,BIOPSY N/A 10/12/2020 Procedure: UGI ENDOSCOPY; WITH BIOPSY, SINGLE OR MULTIPLE;  Surgeon: Marene Lenz, MD;  [facility]: [facility];  Service: Gastroenterology    PR UPPER GI ENDOSCOPY,DIAGNOSIS N/A 01/03/2022    Procedure: UGI ENDO, INCLUDE ESOPHAGUS, STOMACH, & DUODENUM &/OR JEJUNUM; DX W/WO COLLECTION SPECIMN, BY BRUSH OR WASH;  Surgeon: Marene Lenz, MD;  Location: [facility];  Service: Gastroenterology    PR UPPER GI ENDOSCOPY,LIGAT VARIX N/A 12/07/2021    Procedure: UGI ENDO; Everlene Balls LIG ESOPH &/OR GASTRIC VARICES;  Surgeon: Annie Paras, MD;  Location: [facility];  Service: Gastroenterology    ROOT CANAL      Front teeth       Family History   Problem Relation Age of Onset    Edema Mother     Alzheimer's disease Father     Aortic dissection Brother     Early death Brother     Aneurysm Brother        Social History     Tobacco Use    Smoking status: Never    Smokeless tobacco: Never   Vaping Use    Vaping Use: Never used   Substance Use Topics    Alcohol use: Not Currently    Drug use: Not Currently       OBJECTIVE:  BP 124/63  - Pulse 79  - Temp 36.7 ??C (98.1 ??F) (Oral)  - Resp 16  - Ht 172.7 cm (5' 7.99)  - Wt 94.8 kg (209 lb)  - SpO2 100%  - BMI 31.79 kg/m??   Wt Readings from Last 12 Encounters:   02/11/22 94.8 kg (209 lb)   01/29/22 96.9 kg (213 lb 11.2 oz)   01/14/22 89.8 kg (198 lb)  01/03/22 88.9 kg (196 lb)   12/31/21 90.4 kg (199 lb 6.4 oz)   12/11/21 90 kg (198 lb 7.1 oz)   12/07/21 89.8 kg (198 lb)   11/26/21 92.1 kg (203 lb)   10/23/21 100.7 kg (222 lb 0.1 oz)   10/08/21 90 kg (198 lb 8 oz)   10/01/21 92.4 kg (203 lb 12.8 oz)   09/10/21 94.3 kg (208 lb)     Physical Exam  Constitutional:       Appearance: Normal appearance.   Pulmonary:      Effort: Pulmonary effort is normal. No respiratory distress.   Neurological:      Mental Status: He is alert.   Psychiatric:         Mood and Affect: Mood normal.         Behavior: Behavior normal.           BG/insulin reviewed per EMR.   Glucose, POC (mg/dL)   Date Value   16/05/9603 136   02/12/2022 202 (H)   02/12/2022 276 (H)   02/12/2022 198 (H)   02/12/2022 247 (H)   02/11/2022 310 (H)   02/11/2022 297 (H)   02/11/2022 147        Summary of labs:  Lab Results   Component Value Date    A1C 5.2 02/05/2022    A1C 4.7 (L) 01/14/2022    A1C 4.6 (L) 12/11/2021     Lab Results   Component Value Date    CREATININE 1.57 (H) 02/13/2022     Lab Results   Component Value Date    WBC 7.0 02/13/2022    HGB 7.6 (L) 02/13/2022    HCT 21.8 (L) 02/13/2022    PLT 98 (L) 02/13/2022       Lab Results   Component Value Date    NA 139 02/13/2022    K 4.5 02/13/2022    CL 101 02/13/2022    CO2 30.0 02/13/2022    BUN 38 (H) 02/13/2022    CREATININE 1.57 (H) 02/13/2022    GLU 151 02/13/2022    CALCIUM 8.9 02/13/2022    MG 1.4 (L) 02/13/2022    PHOS 4.3 02/13/2022       Lab Results   Component Value Date    BILITOT 0.8 02/13/2022    BILIDIR 0.50 (H) 02/13/2022    PROT 5.3 (L) 02/13/2022    ALBUMIN 3.4 02/13/2022    ALT 151 (H) 02/13/2022    AST 65 (H) 02/13/2022    ALKPHOS 121 (H) 02/13/2022    GGT 274 (H) 02/13/2022

## 2022-02-13 NOTE — Unmapped (Signed)
Abdominal Transplant Inpatient Pharmacy Education Note    Date: 02/13/2022    Patient: Matthew Key    s/p liver transplant    Discharge Education Provided: ?    Provided patient a medication card with list of all discharge medications. Discussed anti-rejection and anti-infective medication regimens in depth. The patient's full medication schedule was discussed in detail with the patient and included drug name, indication, dose, appropriate timing of self-administration, drug monitoring, drug/drug interaction drug/food interaction, herbal/over-the-counter medication use, side effect profile, and generic versus brand formulation.    Adverse effects of tacrolimus discussed: Nephrotoxicity, neurotoxicity including headache, tremor and possible seizures and metabolic disturbances including hypertension, hyperglycemia and hyperlipidemia. The patient verbalized understanding regarding not to take their calcineurin inhibitor prior to lab draws to ensure accurate trough levels.    Adverse effects of mycophenolic acid: Decreased blood counts, infection and most commonly gastrointestinal distress consisting of diarrhea, nausea and abdominal pain.     Adverse effects of prednisone discussed: Hypertension, hyperlipidemia, hyperglycemia, mood swings, insomnia, gastric ulcers.      Stressed the importance of medication adherence to transplant outcomes.    [employer] Medication Guide will be given at time of discharge.     Educated on avoidance of live vaccines  Educated on contacting outpatient nurse coordinator before starting any herbal supplements and for any medication changes made by providers outside of the transplant team      The patient demonstrated a good understanding of the discharge medications. Patient and family members, sister - Matthew Key and spouse, were on the phone during education and engaged. The patient was instructed to bring the card and pill bottles to their first follow up visit. All questions and concerns were answered.    25-60 minutes of discharge education was completed with this patient.  To promote optimum therapeutic outcomes and prevent medication errors, the patient???s medication profile was reviewed daily by pharmacy and discussed with the multidisciplinary transplant team.     [name],  PharmD

## 2022-02-14 DIAGNOSIS — Z944 Liver transplant status: Principal | ICD-10-CM

## 2022-02-14 DIAGNOSIS — Z7952 Long term (current) use of systemic steroids: Principal | ICD-10-CM

## 2022-02-14 DIAGNOSIS — Z796 Long-term use of immunosuppressant medication: Principal | ICD-10-CM

## 2022-02-14 LAB — BASIC METABOLIC PANEL
ANION GAP: 5 mmol/L (ref 5–14)
BLOOD UREA NITROGEN: 37 mg/dL — ABNORMAL HIGH (ref 9–23)
BUN / CREAT RATIO: 21
CALCIUM: 8.7 mg/dL (ref 8.7–10.4)
CHLORIDE: 100 mmol/L (ref 98–107)
CO2: 30 mmol/L (ref 20.0–31.0)
CREATININE: 1.77 mg/dL — ABNORMAL HIGH
EGFR CKD-EPI (2021) MALE: 43 mL/min/{1.73_m2} — ABNORMAL LOW (ref >=60)
GLUCOSE RANDOM: 177 mg/dL (ref 70–179)
POTASSIUM: 4.3 mmol/L (ref 3.4–4.8)
SODIUM: 135 mmol/L (ref 135–145)

## 2022-02-14 LAB — PHOSPHORUS: PHOSPHORUS: 4.1 mg/dL (ref 2.4–5.1)

## 2022-02-14 LAB — CBC
HEMATOCRIT: 26.9 % — ABNORMAL LOW (ref 39.0–48.0)
HEMOGLOBIN: 9.5 g/dL — ABNORMAL LOW (ref 12.9–16.5)
MEAN CORPUSCULAR HEMOGLOBIN CONC: 35.2 g/dL (ref 32.0–36.0)
MEAN CORPUSCULAR HEMOGLOBIN: 32.5 pg — ABNORMAL HIGH (ref 25.9–32.4)
MEAN CORPUSCULAR VOLUME: 92.3 fL (ref 77.6–95.7)
MEAN PLATELET VOLUME: 10.4 fL (ref 6.8–10.7)
PLATELET COUNT: 138 10*9/L — ABNORMAL LOW (ref 150–450)
RED BLOOD CELL COUNT: 2.91 10*12/L — ABNORMAL LOW (ref 4.26–5.60)
RED CELL DISTRIBUTION WIDTH: 18.6 % — ABNORMAL HIGH (ref 12.2–15.2)
WBC ADJUSTED: 9.1 10*9/L (ref 3.6–11.2)

## 2022-02-14 LAB — PROTIME-INR
INR: 0.97
PROTIME: 10.9 s (ref 9.8–12.8)

## 2022-02-14 LAB — HEPATIC FUNCTION PANEL
ALBUMIN: 3.3 g/dL — ABNORMAL LOW (ref 3.4–5.0)
ALKALINE PHOSPHATASE: 112 U/L (ref 46–116)
ALT (SGPT): 117 U/L — ABNORMAL HIGH (ref 10–49)
AST (SGOT): 30 U/L (ref <=34)
BILIRUBIN DIRECT: 0.4 mg/dL — ABNORMAL HIGH (ref 0.00–0.30)
BILIRUBIN TOTAL: 0.8 mg/dL (ref 0.3–1.2)
PROTEIN TOTAL: 5.5 g/dL — ABNORMAL LOW (ref 5.7–8.2)

## 2022-02-14 LAB — TACROLIMUS LEVEL, TROUGH: TACROLIMUS, TROUGH: 5.7 ng/mL (ref 5.0–15.0)

## 2022-02-14 LAB — MAGNESIUM: MAGNESIUM: 2.2 mg/dL (ref 1.6–2.6)

## 2022-02-14 LAB — GAMMA GT: GAMMA GLUTAMYL TRANSFERASE: 217 U/L — ABNORMAL HIGH

## 2022-02-14 MED ORDER — MELATONIN 3 MG TABLET
ORAL_TABLET | Freq: Every evening | ORAL | 11 refills | 30 days | Status: CP
Start: 2022-02-14 — End: ?
  Filled 2022-02-15: qty 60, 60d supply, fill #0

## 2022-02-14 MED ORDER — HYDROXYZINE PAMOATE 50 MG CAPSULE
ORAL_CAPSULE | Freq: Every evening | ORAL | 3 refills | 30 days | Status: CP | PRN
Start: 2022-02-14 — End: ?
  Filled 2022-02-15: qty 30, 30d supply, fill #0

## 2022-02-14 MED ORDER — TACROLIMUS 1 MG CAPSULE, IMMEDIATE-RELEASE
ORAL_CAPSULE | Freq: Two times a day (BID) | ORAL | 11 refills | 30 days | Status: CP
Start: 2022-02-14 — End: 2023-02-14
  Filled 2022-02-15: qty 540, 30d supply, fill #0

## 2022-02-14 MED ORDER — GABAPENTIN 300 MG CAPSULE
ORAL_CAPSULE | Freq: Every evening | ORAL | 11 refills | 30 days | Status: CP
Start: 2022-02-14 — End: ?

## 2022-02-14 MED ORDER — ASPIRIN 81 MG TABLET,DELAYED RELEASE
ORAL_TABLET | Freq: Every day | ORAL | 11 refills | 30 days | Status: CP
Start: 2022-02-14 — End: ?

## 2022-02-14 MED ORDER — TRESIBA FLEXTOUCH U-100 INSULIN 100 UNIT/ML (3 ML) SUBCUTANEOUS PEN
Freq: Every day | SUBCUTANEOUS | 11 refills | 50.00000 days | Status: CP
Start: 2022-02-14 — End: 2023-02-09
  Filled 2022-02-15: qty 15, 41d supply, fill #0
  Filled 2022-02-15: qty 15, 50d supply, fill #0

## 2022-02-14 MED ORDER — BLOOD-GLUCOSE METER
0 refills | 0 days | Status: CP
Start: 2022-02-14 — End: ?
  Filled 2022-02-14: qty 1, 30d supply, fill #0

## 2022-02-14 MED ORDER — TAMSULOSIN 0.4 MG CAPSULE
ORAL_CAPSULE | Freq: Every evening | ORAL | 11 refills | 30 days | Status: CP
Start: 2022-02-14 — End: 2023-02-14
  Filled 2022-02-15: qty 30, 30d supply, fill #0

## 2022-02-14 MED ORDER — INSULIN ASPART (U-100) 100 UNIT/ML (3 ML) SUBCUTANEOUS PEN
6 refills | 0.00000 days | Status: CP
Start: 2022-02-14 — End: 2022-02-14

## 2022-02-14 MED ORDER — METHOCARBAMOL 500 MG TABLET
ORAL_TABLET | Freq: Three times a day (TID) | ORAL | 0 refills | 30 days | Status: CP | PRN
Start: 2022-02-14 — End: ?
  Filled 2022-02-15: qty 90, 30d supply, fill #0

## 2022-02-14 MED ORDER — DOCUSATE SODIUM 100 MG CAPSULE
ORAL_CAPSULE | Freq: Two times a day (BID) | ORAL | 11 refills | 30 days | Status: CP | PRN
Start: 2022-02-14 — End: ?
  Filled 2022-02-15: qty 60, 30d supply, fill #0

## 2022-02-14 MED ORDER — MG-PLUS-PROTEIN 133 MG TABLET
ORAL_TABLET | Freq: Two times a day (BID) | ORAL | 3 refills | 50 days | Status: CP
Start: 2022-02-14 — End: ?
  Filled 2022-02-15: qty 100, 50d supply, fill #0

## 2022-02-14 MED ORDER — ACCU-CHEK GUIDE TEST STRIPS
ORAL_STRIP | 11 refills | 0 days | Status: CP
Start: 2022-02-14 — End: ?
  Filled 2022-02-14: qty 100, 25d supply, fill #0

## 2022-02-14 MED ORDER — OXYCODONE 5 MG TABLET
ORAL_TABLET | Freq: Four times a day (QID) | ORAL | 0 refills | 3 days | Status: CP | PRN
Start: 2022-02-14 — End: ?
  Filled 2022-02-15: qty 25, 3d supply, fill #0

## 2022-02-14 MED ORDER — PEN NEEDLE, DIABETIC 32 GAUGE X 5/32" (4 MM)
12 refills | 0 days | Status: CP
Start: 2022-02-14 — End: ?

## 2022-02-14 MED ORDER — PANTOPRAZOLE 40 MG TABLET,DELAYED RELEASE
ORAL_TABLET | Freq: Every day | ORAL | 11 refills | 30 days | Status: CP
Start: 2022-02-14 — End: ?

## 2022-02-14 MED ORDER — LANCETS
0 refills | 0 days | Status: CP
Start: 2022-02-14 — End: ?
  Filled 2022-02-14: qty 100, 25d supply, fill #0

## 2022-02-14 MED ADMIN — valGANciclovir (VALCYTE) tablet 450 mg: 450 mg | ORAL | @ 12:00:00

## 2022-02-14 MED ADMIN — pantoprazole (PROTONIX) EC tablet 40 mg: 40 mg | ORAL | @ 12:00:00

## 2022-02-14 MED ADMIN — mycophenolate (CELLCEPT) tablet 1,000 mg: 1000 mg | ORAL | @ 12:00:00

## 2022-02-14 MED ADMIN — tacrolimus (PROGRAF) 9 mg combo product: 9 mg | ORAL | @ 10:00:00

## 2022-02-14 MED ADMIN — oxyCODONE (ROXICODONE) immediate release tablet 10 mg: 10 mg | ORAL | @ 12:00:00 | Stop: 2022-02-25

## 2022-02-14 MED ADMIN — insulin lispro (HumaLOG) injection 0-20 Units: 0-20 [IU] | SUBCUTANEOUS

## 2022-02-14 MED ADMIN — tacrolimus (PROGRAF) 9 mg combo product: 9 mg | ORAL | @ 22:00:00

## 2022-02-14 MED ADMIN — insulin lispro (HumaLOG) injection 0-20 Units: 0-20 [IU] | SUBCUTANEOUS | @ 18:00:00

## 2022-02-14 MED ADMIN — gabapentin (NEURONTIN) capsule 300 mg: 300 mg | ORAL

## 2022-02-14 MED ADMIN — nystatin (MYCOSTATIN) oral suspension: 10 mL | ORAL

## 2022-02-14 MED ADMIN — insulin lispro (HumaLOG) injection 10 Units: 10 [IU] | SUBCUTANEOUS | @ 12:00:00

## 2022-02-14 MED ADMIN — insulin NPH (HumuLIN,NovoLIN) injection 16 Units: 16 [IU] | SUBCUTANEOUS | @ 12:00:00

## 2022-02-14 MED ADMIN — insulin lispro (HumaLOG) injection 10 Units: 10 [IU] | SUBCUTANEOUS

## 2022-02-14 MED ADMIN — insulin lispro (HumaLOG) injection 0-20 Units: 0-20 [IU] | SUBCUTANEOUS | @ 10:00:00

## 2022-02-14 MED ADMIN — methocarbamoL (ROBAXIN) tablet 500 mg: 500 mg | ORAL | @ 22:00:00

## 2022-02-14 MED ADMIN — nystatin (MYCOSTATIN) oral suspension: 10 mL | ORAL | @ 18:00:00

## 2022-02-14 MED ADMIN — insulin lispro (HumaLOG) injection 10 Units: 10 [IU] | SUBCUTANEOUS | @ 18:00:00

## 2022-02-14 MED ADMIN — mycophenolate (CELLCEPT) tablet 1,000 mg: 1000 mg | ORAL

## 2022-02-14 MED ADMIN — insulin NPH (HumuLIN,NovoLIN) injection 16 Units: 16 [IU] | SUBCUTANEOUS

## 2022-02-14 MED ADMIN — oxyCODONE (ROXICODONE) immediate release tablet 10 mg: 10 mg | ORAL | @ 04:00:00 | Stop: 2022-02-25

## 2022-02-14 MED ADMIN — oxyCODONE (ROXICODONE) immediate release tablet 10 mg: 10 mg | ORAL | @ 16:00:00 | Stop: 2022-02-25

## 2022-02-14 MED ADMIN — melatonin tablet 3 mg: 3 mg | ORAL | @ 22:00:00

## 2022-02-14 MED ADMIN — predniSONE (DELTASONE) tablet 10 mg: 10 mg | ORAL | @ 12:00:00 | Stop: 2022-02-14

## 2022-02-14 MED ADMIN — tamsulosin (FLOMAX) 24 hr capsule 0.4 mg: .4 mg | ORAL

## 2022-02-14 MED ADMIN — methocarbamoL (ROBAXIN) tablet 500 mg: 500 mg | ORAL | @ 10:00:00

## 2022-02-14 MED ADMIN — nystatin (MYCOSTATIN) oral suspension: 10 mL | ORAL | @ 10:00:00

## 2022-02-14 MED ADMIN — insulin lispro (HumaLOG) injection 0-20 Units: 0-20 [IU] | SUBCUTANEOUS | @ 04:00:00

## 2022-02-14 MED ADMIN — methocarbamoL (ROBAXIN) tablet 500 mg: 500 mg | ORAL | @ 15:00:00

## 2022-02-14 MED ADMIN — acetaminophen (TYLENOL) tablet 650 mg: 650 mg | ORAL | @ 12:00:00

## 2022-02-14 MED ADMIN — methocarbamoL (ROBAXIN) tablet 500 mg: 500 mg | ORAL

## 2022-02-14 NOTE — Unmapped (Signed)
Standing LC orders

## 2022-02-14 NOTE — Unmapped (Signed)
Endocrine Team Diabetes Follow Up Consult Note     Consult information:  Requesting Attending Physician : Particia Nearing, MD  Service Requesting Consult : Surg Transplant [facility])  Primary Care Provider: Loman Brooklyn, MD  Impression:  Matthew Key is a 61 y.o. male admitted for OLT. We have been consulted at the request of Particia Nearing, MD to evaluate Chirag for hyperglycemia.     Medical Decision Making:  Diagnoses:  1.Type 2 Diabetes. Uncontrolled With hyperglycemia.  2. Nutrition: Complicating glycemic control. Increasing risk for both hypoglycemia and hyperglycemia.  3. Obesity. Complicating glycemic control and increasing risk for hyperglycemia.  4. Transplant. Complicating glycemic control and increasing risk for hyperglycemia.  5. Steroids. Complicating glycemic control and increasing risk for hyperglycemia.  6. History of secondary adrenal insufficiency.  7. Kidney disease. Complicating glycemic control and increasing risk for hypoglycemia.      Studies reviewed 02/14/22:  Labs: CBC, BMP, POCT-BG  Interpretation: Anemia noted. Elevated creatinine with decreased GFR consistent with kidney disease. Glucose trend revealed hyperglycemia, but improving.    Notes reviewed: Primary team and nursing notes      Overall impression based on above reviews and history:  Type 2 diabetes with hyperglycemia. Given BG trending to goal and steroids continuing to taper will monitor todays trends and adjust later if warranted.     Recommendations:  Type 2 Diabetes:  - NPH 16 units q12h  - lispro 10 AC  - lispro target 140, ISF 30 achs  - Hypoglycemia protocol.  - POCT-BG achs.  - Ensure patient is on glucose precautions if patient taking nutrition by mouth.     Discharge planning:  Last dose of prednisone 10mg  was today, 7/6. To take prednisone 5mg  from 7/7-7/12, then 2.5mg  for 15 days. Will discharge on home basal and add nutritional/correctional coverage. In the future would consider restarting ozempic due to CV benefits. As noted below, it was discontinued due to hypotension, however was likely multifactorial. Discharge med rec updated.     Discharge plan: med rec updated 7/6  While on prednisone 5mg :  tresiba 30u every day  Novolog 8 units qAC and 2:50>150 achs (scale below)      While on prednisone 2.5mg :  Tresiba 30u every day  novolog to 4 units qAC and 2:50>150 achs (scale below)      Correction scale:  If pre-meal or bedtime BG 151-200mg /dL add 2 units, if 161-$WRUEAVWUJWJXBJYN_WGNFAOZHYQMVHQIONGEXBMWUXLKGMWNU$$UVOZDGUYQIHKVQQV_ZDGLOVFIEPPIRJJOACZYSAYTKZSWFUXN$ /dL add 4 units, if BG 235-$TDDUKGURKYHCWCBJ_SEGBTDVVOHYWVPXTGGYIRSWNIOEVOJJK$$KXFGHWEXHBZJIRCV_ELFYBOFBPZWCHENIDPOEUMPNTIRWERXV$ /dL add 6 units, etc.          2. History of Secondary Adrenal Insufficiency:  -Normal HPA axis noted in January 2023. Off steroids since. Continue taper as scheduled.    Thank you for this consult. Discussed plan with primary team. We will continue to follow and make recommendations and place orders as appropriate.    Please page with questions or concerns: Samvel Zinn, NP: [phone]  DCT on call from 6AM - 3PM on weekdays then endocrine fellow on call: [phone] from 3PM - 6AM on weekdays and on weekends and holidays.   If APP cannot be reached, please page the endocrine fellow on call.      Subjective:  Interval history:  The patient reports no acute complaints. PO intake decent. States he is drinking less ensure because they sent up strawberry. Denies complaints at time of rounds.      Initial HPI:  Matthew Key is a 61 y.o. 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     Endocrine History: History of secondary adrenal insufficiency. Had been receiving steroids steroids for spinal injections. We saw back in January of this year and noted HPA axis recovery based on ACTH stim test (cortisol went from 8.4-> 22.9). Since then he was prescribed hydrocortisone 10mg  every day prn illness, but states he hasn't taken any.    Diabetes History:  Patient has a history of Type 2 diabetes diagnosed around 2017.  Diabetes is managed by: PCP  Current home diabetes regimen: tresiba 30 units at bedtime. He was on ozempic 1mg  qweek, but was stopped on 6/19 due significant hypotension (DBP in the 30's per pt). He was admitted and it was noted ozempic was held for AKI.   Current home blood glucose monitoring:   1-2 times a day.  Hypoglycemia awareness: yes.  Complications related to diabetes: unknown. Hx of AKI earlier this month. Initially thought to be due to HRS or ATN, but later thought to be pre-renal.       Current Nutrition:  Active Orders   Diet    Nutrition Therapy Regular/House; No Carbonation       ROS: As per HPI.     gabapentin  300 mg Oral Nightly    insulin lispro  0-20 Units Subcutaneous ACHS    insulin lispro  10 Units Subcutaneous 3xd Meals    insulin NPH  16 Units Subcutaneous Q12H    melatonin  3 mg Oral QPM    methocarbamoL  500 mg Oral QID    mycophenolate  1,000 mg Oral BID    nystatin  10 mL Oral Q8H SCH    pantoprazole  40 mg Oral Daily    predniSONE  10 mg Oral Daily    Followed by    Melene Muller ON 02/15/2022] predniSONE  5 mg Oral Daily    Followed by    Melene Muller ON 02/21/2022] predniSONE  2.5 mg Oral Daily    sulfamethoxazole-trimethoprim  1 tablet Oral Once per day on Mon Wed Fri    tacrolimus  9 mg Oral BID    tamsulosin  0.4 mg Oral Nightly    valGANciclovir  450 mg Oral Daily       Current Outpatient Medications   Medication Instructions    acetaminophen (TYLENOL) 650 mg, Oral, Every 6 hours PRN    atorvastatin (LIPITOR) 10 mg, Oral, Nightly    ciprofloxacin HCl (CIPRO) 500 MG tablet TAKE 1 TABLET BY MOUTH IN THE MORNING    furosemide (LASIX) 40 mg, Oral, Daily (standard), Restart on 6/23    gabapentin (NEURONTIN) 300 mg, Oral, At bedtime    hydrOXYzine (VISTARIL) 50 mg, Oral, Nightly PRN    insulin syringe-needle U-100 1 mL 31 gauge x 5/16 (8 mm) Syrg Use once as needed for Hydrocortisone injection.Marland Kitchen    lactulose 20 g, Oral, 3 times a day (standard)    mycophenolate (CELLCEPT) 1,000 mg, Oral, 2 times a day (standard)    ondansetron (ZOFRAN-ODT) 4 mg, Oral, Every 8 hours PRN    pantoprazole (PROTONIX) 40 mg, Oral, Daily (standard)    pen needle, diabetic (BD ULTRA-FINE NANO PEN NEEDLE) 32 gauge x 5/32 (4 mm) Ndle ok to sub any brand or size needle preferred by insurance/patient, use 1-2x/day, dx E11.65    sodium chloride (AYR) 0.65 % Drop 1 spray, Every 4 hours PRN    spironolactone (ALDACTONE) 50 mg, Oral, Daily (  standard), Restart 6/23    syringe with needle 3 mL 23 x 1 Syrg Use as needed for Hydrocortisone injection    tacrolimus (PROGRAF) 5 mg, Oral, 2 times a day    TRESIBA FLEXTOUCH U-100 30 Units, Subcutaneous, At bedtime, Adjust as instructed.    valGANciclovir (VALCYTE) 450 mg, Oral, Daily (standard)    zinc sulfate 110 mg, Oral, Daily           Past Medical History:   Diagnosis Date    AKI (acute kidney injury) (CMS-HCC) 12/14/2020    Anxiety 10/22/2013    Arthritis     Cervical radiculopathy 12/03/2016    Chronic pain disorder     Lower back    Cirrhosis (CMS-HCC)     Dental abscess 10/2020    Duodenal ulcer 12/01/2017    GERD (gastroesophageal reflux disease)     History of transfusion     Hyperlipidemia 10/22/2013    Hypertension     under control with meds and weight loss    Hypotension 01/29/2022    Liver disease     Sleep apnea, obstructive     Have machine    Stroke (CMS-HCC)     mild stroke    Type 2 diabetes mellitus with diabetic neuropathy, with long-term current use of insulin (CMS-HCC) 06/09/2014       Past Surgical History:   Procedure Laterality Date    KNEE SURGERY      PR CATH PLACE/CORON ANGIO, IMG SUPER/INTERP,R&L HRT CATH, L HRT VENTRIC N/A 12/19/2020    Procedure: CATH LEFT/RIGHT HEART CATHETERIZATION;  Surgeon: Rosana Hoes, MD;  Location: [facility] CATH;  Service: Cardiology    PR COLONOSCOPY FLX DX W/COLLJ SPEC WHEN PFRMD N/A 12/07/2021    Procedure: COLONOSCOPY, FLEXIBLE, PROXIMAL TO SPLENIC FLEXURE; DIAGNOSTIC, W/WO COLLECTION SPECIMEN BY BRUSH OR WASH;  Surgeon: Annie Paras, MD;  Location: GI [facility];  Service: Gastroenterology    PR TRANSPLANT LIVER,ALLOTRANSPLANT N/A 02/06/2022    Procedure: LIVER ALLOTRANSPLANTATION; ORTHOTOPIC, PARTIAL OR WHOLE, FROM CADAVER OR LIVING DONOR, ANY AGE;  Surgeon: Florene Glen, MD;  Location: MAIN OR [place];  Service: Transplant    PR TRANSPLANT,PREP DONOR LIVER/ARTERIAL N/A 02/06/2022    Procedure: BACKBNCH RECONSTRUCT OF CAD/LIVE DONOR LIVER GFT PRIOR TRANSPLANT; ARTERIAL ANASTAMOSIS, EA;  Surgeon: Florene Glen, MD;  Location: MAIN OR [place];  Service: Transplant    PR UPPER GI ENDOSCOPY,BIOPSY N/A 10/12/2020    Procedure: UGI ENDOSCOPY; WITH BIOPSY, SINGLE OR MULTIPLE;  Surgeon: Marene Lenz, MD;  Location: [facility];  Service: Gastroenterology    PR UPPER GI ENDOSCOPY,DIAGNOSIS N/A 01/03/2022    Procedure: UGI ENDO, INCLUDE ESOPHAGUS, STOMACH, & DUODENUM &/OR JEJUNUM; DX W/WO COLLECTION SPECIMN, BY BRUSH OR WASH;  Surgeon: Marene Lenz, MD;  Location: [facility];  Service: Gastroenterology    PR UPPER GI ENDOSCOPY,LIGAT VARIX N/A 12/07/2021    Procedure: UGI ENDO; Everlene Balls LIG ESOPH &/OR GASTRIC VARICES;  Surgeon: Annie Paras, MD;  Location: GI [facility];  Service: Gastroenterology    ROOT CANAL      Front teeth       Family History   Problem Relation Age of Onset    Edema Mother     Alzheimer's disease Father     Aortic dissection Brother     Early death Brother     Aneurysm Brother        Social History     Tobacco Use  Smoking status: Never    Smokeless tobacco: Never   Vaping Use    Vaping Use: Never used   Substance Use Topics    Alcohol use: Not Currently    Drug use: Not Currently       OBJECTIVE:  BP 128/65  - Pulse 70  - Temp 36.7 ??C (98.1 ??F) (Oral)  - Resp 18  - Ht 172.7 cm (5' 7.99)  - Wt 94.8 kg (209 lb)  - SpO2 96%  - BMI 31.79 kg/m??   Wt Readings from Last 12 Encounters:   02/11/22 94.8 kg (209 lb)   01/29/22 96.9 kg (213 lb 11.2 oz)   01/14/22 89.8 kg (198 lb)   01/03/22 88.9 kg (196 lb)   12/31/21 90.4 kg (199 lb 6.4 oz)   12/11/21 90 kg (198 lb 7.1 oz)   12/07/21 89.8 kg (198 lb)   11/26/21 92.1 kg (203 lb)   10/23/21 100.7 kg (222 lb 0.1 oz)   10/08/21 90 kg (198 lb 8 oz)   10/01/21 92.4 kg (203 lb 12.8 oz)   09/10/21 94.3 kg (208 lb)     Physical Exam  Constitutional:       Appearance: Normal appearance.   Pulmonary:      Effort: Pulmonary effort is normal. No respiratory distress.   Neurological:      Mental Status: He is alert.   Psychiatric:         Mood and Affect: Mood normal.         Behavior: Behavior normal.           BG/insulin reviewed per EMR.   Glucose, POC (mg/dL)   Date Value   16/05/9603 177   02/13/2022 219 (H)   02/13/2022 226 (H)   02/13/2022 196 (H)   02/13/2022 136   02/12/2022 202 (H)   02/12/2022 276 (H)   02/12/2022 198 (H)        Summary of labs:  Lab Results   Component Value Date    A1C 5.2 02/05/2022    A1C 4.7 (L) 01/14/2022    A1C 4.6 (L) 12/11/2021     Lab Results   Component Value Date    CREATININE 1.77 (H) 02/14/2022     Lab Results   Component Value Date    WBC 9.1 02/14/2022    HGB 9.5 (L) 02/14/2022    HCT 26.9 (L) 02/14/2022    PLT 138 (L) 02/14/2022       Lab Results   Component Value Date    NA 135 02/14/2022    K 4.3 02/14/2022    CL 100 02/14/2022    CO2 30.0 02/14/2022    BUN 37 (H) 02/14/2022    CREATININE 1.77 (H) 02/14/2022    GLU 177 02/14/2022    CALCIUM 8.7 02/14/2022    MG 2.2 02/14/2022    PHOS 4.1 02/14/2022       Lab Results   Component Value Date    BILITOT 0.8 02/14/2022    BILIDIR 0.40 (H) 02/14/2022    PROT 5.5 (L) 02/14/2022    ALBUMIN 3.3 (L) 02/14/2022    ALT 117 (H) 02/14/2022    AST 30 02/14/2022    ALKPHOS 112 02/14/2022    GGT 217 (H) 02/14/2022

## 2022-02-14 NOTE — Unmapped (Cosign Needed)
Transplant Surgery Progress Note    Date of service: 02/14/2022    Hospital Day:  LOS: 5 days   Surgery Date(s): 02/06/22    Interval History: NAEON. Afebrile and stable on RA. BP soft (104/84 on 7/5 PM) relative to baseline but stable throughout the night 120-130 systolics. Alert and oriented. Pain controlled, had 6 counts of voiding not recorded output. 3x weekly with OT per 7/3 note. Decreased drainage from superior part of mercedes incision. Incision looks clean. Intact, and healing well. Drains continue to have large output.       Assessment/Plan:  Matthew Key is a 61 y.o. 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     Neurological:    - MMPC    Cardiovascular:   - CTM; floor with tele    Pulmonary:   *Extubated on 02/07/22, now on room air  - Pulm toilet/OOB    Renal/Genitourinary:  *Strict I&Os  - Trend BUN/Cr   - passed TOV  - Flomax    GI/Nutrition:  F: ML  E: Replete PRN   N: regular diet    *S/p OLT on 02/06/22  - Daily labs - AST/ALT/Alk Phos/GGT mildly improved  - Liver transplant doppler: WNL x2 (most recent 02/11/22)  - Surgical drains to bulb suction x2 - continues to have dark-serosanguinous drainage with no clots.   - 25% albumin BID  - plan to DC lateral drain today    Heme:   DVT ppx: SQH BID held   - Trend PLT (improving)  - Hgb stable s/p 1 unit pRBC     Induction immunosuppression: methylpred and basiliximab  Maintenance immunosuppression: MMF and tac    ID:  - No abx indicated    PT recs 3 x weekly (7/5)  OT recs 3 x weekly (7/3)    Dispo: Floor    Objective:    Physical Exam:    General: NAD  CV: Regular rate per telemetry  Resp: NWOB on room air  Abdomen: Soft, mildly distended, appropriately tender, surgical drains w/ dark-serosanguinous drainage with no clots output x2. Mercedes incision clean and intact, dark-serosanguinous drainage with no clots on dressings  Extremities: WWP    Data Review:   I have reviewed the labs and studies from the last 24 hours.    Vitals Reviewed:    Temp:  [36.7 ??C (98.1 ??F)-37.1 ??C (98.8 ??F)] 36.7 ??C (98.1 ??F)  Heart Rate:  [70-85] 73  Resp:  [16-18] 18  BP: (104-132)/(55-84) 130/55  MAP (mmHg):  [76-93] 76  SpO2:  [96 %-99 %] 98 %   Temp (24hrs), Avg:36.9 ??C (98.4 ??F), Min:36.7 ??C (98.1 ??F), Max:37.1 ??C (98.8 ??F)     SpO2: 98 %   Height: 172.7 cm (5' 7.99)    Weight: 94.8 kg (209 lb)    Body mass index is 31.79 kg/m??.    Body surface area is 2.13 meters squared.       Intake/Output Summary (Last 24 hours) at 02/14/2022 1137  Last data filed at 02/14/2022 0732  Gross per 24 hour   Intake 480.42 ml   Output 975 ml   Net -494.58 ml        I/O last 3 completed shifts:  In: 960.4 [P.O.:600; Blood:360.4]  Out: 1880 [Drains:1880]   I/O this shift:  In: 120 [P.O.:120]  Out: 175 [Drains:175]  Tubes and Drains:  Patient Lines/Drains/Airways Status       Active Active Lines, Drains, & Airways       Name Placement date Placement time Site Days    Closed/Suction Drain 1 Anterior RLQ Bulb 10 Fr. 02/06/22  1052  RLQ  8    Closed/Suction Drain 2 Anterior;Lateral RLQ Bulb 10 Fr. 02/06/22  1150  RLQ  7    Peripheral IV 02/10/22 Anterior;Right Forearm 02/10/22  1401  Forearm  3                    Warrick Parisian, MD  PGY-1 General Surgery

## 2022-02-14 NOTE — Unmapped (Addendum)
Pharmacist Discharge Note for  Liver Transplant Recipient  Date of admission to [facility]: 02/05/2022  Reason for writing this note: new diagnosis with new medication, patient requires medication-related outpatient intervention and/or monitoring    Reason for Admission: s/p liver transplant on 02/06/22 due to cryptogenic (probably NASH)  Induction: Basiliximab and methylpred (did not delay start of tac)  MELD 29, DBD  PMH: CVA, HLD, OSA on CPAP, T2M, HTN  Explant liver biopsy: The explant shows developed cirrhosis with only minimal mixed macro- and microvesicular steatosis (<5%). Hepatocyte cholestasis and nuclear glycogenation are seen.The fibrous septa show ductular reaction and chronic, predominantly lymphocytic, inflammatory infiltrate.    Discharge Date: 02/15/22    Past Medical History:   Diagnosis Date    AKI (acute kidney injury) (CMS-HCC) 12/14/2020    Anxiety 10/22/2013    Arthritis     Cervical radiculopathy 12/03/2016    Chronic pain disorder     Lower back    Cirrhosis (CMS-HCC)     Dental abscess 10/2020    Duodenal ulcer 12/01/2017    GERD (gastroesophageal reflux disease)     History of transfusion     Hyperlipidemia 10/22/2013    Hypertension     under control with meds and weight loss    Hypotension 01/29/2022    Liver disease     Sleep apnea, obstructive     Have machine    Stroke (CMS-HCC)     mild stroke    Type 2 diabetes mellitus with diabetic neuropathy, with long-term current use of insulin (CMS-HCC) 06/09/2014       Immunosuppression regimen:  Tacrolimus 9 mg BID (dose as of morning of 7/7); goal 8-10 ng/mL  Mycophenolate mofetil 1,000 mg BID  Prednisone 2.5 mg tablets - take 2 tabs daily x 6 days (7/7-7/12), then take 1 tab daily from (7/13-7/28)    Antimicrobials during admission:   CMV: D+/R+ -> Valcyte 450 mg daily x 3 months, end date: 05/09/22  PJP: Bactrim SS MWF x 6 months, end date: 08/08/22  Nystatin inpatient  Peri-op: 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    Medication changes to be instituted upon discharge:  Pertinent new or changes to home medications:  Heme: aspirin 81 mg daily  GI: docusate RN  Pain: oxycodone PRN, methocarbamol PRN  Lytes: Mag plus protein - on hold  Endo: Novolog 4 units with meals + correctional insulin, Tresiba dose changed to 30 units daily;glucagon spray PRN  GU: tamsulosin 0.4 mg nightly  Sleep: melatonin    Continued home medications:  - pantoprazole, gabapentin nightly, apap and hydroxyzine PRN    Home medications stopped: atorvastatin, ciprofloxacin, furosemide, Zofran, spironolactone, zinc    Medication related barriers: none    Potential adverse effects during admission  Since last visit, does patient have YES NO Tac Dose Modification NOTES      Increase Decrease No Change    1 Neurotoxicity (tremor, paresthesias, tingling, seizures, or headache) [x]  []  []  []  [x]  Patient beginning to have jerks/twitches post-operatively during week of 7/3   2 Nephrotoxicity related to tacrolimus []  [x]  []  []  [x]     3 Diarrhea, constipation []  [x]  []  []  [x]     4 Peripheral edema []  [x]  []  []  [x]     5 Hypertension []  [x]  []  []  [x]     6 Hyperglycemia [x]  []  []  []  [x]     7 Other adverse events []  [x]  []  []  [x]         Suggested monitoring for outpatient follow-up:  Immunosuppression/liver: Tac level remains subtherapeutic, along level is rising. Tac was started post-op and dose was intentionally not changed until second dose of basiliximab was given. Continue to monitor tac levels and tolerance. As the tac level has increased this week, there was more mention of patient having jerks/twitches. Patient's overall liver function is improving although AST/ALT has fluctuated on 7/5-7/7. Plan to discuss explant biopsy at liver pathology to determine prednisone duration/dose.  Endo: Endo consulted during inpatient stay and will be discharged on Tresiba 30 units daily with Novolog meal time insulin and correctional insulin with meals.Continue to monitor insulin needs with prednisone taper above.    [name],  PharmD

## 2022-02-14 NOTE — Unmapped (Signed)
POC reviewed. VSS. Calm and cooperative with care. Incisions leaking a small amount, covered with ABD pad. JP draining bloody drainage, output remains high and drains dumping. Up to bathroom with wheeled walker and SB assist. Loose Bms, bowel regimen held. Large bruise present on L flank. No falls/injuries. C/o pain managed with PRN regimen. No further complaints/concerns, will continue with plan of care.   Problem: Adult Inpatient Plan of Care  Goal: Plan of Care Review  Outcome: Progressing  Goal: Patient-Specific Goal (Individualized)  Description: Patient will be able to have bowel movement by day shift on 02/11/2022.   Outcome: Progressing  Goal: Absence of Hospital-Acquired Illness or Injury  Outcome: Progressing  Intervention: Identify and Manage Fall Risk  Recent Flowsheet Documentation  Taken 02/13/2022 2000 by Kae Heller, RN  Safety Interventions: environmental modification  Intervention: Prevent Skin Injury  Recent Flowsheet Documentation  Taken 02/13/2022 2000 by Kae Heller, RN  Skin Protection: adhesive use limited  Intervention: Prevent and Manage VTE (Venous Thromboembolism) Risk  Recent Flowsheet Documentation  Taken 02/13/2022 2000 by Kae Heller, RN  Activity Management:   ambulated in room   ambulated to bathroom  VTE Prevention/Management: ambulation promoted  Intervention: Prevent Infection  Recent Flowsheet Documentation  Taken 02/13/2022 2000 by Kae Heller, RN  Infection Prevention: environmental surveillance performed  Goal: Optimal Comfort and Wellbeing  Outcome: Progressing  Goal: Readiness for Transition of Care  Outcome: Progressing  Goal: Rounds/Family Conference  Outcome: Progressing     Problem: Infection  Goal: Absence of Infection Signs and Symptoms  Outcome: Progressing  Intervention: Prevent or Manage Infection  Recent Flowsheet Documentation  Taken 02/13/2022 2000 by Kae Heller, RN  Infection Management: aseptic technique maintained  Isolation Precautions: protective precautions maintained     Problem: Impaired Wound Healing  Goal: Optimal Wound Healing  Outcome: Progressing  Intervention: Promote Wound Healing  Recent Flowsheet Documentation  Taken 02/13/2022 2000 by Kae Heller, RN  Activity Management:   ambulated in room   ambulated to bathroom     Problem: Skin Injury Risk Increased  Goal: Skin Health and Integrity  Outcome: Progressing  Intervention: Optimize Skin Protection  Recent Flowsheet Documentation  Taken 02/13/2022 2000 by Kae Heller, RN  Pressure Reduction Techniques: frequent weight shift encouraged  Pressure Reduction Devices: pressure-redistributing mattress utilized  Skin Protection: adhesive use limited     Problem: Self-Care Deficit  Goal: Improved Ability to Complete Activities of Daily Living  Outcome: Progressing     Problem: Fall Injury Risk  Goal: Absence of Fall and Fall-Related Injury  Outcome: Progressing  Intervention: Promote Injury-Free Environment  Recent Flowsheet Documentation  Taken 02/13/2022 2000 by Kae Heller, RN  Safety Interventions: environmental modification

## 2022-02-15 LAB — BASIC METABOLIC PANEL
ANION GAP: 5 mmol/L (ref 5–14)
BLOOD UREA NITROGEN: 35 mg/dL — ABNORMAL HIGH (ref 9–23)
BUN / CREAT RATIO: 21
CALCIUM: 8.5 mg/dL — ABNORMAL LOW (ref 8.7–10.4)
CHLORIDE: 104 mmol/L (ref 98–107)
CO2: 30 mmol/L (ref 20.0–31.0)
CREATININE: 1.67 mg/dL — ABNORMAL HIGH
EGFR CKD-EPI (2021) MALE: 46 mL/min/{1.73_m2} — ABNORMAL LOW (ref >=60)
GLUCOSE RANDOM: 85 mg/dL (ref 70–179)
POTASSIUM: 4.5 mmol/L (ref 3.4–4.8)
SODIUM: 139 mmol/L (ref 135–145)

## 2022-02-15 LAB — DECEASED DONOR CL I&II, LOW RES
DONOR LOW RES DRW #1: 52
DONOR LOW RES DRW #2: 53
DONOR LOW RES HLA A #1: 3
DONOR LOW RES HLA A #2: 32
DONOR LOW RES HLA B #1: 7
DONOR LOW RES HLA B #2: 45
DONOR LOW RES HLA BW #1: 6
DONOR LOW RES HLA C #1: 6
DONOR LOW RES HLA C #2: 7
DONOR LOW RES HLA DQ #1: 7
DONOR LOW RES HLA DQ #2: 8
DONOR LOW RES HLA DR #1: 4
DONOR LOW RES HLA DR #2: 11

## 2022-02-15 LAB — MAGNESIUM: MAGNESIUM: 2 mg/dL (ref 1.6–2.6)

## 2022-02-15 LAB — CBC
HEMATOCRIT: 25.9 % — ABNORMAL LOW (ref 39.0–48.0)
HEMOGLOBIN: 9.2 g/dL — ABNORMAL LOW (ref 12.9–16.5)
MEAN CORPUSCULAR HEMOGLOBIN CONC: 35.4 g/dL (ref 32.0–36.0)
MEAN CORPUSCULAR HEMOGLOBIN: 32.6 pg — ABNORMAL HIGH (ref 25.9–32.4)
MEAN CORPUSCULAR VOLUME: 92.2 fL (ref 77.6–95.7)
MEAN PLATELET VOLUME: 9.8 fL (ref 6.8–10.7)
PLATELET COUNT: 148 10*9/L — ABNORMAL LOW (ref 150–450)
RED BLOOD CELL COUNT: 2.81 10*12/L — ABNORMAL LOW (ref 4.26–5.60)
RED CELL DISTRIBUTION WIDTH: 18.5 % — ABNORMAL HIGH (ref 12.2–15.2)
WBC ADJUSTED: 6.8 10*9/L (ref 3.6–11.2)

## 2022-02-15 LAB — HEPATIC FUNCTION PANEL
ALBUMIN: 3.1 g/dL — ABNORMAL LOW (ref 3.4–5.0)
ALKALINE PHOSPHATASE: 113 U/L (ref 46–116)
ALT (SGPT): 120 U/L — ABNORMAL HIGH (ref 10–49)
AST (SGOT): 44 U/L — ABNORMAL HIGH (ref <=34)
BILIRUBIN DIRECT: 0.4 mg/dL — ABNORMAL HIGH (ref 0.00–0.30)
BILIRUBIN TOTAL: 0.8 mg/dL (ref 0.3–1.2)
PROTEIN TOTAL: 5 g/dL — ABNORMAL LOW (ref 5.7–8.2)

## 2022-02-15 LAB — PHOSPHORUS: PHOSPHORUS: 4.5 mg/dL (ref 2.4–5.1)

## 2022-02-15 LAB — GAMMA GT: GAMMA GLUTAMYL TRANSFERASE: 218 U/L — ABNORMAL HIGH

## 2022-02-15 LAB — PROTIME-INR
INR: 0.94
PROTIME: 10.6 s (ref 9.8–12.8)

## 2022-02-15 LAB — TACROLIMUS LEVEL, TROUGH: TACROLIMUS, TROUGH: 6.4 ng/mL (ref 5.0–15.0)

## 2022-02-15 MED ORDER — ACETAMINOPHEN 325 MG TABLET
ORAL_TABLET | Freq: Four times a day (QID) | ORAL | 11 refills | 8 days | Status: CP | PRN
Start: 2022-02-15 — End: ?
  Filled 2022-02-15: qty 60, 8d supply, fill #0
  Filled 2022-02-15: qty 30, 30d supply, fill #0

## 2022-02-15 MED ORDER — SULFAMETHOXAZOLE 400 MG-TRIMETHOPRIM 80 MG TABLET
ORAL_TABLET | ORAL | 5 refills | 28.00000 days | Status: CP
Start: 2022-02-15 — End: ?
  Filled 2022-02-15: qty 12, 28d supply, fill #0

## 2022-02-15 MED ORDER — GLUCAGON 3 MG/ACTUATION NASAL SPRAY
NASAL | 1 refills | 0 days | Status: CP | PRN
Start: 2022-02-15 — End: 2023-02-10
  Filled 2022-02-15: qty 2, 1d supply, fill #0

## 2022-02-15 MED ADMIN — predniSONE (DELTASONE) tablet 5 mg: 5 mg | ORAL | @ 12:00:00 | Stop: 2022-02-15

## 2022-02-15 MED ADMIN — methocarbamoL (ROBAXIN) tablet 500 mg: 500 mg | ORAL | @ 17:00:00 | Stop: 2022-02-15

## 2022-02-15 MED ADMIN — insulin NPH (HumuLIN,NovoLIN) injection 16 Units: 16 [IU] | SUBCUTANEOUS | @ 12:00:00 | Stop: 2022-02-15

## 2022-02-15 MED ADMIN — insulin lispro (HumaLOG) injection 10 Units: 10 [IU] | SUBCUTANEOUS

## 2022-02-15 MED ADMIN — nystatin (MYCOSTATIN) oral suspension: 10 mL | ORAL | @ 02:00:00

## 2022-02-15 MED ADMIN — tacrolimus (PROGRAF) 9 mg combo product: 9 mg | ORAL | @ 12:00:00 | Stop: 2022-02-15

## 2022-02-15 MED ADMIN — tamsulosin (FLOMAX) 24 hr capsule 0.4 mg: .4 mg | ORAL

## 2022-02-15 MED ADMIN — gabapentin (NEURONTIN) capsule 300 mg: 300 mg | ORAL

## 2022-02-15 MED ADMIN — methocarbamoL (ROBAXIN) tablet 500 mg: 500 mg | ORAL | @ 12:00:00 | Stop: 2022-02-15

## 2022-02-15 MED ADMIN — mycophenolate (CELLCEPT) tablet 1,000 mg: 1000 mg | ORAL | @ 12:00:00 | Stop: 2022-02-15

## 2022-02-15 MED ADMIN — oxyCODONE (ROXICODONE) immediate release tablet 10 mg: 10 mg | ORAL | @ 02:00:00 | Stop: 2022-02-25

## 2022-02-15 MED ADMIN — insulin lispro (HumaLOG) injection 0-20 Units: 0-20 [IU] | SUBCUTANEOUS

## 2022-02-15 MED ADMIN — insulin lispro (HumaLOG) injection 10 Units: 10 [IU] | SUBCUTANEOUS | @ 17:00:00 | Stop: 2022-02-15

## 2022-02-15 MED ADMIN — insulin lispro (HumaLOG) injection 10 Units: 10 [IU] | SUBCUTANEOUS | @ 12:00:00 | Stop: 2022-02-15

## 2022-02-15 MED ADMIN — methocarbamoL (ROBAXIN) tablet 500 mg: 500 mg | ORAL | @ 02:00:00

## 2022-02-15 MED ADMIN — sulfamethoxazole-trimethoprim (BACTRIM) 400-80 mg tablet 80 mg of trimethoprim: 1 | ORAL | @ 12:00:00 | Stop: 2022-02-15

## 2022-02-15 MED ADMIN — nystatin (MYCOSTATIN) oral suspension: 10 mL | ORAL | @ 12:00:00 | Stop: 2022-02-15

## 2022-02-15 MED ADMIN — nystatin (MYCOSTATIN) oral suspension: 10 mL | ORAL | @ 17:00:00 | Stop: 2022-02-15

## 2022-02-15 MED ADMIN — insulin NPH (HumuLIN,NovoLIN) injection 16 Units: 16 [IU] | SUBCUTANEOUS

## 2022-02-15 MED ADMIN — valGANciclovir (VALCYTE) tablet 450 mg: 450 mg | ORAL | @ 12:00:00 | Stop: 2022-02-15

## 2022-02-15 MED ADMIN — pantoprazole (PROTONIX) EC tablet 40 mg: 40 mg | ORAL | @ 12:00:00 | Stop: 2022-02-15

## 2022-02-15 MED ADMIN — oxyCODONE (ROXICODONE) immediate release tablet 10 mg: 10 mg | ORAL | @ 12:00:00 | Stop: 2022-02-15

## 2022-02-15 MED ADMIN — mycophenolate (CELLCEPT) tablet 1,000 mg: 1000 mg | ORAL

## 2022-02-15 MED ADMIN — insulin lispro (HumaLOG) injection 0-20 Units: 0-20 [IU] | SUBCUTANEOUS | @ 17:00:00 | Stop: 2022-02-15

## 2022-02-15 MED FILL — UNIFINE PENTIPS 32 GAUGE X 5/32" NEEDLE (4 MM): 50 days supply | Qty: 100 | Fill #0

## 2022-02-15 NOTE — Unmapped (Signed)
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 *      OR Procedures:    BACKBNCH RECONSTRUCT OF CAD/LIVE DONOR LIVER GFT PRIOR TRANSPLANT; ARTERIAL ANASTAMOSIS, EA  LIVER ALLOTRANSPLANTATION; ORTHOTOPIC, PARTIAL OR WHOLE, FROM CADAVER OR LIVING DONOR, ANY AGE  Date  02/06/2022  -------------------     Ancillary Procedures: no procedures    Discharge Day Services: Removal of last surgical drain.    Subjective   No acute events overnight. Afebrile and stable. Tolerating regular diet and having BM and voiding independently. Passed PT/OT    Objective   Patient Vitals for the past 8 hrs:   BP Temp Temp src Pulse Resp SpO2   02/15/22 1119 124/60 36.9 ??C (98.4 ??F) Oral 77 16 98 %   02/15/22 0728 147/65 36.8 ??C (98.2 ??F) Oral 83 14 99 %     I/O this shift:  In: -   Out: 610 [Urine:300; Drains:310]    General Appearance:   No acute distress  Lungs:             Unlabored breathing on RA, symmetric chest rise  Heart:                           HDS  Abdomen:                Soft, non-tender, mildly distended. Mercedes incision with intact staples . Interval removal of surgical drains.  Extremities:              Warm and well perfused    Hospital Course:  Matthew Key is a 61 y.o. 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 Celine Mans.    Intraoperative findings are significant for mild ascites, neovascularization from the diaphragm, and a cirrhotic liver.    #Liver Transplant  Patient transferred to SICU intubated for routine post-liver transplant cares.  On postoperative day 1 patient was passing SBT and was extubated to nasal cannula without consequence.  Patient progressed well while in the surgical ICU with LFTs improving and liver transplant ultrasound within normal limits. Transferred to ISCU on 7/1 and then Floor on 7/2. LFTs downtrended thru 7/2, then fluctuated. Repeat LUS 7/3 with patent vasculature.     #Urinary retention  Patient failed initial trial of void.  Patient was initiated on Flomax and Foley was replaced for 3 days and repeated on 02/12/2022 which patient passed.    At the time of discharge the patient was voiding, tolerating a Nutrition Therapy Regular/House; No Carbonation diet, was ambulatory, and reported good pain control with PO medications. The patient will be discharged home on  LOS: 5 days  8 Days Post-Op in good condition.       JPs/Acites: 2 JPs palced intra-op, both with high ascitic output. Both JPs removed prior  to discharge. Remained high ascitic output.  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 Has not required iHD post op. Cr 1.67 at discharge, voiding 7x daily (unmeasured voids).   Allograft Function: DBD OLT on 6/28   -post op LUS WNL  -LFTs downtrended thru 7/2, then fluctuated. Repeat LUS 7/3 with patent vasculature.   Immunosuppression:  -Induction: Basiliximab (Simulect) and Methylpred  -Tac at dc: 9mg  BID  -Cellcept at dc: 1000mg  BID  -Pred at dc: taper down to 2.5 x 16 doses then off. However consider staying on pred 5 pending pathology review of native liver explant  Prophylaxis:  -PJP: Bactrim SS x 6 months, EOT 08/08/22  -CMV: CMV (D-/R+ or D+/R+): Moderate Risk, 450mg  Valcyte daily x 3 months, renally dose adjusted, EOT 05/09/22  -ASA 81 at dc: yes  Endocrine  -30u tresiba every morning  -4u Nutritional with 2:50>150 SSI    Condition at Discharge: Improved  Discharge Medications:      Medication List      START taking these medications     ACCU-CHEK GUIDE GLUCOSE METER Misc; Generic drug: blood-glucose meter;   Use to test blood sugar as directed   ACCU-CHEK GUIDE TEST STRIPS Strp; Generic drug: blood sugar diagnostic;   Use to check blood sugar as directed with insulin 3 times a day & for   symptoms of high or low blood sugar   ACCU-CHEK SOFTCLIX LANCETS Misc; Generic drug: lancets; Use to check   blood sugar as directed with insulin 3 times a day & for symptoms of high   or low blood sugar.   aspirin 81 MG tablet; Commonly known as: ECOTRIN; Take 1 tablet (81 mg   total) by mouth daily.   BAQSIMI 3 mg/actuation Spry; Generic drug: glucagon; Use 1 spray into   the left nostril every fifteen (15) minutes as needed (low blood sugar).   docusate sodium 100 MG capsule; Commonly known as: COLACE; Take 1   capsule (100 mg total) by mouth two (2) times a day as needed for   constipation.   magnesium (amino acid chelate) 133 mg; Generic drug: magnesium oxide-Mg   AA chelate; Take 1 tablet by mouth two (2) times a day. Hold until   directed to take by nurse coordinator   melatonin 3 mg Tab; Take 1 tablet (3 mg total) by mouth every evening.   methocarbamoL 500 MG tablet; Commonly known as: ROBAXIN; Take 1 tablet   (500 mg total) by mouth Three (3) times a day as needed.   mycophenolate 250 mg capsule; Commonly known as: CELLCEPT; Take 4   capsules (1,000 mg total) by mouth Two (2) times a day.   NovoLOG FlexPen U-100 Insulin 100 unit/mL (3 mL) injection pen; Generic   drug: insulin ASPART; Take 4 units subcutaneous with meals as directed   along with correction if needed. If premeal BG 151-200 take 2 additional   units for correction, if 201-250 take 4 additional units, etc.   oxyCODONE 5 MG immediate release tablet; Commonly known as: ROXICODONE;   Take 1-2 tablets (5-10 mg total) by mouth every six (6) hours as needed   for pain.   predniSONE 2.5 MG tablet; Commonly known as: DELTASONE; Take 2 tablets   (5 mg total) by mouth daily for 5 days, THEN 1 tablet (2.5 mg total) daily   for 16 days.; Start taking  on: February 16, 2022   sulfamethoxazole-trimethoprim 400-80 mg per tablet; Commonly known as:   BACTRIM; Take 1 tablet (80 mg of trimethoprim total) by mouth 3 (three)   times a week.   tacrolimus 1 MG capsule; Commonly known as: PROGRAF; Take 9 capsules (9   mg total) by mouth two (2) times a day.   tamsulosin 0.4 mg capsule; Commonly known as: FLOMAX; Take 1 capsule   (0.4 mg total) by mouth nightly.   valGANciclovir 450 mg tablet; Commonly known as: VALCYTE; Take 1 tablet   (450 mg total) by mouth daily.     CHANGE how you take these medications     acetaminophen 325 MG tablet; Commonly known as: TYLENOL; Take 1-2   tablets (325-650 mg total) by mouth every six (6) hours as needed for pain   or fever.; What changed: how much to take, reasons to take this   hydrOXYzine 50 MG capsule; Commonly known as: VISTARIL; Take 1 capsule   (50 mg total) by mouth nightly as needed for anxiety.; What changed:   reasons to take this   TRESIBA FLEXTOUCH U-100 100 unit/mL (3 mL) Inpn; Generic drug: insulin   degludec; Inject 0.3 mL (30 Units total) under the skin daily. Adjust as   instructed.; What changed: when to take this   UNIFINE PENTIPS 32 gauge x 5/32 (4 mm) Ndle; Generic drug: pen needle,   diabetic; Use as directed  1-2 times per day; What changed: additional   instructions     CONTINUE taking these medications     gabapentin 300 MG capsule; Commonly known as: NEURONTIN; Take 1 capsule   (300 mg total) by mouth at bedtime.   pantoprazole 40 MG tablet; Commonly known as: PROTONIX; Take 1 tablet   (40 mg total) by mouth daily.     STOP taking these medications     atorvastatin 10 MG tablet; Commonly known as: LIPITOR   ciprofloxacin HCl 500 MG tablet; Commonly known as: CIPRO   furosemide 20 MG tablet; Commonly known as: LASIX   insulin syringe-needle U-100 1 mL 31 gauge x 5/16 (8 mm) Syrg   lactulose 10 gram/15 mL solution   ondansetron 4 MG disintegrating tablet; Commonly known as: ZOFRAN-ODT   sodium chloride 0.65 % Drop; Commonly known as: AYR   spironolactone 50 MG tablet; Commonly known as: ALDACTONE   syringe with needle 3 mL 23 x 1 Syrg   zinc sulfate 110 mg (25 mg elemental zinc) Tab       Pending Test Results: None    Discharge Instructions:  Activity: Do not lift > 10-15 lbs for 1st 6 weeks, then gradually increase to 25 lbs over the following 6 weeks. Resume heavy lifting only after being cleared to do so at follow-up appointment in Transplant Surgery clinic.    Diet: regular diet     Other Instructions:  Other Instructions       Discharge instructions      Discharge Instructions:  Activity: Do not lift > 10-15 lbs for 1st 6 weeks, then gradually increase to 25 lbs over the following 6 weeks. Resume heavy lifting only after being cleared to do so at follow-up appointment in Transplant Surgery clinic.    Diet: regular    Diabetes Management:   -Take 4 units subcutaneous with meals as directed along with correction if needed; if premeal BG 151-200 take 2 additional units, if 201-250 take 4 units, etc.     Other Instructions: Wait to take Mag  Plus Protein until seen in clinic and told to start    Your Liver Post-Transplant Coordinator is Emilio Math. Contact your transplant coordinator or the Transplant Surgery Office [phone]) during business hours or page the transplant coordinator on call [phone]) after business hours for:    - fever >100.5 degrees F by mouth, any fever with shaking chills, or other signs or symptoms of infection   - uncontrolled nausea, vomiting, or diarrhea; inability to have a bowel movement for > 3 days.   - any problem that prevents taking medications as scheduled.   - pain uncontrolled with prescribed medication or new pain or tenderness at the surgical site   - sudden weight gain or increase in blood pressure (greater than 140/85)   - shortness of breath, chest pain / discomfort   - new or increasing jaundice   - urinary symptoms including pain / difficulty / burning or tea-colored urine   - any other new or concerning symptoms   - questions regarding your medications or continuing care      Patient may shower (unless using well water, then keep incision covered and dry). If you have a drain, it must be kept dry at all times (it must be covered with a water-occlusive dressing if you shower). Do not immerse wounds in bath or pool. Wash the surgical site with soap and water, do not scrub vigorously.    You may dress wounds with dry gauze and tape to avoid soilage.    Do not drive or operate heavy machinery prior to MD clearance, or at any time while taking narcotics.    Inspect surgical sites at least twice daily, contact Transplant Coordinator for spreading redness, purulent discharge, or increasing bleeding or drainage, or for separation of wounds.     Maintain a written record of daily vital signs, per Handbook instructions.     Maintain a written record of medications taken and review against the discharge medications sheet. Periodically review your Transplant Handbook for important information regarding postoperative care and required precautions.      Labs and Other Follow-ups after Discharge:   Labs 3x week: CBC, CMP, GGT, Mg, Phos, and Tacrolimus trough level     Liver Post-Transplant Coordinator:  Emilio Math- phone: [phone] fax: [phone]          Labs and Other Follow-ups after Discharge:  Follow Up instructions and Outpatient Referrals     Ambulatory referral to Home Health      Is this a [facility] Patient?: No    Physician to follow patient's care: Other: (please enter in comments)   Comment [facility] for Transplant    Disciplines requested:  Physical Therapy  Occupational Therapy  Nursing       Nursing requested: Other: (please enter in comments) Comment - lab draws    Physical Therapy requested: Evaluate and treat    Occupational Therapy Requested: Evaluate and treat    Discharge instructions          Future Appointments:  Appointments which have been scheduled for you      Feb 18, 2022  9:15 AM  (Arrive by 8:45 AM)  LAB ONLY with SURTRA LAB ONLY  [facility] TRANSPLANT SURGERY [place] [facility] REGION) [address]  [place] [address]  [phone]        Feb 18, 2022 10:30 AM  (Arrive by 10:00 AM)  RETURN PHARMD with TRANSPLANT SURGERY PHARMACY  [facility] TRANSPLANT SURGERY  [facility] REGION) [address]  [place] [address]  [phone]        Feb 18, 2022 12:00 PM  (Arrive by 11:30 AM)  RETURN 15 with Chirag Lanney Gins, MD  [facility] TRANSPLANT SURGERY [place] [facility] REGION) [address]  [place] [address]  [phone]        Feb 25, 2022  9:00 AM  (Arrive by 8:30 AM)  LAB ONLY with SURTRA LAB ONLY  [facility] TRANSPLANT SURGERY [place] [facility] REGION) [address]  [place] [address]  [phone]        Feb 25, 2022 11:30 AM  (Arrive by 11:00 AM)  RETURN PHARMD with TRANSPLANT SURGERY PHARMACY  [facility] TRANSPLANT SURGERY [place] [facility] REGION) [address]  [place] [address]  [phone]        Feb 25, 2022 12:30 PM  (Arrive by 12:00 PM)  RETURN 15 with Chirag Lanney Gins, MD  [facility] TRANSPLANT SURGERY [place] [facility] REGION) [address]  [place] [address]  [phone]        May 06, 2022 10:00 AM  (Arrive by 9:30 AM)  LAB ONLY with LAB PHLEB GRND UNCW  LAB PHLEB GRND [facility] REGION) [address]  [place] [address]  [phone]        May 06, 2022 10:30 AM  (Arrive by 10:00 AM)  RETURN PHARMD with TRANSPLANT SURGERY PHARMACY  [facility] TRANSPLANT SURGERY [place] [facility] REGION) [address]  [place] [address]  [phone]        May 06, 2022 12:00 PM  (Arrive by 11:30 AM)  RETURN 15 with Chirag Lanney Gins, MD  [facility] TRANSPLANT SURGERY [place] [facility] REGION) [address]  [place] [address]  [phone]        May 07, 2022 10:00 AM  (Arrive by 9:30 AM)  RETURN VIDEO HCP MYCHART with Jackolyn Confer, RD/LDN  [facility] NUTRITION SERVICES TRANSPLANT [place] [facility] REGION) [address]  [place] [address]  [phone]   Please sign into My [place] Chart at least 15 minutes before your appointment to complete the eCheck-In process. You must complete eCheck-In before you can start your video visit. We also recommend testing your audio and video connection to troubleshoot any issues before your visit begins. Click ???Join Video Visit??? to complete these checks. Once you have completed eCheck-In and tested your audio and video, click ???Join Call??? to connect to your visit.     For your video visit, you will need a computer with a working camera, speaker and microphone, a smartphone, or a tablet with internet access.    My [place] Chart enables you to manage your health, send non-urgent messages to your provider, view your test results, schedule and manage appointments, and request prescription refills securely and conveniently from your computer or mobile device.    You can go to [email or web address] to sign in to your My [place] Chart account with your username and password. If you have forgotten your username or password, please choose the ???Forgot Username???? and/or ???Forgot Password???? links to gain access. You also can access your My [place] Chart account with the free MyChart mobile app for Android or iPhone.    If you need assistance accessing your My [place] Chart account or for assistance in reaching your provider's  office to reschedule or cancel your appointment, please call [facility] [phone].           May 07, 2022 11:00 AM  (Arrive by 10:30 AM)  RETURN VIDEO HCP MYCHART with Smiley Houseman  [facility] LIVER TRANSPLANT [facility] REGION) [address]  [place] [address]  [phone]   Please sign into My [place] Chart at least 15 minutes before your appointment to complete the eCheck-In process. You must complete eCheck-In before you can start your video visit. We also recommend testing your audio and video connection to troubleshoot any issues before your visit begins. Click ???Join Video Visit??? to complete these checks. Once you have completed eCheck-In and tested your audio and video, click ???Join Call??? to connect to your visit.     For your video visit, you will need a computer with a working camera, speaker and microphone, a smartphone, or a tablet with internet access.    My [place] Chart enables you to manage your health, send non-urgent messages to your provider, view your test results, schedule and manage appointments, and request prescription refills securely and conveniently from your computer or mobile device.    You can go to [email or web address] to sign in to your My [place] Chart account with your username and password. If you have forgotten your username or password, please choose the ???Forgot Username???? and/or ???Forgot Password???? links to gain access. You also can access your My [place] Chart account with the free MyChart mobile app for Android or iPhone.    If you need assistance accessing your My [place] Chart account or for assistance in reaching your provider's office to reschedule or cancel your appointment, please call [facility] [phone].           Jul 17, 2022 11:30 AM  (Arrive by 11:00 AM)  RETURN VIDEO HCP DIRECT LINK with Jake Bathe Diket, PsyD  [facility] TRANSPLANT SURGERY [place]  Arrive at: This is a Video Visit [address]  [place] [address]  [phone]   A direct link will be sent to you by your provider at the time of your video appointment. Please do NOT go to the clinic.     For your video visit, you will need a computer with a working camera, speaker and microphone, a smartphone, or a tablet with internet access.           Jul 30, 2022 10:30 AM  (Arrive by 10:00 AM)  RETURN  HEPATOLOGY with Janyth Pupa, MD  [facility] LIVER TRANSPLANT [place] [facility] REGION) [address]  [place] [address]  [phone]        Jul 31, 2022 11:30 AM  (Arrive by 11:00 AM)  RETURN VIDEO HCP MYCHART with Jackolyn Confer, RD/LDN  [facility] NUTRITION SERVICES TRANSPLANT  [facility] REGION) [address]  [place] [address]  [phone]   Please sign into My [place] Chart at least 15 minutes before your appointment to complete the eCheck-In process. You must complete eCheck-In before you can start your video visit. We also recommend testing your audio and video connection to troubleshoot any issues before your visit begins. Click ???Join Video Visit??? to complete these checks. Once you have completed eCheck-In and tested your audio and video, click ???Join Call??? to connect to your visit.     For your video visit, you will  need a computer with a working Warden/ranger, speaker and microphone, a smartphone, or a tablet with internet access.    My [place] Chart enables you to manage your health, send non-urgent messages to your provider, view your test results, schedule and manage appointments, and request prescription refills securely and conveniently from your computer or mobile device.    You can go to [email or web address] to sign in to your My [place] Chart account with your username and password. If you have forgotten your username or password, please choose the ???Forgot Username???? and/or ???Forgot Password???? links to gain access. You also can access your My [place] Chart account with the free MyChart mobile app for Android or iPhone.    If you need assistance accessing your My [place] Chart account or for assistance in reaching your provider's office to reschedule or cancel your appointment, please call [facility] [phone].           Aug 01, 2022  2:00 PM  (Arrive by 1:30 PM)  RETURN VIDEO HCP MYCHART with TRANSPLANT SURGERY PHARMACY  [facility] TRANSPLANT SURGERY [place] [facility] REGION) [address]  [place] [address]  [phone]   Please sign into My [employer] Chart at least 15 minutes before your appointment to complete the eCheck-In process. You must complete eCheck-In before you can start your video visit. We also recommend testing your audio and video connection to troubleshoot any issues before your visit begins. Click ???Join Video Visit??? to complete these checks. Once you have completed eCheck-In and tested your audio and video, click ???Join Call??? to connect to your visit.     For your video visit, you will need a computer with a working camera, speaker and microphone, a smartphone, or a tablet with internet access.    My [place] Chart enables you to manage your health, send non-urgent messages to your provider, view your test results, schedule and manage appointments, and request prescription refills securely and conveniently from your computer or mobile device.    You can go to [email or web address] to sign in to your My [place] Chart account with your username and password. If you have forgotten your username or password, please choose the ???Forgot Username???? and/or ???Forgot Password???? links to gain access. You also can access your My [place] Chart account with the free MyChart mobile app for Android or iPhone.    If you need assistance accessing your My [place] Chart account or for assistance in reaching your provider's office to reschedule or cancel your appointment, please call [facility] [phone].           Nov 05, 2022 10:30 AM  (Arrive by 10:00 AM)  RETURN  HEPATOLOGY with Janyth Pupa, MD  [facility] LIVER TRANSPLANT [place] [facility] REGION) [address]  [place] [address]  [phone]        Nov 07, 2022  2:00 PM  (Arrive by 1:30 PM)  RETURN VIDEO HCP MYCHART with TRANSPLANT SURGERY PHARMACY  [facility] TRANSPLANT SURGERY [place] [facility] REGION) [address]  [place] [address]  [phone]   Please sign into My [place] Chart at least 15 minutes before your appointment to complete the eCheck-In process. You must complete eCheck-In before you can start your video visit. We also recommend testing your audio and video connection to troubleshoot any issues before your visit begins. Click ???Join Video Visit??? to complete these checks. Once you have completed eCheck-In  and tested your audio and video, click ???Join Call??? to connect to your visit.     For your video visit, you will need a computer with a working camera, speaker and microphone, a smartphone, or a tablet with internet access.    My [place] Chart enables you to manage your health, send non-urgent messages to your provider, view your test results, schedule and manage appointments, and request prescription refills securely and conveniently from your computer or mobile device.    You can go to [email or web address] to sign in to your My [place] Chart account with your username and password. If you have forgotten your username or password, please choose the ???Forgot Username???? and/or ???Forgot Password???? links to gain access. You also can access your My [place] Chart account with the free MyChart mobile app for Android or iPhone.    If you need assistance accessing your My [place] Chart account or for assistance in reaching your provider's office to reschedule or cancel your appointment, please call [facility] [phone].           Feb 04, 2023  8:30 AM  (Arrive by 8:00 AM)  XR DEXA BONE DENSITY SKELETAL with Doran Durand RM 1  IMG DEXA [facility]) [address]  [place] [address]  [phone]   No calcium supplements 24 hrs prior.         Feb 04, 2023 10:00 AM  (Arrive by 9:30 AM)  RETURN  HEPATOLOGY with Janyth Pupa, MD  [facility] LIVER TRANSPLANT [facility] [facility] [place]) [address]  [place] [address]  [phone]        Feb 06, 2023  8:30 AM  (Arrive by 8:00 AM)  RETURN VIDEO HCP MYCHART with TRANSPLANT SURGERY PHARMACY  [facility] TRANSPLANT SURGERY [place] [facility] REGION) [address]  [place] [address]  [phone]   Please sign into My [place] Chart at least 15 minutes before your appointment to complete the eCheck-In process. You must complete eCheck-In before you can start your video visit. We also recommend testing your audio and video connection to troubleshoot any issues before your visit begins. Click ???Join Video Visit??? to complete these checks. Once you have completed eCheck-In and tested your audio and video, click ???Join Call??? to connect to your visit.     For your video visit, you will need a computer with a working camera, speaker and microphone, a smartphone, or a tablet with internet access.    My [place] Chart enables you to manage your health, send non-urgent messages to your provider, view your test results, schedule and manage appointments, and request prescription refills securely and conveniently from your computer or mobile device.    You can go to [email or web address] to sign in to your My [place] Chart account with your username and password. If you have forgotten your username or password, please choose the ???Forgot Username???? and/or ???Forgot Password???? links to gain access. You also can access your My [place] Chart account with the free MyChart mobile app for Android or iPhone.    If you need assistance accessing your My [place] Chart account or for assistance in reaching your provider's office to reschedule or cancel your appointment, please call [facility] [phone].           Feb 06, 2023 10:00 AM  (Arrive by 9:30 AM)  RETURN VIDEO HCP MYCHART with Jackolyn Confer, RD/LDN  [facility]  NUTRITION SERVICES TRANSPLANT [place] [facility] REGION) [address]  [place] [address]  [phone]   Please sign into My [place] Chart at least 15 minutes before your appointment to complete the eCheck-In process. You must complete eCheck-In before you can start your video visit. We also recommend testing your audio and video connection to troubleshoot any issues before your visit begins. Click ???Join Video Visit??? to complete these checks. Once you have completed eCheck-In and tested your audio and video, click ???Join Call??? to connect to your visit.     For your video visit, you will need a computer with a working camera, speaker and microphone, a smartphone, or a tablet with internet access.    My [place] Chart enables you to manage your health, send non-urgent messages to your provider, view your test results, schedule and manage appointments, and request prescription refills securely and conveniently from your computer or mobile device.    You can go to [email or web address] to sign in to your My [place] Chart account with your username and password. If you have forgotten your username or password, please choose the ???Forgot Username???? and/or ???Forgot Password???? links to gain access. You also can access your My [place] Chart account with the free MyChart mobile app for Android or iPhone.    If you need assistance accessing your My [place] Chart account or for assistance in reaching your provider's office to reschedule or cancel your appointment, please call [facility] [phone].           Feb 07, 2023  9:00 AM  (Arrive by 8:30 AM)  RETURN VIDEO HCP MYCHART with Smiley Houseman  [facility] LIVER TRANSPLANT [place] [facility] REGION) [address]  [place] [address]  [phone]   Please sign into My [place] Chart at least 15 minutes before your appointment to complete the eCheck-In process. You must complete eCheck-In before you can start your video visit. We also recommend testing your audio and video connection to troubleshoot any issues before your visit begins. Click ???Join Video Visit??? to complete these checks. Once you have completed eCheck-In and tested your audio and video, click ???Join Call??? to connect to your visit.     For your video visit, you will need a computer with a working camera, speaker and microphone, a smartphone, or a tablet with internet access.    My [place] Chart enables you to manage your health, send non-urgent messages to your provider, view your test results, schedule and manage appointments, and request prescription refills securely and conveniently from your computer or mobile device.    You can go to [email or web address] to sign in to your My [place] Chart account with your username and password. If you have forgotten your username or password, please choose the ???Forgot Username???? and/or ???Forgot Password???? links to gain access. You also can access your My [place] Chart account with the free MyChart mobile app for Android or iPhone.    If you need assistance accessing your My [place] Chart account or for assistance in reaching your provider's office to reschedule or cancel your appointment, please call [facility] [phone].           Feb 07, 2023 11:30 AM  (Arrive by 11:00 AM)  RETURN VIDEO HCP MYCHART with Alvester Chou, PsyD  [facility] TRANSPLANT SURGERY [facility] [place]ION) [address]  [place] [address]  [phone]   Please  sign into My [place] Chart at least 15 minutes before your appointment to complete the eCheck-In process. You must complete eCheck-In before you can start your video visit. We also recommend testing your audio and video connection to troubleshoot any issues before your visit begins. Click ???Join Video Visit??? to complete these checks. Once you have completed eCheck-In and tested your audio and video, click ???Join Call??? to connect to your visit.     For your video visit, you will need a computer with a working camera, speaker and microphone, a smartphone, or a tablet with internet access.    My [place] Chart enables you to manage your health, send non-urgent messages to your provider, view your test results, schedule and manage appointments, and request prescription refills securely and conveniently from your computer or mobile device.    You can go to [email or web address] to sign in to your My [place] Chart account with your username and password. If you have forgotten your username or password, please choose the ???Forgot Username???? and/or ???Forgot Password???? links to gain access. You also can access your My [place] Chart account with the free MyChart mobile app for Android or iPhone.    If you need assistance accessing your My [place] Chart account or for assistance in reaching your provider's office to reschedule or cancel your appointment, please call [facility] [phone].

## 2022-02-15 NOTE — Unmapped (Signed)
Patient afebrile vital signs are stable.  Patient up out of bed ambulating with rollator walker.  Patient tolerating diet.  Blood glucose monitored and insulin given as ordered.  Oxycodone given as requested for pain.  Safety goals reviewed with the patient call bell with in reach.  Dressing changed to surgical incisions , secondary to leaking.  Problem: Adult Inpatient Plan of Care  Goal: Plan of Care Review  Outcome: Progressing  Goal: Patient-Specific Goal (Individualized)  Description: Patient will be able to have bowel movement by day shift on 02/11/2022.   Outcome: Progressing  Goal: Absence of Hospital-Acquired Illness or Injury  Outcome: Progressing  Goal: Optimal Comfort and Wellbeing  Outcome: Progressing  Goal: Readiness for Transition of Care  Outcome: Progressing  Goal: Rounds/Family Conference  Outcome: Progressing     Problem: Infection  Goal: Absence of Infection Signs and Symptoms  Outcome: Progressing     Problem: Impaired Wound Healing  Goal: Optimal Wound Healing  Outcome: Progressing     Problem: Skin Injury Risk Increased  Goal: Skin Health and Integrity  Outcome: Progressing     Problem: Self-Care Deficit  Goal: Improved Ability to Complete Activities of Daily Living  Outcome: Progressing     Problem: Fall Injury Risk  Goal: Absence of Fall and Fall-Related Injury  Outcome: Progressing

## 2022-02-15 NOTE — Unmapped (Signed)
Diabetes education consultation: Consulted for assistance with providing instruction on diabetes self-management skills. Contacted Matthew Key via phone at the bedside to update that insulin plan at discharge has changed.    Assessment: Matthew Keyis a 61 y.o.??male??admitted for OLT . Matthew Key has Diabetes Mellitus Type 2- Insuling requiring  see MAR for medication list. Patient current takes Matthew Key, but will need to be discharged on nutritional and correctional insulin d/t steroid taper.    A1C:    Lab Results   Component Value Date    A1C 5.2 02/05/2022       Insulin administration & safety:     Discussed tentative discharge regimen per Matthew Birk, PA    Matthew Key 30u every day  novolog to 4 units qAC and 2:50>150 achs (scale below)  ??  Correction scale:  If pre-meal or bedtime BG 151-200mg /dL add 2 units, if 161-$WRUEAVWUJWJXBJYN_WGNFAOZHYQMVHQIONGEXBMWUXLKGMWNU$$UVOZDGUYQIHKVQQV_ZDGLOVFIEPPIRJJOACZYSAYTKZSWFUXN$ /dL add 4 units, if BG 235-$TDDUKGURKYHCWCBJ_SEGBTDVVOHYWVPXTGGYIRSWNIOEVOJJK$$KXFGHWEXHBZJIRCV_ELFYBOFBPZWCHENIDPOEUMPNTIRWERXV$ /dL add 6 units, etc.  ??  Explaining long-acting vs rapid acting action, timing of administration & described correctional sliding scale & connection to glucose monitoring. Matthew Key was able to demonstrate an understanding of insulin plan and calculate doses.   Explained that plan may change and to follow discharge instructions.  ??    Thank You,   Matthew Hoff, MSN, RN, Diabetes Nurse Educator- pager: [phone]

## 2022-02-15 NOTE — Unmapped (Signed)
Diabetes education consultation: Consulted for assistance with providing instruction on diabetes self-management skills. Visit with Matthew Key at the bedside. Patient's wife was on the phone during visit. Provided 30 minutes of diabetes education.     Assessment:Matthew Key is a 61 y.o. male admitted for OLT .  Matthew Key has Diabetes Mellitus Type 2- Insuling requiring  see MAR for medication list. Patient current takes Matthew Key, but will need to be discharged on nutritional and correctional insulin d/t steroid taper.       A1C:    Lab Results   Component Value Date    A1C 5.2 02/05/2022       Insulin administration & safety: Discussed tentative discharge regimen per endo recommendation notes:    While on prednisone 5mg :  tresiba 30u every day  Novolog 8 units qAC and 2:50>150 achs (scale below)  ??  ??  While on prednisone 2.5mg :  Tresiba 30u every day  novolog to 4 units qAC and 2:50>150 achs (scale below)  ??  ??  Correction scale:  If pre-meal or bedtime BG 151-200mg /dL add 2 units, if 161-$WRUEAVWUJWJXBJYN_WGNFAOZHYQMVHQIONGEXBMWUXLKGMWNU$$UVOZDGUYQIHKVQQV_ZDGLOVFIEPPIRJJOACZYSAYTKZSWFUXN$ /dL add 4 units, if BG 235-$TDDUKGURKYHCWCBJ_SEGBTDVVOHYWVPXTGGYIRSWNIOEVOJJK$$KXFGHWEXHBZJIRCV_ELFYBOFBPZWCHENIDPOEUMPNTIRWERXV$ /dL add 6 units, etc.  ??  Explaining long-acting vs rapid acting action, timing of administration & described correctional sliding scale & connection to glucose monitoring. Matthew Key was able to demonstrate an understanding of insulin plan and calculate doses.     Provided Tentative Insulin Plan handout and explained that plan may change and to follow discharge instructions.    Reviewed insulin administration with pens and insulin safety - including injection site selection & rotation, insulin pen storage, sharps disposal & discard/expiration date.  Matthew Key  felt confident with ability.        Problem-solving:   Hypoglycemia:    Instructed on hypoglycemia recognition & treatment, including 15-15 rule, listing several examples of appropriate fast carb sources, emphasizing importance of having something readily available. Described possible causes and prevention.  Instructed to contact provider for unexplained episode, an episode requiring more than 1 treatment, or 2 episodes within a 2-3 day period, for possible dose adjustment.   Instructed on Baqsimi use.      Hyperglycemia:  Instructed on hyperglycemia recognition and treatment. Described possible causes and prevention and when to contact provider.     Risk Reduction- Foot Care:  Provided education material Diabetes Foot Health: Care Instructions as a resource.    Insurance: Matthew Key has Medicare HMO: [employer]. There are not financial barriers to glucose monitoring.     Monitoring: Provided Matthew Key with an Accu Check Guide meter kit and instructed on use. Matthew Key reports knowing how to use meter and perform glucose checks. Patient currently uses Freestyle Libre 2 CGM. Discussed verifying low/high alarms with fingerstick, especially if asymptomatic. Instructed to monitor blood sugar 4 times/day ([facility]) and when symptomatic.    Patient verbalized an understanding.     Provided with copies of large print log sheets to maintain a record of readings & instructed to bring to follow-up visits with provider for evaluation of treatment plan.    Monitoring Supplies needing prescriptions at discharge:  Please order Baqsimi at discharge.       Plan: No further teaching needed at this time. Will sign out. Please re-consult if needed.     Thank You,   Matthew Hoff, MSN, RN, Diabetes Nurse Educator- pager: [phone]

## 2022-02-15 NOTE — Unmapped (Signed)
POC reviewed. VSS. Calm and cooperative with care. Incisions leaking a small amount, covered with ABD pad. JP draining bloody drainage. Up to bathroom with wheeled walker and SB assist. Loose Bms. Large bruise present on L flank. No falls/injuries. C/o pain managed with PRN regimen. No further complaints/concerns, will continue with plan of care.   Problem: Adult Inpatient Plan of Care  Goal: Plan of Care Review  Outcome: Progressing  Goal: Patient-Specific Goal (Individualized)  Description: Patient will be able to have bowel movement by day shift on 02/11/2022.   Outcome: Progressing  Goal: Absence of Hospital-Acquired Illness or Injury  Outcome: Progressing  Intervention: Prevent Skin Injury  Recent Flowsheet Documentation  Taken 02/14/2022 2000 by Kae Heller, RN  Skin Protection: adhesive use limited  Intervention: Prevent and Manage VTE (Venous Thromboembolism) Risk  Recent Flowsheet Documentation  Taken 02/14/2022 2000 by Kae Heller, RN  Activity Management:   activity adjusted per tolerance   ambulated in room   ambulated to bathroom  Goal: Optimal Comfort and Wellbeing  Outcome: Progressing  Goal: Readiness for Transition of Care  Outcome: Progressing  Goal: Rounds/Family Conference  Outcome: Progressing     Problem: Infection  Goal: Absence of Infection Signs and Symptoms  Outcome: Progressing  Intervention: Prevent or Manage Infection  Recent Flowsheet Documentation  Taken 02/14/2022 2000 by Kae Heller, RN  Infection Management: aseptic technique maintained  Isolation Precautions: protective precautions maintained     Problem: Impaired Wound Healing  Goal: Optimal Wound Healing  Outcome: Progressing  Intervention: Promote Wound Healing  Recent Flowsheet Documentation  Taken 02/14/2022 2000 by Kae Heller, RN  Activity Management:   activity adjusted per tolerance   ambulated in room   ambulated to bathroom     Problem: Skin Injury Risk Increased  Goal: Skin Health and Integrity  Outcome: Progressing  Intervention: Optimize Skin Protection  Recent Flowsheet Documentation  Taken 02/14/2022 2000 by Kae Heller, RN  Pressure Reduction Techniques: frequent weight shift encouraged  Pressure Reduction Devices: pressure-redistributing mattress utilized  Skin Protection: adhesive use limited     Problem: Self-Care Deficit  Goal: Improved Ability to Complete Activities of Daily Living  Outcome: Progressing     Problem: Fall Injury Risk  Goal: Absence of Fall and Fall-Related Injury  Outcome: Progressing

## 2022-02-15 NOTE — Unmapped (Signed)
Endocrine Team Diabetes Follow Up Consult Note     Consult information:  Requesting Attending Physician : Particia Nearing, MD  Service Requesting Consult : Surg Transplant [facility])  Primary Care Provider: Loman Brooklyn, MD  Impression:  Matthew Key is a 61 y.o. male admitted for OLT. We have been consulted at the request of Particia Nearing, MD to evaluate Matthew Key for hyperglycemia.     Medical Decision Making:  Diagnoses:  1.Type 2 Diabetes. Uncontrolled With hyperglycemia.  2. Nutrition: Complicating glycemic control. Increasing risk for both hypoglycemia and hyperglycemia.  3. Obesity. Complicating glycemic control and increasing risk for hyperglycemia.  4. Transplant. Complicating glycemic control and increasing risk for hyperglycemia.  5. Steroids. Complicating glycemic control and increasing risk for hyperglycemia.  6. History of secondary adrenal insufficiency.  7. Kidney disease. Complicating glycemic control and increasing risk for hypoglycemia.      Studies reviewed 02/15/22:  Labs: CBC, BMP, POCT-BG  Interpretation: Anemia noted. Elevated creatinine with decreased GFR consistent with kidney disease. Glucose trend revealed hyperglycemia, but improving.    Notes reviewed: Primary team and nursing notes      Overall impression based on above reviews and history:  Type 2 diabetes with hyperglycemia. BG in range with the exception of one glucose. Steroids continuing to taper. Continue current regimen while admitted.      Recommendations:  Type 2 Diabetes:  - NPH 16 units q12h  - lispro 10 AC  - lispro target 140, ISF 30 achs  - Hypoglycemia protocol.  - POCT-BG achs.  - Ensure patient is on glucose precautions if patient taking nutrition by mouth.     Discharge planning:  Last dose of prednisone 10mg  was today, 7/6. To take prednisone 5mg  from 7/7-7/12, then 2.5mg  for 15 days. Will discharge on home basal and add nutritional/correctional coverage. In the future would consider restarting ozempic due to CV benefits. As noted below, it was discontinued due to hypotension, however was likely multifactorial. Discharge med rec updated.     Discharge plan: med rec updated 7/6  While on prednisone 5mg :  tresiba 30u every day  Novolog 8 units qAC and 2:50>150 achs (scale below)      While on prednisone 2.5mg :  Tresiba 30u every day  novolog to 4 units qAC and 2:50>150 achs (scale below)      Correction scale:  If pre-meal or bedtime BG 151-200mg /dL add 2 units, if 161-$WRUEAVWUJWJXBJYN_WGNFAOZHYQMVHQIONGEXBMWUXLKGMWNU$$UVOZDGUYQIHKVQQV_ZDGLOVFIEPPIRJJOACZYSAYTKZSWFUXN$ /dL add 4 units, if BG 235-$TDDUKGURKYHCWCBJ_SEGBTDVVOHYWVPXTGGYIRSWNIOEVOJJK$$KXFGHWEXHBZJIRCV_ELFYBOFBPZWCHENIDPOEUMPNTIRWERXV$ /dL add 6 units, etc.          2. History of Secondary Adrenal Insufficiency:  -Normal HPA axis noted in January 2023. Off steroids since. Continue taper as scheduled.    Thank you for this consult. Discussed plan with primary team. We will continue to follow and make recommendations and place orders as appropriate.    Please page with questions or concerns: Hakeen Shipes, NP: [phone]  DCT on call from 6AM - 3PM on weekdays then endocrine fellow on call: [phone] from 3PM - 6AM on weekdays and on weekends and holidays.   If APP cannot be reached, please page the endocrine fellow on call.      Subjective:  Interval history:  The patient reports no acute complaints. PO intake decent.  Denies complaints at time of rounds.      Initial HPI:  Matthew Key is a 61 y.o. 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     Endocrine History: History of secondary adrenal insufficiency. Had been receiving steroids steroids for spinal injections. We saw back in January of this year and noted HPA axis recovery based on ACTH stim test (cortisol went from 8.4-> 22.9). Since then he was prescribed hydrocortisone 10mg  every day prn illness, but states he hasn't taken any.    Diabetes History:  Patient has a history of Type 2 diabetes diagnosed around 2017.  Diabetes is managed by: PCP  Current home diabetes regimen: tresiba 30 units at bedtime. He was on ozempic 1mg  qweek, but was stopped on 6/19 due significant hypotension (DBP in the 30's per pt). He was admitted and it was noted ozempic was held for AKI.   Current home blood glucose monitoring:   1-2 times a day.  Hypoglycemia awareness: yes.  Complications related to diabetes: unknown. Hx of AKI earlier this month. Initially thought to be due to HRS or ATN, but later thought to be pre-renal.       Current Nutrition:  Active Orders   Diet    Nutrition Therapy Regular/House; No Carbonation       ROS: As per HPI.     gabapentin  300 mg Oral Nightly    insulin lispro  0-20 Units Subcutaneous ACHS    insulin lispro  10 Units Subcutaneous 3xd Meals    insulin NPH  16 Units Subcutaneous Q12H    melatonin  3 mg Oral QPM    methocarbamoL  500 mg Oral QID    mycophenolate  1,000 mg Oral BID    nystatin  10 mL Oral Q8H SCH    pantoprazole  40 mg Oral Daily    predniSONE  5 mg Oral Daily    Followed by    Melene Muller ON 02/21/2022] predniSONE  2.5 mg Oral Daily    sulfamethoxazole-trimethoprim  1 tablet Oral Once per day on Mon Wed Fri    tacrolimus  9 mg Oral BID    tamsulosin  0.4 mg Oral Nightly    valGANciclovir  450 mg Oral Daily       Current Outpatient Medications   Medication Instructions    acetaminophen (TYLENOL) 325-650 mg, Oral, Every 6 hours PRN    aspirin (ECOTRIN) 81 mg, Oral, Daily (standard)    atorvastatin (LIPITOR) 10 mg, Oral, Nightly    blood sugar diagnostic (ACCU-CHEK GUIDE TEST STRIPS) Strp Use to check blood sugar as directed with insulin 3 times a day & for symptoms of high or low blood sugar    blood-glucose meter Misc Use to test blood sugar as directed    ciprofloxacin HCl (CIPRO) 500 MG tablet TAKE 1 TABLET BY MOUTH IN THE MORNING    docusate sodium (COLACE) 100 mg, Oral, 2 times a day PRN    furosemide (LASIX) 40 mg, Oral, Daily (standard), Restart on 6/23    gabapentin (NEURONTIN) 300 mg, Oral, At bedtime    hydrOXYzine (VISTARIL) 50 mg, Oral, Nightly PRN    insulin ASPART (NOVOLOG FLEXPEN) 100 unit/mL (3 mL) injection pen Take 4 units subcutaneous with meals as directed along with correction if needed. If premeal BG 151-200 take 2 additional units for correction, if 201-250 take 4 additional units, etc.    insulin syringe-needle U-100 1 mL 31 gauge x 5/16 (8 mm) Syrg Use once as needed for Hydrocortisone injection.Marland Kitchen    lactulose 20 g, Oral, 3 times a day (standard)    lancets Misc Use  to check blood sugar as directed with insulin 3 times a day & for symptoms of high or low blood sugar.    magnesium oxide-Mg AA chelate (MAGNESIUM, AMINO ACID CHELATE,) 133 mg 1 tablet, Oral, 2 times a day, Hold until directed to take by nurse coordinator    melatonin 3 mg, Oral, Every evening    methocarbamoL (ROBAXIN) 500 mg, Oral, 3 times a day PRN    mycophenolate (CELLCEPT) 1,000 mg, Oral, 2 times a day (standard)    ondansetron (ZOFRAN-ODT) 4 mg, Oral, Every 8 hours PRN    oxyCODONE (ROXICODONE) 5-10 mg, Oral, Every 6 hours PRN    pantoprazole (PROTONIX) 40 mg, Oral, Daily (standard)    pen needle, diabetic (BD ULTRA-FINE NANO PEN NEEDLE) 32 gauge x 5/32 (4 mm) Ndle Use as directed  1-2 times per day    [START ON 02/16/2022] predniSONE (DELTASONE) 2.5 MG tablet Take 2 tablets (5 mg total) by mouth daily for 5 days, THEN 1 tablet (2.5 mg total) daily for 16 days.    sodium chloride (AYR) 0.65 % Drop 1 spray, Every 4 hours PRN    spironolactone (ALDACTONE) 50 mg, Oral, Daily (standard), Restart 6/23    sulfamethoxazole-trimethoprim (BACTRIM) 400-80 mg per tablet 80 mg of trimethoprim, Oral, 3 times weekly    syringe with needle 3 mL 23 x 1 Syrg Use as needed for Hydrocortisone injection    tacrolimus (PROGRAF) 9 mg, Oral, 2 times a day    tamsulosin (FLOMAX) 0.4 mg, Oral, Nightly    TRESIBA FLEXTOUCH U-100 30 Units, Subcutaneous, Daily (standard), Adjust as instructed.    valGANciclovir (VALCYTE) 450 mg, Oral, Daily (standard)    zinc sulfate 110 mg, Oral, Daily           Past Medical History:   Diagnosis Date    AKI (acute kidney injury) (CMS-HCC) 12/14/2020    Anxiety 10/22/2013    Arthritis     Cervical radiculopathy 12/03/2016    Chronic pain disorder     Lower back    Cirrhosis (CMS-HCC)     Dental abscess 10/2020    Duodenal ulcer 12/01/2017    GERD (gastroesophageal reflux disease)     History of transfusion     Hyperlipidemia 10/22/2013    Hypertension     under control with meds and weight loss    Hypotension 01/29/2022    Liver disease     Sleep apnea, obstructive     Have machine    Stroke (CMS-HCC)     mild stroke    Type 2 diabetes mellitus with diabetic neuropathy, with long-term current use of insulin (CMS-HCC) 06/09/2014       Past Surgical History:   Procedure Laterality Date    KNEE SURGERY      PR CATH PLACE/CORON ANGIO, IMG SUPER/INTERP,R&L HRT CATH, L HRT VENTRIC N/A 12/19/2020    Procedure: CATH LEFT/RIGHT HEART CATHETERIZATION;  Surgeon: Rosana Hoes, MD;  Location: [facility] CATH;  Service: Cardiology    PR COLONOSCOPY FLX DX W/COLLJ SPEC WHEN PFRMD N/A 12/07/2021    Procedure: COLONOSCOPY, FLEXIBLE, PROXIMAL TO SPLENIC FLEXURE; DIAGNOSTIC, W/WO COLLECTION SPECIMEN BY BRUSH OR WASH;  Surgeon: Annie Paras, MD;  Location: [facility];  Service: Gastroenterology    PR TRANSPLANT LIVER,ALLOTRANSPLANT N/A 02/06/2022    Procedure: LIVER ALLOTRANSPLANTATION; ORTHOTOPIC, PARTIAL OR WHOLE, FROM CADAVER OR LIVING DONOR, ANY AGE;  Surgeon: Florene Glen, MD;  Location: MAIN OR [place];  Service: Transplant    PR TRANSPLANT,PREP  DONOR LIVER/ARTERIAL N/A 02/06/2022    Procedure: BACKBNCH RECONSTRUCT OF CAD/LIVE DONOR LIVER GFT PRIOR TRANSPLANT; ARTERIAL ANASTAMOSIS, EA;  Surgeon: Florene Glen, MD;  Location: MAIN OR [place];  Service: Transplant    PR UPPER GI ENDOSCOPY,BIOPSY N/A 10/12/2020    Procedure: UGI ENDOSCOPY; WITH BIOPSY, SINGLE OR MULTIPLE;  Surgeon: Marene Lenz, MD;  Location: [facility];  Service: Gastroenterology    PR UPPER GI ENDOSCOPY,DIAGNOSIS N/A 01/03/2022    Procedure: UGI ENDO, INCLUDE ESOPHAGUS, STOMACH, & DUODENUM &/OR JEJUNUM; DX W/WO COLLECTION SPECIMN, BY BRUSH OR WASH;  Surgeon: Marene Lenz, MD;  [facility]: [facility];  Service: Gastroenterology    PR UPPER GI ENDOSCOPY,LIGAT VARIX N/A 12/07/2021    Procedure: UGI ENDO; Everlene Balls LIG ESOPH &/OR GASTRIC VARICES;  Surgeon: Annie Paras, MD;  [facility]: [facility];  Service: Gastroenterology    ROOT CANAL      Front teeth       Family History   Problem Relation Age of Onset    Edema Mother     Alzheimer's disease Father     Aortic dissection Brother     Early death Brother     Aneurysm Brother        Social History     Tobacco Use    Smoking status: Never    Smokeless tobacco: Never   Vaping Use    Vaping Use: Never used   Substance Use Topics    Alcohol use: Not Currently    Drug use: Not Currently       OBJECTIVE:  BP 130/62  - Pulse 80  - Temp 36.8 ??C (98.2 ??F) (Oral)  - Resp 16  - Ht 172.7 cm (5' 7.99)  - Wt 94.8 kg (209 lb)  - SpO2 94%  - BMI 31.79 kg/m??   Wt Readings from Last 12 Encounters:   02/11/22 94.8 kg (209 lb)   01/29/22 96.9 kg (213 lb 11.2 oz)   01/14/22 89.8 kg (198 lb)   01/03/22 88.9 kg (196 lb)   12/31/21 90.4 kg (199 lb 6.4 oz)   12/11/21 90 kg (198 lb 7.1 oz)   12/07/21 89.8 kg (198 lb)   11/26/21 92.1 kg (203 lb)   10/23/21 100.7 kg (222 lb 0.1 oz)   10/08/21 90 kg (198 lb 8 oz)   10/01/21 92.4 kg (203 lb 12.8 oz)   09/10/21 94.3 kg (208 lb)     Physical Exam  Constitutional:       Appearance: Normal appearance.   Pulmonary:      Effort: Pulmonary effort is normal. No respiratory distress.   Neurological:      Mental Status: He is alert.   Psychiatric:         Mood and Affect: Mood normal.         Behavior: Behavior normal.           BG/insulin reviewed per EMR.   Glucose, POC (mg/dL)   Date Value   16/05/9603 114   02/14/2022 223 (H)   02/14/2022 155   02/14/2022 177   02/13/2022 219 (H)   02/13/2022 226 (H)   02/13/2022 196 (H)   02/13/2022 136        Summary of labs:  Lab Results   Component Value Date    A1C 5.2 02/05/2022    A1C 4.7 (L) 01/14/2022    A1C 4.6 (L) 12/11/2021     Lab Results   Component Value  Date    CREATININE 1.77 (H) 02/14/2022     Lab Results   Component Value Date    WBC 6.8 02/15/2022    HGB 9.2 (L) 02/15/2022    HCT 25.9 (L) 02/15/2022    PLT 148 (L) 02/15/2022       Lab Results   Component Value Date    NA 135 02/14/2022    K 4.3 02/14/2022    CL 100 02/14/2022    CO2 30.0 02/14/2022    BUN 37 (H) 02/14/2022    CREATININE 1.77 (H) 02/14/2022    GLU 177 02/14/2022    CALCIUM 8.7 02/14/2022    MG 2.2 02/14/2022    PHOS 4.1 02/14/2022       Lab Results   Component Value Date    BILITOT 0.8 02/14/2022    BILIDIR 0.40 (H) 02/14/2022    PROT 5.5 (L) 02/14/2022    ALBUMIN 3.3 (L) 02/14/2022    ALT 117 (H) 02/14/2022    AST 30 02/14/2022    ALKPHOS 112 02/14/2022    GGT 217 (H) 02/14/2022

## 2022-02-15 NOTE — Unmapped (Addendum)
Addendum-received update from PA, JP drain being removed prior to discharge home.  Matthew Key Inpatient Transplant Nurse Coordinator 02/15/2022 1:10 PM    Reviewed with patient, son on phone upcoming labs-home health requested and patient has LC orders in event [facility] nurse does not show; reviewed upcoming appts-pt understands he will need a ride to hospital. Calendar provided to patient with dates. Nurse to teach JP drain care-empty twice daily.  Matthew Key, CM arranging transport for 4pm today.    Education Follow up Assessment (Patient may utilize resources: family, booklet, med list.)   Re-educate on all incorrect responses and reassess those at conclusion of session.       1) Who do you call on nights, weekends, and holidays if you are having a transplant issue? Reviewed with patient on call coordinator     2) Name 2 reasons to call the on-call coordinator. Reviewed for temp, pain, n, v, d    3) Have you spoken with a pharmacist?  yes    4) When do you take your morning medicines on lab draw days? after     5) Can you name one your anti-rejection medicines? Not reviewed    6) Can you name one your anti-infectious medicines? Not reviewed    7) What are you responsible for monitoring when you are at home? Taking meds, BP, temp     8) How do you prevent rejection? reviewed to get labs drawn, take meds on time and call if not feeling well per book    9) Have you spoken with a dietician about safe food handling?  yes     10) Do you have any questions about safe food handling?  no     11) With the information we have shared with you and the resources that you have access to, do you feel comfortable going home?  yes    Matthew Key Inpatient Abdominal Transplant Nurse Coordinator 02/15/2022 11:19 AM

## 2022-02-16 MED ORDER — PREDNISONE 2.5 MG TABLET
ORAL_TABLET | ORAL | 0 refills | 21.00000 days | Status: CP
Start: 2022-02-16 — End: 2022-03-09
  Filled 2022-02-15: qty 26, 21d supply, fill #0

## 2022-02-16 NOTE — Unmapped (Signed)
Called and spoke with pt's wife who mentioned patient came home and was trying to fill his pillbox and got tired and frustrated.   She mentioned the [facility] nurse, Darlene, was able to help him today.   While [facility] nurse there today, he was eating and started to be nauseated and Darlene mentioned for him to stretch/arch his back some and after this, he felt better thinking it was gas pain.   Ate dinner last night and some breakfast and Ensure today; currently taking a nap and about to get him up so he can eat lunch.   Taking pain medication for now and discussed he can take tylenol in-between for pain control; just not to exceed 3000mg  tylenol in 24 hour period.  Discussed oxycodone can cause constipation and the need to use miralax/stool softener. Had normal BM last night. She was going to give him miralax today and once a day. Verbalized understanding that if he starts having loose stool that he needs to stop the miralax/stool softeners.   Discussed since he has an appt on Monday for him to bring his medication card and log sheets. She mentioned forgetting to write down the BP and blood glucose this morning while HH nurse was there.   Pt's wife mentioned he is shaky and wondered about this. Discussed that a side effect of tacrolimus can be tremors and for some as they get used to the medication that this improves; also mentioned this is an adjustment time with tac dosage and sometimes tremors can occur when the level is higher than goal and needs adjusting.   Mentioned tacrolimus can also cause headaches. She mentioned he had a slight headache this morning but thinks it was related more to his blood sugar/not eating because it was better once he ate this morning.   Tomorrow his sister is coming over and a neighbor; the neighbor and him are going to watch racing.

## 2022-02-16 NOTE — Unmapped (Signed)
Received page from [facility] nurse, Agustin Cree, [phone] saying he has his medication sheet but they were unsure if they were to resume other home medications.     Mentioned the discharge summary is specific with medications to stop and read through them with Darlene who had this coordinator on speaker phone.   Mentioned the main thing is to follow the medication sheet that was given as these are the medications to follow.     Discussed that this coordinator will call them later on to check on them since he was just discharged and since Agustin Cree is there working with them.

## 2022-02-18 ENCOUNTER — Ambulatory Visit: Admit: 2022-02-18 | Discharge: 2022-02-18 | Payer: MEDICARE

## 2022-02-18 ENCOUNTER — Institutional Professional Consult (permissible substitution): Admit: 2022-02-18 | Discharge: 2022-02-18 | Payer: MEDICARE

## 2022-02-18 DIAGNOSIS — I8500 Esophageal varices without bleeding: Principal | ICD-10-CM

## 2022-02-18 DIAGNOSIS — Z944 Liver transplant status: Principal | ICD-10-CM

## 2022-02-18 DIAGNOSIS — Z796 Long-term use of immunosuppressant medication: Principal | ICD-10-CM

## 2022-02-18 DIAGNOSIS — K269 Duodenal ulcer, unspecified as acute or chronic, without hemorrhage or perforation: Principal | ICD-10-CM

## 2022-02-18 LAB — COMPREHENSIVE METABOLIC PANEL
ALBUMIN: 3.2 g/dL — ABNORMAL LOW (ref 3.4–5.0)
ALKALINE PHOSPHATASE: 114 U/L (ref 46–116)
ALT (SGPT): 60 U/L — ABNORMAL HIGH (ref 10–49)
ANION GAP: 7 mmol/L (ref 5–14)
AST (SGOT): 18 U/L (ref <=34)
BILIRUBIN TOTAL: 0.9 mg/dL (ref 0.3–1.2)
BLOOD UREA NITROGEN: 37 mg/dL — ABNORMAL HIGH (ref 9–23)
BUN / CREAT RATIO: 20
CALCIUM: 8.6 mg/dL — ABNORMAL LOW (ref 8.7–10.4)
CHLORIDE: 105 mmol/L (ref 98–107)
CO2: 29 mmol/L (ref 20.0–31.0)
CREATININE: 1.87 mg/dL — ABNORMAL HIGH
EGFR CKD-EPI (2021) MALE: 40 mL/min/{1.73_m2} — ABNORMAL LOW (ref >=60)
GLUCOSE RANDOM: 119 mg/dL (ref 70–179)
POTASSIUM: 5.2 mmol/L — ABNORMAL HIGH (ref 3.4–4.8)
PROTEIN TOTAL: 5.5 g/dL — ABNORMAL LOW (ref 5.7–8.2)
SODIUM: 141 mmol/L (ref 135–145)

## 2022-02-18 LAB — CBC W/ AUTO DIFF
BASOPHILS ABSOLUTE COUNT: 0.1 10*9/L (ref 0.0–0.1)
BASOPHILS RELATIVE PERCENT: 0.8 %
EOSINOPHILS ABSOLUTE COUNT: 0.1 10*9/L (ref 0.0–0.5)
EOSINOPHILS RELATIVE PERCENT: 0.9 %
HEMATOCRIT: 26.2 % — ABNORMAL LOW (ref 39.0–48.0)
HEMOGLOBIN: 8.9 g/dL — ABNORMAL LOW (ref 12.9–16.5)
LYMPHOCYTES ABSOLUTE COUNT: 1 10*9/L — ABNORMAL LOW (ref 1.1–3.6)
LYMPHOCYTES RELATIVE PERCENT: 8.9 %
MEAN CORPUSCULAR HEMOGLOBIN CONC: 33.8 g/dL (ref 32.0–36.0)
MEAN CORPUSCULAR HEMOGLOBIN: 31.9 pg (ref 25.9–32.4)
MEAN CORPUSCULAR VOLUME: 94.3 fL (ref 77.6–95.7)
MEAN PLATELET VOLUME: 9.2 fL (ref 6.8–10.7)
MONOCYTES ABSOLUTE COUNT: 0.7 10*9/L (ref 0.3–0.8)
MONOCYTES RELATIVE PERCENT: 6.1 %
NEUTROPHILS ABSOLUTE COUNT: 9.8 10*9/L — ABNORMAL HIGH (ref 1.8–7.8)
NEUTROPHILS RELATIVE PERCENT: 83.3 %
PLATELET COUNT: 253 10*9/L (ref 150–450)
RED BLOOD CELL COUNT: 2.78 10*12/L — ABNORMAL LOW (ref 4.26–5.60)
RED CELL DISTRIBUTION WIDTH: 18.7 % — ABNORMAL HIGH (ref 12.2–15.2)
WBC ADJUSTED: 11.8 10*9/L — ABNORMAL HIGH (ref 3.6–11.2)

## 2022-02-18 LAB — GAMMA GT: GAMMA GLUTAMYL TRANSFERASE: 154 U/L — ABNORMAL HIGH

## 2022-02-18 LAB — TACROLIMUS LEVEL: TACROLIMUS BLOOD: 8.6 ng/mL

## 2022-02-18 LAB — PHOSPHORUS: PHOSPHORUS: 5.7 mg/dL — ABNORMAL HIGH (ref 2.4–5.1)

## 2022-02-18 LAB — MAGNESIUM: MAGNESIUM: 1.7 mg/dL (ref 1.6–2.6)

## 2022-02-18 LAB — BILIRUBIN, DIRECT: BILIRUBIN DIRECT: 0.5 mg/dL — ABNORMAL HIGH (ref 0.00–0.30)

## 2022-02-18 MED ORDER — PANTOPRAZOLE 40 MG TABLET,DELAYED RELEASE
ORAL_TABLET | Freq: Every day | ORAL | 11 refills | 30 days | Status: CP
Start: 2022-02-18 — End: ?

## 2022-02-18 MED ORDER — ACETAMINOPHEN 325 MG TABLET
ORAL_TABLET | Freq: Four times a day (QID) | ORAL | 11 refills | 13 days | Status: CP | PRN
Start: 2022-02-18 — End: ?

## 2022-02-18 MED ORDER — TAMSULOSIN 0.4 MG CAPSULE
ORAL_CAPSULE | Freq: Every evening | ORAL | 3 refills | 30 days | Status: CP
Start: 2022-02-18 — End: 2023-02-18

## 2022-02-18 MED ORDER — GABAPENTIN 300 MG CAPSULE
ORAL_CAPSULE | Freq: Every evening | ORAL | 3 refills | 90 days | Status: CP
Start: 2022-02-18 — End: ?

## 2022-02-18 MED ORDER — ASPIRIN 81 MG TABLET,DELAYED RELEASE
ORAL_TABLET | Freq: Every day | ORAL | 3 refills | 90 days | Status: CP
Start: 2022-02-18 — End: ?

## 2022-02-18 MED ORDER — MYCOPHENOLATE MOFETIL 250 MG CAPSULE
ORAL_CAPSULE | Freq: Two times a day (BID) | ORAL | 11 refills | 30 days | Status: CP
Start: 2022-02-18 — End: 2023-02-18

## 2022-02-18 MED ORDER — DOCUSATE SODIUM 100 MG CAPSULE
ORAL_CAPSULE | Freq: Two times a day (BID) | ORAL | 11 refills | 30 days | Status: CP | PRN
Start: 2022-02-18 — End: ?
  Filled 2022-03-29: qty 60, 30d supply, fill #0

## 2022-02-18 MED ORDER — HYDROXYZINE PAMOATE 50 MG CAPSULE
ORAL_CAPSULE | Freq: Every evening | ORAL | 3 refills | 30 days | Status: CP | PRN
Start: 2022-02-18 — End: ?

## 2022-02-18 MED ORDER — SULFAMETHOXAZOLE 400 MG-TRIMETHOPRIM 80 MG TABLET
ORAL_TABLET | ORAL | 4 refills | 28 days | Status: CP
Start: 2022-02-18 — End: ?

## 2022-02-18 MED ORDER — TACROLIMUS 1 MG CAPSULE, IMMEDIATE-RELEASE
ORAL_CAPSULE | Freq: Two times a day (BID) | ORAL | 11 refills | 30 days | Status: CP
Start: 2022-02-18 — End: 2023-02-18

## 2022-02-18 MED ORDER — INSULIN ASPART (U-100) 100 UNIT/ML (3 ML) SUBCUTANEOUS PEN
6 refills | 0 days | Status: CP
Start: 2022-02-18 — End: ?

## 2022-02-18 MED ORDER — INSULIN DEGLUDEC (U-100) 100 UNIT/ML (3 ML) SUBCUTANEOUS PEN
Freq: Every day | SUBCUTANEOUS | 11 refills | 57 days | Status: CP
Start: 2022-02-18 — End: ?

## 2022-02-18 NOTE — Unmapped (Signed)
[facility] CLINIC PHARMACY NOTE  Matthew Key  [id number]    Medication changes today:   1. Decrease Tresiba to 26 units HS    Education/Adherence tools provided today:  - Provided updated medication list  - Provided additional education on immunosuppression and transplant related medications including reviewing indications of medications, dosing and side effects  - Discussed adherence reminder tools such as cell phone alarms  - Provided additional pill box education  - Provided assistance with pill box fill    Follow up items:  1. Goal of understanding indications and dosing of immunosuppression medications  2. Consider stopping tamsulosin at future visit  3. Consider adding back statin at future visit  4. BG    Next visit with pharmacy in 1-2 weeks  ____________________________________________________________________    Matthew Key is a 61 y.o. 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     Immunologic Risk: first transplant    Induction Agent : basiliximab    Donor Factors: DBD    Other PMH significant for Hypertension and Stroke, T2DM, OSA on CPAP    Post op course uncomplicated    Rejection History: NTD  Infection History: NTD  ___________________________________________________________________    First visit with transplant pharmacy    Interval History: discharged from index hospitalization    Seen by pharmacy today for: medication management and pill box fill and adherence education    CC:  Patient complains of  itching with staples and involuntary leg twitching at night when trying to fall asleep    General: Fatigue  Neuro:  involuntary leg twitching HS  CV: Chest Pain with exertion  Resp: Shortness of breath with exertion  GI: Nausea without vomiting  GU: No issues  Derm: No issues  Psych: Insomnia    Fluid Status:   Edema yes (abdomen) SOB yes  Intake: not tracking but at least 48 oz per day (water and gatorade)   Output: not tracking   Plan: Instructed pt to avoid Gatorade.  Will not add diuretic today and plan to reassess at next week visit.      There were no vitals filed for this visit.  ___________________________________________________________________    Allergies   Allergen Reactions    Venom-Honey Bee Swelling       Medications reviewed in EPIC medication station and updated today by the clinical pharmacist practitioner.    Current Outpatient Medications   Medication Instructions    acetaminophen (TYLENOL) 325-650 mg, Oral, Every 6 hours PRN    aspirin (ECOTRIN) 81 mg, Oral, Daily (standard)    blood sugar diagnostic (ACCU-CHEK GUIDE TEST STRIPS) Strp Use to check blood sugar as directed with insulin 3 times a day & for symptoms of high or low blood sugar    blood-glucose meter Misc Use to test blood sugar as directed    docusate sodium (COLACE) 100 mg, Oral, 2 times a day PRN    gabapentin (NEURONTIN) 300 mg, Oral, At bedtime    glucagon 3 mg/actuation Spry Use 1 spray into the left nostril every fifteen (15) minutes as needed (low blood sugar).    hydrOXYzine (VISTARIL) 50 mg, Oral, Nightly PRN    insulin ASPART (NOVOLOG FLEXPEN) 100 unit/mL (3 mL) injection pen Take 4 units subcutaneous with meals as directed along with correction if needed. If premeal BG 151-200 take 2 additional units for correction, if 201-250 take 4 additional units, etc.    lancets Misc Use  to check blood sugar as directed with insulin 3 times a day & for symptoms of high or low blood sugar.    magnesium oxide-Mg AA chelate (MAGNESIUM, AMINO ACID CHELATE,) 133 mg 1 tablet, Oral, 2 times a day, Hold until directed to take by nurse coordinator    melatonin 3 mg, Oral, Every evening    methocarbamoL (ROBAXIN) 500 mg, Oral, 3 times a day PRN    mycophenolate (CELLCEPT) 1,000 mg, Oral, 2 times a day (standard)    oxyCODONE (ROXICODONE) 5-10 mg, Oral, Every 6 hours PRN    pantoprazole (PROTONIX) 40 mg, Oral, Daily (standard)    pen needle, diabetic (BD ULTRA-FINE NANO PEN NEEDLE) 32 gauge x 5/32 (4 mm) Ndle Use as directed  1-2 times per day    predniSONE (DELTASONE) 2.5 MG tablet Take 2 tablets (5 mg total) by mouth daily for 5 days, THEN 1 tablet (2.5 mg total) daily for 16 days.    sulfamethoxazole-trimethoprim (BACTRIM) 400-80 mg per tablet 80 mg of trimethoprim, Oral, 3 times weekly    tacrolimus (PROGRAF) 9 mg, Oral, 2 times a day    tamsulosin (FLOMAX) 0.4 mg, Oral, Nightly    TRESIBA FLEXTOUCH U-100 30 Units, Subcutaneous, Daily (standard), Adjust as instructed.    valGANciclovir (VALCYTE) 450 mg, Oral, Daily (standard)         GRAFT FUNCTION:  LFTs slightly elevated but stable    Lab Results   Component Value Date    AST 44 (H) 02/15/2022    AST 34 02/04/2022    ALT 120 (H) 02/15/2022    ALT 24 02/04/2022    Total Bilirubin 0.8 02/15/2022    Total Bilirubin 2.6 (H) 02/04/2022        Zero hour biopsy: path pending  Biopsies to date: NTD      Renal Function: improving but above pre txp baseline of 0.9    Lab Results   Component Value Date    Creatinine 1.67 (H) 02/15/2022    Creatinine 1.77 (H) 02/14/2022    Creatinine 1.62 (H) 02/13/2022    Creatinine 1.57 (H) 02/13/2022    Creatinine 1.45 (H) 02/04/2022    Creatinine 1.71 (H) 01/21/2022    Creatinine 1.43 (H) 01/08/2022    Creatinine 1.35 (H) 12/24/2021       Proteinuria/UPC: No/ none.  No results found for: PCRATIOUR        CURRENT IMMUNOSUPPRESSION:    Tacrolimus (Prograf) 9 mg BID  Tacrolimus Goal: 8 - 10   Mycophenolate mofetil (Cellcept) 10000 mg BID    Prednisone 5 mg daily thru 7/12 then 2.5 mg daily thru 7/28 (if path doesn't show NASH may continue 5 mg daily for now)    IMMUNOSUPPRESSION DRUG LEVELS:  Lab Results   Component Value Date    Tacrolimus, Trough 6.4 02/15/2022    Tacrolimus, Trough 5.7 02/14/2022    Tacrolimus, Trough 4.4 (L) 02/13/2022     No results found for: CYCLO  No results found for: EVEROLIMUS  No results found for: SIROLIMUS    Prograf level is accurate 12 hour trough    Patient  c/o leg twitching when trying to fall asleep at night    WBC/ANC:  wnl  Lab Results   Component Value Date    WBC 6.8 02/15/2022    WBC 3.7 11/21/2021       Plan: Will maintain current immunosuppression.  If twitching doesn't improve consider Envarsus. Continue to monitor.      OI Prophylaxis:  CMV Status: D+/ R+, moderate risk .   CMV prophylaxis: valganciclovir 450 mg daily x 3 months per protocol (end 05/09/22)  No results found for: CMVCP    PCP Prophylaxis: bactrim SS 1 tab MWF x 6 months (end 08/08/22)    Thrush: completed in hospital    Patient is  tolerating infectious prophylaxis well    Plan: Continue per protocol. Continue to monitor.      CVA history: asa 81 mg   The 10-year ASCVD risk score (Arnett DK, et al., 2019) is: 3.9%  No results found for: LIPID    Statin therapy: Indicated; currently on no statin (was on atorvastatin 10 mg daily pre txp that was held post op 2/2 elevated LFTs  Plan:  at future visit consider adding back atorvastatin at high intensity given stroke history. Continue to monitor       BP: Goal < 140/90. Clinic vitals reported above  Home BP ranges:   Date AM BP PM BP   7/9 136/67 121/45     Current meds include: none  Plan: within goal. Continue to monitor    Anemia of CKD:  H/H:   Lab Results   Component Value Date    HGB 9.2 (L) 02/15/2022     Lab Results   Component Value Date    HCT 25.9 (L) 02/15/2022     Iron panel:  Lab Results   Component Value Date    IRON 98 05/09/2021    TIBC 170 (L) 05/09/2021    FERRITIN 558.5 (H) 05/09/2021     Lab Results   Component Value Date    Iron Saturation (%) 58 (H) 05/09/2021       Prior ESA use: none post transplnat      Plan: stable. No change.  Continue to monitor.     DM:   Lab Results   Component Value Date    A1C 5.2 02/05/2022   . Goal A1c < 7  History of Dm? Yes: T2DM  Established with endocrinologist/PCP for BG managment? Yes: Dr. Melrose Nakayama with [facility]  Currently on: Tresiba 30 units daily, Novolog 4 units TID AC + SSI  (was on Ozempic 1 mg weekly until stopped 6/19 for hypotension)  Home BS log:   Breakfast Lunch  Dinner  HS   Date AC PC AC PC [facility] PC    7/9 78  267  178     7/10 50           Diet: small portions for breakfast/lunch and normal portion for dinner.  Also drinking Ensure  Exercise:not yet  Hypoglycemia: yes  Plan: Decrease Tresiba to 26 units HS.  At future visit may consider restarting Ozempic to decrease insulin requirement.      Electrolytes: wnl  Lab Results   Component Value Date    Potassium 4.5 02/15/2022    Potassium, Bld 4.2 02/06/2022    Sodium 139 02/15/2022    Sodium Whole Blood 141 02/06/2022    Magnesium 2.0 02/15/2022    CO2 30.0 02/15/2022    CO2 24 02/04/2022       Meds currently on: none  Plan: Instructed to stop drinking Gatorade. Continue to monitor     GI/BM: pt reports  loose stools  Meds currently on: docusate PRN (BID), Miralax PRN (not using), pantoprazole 40 mg daily  Plan: Instructed pt to only use docusate if constipated Continue to monitor    Pain: pt reports moderate pain  Meds currently on: APAP  PRN (using 325 one to two times per day), oxycodone 5-10 mg q6h PRN (used two yesterday), gabapentin 300 mg HS, methocarbamol 500 mg TID PRN  Plan: Start APAP 975 mg BID. Continue to monitor    Urinary retention: started post top after failed trial of void  Meds currently on: tamsulosin 0.4 mg daily  Plan: Consider stopping at next/future visit if no urinary symptoms    Bone health:   Vitamin D Level: none available. Goal > 30.   Lab Results   Component Value Date    Vitamin D Total (25OH) 27.9 09/11/2020       Lab Results   Component Value Date    Calcium 8.5 (L) 02/15/2022    Calcium 8.7 02/14/2022    Calcium 8.6 02/04/2022    Calcium 9.3 01/21/2022       Last DEXA results:  01/2020: osteopenia (spine T score -1.1, femoral neck -1.7)  Current meds include:   Plan: Vitamin D level  needs to be drawn with next lab schedule.   Consider adding Vitamin D and calcium given osteopenia if D is low.Continue to monitor.     Anxiety:  Meds currently on: hydroxyzine 50 HS PRN (hasn't used since DC)    Women's/Men's Health:  Matthew Key is a 61 y.o. male. Patient reports no men's/women's health issues  Plan: Continue to monitor    Immunizations:  Influenza [Annual]: Received 06/2021    PCV13: Received 11/2017  PPSV23: Received 03/2020  PCV20: Received 08/2021    Shingrix Zoster [2 doses, 2 - 6 months apart]: Received 05/2020, 07/2020    COVID-19 [3 primary doses, 2 boosters]: 1st dose given 10/2019, 2nd dose given 10/2019, 3rd dose given 05/2020, and Booster given 08/2021 (bivalent)    Immunization status: up to date and documented.    Pharmacy preference:  [facility]  Medication Refills:  N/a  Medication Access:  N/a    Adherence: Patient has poor understanding of medications; was not able to independently identify names/doses of immunosuppressants and OI meds.  Patient  does fill their own pill box on a regular basis at home.  Patient brought medication card:yes  Pill box:was correct (filled from inpt admission)  Plan: Supervised pill box fill for pt.  Asked that he bring filled box for pharmacist review to next visit; provided extensive adherence counseling/intervention    Patient was reviewed with Dr.Desai who was agreement with the stated plan:     During this visit, the following was completed:   BG log data assessment  BP log data assessment  Labs ordered and evaluated  complex treatment plan >1 DS   I spent a total of 40 minutes face to face with the patient delivering clinical care and providing education/counseling.    All questions/concerns were addressed to the patient's satisfaction.  __________________________________________  Jordan Likes, PharmD, CPP  Solid Organ Transplant Clinical Pharmacist Practitioner

## 2022-02-18 NOTE — Unmapped (Signed)
TRANSPLANT SURGERY PROGRESS NOTE    Assessment and Plan  - plan to see at 6 week follow-up when he is expected to get staples removed  - Had figure of 8 stitch placed today toward junction of mercedes incision (cephalad) to control continued draining ascites (administered lidocaine 1%; 3-0 nylon). We explained to the patient that the ascites may be one of the last symptoms to resolve after OLT and that it can take 2-3 months to completely resolve.       Subjective  Matthew Key is a 61 y.o. 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 Celine Mans.     Pt is doing well since going home. He complains of difficulty sleeping due to jumpiness that wakes him up. He also complains of continued ascites drainage from the cephalad aspect of his incision and pruritus of his lower legs. Patient denies recent chest pain, shortness of breath, dizziness, weakness, nausea, vomiting, headaches, and he endorses being able to ambulate independently (often uses a walker for support). His PO intake has been adequate and he was encouraged to continue drinking enough fluids and to take his ensure shakes between meals.       Objective    Vitals:    02/18/22 1011   BP: 122/57   BP Site: R Arm   BP Position: Sitting   Pulse: 69   Temp: 36.6 ??C (97.8 ??F)   TempSrc: Tympanic   Weight: 96.3 kg (212 lb 3.2 oz)   Height: 172.7 cm (5' 7.99)      Body mass index is 32.27 kg/m??.     Physical Exam:    General Appearance:   No acute distress  Lungs:               Chest rise symmetric and unlabored breathing on                           room air  Heart:                           HDS  Abdomen:                Soft, non-tender. Some fluid wave still present 2/2 ascites. Incision looks c/d/I and is healing well  Extremities:              Warm and well perfused, trace edema in bilateral lower extremities      Data Review:  All lab results last 24 hours:    Recent Results (from the past 24 hour(s))   TACROLIMUS LEVEL    Collection Time: 02/18/22  8:23 AM   Result Value Ref Range    Tacrolimus, Timed 8.6 ng/mL   Gamma GT    Collection Time: 02/18/22  8:23 AM   Result Value Ref Range    GGT 154 (H) 0 - 73 U/L   Magnesium Level    Collection Time: 02/18/22  8:23 AM   Result Value Ref Range    Magnesium 1.7 1.6 - 2.6 mg/dL   Phosphorus Level    Collection Time: 02/18/22  8:23 AM   Result Value Ref Range    Phosphorus 5.7 (H) 2.4 - 5.1 mg/dL   Bilirubin, Direct  Collection Time: 02/18/22  8:23 AM   Result Value Ref Range    Bilirubin, Direct 0.50 (H) 0.00 - 0.30 mg/dL   Comprehensive Metabolic Panel    Collection Time: 02/18/22  8:23 AM   Result Value Ref Range    Sodium 141 135 - 145 mmol/L    Potassium 5.2 (H) 3.4 - 4.8 mmol/L    Chloride 105 98 - 107 mmol/L    CO2 29.0 20.0 - 31.0 mmol/L    Anion Gap 7 5 - 14 mmol/L    BUN 37 (H) 9 - 23 mg/dL    Creatinine 1.61 (H) 0.60 - 1.10 mg/dL    BUN/Creatinine Ratio 20     eGFR CKD-EPI (2021) Male 40 (L) >=60 mL/min/1.41m2    Glucose 119 70 - 179 mg/dL    Calcium 8.6 (L) 8.7 - 10.4 mg/dL    Albumin 3.2 (L) 3.4 - 5.0 g/dL    Total Protein 5.5 (L) 5.7 - 8.2 g/dL    Total Bilirubin 0.9 0.3 - 1.2 mg/dL    AST 18 <=09 U/L    ALT 60 (H) 10 - 49 U/L    Alkaline Phosphatase 114 46 - 116 U/L   CBC w/ Differential    Collection Time: 02/18/22  8:23 AM   Result Value Ref Range    WBC 11.8 (H) 3.6 - 11.2 10*9/L    RBC 2.78 (L) 4.26 - 5.60 10*12/L    HGB 8.9 (L) 12.9 - 16.5 g/dL    HCT 60.4 (L) 54.0 - 48.0 %    MCV 94.3 77.6 - 95.7 fL    MCH 31.9 25.9 - 32.4 pg    MCHC 33.8 32.0 - 36.0 g/dL    RDW 98.1 (H) 19.1 - 15.2 %    MPV 9.2 6.8 - 10.7 fL    Platelet 253 150 - 450 10*9/L    Neutrophils % 83.3 %    Lymphocytes % 8.9 %    Monocytes % 6.1 %    Eosinophils % 0.9 %    Basophils % 0.8 %    Absolute Neutrophils 9.8 (H) 1.8 - 7.8 10*9/L    Absolute Lymphocytes 1.0 (L) 1.1 - 3.6 10*9/L    Absolute Monocytes 0.7 0.3 - 0.8 10*9/L    Absolute Eosinophils 0.1 0.0 - 0.5 10*9/L    Absolute Basophils 0.1 0.0 - 0.1 10*9/L    Anisocytosis Slight (A) Not Present         Imaging: reviewed      Warrick Parisian, MD  PGY-1 General Surgery

## 2022-02-18 NOTE — Unmapped (Signed)
Please see pharmacy note for documentation.

## 2022-02-18 NOTE — Unmapped (Signed)
Set up for welcome/onboarding to [facility]

## 2022-02-19 DIAGNOSIS — Z944 Liver transplant status: Principal | ICD-10-CM

## 2022-02-19 DIAGNOSIS — Z796 Long-term use of immunosuppressant medication: Principal | ICD-10-CM

## 2022-02-19 MED ORDER — INSULIN ASPART (U-100) 100 UNIT/ML (3 ML) SUBCUTANEOUS PEN
6 refills | 0 days | Status: CP
Start: 2022-02-19 — End: ?

## 2022-02-19 NOTE — Unmapped (Signed)
Called pt to make virtual nutrition appt on 7/13. Pt very upset and teary about his marital situation and lack of support post op. Listened and supported pt. Reminded him of his care team at [facility] esp SW and psych. He asked if wife could participate in call so she can understand better what he's going through. Told him this tpa would share that request with SW and psych. Scheduled nutrition appt and pt verbalized understanding of all discussed.

## 2022-02-20 NOTE — Unmapped (Signed)
Patient's 6/28 explant reviewed today in Pathology conference by Dr. Julious Oka in the presence of Drs.Moon and [employer]. Liver noted as cirrhotic with no clear causation and  no malignancy. 

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
## 2022-02-21 ENCOUNTER — Telehealth: Admit: 2022-02-21 | Discharge: 2022-02-22 | Payer: MEDICARE

## 2022-02-21 DIAGNOSIS — Z944 Liver transplant status: Principal | ICD-10-CM

## 2022-02-21 DIAGNOSIS — Z796 Long-term use of immunosuppressant medication: Principal | ICD-10-CM

## 2022-02-21 DIAGNOSIS — E875 Hyperkalemia: Principal | ICD-10-CM

## 2022-02-21 LAB — CBC W/ DIFFERENTIAL
BASOPHILS ABSOLUTE COUNT: 0.1 10*3/uL (ref 0.0–0.2)
BASOPHILS RELATIVE PERCENT: 1 %
EOSINOPHILS ABSOLUTE COUNT: 0.2 10*3/uL (ref 0.0–0.4)
EOSINOPHILS RELATIVE PERCENT: 2 %
HEMATOCRIT: 25.1 % — ABNORMAL LOW (ref 37.5–51.0)
HEMOGLOBIN: 8.2 g/dL — ABNORMAL LOW (ref 13.0–17.7)
IMMATURE CELLS: 1 %
LYMPHOCYTES ABSOLUTE COUNT: 0.8 10*3/uL (ref 0.7–3.1)
LYMPHOCYTES RELATIVE PERCENT: 11 %
MEAN CORPUSCULAR HEMOGLOBIN CONC: 32.7 g/dL (ref 31.5–35.7)
MEAN CORPUSCULAR HEMOGLOBIN: 31.4 pg (ref 26.6–33.0)
MEAN CORPUSCULAR VOLUME: 96 fL (ref 79–97)
MONOCYTES ABSOLUTE COUNT: 0.6 10*3/uL (ref 0.1–0.9)
MONOCYTES RELATIVE PERCENT: 8 %
NEUTROPHILS ABSOLUTE COUNT: 5.6 10*3/uL (ref 1.4–7.0)
NEUTROPHILS RELATIVE PERCENT: 77 %
PLATELET COUNT: 272 10*3/uL (ref 150–450)
RED BLOOD CELL COUNT: 2.61 x10E6/uL — AB (ref 4.14–5.80)
RED CELL DISTRIBUTION WIDTH: 15.6 % — ABNORMAL HIGH (ref 11.6–15.4)
WHITE BLOOD CELL COUNT: 7.2 10*3/uL (ref 3.4–10.8)

## 2022-02-21 LAB — BILIRUBIN, DIRECT: BILIRUBIN DIRECT: 0.28 mg/dL

## 2022-02-21 LAB — BASIC METABOLIC PANEL
BLOOD UREA NITROGEN: 34 mg/dL — ABNORMAL HIGH (ref 8–27)
BUN / CREAT RATIO: 18 (ref 10–24)
CREATININE: 1.91 mg/dL — ABNORMAL HIGH (ref 0.76–1.27)
EGFR CKD-EPI NON-AA MALE: 39 mL/min/{1.73_m2} — ABNORMAL LOW
GLUCOSE RANDOM: 79 mg/dL (ref 70–99)
POTASSIUM: 5.8 mmol/L — ABNORMAL HIGH (ref 3.5–5.2)
SODIUM: 141 mmol/L (ref 134–144)

## 2022-02-21 LAB — COMPREHENSIVE METABOLIC PANEL
ALBUMIN/GLOBULIN RATIO: 2.2 (ref 1.2–2.2)
ALBUMIN: 3.3 g/dL — ABNORMAL LOW (ref 3.9–4.9)
ALKALINE PHOSPHATASE: 101 IU/L (ref 44–121)
ALT (SGPT): 28 IU/L (ref 0–44)
AST (SGOT): 13 IU/L (ref 0–40)
BILIRUBIN TOTAL: 0.6 mg/dL (ref 0.0–1.2)
CALCIUM: 8.9 mg/dL (ref 8.6–10.2)
CHLORIDE: 103 mmol/L (ref 96–106)
CO2: 18 mmol/L — ABNORMAL LOW (ref 20–29)
GLOBULIN, TOTAL: 1.5 g/dL (ref 1.5–4.5)
PROTEIN TOTAL: 4.8 g/dL — ABNORMAL LOW (ref 6.0–8.5)

## 2022-02-21 LAB — GAMMA GT: GAMMA GLUTAMYL TRANSFERASE: 106 IU/L — ABNORMAL HIGH (ref 0–65)

## 2022-02-21 LAB — MAGNESIUM: MAGNESIUM: 2 mg/dL (ref 1.6–2.3)

## 2022-02-21 LAB — PHOSPHORUS: PHOSPHORUS: 5.5 — ABNORMAL HIGH

## 2022-02-21 MED ORDER — SODIUM POLYSTYRENE SULFONATE ORAL POWDER
Freq: Every day | ORAL | 0 refills | 0.00000 days | Status: CP
Start: 2022-02-21 — End: 2022-02-21

## 2022-02-21 NOTE — Unmapped (Signed)
Patient's 7/12 K at 5.8, tac pending, but Monday's result was within goal. Cr slightly above Monday's result. Discussed elevated K with PharmD Chargualaf, who recommended either of the following:   Lokelma 10g TID x2 days   OR   Veltassa 8.4g daily x3 days   OR   Kayexalate 15 g daily x2 days       Contacted patient and discussed elevated K result. He denied any chest pain, palpitations, sob or dizziness and provided the names of other local pharmacies to search inventory for recommended meds. Reviewed handout given Monday of K content of common foods. Encouraged patient to reduce his intake of low protein high K foods, such as potatoes and OJ. [email or web address] in itemizing K content of dietary intake. Suggested he review his diet over the last few days and add up the K to consider areas to cut back. Let him know TNC would return call to report which pharmacy the Rx would be sent.     Reached out to local pharmacies until inventory was found of med that could be distributed in recommended qty. Sent Rx for kayexalate to patient's local [facility] and confirmed it was covered by his insurance. Contacted patient and let him know which [facility] the script was sent to and encouraged him to have his family member pick it up tonight for him to take his 1st dose today and 2nd tomorrow morning. Provided verbal instruction to reconstitute with 69ml/2oz of fluid. He verbalized understanding. (Original rx sent to [facility] was for 2g, sent corrected rx immediately for 15 g and verified with [facility] personnel Jonny Ruiz) dosage requested.

## 2022-02-21 NOTE — Unmapped (Signed)
[facility]   Medical Nutrition Therapy Consultation       Visit Type:    Return Assessment    Referral Reason: :  Liver Transplant 02/06/22      Matthew Key is a 61 y.o. male seen for medical nutrition therapy for liver transplant annual evaluation and difficulty eating. His active problem list, medication list and allergies were reviewed.     His interim medical history is significant for elevated creatinine, slight hyperkalemia.    Anthropometrics   Estimated body mass index is 32.27 kg/m?? as calculated from the following:    Height as of 02/18/22: 172.7 cm (5' 7.99).    Weight as of 02/18/22: 96.3 kg (212 lb 3.2 oz).    Wt Readings from Last 5 Encounters:   02/18/22 96.3 kg (212 lb 3.2 oz)   02/11/22 94.8 kg (209 lb)   01/29/22 96.9 kg (213 lb 11.2 oz)   01/14/22 89.8 kg (198 lb)   01/03/22 88.9 kg (196 lb)      Usual body weight: ~201 lb at home today on home scale   198 lbs per patient prior to transplant  Ideal Body Weight:   69.9 kg       Nutrition Risk Screening:     Nutrition Focused Physical Exam:    Unable to assess given virtual visit             Malnutrition Screening:   Malnutrition Assessment using AND/ASPEN Clinical Characteristics:     Unable to assess given inability to perform nutrition focused physical exam        Biochemical Data, Medical Tests and Procedures:  All pertinent labs and imaging reviewed by Idolina Primer at 8:47 AM 02/21/2022.    Patient has a Libre 14 since coming home. His sugar is 154 during visit. He reports his blood glucose average for the past week has been 147.      Lab Results   Component Value Date    Hemoglobin A1C 5.2 02/05/2022    Hemoglobin A1C 4.7 (L) 01/14/2022    Hemoglobin A1C 4.6 (L) 12/11/2021      No results found for: VITAMINA  Lab Results   Component Value Date    Vitamin D Total (25OH) 27.9 09/11/2020    Vitamin D Total (25OH) 13.9 (L) 05/25/2019     No results found for: VITAME  No results found for: CRP  No results found for: ZINC  No results found for: COPPER    Lab Results   Component Value Date    BUN 37 (H) 02/18/2022    CREATININE 1.87 (H) 02/18/2022    GFRAA 84 04/05/2019    GFRNONAA 72 04/05/2019    NA 141 02/18/2022    K 5.2 (H) 02/18/2022    CL 105 02/18/2022    CO2 29.0 02/18/2022    CALCIUM 8.6 (L) 02/18/2022    PHOS 5.7 (H) 02/18/2022    ALBUMIN 3.2 (L) 02/18/2022       Medications and Vitamin/Mineral Supplementation:   All nutritionally pertinent medications reviewed on 02/21/2022.   Nutritionally pertinent medications include: prednisone, novolog, tresiba, colace, pantoprazole  He is not taking nutrition supplements. Magnesium oxide      Current Outpatient Medications   Medication Sig Dispense Refill    acetaminophen (TYLENOL) 325 MG tablet Take 1-2 tablets (325-650 mg total) by mouth every six (6) hours as needed for pain or fever. 100 tablet 11    aspirin (ECOTRIN) 81 MG tablet  Take 1 tablet (81 mg total) by mouth daily. 90 tablet 3    blood sugar diagnostic (ACCU-CHEK GUIDE TEST STRIPS) Strp Use to check blood sugar as directed with insulin 3 times a day & for symptoms of high or low blood sugar 100 strip 11    blood-glucose meter Misc Use to test blood sugar as directed 1 each 0    docusate sodium (COLACE) 100 MG capsule Take 1 capsule (100 mg total) by mouth two (2) times a day as needed for constipation. 60 capsule 11    gabapentin (NEURONTIN) 300 MG capsule Take 1 capsule (300 mg total) by mouth at bedtime. 90 capsule 3    glucagon 3 mg/actuation Spry Use 1 spray into the left nostril every fifteen (15) minutes as needed (low blood sugar). 2 each 1    hydrOXYzine (VISTARIL) 50 MG capsule Take 1 capsule (50 mg total) by mouth nightly as needed for anxiety. 30 capsule 3    insulin ASPART (NOVOLOG FLEXPEN) 100 unit/mL (3 mL) injection pen Take 4 units subcutaneous with meals as directed along with correction if needed. If premeal BG 151-200 take 2 additional units for correction, if 201-250 take 4 additional units, etc. Max dose 48 units per day 15 mL 6    insulin degludec (TRESIBA FLEXTOUCH U-100) 100 unit/mL (3 mL) InPn Inject 0.26 mL (26 Units total) under the skin daily. Adjust as instructed. 15 mL 11    lancets Misc Use to check blood sugar as directed with insulin 3 times a day & for symptoms of high or low blood sugar. 100 each 0    magnesium oxide-Mg AA chelate (MAGNESIUM, AMINO ACID CHELATE,) 133 mg Take 1 tablet by mouth two (2) times a day. Hold until directed to take by nurse coordinator 100 tablet 3    melatonin 3 mg Tab Take 1 tablet (3 mg total) by mouth every evening. 30 tablet 11    methocarbamoL (ROBAXIN) 500 MG tablet Take 1 tablet (500 mg total) by mouth Three (3) times a day as needed. 90 tablet 0    mycophenolate (CELLCEPT) 250 mg capsule Take 4 capsules (1,000 mg total) by mouth Two (2) times a day. 240 capsule 11    oxyCODONE (ROXICODONE) 5 MG immediate release tablet Take 1-2 tablets (5-10 mg total) by mouth every six (6) hours as needed for pain. 25 tablet 0    pantoprazole (PROTONIX) 40 MG tablet Take 1 tablet (40 mg total) by mouth daily. 30 tablet 11    pen needle, diabetic (BD ULTRA-FINE NANO PEN NEEDLE) 32 gauge x 5/32 (4 mm) Ndle Use as directed  1-2 times per day 100 each 12    predniSONE (DELTASONE) 2.5 MG tablet Take 2 tablets (5 mg total) by mouth daily for 5 days, THEN 1 tablet (2.5 mg total) daily for 16 days. 26 tablet 0    sulfamethoxazole-trimethoprim (BACTRIM) 400-80 mg per tablet Take 1 tablet (80 mg of trimethoprim total) by mouth 3 (three) times a week. 12 tablet 4    tacrolimus (PROGRAF) 1 MG capsule Take 9 capsules (9 mg total) by mouth two (2) times a day. 540 capsule 11    tamsulosin (FLOMAX) 0.4 mg capsule Take 1 capsule (0.4 mg total) by mouth nightly. 30 capsule 3    valGANciclovir (VALCYTE) 450 mg tablet Take 1 tablet (450 mg total) by mouth daily. 30 tablet 2     No current facility-administered medications for this visit.       Nutrition History:  Dietary Restrictions: No known food allergies or food intolerances.     Gastrointestinal Issues: Denied issues No pain or nausea, just not feeling like he can eat much. Sometimes has pain on right side on stitches     Hunger and Satiety: Poor appetite and limited intake.   Early satiety.   Patient reports staples have been painful when he eats too much. He has limited his portions     Food Safety and Access: No to little issues noted.     Diet Recall:    Time Intake   Breakfast Boost Original    Snack (AM)    Lunch Protein + vegetable (usually lima beans or green beans)   Snack (PM) Boost Original   Dinner Cheeseburger and spinach salad     Snack (HS) Boost Original      Food-Related History:    Beverages: 3-4 bottles of water per day, Boost Original - Chocolate; OJ occasionally  Dining Out:  Minimal   Usual Food Choices: peas, green beans, peanut butter, spinach, lima beans, green beans, milk, macaroni & cheese, chicken, brown rice   Does not eat fruit per his report     Physical Activity:  Physical activity level is sedentary with little to no exercise.    He has been trying to recover post transplant. He walks around the house but has not been exerting himself much. He reports his staples have been very itchy and uncomfortable.      Daily Estimated Nutrient Needs:  Energy: 2100- 2450 kcals [30- 35 kcal/kg using ideal body weight, 70 kg]  Protein: 105- 140 gm [1.5- 2.0 gm/kg using ideal body weight, 70 kg]  Fluid:   [per MD team]  Sodium:  <2000 mg     Nutrition Goals & Evaluation      1. Meet estimated daily needs (Progressing)  2. Normal vitamin levels (Ongoing)  3. Balanced macronutrient intake (Progressing)  4. Hemoglobin A1c <7% (Met)    Nutrition goals reviewed, and relevant barriers identified and addressed: emotional state. He is evaluated to have good willingness and ability to achieve nutrition goals.     Nutrition Assessment     Per the patient's diet recall, nutrition intake is inadequate and does not meet estimated nutrition needs. Patient's intake of Boost shakes is appropriate for post transplant. Discussed importance of having no more than 2 bottles per day and eating 2-3 snacks per day of peanut butter, eggs etc to improve labs and overall nutrition. Boost Original has ~470 mg K+ but with his other PO intake he is still consuming a low potassium diet if he is only drinking 2 bottles per day. Discussed potassium intake and recommended monitoring his intake if potassium remains high. Encouraged patient to have 1-2 small snacks throughout the day. Patient with relatively good glucose control post transplant in light of prednisone. He is happy to have his Josephine Igo back in place.     Nutrition Intervention      Nutrition Education: transplant goals and expectations     Materials Provided were: verbal tips and suggestions     Nutrition Plan:    Continue food safety precautions   Consume 2 oral nutrition supplements daily , especially until solid food intake increases   Eat 4 small, frequent protein containing meals per day   Space meals out every 3-4 hours while awake  Increase exercise/safe movement to 30 minutes, 5 days per week     Follow up will occur at 3 months post transplant.  Food/Nutrition-related history and Anthropometric measurements will be assessed at time of follow-up.         Recommendations for Care Team:  Monitor potassium level and blood glucoses          The patient reports they are currently: at home. I spent 36 minutes on the real-time audio and video visit with the patient on the date of service. I spent an additional 27 minutes on pre- and post-visit activities on the date of service.     The patient was not located and I was located within 250 yards of a hospital based location during the real-time audio and video visit. The patient was physically located in [place] or a state in which I am permitted to provide care. The patient and/or parent/guardian understood that s/he may incur co-pays and cost sharing, and agreed to the telemedicine visit. The visit was reasonable and appropriate under the circumstances given the patient's presentation at the time.    The patient and/or parent/guardian has been advised of the potential risks and limitations of this mode of treatment (including, but not limited to, the absence of in-person examination) and has agreed to be treated using telemedicine. The patient's/patient's family's questions regarding telemedicine have been answered.    If the visit was completed in an ambulatory setting, the patient and/or parent/guardian has also been advised to contact their provider???s office for worsening conditions, and seek emergency medical treatment and/or call 911 if the patient deems either necessary.      Lanelle Bal, RD, LDN  Abdominal Transplant Dietitian   Pager: [phone]

## 2022-02-22 NOTE — Unmapped (Unsigned)
This onboarding is for the following medications:  1) Cellcept  2) Prograf  3) Valcyte        [facility]   Patient Onboarding/Medication Counseling    [name] is a 61 y.o. male with a liver transplant who I am counseling today on continuation of therapy.  I am speaking to the patient.    Was a Nurse, learning disability used for this call? No    Verified patient's date of birth / HIPAA.    Specialty medication(s) to be sent: Transplant: None- patient has at least 2 weeks of medicine on hand.      Non-specialty medications/supplies to be sent: none      Medications not needed at this time: none       The patient declined counseling on missed dose instructions, goals of therapy, side effects and monitoring parameters, warnings and precautions, drug/food interactions, and storage, handling precautions, and disposal because they have taken the medication previously. The information in the declined sections below are for informational purposes only and was not discussed with patient.     Valcyte (valganciclovir)    Medication & Administration     Dosage:   Take one tablet daily    Administration:   Take with food  Swallow the pills whole, do not break, crush, or chew    Adherence/Missed dose instructions:  Take a missed dose as soon as you think about it with food  If it is close to your next dose, skip the missed dose and go back to your normal time.  Do not take 2 doses at the same time or extra doses.  Report any missed doses to coordinator    Goals of Therapy     To prevent or treat CMV infection in setting of solid organ transplant    Side Effects & Monitoring Parameters   Common side effects  Headache  Diarrhea or constipation  Appetite or sleep disturbances  Back, muscle, joint, or belly pain  Weight loss  Dizziness  Muscle spasm  Upset stomach or vomiting    The following side effects should be reported to the provider:  Allergic reaction  (rash, hives, swelling, blistered or peeling skin, shortness of breath)  Infection (fever, chills, sore throat, ear/sinus pain, cough, sputum change, urinary pain, mouth sores, non-healing wounds)  Bleeding (cough ground vomit, blood in urine, black/red/tarry stools, unexplained bruising or bleeding)  Electrolyte problems (mood changes, confusion, weakness, abnormal heartbeat, seizures)  Kidney problems (urine changes, weight gain)  Yellowing skin or eyes  Swelling in arms, legs, stomach  Severe dizziness or passing out  Eye issues (eyesight changes, pain, or irritation)  Night sweats    Monitoring parameters  Have eye exam as directed by doctor  CMV counts  CBC  Renal function  Pregnancy test prior to initiation    Contraindications, Warnings, & Precautions   BBW: severe leukopenia, neutropenia, anemia, thrombocytopenia, pancytopenia, and bone marrow failure, including aplastic anemia have been reported  BBW: may cause temporary or permanent inhibition of spermatogenesis and suppression of fertilty; has the potential to cause birth defects and cancers in humans  Male patients should have pregnancy test prior to initiation and use birth control for at least 30 days after discontinuation  Male patients should use a barrier contraceptive while on therapy and for 90 days after discontinuation  Acute renal failure  Not indicated for use in liver transplant recipients  Breastfeeding is not recommended    Drug/Food Interactions   Medication list reviewed  in Epic. The patient was instructed to inform the care team before taking any new medications or supplements. No drug interactions identified.   Check with your doctor before getting any vaccinations (live or inactivated)    Storage, Handling Precautions, & Disposal   Store at room temperature  Keep away from children and pets      The patient declined counseling on missed dose instructions, goals of therapy, side effects and monitoring parameters, warnings and precautions, drug/food interactions, and storage, handling precautions, and disposal because they have taken the medication previously. The information in the declined sections below are for informational purposes only and was not discussed with patient.     Prograf (tacrolimus)    Medication & Administration     Dosage: Take 7 capsules (7mg  total) two times a day.     Administration:   May take with or without food  Take 12 hours apart    Adherence/Missed dose instructions:  Take a missed dose as soon as you think about it.  If it is close to the time for your next dose, skip the missed dose and go back to your normal time.  Do not take 2 doses at the same time or extra doses.    Goals of Therapy     To prevent organ rejection    Side Effects & Monitoring Parameters     Common side effects  Dizziness  Fatigue  Headache  Stuffy nose or sore throat  Nausea, vomiting, stomach pain, diarrhea, constipation  Heartburn  Back or joint pain  Increased risk of infection    The following side effects should be reported to the provider:  Allergic reaction  Kidney issues (change in quantity or urine passed, blood in urine, or weight gain)  High blood pressure (dizziness, change in eyesight, headache)  Electrolyte issues (change in mood, confusion, muscle pain, or weakness)  Abnormal breathing  Shakiness  Unexplained bleeding or bruising (gums bleeding, blood in urine, nosebleeds, any abnormal bleeding)  Signs of infection (fever, cough, wounds that will not heal)  Skin changes (sores, paleness, new or changed bumps or moles)    Monitoring Parameters  Renal function  Liver function  Glucose levels  Blood pressure  Tacrolimus trough levels  Cardiac monitoring (for QT prolongation)      Contraindications, Warnings, & Precautions     Black Box Warning: Infections - immunosuppressant agents increase the risk of infection that may lead to hospitalization or death  Black Box Warning: Malignancy - immunosuppressant agents may be associated with the development of malignancies that may lead to hospitalization or death  Limit or avoid sun and ultraviolet light exposure, use appropriate sun protection  Myocardial hypertrophy -avoid use in patients with congenital long QT syndrome  Diabetes mellitus - the risk for new-onset diabetes and insulin-dependent post-transplant diabetes mellitus is increased with tacrolimus use after transplantation  GI perforation  Hyperkalemia  Hypertension  Nephrotoxicity  Neurotoxicity  This is a narrow therapeutic index drug. Do not switch manufacturers without first talking to the provider.    Drug/Food Interactions     Medication list reviewed in Epic. The patient was instructed to inform the care team before taking any new medications or supplements. No drug interactions identified.   Avoid alcohol  Avoid grapefruit or grapefruit juice  Avoid live vaccines    Storage, Handling Precautions, & Disposal     Store at room temperature  Keep away from children and pets    The patient declined counseling on missed  dose instructions, goals of therapy, side effects and monitoring parameters, warnings and precautions, drug/food interactions, and storage, handling precautions, and disposal because they have taken the medication previously. The information in the declined sections below are for informational purposes only and was not discussed with patient.     Cellcept (mycopheonlate mofetil)    Medication & Administration     Dosage: Take 4  capsules  (1000mg ) by mouth two times daily    Administration:   Take by mouth with or without food.   Taking with food can minimize GI side effects.   Swallow capsules whole, do not crush or chew.  Oral suspension should be shaken well prior to administration.  Do not mix with other medications and discard any unused portion 60 days after constitution.      Adherence/Missed dose instructions:  Take a missed dose as soon as you remember it . If it is close to the time of your next dose, skip the missed dose and resume your normal schedule.Never take 2 doses to try and catch up from a missed dose.    Goals of Therapy     Prevent organ rejection    Side Effects & Monitoring Parameters     Feeling tired or weak  Shakiness  Trouble sleeping  Diarrhea, abdominal pain, nausea, vomiting, constipation or decreased appetite  Decreases in blood counts   Back or joint pain  Hypertension or hypotension  High blood sugar  Headache  Skin rash    The following side effects should be reported to the provider:  Reduced immune function - report signs of infection such as fever; chills; body aches; very bad sore throat; ear or sinus pain; cough; more sputum or change in color of sputum; pain with passing urine; wound that will not heal, etc.  Also at a slightly higher risk of some malignancies (mainly skin and blood cancers) due to this reduced immune function.  Allergic reaction (rash, hives, swelling, shortness of breath)  High blood sugar (confusion, feeling sleepy, more thirst, more hungry, passing urine more often, flushing, fast breathing, or breath that smells like fruit)  Electrolyte issues (mood changes, confusion, muscle pain or weakness, a heartbeat that does not feel normal, seizures, not hungry, or very bad upset stomach or throwing up)  High or low blood pressure (bad headache or dizziness, passing out, or change in eyesight)  Kidney issues (unable to pass urine, change in how much urine is passed, blood in the urine, or a big weight gain)  Skin (oozing, heat, swelling, redness, or pain), UTI and other infections   Chest pain or pressure  Abnormal heartbeat  Unexplained bleeding or bruising  Abnormal burning, numbness, or tingling  Muscle cramps,  Yellowing of skin or eyes    Monitoring parameters  Pregnancy   CBC   Renal and hepatic function    Contraindications, Warnings, & Precautions     *This is a REMS drug and an FDA-approved patient medication guide will be printed with each dispensation  Black Box Warning: Infections   Black Box Warning: Lymphoproliferative disorders - risk of development of lymphoma and skin malignancy is increased  Black Box Warning: Use during pregnancy is associated with increased risks of first trimester pregnancy loss and congenital malformations.   Black Box Warning: Females of reproductive potential should use contraception during treatment and for 6 weeks after therapy is discontinued  Is patient using an effective method of contraception? No  If yes, method of contraception:  n/a  CNS  depression  New or reactivated viral infections  Neutropenia  Male patients and/or their male partners should use effective contraception during treatment of the male patient and for at least 3 months after last dose.  Breastfeeding is not recommended during therapy and for 6 weeks after last dose    Drug/Food Interactions     Medication list reviewed in Epic. The patient was instructed to inform the care team before taking any new medications or supplements. No drug interactions identified.   Separate doses of antacids and this medication  Check with your doctor before getting any vaccinations    Storage, Handling Precautions, & Disposal     Store at room temperature in a dry place  This medication is considered hazardous. Wash hands after handling and store out of reach or others, including children and pets.      Current Medications (including OTC/herbals), Comorbidities and Allergies     Current Outpatient Medications   Medication Sig Dispense Refill    acetaminophen (TYLENOL) 325 MG tablet Take 1-2 tablets (325-650 mg total) by mouth every six (6) hours as needed for pain or fever. 100 tablet 11    aspirin (ECOTRIN) 81 MG tablet Take 1 tablet (81 mg total) by mouth daily. 90 tablet 3    blood sugar diagnostic (ACCU-CHEK GUIDE TEST STRIPS) Strp Use to check blood sugar as directed with insulin 3 times a day & for symptoms of high or low blood sugar 100 strip 11    blood-glucose meter Misc Use to test blood sugar as directed 1 each 0 docusate sodium (COLACE) 100 MG capsule Take 1 capsule (100 mg total) by mouth two (2) times a day as needed for constipation. 60 capsule 11    gabapentin (NEURONTIN) 300 MG capsule Take 1 capsule (300 mg total) by mouth at bedtime. 90 capsule 3    glucagon 3 mg/actuation Spry Use 1 spray into the left nostril every fifteen (15) minutes as needed (low blood sugar). 2 each 1    hydrOXYzine (VISTARIL) 50 MG capsule Take 1 capsule (50 mg total) by mouth nightly as needed for anxiety. 30 capsule 3    insulin ASPART (NOVOLOG FLEXPEN) 100 unit/mL (3 mL) injection pen Take 4 units subcutaneous with meals as directed along with correction if needed. If premeal BG 151-200 take 2 additional units for correction, if 201-250 take 4 additional units, etc. Max dose 48 units per day 15 mL 6    insulin degludec (TRESIBA FLEXTOUCH U-100) 100 unit/mL (3 mL) InPn Inject 0.26 mL (26 Units total) under the skin daily. Adjust as instructed. 15 mL 11    lancets Misc Use to check blood sugar as directed with insulin 3 times a day & for symptoms of high or low blood sugar. 100 each 0    magnesium oxide-Mg AA chelate (MAGNESIUM, AMINO ACID CHELATE,) 133 mg Take 1 tablet by mouth two (2) times a day. Hold until directed to take by nurse coordinator 100 tablet 3    melatonin 3 mg Tab Take 1 tablet (3 mg total) by mouth every evening. 30 tablet 11    methocarbamoL (ROBAXIN) 500 MG tablet Take 1 tablet (500 mg total) by mouth Three (3) times a day as needed. 90 tablet 0    mycophenolate (CELLCEPT) 250 mg capsule Take 4 capsules (1,000 mg total) by mouth Two (2) times a day. 240 capsule 11    oxyCODONE (ROXICODONE) 5 MG immediate release tablet Take 1-2 tablets (5-10 mg total) by mouth every six (  6) hours as needed for pain. 25 tablet 0    pantoprazole (PROTONIX) 40 MG tablet Take 1 tablet (40 mg total) by mouth daily. 30 tablet 11    pen needle, diabetic (BD ULTRA-FINE NANO PEN NEEDLE) 32 gauge x 5/32 (4 mm) Ndle Use as directed  1-2 times per day 100 each 12    predniSONE (DELTASONE) 2.5 MG tablet Take 2 tablets (5 mg total) by mouth daily for 5 days, THEN 1 tablet (2.5 mg total) daily for 16 days. 26 tablet 0    sulfamethoxazole-trimethoprim (BACTRIM) 400-80 mg per tablet Take 1 tablet (80 mg of trimethoprim total) by mouth 3 (three) times a week. 12 tablet 4    tacrolimus (PROGRAF) 1 MG capsule Take 9 capsules (9 mg total) by mouth two (2) times a day. 540 capsule 11    tamsulosin (FLOMAX) 0.4 mg capsule Take 1 capsule (0.4 mg total) by mouth nightly. 30 capsule 3    valGANciclovir (VALCYTE) 450 mg tablet Take 1 tablet (450 mg total) by mouth daily. 30 tablet 2     No current facility-administered medications for this visit.       Allergies   Allergen Reactions    Venom-Honey Bee Swelling       Patient Active Problem List   Diagnosis    Hyponatremia    Leukocytosis    Non-alcoholic micronodular cirrhosis of liver (CMS-HCC)    Pleural effusion, right    Primary insomnia    OSA on CPAP    Thrombocytopenia (CMS-HCC)    Type 2 diabetes mellitus without complication (CMS-HCC)    Spontaneous bacterial peritonitis (CMS-HCC)    Anemia    Left upper extremity swelling    Adrenal insufficiency (CMS-HCC)    Acute deep vein thrombosis (DVT) of brachial vein of left upper extremity (CMS-HCC)    Acute thrombosis of right basilic vein    Hypofibrinogenemia (CMS-HCC)    Abdominal ascites    Pleural effusion associated with hepatic disorder    Essential hypertension    Acute medial meniscus tear of right knee    AKI (acute kidney injury) (CMS-HCC)    Anxiety    Cerebrovascular accident (CVA) (CMS-HCC)    Cervical radicular pain    Cervical radiculopathy    Chronic bilateral low back pain without sciatica    Neck pain, chronic    COVID-19    De Quervain's tenosynovitis, left    Duodenal ulcer    Elevated BUN    Elevated liver enzymes    Erectile dysfunction    Esophageal varices determined by endoscopy (CMS-HCC)    Fatty liver    Gastroesophageal reflux disease History of stroke without residual deficits    Hospital discharge follow-up    Hyperlipidemia    Lactic acidosis    Obesity (BMI 30-39.9)    Right shoulder pain    Right upper quadrant abdominal pain    Tubular adenoma of colon    Water retention    Hypertension associated with diabetes (CMS-HCC)    Acute metabolic encephalopathy    Decompensated hepatic cirrhosis (CMS-HCC)    Hyperkalemia    Hypotension    Liver transplant status (CMS-HCC)       Reviewed and up to date in Epic.    Appropriateness of Therapy     Acute infections noted within Epic:  No active infections  Patient reported infection: None    Is medication and dose appropriate based on diagnosis and infection status? Yes    Prescription has been  clinically reviewed: Yes      Baseline Quality of Life Assessment      How many days over the past month did your liver transplant  keep you from your normal activities? For example, brushing your teeth or getting up in the morning. 3 days    Financial Information     Medication Assistance provided: None Required    Anticipated copay of $0 each tacrolimus and mycophenolate and valganciclovir reviewed with patient. Verified delivery address.    Delivery Information     Scheduled delivery date: Patient has at least 2 weeks of medicine on hand and does not need anything today.    Expected start date: Patient has medication at home on hand and is taking.    Medication will be delivered via UPS to the prescription address in [facility]  This shipment will not require a signature.      Explained the services we provide at [facility] and that each month we would call to set up refills.  Stressed importance of returning phone calls so that we could ensure they receive their medications in time each month.  Informed patient that we should be setting up refills 7-10 days prior to when they will run out of medication.  A pharmacist will reach out to perform a clinical assessment periodically. Informed patient that a welcome packet, containing information about our pharmacy and other support services, a Notice of Privacy Practices, and a drug information handout will be sent.      The patient or caregiver noted above participated in the development of this care plan and knows that they can request review of or adjustments to the care plan at any time.      Patient or caregiver verbalized understanding of the above information as well as how to contact the pharmacy at [phone] option 4 with any questions/concerns.  The pharmacy is open Monday through Friday 8:30am-4:30pm.  A pharmacist is available 24/7 via pager to answer any clinical questions they may have.    Patient Specific Needs     Does the patient have any physical, cognitive, or cultural barriers? No    Does the patient have adequate living arrangements? (i.e. the ability to store and take their medication appropriately) Yes    Did you identify any home environmental safety or security hazards? No    Patient prefers to have medications discussed with  Patient     Is the patient or caregiver able to read and understand education materials at a high school level or above? Yes    Patient's primary language is  English     Is the patient high risk? Yes, patient is taking a REMS drug. Medication is dispensed in compliance with REMS program    Tera Helper  [facility] Specialty Pharmacist Tera Helper  [facility] Specialty Pharmacist

## 2022-02-22 NOTE — Unmapped (Cosign Needed)
[facility] CLINIC PHARMACY NOTE  Matthew Key  [id number]    Medication changes today:   1. Decrease Tresiba to 12 units HS  2. Decrease Novolog to sliding scale only  3. Initiate atorvastatin 20 mg daily     Education/Adherence tools provided today:  - Provided updated medication list  - Provided additional education on immunosuppression and transplant related medications including reviewing indications of medications, dosing and side effects  - Provided additional pill box education  - Provided assistance with pill box fill    Follow up items:  1. Goal of understanding indications and dosing of immunosuppression medications  2. BG  3. Consider Envarsus if tremors/knee jerks are problematic    Next visit with pharmacy in 1-2 weeks  ____________________________________________________________________    Matthew Key is a 61 y.o. 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     Immunologic Risk: first transplant    Induction Agent : basiliximab    Donor Factors: DBD    Other PMH significant for Hypertension and Stroke, T2DM, OSA on CPAP    Post op course uncomplicated    Rejection History: NTD  Infection History: NTD  ___________________________________________________________________    Last seen by pharmacy 1 weeks ago    Interval History: Pt was hyperkalemic and received Kayexelate x 2 days.    Seen by pharmacy today for: medication management and pill box fill and adherence education    CC:  Patient complains of  dizziness.    General: Weight loss  Neuro: Dizziness and involuntary leg twitching HS  CV: No issues   Resp: No issues   GI: No issues   GU: No issues  Derm: No issues  Psych: No issues.    Fluid Status:   Edema yes (abdomen) SOB no  Intake: not tracking but about 6, 8 oz. bottles   Output: not tracking   Plan: Pt reported no longer drinking gatorade. Will not add diuretic today and plan to reassess at next week visit.      There were no vitals filed for this visit.  ___________________________________________________________________    Allergies   Allergen Reactions    Venom-Honey Bee Swelling       Medications reviewed in EPIC medication station and updated today by the clinical pharmacist practitioner.    Current Outpatient Medications   Medication Instructions    acetaminophen (TYLENOL) 325-650 mg, Oral, Every 6 hours PRN    aspirin (ECOTRIN) 81 mg, Oral, Daily (standard)    blood sugar diagnostic (ACCU-CHEK GUIDE TEST STRIPS) Strp Use to check blood sugar as directed with insulin 3 times a day & for symptoms of high or low blood sugar    blood-glucose meter Misc Use to test blood sugar as directed    docusate sodium (COLACE) 100 mg, Oral, 2 times a day PRN    gabapentin (NEURONTIN) 300 mg, Oral, At bedtime    glucagon 3 mg/actuation Spry Use 1 spray into the left nostril every fifteen (15) minutes as needed (low blood sugar).    hydrOXYzine (VISTARIL) 50 mg, Oral, Nightly PRN    insulin ASPART (NOVOLOG FLEXPEN) 100 unit/mL (3 mL) injection pen Take 4 units subcutaneous with meals as directed along with correction if needed. If premeal BG 151-200 take 2 additional units for correction, if 201-250 take 4 additional units, etc. Max dose 48 units per day    insulin degludec (TRESIBA FLEXTOUCH U-100) 26 Units, Subcutaneous, Daily (standard), Adjust as instructed.  lancets Misc Use to check blood sugar as directed with insulin 3 times a day & for symptoms of high or low blood sugar.    magnesium oxide-Mg AA chelate (MAGNESIUM, AMINO ACID CHELATE,) 133 mg 1 tablet, Oral, 2 times a day, Hold until directed to take by nurse coordinator    melatonin 3 mg, Oral, Every evening    methocarbamoL (ROBAXIN) 500 mg, Oral, 3 times a day PRN    mycophenolate (CELLCEPT) 1,000 mg, Oral, 2 times a day (standard)    oxyCODONE (ROXICODONE) 5-10 mg, Oral, Every 6 hours PRN    pantoprazole (PROTONIX) 40 mg, Oral, Daily (standard)    pen needle, diabetic (BD ULTRA-FINE NANO PEN NEEDLE) 32 gauge x 5/32 (4 mm) Ndle Use as directed  1-2 times per day    predniSONE (DELTASONE) 2.5 MG tablet Take 2 tablets (5 mg total) by mouth daily for 5 days, THEN 1 tablet (2.5 mg total) daily for 16 days.    sulfamethoxazole-trimethoprim (BACTRIM) 400-80 mg per tablet 80 mg of trimethoprim, Oral, 3 times weekly    tacrolimus (PROGRAF) 9 mg, Oral, 2 times a day    tamsulosin (FLOMAX) 0.4 mg, Oral, Nightly    valGANciclovir (VALCYTE) 450 mg, Oral, Daily (standard)         GRAFT FUNCTION:  LFTs slightly elevated but stable    Lab Results   Component Value Date    AST 13 02/20/2022    AST 34 02/04/2022    ALT 28 02/20/2022    ALT 24 02/04/2022    Total Bilirubin 0.6 02/20/2022    Total Bilirubin 2.6 (H) 02/04/2022        Zero hour biopsy: path pending  Biopsies to date: NTD      Renal Function: improving but above pre txp baseline of 0.9    Lab Results   Component Value Date    Creatinine 1.91 (H) 02/20/2022    Creatinine 1.87 (H) 02/18/2022    Creatinine 1.67 (H) 02/15/2022    Creatinine 1.77 (H) 02/14/2022    Creatinine 1.45 (H) 02/04/2022    Creatinine 1.71 (H) 01/21/2022    Creatinine 1.43 (H) 01/08/2022    Creatinine 1.35 (H) 12/24/2021       Proteinuria/UPC: No/ none.  No results found for: PCRATIOUR    CURRENT IMMUNOSUPPRESSION:    Tacrolimus (Prograf) 9 mg BID  Tacrolimus Goal: 8 - 10   Mycophenolate mofetil (Cellcept) 1000 mg BID    Prednisone 2.5 mg daily thru 7/28     IMMUNOSUPPRESSION DRUG LEVELS:  Lab Results   Component Value Date    Tacrolimus, Trough 6.4 02/15/2022    Tacrolimus, Trough 5.7 02/14/2022    Tacrolimus, Trough 4.4 (L) 02/13/2022    Tacrolimus, Timed 8.6 02/18/2022     No results found for: CYCLO  No results found for: EVEROLIMUS  No results found for: SIROLIMUS    Prograf level is accurate 12 hour trough    Patient  c/o leg twitching when trying to fall asleep at night . Pt notes leg twitching is significantly improved from last week. Pt also notes mild hand tremors. WBC/ANC:  wnl  Lab Results   Component Value Date    WBC 7.2 02/20/2022    WBC 3.7 11/21/2021       Plan: Will maintain current immunosuppression (tac leve pending).  If twitching and tremors become bothersome consider Envarsus. Continue to monitor.      OI Prophylaxis:   CMV Status: D+/ R+, moderate risk .  CMV prophylaxis: valganciclovir 450 mg daily x 3 months per protocol (end 05/09/22)  No results found for: CMVCP  Estimated Creatinine Clearance: 37.3 mL/min (A) (based on SCr of 2.22 mg/dL (H)).    PCP Prophylaxis: bactrim SS 1 tab MWF x 6 months (end 08/08/22)    Thrush: completed in hospital    Patient is  tolerating infectious prophylaxis well    Plan: Continue per protocol. Continue to monitor.      CVA history: asa 81 mg   The 10-year ASCVD risk score (Arnett DK, et al., 2019) is: 8.9%  No results found for: LIPID    Statin therapy: Indicated; currently on no statin (was on atorvastatin 10 mg daily pre txp that was held post op 2/2 elevated LFTs  Plan: start atorvastatin 20 mg daily . Consider increasing to atorvastatin 40 mg daily at next visit. Continue to monitor       BP: Goal < 140/90. Clinic vitals reported above  Home BP ranges: 120-140s/50-60s  Current meds include: none  Plan: within goal. Continue to monitor    Anemia of CKD:  H/H:   Lab Results   Component Value Date    HGB 8.2 (L) 02/20/2022     Lab Results   Component Value Date    HCT 25.1 (L) 02/20/2022     Iron panel:  Lab Results   Component Value Date    IRON 98 05/09/2021    TIBC 170 (L) 05/09/2021    FERRITIN 558.5 (H) 05/09/2021     Lab Results   Component Value Date    Iron Saturation (%) 58 (H) 05/09/2021     Prior ESA use: none post transplnat  Plan: stable. No change.  Continue to monitor.     DM:   Lab Results   Component Value Date    A1C 5.2 02/05/2022   . Goal A1c < 7  History of Dm? Yes: T2DM  Established with endocrinologist/PCP for BG managment? Yes: Dr. Melrose Nakayama with [facility]  Currently on: Tresiba 30 units daily, Novolog 4 units TID AC + SSI  (was on Ozempic 1 mg weekly until stopped 6/19 for hypotension)    Pt reported not requiring Novolog sliding scale for the past week. Pt also noted that he is holding [place] some nights if BG is < 200.     Home BS log:   Breakfast Lunch  Dinner  HS   Date Matthew Key    7/12 127  163  164     7/13   159   272    7/14   114       7/15   147       7/16 114  126         Diet: small portions for breakfast/lunch and normal portion for dinner.  Also drinking Ensure  Exercise:not yet  Hypoglycemia: no  Plan: Decrease Tresiba to 12 units HS and Decrease Novolog to SSI only.  At future visit may consider restarting Ozempic to decrease insulin requirement.  Continue to monitor.       Electrolytes: wnl. Potassium decreased after 2 day course of Kayexelate. Pt reports no longer drinking gatorade.  Lab Results   Component Value Date    Potassium 5.0 (H) 02/25/2022    Potassium, Bld 4.2 02/06/2022    Sodium 141 02/25/2022    Sodium Whole Blood 141 02/06/2022    Magnesium 1.7 02/25/2022    CO2 22.0 02/25/2022  CO2 24 02/04/2022     Meds currently on: none  Plan: No change. Continue to monitor     GI/BM: pt reports 1 BM  Meds currently on: docusate PRN, Miralax PRN, pantoprazole 40 mg daily  Plan: no change. Continue to monitor    Pain: pt reports moderate pain. Pt is using APAP BID to limit use of oxycodone.   Meds currently on: APAP BID, oxycodone 5-10 mg q6h PRN , gabapentin 300 mg HS, methocarbamol 500 mg TID PRN  Plan: No change.  Continue to monitor    Urinary retention: started post top after failed trial of void  Meds currently on: tamsulosin 0.4 mg daily  Plan: Consider stopping at next/future visit if no urinary symptoms    Bone health:   Vitamin D Level: none available. Goal > 30.   Lab Results   Component Value Date    Vitamin D Total (25OH) 27.9 09/11/2020       Lab Results   Component Value Date    Calcium 8.9 02/20/2022    Calcium 8.6 (L) 02/18/2022    Calcium 8.6 02/04/2022 Calcium 9.3 01/21/2022       Last DEXA results:  01/2020: osteopenia (spine T score -1.1, femoral neck -1.7)  Current meds include:   Plan: Vitamin D level  needs to be drawn with next lab schedule.   Consider adding Vitamin D and calcium given osteopenia if D is low.Continue to monitor.     Anxiety:  Meds currently on: hydroxyzine 50 HS PRN (has used once since DC)    Women's/Men's Health:  Matthew Key is a 61 y.o. male. Patient reports no men's/women's health issues  Plan: Continue to monitor    Immunizations:  Influenza [Annual]: Received 06/2021    PCV13: Received 11/2017  PPSV23: Received 03/2020  PCV20: Received 08/2021    Shingrix Zoster [2 doses, 2 - 6 months apart]: Received 05/2020, 07/2020    COVID-19 [3 primary doses, 2 boosters]: 1st dose given 10/2019, 2nd dose given 10/2019, 3rd dose given 05/2020, and Booster given 08/2021 (bivalent)    Immunization status: up to date and documented.    Pharmacy preference:  [facility]  Medication Refills:  N/a  Medication Access:  N/a    Adherence: Patient has average understanding of medications; was able to independently identify names/doses of immunosuppressants and OI meds.  Patient  does fill their own pill box on a regular basis at home.  Patient brought medication card:yes  Pill box: supervised pill box fill    Plan: Supervised pill box fill for pt.  Asked that he bring filled box for pharmacist review to next visit; provided extensive adherence counseling/intervention    Patient was reviewed with  Alveria Apley, NP  who was agreement with the stated plan:     During this visit, the following was completed:   BG log data assessment  BP log data assessment  Labs ordered and evaluated  complex treatment plan >1 DS   I spent a total of 40 minutes face to face with the patient delivering clinical care and providing education/counseling.    All questions/concerns were addressed to the patient's satisfaction.  __________________________________________  Colonel Bald, PharmD Candidate    Hazeline Junker, PharmD, CPP  Abdominal Transplant Clinical Pharmacist Practitioner

## 2022-02-24 NOTE — Unmapped (Signed)
Patient seen in clinic for his first post-liver transplant visit since hospital discharge. He is accompanied by his wife today, who is not designated as his primary caregiver, but has been participating in his VS documentation and history. They report patient is doing well overall, but has had difficulty sleeping d/t being awakened by jerking movements. He is reporting satisfactory pain management, using oxycodone about twice daily. He is not recording much hydration, aside from a couple bottles of water and gatorade. PharmD encouraged him to avoid electrolyte drinks. TNC encouraged him to increase protein supplements to 2-3 daily and to eat several small frequent high protein meals/snacks/day. He denies n, but has reported loose stools. Provided stool testing materials and encouraged him to contact the txp team if he develops watery diarrhea, occurring several times/day. Patient with significant LE edema ~3-4, but no diuretics recommended at this time. Incision intact and well approximated, aside from leaky ( serosanguinous fluid) spot at top sternal arm, where additional stitch was applied today. VS log brought, but wife admitted no VS recorded on Sat morn, since [facility] gathered data. Home weight recorded at 210lb yesterday.Patient seen by pharmacy and SW today. Will arrange visit with nutrition, since couple with dietary questions. Provided literature on dietary modifications for K management for future reference. Spent about 10 minutes in post-liver transplant health maintenance education. Patient and wife verbalized understanding of all discussed. Patient will rtc next week for follow up appt.

## 2022-02-24 NOTE — Unmapped (Signed)
Call placed to check in on pt. Pt had labs drawn at home on Fri, but results are still pending at current time. Pt states he is doing well, took both doses of kayexalate as ordered and confirmed appt for lab draw tomorrow morning at [facility]. Pt has no concerns or complaints at this time.

## 2022-02-25 ENCOUNTER — Other Ambulatory Visit: Admit: 2022-02-25 | Discharge: 2022-02-26 | Payer: MEDICARE

## 2022-02-25 ENCOUNTER — Ambulatory Visit: Admit: 2022-02-25 | Discharge: 2022-02-26 | Payer: MEDICARE | Attending: Family | Primary: Family

## 2022-02-25 ENCOUNTER — Institutional Professional Consult (permissible substitution): Admit: 2022-02-25 | Discharge: 2022-02-26 | Payer: MEDICARE

## 2022-02-25 DIAGNOSIS — Z944 Liver transplant status: Principal | ICD-10-CM

## 2022-02-25 DIAGNOSIS — Z796 Long-term use of immunosuppressant medication: Principal | ICD-10-CM

## 2022-02-25 LAB — CBC W/ AUTO DIFF
BASOPHILS ABSOLUTE COUNT: 0.1 10*9/L (ref 0.0–0.1)
BASOPHILS RELATIVE PERCENT: 1.2 %
EOSINOPHILS ABSOLUTE COUNT: 0.1 10*9/L (ref 0.0–0.5)
EOSINOPHILS RELATIVE PERCENT: 2.1 %
HEMATOCRIT: 27.2 % — ABNORMAL LOW (ref 39.0–48.0)
HEMOGLOBIN: 9.6 g/dL — ABNORMAL LOW (ref 12.9–16.5)
LYMPHOCYTES ABSOLUTE COUNT: 1 10*9/L — ABNORMAL LOW (ref 1.1–3.6)
LYMPHOCYTES RELATIVE PERCENT: 17.2 %
MEAN CORPUSCULAR HEMOGLOBIN CONC: 35.4 g/dL (ref 32.0–36.0)
MEAN CORPUSCULAR HEMOGLOBIN: 31.8 pg (ref 25.9–32.4)
MEAN CORPUSCULAR VOLUME: 89.7 fL (ref 77.6–95.7)
MEAN PLATELET VOLUME: 8.1 fL (ref 6.8–10.7)
MONOCYTES ABSOLUTE COUNT: 0.3 10*9/L (ref 0.3–0.8)
MONOCYTES RELATIVE PERCENT: 4.9 %
NEUTROPHILS ABSOLUTE COUNT: 4.2 10*9/L (ref 1.8–7.8)
NEUTROPHILS RELATIVE PERCENT: 74.6 %
PLATELET COUNT: 318 10*9/L (ref 150–450)
RED BLOOD CELL COUNT: 3.03 10*12/L — ABNORMAL LOW (ref 4.26–5.60)
RED CELL DISTRIBUTION WIDTH: 16.8 % — ABNORMAL HIGH (ref 12.2–15.2)
WBC ADJUSTED: 5.7 10*9/L (ref 3.6–11.2)

## 2022-02-25 LAB — COMPREHENSIVE METABOLIC PANEL
ALBUMIN: 3.7 g/dL (ref 3.4–5.0)
ALKALINE PHOSPHATASE: 116 U/L (ref 46–116)
ALT (SGPT): 16 U/L (ref 10–49)
ANION GAP: 11 mmol/L (ref 5–14)
AST (SGOT): 9 U/L (ref <=34)
BILIRUBIN TOTAL: 0.7 mg/dL (ref 0.3–1.2)
BLOOD UREA NITROGEN: 31 mg/dL — ABNORMAL HIGH (ref 9–23)
BUN / CREAT RATIO: 14
CALCIUM: 9.6 mg/dL (ref 8.7–10.4)
CHLORIDE: 108 mmol/L — ABNORMAL HIGH (ref 98–107)
CO2: 22 mmol/L (ref 20.0–31.0)
CREATININE: 2.22 mg/dL — ABNORMAL HIGH
EGFR CKD-EPI (2021) MALE: 33 mL/min/{1.73_m2} — ABNORMAL LOW (ref >=60)
GLUCOSE RANDOM: 137 mg/dL — ABNORMAL HIGH (ref 70–99)
POTASSIUM: 5 mmol/L — ABNORMAL HIGH (ref 3.4–4.8)
PROTEIN TOTAL: 6.5 g/dL (ref 5.7–8.2)
SODIUM: 141 mmol/L (ref 135–145)

## 2022-02-25 LAB — PHOSPHORUS: PHOSPHORUS: 5.4 mg/dL — ABNORMAL HIGH (ref 2.4–5.1)

## 2022-02-25 LAB — GAMMA GT: GAMMA GLUTAMYL TRANSFERASE: 117 U/L — ABNORMAL HIGH

## 2022-02-25 LAB — BILIRUBIN, DIRECT: BILIRUBIN DIRECT: 0.3 mg/dL (ref 0.00–0.30)

## 2022-02-25 LAB — MAGNESIUM: MAGNESIUM: 1.7 mg/dL (ref 1.6–2.6)

## 2022-02-25 LAB — TACROLIMUS LEVEL: TACROLIMUS BLOOD: 13.7 ng/mL

## 2022-02-25 MED ORDER — ACETAMINOPHEN 325 MG TABLET
ORAL_TABLET | Freq: Four times a day (QID) | ORAL | 11 refills | 13 days | Status: CP | PRN
Start: 2022-02-25 — End: ?
  Filled 2022-02-25: qty 10, 4d supply, fill #0
  Filled 2022-02-25: qty 100, 13d supply, fill #0

## 2022-02-25 MED ORDER — PREDNISONE 2.5 MG TABLET
ORAL_TABLET | Freq: Every day | ORAL | 0 refills | 7 days | Status: CP
Start: 2022-02-25 — End: ?

## 2022-02-25 MED ORDER — INSULIN ASPART (U-100) 100 UNIT/ML (3 ML) SUBCUTANEOUS PEN
6 refills | 0 days | Status: CP
Start: 2022-02-25 — End: ?

## 2022-02-25 MED ORDER — ATORVASTATIN 20 MG TABLET
ORAL_TABLET | Freq: Every day | ORAL | 3 refills | 90.00000 days | Status: CP
Start: 2022-02-25 — End: 2022-02-25
  Filled 2022-02-25: qty 30, 30d supply, fill #0

## 2022-02-25 MED ORDER — GABAPENTIN 300 MG CAPSULE
ORAL_CAPSULE | Freq: Every evening | ORAL | 3 refills | 90 days | Status: CP
Start: 2022-02-25 — End: ?
  Filled 2022-02-25: qty 90, 90d supply, fill #0

## 2022-02-25 MED ORDER — PANTOPRAZOLE 40 MG TABLET,DELAYED RELEASE
ORAL_TABLET | Freq: Every day | ORAL | 11 refills | 30 days | Status: CP
Start: 2022-02-25 — End: ?
  Filled 2022-02-25: qty 30, 30d supply, fill #0

## 2022-02-25 MED ORDER — OXYCODONE 5 MG TABLET
ORAL_TABLET | Freq: Three times a day (TID) | ORAL | 0 refills | 4 days | Status: CP | PRN
Start: 2022-02-25 — End: 2022-03-02

## 2022-02-25 MED ORDER — INSULIN DEGLUDEC (U-100) 100 UNIT/ML (3 ML) SUBCUTANEOUS PEN
Freq: Every day | SUBCUTANEOUS | 11 refills | 125 days | Status: CP
Start: 2022-02-25 — End: ?

## 2022-02-25 NOTE — Unmapped (Signed)
Missed patient appt today, since he was seen much earlier than appt time. Discussed status with PA Czepiga who said the patient is to return  in 2 weeks for f/up and to evaluate potential staple removal. She stated he c/o dizziness today with blood draw, but improved. BP WNL. Patient continues to report jerking with sleep but noted improvement. Pharmacy adjusted his insulin, but not plan to switch to envarsus with improvement in jerking. Tac level resulted supratherapeutic. Per PharmD Chargualaf, will skip tonight's dose and begin taking 7mg  bid tomorrow. Contacted patient at evening and relayed dosage changes. Patient questioned multiple times why he was told by providers this morning,that all his labs were fine.Explained that they were operating on last week's results and that adjustments were made this afternoon on his most recent results. Patient said he took caps out of pill box during   phone call and agreed to mark new dosage   on med sheet. Also let him know that PA recommended he rtc in 2 weeks. He verbalized understanding.

## 2022-02-25 NOTE — Unmapped (Unsigned)
TRANSPLANT SURGERY PROGRESS NOTE    Assessment and Plan      Subjective  Matthew Key is a 61 y.o. male       Objective    Vitals:    02/25/22 0848   BP: 133/58   BP Site: R Arm   BP Position: Sitting   Pulse: 73   Temp: 36.5 ??C (97.7 ??F)   TempSrc: Tympanic   Weight: 88.6 kg (195 lb 4.8 oz)   Height: 172.7 cm (5' 7.99)      Body mass index is 29.7 kg/m??.     Physical Exam:    {EXAM;SUR Complete normal and system select:30417694}      Data Review:  All lab results last 24 hours:    Recent Results (from the past 24 hour(s))   Gamma GT    Collection Time: 02/25/22  8:20 AM   Result Value Ref Range    GGT 117 (H) 0 - 73 U/L   Magnesium Level    Collection Time: 02/25/22  8:20 AM   Result Value Ref Range    Magnesium 1.7 1.6 - 2.6 mg/dL   Phosphorus Level    Collection Time: 02/25/22  8:20 AM   Result Value Ref Range    Phosphorus 5.4 (H) 2.4 - 5.1 mg/dL   Bilirubin, Direct    Collection Time: 02/25/22  8:20 AM   Result Value Ref Range    Bilirubin, Direct 0.30 0.00 - 0.30 mg/dL   Comprehensive Metabolic Panel    Collection Time: 02/25/22  8:20 AM   Result Value Ref Range    Sodium 141 135 - 145 mmol/L    Potassium 5.0 (H) 3.4 - 4.8 mmol/L    Chloride 108 (H) 98 - 107 mmol/L    CO2 22.0 20.0 - 31.0 mmol/L    Anion Gap 11 5 - 14 mmol/L    BUN 31 (H) 9 - 23 mg/dL    Creatinine 4.54 (H) 0.60 - 1.10 mg/dL    BUN/Creatinine Ratio 14     eGFR CKD-EPI (2021) Male 33 (L) >=60 mL/min/1.13m2    Glucose 137 (H) 70 - 99 mg/dL    Calcium 9.6 8.7 - 09.8 mg/dL    Albumin 3.7 3.4 - 5.0 g/dL    Total Protein 6.5 5.7 - 8.2 g/dL    Total Bilirubin 0.7 0.3 - 1.2 mg/dL    AST 9 <=11 U/L    ALT 16 10 - 49 U/L    Alkaline Phosphatase 116 46 - 116 U/L   CBC w/ Differential    Collection Time: 02/25/22  8:20 AM   Result Value Ref Range    WBC 5.7 3.6 - 11.2 10*9/L    RBC 3.03 (L) 4.26 - 5.60 10*12/L    HGB 9.6 (L) 12.9 - 16.5 g/dL    HCT 91.4 (L) 78.2 - 48.0 %    MCV 89.7 77.6 - 95.7 fL    MCH 31.8 25.9 - 32.4 pg    MCHC 35.4 32.0 - 36.0 g/dL    RDW 95.6 (H) 21.3 - 15.2 %    MPV 8.1 6.8 - 10.7 fL    Platelet 318 150 - 450 10*9/L    Neutrophils % 74.6 %    Lymphocytes % 17.2 %    Monocytes % 4.9 %    Eosinophils % 2.1 %    Basophils % 1.2 %    Absolute Neutrophils 4.2 1.8 - 7.8 10*9/L    Absolute Lymphocytes 1.0 (L) 1.1 -  3.6 10*9/L    Absolute Monocytes 0.3 0.3 - 0.8 10*9/L    Absolute Eosinophils 0.1 0.0 - 0.5 10*9/L    Absolute Basophils 0.1 0.0 - 0.1 10*9/L    Anisocytosis Slight (A) Not Present         Imaging:

## 2022-02-25 NOTE — Unmapped (Unsigned)
Please see pharmacy note for documentation.

## 2022-02-25 NOTE — Unmapped (Signed)
TRANSPLANT SURGERY PROGRESS NOTE    Assessment and Plan  - Episode of dizziness post blood drawl this morning, patient didn't eat or drink this am , resolved after eating/drinking in office  - LFT stable WBC, Hgb, and  Plt stable    - c/o jerking , has improved slightly- may transition to Envarsus  - Ok to start Atorvastatin   - Insulin changes per pharmacy   - Reports appetite is good, drinking two Boost /day  - Had some weight loss, most likely fluid. Should continue to monitor weight and nutrition   - Denies any infectious symptoms, incision healing no drainage    - Bump in sCr , ? Dehydration  vs Tacrolimus , continue to monitor   - RTC in 1-2 week, follow up on nutrition/weight, social concerns and staple/suture removal       Subjective  Matthew Key is a 61 y.o. male       Objective    Vitals:    02/25/22 0848   BP: 133/58   BP Site: R Arm   BP Position: Sitting   Pulse: 73   Temp: 36.5 ??C (97.7 ??F)   TempSrc: Tympanic   Weight: 88.6 kg (195 lb 4.8 oz)   Height: 172.7 cm (5' 7.99)      Body mass index is 29.7 kg/m??.     Physical Exam:    {EXAM;SUR Complete normal and system select:[id number]}      Data Review:  All lab results last 24 hours:    Recent Results (from the past 24 hour(s))   TACROLIMUS LEVEL    Collection Time: 02/25/22  8:20 AM   Result Value Ref Range    Tacrolimus, Timed 13.7 ng/mL   Gamma GT    Collection Time: 02/25/22  8:20 AM   Result Value Ref Range    GGT 117 (H) 0 - 73 U/L   Magnesium Level    Collection Time: 02/25/22  8:20 AM   Result Value Ref Range    Magnesium 1.7 1.6 - 2.6 mg/dL   Phosphorus Level    Collection Time: 02/25/22  8:20 AM   Result Value Ref Range    Phosphorus 5.4 (H) 2.4 - 5.1 mg/dL   Bilirubin, Direct    Collection Time: 02/25/22  8:20 AM   Result Value Ref Range    Bilirubin, Direct 0.30 0.00 - 0.30 mg/dL   Comprehensive Metabolic Panel    Collection Time: 02/25/22  8:20 AM   Result Value Ref Range    Sodium 141 135 - 145 mmol/L    Potassium 5.0 (H) 3.4 - 4.8 mmol/L    Chloride 108 (H) 98 - 107 mmol/L    CO2 22.0 20.0 - 31.0 mmol/L    Anion Gap 11 5 - 14 mmol/L    BUN 31 (H) 9 - 23 mg/dL    Creatinine 1.61 (H) 0.60 - 1.10 mg/dL    BUN/Creatinine Ratio 14     eGFR CKD-EPI (2021) Male 33 (L) >=60 mL/min/1.49m2    Glucose 137 (H) 70 - 99 mg/dL    Calcium 9.6 8.7 - 09.6 mg/dL    Albumin 3.7 3.4 - 5.0 g/dL    Total Protein 6.5 5.7 - 8.2 g/dL    Total Bilirubin 0.7 0.3 - 1.2 mg/dL    AST 9 <=04 U/L    ALT 16 10 - 49 U/L    Alkaline Phosphatase 116 46 - 116 U/L   CBC w/ Differential    Collection Time:  02/25/22  8:20 AM   Result Value Ref Range    WBC 5.7 3.6 - 11.2 10*9/L    RBC 3.03 (L) 4.26 - 5.60 10*12/L    HGB 9.6 (L) 12.9 - 16.5 g/dL    HCT 29.5 (L) 62.1 - 48.0 %    MCV 89.7 77.6 - 95.7 fL    MCH 31.8 25.9 - 32.4 pg    MCHC 35.4 32.0 - 36.0 g/dL    RDW 30.8 (H) 65.7 - 15.2 %    MPV 8.1 6.8 - 10.7 fL    Platelet 318 150 - 450 10*9/L    Neutrophils % 74.6 %    Lymphocytes % 17.2 %    Monocytes % 4.9 %    Eosinophils % 2.1 %    Basophils % 1.2 %    Absolute Neutrophils 4.2 1.8 - 7.8 10*9/L    Absolute Lymphocytes 1.0 (L) 1.1 - 3.6 10*9/L    Absolute Monocytes 0.3 0.3 - 0.8 10*9/L    Absolute Eosinophils 0.1 0.0 - 0.5 10*9/L    Absolute Basophils 0.1 0.0 - 0.1 10*9/L    Anisocytosis Slight (A) Not Present         Imaging: g/dL    HCT 84.6 (L) 96.2 - 48.0 %    MCV 89.7 77.6 - 95.7 fL    MCH 31.8 25.9 - 32.4 pg    MCHC 35.4 32.0 - 36.0 g/dL    RDW 95.2 (H) 84.1 - 15.2 %    MPV 8.1 6.8 - 10.7 fL    Platelet 318 150 - 450 10*9/L    Neutrophils % 74.6 %    Lymphocytes % 17.2 %    Monocytes % 4.9 %    Eosinophils % 2.1 %    Basophils % 1.2 %    Absolute Neutrophils 4.2 1.8 - 7.8 10*9/L    Absolute Lymphocytes 1.0 (L) 1.1 - 3.6 10*9/L    Absolute Monocytes 0.3 0.3 - 0.8 10*9/L    Absolute Eosinophils 0.1 0.0 - 0.5 10*9/L    Absolute Basophils 0.1 0.0 - 0.1 10*9/L    Anisocytosis Slight (A) Not Present

## 2022-02-26 DIAGNOSIS — Z944 Liver transplant status: Principal | ICD-10-CM

## 2022-02-26 DIAGNOSIS — Z796 Long-term use of immunosuppressant medication: Principal | ICD-10-CM

## 2022-02-26 LAB — TACROLIMUS LEVEL, TROUGH: TACROLIMUS, TROUGH: 9.4 ng/mL (ref 2.0–20.0)

## 2022-02-26 MED ORDER — TACROLIMUS 1 MG CAPSULE, IMMEDIATE-RELEASE
ORAL_CAPSULE | Freq: Two times a day (BID) | ORAL | 11 refills | 30 days | Status: CP
Start: 2022-02-26 — End: 2023-02-26

## 2022-02-26 NOTE — Unmapped (Signed)
error 

## 2022-02-27 ENCOUNTER — Ambulatory Visit: Admit: 2022-02-27 | Discharge: 2022-02-27 | Disposition: A | Discharge status: Home / Self Care | Payer: MEDICARE

## 2022-02-27 ENCOUNTER — Emergency Department: Admit: 2022-02-27 | Discharge: 2022-02-27 | Disposition: A | Discharge status: Home / Self Care | Payer: MEDICARE

## 2022-02-27 LAB — COMPREHENSIVE METABOLIC PANEL
ALBUMIN: 3.4 g/dL (ref 3.4–5.0)
ALKALINE PHOSPHATASE: 91 U/L (ref 46–116)
ALT (SGPT): 12 U/L (ref 10–49)
ANION GAP: 9 mmol/L (ref 5–14)
AST (SGOT): 27 U/L (ref <=34)
BILIRUBIN TOTAL: 0.5 mg/dL (ref 0.3–1.2)
BLOOD UREA NITROGEN: 27 mg/dL — ABNORMAL HIGH (ref 9–23)
BUN / CREAT RATIO: 13
CALCIUM: 8.7 mg/dL (ref 8.7–10.4)
CHLORIDE: 111 mmol/L — ABNORMAL HIGH (ref 98–107)
CO2: 22 mmol/L (ref 20.0–31.0)
CREATININE: 2.01 mg/dL — ABNORMAL HIGH
EGFR CKD-EPI (2021) MALE: 37 mL/min/{1.73_m2} — ABNORMAL LOW (ref >=60)
GLUCOSE RANDOM: 96 mg/dL (ref 70–179)
POTASSIUM: 5.4 mmol/L — ABNORMAL HIGH (ref 3.4–4.8)
PROTEIN TOTAL: 5.8 g/dL (ref 5.7–8.2)
SODIUM: 142 mmol/L (ref 135–145)

## 2022-02-27 LAB — PROTIME-INR
INR: 1.02
PROTIME: 11.5 s (ref 9.8–12.8)

## 2022-02-27 LAB — CBC W/ AUTO DIFF
BASOPHILS ABSOLUTE COUNT: 0 10*9/L (ref 0.0–0.1)
BASOPHILS RELATIVE PERCENT: 0.2 %
EOSINOPHILS ABSOLUTE COUNT: 0.1 10*9/L (ref 0.0–0.5)
EOSINOPHILS RELATIVE PERCENT: 2.4 %
HEMATOCRIT: 22.8 % — ABNORMAL LOW (ref 39.0–48.0)
HEMOGLOBIN: 7.9 g/dL — ABNORMAL LOW (ref 12.9–16.5)
LYMPHOCYTES ABSOLUTE COUNT: 0.8 10*9/L — ABNORMAL LOW (ref 1.1–3.6)
LYMPHOCYTES RELATIVE PERCENT: 18.4 %
MEAN CORPUSCULAR HEMOGLOBIN CONC: 34.5 g/dL (ref 32.0–36.0)
MEAN CORPUSCULAR HEMOGLOBIN: 30.7 pg (ref 25.9–32.4)
MEAN CORPUSCULAR VOLUME: 89.1 fL (ref 77.6–95.7)
MEAN PLATELET VOLUME: 8.6 fL (ref 6.8–10.7)
MONOCYTES ABSOLUTE COUNT: 0.3 10*9/L (ref 0.3–0.8)
MONOCYTES RELATIVE PERCENT: 6.2 %
NEUTROPHILS ABSOLUTE COUNT: 3 10*9/L (ref 1.8–7.8)
NEUTROPHILS RELATIVE PERCENT: 72.8 %
PLATELET COUNT: 258 10*9/L (ref 150–450)
RED BLOOD CELL COUNT: 2.56 10*12/L — ABNORMAL LOW (ref 4.26–5.60)
RED CELL DISTRIBUTION WIDTH: 16.2 % — ABNORMAL HIGH (ref 12.2–15.2)
WBC ADJUSTED: 4.2 10*9/L (ref 3.6–11.2)

## 2022-02-27 LAB — APTT
APTT: 30.6 s (ref 25.1–36.5)
HEPARIN CORRELATION: 0.2

## 2022-02-27 MED ADMIN — oxyCODONE (ROXICODONE) immediate release tablet 5 mg: 5 mg | ORAL | @ 13:00:00 | Stop: 2022-02-27

## 2022-02-27 MED ADMIN — tacrolimus (PROGRAF) capsule 7 mg: 7 mg | ORAL | @ 15:00:00 | Stop: 2022-02-27

## 2022-02-27 MED ADMIN — mycophenolate (CELLCEPT) capsule 1,000 mg: 1000 mg | ORAL | @ 15:00:00 | Stop: 2022-02-27

## 2022-02-27 NOTE — Unmapped (Signed)
Received page from patient. Tried calling him a few times and went to VM.   Called and was talking with him and then the phone call dropped and tried calling back and busy signal.   Was able to talk with him. He mentioned just prior to txp that he had discomfort/numbness in his arms and a scan was done prior to transplant; had hx of clot in arms and thought stent was needed but did not proceed with stent. Saw May 2022 PVLs when off the phone.     After transplant he had numbness that resolved and this pain is different. Denies swelling, redness in right arm; discomfort and numbness from right shoulder to fingers. Had lab draw yesterday from right hand; gave insulin yesterday in right arm. Reports no difference in color of both of his arms.   Discussed not using his right arm for insulin and to rest his right arm.   After seeing PVLs in May 2022, noted the Evidence of acute obstruction is visualized in the basilic vein from Dec 17, 2020 in right arm. Left arm also showed acute obstruction but patient only reporting pain in right and says it is a different pain than before.  Patient confirmed when the obstructions/clots found before transplant that he did not have swelling or redness either.   Called Dr. Celine Mans letting him know that patient is 20 days out, experiencing pain in right shoulder to his hand that is different than he has experienced before that started this morning; past PVL of upper extremity has shown Evidence of acute obstruction is visualized in the basilic vein. Mentioned he denies swelling or redness, same color in both arms when compared but also reports he had no swelling/redness/symptoms when had previous obstruction/clot.   Per Dr. Celine Mans, with patient being only 20 days out, acute pain and history of obstructions that patient needs to go to the ED to be assessed for clot. He also confirmed for patient to come to [facility] ED to be assessed versus locally in case vascular is needed.   Talked with patient who verbalized understanding the need to come to [facility] ED to be assessed tonight; said he would come around 9:15pm tonight. Called ED charge nurse about patient coming between 9:15-9:30pm to be evaluated for clot due to 20 days out from txp surgery, right arm pain from shoulder to hand and hx of clot from past PVL. ED charge nurse mentioned PVLs not currently available tonight and wanted [place] to be aware; mentioned that Dr. Celine Mans would still want patient to be assessed.   Sent message to Dr. Celine Mans that PVLs not available and he confirmed that he does not only need a PVL but needs to be evaluated by appropriate specialists such as neuro or vascular.   Talked with patient letting him know that ED charge nurse aware he is coming and mentioned PVLs not available tonight but Dr. Celine Mans was made aware and still wants him evaluated in case neuro or vascular needs to be involved. Patient verbalized understanding.   Called ED charge nurse back letting him now that Dr. Celine Mans aware no PVLs tonight and Dr. Celine Mans confirmed he does not only need PVLs but to be evaluated in case neuro or vascular needed.

## 2022-02-27 NOTE — Unmapped (Signed)
Patient reports having Liver transplant 20 days ago. Started having right arm pain this morning.

## 2022-02-27 NOTE — Unmapped (Signed)
Numbness in right forearm that started today with pain in hand, has a hx of blood clot in bilateral shoulders. Was told by liver transplant to come to ED for further eval.

## 2022-02-27 NOTE — Unmapped (Signed)
Called pt post ED visit to see how he is doing. Pt reports doing much better with significant improvement of right arm pain. No thrombus was found on PVL at ED visit.

## 2022-02-27 NOTE — Unmapped (Signed)
ED Procedure Note    Critical Care  Performed by: Penni Bombard, MD  Authorized by: Penni Bombard, MD     Critical care provider statement:     Critical care time (minutes):  31    Critical care time was exclusive of:  Separately billable procedures and treating other patients and teaching time    Critical care was necessary to treat or prevent imminent or life-threatening deterioration of the following conditions:  Metabolic crisis    Critical care was time spent personally by me on the following activities:  Development of treatment plan with patient or surrogate, discussions with primary provider, evaluation of patient's response to treatment, examination of patient, ordering and review of laboratory studies, ordering and performing treatments and interventions, ordering and review of radiographic studies, pulse oximetry, re-evaluation of patient's condition, review of old charts and discussions with consultants    I assumed direction of critical care for this patient from another provider in my specialty: no

## 2022-02-27 NOTE — Unmapped (Signed)
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  Patient states his pain started when he was at rest.  No recent trauma to the right arm or falls.  He reports he has a prior history of DVT to both upper extremities years ago.  He is not currently anticoagulated.  Patient called his transplant coordinator's office who referred him to the emergency department for further evaluation.  He is concerned he could have a blood clot in his right upper extremity today.    On exam: He is quite well-appearing in no acute distress. Vital signs stable.  Regular rate and rhythm without murmurs, gallops, rubs.  2+ radial pulses bilaterally.  Right arm does not appear swollen nor tender throughout the deltoid, humerus, forearm, wrist, hand.  LUE does not appear swollen nor tender.  He complains of paresthesias to the right arm at the level of the forearm. Abdomen soft with healing post surgical transplant wound - staples in place. Abdominal wound clean,dry, intact without drainage or erythema.     Differential diagnosis includes DVT versus superficial venous thrombosis versus musculoskeletal pain versus peripheral neuropathy amongst others.  Also considering ACS, however feel this is less likely considering no chest pain. Still, given arm pain in an older gentleman we will check EKG in addition to basic labs.  We will also order PVL duplex of right upper extremity to rule out DVT today.      BP 112/51  - Pulse 62  - Temp 36.9 ??C (98.4 ??F) (Oral)  - Resp 18  - SpO2 98%       Discussion of Management with other Physicians, QHP, or Appropriate Source:   None      Independent Interpretation of Studies:   EKG(s) - normal sinus rhythm, rate 60, normal axis, normal intervals, no ST elevation or depression.      External Records Reviewed:   -Reviewed 02/26/2022 [facility] liver transplant telephone note.      Additional Progress Notes    0700: Patient care signed out to oncoming attending physician pending PVL of the upper extremities and final disposition.  Patient's renal function is stable  with Cr today at 2.01.  No leukocytosis.  EKG nonischemic. Liver US was also ordered in setting of pt's recent transplant to ensure patency of transplant vasculature.          Portions of this record have been created using Scientist, clinical (histocompatibility and immunogenetics). Dictation errors have been sought, but may not have been identified and corrected.      The case was discussed with the attending physician who is in agreement with the above assessment and plan.     ____________________________________________         History        Reason for Visit  Post-op Problem and Numbness  HPI   Matthew Key is a 61 y.o. 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  Patient states his pain started when he was at rest.  No recent trauma to the right arm or falls.  He reports he has a prior history of DVT to both upper extremities years ago.  He is not currently anticoagulated.  Patient called his transplant coordinator's office who referred him to the emergency department for further evaluation.  He is concerned he could have a blood clot in his right upper extremity today.    Outside Historian(s)  (EMS, Significant Other, Family, Parent, Caregiver, Friend, Law Enforcement, etc.)    N/A      Past Medical History:   Diagnosis Date    AKI (acute kidney injury) (CMS-HCC) 12/14/2020    Anxiety 10/22/2013    Arthritis     Cervical radiculopathy 12/03/2016    Chronic pain disorder     Lower back    Cirrhosis (CMS-HCC)     Dental abscess 10/2020    Duodenal ulcer 12/01/2017    GERD (gastroesophageal reflux disease)     History of transfusion     Hyperlipidemia 10/22/2013    Hypertension     under control with meds and weight loss    Hypotension 01/29/2022    Liver disease     Sleep apnea, obstructive     Have machine    Stroke (CMS-HCC)     mild stroke    Type 2 diabetes mellitus with diabetic neuropathy, with long-term current use of insulin (CMS-HCC) 06/09/2014       Patient Active Problem List   Diagnosis    Hyponatremia    Leukocytosis    Non-alcoholic micronodular cirrhosis of liver (CMS-HCC)    Pleural effusion, right    Primary insomnia    OSA on CPAP    Thrombocytopenia (CMS-HCC)    Type 2 diabetes mellitus without complication (CMS-HCC)    Spontaneous bacterial peritonitis (CMS-HCC)    Anemia    Left upper extremity swelling    Adrenal insufficiency (CMS-HCC)    Acute deep vein thrombosis (DVT) of brachial vein of left upper extremity (CMS-HCC)    Acute thrombosis of right basilic vein    Hypofibrinogenemia (CMS-HCC)    Abdominal ascites    Pleural effusion associated with hepatic disorder    Essential hypertension    Acute medial meniscus tear of right knee    AKI (acute kidney injury) (CMS-HCC)    Anxiety    Cerebrovascular accident (CVA) (CMS-HCC)    Cervical radicular pain    Cervical radiculopathy    Chronic bilateral low back pain without sciatica    Neck pain, chronic    COVID-19    De Quervain's tenosynovitis, left    Duodenal ulcer    Elevated BUN Elevated liver enzymes    Erectile dysfunction    Esophageal varices determined by endoscopy (CMS-HCC)    Fatty liver    Gastroesophageal reflux disease    History of stroke without residual deficits    Hospital discharge follow-up    Hyperlipidemia    Lactic acidosis    Obesity (BMI 30-39.9)    Right shoulder pain    Right upper quadrant abdominal pain    Tubular adenoma of colon  Water retention    Hypertension associated with diabetes (CMS-HCC)    Acute metabolic encephalopathy    Decompensated hepatic cirrhosis (CMS-HCC)    Hyperkalemia    Hypotension    Liver transplant status (CMS-HCC)       Past Surgical History:   Procedure Laterality Date    KNEE SURGERY      PR CATH PLACE/CORON ANGIO, IMG SUPER/INTERP,R&L HRT CATH, L HRT VENTRIC N/A 12/19/2020    Procedure: CATH LEFT/RIGHT HEART CATHETERIZATION;  Surgeon: Rosana Hoes, MD;  Location: [facility] CATH;  Service: Cardiology    PR COLONOSCOPY FLX DX W/COLLJ SPEC WHEN PFRMD N/A 12/07/2021    Procedure: COLONOSCOPY, FLEXIBLE, PROXIMAL TO SPLENIC FLEXURE; DIAGNOSTIC, W/WO COLLECTION SPECIMEN BY BRUSH OR WASH;  Surgeon: Annie Paras, MD;  Location: GI [facility]URES [facility];  Service: Gastroenterology    PR TRANSPLANT LIVER,ALLOTRANSPLANT N/A 02/06/2022    Procedure: LIVER ALLOTRANSPLANTATION; ORTHOTOPIC, PARTIAL OR WHOLE, FROM CADAVER OR LIVING DONOR, ANY AGE;  Surgeon: Florene Glen, MD;  Location: MAIN OR [place];  Service: Transplant    PR TRANSPLANT,PREP DONOR LIVER/ARTERIAL N/A 02/06/2022    Procedure: BACKBNCH RECONSTRUCT OF CAD/LIVE DONOR LIVER GFT PRIOR TRANSPLANT; ARTERIAL ANASTAMOSIS, EA;  Surgeon: Florene Glen, MD;  Location: MAIN OR [place];  Service: Transplant    PR UPPER GI ENDOSCOPY,BIOPSY N/A 10/12/2020    Procedure: UGI ENDOSCOPY; WITH BIOPSY, SINGLE OR MULTIPLE;  Surgeon: Marene Lenz, MD;  Location: GI [facility]URES [facility];  Service: Gastroenterology    PR UPPER GI ENDOSCOPY,DIAGNOSIS N/A 01/03/2022 Procedure: UGI ENDO, INCLUDE ESOPHAGUS, STOMACH, & DUODENUM &/OR JEJUNUM; DX W/WO COLLECTION SPECIMN, BY BRUSH OR WASH;  Surgeon: Marene Lenz, MD;  Location: GI [facility];  Service: Gastroenterology    PR UPPER GI ENDOSCOPY,LIGAT VARIX N/A 12/07/2021    Procedure: UGI ENDO; Everlene Balls LIG ESOPH &/OR GASTRIC VARICES;  Surgeon: Annie Paras, MD;  Location: GI [facility]URES [facility];  Service: Gastroenterology    ROOT CANAL      Front teeth       No current facility-administered medications for this encounter.    Current Outpatient Medications:     acetaminophen (TYLENOL) 325 MG tablet, Take 1-2 tablets (325-650 mg total) by mouth every six (6) hours as needed for pain or fever., Disp: 100 tablet, Rfl: 11    aspirin (ECOTRIN) 81 MG tablet, Take 1 tablet (81 mg total) by mouth daily., Disp: 90 tablet, Rfl: 3    atorvastatin (LIPITOR) 20 MG tablet, Take 1 tablet (20 mg total) by mouth daily., Disp: 30 tablet, Rfl: 2    blood sugar diagnostic (ACCU-CHEK GUIDE TEST STRIPS) Strp, Use to check blood sugar as directed with insulin 3 times a day & for symptoms of high or low blood sugar, Disp: 100 strip, Rfl: 11    blood-glucose meter Misc, Use to test blood sugar as directed, Disp: 1 each, Rfl: 0    docusate sodium (COLACE) 100 MG capsule, Take 1 capsule (100 mg total) by mouth two (2) times a day as needed for constipation., Disp: 60 capsule, Rfl: 11    gabapentin (NEURONTIN) 300 MG capsule, Take 1 capsule (300 mg total) by mouth at bedtime., Disp: 90 capsule, Rfl: 3    glucagon 3 mg/actuation Spry, Use 1 spray into the left nostril every fifteen (15) minutes as needed (low blood sugar)., Disp: 2 each, Rfl: 1    hydrOXYzine (VISTARIL) 50 MG capsule, Take 1 capsule (50 mg total) by mouth nightly as needed for anxiety.,  Disp: 30 capsule, Rfl: 3    insulin ASPART (NOVOLOG FLEXPEN) 100 unit/mL (3 mL) injection pen, Inject under the skin per sliding scale prior to meals. If premeal BG 151-200 take 2 additional units for correction, if 201-250 take 4 additional units, etc. Max dose 36 units per day, Disp: 15 mL, Rfl: 6    insulin degludec (TRESIBA FLEXTOUCH U-100) 100 unit/mL (3 mL) InPn, Inject 0.12 mL (12 Units total) under the skin daily. Adjust as instructed., Disp: 15 mL, Rfl: 11    lancets Misc, Use to check blood sugar as directed with insulin 3 times a day & for symptoms of high or low blood sugar., Disp: 100 each, Rfl: 0    magnesium oxide-Mg AA chelate (MAGNESIUM, AMINO ACID CHELATE,) 133 mg, Take 1 tablet by mouth two (2) times a day. Hold until directed to take by nurse coordinator, Disp: 100 tablet, Rfl: 3    melatonin 3 mg Tab, Take 1 tablet (3 mg total) by mouth every evening., Disp: 30 tablet, Rfl: 11    methocarbamoL (ROBAXIN) 500 MG tablet, Take 1 tablet (500 mg total) by mouth Three (3) times a day as needed., Disp: 90 tablet, Rfl: 0    mycophenolate (CELLCEPT) 250 mg capsule, Take 4 capsules (1,000 mg total) by mouth Two (2) times a day., Disp: 240 capsule, Rfl: 11    oxyCODONE (ROXICODONE) 5 MG immediate release tablet, Take 1 tablet (5 mg total) by mouth every eight (8) hours as needed for pain for up to 5 days., Disp: 10 tablet, Rfl: 0    pantoprazole (PROTONIX) 40 MG tablet, Take 1 tablet (40 mg total) by mouth daily., Disp: 30 tablet, Rfl: 11    pen needle, diabetic (BD ULTRA-FINE NANO PEN NEEDLE) 32 gauge x 5/32 (4 mm) Ndle, Use as directed  1-2 times per day, Disp: 100 each, Rfl: 12    predniSONE (DELTASONE) 2.5 MG tablet, Take 1 tablet (2.5 mg total) by mouth daily., Disp: 7 tablet, Rfl: 0    sulfamethoxazole-trimethoprim (BACTRIM) 400-80 mg per tablet, Take 1 tablet (80 mg of trimethoprim total) by mouth 3 (three) times a week., Disp: 12 tablet, Rfl: 4    tacrolimus (PROGRAF) 1 MG capsule, Take 7 capsules (7 mg total) by mouth two (2) times a day., Disp: 420 capsule, Rfl: 11    tamsulosin (FLOMAX) 0.4 mg capsule, Take 1 capsule (0.4 mg total) by mouth nightly., Disp: 30 capsule, Rfl: 3    valGANciclovir (VALCYTE) 450 mg tablet, Take 1 tablet (450 mg total) by mouth daily., Disp: 30 tablet, Rfl: 2    Allergies  Venom-honey bee    Family History   Problem Relation Age of Onset    Edema Mother     Alzheimer's disease Father     Aortic dissection Brother     Early death Brother     Aneurysm Brother        Social History  Social History     Tobacco Use    Smoking status: Never    Smokeless tobacco: Never   Vaping Use    Vaping Use: Never used   Substance Use Topics    Alcohol use: Not Currently    Drug use: Not Currently          Physical Exam     ED Triage Vitals   Enc Vitals Group      BP 02/26/22 2209 139/69      Heart Rate 02/26/22 2157 66      SpO2  Pulse 02/27/22 0138 65      Resp 02/26/22 2209 20      Temp 02/26/22 2209 37.2 ??C (99 ??F)      Temp Source 02/26/22 2209 Oral      SpO2 02/26/22 2157 99 %      Weight --       Height --       Head Circumference --       Peak Flow --       Pain Score --       Pain Loc --       Pain Edu? --       Excl. in GC? --      Constitutional: Quite well appearing, no acute distress.   HEENT: [place]/AT. EOMI. Normal conjunctiva. MMM. No stridor.  Cardiovascular: RRR without m/g/r. 2+ radial pulses bilaterally.   Respiratory: Normal respiratory effort. Lungs CTAB.   Gastrointestinal: Abdomen soft with healing post surgical transplant wound - staples in place. Abdominal wound clean,dry, intact without drainage or erythema. No abdominal tenderness or distension.   Musculoskeletal: Right arm does not appear swollen nor tender throughout the deltoid, humerus, forearm, wrist, hand.  LUE does not appear swollen nor tender.  He complains of paresthesias to the right arm at the level of the forearm.  Neurologic: Awake, alert. MAES. No focal neurologic deficits are appreciated.  Skin: Skin is warm, dry. No rash noted.  Psychiatric: Mood and affect are normal. Speech and behavior are normal.        Labs     Results for orders placed or performed during the hospital encounter of 02/27/22   Comprehensive metabolic panel   Result Value Ref Range    Sodium 142 135 - 145 mmol/L    Potassium 5.4 (H) 3.4 - 4.8 mmol/L    Chloride 111 (H) 98 - 107 mmol/L    CO2 22.0 20.0 - 31.0 mmol/L    Anion Gap 9 5 - 14 mmol/L    BUN 27 (H) 9 - 23 mg/dL    Creatinine 1.30 (H) 0.60 - 1.10 mg/dL    BUN/Creatinine Ratio 13     eGFR CKD-EPI (2021) Male 37 (L) >=60 mL/min/1.77m2    Glucose 96 70 - 179 mg/dL    Calcium 8.7 8.7 - 86.5 mg/dL    Albumin 3.4 3.4 - 5.0 g/dL    Total Protein 5.8 5.7 - 8.2 g/dL    Total Bilirubin 0.5 0.3 - 1.2 mg/dL    AST 27 <=78 U/L    ALT 12 10 - 49 U/L    Alkaline Phosphatase 91 46 - 116 U/L   Protime-INR   Result Value Ref Range    PT 11.5 9.8 - 12.8 sec    INR 1.02    APTT   Result Value Ref Range    APTT 30.6 25.1 - 36.5 sec    Heparin Correlation 0.2    ECG 12 Lead   Result Value Ref Range    EKG Systolic BP  mmHg    EKG Diastolic BP  mmHg    EKG Ventricular Rate 60 BPM    EKG Atrial Rate 60 BPM    EKG P-R Interval 186 ms    EKG QRS Duration 80 ms    EKG Q-T Interval 398 ms    EKG QTC Calculation 398 ms    EKG Calculated P Axis 41 degrees    EKG Calculated R Axis 40 degrees    EKG Calculated T Axis 18 degrees  QTC Fredericia 398 ms   POCT Glucose   Result Value Ref Range    Glucose, POC 118 70 - 179 mg/dL   CBC w/ Differential   Result Value Ref Range    WBC 4.2 3.6 - 11.2 10*9/L    RBC 2.56 (L) 4.26 - 5.60 10*12/L    HGB 7.9 (L) 12.9 - 16.5 g/dL    HCT 81.1 (L) 91.4 - 48.0 %    MCV 89.1 77.6 - 95.7 fL    MCH 30.7 25.9 - 32.4 pg    MCHC 34.5 32.0 - 36.0 g/dL    RDW 78.2 (H) 95.6 - 15.2 %    MPV 8.6 6.8 - 10.7 fL    Platelet 258 150 - 450 10*9/L    Neutrophils % 72.8 %    Lymphocytes % 18.4 %    Monocytes % 6.2 %    Eosinophils % 2.4 %    Basophils % 0.2 %    Absolute Neutrophils 3.0 1.8 - 7.8 10*9/L    Absolute Lymphocytes 0.8 (L) 1.1 - 3.6 10*9/L    Absolute Monocytes 0.3 0.3 - 0.8 10*9/L    Absolute Eosinophils 0.1 0.0 - 0.5 10*9/L    Absolute Basophils 0.0 0.0 - 0.1 10*9/L    Anisocytosis Slight (A) Not Present        Radiology     No orders to display                Dustin Folks Vernice Bowker, MD  Resident  02/27/22 [phone]

## 2022-02-27 NOTE — Unmapped (Signed)
Bed: HALL-01A  Expected date:   Expected time:   Means of arrival:   Comments:

## 2022-02-28 DIAGNOSIS — Z944 Liver transplant status: Principal | ICD-10-CM

## 2022-02-28 DIAGNOSIS — Z796 Long-term use of immunosuppressant medication: Principal | ICD-10-CM

## 2022-03-04 DIAGNOSIS — Z944 Liver transplant status: Principal | ICD-10-CM

## 2022-03-04 DIAGNOSIS — Z796 Long-term use of immunosuppressant medication: Principal | ICD-10-CM

## 2022-03-05 DIAGNOSIS — Z796 Long-term use of immunosuppressant medication: Principal | ICD-10-CM

## 2022-03-05 DIAGNOSIS — Z944 Liver transplant status: Principal | ICD-10-CM

## 2022-03-05 NOTE — Unmapped (Signed)
Received call this morning from [facility] RN, [place], who said she had attempted 3x to draw patient's blood this morning, but was unsuccessful. Contacted patient and request he go to [facility] tomorrow morning for lab work. He said he would contact someone for a ride and notify this T[facility] if unable to secure transportation there tomorrow.

## 2022-03-06 LAB — CBC W/ DIFFERENTIAL
BANDED NEUTROPHILS ABSOLUTE COUNT: 0 10*3/uL (ref 0.0–0.1)
BASOPHILS ABSOLUTE COUNT: 0.1 10*3/uL (ref 0.0–0.2)
BASOPHILS RELATIVE PERCENT: 1 %
EOSINOPHILS ABSOLUTE COUNT: 0.2 10*3/uL (ref 0.0–0.4)
EOSINOPHILS RELATIVE PERCENT: 4 %
HEMATOCRIT: 27.9 % — ABNORMAL LOW (ref 37.5–51.0)
HEMOGLOBIN: 9 g/dL — ABNORMAL LOW (ref 13.0–17.7)
IMMATURE GRANULOCYTES: 1 %
LYMPHOCYTES ABSOLUTE COUNT: 1.1 10*3/uL (ref 0.7–3.1)
LYMPHOCYTES RELATIVE PERCENT: 23 %
MEAN CORPUSCULAR HEMOGLOBIN CONC: 32.3 g/dL (ref 31.5–35.7)
MEAN CORPUSCULAR HEMOGLOBIN: 29.4 pg (ref 26.6–33.0)
MEAN CORPUSCULAR VOLUME: 91 fL (ref 79–97)
MONOCYTES ABSOLUTE COUNT: 0.3 10*3/uL (ref 0.1–0.9)
MONOCYTES RELATIVE PERCENT: 7 %
NEUTROPHILS ABSOLUTE COUNT: 3.2 10*3/uL (ref 1.4–7.0)
NEUTROPHILS RELATIVE PERCENT: 64 %
PLATELET COUNT: 266 10*3/uL (ref 150–450)
RED BLOOD CELL COUNT: 3.06 x10E6/uL — ABNORMAL LOW (ref 4.14–5.80)
RED CELL DISTRIBUTION WIDTH: 14.6 % (ref 11.6–15.4)
WHITE BLOOD CELL COUNT: 5 10*3/uL (ref 3.4–10.8)

## 2022-03-06 LAB — COMPREHENSIVE METABOLIC PANEL
A/G RATIO: 1.9 (ref 1.2–2.2)
ALBUMIN: 4.2 g/dL (ref 3.9–4.9)
ALKALINE PHOSPHATASE: 134 IU/L — ABNORMAL HIGH (ref 44–121)
ALT (SGPT): 18 IU/L (ref 0–44)
AST (SGOT): 12 IU/L (ref 0–40)
BILIRUBIN TOTAL (MG/DL) IN SER/PLAS: 0.6 mg/dL (ref 0.0–1.2)
BLOOD UREA NITROGEN: 40 mg/dL — ABNORMAL HIGH (ref 8–27)
BUN / CREAT RATIO: 16 (ref 10–24)
CALCIUM: 9.9 mg/dL (ref 8.6–10.2)
CHLORIDE: 103 mmol/L (ref 96–106)
CO2: 20 mmol/L (ref 20–29)
CREATININE: 2.53 mg/dL — ABNORMAL HIGH (ref 0.76–1.27)
EGFR: 28 mL/min/{1.73_m2} — ABNORMAL LOW
GLOBULIN, TOTAL: 2.2 g/dL (ref 1.5–4.5)
GLUCOSE: 180 mg/dL — ABNORMAL HIGH (ref 70–99)
POTASSIUM: 5.5 mmol/L — ABNORMAL HIGH (ref 3.5–5.2)
SODIUM: 139 mmol/L (ref 134–144)
TOTAL PROTEIN: 6.4 g/dL (ref 6.0–8.5)

## 2022-03-06 LAB — PHOSPHORUS: PHOSPHORUS, SERUM: 5.3 mg/dL — ABNORMAL HIGH (ref 2.8–4.1)

## 2022-03-06 LAB — MAGNESIUM: MAGNESIUM: 1.8 mg/dL (ref 1.6–2.3)

## 2022-03-06 LAB — GAMMA GT: GAMMA GLUTAMYL TRANSFERASE: 87 IU/L — ABNORMAL HIGH (ref 0–65)

## 2022-03-06 LAB — BILIRUBIN, DIRECT: BILIRUBIN DIRECT: 0.26 mg/dL (ref 0.00–0.40)

## 2022-03-06 NOTE — Unmapped (Signed)
[facility] Specialty Medication Onboarding    Specialty Medication: Mycophenolate 250mg  capsule  Prior Authorization: Not Required   Financial Assistance: No - copay  <$25  Final Copay/Day Supply: $0 / 30 days    Insurance Restrictions: None     Notes to Pharmacist: N/A    The triage team has completed the benefits investigation and has determined that the patient is able to fill this medication at [facility]. Please contact the patient to complete the onboarding or follow up with the prescribing physician as needed.

## 2022-03-06 NOTE — Unmapped (Addendum)
Patient completed labs at [facility] yesterday, which show elevation in Cr and K. Tac level still pending. Spoke with patient and provided education on dietary modifications to reduce serum K. He reports he is hydrating with 2 boosts daily and three to four 9 oz bottles of water daily. Reinforced the goal of 80-100 oz of caffeine-free fluids daily. He verbalized understanding. Labs were successfully drawn again today by his [facility] agency.

## 2022-03-07 DIAGNOSIS — Z796 Long-term use of immunosuppressant medication: Principal | ICD-10-CM

## 2022-03-07 DIAGNOSIS — Z944 Liver transplant status: Principal | ICD-10-CM

## 2022-03-07 LAB — RENAL FUNCTION PANEL
BLOOD UREA NITROGEN: 37 mg/dL — ABNORMAL HIGH (ref 8–27)
BUN / CREAT RATIO: 15 (ref 10–24)
CREATININE: 2.47 mg/dL — ABNORMAL HIGH (ref 0.76–1.27)
EGFR MDRD AF AMER: 29 mL/min/{1.73_m2} — ABNORMAL LOW
GLUCOSE RANDOM: 121 mg/dL — ABNORMAL HIGH (ref 70–99)
POTASSIUM: 5.5 mmol/L — ABNORMAL HIGH (ref 3.5–5.2)
SODIUM: 139 mmol/L (ref 134–144)

## 2022-03-07 LAB — CBC W/ DIFFERENTIAL
BASOPHILS ABSOLUTE COUNT: 0 10*3/uL (ref 0.0–0.2)
BASOPHILS RELATIVE PERCENT: 1 %
EOSINOPHILS ABSOLUTE COUNT: 0.2 10*3/uL (ref 0.0–0.4)
EOSINOPHILS RELATIVE PERCENT: 6 %
HEMATOCRIT: 25.3 % — ABNORMAL LOW (ref 37.5–51.0)
HEMOGLOBIN: 8.1 g/dL — ABNORMAL LOW (ref 13.0–17.7)
IMMATURE CELLS: 1 %
LYMPHOCYTES ABSOLUTE COUNT: 0.9 10*3/uL (ref 0.7–3.1)
LYMPHOCYTES RELATIVE PERCENT: 25 %
MEAN CORPUSCULAR HEMOGLOBIN CONC: 32 g/dL (ref 31.5–35.7)
MEAN CORPUSCULAR HEMOGLOBIN: 29.2 pg (ref 26.6–33.0)
MEAN CORPUSCULAR VOLUME: 91 fL (ref 79–97)
MONOCYTES ABSOLUTE COUNT: 0.2 10*3/uL (ref 0.1–0.9)
MONOCYTES RELATIVE PERCENT: 6 %
NEUTROPHILS ABSOLUTE COUNT: 2.3 10*3/uL (ref 1.4–7.0)
NEUTROPHILS RELATIVE PERCENT: 61 %
PLATELET COUNT: 265 10*3/uL (ref 150–450)
RED BLOOD CELL COUNT: 2.77 x10E6/uL — ABNORMAL LOW (ref 4.14–5.80)
RED CELL DISTRIBUTION WIDTH: 14.5 % (ref 11.6–15.4)
WHITE BLOOD CELL COUNT: 3.7 10*3/uL (ref 3.4–10.8)

## 2022-03-07 LAB — GAMMA GT: GAMMA GLUTAMYL TRANSFERASE: 99 IU/L — ABNORMAL HIGH (ref 0–65)

## 2022-03-07 LAB — COMPREHENSIVE METABOLIC PANEL
ALBUMIN/GLOBULIN RATIO: 1.8 (ref 1.2–2.2)
ALBUMIN: 4 g/dL (ref 3.9–4.9)
ALKALINE PHOSPHATASE: 151 IU/L — ABNORMAL HIGH (ref 44–121)
ALT (SGPT): 42 IU/L (ref 0–44)
AST (SGOT): 51 IU/L — ABNORMAL HIGH (ref 0–40)
BILIRUBIN TOTAL: 0.6 mg/dL (ref 0.0–1.2)
CALCIUM: 9.2 mg/dL (ref 8.6–10.2)
CHLORIDE: 104 mmol/L (ref 96–106)
CO2: 19 mmol/L — ABNORMAL LOW (ref 20–29)
GLOBULIN, TOTAL: 2.2 g/dL (ref 1.5–4.5)
PROTEIN TOTAL: 6.2 g/dL (ref 6.0–8.5)

## 2022-03-07 LAB — MAGNESIUM: MAGNESIUM: 1.7 mg/dL (ref 1.6–2.3)

## 2022-03-07 LAB — TACROLIMUS LEVEL: TACROLIMUS BLOOD: 5.2 ng/mL (ref 2.0–20.0)

## 2022-03-07 LAB — PHOSPHORUS: PHOSPHORUS: 5.1 mg/dL — ABNORMAL HIGH (ref 2.8–4.1)

## 2022-03-07 LAB — BILIRUBIN, DIRECT: BILIRUBIN DIRECT: 0.28 mg/dL (ref 0.00–0.40)

## 2022-03-07 MED ORDER — TACROLIMUS 1 MG CAPSULE, IMMEDIATE-RELEASE
ORAL_CAPSULE | Freq: Two times a day (BID) | ORAL | 11 refills | 30 days | Status: CP
Start: 2022-03-07 — End: 2023-03-07

## 2022-03-07 NOTE — Unmapped (Signed)
Patient's 7/21 Tac level result received today, subpar at 5.2. Contacted [facility], who reported that the one from 7/25, was just put in process today and should result by the weekend. Lab results from today, with increase in lfts and mild elevation in AST( tac pending.) Reached out to PA Czepiga who recommended increasing his dose back to 9mg  bid. Contacted patient and relayed recommendation. He verbalized understanding.

## 2022-03-08 DIAGNOSIS — D649 Anemia, unspecified: Principal | ICD-10-CM

## 2022-03-08 DIAGNOSIS — Z944 Liver transplant status: Principal | ICD-10-CM

## 2022-03-08 DIAGNOSIS — Z5181 Encounter for therapeutic drug level monitoring: Principal | ICD-10-CM

## 2022-03-08 DIAGNOSIS — Z21 Asymptomatic human immunodeficiency virus [HIV] infection status: Principal | ICD-10-CM

## 2022-03-08 DIAGNOSIS — E612 Magnesium deficiency: Principal | ICD-10-CM

## 2022-03-08 DIAGNOSIS — Z9189 Other specified personal risk factors, not elsewhere classified: Principal | ICD-10-CM

## 2022-03-08 DIAGNOSIS — Z796 Long-term use of immunosuppressant medication: Principal | ICD-10-CM

## 2022-03-08 LAB — CBC W/ DIFFERENTIAL
BASOPHILS ABSOLUTE COUNT: 0.1 10*3/uL (ref 0.0–0.2)
BASOPHILS RELATIVE PERCENT: 2 %
EOSINOPHILS ABSOLUTE COUNT: 0.1 10*3/uL (ref 0.0–0.4)
EOSINOPHILS RELATIVE PERCENT: 3 %
HEMATOCRIT: 27.2 — ABNORMAL LOW (ref 37.5–51.0)
HEMOGLOBIN: 8.6 g/dL — ABNORMAL LOW (ref 13.0–17.7)
IMMATURE CELLS: 1
LYMPHOCYTES ABSOLUTE COUNT: 0.7 10*3/uL (ref 0.7–3.1)
LYMPHOCYTES RELATIVE PERCENT: 17 %
MEAN CORPUSCULAR HEMOGLOBIN CONC: 31.6 g/dL (ref 31.5–35.7)
MEAN CORPUSCULAR HEMOGLOBIN: 29.8 pg (ref 26.6–33.0)
MEAN CORPUSCULAR VOLUME: 94 fL (ref 79–97)
MONOCYTES ABSOLUTE COUNT: 0.3 10*3/uL (ref 0.1–0.9)
MONOCYTES RELATIVE PERCENT: 8 %
NEUTROPHILS ABSOLUTE COUNT: 2.9 10*3/uL (ref 1.4–7.0)
NEUTROPHILS RELATIVE PERCENT: 69 %
PLATELET COUNT: 255 10*3/uL (ref 150–450)
RED BLOOD CELL COUNT: 2.89 x10E6/uL — ABNORMAL LOW (ref 4.14–5.80)
RED CELL DISTRIBUTION WIDTH: 14.9 % (ref 11.6–15.4)
WHITE BLOOD CELL COUNT: 4.1 10*3/uL (ref 3.4–10.8)

## 2022-03-08 LAB — TACROLIMUS LEVEL: TACROLIMUS BLOOD: 9.6 ng/mL (ref 2.0–20.0)

## 2022-03-08 MED ORDER — TACROLIMUS 1 MG CAPSULE, IMMEDIATE-RELEASE
ORAL_CAPSULE | Freq: Two times a day (BID) | ORAL | 11 refills | 30 days | Status: CP
Start: 2022-03-08 — End: ?
  Filled 2022-03-14: qty 480, 30d supply, fill #0

## 2022-03-08 NOTE — Unmapped (Addendum)
Patient's Tac dose increased to 9mg  bid, based on 7/21 level at 5.2, but the 7/25 level resulted today at 9.6. Reached out to PharmD Chargualaf who recommended reducing his dose to 8mg  bid. Discussed this with patient, who mentioned he had been reading that the tacrolimus can cause kidney damage. Reminded patient that this was discussed in his liver transplant orientation class, and the way to promote renal health post txp is to maintain good hydration with 80-100 oz of caffeine-free fluids daily. Also instructed him to repeat labs at the hospital this coming Monday before his clinic appt. He verbalized understanding.

## 2022-03-11 ENCOUNTER — Institutional Professional Consult (permissible substitution): Admit: 2022-03-11 | Discharge: 2022-03-11 | Payer: MEDICARE

## 2022-03-11 ENCOUNTER — Other Ambulatory Visit: Admit: 2022-03-11 | Discharge: 2022-03-11 | Payer: MEDICARE

## 2022-03-11 ENCOUNTER — Ambulatory Visit: Admit: 2022-03-11 | Discharge: 2022-03-11 | Payer: MEDICARE | Attending: Family | Primary: Family

## 2022-03-11 DIAGNOSIS — D631 Anemia in chronic kidney disease: Principal | ICD-10-CM

## 2022-03-11 DIAGNOSIS — N184 Chronic kidney disease, stage 4 (severe): Principal | ICD-10-CM

## 2022-03-11 DIAGNOSIS — G8929 Other chronic pain: Principal | ICD-10-CM

## 2022-03-11 DIAGNOSIS — Z796 Long-term use of immunosuppressant medication: Principal | ICD-10-CM

## 2022-03-11 DIAGNOSIS — Z944 Liver transplant status: Principal | ICD-10-CM

## 2022-03-11 DIAGNOSIS — M545 Chronic bilateral low back pain without sciatica: Principal | ICD-10-CM

## 2022-03-11 LAB — CBC W/ AUTO DIFF
BASOPHILS ABSOLUTE COUNT: 0.1 10*9/L (ref 0.0–0.1)
BASOPHILS RELATIVE PERCENT: 1.2 %
EOSINOPHILS ABSOLUTE COUNT: 0.2 10*9/L (ref 0.0–0.5)
EOSINOPHILS RELATIVE PERCENT: 3.2 %
HEMATOCRIT: 27.7 % — ABNORMAL LOW (ref 39.0–48.0)
HEMOGLOBIN: 9.6 g/dL — ABNORMAL LOW (ref 12.9–16.5)
LYMPHOCYTES ABSOLUTE COUNT: 1 10*9/L — ABNORMAL LOW (ref 1.1–3.6)
LYMPHOCYTES RELATIVE PERCENT: 17.4 %
MEAN CORPUSCULAR HEMOGLOBIN CONC: 34.5 g/dL (ref 32.0–36.0)
MEAN CORPUSCULAR HEMOGLOBIN: 30.1 pg (ref 25.9–32.4)
MEAN CORPUSCULAR VOLUME: 87 fL (ref 77.6–95.7)
MEAN PLATELET VOLUME: 8.9 fL (ref 6.8–10.7)
MONOCYTES ABSOLUTE COUNT: 0.2 10*9/L — ABNORMAL LOW (ref 0.3–0.8)
MONOCYTES RELATIVE PERCENT: 3.8 %
NEUTROPHILS ABSOLUTE COUNT: 4.2 10*9/L (ref 1.8–7.8)
NEUTROPHILS RELATIVE PERCENT: 74.4 %
PLATELET COUNT: 242 10*9/L (ref 150–450)
RED BLOOD CELL COUNT: 3.19 10*12/L — ABNORMAL LOW (ref 4.26–5.60)
RED CELL DISTRIBUTION WIDTH: 15.9 % — ABNORMAL HIGH (ref 12.2–15.2)
WBC ADJUSTED: 5.7 10*9/L (ref 3.6–11.2)

## 2022-03-11 LAB — COMPREHENSIVE METABOLIC PANEL
ALBUMIN: 4.2 g/dL (ref 3.4–5.0)
ALKALINE PHOSPHATASE: 174 U/L — ABNORMAL HIGH (ref 46–116)
ALT (SGPT): 26 U/L (ref 10–49)
ANION GAP: 10 mmol/L (ref 5–14)
AST (SGOT): 13 U/L (ref <=34)
BILIRUBIN TOTAL: 0.5 mg/dL (ref 0.3–1.2)
BLOOD UREA NITROGEN: 34 mg/dL — ABNORMAL HIGH (ref 9–23)
BUN / CREAT RATIO: 16
CALCIUM: 9.7 mg/dL (ref 8.7–10.4)
CHLORIDE: 108 mmol/L — ABNORMAL HIGH (ref 98–107)
CO2: 23 mmol/L (ref 20.0–31.0)
CREATININE: 2.18 mg/dL — ABNORMAL HIGH
EGFR CKD-EPI (2021) MALE: 33 mL/min/{1.73_m2} — ABNORMAL LOW (ref >=60)
GLUCOSE RANDOM: 170 mg/dL — ABNORMAL HIGH (ref 70–99)
POTASSIUM: 5.4 mmol/L — ABNORMAL HIGH (ref 3.4–4.8)
PROTEIN TOTAL: 7 g/dL (ref 5.7–8.2)
SODIUM: 141 mmol/L (ref 135–145)

## 2022-03-11 LAB — BILIRUBIN, DIRECT: BILIRUBIN DIRECT: 0.3 mg/dL (ref 0.00–0.30)

## 2022-03-11 LAB — IRON PANEL
IRON SATURATION: 30 % (ref 20–55)
IRON: 85 ug/dL
TOTAL IRON BINDING CAPACITY: 279 ug/dL (ref 250–425)

## 2022-03-11 LAB — TACROLIMUS LEVEL, TROUGH: TACROLIMUS, TROUGH: 8.3 ng/mL (ref 5.0–15.0)

## 2022-03-11 LAB — MAGNESIUM: MAGNESIUM: 1.3 mg/dL — ABNORMAL LOW (ref 1.6–2.6)

## 2022-03-11 LAB — FERRITIN: FERRITIN: 250.9 ng/mL

## 2022-03-11 LAB — PHOSPHORUS: PHOSPHORUS: 4.7 mg/dL (ref 2.4–5.1)

## 2022-03-11 LAB — GAMMA GT: GAMMA GLUTAMYL TRANSFERASE: 78 U/L — ABNORMAL HIGH

## 2022-03-11 LAB — HIV RNA, QUANTITATIVE, PCR: HIV RNA QNT RSLT: NOT DETECTED

## 2022-03-11 LAB — TRANSPLANT HEPATITIS C RNA, QUANTITATIVE, PCR: HCV RNA: NOT DETECTED

## 2022-03-11 MED ORDER — MG-PLUS-PROTEIN 133 MG TABLET
ORAL_TABLET | Freq: Two times a day (BID) | ORAL | 3 refills | 50 days | Status: CP
Start: 2022-03-11 — End: ?
  Filled 2022-03-29: qty 100, 50d supply, fill #0

## 2022-03-11 MED ORDER — INSULIN DEGLUDEC (U-100) 100 UNIT/ML (3 ML) SUBCUTANEOUS PEN
Freq: Every day | SUBCUTANEOUS | 3 refills | 100 days | Status: CP
Start: 2022-03-11 — End: 2023-03-11

## 2022-03-11 MED ORDER — LIDOCAINE 5 % TOPICAL OINTMENT
Freq: Every day | TOPICAL | 0 refills | 0 days | Status: CP
Start: 2022-03-11 — End: 2023-03-11

## 2022-03-11 MED ORDER — MYCOPHENOLATE MOFETIL 250 MG CAPSULE
ORAL_CAPSULE | Freq: Two times a day (BID) | ORAL | 11 refills | 30.00000 days | Status: CP
Start: 2022-03-11 — End: 2023-03-11
  Filled 2022-03-14: qty 180, 30d supply, fill #0

## 2022-03-11 NOTE — Unmapped (Signed)
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  Mr. Audet just received a liver transplant.  He will continue to be followed in the [facility] Transplant specialty pharmacy queue. .    Additional information provided to the patient: n/a    Roderic Palau  [facility] Specialty Pharmacist

## 2022-03-11 NOTE — Unmapped (Signed)
[facility] CLINIC PHARMACY NOTE  Matthew Key  [id number]    Medication changes today:   1. DECREASE mycophenolate to 750 mg BID  2. INCREASE Tresiba to 15 units at bedtime  3. STOP tamsulosin  4. START Mag plus protein 133 mg BID  5. START Diclofenac gel TID PRN back pain  6. START lidocaine patch daily PRN back pain  7. DECREASE methocarbamol to TID PRN    Education/Adherence tools provided today:  - Provided updated medication list  - Provided additional education on immunosuppression and transplant related medications including reviewing indications of medications, dosing and side effects  - Provided additional pill box education  - Provided assistance with pill box fill    Follow up items:  1. Goal of understanding indications and dosing of immunosuppression medications  2. BG  3. Consider Envarsus if tremors/knee jerks are problematic  4. Consider mTORi transition given renal dysfunction   5. Pillbox - errors noted on last check  6. Ca2+/Vit D supplementation    Next visit with pharmacy in 1-2 weeks  ____________________________________________________________________    Matthew Key is a 61 y.o. 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     Immunologic Risk: first transplant    Induction Agent : basiliximab    Donor Factors: DBD    Other PMH significant for Hypertension and Stroke, T2DM, OSA on CPAP    Post op course uncomplicated    Rejection History: NTD  Infection History: NTD  ___________________________________________________________________    Last seen by pharmacy 2 weeks ago    Interval History: ED 7/19 for R arm pain and numbness, no evidence of thrombus     Seen by pharmacy today for: medication management and pill box fill and adherence education    CC:  Patient complains of back pain.    General: Weight loss, back pain  Neuro: No issues and Tremors - mild  CV: No issues   Resp: No issues   GI: No issues   GU: No issues  Derm: No issues  Psych: No issues.    Fluid Status:   Edema no  SOB no  Intake: 4 - 5 x 16.9 oz bottles  Does report drinking some orange juice    There were no vitals filed for this visit.  ___________________________________________________________________    Allergies   Allergen Reactions    Venom-Honey Bee Swelling       Medications reviewed in EPIC medication station and updated today by the clinical pharmacist practitioner.    Current Outpatient Medications   Medication Instructions    acetaminophen (TYLENOL) 325-650 mg, Oral, Every 6 hours PRN    aspirin (ECOTRIN) 81 mg, Oral, Daily (standard)    atorvastatin (LIPITOR) 20 mg, Oral, Daily (standard)    blood sugar diagnostic (ACCU-CHEK GUIDE TEST STRIPS) Strp Use to check blood sugar as directed with insulin 3 times a day & for symptoms of high or low blood sugar    blood-glucose meter Misc Use to test blood sugar as directed    docusate sodium (COLACE) 100 mg, Oral, 2 times a day PRN    gabapentin (NEURONTIN) 300 mg, Oral, At bedtime    glucagon 3 mg/actuation Spry Use 1 spray into the left nostril every fifteen (15) minutes as needed (low blood sugar).    hydrOXYzine (VISTARIL) 50 mg, Oral, Nightly PRN    insulin ASPART (NOVOLOG FLEXPEN) 100 unit/mL (3 mL) injection pen Inject under the skin per sliding  scale prior to meals. If premeal BG 151-200 take 2 additional units for correction, if 201-250 take 4 additional units, etc. Max dose 36 units per day    insulin degludec (TRESIBA FLEXTOUCH U-100) 15 Units, Subcutaneous, Daily (standard), Adjust as instructed.    lancets Misc Use to check blood sugar as directed with insulin 3 times a day & for symptoms of high or low blood sugar.    lidocaine (XYLOCAINE) 5 % ointment Topical, Daily (standard)    magnesium oxide-Mg AA chelate (MAGNESIUM, AMINO ACID CHELATE,) 133 mg 1 tablet, Oral, 2 times a day    melatonin 3 mg, Oral, Every evening    methocarbamoL (ROBAXIN) 500 mg, Oral, 3 times a day PRN    mycophenolate (CELLCEPT) 750 mg, Oral, 2 times a day (standard)    pantoprazole (PROTONIX) 40 mg, Oral, Daily (standard)    pen needle, diabetic (BD ULTRA-FINE NANO PEN NEEDLE) 32 gauge x 5/32 (4 mm) Ndle Use as directed  1-2 times per day    sulfamethoxazole-trimethoprim (BACTRIM) 400-80 mg per tablet 80 mg of trimethoprim, Oral, 3 times weekly    tacrolimus (PROGRAF) 8 mg, Oral, 2 times a day    valGANciclovir (VALCYTE) 450 mg, Oral, Daily (standard)         GRAFT FUNCTION: stable    Lab Results   Component Value Date    AST 13 03/11/2022    AST 12 03/05/2022    ALT 26 03/11/2022    ALT 18 03/05/2022    Total Bilirubin 0.5 03/11/2022    Total Bilirubin 0.6 03/05/2022      Zero hour biopsy: mild reperfusion injury on donor graft. No malignancy on explanted liver.  Biopsies to date: NTD    Renal Function: improving but above pre txp baseline of 0.9    Lab Results   Component Value Date    Creatinine 2.18 (H) 03/11/2022    Creatinine 2.47 (H) 03/06/2022    Creatinine 2.53 (H) 03/05/2022    Creatinine 1.81 (H) 03/01/2022    Creatinine 2.01 (H) 02/27/2022    Creatinine 1.45 (H) 02/04/2022    Creatinine 1.71 (H) 01/21/2022    Creatinine 1.43 (H) 01/08/2022       Proteinuria/UPC: No/ none.  No results found for: PCRATIOUR    CURRENT IMMUNOSUPPRESSION:    Tacrolimus (Prograf) 8 mg BID  Tacrolimus Goal: 8 - 10   Mycophenolate mofetil (Cellcept) 1000 mg BID      IMMUNOSUPPRESSION DRUG LEVELS:  Lab Results   Component Value Date    Tacrolimus, Trough 8.3 03/11/2022    Tacrolimus, Trough 9.4 02/20/2022    Tacrolimus, Trough 6.4 02/15/2022    Tacrolimus Lvl 9.6 03/05/2022    Tacrolimus, Timed 5.2 03/01/2022    Tacrolimus, Timed 13.7 02/25/2022    Tacrolimus, Timed 8.6 02/18/2022     Prograf level is accurate 12 hour trough    Patient  c/o leg twitching when trying to fall asleep at night . Pt notes leg twitching is significantly improved. Pt also notes mild hand tremors.     WBC/ANC:  wnl  Lab Results   Component Value Date    WBC 5.7 03/11/2022 WBC 3.7 03/06/2022    WBC 5.0 03/05/2022       Plan: Will decrease MMF to 750 mg BID. If twitching and tremors become bothersome consider Envarsus. Continue to monitor.      OI Prophylaxis:   CMV Status: D+/ R+, moderate risk .   CMV prophylaxis: valganciclovir 450 mg daily x  3 months per protocol (end 05/09/22)  No results found for: CMVCP  Estimated Creatinine Clearance: 37.5 mL/min (A) (based on SCr of 2.18 mg/dL (H)).    PCP Prophylaxis: bactrim SS 1 tab MWF x 6 months (end 08/08/22)    Thrush: completed in hospital    Patient is  tolerating infectious prophylaxis well    Plan: Continue per protocol. Continue to monitor.      CVA history: asa 81 mg   The 10-year ASCVD risk score (Arnett DK, et al., 2019) is: 12.3%  No results found for: LIPID    Statin therapy: Indicated; currently on atorvastatin 20 mg daily  Plan: Consider increasing to atorvastatin 40 mg daily at next visit. Continue to monitor       BP: Goal < 140/90. Clinic vitals reported above  Home BP ranges: no log  Current meds include: none  Plan: within goal. Continue to monitor    Anemia of CKD:  H/H:   Lab Results   Component Value Date    HGB 9.6 (L) 03/11/2022     Lab Results   Component Value Date    HCT 27.7 (L) 03/11/2022     Iron panel:  Lab Results   Component Value Date    IRON 85 03/11/2022    TIBC 279 03/11/2022    FERRITIN 250.9 03/11/2022     Lab Results   Component Value Date    Iron Saturation (%) 30 03/11/2022     Prior ESA use: none post transplnat  Plan: stable. No change.  Continue to monitor.     DM:   Lab Results   Component Value Date    A1C 5.2 02/05/2022   . Goal A1c < 7  History of Dm? Yes: T2DM  Established with endocrinologist/PCP for BG managment? Yes: Dr. Melrose Nakayama with [facility]  Currently on:   Tresiba 12 units daily  Novolog SSI    (was on Ozempic 1 mg weekly until stopped 6/19 for hypotension)    Home BS log:  - Fasting ~130 (High 170)  - PP 150 - 250    Diet: small portions for breakfast/lunch and normal portion for dinner.  Also drinking Ensure  Exercise:not yet  Hypoglycemia: no  Plan: Increase Tresiba to 15 units.  At future visit may consider restarting Ozempic to decrease insulin requirement.  Continue to monitor.       Electrolytes:  K upper limit of normal. Reports drinking orange juice . Mag low.  Lab Results   Component Value Date    Potassium 5.4 (H) 03/11/2022    Potassium 5.5 (H) 03/05/2022    Potassium, Bld 4.2 02/06/2022    Sodium 141 03/11/2022    Sodium 139 03/05/2022    Sodium Whole Blood 141 02/06/2022    Magnesium 1.3 (L) 03/11/2022    Magnesium 1.8 03/05/2022    CO2 23.0 03/11/2022    CO2 20 03/05/2022     Meds currently on: none  Plan: Council on foods high in potassium. Start Mag 1 tab BIDContinue to monitor     GI/BM: pt reports 1 BM  Meds currently on: docusate PRN, Miralax PRN, pantoprazole 40 mg daily  Plan: no change. Continue to monitor    Pain: pt reports moderate pain.    Meds currently on: APAP PRN, gabapentin 300 mg HS, methocarbamol 500 mg TID   Plan: Decrease Methocarbamol use to PRN. Continue to monitor    Urinary retention: started post top after failed trial of void  Meds  currently on: tamsulosin 0.4 mg daily  Plan: Stop tamsulosin    Bone health:   Vitamin D Level: none available. Goal > 30.   Lab Results   Component Value Date    Vitamin D Total (25OH) 27.9 09/11/2020       Lab Results   Component Value Date    Calcium 9.7 03/11/2022    Calcium 9.2 03/06/2022    Calcium 9.9 03/05/2022    Calcium 8.6 02/04/2022       Last DEXA results:  01/2020: osteopenia (spine T score -1.1, femoral neck -1.7)  Current meds include:   Plan: Vitamin D level  needs to be drawn with next lab schedule. Consider adding Vitamin D and calcium given osteopenia if D is low.Continue to monitor.     Anxiety:  Meds currently on: hydroxyzine 50 HS PRN (rarely needs)    Women's/Men's Health:  Matthew Key is a 61 y.o. male. Patient reports no men's/women's health issues  Plan: Continue to monitor    Immunizations:  Influenza [Annual]: Received 06/2021    PCV13: Received 11/2017  PPSV23: Received 03/2020  PCV20: Received 08/2021    Shingrix Zoster [2 doses, 2 - 6 months apart]: Received 05/2020, 07/2020    COVID-19 [3 primary doses, 2 boosters]: 1st dose given 10/2019, 2nd dose given 10/2019, 3rd dose given 05/2020, and Booster given 08/2021 (bivalent)    Immunization status: up to date and documented.    Pharmacy preference:  [facility]  Medication Refills:  N/a  Medication Access:  N/a    Adherence: Patient has average understanding of medications; was able to independently identify names/doses of immunosuppressants and OI meds.  Patient  does fill their own pill box on a regular basis at home.  Patient brought medication card:yes  Pill box: supervised pill box fill    Plan: Supervised pill box fill for pt.  Asked that he bring filled box for pharmacist review to next visit; provided extensive adherence counseling/intervention    Patient was reviewed with  Alveria Apley, NP  who was agreement with the stated plan:     During this visit, the following was completed:   BG log data assessment  BP log data assessment  Labs ordered and evaluated  complex treatment plan >1 DS   I spent a total of 40 minutes face to face with the patient delivering clinical care and providing education/counseling.    All questions/concerns were addressed to the patient's satisfaction.  __________________________________________  Olivia Mackie, PharmD, CPP  Abdominal Transplant Clinical Pharmacist Practitioner

## 2022-03-11 NOTE — Unmapped (Addendum)
[facility] Pharmacist has reviewed a new prescription for mycophenolate that indicates a dose decrease.  Patient was counseled on this dosage change by cpp [facility]- see epic note from 7/31.  Next refill call date adjusted if necessary.    [facility] has reviewed a new prescription for tacrolimus that indicates a dose decrease.  Patient was counseled on this dosage change by coordinator [facility]- see epic note from 7/28.  Next refill call date adjusted if necessary.          Clinical Assessment Needed For: Dose Change  Medication: Mycophenolate 250mg  capsule  Last Fill Date/Day Supply: 02/15/2022 / 30 days  Copay $0  Was previous dose already scheduled to fill: No    Notes to Pharmacist: N/A      Clinical Assessment Needed For: Dose Change  Medication: Tacrolimus 1mg  capsule  Last Fill Date/Day Supply: 02/15/2022 / 30 days  Copay $0  Was previous dose already scheduled to fill: No    Notes to Pharmacist: Mag Ox - $10.74, [name] RFTS 08/17, Lidocaine requires PA

## 2022-03-11 NOTE — Unmapped (Signed)
Please see patient pharmacy visit for documentation.

## 2022-03-12 DIAGNOSIS — Z944 Liver transplant status: Principal | ICD-10-CM

## 2022-03-12 DIAGNOSIS — Z796 Long-term use of immunosuppressant medication: Principal | ICD-10-CM

## 2022-03-12 MED ORDER — VALGANCICLOVIR 450 MG TABLET
ORAL_TABLET | Freq: Every day | ORAL | 1 refills | 30 days | Status: CP
Start: 2022-03-12 — End: 2022-06-10
  Filled 2022-04-01: qty 30, 30d supply, fill #0

## 2022-03-12 MED ORDER — HYDROXYZINE PAMOATE 50 MG CAPSULE
ORAL_CAPSULE | Freq: Every evening | ORAL | 3 refills | 30 days | Status: CP | PRN
Start: 2022-03-12 — End: ?
  Filled 2022-05-02: qty 30, 30d supply, fill #0

## 2022-03-12 MED ORDER — LIDOCAINE 5 % TOPICAL PATCH
MEDICATED_PATCH | Freq: Two times a day (BID) | TRANSDERMAL | 0 refills | 15 days | Status: CP | PRN
Start: 2022-03-12 — End: 2022-04-11

## 2022-03-12 NOTE — Unmapped (Signed)
TRANSPLANT SURGERY PROGRESS NOTE    Assessment and Plan  - He overall looks well   - Would like to continue to monitor his weight and nutrition . No more weight loss . Daily weights  - Staples /sutures removed today, steri strips x1 week   - Ok to drive , if patient can preform defensive driving and off narcotics  - Continue to trend Alk phos, GGT trending down  - FK level pending on 9/8   - Start using wrist brace , no improvement may need RUE EMG   - Mg 1.3 ,start oral supplementation  - Insulin adjustments per pharmacy  - Lidocaine patch for back pain   - Social work PRN  - RTC in 4 weeks or PRN       Subjective  Matthew Key is a 61 y.o. male who is POD #33 from his OLT. His course was complicated by right arm pain , he was sent to ED to r/o DVT. He was seen in the ED, DVT and MI rule out.   Today he presents to clinic with continued pain in his right arm/wrist. He describes the pain as numbness and cramping starting from his elbow to his wrist. Hand warm no weakness. C/f nerve involvement carpal tunnel versus cervical nerve compression. Urged him to elevated arm and start using carpal tunnel wrist brace. He may need further diagnostic testing , EMG of RUE to elevate for nerve compression.   His stapes and sutures were removed today, steri strips place x 1 week. His incisions are healing nicely no c/f infection , no erythema or drainage.   Since our last visit he has lost some weight, do think it was water weight. We discussed keeping up with his nutrition and hydration . He should not loose any more weight. Daily weights.   We discussed lifting driving restrictions. If he feel like he can partake in defensive driving, slam on the brake or jerk the wheel in an emergency and he is off narcotics he could start driving.   His LFT had a small bump but have trended back down, with increase in Tac dose. His CBC is stable. Slightly elevated Alk phos with a decreasing GGT, should continue to trend. RTC in 4 weeks for weight and nutrition check or PRN.   Objective    Vitals:    03/11/22 1015   BP: 148/67   BP Site: L Arm   BP Position: Sitting   Pulse: 77   Resp: 16   Weight: 86 kg (189 lb 11.2 oz)   Height: 172.7 cm (5' 7.99)      Body mass index is 28.85 kg/m??.     Physical Exam:    General Appearance:   No acute distress  Lungs:                Clear to auscultation bilaterally  Heart:                           Regular rate and rhythm  Abdomen:                Soft, non-tender, non-distended  Extremities:              Warm and well perfused    Incision: clean/dry/intact      Data Review:  All lab results last 24 hours:    Recent Results (from the past 72 hour(s))   Ferritin  Collection Time: 03/11/22  9:15 AM   Result Value Ref Range    Ferritin 250.9 10.5 - 307.3 ng/mL   Iron Panel    Collection Time: 03/11/22  9:15 AM   Result Value Ref Range    Iron 85 65 - 175 ug/dL    TIBC 161 096 - 045 ug/dL    Iron Saturation (%) 30 20 - 55 %   HIV RNA, Quantitative, PCR    Collection Time: 03/11/22  9:15 AM   Result Value Ref Range    HIV RNA Quant Result Not Detected Not Detected   Transplant Hepatitis C RNA, Quant, PCR    Collection Time: 03/11/22  9:15 AM   Result Value Ref Range    HCV RNA Not Detected Not Detected   Tacrolimus Level, Trough    Collection Time: 03/11/22  9:15 AM   Result Value Ref Range    Tacrolimus, Trough 8.3 5.0 - 15.0 ng/mL   Gamma GT    Collection Time: 03/11/22  9:15 AM   Result Value Ref Range    GGT 78 (H) 0 - 73 U/L   Magnesium Level    Collection Time: 03/11/22  9:15 AM   Result Value Ref Range    Magnesium 1.3 (L) 1.6 - 2.6 mg/dL   Phosphorus Level    Collection Time: 03/11/22  9:15 AM   Result Value Ref Range    Phosphorus 4.7 2.4 - 5.1 mg/dL   Bilirubin, Direct    Collection Time: 03/11/22  9:15 AM   Result Value Ref Range    Bilirubin, Direct 0.30 0.00 - 0.30 mg/dL   Comprehensive Metabolic Panel    Collection Time: 03/11/22  9:15 AM   Result Value Ref Range    Sodium 141 135 - 145 mmol/L Potassium 5.4 (H) 3.4 - 4.8 mmol/L    Chloride 108 (H) 98 - 107 mmol/L    CO2 23.0 20.0 - 31.0 mmol/L    Anion Gap 10 5 - 14 mmol/L    BUN 34 (H) 9 - 23 mg/dL    Creatinine 4.09 (H) 0.60 - 1.10 mg/dL    BUN/Creatinine Ratio 16     eGFR CKD-EPI (2021) Male 33 (L) >=60 mL/min/1.63m2    Glucose 170 (H) 70 - 99 mg/dL    Calcium 9.7 8.7 - 81.1 mg/dL    Albumin 4.2 3.4 - 5.0 g/dL    Total Protein 7.0 5.7 - 8.2 g/dL    Total Bilirubin 0.5 0.3 - 1.2 mg/dL    AST 13 <=91 U/L    ALT 26 10 - 49 U/L    Alkaline Phosphatase 174 (H) 46 - 116 U/L   CBC w/ Differential    Collection Time: 03/11/22  9:15 AM   Result Value Ref Range    WBC 5.7 3.6 - 11.2 10*9/L    RBC 3.19 (L) 4.26 - 5.60 10*12/L    HGB 9.6 (L) 12.9 - 16.5 g/dL    HCT 47.8 (L) 29.5 - 48.0 %    MCV 87.0 77.6 - 95.7 fL    MCH 30.1 25.9 - 32.4 pg    MCHC 34.5 32.0 - 36.0 g/dL    RDW 62.1 (H) 30.8 - 15.2 %    MPV 8.9 6.8 - 10.7 fL    Platelet 242 150 - 450 10*9/L    Neutrophils % 74.4 %    Lymphocytes % 17.4 %    Monocytes % 3.8 %    Eosinophils % 3.2 %  Basophils % 1.2 %    Absolute Neutrophils 4.2 1.8 - 7.8 10*9/L    Absolute Lymphocytes 1.0 (L) 1.1 - 3.6 10*9/L    Absolute Monocytes 0.2 (L) 0.3 - 0.8 10*9/L    Absolute Eosinophils 0.2 0.0 - 0.5 10*9/L    Absolute Basophils 0.1 0.0 - 0.1 10*9/L

## 2022-03-12 NOTE — Unmapped (Signed)
The patient is requesting a medication refill

## 2022-03-12 NOTE — Unmapped (Signed)
Received on call page at 5p. Called back and pt reported missing morning dose and wanted to know if he should still take it. Advised pt to go ahead and take medication and take night medication early and then restart tomorrow morning. Explained that pt does not need to make up the morning dose. Pt verbalized understanding.

## 2022-03-12 NOTE — Unmapped (Signed)
[facility] Clinical Assessment & Refill Coordination Note    Matthew Key, Matthew Key: 1960-10-02  Phone: [phone] (home)     All above HIPAA information was verified with patient.     Was a Nurse, learning disability used for this call? No    Specialty Medication(s):   Transplant: mycophenolate mofetil 250mg , tacrolimus 1mg  and valgancyclovir 450mg      Current Outpatient Medications   Medication Sig Dispense Refill   ??? acetaminophen (TYLENOL) 325 MG tablet Take 1-2 tablets (325-650 mg total) by mouth every six (6) hours as needed for pain or fever. 100 tablet 11   ??? aspirin (ECOTRIN) 81 MG tablet Take 1 tablet (81 mg total) by mouth daily. 90 tablet 3   ??? atorvastatin (LIPITOR) 20 MG tablet Take 1 tablet (20 mg total) by mouth daily. 30 tablet 2   ??? blood sugar diagnostic (ACCU-CHEK GUIDE TEST STRIPS) Strp Use to check blood sugar as directed with insulin 3 times a day & for symptoms of high or low blood sugar 100 strip 11   ??? blood-glucose meter Misc Use to test blood sugar as directed 1 each 0   ??? docusate sodium (COLACE) 100 MG capsule Take 1 capsule (100 mg total) by mouth two (2) times a day as needed for constipation. 60 capsule 11   ??? gabapentin (NEURONTIN) 300 MG capsule Take 1 capsule (300 mg total) by mouth at bedtime. 90 capsule 3   ??? glucagon 3 mg/actuation Spry Use 1 spray into the left nostril every fifteen (15) minutes as needed (low blood sugar). 2 each 1   ??? hydrOXYzine (VISTARIL) 50 MG capsule Take 1 capsule (50 mg total) by mouth nightly as needed for anxiety. 30 capsule 3   ??? insulin ASPART (NOVOLOG FLEXPEN) 100 unit/mL (3 mL) injection pen Inject under the skin per sliding scale prior to meals. If premeal BG 151-200 take 2 additional units for correction, if 201-250 take 4 additional units, etc. Max dose 36 units per day 15 mL 6   ??? insulin degludec (TRESIBA FLEXTOUCH U-100) 100 unit/mL (3 mL) InPn Inject 0.15 mL (15 Units total) under the skin daily. Adjust as instructed. 15 mL 3   ??? lancets Misc Use to check blood sugar as directed with insulin 3 times a day & for symptoms of high or low blood sugar. 100 each 0   ??? lidocaine (LIDODERM) 5 % patch Place 1 patch on the skin every twelve (12) hours as needed. Apply to affected area for 12 hours only each day (then remove patch) 30 patch 0   ??? magnesium oxide-Mg AA chelate (MAGNESIUM, AMINO ACID CHELATE,) 133 mg Take 1 tablet by mouth two (2) times a day. 100 tablet 3   ??? melatonin 3 mg Tab Take 1 tablet (3 mg total) by mouth every evening. 30 tablet 11   ??? methocarbamoL (ROBAXIN) 500 MG tablet Take 1 tablet (500 mg total) by mouth Three (3) times a day as needed. 90 tablet 0   ??? mycophenolate (CELLCEPT) 250 mg capsule Take 3 capsules (750 mg total) by mouth Two (2) times a day. 180 capsule 11   ??? pantoprazole (PROTONIX) 40 MG tablet Take 1 tablet (40 mg total) by mouth daily. 30 tablet 11   ??? pen needle, diabetic (BD ULTRA-FINE NANO PEN NEEDLE) 32 gauge x 5/32 (4 mm) Ndle Use as directed  1-2 times per day 100 each 12   ??? sulfamethoxazole-trimethoprim (BACTRIM) 400-80 mg per tablet Take 1 tablet (80  mg of trimethoprim total) by mouth 3 (three) times a week. 12 tablet 4   ??? tacrolimus (PROGRAF) 1 MG capsule Take 8 capsules (8 mg total) by mouth two (2) times a day. 480 capsule 11   ??? valGANciclovir (VALCYTE) 450 mg tablet Take 1 tablet (450 mg total) by mouth daily. 30 tablet 2     No current facility-administered medications for this visit.        Changes to medications: Matthew Key reports no changes at this time.    Allergies   Allergen Reactions   ??? Venom-Honey Bee Swelling       Changes to allergies: No    SPECIALTY MEDICATION ADHERENCE     Mycophenolate 250mg   : 7 days of medicine on hand   Tacrolimus 1mg   : 6 days of medicine on hand   valganciclovir 450mg   : 7 days of medicine on hand       Medication Adherence    Patient reported X missed doses in the last month: 0  Specialty Medication: tacrolimus 1mg   Patient is on additional specialty medications: Yes  Additional Specialty Medications: Mycophenolate 250mg   Patient Reported Additional Medication X Missed Doses in the Last Month: 0  Patient is on more than two specialty medications: Yes  Specialty Medication: valganciclovir 450mg   Patient Reported Additional Medication X Missed Doses in the Last Month: 0          Specialty medication(s) dose(s) confirmed: Patient reports changes to the regimen as follows: no changes     Are there any concerns with adherence? No    Adherence counseling provided? Not needed    CLINICAL MANAGEMENT AND INTERVENTION      Clinical Benefit Assessment:    Do you feel the medicine is effective or helping your condition? Yes    Clinical Benefit counseling provided? Not needed    Adverse Effects Assessment:    Are you experiencing any side effects? No    Are you experiencing difficulty administering your medicine? No    Quality of Life Assessment:    How many days over the past month did your transplant  keep you from your normal activities? For example, brushing your teeth or getting up in the morning. 0    Have you discussed this with your provider? Not needed    Acute Infection Status:    Acute infections noted within Epic:  No active infections  Patient reported infection: None    Therapy Appropriateness:    Is therapy appropriate and patient progressing towards therapeutic goals? Yes, therapy is appropriate and should be continued    DISEASE/MEDICATION-SPECIFIC INFORMATION      N/A    PATIENT SPECIFIC NEEDS     - Does the patient have any physical, cognitive, or cultural barriers? No    - Is the patient high risk? Yes, patient is taking a REMS drug. Medication is dispensed in compliance with REMS program    - Does the patient require a Care Management Plan? No       SHIPPING     Specialty Medication(s) to be Shipped:   Transplant: mycophenolate mofetil 250mg , tacrolimus 1mg  and valgancyclovir 450mg     Other medication(s) to be shipped: bactrim, lidocaine ointment, voltaren gel, vistaril, aspirin, tylenol     Changes to insurance: No    Delivery Scheduled: Yes, Expected medication delivery date: 03/15/2022.  However, Rx request for refills was sent to the provider as there are none remaining.     Medication will be delivered via UPS  to the confirmed prescription address in [facility].    The patient will receive a drug information handout for each medication shipped and additional FDA Medication Guides as required.  Verified that patient has previously received a Conservation officer, historic buildings and a Surveyor, mining.    The patient or caregiver noted above participated in the development of this care plan and knows that they can request review of or adjustments to the care plan at any time.      All of the patient's questions and concerns have been addressed.    Thad Ranger   [facility] Specialty Pharmacist

## 2022-03-13 MED ORDER — SULFAMETHOXAZOLE 400 MG-TRIMETHOPRIM 80 MG TABLET
ORAL_TABLET | ORAL | 4 refills | 28 days | Status: CP
Start: 2022-03-13 — End: ?
  Filled 2022-03-29: qty 12, 28d supply, fill #0

## 2022-03-14 DIAGNOSIS — Z944 Liver transplant status: Principal | ICD-10-CM

## 2022-03-14 DIAGNOSIS — Z796 Long-term use of immunosuppressant medication: Principal | ICD-10-CM

## 2022-03-14 MED FILL — ASPIRIN 81 MG TABLET,DELAYED RELEASE: ORAL | 90 days supply | Qty: 90 | Fill #0

## 2022-03-14 MED FILL — ACETAMINOPHEN 325 MG TABLET: ORAL | 13 days supply | Qty: 100 | Fill #0

## 2022-03-14 NOTE — Unmapped (Signed)
Matthew Key Mike Cancellieri 's Valganciclovir shipment will be canceled  as a result of the medication is too soon to refill until 03/30/2022 (pt confirmed picked up from [place]).     I have reached out to the patient  at [phone] and communicated the delivery change. We will not reschedule the medication and have removed this/these medication(s) from the work request.  We have not confirmed the new delivery date.     All other meds are still being sent as originally scheduled.

## 2022-03-14 NOTE — Unmapped (Signed)
Patient seen in clinic today, accompanied by his wife. He was seen by pharmacy as well. He reports he is doing well. He has no c/o n/v/d/constipation, sob or LE swelling. He noted pain to his right wrist and FA with movement, verbalizing concern that it might be d/t thrombus. Mild swelling noted to inner aspect of wrist. He did have hx of blood clots to his shoulder areas.Per NP exam, patient may be having carpel tunnel sx and could apply a wrist brace. His wound is approximated with no drainage or erythema. Staples/sutures were removed today by PA Czepiga and steristrips applied, with instruction to keep them on for 1 week. Patient otherwise has no c/o n/v/d/constipation/sob or LE swelling. His weight is at 189 lbs now, BMI ~29. Patient encouraged to continue eating small frequent high protein snacks every few hours, but he reports his appetite is good. PA reviewed labs and recommended continuing to trend, adding that an [place] may be needed if they continue to rise. 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 Provided instructions for working back up to driving. He verbalized understanding of all discussed. Arranged for rtc in 4 weeks per PA. Patient mentioned at end of visit that he came via uber, but inquired if txp team could assist with ride home as done before. Reached out to SW, [employer], who explained to him that that can only be done at discharge after inpatient stay. Per SW, patient should discuss with his insurance company's transport provider, who requires 2 days notice. He verbalized understanding.

## 2022-03-15 DIAGNOSIS — Z86718 Personal history of other venous thrombosis and embolism: Principal | ICD-10-CM

## 2022-03-15 DIAGNOSIS — M25531 Pain in right wrist: Principal | ICD-10-CM

## 2022-03-15 LAB — BILIRUBIN, DIRECT: BILIRUBIN DIRECT: <0.1 mg/dL (ref 0.00–0.40)

## 2022-03-15 MED ORDER — GABAPENTIN 300 MG CAPSULE
ORAL_CAPSULE | Freq: Every evening | ORAL | 3 refills | 90.00000 days | Status: CP
Start: 2022-03-15 — End: 2022-03-15
  Filled 2022-03-29: qty 90, 30d supply, fill #0

## 2022-03-15 NOTE — Unmapped (Addendum)
On call page received from pt, return call placed. Pt c/o pain to R wrist. States he went to walk-in urgent ortho clinic yesterday for eval. Pt reports they took xrays, gave him a cortisone injection to his R wrist, and applied a brace.  Pt pages this morning stating he is having pain and swelling.  Per pt report, no acute injury or clot noted in imaging. He is able to wiggle fingers, denies discoloration and skin is warm to touch. Pt has taken Tylenol, elevating/positioning and ice for discomfort with no effect (pain not worsening or improving).  Pt not in acute distress, but would like guidance on how to handle. TNC told pt she would discuss with his primary coordinator and team during business hours. Pt agreeable and aware team will follow up later this morning.

## 2022-03-15 NOTE — Unmapped (Addendum)
Received msg from on-call TNC that the patient contacted her this morning about persistent right wrist pain. Discussed with NP Czepiga who recommended checking an EMG of his RUE and increasing his gabapentin to 300mg  tid.  She also approved of a twice weekly lab interval.Spoke with patient and shared recommendations. Provided TPA's contact in the event that she is unable to reach him by early next week. He verbalized understanding. Sent updated rx to [facility] per his request.   Matthew Key to request lab results from 8/2. They agreed to route message to the team that forwards labs. Sent updated request to them for twice weekly labs.

## 2022-03-18 DIAGNOSIS — R748 Abnormal levels of other serum enzymes: Principal | ICD-10-CM

## 2022-03-18 DIAGNOSIS — Z796 Long-term use of immunosuppressant medication: Principal | ICD-10-CM

## 2022-03-18 DIAGNOSIS — Z944 Liver transplant status: Principal | ICD-10-CM

## 2022-03-18 NOTE — Unmapped (Signed)
Contacted by NP Czepiga, who recommended patient complete US in the next two weeks d/t elevated ALP/GGT. If labs improve, however, it can be canceled. Unable to get lab results from [facility] after leaving several messages. Arranged Liver US appt for same day as EMG. Spoke to patient and instructed him to go to [facility] for labs from now on, explaining that d/t lack of timely results it has been too difficult to manage his care appropriately, adding that if we did not receive any results from them from last week, he will need to go to [facility] tomorrow. He reports he is driving now and can manage this. He is aware he should be npo 6 hrs before liver US on Thurs. Told him this TNC would call him back later today to let him know if     Received call from [facility], after speaking with patient, and was told they faxed over the lab results from 8/2 last Friday. Let them know they have not been received and requested they fax them again, along with 8/4 results. By afternoon, partial lab results received for only 8/2. Contacted patient and instructed him to repeat labs tomorrow at [facility]. He verbalized understanding.

## 2022-03-19 ENCOUNTER — Ambulatory Visit: Admit: 2022-03-19 | Discharge: 2022-03-23 | Payer: MEDICARE

## 2022-03-19 DIAGNOSIS — Z796 Long-term use of immunosuppressant medication: Principal | ICD-10-CM

## 2022-03-19 DIAGNOSIS — Z944 Liver transplant status: Principal | ICD-10-CM

## 2022-03-19 LAB — TACROLIMUS LEVEL, TROUGH: TACROLIMUS, TROUGH: 9.8 ng/mL (ref 2.0–20.0)

## 2022-03-19 LAB — COMPREHENSIVE METABOLIC PANEL
ALBUMIN/GLOBULIN RATIO: 1.8 (ref 1.2–2.2)
ALBUMIN: 4 g/dL (ref 3.9–4.9)
ALBUMIN: 4.1 g/dL (ref 3.4–5.0)
ALKALINE PHOSPHATASE: 250 IU/L — ABNORMAL HIGH (ref 44–121)
ALKALINE PHOSPHATASE: 226 U/L — ABNORMAL HIGH (ref 46–116)
ALT (SGPT): 321 IU/L — ABNORMAL HIGH (ref 0–44)
ALT (SGPT): 204 U/L — ABNORMAL HIGH (ref 10–49)
ANION GAP: 8 mmol/L (ref 5–14)
AST (SGOT): 291 IU/L — ABNORMAL HIGH (ref 0–40)
AST (SGOT): 42 U/L — ABNORMAL HIGH (ref <=34)
BILIRUBIN TOTAL: 0.4 mg/dL (ref 0.0–1.2)
BILIRUBIN TOTAL: 0.4 mg/dL (ref 0.3–1.2)
BLOOD UREA NITROGEN: 36 g/dL — ABNORMAL HIGH (ref 8–27)
BLOOD UREA NITROGEN: 35 mg/dL — ABNORMAL HIGH (ref 9–23)
BUN / CREAT RATIO: 18 (ref 10–24)
BUN / CREAT RATIO: 15
CALCIUM: 9.4 mg/dL (ref 8.6–10.2)
CALCIUM: 9.3 mg/dL (ref 8.7–10.4)
CHLORIDE: 105 mmol/L (ref 96–106)
CHLORIDE: 106 mmol/L (ref 98–107)
CO2: 18 mmol/L — ABNORMAL LOW (ref 20–29)
CO2: 22 mmol/L (ref 20.0–31.0)
CREATININE: 2.01 mg/dL — ABNORMAL HIGH (ref 0.76–1.27)
CREATININE: 2.26 mg/dL — ABNORMAL HIGH
EGFR CKD-EPI (2021) MALE: 32 mL/min/{1.73_m2} — ABNORMAL LOW (ref >=60)
EGFR CKD-EPI AA MALE: 37 mL/min/{1.73_m2} — ABNORMAL LOW
GLOBULIN, TOTAL: 2.2 g/dL (ref 1.5–4.5)
GLUCOSE RANDOM: 199 mg/dL — ABNORMAL HIGH (ref 70–99)
GLUCOSE RANDOM: 300 mg/dL — ABNORMAL HIGH (ref 70–179)
POTASSIUM: 5.6 mmol/L — ABNORMAL HIGH (ref 3.5–5.2)
POTASSIUM: 5.4 mmol/L — ABNORMAL HIGH (ref 3.4–4.8)
PROTEIN TOTAL: 6.2 g/dL (ref 6.0–8.5)
PROTEIN TOTAL: 6.8 g/dL (ref 5.7–8.2)
SODIUM: 140 mmol/L (ref 134–144)
SODIUM: 136 mmol/L (ref 135–145)

## 2022-03-19 LAB — CBC W/ AUTO DIFF
BASOPHILS ABSOLUTE COUNT: 0 10*9/L (ref 0.0–0.1)
BASOPHILS RELATIVE PERCENT: 0.7 %
EOSINOPHILS ABSOLUTE COUNT: 0.1 10*9/L (ref 0.0–0.5)
EOSINOPHILS RELATIVE PERCENT: 2.2 %
HEMATOCRIT: 27.7 % — ABNORMAL LOW (ref 39.0–48.0)
HEMOGLOBIN: 9.7 g/dL — ABNORMAL LOW (ref 12.9–16.5)
LYMPHOCYTES ABSOLUTE COUNT: 1.1 10*9/L (ref 1.1–3.6)
LYMPHOCYTES RELATIVE PERCENT: 16 %
MEAN CORPUSCULAR HEMOGLOBIN CONC: 35.1 g/dL (ref 32.0–36.0)
MEAN CORPUSCULAR HEMOGLOBIN: 32.7 pg — ABNORMAL HIGH (ref 25.9–32.4)
MEAN CORPUSCULAR VOLUME: 93.2 fL (ref 77.6–95.7)
MEAN PLATELET VOLUME: 9.3 fL (ref 6.8–10.7)
MONOCYTES ABSOLUTE COUNT: 0.4 10*9/L (ref 0.3–0.8)
MONOCYTES RELATIVE PERCENT: 5.9 %
NEUTROPHILS ABSOLUTE COUNT: 4.9 10*9/L (ref 1.8–7.8)
NEUTROPHILS RELATIVE PERCENT: 75.2 %
PLATELET COUNT: 182 10*9/L (ref 150–450)
RED BLOOD CELL COUNT: 2.97 10*12/L — ABNORMAL LOW (ref 4.26–5.60)
RED CELL DISTRIBUTION WIDTH: 15.6 % — ABNORMAL HIGH (ref 12.2–15.2)
WBC ADJUSTED: 6.6 10*9/L (ref 3.6–11.2)

## 2022-03-19 LAB — CBC W/ DIFFERENTIAL
BASOPHILS ABSOLUTE COUNT: 0.1 10*3/uL (ref 0.0–0.2)
BASOPHILS RELATIVE PERCENT: 1 %
EOSINOPHILS ABSOLUTE COUNT: 0.1 10*3/uL (ref 0.0–0.4)
EOSINOPHILS RELATIVE PERCENT: 2 %
HEMATOCRIT: 28.1 % — ABNORMAL LOW (ref 37.5–51.0)
HEMOGLOBIN: 9.1 g/dL — ABNORMAL LOW (ref 13.0–17.7)
IMMATURE CELLS: 1 %
LYMPHOCYTES ABSOLUTE COUNT: 0.8 10*3/uL (ref 0.7–3.1)
LYMPHOCYTES RELATIVE PERCENT: 11 %
MEAN CORPUSCULAR HEMOGLOBIN CONC: 32.4 g/dL (ref 31.5–35.7)
MEAN CORPUSCULAR HEMOGLOBIN: 28.8 pg (ref 26.6–33.0)
MEAN CORPUSCULAR VOLUME: 89 fL (ref 79–97)
MONOCYTES ABSOLUTE COUNT: 0.5 10*3/uL (ref 0.1–0.9)
MONOCYTES RELATIVE PERCENT: 7 %
NEUTROPHILS ABSOLUTE COUNT: 5.6 10*3/uL (ref 1.4–7.0)
NEUTROPHILS RELATIVE PERCENT: 78 %
PLATELET COUNT: 178 10*3/uL (ref 150–450)
RED BLOOD CELL COUNT: 3.16 x10E6/uL — ABNORMAL LOW (ref 4.14–5.80)
RED CELL DISTRIBUTION WIDTH: 14.1 % (ref 11.6–15.4)
WHITE BLOOD CELL COUNT: 7.1 10*3/uL (ref 3.4–10.8)

## 2022-03-19 LAB — GAMMA GT
GAMMA GLUTAMYL TRANSFERASE: 201 IU/L — ABNORMAL HIGH (ref 0–65)
GAMMA GLUTAMYL TRANSFERASE: 163 U/L — ABNORMAL HIGH

## 2022-03-19 LAB — SLIDE REVIEW

## 2022-03-19 LAB — PROTIME-INR
INR: 1.09
PROTIME: 12.4 s (ref 9.8–12.8)

## 2022-03-19 LAB — MAGNESIUM: MAGNESIUM: 1.3 mg/dL — ABNORMAL LOW (ref 1.6–2.3)

## 2022-03-19 LAB — BILIRUBIN, DIRECT: BILIRUBIN DIRECT: 0.19 mg/dL (ref 0.00–0.40)

## 2022-03-19 LAB — PHOSPHORUS: PHOSPHORUS: 4.1 mg/dL (ref 2.8–4.1)

## 2022-03-19 NOTE — Unmapped (Signed)
5 weeks S/P liver transplant, sent to ED from clinic for evaluation of abnormal lab work, patient has no physical complaints

## 2022-03-19 NOTE — Unmapped (Signed)
 ED Attending Note  [facility] Emergency Department   I saw the patient on 03/20/2022.      Patient Name: Matthew Key  Chief Complaint: Abnormal Lab        Patient Name: Matthew Key  Chief Complaint: Abnormal Lab            ED Clinical Impression     Final diagnoses:   Transaminitis (Primary)   History of liver transplant (CMS-HCC)           Medical Decision Making, Impression, ED Course, Assessment and Plan       -I have reviewed the outside/prior medical records including telephone encounter from today, 03/15/22, transplant note 03/11/22, ER note 02/27/22, discharge summary 02/15/22  -Laboratory workup reviewed by me with stable anemia, transaminitis which is improved from this AM, stable Cr, mild hyperkalemia  -Transplant surgery was consulted to aid in the evaluation and management of this patient   -Patient/caregiver discussions: discussed care plan with patient and his wife  -Social Determinants of health which significantly affected care: recent transplant  -Indications for observation/admission (or consideration of observation/admission) and/or appropriateness for outpatient management: recent liver transplant with transaminitis        Matthew Key is a 61 y.o. male with recent liver transplant presenting to ED after outpatient transaminases were noted to be elevated. Has been largely asymptomatic. Was referred to ED for additional evaluation. Physical exam is reassuring, patient is not jaundiced. Plan is to repeat CMP and obtain transplant doppler US. DDx includes vascular stenosis/occlusion, biliary obstruction, transplant rejection. Incisions are healing well.       ED Course:   ED Course as of 03/20/22 0852   Tue Mar 19, 2022   1538 I was contacted by patient's transplant coordinator to contact Dr. Imogene Burn. I spoke with Dr. Imogene Burn over the phone and plan will be to follow up transplant ultrasound.      Discussed with Dr. Imogene Burn from transplant surgery who is planning for admission to observation for close monitoring. Additional workup pending results of doppler US.      History   History obtained from: Patient     Matthew Key is a 61 y.o. male with history of liver transplant on 6/29, sent in by liver transplant coordinator for elevated transaminases. Patient had outpatient labs drawn this morning with CMP showing new transaminitis. He has been largely asymptomatic aside from increased fatigue today. Denies RUQ pain, vomiting, nausea, jaundice, diarrhea. Has been compliant with antirejection meds; denies EtOH or tylenol use. Was referred to ED for transplant doppler US. Reports incisions healing well; staples have already been removed.     Does report some persistent R wrist pain/hand tingling which has been ongoing; plan was for outpatient EMG. Denies worsening of this pain, new numbness or weakness while in removable splint.       Past Medical/Past Surgical History:   Reviewed in EHR including nursing documentation as outlined. Pertinent PMH/PSH also noted above in HPI.   Past Medical History:   Diagnosis Date   ??? AKI (acute kidney injury) (CMS-HCC) 12/14/2020   ??? Anxiety 10/22/2013   ??? Arthritis    ??? Cervical radiculopathy 12/03/2016   ??? Chronic pain disorder     Lower back   ??? Cirrhosis (CMS-HCC)    ??? Dental abscess 10/2020   ??? Duodenal ulcer 12/01/2017   ??? GERD (gastroesophageal reflux disease)    ??? History of transfusion    ??? Hyperlipidemia 10/22/2013   ???  Hypertension     under control with meds and weight loss   ??? Hypotension 01/29/2022   ??? Liver disease    ??? Sleep apnea, obstructive     Have machine   ??? Stroke (CMS-HCC)     mild stroke   ??? Type 2 diabetes mellitus with diabetic neuropathy, with long-term current use of insulin (CMS-HCC) 06/09/2014     Patient Active Problem List   Diagnosis   ??? Hyponatremia   ??? Leukocytosis   ??? Non-alcoholic micronodular cirrhosis of liver (CMS-HCC)   ??? Pleural effusion, right   ??? Primary insomnia   ??? OSA on CPAP   ??? Thrombocytopenia (CMS-HCC)   ??? Type 2 diabetes mellitus without complication (CMS-HCC)   ??? Spontaneous bacterial peritonitis (CMS-HCC)   ??? 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 Left upper extremity swelling   ??? Adrenal insufficiency (CMS-HCC)   ??? Acute deep vein thrombosis (DVT) of brachial vein of left upper extremity (CMS-HCC)   ??? Acute thrombosis of right basilic vein   ??? Hypofibrinogenemia (CMS-HCC)   ??? Abdominal ascites   ??? Pleural effusion associated with hepatic disorder   ??? Essential hypertension   ??? Acute medial meniscus tear of right knee   ??? AKI (acute kidney injury) (CMS-HCC)   ??? Anxiety   ??? Cerebrovascular accident (CVA) (CMS-HCC)   ??? Cervical radicular pain   ??? Cervical radiculopathy   ??? Chronic bilateral low back pain without sciatica   ??? Neck pain, chronic   ??? COVID-19   ??? De Quervain's tenosynovitis, left   ??? Duodenal ulcer   ??? Elevated BUN   ??? Elevated liver enzymes   ??? Erectile dysfunction   ??? Esophageal varices determined by endoscopy (CMS-HCC)   ??? Fatty liver   ??? Gastroesophageal reflux disease   ??? History of stroke without residual deficits   ??? Hospital discharge follow-up   ??? Hyperlipidemia   ??? Lactic acidosis   ??? Obesity (BMI 30-39.9)   ??? Right shoulder pain   ??? Right upper quadrant abdominal pain   ??? Tubular adenoma of colon   ??? Water retention   ??? Hypertension associated with diabetes (CMS-HCC)   ??? Acute metabolic encephalopathy   ??? Decompensated hepatic cirrhosis (CMS-HCC)   ??? Hyperkalemia   ??? Hypotension   ??? Liver transplant status (CMS-HCC)     Past Surgical History:   Procedure Laterality Date   ??? KNEE SURGERY     ??? PR CATH PLACE/CORON ANGIO, IMG SUPER/INTERP,R&L HRT CATH, L HRT VENTRIC N/A 12/19/2020    Procedure: CATH LEFT/RIGHT HEART CATHETERIZATION;  Surgeon: Rosana Hoes, MD;  Location: [facility] CATH;  Service: Cardiology   ??? PR COLONOSCOPY FLX DX W/COLLJ SPEC WHEN PFRMD N/A 12/07/2021    Procedure: COLONOSCOPY, FLEXIBLE, PROXIMAL TO SPLENIC FLEXURE; DIAGNOSTIC, W/WO COLLECTION SPECIMEN BY BRUSH OR WASH;  Surgeon: Annie Paras, MD;  [facility]: [facility];  Service: Gastroenterology   ??? PR TRANSPLANT LIVER,ALLOTRANSPLANT N/A 02/06/2022    Procedure: LIVER ALLOTRANSPLANTATION; ORTHOTOPIC, PARTIAL OR WHOLE, FROM CADAVER OR LIVING DONOR, ANY AGE;  Surgeon: Florene Glen, MD;  Location: MAIN OR [facility];  Service: Transplant   ??? PR TRANSPLANT,PREP DONOR LIVER/ARTERIAL N/A 02/06/2022    Procedure: BACKBNCH RECONSTRUCT OF CAD/LIVE DONOR LIVER GFT PRIOR TRANSPLANT; ARTERIAL ANASTAMOSIS, EA;  Surgeon: Florene Glen, MD;  Location: MAIN OR [facility];  Service: Transplant   ??? PR UPPER GI ENDOSCOPY,BIOPSY N/A 10/12/2020    Procedure: UGI ENDOSCOPY; WITH  BIOPSY, SINGLE OR MULTIPLE;  Surgeon: Marene Lenz, MD;  Location: [facility];  Service: Gastroenterology   ??? PR UPPER GI ENDOSCOPY,DIAGNOSIS N/A 01/03/2022    Procedure: UGI ENDO, INCLUDE ESOPHAGUS, STOMACH, & DUODENUM &/OR JEJUNUM; DX W/WO COLLECTION SPECIMN, BY BRUSH OR WASH;  Surgeon: Marene Lenz, MD;  Location: [facility];  Service: Gastroenterology   ??? PR UPPER GI ENDOSCOPY,LIGAT VARIX N/A 12/07/2021    Procedure: UGI ENDO; W/BAND LIG ESOPH &/OR GASTRIC VARICES;  Surgeon: Annie Paras, MD;  [facility]: [facility];  Service: Gastroenterology   ??? ROOT CANAL      Front teeth       Social History/Family History:   Reviewed in EMR, I agree with nursing documentation. Additional pertinent social and family history noted in HPI.   Social History     Tobacco Use   ??? Smoking status: Never   ??? Smokeless tobacco: Never   Vaping Use   ??? Vaping Use: Never used   Substance Use Topics   ??? Alcohol use: Not Currently   ??? Drug use: Not Currently     Family History   Problem Relation Age of Onset   ??? Edema Mother    ??? Alzheimer's disease Father    ??? Aortic dissection Brother    ??? Early death Brother    ??? Aneurysm Brother        ROS  A review of systems reviewed and are negative except as stated in the HPI or noted below.     Home Medications:    Current Facility-Administered Medications:   ???  acetaminophen (TYLENOL) tablet 650 mg, 650 mg, Oral, Q6H PRN, Rickard Rhymes, MD, 650 mg at 03/20/22 0449  ???  docusate sodium (COLACE) capsule 100 mg, 100 mg, Oral, BID PRN, Rickard Rhymes, MD, 100 mg at 03/19/22 2157  ???  gabapentin (NEURONTIN) capsule 300 mg, 300 mg, Oral, TID, Madelin Headings V, MD, 300 mg at 03/20/22 0846  ???  hydrOXYzine (ATARAX) tablet 25 mg, 25 mg, Oral, QID, Rickard Rhymes, MD, 25 mg at 03/19/22 2140  ???  magnesium oxide-Mg AA chelate (Magnesium Plus Protein) 1 tablet, 1 tablet, Oral, BID, Rickard Rhymes, MD  ???  melatonin tablet 3 mg, 3 mg, Oral, QPM, Rickard Rhymes, MD, 3 mg at 03/19/22 2157  ???  mycophenolate (CELLCEPT) capsule 750 mg, 750 mg, Oral, BID, Rickard Rhymes, MD, 750 mg at 03/19/22 2140  ???  ondansetron (ZOFRAN) injection 4 mg, 4 mg, Intravenous, Q4H PRN, Rickard Rhymes, MD  ???  pantoprazole (PROTONIX) EC tablet 40 mg, 40 mg, Oral, Daily, Rickard Rhymes, MD  ???  sulfamethoxazole-trimethoprim (BACTRIM) 400-80 mg tablet 80 mg of trimethoprim, 1 tablet, Oral, Once per day on Mon Wed Fri, Adam M Awe, MD  ???  tacrolimus (PROGRAF) capsule 8 mg, 8 mg, Oral, BID, Rickard Rhymes, MD, 8 mg at 03/20/22 0844  ???  valGANciclovir (VALCYTE) tablet 450 mg, 450 mg, Oral, Every other day, Rickard Rhymes, MD    Current Outpatient Medications:   ???  aspirin (ECOTRIN) 81 MG tablet, Take 1 tablet (81 mg total) by mouth daily., Disp: 90 tablet, Rfl: 3  ???  acetaminophen (TYLENOL) 325 MG tablet, Take 1-2 tablets (325-650 mg total) by mouth every six (6) hours as needed for pain or fever., Disp: 100 tablet, Rfl: 11  ???  atorvastatin (LIPITOR) 20 MG tablet, Take 1 tablet (20 mg total) by mouth  daily., Disp: 30 tablet, Rfl: 2  ???  blood sugar diagnostic (ACCU-CHEK GUIDE TEST STRIPS) Strp, Use to check blood sugar as directed with insulin 3 times a day & for symptoms of high or low blood sugar, Disp: 100 strip, Rfl: 11  ???  blood-glucose meter Misc, Use to test blood sugar as directed, Disp: 1 each, Rfl: 0  ???  docusate sodium (COLACE) 100 MG capsule, Take 1 capsule (100 mg total) by mouth two (2) times a day as needed for constipation., Disp: 60 capsule, Rfl: 11  ???  gabapentin (NEURONTIN) 300 MG capsule, Take 1 capsule (300 mg total) by mouth Three (3) times a day., Disp: 90 capsule, Rfl: 2  ???  glucagon 3 mg/actuation Spry, Use 1 spray into the left nostril every fifteen (15) minutes as needed (low blood sugar)., Disp: 2 each, Rfl: 1  ???  hydrOXYzine (VISTARIL) 50 MG capsule, Take 1 capsule (50 mg total) by mouth nightly as needed for anxiety., Disp: 30 capsule, Rfl: 3  ???  insulin ASPART (NOVOLOG FLEXPEN) 100 unit/mL (3 mL) injection pen, Inject under the skin per sliding scale prior to meals. If premeal BG 151-200 take 2 additional units for correction, if 201-250 take 4 additional units, etc. Max dose 36 units per day, Disp: 15 mL, Rfl: 6  ???  insulin degludec (TRESIBA FLEXTOUCH U-100) 100 unit/mL (3 mL) InPn, Inject 0.15 mL (15 Units total) under the skin daily. Adjust as instructed., Disp: 15 mL, Rfl: 3  ???  lancets Misc, Use to check blood sugar as directed with insulin 3 times a day & for symptoms of high or low blood sugar., Disp: 100 each, Rfl: 0  ???  magnesium oxide-Mg AA chelate (MAGNESIUM, AMINO ACID CHELATE,) 133 mg, Take 1 tablet by mouth two (2) times a day., Disp: 100 tablet, Rfl: 3  ???  melatonin 3 mg Tab, Take 1 tablet (3 mg total) by mouth every evening., Disp: 30 tablet, Rfl: 11  ???  methocarbamoL (ROBAXIN) 500 MG tablet, Take 1 tablet (500 mg total) by mouth Three (3) times a day as needed., Disp: 90 tablet, Rfl: 0  ???  mycophenolate (CELLCEPT) 250 mg capsule, Take 3 capsules (750 mg total) by mouth Two (2) times a day., Disp: 180 capsule, Rfl: 11  ???  pantoprazole (PROTONIX) 40 MG tablet, Take 1 tablet (40 mg total) by mouth daily., Disp: 30 tablet, Rfl: 11  ???  pen needle, diabetic (BD ULTRA-FINE NANO PEN NEEDLE) 32 gauge x 5/32 (4 mm) Ndle, Use as directed  1-2 times per day, Disp: 100 each, Rfl: 12  ???  sulfamethoxazole-trimethoprim (BACTRIM) 400-80 mg per tablet, Take 1 tablet (80 mg of trimethoprim total) by mouth 3 (three) times a week., Disp: 12 tablet, Rfl: 4  ???  tacrolimus (PROGRAF) 1 MG capsule, Take 8 capsules (8 mg total) by mouth two (2) times a day., Disp: 480 capsule, Rfl: 11  ???  valGANciclovir (VALCYTE) 450 mg tablet, Take 1 tablet (450 mg total) by mouth daily., Disp: 30 tablet, Rfl: 1    ALLERGIES:   Venom-honey bee       Physical Exam     ED Triage Vitals   Enc Vitals Group      BP 03/19/22 1300 128/66      Heart Rate 03/19/22 1243 74      SpO2 Pulse --       Resp 03/19/22 1300 18      Temp 03/19/22 1300 37 ??C (98.6 ??F)  Temp Source 03/19/22 1300 Oral      SpO2 03/19/22 1243 98 %     Vital signs reviewed.  GENERAL: well-appearing in no acute distress, resting comfortably in bed  HEENT: airway is patent, moist mucus membranes, oropharynx clear without lesions, exudates, thrush  EYES: pupils equal and reactive to light bilaterally, no scleral icterus, conjunctivae clear  CARDIAC: normal rate, regular rhythm, normal S1 and S2,  2+ peripheral pulses, normal capillary refill  PULMONARY: normal work of breathing on room air, good air movement bilaterally, lungs are clear to auscultation bilaterally without wheezes, crackles, rhonchi  ABDOMINAL: normal bowel sounds, abdomen soft, nontender to palpation, nondistended, no hepatomegaly or splenomegaly, incisions are healing well   MSK: no joint deformity or swelling, moves all 4 extremities without difficulty, no lower extremity edema  NEURO: alert and oriented ??3, normal mental status, PERRL, EOMI,moves all 4 ext equally, no asterixis or tremors  SKIN: Warm and dry without rashes, no jaundice  PSYCH: Mood and affect are normal        Labs and Radiology     Labs Reviewed   COMPREHENSIVE METABOLIC PANEL - Abnormal; Notable for the following components:       Result Value    Potassium 5.4 (*) BUN 35 (*)     Creatinine 2.26 (*)     eGFR CKD-EPI (2021) Male 32 (*)     Glucose 300 (*)     AST 42 (*)     ALT 204 (*)     Alkaline Phosphatase 226 (*)     All other components within normal limits   GAMMA GT - Abnormal; Notable for the following components:    GGT 163 (*)     All other components within normal limits   BASIC METABOLIC PANEL - Abnormal; Notable for the following components:    Potassium 5.1 (*)     BUN 40 (*)     Creatinine 2.09 (*)     eGFR CKD-EPI (2021) Male 35 (*)     Glucose 249 (*)     All other components within normal limits   MAGNESIUM - Abnormal; Notable for the following components:    Magnesium 1.2 (*)     All other components within normal limits   HEPATIC FUNCTION PANEL - Abnormal; Notable for the following components:    ALT 143 (*)     Alkaline Phosphatase 205 (*)     All other components within normal limits   GAMMA GT - Abnormal; Notable for the following components:    GGT 132 (*)     All other components within normal limits   CBC W/ AUTO DIFF - Abnormal; Notable for the following components:    RBC 2.97 (*)     HGB 9.7 (*)     HCT 27.7 (*)     MCH 32.7 (*)     RDW 15.6 (*)     All other components within normal limits    Narrative:     WBC, PLT and/or Differential requires smear review. Expected TAT is 2 hours.   SLIDE REVIEW - Abnormal; Notable for the following components:    Smear Review Comments See Comment (*)     Poikilocytosis Moderate (*)     All other components within normal limits   CBC W/ AUTO DIFF - Abnormal; Notable for the following components:    RBC 2.98 (*)     HGB 8.9 (*)     HCT 25.6 (*)  RDW 15.3 (*)     All other components within normal limits   PHOSPHORUS - Normal   CBC W/ DIFFERENTIAL    Narrative:     The following orders were created for panel order CBC w/ Differential.  Procedure                               Abnormality         Status                     ---------                               -----------         ------                     CBC w/ Differential[[id number]]         Abnormal            Final result               Morphology Review[[id number]]           Abnormal            Final result                 Please view results for these tests on the individual orders.   PROTIME-INR   CBC W/ DIFFERENTIAL    Narrative:     The following orders were created for panel order CBC w/ Differential.  Procedure                               Abnormality         Status                     ---------                               -----------         ------                     CBC w/ Differential[[id number]]         Abnormal            Final result                 Please view results for these tests on the individual orders.   PROTIME-INR   TACROLIMUS LEVEL, TROUGH     US Liver Transplant   Final Result   Prominent narrowing at the main portal vein anastomosis with increased portal vein velocity just beyond the anastomosis which can be seen with portal vein stenosis in the correct clinical setting.      Elevated resistive indices in the hepatic arteries, nonspecific.       Please see below for data measurements:         Liver: 17.9 cm      Common bile duct: 0.85 cm      Right kidney: 11.9 cm    Left kidney: 9.8 cm       Main portal vein diameter: 0.51 cm      Main portal vein pre anastomosis velocity: 0.28 m/s   Main portal vein  anastomosis velocity: 0.59 m/s   Main portal vein post anastomosis velocity: 0.52 m/s   Anterior right portal vein velocity: 0.43 m/s   Posterior right portal vein velocity: 0.25 m/s   Left portal vein velocity: 0.23 m/s      Right portal vein flow: hepatopetal   Left portal vein flow: hepatopetal      Common hepatic artery resistive index: 0.89 and systolic acceleration time 50 msec   Right hepatic artery resistive index: 0.84 and systolic acceleration time 42 msec   Left hepatic artery resistive index: 0.84 and systolic acceleration time 50 msec      Left hepatic vein flow: mono-bi   Middle hepatic vein flow: mono-bi   Right hepatic vein flow: mono-bi   Inferior vena cava flow: mono-bi      Splenic vein midline: hepatopetal    Splenic vein proximal: hepatopetal       Aorta: partially visualized   Inferior vena cava: Partially visualized      Spleen: 12.6 cm      Abdominal free fluid visualized: no                Procedures   Procedures        Portions of this record have been created using Scientist, clinical (histocompatibility and immunogenetics). Dictation errors have been sought, but may not have been identified and corrected.      Bryson Ha, MD  03/20/22 [phone]

## 2022-03-19 NOTE — Unmapped (Addendum)
Matthew Key is a 61 y.o. 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     Hospital course was uncomplicated. He was evaluated with a liver doppler ultrasound which showed patent veins and arteries. Labs were obtained and showed improvement in LFTs.      At the time of discharge the patient was voiding, tolerating a regular diet , was ambulatory, and reported good pain control with PO medications. The patient will be discharged home on  LOS: 0 days    in good condition.

## 2022-03-19 NOTE — Unmapped (Signed)
Transplant Surgery History & Physical  Note    Assessment:  Matthew Key is a 61 y.o. 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    PLAN:   - [facility] admission, observation status  - mIVF, regular diet, NPO at [facility]   - Repeat labs including LFTs in AM  - Follow up liver transplant doppler read  - Resume home immunosuppression and prophylaxis medications    History of Present Illness:   Chief Complaint:  Elevated LFTs s/p OLT    Matthew Key is a 61 y.o. male who is seen in consultation for elevated LFTs s/p OLT on 02/06/22 at the request of [name], * on the Surg Transplant [facility]) service.     Matthew Key is a 61 y.o. 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 Patient was instructed to present to [facility] ED for STAT liver doppler ultrasound for further evaluation. Repeat labs demonstrated interval improvement in LFTs and doppler is pending at time of note.    Patient has been afebrile and hemodynamically stable. He has been taking his immunosuppression diligently. He does not endorse any worrisome or bothersome symptoms. He is otherwise feeling well and denies fevers, chills, nausea, night sweats, abdominal pain, jaundice, and scleral icterus.      Past Medical History:   Past Medical History:   Diagnosis Date   ??? AKI (acute kidney injury) (CMS-HCC) 12/14/2020   ??? Anxiety 10/22/2013   ??? Arthritis    ??? Cervical radiculopathy 12/03/2016   ??? Chronic pain disorder     Lower back   ??? Cirrhosis (CMS-HCC)    ??? Dental abscess 10/2020   ??? Duodenal ulcer 12/01/2017   ??? GERD (gastroesophageal reflux disease)    ??? History of transfusion    ??? Hyperlipidemia 10/22/2013   ??? Hypertension     under control with meds and weight loss   ??? Hypotension 01/29/2022   ??? Liver disease    ??? Sleep apnea, obstructive     Have machine   ??? Stroke (CMS-HCC)     mild stroke   ??? Type 2 diabetes mellitus with diabetic neuropathy, with long-term current use of insulin (CMS-HCC) 06/09/2014       Past Surgical History:  Past Surgical History:   Procedure Laterality Date   ??? KNEE SURGERY     ??? PR CATH PLACE/CORON ANGIO, IMG SUPER/INTERP,R&L HRT CATH, L HRT VENTRIC N/A 12/19/2020    Procedure: CATH LEFT/RIGHT HEART CATHETERIZATION;  Surgeon: Rosana Hoes, MD;  Location: [facility] CATH;  Service: Cardiology   ??? PR COLONOSCOPY FLX DX W/COLLJ SPEC WHEN PFRMD N/A 12/07/2021    Procedure: COLONOSCOPY, FLEXIBLE, PROXIMAL TO SPLENIC FLEXURE; DIAGNOSTIC, W/WO COLLECTION SPECIMEN BY BRUSH OR WASH;  Surgeon: Annie Paras, MD;  Location: [facility];  Service: Gastroenterology   ??? PR TRANSPLANT LIVER,ALLOTRANSPLANT N/A 02/06/2022    Procedure: LIVER ALLOTRANSPLANTATION; ORTHOTOPIC, PARTIAL OR WHOLE, FROM CADAVER OR LIVING DONOR, ANY AGE;  Surgeon: Florene Glen, MD;  Location: MAIN OR [facility];  Service: Transplant   ??? PR TRANSPLANT,PREP DONOR LIVER/ARTERIAL N/A 02/06/2022    Procedure: [facility] RECONSTRUCT OF CAD/LIVE DONOR  LIVER GFT PRIOR TRANSPLANT; ARTERIAL ANASTAMOSIS, EA;  Surgeon: Florene Glen, MD;  Location: MAIN OR [facility];  Service: Transplant   ??? PR UPPER GI ENDOSCOPY,BIOPSY N/A 10/12/2020    Procedure: UGI ENDOSCOPY; WITH BIOPSY, SINGLE OR MULTIPLE;  Surgeon: Marene Lenz, MD;  Location: [facility];  Service: Gastroenterology   ??? PR UPPER GI ENDOSCOPY,DIAGNOSIS N/A 01/03/2022    Procedure: UGI ENDO, INCLUDE ESOPHAGUS, STOMACH, & DUODENUM &/OR JEJUNUM; DX W/WO COLLECTION SPECIMN, BY BRUSH OR WASH;  Surgeon: Marene Lenz, MD;  Location: [facility];  Service: Gastroenterology   ??? PR UPPER GI ENDOSCOPY,LIGAT VARIX N/A 12/07/2021    Procedure: UGI ENDO; W/BAND LIG ESOPH &/OR GASTRIC VARICES;  Surgeon: Annie Paras, MD;  Location: [facility];  Service: Gastroenterology   ??? ROOT CANAL      Front teeth       Medications:  No current facility-administered medications on file prior to encounter.     Current Outpatient Medications on File Prior to Encounter   Medication Sig Dispense Refill   ??? acetaminophen (TYLENOL) 325 MG tablet Take 1-2 tablets (325-650 mg total) by mouth every six (6) hours as needed for pain or fever. 100 tablet 11   ??? aspirin (ECOTRIN) 81 MG tablet Take 1 tablet (81 mg total) by mouth daily. 90 tablet 3   ??? atorvastatin (LIPITOR) 20 MG tablet Take 1 tablet (20 mg total) by mouth daily. 30 tablet 2   ??? blood sugar diagnostic (ACCU-CHEK GUIDE TEST STRIPS) Strp Use to check blood sugar as directed with insulin 3 times a day & for symptoms of high or low blood sugar 100 strip 11   ??? blood-glucose meter Misc Use to test blood sugar as directed 1 each 0   ??? docusate sodium (COLACE) 100 MG capsule Take 1 capsule (100 mg total) by mouth two (2) times a day as needed for constipation. 60 capsule 11   ??? gabapentin (NEURONTIN) 300 MG capsule Take 1 capsule (300 mg total) by mouth Three (3) times a day. 90 capsule 2   ??? glucagon 3 mg/actuation Spry Use 1 spray into the left nostril every fifteen (15) minutes as needed (low blood sugar). 2 each 1   ??? hydrOXYzine (VISTARIL) 50 MG capsule Take 1 capsule (50 mg total) by mouth nightly as needed for anxiety. 30 capsule 3   ??? insulin ASPART (NOVOLOG FLEXPEN) 100 unit/mL (3 mL) injection pen Inject under the skin per sliding scale prior to meals. If premeal BG 151-200 take 2 additional units for correction, if 201-250 take 4 additional units, etc. Max dose 36 units per day 15 mL 6   ??? insulin degludec (TRESIBA FLEXTOUCH U-100) 100 unit/mL (3 mL) InPn Inject 0.15 mL (15 Units total) under the skin daily. Adjust as instructed. 15 mL 3 ??? lancets Misc Use to check blood sugar as directed with insulin 3 times a day & for symptoms of high or low blood sugar. 100 each 0   ??? lidocaine (LIDODERM) 5 % patch Place 1 patch on the skin every twelve (12) hours as needed. Apply to affected area for 12 hours only each day (then remove patch) 30 patch 0   ??? magnesium oxide-Mg AA chelate (MAGNESIUM, AMINO ACID CHELATE,) 133 mg Take 1 tablet by mouth two (2) times a day. 100 tablet 3   ??? melatonin 3 mg Tab Take 1 tablet (3 mg total) by mouth every evening. 30 tablet 11   ???  methocarbamoL (ROBAXIN) 500 MG tablet Take 1 tablet (500 mg total) by mouth Three (3) times a day as needed. 90 tablet 0   ??? mycophenolate (CELLCEPT) 250 mg capsule Take 3 capsules (750 mg total) by mouth Two (2) times a day. 180 capsule 11   ??? pantoprazole (PROTONIX) 40 MG tablet Take 1 tablet (40 mg total) by mouth daily. 30 tablet 11   ??? pen needle, diabetic (BD ULTRA-FINE NANO PEN NEEDLE) 32 gauge x 5/32 (4 mm) Ndle Use as directed  1-2 times per day 100 each 12   ??? sulfamethoxazole-trimethoprim (BACTRIM) 400-80 mg per tablet Take 1 tablet (80 mg of trimethoprim total) by mouth 3 (three) times a week. 12 tablet 4   ??? tacrolimus (PROGRAF) 1 MG capsule Take 8 capsules (8 mg total) by mouth two (2) times a day. 480 capsule 11   ??? valGANciclovir (VALCYTE) 450 mg tablet Take 1 tablet (450 mg total) by mouth daily. 30 tablet 1       Allergies:  Allergies   Allergen Reactions   ??? Venom-Honey Bee Swelling       Family History:  Family History   Problem Relation Age of Onset   ??? Edema Mother    ??? Alzheimer's disease Father    ??? Aortic dissection Brother    ??? Early death Brother    ??? Aneurysm Brother        Social History:   Social History     Tobacco Use   ??? Smoking status: Never   ??? Smokeless tobacco: Never   Vaping Use   ??? Vaping Use: Never used   Substance Use Topics   ??? Alcohol use: Not Currently   ??? Drug use: Not Currently       Review of Systems  10 systems were reviewed and are negative except as noted specifically in the HPI.    Objective  Vitals:   Temp:  [37 ??C (98.6 ??F)] 37 ??C (98.6 ??F)  Heart Rate:  [69-74] 69  Resp:  [18] 18  BP: (128)/(66) 128/66  SpO2:  [98 %-100 %] 100 %    Intake/Output last 24 hours:  No intake or output data in the 24 hours ending 03/19/22 1707    Physical Exam:   CONSTITUTIONAL: No acute distress. Well-appearing consistent with stated age.   EYES: Anicteric. Extraocular movements are grossly intact.   EAR, NOSE, MOUTH AND THROAT: Mucous membranes moist. Normal dentition. Neck supple.   HEART/CARDIOVASCULAR: Regular rate  CHEST/PULMONARY: Symmetric chest rise on room air  ABDOMEN/GASTROINTESTINAL: Soft, non-tender to palpation, non-distended. Palpable graft in right hemiabdomen. Mercedes incision well healed with interval staple removal.  MUSCULOSKELETAL: Moves all 4 extremities spontaneously. Full range of motion.   SKIN: Warm. Well perfused without cyanosis or edema.   NEUROLOGIC: Alert and oriented x3. Sensory and motor grossly intact.    Pertinent Diagnostic Tests:  All lab results last 24 hours:    Recent Results (from the past 24 hour(s))   Comprehensive metabolic panel    Collection Time: 03/19/22  2:56 PM   Result Value Ref Range    Sodium 136 135 - 145 mmol/L    Potassium 5.4 (H) 3.4 - 4.8 mmol/L    Chloride 106 98 - 107 mmol/L    CO2 22.0 20.0 - 31.0 mmol/L    Anion Gap 8 5 - 14 mmol/L    BUN 35 (H) 9 - 23 mg/dL    Creatinine 1.61 (H) 0.60 - 1.10 mg/dL  BUN/Creatinine Ratio 15     eGFR CKD-EPI (2021) Male 32 (L) >=60 mL/min/1.43m2    Glucose 300 (H) 70 - 179 mg/dL    Calcium 9.3 8.7 - 56.2 mg/dL    Albumin 4.1 3.4 - 5.0 g/dL    Total Protein 6.8 5.7 - 8.2 g/dL    Total Bilirubin 0.4 0.3 - 1.2 mg/dL    AST 42 (H) <=13 U/L    ALT 204 (H) 10 - 49 U/L    Alkaline Phosphatase 226 (H) 46 - 116 U/L   CBC w/ Differential    Collection Time: 03/19/22  2:56 PM   Result Value Ref Range    WBC 6.6 3.6 - 11.2 10*9/L    RBC 2.97 (L) 4.26 - 5.60 10*12/L    HGB 9.7 (L) 12.9 - 16.5 g/dL    HCT 08.6 (L) 57.8 - 48.0 %    MCV 93.2 77.6 - 95.7 fL    MCH 32.7 (H) 25.9 - 32.4 pg    MCHC 35.1 32.0 - 36.0 g/dL    RDW 46.9 (H) 62.9 - 15.2 %    MPV      Platelet 182 150 - 450 10*9/L   Protime-INR    Collection Time: 03/19/22  2:57 PM   Result Value Ref Range    PT 12.4 9.8 - 12.8 sec    INR 1.09        Imaging:  No results found.    Idalia Needle, MD  PGY-3 General Surgery

## 2022-03-19 NOTE — Unmapped (Addendum)
Patient's labs from 8/7 resulted today with significant bump in lfts. Spoke to Dr.Chen, who recommended patient be seen in the ED for evaluation for Stat Liver Doppler.Marland Kitchen Spoke to patient, who reports he is feeling fine. He denies sx of illness/infection or starting any new medication. He agreed to discuss with his wife and come to [facility] and will notify [facility] with any delays.     Patient returned call again to inquire if this could be related to his right wrist pain. Provided several explanations for potential rise in lfts and explained that only evaluation at the hospital will give [place] more information. He again verbalized understanding.

## 2022-03-20 DIAGNOSIS — Z944 Liver transplant status: Principal | ICD-10-CM

## 2022-03-20 DIAGNOSIS — Z796 Long-term use of immunosuppressant medication: Principal | ICD-10-CM

## 2022-03-20 LAB — GAMMA GT
GAMMA GLUTAMYL TRANSFERASE: 155 IU/L — ABNORMAL HIGH (ref 0–65)
GAMMA GLUTAMYL TRANSFERASE: 132 U/L — ABNORMAL HIGH

## 2022-03-20 LAB — CBC W/ DIFFERENTIAL
BANDED NEUTROPHILS ABSOLUTE COUNT: 0.1 10*3/uL (ref 0.0–0.1)
BASOPHILS ABSOLUTE COUNT: 0.1 10*3/uL (ref 0.0–0.2)
BASOPHILS RELATIVE PERCENT: 1 %
EOSINOPHILS ABSOLUTE COUNT: 0.1 10*3/uL (ref 0.0–0.4)
EOSINOPHILS RELATIVE PERCENT: 2 %
HEMATOCRIT: 27.8 % — ABNORMAL LOW (ref 37.5–51.0)
HEMOGLOBIN: 9.3 g/dL — ABNORMAL LOW (ref 13.0–17.7)
IMMATURE GRANULOCYTES: 1 %
LYMPHOCYTES ABSOLUTE COUNT: 1.3 10*3/uL (ref 0.7–3.1)
LYMPHOCYTES RELATIVE PERCENT: 19 %
MEAN CORPUSCULAR HEMOGLOBIN CONC: 33.5 g/dL (ref 31.5–35.7)
MEAN CORPUSCULAR HEMOGLOBIN: 29.8 pg (ref 26.6–33.0)
MEAN CORPUSCULAR VOLUME: 89 fL (ref 79–97)
MONOCYTES ABSOLUTE COUNT: 0.4 10*3/uL (ref 0.1–0.9)
MONOCYTES RELATIVE PERCENT: 6 %
NEUTROPHILS ABSOLUTE COUNT: 5.1 10*3/uL (ref 1.4–7.0)
NEUTROPHILS RELATIVE PERCENT: 71 %
PLATELET COUNT: 218 10*3/uL (ref 150–450)
RED BLOOD CELL COUNT: 3.12 x10E6/uL — ABNORMAL LOW (ref 4.14–5.80)
RED CELL DISTRIBUTION WIDTH: 14.4 % (ref 11.6–15.4)
WHITE BLOOD CELL COUNT: 7 10*3/uL (ref 3.4–10.8)

## 2022-03-20 LAB — HEPATIC FUNCTION PANEL
ALBUMIN: 3.6 g/dL (ref 3.4–5.0)
ALKALINE PHOSPHATASE: 205 U/L — ABNORMAL HIGH (ref 46–116)
ALT (SGPT): 143 U/L — ABNORMAL HIGH (ref 10–49)
AST (SGOT): 21 U/L (ref <=34)
BILIRUBIN DIRECT: 0.2 mg/dL (ref 0.00–0.30)
BILIRUBIN TOTAL: 0.3 mg/dL (ref 0.3–1.2)
PROTEIN TOTAL: 6.2 g/dL (ref 5.7–8.2)

## 2022-03-20 LAB — CBC W/ AUTO DIFF
BASOPHILS ABSOLUTE COUNT: 0.1 10*9/L (ref 0.0–0.1)
BASOPHILS RELATIVE PERCENT: 1.1 %
EOSINOPHILS ABSOLUTE COUNT: 0.1 10*9/L (ref 0.0–0.5)
EOSINOPHILS RELATIVE PERCENT: 1.9 %
HEMATOCRIT: 25.6 % — ABNORMAL LOW (ref 39.0–48.0)
HEMOGLOBIN: 8.9 g/dL — ABNORMAL LOW (ref 12.9–16.5)
LYMPHOCYTES ABSOLUTE COUNT: 1.1 10*9/L (ref 1.1–3.6)
LYMPHOCYTES RELATIVE PERCENT: 15.9 %
MEAN CORPUSCULAR HEMOGLOBIN CONC: 34.6 g/dL (ref 32.0–36.0)
MEAN CORPUSCULAR HEMOGLOBIN: 29.7 pg (ref 25.9–32.4)
MEAN CORPUSCULAR VOLUME: 85.9 fL (ref 77.6–95.7)
MEAN PLATELET VOLUME: 9.3 fL (ref 6.8–10.7)
MONOCYTES ABSOLUTE COUNT: 0.4 10*9/L (ref 0.3–0.8)
MONOCYTES RELATIVE PERCENT: 6.2 %
NEUTROPHILS ABSOLUTE COUNT: 5.2 10*9/L (ref 1.8–7.8)
NEUTROPHILS RELATIVE PERCENT: 74.9 %
PLATELET COUNT: 188 10*9/L (ref 150–450)
RED BLOOD CELL COUNT: 2.98 10*12/L — ABNORMAL LOW (ref 4.26–5.60)
RED CELL DISTRIBUTION WIDTH: 15.3 % — ABNORMAL HIGH (ref 12.2–15.2)
WBC ADJUSTED: 6.9 10*9/L (ref 3.6–11.2)

## 2022-03-20 LAB — TACROLIMUS LEVEL, TROUGH: TACROLIMUS, TROUGH: 11.4 ng/mL (ref 5.0–15.0)

## 2022-03-20 LAB — COMPREHENSIVE METABOLIC PANEL
A/G RATIO: 1.8 (ref 1.2–2.2)
ALBUMIN: 4.2 g/dL (ref 3.9–4.9)
ALKALINE PHOSPHATASE: 247 IU/L — ABNORMAL HIGH (ref 44–121)
ALT (SGPT): 194 IU/L — ABNORMAL HIGH (ref 0–44)
AST (SGOT): 49 IU/L — ABNORMAL HIGH (ref 0–40)
BILIRUBIN TOTAL (MG/DL) IN SER/PLAS: 0.3 mg/dL (ref 0.0–1.2)
BLOOD UREA NITROGEN: 40 mg/dL — ABNORMAL HIGH (ref 8–27)
BUN / CREAT RATIO: 19 (ref 10–24)
CALCIUM: 9.6 mg/dL (ref 8.6–10.2)
CHLORIDE: 105 mmol/L (ref 96–106)
CO2: 19 mmol/L — ABNORMAL LOW (ref 20–29)
CREATININE: 2.13 mg/dL — ABNORMAL HIGH (ref 0.76–1.27)
EGFR: 34 mL/min/{1.73_m2} — ABNORMAL LOW
GLOBULIN, TOTAL: 2.4 g/dL (ref 1.5–4.5)
GLUCOSE: 195 mg/dL — ABNORMAL HIGH (ref 70–99)
POTASSIUM: 5.5 mmol/L — ABNORMAL HIGH (ref 3.5–5.2)
SODIUM: 140 mmol/L (ref 134–144)
TOTAL PROTEIN: 6.6 g/dL (ref 6.0–8.5)

## 2022-03-20 LAB — PROTIME-INR
INR: 1.03
PROTIME: 11.7 s (ref 9.8–12.8)

## 2022-03-20 LAB — BASIC METABOLIC PANEL
ANION GAP: 10 mmol/L (ref 5–14)
BLOOD UREA NITROGEN: 40 mg/dL — ABNORMAL HIGH (ref 9–23)
BUN / CREAT RATIO: 19
CALCIUM: 8.9 mg/dL (ref 8.7–10.4)
CHLORIDE: 107 mmol/L (ref 98–107)
CO2: 22 mmol/L (ref 20.0–31.0)
CREATININE: 2.09 mg/dL — ABNORMAL HIGH
EGFR CKD-EPI (2021) MALE: 35 mL/min/{1.73_m2} — ABNORMAL LOW (ref >=60)
GLUCOSE RANDOM: 249 mg/dL — ABNORMAL HIGH (ref 70–179)
POTASSIUM: 5.1 mmol/L — ABNORMAL HIGH (ref 3.4–4.8)
SODIUM: 139 mmol/L (ref 135–145)

## 2022-03-20 LAB — PHOSPHORUS
PHOSPHORUS, SERUM: 4.1 mg/dL (ref 2.8–4.1)
PHOSPHORUS: 4.2 mg/dL (ref 2.4–5.1)

## 2022-03-20 LAB — MAGNESIUM
MAGNESIUM: 1.2 mg/dL — ABNORMAL LOW (ref 1.6–2.3)
MAGNESIUM: 1.2 mg/dL — ABNORMAL LOW (ref 1.6–2.6)

## 2022-03-20 LAB — BILIRUBIN, DIRECT: BILIRUBIN DIRECT: 0.15 mg/dL (ref 0.00–0.40)

## 2022-03-20 MED ORDER — ATORVASTATIN 20 MG TABLET
ORAL_TABLET | Freq: Every day | ORAL | 0 refills | 30 days | Status: CP
Start: 2022-03-20 — End: 2022-04-19
  Filled 2022-03-29: qty 30, 30d supply, fill #0

## 2022-03-20 MED ADMIN — valGANciclovir (VALCYTE) tablet 450 mg: 450 mg | ORAL | @ 13:00:00

## 2022-03-20 MED ADMIN — sodium chloride (NS) 0.9 % infusion: 75 mL/h | INTRAVENOUS | @ 02:00:00

## 2022-03-20 MED ADMIN — magnesium oxide-Mg AA chelate (Magnesium Plus Protein) 1 tablet: 1 | ORAL | @ 13:00:00

## 2022-03-20 MED ADMIN — gabapentin (NEURONTIN) capsule 300 mg: 300 mg | ORAL | @ 19:00:00

## 2022-03-20 MED ADMIN — tacrolimus (PROGRAF) capsule 8 mg: 8 mg | ORAL | @ 13:00:00

## 2022-03-20 MED ADMIN — mycophenolate (CELLCEPT) capsule 750 mg: 750 mg | ORAL | @ 13:00:00

## 2022-03-20 MED ADMIN — docusate sodium (COLACE) capsule 100 mg: 100 mg | ORAL | @ 02:00:00

## 2022-03-20 MED ADMIN — hydrOXYzine (ATARAX) tablet 25 mg: 25 mg | ORAL | @ 19:00:00

## 2022-03-20 MED ADMIN — gabapentin (NEURONTIN) capsule 300 mg: 300 mg | ORAL | @ 09:00:00

## 2022-03-20 MED ADMIN — pantoprazole (PROTONIX) EC tablet 40 mg: 40 mg | ORAL | @ 13:00:00

## 2022-03-20 MED ADMIN — hydrOXYzine (ATARAX) tablet 25 mg: 25 mg | ORAL | @ 13:00:00

## 2022-03-20 MED ADMIN — magnesium sulfate 2gm/50mL IVPB: 2 g | INTRAVENOUS | @ 14:00:00 | Stop: 2022-03-20

## 2022-03-20 MED ADMIN — gabapentin (NEURONTIN) capsule 300 mg: 300 mg | ORAL | @ 13:00:00

## 2022-03-20 MED ADMIN — sulfamethoxazole-trimethoprim (BACTRIM) 400-80 mg tablet 80 mg of trimethoprim: 1 | ORAL | @ 13:00:00 | Stop: 2022-03-27

## 2022-03-20 MED ADMIN — acetaminophen (TYLENOL) tablet 650 mg: 650 mg | ORAL | @ 09:00:00

## 2022-03-20 MED ADMIN — mycophenolate (CELLCEPT) capsule 750 mg: 750 mg | ORAL | @ 02:00:00

## 2022-03-20 MED ADMIN — hydrOXYzine (ATARAX) tablet 25 mg: 25 mg | ORAL | @ 02:00:00

## 2022-03-20 MED ADMIN — tacrolimus (PROGRAF) capsule 8 mg: 8 mg | ORAL | @ 02:00:00

## 2022-03-20 MED ADMIN — melatonin tablet 3 mg: 3 mg | ORAL | @ 02:00:00

## 2022-03-20 NOTE — Unmapped (Signed)
Discharge Summary    Admit date: 03/19/2022    Discharge date and time: 03/20/22    Discharge to:  Home    Discharge Service: Surg Transplant [facility])    Discharge Attending Physician: [name], *    Discharge  Diagnoses: liver transplant    Secondary Diagnosis: Active Problems:    * No active hospital problems. *  Resolved Problems:    * No resolved hospital problems. *      OR Procedures:  n/a     Ancillary Procedures: no procedures    Discharge Day Services:     Subjective   NAEO. Patient remains afebrile, hemodynamically stable. LFTs continue to improve. Patient feels well overall.    Objective   Patient Vitals for the past 8 hrs:   BP Temp Temp src Pulse Resp SpO2   03/20/22 0900 145/93 36.3 ??C (97.3 ??F) Oral 75 18 100 %     No intake/output data recorded.    General Appearance:   No acute distress  Lungs:                Breathing comfortably on room air  Abdomen:                Soft, non-tender, non-distended, surgical incision site c/d/i  Extremities:              Warm and well perfused    Hospital Course:  Matthew Key is a 61 y.o. 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     Hospital course was uncomplicated. He was evaluated with a liver doppler ultrasound which showed patent veins and arteries. Labs were obtained and showed improvement in LFTs.      At the time of discharge the patient was voiding, tolerating a regular diet , was ambulatory, and reported good pain control with PO medications. The patient will be discharged home on  LOS: 0 days    in good condition.       Condition at Discharge: Improved  Discharge Medications:      Medication List      CHANGE how you take these medications     atorvastatin 20 MG tablet; Commonly known as: LIPITOR; Take 1 tablet (20   mg total) by mouth daily. Hold until directed to take by nurse   coordinator; What changed: additional instructions CONTINUE taking these medications     ACCU-CHEK GUIDE GLUCOSE METER Misc; Generic drug: blood-glucose meter;   Use to test blood sugar as directed   ACCU-CHEK GUIDE TEST STRIPS Strp; Generic drug: blood sugar diagnostic;   Use to check blood sugar as directed with insulin 3 times a day & for   symptoms of high or low blood sugar   ACCU-CHEK SOFTCLIX LANCETS Misc; Generic drug: lancets; Use to check   blood sugar as directed with insulin 3 times a day & for symptoms of high   or low blood sugar.   acetaminophen 325 MG tablet; Commonly known as: TYLENOL; Take 1-2   tablets (325-650 mg total) by mouth every six (6) hours as needed for pain   or fever.   aspirin 81 MG tablet; Commonly known as: ECOTRIN; Take 1 tablet (81 mg   total) by mouth daily.   BAQSIMI 3 mg/actuation Spry; Generic drug: glucagon spray; Use 1 spray   into the left nostril every fifteen (15) minutes as needed (low blood  sugar).   docusate sodium 100 MG capsule; Commonly known as: COLACE; Take 1   capsule (100 mg total) by mouth two (2) times a day as needed for   constipation.   gabapentin 300 MG capsule; Commonly known as: NEURONTIN; Take 1 capsule   (300 mg total) by mouth Three (3) times a day.   hydrOXYzine 50 MG capsule; Commonly known as: VISTARIL; Take 1 capsule   (50 mg total) by mouth nightly as needed for anxiety.   insulin ASPART 100 unit/mL (3 mL) injection pen; Commonly known as:   NovoLOG FLEXPEN; Inject under the skin per sliding scale prior to meals.   If premeal BG 151-200 take 2 additional units for correction, if 201-250   take 4 additional units, etc. Max dose 36 units per day   insulin degludec 100 unit/mL (3 mL) Inpn; Commonly known as: TRESIBA   FLEXTOUCH U-100; Inject 0.15 mL (15 Units total) under the skin daily.   Adjust as instructed.   magnesium (amino acid chelate) 133 mg; Generic drug: magnesium oxide-Mg   AA chelate; Take 1 tablet by mouth two (2) times a day.   melatonin 3 mg Tab; Take 1 tablet (3 mg total) by mouth every evening.   methocarbamoL 500 MG tablet; Commonly known as: ROBAXIN; Take 1 tablet   (500 mg total) by mouth Three (3) times a day as needed.   mycophenolate 250 mg capsule; Commonly known as: CELLCEPT; Take 3   capsules (750 mg total) by mouth Two (2) times a day.   pantoprazole 40 MG tablet; Commonly known as: PROTONIX; Take 1 tablet   (40 mg total) by mouth daily.   sulfamethoxazole-trimethoprim 400-80 mg per tablet; Commonly known as:   BACTRIM; Take 1 tablet (80 mg of trimethoprim total) by mouth 3 (three)   times a week.   tacrolimus 1 MG capsule; Commonly known as: PROGRAF; Take 8 capsules (8   mg total) by mouth two (2) times a day.   UNIFINE PENTIPS 32 gauge x 5/32 (4 mm) Ndle; Generic drug: pen needle,   diabetic; Use as directed  1-2 times per day   valGANciclovir 450 mg tablet; Commonly known as: VALCYTE; Take 1 tablet   (450 mg total) by mouth daily.       Pending Test Results:     Discharge Instructions:  Activity:     Diet:    Other Instructions:  Other Instructions       Discharge instructions      Discharge Instructions:    Diet: regular    Your Liver Post-Transplant Coordinator is Emilio Math. Contact your transplant coordinator or the Transplant Surgery Office [phone]) during business hours or page the transplant coordinator on call [phone]) after business hours for:    - fever >100.5 degrees F by mouth, any fever with shaking chills, or other signs or symptoms of infection   - uncontrolled nausea, vomiting, or diarrhea; inability to have a bowel movement for > 3 days.   - any problem that prevents taking medications as scheduled.   - pain uncontrolled with prescribed medication or new pain or tenderness at the surgical site   - sudden weight gain or increase in blood pressure (greater than 140/85)   - shortness of breath, chest pain / discomfort   - new or increasing jaundice   - urinary symptoms including pain / difficulty / burning or tea-colored urine   - any other new or concerning symptoms   - questions regarding your medications  or continuing care    Please continue your wound care the same as you did prior to being hospitalized.     Do not drive or operate heavy machinery prior to MD clearance, or at any time while taking narcotics.    Contact Transplant Coordinator for spreading redness, purulent discharge, or increasing bleeding or drainage, or for separation of wounds.     Maintain a written record of daily vital signs, per Handbook instructions.     Maintain a written record of medications taken and review against the discharge medications sheet.     Labs and Other Follow-ups after Discharge:   Please plan to obtain labs this Friday 03/22/22. Keep all your other appointments the same as before the hospitalization.    Liver Post-Transplant Coordinator:  Emilio Math- phone: [phone] fax: [phone]          Labs and Other Follow-ups after Discharge:      Future Appointments:  Appointments which have been scheduled for you      Mar 21, 2022  8:00 AM  (Arrive by 7:30 AM)  ELECTROMYOGRAPHY with Anahit Cheluskin Lanora Manis, MD, EMG ROOM 1 ([place])  [place] [facility] EMG [facility] REGION) [address]  [place] [address]  [phone]        Mar 21, 2022 11:00 AM  (Arrive by 10:30 AM)  LAB ONLY with LAB PHLEB GRND UNCW  LAB PHLEB GRND FLR [employer] [facility] REGION) [address]  [place] [address]  [phone]        Apr 08, 2022  8:30 AM  (Arrive by 8:00 AM)  RETURN SURGERY with Florence Canner, FNP  [facility] TRANSPLANT SURGERY [place] [facility] REGION) [address]  [place] [address]  [phone]        Apr 08, 2022  9:30 AM  (Arrive by 9:00 AM)  RETURN PHARMD with TRANSPLANT SURGERY PHARMACY  [facility] TRANSPLANT SURGERY [facility] REGION) [address]  [place] [address]  [phone]        May 06, 2022 10:00 AM  (Arrive by 9:30 AM)  LAB ONLY with LAB PHLEB GRND UNCW  LAB PHLEB GRND FLR [employer] [facility] REGION) [address]  [place] [address]  [phone]        May 06, 2022 10:30 AM  (Arrive by 10:00 AM)  RETURN PHARMD with TRANSPLANT SURGERY PHARMACY  [facility] TRANSPLANT SURGERY [facility] REGION) [address]  [place] [address]  [phone]        May 06, 2022 12:00 PM  (Arrive by 11:30 AM)  RETURN 15 with Chirag Lanney Gins, MD  [facility] TRANSPLANT SURGERY [place] [facility] REGION) [address]  [place] [address]  [phone]        May 07, 2022 10:00 AM  (Arrive by 9:30 AM)  RETURN VIDEO HCP MYCHART with Jackolyn Confer, RD/LDN  [facility] NUTRITION SERVICES TRANSPLANT [place] [facility] REGION) [address]  [place] [address]  [phone]   Please sign into My [place] Chart at least 15 minutes before your appointment to complete the eCheck-In process. You must complete eCheck-In before you can start your video visit. We also recommend testing your audio and video connection to troubleshoot any issues before your visit begins. Click ???Join Video Visit??? to complete these checks. Once you have completed eCheck-In and tested your  audio and video, click ???Join Call??? to connect to your visit.     For your video visit, you will need a computer with a working camera, speaker and microphone, a smartphone, or a tablet with internet access.    My [place] Chart enables you to manage your health, send non-urgent messages to your provider, view your test results, schedule and manage appointments, and request prescription refills securely and conveniently from your computer or mobile device.    You can go to [email or web address] to sign in to your My Ava Chart account with your username and password. If you have forgotten your username or password, please choose the ???Forgot Username???? and/or ???Forgot Password???? links to gain access. You also can access your My [place] Chart account with the free MyChart mobile app for Android or iPhone.    If you need assistance accessing your My [place] Chart account or for assistance in reaching your provider's office to reschedule or cancel your appointment, please call [facility] [phone].         May 07, 2022 11:00 AM  (Arrive by 10:30 AM)  RETURN VIDEO HCP MYCHART with Smiley Houseman  [facility] LIVER TRANSPLANT [place] [facility] REGION) [address]  [place] [address]  [phone]   Please sign into My [place] Chart at least 15 minutes before your appointment to complete the eCheck-In process. You must complete eCheck-In before you can start your video visit. We also recommend testing your audio and video connection to troubleshoot any issues before your visit begins. Click ???Join Video Visit??? to complete these checks. Once you have completed eCheck-In and tested your audio and video, click ???Join Call??? to connect to your visit.     For your video visit, you will need a computer with a working camera, speaker and microphone, a smartphone, or a tablet with internet access.    My [place] Chart enables you to manage your health, send non-urgent messages to your provider, view your test results, schedule and manage appointments, and request prescription refills securely and conveniently from your computer or mobile device.    You can go to [email or web address] to sign in to your My [place] Chart account with your username and password. If you have forgotten your username or password, please choose the ???Forgot Username???? and/or ???Forgot Password???? links to gain access. You also can access your My Alden Chart account with the free MyChart mobile app for Android or iPhone.    If you need assistance accessing your My Uri[place] Chart account or for assistance in reaching your provider's office to reschedule or cancel your appointment, please call [facility] [phone]. Jul 17, 2022 11:30 AM  (Arrive by 11:00 AM)  RETURN VIDEO HCP DIRECT LINK with Jake Bathe Diket, PsyD  [facility] TRANSPLANT SURGERY [place])  Arrive at: This is a Video Visit [address]  [place] [address]  [phone]   A direct link will be sent to you by your provider at the time of your video appointment. Please do NOT go to the clinic.     For your video visit, you will need a computer with a working camera, speaker and microphone, a smartphone, or a tablet with internet access.         Jul 30, 2022 10:30 AM  (Arrive by 10:00 AM)  RETURN  HEPATOLOGY with Janyth Pupa, MD  [facility] LIVER TRANSPLANT [place]) [address]  [place] [address]  [phone]        Jul 31, 2022 11:30 AM  (Arrive by 11:00 AM)  RETURN VIDEO HCP MYCHART with Jackolyn Confer, RD/LDN  [facility] NUTRITION SERVICES TRANSPLANT [place] [facility] REGION) [address]  [place] [address]  [phone]   Please sign into My [place] Chart at least 15 minutes before your appointment to complete the eCheck-In process. You must complete eCheck-In before you can start your video visit. We also recommend testing your audio and video connection to troubleshoot any issues before your visit begins. Click ???Join Video Visit??? to complete these checks. Once you have completed eCheck-In and tested your audio and video, click ???Join Call??? to connect to your visit.     For your video visit, you will need a computer with a working camera, speaker and microphone, a smartphone, or a tablet with internet access.    My [place] Chart enables you to manage your health, send non-urgent messages to your provider, view your test results, schedule and manage appointments, and request prescription refills securely and conveniently from your computer or mobile device.    You can go to [email or web address] to sign in to your My [place] Chart account with your username and password. If you have forgotten your username or password, please choose the ???Forgot Username???? and/or ???Forgot Password???? links to gain access. You also can access your My [place] Chart account with the free MyChart mobile app for Android or iPhone.    If you need assistance accessing your My Am[place] Chart account or for assistance in reaching your provider's office to reschedule or cancel your appointment, please call [facility] [phone].         Aug 01, 2022  2:00 PM  (Arrive by 1:30 PM)  RETURN VIDEO HCP MYCHART with TRANSPLANT SURGERY PHARMACY  [facility] TRANSPLANT SURGERY  [facility] REGION) [address]  [place] [address]  [phone]   Please sign into My [place] Chart at least 15 minutes before your appointment to complete the eCheck-In process. You must complete eCheck-In before you can start your video visit. We also recommend testing your audio and video connection to troubleshoot any issues before your visit begins. Click ???Join Video Visit??? to complete these checks. Once you have completed eCheck-In and tested your audio and video, click ???Join Call??? to connect to your visit.     For your video visit, you will need a computer with a working camera, speaker and microphone, a smartphone, or a tablet with internet access.    My [place] Chart enables you to manage your health, send non-urgent messages to your provider, view your test results, schedule and manage appointments, and request prescription refills securely and conveniently from your computer or mobile device.    You can go to [email or web address] to sign in to your My Clifford Chart account with your username and password. If you have forgotten your username or password, please choose the ???Forgot Username???? and/or ???Forgot Password???? links to gain access. You also can access your My [place] Chart account with the free MyChart mobile app for Android or iPhone.    If you need assistance accessing your My [place] Chart account or for assistance in reaching your provider's office to reschedule or cancel your appointment, please call [facility] [phone].         Nov 05, 2022 10:30 AM  (  Arrive by 10:00 AM)  RETURN  HEPATOLOGY with Janyth Pupa, MD  [facility] LIVER TRANSPLANT [facility] REGION) [address]  [place] [address]  [phone]        Nov 07, 2022  2:00 PM  (Arrive by 1:30 PM)  RETURN VIDEO HCP MYCHART with TRANSPLANT SURGERY PHARMACY  [facility] TRANSPLANT SURGERY [facility] REGION) [address]  [place] [address]  [phone]   Please sign into My Kin[place] Chart at least 15 minutes before your appointment to complete the eCheck-In process. You must complete eCheck-In before you can start your video visit. We also recommend testing your audio and video connection to troubleshoot any issues before your visit begins. Click ???Join Video Visit??? to complete these checks. Once you have completed eCheck-In and tested your audio and video, click ???Join Call??? to connect to your visit.     For your video visit, you will need a computer with a working camera, speaker and microphone, a smartphone, or a tablet with internet access.    My [place] Chart enables you to manage your health, send non-urgent messages to your provider, view your test results, schedule and manage appointments, and request prescription refills securely and conveniently from your computer or mobile device.    You can go to [email or web address] to sign in to your My [place] Chart account with your username and password. If you have forgotten your username or password, please choose the ???Forgot Username???? and/or ???Forgot Password???? links to gain access. You also can access your My D[place] Chart account with the free MyChart mobile app for Android or iPhone.    If you need assistance accessing your My [place] Chart account or for assistance in reaching your provider's office to reschedule or cancel your appointment, please call [facility] [phone].         Feb 04, 2023  8:30 AM  (Arrive by 8:00 AM)  XR DEXA BONE DENSITY SKELETAL with Doran Durand RM 1  IMG DEXA [facility]) [address]  [place] [address]  [phone]   No calcium supplements 24 hrs prior.         Feb 04, 2023 10:00 AM  (Arrive by 9:30 AM)  RETURN  HEPATOLOGY with Janyth Pupa, MD  [facility] LIVER TRANSPLANT [facility] REGION) [address]  [place] [address]  [phone]        Feb 06, 2023  8:30 AM  (Arrive by 8:00 AM)  RETURN VIDEO HCP MYCHART with TRANSPLANT SURGERY PHARMACY  [facility] TRANSPLANT SURGERY [place] [facility] REGION) [address]  [place] [address]  [phone]   Please sign into My [place] Chart at least 15 minutes before your appointment to complete the eCheck-In process. You must complete eCheck-In before you can start your video visit. We also recommend testing your audio and video connection to troubleshoot any issues before your visit begins. Click ???Join Video Visit??? to complete these checks. Once you have completed eCheck-In and tested your audio and video, click ???Join Call??? to connect to your visit.     For your video visit, you will need a computer with a working camera, speaker and microphone, a smartphone, or a tablet with internet access.    My [place] Chart enables you to manage your health, send non-urgent messages to your provider, view your test results, schedule and manage appointments, and request prescription  refills securely and conveniently from your computer or mobile device.    You can go to [email or web address] to sign in to your My [place] Chart account with your username and password. If you have forgotten your username or password, please choose the ???Forgot Username???? and/or ???Forgot Password???? links to gain access. You also can access your My [place] Chart account with the free MyChart mobile app for Android or iPhone.    If you need assistance accessing your My [place] Chart account or for assistance in reaching your provider's office to reschedule or cancel your appointment, please call [facility] [phone].         Feb 06, 2023 10:00 AM  (Arrive by 9:30 AM)  RETURN VIDEO HCP MYCHART with Jackolyn Confer, RD/LDN  [facility] NUTRITION SERVICES TRANSPLANT [place] [facility] REGION) [address]  [place] [address]  [phone]   Please sign into My [place] Chart at least 15 minutes before your appointment to complete the eCheck-In process. You must complete eCheck-In before you can start your video visit. We also recommend testing your audio and video connection to troubleshoot any issues before your visit begins. Click ???Join Video Visit??? to complete these checks. Once you have completed eCheck-In and tested your audio and video, click ???Join Call??? to connect to your visit.     For your video visit, you will need a computer with a working camera, speaker and microphone, a smartphone, or a tablet with internet access.    My [place] Chart enables you to manage your health, send non-urgent messages to your provider, view your test results, schedule and manage appointments, and request prescription refills securely and conveniently from your computer or mobile device.    You can go to [email or web address] to sign in to your My [place] Chart account with your username and password. If you have forgotten your username or password, please choose the ???Forgot Username???? and/or ???Forgot Password???? links to gain access. You also can access your My [place] Chart account with the free MyChart mobile app for Android or iPhone.    If you need assistance accessing your My [place] Chart account or for assistance in reaching your provider's office to reschedule or cancel your appointment, please call [facility] [phone].         Feb 07, 2023  9:00 AM  (Arrive by 8:30 AM)  RETURN VIDEO HCP MYCHART with Smiley Houseman  [facility] LIVER TRANSPLANT [facility] REGION) [address]  [place] [address]  [phone]   Please sign into My [place] Chart at least 15 minutes before your appointment to complete the eCheck-In process. You must complete eCheck-In before you can start your video visit. We also recommend testing your audio and video connection to troubleshoot any issues before your visit begins. Click ???Join Video Visit??? to complete these checks. Once you have completed eCheck-In and tested your audio and video, click ???Join Call??? to connect to your visit.     For your video visit, you will need a computer with a working camera, speaker and microphone, a smartphone, or a tablet with internet access.    My [place] Chart enables you to manage your health, send non-urgent messages to your provider, view your test results, schedule and manage appointments, and request prescription refills securely and conveniently from your computer or mobile device.    You can go to [email or web address] to sign in to your My [employer]  Chart account with your username and password. If you have forgotten your username or password, please choose the ???Forgot Username???? and/or ???Forgot Password???? links to gain access. You also can access your My [place] Chart account with the free MyChart mobile app for Android or iPhone.    If you need assistance accessing your My [place] Chart account or for assistance in reaching your provider's office to reschedule or cancel your appointment, please call [facility] [phone].         Feb 07, 2023 11:30 AM  (Arrive by 11:00 AM)  RETURN VIDEO HCP MYCHART with Alvester Chou, PsyD  [facility] TRANSPLANT SURGERY [facility] REGION) [address]  [place] [address]  [phone]   Please sign into My [place] Chart at least 15 minutes before your appointment to complete the eCheck-In process. You must complete eCheck-In before you can start your video visit. We also recommend testing your audio and video connection to troubleshoot any issues before your visit begins. Click ???Join Video Visit??? to complete these checks. Once you have completed eCheck-In and tested your audio and video, click ???Join Call??? to connect to your visit.     For your video visit, you will need a computer with a working camera, speaker and microphone, a smartphone, or a tablet with internet access.    My [place] Chart enables you to manage your health, send non-urgent messages to your provider, view your test results, schedule and manage appointments, and request prescription refills securely and conveniently from your computer or mobile device.    You can go to [email or web address] to sign in to your My [place] Chart account with your username and password. If you have forgotten your username or password, please choose the ???Forgot Username???? and/or ???Forgot Password???? links to gain access. You also can access your My [place] Chart account with the free MyChart mobile app for Android or iPhone.    If you need assistance accessing your My  Chart account or for assistance in reaching your provider's office to reschedule or cancel your appointment, please call [facility] [phone].

## 2022-03-20 NOTE — Unmapped (Addendum)
Patient's repeat tac from early this morning at 11.4 and last week's level at 11.2. [facility] Tac level from 8/8 still pending. Creatinine is elevated above baseline as well. Discussed with Dr. Imogene Burn, who felt this morning's level did not represent an accurate trough, since it was drawn early and denied need for any dose adjustment.Plan for patient to be discharged today, lfts improving, but no definitive cause for bump in lfts.    Patient contacted TNC from ED to request assistance with contacting insurance transportation contractor to confirm patient is being discharged from hospital today, which would make him eligible to waive the waiting period for a ride home now. Discussed with SW who contacted the insurance co.

## 2022-03-20 NOTE — Unmapped (Signed)
Tacrolimus Therapeutic Monitoring Pharmacy Note    Iliyan Mould is a 61 y.o. male continuing tacrolimus.     Indication: Liver transplant     Date of Transplant:  02/06/22       Prior Dosing Information: Home regimen 8 mg BID      Goals:  Therapeutic Drug Levels  Tacrolimus trough goal: 8-10 ng/mL    Additional Clinical Monitoring/Outcomes  Monitor renal function (SCr and urine output) and liver function (LFTs)  Monitor for signs/symptoms of adverse events (e.g., hyperglycemia, hyperkalemia, hypomagnesemia, hypertension, headache, tremor)    Results:   Tacrolimus level:  11.4 ng/mL, drawn early, ~7 hr level    Pharmacokinetic Considerations and Significant Drug Interactions:  Concurrent hepatotoxic medications: none  Concurrent CYP3A4 substrates/inhibitors: none  Concurrent nephrotoxic medications: None identified    Assessment/Plan:  Recommendedation(s)  Continue current regimen of 8 mg BID    Follow-up  Plan for next level to be drawn with other labs on Friday, 8/11 .   A pharmacist will continue to monitor and recommend levels as appropriate    Please page service pharmacist with questions/clarifications.    [name], PharmD

## 2022-03-20 NOTE — Unmapped (Signed)
Tacrolimus Therapeutic Monitoring Pharmacy Note    Matthew Key is a 61 y.o. male continuing tacrolimus.     Indication: Liver transplant     Date of Transplant:  02/06/22       Prior Dosing Information: Home regimen 8 mg BID      Goals:  Therapeutic Drug Levels  Tacrolimus trough goal: 8-10 ng/mL    Additional Clinical Monitoring/Outcomes  Monitor renal function (SCr and urine output) and liver function (LFTs)  Monitor for signs/symptoms of adverse events (e.g., hyperglycemia, hyperkalemia, hypomagnesemia, hypertension, headache, tremor)    Results:   Tacrolimus level: Not applicable    Pharmacokinetic Considerations and Significant Drug Interactions:  Concurrent hepatotoxic medications:  valcyte  Concurrent CYP3A4 substrates/inhibitors:  atorvastatin  Concurrent nephrotoxic medications: None identified    Assessment/Plan:  Recommendedation(s)  Continue current regimen of 8 mg BID    Follow-up  Next level has been ordered on 03/20/22 at 0400 .   A pharmacist will continue to monitor and recommend levels as appropriate    Please page service pharmacist with questions/clarifications.    Willey Blade, PharmD

## 2022-03-20 NOTE — Unmapped (Signed)
Care Management  Initial Transition Planning Assessment                   Contact/Decision Maker  Extended Emergency Contact Information  Primary Emergency Contact: Matthew Key,Matthew Key  Mobile Phone: [phone]  Relation: Spouse  Secondary Emergency Contact: Matthew Key, Matthew Key  Mobile Phone: [phone]  Relation: Son    Legal Next of Kin / Guardian / POA / Advance Directives     HCDM (patient stated preference): Matthew Key,Matthew Key - Spouse - [phone]    HCDM, back-up (If primary HCDM is unavailable): Matthew Key, Matthew Key - [phone]         Health Care Decision Maker [HCDM] (Medical & Mental Health Treatment)  Information offered on HCDM, Medical & Mental Health advance directives:: Patient given information.         Readmission Information                                     Did the following happen with your discharge?                                                     Patient Information  Lives with:      Type of Residence:               Support Systems/Concerns: Case Research officer, political party, Concern for Caregiver Burnout, Spouse                                            Financial Information               Social Determinants of Health  Social Determinants of Health were addressed in provider documentation.  Please refer to patient history.    Complex Discharge Information    Is patient identified as a difficult/complex discharge?: Yes           Other: s/p liver transplant    Interventions:       Discharge Needs Assessment  Concerns to be Addressed:      Clinical Risk Factors:                     Discharge Facility/Level of Care Needs:      Readmission  Risk of Unplanned Readmission Score:  %  Predictive Model Details   No score data available for [facility] Risk of Unplanned Readmission     Readmitted Within the Last 30 Days? (No if blank)        Discharge Plan       Expected Discharge Date: 03/21/2022    Expected Transfer from Critical Care:

## 2022-03-21 ENCOUNTER — Ambulatory Visit
Admit: 2022-03-21 | Discharge: 2022-03-22 | Payer: MEDICARE | Attending: Student in an Organized Health Care Education/Training Program | Primary: Student in an Organized Health Care Education/Training Program

## 2022-03-21 ENCOUNTER — Ambulatory Visit: Admit: 2022-03-21 | Discharge: 2022-03-22 | Payer: MEDICARE

## 2022-03-21 DIAGNOSIS — Z796 Long-term use of immunosuppressant medication: Principal | ICD-10-CM

## 2022-03-21 DIAGNOSIS — Z944 Liver transplant status: Principal | ICD-10-CM

## 2022-03-21 LAB — CBC W/ AUTO DIFF
BASOPHILS ABSOLUTE COUNT: 0 10*9/L (ref 0.0–0.1)
BASOPHILS RELATIVE PERCENT: 0.6 %
EOSINOPHILS ABSOLUTE COUNT: 0.1 10*9/L (ref 0.0–0.5)
EOSINOPHILS RELATIVE PERCENT: 1.3 %
HEMATOCRIT: 26.2 % — ABNORMAL LOW (ref 39.0–48.0)
HEMOGLOBIN: 9 g/dL — ABNORMAL LOW (ref 12.9–16.5)
LYMPHOCYTES ABSOLUTE COUNT: 1.1 10*9/L (ref 1.1–3.6)
LYMPHOCYTES RELATIVE PERCENT: 13.6 %
MEAN CORPUSCULAR HEMOGLOBIN CONC: 34.3 g/dL (ref 32.0–36.0)
MEAN CORPUSCULAR HEMOGLOBIN: 29.7 pg (ref 25.9–32.4)
MEAN CORPUSCULAR VOLUME: 86.4 fL (ref 77.6–95.7)
MEAN PLATELET VOLUME: 9.7 fL (ref 6.8–10.7)
MONOCYTES ABSOLUTE COUNT: 0.6 10*9/L (ref 0.3–0.8)
MONOCYTES RELATIVE PERCENT: 7.3 %
NEUTROPHILS ABSOLUTE COUNT: 6.1 10*9/L (ref 1.8–7.8)
NEUTROPHILS RELATIVE PERCENT: 77.2 %
PLATELET COUNT: 184 10*9/L (ref 150–450)
RED BLOOD CELL COUNT: 3.03 10*12/L — ABNORMAL LOW (ref 4.26–5.60)
RED CELL DISTRIBUTION WIDTH: 15.6 % — ABNORMAL HIGH (ref 12.2–15.2)
WBC ADJUSTED: 7.8 10*9/L (ref 3.6–11.2)

## 2022-03-21 LAB — TACROLIMUS LEVEL
TACROLIMUS BLOOD: 12.7 ng/mL (ref 2.0–20.0)
TACROLIMUS BLOOD: 8.6 ng/mL

## 2022-03-21 LAB — COMPREHENSIVE METABOLIC PANEL
ALBUMIN: 3.8 g/dL (ref 3.4–5.0)
ALKALINE PHOSPHATASE: 217 U/L — ABNORMAL HIGH (ref 46–116)
ALT (SGPT): 108 U/L — ABNORMAL HIGH (ref 10–49)
ANION GAP: 8 mmol/L (ref 5–14)
AST (SGOT): 22 U/L (ref <=34)
BILIRUBIN TOTAL: 0.3 mg/dL (ref 0.3–1.2)
BLOOD UREA NITROGEN: 41 mg/dL — ABNORMAL HIGH (ref 9–23)
BUN / CREAT RATIO: 20
CALCIUM: 9.4 mg/dL (ref 8.7–10.4)
CHLORIDE: 107 mmol/L (ref 98–107)
CO2: 24 mmol/L (ref 20.0–31.0)
CREATININE: 2.06 mg/dL — ABNORMAL HIGH
EGFR CKD-EPI (2021) MALE: 36 mL/min/{1.73_m2} — ABNORMAL LOW (ref >=60)
GLUCOSE RANDOM: 242 mg/dL — ABNORMAL HIGH (ref 70–99)
POTASSIUM: 5.2 mmol/L — ABNORMAL HIGH (ref 3.4–4.8)
PROTEIN TOTAL: 6.6 g/dL (ref 5.7–8.2)
SODIUM: 139 mmol/L (ref 135–145)

## 2022-03-21 LAB — BILIRUBIN, DIRECT: BILIRUBIN DIRECT: 0.2 mg/dL (ref 0.00–0.30)

## 2022-03-21 LAB — MAGNESIUM: MAGNESIUM: 1.5 mg/dL — ABNORMAL LOW (ref 1.6–2.6)

## 2022-03-21 LAB — PHOSPHORUS: PHOSPHORUS: 4.1 mg/dL (ref 2.4–5.1)

## 2022-03-21 LAB — GAMMA GT: GAMMA GLUTAMYL TRANSFERASE: 122 U/L — ABNORMAL HIGH

## 2022-03-21 NOTE — Unmapped (Signed)
Called pt to follow up on BG as they have been elevated on labs the last couple weeks.  Pt reports that his wife has been writing them down 3 times per day but does not know where she stores the logs.  Pt will find log and be able to review with me tomorrow when I call back.    Hazeline Junker, PharmD, CPP  Abdominal Transplant Clinical Pharmacist Practitioner

## 2022-03-21 NOTE — Unmapped (Signed)
[facility]  Clinical Neurophysiology Laboratory  [place], [place]         Patient: Matthew Key Date of Birth: 1960/12/27  [facility] #: [id number] Handedness: Right  Sex: Male      Visit Date: 03/21/2022 8:57 AM  Age: 61 Years  Attending: Hilbert Bible, MD   Resident/Fellow: Estill Bamberg, MD   Req Provider: Czepiga  Current Height: 5 feet 8 inch  Chief complaint: Right wrist and hand pain  History/Exam: He has liver disease and had liver transplant 5 weeks ago. When he came out of surgery his right hand was numb for 5-6 days before feeling came back. ABout 2 weeks after his surgery he started to have right wrist and hand pain. He also endorses weakness in his hand as well. T2DM for 5-6 years. No chemotherapy. Symmetric 2+ reflexes in the BUE, 1+ in the BLE. Decreased light touch sensation throughout entire right arm compared to left. Normal vibration. He has pain over dorsum of right hand and decreased sensation there compared to other areas of the arm. Only pain limited weakness in the right hand.       Motor NCS      Nerve / Sites Muscle Latency Ref. Amplitude Ref. Dur. Distance Lat Diff Velocity Ref.     ms ms mV mV ms mm ms m/s m/s   R Median - APB      Wrist APB 3.92 ?4.40 9.7 ?4.2 5.25 8         Elbow APB 9.83  9.3  5.69 29 5.92 49.0 ?49.0   R Ulnar - ADM      Wrist ADM 3.79 ?4.20 9.6 ?5.1 5.25 8         B.Elbow ADM 7.98  9.1  5.56 21.5 4.19 51.3 ?49.0      A.Elbow ADM 10.00  9.1  5.73 10.5 2.02 52.0 ?49.0       F  Wave      Nerve F min Ref.    ms ms   R Median - APB NR ?31.0   R Ulnar - ADM 35.7 ?31.0       Sensory NCS      Nerve / Sites Rec. Site Peak Lat Ref. PP Amp Ref. Distance Vel.     ms ms ??V ??V mm m/s   R Median - Dig II (Antidromic)      Wrist Index 3.77 ?3.80 22.8 ?10.0 14 48   L Median - Dig II (Antidromic)      Wrist Index 3.73 ?3.80 27.1 ?10.0 14 51   R Ulnar - Dig V (Antidromic)      Wrist Dig V 3.69 ?3.80 11.5 ?10.0 14 49   L Ulnar - Dig V (Antidromic)      Wrist Dig V 3.56 ?3.80 16.8 ?10.0 14 49   R Radial - Superficial (Antidromic)      Forearm Wrist 2.75 ?2.80 18.2  10 47   L Radial - Superficial (Antidromic)      Forearm Wrist 2.80 ?2.80 16.9  10 46       EMG Summary Table     Spontaneous MUP Recruitment Comment   Muscle Nerve Roots Fib PSW Fasc Other Amp Dur. PPP Pattern Other   R. First dorsal interosseous Ulnar C8-T1 None None None None N N N Normal None   R. Abductor pollicis brevis Median C8-T1 None None None None N N N Normal None   R. Extensor digitorum communis Radial C7-C8 None None None None  N N N Normal None   R. Biceps brachii Musculocutaneous C5-C6 None None None None N N N Normal None   R. Deltoid Axillary C5-C6 None None None None N N N Normal None       Summary:  After identifying the patient in the waiting room and reviewing all appropriately available medical records, the patient was taken back to the examination room where the procedure was explained, the sites of examination were noted, the patient's questions were answered, and the patient's verbal consent for the procedure was obtained. Studies were performed at [facility] using a radiant warmer and CareFusion Synergy EMG system.    Sensory nerve conduction study of the right and left median, ulnar and radial nerves demonstrate normal peak latency and normal SNAP amplitude.    Motor nerve conduction study of the right median and ulnar nerves demonstrate normal distal motor latency, normal CMAP amplitude and normal conduction velocity. Minimal F-wave is absent for the right median and prolonged for the right ulnar nerves.    Needle EMG of selected muscle demonstrate no abnormal spontaneous activity, normal MUP form and amplitude and normal recruitment pattern.    Conclusions: Normal study. There is no electrodiagnostic evidence of a neuropathy, plexopathy or radiculopathy in the right arm.    Estill Bamberg, MD    - - - - - - - - - - - - -   Resident or Fellow    As the attending physician, my signature affirms that I personally participated in the clinical assessment of this patient, performed or reviewed all electrodiagnostic procedures, and prepared or reviewed the conclusions of this report.      - - - - - - - - - - - -  Attending Electromyographer  Hilbert Bible, MD  Clinical Assistant Professor  Division of Neuromuscular Disorders  Department of Neurology  [place], [place]

## 2022-03-21 NOTE — Unmapped (Signed)
Patient messaged TNC, reporting that he completed labs this morning at [facility] before his EMG and inquiring if he should repeat again tomorrow. Noted that lfts continue to improve with today's results. Discussed with Dr.Chen, who said he can skip tomorrow and repeat labs again on Monday. Notified patient via MyChart.  PA Czepiga reviewed EMG results and said the next step would be CT or MRI if pain continues, since the vascular study was negative as well.     Lab results with persistently elevated glucose levels. Reached out to PharmD Chargualaf who will plan to call patient

## 2022-03-21 NOTE — Unmapped (Deleted)
[facility]  Clinical Neurophysiology Laboratory  [place], [place]         Patient: Matthew Key Date of Birth: 1960/12/27  [facility] #: [id number] Handedness: Right  Sex: Male      Visit Date: 03/21/2022 8:57 AM  Age: 61 Years  Attending: Hilbert Bible, MD   Resident/Fellow: Estill Bamberg, MD   Req Provider: Czepiga  Current Height: 5 feet 8 inch  Chief complaint: Right wrist and hand pain  History/Exam: He has liver disease and had liver transplant 5 weeks ago. When he came out of surgery his right hand was numb for 5-6 days before feeling came back. ABout 2 weeks after his surgery he started to have right wrist and hand pain. He also endorses weakness in his hand as well. T2DM for 5-6 years. No chemotherapy. Symmetric 2+ reflexes in the BUE, 1+ in the BLE. Decreased light touch sensation throughout entire right arm compared to left. Normal vibration. He has pain over dorsum of right hand and decreased sensation there compared to other areas of the arm. Only pain limited weakness in the right hand.       Motor NCS      Nerve / Sites Muscle Latency Ref. Amplitude Ref. Dur. Distance Lat Diff Velocity Ref.     ms ms mV mV ms mm ms m/s m/s   R Median - APB      Wrist APB 3.92 ?4.40 9.7 ?4.2 5.25 8         Elbow APB 9.83  9.3  5.69 29 5.92 49.0 ?49.0   R Ulnar - ADM      Wrist ADM 3.79 ?4.20 9.6 ?5.1 5.25 8         B.Elbow ADM 7.98  9.1  5.56 21.5 4.19 51.3 ?49.0      A.Elbow ADM 10.00  9.1  5.73 10.5 2.02 52.0 ?49.0       F  Wave      Nerve F min Ref.    ms ms   R Median - APB NR ?31.0   R Ulnar - ADM 35.7 ?31.0       Sensory NCS      Nerve / Sites Rec. Site Peak Lat Ref. PP Amp Ref. Distance Vel.     ms ms ??V ??V mm m/s   R Median - Dig II (Antidromic)      Wrist Index 3.77 ?3.80 22.8 ?10.0 14 48   L Median - Dig II (Antidromic)      Wrist Index 3.73 ?3.80 27.1 ?10.0 14 51   R Ulnar - Dig V (Antidromic)      Wrist Dig V 3.69 ?3.80 11.5 ?10.0 14 49   L Ulnar - Dig V (Antidromic)      Wrist Dig V 3.56 ?3.80 16.8 ?10.0 14 49   R Radial - Superficial (Antidromic)      Forearm Wrist 2.75 ?2.80 18.2  10 47   L Radial - Superficial (Antidromic)      Forearm Wrist 2.80 ?2.80 16.9  10 46       EMG Summary Table     Spontaneous MUP Recruitment Comment   Muscle Nerve Roots Fib PSW Fasc Other Amp Dur. PPP Pattern Other   R. First dorsal interosseous Ulnar C8-T1 None None None None N N N Normal None   R. Abductor pollicis brevis Median C8-T1 None None None None N N N Normal None   R. Extensor digitorum communis Radial C7-C8 None None None None  N N N Normal None   R. Biceps brachii Musculocutaneous C5-C6 None None None None N N N Normal None   R. Deltoid Axillary C5-C6 None None None None N N N Normal None       Summary:  After identifying the patient in the waiting room and reviewing all appropriately available medical records, the patient was taken back to the examination room where the procedure was explained, the sites of examination were noted, the patient's questions were answered, and the patient's verbal consent for the procedure was obtained. Studies were performed at [facility] using a radiant warmer and CareFusion Synergy EMG system.    Sensory nerve conduction study of the right and left median, ulnar and radial nerves demonstrate normal peak latency and normal SNAP amplitude.    Motor nerve conduction study of the right median and ulnar nerves demonstrate normal distal motor latency, normal CMAP amplitude and normal conduction velocity. Minimal F-wave is absent for the right median and prolonged for the right ulnar nerves.    Needle EMG of selected muscle demonstrate no abnormal spontaneous activity, normal MUP form and amplitude and normal recruitment pattern.    Conclusions: Normal study. There is no electrodiagnostic evidence of a neuropathy, plexopathy or radiculopathy in the right arm.    Estill Bamberg, MD    - - - - - - - - - - - - -   Resident or Fellow    As the attending physician, my signature affirms that I personally participated in the clinical assessment of this patient, performed or reviewed all electrodiagnostic procedures, and prepared or reviewed the conclusions of this report.      - - - - - - - - - - - -  Attending Electromyographer  Hilbert Bible, MD  Clinical Assistant Professor  Division of Neuromuscular Disorders  Department of Neurology  [place], [place]

## 2022-03-25 DIAGNOSIS — Z796 Long-term use of immunosuppressant medication: Principal | ICD-10-CM

## 2022-03-25 DIAGNOSIS — Z944 Liver transplant status: Principal | ICD-10-CM

## 2022-03-25 NOTE — Unmapped (Addendum)
Patient had repeat labs drawn by home health, but will go to [facility] going forward and went today. K elevated at 5.5 on 8/11, but tac pending still this afternoon per [facility].    Spoke with patient and instructed him to review his list of high K foods and see what dietary modifications he could make in addition to increasing hydration. He reports his gabapentin has not improved his wrist pain. He reports he has numbness to thumb as well. He is aware that no findings were noted on EMG. Reached out to PA Czepiga re.his c/o persistent pain, and she ordered xray studies for his rtc visit. Notified TPA and added to the checklist.

## 2022-03-26 DIAGNOSIS — Z796 Long-term use of immunosuppressant medication: Principal | ICD-10-CM

## 2022-03-26 DIAGNOSIS — Z944 Liver transplant status: Principal | ICD-10-CM

## 2022-03-26 DIAGNOSIS — M79601 Pain in right arm: Principal | ICD-10-CM

## 2022-03-26 DIAGNOSIS — M79641 Pain in right hand: Principal | ICD-10-CM

## 2022-03-26 LAB — CBC W/ DIFFERENTIAL
BANDED NEUTROPHILS ABSOLUTE COUNT: 0.1 10*3/uL (ref 0.0–0.1)
BASOPHILS ABSOLUTE COUNT: 0 10*3/uL (ref 0.0–0.2)
BASOPHILS RELATIVE PERCENT: 1 %
EOSINOPHILS ABSOLUTE COUNT: 0.1 10*3/uL (ref 0.0–0.4)
EOSINOPHILS RELATIVE PERCENT: 1 %
HEMATOCRIT: 28.7 % — ABNORMAL LOW (ref 37.5–51.0)
HEMOGLOBIN: 9.1 g/dL — ABNORMAL LOW (ref 13.0–17.7)
IMMATURE GRANULOCYTES: 2 %
LYMPHOCYTES ABSOLUTE COUNT: 1.1 10*3/uL (ref 0.7–3.1)
LYMPHOCYTES RELATIVE PERCENT: 15 %
MEAN CORPUSCULAR HEMOGLOBIN CONC: 31.7 g/dL (ref 31.5–35.7)
MEAN CORPUSCULAR HEMOGLOBIN: 28.2 pg (ref 26.6–33.0)
MEAN CORPUSCULAR VOLUME: 89 fL (ref 79–97)
MONOCYTES ABSOLUTE COUNT: 0.6 10*3/uL (ref 0.1–0.9)
MONOCYTES RELATIVE PERCENT: 7 %
NEUTROPHILS ABSOLUTE COUNT: 5.6 10*3/uL (ref 1.4–7.0)
NEUTROPHILS RELATIVE PERCENT: 74 %
PLATELET COUNT: 220 10*3/uL (ref 150–450)
RED BLOOD CELL COUNT: 3.23 x10E6/uL — ABNORMAL LOW (ref 4.14–5.80)
RED CELL DISTRIBUTION WIDTH: 13.9 % (ref 11.6–15.4)
WHITE BLOOD CELL COUNT: 7.5 10*3/uL (ref 3.4–10.8)

## 2022-03-26 LAB — COMPREHENSIVE METABOLIC PANEL
A/G RATIO: 1.9 (ref 1.2–2.2)
ALBUMIN: 4.1 g/dL (ref 3.9–4.9)
ALKALINE PHOSPHATASE: 193 IU/L — ABNORMAL HIGH (ref 44–121)
ALT (SGPT): 32 IU/L (ref 0–44)
AST (SGOT): 12 IU/L (ref 0–40)
BILIRUBIN TOTAL (MG/DL) IN SER/PLAS: 0.3 mg/dL (ref 0.0–1.2)
BLOOD UREA NITROGEN: 37 mg/dL — ABNORMAL HIGH (ref 8–27)
BUN / CREAT RATIO: 16 (ref 10–24)
CALCIUM: 9.4 mg/dL (ref 8.6–10.2)
CHLORIDE: 106 mmol/L (ref 96–106)
CO2: 18 mmol/L — ABNORMAL LOW (ref 20–29)
CREATININE: 2.28 mg/dL — ABNORMAL HIGH (ref 0.76–1.27)
EGFR: 32 mL/min/{1.73_m2} — ABNORMAL LOW
GLOBULIN, TOTAL: 2.2 g/dL (ref 1.5–4.5)
GLUCOSE: 108 mg/dL — ABNORMAL HIGH (ref 70–99)
POTASSIUM: 5.6 mmol/L — ABNORMAL HIGH (ref 3.5–5.2)
SODIUM: 139 mmol/L (ref 134–144)
TOTAL PROTEIN: 6.3 g/dL (ref 6.0–8.5)

## 2022-03-26 LAB — TACROLIMUS LEVEL
TACROLIMUS BLOOD: 11.7 ng/mL (ref 2.0–20.0)
TACROLIMUS BLOOD: 31.3 ng/mL (ref 2.0–20.0)

## 2022-03-26 LAB — MAGNESIUM: MAGNESIUM: 1.4 mg/dL — ABNORMAL LOW (ref 1.6–2.3)

## 2022-03-26 LAB — BILIRUBIN, DIRECT: BILIRUBIN DIRECT: 0.17 mg/dL (ref 0.00–0.40)

## 2022-03-26 LAB — PHOSPHORUS: PHOSPHORUS, SERUM: 4.4 mg/dL — ABNORMAL HIGH (ref 2.8–4.1)

## 2022-03-26 LAB — GAMMA GT: GAMMA GLUTAMYL TRANSFERASE: 75 IU/L — ABNORMAL HIGH (ref 0–65)

## 2022-03-27 DIAGNOSIS — M25531 Pain in right wrist: Principal | ICD-10-CM

## 2022-03-27 DIAGNOSIS — M79641 Pain in right hand: Principal | ICD-10-CM

## 2022-03-27 DIAGNOSIS — M25539 Pain in unspecified wrist: Principal | ICD-10-CM

## 2022-03-27 DIAGNOSIS — Z944 Liver transplant status: Principal | ICD-10-CM

## 2022-03-27 DIAGNOSIS — M79631 Pain in right forearm: Principal | ICD-10-CM

## 2022-03-27 MED ORDER — EVEROLIMUS (IMMUNOSUPPRESSIVE) 1 MG TABLET
ORAL_TABLET | Freq: Two times a day (BID) | ORAL | 11 refills | 0 days | Status: CP
Start: 2022-03-27 — End: ?
  Filled 2022-04-01: qty 60, 30d supply, fill #0

## 2022-03-27 NOTE — Unmapped (Signed)
Patient's 8/14 tac level very high and labs timed in afternoon. Contacted patient, who said his labs were drawn around 8am on Monday, and he denied taking his tac before labs. He reports that he completed labs again this morning around the same time and today's tac level should be valid. Reached out to [facility] for 8/11 tac level and left message requesting this result.     Let patient know that an xray was ordered for his right wrist. He reports that was completed on 8/3 at [place], and there has been no change in his pain since then. Requested imaging from [facility] via [facility]hare and notified PA Czepiga who viewed the imaging and recommended Arm CT imaging, since his xrays were inconclusive. She also reviewed labs and felt his 8/14 tac was ambiguous-will wait for today's labs and tac result. Notified TPA for CT scheduling.

## 2022-03-28 DIAGNOSIS — Z944 Liver transplant status: Principal | ICD-10-CM

## 2022-03-28 DIAGNOSIS — E875 Hyperkalemia: Principal | ICD-10-CM

## 2022-03-28 DIAGNOSIS — Z796 Long-term use of immunosuppressant medication: Principal | ICD-10-CM

## 2022-03-28 DIAGNOSIS — I8500 Esophageal varices without bleeding: Principal | ICD-10-CM

## 2022-03-28 DIAGNOSIS — K269 Duodenal ulcer, unspecified as acute or chronic, without hemorrhage or perforation: Principal | ICD-10-CM

## 2022-03-28 LAB — COMPREHENSIVE METABOLIC PANEL
A/G RATIO: 2.1 (ref 1.2–2.2)
ALBUMIN: 4.2 g/dL (ref 3.9–4.9)
ALKALINE PHOSPHATASE: 183 IU/L — ABNORMAL HIGH (ref 44–121)
ALT (SGPT): 28 IU/L (ref 0–44)
AST (SGOT): 12 IU/L (ref 0–40)
BILIRUBIN TOTAL (MG/DL) IN SER/PLAS: 0.3 mg/dL (ref 0.0–1.2)
BLOOD UREA NITROGEN: 38 mg/dL — ABNORMAL HIGH (ref 8–27)
BUN / CREAT RATIO: 16 (ref 10–24)
CALCIUM: 9.4 mg/dL (ref 8.6–10.2)
CHLORIDE: 107 mmol/L — ABNORMAL HIGH (ref 96–106)
CO2: 17 mmol/L — ABNORMAL LOW (ref 20–29)
CREATININE: 2.4 mg/dL — ABNORMAL HIGH (ref 0.76–1.27)
EGFR: 30 mL/min/{1.73_m2} — ABNORMAL LOW
GLOBULIN, TOTAL: 2 g/dL (ref 1.5–4.5)
GLUCOSE: 156 mg/dL — ABNORMAL HIGH (ref 70–99)
POTASSIUM: 5.8 mmol/L — ABNORMAL HIGH (ref 3.5–5.2)
SODIUM: 139 mmol/L (ref 134–144)
TOTAL PROTEIN: 6.2 g/dL (ref 6.0–8.5)

## 2022-03-28 LAB — GAMMA GT: GAMMA GLUTAMYL TRANSFERASE: 64 IU/L (ref 0–65)

## 2022-03-28 LAB — CBC W/ DIFFERENTIAL
BANDED NEUTROPHILS ABSOLUTE COUNT: 0.1 10*3/uL (ref 0.0–0.1)
BASOPHILS ABSOLUTE COUNT: 0 10*3/uL (ref 0.0–0.2)
BASOPHILS RELATIVE PERCENT: 1 %
EOSINOPHILS ABSOLUTE COUNT: 0.1 10*3/uL (ref 0.0–0.4)
EOSINOPHILS RELATIVE PERCENT: 1 %
HEMATOCRIT: 27.9 % — ABNORMAL LOW (ref 37.5–51.0)
HEMOGLOBIN: 9 g/dL — ABNORMAL LOW (ref 13.0–17.7)
IMMATURE GRANULOCYTES: 1 %
LYMPHOCYTES ABSOLUTE COUNT: 1.1 10*3/uL (ref 0.7–3.1)
LYMPHOCYTES RELATIVE PERCENT: 18 %
MEAN CORPUSCULAR HEMOGLOBIN CONC: 32.3 g/dL (ref 31.5–35.7)
MEAN CORPUSCULAR HEMOGLOBIN: 29.3 pg (ref 26.6–33.0)
MEAN CORPUSCULAR VOLUME: 91 fL (ref 79–97)
MONOCYTES ABSOLUTE COUNT: 0.4 10*3/uL (ref 0.1–0.9)
MONOCYTES RELATIVE PERCENT: 6 %
NEUTROPHILS ABSOLUTE COUNT: 4.6 10*3/uL (ref 1.4–7.0)
NEUTROPHILS RELATIVE PERCENT: 73 %
PLATELET COUNT: 208 10*3/uL (ref 150–450)
RED BLOOD CELL COUNT: 3.07 x10E6/uL — ABNORMAL LOW (ref 4.14–5.80)
RED CELL DISTRIBUTION WIDTH: 14 % (ref 11.6–15.4)
WHITE BLOOD CELL COUNT: 6.2 10*3/uL (ref 3.4–10.8)

## 2022-03-28 LAB — BILIRUBIN, DIRECT: BILIRUBIN DIRECT: 0.14 mg/dL (ref 0.00–0.40)

## 2022-03-28 LAB — MAGNESIUM: MAGNESIUM: 1.4 mg/dL — ABNORMAL LOW (ref 1.6–2.3)

## 2022-03-28 LAB — TACROLIMUS LEVEL: TACROLIMUS BLOOD: 6.7

## 2022-03-28 LAB — PHOSPHORUS: PHOSPHORUS, SERUM: 4.3 mg/dL — ABNORMAL HIGH (ref 2.8–4.1)

## 2022-03-28 MED ORDER — LANCETS
0 refills | 0 days | Status: CN
Start: 2022-03-28 — End: ?

## 2022-03-28 MED ORDER — METHOCARBAMOL 500 MG TABLET
ORAL_TABLET | Freq: Three times a day (TID) | ORAL | 0 refills | 30 days | PRN
Start: 2022-03-28 — End: ?

## 2022-03-28 MED ORDER — BLOOD-GLUCOSE METER
0 refills | 0 days | Status: CN
Start: 2022-03-28 — End: 2023-03-28

## 2022-03-28 MED ORDER — INSULIN DEGLUDEC (U-100) 100 UNIT/ML (3 ML) SUBCUTANEOUS PEN
Freq: Every day | SUBCUTANEOUS | 3 refills | 88 days | Status: CP
Start: 2022-03-28 — End: 2023-03-28
  Filled 2022-04-01: qty 15, 41d supply, fill #0

## 2022-03-28 MED ORDER — SODIUM ZIRCONIUM CYCLOSILICATE 10 GRAM ORAL POWDER PACKET
PACK | Freq: Three times a day (TID) | ORAL | 0 refills | 2 days | Status: CP
Start: 2022-03-28 — End: 2022-03-30

## 2022-03-28 MED ORDER — SODIUM POLYSTYRENE SULFONATE 15 GRAM/60 ML ORAL SUSPENSION
Freq: Every day | ORAL | 0 refills | 2.00000 days | Status: CP
Start: 2022-03-28 — End: 2022-03-28

## 2022-03-28 MED ORDER — PANTOPRAZOLE 40 MG TABLET,DELAYED RELEASE
ORAL_TABLET | Freq: Every day | ORAL | 11 refills | 30 days | Status: CP
Start: 2022-03-28 — End: ?

## 2022-03-28 NOTE — Unmapped (Signed)
[facility] Specialty Medication Onboarding    Specialty Medication: Everolimus 1mg  tablet  Prior Authorization: Not Required   Financial Assistance: No - copay  <$25  Final Copay/Day Supply: $0 / 30 days    Insurance Restrictions: None     Notes to Pharmacist: N/A    The triage team has completed the benefits investigation and has determined that the patient is able to fill this medication at [facility]. Please contact the patient to complete the onboarding or follow up with the prescribing physician as needed.

## 2022-03-28 NOTE — Unmapped (Signed)
Lvm for pt re arm ct availability on 8/28 when he has appts already scheduled.  Asked for call back and left call-back number.    Pt called and is scheduled for arm ct at 2 pm in [place] on 8/28. He is familiar with [facility] and verbalized understanding of all discussed.

## 2022-03-28 NOTE — Unmapped (Signed)
Patient's labs yesterday with K-5.8. Discussed with PharmD Chargualaf, who recommended patient take lokelma  10g TID x2 days to bring it down quickly then have him take 10g once daily. If Lokelma is not available, she recommended Veltassa 8.4g daily, or if no Veltassa then Kayexalate 15 g daily x2 days.   Contacted patient's local [facility] and was referred to another [employer] store closer for [employer]. Spoke with patient, who denied any sx of palpitations, chest pain, dizziness or sob. Relayed dosage instructions, pharmacy location, and importance of increasing hydration. He verbalized understanding and agreed to go pickup his 2-day rx after the phone call.     His 8/11 tac just resulted this morning subtherapeutic, further supporting 8/14 level as an outlier, but 8/16 tac still pending.

## 2022-03-28 NOTE — Unmapped (Addendum)
Patient returned call to [facility], reporting that he was unable to pick up his lokelma, since a prior authorization was needed. Contacted the pharmacy and provided the correct fax number to forward to txp and notified TPAs to watch for it. By end of business day, no PA was sent. Contacted [facility] to request PA again. They did not have inventory of Veltassa or kayexalate. Contacted several pharmacies in the area and [facility]. Unable to confirm inventory via phone after holding for 40 min. Rx sent and patient instructed to go there for kayexalate instead. He is aware Lokelma PA might not be approved before the weekend.

## 2022-03-28 NOTE — Unmapped (Signed)
The patient is requesting a medication refill

## 2022-03-28 NOTE — Unmapped (Signed)
TRANSPLANT PHARMACIST PHONE CALL  From 03/22/22    Reason for call: BG follow up given elevated BG on labs    Subjective: Wife check BG 3-4 times per day and typically 120-150 fasting and <200 prior to meals. Denies hypoglycemia    Current meds: Tresiba 15 units daily + Novolog per sliding scale    Plan:  Increase Tresiba to 17 units daily     Follow up:   Consider adding back Ozempic at 8/28 visit    Hazeline Junker, PharmD, CPP  Abdominal Transplant Clinical Pharmacist Practitioner

## 2022-03-29 DIAGNOSIS — Z944 Liver transplant status: Principal | ICD-10-CM

## 2022-03-29 LAB — TACROLIMUS LEVEL: TACROLIMUS BLOOD: 9.5 ng/mL (ref 2.0–20.0)

## 2022-03-29 MED ORDER — PATIROMER CALCIUM SORBITEX 8.4 GRAM ORAL POWDER PACKET
PACK | Freq: Every day | ORAL | 1 refills | 30 days | Status: CP
Start: 2022-03-29 — End: ?
  Filled 2022-04-01: qty 30, 30d supply, fill #0

## 2022-03-29 MED FILL — ASPIRIN 81 MG TABLET,DELAYED RELEASE: ORAL | 90 days supply | Qty: 90 | Fill #1

## 2022-03-29 MED FILL — ULTICARE PEN NEEDLE 32 GAUGE X 5/32" (4 MM): 50 days supply | Qty: 100 | Fill #0

## 2022-03-29 MED FILL — ACCU-CHEK GUIDE TEST STRIPS: 25 days supply | Qty: 100 | Fill #0

## 2022-03-29 MED FILL — MELATONIN 3 MG TABLET: ORAL | 60 days supply | Qty: 60 | Fill #0

## 2022-03-29 MED FILL — ACETAMINOPHEN 325 MG TABLET: ORAL | 13 days supply | Qty: 100 | Fill #1

## 2022-03-29 NOTE — Unmapped (Signed)
Sodium polystyrene order entry requested by primary TNC.

## 2022-03-29 NOTE — Unmapped (Signed)
[facility] Refill Coordination Note    Specialty Medication(s) to be Shipped:   Transplant: mycophenolate mofetil 250mg , tacrolimus 1mg , valgancyclovir 450mg , and Zortress 1mg     Other medication(s) to be shipped:  Novolog and Matthew Key, DOB: 08-Nov-1960  Phone: [phone] (home)       All above HIPAA information was verified with patient.     Was a Nurse, learning disability used for this call? No    Completed refill call assessment today to schedule patient's medication shipment from the [facility] [phone]).  All relevant notes have been reviewed.     Specialty medication(s) and dose(s) confirmed: Regimen is correct and unchanged.   Changes to medications: Gorden reports no changes at this time.  Changes to insurance: No  New side effects reported not previously addressed with a pharmacist or physician: None reported  Questions for the pharmacist: No    Confirmed patient received a Conservation officer, historic buildings and a Surveyor, mining with first shipment. The patient will receive a drug information handout for each medication shipped and additional FDA Medication Guides as required.       DISEASE/MEDICATION-SPECIFIC INFORMATION        N/A    SPECIALTY MEDICATION ADHERENCE     Medication Adherence    Patient reported X missed doses in the last month: 0  Specialty Medication: mycophenolate 250 mg  Patient is on additional specialty medications: Yes  Additional Specialty Medications: Tacrolimus 1 mg   Patient Reported Additional Medication X Missed Doses in the Last Month: 0  Patient is on more than two specialty medications: Yes  Specialty Medication: valganciclovir 450 mg  Patient Reported Additional Medication X Missed Doses in the Last Month: 0  Specialty Medication: Zortress 1 mg  Patient Reported Additional Medication X Missed Doses in the Last Month: 0                                Were doses missed due to medication being on hold? No    Zortress 1 mg: 7 days of medicine on hand   valganciclovir 450 mg: 7 days of medicine on hand   tacrolimus 1 mg: 7 days of medicine on hand   mycophenolate 250 mg: 7 days of medicine on hand       REFERRAL TO PHARMACIST     Referral to the pharmacist: Not needed      [facility]     Shipping address confirmed in Epic.     Delivery Scheduled: Yes, Expected medication delivery date: 04/03/22.     Medication will be delivered via UPS to the prescription address in Epic WAM.    [name]   [facility] Specialty Technician

## 2022-03-29 NOTE — Unmapped (Addendum)
Lokelma PA denied as Matthew Key is preferred by insurance.  Sent Veltassa Rx to [facility] [facility].  Let pt know that, if affordable, he will take this medication once daily in the afternoon away from other meds.

## 2022-03-30 MED ORDER — SODIUM ZIRCONIUM CYCLOSILICATE 10 GRAM ORAL POWDER PACKET
PACK | Freq: Every day | ORAL | 11 refills | 30 days | Status: CP
Start: 2022-03-30 — End: 2023-03-25

## 2022-04-01 DIAGNOSIS — Z796 Long-term use of immunosuppressant medication: Principal | ICD-10-CM

## 2022-04-01 DIAGNOSIS — Z944 Liver transplant status: Principal | ICD-10-CM

## 2022-04-01 MED FILL — TRESIBA FLEXTOUCH U-100 INSULIN 100 UNIT/ML (3 ML) SUBCUTANEOUS PEN: SUBCUTANEOUS | 88 days supply | Qty: 15 | Fill #0

## 2022-04-01 MED FILL — BAQSIMI 3 MG/ACTUATION NASAL SPRAY: NASAL | 1 days supply | Qty: 2 | Fill #0

## 2022-04-01 NOTE — Unmapped (Signed)
Pt called back to reschedule delivery for 8/22 and to add veltessa medication

## 2022-04-01 NOTE — Unmapped (Signed)
Azam Marlinda [facility] Blood 's Mycophenolate and Tacrolimus shipment will be delayed as a result of the medication is too soon to refill until 04/06/2022.     I have reached out to the patient  at [phone] and communicated the delivery change. We will reschedule the medication for the delivery date that the patient agreed upon.  We have confirmed the delivery date as 04/09/2022, via ups.     Veltassa was shipped in a separate package by itself. All other meds in the order are being shipped as originally scheduled.

## 2022-04-01 NOTE — Unmapped (Signed)
Patient was unable to obtain PA for lokelma, but veltassa preferred by insurance with no copay. Spoke with patient, who confirmed he took the kayexalate on Fri/Sat, with only 1 episode of diarrhea, and repeated labs this morning. Instructed him to contact [facility] for expedited shipping of veltassa with no copay. He verbalized understanding.

## 2022-04-02 DIAGNOSIS — Z796 Long-term use of immunosuppressant medication: Principal | ICD-10-CM

## 2022-04-02 DIAGNOSIS — Z944 Liver transplant status: Principal | ICD-10-CM

## 2022-04-02 LAB — CBC W/ DIFFERENTIAL
BANDED NEUTROPHILS ABSOLUTE COUNT: 0.3 10*3/uL — ABNORMAL HIGH (ref 0.0–0.1)
BASOPHILS ABSOLUTE COUNT: 0 10*3/uL (ref 0.0–0.2)
BASOPHILS RELATIVE PERCENT: 1 %
EOSINOPHILS ABSOLUTE COUNT: 0.1 10*3/uL (ref 0.0–0.4)
EOSINOPHILS RELATIVE PERCENT: 1 %
HEMATOCRIT: 28.8 % — ABNORMAL LOW (ref 37.5–51.0)
HEMOGLOBIN: 9.4 g/dL — ABNORMAL LOW (ref 13.0–17.7)
IMMATURE GRANULOCYTES: 5 %
LYMPHOCYTES ABSOLUTE COUNT: 1.3 10*3/uL (ref 0.7–3.1)
LYMPHOCYTES RELATIVE PERCENT: 22 %
MEAN CORPUSCULAR HEMOGLOBIN CONC: 32.6 g/dL (ref 31.5–35.7)
MEAN CORPUSCULAR HEMOGLOBIN: 29.2 pg (ref 26.6–33.0)
MEAN CORPUSCULAR VOLUME: 89 fL (ref 79–97)
MONOCYTES ABSOLUTE COUNT: 0.3 10*3/uL (ref 0.1–0.9)
MONOCYTES RELATIVE PERCENT: 5 %
NEUTROPHILS ABSOLUTE COUNT: 3.8 10*3/uL (ref 1.4–7.0)
NEUTROPHILS RELATIVE PERCENT: 66 %
PLATELET COUNT: 209 10*3/uL (ref 150–450)
RED BLOOD CELL COUNT: 3.22 x10E6/uL — ABNORMAL LOW (ref 4.14–5.80)
RED CELL DISTRIBUTION WIDTH: 13.9 % (ref 11.6–15.4)
WHITE BLOOD CELL COUNT: 5.8 10*3/uL (ref 3.4–10.8)

## 2022-04-02 LAB — COMPREHENSIVE METABOLIC PANEL
A/G RATIO: 1.7 (ref 1.2–2.2)
ALBUMIN: 4 g/dL (ref 3.9–4.9)
ALKALINE PHOSPHATASE: 176 IU/L — ABNORMAL HIGH (ref 44–121)
ALT (SGPT): 18 IU/L (ref 0–44)
AST (SGOT): 13 IU/L (ref 0–40)
BILIRUBIN TOTAL (MG/DL) IN SER/PLAS: 0.2 mg/dL (ref 0.0–1.2)
BLOOD UREA NITROGEN: 34 mg/dL — ABNORMAL HIGH (ref 8–27)
BUN / CREAT RATIO: 18 (ref 10–24)
CALCIUM: 9.1 mg/dL (ref 8.6–10.2)
CHLORIDE: 109 mmol/L — ABNORMAL HIGH (ref 96–106)
CO2: 20 mmol/L (ref 20–29)
CREATININE: 1.9 mg/dL — ABNORMAL HIGH (ref 0.76–1.27)
EGFR: 39 mL/min/{1.73_m2} — ABNORMAL LOW
GLOBULIN, TOTAL: 2.4 g/dL (ref 1.5–4.5)
GLUCOSE: 109 mg/dL — ABNORMAL HIGH (ref 70–99)
POTASSIUM: 5.4 mmol/L — ABNORMAL HIGH (ref 3.5–5.2)
SODIUM: 142 mmol/L (ref 134–144)
TOTAL PROTEIN: 6.4 g/dL (ref 6.0–8.5)

## 2022-04-02 LAB — PHOSPHORUS: PHOSPHORUS, SERUM: 5 mg/dL — ABNORMAL HIGH (ref 2.8–4.1)

## 2022-04-02 LAB — BILIRUBIN, DIRECT: BILIRUBIN DIRECT: 0.12 mg/dL (ref 0.00–0.40)

## 2022-04-02 LAB — MAGNESIUM: MAGNESIUM: 1.5 mg/dL — ABNORMAL LOW (ref 1.6–2.3)

## 2022-04-02 LAB — GAMMA GT: GAMMA GLUTAMYL TRANSFERASE: 48 IU/L (ref 0–65)

## 2022-04-02 NOTE — Unmapped (Signed)
Patient's labs yesterday with improvement in K, but still at 5.4. Contacted him to confirm receipt of his veltassa. He said the med was delivered today, and he is now preparing to take it. He will repeat labs on Thurs. Tac from yesterday still pending.

## 2022-04-03 DIAGNOSIS — Z944 Liver transplant status: Principal | ICD-10-CM

## 2022-04-03 DIAGNOSIS — M791 Myalgia, unspecified site: Principal | ICD-10-CM

## 2022-04-03 LAB — TACROLIMUS LEVEL: TACROLIMUS BLOOD: 9.7 ng/mL (ref 2.0–20.0)

## 2022-04-03 MED ORDER — METHOCARBAMOL 500 MG TABLET
ORAL_TABLET | Freq: Three times a day (TID) | ORAL | 0 refills | 30 days | Status: CP | PRN
Start: 2022-04-03 — End: ?
  Filled 2022-04-09: qty 90, 30d supply, fill #0

## 2022-04-04 DIAGNOSIS — Z796 Long-term use of immunosuppressant medication: Principal | ICD-10-CM

## 2022-04-04 DIAGNOSIS — Z944 Liver transplant status: Principal | ICD-10-CM

## 2022-04-05 LAB — COMPREHENSIVE METABOLIC PANEL
A/G RATIO: 2.3 — ABNORMAL HIGH (ref 1.2–2.2)
ALBUMIN: 4.3 g/dL (ref 3.9–4.9)
ALKALINE PHOSPHATASE: 160 IU/L — ABNORMAL HIGH (ref 44–121)
ALT (SGPT): 29 IU/L (ref 0–44)
AST (SGOT): 17 IU/L (ref 0–40)
BILIRUBIN TOTAL (MG/DL) IN SER/PLAS: 0.3 mg/dL (ref 0.0–1.2)
BLOOD UREA NITROGEN: 40 mg/dL — ABNORMAL HIGH (ref 8–27)
BUN / CREAT RATIO: 22 (ref 10–24)
CALCIUM: 9.6 mg/dL (ref 8.6–10.2)
CHLORIDE: 109 mmol/L — ABNORMAL HIGH (ref 96–106)
CO2: 19 mmol/L — ABNORMAL LOW (ref 20–29)
CREATININE: 1.8 mg/dL — ABNORMAL HIGH (ref 0.76–1.27)
EGFR: 42 mL/min/{1.73_m2} — ABNORMAL LOW
GLOBULIN, TOTAL: 1.9 g/dL (ref 1.5–4.5)
GLUCOSE: 89 mg/dL (ref 70–99)
POTASSIUM: 5.7 mmol/L — ABNORMAL HIGH (ref 3.5–5.2)
SODIUM: 142 mmol/L (ref 134–144)
TOTAL PROTEIN: 6.2 g/dL (ref 6.0–8.5)

## 2022-04-05 LAB — CBC W/ DIFFERENTIAL
BASOPHILS ABSOLUTE COUNT: 0.1 10*3/uL (ref 0.0–0.2)
BASOPHILS RELATIVE PERCENT: 2 %
EOSINOPHILS ABSOLUTE COUNT: 0 10*3/uL (ref 0.0–0.4)
EOSINOPHILS RELATIVE PERCENT: 0 %
HEMATOCRIT: 30.1 % — ABNORMAL LOW (ref 37.5–51.0)
HEMOGLOBIN: 9.5 g/dL — ABNORMAL LOW (ref 13.0–17.7)
LYMPHOCYTES ABSOLUTE COUNT: 1.2 10*3/uL (ref 0.7–3.1)
LYMPHOCYTES RELATIVE PERCENT: 25 %
MEAN CORPUSCULAR HEMOGLOBIN CONC: 31.6 g/dL (ref 31.5–35.7)
MEAN CORPUSCULAR HEMOGLOBIN: 28.7 pg (ref 26.6–33.0)
MEAN CORPUSCULAR VOLUME: 91 fL (ref 79–97)
MONOCYTES ABSOLUTE COUNT: 0.2 10*3/uL (ref 0.1–0.9)
MONOCYTES RELATIVE PERCENT: 4 %
NEUTROPHILS ABSOLUTE COUNT: 3.1 10*3/uL (ref 1.4–7.0)
NEUTROPHILS RELATIVE PERCENT: 63 %
PLATELET COUNT: 177 10*3/uL (ref 150–450)
RED BLOOD CELL COUNT: 3.31 x10E6/uL — ABNORMAL LOW (ref 4.14–5.80)
RED CELL DISTRIBUTION WIDTH: 13.9 % (ref 11.6–15.4)
WHITE BLOOD CELL COUNT: 4.9 10*3/uL (ref 3.4–10.8)

## 2022-04-05 LAB — IMMATURE CELLS: METAMYLOCYTES-LABCORP: 6 % — ABNORMAL HIGH (ref 0–0)

## 2022-04-05 LAB — BILIRUBIN, DIRECT: BILIRUBIN DIRECT: <0.1 mg/dL (ref 0.00–0.40)

## 2022-04-05 LAB — PHOSPHORUS: PHOSPHORUS, SERUM: 4.6 mg/dL — ABNORMAL HIGH (ref 2.8–4.1)

## 2022-04-05 LAB — MAGNESIUM: MAGNESIUM: 1.4 mg/dL — ABNORMAL LOW (ref 1.6–2.3)

## 2022-04-05 LAB — GAMMA GT: GAMMA GLUTAMYL TRANSFERASE: 41 IU/L (ref 0–65)

## 2022-04-05 MED ORDER — MG-PLUS-PROTEIN 133 MG TABLET
ORAL_TABLET | Freq: Two times a day (BID) | ORAL | 3 refills | 90 days | Status: CP
Start: 2022-04-05 — End: ?
  Filled 2022-05-02: qty 360, 90d supply, fill #0

## 2022-04-05 NOTE — Unmapped (Signed)
Patient's repeat labs yesterday continue with elevated K at 5.7, Mg-1.4. Patient also sent notification via MyChart, that he received everolimus, which was only run for a test claim. Discussed labs/IS with PharmD Chargualaf, who recommended patient continue to take the 8.4g daily Veltassa dose, increase his Mg to 2 tabs twice daily and bring his Everolimus to clinic on Monday.     Spoke to patient, who confirmed he had not taken any Everolimus. Explained that his IS would be reviewed at his clinic appt on Monday. Relayed other recommendations as well. He verbalized understanding.

## 2022-04-07 LAB — TACROLIMUS LEVEL: TACROLIMUS BLOOD: 13.1 ng/mL (ref 2.0–20.0)

## 2022-04-08 ENCOUNTER — Ambulatory Visit: Admit: 2022-04-08 | Discharge: 2022-04-08 | Disposition: A | Discharge status: Home / Self Care | Payer: MEDICARE

## 2022-04-08 ENCOUNTER — Ambulatory Visit: Admit: 2022-04-08 | Discharge: 2022-04-09 | Disposition: A | Discharge status: Home / Self Care | Payer: MEDICARE

## 2022-04-08 ENCOUNTER — Institutional Professional Consult (permissible substitution): Admit: 2022-04-08 | Discharge: 2022-04-09 | Disposition: A | Discharge status: Home / Self Care | Payer: MEDICARE

## 2022-04-08 ENCOUNTER — Emergency Department: Admit: 2022-04-08 | Discharge: 2022-04-09 | Disposition: A | Discharge status: Home / Self Care | Payer: MEDICARE

## 2022-04-08 DIAGNOSIS — Z944 Liver transplant status: Principal | ICD-10-CM

## 2022-04-08 DIAGNOSIS — E875 Hyperkalemia: Principal | ICD-10-CM

## 2022-04-08 DIAGNOSIS — Z796 Long-term use of immunosuppressant medication: Principal | ICD-10-CM

## 2022-04-08 LAB — COMPREHENSIVE METABOLIC PANEL
ALBUMIN: 4.3 g/dL (ref 3.4–5.0)
ALBUMIN: 4.1 g/dL (ref 3.4–5.0)
ALKALINE PHOSPHATASE: 159 U/L — ABNORMAL HIGH (ref 46–116)
ALKALINE PHOSPHATASE: 152 U/L — ABNORMAL HIGH (ref 46–116)
ALT (SGPT): 21 U/L (ref 10–49)
ALT (SGPT): 22 U/L (ref 10–49)
ANION GAP: 7 mmol/L (ref 5–14)
ANION GAP: 7 mmol/L (ref 5–14)
AST (SGOT): 14 U/L (ref <=34)
AST (SGOT): 12 U/L (ref <=34)
BILIRUBIN TOTAL: 0.3 mg/dL (ref 0.3–1.2)
BILIRUBIN TOTAL: 0.3 mg/dL (ref 0.3–1.2)
BLOOD UREA NITROGEN: 34 mg/dL — ABNORMAL HIGH (ref 9–23)
BLOOD UREA NITROGEN: 34 mg/dL — ABNORMAL HIGH (ref 9–23)
BUN / CREAT RATIO: 18
BUN / CREAT RATIO: 18
CALCIUM: 9.4 mg/dL (ref 8.7–10.4)
CALCIUM: 9.9 mg/dL (ref 8.7–10.4)
CHLORIDE: 112 mmol/L — ABNORMAL HIGH (ref 98–107)
CHLORIDE: 113 mmol/L — ABNORMAL HIGH (ref 98–107)
CO2: 21 mmol/L (ref 20.0–31.0)
CO2: 20.3 mmol/L (ref 20.0–31.0)
CREATININE: 1.86 mg/dL — ABNORMAL HIGH
CREATININE: 1.86 mg/dL — ABNORMAL HIGH
EGFR CKD-EPI (2021) MALE: 40 mL/min/{1.73_m2} — ABNORMAL LOW (ref >=60)
EGFR CKD-EPI (2021) MALE: 40 mL/min/{1.73_m2} — ABNORMAL LOW (ref >=60)
GLUCOSE RANDOM: 144 mg/dL (ref 70–179)
GLUCOSE RANDOM: 171 mg/dL (ref 70–179)
POTASSIUM: 6.7 mmol/L (ref 3.4–4.8)
POTASSIUM: 5.9 mmol/L — ABNORMAL HIGH (ref 3.4–4.8)
PROTEIN TOTAL: 6.9 g/dL (ref 5.7–8.2)
PROTEIN TOTAL: 6.7 g/dL (ref 5.7–8.2)
SODIUM: 140 mmol/L (ref 135–145)
SODIUM: 140 mmol/L (ref 135–145)

## 2022-04-08 LAB — BASIC METABOLIC PANEL
ANION GAP: 6 mmol/L (ref 5–14)
BLOOD UREA NITROGEN: 34 mg/dL — ABNORMAL HIGH (ref 9–23)
BUN / CREAT RATIO: 19
CALCIUM: 10 mg/dL (ref 8.7–10.4)
CHLORIDE: 115 mmol/L — ABNORMAL HIGH (ref 98–107)
CO2: 20.2 mmol/L (ref 20.0–31.0)
CREATININE: 1.77 mg/dL — ABNORMAL HIGH
EGFR CKD-EPI (2021) MALE: 43 mL/min/{1.73_m2} — ABNORMAL LOW (ref >=60)
GLUCOSE RANDOM: 169 mg/dL (ref 70–179)
POTASSIUM: 5.4 mmol/L — ABNORMAL HIGH (ref 3.4–4.8)
SODIUM: 141 mmol/L (ref 135–145)

## 2022-04-08 LAB — LIPID PANEL
CHOLESTEROL/HDL RATIO SCREEN: 2.8 (ref 1.0–4.5)
CHOLESTEROL: 89 mg/dL (ref <=200)
HDL CHOLESTEROL: 32 mg/dL — ABNORMAL LOW (ref 40–60)
LDL CHOLESTEROL CALCULATED: 36 mg/dL — ABNORMAL LOW (ref 40–99)
NON-HDL CHOLESTEROL: 57 mg/dL — ABNORMAL LOW (ref 70–130)
TRIGLYCERIDES: 107 mg/dL (ref 0–150)
VLDL CHOLESTEROL CAL: 21.4 mg/dL (ref 12–42)

## 2022-04-08 LAB — TACROLIMUS LEVEL: TACROLIMUS BLOOD: 6.4 ng/mL

## 2022-04-08 LAB — CBC W/ AUTO DIFF
BASOPHILS ABSOLUTE COUNT: 0 10*9/L (ref 0.0–0.1)
BASOPHILS RELATIVE PERCENT: 0.4 %
EOSINOPHILS ABSOLUTE COUNT: 0.1 10*9/L (ref 0.0–0.5)
EOSINOPHILS RELATIVE PERCENT: 1.1 %
HEMATOCRIT: 33 % — ABNORMAL LOW (ref 39.0–48.0)
HEMOGLOBIN: 10.9 g/dL — ABNORMAL LOW (ref 12.9–16.5)
LYMPHOCYTES ABSOLUTE COUNT: 0.9 10*9/L — ABNORMAL LOW (ref 1.1–3.6)
LYMPHOCYTES RELATIVE PERCENT: 16.4 %
MEAN CORPUSCULAR HEMOGLOBIN CONC: 33 g/dL (ref 32.0–36.0)
MEAN CORPUSCULAR HEMOGLOBIN: 29.2 pg (ref 25.9–32.4)
MEAN CORPUSCULAR VOLUME: 88.6 fL (ref 77.6–95.7)
MEAN PLATELET VOLUME: 10 fL (ref 6.8–10.7)
MONOCYTES ABSOLUTE COUNT: 0.2 10*9/L — ABNORMAL LOW (ref 0.3–0.8)
MONOCYTES RELATIVE PERCENT: 3.7 %
NEUTROPHILS ABSOLUTE COUNT: 4.5 10*9/L (ref 1.8–7.8)
NEUTROPHILS RELATIVE PERCENT: 78.4 %
PLATELET COUNT: 154 10*9/L (ref 150–450)
RED BLOOD CELL COUNT: 3.73 10*12/L — ABNORMAL LOW (ref 4.26–5.60)
RED CELL DISTRIBUTION WIDTH: 14.9 % (ref 12.2–15.2)
WBC ADJUSTED: 5.7 10*9/L (ref 3.6–11.2)

## 2022-04-08 LAB — SLIDE REVIEW

## 2022-04-08 LAB — BILIRUBIN, DIRECT: BILIRUBIN DIRECT: 0.2 mg/dL (ref 0.00–0.30)

## 2022-04-08 LAB — MAGNESIUM
MAGNESIUM: 1.3 mg/dL — ABNORMAL LOW (ref 1.6–2.6)
MAGNESIUM: 1.4 mg/dL — ABNORMAL LOW (ref 1.6–2.6)

## 2022-04-08 LAB — PHOSPHORUS: PHOSPHORUS: 4.5 mg/dL (ref 2.4–5.1)

## 2022-04-08 LAB — PROTEIN / CREATININE RATIO, URINE
CREATININE, URINE: 74.5 mg/dL
PROTEIN URINE: 11.6 mg/dL
PROTEIN/CREAT RATIO, URINE: 0.156

## 2022-04-08 LAB — GAMMA GT: GAMMA GLUTAMYL TRANSFERASE: 44 U/L

## 2022-04-08 MED ORDER — DULOXETINE 30 MG CAPSULE,DELAYED RELEASE
ORAL_CAPSULE | Freq: Every day | ORAL | 3 refills | 90 days | Status: CP
Start: 2022-04-08 — End: 2023-04-08
  Filled 2022-04-08: qty 90, 90d supply, fill #0

## 2022-04-08 MED ORDER — PATIROMER CALCIUM SORBITEX 16.8 GRAM ORAL POWDER PACKET
PACK | Freq: Every day | ORAL | 2 refills | 30 days | Status: CP
Start: 2022-04-08 — End: ?
  Filled 2022-04-11: qty 30, 30d supply, fill #0

## 2022-04-08 MED ORDER — AMLODIPINE 5 MG TABLET
ORAL_TABLET | Freq: Every day | ORAL | 11 refills | 30.00000 days | Status: CP
Start: 2022-04-08 — End: 2022-04-08
  Filled 2022-04-08: qty 30, 30d supply, fill #0

## 2022-04-08 MED ORDER — PANTOPRAZOLE 40 MG TABLET,DELAYED RELEASE
ORAL_TABLET | Freq: Every day | ORAL | 11 refills | 30 days | Status: CP | PRN
Start: 2022-04-08 — End: ?
  Filled 2022-05-29: qty 30, 30d supply, fill #0

## 2022-04-08 MED ORDER — INSULIN DEGLUDEC (U-100) 100 UNIT/ML (3 ML) SUBCUTANEOUS PEN
Freq: Every day | SUBCUTANEOUS | 3 refills | 100 days | Status: CP
Start: 2022-04-08 — End: 2023-04-08
  Filled 2022-06-20: qty 15, 115d supply, fill #0

## 2022-04-08 MED ORDER — TACROLIMUS 1 MG CAPSULE, IMMEDIATE-RELEASE
ORAL_CAPSULE | Freq: Two times a day (BID) | ORAL | 11 refills | 34 days | Status: CP
Start: 2022-04-08 — End: ?
  Filled 2022-04-09: qty 480, 34d supply, fill #0

## 2022-04-08 MED ADMIN — insulin regular (HumuLIN,NovoLIN) injection 10 Units: 10 [IU] | INTRAVENOUS | @ 21:00:00 | Stop: 2022-04-08

## 2022-04-08 MED ADMIN — calcium gluconate in sodium chloride (NS) 0.9% 2 gram/100 mL IVPB 2 g: 2 g | INTRAVENOUS | @ 21:00:00 | Stop: 2022-04-08

## 2022-04-08 MED ADMIN — dextrose 50 % in water (D50W) 50 % solution 25 g: 25 g | INTRAVENOUS | @ 21:00:00 | Stop: 2022-04-08

## 2022-04-08 NOTE — Unmapped (Signed)
PT reports here from Main ED, sent by his liver coordinator to evaluate his potassium level.

## 2022-04-08 NOTE — Unmapped (Signed)
TRANSPLANT SURGERY PROGRESS NOTE    Assessment and Plan  - Over all doing well graft function stable   - Continues to have right wrist pain - start Cymbalta   - Plans for RUE CT  - Switch Tacrolimus to Envarsus, stop MMF  - Start Amlodipine   - Insulin adjust per pharmacy, needs Endo follow up   - Increase physical activity , start walking program , maintain weight restriction   - RTC at 3 months post op 9/25 with Dr. Celine Mans       Subjective  Matthew Key is a 61 y.o. male who is 2+ month post OLT. His post op course has been complicated by on going right arm pain and transient episode of elevated LFT's. He was sent to the ED on 8/9  for imaging and evaluation . No cornering findings, etiology unknown. LFT's started to trend back down.   Diagnostic work up for his arm pain includes RUE [place] - no DVT, EMG- normal. He is currently using Tylenol , gabapentin and muscle relaxer. He was seen by ortho, x-rays were obtained, diagnosed with De Quervain's tenosynovitis. He was treated with steroid injection and was fitted for brace. Patient reports no relief with either intervention. Plans for CT of his arm to rule any ominous concerns , most likely OA flare. Would like him to start Cymbalta.   He over all looks well. His appetite and weight have been stable. He denies any fever , chills, n/v/d. He has been that active and is inquiring when he can start lifting weights . Encouraged him to continue to keep a weight lifting restriction and focus on his cardiovascular health , he should start a walking program. Blood pressures have been elevated SBP 140-160, he is currently not using any antihypertensive , start amlodipine. He has been having elevated blood glucose , having some confusion with sliding scale insulin dosage, working with Pharmacy and will need Endocrine follow up. Plans to change Tacrolimus to Envarsus today ,will stop MMF. RTC at 3 months post op or PRN.     Objective    Vitals:    04/08/22 0822   BP: 164/75   Pulse: 67   Temp: 36.1 ??C (97 ??F)   TempSrc: Tympanic   Weight: 86.7 kg (191 lb 1.6 oz)   Height: 172.7 cm (5' 7.99)      Body mass index is 29.06 kg/m??.     Physical Exam:    General Appearance:   No acute distress  Lungs:                Clear to auscultation bilaterally  Heart:                           Regular rate and rhythm  Abdomen:                Soft, non-tender, non-distended  Extremities:              Warm and well perfused  Incision: clean/dry/intact  Musculoskeletal: + Phalen test , pain with ROM, normal strength grips        Data Review:  All lab results last 24 hours:  No results found for this or any previous visit (from the past 24 hour(s)).

## 2022-04-08 NOTE — Unmapped (Signed)
Bed: HALL-01A  Expected date:   Expected time:   Means of arrival:   Comments:

## 2022-04-08 NOTE — Unmapped (Addendum)
[facility] Pharmacist has reviewed a new prescription for tacrolimus that indicates a dose decrease.  Patient was counseled on this dosage change by rph Hazle Quant- see epic note from 8/28.  Rx added back into work order        Clinical Assessment Needed For: Dose Change  Medication: Tacrolimus 1 mg capsules  Last Fill Date/Day Supply: 03/14/22 / 30  Copay $0  Was previous dose already scheduled to fill: Yes    Notes to Pharmacist: Scheduled to fill TODAY!

## 2022-04-08 NOTE — Unmapped (Signed)
Please see patient pharmacy visit for documentation.

## 2022-04-08 NOTE — Unmapped (Addendum)
Received page re.critical K today at 6.7. Patient just seen in clinic and denied any sx and now headed to [facility] for a scheduled CT. Discussed with Dr.Kapoor, who recommended he head back to [facility] for K recheck, and that the [facility] team should be consulted on his arrival. He also recommended patient double his daily dose of Veltassa to 16.8 g. CT still needed to r/o potential necrosis, but may need to be rescheduled.    Spoke with patient, who verbalized concern that this would interfere with his wife's flight plans tomorrow. Explained the urgency of dealing with his elevated K. He verbalized agreement to come to [facility] ED now.     Notified [facility]-ED Charge, Helmut Muster.    Patient returned call to say that he was diverted from the ED d/t a security issue on campus. Instructed him to go back to [facility] ED. Explained that his arm CT may need to be rescheduled. He verbalized understanding.

## 2022-04-08 NOTE — Unmapped (Signed)
Pt presents from home for c/o abnormal lab. Was in transplant clinic today and had labs drawn. K 6.7 (5.4 on 08/24). Denies any CP.

## 2022-04-08 NOTE — Unmapped (Signed)
[facility]  Emergency Department Provider Note     ED Clinical Impression     Final diagnoses:   Hyperkalemia (Primary)      Impression, Medical Decision Making, ED Course     Impression: 61 y.o. 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      DDx/MDM: Differential diagnosis includes electrolyte abnormalities, metabolic abnormalities, among other etiologies. Will evaluate with basic labs, EKG. Will treat with dextrose 50% in water and insulin injection and calcium for cardioprotection.  Per request will reach out to transplant surgery to discuss patient further.    Diagnostic workup as below.    Orders Placed This Encounter   Procedures    Comprehensive metabolic panel    Magnesium Level    ED Extra Tubes    Basic metabolic panel    ECG 12 Lead       ED Course as of 04/09/22 1836   Mon Apr 08, 2022   1600 Comprehensive metabolic panel(!):    Sodium 140   Potassium 5.9(!)   Chloride 113(!)   CO2 20.3   Anion Gap 7   Bun 34(!)   Creatinine 1.86(!)   BUN/Creatinine Ratio 18   eGFR CKD-EPI (2021) Male 40(!)   Glucose 171   Calcium 9.9   Albumin 4.1   Total Protein 6.7   Total Bilirubin 0.3   SGOT (AST) 12   ALT 22   Alkaline Phosphatase 152(!)  Notable for potassium 5.9, creatinine 1.86 which appears to be baseline.   1600 Magnesium(!): 1.4   1705 Paged SRF   1828 JIH called back and spoke with the charge and stated I cannot talk to you. Jen Mow.   1858 Patient attending to discuss.  Repeat potassium 5.4.   1916 Paged fellow.    1923 Transplant coordinator who asked if we could get the CT upper extremity.   1951 Discussed with patient the CT upper extremity, as this was previously decided to be outpatient, patient would like to be discharged home at this time and continue with the outpatient plan, patient needs to get the spouse at the airport.  We did offer to obtain the CT, patient and family member at bedside politely declined.  I think this is an appropriate course of action given the outpatient plan previous.       Independent Interpretation of Studies: I have independently interpreted the following studies:  See ED course    Discussion of Management With Other Providers or Support Staff: I discussed the management of this patient with the:  SRF  ____________________________________________    The case was discussed with the attending physician, who is in agreement with the above assessment and plan.      History     Chief Complaint  Chief Complaint   Patient presents with    Abnormal Lab       HPI   Matthew Key is a 61 y.o. male with past medical history as below who presents with elevated potassium. The patient reports to the ED per the recommendation of the Liver Transplant Clinic due to elevated potassium levels to, per chart, 6.7, in the setting of a liver transplant 2 months ago (02/06/2022) and a hospital admission for elevated liver enzymes on 03/19/2022. The patient reports that he takes 1 packet of Veltassa daily, but was directed today by his PCP to start  taking 2 packets due to his potassium levels. The patient was scheduled for a CT to evaluate for possible necrosis, but was unable to make this appointment due to visiting the ED. Denies chest pain, sob, abdominal pain, fever, urinary symptoms, abnormal bowel movements, or viral symptoms.     Per wife at bedside, the patient is currently on Tacrolimus and Bactrim. Per chart, the patient's K was elevated to 5.7 3 days ago on 04/05/2022 and was directed to continue to take the 8.4g daily Veltassa dose, increase his Mg to 2 tabs twice daily.       Outside Historian(s): I have obtained additional history/collateral from wife at bedside.    External Records Reviewed: I have reviewed 04/08/2022 Telephone Encounter with [facility] (details patient's potassium levels and reason for referral to the ED today)    Past Medical History:   Diagnosis Date    AKI (acute kidney injury) (CMS-HCC) 12/14/2020    Anxiety 10/22/2013    Arthritis     Cervical radiculopathy 12/03/2016    Chronic pain disorder     Lower back    Cirrhosis (CMS-HCC)     Dental abscess 10/2020    Duodenal ulcer 12/01/2017    GERD (gastroesophageal reflux disease)     History of transfusion     Hyperlipidemia 10/22/2013    Hypertension     under control with meds and weight loss    Hypotension 01/29/2022    Liver disease     Sleep apnea, obstructive     Have machine    Stroke (CMS-HCC)     mild stroke    Type 2 diabetes mellitus with diabetic neuropathy, with long-term current use of insulin (CMS-HCC) 06/09/2014       Past Surgical History:   Procedure Laterality Date    KNEE SURGERY      PR CATH PLACE/CORON ANGIO, IMG SUPER/INTERP,R&L HRT CATH, L HRT VENTRIC N/A 12/19/2020    Procedure: CATH LEFT/RIGHT HEART CATHETERIZATION;  Surgeon: Rosana Hoes, MD;  Location: [facility] CATH;  Service: Cardiology    PR COLONOSCOPY FLX DX W/COLLJ SPEC WHEN PFRMD N/A 12/07/2021    Procedure: COLONOSCOPY, FLEXIBLE, PROXIMAL TO SPLENIC FLEXURE; DIAGNOSTIC, W/WO COLLECTION SPECIMEN BY BRUSH OR WASH;  Surgeon: Annie Paras, MD;  [facility]: [facility];  Service: Gastroenterology    PR TRANSPLANT LIVER,ALLOTRANSPLANT N/A 02/06/2022    Procedure: LIVER ALLOTRANSPLANTATION; ORTHOTOPIC, PARTIAL OR WHOLE, FROM CADAVER OR LIVING DONOR, ANY AGE;  Surgeon: Florene Glen, MD;  Location: MAIN OR [employer];  Service: Transplant    PR TRANSPLANT,PREP DONOR LIVER/ARTERIAL N/A 02/06/2022    Procedure: BACKBNCH RECONSTRUCT OF CAD/LIVE DONOR LIVER GFT PRIOR TRANSPLANT; ARTERIAL ANASTAMOSIS, EA;  Surgeon: Florene Glen, MD;  Location: MAIN OR [place];  Service: Transplant    PR UPPER GI ENDOSCOPY,BIOPSY N/A 10/12/2020    Procedure: UGI ENDOSCOPY; WITH BIOPSY, SINGLE OR MULTIPLE;  Surgeon: Marene Lenz, MD;  Location: [facility]; Service: Gastroenterology    PR UPPER GI ENDOSCOPY,DIAGNOSIS N/A 01/03/2022    Procedure: UGI ENDO, INCLUDE ESOPHAGUS, STOMACH, & DUODENUM &/OR JEJUNUM; DX W/WO COLLECTION SPECIMN, BY BRUSH OR WASH;  Surgeon: Marene Lenz, MD;  [facility]: [facility];  Service: Gastroenterology    PR UPPER GI ENDOSCOPY,LIGAT VARIX N/A 12/07/2021    Procedure: UGI ENDO; Everlene Balls LIG ESOPH &/OR GASTRIC VARICES;  Surgeon: Annie Paras, MD;  Location: [facility];  Service: Gastroenterology    ROOT CANAL  Front teeth       No current facility-administered medications for this encounter.    Current Outpatient Medications:     acetaminophen (TYLENOL) 325 MG tablet, Take 1-2 tablets (325-650 mg total) by mouth every six (6) hours as needed for pain or fever., Disp: 100 tablet, Rfl: 11    amLODIPine (NORVASC) 5 MG tablet, Take 1 tablet (5 mg total) by mouth daily., Disp: 30 tablet, Rfl: 11    aspirin (ECOTRIN) 81 MG tablet, Take 1 tablet (81 mg total) by mouth daily., Disp: 90 tablet, Rfl: 3    atorvastatin (LIPITOR) 20 MG tablet, Take 1 tablet (20 mg total) by mouth daily. Hold until directed to take by nurse coordinator, Disp: 30 tablet, Rfl: 0    atovaquone (MEPRON) 750 mg/5 mL suspension, Take 10 mL (1,500 mg total) by mouth daily., Disp: 300 mL, Rfl: 3    blood sugar diagnostic (ACCU-CHEK GUIDE TEST STRIPS) Strp, Use to check blood sugar as directed with insulin 3 times a day & for symptoms of high or low blood sugar, Disp: 100 strip, Rfl: 11    blood-glucose meter Misc, Use to test blood sugar as directed, Disp: 1 each, Rfl: 0    docusate sodium (COLACE) 100 MG capsule, Take 1 capsule (100 mg total) by mouth two (2) times a day as needed for constipation., Disp: 60 capsule, Rfl: 11    DULoxetine (CYMBALTA) 30 MG capsule, Take 1 capsule (30 mg total) by mouth daily., Disp: 90 capsule, Rfl: 3    everolimus, immunosuppressive, 1 mg Tab, Take 1 mg by mouth Two (2) times a day., Disp: 60 tablet, Rfl: 11    gabapentin (NEURONTIN) 300 MG capsule, Take 1 capsule (300 mg total) by mouth Three (3) times a day., Disp: 90 capsule, Rfl: 2    glucagon spray 3 mg/actuation Spry, Use 1 spray into the left nostril every fifteen (15) minutes as needed (low blood sugar)., Disp: 2 each, Rfl: 1    hydrOXYzine (VISTARIL) 50 MG capsule, Take 1 capsule (50 mg total) by mouth nightly as needed for anxiety., Disp: 30 capsule, Rfl: 3    insulin ASPART (NOVOLOG FLEXPEN) 100 unit/mL (3 mL) injection pen, Inject under the skin per sliding scale prior to meals. If premeal BG 151-200 take 2 additional units for correction, if 201-250 take 4 additional units, etc. Max dose 36 units per day.  Store in-use prefilled pens at room temperature <86??F and use within 28 days; do not refrigerate., Disp: 15 mL, Rfl: 6    insulin degludec (TRESIBA FLEXTOUCH U-100) 100 unit/mL (3 mL) InPn, Inject 0.15 mL (15 Units total) under the skin daily. Adjust as instructed., Disp: 15 mL, Rfl: 3    lancets Misc, Use to check blood sugar as directed with insulin 3 times a day & for symptoms of high or low blood sugar., Disp: 100 each, Rfl: 0    magnesium oxide-Mg AA chelate (MAGNESIUM, AMINO ACID CHELATE,) 133 mg, Take 2 tablets by mouth two (2) times a day., Disp: 360 tablet, Rfl: 3    melatonin 3 mg Tab, Take 1 tablet (3 mg total) by mouth every evening., Disp: 30 tablet, Rfl: 11    methocarbamoL (ROBAXIN) 500 MG tablet, Take 1 tablet (500 mg total) by mouth Three (3) times a day as needed., Disp: 90 tablet, Rfl: 0    pantoprazole (PROTONIX) 40 MG tablet, Take 1 tablet (40 mg total) by mouth daily as needed., Disp: 30 tablet, Rfl: 11    patiromer  calcium sorbitex (VELTASSA) 16.8 gram powder packet, Take 1 packet (16.8 g total) by mouth daily., Disp: 30 packet, Rfl: 2    pen needle, diabetic (BD ULTRA-FINE NANO PEN NEEDLE) 32 gauge x 5/32 (4 mm) Ndle, Use as directed  1-2 times per day, Disp: 100 each, Rfl: 12    tacrolimus (PROGRAF) 1 MG capsule, Take 7 capsules (7 mg total) by mouth two (2) times a day., Disp: 480 capsule, Rfl: 11    valGANciclovir (VALCYTE) 450 mg tablet, Take 1 tablet (450 mg total) by mouth daily., Disp: 30 tablet, Rfl: 1    Allergies  Venom-honey bee    Family History  Family History   Problem Relation Age of Onset    Edema Mother     Alzheimer's disease Father     Aortic dissection Brother     Early death Brother     Aneurysm Brother        Social History  Social History     Tobacco Use    Smoking status: Never    Smokeless tobacco: Never   Vaping Use    Vaping Use: Never used   Substance Use Topics    Alcohol use: Not Currently    Drug use: Not Currently        Physical Exam     VITAL SIGNS:      Vitals:    04/08/22 1444 04/08/22 1840 04/08/22 2003   BP: 155/103 164/90 171/86   Pulse: 79 83 76   Resp: 22 18 16    Temp: 36.8 ??C (98.2 ??F)     TempSrc: Oral     SpO2: 100% 99% 99%     Constitutional: Alert and oriented. No acute distress.  Eyes: Conjunctivae are normal.  HEENT: Normocephalic and atraumatic. Conjunctivae clear. No congestion. Moist mucous membranes.   Cardiovascular: Rate as above, regular rhythm. Normal and symmetric distal pulses. Brisk capillary refill. Normal skin turgor.  Respiratory: Normal respiratory effort. Breath sounds are normal. There are no wheezing or crackles heard.  Gastrointestinal: Soft, non-distended, non-tender.  Genitourinary: Deferred.  Musculoskeletal: Non-tender with normal range of motion in all extremities.  Neurologic: Normal speech and language. No gross focal neurologic deficits are appreciated. Patient is moving all extremities equally, face is symmetric at rest and with speech.  Skin: Skin is warm, dry and intact. No rash noted.  Psychiatric: Mood and affect are normal. Speech and behavior are normal.     Radiology     No orders to display       Pertinent labs & imaging results that were available during my care of the patient were independently interpreted by me and considered in my medical decision making (see chart for details).    Portions of this record have been created using Scientist, clinical (histocompatibility and immunogenetics). Dictation errors have been sought, but may not have been identified and corrected.    Documentation assistance was provided by Lance Morin, Scribe, on April 08, 2022 at 3:53 PM for Cheryle Horsfall, MD.     Documentation assistance provided by the above mentioned scribe. I was present during the time the encounter was recorded. The information recorded by the scribe was done at my direction and has been reviewed and validated by me.          Cheryle Horsfall, MD  Resident  04/09/22 [phone]

## 2022-04-08 NOTE — Unmapped (Signed)
Matthew Key    Medication changes today:   Start Everolimus 1 mg BID (goal 3-8 ng/ml)  Stop Mycophenolate Mofetil 750 mg BID  Decrease tacrolimus to 7 mg BID (6-9 ng/ml until Everolimus at goal)  Start Cymbalta 30 mg daily  Start Amlodipine 5 mg daily  Change pantoprazole from daily to prn    Education/Adherence tools provided today:  - Provided updated medication list  - Provided additional education on immunosuppression and transplant related medications including reviewing indications of medications, dosing and side effects  - Provided additional pill box education  - Provided assistance with pill box fill    Follow up items:  Goal of understanding indications and dosing of immunosuppression medications  Everolimus trough goal 5 days s/p intiiation  Tac trough goal once Everolimus at goal  Electrolytes - K+ and Bactrim/consider alternative PCP PPX  BP s/p amlodipine initiation  Pain in left arm s/p Cymbalta; consider stopping gabapentin if not helping  Heartburn s/p pantoprazole switch from daily tom prn  Encourage pt to schedule f/u with endocrinology    Next visit with pharmacy in 1-2 weeks  ____________________________________________________________________    Matthew Key is a 61 y.o. 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     Immunologic Risk: first transplant    Induction Agent : basiliximab    Donor Factors: DBD    Other PMH significant for Hypertension and Stroke, T2DM, OSA on CPAP    Post op course uncomplicated    Rejection History: NTD  Infection History: NTD  ___________________________________________________________________    Last seen by pharmacy ~ 1 months ago    Interval History:   8/8-8/9 hospitalized due to elevated liver enzymes; He was evaluated with a liver doppler ultrasound which showed patent veins and arteries. Labs were obtained and showed improvement in LFTs.  8/10: EMG with no findings of neuropathy  8/17: Instructed to increase Tresiba from 15 to 17 units daily; start K-lowering supplement.  8/25: increase Mg to 2 tabs BID, continue daily veltassa    Seen by pharmacy today for: medication management and pill box fill and adherence education    CC:  Patient complains of continued back and hand/arm pain    General:  Back and hand/arm pain  Neuro: No issues and Tremors - mild  CV: No issues   Resp: No issues   GI: No issues   GU: No issues  Derm: No issues  Psych: No issues.    Fluid Status:   Edema no  SOB no  Intake: not assessed  Output: not assessed  Plan: continue to monitor      Vitals:    04/08/22 0847   BP: 164/75   Pulse: 67   Temp: 36.1 ??C (97 ??F)     ___________________________________________________________________    Allergies   Allergen Reactions    Venom-Honey Bee Swelling       Medications reviewed in EPIC medication station and updated today by the clinical pharmacist practitioner.    Current Outpatient Medications   Medication Instructions    acetaminophen (TYLENOL) 325-650 mg, Oral, Every 6 hours PRN    amLODIPine (NORVASC) 5 mg, Oral, Daily (standard)    aspirin (ECOTRIN) 81 mg, Oral, Daily (standard)    atorvastatin (LIPITOR) 20 mg, Oral, Daily (standard), Hold until directed to take by nurse coordinator    atovaquone [facility]) 1,500 mg, Oral, Daily (standard)    blood sugar  diagnostic (ACCU-CHEK GUIDE TEST STRIPS) Strp Use to check blood sugar as directed with insulin 3 times a day & for symptoms of high or low blood sugar    blood-glucose meter Misc Use to test blood sugar as directed    docusate sodium (COLACE) 100 mg, Oral, 2 times a day PRN    DULoxetine (CYMBALTA) 30 mg, Oral, Daily (standard)    gabapentin (NEURONTIN) 300 mg, Oral, 3 times a day (standard)    glucagon spray 3 mg/actuation Spry Use 1 spray into the left nostril every fifteen (15) minutes as needed (low blood sugar).    hydrOXYzine (VISTARIL) 50 mg, Oral, Nightly PRN    insulin ASPART (NOVOLOG FLEXPEN) 100 unit/mL (3 mL) injection pen Inject under the skin per sliding scale prior to meals. If premeal BG 151-200 take 2 additional units for correction, if 201-250 take 4 additional units, etc. Max dose 36 units per day.  Store in-use prefilled pens at room temperature <86??F and use within 28 days; do not refrigerate.    insulin degludec (TRESIBA FLEXTOUCH U-100) 15 Units, Subcutaneous, Daily (standard), Adjust as instructed.    lancets Misc Use to check blood sugar as directed with insulin 3 times a day & for symptoms of high or low blood sugar.    magnesium oxide-Mg AA chelate (MAGNESIUM, AMINO ACID CHELATE,) 133 mg 2 tablets, Oral, 2 times a day    melatonin 3 mg, Oral, Every evening    methocarbamoL (ROBAXIN) 500 mg, Oral, 3 times a day PRN    pantoprazole (PROTONIX) 40 mg, Oral, Daily PRN    patiromer calcium sorbitex (VELTASSA) 16.8 g, Oral, Daily (standard)    pen needle, diabetic (BD ULTRA-FINE NANO PEN NEEDLE) 32 gauge x 5/32 (4 mm) Ndle Use as directed  1-2 times per day    sulfamethoxazole-trimethoprim (BACTRIM) 400-80 mg per tablet 80 mg of trimethoprim, Oral, 3 times weekly    tacrolimus (PROGRAF) 7 mg, Oral, 2 times a day    valGANciclovir (VALCYTE) 450 mg, Oral, Daily (standard)    ZORTRESS 1 mg, Oral, 2 times a day (standard)       GRAFT FUNCTION: stable    Lab Results   Component Value Date    AST 12 04/08/2022    AST 17 04/04/2022    ALT 22 04/08/2022    ALT 29 04/04/2022    Total Bilirubin 0.3 04/08/2022    Total Bilirubin 0.3 04/04/2022      Zero hour biopsy: mild reperfusion injury on donor graft. No malignancy on explanted liver.  Biopsies to date: NTD    Renal Function: improving but above pre txp baseline of 0.9    Lab Results   Component Value Date    Creatinine 1.77 (H) 04/08/2022    Creatinine 1.86 (H) 04/08/2022    Creatinine 1.86 (H) 04/08/2022    Creatinine 1.80 (H) 04/04/2022    Creatinine 1.90 (H) 04/01/2022    Creatinine 2.40 (H) 03/27/2022    Creatinine 2.28 (H) 03/25/2022       Proteinuria/UPC: No/ none.  Lab Results   Component Value Date    Protein/Creatinine Ratio, Urine 0.156 04/08/2022       CURRENT IMMUNOSUPPRESSION:    Tacrolimus (Prograf) 8 mg BID  Tacrolimus Goal: 8 - 10   Mycophenolate mofetil (Cellcept) 1000 mg BID      IMMUNOSUPPRESSION DRUG LEVELS:  Lab Results   Component Value Date    Tacrolimus, Trough 11.4 03/20/2022    Tacrolimus, Trough 11.7 03/13/2022  Tacrolimus, Trough 9.8 03/12/2022    Tacrolimus Lvl 13.1 04/04/2022    Tacrolimus Lvl 9.7 04/01/2022    Tacrolimus Lvl 9.5 03/27/2022    Tacrolimus, Timed 6.4 04/08/2022     Prograf level is accurate 12 hour trough     WBC/ANC:  wnl  Lab Results   Component Value Date    WBC 5.7 04/08/2022    WBC 4.9 04/04/2022       Plan:  Start Everolimus 1 mg BID (goal 3-8 ng/ml)  Decrease tacrolimus to 7 mg BID   Change Tac trough goal to 6-9 ng/ml while waiting for Everolimus to reach trough goal. Once Everolimus at goal, reduce Tac trough goal to 3-5 ng/ml per [facility] abdominal transplant protocol  Stop Mycophenolate Mofetil 750 mg BID        OI Prophylaxis:   CMV Status: D+/ R+, moderate risk .   CMV prophylaxis: valganciclovir 450 mg daily x 3 months per protocol (end 05/09/22)  No results found for: CMVCP  Estimated Creatinine Clearance: 46.3 mL/min (A) (based on SCr of 1.77 mg/dL (H)).    PCP Prophylaxis: bactrim SS 1 tab MWF x 6 months (end 08/08/22)  Patient has been having issues with hyperkalemia, and reasonable/appropriate to look into alternative forms of PCP prophylaxis    Thrush: completed in hospital    Patient is  tolerating infectious prophylaxis well    Plan: Continue per protocol. Consider atovaquone or inhaled pentamidine at future visit given current hyperkalemia concerns. Continue to monitor.      CVA history: asa 81 mg   The 10-year ASCVD risk score (Arnett DK, et al., 2019) is: 22.6%  No results found for: LIPID    Statin therapy: Indicated; currently on atorvastatin 20 mg daily  Plan: Consider increasing to atorvastatin 40 mg daily at next visit. Continue to monitor       BP: Goal < 140/90. Clinic vitals 164/75  Home BP ranges: no log today in clinic, however reports BP same at home as it was today in clinic  Current meds include: none  Plan: out of goal. Start amlodipine 5 mg daily. Continue to monitor    Anemia of CKD:  H/H:   Lab Results   Component Value Date    HGB 10.9 (L) 04/08/2022     Lab Results   Component Value Date    HCT 33.0 (L) 04/08/2022     Iron panel:  Lab Results   Component Value Date    IRON 85 03/11/2022    TIBC 279 03/11/2022    FERRITIN 250.9 03/11/2022     Lab Results   Component Value Date    Iron Saturation (%) 30 03/11/2022     Prior ESA use: none post transplnat  Plan: stable. No change.  Continue to monitor.     DM:   Lab Results   Component Value Date    A1C 5.2 02/05/2022   . Goal A1c < 7  History of Dm? Yes: T2DM  Established with endocrinologist/PCP for BG managment? Yes: Dr. Melrose Nakayama with [facility]  Currently on: Tresiba 15 units daily, Novolog SSI 2: 50 > 150 mg/dl  Pt was supposed to increase dose to 17 units, however reports still taking 15 units daily  Unclear whether patient doing SSI as directed, reports doing 2 units if BG > 100, however SSI should not be starting until BG > 150 mg/dl  Patient denies any episodes of hypoglycemia since last clinic visit  Previous meds: was on Ozempic  1 mg weekly until stopped 6/19 for hypotension    Home BS log:  - Fasting ~130 (Low 86, High 170)  - PP 150 - 250    Diet: small portions for breakfast/lunch and normal portion for dinner.  Also drinking Ensure  Exercise:not yet  Hypoglycemia: no  Plan: No changes at this time. More concerned about patient being hypoglycemic than hyperglycemic. Provided extensive education on SSI and how to dose. Continue to monitor.       Electrolytes:  K is high . Mag low.  Lab Results   Component Value Date    Potassium 5.4 (H) 04/08/2022    Potassium 5.7 (H) 04/04/2022    Potassium, Bld 4.2 02/06/2022    Sodium 141 04/08/2022    Sodium 142 04/04/2022    Sodium Whole Blood 141 02/06/2022    Magnesium 1.4 (L) 04/08/2022    Magnesium 1.4 (L) 04/04/2022    CO2 20.2 04/08/2022    CO2 19 (L) 04/04/2022     Meds currently on: Mg plus protein 2 tabs BID (pt struggled to verify if he is actually taking this dose, which was increased on 04/05/22), Veltassa 8.4 g daily   Plan: Continue to monitor    GI/BM: pt reports  having normal bowel movements  Meds currently on: docusate PRN (at least 1x/daily), Miralax PRN (at least 1x/daily), pantoprazole 40 mg daily  Plan: Change pantoprazole from scheduled to prn. Continue to monitor    Pain: pt reports moderate pain despite use of below regimen    Meds currently on: APAP PRN, gabapentin 300 mg HS, methocarbamol 500 mg TID prn  Plan: Start Cymbalta 30 mg daily. Consider stopping gabapentin at next visit and pt has concerns about whether it is providing much benefit. Continue to monitor    Urinary retention: started post top after failed trial of void  Meds currently on: none (tamsulosin discontinued at previous clinic visit)  Plan: Continue to monitor    Bone health:   Vitamin D Level: none available. Goal > 30.   Lab Results   Component Value Date    Vitamin D Total (25OH) 27.9 09/11/2020       Lab Results   Component Value Date    Calcium 10.0 04/08/2022    Calcium 9.9 04/08/2022    Calcium 9.6 04/04/2022    Calcium 9.1 04/01/2022       Last DEXA results:  01/2020: osteopenia (spine T score -1.1, femoral neck -1.7)  Current meds include: none  Plan: Vitamin D level  needs to be drawn with next lab schedule. Consider adding Vitamin D and calcium given osteopenia if D is low.Continue to monitor.     Anxiety:  Meds currently on: hydroxyzine 50 HS PRN (rarely needs)    Women's/Men's Health:  Latavion Halls is a 61 y.o. male. Patient reports no men's/women's health issues  Plan: Continue to monitor    Immunizations:  Influenza [Annual]: Received 06/2021    PCV13: Received 11/2017  PPSV23: Received 03/2020  PCV20: Received 08/2021    Shingrix Zoster [2 doses, 2 - 6 months apart]: Received 05/2020, 07/2020    COVID-19 [3 primary doses, 2 boosters]: 1st dose given 10/2019, 2nd dose given 10/2019, 3rd dose given 05/2020, and Booster given 08/2021 (bivalent)    Immunization status: up to date and documented.    Pharmacy preference:  [facility]  Medication Refills:  N/a  Medication Access:  N/a    Adherence: Patient has poor understanding of medications; was  not able to independently identify names/doses of immunosuppressants and OI meds.  Patient  does fill their own pill box on a regular basis at home.  Patient brought medication card:no  Pill box:did not bring   Plan: Asked that he bring filled box for pharmacist review to next visit; provided extensive adherence counseling/intervention    Patient was reviewed with  Alveria Apley, NP  who was agreement with the stated plan:     During this visit, the following was completed:   BG log data assessment  BP log data assessment  Labs ordered and evaluated  complex treatment plan >1 DS   I spent a total of 40 minutes face to face with the patient delivering clinical care and providing education/counseling.    All questions/concerns were addressed to the patient's satisfaction.  __________________________________________  Pete Glatter, PharmD  PGY-2 Solid Organ Transplant Pharmacy Resident    Olivia Mackie, PharmD, CPP  Solid Organ Transplant Clinical Pharmacist Practitioner

## 2022-04-09 DIAGNOSIS — Z944 Liver transplant status: Principal | ICD-10-CM

## 2022-04-09 DIAGNOSIS — Z796 Long-term use of immunosuppressant medication: Principal | ICD-10-CM

## 2022-04-09 MED ORDER — ATOVAQUONE 750 MG/5 ML ORAL SUSPENSION
Freq: Every day | ORAL | 3 refills | 30 days | Status: CP
Start: 2022-04-09 — End: ?
  Filled 2022-04-10: qty 300, 30d supply, fill #0

## 2022-04-09 NOTE — Unmapped (Signed)
Received instruction from PharmD Chargualaf to stop patient's bactrim, that she sent a script for atavaquone to the pharmacy to check if it is covered by his insurance. Spoke to patient, who was driving home after dropping his wife off at [facility]. He was aware that he should stop the bactrim. Encouraged him to contact [facility] to get the atovaquone shipped as soon as possible. Also let him know that his future refills of veltassa would be the 16.8g dosage strength. He verbalized understanding.

## 2022-04-09 NOTE — Unmapped (Signed)
[facility] Clinical Assessment & Refill Coordination Note    Matthew Key, [place]: 1961/04/06  Phone: [phone] (home)     All above HIPAA information was verified with patient.     Was a Nurse, learning disability used for this call? No    Specialty Medication(s):   Transplant: tacrolimus 1mg , valgancyclovir 450mg , Atovaquone suspension and Zortress 1mg      Current Outpatient Medications   Medication Sig Dispense Refill   ??? acetaminophen (TYLENOL) 325 MG tablet Take 1-2 tablets (325-650 mg total) by mouth every six (6) hours as needed for pain or fever. 100 tablet 11   ??? amLODIPine (NORVASC) 5 MG tablet Take 1 tablet (5 mg total) by mouth daily. 30 tablet 11   ??? aspirin (ECOTRIN) 81 MG tablet Take 1 tablet (81 mg total) by mouth daily. 90 tablet 3   ??? atorvastatin (LIPITOR) 20 MG tablet Take 1 tablet (20 mg total) by mouth daily. Hold until directed to take by nurse coordinator 30 tablet 0   ??? atovaquone (MEPRON) 750 mg/5 mL suspension Take 10 mL (1,500 mg total) by mouth daily. 300 mL 3   ??? blood sugar diagnostic (ACCU-CHEK GUIDE TEST STRIPS) Strp Use to check blood sugar as directed with insulin 3 times a day & for symptoms of high or low blood sugar 100 strip 11   ??? blood-glucose meter Misc Use to test blood sugar as directed 1 each 0   ??? docusate sodium (COLACE) 100 MG capsule Take 1 capsule (100 mg total) by mouth two (2) times a day as needed for constipation. 60 capsule 11   ??? DULoxetine (CYMBALTA) 30 MG capsule Take 1 capsule (30 mg total) by mouth daily. 90 capsule 3   ??? everolimus, immunosuppressive, 1 mg Tab Take 1 mg by mouth Two (2) times a day. 60 tablet 11   ??? gabapentin (NEURONTIN) 300 MG capsule Take 1 capsule (300 mg total) by mouth Three (3) times a day. 90 capsule 2   ??? glucagon spray 3 mg/actuation Spry Use 1 spray into the left nostril every fifteen (15) minutes as needed (low blood sugar). 2 each 1   ??? hydrOXYzine (VISTARIL) 50 MG capsule Take 1 capsule (50 mg total) by mouth nightly as needed for anxiety. 30 capsule 3   ??? insulin ASPART (NOVOLOG FLEXPEN) 100 unit/mL (3 mL) injection pen Inject under the skin per sliding scale prior to meals. If premeal BG 151-200 take 2 additional units for correction, if 201-250 take 4 additional units, etc. Max dose 36 units per day.  Store in-use prefilled pens at room temperature <86??F and use within 28 days; do not refrigerate. 15 mL 6   ??? insulin degludec (TRESIBA FLEXTOUCH U-100) 100 unit/mL (3 mL) InPn Inject 0.15 mL (15 Units total) under the skin daily. Adjust as instructed. 15 mL 3   ??? lancets Misc Use to check blood sugar as directed with insulin 3 times a day & for symptoms of high or low blood sugar. 100 each 0   ??? magnesium oxide-Mg AA chelate (MAGNESIUM, AMINO ACID CHELATE,) 133 mg Take 2 tablets by mouth two (2) times a day. 360 tablet 3   ??? melatonin 3 mg Tab Take 1 tablet (3 mg total) by mouth every evening. 30 tablet 11   ??? methocarbamoL (ROBAXIN) 500 MG tablet Take 1 tablet (500 mg total) by mouth Three (3) times a day as needed. 90 tablet 0   ??? pantoprazole (PROTONIX) 40 MG tablet Take 1  tablet (40 mg total) by mouth daily as needed. 30 tablet 11   ??? patiromer calcium sorbitex (VELTASSA) 16.8 gram powder packet Take 1 packet (16.8 g total) by mouth daily. 30 packet 2   ??? pen needle, diabetic (BD ULTRA-FINE NANO PEN NEEDLE) 32 gauge x 5/32 (4 mm) Ndle Use as directed  1-2 times per day 100 each 12   ??? tacrolimus (PROGRAF) 1 MG capsule Take 7 capsules (7 mg total) by mouth two (2) times a day. 480 capsule 11   ??? valGANciclovir (VALCYTE) 450 mg tablet Take 1 tablet (450 mg total) by mouth daily. 30 tablet 1     No current facility-administered medications for this visit.        Changes to medications: veltassa dose increase    Allergies   Allergen Reactions   ??? Venom-Honey Bee Swelling       Changes to allergies: No    SPECIALTY MEDICATION ADHERENCE     zortress 1mg   : 25 days of medicine on hand   Tacrolimus 1mg   : 3 days of medicine on hand   Atovaquone 750mg /86ml  : 0 days of medicine on hand   valganciclovir 450mg   : 25 days of medicine on hand       Medication Adherence    Patient reported X missed doses in the last month: 0  Specialty Medication: valganciclovir 450mg   Patient is on additional specialty medications: Yes  Additional Specialty Medications: Tacrolimus 1mg   Patient Reported Additional Medication X Missed Doses in the Last Month: 0  Patient is on more than two specialty medications: Yes  Specialty Medication: atovaquone 750mg /22ml  Patient Reported Additional Medication X Missed Doses in the Last Month: 0  Specialty Medication: zortress 1mg   Patient Reported Additional Medication X Missed Doses in the Last Month: 0                      Specialty medication(s) dose(s) confirmed: Patient reports changes to the regimen as follows: stopped mycophenolate, started atovaquone (see onboarding note from today), tac now 7mg  bid , started zortress    Are there any concerns with adherence? No    Adherence counseling provided? Not needed    CLINICAL MANAGEMENT AND INTERVENTION      Clinical Benefit Assessment:    Do you feel the medicine is effective or helping your condition? Yes    Clinical Benefit counseling provided? Not needed    Adverse Effects Assessment:    Are you experiencing any side effects? No    Are you experiencing difficulty administering your medicine? No    Quality of Life Assessment:         How many days over the past month did your transplant  keep you from your normal activities? For example, brushing your teeth or getting up in the morning. 0    Have you discussed this with your provider? Not needed    Acute Infection Status:    Acute infections noted within Epic:  No active infections  Patient reported infection: None    Therapy Appropriateness:    Is therapy appropriate and patient progressing towards therapeutic goals? Yes, therapy is appropriate and should be continued    DISEASE/MEDICATION-SPECIFIC INFORMATION      N/A    PATIENT SPECIFIC NEEDS     - Does the patient have any physical, cognitive, or cultural barriers? No    - Is the patient high risk? No    - Does the patient require a  Care Management Plan? No     SOCIAL DETERMINANTS OF HEALTH     At the [facility], we have learned that life circumstances - like trouble affording food, housing, utilities, or transportation can affect the health of many of our patients.   That is why we wanted to ask: are you currently experiencing any life circumstances that are negatively impacting your health and/or quality of life? No    Social Determinants of Health     Financial Resource Strain: Low Risk  (08/28/2021)    Overall Financial Resource Strain (CARDIA)    ??? Difficulty of Paying Living Expenses: Not hard at all   Internet Connectivity: Not on file   Food Insecurity: No Food Insecurity (08/28/2021)    Hunger Vital Sign    ??? Worried About Running Out of Food in the Last Year: Never true    ??? Ran Out of Food in the Last Year: Never true   Tobacco Use: Low Risk  (04/08/2022)    Patient History    ??? Smoking Tobacco Use: Never    ??? Smokeless Tobacco Use: Never    ??? Passive Exposure: Not on file   Housing/Utilities: Low Risk  (08/28/2021)    Housing/Utilities    ??? Within the past 12 months, have you ever stayed: outside, in a car, in a tent, in an overnight shelter, or temporarily in someone else's home (i.e. couch-surfing)?: No    ??? Are you worried about losing your housing?: No    ??? Within the past 12 months, have you been unable to get utilities (heat, electricity) when it was really needed?: No   Alcohol Use: Not on file   Transportation Needs: No Transportation Needs (08/28/2021)    PRAPARE - Transportation    ??? Lack of Transportation (Medical): No    ??? Lack of Transportation (Non-Medical): No   Substance Use: Not on file   Health Literacy: Not on file   Physical Activity: Not on file   Interpersonal Safety: Not on file   Stress: Not on file   Intimate Partner Violence: Not on file   Depression: Not at risk (12/19/2020)    PHQ-2    ??? PHQ-2 Score: 0   Social Connections: Not on file       Would you be willing to receive help with any of the needs that you have identified today? Not applicable       SHIPPING     Specialty Medication(s) to be Shipped:   Transplant: Atovaquone suspension    Other medication(s) to be shipped: veltassa   Call set up for 2 weeks out for ALL other meds per patient request     Changes to insurance: No    Delivery Scheduled: Yes, Expected medication delivery date: 04/10/2022 for atovaquone, 04/12/2022 for veltassa.     Medication will be delivered via UPS to the confirmed prescription address in [facility].    The patient will receive a drug information handout for each medication shipped and additional FDA Medication Guides as required.  Verified that patient has previously received a Conservation officer, historic buildings and a Surveyor, mining.    The patient or caregiver noted above participated in the development of this care plan and knows that they can request review of or adjustments to the care plan at any time.      All of the patient's questions and concerns have been addressed.    Thad Ranger   [facility] Specialty  Pharmacist

## 2022-04-09 NOTE — Unmapped (Addendum)
The following medication is onboarded in this note:  1. Atovaquone $0/30ds  2. zortress $0/30ds      [facility]   Patient Onboarding/Medication Counseling    Matthew Key is a 61 y.o. male with liver transplant who I am counseling today on initiation of therapy.  I am speaking to the patient. Patient is aware this REPLACES bactrim.    Was a Nurse, learning disability used for this call? No    Verified patient's date of birth / HIPAA.    Specialty medication(s) to be sent: n/a- see delivery information in clinical note from today      Non-specialty medications/supplies to be sent: na      Medications not needed at this time: na         Mepron (atovaquone)    Medication & Administration     Dosage: take 10ml (1500mg ) by mouth daily    Administration:   ??? Shake gently before use  ??? Take with food  ??? Measure liquid carefully using measuring device provided with drug    Adherence/Missed dose instructions:  ??? Take a missed dose as soon as you think about it  ??? If it is close to the time for next dose, skip missed dose and resume normal schedule  ??? Do not take 2 doses at the same time or extra doses    Goals of Therapy     ??? To prevent/cure Pneumocystis jirovecii pneumonia (PJP)    Side Effects & Monitoring Parameters     ??? Common side effects  ??? Headache  ??? Nausea, vomiting, diarrhea, abdominal pain  ??? Skin rash  ??? Trouble sleeping  ??? Muscle pain or ache  ??? Flu-like symptoms- Stuffy or runny nose, cough, fever    ??? The following side effects should be reported to the provider:  ??? Allergic reaction  ??? Signs of infection  ??? Depression  ??? Thrush  ??? Dark urine, fatigue, lack of appetite, abdominal pain, light colored stool, vomiting, yellow skin/eyes    ??? Monitoring Parameters  ??? Hepatic function tests     Contraindications, Warnings, & Precautions     ??? Use caution in patients with severe hepatic impairment  ??? Use caution in elderly patients    Drug/Food Interactions     ??? Medication list reviewed in Epic. The patient was instructed to inform the care team before taking any new medications or supplements. no interactions noted that clinic is not already monitoring.    Storage, Handling Precautions, & Disposal     ??? Store at room temperature in dry location  ??? Keep away from children and pets      The patient declined counseling on medication administration, missed dose instructions, goals of therapy, side effects and monitoring parameters, warnings and precautions, drug/food interactions and storage, handling precautions, and disposal because they were counseled in clinic. The information in the declined sections below are for informational purposes only and was not discussed with patient.   zortress (Everolimus)    Medication & Administration     Dosage: 1mg  (1 capsule) by mouth once daily (about the same time every day) with a glass of water.    Administration: Take by mouth consistently with or without food.        Swallow the tablet whole and do not break, crush, or chew the tablet.  or capsule   If you take cyclosporine or tacrolimus, take them at the same time as this drug    Adherence/Missed dose instructions:  If you miss a dose,  take it as soon as you remember.  If it is close to the time for your next dose, skip the dose and go back to your normal time.  Do not take 2 doses at once to make up for a missed dose.       Goals of Therapy     Prevent disease progression    Side Effects & Monitoring Parameters   ??? Nausea/vomiting  ??? Diarrhea/constipation  ??? Mouth sores  ??? Rash  ??? Nail changes  ??? Sun precautions  ??? Infection precautions (fever > 100.5, chills, sore throat)  ??? Fatigue  ??? Anemia  ??? Bleeding precautions (excessive bruising, nose bleeds, gums bleeding)  ??? Hyperglycemia  ??? Cholesterol and triglyceride elevations  ??? Pneumonitis (cough, shortness of breath, difficulty breathing)  ??? Swelling (edema)  ??? Agitation, anxiety, unable to sleep  ??? Dry mouth or skin  ??? Pain in arms/legs, joints or back pain, or muscle spasm    The following side effects should be reported to the provider:    ??? Heartbeat that doesn't feel normal (heart feels like it's racing, skipping a beat or fluttering)  ??? Decrease in urination, change in color of urine, blood in urine   ??? Yellowing of skin or eyes, your urine appears dark or brown or pain in abdomen  ??? Signs of allergic reaction (rash, hives, shortness of breath)  ??? Unexplained cough, shortness of breath or trouble breathing  ??? Mouth sores or irritation  ??? Infection like symptoms- UTI, fever, chills, cough, sinus pain, shortness of breath  ??? Unexplained bleeding or bruising  ??? Increased blood sugar levels (increased thirst, hunger, or urinating; fruity breath)  ??? Electrolyte problems (mood changes, confusion, muscle pain/weakness, seizures, not hungry, or very bad upset stomach or throwing up)  ??? Redness/irritation on palms or soles (can use Urea cream 20%)    Monitoring Parameters:  ??? A1c at baseline and as needed  ??? Lipid panel at baseline and as needed  ??? CBC with differential baseline and every 6 months for first year of therapy and then annually  ??? Monitor Liver function tests and serum creatinine  ??? Pregnancy test for females with reproductive potential prior to treatment      Contraindications, Warnings, & Precautions     ??? Mouth sores- discussed use of baking soda/salt rinses and mouth rinses available through prescription (dexamethasone)  ??? Females of reproductive potential should be advised to avoid pregnancy and use highly effective birth control during treatment and for 8 weeks after the last dose  ??? Male patients with male partners of reproductive potential should use effective contraception during treatment and for 4 weeks after last dose.  ??? Breastfeeding is not recommended during therapy and for 2 weeks after the last dose      Drug/Food Interactions     ??? Avoid grapefruit and grapefruit juice  ??? Medication list reviewed in Epic. The patient was instructed to inform the care team before taking any new medications or supplements including over the counter medications, vitamins, and herbal supplements. no interactions noted that clinic is not already monitoring.   ??? Avoid live vaccines    Storage, Handling Precautions, & Disposal     ??? This medication should be stored at room temperature and in a dry location. Keep out of reach of others including children and pets. Keep the medicine in the original container with a child-proof top (no pillboxes). Do not throw away or flush  unused medication down the toilet or sink. This drug is considered hazardous and should be handled as little as possible.  If someone else helps with medication administration, they should wear gloves      Current Medications (including OTC/herbals), Comorbidities and Allergies     Current Outpatient Medications   Medication Sig Dispense Refill   ??? acetaminophen (TYLENOL) 325 MG tablet Take 1-2 tablets (325-650 mg total) by mouth every six (6) hours as needed for pain or fever. 100 tablet 11   ??? amLODIPine (NORVASC) 5 MG tablet Take 1 tablet (5 mg total) by mouth daily. 30 tablet 11   ??? aspirin (ECOTRIN) 81 MG tablet Take 1 tablet (81 mg total) by mouth daily. 90 tablet 3   ??? atorvastatin (LIPITOR) 20 MG tablet Take 1 tablet (20 mg total) by mouth daily. Hold until directed to take by nurse coordinator 30 tablet 0   ??? atovaquone (MEPRON) 750 mg/5 mL suspension Take 10 mL (1,500 mg total) by mouth daily. 300 mL 3   ??? blood sugar diagnostic (ACCU-CHEK GUIDE TEST STRIPS) Strp Use to check blood sugar as directed with insulin 3 times a day & for symptoms of high or low blood sugar 100 strip 11   ??? blood-glucose meter Misc Use to test blood sugar as directed 1 each 0   ??? docusate sodium (COLACE) 100 MG capsule Take 1 capsule (100 mg total) by mouth two (2) times a day as needed for constipation. 60 capsule 11   ??? DULoxetine (CYMBALTA) 30 MG capsule Take 1 capsule (30 mg total) by mouth daily. 90 capsule 3   ??? everolimus, immunosuppressive, 1 mg Tab Take 1 mg by mouth Two (2) times a day. 60 tablet 11   ??? gabapentin (NEURONTIN) 300 MG capsule Take 1 capsule (300 mg total) by mouth Three (3) times a day. 90 capsule 2   ??? glucagon spray 3 mg/actuation Spry Use 1 spray into the left nostril every fifteen (15) minutes as needed (low blood sugar). 2 each 1   ??? hydrOXYzine (VISTARIL) 50 MG capsule Take 1 capsule (50 mg total) by mouth nightly as needed for anxiety. 30 capsule 3   ??? insulin ASPART (NOVOLOG FLEXPEN) 100 unit/mL (3 mL) injection pen Inject under the skin per sliding scale prior to meals. If premeal BG 151-200 take 2 additional units for correction, if 201-250 take 4 additional units, etc. Max dose 36 units per day.  Store in-use prefilled pens at room temperature <86??F and use within 28 days; do not refrigerate. 15 mL 6   ??? insulin degludec (TRESIBA FLEXTOUCH U-100) 100 unit/mL (3 mL) InPn Inject 0.15 mL (15 Units total) under the skin daily. Adjust as instructed. 15 mL 3   ??? lancets Misc Use to check blood sugar as directed with insulin 3 times a day & for symptoms of high or low blood sugar. 100 each 0   ??? magnesium oxide-Mg AA chelate (MAGNESIUM, AMINO ACID CHELATE,) 133 mg Take 2 tablets by mouth two (2) times a day. 360 tablet 3   ??? melatonin 3 mg Tab Take 1 tablet (3 mg total) by mouth every evening. 30 tablet 11   ??? methocarbamoL (ROBAXIN) 500 MG tablet Take 1 tablet (500 mg total) by mouth Three (3) times a day as needed. 90 tablet 0   ??? pantoprazole (PROTONIX) 40 MG tablet Take 1 tablet (40 mg total) by mouth daily as needed. 30 tablet 11   ??? patiromer calcium sorbitex (VELTASSA) 16.8 gram  powder packet Take 1 packet (16.8 g total) by mouth daily. 30 packet 2   ??? pen needle, diabetic (BD ULTRA-FINE NANO PEN NEEDLE) 32 gauge x 5/32 (4 mm) Ndle Use as directed  1-2 times per day 100 each 12   ??? tacrolimus (PROGRAF) 1 MG capsule Take 7 capsules (7 mg total) by mouth two (2) times a day. 480 capsule 11   ??? valGANciclovir (VALCYTE) 450 mg tablet Take 1 tablet (450 mg total) by mouth daily. 30 tablet 1     No current facility-administered medications for this visit.       Allergies   Allergen Reactions   ??? Venom-Honey Bee Swelling       Patient Active Problem List   Diagnosis   ??? Hyponatremia   ??? Leukocytosis   ??? Non-alcoholic micronodular cirrhosis of liver (CMS-HCC)   ??? Pleural effusion, right   ??? Primary insomnia   ??? OSA on CPAP   ??? Thrombocytopenia (CMS-HCC)   ??? Type 2 diabetes mellitus without complication (CMS-HCC)   ??? Spontaneous bacterial peritonitis (CMS-HCC)   ??? 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 Left upper extremity swelling   ??? Adrenal insufficiency (CMS-HCC)   ??? Acute deep vein thrombosis (DVT) of brachial vein of left upper extremity (CMS-HCC)   ??? Acute thrombosis of right basilic vein   ??? Hypofibrinogenemia (CMS-HCC)   ??? Abdominal ascites   ??? Pleural effusion associated with hepatic disorder   ??? Essential hypertension   ??? Acute medial meniscus tear of right knee   ??? AKI (acute kidney injury) (CMS-HCC)   ??? Anxiety   ??? Cerebrovascular accident (CVA) (CMS-HCC)   ??? Cervical radicular pain   ??? Cervical radiculopathy   ??? Chronic bilateral low back pain without sciatica   ??? Neck pain, chronic   ??? COVID-19   ??? De Quervain's tenosynovitis, left   ??? Duodenal ulcer   ??? Elevated BUN   ??? Elevated liver enzymes   ??? Erectile dysfunction   ??? Esophageal varices determined by endoscopy (CMS-HCC)   ??? Fatty liver   ??? Gastroesophageal reflux disease   ??? History of stroke without residual deficits   ??? Hospital discharge follow-up   ??? Hyperlipidemia   ??? Lactic acidosis   ??? Obesity (BMI 30-39.9)   ??? Right shoulder pain   ??? Right upper quadrant abdominal pain   ??? Tubular adenoma of colon   ??? Water retention   ??? Hypertension associated with diabetes (CMS-HCC)   ??? Acute metabolic encephalopathy   ??? Decompensated hepatic cirrhosis (CMS-HCC)   ??? Hyperkalemia   ??? Hypotension   ??? Liver transplant status (CMS-HCC) Reviewed and up to date in Epic.    Appropriateness of Therapy     Acute infections noted within Epic:  No active infections  Patient reported infection: None    Is medication and dose appropriate based on diagnosis and infection status? Yes    Prescription has been clinically reviewed: Yes      Baseline Quality of Life Assessment      How many days over the past month did your transplant  keep you from your normal activities? For example, brushing your teeth or getting up in the morning. 0    Financial Information     Medication Assistance provided: None Required    Anticipated copay of $0/30ds on both medsreviewed with patient. Verified delivery address.    Delivery Information     Scheduled delivery date: see clinical note  from today for delivery info    Expected start date: atovaquone: when med arrives unless clinic wants pt to do differently, I will message clinic, zortress: patient already has on hand and is already taking    Medication will be delivered via na to the na address in [place].  This shipment will not require a signature.      Explained the services we provide at [facility] and that each month we would call to set up refills.  Stressed importance of returning phone calls so that we could ensure they receive their medications in time each month.  Informed patient that we should be setting up refills 7-10 days prior to when they will run out of medication.  A pharmacist will reach out to perform a clinical assessment periodically.  Informed patient that a welcome packet, containing information about our pharmacy and other support services, a Notice of Privacy Practices, and a drug information handout will be sent.      The patient or caregiver noted above participated in the development of this care plan and knows that they can request review of or adjustments to the care plan at any time.      Patient or caregiver verbalized understanding of the above information as well as how to contact the pharmacy at [phone] option 4 with any questions/concerns.  The pharmacy is open Monday through Friday 8:30am-4:30pm.  A pharmacist is available 24/7 via pager to answer any clinical questions they may have.    Patient Specific Needs     - Does the patient have any physical, cognitive, or cultural barriers? No    - Does the patient have adequate living arrangements? (i.e. the ability to store and take their medication appropriately) Yes    - Did you identify any home environmental safety or security hazards? No    - Patient prefers to have medications discussed with  Patient     - Is the patient or caregiver able to read and understand education materials at a high school level or above? Yes    - Patient's primary language is  English     - Is the patient high risk? Yes, patient is taking a REMS drug. Medication is dispensed in compliance with REMS program    SOCIAL DETERMINANTS OF HEALTH     At the [facility], we have learned that life circumstances - like trouble affording food, housing, utilities, or transportation can affect the health of many of our patients.   That is why we wanted to ask: are you currently experiencing any life circumstances that are negatively impacting your health and/or quality of life? No    Social Determinants of Health     Financial Resource Strain: Low Risk  (08/28/2021)    Overall Financial Resource Strain (CARDIA)    ??? Difficulty of Paying Living Expenses: Not hard at all   Internet Connectivity: Not on file   Food Insecurity: No Food Insecurity (08/28/2021)    Hunger Vital Sign    ??? Worried About Running Out of Food in the Last Year: Never true    ??? Ran Out of Food in the Last Year: Never true   Tobacco Use: Low Risk  (04/08/2022)    Patient History    ??? Smoking Tobacco Use: Never    ??? Smokeless Tobacco Use: Never    ??? Passive Exposure: Not on file   Housing/Utilities: Low Risk  (08/28/2021)    Housing/Utilities    ???  Within the past 12 months, have you ever stayed: outside, in a car, in a tent, in an overnight shelter, or temporarily in someone else's home (i.e. couch-surfing)?: No    ??? Are you worried about losing your housing?: No    ??? Within the past 12 months, have you been unable to get utilities (heat, electricity) when it was really needed?: No   Alcohol Use: Not on file   Transportation Needs: No Transportation Needs (08/28/2021)    PRAPARE - Transportation    ??? Lack of Transportation (Medical): No    ??? Lack of Transportation (Non-Medical): No   Substance Use: Not on file   Health Literacy: Not on file   Physical Activity: Not on file   Interpersonal Safety: Not on file   Stress: Not on file   Intimate Partner Violence: Not on file   Depression: Not at risk (12/19/2020)    PHQ-2    ??? PHQ-2 Score: 0   Social Connections: Not on file       Would you be willing to receive help with any of the needs that you have identified today? Not applicable       Thad Ranger  [facility] Specialty Pharmacist

## 2022-04-09 NOTE — Unmapped (Signed)
Addended by: Genia Harold on: 04/08/2022 08:13 PM     Modules accepted: Orders

## 2022-04-09 NOTE — Unmapped (Signed)
Patient seen in clinic today accompanied by his wife for liver transplant follow up. He reports he is doing well, denying any n/v/d/dysuria/dizziness/sob or incisional pain. He continues to report pain to his right arm/forearm/hand and is scheduled for CT evaluation today. He reports he is hydrating well with at least 80 oz of fluid daily. Last week's Tac level resulted high at >13, but he reported it was reliable. He was seen by pharmacy and PA Czepiga. Plan for him to rtc in one month for his 3 mos f/up. He was started on amlodipine for elevated bps and cymbalta for c/o arm pain. Pharmacist encouraged him to f/up with endocrinology for his glucose management. He was instructed to start everolimus 1mg  bid today and reduce his tac to 7mg  bid. He will continue with twice weekly labs. Wife reports she will be out of town, starting tomorrow for 3 weeks. Sister will stay with patient during that time. Patient and wife verbalized understanding of all discussed.

## 2022-04-09 NOTE — Unmapped (Signed)
[facility] Specialty Medication Onboarding    Specialty Medication: atovaquone 750 mg/5 mL suspension (MEPRON)  Prior Authorization: Not Required   Financial Assistance: No - copay  <$25  Final Copay/Day Supply: $0 / 30    Insurance Restrictions: None     Notes to Pharmacist:     The triage team has completed the benefits investigation and has determined that the patient is able to fill this medication at [facility]. Please contact the patient to complete the onboarding or follow up with the prescribing physician as needed.

## 2022-04-11 DIAGNOSIS — Z796 Long-term use of immunosuppressant medication: Principal | ICD-10-CM

## 2022-04-11 DIAGNOSIS — Z944 Liver transplant status: Principal | ICD-10-CM

## 2022-04-11 LAB — CBC W/ DIFFERENTIAL
BASOPHILS ABSOLUTE COUNT: 0 10*3/uL (ref 0.0–0.2)
BASOPHILS RELATIVE PERCENT: 0 %
EOSINOPHILS ABSOLUTE COUNT: 0.2 10*3/uL (ref 0.0–0.4)
EOSINOPHILS RELATIVE PERCENT: 3 %
HEMATOCRIT: 30.2 % — ABNORMAL LOW (ref 37.5–51.0)
HEMOGLOBIN: 9.9 g/dL — ABNORMAL LOW (ref 13.0–17.7)
LYMPHOCYTES ABSOLUTE COUNT: 1.2 10*3/uL (ref 0.7–3.1)
LYMPHOCYTES RELATIVE PERCENT: 23 %
MEAN CORPUSCULAR HEMOGLOBIN CONC: 32.8 g/dL (ref 31.5–35.7)
MEAN CORPUSCULAR HEMOGLOBIN: 29 pg (ref 26.6–33.0)
MEAN CORPUSCULAR VOLUME: 89 fL (ref 79–97)
MONOCYTES ABSOLUTE COUNT: 0.3 10*3/uL (ref 0.1–0.9)
MONOCYTES RELATIVE PERCENT: 6 %
NEUTROPHILS ABSOLUTE COUNT: 3.1 10*3/uL (ref 1.4–7.0)
NEUTROPHILS RELATIVE PERCENT: 62 %
PLATELET COUNT: 166 10*3/uL (ref 150–450)
RED BLOOD CELL COUNT: 3.41 x10E6/uL — ABNORMAL LOW (ref 4.14–5.80)
RED CELL DISTRIBUTION WIDTH: 13.2 % (ref 11.6–15.4)
WHITE BLOOD CELL COUNT: 5 10*3/uL (ref 3.4–10.8)

## 2022-04-11 LAB — COMPREHENSIVE METABOLIC PANEL
A/G RATIO: 2 (ref 1.2–2.2)
ALBUMIN: 4.4 g/dL (ref 3.9–4.9)
ALKALINE PHOSPHATASE: 143 IU/L — ABNORMAL HIGH (ref 44–121)
ALT (SGPT): 12 IU/L (ref 0–44)
AST (SGOT): 13 IU/L (ref 0–40)
BILIRUBIN TOTAL (MG/DL) IN SER/PLAS: 0.3 mg/dL (ref 0.0–1.2)
BLOOD UREA NITROGEN: 31 mg/dL — ABNORMAL HIGH (ref 8–27)
BUN / CREAT RATIO: 18 (ref 10–24)
CALCIUM: 9.8 mg/dL (ref 8.6–10.2)
CHLORIDE: 105 mmol/L (ref 96–106)
CO2: 18 mmol/L — ABNORMAL LOW (ref 20–29)
CREATININE: 1.73 mg/dL — ABNORMAL HIGH (ref 0.76–1.27)
EGFR: 44 mL/min/{1.73_m2} — ABNORMAL LOW
GLOBULIN, TOTAL: 2.2 g/dL (ref 1.5–4.5)
GLUCOSE: 136 mg/dL — ABNORMAL HIGH (ref 70–99)
POTASSIUM: 5.7 mmol/L — ABNORMAL HIGH (ref 3.5–5.2)
SODIUM: 140 mmol/L (ref 134–144)
TOTAL PROTEIN: 6.6 g/dL (ref 6.0–8.5)

## 2022-04-11 LAB — IMMATURE CELLS
METAMYLOCYTES-LABCORP: 3 % — ABNORMAL HIGH (ref 0–0)
MYELOCYTES: 3 % — ABNORMAL HIGH (ref 0–0)

## 2022-04-11 LAB — MAGNESIUM: MAGNESIUM: 1.4 mg/dL — ABNORMAL LOW (ref 1.6–2.3)

## 2022-04-11 LAB — BILIRUBIN, DIRECT: BILIRUBIN DIRECT: 0.11 mg/dL (ref 0.00–0.40)

## 2022-04-11 LAB — GAMMA GT: GAMMA GLUTAMYL TRANSFERASE: 35 IU/L (ref 0–65)

## 2022-04-11 LAB — PHOSPHORUS: PHOSPHORUS, SERUM: 4.8 mg/dL — ABNORMAL HIGH (ref 2.8–4.1)

## 2022-04-11 NOTE — Unmapped (Signed)
Reviewed 8/29 elevated potassium with Cecilie Lowers PharmD who recommended increasing Veltassa to 2 packets a day. Called pt who reported getting a call yesterday with the same recommendation. Pt reports starting on new dose yesterday and he will repeat labs on Monday. Result does not reflect change in dose as labs were drawn on 8/29. Advised pt to go to the ED if he has any signs of dysrhythmia. Pt verbalized understanding.

## 2022-04-12 LAB — TACROLIMUS LEVEL: TACROLIMUS BLOOD: 6.1 ng/mL (ref 2.0–20.0)

## 2022-04-12 MED ORDER — BLOOD-GLUCOSE METER
0 refills | 0 days
Start: 2022-04-12 — End: 2023-04-12

## 2022-04-12 NOTE — Unmapped (Signed)
Pt request for RX Refill
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
## 2022-04-14 MED ORDER — BLOOD-GLUCOSE METER
0 refills | 0 days
Start: 2022-04-14 — End: 2023-04-14

## 2022-04-14 MED ORDER — ATORVASTATIN 20 MG TABLET
ORAL_TABLET | Freq: Every day | ORAL | 0 refills | 30 days
Start: 2022-04-14 — End: 2022-05-14

## 2022-04-15 DIAGNOSIS — Z944 Liver transplant status: Principal | ICD-10-CM

## 2022-04-15 DIAGNOSIS — Z796 Long-term use of immunosuppressant medication: Principal | ICD-10-CM

## 2022-04-16 DIAGNOSIS — Z796 Long-term use of immunosuppressant medication: Principal | ICD-10-CM

## 2022-04-16 DIAGNOSIS — Z944 Liver transplant status: Principal | ICD-10-CM

## 2022-04-16 NOTE — Unmapped (Signed)
The patient is requesting a medication refill

## 2022-04-16 NOTE — Unmapped (Signed)
Matthew Key has been contacted in regards to their refill of Zortress . At this time, they have declined refill due to patient having 16 doses remaining. Refill assessment call date has been updated per the patient's request.

## 2022-04-17 LAB — CBC W/ DIFFERENTIAL
BASOPHILS ABSOLUTE COUNT: 0 10*3/uL (ref 0.0–0.2)
BASOPHILS RELATIVE PERCENT: 0 %
EOSINOPHILS ABSOLUTE COUNT: 0.2 10*3/uL (ref 0.0–0.4)
EOSINOPHILS RELATIVE PERCENT: 4 %
HEMATOCRIT: 34.1 % — ABNORMAL LOW (ref 37.5–51.0)
HEMOGLOBIN: 11 g/dL — ABNORMAL LOW (ref 13.0–17.7)
LYMPHOCYTES ABSOLUTE COUNT: 1.5 10*3/uL (ref 0.7–3.1)
LYMPHOCYTES RELATIVE PERCENT: 25 %
MEAN CORPUSCULAR HEMOGLOBIN CONC: 32.3 g/dL (ref 31.5–35.7)
MEAN CORPUSCULAR HEMOGLOBIN: 28.7 pg (ref 26.6–33.0)
MEAN CORPUSCULAR VOLUME: 89 fL (ref 79–97)
MONOCYTES ABSOLUTE COUNT: 0.4 10*3/uL (ref 0.1–0.9)
MONOCYTES RELATIVE PERCENT: 7 %
NEUTROPHILS ABSOLUTE COUNT: 3.9 10*3/uL (ref 1.4–7.0)
NEUTROPHILS RELATIVE PERCENT: 64 %
NUCLEATED RED BLOOD CELLS: 1 % — ABNORMAL HIGH (ref 0–0)
PLATELET COUNT: 156 10*3/uL (ref 150–450)
RED BLOOD CELL COUNT: 3.83 x10E6/uL — ABNORMAL LOW (ref 4.14–5.80)
RED CELL DISTRIBUTION WIDTH: 13.5 % (ref 11.6–15.4)
WHITE BLOOD CELL COUNT: 6.1 10*3/uL (ref 3.4–10.8)

## 2022-04-17 LAB — PHOSPHORUS: PHOSPHORUS, SERUM: 4.8 mg/dL — ABNORMAL HIGH (ref 2.8–4.1)

## 2022-04-17 LAB — GAMMA GT: GAMMA GLUTAMYL TRANSFERASE: 33 IU/L (ref 0–65)

## 2022-04-17 LAB — BILIRUBIN, DIRECT: BILIRUBIN DIRECT: 0.13 mg/dL (ref 0.00–0.40)

## 2022-04-17 LAB — MAGNESIUM: MAGNESIUM: 1.6 mg/dL (ref 1.6–2.3)

## 2022-04-17 MED ORDER — BLOOD-GLUCOSE METER KIT WRAPPER
0 refills | 0 days | Status: CP
Start: 2022-04-17 — End: 2023-04-17
  Filled 2022-04-24: qty 1, 30d supply, fill #0

## 2022-04-17 NOTE — Unmapped (Signed)
Patient repeated labs yesterday at [facility], but no cmp resulted. Contacted lab who reported that most of the CMP had resulted except the CO2, which should be completed tonight, and tac should result tomorrow. They reported K at 4.6. Spoke to patient and let him know his latest K was WNL. Also, verified that he was taking the correct dosage of lokelma, started atovaquone and stopped bactrim. Patient can now resume weekly labs.

## 2022-04-18 DIAGNOSIS — Z944 Liver transplant status: Principal | ICD-10-CM

## 2022-04-18 DIAGNOSIS — L989 Disorder of the skin and subcutaneous tissue, unspecified: Principal | ICD-10-CM

## 2022-04-18 DIAGNOSIS — Z796 Long-term use of immunosuppressant medication: Principal | ICD-10-CM

## 2022-04-18 DIAGNOSIS — T451X5A Adverse effect of antineoplastic and immunosuppressive drugs, initial encounter: Principal | ICD-10-CM

## 2022-04-18 LAB — TACROLIMUS LEVEL: TACROLIMUS BLOOD: 10.5 ng/mL (ref 2.0–20.0)

## 2022-04-18 LAB — COMPREHENSIVE METABOLIC PANEL
A/G RATIO: 2 (ref 1.2–2.2)
ALBUMIN: 4.5 g/dL (ref 3.9–4.9)
ALKALINE PHOSPHATASE: 117 IU/L (ref 44–121)
ALT (SGPT): 16 IU/L (ref 0–44)
AST (SGOT): 16 IU/L (ref 0–40)
BILIRUBIN TOTAL (MG/DL) IN SER/PLAS: 0.3 mg/dL (ref 0.0–1.2)
BLOOD UREA NITROGEN: 33 mg/dL — ABNORMAL HIGH (ref 8–27)
BUN / CREAT RATIO: 19 (ref 10–24)
CALCIUM: 9.6 mg/dL (ref 8.6–10.2)
CHLORIDE: 109 mmol/L — ABNORMAL HIGH (ref 96–106)
CO2: 13 mmol/L — ABNORMAL LOW (ref 20–29)
CREATININE: 1.71 mg/dL — ABNORMAL HIGH (ref 0.76–1.27)
EGFR: 45 mL/min/{1.73_m2} — ABNORMAL LOW
GLOBULIN, TOTAL: 2.2 g/dL (ref 1.5–4.5)
GLUCOSE: 68 mg/dL — ABNORMAL LOW (ref 70–99)
POTASSIUM: 4.6 mmol/L (ref 3.5–5.2)
SODIUM: 142 mmol/L (ref 134–144)
TOTAL PROTEIN: 6.7 g/dL (ref 6.0–8.5)

## 2022-04-18 MED ORDER — TACROLIMUS 1 MG CAPSULE, IMMEDIATE-RELEASE
ORAL_CAPSULE | Freq: Two times a day (BID) | ORAL | 11 refills | 30 days | Status: CP
Start: 2022-04-18 — End: ?

## 2022-04-18 NOTE — Unmapped (Signed)
Patient instructed by pharmacy last week to begin everolimus, reduce tac and stop cellcept. His 9/5 tac level was 10.5. Contacted [facility] for add-on everolimus level, since per PharmD Chargualaf his Everolimus goal is now 3-8 and his Tacrolimus goal will be 6-9 until his Everolimus at goal. Spoke to patient, who confirmed he had been taking the correct dosages of IS and verified that his levels were reliable. Notified PharmD Chargualaf who recommended his tac dose be reduced to 6mg  bid.    Circled back to patient and relayed Tac dose change, instructing him to adjust his med sheet and pillbox now. He verbalized understanding and agreement with plan.

## 2022-04-19 DIAGNOSIS — R739 Hyperglycemia, unspecified: Principal | ICD-10-CM

## 2022-04-19 DIAGNOSIS — E612 Magnesium deficiency: Principal | ICD-10-CM

## 2022-04-19 DIAGNOSIS — Z944 Liver transplant status: Principal | ICD-10-CM

## 2022-04-19 DIAGNOSIS — E789 Disorder of lipoprotein metabolism, unspecified: Principal | ICD-10-CM

## 2022-04-19 DIAGNOSIS — Z5181 Encounter for therapeutic drug level monitoring: Principal | ICD-10-CM

## 2022-04-19 DIAGNOSIS — Z1389 Encounter for screening for other disorder: Principal | ICD-10-CM

## 2022-04-19 DIAGNOSIS — E559 Vitamin D deficiency, unspecified: Principal | ICD-10-CM

## 2022-04-19 NOTE — Unmapped (Addendum)
[facility] Pharmacist has reviewed a new prescription for tacrolimus that indicates a dose decrease.  Patient was counseled on this dosage change by [facility]- see epic note from 04/18/22.  Next refill call date adjusted if necessary.          Clinical Assessment Needed For: Dose Change  Medication: Tacrolimus 1 mg capsules  Last Fill Date/Day Supply: 04/09/22 / 34  Refill Too Soon until 9/24  Was previous dose already scheduled to fill: No    Notes to Pharmacist: Will retest 9/25

## 2022-04-22 DIAGNOSIS — Z944 Liver transplant status: Principal | ICD-10-CM

## 2022-04-22 DIAGNOSIS — Z796 Long-term use of immunosuppressant medication: Principal | ICD-10-CM

## 2022-04-22 LAB — SPECIMEN STATUS REPORT

## 2022-04-23 DIAGNOSIS — Z944 Liver transplant status: Principal | ICD-10-CM

## 2022-04-23 DIAGNOSIS — Z796 Long-term use of immunosuppressant medication: Principal | ICD-10-CM

## 2022-04-23 LAB — CBC W/ DIFFERENTIAL
BASOPHILS ABSOLUTE COUNT: 0.1 10*3/uL (ref 0.0–0.2)
BASOPHILS RELATIVE PERCENT: 1 %
EOSINOPHILS ABSOLUTE COUNT: 0.2 10*3/uL (ref 0.0–0.4)
EOSINOPHILS RELATIVE PERCENT: 4 %
HEMATOCRIT: 33.8 % — ABNORMAL LOW (ref 37.5–51.0)
HEMOGLOBIN: 11 g/dL — ABNORMAL LOW (ref 13.0–17.7)
LYMPHOCYTES ABSOLUTE COUNT: 1.2 10*3/uL (ref 0.7–3.1)
LYMPHOCYTES RELATIVE PERCENT: 22 %
MEAN CORPUSCULAR HEMOGLOBIN CONC: 32.5 g/dL (ref 31.5–35.7)
MEAN CORPUSCULAR HEMOGLOBIN: 29 pg (ref 26.6–33.0)
MEAN CORPUSCULAR VOLUME: 89 fL (ref 79–97)
MONOCYTES ABSOLUTE COUNT: 0.4 10*3/uL (ref 0.1–0.9)
MONOCYTES RELATIVE PERCENT: 8 %
NEUTROPHILS ABSOLUTE COUNT: 3.5 10*3/uL (ref 1.4–7.0)
NEUTROPHILS RELATIVE PERCENT: 60 %
PLATELET COUNT: 142 10*3/uL — ABNORMAL LOW (ref 150–450)
RED BLOOD CELL COUNT: 3.79 x10E6/uL — ABNORMAL LOW (ref 4.14–5.80)
RED CELL DISTRIBUTION WIDTH: 13 % (ref 11.6–15.4)
WHITE BLOOD CELL COUNT: 5.6 10*3/uL (ref 3.4–10.8)

## 2022-04-23 LAB — MAGNESIUM: MAGNESIUM: 1.6 mg/dL (ref 1.6–2.3)

## 2022-04-23 LAB — BILIRUBIN, DIRECT: BILIRUBIN DIRECT: 0.11 mg/dL (ref 0.00–0.40)

## 2022-04-23 LAB — COMPREHENSIVE METABOLIC PANEL
A/G RATIO: 2.3 — ABNORMAL HIGH (ref 1.2–2.2)
ALBUMIN: 4.3 g/dL (ref 3.9–4.9)
ALKALINE PHOSPHATASE: 101 IU/L (ref 44–121)
ALT (SGPT): 10 IU/L (ref 0–44)
AST (SGOT): 15 IU/L (ref 0–40)
BILIRUBIN TOTAL (MG/DL) IN SER/PLAS: 0.3 mg/dL (ref 0.0–1.2)
BLOOD UREA NITROGEN: 25 mg/dL (ref 8–27)
BUN / CREAT RATIO: 17 (ref 10–24)
CALCIUM: 9.7 mg/dL (ref 8.6–10.2)
CHLORIDE: 107 mmol/L — ABNORMAL HIGH (ref 96–106)
CO2: 20 mmol/L (ref 20–29)
CREATININE: 1.46 mg/dL — ABNORMAL HIGH (ref 0.76–1.27)
EGFR: 54 mL/min/{1.73_m2} — ABNORMAL LOW
GLOBULIN, TOTAL: 1.9 g/dL (ref 1.5–4.5)
GLUCOSE: 113 mg/dL — ABNORMAL HIGH (ref 70–99)
POTASSIUM: 5.1 mmol/L (ref 3.5–5.2)
SODIUM: 139 mmol/L (ref 134–144)
TOTAL PROTEIN: 6.2 g/dL (ref 6.0–8.5)

## 2022-04-23 LAB — GAMMA GT: GAMMA GLUTAMYL TRANSFERASE: 27 IU/L (ref 0–65)

## 2022-04-23 LAB — IMMATURE CELLS
BANDS: 3 %
METAMYLOCYTES-LABCORP: 2 % — ABNORMAL HIGH (ref 0–0)

## 2022-04-23 LAB — PHOSPHORUS: PHOSPHORUS, SERUM: 4.1 mg/dL (ref 2.8–4.1)

## 2022-04-23 LAB — EVEROLIMUS: EVEROLIMUS LEVEL: 1.9 ng/mL — ABNORMAL LOW (ref 3.0–8.0)

## 2022-04-23 NOTE — Unmapped (Signed)
Patient's 9/11 labs stable with IS levels pending. Everolimus from  9/5 add-on resulted overnight below goal. Contacted [facility] to assure that 9/11 everolimus level was in process, but it was not ordered, so it was again added on to the sample. Contacted patient and instructed him to specify to draw both tac/everolimus with each lab draw there. He verbalized understanding.

## 2022-04-24 LAB — TACROLIMUS LEVEL: TACROLIMUS BLOOD: 7.1 ng/mL (ref 2.0–20.0)

## 2022-04-24 MED FILL — GABAPENTIN 300 MG CAPSULE: ORAL | 30 days supply | Qty: 90 | Fill #1

## 2022-04-24 MED FILL — ACCU-CHEK GUIDE TEST STRIPS: 25 days supply | Qty: 100 | Fill #1

## 2022-04-25 LAB — COMPREHENSIVE METABOLIC PANEL
A/G RATIO: 2.2 (ref 1.2–2.2)
ALBUMIN: 4.4 g/dL (ref 3.9–4.9)
ALKALINE PHOSPHATASE: 100 IU/L (ref 44–121)
ALT (SGPT): 11 IU/L (ref 0–44)
AST (SGOT): 13 IU/L (ref 0–40)
BILIRUBIN TOTAL (MG/DL) IN SER/PLAS: 0.3 mg/dL (ref 0.0–1.2)
BLOOD UREA NITROGEN: 30 mg/dL — ABNORMAL HIGH (ref 8–27)
BUN / CREAT RATIO: 19 (ref 10–24)
CALCIUM: 9.3 mg/dL (ref 8.6–10.2)
CHLORIDE: 108 mmol/L — ABNORMAL HIGH (ref 96–106)
CO2: 21 mmol/L (ref 20–29)
CREATININE: 1.6 mg/dL — ABNORMAL HIGH (ref 0.76–1.27)
EGFR: 48 mL/min/{1.73_m2} — ABNORMAL LOW
GLOBULIN, TOTAL: 2 g/dL (ref 1.5–4.5)
GLUCOSE: 120 mg/dL — ABNORMAL HIGH (ref 70–99)
POTASSIUM: 5.1 mmol/L (ref 3.5–5.2)
SODIUM: 142 mmol/L (ref 134–144)
TOTAL PROTEIN: 6.4 g/dL (ref 6.0–8.5)

## 2022-04-25 LAB — CBC W/ DIFFERENTIAL
BASOPHILS ABSOLUTE COUNT: 0 10*3/uL (ref 0.0–0.2)
BASOPHILS RELATIVE PERCENT: 0 %
EOSINOPHILS ABSOLUTE COUNT: 0.1 10*3/uL (ref 0.0–0.4)
EOSINOPHILS RELATIVE PERCENT: 1 %
HEMATOCRIT: 35.8 % — ABNORMAL LOW (ref 37.5–51.0)
HEMOGLOBIN: 11.1 g/dL — ABNORMAL LOW (ref 13.0–17.7)
LYMPHOCYTES ABSOLUTE COUNT: 1.7 10*3/uL (ref 0.7–3.1)
LYMPHOCYTES RELATIVE PERCENT: 31 %
MEAN CORPUSCULAR HEMOGLOBIN CONC: 31 g/dL — ABNORMAL LOW (ref 31.5–35.7)
MEAN CORPUSCULAR HEMOGLOBIN: 28.3 pg (ref 26.6–33.0)
MEAN CORPUSCULAR VOLUME: 91 fL (ref 79–97)
MONOCYTES ABSOLUTE COUNT: 0.2 10*3/uL (ref 0.1–0.9)
MONOCYTES RELATIVE PERCENT: 4 %
NEUTROPHILS ABSOLUTE COUNT: 3.4 10*3/uL (ref 1.4–7.0)
NEUTROPHILS RELATIVE PERCENT: 63 %
PLATELET COUNT: 153 10*3/uL (ref 150–450)
RED BLOOD CELL COUNT: 3.92 x10E6/uL — ABNORMAL LOW (ref 4.14–5.80)
RED CELL DISTRIBUTION WIDTH: 12.9 % (ref 11.6–15.4)
WHITE BLOOD CELL COUNT: 5.4 10*3/uL (ref 3.4–10.8)

## 2022-04-25 LAB — IMMATURE CELLS: METAMYLOCYTES-LABCORP: 1 % — ABNORMAL HIGH (ref 0–0)

## 2022-04-25 LAB — BILIRUBIN, DIRECT: BILIRUBIN DIRECT: 0.11 mg/dL (ref 0.00–0.40)

## 2022-04-25 LAB — MAGNESIUM: MAGNESIUM: 1.6 mg/dL (ref 1.6–2.3)

## 2022-04-25 LAB — GAMMA GT: GAMMA GLUTAMYL TRANSFERASE: 26 IU/L (ref 0–65)

## 2022-04-25 LAB — PHOSPHORUS: PHOSPHORUS, SERUM: 3.8 mg/dL (ref 2.8–4.1)

## 2022-04-27 LAB — TACROLIMUS LEVEL: TACROLIMUS BLOOD: 6.6 ng/mL (ref 2.0–20.0)

## 2022-04-28 DIAGNOSIS — Z944 Liver transplant status: Principal | ICD-10-CM

## 2022-04-28 DIAGNOSIS — M791 Myalgia, unspecified site: Principal | ICD-10-CM

## 2022-04-28 MED ORDER — BLOOD-GLUCOSE METER
0 refills | 0 days
Start: 2022-04-28 — End: 2023-04-28

## 2022-04-28 MED ORDER — METHOCARBAMOL 500 MG TABLET
ORAL_TABLET | Freq: Three times a day (TID) | ORAL | 0 refills | 30 days | PRN
Start: 2022-04-28 — End: ?

## 2022-04-28 MED ORDER — ATORVASTATIN 20 MG TABLET
ORAL_TABLET | Freq: Every day | ORAL | 0 refills | 30 days
Start: 2022-04-28 — End: 2022-05-28

## 2022-04-29 DIAGNOSIS — Z944 Liver transplant status: Principal | ICD-10-CM

## 2022-04-29 DIAGNOSIS — Z796 Long-term use of immunosuppressant medication: Principal | ICD-10-CM

## 2022-04-29 MED ORDER — ATORVASTATIN 20 MG TABLET
ORAL_TABLET | Freq: Every day | ORAL | 0 refills | 30 days
Start: 2022-04-29 — End: 2022-05-29

## 2022-04-29 MED ORDER — BLOOD-GLUCOSE METER
0 refills | 0.00000 days
Start: 2022-04-29 — End: 2023-04-29

## 2022-04-29 MED ORDER — METHOCARBAMOL 500 MG TABLET
ORAL_TABLET | Freq: Three times a day (TID) | ORAL | 0 refills | 30 days | Status: CP | PRN
Start: 2022-04-29 — End: ?
  Filled 2022-05-02: qty 90, 30d supply, fill #0

## 2022-04-29 NOTE — Unmapped (Signed)
The patient is requesting a medication refill

## 2022-04-30 LAB — COMPREHENSIVE METABOLIC PANEL
A/G RATIO: 2 (ref 1.2–2.2)
ALBUMIN: 4.3 g/dL (ref 3.9–4.9)
ALKALINE PHOSPHATASE: 97 IU/L (ref 44–121)
ALT (SGPT): 8 IU/L (ref 0–44)
AST (SGOT): 14 IU/L (ref 0–40)
BILIRUBIN TOTAL (MG/DL) IN SER/PLAS: 0.3 mg/dL (ref 0.0–1.2)
BLOOD UREA NITROGEN: 22 mg/dL (ref 8–27)
BUN / CREAT RATIO: 16 (ref 10–24)
CALCIUM: 9.6 mg/dL (ref 8.6–10.2)
CHLORIDE: 106 mmol/L (ref 96–106)
CO2: 22 mmol/L (ref 20–29)
CREATININE: 1.36 mg/dL — ABNORMAL HIGH (ref 0.76–1.27)
EGFR: 59 mL/min/{1.73_m2} — ABNORMAL LOW
GLOBULIN, TOTAL: 2.2 g/dL (ref 1.5–4.5)
GLUCOSE: 147 mg/dL — ABNORMAL HIGH (ref 70–99)
POTASSIUM: 5.2 mmol/L (ref 3.5–5.2)
SODIUM: 141 mmol/L (ref 134–144)
TOTAL PROTEIN: 6.5 g/dL (ref 6.0–8.5)

## 2022-04-30 LAB — PHOSPHORUS: PHOSPHORUS, SERUM: 3.8 mg/dL (ref 2.8–4.1)

## 2022-04-30 LAB — CBC W/ DIFFERENTIAL
BASOPHILS ABSOLUTE COUNT: 0.1 10*3/uL (ref 0.0–0.2)
BASOPHILS RELATIVE PERCENT: 2 %
EOSINOPHILS ABSOLUTE COUNT: 0.2 10*3/uL (ref 0.0–0.4)
EOSINOPHILS RELATIVE PERCENT: 5 %
HEMATOCRIT: 37.4 % — ABNORMAL LOW (ref 37.5–51.0)
HEMOGLOBIN: 11.8 g/dL — ABNORMAL LOW (ref 13.0–17.7)
LYMPHOCYTES ABSOLUTE COUNT: 0.8 10*3/uL (ref 0.7–3.1)
LYMPHOCYTES RELATIVE PERCENT: 18 %
MEAN CORPUSCULAR HEMOGLOBIN CONC: 31.6 g/dL (ref 31.5–35.7)
MEAN CORPUSCULAR HEMOGLOBIN: 27.8 pg (ref 26.6–33.0)
MEAN CORPUSCULAR VOLUME: 88 fL (ref 79–97)
MONOCYTES ABSOLUTE COUNT: 0.2 10*3/uL (ref 0.1–0.9)
MONOCYTES RELATIVE PERCENT: 5 %
NEUTROPHILS ABSOLUTE COUNT: 3.1 10*3/uL (ref 1.4–7.0)
NEUTROPHILS RELATIVE PERCENT: 62 %
PLATELET COUNT: 167 10*3/uL (ref 150–450)
RED BLOOD CELL COUNT: 4.24 x10E6/uL (ref 4.14–5.80)
RED CELL DISTRIBUTION WIDTH: 12.8 % (ref 11.6–15.4)
WHITE BLOOD CELL COUNT: 4.7 10*3/uL (ref 3.4–10.8)

## 2022-04-30 LAB — MAGNESIUM: MAGNESIUM: 1.6 mg/dL (ref 1.6–2.3)

## 2022-04-30 LAB — BILIRUBIN, DIRECT: BILIRUBIN DIRECT: <0.1 mg/dL (ref 0.00–0.40)

## 2022-04-30 LAB — IMMATURE CELLS
BANDS: 4 %
METAMYLOCYTES-LABCORP: 4 % — ABNORMAL HIGH (ref 0–0)

## 2022-04-30 LAB — GAMMA GT: GAMMA GLUTAMYL TRANSFERASE: 29 IU/L (ref 0–65)

## 2022-04-30 LAB — EVEROLIMUS: EVEROLIMUS LEVEL: 2.1 ng/mL — ABNORMAL LOW (ref 3.0–8.0)

## 2022-04-30 NOTE — Unmapped (Signed)
[facility] Refill Coordination Note    Specialty Medication(s) to be Shipped:   Transplant: tacrolimus 1mg , valgancyclovir 450mg , Atovaquone suspension, and Zortress 1mg     Other medication(s) to be shipped:  tresiba     Matthew Key, DOB: 09/30/1960  Phone: [phone] (home)       All above HIPAA information was verified with patient.     Was a Nurse, learning disability used for this call? No    Completed refill call assessment today to schedule patient's medication shipment from the Matthew Key [phone]).  All relevant notes have been reviewed.     Specialty medication(s) and dose(s) confirmed: Regimen is correct and unchanged.   Changes to medications: Matthew Key reports no changes at this time.  Changes to insurance: No  New side effects reported not previously addressed with a pharmacist or physician: None reported  Questions for the pharmacist: No    Confirmed patient received a Conservation officer, historic buildings and a Surveyor, mining with first shipment. The patient will receive a drug information handout for each medication shipped and additional FDA Medication Guides as required.       DISEASE/MEDICATION-SPECIFIC INFORMATION        N/A    SPECIALTY MEDICATION ADHERENCE     Medication Adherence    Patient reported X missed doses in the last month: 0  Specialty Medication: atovaquone 750 mg /5 ml  Patient is on additional specialty medications: Yes  Additional Specialty Medications: Tacrolimus 1 mg   Patient Reported Additional Medication X Missed Doses in the Last Month: 0  Patient is on more than two specialty medications: Yes  Specialty Medication: valganciclovir 450 mg  Patient Reported Additional Medication X Missed Doses in the Last Month: 0  Specialty Medication: zortress 1mg   Patient Reported Additional Medication X Missed Doses in the Last Month: 0                                Were doses missed due to medication being on hold? No    atovaquone 750/5 mg/ml: 5 days of medicine on hand   Tacrolimus  1 mg: 5 days of medicine on hand   valganciclovir 450 mg: 5 days of medicine on hand   zortress 1 mg: 5 days of medicine on hand       REFERRAL TO PHARMACIST     Referral to the pharmacist: Not needed      Matthew Key     Shipping address confirmed in Epic.     Delivery Scheduled: Yes, Expected medication delivery date: 05/03/22.     Medication will be delivered via UPS to the prescription address in Epic WAM.    [facility]   [facility] Specialty Technician

## 2022-04-30 NOTE — Unmapped (Signed)
The patient is requesting a medication refill

## 2022-05-01 DIAGNOSIS — Z944 Liver transplant status: Principal | ICD-10-CM

## 2022-05-01 LAB — EVEROLIMUS: EVEROLIMUS LEVEL: 1.9 ng/mL — ABNORMAL LOW (ref 3.0–8.0)

## 2022-05-01 LAB — TACROLIMUS LEVEL: TACROLIMUS BLOOD: 9 ng/mL (ref 2.0–20.0)

## 2022-05-01 MED ORDER — EVEROLIMUS (IMMUNOSUPPRESSIVE) 1 MG TABLET
ORAL_TABLET | Freq: Two times a day (BID) | ORAL | 11 refills | 0 days | Status: CP
Start: 2022-05-01 — End: ?
  Filled 2022-05-02: qty 120, 30d supply, fill #0

## 2022-05-01 NOTE — Unmapped (Signed)
Per PharmD Chandran's review of 9/18 and 9/11 everolimus levels (9/13 level still pending) patient to increase everolimus from 1mg  BID to 2mg  BID - spoke with patient by phone to really dose increase he verbalized understanding and will update medication list and pill box to reflect dosing increase.

## 2022-05-01 NOTE — Unmapped (Addendum)
[facility] Pharmacist has reviewed a new prescription for Zortress that indicates a dose increase.  Patient was counseled on this dosage change by EL- see epic note from 05/01/22.  Next refill call date adjusted if necessary.        Clinical Assessment Needed For: Dose Change  Medication: Zortress 1 mg tablets  Last Fill Date/Day Supply: 04/01/22 / 30  Prior Authorization Required  Was previous dose already scheduled to fill: Yes    Notes to Pharmacist: Scheduled for tomorrow

## 2022-05-02 MED FILL — DOCUSATE SODIUM 100 MG CAPSULE: ORAL | 30 days supply | Qty: 60 | Fill #1

## 2022-05-02 MED FILL — VALGANCICLOVIR 450 MG TABLET: ORAL | 30 days supply | Qty: 30 | Fill #1

## 2022-05-02 MED FILL — ATOVAQUONE 750 MG/5 ML ORAL SUSPENSION: ORAL | 30 days supply | Qty: 300 | Fill #1

## 2022-05-02 MED FILL — AMLODIPINE 5 MG TABLET: ORAL | 30 days supply | Qty: 30 | Fill #0

## 2022-05-03 LAB — COMPREHENSIVE METABOLIC PANEL
A/G RATIO: 2.1 (ref 1.2–2.2)
ALBUMIN: 4.5 g/dL (ref 3.9–4.9)
ALKALINE PHOSPHATASE: 104 IU/L (ref 44–121)
ALT (SGPT): 15 IU/L (ref 0–44)
AST (SGOT): 17 IU/L (ref 0–40)
BILIRUBIN TOTAL (MG/DL) IN SER/PLAS: 0.3 mg/dL (ref 0.0–1.2)
BLOOD UREA NITROGEN: 26 mg/dL (ref 8–27)
BUN / CREAT RATIO: 17 (ref 10–24)
CALCIUM: 9.8 mg/dL (ref 8.6–10.2)
CHLORIDE: 102 mmol/L (ref 96–106)
CO2: 22 mmol/L (ref 20–29)
CREATININE: 1.49 mg/dL — ABNORMAL HIGH (ref 0.76–1.27)
EGFR: 53 mL/min/{1.73_m2} — ABNORMAL LOW
GLOBULIN, TOTAL: 2.1 g/dL (ref 1.5–4.5)
GLUCOSE: 106 mg/dL — ABNORMAL HIGH (ref 70–99)
POTASSIUM: 5.4 mmol/L — ABNORMAL HIGH (ref 3.5–5.2)
SODIUM: 137 mmol/L (ref 134–144)
TOTAL PROTEIN: 6.6 g/dL (ref 6.0–8.5)

## 2022-05-03 LAB — IMMATURE CELLS
BANDS: 2 %
METAMYLOCYTES-LABCORP: 2 % — ABNORMAL HIGH (ref 0–0)
MYELOCYTES: 3 % — ABNORMAL HIGH (ref 0–0)

## 2022-05-03 LAB — CBC W/ DIFFERENTIAL
BASOPHILS ABSOLUTE COUNT: 0.2 10*3/uL (ref 0.0–0.2)
BASOPHILS RELATIVE PERCENT: 4 %
EOSINOPHILS ABSOLUTE COUNT: 0.2 10*3/uL (ref 0.0–0.4)
EOSINOPHILS RELATIVE PERCENT: 4 %
HEMATOCRIT: 37.6 % (ref 37.5–51.0)
HEMOGLOBIN: 11.7 g/dL — ABNORMAL LOW (ref 13.0–17.7)
LYMPHOCYTES ABSOLUTE COUNT: 1.1 10*3/uL (ref 0.7–3.1)
LYMPHOCYTES RELATIVE PERCENT: 20 %
MEAN CORPUSCULAR HEMOGLOBIN CONC: 31.1 g/dL — ABNORMAL LOW (ref 31.5–35.7)
MEAN CORPUSCULAR HEMOGLOBIN: 27.4 pg (ref 26.6–33.0)
MEAN CORPUSCULAR VOLUME: 88 fL (ref 79–97)
MONOCYTES ABSOLUTE COUNT: 0.2 10*3/uL (ref 0.1–0.9)
MONOCYTES RELATIVE PERCENT: 3 %
NEUTROPHILS ABSOLUTE COUNT: 3.5 10*3/uL (ref 1.4–7.0)
NEUTROPHILS RELATIVE PERCENT: 62 %
PLATELET COUNT: 178 10*3/uL (ref 150–450)
RED BLOOD CELL COUNT: 4.27 x10E6/uL (ref 4.14–5.80)
RED CELL DISTRIBUTION WIDTH: 13 % (ref 11.6–15.4)
WHITE BLOOD CELL COUNT: 5.5 10*3/uL (ref 3.4–10.8)

## 2022-05-03 LAB — MAGNESIUM: MAGNESIUM: 1.8 mg/dL (ref 1.6–2.3)

## 2022-05-03 LAB — BILIRUBIN, DIRECT: BILIRUBIN DIRECT: 0.11 mg/dL (ref 0.00–0.40)

## 2022-05-03 LAB — GAMMA GT: GAMMA GLUTAMYL TRANSFERASE: 31 IU/L (ref 0–65)

## 2022-05-03 LAB — PHOSPHORUS: PHOSPHORUS, SERUM: 4.4 mg/dL — ABNORMAL HIGH (ref 2.8–4.1)

## 2022-05-04 LAB — TACROLIMUS LEVEL: TACROLIMUS BLOOD: 9.1 ng/mL (ref 2.0–20.0)

## 2022-05-05 LAB — EVEROLIMUS: EVEROLIMUS LEVEL: 2.2 ng/mL — ABNORMAL LOW (ref 3.0–8.0)

## 2022-05-06 ENCOUNTER — Institutional Professional Consult (permissible substitution): Admit: 2022-05-06 | Discharge: 2022-05-06 | Payer: MEDICARE

## 2022-05-06 ENCOUNTER — Ambulatory Visit: Admit: 2022-05-06 | Discharge: 2022-05-06 | Payer: MEDICARE

## 2022-05-06 DIAGNOSIS — C61 Malignant neoplasm of prostate: Secondary | ICD-10-CM

## 2022-05-06 DIAGNOSIS — Z23 Encounter for immunization: Principal | ICD-10-CM

## 2022-05-06 DIAGNOSIS — Z796 Long-term use of immunosuppressant medication: Principal | ICD-10-CM

## 2022-05-06 DIAGNOSIS — D849 Immunodeficiency, unspecified: Principal | ICD-10-CM

## 2022-05-06 DIAGNOSIS — Z944 Liver transplant status: Principal | ICD-10-CM

## 2022-05-06 HISTORY — DX: Malignant neoplasm of prostate: C61

## 2022-05-06 LAB — COMPREHENSIVE METABOLIC PANEL
ALBUMIN: 4.2 g/dL (ref 3.4–5.0)
ALKALINE PHOSPHATASE: 98 U/L (ref 46–116)
ALT (SGPT): 17 U/L (ref 10–49)
ANION GAP: 9 mmol/L (ref 5–14)
AST (SGOT): 18 U/L (ref <=34)
BILIRUBIN TOTAL: 0.3 mg/dL (ref 0.3–1.2)
BLOOD UREA NITROGEN: 26 mg/dL — ABNORMAL HIGH (ref 9–23)
BUN / CREAT RATIO: 16
CALCIUM: 9.1 mg/dL (ref 8.7–10.4)
CHLORIDE: 107 mmol/L (ref 98–107)
CO2: 24 mmol/L (ref 20.0–31.0)
CREATININE: 1.59 mg/dL — ABNORMAL HIGH
EGFR CKD-EPI (2021) MALE: 49 mL/min/{1.73_m2} — ABNORMAL LOW (ref >=60)
GLUCOSE RANDOM: 140 mg/dL — ABNORMAL HIGH (ref 70–99)
POTASSIUM: 5.8 mmol/L — ABNORMAL HIGH (ref 3.4–4.8)
PROTEIN TOTAL: 6.9 g/dL (ref 5.7–8.2)
SODIUM: 140 mmol/L (ref 135–145)

## 2022-05-06 LAB — CBC W/ AUTO DIFF
BASOPHILS ABSOLUTE COUNT: 0 10*9/L (ref 0.0–0.1)
BASOPHILS RELATIVE PERCENT: 0.8 %
EOSINOPHILS ABSOLUTE COUNT: 0.1 10*9/L (ref 0.0–0.5)
EOSINOPHILS RELATIVE PERCENT: 2.3 %
HEMATOCRIT: 35.7 % — ABNORMAL LOW (ref 39.0–48.0)
HEMOGLOBIN: 11.9 g/dL — ABNORMAL LOW (ref 12.9–16.5)
LYMPHOCYTES ABSOLUTE COUNT: 1.1 10*9/L (ref 1.1–3.6)
LYMPHOCYTES RELATIVE PERCENT: 19.4 %
MEAN CORPUSCULAR HEMOGLOBIN CONC: 33.2 g/dL (ref 32.0–36.0)
MEAN CORPUSCULAR HEMOGLOBIN: 28.5 pg (ref 25.9–32.4)
MEAN CORPUSCULAR VOLUME: 85.7 fL (ref 77.6–95.7)
MEAN PLATELET VOLUME: 9.7 fL (ref 6.8–10.7)
MONOCYTES ABSOLUTE COUNT: 0.3 10*9/L (ref 0.3–0.8)
MONOCYTES RELATIVE PERCENT: 4.3 %
NEUTROPHILS ABSOLUTE COUNT: 4.3 10*9/L (ref 1.8–7.8)
NEUTROPHILS RELATIVE PERCENT: 73.2 %
PLATELET COUNT: 175 10*9/L (ref 150–450)
RED BLOOD CELL COUNT: 4.17 10*12/L — ABNORMAL LOW (ref 4.26–5.60)
RED CELL DISTRIBUTION WIDTH: 13.4 % (ref 12.2–15.2)
WBC ADJUSTED: 5.9 10*9/L (ref 3.6–11.2)

## 2022-05-06 LAB — LIPID PANEL
CHOLESTEROL/HDL RATIO SCREEN: 3 (ref 1.0–4.5)
CHOLESTEROL: 110 mg/dL (ref <=200)
HDL CHOLESTEROL: 37 mg/dL — ABNORMAL LOW (ref 40–60)
LDL CHOLESTEROL CALCULATED: 55 mg/dL (ref 40–99)
NON-HDL CHOLESTEROL: 73 mg/dL (ref 70–130)
TRIGLYCERIDES: 88 mg/dL (ref 0–150)
VLDL CHOLESTEROL CAL: 17.6 mg/dL (ref 12–42)

## 2022-05-06 LAB — TACROLIMUS LEVEL, TROUGH: TACROLIMUS, TROUGH: 6.3 ng/mL (ref 5.0–15.0)

## 2022-05-06 LAB — HEMOGLOBIN A1C
ESTIMATED AVERAGE GLUCOSE: 140 mg/dL
HEMOGLOBIN A1C: 6.5 % — ABNORMAL HIGH (ref 4.8–5.6)

## 2022-05-06 LAB — GAMMA GT: GAMMA GLUTAMYL TRANSFERASE: 32 U/L

## 2022-05-06 LAB — EVEROLIMUS: EVEROLIMUS LEVEL: 2.5 ng/mL — ABNORMAL LOW (ref 3.0–15.0)

## 2022-05-06 LAB — MAGNESIUM: MAGNESIUM: 1.7 mg/dL (ref 1.6–2.6)

## 2022-05-06 LAB — PHOSPHORUS: PHOSPHORUS: 4.1 mg/dL (ref 2.4–5.1)

## 2022-05-06 LAB — BILIRUBIN, DIRECT: BILIRUBIN DIRECT: 0.2 mg/dL (ref 0.00–0.30)

## 2022-05-06 MED ORDER — EVEROLIMUS (IMMUNOSUPPRESSIVE) 1 MG TABLET
ORAL_TABLET | Freq: Two times a day (BID) | ORAL | 11 refills | 45.00000 days | Status: CP
Start: 2022-05-06 — End: 2022-05-06
  Filled 2022-06-11: qty 120, 30d supply, fill #0

## 2022-05-06 MED ORDER — ATORVASTATIN 40 MG TABLET
ORAL_TABLET | Freq: Every day | ORAL | 3 refills | 90.00000 days | Status: CP
Start: 2022-05-06 — End: 2023-05-06

## 2022-05-06 MED ORDER — OZEMPIC 0.25 MG OR 0.5 MG (2 MG/3 ML) SUBCUTANEOUS PEN INJECTOR
SUBCUTANEOUS | 0 refills | 56 days
Start: 2022-05-06 — End: 2022-07-01

## 2022-05-06 MED ORDER — AMLODIPINE 10 MG TABLET
ORAL_TABLET | Freq: Every day | ORAL | 11 refills | 30.00000 days | Status: CP
Start: 2022-05-06 — End: 2023-05-06

## 2022-05-06 MED ORDER — POLYETHYLENE GLYCOL 3350 17 GRAM/DOSE ORAL POWDER
Freq: Every day | ORAL | 3 refills | 15 days | PRN
Start: 2022-05-06 — End: ?

## 2022-05-06 MED ORDER — INSULIN DEGLUDEC (U-100) 100 UNIT/ML (3 ML) SUBCUTANEOUS PEN
Freq: Every day | SUBCUTANEOUS | 3 refills | 115.00000 days | Status: CN
Start: 2022-05-06 — End: 2023-05-06

## 2022-05-06 MED ORDER — TACROLIMUS 1 MG CAPSULE, IMMEDIATE-RELEASE
ORAL_CAPSULE | ORAL | 11 refills | 30 days | Status: CP
Start: 2022-05-06 — End: ?
  Filled 2022-05-06: qty 360, 30d supply, fill #0

## 2022-05-06 NOTE — Unmapped (Signed)
[facility] CLINIC PHARMACY NOTE  Matthew Key  [id number]    Medication changes today:   Increase Everolimus to 3 mg BID  Decrease tacrolimus to 6 mg in AM and 5 mg in PM  Increase amlodipine to 10 mg daily  Start Ozempic 0.25 mg subQ once weekly x 4 weeks, then increase to 0.5 mg once weekly.  Decrease Tresiba to 13 units daily  Increase atorvastatin to 40 mg daily  Take extra dose of Veltassa 8.4 g packet x 3 days, then resume typical dose of Veltrassa 16.8 g daily.   Start taking Miralax PRN for constipation   Flu vaccine given in clinic today. Recommended COVID-19 and RSV vaccines.     Education/Adherence tools provided today:  - Provided updated medication list  - Provided additional education on immunosuppression and transplant related medications including reviewing indications of medications, dosing and side effects    Follow up items:  Goal of understanding indications and dosing of immunosuppression medications  Everolimus and tac troughs  Electrolytes - K+   BP s/p amlodipine increase; consider ACE/ARB for UACR if renal function sufficient  Lipid panel at 6 mo visit  Ozempic tolerability and dose titration, A1c in 3 months  Consider increase in Cymbalta dose to 60 mg daily for improved pain control; consider stopping gabapentin if not helping  Constipation and hard stools  Encourage pt to schedule f/u with endocrinology    Next visit with pharmacy in 1-3 months  ____________________________________________________________________    Matthew Key is a 61 y.o. 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     Immunologic Risk: first transplant    Induction Agent : basiliximab    Donor Factors: DBD    Other PMH significant for Hypertension and Stroke, T2DM, OSA on CPAP    Post op course uncomplicated  8/8-8/9 hospitalized due to elevated liver enzymes; He was evaluated with a liver doppler ultrasound which showed patent veins and arteries. Labs were obtained and showed improvement in LFTs.  8/10: EMG with no findings of neuropathy  8/17: Instructed to increase Tresiba from 15 to 17 units daily; start K-lowering supplement.  8/25: increase Mg to 2 tabs BID, continue daily veltassa    Rejection History: NTD  Infection History: NTD  ___________________________________________________________________    Last seen by pharmacy ~ 1 months ago    Interval History:   8/28: Sent to ED for K+ 6.7 - transitioned from bactrim to atovaquone   9/7: stop MMF, start everolimus, tac decreased to 6 mg BID   9/20: everolimus increased from 1mg  BID to 2mg  BID for levels 1.9-2.2 during last 3 labs     Seen by pharmacy today for: medication management and blood glucose management and education    CC:  Patient has no complaints today    General:  Back and hand/arm pain  Neuro: No issues   CV: No issues   Resp: No issues   GI: Constipation   GU: Frequent urination at night  Derm: No issues  Psych: No issues.    Fluid Status:   Edema no  SOB no  Intake: 6+ bottles of water per day  Output: not assessed  Plan: continue to monitor      There were no vitals filed for this visit.  BP 156/80   ___________________________________________________________________    Allergies   Allergen Reactions    Venom-Honey Bee Swelling       Medications reviewed in EPIC medication  station and updated today by the clinical pharmacist practitioner.    Current Outpatient Medications   Medication Instructions    acetaminophen (TYLENOL) 325-650 mg, Oral, Every 6 hours PRN    amLODIPine (NORVASC) 10 mg, Oral, Daily (standard)    aspirin (ECOTRIN) 81 mg, Oral, Daily (standard)    atorvastatin (LIPITOR) 40 mg, Oral, Daily (standard)    atovaquone (MEPRON) 1,500 mg, Oral, Daily (standard)    blood sugar diagnostic (ACCU-CHEK GUIDE TEST STRIPS) Strp Use to check blood sugar as directed with insulin 3 times a day & for symptoms of high or low blood sugar    blood-glucose meter kit Use as instructed blood-glucose meter Misc Use to test blood sugar as directed    docusate sodium (COLACE) 100 mg, Oral, 2 times a day PRN    DULoxetine (CYMBALTA) 30 mg, Oral, Daily (standard)    everolimus (immunosuppressive) 3 mg, Oral, 2 times a day (standard)    gabapentin (NEURONTIN) 300 mg, Oral, 3 times a day (standard)    glucagon spray 3 mg/actuation Spry Use 1 spray into the left nostril every fifteen (15) minutes as needed (low blood sugar).    hydrOXYzine (VISTARIL) 50 mg, Oral, Nightly PRN    insulin ASPART (NOVOLOG FLEXPEN) 100 unit/mL (3 mL) injection pen Inject under the skin per sliding scale prior to meals. If premeal BG 151-200 take 2 additional units for correction, if 201-250 take 4 additional units, etc. Max dose 36 units per day.  Store in-use prefilled pens at room temperature <86??F and use within 28 days; do not refrigerate.    insulin degludec (TRESIBA FLEXTOUCH U-100) 13 Units, Subcutaneous, Daily (standard), Adjust as instructed.    lancets Misc Use to check blood sugar as directed with insulin 3 times a day & for symptoms of high or low blood sugar.    magnesium oxide-Mg AA chelate (MAGNESIUM, AMINO ACID CHELATE,) 133 mg 2 tablets, Oral, 2 times a day    melatonin 3 mg, Oral, Every evening    methocarbamoL (ROBAXIN) 500 mg, Oral, 3 times a day PRN    pantoprazole (PROTONIX) 40 mg, Oral, Daily PRN    pen needle, diabetic (BD ULTRA-FINE NANO PEN NEEDLE) 32 gauge x 5/32 (4 mm) Ndle Use as directed  1-2 times per day    tacrolimus (PROGRAF) 1 MG capsule Take 6 capsules (6 mg total) by mouth daily AND 5 capsules (5 mg total) nightly.    VELTASSA 16.8 g, Oral, Daily (standard)       GRAFT FUNCTION: stable    Lab Results   Component Value Date    AST 18 05/06/2022    AST 17 05/02/2022    ALT 17 05/06/2022    ALT 15 05/02/2022    Total Bilirubin 0.3 05/06/2022    Total Bilirubin 0.3 05/02/2022      Zero hour biopsy: mild reperfusion injury on donor graft. No malignancy on explanted liver.  Biopsies to date: NTD    Renal Function: stable but above pre txp baseline of 0.9    Lab Results   Component Value Date    Creatinine 1.59 (H) 05/06/2022    Creatinine 1.49 (H) 05/02/2022    Creatinine 1.36 (H) 04/29/2022    Creatinine 1.60 (H) 04/24/2022    Creatinine 1.46 (H) 04/22/2022       Proteinuria/UPC: No/ none.  Lab Results   Component Value Date    Protein/Creatinine Ratio, Urine 0.156 04/08/2022       CURRENT IMMUNOSUPPRESSION:    Tacrolimus (  Prograf) 6 mg BID  Tacrolimus Goal: 3 - 5  Everolimus 2 mg BID  Sirolimus/Everolimus goal: 3-8  Switched from MMF to allow for decreased tac goal and preserve renal function    IMMUNOSUPPRESSION DRUG LEVELS:  Lab Results   Component Value Date    Tacrolimus, Trough 6.3 05/06/2022    Tacrolimus, Trough 11.4 03/20/2022    Tacrolimus, Trough 11.7 03/13/2022    Tacrolimus Lvl 9.1 05/02/2022    Tacrolimus Lvl 9.0 04/29/2022    Tacrolimus Lvl 6.6 04/24/2022     Everolimus and Prograf level is accurate 12 hour trough     WBC/ANC:  wnl  Lab Results   Component Value Date    WBC 5.9 05/06/2022    WBC 5.5 05/02/2022       Plan:  Increase Everolimus to 3 mg BID (goal 3-8 ng/ml)  Decrease tacrolimus to 6 mg in AM and 5 mg in PM     OI Prophylaxis:   CMV Status: D+/ R+, moderate risk .   CMV prophylaxis: valganciclovir 450 mg daily x 3 months per protocol (end 05/09/22)  No results found for: CMVCP  Estimated Creatinine Clearance: 51.8 mL/min (A) (based on SCr of 1.59 mg/dL (H)).    PCP Prophylaxis: atovaquone 1500 mg daily x 6 months (end 08/08/22); switched from Bactrim due to hyperkalemia    Thrush: completed in hospital    Patient is  tolerating infectious prophylaxis well    Plan: Continue per protocol. Continue to monitor.      CVA history: asa 81 mg   The 10-year ASCVD risk score (Arnett DK, et al., 2019) is: 22.4%  No results found for: LIPID    Statin therapy: Indicated; currently on atorvastatin 20 mg daily  Plan: Increase atorvastatin to 40 mg daily. Continue to monitor       BP: Goal < 140/90. Clinic vitals 156/80  Home BP ranges: no log today in clinic, but reports average of 130/54 (unclear accuracy)  Current meds include: amlodipine 5 mg daily  Plan: out of goal. Increase amlodipine to 10 mg daily. Continue to monitor    Anemia of CKD:  H/H:   Lab Results   Component Value Date    HGB 11.9 (L) 05/06/2022     Lab Results   Component Value Date    HCT 35.7 (L) 05/06/2022     Iron panel:  Lab Results   Component Value Date    IRON 85 03/11/2022    TIBC 279 03/11/2022    FERRITIN 250.9 03/11/2022     Lab Results   Component Value Date    Iron Saturation (%) 30 03/11/2022     Prior ESA use: none post transplnat  Plan: stable. No change.  Continue to monitor.     DM:   Lab Results   Component Value Date    A1C 6.5 (H) 05/06/2022   . Goal A1c < 7  A1c increased from 5.2% three months prior (02/05/22)  History of Dm? Yes: T2DM  Established with endocrinologist/PCP for BG managment? Yes: Dr. Melrose Nakayama with [facility]  Currently on: Tresiba 15 units daily, Novolog SSI 2: 50 > 150 mg/dl  Reported FBG average 16-109, with only one value >200 recently  Patient denies any episodes of hypoglycemia since last clinic visit  Previous meds: was on Ozempic 1 mg weekly until stopped 6/19 for hypotension (pre-transplant)    Diet: small portions for breakfast/lunch and normal portion for dinner.  Also drinking Ensure (not assessed today)  Exercise:not yet  Hypoglycemia: no  Plan: Restart Ozempic 0.25 mg once weekly x 4 weeks, then increase to 0.5 mg once weekly. Decrease Tresiba to 13 units daily. Based on recent UACR of 36 at PCP visit, would consider addition of ACE/ARB at next visit if renal function sufficient. Continue to monitor.       Electrolytes:  K is high .  Lab Results   Component Value Date    Potassium 5.8 (H) 05/06/2022    Potassium 5.4 (H) 05/02/2022    Potassium, Bld 4.2 02/06/2022    Sodium 140 05/06/2022    Sodium 137 05/02/2022    Sodium Whole Blood 141 02/06/2022    Magnesium 1.7 05/06/2022    Magnesium 1.8 05/02/2022    CO2 24.0 05/06/2022    CO2 22 05/02/2022     Meds currently on: Mg plus protein 2 tabs BID, Veltassa 16.8 g daily   Plan: Take an extra Veltassa 8.4 g packet x 3 days. Then, continue current Mg and Veltassa without changes. Continue to monitor    GI/BM: pt reports constipation  Meds currently on: docusate PRN (using BID), Miralax PRN (not using), pantoprazole 40 mg daily PRN  Plan: Instructed patient to start using Miralax PRN for constipation. Continue to monitor    Pain: pt reports moderate pain despite use of below regimen    Meds currently on: APAP PRN, gabapentin 300 mg TID, methocarbamol 500 mg TID prn (using TID most days), Cymbalta 30 mg daily  Plan: Patient opts to wait until upcoming CT scan to make changes, but would opt to increase Cymbalta to 60 mg daily if patient requires additional pain control. Consider stopping gabapentin at next visit if pt still has concerns about whether it is providing benefit. Continue to monitor    Urinary retention/ urgency: started tamsulosin post op after failed trial of void; patient now experiencing increasing frequency at night  Meds currently on: none (tamsulosin discontinued at previous clinic visit)  Plan: Continue to monitor    Bone health:   Vitamin D Level: none available. Goal > 30.   Lab Results   Component Value Date    Vitamin D Total (25OH) 27.9 09/11/2020       Lab Results   Component Value Date    Calcium 9.1 05/06/2022    Calcium 9.8 05/02/2022    Calcium 9.6 04/29/2022       Last DEXA results:  01/2020: osteopenia (spine T score -1.1, femoral neck -1.7)  Current meds include: none  Plan: Vitamin D level  pending. Consider adding Vitamin D and calcium given osteopenia if D is low. Continue to monitor.     Anxiety:  Meds currently on: hydroxyzine 50 HS PRN (rarely needs)    Women's/Men's Health:  Matthew Key is a 61 y.o. male. Patient reports no men's/women's health issues  Plan: Continue to monitor    Immunizations:  Influenza [Annual]: Received 06/2021, 05/06/22    PCV13: Received 11/2017  PPSV23: Received 03/2020  PCV20: Received 08/2021    Shingrix Zoster [2 doses, 2 - 6 months apart]: Received 05/2020, 07/2020    COVID-19 [3 primary doses, 2 boosters]: 1st dose given 10/2019, 2nd dose given 10/2019, 3rd dose given 05/2020, and Booster given 08/2021 (bivalent)    Immunization status: Recommended COVID-19 and RSV vaccines.     Pharmacy preference:  [facility]  Medication Refills:  Everolimus, tacrolimus, amlodipine, Tresiba, atorvastatin  Medication Access:  N/a    Adherence: Patient has poor understanding  of medications; was not able to independently identify names/doses of immunosuppressants and OI meds.  Patient  does not fill their own pill box on a regular basis at home. States he has stopped using his pillbox and takes meds from bottle utilizing MyChart and MedAction Plan to know which medications to take.   Patient brought medication card:no  Pill box:did not bring   Plan: Asked that he bring filled box for pharmacist review to next visit; provided extensive adherence counseling/intervention    Patient was reviewed with Dr.Desai who was agreement with the stated plan:     During this visit, the following was completed:   BG log data assessment  BP log data assessment  Labs ordered and evaluated  complex treatment plan >1 DS   I spent a total of 40 minutes face to face with the patient delivering clinical care and providing education/counseling.    All questions/concerns were addressed to the patient's satisfaction.  __________________________________________  Despina Pole, PharmD  PGY2 Pharmacy Resident, Ambulatory Care    Cecilie Lowers, PharmD, CPP  Solid Organ Transplant Clinical Pharmacist Practitioner

## 2022-05-06 NOTE — Unmapped (Signed)
Therapy Update Follow Up: No issues - Copay = $0

## 2022-05-06 NOTE — Unmapped (Signed)
FOLLOW UP CLINIC NOTE       Date of Service: 05/06/2022    Current complaint: 3 mo follow up for liver transplant      Assessment and Plan:   Matthew Key is a 61 y.o. 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 K needs to be treated and repeat labs on Wednesday afternoon.    - Plan for follow up in 3 months    History of Present Illness:   Matthew Key is a 60 y.o. male who underwent OLT on 02/06/22. He is taking Tac 6 mg BID and Everolimus 2 mg BID for immunosupression. He last took his meds at 11 pm last night which will complicated interpreting his trough levels. He presented to the ED on 7/19 for R arm paresthesia, 8/8 for asymptomatic elevated LFTs, and on 8/28 for asymptomatic hyperkalemia; he did not require admission any of those times. Both problems resolved quickly in the ED with appropriate treatment. PVL bilateral upper extremity duplex negative for DVTs on 7/19.     Today, he denies constipation. He drinks 6- 16 oz water bottles daily. Denies swelling in the lower extremities, denies night sweats. He denies consuming any alcohol since his transplant. He walks several miles daily when at home.    Morning blood sugars have been 90-120, once over 200; A1c is 6.5 today up from 5.2 that was 3 months ago. On Tresiba and Novolog. Urine protein/cr is 35 per patient. Bili, Phos, Mag, Gamma GT, and lipid panels are stable and WNL.    Physical Exam:  BP 156/80 (BP Site: R Arm, BP Position: Sitting, BP Cuff Size: Large)  - Pulse 66  - Temp 36 ??C (96.8 ??F) (Tympanic)  - Ht 172.7 cm (5' 8)  - Wt 87.5 kg (193 lb)  - BMI 29.35 kg/m??   General Appearance:  No acute distress, well appearing and well nourished. No pitting edema in LE.   Head:  Normocephalic, atraumatic.   Eyes:  No scleral icterus.   Pulmonary:    Normal respiratory effort.   Cardiovascular:  Regular rate and rhythm.   Abdomen:   Abdominal scar well healed without hernia, with intact sensation that is improving from prior exams.   Neurologic: Non-focal exam.         Lab Results   Component Value Date    WBC 5.5 05/02/2022    HGB 11.7 (L) 05/02/2022    HCT 37.6 05/02/2022    PLT 178 05/02/2022     Lab Results   Component Value Date    NA 137 05/02/2022    K 5.4 (H) 05/02/2022    CL 102 05/02/2022    CO2 22 05/02/2022    BUN 26 05/02/2022    CREATININE 1.49 (H) 05/02/2022    CALCIUM 9.8 05/02/2022    MG 1.8 05/02/2022    PHOS 4.5 04/08/2022      Lab Results   Component Value Date    ALKPHOS 104 05/02/2022    BILITOT 0.3 05/02/2022    BILIDIR 0.11 05/02/2022    PROT 6.6 05/02/2022    ALBUMIN 4.1 04/08/2022    ALT 15 05/02/2022    AST 17 05/02/2022    GGT 31 05/02/2022      Lab Results   Component Value Date    INR 1.03 03/20/2022    APTT 30.6 02/27/2022          IMAGING:  No results found.    ~~~~  Madaline Savage, MS3       I attest that I have reviewed the student note and that the components of the history of the present performed in my presence by the student where I verified the documentation and performed (or re-performed) the exam and medical decision making. Diondra Pines S. Celine Mans, MD

## 2022-05-06 NOTE — Unmapped (Addendum)
[facility] Pharmacist has reviewed a new prescription for tacrolimus that indicates a dose decrease.  Patient was counseled on this dosage change by coordinator [facility]- see epic note from 9/25.  Next refill call date adjusted if necessary.    [facility] Pharmacist has reviewed a new prescription for zortress that indicates a dose increase.  Patient was counseled on this dosage change by coordinator [facility]- see epic note from 9/25.  Next refill call date adjusted if necessary.          Clinical Assessment Needed For: Dose Change  Medication: Tacrolimus 1mg  capsule  Last Fill Date/Day Supply: 04/01/2022 / 88 days  Refill Too Soon until 05/29/2022  Was previous dose already scheduled to fill: No    Notes to Pharmacist: Will re-test on 10/18      Clinical Assessment Needed For: Dose Change  Medication: Zortress 1mg  tablet  Last Fill Date/Day Supply: 05/03/2022 / 15 days  Refill Too Soon until 05/17/2022  Was previous dose already scheduled to fill: No    Notes to Pharmacist: Will re-test on 10/06

## 2022-05-06 NOTE — Unmapped (Unsigned)
Per provider, the patient received flu vaccine.  Patient ID verified with name and date of birth.  All screening questions were answered.  Vaccine(s) were administered as ordered.  See immunization history for documentation.  Patient tolerated the injection(s) well with no issues noted.  Vaccine Information sheet given to the patient.

## 2022-05-06 NOTE — Unmapped (Signed)
Patient's Everolimus level from today, still subpar. Per PharmD Chargualaf, Everolimus dose to be increased to 3mg  bid. She set new tac goal at 3-5, recommending dose adjustment to 6mg /5mg  daily. Contacted patient and relayed recommendations. He verbalized understanding and readback changes with accuracy.

## 2022-05-07 ENCOUNTER — Telehealth: Admit: 2022-05-07 | Discharge: 2022-05-08 | Payer: MEDICARE

## 2022-05-07 MED ORDER — OZEMPIC 0.25 MG OR 0.5 MG (2 MG/3 ML) SUBCUTANEOUS PEN INJECTOR
SUBCUTANEOUS | 0 refills | 56 days | Status: CP
Start: 2022-05-07 — End: 2022-07-01
  Filled 2022-05-08: qty 6, 56d supply, fill #0

## 2022-05-07 MED ORDER — ATORVASTATIN 40 MG TABLET
ORAL_TABLET | Freq: Every day | ORAL | 11 refills | 30 days | Status: CP
Start: 2022-05-07 — End: ?
  Filled 2022-05-29: qty 30, 30d supply, fill #0

## 2022-05-07 MED ORDER — AMLODIPINE 10 MG TABLET
ORAL_TABLET | Freq: Every day | ORAL | 11 refills | 30 days | Status: CP
Start: 2022-05-07 — End: 2023-05-07
  Filled 2022-05-14: qty 30, 30d supply, fill #0

## 2022-05-07 MED ORDER — POLYETHYLENE GLYCOL 3350 17 GRAM/DOSE ORAL POWDER
Freq: Every day | ORAL | 3 refills | 15 days | Status: CP | PRN
Start: 2022-05-07 — End: ?
  Filled 2022-05-29: qty 238, 14d supply, fill #0

## 2022-05-07 NOTE — Unmapped (Signed)
[facility] Outpatient Nutrition Services   Medical Nutrition Therapy Consultation       Visit Type:    Return Assessment    Referral Reason: :  Liver Transplant 02/06/22      Matthew Key is a 61 y.o. male seen for medical nutrition therapy for liver transplant follow up. His active problem list, medication list and allergies were reviewed.     His interim medical history is significant for elevated creatinine, continued hyperkalemia.    Anthropometrics   Estimated body mass index is 29.35 kg/m?? as calculated from the following:    Height as of 05/06/22: 172.7 cm (5' 8).    Weight as of 05/06/22: 87.5 kg (193 lb).    Wt Readings from Last 5 Encounters:   05/06/22 87.5 kg (193 lb)   04/08/22 86.7 kg (191 lb 1.6 oz)   04/08/22 86.7 kg (191 lb 1.6 oz)   03/11/22 86 kg (189 lb 11.2 oz)   02/25/22 88.6 kg (195 lb 4.8 oz)      Usual body weight: ~193 lbs for the past few weeks    ~201 lb on home scale in June 2023   198 lbs per patient prior to transplant  Ideal Body Weight:   69.9 kg       Nutrition Risk Screening:     Nutrition Focused Physical Exam:    Unable to assess given virtual visit             Malnutrition Screening:   Malnutrition Assessment using AND/ASPEN Clinical Characteristics:     Unable to assess given inability to perform nutrition focused physical exam        Biochemical Data, Medical Tests and Procedures:  All pertinent labs and imaging reviewed by Idolina Primer, RD/LDN at 8:47 AM 05/07/2022.    Absolute Neutrophil= 3.5- 4.3    Patient has a Libre 14. He reports his blood glucoses have been pretty normal, nothing very high. He reports his 7 day average is ~150 and his 30 day average is 145.       Lab Results   Component Value Date    Hemoglobin A1C 6.5 (H) 05/06/2022    Hemoglobin A1C 5.2 02/05/2022    Hemoglobin A1C 4.7 (L) 01/14/2022      No results found for: VITAMINA  Lab Results   Component Value Date    Vitamin D Total (25OH) 27.9 09/11/2020    Vitamin D Total (25OH) 13.9 (L) 05/25/2019     No results found for: VITAME  No results found for: CRP  No results found for: ZINC  No results found for: COPPER    Lab Results   Component Value Date    BUN 26 (H) 05/06/2022    CREATININE 1.59 (H) 05/06/2022    GFRAA 33 (L) 03/22/2022    GFRNONAA 72 04/05/2019    NA 140 05/06/2022    K 5.8 (H) 05/06/2022    CL 107 05/06/2022    CO2 24.0 05/06/2022    CALCIUM 9.1 05/06/2022    PHOS 4.1 05/06/2022    ALBUMIN 4.2 05/06/2022       Medications and Vitamin/Mineral Supplementation:   All nutritionally pertinent medications reviewed on 05/07/2022.   Nutritionally pertinent medications include: novolog, tresiba, colace, veltassa  He is not taking nutrition supplements. Magnesium oxide      Current Outpatient Medications   Medication Sig Dispense Refill    acetaminophen (TYLENOL) 325 MG tablet Take 1-2 tablets (325-650 mg total) by mouth every  six (6) hours as needed for pain or fever. 100 tablet 11    amLODIPine (NORVASC) 10 MG tablet Take 1 tablet (10 mg total) by mouth daily. 30 tablet 11    aspirin (ECOTRIN) 81 MG tablet Take 1 tablet (81 mg total) by mouth daily. 90 tablet 3    atorvastatin (LIPITOR) 40 MG tablet Take 1 tablet (40 mg total) by mouth daily. 90 tablet 3    atovaquone (MEPRON) 750 mg/5 mL suspension Take 10 mL (1,500 mg total) by mouth daily. 300 mL 3    blood sugar diagnostic (ACCU-CHEK GUIDE TEST STRIPS) Strp Use to check blood sugar as directed with insulin 3 times a day & for symptoms of high or low blood sugar 100 strip 11    blood-glucose meter Misc Use to test blood sugar as directed 1 each 0    blood-glucose meter kit Use as instructed 1 each 0    docusate sodium (COLACE) 100 MG capsule Take 1 capsule (100 mg total) by mouth two (2) times a day as needed for constipation. 60 capsule 11    DULoxetine (CYMBALTA) 30 MG capsule Take 1 capsule (30 mg total) by mouth daily. 90 capsule 3    everolimus, immunosuppressive, 1 mg Tab Take 3 mg by mouth Two (2) times a day. 180 tablet 11    gabapentin (NEURONTIN) 300 MG capsule Take 1 capsule (300 mg total) by mouth Three (3) times a day. 90 capsule 2    glucagon spray 3 mg/actuation Spry Use 1 spray into the left nostril every fifteen (15) minutes as needed (low blood sugar). 2 each 1    hydrOXYzine (VISTARIL) 50 MG capsule Take 1 capsule (50 mg total) by mouth nightly as needed for anxiety. 30 capsule 3    insulin ASPART (NOVOLOG FLEXPEN) 100 unit/mL (3 mL) injection pen Inject under the skin per sliding scale prior to meals. If premeal BG 151-200 take 2 additional units for correction, if 201-250 take 4 additional units, etc. Max dose 36 units per day.  Store in-use prefilled pens at room temperature <86??F and use within 28 days; do not refrigerate. 15 mL 6    insulin degludec (TRESIBA FLEXTOUCH U-100) 100 unit/mL (3 mL) InPn Inject 0.13 mL (13 Units total) under the skin daily. Adjust as instructed. 15 mL 3    lancets Misc Use to check blood sugar as directed with insulin 3 times a day & for symptoms of high or low blood sugar. 100 each 0    magnesium oxide-Mg AA chelate (MAGNESIUM, AMINO ACID CHELATE,) 133 mg Take 2 tablets by mouth two (2) times a day. 360 tablet 3    melatonin 3 mg Tab Take 1 tablet (3 mg total) by mouth every evening. 30 tablet 11    methocarbamoL (ROBAXIN) 500 MG tablet Take 1 tablet (500 mg total) by mouth Three (3) times a day as needed. 90 tablet 0    pantoprazole (PROTONIX) 40 MG tablet Take 1 tablet (40 mg total) by mouth daily as needed. 30 tablet 11    patiromer calcium sorbitex (VELTASSA) 16.8 gram powder packet Take 1 packet (16.8 g total) by mouth daily. 30 packet 2    pen needle, diabetic (BD ULTRA-FINE NANO PEN NEEDLE) 32 gauge x 5/32 (4 mm) Ndle Use as directed  1-2 times per day 100 each 12    tacrolimus (PROGRAF) 1 MG capsule Take 6 capsules (6 mg total) by mouth daily AND 5 capsules (5 mg total) nightly. 330 capsule 11  No current facility-administered medications for this visit.       Nutrition History:     Dietary Restrictions: No known food allergies or food intolerances.     Gastrointestinal Issues: Denied issues No pain or nausea. He does report having more bowel movements, but not more than 5x per day. He reports having some formed and some loose bowel movements, he is on colace.     Hunger and Satiety: Poor appetite and limited intake.   Early satiety.   Patient reports having a good appetite now that his staples are out     Food Safety and Access: No to little issues noted.     Diet Recall:   Time Intake   Breakfast Boost Original   Cereal + milk and egg and sausage or oatmeal    Snack (AM)    Lunch Protein + vegetable (usually lima beans or green beans)   Snack (PM)    Dinner Cheeseburger and spinach salad     Snack (HS) Boost Original      Food-Related History:     Beverages: 3-4 bottles of water per day, Boost Original - Chocolate  Dining Out:  Minimal   Usual Food Choices: peas, green beans, peanut butter, lima beans, green beans, milk, macaroni & cheese, chicken, brown rice   Does not eat fruit per his report     Physical Activity:  Physical activity level is sedentary with little to no exercise.    He walks around the house but has not been exerting himself much.     Daily Estimated Nutrient Needs:  Energy: 1750- 2100 kcals [25- 30 kcal/kg using ideal body weight, 70 kg]    Protein: 85- 105 gm [1.2 -1.5 gm/kg using ideal body weight, 70 kg]  Fluid:   [per MD team]  Sodium:  <2000 mg     Nutrition Goals & Evaluation      1. Meet estimated daily needs (Progressing)  2. Normal vitamin levels (Ongoing)  3. Balanced macronutrient intake (Progressing)  4. Hemoglobin A1c <7% (Met)    Nutrition goals reviewed, and relevant barriers identified and addressed: emotional state. He is evaluated to have good willingness and ability to achieve nutrition goals.     Nutrition Assessment     Per the patient's diet recall, nutrition intake is inadequate but is improving and is close to meeting estimated nutrition needs. Patient's intake of Boost shakes continues, and discussed that he could decrease his intake as solid food intake increases. Reiterated importance of having no more than 2 bottles per day and eating 2-3 snacks per day of peanut butter, eggs etc to improve labs and overall nutrition. Boost Original has ~470 mg K+ which is a high in potassium.  Discussed high potassium foods and recommended limiting his intake of potassium. Patient reports he knew his potassium would be high this lab draw [place] diet was not good because there were not many options available to him. He also had orange juice at the hotel, which he knows it is high in potassium.  Patient with relatively good glucose control post transplant in light of prednisone.     Nutrition Intervention      Nutrition Education: liberalize food safety precautions     Materials Provided were: verbal tips and suggestions     Nutrition Plan:    Liberalize food safety precautions   Consume 1 oral nutrition supplement daily   Eat 4 small, frequent protein containing meals  per day   Space meals out every 3-4 hours while awake  Increase exercise/safe movement to 30 minutes, 5 days per week     Follow up will occur at 6 months post transplant.     Food/Nutrition-related history and Anthropometric measurements will be assessed at time of follow-up.         Recommendations for Care Team:  Monitor potassium level and blood glucoses          The patient reports they are currently: at home. I spent 22 minutes on the phone visit with the patient on the date of service. I spent an additional 28  minutes on pre- and post-visit activities on the date of service.     The patient was not located and I was located within 250 yards of a hospital based location during the phone visit. The patient was physically located in [place] or a state in which I am permitted to provide care. The patient and/or parent/guardian understood that s/he may incur co-pays and cost sharing, and agreed to the telemedicine visit. The visit was reasonable and appropriate under the circumstances given the patient's presentation at the time.    The patient and/or parent/guardian has been advised of the potential risks and limitations of this mode of treatment (including, but not limited to, the absence of in-person examination) and has agreed to be treated using telemedicine. The patient's/patient's family's questions regarding telemedicine have been answered.    If the visit was completed in an ambulatory setting, the patient and/or parent/guardian has also been advised to contact their provider???s office for worsening conditions, and seek emergency medical treatment and/or call 911 if the patient deems either necessary.      Lanelle Bal, RD, LDN  Abdominal Transplant Dietitian   Pager: [phone]

## 2022-05-07 NOTE — Unmapped (Signed)
The patient reports they are currently: at home. I spent 20 minutes on the real-time audio and video with the patient on the date of service. I spent an additional 10 minutes on pre- and post-visit activities on the date of service.     The patient was physically located in [place] or a state in which I am permitted to provide care. The patient and/or parent/guardian understood that s/he may incur co-pays and cost sharing, and agreed to the telemedicine visit. The visit was reasonable and appropriate under the circumstances given the patient's presentation at the time.    The patient and/or parent/guardian has been advised of the potential risks and limitations of this mode of treatment (including, but not limited to, the absence of in-person examination) and has agreed to be treated using telemedicine. The patient's/patient's family's questions regarding telemedicine have been answered.     If the visit was completed in an ambulatory setting, the patient and/or parent/guardian has also been advised to contact their provider???s office for worsening conditions, and seek emergency medical treatment and/or call 911 if the patient deems either necessary.          **THIS PATIENT WAS NOT SEEN IN PERSON TO MINIMIZE POTENTIAL SPREAD OF COVID-19, PROTECT PATIENTS/PROVIDERS, AND REDUCE PPE UTILIZATION.**    PATIENT NAME: Matthew Key     MR#: [id number]    DOB: 06/01/1961    [facility]  CONFIDENTIAL SOCIAL WORK   3 MONTH LIVER POST TRANSPLANT FOLLOW UP      DATE OF EVALUATION: 05/07/2022    INFORMANTS: Patient/Matthew Key    PREFERRED LANGUAGE: English     INTERPRETER UTILIZED: N/A    TXP CARE TEAM:   Post Transplant RN Coordinator: Emilio Math [phone]  Primary Transplant Provider: Varney Baas, Griffith Citron, Gwyneth Revels, Chirag Sureshchandra Desai, [place] Jacinta Shoe Price    REFERRAL INFORMATION:    Matthew Key is a 61 y.o. Caucasian male is s/p transplant for liver transplantation . CSW follows up to assess 3 months.    TRANSPLANT DATE:   02/06/2022 (Liver)    MOST RECENT HOSPITAL ADMISSION (@ [place]):   Previous admit date: 02/05/2022 to 04/08/22    FUTURE APPOINTMENTS (@ [place]):   Future Appointments   Date Time Provider Department Center   07/17/2022 11:30 AM Diket, Jake Bathe, PsyD [facility]   07/30/2022 10:30 AM Janyth Pupa, MD [facility]   07/31/2022 11:30 AM Bridgens, Jorja Loa, RD/LDN nutr [facility]   08/01/2022  2:00 PM [facility]ERY PHARM[facility]   11/05/2022 10:30 AM Janyth Pupa, MD [facility]   11/07/2022  2:00 PM TRANSPLANT SURGERY PHARMACY [facility]   02/04/2023  8:30 AM [facility] [place]   02/04/2023 10:00 AM Janyth Pupa, MD [facility]   02/06/2023  8:30 AM TRANSPLANT SURGERY PHARMACY [facility]   02/06/2023 10:00 AM Bridgens, Jorja Loa, RD/LDN nutr [facility]   02/07/2023  9:00 AM Smiley House[facility]   02/07/2023 11:30 AM Diket, Jake Bathe, PsyD [facility]       HOME HEALTH/DME NEEDS AT LAST DC:   HH: N/A   Services: N/A   Contact: n/a  DME: N/A  Other: N/A  Other Remaining:   Ureter stent: No  Staples: No  Surgical drains x0: No  PD Cath: N/A  Central line: N/A    LIFESTYLE:  Physical activity:  Good  Nutrition/Appetite:  Good; continues to improve   Sleep: Good    SOCIAL HISTORY:  Marital Status: married  Lives with: wife  Children/Dependents: adult son   Other Social support: sister; overall, fractured support post transplant   Housing: stable good repair, utilities function, appliances function, and Heat/AC function    CAREGIVING PLAN:  Location:  wife and sister   Support/Caregivers:  fractured relationship - see prior CSW notes   Household tasks/errands:  n/a  Medication:  n/a  Transportation: n/a    COMPLIANCE HISTORY:  Medication Adherence: Excellent  Medication Concerns: denied problems taking medications, concerns about side effects, affordability, problems obtaining medications, and difficulty remembering medications  Other Adherence: Excellent     Side Effects:  frequent urination; per team, continue to monitor     INSURANCE:  [employer] assistance: N/A  Payer/Plan Subscriber Name Rel Member # Group #   [employer] MEDICARE ADV [name], [name] Self [id number]      [address] [address]       INCOME:   Continues to receive disability. Currently, no income concerns.     ATTITUDE ABOUT TRANSPLANT:  Expectations: less worry in post phase; letting go of worry   Fears/Concerns: denied; see above   What would you change?:  immunosuppressed population not having to receive care by going through the ED   Rec'd Info on Ltr to Donor Family: yes    MENTAL HEALTH HISTORY: reflective of current   Current issues/mood: denies  Medications: denies  Therapy: denies  SI/HI: denies    PHQ-2 Score:  0  GAD-7 Score:  0    SUBSTANCE HISTORY: reflective of current   Tobacco: denies   Alcohol: denies   Illicit Substances: denies   OTC/Supplements: denies     PAIN HISTORY: reflective of current  Current : not taking any prescribed medications; ongoing back pain   Current use of pain medication/pain control: Tylenol PRN    SUMMARY:  Matthew Key was pleasant today. He is in the process of renewing application for food stamps and having difficulty; advised him to get in contact with DSS case manager and he said he would call today to schedule an appointment. Currently, staying at a hotel as his wife is ill with a head cold. Reports stability at home. He has been released to drive and is appreciative of his independence again.      Anticipatory guidance/education provided on the following:  --Possibility of readmissions, unplanned clinic visits, extra lab work  --Possibility of mood changes, including when to call providers  --Gradual decline in frequency of clinic visits and lab work over time  --Importance of communication with txp team  --Caution to talk w/ txp team before taking any other prescriptions or OTC medications (incl vitamins, supplements. herbal teas)  --Additional infection control precautions during acute recovery phase (and ongoing)        RECOMMENDATIONS:   1. Follow up as scheduled with CSW for post care         Flint Melter, LCSW, CCTSW  Transplant Case Manager/Social Worker  [facility]  Completed: 05/07/22

## 2022-05-08 DIAGNOSIS — Z944 Liver transplant status: Principal | ICD-10-CM

## 2022-05-08 DIAGNOSIS — E612 Magnesium deficiency: Principal | ICD-10-CM

## 2022-05-08 DIAGNOSIS — E875 Hyperkalemia: Principal | ICD-10-CM

## 2022-05-08 LAB — VITAMIN D 25 HYDROXY: VITAMIN D, TOTAL (25OH): 43.1 ng/mL (ref 20.0–80.0)

## 2022-05-08 NOTE — Unmapped (Signed)
Placed oto [facility] orders to recheck patient's electrolytes.

## 2022-05-08 NOTE — Unmapped (Signed)
Addended by: Genia Harold on: 05/08/2022 07:15 AM     Modules accepted: Orders

## 2022-05-08 NOTE — Unmapped (Signed)
Patient seen in clinic alone today for his 3 month liver transplant follow up. He reports he is doing well. He continues to report pain to his right forearm into his hand, which per him has improved slightly. He denies any n/v/d/constipation/fever/chills/dysuria or issues along his incisional area or hernias. He has mild LE edema today, but reports that it resolves when he elevates his feet. His weight has been fairly stable since transplant. He reports he is increasing his activity level and is walking >1 mile 2-3 times daily without use of a cane now. He is hydrating well, with about 6 bottles of water daily. He stopped ozempic dt low bps, but will be restarted on this. He was encouraged to f/up with his local endocrinologist for ongoing glucose mgmt., since his A1c has increased since txp. His valcyte will be stopped this week. He took his IS meds at 11pm last night. Spent 25 min on post-liver txp health maintenance education and reinforced the importance of taking his IS meds at a set hr, day/night. IS meds will be adjusted per pharmacy recommendation after results today. Since K was elevated, he was instructed to take one of his spare 8.4mg  veltassa packet x 3 days in addition standing 16.8mg  dose daily and repeat labs Wed afternoon to recheck K. He will complete CT scan to evaluate source of arm pain. He is aware, he will be contacted with findings. He verbalized understanding of all discussed. He received flu shot during this visit. Per surgery, patient to rtc for 6 mos visit, unless other issue arise in the interim.

## 2022-05-10 DIAGNOSIS — R52 Pain, unspecified: Principal | ICD-10-CM

## 2022-05-10 LAB — GAMMA GT: GAMMA GLUTAMYL TRANSFERASE: 35 IU/L (ref 0–65)

## 2022-05-10 LAB — COMPREHENSIVE METABOLIC PANEL
A/G RATIO: 2 (ref 1.2–2.2)
ALBUMIN: 4.4 g/dL (ref 3.9–4.9)
ALKALINE PHOSPHATASE: 107 IU/L (ref 44–121)
ALT (SGPT): 13 IU/L (ref 0–44)
AST (SGOT): 14 IU/L (ref 0–40)
BILIRUBIN TOTAL (MG/DL) IN SER/PLAS: 0.3 mg/dL (ref 0.0–1.2)
BLOOD UREA NITROGEN: 24 mg/dL (ref 8–27)
BUN / CREAT RATIO: 16 (ref 10–24)
CALCIUM: 9.6 mg/dL (ref 8.6–10.2)
CHLORIDE: 104 mmol/L (ref 96–106)
CO2: 22 mmol/L (ref 20–29)
CREATININE: 1.46 mg/dL — ABNORMAL HIGH (ref 0.76–1.27)
EGFR: 54 mL/min/{1.73_m2} — ABNORMAL LOW
GLOBULIN, TOTAL: 2.2 g/dL (ref 1.5–4.5)
GLUCOSE: 124 mg/dL — ABNORMAL HIGH (ref 70–99)
POTASSIUM: 4.8 mmol/L (ref 3.5–5.2)
SODIUM: 139 mmol/L (ref 134–144)
TOTAL PROTEIN: 6.6 g/dL (ref 6.0–8.5)

## 2022-05-10 LAB — CBC W/ DIFFERENTIAL
BASOPHILS ABSOLUTE COUNT: 0.1 10*3/uL (ref 0.0–0.2)
BASOPHILS RELATIVE PERCENT: 1 %
EOSINOPHILS ABSOLUTE COUNT: 0.2 10*3/uL (ref 0.0–0.4)
EOSINOPHILS RELATIVE PERCENT: 4 %
HEMATOCRIT: 37.4 % — ABNORMAL LOW (ref 37.5–51.0)
HEMOGLOBIN: 12.1 g/dL — ABNORMAL LOW (ref 13.0–17.7)
LYMPHOCYTES ABSOLUTE COUNT: 1.6 10*3/uL (ref 0.7–3.1)
LYMPHOCYTES RELATIVE PERCENT: 28 %
MEAN CORPUSCULAR HEMOGLOBIN CONC: 32.4 g/dL (ref 31.5–35.7)
MEAN CORPUSCULAR HEMOGLOBIN: 27.8 pg (ref 26.6–33.0)
MEAN CORPUSCULAR VOLUME: 86 fL (ref 79–97)
MONOCYTES ABSOLUTE COUNT: 0.6 10*3/uL (ref 0.1–0.9)
MONOCYTES RELATIVE PERCENT: 10 %
NEUTROPHILS ABSOLUTE COUNT: 3.2 10*3/uL (ref 1.4–7.0)
NEUTROPHILS RELATIVE PERCENT: 57 %
PLATELET COUNT: 168 10*3/uL (ref 150–450)
RED BLOOD CELL COUNT: 4.35 x10E6/uL (ref 4.14–5.80)
RED CELL DISTRIBUTION WIDTH: 12.5 % (ref 11.6–15.4)
WHITE BLOOD CELL COUNT: 5.6 10*3/uL (ref 3.4–10.8)

## 2022-05-10 LAB — PHOSPHORUS: PHOSPHORUS, SERUM: 4 mg/dL (ref 2.8–4.1)

## 2022-05-10 LAB — BILIRUBIN, DIRECT: BILIRUBIN DIRECT: 0.13 mg/dL (ref 0.00–0.40)

## 2022-05-10 LAB — MAGNESIUM: MAGNESIUM: 1.6 mg/dL (ref 1.6–2.3)

## 2022-05-10 MED ORDER — DULOXETINE 30 MG CAPSULE,DELAYED RELEASE
ORAL_CAPSULE | Freq: Every day | ORAL | 3 refills | 90 days | Status: CP
Start: 2022-05-10 — End: 2023-05-10

## 2022-05-10 NOTE — Unmapped (Signed)
Patient's right arm CT without significant finding for his c/o arm pain. Per PA Czepiga, he has experienced some relief with cymbalta and could benefit from increasing the dose but if not, will consider MRI study next. Per PharmD Chargualaf, will increase his dose to 60mg  daily. Labs today are stable, but everolimus/tac pending.     Contacted patient and relayed CT results and recommendation for high dose of cymbalta. He verbalized understanding.

## 2022-05-11 LAB — TACROLIMUS LEVEL: TACROLIMUS BLOOD: 9.9 ng/mL (ref 2.0–20.0)

## 2022-05-11 MED ORDER — BLOOD-GLUCOSE METER
0 refills | 0 days
Start: 2022-05-11 — End: 2023-05-11

## 2022-05-11 MED ORDER — LANCETS
0 refills | 0 days
Start: 2022-05-11 — End: ?

## 2022-05-13 DIAGNOSIS — Z944 Liver transplant status: Principal | ICD-10-CM

## 2022-05-13 DIAGNOSIS — R52 Pain, unspecified: Principal | ICD-10-CM

## 2022-05-13 DIAGNOSIS — Z796 Long-term use of immunosuppressant medication: Principal | ICD-10-CM

## 2022-05-13 LAB — EVEROLIMUS: EVEROLIMUS LEVEL: 1.8 ng/mL — ABNORMAL LOW (ref 3.0–8.0)

## 2022-05-13 MED ORDER — BLOOD-GLUCOSE METER KIT WRAPPER
1 refills | 0 days | Status: CP
Start: 2022-05-13 — End: 2023-05-13
  Filled 2022-05-14: qty 1, 30d supply, fill #0

## 2022-05-13 MED ORDER — LANCETS
0 refills | 0 days | Status: CP
Start: 2022-05-13 — End: ?
  Filled 2022-05-14: qty 100, 25d supply, fill #0

## 2022-05-13 MED ORDER — TACROLIMUS 1 MG CAPSULE, IMMEDIATE-RELEASE
ORAL_CAPSULE | Freq: Two times a day (BID) | ORAL | 11 refills | 30 days | Status: CP
Start: 2022-05-13 — End: ?

## 2022-05-13 MED ORDER — DULOXETINE 30 MG CAPSULE,DELAYED RELEASE
ORAL_CAPSULE | Freq: Every day | ORAL | 3 refills | 90 days | Status: CP
Start: 2022-05-13 — End: 2023-05-13
  Filled 2022-08-07: qty 180, 90d supply, fill #0

## 2022-05-13 NOTE — Unmapped (Signed)
Patient's 9/28 tacrolimus above goal of 3-5. Per PharmD Chargualaf, will reduce his dose to 5mg  bid. Spoke to patient and relayed dose reduction. He verbalized understanding.

## 2022-05-13 NOTE — Unmapped (Signed)
The patient is requesting a medication refill

## 2022-05-14 LAB — CBC W/ DIFFERENTIAL
BASOPHILS ABSOLUTE COUNT: 0.1 10*3/uL (ref 0.0–0.2)
BASOPHILS RELATIVE PERCENT: 1 %
EOSINOPHILS ABSOLUTE COUNT: 0.1 10*3/uL (ref 0.0–0.4)
EOSINOPHILS RELATIVE PERCENT: 1 %
HEMATOCRIT: 41.6 % (ref 37.5–51.0)
HEMOGLOBIN: 13.2 g/dL (ref 13.0–17.7)
LYMPHOCYTES ABSOLUTE COUNT: 1.9 10*3/uL (ref 0.7–3.1)
LYMPHOCYTES RELATIVE PERCENT: 34 %
MEAN CORPUSCULAR HEMOGLOBIN CONC: 31.7 g/dL (ref 31.5–35.7)
MEAN CORPUSCULAR HEMOGLOBIN: 27.4 pg (ref 26.6–33.0)
MEAN CORPUSCULAR VOLUME: 87 fL (ref 79–97)
MONOCYTES ABSOLUTE COUNT: 0.1 10*3/uL (ref 0.1–0.9)
MONOCYTES RELATIVE PERCENT: 1 %
NEUTROPHILS ABSOLUTE COUNT: 2.6 10*3/uL (ref 1.4–7.0)
NEUTROPHILS RELATIVE PERCENT: 45 %
PLATELET COUNT: 196 10*3/uL (ref 150–450)
RED BLOOD CELL COUNT: 4.81 x10E6/uL (ref 4.14–5.80)
RED CELL DISTRIBUTION WIDTH: 12.5 % (ref 11.6–15.4)
WHITE BLOOD CELL COUNT: 5.7 10*3/uL (ref 3.4–10.8)

## 2022-05-14 LAB — COMPREHENSIVE METABOLIC PANEL
A/G RATIO: 2.1 (ref 1.2–2.2)
ALBUMIN: 4.8 g/dL (ref 3.9–4.9)
ALKALINE PHOSPHATASE: 119 IU/L (ref 44–121)
ALT (SGPT): 17 IU/L (ref 0–44)
AST (SGOT): 17 IU/L (ref 0–40)
BILIRUBIN TOTAL (MG/DL) IN SER/PLAS: 0.4 mg/dL (ref 0.0–1.2)
BLOOD UREA NITROGEN: 21 mg/dL (ref 8–27)
BUN / CREAT RATIO: 15 (ref 10–24)
CALCIUM: 9.6 mg/dL (ref 8.6–10.2)
CHLORIDE: 103 mmol/L (ref 96–106)
CO2: 19 mmol/L — ABNORMAL LOW (ref 20–29)
CREATININE: 1.41 mg/dL — ABNORMAL HIGH (ref 0.76–1.27)
EGFR: 56 mL/min/{1.73_m2} — ABNORMAL LOW
GLOBULIN, TOTAL: 2.3 g/dL (ref 1.5–4.5)
GLUCOSE: 190 mg/dL — ABNORMAL HIGH (ref 70–99)
POTASSIUM: 4.9 mmol/L (ref 3.5–5.2)
SODIUM: 140 mmol/L (ref 134–144)
TOTAL PROTEIN: 7.1 g/dL (ref 6.0–8.5)

## 2022-05-14 LAB — GAMMA GT: GAMMA GLUTAMYL TRANSFERASE: 37 IU/L (ref 0–65)

## 2022-05-14 LAB — PHOSPHORUS: PHOSPHORUS, SERUM: 3.8 mg/dL (ref 2.8–4.1)

## 2022-05-14 LAB — MAGNESIUM: MAGNESIUM: 1.6 mg/dL (ref 1.6–2.3)

## 2022-05-14 LAB — IMMATURE CELLS
METAMYLOCYTES-LABCORP: 14 % — ABNORMAL HIGH (ref 0–0)
MYELOCYTES: 4 % — ABNORMAL HIGH (ref 0–0)

## 2022-05-14 LAB — BILIRUBIN, DIRECT: BILIRUBIN DIRECT: 0.12 mg/dL (ref 0.00–0.40)

## 2022-05-14 MED FILL — DOCUSATE SODIUM 100 MG CAPSULE: ORAL | 30 days supply | Qty: 60 | Fill #2

## 2022-05-14 MED FILL — NOVOLOG FLEXPEN U-100 INSULIN ASPART 100 UNIT/ML (3 ML) SUBCUTANEOUS: 41 days supply | Qty: 15 | Fill #1

## 2022-05-14 MED FILL — ACCU-CHEK GUIDE TEST STRIPS: 25 days supply | Qty: 100 | Fill #2

## 2022-05-14 MED FILL — VELTASSA 16.8 GRAM ORAL POWDER PACKET: ORAL | 30 days supply | Qty: 30 | Fill #1

## 2022-05-14 MED FILL — ULTICARE PEN NEEDLE 32 GAUGE X 5/32" (4 MM): 50 days supply | Qty: 100 | Fill #1

## 2022-05-14 NOTE — Unmapped (Addendum)
[facility] Pharmacist has reviewed a new prescription for tacrolimus that indicates a dose decrease.  Patient was counseled on this dosage change by [facility]- see epic note from 05/13/22.  Next refill call date adjusted if necessary.        Clinical Assessment Needed For: Dose Change  Medication: Tacrolimus 1mg  capsule  Last Fill Date/Day Supply: 05/06/2022 / 30 days  Refill Too Soon until 05/29/2022  Was previous dose already scheduled to fill: No    Notes to Pharmacist: Will re-test on 10/18

## 2022-05-15 LAB — EVEROLIMUS: EVEROLIMUS LEVEL: 3.3 ng/mL (ref 3.0–8.0)

## 2022-05-15 LAB — SPECIMEN STATUS REPORT

## 2022-05-15 LAB — TACROLIMUS LEVEL: TACROLIMUS BLOOD: 8.8 ng/mL (ref 2.0–20.0)

## 2022-05-16 LAB — COMPREHENSIVE METABOLIC PANEL
A/G RATIO: 2 (ref 1.2–2.2)
ALBUMIN: 4.4 g/dL (ref 3.9–4.9)
ALKALINE PHOSPHATASE: 118 IU/L (ref 44–121)
ALT (SGPT): 15 IU/L (ref 0–44)
AST (SGOT): 15 IU/L (ref 0–40)
BILIRUBIN TOTAL (MG/DL) IN SER/PLAS: 0.2 mg/dL (ref 0.0–1.2)
BLOOD UREA NITROGEN: 21 mg/dL (ref 8–27)
BUN / CREAT RATIO: 15 (ref 10–24)
CALCIUM: 9.4 mg/dL (ref 8.6–10.2)
CHLORIDE: 104 mmol/L (ref 96–106)
CO2: 21 mmol/L (ref 20–29)
CREATININE: 1.39 mg/dL — ABNORMAL HIGH (ref 0.76–1.27)
GLOBULIN, TOTAL: 2.2 g/dL (ref 1.5–4.5)
GLUCOSE: 190 mg/dL — ABNORMAL HIGH (ref 70–99)
POTASSIUM: 4.9 mmol/L (ref 3.5–5.2)
SODIUM: 140 mmol/L (ref 134–144)
TOTAL PROTEIN: 6.6 g/dL (ref 6.0–8.5)

## 2022-05-16 LAB — CBC W/ DIFFERENTIAL
BASOPHILS ABSOLUTE COUNT: 0 10*3/uL (ref 0.0–0.2)
BASOPHILS RELATIVE PERCENT: 0 %
EOSINOPHILS ABSOLUTE COUNT: 0.2 10*3/uL (ref 0.0–0.4)
EOSINOPHILS RELATIVE PERCENT: 3 %
HEMATOCRIT: 36.5 % — ABNORMAL LOW (ref 37.5–51.0)
HEMOGLOBIN: 12.1 g/dL — ABNORMAL LOW (ref 13.0–17.7)
LYMPHOCYTES ABSOLUTE COUNT: 1.2 10*3/uL (ref 0.7–3.1)
LYMPHOCYTES RELATIVE PERCENT: 24 %
MEAN CORPUSCULAR HEMOGLOBIN CONC: 33.2 g/dL (ref 31.5–35.7)
MEAN CORPUSCULAR HEMOGLOBIN: 27.4 pg (ref 26.6–33.0)
MEAN CORPUSCULAR VOLUME: 83 fL (ref 79–97)
MONOCYTES ABSOLUTE COUNT: 0.4 10*3/uL (ref 0.1–0.9)
MONOCYTES RELATIVE PERCENT: 7 %
NEUTROPHILS ABSOLUTE COUNT: 3.4 10*3/uL (ref 1.4–7.0)
NEUTROPHILS RELATIVE PERCENT: 66 %
PLATELET COUNT: 171 10*3/uL (ref 150–450)
RED BLOOD CELL COUNT: 4.41 x10E6/uL (ref 4.14–5.80)
RED CELL DISTRIBUTION WIDTH: 12.4 % (ref 11.6–15.4)
WHITE BLOOD CELL COUNT: 5.1 10*3/uL (ref 3.4–10.8)

## 2022-05-16 LAB — BILIRUBIN, DIRECT: BILIRUBIN DIRECT: 0.11 mg/dL (ref 0.00–0.40)

## 2022-05-16 LAB — MAGNESIUM: MAGNESIUM: 1.6 mg/dL (ref 1.6–2.3)

## 2022-05-16 LAB — PHOSPHORUS: PHOSPHORUS, SERUM: 3.3 mg/dL (ref 2.8–4.1)

## 2022-05-16 LAB — GAMMA GT: GAMMA GLUTAMYL TRANSFERASE: 31 IU/L (ref 0–65)

## 2022-05-17 DIAGNOSIS — Z944 Liver transplant status: Principal | ICD-10-CM

## 2022-05-17 LAB — EVEROLIMUS: EVEROLIMUS LEVEL: 3.3 ng/mL (ref 3.0–8.0)

## 2022-05-17 NOTE — Unmapped (Signed)
Therapy Update Follow Up: Prior Authorization Required - Referral submitted ([facility])

## 2022-05-20 DIAGNOSIS — Z5181 Encounter for therapeutic drug level monitoring: Principal | ICD-10-CM

## 2022-05-20 DIAGNOSIS — Z944 Liver transplant status: Principal | ICD-10-CM

## 2022-05-20 DIAGNOSIS — E612 Magnesium deficiency: Principal | ICD-10-CM

## 2022-05-20 DIAGNOSIS — Z796 Long-term use of immunosuppressant medication: Principal | ICD-10-CM

## 2022-05-20 NOTE — Unmapped (Signed)
Patient's 10/4 Everolimus resulted within goal, but no Tac level processed with these orders, per [facility] customer service. Spoke with patient and reinforced the importance of his telling the phlebotomist at [facility], that both should be processed. Placed new standing [facility] orders.

## 2022-05-21 LAB — COMPREHENSIVE METABOLIC PANEL
A/G RATIO: 2 (ref 1.2–2.2)
ALBUMIN: 4.4 g/dL (ref 3.9–4.9)
ALKALINE PHOSPHATASE: 120 IU/L (ref 44–121)
ALT (SGPT): 29 IU/L (ref 0–44)
AST (SGOT): 32 IU/L (ref 0–40)
BILIRUBIN TOTAL (MG/DL) IN SER/PLAS: 0.4 mg/dL (ref 0.0–1.2)
BLOOD UREA NITROGEN: 16 mg/dL (ref 8–27)
BUN / CREAT RATIO: 13 (ref 10–24)
CALCIUM: 10.4 mg/dL — ABNORMAL HIGH (ref 8.6–10.2)
CHLORIDE: 102 mmol/L (ref 96–106)
CO2: 26 mmol/L (ref 20–29)
CREATININE: 1.25 mg/dL (ref 0.76–1.27)
GLOBULIN, TOTAL: 2.2 g/dL (ref 1.5–4.5)
GLUCOSE: 146 mg/dL — ABNORMAL HIGH (ref 70–99)
POTASSIUM: 4.9 mmol/L (ref 3.5–5.2)
SODIUM: 142 mmol/L (ref 134–144)
TOTAL PROTEIN: 6.6 g/dL (ref 6.0–8.5)

## 2022-05-21 LAB — PHOSPHORUS: PHOSPHORUS, SERUM: 4.3 mg/dL — ABNORMAL HIGH (ref 2.8–4.1)

## 2022-05-21 LAB — CBC W/ DIFFERENTIAL
BASOPHILS ABSOLUTE COUNT: 0.2 10*3/uL (ref 0.0–0.2)
BASOPHILS RELATIVE PERCENT: 2 %
EOSINOPHILS ABSOLUTE COUNT: 0.3 10*3/uL (ref 0.0–0.4)
EOSINOPHILS RELATIVE PERCENT: 4 %
HEMATOCRIT: 39.9 % (ref 37.5–51.0)
HEMOGLOBIN: 13.1 g/dL (ref 13.0–17.7)
LYMPHOCYTES ABSOLUTE COUNT: 1.5 10*3/uL (ref 0.7–3.1)
LYMPHOCYTES RELATIVE PERCENT: 18 %
MEAN CORPUSCULAR HEMOGLOBIN CONC: 32.8 g/dL (ref 31.5–35.7)
MEAN CORPUSCULAR HEMOGLOBIN: 27.9 pg (ref 26.6–33.0)
MEAN CORPUSCULAR VOLUME: 85 fL (ref 79–97)
MONOCYTES ABSOLUTE COUNT: 0.9 10*3/uL (ref 0.1–0.9)
MONOCYTES RELATIVE PERCENT: 11 %
NEUTROPHILS ABSOLUTE COUNT: 5.2 10*3/uL (ref 1.4–7.0)
NEUTROPHILS RELATIVE PERCENT: 60 %
PLATELET COUNT: 179 10*3/uL (ref 150–450)
RED BLOOD CELL COUNT: 4.7 x10E6/uL (ref 4.14–5.80)
RED CELL DISTRIBUTION WIDTH: 12.4 % (ref 11.6–15.4)
WHITE BLOOD CELL COUNT: 8.6 10*3/uL (ref 3.4–10.8)

## 2022-05-21 LAB — GAMMA GT: GAMMA GLUTAMYL TRANSFERASE: 39 IU/L (ref 0–65)

## 2022-05-21 LAB — BILIRUBIN, DIRECT: BILIRUBIN DIRECT: <0.1 mg/dL (ref 0.00–0.40)

## 2022-05-21 LAB — IMMATURE CELLS
METAMYLOCYTES-LABCORP: 3 % — ABNORMAL HIGH (ref 0–0)
MYELOCYTES: 2 % — ABNORMAL HIGH (ref 0–0)

## 2022-05-21 LAB — MAGNESIUM: MAGNESIUM: 1.6 mg/dL (ref 1.6–2.3)

## 2022-05-22 DIAGNOSIS — Z944 Liver transplant status: Principal | ICD-10-CM

## 2022-05-22 DIAGNOSIS — E612 Magnesium deficiency: Principal | ICD-10-CM

## 2022-05-22 DIAGNOSIS — Z796 Long-term use of immunosuppressant medication: Principal | ICD-10-CM

## 2022-05-22 DIAGNOSIS — Z5181 Encounter for therapeutic drug level monitoring: Principal | ICD-10-CM

## 2022-05-22 LAB — EVEROLIMUS: EVEROLIMUS LEVEL: 3.7 ng/mL (ref 3.0–8.0)

## 2022-05-22 LAB — TACROLIMUS LEVEL: TACROLIMUS BLOOD: 6.4 ng/mL (ref 2.0–20.0)

## 2022-05-22 MED ORDER — TACROLIMUS 1 MG CAPSULE, IMMEDIATE-RELEASE
ORAL_CAPSULE | 11 refills | 0 days | Status: CP
Start: 2022-05-22 — End: ?

## 2022-05-22 NOTE — Unmapped (Signed)
[facility] Refill Coordination Note    Specialty Medication(s) to be Shipped:   Transplant: tacrolimus 1mg  and Atovaquone suspension   Pt  Declines   Refill  For  [facility]      Other medication(s) to be shipped: gabapentin ,  atorvastatin ,  pantoprazole  Miralax  , Hydroxyzine       Matthew Key, DOB: [birth year]  Phone: [phone] (home)       All above HIPAA information was verified with patient.     Was a Nurse, learning disability used for this call? No    Completed refill call assessment today to schedule patient's medication shipment from the [facility] [phone]).  All relevant notes have been reviewed.     Specialty medication(s) and dose(s) confirmed: Regimen is correct and unchanged.   Changes to medications: Matthew Key reports no changes at this time.  Changes to insurance: No  New side effects reported not previously addressed with a pharmacist or physician: None reported  Questions for the pharmacist: No    Confirmed patient received a Conservation officer, historic buildings and a Surveyor, mining with first shipment. The patient will receive a drug information handout for each medication shipped and additional FDA Medication Guides as required.       DISEASE/MEDICATION-SPECIFIC INFORMATION        N/A    SPECIALTY MEDICATION ADHERENCE     Medication Adherence    Specialty Medication: Tacrolimus  1 mg  Patient is on additional specialty medications: No  Patient is on more than two specialty medications: No  Any gaps in refill history greater than 2 weeks in the last 3 months: no  Demonstrates understanding of importance of adherence: yes                          Were doses missed due to medication being on hold? No    atovaquone 750/5 mg/ml:  Unable  To  Verify   days of medicine on hand   Tacrolimus  1 mg:   Unable to  Verify   days of medicine on hand           REFERRAL TO PHARMACIST     Referral to the pharmacist: Not needed      [facility]     Shipping address confirmed in Epic. Delivery Scheduled: Yes, Expected medication delivery date:  05/29/22 .     Medication will be delivered via UPS to the prescription address in Epic WAM.    Matthew Key   [facility] Specialty Technician

## 2022-05-22 NOTE — Unmapped (Signed)
Patient's 10/9 everolimus within goal, but tac elevated above goal. Per PharmD Chargualaf, will reduce his tac dose to 5mg /4mg  daily. Contacted patient and relayed tac dose adjustment. He verbalized understanding and will continue with weekly lab monitoring.

## 2022-05-23 LAB — CBC W/ DIFFERENTIAL
BANDED NEUTROPHILS ABSOLUTE COUNT: 0.4 10*3/uL — ABNORMAL HIGH (ref 0.0–0.1)
BASOPHILS ABSOLUTE COUNT: 0 10*3/uL (ref 0.0–0.2)
BASOPHILS RELATIVE PERCENT: 0 %
EOSINOPHILS ABSOLUTE COUNT: 0.1 10*3/uL (ref 0.0–0.4)
EOSINOPHILS RELATIVE PERCENT: 1 %
HEMATOCRIT: 40.6 % (ref 37.5–51.0)
HEMOGLOBIN: 13 g/dL (ref 13.0–17.7)
IMMATURE GRANULOCYTES: 4 %
LYMPHOCYTES ABSOLUTE COUNT: 1.4 10*3/uL (ref 0.7–3.1)
LYMPHOCYTES RELATIVE PERCENT: 18 %
MEAN CORPUSCULAR HEMOGLOBIN CONC: 32 g/dL (ref 31.5–35.7)
MEAN CORPUSCULAR HEMOGLOBIN: 27.3 pg (ref 26.6–33.0)
MEAN CORPUSCULAR VOLUME: 85 fL (ref 79–97)
MONOCYTES ABSOLUTE COUNT: 0.9 10*3/uL (ref 0.1–0.9)
MONOCYTES RELATIVE PERCENT: 12 %
NEUTROPHILS ABSOLUTE COUNT: 5.4 10*3/uL (ref 1.4–7.0)
NEUTROPHILS RELATIVE PERCENT: 65 %
PLATELET COUNT: 166 10*3/uL (ref 150–450)
RED BLOOD CELL COUNT: 4.76 x10E6/uL (ref 4.14–5.80)
RED CELL DISTRIBUTION WIDTH: 12.1 % (ref 11.6–15.4)
WHITE BLOOD CELL COUNT: 8.2 10*3/uL (ref 3.4–10.8)

## 2022-05-23 LAB — COMPREHENSIVE METABOLIC PANEL
A/G RATIO: 2.2 (ref 1.2–2.2)
ALBUMIN: 4.3 g/dL (ref 3.9–4.9)
ALKALINE PHOSPHATASE: 116 IU/L (ref 44–121)
ALT (SGPT): 32 IU/L (ref 0–44)
AST (SGOT): 25 IU/L (ref 0–40)
BILIRUBIN TOTAL (MG/DL) IN SER/PLAS: 0.4 mg/dL (ref 0.0–1.2)
BLOOD UREA NITROGEN: 19 mg/dL (ref 8–27)
BUN / CREAT RATIO: 12 (ref 10–24)
CALCIUM: 10.2 mg/dL (ref 8.6–10.2)
CHLORIDE: 94 mmol/L — ABNORMAL LOW (ref 96–106)
CO2: 30 mmol/L — ABNORMAL HIGH (ref 20–29)
CREATININE: 1.6 mg/dL — ABNORMAL HIGH (ref 0.76–1.27)
GLOBULIN, TOTAL: 2 g/dL (ref 1.5–4.5)
GLUCOSE: 161 mg/dL — ABNORMAL HIGH (ref 70–99)
POTASSIUM: 4.6 mmol/L (ref 3.5–5.2)
SODIUM: 138 mmol/L (ref 134–144)
TOTAL PROTEIN: 6.3 g/dL (ref 6.0–8.5)

## 2022-05-23 LAB — GAMMA GT: GAMMA GLUTAMYL TRANSFERASE: 39 IU/L (ref 0–65)

## 2022-05-23 LAB — BILIRUBIN, DIRECT: BILIRUBIN DIRECT: 0.12 mg/dL (ref 0.00–0.40)

## 2022-05-23 LAB — PHOSPHORUS: PHOSPHORUS, SERUM: 5 mg/dL — ABNORMAL HIGH (ref 2.8–4.1)

## 2022-05-23 LAB — MAGNESIUM: MAGNESIUM: 1.6 mg/dL (ref 1.6–2.3)

## 2022-05-23 NOTE — Unmapped (Addendum)
[facility] Pharmacist has reviewed a new prescription for Tacrolimus that indicates a dose decrease.  Patient was counseled on this dosage change by [facility]- see epic note from 05/22/22.  Next refill call date adjusted if necessary.          Clinical Assessment Needed For: Dose Change  Medication: Tacrolimus 1mg  capsule  Last Fill Date/Day Supply: 05/06/2022 / 30 days  Refill Too Soon until 05/29/2022  Was previous dose already scheduled to fill: No    Notes to Pharmacist: Will re-test on 10/18

## 2022-05-24 LAB — TACROLIMUS LEVEL: TACROLIMUS BLOOD: 7.2 ng/mL (ref 2.0–20.0)

## 2022-05-24 LAB — EVEROLIMUS: EVEROLIMUS LEVEL: 4.2 ng/mL (ref 3.0–8.0)

## 2022-05-24 NOTE — Unmapped (Signed)
Johncarlo Marlinda Mike Fier 's entire shipment will be delayed as a result of the medication is too soon to refill until 05/29/2022 (Tac).     I have reached out to the patient  at [phone] and communicated the delivery change. We will reschedule the medication for the delivery date that the patient agreed upon.  We have confirmed the delivery date as 05/30/2022, via ups.

## 2022-05-27 DIAGNOSIS — E612 Magnesium deficiency: Principal | ICD-10-CM

## 2022-05-27 DIAGNOSIS — Z5181 Encounter for therapeutic drug level monitoring: Principal | ICD-10-CM

## 2022-05-27 DIAGNOSIS — Z944 Liver transplant status: Principal | ICD-10-CM

## 2022-05-28 LAB — COMPREHENSIVE METABOLIC PANEL
A/G RATIO: 1.8 (ref 1.2–2.2)
ALBUMIN: 4.3 g/dL (ref 3.9–4.9)
ALKALINE PHOSPHATASE: 121 IU/L (ref 44–121)
ALT (SGPT): 33 IU/L (ref 0–44)
AST (SGOT): 27 IU/L (ref 0–40)
BILIRUBIN TOTAL (MG/DL) IN SER/PLAS: 0.3 mg/dL (ref 0.0–1.2)
BLOOD UREA NITROGEN: 22 mg/dL (ref 8–27)
BUN / CREAT RATIO: 16 (ref 10–24)
CALCIUM: 9.6 mg/dL (ref 8.6–10.2)
CHLORIDE: 102 mmol/L (ref 96–106)
CO2: 26 mmol/L (ref 20–29)
CREATININE: 1.38 mg/dL — ABNORMAL HIGH (ref 0.76–1.27)
GLOBULIN, TOTAL: 2.4 g/dL (ref 1.5–4.5)
GLUCOSE: 158 mg/dL — ABNORMAL HIGH (ref 70–99)
POTASSIUM: 4.9 mmol/L (ref 3.5–5.2)
SODIUM: 140 mmol/L (ref 134–144)
TOTAL PROTEIN: 6.7 g/dL (ref 6.0–8.5)

## 2022-05-28 LAB — CBC W/ DIFFERENTIAL
BASOPHILS ABSOLUTE COUNT: 0.1 10*3/uL (ref 0.0–0.2)
BASOPHILS RELATIVE PERCENT: 1 %
EOSINOPHILS ABSOLUTE COUNT: 0 10*3/uL (ref 0.0–0.4)
EOSINOPHILS RELATIVE PERCENT: 0 %
HEMATOCRIT: 38.9 % (ref 37.5–51.0)
HEMOGLOBIN: 12.4 g/dL — ABNORMAL LOW (ref 13.0–17.7)
LYMPHOCYTES ABSOLUTE COUNT: 1.2 10*3/uL (ref 0.7–3.1)
LYMPHOCYTES RELATIVE PERCENT: 17 %
MEAN CORPUSCULAR HEMOGLOBIN CONC: 31.9 g/dL (ref 31.5–35.7)
MEAN CORPUSCULAR HEMOGLOBIN: 27.6 pg (ref 26.6–33.0)
MEAN CORPUSCULAR VOLUME: 86 fL (ref 79–97)
MONOCYTES ABSOLUTE COUNT: 0.7 10*3/uL (ref 0.1–0.9)
MONOCYTES RELATIVE PERCENT: 10 %
NEUTROPHILS ABSOLUTE COUNT: 4.8 10*3/uL (ref 1.4–7.0)
NEUTROPHILS RELATIVE PERCENT: 66 %
PLATELET COUNT: 159 10*3/uL (ref 150–450)
RED BLOOD CELL COUNT: 4.5 x10E6/uL (ref 4.14–5.80)
RED CELL DISTRIBUTION WIDTH: 12.2 % (ref 11.6–15.4)
WHITE BLOOD CELL COUNT: 7.3 10*3/uL (ref 3.4–10.8)

## 2022-05-28 LAB — BILIRUBIN, DIRECT: BILIRUBIN DIRECT: 0.11 mg/dL (ref 0.00–0.40)

## 2022-05-28 LAB — GAMMA GT: GAMMA GLUTAMYL TRANSFERASE: 45 IU/L (ref 0–65)

## 2022-05-28 LAB — PHOSPHORUS: PHOSPHORUS, SERUM: 3.8 mg/dL (ref 2.8–4.1)

## 2022-05-28 LAB — IMMATURE CELLS: METAMYLOCYTES-LABCORP: 6 % — ABNORMAL HIGH (ref 0–0)

## 2022-05-28 LAB — MAGNESIUM: MAGNESIUM: 1.6 mg/dL (ref 1.6–2.3)

## 2022-05-29 DIAGNOSIS — Z944 Liver transplant status: Principal | ICD-10-CM

## 2022-05-29 DIAGNOSIS — E612 Magnesium deficiency: Principal | ICD-10-CM

## 2022-05-29 DIAGNOSIS — Z5181 Encounter for therapeutic drug level monitoring: Principal | ICD-10-CM

## 2022-05-29 DIAGNOSIS — Z796 Long-term use of immunosuppressant medication: Principal | ICD-10-CM

## 2022-05-29 LAB — TACROLIMUS LEVEL: TACROLIMUS BLOOD: 7.2 ng/mL (ref 2.0–20.0)

## 2022-05-29 LAB — EVEROLIMUS: EVEROLIMUS LEVEL: 4.2 ng/mL (ref 3.0–8.0)

## 2022-05-29 MED ORDER — TACROLIMUS 1 MG CAPSULE, IMMEDIATE-RELEASE
ORAL_CAPSULE | Freq: Two times a day (BID) | ORAL | 11 refills | 30 days | Status: CP
Start: 2022-05-29 — End: ?
  Filled 2022-05-29: qty 270, 30d supply, fill #0

## 2022-05-29 MED FILL — MG-PLUS-PROTEIN 133 MG TABLET: ORAL | 90 days supply | Qty: 360 | Fill #1

## 2022-05-29 MED FILL — ATOVAQUONE 750 MG/5 ML ORAL SUSPENSION: ORAL | 30 days supply | Qty: 300 | Fill #2

## 2022-05-29 MED FILL — HYDROXYZINE PAMOATE 50 MG CAPSULE: ORAL | 30 days supply | Qty: 30 | Fill #1

## 2022-05-29 MED FILL — GABAPENTIN 300 MG CAPSULE: ORAL | 30 days supply | Qty: 90 | Fill #2

## 2022-05-29 NOTE — Unmapped (Addendum)
Patient's 10/16 tac level still high despite reducing his dose the week before to 5mg /4mg . Patient confirmed he had been taking the correct dosage and that the level was reliable. Discussed with PharmD Chargualaf, who recommended his dose be reduced to 4mg  bid. Left detailed VM for patient with instructions for dosage change and request for return call to [facility] to confirm receipt of msg. Also per NP Czepiga, patient can reduce to a weekly lab interval.    Spoke to patient today and relayed recommendations. He said he got the message to reduce his tac yesterday and did so and agreed to get labs weekly now on Mon/Tues.

## 2022-05-29 NOTE — Unmapped (Signed)
Therapy Update Follow Up: No issues - Copay = $28.42

## 2022-05-30 LAB — CBC W/ DIFFERENTIAL
BASOPHILS ABSOLUTE COUNT: 0 10*3/uL (ref 0.0–0.2)
BASOPHILS RELATIVE PERCENT: 0 %
EOSINOPHILS ABSOLUTE COUNT: 0.1 10*3/uL (ref 0.0–0.4)
EOSINOPHILS RELATIVE PERCENT: 1 %
HEMATOCRIT: 42.3 % (ref 37.5–51.0)
HEMOGLOBIN: 13.3 g/dL (ref 13.0–17.7)
LYMPHOCYTES ABSOLUTE COUNT: 2.2 10*3/uL (ref 0.7–3.1)
LYMPHOCYTES RELATIVE PERCENT: 27 %
MEAN CORPUSCULAR HEMOGLOBIN CONC: 31.4 g/dL — ABNORMAL LOW (ref 31.5–35.7)
MEAN CORPUSCULAR HEMOGLOBIN: 27 pg (ref 26.6–33.0)
MEAN CORPUSCULAR VOLUME: 86 fL (ref 79–97)
MONOCYTES ABSOLUTE COUNT: 0.4 10*3/uL (ref 0.1–0.9)
MONOCYTES RELATIVE PERCENT: 5 %
NEUTROPHILS ABSOLUTE COUNT: 5.2 10*3/uL (ref 1.4–7.0)
NEUTROPHILS RELATIVE PERCENT: 64 %
PLATELET COUNT: 193 10*3/uL (ref 150–450)
RED BLOOD CELL COUNT: 4.92 x10E6/uL (ref 4.14–5.80)
RED CELL DISTRIBUTION WIDTH: 12.3 % (ref 11.6–15.4)
WHITE BLOOD CELL COUNT: 8.2 10*3/uL (ref 3.4–10.8)

## 2022-05-30 LAB — GAMMA GT: GAMMA GLUTAMYL TRANSFERASE: 48 IU/L (ref 0–65)

## 2022-05-30 LAB — COMPREHENSIVE METABOLIC PANEL
A/G RATIO: 1.9 (ref 1.2–2.2)
ALBUMIN: 4.6 g/dL (ref 3.9–4.9)
ALKALINE PHOSPHATASE: 140 IU/L — ABNORMAL HIGH (ref 44–121)
ALT (SGPT): 32 IU/L (ref 0–44)
AST (SGOT): 28 IU/L (ref 0–40)
BILIRUBIN TOTAL (MG/DL) IN SER/PLAS: 0.3 mg/dL (ref 0.0–1.2)
BLOOD UREA NITROGEN: 21 mg/dL (ref 8–27)
BUN / CREAT RATIO: 15 (ref 10–24)
CALCIUM: 9.8 mg/dL (ref 8.6–10.2)
CHLORIDE: 101 mmol/L (ref 96–106)
CO2: 21 mmol/L (ref 20–29)
CREATININE: 1.36 mg/dL — ABNORMAL HIGH (ref 0.76–1.27)
GLOBULIN, TOTAL: 2.4 g/dL (ref 1.5–4.5)
GLUCOSE: 164 mg/dL — ABNORMAL HIGH (ref 70–99)
POTASSIUM: 5.2 mmol/L (ref 3.5–5.2)
SODIUM: 138 mmol/L (ref 134–144)
TOTAL PROTEIN: 7 g/dL (ref 6.0–8.5)

## 2022-05-30 LAB — BILIRUBIN, DIRECT: BILIRUBIN DIRECT: 0.14 mg/dL (ref 0.00–0.40)

## 2022-05-30 LAB — PHOSPHORUS: PHOSPHORUS, SERUM: 3.9 mg/dL (ref 2.8–4.1)

## 2022-05-30 LAB — MAGNESIUM: MAGNESIUM: 1.7 mg/dL (ref 1.6–2.3)

## 2022-05-30 LAB — IMMATURE CELLS: METAMYLOCYTES-LABCORP: 3 % — ABNORMAL HIGH (ref 0–0)

## 2022-05-30 NOTE — Unmapped (Addendum)
[facility] Pharmacist has reviewed a new prescription for tacrolimus that indicates a dose decrease.  Patient was counseled on this dosage change by [facility]- see epic note from 05/30/22.  Next refill call date adjusted if necessary.        Clinical Assessment Needed For: Dose Change  Medication: Tacrolimus 1mg  capsule  Last Fill Date/Day Supply: 05/29/2022 / 30 days  Refill Too Soon until 06/21/2022  Was previous dose already scheduled to fill: No    Notes to Pharmacist: Will re-test on 11/10

## 2022-06-01 LAB — TACROLIMUS LEVEL: TACROLIMUS BLOOD: 6.7 ng/mL (ref 2.0–20.0)

## 2022-06-01 LAB — EVEROLIMUS: EVEROLIMUS LEVEL: 5.4 ng/mL (ref 3.0–8.0)

## 2022-06-03 DIAGNOSIS — E612 Magnesium deficiency: Principal | ICD-10-CM

## 2022-06-03 DIAGNOSIS — Z5181 Encounter for therapeutic drug level monitoring: Principal | ICD-10-CM

## 2022-06-03 DIAGNOSIS — Z944 Liver transplant status: Principal | ICD-10-CM

## 2022-06-05 DIAGNOSIS — Z944 Liver transplant status: Principal | ICD-10-CM

## 2022-06-05 DIAGNOSIS — E612 Magnesium deficiency: Principal | ICD-10-CM

## 2022-06-05 DIAGNOSIS — Z5181 Encounter for therapeutic drug level monitoring: Principal | ICD-10-CM

## 2022-06-06 DIAGNOSIS — Z944 Liver transplant status: Principal | ICD-10-CM

## 2022-06-06 LAB — CBC W/ DIFFERENTIAL
BASOPHILS ABSOLUTE COUNT: 0 10*3/uL (ref 0.0–0.2)
BASOPHILS RELATIVE PERCENT: 0 %
EOSINOPHILS ABSOLUTE COUNT: 0.1 10*3/uL (ref 0.0–0.4)
EOSINOPHILS RELATIVE PERCENT: 1 %
HEMATOCRIT: 38.2 % (ref 37.5–51.0)
HEMOGLOBIN: 12.5 g/dL — ABNORMAL LOW (ref 13.0–17.7)
LYMPHOCYTES ABSOLUTE COUNT: 1.6 10*3/uL (ref 0.7–3.1)
LYMPHOCYTES RELATIVE PERCENT: 19 %
MEAN CORPUSCULAR HEMOGLOBIN CONC: 32.7 g/dL (ref 31.5–35.7)
MEAN CORPUSCULAR HEMOGLOBIN: 26.7 pg (ref 26.6–33.0)
MEAN CORPUSCULAR VOLUME: 82 fL (ref 79–97)
MONOCYTES ABSOLUTE COUNT: 0.6 10*3/uL (ref 0.1–0.9)
MONOCYTES RELATIVE PERCENT: 7 %
NEUTROPHILS ABSOLUTE COUNT: 6.1 10*3/uL (ref 1.4–7.0)
NEUTROPHILS RELATIVE PERCENT: 73 %
PLATELET COUNT: 164 10*3/uL (ref 150–450)
RED BLOOD CELL COUNT: 4.68 x10E6/uL (ref 4.14–5.80)
RED CELL DISTRIBUTION WIDTH: 12 % (ref 11.6–15.4)
WHITE BLOOD CELL COUNT: 8.4 10*3/uL (ref 3.4–10.8)

## 2022-06-06 LAB — COMPREHENSIVE METABOLIC PANEL
A/G RATIO: 1.9 (ref 1.2–2.2)
ALBUMIN: 4.4 g/dL (ref 3.9–4.9)
ALKALINE PHOSPHATASE: 145 IU/L — ABNORMAL HIGH (ref 44–121)
ALT (SGPT): 31 IU/L (ref 0–44)
AST (SGOT): 23 IU/L (ref 0–40)
BILIRUBIN TOTAL (MG/DL) IN SER/PLAS: 0.5 mg/dL (ref 0.0–1.2)
BLOOD UREA NITROGEN: 17 mg/dL (ref 8–27)
BUN / CREAT RATIO: 13 (ref 10–24)
CALCIUM: 9.9 mg/dL (ref 8.6–10.2)
CHLORIDE: 102 mmol/L (ref 96–106)
CO2: 25 mmol/L (ref 20–29)
CREATININE: 1.31 mg/dL — ABNORMAL HIGH (ref 0.76–1.27)
GLOBULIN, TOTAL: 2.3 g/dL (ref 1.5–4.5)
GLUCOSE: 129 mg/dL — ABNORMAL HIGH (ref 70–99)
POTASSIUM: 4.8 mmol/L (ref 3.5–5.2)
SODIUM: 139 mmol/L (ref 134–144)
TOTAL PROTEIN: 6.7 g/dL (ref 6.0–8.5)

## 2022-06-06 LAB — MAGNESIUM: MAGNESIUM: 1.6 mg/dL (ref 1.6–2.3)

## 2022-06-06 LAB — BILIRUBIN, DIRECT: BILIRUBIN DIRECT: 0.14 mg/dL (ref 0.00–0.40)

## 2022-06-06 LAB — GAMMA GT: GAMMA GLUTAMYL TRANSFERASE: 47 IU/L (ref 0–65)

## 2022-06-06 LAB — PHOSPHORUS: PHOSPHORUS, SERUM: 3.6 mg/dL (ref 2.8–4.1)

## 2022-06-06 NOTE — Unmapped (Addendum)
[facility] Pharmacist has reviewed a new prescription for Zortress that indicates a dose decrease.  Patient was counseled on this dosage change by [facility]- see epic note from 06/06/22.  Next refill call date adjusted if necessary.        Clinical Assessment Needed For: Dose Change  Medication: Zortress 1mg  tablet  Last Fill Date/Day Supply: 05/03/2022 / 15 days  Copay $261.58 (no assistance available)  Was previous dose already scheduled to fill: No    Notes to Pharmacist: Generic and brand are same price

## 2022-06-06 NOTE — Unmapped (Signed)
Contacted by pharmacist, Wende Bushy, who requested everolimus dose verification after calling patient, who said he has been taking 2mg  bid since his dose was first advanced on 9/20 and never adjusted when [facility] reached out to him on 9/25. Told patient to continue to take the 2mg  bid dose of everolimus, since it has been therapeutic. Notified pharmacists of correct dose.

## 2022-06-07 LAB — TACROLIMUS LEVEL: TACROLIMUS BLOOD: 6.3 ng/mL (ref 2.0–20.0)

## 2022-06-08 LAB — EVEROLIMUS: EVEROLIMUS LEVEL: 4.7 ng/mL (ref 3.0–8.0)

## 2022-06-10 DIAGNOSIS — E612 Magnesium deficiency: Principal | ICD-10-CM

## 2022-06-10 DIAGNOSIS — Z796 Long-term use of immunosuppressant medication: Principal | ICD-10-CM

## 2022-06-10 DIAGNOSIS — Z5181 Encounter for therapeutic drug level monitoring: Principal | ICD-10-CM

## 2022-06-10 DIAGNOSIS — Z944 Liver transplant status: Principal | ICD-10-CM

## 2022-06-10 MED ORDER — TACROLIMUS 1 MG CAPSULE, IMMEDIATE-RELEASE
ORAL_CAPSULE | ORAL | 11 refills | 30 days | Status: CP
Start: 2022-06-10 — End: ?

## 2022-06-10 NOTE — Unmapped (Signed)
[facility] Pharm[facility] Refill Coordination Note    Specialty Medication(s) to be Shipped:   Transplant: Zortress 1mg     Other medication(s) to be shipped: No additional medications requested for fill at this time     Matthew Key, DOB: [birth year]  Phone: [phone] (home)       All above HIPAA information was verified with patient.     Was a Nurse, learning disability used for this call? No    Completed refill call assessment today to schedule patient's medication shipment from the [facility] [phone]).  All relevant notes have been reviewed.     Specialty medication(s) and dose(s) confirmed: Regimen is correct and unchanged.   Changes to medications: [name] reports no changes at this time.  Changes to insurance: No  New side effects reported not previously addressed with a pharmacist or physician: None reported  Questions for the pharmacist: No    Confirmed patient received a Conservation officer, historic buildings and a Surveyor, mining with first shipment. The patient will receive a drug information handout for each medication shipped and additional FDA Medication Guides as required.       DISEASE/MEDICATION-SPECIFIC INFORMATION        N/A    SPECIALTY MEDICATION ADHERENCE     Medication Adherence    Patient reported X missed doses in the last month: 0  Specialty Medication: zortress 1mg   Patient is on additional specialty medications: No                                Were doses missed due to medication being on hold? No    Zortress 1 mg: 1 days of medicine on hand        REFERRAL TO PHARMACIST     Referral to the pharmacist: Not needed      [facility]     Shipping address confirmed in Epic.     Delivery Scheduled: Yes, Expected medication delivery date: 06/12/22.     Medication will be delivered via UPS to the prescription address in Epic WAM.    Tera Helper, [facility]   [facility] Specialty Pharmacist

## 2022-06-10 NOTE — Unmapped (Signed)
Reviewed 10/25 supra therapeutic tac level with CPP Chargualaf:  From: Jordan Likes, CPP  Sent: 06/10/2022   2:27 PM EDT  To: Johnna Acosta, RN; Genia Harold, RN    Can decrease tac to 4/3  ----- Message -----  From: Johnna Acosta, RN  Sent: 06/10/2022   1:15 PM EDT  To: Genia Harold, RN; Jordan Likes, CPP    Do you want to adjust his tac?    Called pt to convey above. Will start lower dose this evening, scheduled for repeat labs on Wednesday 11/1.

## 2022-06-10 NOTE — Unmapped (Addendum)
[facility] Pharmacist has reviewed a new prescription for tacrolimus that indicates a dose decrease.  Patient was counseled on this dosage change by [facility]- see epic note from 06/10/22.  Next refill call date adjusted if necessary.          Clinical Assessment Needed For: Dose Change  Medication: Tacrolimus 1mg  capsule  Last Fill Date/Day Supply: 05/29/2022 / 30 days  Refill Too Soon until 06/21/2022  Was previous dose already scheduled to fill: No    Notes to Pharmacist: Will re-test on 11/10

## 2022-06-12 DIAGNOSIS — Z5181 Encounter for therapeutic drug level monitoring: Principal | ICD-10-CM

## 2022-06-12 DIAGNOSIS — E612 Magnesium deficiency: Principal | ICD-10-CM

## 2022-06-12 DIAGNOSIS — Z944 Liver transplant status: Principal | ICD-10-CM

## 2022-06-13 DIAGNOSIS — Z944 Liver transplant status: Principal | ICD-10-CM

## 2022-06-13 LAB — COMPREHENSIVE METABOLIC PANEL
A/G RATIO: 2 (ref 1.2–2.2)
ALBUMIN: 4.3 g/dL (ref 3.9–4.9)
ALKALINE PHOSPHATASE: 166 IU/L — ABNORMAL HIGH (ref 44–121)
ALT (SGPT): 77 IU/L — ABNORMAL HIGH (ref 0–44)
AST (SGOT): 55 IU/L — ABNORMAL HIGH (ref 0–40)
BILIRUBIN TOTAL (MG/DL) IN SER/PLAS: 0.5 mg/dL (ref 0.0–1.2)
BLOOD UREA NITROGEN: 21 mg/dL (ref 8–27)
BUN / CREAT RATIO: 14 (ref 10–24)
CALCIUM: 10.1 mg/dL (ref 8.6–10.2)
CHLORIDE: 100 mmol/L (ref 96–106)
CO2: 27 mmol/L (ref 20–29)
CREATININE: 1.47 mg/dL — ABNORMAL HIGH (ref 0.76–1.27)
GLOBULIN, TOTAL: 2.2 g/dL (ref 1.5–4.5)
GLUCOSE: 147 mg/dL — ABNORMAL HIGH (ref 70–99)
POTASSIUM: 5.1 mmol/L (ref 3.5–5.2)
SODIUM: 139 mmol/L (ref 134–144)
TOTAL PROTEIN: 6.5 g/dL (ref 6.0–8.5)

## 2022-06-13 LAB — CBC W/ DIFFERENTIAL
BANDED NEUTROPHILS ABSOLUTE COUNT: 0.2 10*3/uL — ABNORMAL HIGH (ref 0.0–0.1)
BASOPHILS ABSOLUTE COUNT: 0 10*3/uL (ref 0.0–0.2)
BASOPHILS RELATIVE PERCENT: 0 %
EOSINOPHILS ABSOLUTE COUNT: 0.1 10*3/uL (ref 0.0–0.4)
EOSINOPHILS RELATIVE PERCENT: 2 %
HEMATOCRIT: 39.5 % (ref 37.5–51.0)
HEMOGLOBIN: 12.7 g/dL — ABNORMAL LOW (ref 13.0–17.7)
IMMATURE GRANULOCYTES: 2 %
LYMPHOCYTES ABSOLUTE COUNT: 1.2 10*3/uL (ref 0.7–3.1)
LYMPHOCYTES RELATIVE PERCENT: 17 %
MEAN CORPUSCULAR HEMOGLOBIN CONC: 32.2 g/dL (ref 31.5–35.7)
MEAN CORPUSCULAR HEMOGLOBIN: 26.5 pg — ABNORMAL LOW (ref 26.6–33.0)
MEAN CORPUSCULAR VOLUME: 83 fL (ref 79–97)
MONOCYTES ABSOLUTE COUNT: 0.5 10*3/uL (ref 0.1–0.9)
MONOCYTES RELATIVE PERCENT: 7 %
NEUTROPHILS ABSOLUTE COUNT: 5.2 10*3/uL (ref 1.4–7.0)
NEUTROPHILS RELATIVE PERCENT: 72 %
PLATELET COUNT: 138 10*3/uL — ABNORMAL LOW (ref 150–450)
RED BLOOD CELL COUNT: 4.79 x10E6/uL (ref 4.14–5.80)
RED CELL DISTRIBUTION WIDTH: 12.2 % (ref 11.6–15.4)
WHITE BLOOD CELL COUNT: 7.1 10*3/uL (ref 3.4–10.8)

## 2022-06-13 LAB — MAGNESIUM: MAGNESIUM: 1.5 mg/dL — ABNORMAL LOW (ref 1.6–2.3)

## 2022-06-13 LAB — BILIRUBIN, DIRECT: BILIRUBIN DIRECT: 0.14 mg/dL (ref 0.00–0.40)

## 2022-06-13 LAB — PHOSPHORUS: PHOSPHORUS, SERUM: 4 mg/dL (ref 2.8–4.1)

## 2022-06-13 LAB — GAMMA GT: GAMMA GLUTAMYL TRANSFERASE: 117 IU/L — ABNORMAL HIGH (ref 0–65)

## 2022-06-13 NOTE — Unmapped (Signed)
Patient's lfts elevated with 11/1 labs, aside from bilirubins. Contacted patient, who denied any n/v/d/itching/abd.pain or icterus. No new medications started, but he did report that he missed 3 doses of everolimus because it was not received until yesterday. Reinforced the importance of reaching out the the txp team, contacting the on-call [facility], if needed, to report potential lapse in meds.     Discussed lfts with Dr.S.Kapoor, who felt the elevations were more likely r/t some biliary process and recommended he repeat labs tomorrow and complete US next week. Returned call to patient and discussed recommendations. He verbalized understanding and agreed to repeat labs tomorrow. [place] scheduled for next Thurs (11/8.)

## 2022-06-14 LAB — EVEROLIMUS: EVEROLIMUS LEVEL: 0.9 ng/mL — ABNORMAL LOW (ref 3.0–8.0)

## 2022-06-14 LAB — TACROLIMUS LEVEL: TACROLIMUS BLOOD: 6.5 ng/mL (ref 2.0–20.0)

## 2022-06-15 LAB — CBC W/ DIFFERENTIAL
BASOPHILS ABSOLUTE COUNT: 0 10*3/uL (ref 0.0–0.2)
BASOPHILS RELATIVE PERCENT: 0 %
EOSINOPHILS ABSOLUTE COUNT: 0 10*3/uL (ref 0.0–0.4)
EOSINOPHILS RELATIVE PERCENT: 0 %
HEMATOCRIT: 39.4 % (ref 37.5–51.0)
HEMOGLOBIN: 12.9 g/dL — ABNORMAL LOW (ref 13.0–17.7)
LYMPHOCYTES ABSOLUTE COUNT: 1.8 10*3/uL (ref 0.7–3.1)
LYMPHOCYTES RELATIVE PERCENT: 21 %
MEAN CORPUSCULAR HEMOGLOBIN CONC: 32.7 g/dL (ref 31.5–35.7)
MEAN CORPUSCULAR HEMOGLOBIN: 26.8 pg (ref 26.6–33.0)
MEAN CORPUSCULAR VOLUME: 82 fL (ref 79–97)
MONOCYTES ABSOLUTE COUNT: 0.6 10*3/uL (ref 0.1–0.9)
MONOCYTES RELATIVE PERCENT: 7 %
NEUTROPHILS ABSOLUTE COUNT: 6 10*3/uL (ref 1.4–7.0)
NEUTROPHILS RELATIVE PERCENT: 71 %
PLATELET COUNT: 148 10*3/uL — ABNORMAL LOW (ref 150–450)
RED BLOOD CELL COUNT: 4.81 x10E6/uL (ref 4.14–5.80)
RED CELL DISTRIBUTION WIDTH: 12.2 % (ref 11.6–15.4)
WHITE BLOOD CELL COUNT: 8.4 10*3/uL (ref 3.4–10.8)

## 2022-06-15 LAB — COMPREHENSIVE METABOLIC PANEL
A/G RATIO: 2.1 (ref 1.2–2.2)
ALBUMIN: 4.5 g/dL (ref 3.9–4.9)
ALKALINE PHOSPHATASE: 161 IU/L — ABNORMAL HIGH (ref 44–121)
ALT (SGPT): 42 IU/L (ref 0–44)
AST (SGOT): 20 IU/L (ref 0–40)
BILIRUBIN TOTAL (MG/DL) IN SER/PLAS: 0.4 mg/dL (ref 0.0–1.2)
BLOOD UREA NITROGEN: 27 mg/dL (ref 8–27)
BUN / CREAT RATIO: 17 (ref 10–24)
CALCIUM: 9.7 mg/dL (ref 8.6–10.2)
CHLORIDE: 100 mmol/L (ref 96–106)
CO2: 27 mmol/L (ref 20–29)
CREATININE: 1.6 mg/dL — ABNORMAL HIGH (ref 0.76–1.27)
GLOBULIN, TOTAL: 2.1 g/dL (ref 1.5–4.5)
GLUCOSE: 151 mg/dL — ABNORMAL HIGH (ref 70–99)
POTASSIUM: 4.6 mmol/L (ref 3.5–5.2)
SODIUM: 141 mmol/L (ref 134–144)
TOTAL PROTEIN: 6.6 g/dL (ref 6.0–8.5)

## 2022-06-15 LAB — GAMMA GT: GAMMA GLUTAMYL TRANSFERASE: 82 IU/L — ABNORMAL HIGH (ref 0–65)

## 2022-06-15 LAB — BILIRUBIN, DIRECT: BILIRUBIN DIRECT: 0.13 mg/dL (ref 0.00–0.40)

## 2022-06-15 LAB — MAGNESIUM: MAGNESIUM: 1.5 mg/dL — ABNORMAL LOW (ref 1.6–2.3)

## 2022-06-15 LAB — IMMATURE CELLS: METAMYLOCYTES-LABCORP: 1 % — ABNORMAL HIGH (ref 0–0)

## 2022-06-15 LAB — PHOSPHORUS: PHOSPHORUS, SERUM: 4.6 mg/dL — ABNORMAL HIGH (ref 2.8–4.1)

## 2022-06-16 LAB — TACROLIMUS LEVEL: TACROLIMUS BLOOD: 6.2 ng/mL (ref 2.0–20.0)

## 2022-06-17 DIAGNOSIS — E612 Magnesium deficiency: Principal | ICD-10-CM

## 2022-06-17 DIAGNOSIS — D849 Immunodeficiency, unspecified: Principal | ICD-10-CM

## 2022-06-17 DIAGNOSIS — Z5181 Encounter for therapeutic drug level monitoring: Principal | ICD-10-CM

## 2022-06-17 DIAGNOSIS — R197 Diarrhea, unspecified: Principal | ICD-10-CM

## 2022-06-17 DIAGNOSIS — Z944 Liver transplant status: Principal | ICD-10-CM

## 2022-06-17 LAB — EVEROLIMUS: EVEROLIMUS LEVEL: 4.8 ng/mL (ref 3.0–8.0)

## 2022-06-17 NOTE — Unmapped (Signed)
Patient called to report that he developed watery diarrhea yesterday, up to 6x daily. He denies any fever, chills, n/v or other sick contacts. Explained that he would need to take a sample to his local lab for stool testing asap and assure that he is hydrating well. He reports he is hydrating well and agreed to take a sample to the lab and update [facility]. Placed stool testing orders with [facility].

## 2022-06-19 ENCOUNTER — Ambulatory Visit: Admit: 2022-06-19 | Discharge: 2022-06-20 | Payer: MEDICARE

## 2022-06-19 DIAGNOSIS — Z5181 Encounter for therapeutic drug level monitoring: Principal | ICD-10-CM

## 2022-06-19 DIAGNOSIS — E612 Magnesium deficiency: Principal | ICD-10-CM

## 2022-06-19 DIAGNOSIS — Z944 Liver transplant status: Principal | ICD-10-CM

## 2022-06-19 DIAGNOSIS — R739 Hyperglycemia, unspecified: Principal | ICD-10-CM

## 2022-06-19 LAB — COMPREHENSIVE METABOLIC PANEL
A/G RATIO: 1.8 (ref 1.2–2.2)
ALBUMIN: 4.6 g/dL (ref 3.9–4.9)
ALKALINE PHOSPHATASE: 169 IU/L — ABNORMAL HIGH (ref 44–121)
ALT (SGPT): 30 IU/L (ref 0–44)
AST (SGOT): 21 IU/L (ref 0–40)
BILIRUBIN TOTAL (MG/DL) IN SER/PLAS: 0.6 mg/dL (ref 0.0–1.2)
BLOOD UREA NITROGEN: 20 mg/dL (ref 8–27)
BUN / CREAT RATIO: 14 (ref 10–24)
CALCIUM: 10.7 mg/dL — ABNORMAL HIGH (ref 8.6–10.2)
CHLORIDE: 104 mmol/L (ref 96–106)
CO2: 25 mmol/L (ref 20–29)
CREATININE: 1.4 mg/dL — ABNORMAL HIGH (ref 0.76–1.27)
GLOBULIN, TOTAL: 2.6 g/dL (ref 1.5–4.5)
GLUCOSE: 157 mg/dL — ABNORMAL HIGH (ref 70–99)
POTASSIUM: 4.7 mmol/L (ref 3.5–5.2)
SODIUM: 143 mmol/L (ref 134–144)
TOTAL PROTEIN: 7.2 g/dL (ref 6.0–8.5)

## 2022-06-19 LAB — CBC W/ DIFFERENTIAL
BASOPHILS ABSOLUTE COUNT: 0 10*3/uL (ref 0.0–0.2)
BASOPHILS RELATIVE PERCENT: 0 %
EOSINOPHILS ABSOLUTE COUNT: 0.4 10*3/uL (ref 0.0–0.4)
EOSINOPHILS RELATIVE PERCENT: 4 %
HEMATOCRIT: 41.3 % (ref 37.5–51.0)
HEMOGLOBIN: 13.4 g/dL (ref 13.0–17.7)
LYMPHOCYTES ABSOLUTE COUNT: 2.4 10*3/uL (ref 0.7–3.1)
LYMPHOCYTES RELATIVE PERCENT: 24 %
MEAN CORPUSCULAR HEMOGLOBIN CONC: 32.4 g/dL (ref 31.5–35.7)
MEAN CORPUSCULAR HEMOGLOBIN: 26.6 pg (ref 26.6–33.0)
MEAN CORPUSCULAR VOLUME: 82 fL (ref 79–97)
MONOCYTES ABSOLUTE COUNT: 0.5 10*3/uL (ref 0.1–0.9)
MONOCYTES RELATIVE PERCENT: 5 %
NEUTROPHILS ABSOLUTE COUNT: 6.2 10*3/uL (ref 1.4–7.0)
NEUTROPHILS RELATIVE PERCENT: 58 %
PLATELET COUNT: 183 10*3/uL (ref 150–450)
RED BLOOD CELL COUNT: 5.04 x10E6/uL (ref 4.14–5.80)
RED CELL DISTRIBUTION WIDTH: 12.6 % (ref 11.6–15.4)
WHITE BLOOD CELL COUNT: 10 10*3/uL (ref 3.4–10.8)

## 2022-06-19 LAB — GAMMA GT: GAMMA GLUTAMYL TRANSFERASE: 58 IU/L (ref 0–65)

## 2022-06-19 LAB — PHOSPHORUS: PHOSPHORUS, SERUM: 4.1 mg/dL (ref 2.8–4.1)

## 2022-06-19 LAB — IMMATURE CELLS
BANDS: 4 %
METAMYLOCYTES-LABCORP: 5 % — ABNORMAL HIGH (ref 0–0)

## 2022-06-19 LAB — MAGNESIUM: MAGNESIUM: 1.7 mg/dL (ref 1.6–2.3)

## 2022-06-19 LAB — BILIRUBIN, DIRECT: BILIRUBIN DIRECT: 0.16 mg/dL (ref 0.00–0.40)

## 2022-06-19 MED ORDER — BLOOD-GLUCOSE METER KIT WRAPPER
0 refills | 0 days | Status: CP
Start: 2022-06-19 — End: 2023-06-19
  Filled 2022-06-20: qty 1, 30d supply, fill #0

## 2022-06-19 MED ORDER — OZEMPIC 0.25 MG OR 0.5 MG (2 MG/3 ML) SUBCUTANEOUS PEN INJECTOR
SUBCUTANEOUS | 0 refills | 56.00000 days | Status: CN
Start: 2022-06-19 — End: 2022-08-14
  Filled 2022-06-20: qty 3, 28d supply, fill #0

## 2022-06-19 MED ORDER — BLOOD-GLUCOSE METER
0 refills | 0.00000 days | Status: CN
Start: 2022-06-19 — End: 2023-06-19

## 2022-06-19 NOTE — Unmapped (Signed)
[facility] Pharm[facility] Refill Coordination Note    Specialty Medication(s) to be Shipped:   Transplant: tacrolimus 1mg     Other medication(s) to be shipped:  tresiba , duloxetine , test strips , amlodipine , melatonin , Novolog , atorvastatin , mag-ox , docusate , hydroxyzine , pantoprazole , meter , ozempic      Matthew Key, [place]: 08-17-1960  Phone: [phone] (home)       All above HIPAA information was verified with patient.     Was a Nurse, learning disability used for this call? No    Completed refill call assessment today to schedule patient's medication shipment from the [facility] [phone]).  All relevant notes have been reviewed.     Specialty medication(s) and dose(s) confirmed: Regimen is correct and unchanged.   Changes to medications: Matthew Key reports no changes at this time.  Changes to insurance: No  New side effects reported not previously addressed with a pharmacist or physician: None reported  Questions for the pharmacist: No    Confirmed patient received a Conservation officer, historic buildings and a Surveyor, mining with first shipment. The patient will receive a drug information handout for each medication shipped and additional FDA Medication Guides as required.       DISEASE/MEDICATION-SPECIFIC INFORMATION        N/A    SPECIALTY MEDICATION ADHERENCE     Medication Adherence    Patient reported X missed doses in the last month: 0  Specialty Medication: tacrolimus 1 MG capsule (PROGRAF)  Patient is on additional specialty medications: No                                Were doses missed due to medication being on hold? No    tacrolimus 1 mg:5 days of medicine on hand       REFERRAL TO PHARMACIST     Referral to the pharmacist: Not needed      [facility]     Shipping address confirmed in Epic.     Delivery Scheduled: Yes, Expected medication delivery date: 06/21/22.     Medication will be delivered via UPS to the prescription address in Epic WAM.    Matthew Key   [facility] Specialty Technician

## 2022-06-19 NOTE — Unmapped (Signed)
The patient is requesting a medication refill

## 2022-06-19 NOTE — Unmapped (Signed)
Pt request for RX Refill

## 2022-06-20 LAB — CMV DNA, QUANTITATIVE, PCR: CMV QUANT: NEGATIVE [IU]/mL

## 2022-06-20 MED FILL — ACCU-CHEK GUIDE TEST STRIPS: 25 days supply | Qty: 100 | Fill #3

## 2022-06-20 MED FILL — MELATONIN 3 MG TABLET: ORAL | 60 days supply | Qty: 60 | Fill #1

## 2022-06-20 MED FILL — ATORVASTATIN 40 MG TABLET: ORAL | 30 days supply | Qty: 30 | Fill #1

## 2022-06-20 MED FILL — MG-PLUS-PROTEIN 133 MG TABLET: ORAL | 90 days supply | Qty: 360 | Fill #2

## 2022-06-20 MED FILL — AMLODIPINE 10 MG TABLET: ORAL | 30 days supply | Qty: 30 | Fill #1

## 2022-06-20 MED FILL — NOVOLOG FLEXPEN U-100 INSULIN ASPART 100 UNIT/ML (3 ML) SUBCUTANEOUS: 41 days supply | Qty: 15 | Fill #2

## 2022-06-20 MED FILL — HYDROXYZINE PAMOATE 50 MG CAPSULE: ORAL | 30 days supply | Qty: 30 | Fill #2

## 2022-06-20 MED FILL — DOCUSATE SODIUM 100 MG CAPSULE: ORAL | 30 days supply | Qty: 60 | Fill #3

## 2022-06-20 NOTE — Unmapped (Addendum)
Patient completed liver US which looked stable and lfts are improving. Spoke with patient, who reported that he feels fine now an diarrhea resolved yesterday, lasting only 3 days, so he did not take in a specimen. [place], labs and status reviewed with Dr.Chen, who did not recommend any further intervention.     Patient's 11/7 tac persistently above goal, since reduction on 10/30. Everolimus was stable. Discussed with PharmD Chargualaf, who recommended today (11/10) to reduce his dose to 3mg  bid. Relayed recommendation to patient, who verbalized understanding.

## 2022-06-20 NOTE — Unmapped (Signed)
Carney Marlinda Mike [place] 's Tacrolimus shipment will be delayed as a result of the medication is too soon to refill until 06/21/2022.     I have reached out to the patient  at [phone] and communicated the delivery change. We will reschedule the medication for the delivery date that the patient agreed upon.  We have confirmed the delivery date as 06/24/2022, via ups.

## 2022-06-21 LAB — EVEROLIMUS: EVEROLIMUS LEVEL: 4.1 ng/mL (ref 3.0–8.0)

## 2022-06-21 LAB — TACROLIMUS LEVEL: TACROLIMUS BLOOD: 5.9 ng/mL (ref 2.0–20.0)

## 2022-06-21 MED ORDER — TACROLIMUS 1 MG CAPSULE, IMMEDIATE-RELEASE
ORAL_CAPSULE | Freq: Two times a day (BID) | ORAL | 11 refills | 30 days | Status: CP
Start: 2022-06-21 — End: ?
  Filled 2022-06-21: qty 210, 30d supply, fill #0

## 2022-06-21 MED FILL — PANTOPRAZOLE 40 MG TABLET,DELAYED RELEASE: ORAL | 30 days supply | Qty: 30 | Fill #1

## 2022-06-21 NOTE — Unmapped (Signed)
Therapy Update Follow Up: No issues - Copay = $0

## 2022-06-21 NOTE — Unmapped (Signed)
Addended by: Genia Harold on: 06/21/2022 03:56 PM     Modules accepted: Orders

## 2022-06-24 DIAGNOSIS — Z944 Liver transplant status: Principal | ICD-10-CM

## 2022-06-24 DIAGNOSIS — E612 Magnesium deficiency: Principal | ICD-10-CM

## 2022-06-24 DIAGNOSIS — Z5181 Encounter for therapeutic drug level monitoring: Principal | ICD-10-CM

## 2022-06-24 NOTE — Unmapped (Addendum)
[facility] Pharmacist has reviewed a new prescription for tacrolimus that indicates a dose decrease.  Patient was counseled on this dosage change by [facility]- see epic note from 06/20/22.  Next refill call date adjusted if necessary.          Clinical Assessment Needed For: Dose Change  Medication: Tacrolimus 1mg  capsule  Last Fill Date/Day Supply: 06/21/2022 / 30 days  Refill Too Soon until 07/14/2022  Was previous dose already scheduled to fill: No    Notes to Pharmacist: Will re-test on 12/04

## 2022-06-26 DIAGNOSIS — E612 Magnesium deficiency: Principal | ICD-10-CM

## 2022-06-26 DIAGNOSIS — Z5181 Encounter for therapeutic drug level monitoring: Principal | ICD-10-CM

## 2022-06-26 DIAGNOSIS — Z944 Liver transplant status: Principal | ICD-10-CM

## 2022-06-26 LAB — CBC W/ DIFFERENTIAL
BASOPHILS ABSOLUTE COUNT: 0 10*3/uL (ref 0.0–0.2)
BASOPHILS RELATIVE PERCENT: 0 %
EOSINOPHILS ABSOLUTE COUNT: 0.2 10*3/uL (ref 0.0–0.4)
EOSINOPHILS RELATIVE PERCENT: 2 %
HEMATOCRIT: 37.2 % — ABNORMAL LOW (ref 37.5–51.0)
HEMOGLOBIN: 12.3 g/dL — ABNORMAL LOW (ref 13.0–17.7)
LYMPHOCYTES ABSOLUTE COUNT: 1.1 10*3/uL (ref 0.7–3.1)
LYMPHOCYTES RELATIVE PERCENT: 13 %
MEAN CORPUSCULAR HEMOGLOBIN CONC: 33.1 g/dL (ref 31.5–35.7)
MEAN CORPUSCULAR HEMOGLOBIN: 26.8 pg (ref 26.6–33.0)
MEAN CORPUSCULAR VOLUME: 81 fL (ref 79–97)
MONOCYTES ABSOLUTE COUNT: 0.7 10*3/uL (ref 0.1–0.9)
MONOCYTES RELATIVE PERCENT: 8 %
NEUTROPHILS ABSOLUTE COUNT: 6.8 10*3/uL (ref 1.4–7.0)
NEUTROPHILS RELATIVE PERCENT: 77 %
PLATELET COUNT: 179 10*3/uL (ref 150–450)
RED BLOOD CELL COUNT: 4.59 x10E6/uL (ref 4.14–5.80)
RED CELL DISTRIBUTION WIDTH: 12.1 % (ref 11.6–15.4)
WHITE BLOOD CELL COUNT: 8.8 10*3/uL (ref 3.4–10.8)

## 2022-06-26 LAB — COMPREHENSIVE METABOLIC PANEL
A/G RATIO: 1.6 (ref 1.2–2.2)
ALBUMIN: 4.3 g/dL (ref 3.9–4.9)
ALKALINE PHOSPHATASE: 151 IU/L — ABNORMAL HIGH (ref 44–121)
ALT (SGPT): 18 IU/L (ref 0–44)
AST (SGOT): 18 IU/L (ref 0–40)
BILIRUBIN TOTAL (MG/DL) IN SER/PLAS: 0.6 mg/dL (ref 0.0–1.2)
BLOOD UREA NITROGEN: 20 mg/dL (ref 8–27)
BUN / CREAT RATIO: 14 (ref 10–24)
CALCIUM: 9.9 mg/dL (ref 8.6–10.2)
CHLORIDE: 104 mmol/L (ref 96–106)
CO2: 21 mmol/L (ref 20–29)
CREATININE: 1.44 mg/dL — ABNORMAL HIGH (ref 0.76–1.27)
GLOBULIN, TOTAL: 2.7 g/dL (ref 1.5–4.5)
GLUCOSE: 176 mg/dL — ABNORMAL HIGH (ref 70–99)
POTASSIUM: 4 mmol/L (ref 3.5–5.2)
SODIUM: 141 mmol/L (ref 134–144)
TOTAL PROTEIN: 7 g/dL (ref 6.0–8.5)

## 2022-06-26 LAB — BILIRUBIN, DIRECT: BILIRUBIN DIRECT: 0.15 mg/dL (ref 0.00–0.40)

## 2022-06-26 LAB — MAGNESIUM: MAGNESIUM: 1.5 mg/dL — ABNORMAL LOW (ref 1.6–2.3)

## 2022-06-26 LAB — PHOSPHORUS: PHOSPHORUS, SERUM: 3.6 mg/dL (ref 2.8–4.1)

## 2022-06-26 LAB — GAMMA GT: GAMMA GLUTAMYL TRANSFERASE: 44 IU/L (ref 0–65)

## 2022-06-28 LAB — TACROLIMUS LEVEL: TACROLIMUS BLOOD: 6.9 ng/mL (ref 2.0–20.0)

## 2022-06-28 LAB — EVEROLIMUS: EVEROLIMUS LEVEL: 4.9 ng/mL (ref 3.0–8.0)

## 2022-07-01 DIAGNOSIS — Z944 Liver transplant status: Principal | ICD-10-CM

## 2022-07-01 DIAGNOSIS — E612 Magnesium deficiency: Principal | ICD-10-CM

## 2022-07-01 DIAGNOSIS — M25531 Pain in right wrist: Principal | ICD-10-CM

## 2022-07-01 DIAGNOSIS — Z5181 Encounter for therapeutic drug level monitoring: Principal | ICD-10-CM

## 2022-07-01 MED ORDER — GABAPENTIN 300 MG CAPSULE
ORAL_CAPSULE | Freq: Three times a day (TID) | ORAL | 2 refills | 30 days
Start: 2022-07-01 — End: ?

## 2022-07-01 MED ORDER — BLOOD-GLUCOSE METER
0 refills | 0.00000 days | Status: CN
Start: 2022-07-01 — End: 2023-07-01

## 2022-07-01 NOTE — Unmapped (Signed)
The patient is requesting a medication refill

## 2022-07-02 DIAGNOSIS — R197 Diarrhea, unspecified: Principal | ICD-10-CM

## 2022-07-02 MED ORDER — BLOOD-GLUCOSE METER
0 refills | 0 days
Start: 2022-07-02 — End: 2023-07-01

## 2022-07-02 MED FILL — ATOVAQUONE 750 MG/5 ML ORAL SUSPENSION: ORAL | 30 days supply | Qty: 300 | Fill #3

## 2022-07-02 NOTE — Unmapped (Signed)
The patient is requesting a medication refill

## 2022-07-02 NOTE — Unmapped (Signed)
Patient's 11/14 tac level elevated above goal, after dose reduced to 3mg  bid on 11/10. Also, most results now available from stool cx were negative, but CDiff testing canceled, noting inappropriate sample. Spoke with patient, who reported he took the stool sample on 11/14, but it is registering as collected on 11/6. He stated he continues to have a few episodes of diarrhea daily, but it has improved and he denies any occult blood or other sx. Contacted [facility], but unable to decipher why the CDiff testing was canceled. Reordered this test and returned call to patient to request he take in another sample. On discussion of his IS administration, he reported he takes it at 9a/9p, but labs were done around or afternoon. While he states he does not take it before labs, the troughs are potentially 15 hrs, including today's (all results pending.) Patient agreed to make a concerted effort to assure he takes the meds consistently and obtains a 12 hr trough. Instructed him to send message to [facility] when he takes in stool sample to look for results. Explained that he could not take imodium unless CDiff results are negative. He verbalized understanding of all discussed.

## 2022-07-02 NOTE — Unmapped (Signed)
[facility] Refill Coordination Note    Specialty Medication(s) to be Shipped:   Transplant: Atovaquone suspension and Zortress 1mg     Other medication(s) to be shipped:  meter , duloxetine , gabapentin , veltessa     Matthew Key, DOB: 10-May-1961  Phone: [phone] (home)       All above HIPAA information was verified with patient.     Was a Nurse, learning disability used for this call? No    Completed refill call assessment today to schedule patient's medication shipment from the [facility] [phone]).  All relevant notes have been reviewed.     Specialty medication(s) and dose(s) confirmed: Regimen is correct and unchanged.   Changes to medications: Matthew Key reports no changes at this time.  Changes to insurance: No  New side effects reported not previously addressed with a pharmacist or physician: None reported  Questions for the pharmacist: No    Confirmed patient received a Conservation officer, historic buildings and a Surveyor, mining with first shipment. The patient will receive a drug information handout for each medication shipped and additional FDA Medication Guides as required.       DISEASE/MEDICATION-SPECIFIC INFORMATION        N/A    SPECIALTY MEDICATION ADHERENCE     Medication Adherence    Patient reported X missed doses in the last month: 0  Specialty Medication: ZORTRESS 1 mg Tab (everolimus (immunosuppressive))  Patient is on additional specialty medications: Yes  Additional Specialty Medications: atovaquone 750 mg/5 mL suspension [facility])  Patient Reported Additional Medication X Missed Doses in the Last Month: 0  Patient is on more than two specialty medications: No                                Were doses missed due to medication being on hold? No    Zortress 1 mg: 11 days of medicine on hand   Atovaquone 750/5 mg/ml: 0 days of medicine on hand     REFERRAL TO PHARMACIST     Referral to the pharmacist: Not needed      [facility]     Shipping address confirmed in Epic. Delivery Scheduled: Yes, Expected medication delivery date: 07/03/22 for atovaquone 07/09/22 everything else.     Medication will be delivered via UPS to the prescription address in Epic WAM.    [name]   [facility] Specialty Technician

## 2022-07-02 NOTE — Unmapped (Signed)
Patient paged on call coordinator to relay he has run out of atovaquone - expects shipment from [facility] on Wednesday (confirmed note from [facility] mentions delivery on 11/22).  He confirms he took morning dose today but will not have dosing for tomorrow.  Confirmed he should resume taking medication upon arrival on Wednesday, anticipate one missed dose (11/21).  Patient aware of calling early for medication refills in the future - notes he thought he had another bottle but realized this afternoon he didn't.

## 2022-07-03 DIAGNOSIS — E612 Magnesium deficiency: Principal | ICD-10-CM

## 2022-07-03 DIAGNOSIS — Z5181 Encounter for therapeutic drug level monitoring: Principal | ICD-10-CM

## 2022-07-03 DIAGNOSIS — Z944 Liver transplant status: Principal | ICD-10-CM

## 2022-07-03 LAB — BILIRUBIN, DIRECT: BILIRUBIN DIRECT: 0.22 mg/dL (ref 0.00–0.40)

## 2022-07-03 LAB — COMPREHENSIVE METABOLIC PANEL
A/G RATIO: 1.8 (ref 1.2–2.2)
ALBUMIN: 4.5 g/dL (ref 3.9–4.9)
ALKALINE PHOSPHATASE: 169 IU/L — ABNORMAL HIGH (ref 44–121)
ALT (SGPT): 16 IU/L (ref 0–44)
AST (SGOT): 18 IU/L (ref 0–40)
BILIRUBIN TOTAL (MG/DL) IN SER/PLAS: 0.8 mg/dL (ref 0.0–1.2)
BLOOD UREA NITROGEN: 17 mg/dL (ref 8–27)
BUN / CREAT RATIO: 12 (ref 10–24)
CALCIUM: 10.8 mg/dL — ABNORMAL HIGH (ref 8.6–10.2)
CHLORIDE: 104 mmol/L (ref 96–106)
CO2: 20 mmol/L (ref 20–29)
CREATININE: 1.38 mg/dL — ABNORMAL HIGH (ref 0.76–1.27)
GLOBULIN, TOTAL: 2.5 g/dL (ref 1.5–4.5)
GLUCOSE: 139 mg/dL — ABNORMAL HIGH (ref 70–99)
POTASSIUM: 4.5 mmol/L (ref 3.5–5.2)
SODIUM: 140 mmol/L (ref 134–144)
TOTAL PROTEIN: 7 g/dL (ref 6.0–8.5)

## 2022-07-03 LAB — CBC W/ DIFFERENTIAL
BASOPHILS ABSOLUTE COUNT: 0 10*3/uL (ref 0.0–0.2)
BASOPHILS RELATIVE PERCENT: 0 %
EOSINOPHILS ABSOLUTE COUNT: 0.2 10*3/uL (ref 0.0–0.4)
EOSINOPHILS RELATIVE PERCENT: 3 %
HEMATOCRIT: 38.9 % (ref 37.5–51.0)
HEMOGLOBIN: 13.1 g/dL (ref 13.0–17.7)
LYMPHOCYTES ABSOLUTE COUNT: 1.5 10*3/uL (ref 0.7–3.1)
LYMPHOCYTES RELATIVE PERCENT: 19 %
MEAN CORPUSCULAR HEMOGLOBIN CONC: 33.7 g/dL (ref 31.5–35.7)
MEAN CORPUSCULAR HEMOGLOBIN: 27.1 pg (ref 26.6–33.0)
MEAN CORPUSCULAR VOLUME: 80 fL (ref 79–97)
MONOCYTES ABSOLUTE COUNT: 0.3 10*3/uL (ref 0.1–0.9)
MONOCYTES RELATIVE PERCENT: 4 %
NEUTROPHILS ABSOLUTE COUNT: 5.8 10*3/uL (ref 1.4–7.0)
NEUTROPHILS RELATIVE PERCENT: 71 %
PLATELET COUNT: 168 10*3/uL (ref 150–450)
RED BLOOD CELL COUNT: 4.84 x10E6/uL (ref 4.14–5.80)
RED CELL DISTRIBUTION WIDTH: 12.6 % (ref 11.6–15.4)
WHITE BLOOD CELL COUNT: 8.1 10*3/uL (ref 3.4–10.8)

## 2022-07-03 LAB — IMMATURE CELLS: METAMYLOCYTES-LABCORP: 3 % — ABNORMAL HIGH (ref 0–0)

## 2022-07-03 LAB — MAGNESIUM: MAGNESIUM: 1.4 mg/dL — ABNORMAL LOW (ref 1.6–2.3)

## 2022-07-03 LAB — PHOSPHORUS: PHOSPHORUS, SERUM: 4.3 mg/dL — ABNORMAL HIGH (ref 2.8–4.1)

## 2022-07-03 LAB — GAMMA GT: GAMMA GLUTAMYL TRANSFERASE: 39 IU/L (ref 0–65)

## 2022-07-03 MED ORDER — GABAPENTIN 300 MG CAPSULE
ORAL_CAPSULE | Freq: Three times a day (TID) | ORAL | 2 refills | 30 days | Status: CP
Start: 2022-07-03 — End: ?
  Filled 2022-07-08: qty 90, 30d supply, fill #0

## 2022-07-05 LAB — TACROLIMUS LEVEL: TACROLIMUS BLOOD: 5.3 ng/mL (ref 2.0–20.0)

## 2022-07-05 LAB — EVEROLIMUS: EVEROLIMUS LEVEL: 2.1 ng/mL — ABNORMAL LOW (ref 3.0–8.0)

## 2022-07-08 DIAGNOSIS — Z5181 Encounter for therapeutic drug level monitoring: Principal | ICD-10-CM

## 2022-07-08 DIAGNOSIS — Z944 Liver transplant status: Principal | ICD-10-CM

## 2022-07-08 DIAGNOSIS — E612 Magnesium deficiency: Principal | ICD-10-CM

## 2022-07-08 MED ORDER — EVEROLIMUS (IMMUNOSUPPRESSIVE) 0.5 MG TABLET
ORAL_TABLET | Freq: Two times a day (BID) | ORAL | 11 refills | 30 days | Status: CP
Start: 2022-07-08 — End: 2023-07-08
  Filled 2022-07-10: qty 240, 30d supply, fill #0

## 2022-07-08 MED FILL — VELTASSA 16.8 GRAM ORAL POWDER PACKET: ORAL | 30 days supply | Qty: 30 | Fill #2

## 2022-07-08 NOTE — Unmapped (Signed)
Returned on call page - pt asking if okay to take anti-diarrheal as cdiff negative. Advised pt may take OTC imodium for symptom relief.

## 2022-07-08 NOTE — Unmapped (Addendum)
[facility] Pharmacist has reviewed a new prescription for zortress that indicates a dose  same total daily dose. Changing from 1mg  to 0.5mg  because 1mg  is not available. .  Patient was counseled on this dosage change by DR- see epic note from 07/08/22.  Next refill call date adjusted if necessary.        Clinical Assessment Needed For: Formulation Change  Medication: Zortress 0.5mg  tablet  Last Fill Date/Day Supply: 06/11/2022 / 30 days  Prior Authorization Required  Was previous dose already scheduled to fill: Yes    Notes to Pharmacist: Zortress 1mg  scheduled. Same dose.

## 2022-07-08 NOTE — Unmapped (Signed)
Matthew Key 's Zortress 1mg  shipment will be canceled  as a result of insufficient inventory of the drug.     I have reached out to the patient  at [phone] and communicated the delay. We will not reschedule the medication and have removed this/these medication(s) from the work request.  We have not confirmed the new delivery date.     Other meds are still being sent as scheduled. Pt is aware of change to 0.5mg  tablet due to availability, and issues with coverage on 0.5mg  tablets.

## 2022-07-08 NOTE — Unmapped (Signed)
Spoke to Matthew Key about sirolimus 1mg  not being available from the mfg and needing to change to the 0.5mg  tablets.  Told him that the new dose would be 4 (0.5mg ) tablets (2mg  total) two times a day.  Pt acknowledged understanding.    [facility] Clinical Assessment & Refill Coordination Note    Matthew Key, [place]: 1961/03/19  Phone: [phone] (home)     All above HIPAA information was verified with patient.     Was a Nurse, learning disability used for this call? No    Specialty Medication(s):   Transplant: sirolimus 1mg  and Sirolimus 0.5mg      Current Outpatient Medications   Medication Sig Dispense Refill    acetaminophen (TYLENOL) 325 MG tablet Take 1-2 tablets (325-650 mg total) by mouth every six (6) hours as needed for pain or fever. 100 tablet 11    amlodipine (NORVASC) 10 MG tablet Take 1 tablet (10 mg total) by mouth daily. 30 tablet 11    amlodipine (NORVASC) 10 MG tablet Take 1 tablet (10 mg total) by mouth daily. 30 tablet 11    aspirin (ECOTRIN) 81 MG tablet Take 1 tablet (81 mg total) by mouth daily. 90 tablet 3    atorvastatin (LIPITOR) 40 MG tablet Take 1 tablet (40 mg total) by mouth daily. Hold until directed to take by nurse coordinator 30 tablet 11    atovaquone (MEPRON) 750 mg/5 mL suspension Take 10 mL (1,500 mg total) by mouth daily. 300 mL 3    blood sugar diagnostic (ACCU-CHEK GUIDE TEST STRIPS) Strp Use to check blood sugar as directed with insulin 3 times a day & for symptoms of high or low blood sugar 100 strip 11    blood-glucose meter Misc Use to test blood sugar as directed 1 each 0    blood-glucose meter kit Use as instructed 1 each 0    blood-glucose meter kit Use as instructed 1 each 1    blood-glucose meter kit Use as instructed 1 each 0    docusate sodium (COLACE) 100 MG capsule Take 1 capsule (100 mg total) by mouth two (2) times a day as needed for constipation. 60 capsule 11    DULoxetine (CYMBALTA) 30 MG capsule Take 2 capsules (60 mg total) by mouth daily. 180 capsule 3    everolimus, immunosuppressive, (ZORTRESS) 0.5 mg tablet Take 4 tablets (2 mg total) by mouth two (2) times a day. 240 tablet 11    gabapentin (NEURONTIN) 300 MG capsule Take 1 capsule (300 mg total) by mouth Three (3) times a day. 90 capsule 2    glucagon spray 3 mg/actuation Spry Use 1 spray into the left nostril every fifteen (15) minutes as needed (low blood sugar). 2 each 1    hydrOXYzine (VISTARIL) 50 MG capsule Take 1 capsule (50 mg total) by mouth nightly as needed for anxiety. 30 capsule 3    insulin ASPART (NOVOLOG FLEXPEN) 100 unit/mL (3 mL) injection pen Inject under the skin per sliding scale prior to meals. If premeal BG 151-200 take 2 additional units for correction, if 201-250 take 4 additional units, etc. Max dose 36 units per day.  Store in-use prefilled pens at room temperature <86??F and use within 28 days; do not refrigerate. 15 mL 6    insulin degludec (TRESIBA FLEXTOUCH U-100) 100 unit/mL (3 mL) InPn Inject 0.13 mL (13 Units total) under the skin daily. Adjust as instructed. 15 mL 3    lancets (ACCU-CHEK SOFTCLIX LANCETS) Misc Use to  check blood sugar as directed with insulin 3 times a day & for symptoms of high or low blood sugar. 100 each 0    magnesium oxide-Mg AA chelate (MAGNESIUM, AMINO ACID CHELATE,) 133 mg Take 2 tablets by mouth two (2) times a day. 360 tablet 3    melatonin 3 mg Tab Take 1 tablet (3 mg total) by mouth every evening. 30 tablet 11    methocarbamoL (ROBAXIN) 500 MG tablet Take 1 tablet (500 mg total) by mouth Three (3) times a day as needed. 90 tablet 0    pantoprazole (PROTONIX) 40 MG tablet Take 1 tablet (40 mg total) by mouth daily as needed. 30 tablet 11    patiromer calcium sorbitex (VELTASSA) 16.8 gram powder packet Take 1 packet (16.8 g total) by mouth daily. 30 packet 2    pen needle, diabetic (BD ULTRA-FINE NANO PEN NEEDLE) 32 gauge x 5/32 (4 mm) Ndle Use as directed  1-2 times per day 100 each 12    polyethylene glycol (MIRALAX) 17 gram/dose powder Mix 17 g (use measure line in cap) in 4-8 ounces of water, juice, soda, coffee, or tea and take by mouth daily as needed. 238 g 3    semaglutide (OZEMPIC) 0.25 mg or 0.5 mg (2 mg/3 mL) PnIj Inject 0.5 mg under the skin every seven (7) days. 6 mL 11    tacrolimus (PROGRAF) 1 MG capsule Take 3 capsules (3 mg total) by mouth two (2) times a day. 180 capsule 11     No current facility-administered medications for this visit.        Changes to medications: Ava reports no changes at this time.    Allergies   Allergen Reactions    Venom-Honey Bee Swelling       Changes to allergies: No    SPECIALTY MEDICATION ADHERENCE     Sirolimus 1 mg: 3 days of medicine on hand   Sirolimus 0.5 mg: 0 days of medicine on hand       Medication Adherence    Patient reported X missed doses in the last month: 0  Specialty Medication: Sirolimus 1mg   Patient is on additional specialty medications: No                            Specialty medication(s) dose(s) confirmed: Regimen is correct and unchanged.     Are there any concerns with adherence? No    Adherence counseling provided? Not needed    CLINICAL MANAGEMENT AND INTERVENTION      Clinical Benefit Assessment:    Do you feel the medicine is effective or helping your condition? Yes    Clinical Benefit counseling provided? Not needed    Adverse Effects Assessment:    Are you experiencing any side effects? No    Are you experiencing difficulty administering your medicine? 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 For example, brushing your teeth or getting up in the morning. 0    Have you discussed this with your provider? Not needed    Acute Infection Status:    Acute infections noted within Epic:  No active infections  Patient reported infection: None    Therapy Appropriateness:  Is therapy appropriate and patient progressing towards therapeutic goals? Yes, therapy is appropriate and should be continued    DISEASE/MEDICATION-SPECIFIC INFORMATION      N/A    Solid Organ Transplant: Not Applicable    PATIENT SPECIFIC NEEDS     Does the patient have any physical, cognitive, or cultural barriers? No    Is the patient high risk? No    Did the patient require a clinical intervention? No    Does the patient require physician intervention or other additional services (i.e., nutrition, smoking cessation, social work)? No    SOCIAL DETERMINANTS OF HEALTH     At the [facility], we have learned that life circumstances - like trouble affording food, housing, utilities, or transportation can affect the health of many of our patients.   That is why we wanted to ask: are you currently experiencing any life circumstances that are negatively impacting your health and/or quality of life? No    Social Determinants of Health     Financial Resource Strain: Low Risk  (08/28/2021)    Overall Financial Resource Strain (CARDIA)     Difficulty of Paying Living Expenses: Not hard at all   Internet Connectivity: Not on file   Food Insecurity: No Food Insecurity (08/28/2021)    Hunger Vital Sign     Worried About Running Out of Food in the Last Year: Never true     Ran Out of Food in the Last Year: Never true   Tobacco Use: Low Risk  (05/06/2022)    Patient History     Smoking Tobacco Use: Never     Smokeless Tobacco Use: Never     Passive Exposure: Not on file   Housing/Utilities: Low Risk  (08/28/2021)    Housing/Utilities     Within the past 12 months, have you ever stayed: outside, in a car, in a tent, in an overnight shelter, or temporarily in someone else's home (i.e. couch-surfing)?: No     Are you worried about losing your housing?: No     Within the past 12 months, have you been unable to get utilities (heat, electricity) when it was really needed?: No   Alcohol Use: Not on file   Transportation Needs: No Transportation Needs (08/28/2021)    PRAPARE - Therapist, art (Medical): No     Lack of Transportation (Non-Medical): No   Substance Use: Not on file   Health Literacy: Not on file   Physical Activity: Not on file   Interpersonal Safety: Not on file   Stress: Not on file   Intimate Partner Violence: Not on file   Depression: Not at risk (12/19/2020)    PHQ-2     PHQ-2 Score: 0   Social Connections: Not on file       Would you be willing to receive help with any of the needs that you have identified today? Not applicable       SHIPPING     Specialty Medication(s) to be Shipped:   Transplant: sirolimus 0.5mg     Other medication(s) to be shipped: No additional medications requested for fill at this time     Changes to insurance: No    Delivery Scheduled: Yes, Expected medication delivery date: 07/10/22.     Medication will be delivered via UPS to the confirmed prescription address in [facility].    The patient will receive a drug information handout for each medication shipped and additional FDA Medication Guides as required.  Verified that patient has previously received a Conservation officer, historic buildings and a Surveyor, mining.    The patient or caregiver noted above participated in the development of this care plan and knows that they can request review of or adjustments to the care plan at any time.      All of the patient's questions and concerns have been addressed.    Tera Helper, [facility]   [facility] Specialty Pharmacist

## 2022-07-09 NOTE — Unmapped (Signed)
Reviewed 11/21 tac level with CPP Chargualaf:  From: Jordan Likes, CPP  Sent: 07/08/2022   3:35 PM EST  To: Johnna Acosta, RN; Genia Harold, RN    Let's see another level before adjusting    ----- Message -----  From: Johnna Acosta, RN  Sent: 07/08/2022  10:35 AM EST  To: Genia Harold, RN; Lorel Monaco Chargualaf, CPP    Says he takes meds @ 9a/9p - so EVL is 15 hr trough. You want to increase his dose?

## 2022-07-09 NOTE — Unmapped (Signed)
Jermany Marlinda Mike Gardy 's Everolimus shipment will be sent out  as a result of prior authorization now approved.     I have reached out to the patient  at [phone] and communicated the delivery change. We will reschedule the medication for the delivery date that the patient agreed upon.  We have confirmed the delivery date as 07/11/2022, via ups.

## 2022-07-09 NOTE — Unmapped (Signed)
Opened in error

## 2022-07-09 NOTE — Unmapped (Signed)
Matthew Key 's Everolimus 0.5mg  shipment will be sent out  as a result of prior authorization now approved.     I have reached out to the patient  at [phone] and communicated the delivery change. We will reschedule the medication for the delivery date that the patient agreed upon.  We have confirmed the delivery date as 07/11/2022, via ups.

## 2022-07-10 DIAGNOSIS — E612 Magnesium deficiency: Principal | ICD-10-CM

## 2022-07-10 DIAGNOSIS — Z5181 Encounter for therapeutic drug level monitoring: Principal | ICD-10-CM

## 2022-07-10 DIAGNOSIS — Z944 Liver transplant status: Principal | ICD-10-CM

## 2022-07-10 LAB — CBC W/ AUTO DIFF
BASOPHILS ABSOLUTE COUNT: 0 10*9/L (ref 0.0–0.1)
BASOPHILS RELATIVE PERCENT: 0.4 %
EOSINOPHILS ABSOLUTE COUNT: 0.1 10*9/L (ref 0.0–0.5)
EOSINOPHILS RELATIVE PERCENT: 1.2 %
HEMATOCRIT: 35.6 % — ABNORMAL LOW (ref 39.0–48.0)
HEMOGLOBIN: 12.3 g/dL — ABNORMAL LOW (ref 12.9–16.5)
LYMPHOCYTES ABSOLUTE COUNT: 1.3 10*9/L (ref 1.1–3.6)
LYMPHOCYTES RELATIVE PERCENT: 19.4 %
MEAN CORPUSCULAR HEMOGLOBIN CONC: 34.6 g/dL (ref 32.0–36.0)
MEAN CORPUSCULAR HEMOGLOBIN: 26.9 pg (ref 25.9–32.4)
MEAN CORPUSCULAR VOLUME: 77.8 fL (ref 77.6–95.7)
MEAN PLATELET VOLUME: 9.6 fL (ref 6.8–10.7)
MONOCYTES ABSOLUTE COUNT: 0.5 10*9/L (ref 0.3–0.8)
MONOCYTES RELATIVE PERCENT: 7 %
NEUTROPHILS ABSOLUTE COUNT: 4.8 10*9/L (ref 1.8–7.8)
NEUTROPHILS RELATIVE PERCENT: 72 %
PLATELET COUNT: 156 10*9/L (ref 150–450)
RED BLOOD CELL COUNT: 4.57 10*12/L (ref 4.26–5.60)
RED CELL DISTRIBUTION WIDTH: 13.2 % (ref 12.2–15.2)
WBC ADJUSTED: 6.7 10*9/L (ref 3.6–11.2)

## 2022-07-10 LAB — COMPREHENSIVE METABOLIC PANEL
A/G RATIO: 1.7 (ref 1.2–2.2)
ALBUMIN: 4.5 g/dL (ref 3.9–4.9)
ALBUMIN: 3.8 g/dL (ref 3.4–5.0)
ALKALINE PHOSPHATASE: 372 IU/L — ABNORMAL HIGH (ref 44–121)
ALKALINE PHOSPHATASE: 332 U/L — ABNORMAL HIGH (ref 46–116)
ALT (SGPT): 163 IU/L — ABNORMAL HIGH (ref 0–44)
ALT (SGPT): 235 U/L — ABNORMAL HIGH (ref 10–49)
ANION GAP: 4 mmol/L — ABNORMAL LOW (ref 5–14)
AST (SGOT): 266 IU/L — ABNORMAL HIGH (ref 0–40)
AST (SGOT): 105 U/L — ABNORMAL HIGH (ref <=34)
BILIRUBIN TOTAL (MG/DL) IN SER/PLAS: 0.7 mg/dL (ref 0.0–1.2)
BILIRUBIN TOTAL: 0.7 mg/dL (ref 0.3–1.2)
BLOOD UREA NITROGEN: 18 mg/dL (ref 8–27)
BLOOD UREA NITROGEN: 16 mg/dL (ref 9–23)
BUN / CREAT RATIO: 12 (ref 10–24)
BUN / CREAT RATIO: 13
CALCIUM: 10 mg/dL (ref 8.6–10.2)
CALCIUM: 9.7 mg/dL (ref 8.7–10.4)
CHLORIDE: 102 mmol/L (ref 96–106)
CHLORIDE: 103 mmol/L (ref 98–107)
CO2: 23 mmol/L (ref 20–29)
CO2: 28 mmol/L (ref 20.0–31.0)
CREATININE: 1.51 mg/dL — ABNORMAL HIGH (ref 0.76–1.27)
CREATININE: 1.24 mg/dL — ABNORMAL HIGH
EGFR CKD-EPI (2021) MALE: 66 mL/min/{1.73_m2} (ref >=60)
GLOBULIN, TOTAL: 2.7 g/dL (ref 1.5–4.5)
GLUCOSE RANDOM: 255 mg/dL — ABNORMAL HIGH (ref 70–179)
GLUCOSE: 217 mg/dL — ABNORMAL HIGH (ref 70–99)
POTASSIUM: 4.7 mmol/L (ref 3.5–5.2)
POTASSIUM: 4.3 mmol/L (ref 3.4–4.8)
PROTEIN TOTAL: 7 g/dL (ref 5.7–8.2)
SODIUM: 143 mmol/L (ref 134–144)
SODIUM: 135 mmol/L (ref 135–145)
TOTAL PROTEIN: 7.2 g/dL (ref 6.0–8.5)

## 2022-07-10 LAB — CBC W/ DIFFERENTIAL
BASOPHILS ABSOLUTE COUNT: 0.1 10*3/uL (ref 0.0–0.2)
BASOPHILS RELATIVE PERCENT: 1 %
EOSINOPHILS ABSOLUTE COUNT: 0.2 10*3/uL (ref 0.0–0.4)
EOSINOPHILS RELATIVE PERCENT: 3 %
HEMATOCRIT: 39.6 % (ref 37.5–51.0)
HEMOGLOBIN: 12.3 g/dL — ABNORMAL LOW (ref 13.0–17.7)
LYMPHOCYTES ABSOLUTE COUNT: 1.3 10*3/uL (ref 0.7–3.1)
LYMPHOCYTES RELATIVE PERCENT: 19 %
MEAN CORPUSCULAR HEMOGLOBIN CONC: 31.1 g/dL — ABNORMAL LOW (ref 31.5–35.7)
MEAN CORPUSCULAR HEMOGLOBIN: 25.5 pg — ABNORMAL LOW (ref 26.6–33.0)
MEAN CORPUSCULAR VOLUME: 82 fL (ref 79–97)
MONOCYTES ABSOLUTE COUNT: 0.5 10*3/uL (ref 0.1–0.9)
MONOCYTES RELATIVE PERCENT: 7 %
NEUTROPHILS ABSOLUTE COUNT: 4.6 10*3/uL (ref 1.4–7.0)
NEUTROPHILS RELATIVE PERCENT: 67 %
PLATELET COUNT: 212 10*3/uL (ref 150–450)
RED BLOOD CELL COUNT: 4.83 x10E6/uL (ref 4.14–5.80)
RED CELL DISTRIBUTION WIDTH: 12.9 % (ref 11.6–15.4)
WHITE BLOOD CELL COUNT: 6.7 10*3/uL (ref 3.4–10.8)

## 2022-07-10 LAB — MAGNESIUM
MAGNESIUM: 1.5 mg/dL — ABNORMAL LOW (ref 1.6–2.3)
MAGNESIUM: 1.6 mg/dL (ref 1.6–2.6)

## 2022-07-10 LAB — BILIRUBIN, DIRECT: BILIRUBIN DIRECT: 0.28 mg/dL (ref 0.00–0.40)

## 2022-07-10 LAB — APTT
APTT: 33.4 s (ref 24.8–38.4)
HEPARIN CORRELATION: 0.2

## 2022-07-10 LAB — PHOSPHORUS
PHOSPHORUS, SERUM: 4 mg/dL (ref 2.8–4.1)
PHOSPHORUS: 3.5 mg/dL (ref 2.4–5.1)

## 2022-07-10 LAB — PROTIME-INR
INR: 0.96
PROTIME: 10.8 s (ref 9.9–12.6)

## 2022-07-10 LAB — GAMMA GT: GAMMA GLUTAMYL TRANSFERASE: 454 IU/L — ABNORMAL HIGH (ref 0–65)

## 2022-07-10 LAB — IMMATURE CELLS
BANDS: 2 %
METAMYLOCYTES-LABCORP: 1 % — ABNORMAL HIGH (ref 0–0)

## 2022-07-11 ENCOUNTER — Ambulatory Visit: Admit: 2022-07-11 | Payer: MEDICARE

## 2022-07-11 ENCOUNTER — Encounter: Admit: 2022-07-11 | Discharge: 2022-07-19 | Payer: MEDICARE | Attending: Anesthesiology

## 2022-07-11 ENCOUNTER — Ambulatory Visit: Admit: 2022-07-11 | Discharge: 2022-07-19 | Disposition: A | Discharge status: Home Health | Payer: MEDICARE

## 2022-07-11 ENCOUNTER — Ambulatory Visit: Admit: 2022-07-11 | Discharge: 2022-07-19 | Payer: MEDICARE

## 2022-07-11 LAB — EVEROLIMUS: EVEROLIMUS LEVEL: 5.4 ng/mL (ref 3.0–15.0)

## 2022-07-11 LAB — COMPREHENSIVE METABOLIC PANEL
ALBUMIN: 3.5 g/dL (ref 3.4–5.0)
ALKALINE PHOSPHATASE: 349 U/L — ABNORMAL HIGH (ref 46–116)
ALT (SGPT): 251 U/L — ABNORMAL HIGH (ref 10–49)
ANION GAP: 9 mmol/L (ref 5–14)
AST (SGOT): 172 U/L — ABNORMAL HIGH (ref <=34)
BILIRUBIN TOTAL: 0.7 mg/dL (ref 0.3–1.2)
BLOOD UREA NITROGEN: 16 mg/dL (ref 9–23)
BUN / CREAT RATIO: 14
CALCIUM: 9.2 mg/dL (ref 8.7–10.4)
CHLORIDE: 104 mmol/L (ref 98–107)
CO2: 25 mmol/L (ref 20.0–31.0)
CREATININE: 1.17 mg/dL
EGFR CKD-EPI (2021) MALE: 70 mL/min/{1.73_m2} (ref >=60)
GLUCOSE RANDOM: 184 mg/dL — ABNORMAL HIGH (ref 70–179)
POTASSIUM: 3.8 mmol/L (ref 3.4–4.8)
PROTEIN TOTAL: 6.6 g/dL (ref 5.7–8.2)
SODIUM: 138 mmol/L (ref 135–145)

## 2022-07-11 LAB — TACROLIMUS LEVEL: TACROLIMUS BLOOD: 7.7 ng/mL (ref 2.0–20.0)

## 2022-07-11 LAB — CBC
HEMATOCRIT: 34.7 % — ABNORMAL LOW (ref 39.0–48.0)
HEMOGLOBIN: 11.9 g/dL — ABNORMAL LOW (ref 12.9–16.5)
MEAN CORPUSCULAR HEMOGLOBIN CONC: 34.2 g/dL (ref 32.0–36.0)
MEAN CORPUSCULAR HEMOGLOBIN: 26.8 pg (ref 25.9–32.4)
MEAN CORPUSCULAR VOLUME: 78.3 fL (ref 77.6–95.7)
MEAN PLATELET VOLUME: 9.5 fL (ref 6.8–10.7)
PLATELET COUNT: 148 10*9/L — ABNORMAL LOW (ref 150–450)
RED BLOOD CELL COUNT: 4.43 10*12/L (ref 4.26–5.60)
RED CELL DISTRIBUTION WIDTH: 13.1 % (ref 12.2–15.2)
WBC ADJUSTED: 6.8 10*9/L (ref 3.6–11.2)

## 2022-07-11 LAB — SLIDE REVIEW

## 2022-07-11 LAB — MAGNESIUM: MAGNESIUM: 1.6 mg/dL (ref 1.6–2.6)

## 2022-07-11 LAB — BILIRUBIN, DIRECT: BILIRUBIN DIRECT: 0.4 mg/dL — ABNORMAL HIGH (ref 0.00–0.30)

## 2022-07-11 LAB — GAMMA GT: GAMMA GLUTAMYL TRANSFERASE: 419 U/L — ABNORMAL HIGH

## 2022-07-11 LAB — PHOSPHORUS: PHOSPHORUS: 4 mg/dL (ref 2.4–5.1)

## 2022-07-11 LAB — TACROLIMUS LEVEL, TIMED: TACROLIMUS BLOOD: 5.2 ng/mL

## 2022-07-11 MED ADMIN — everolimus (immunosuppressive) (ZORTRESS) tablet 2 mg: 2 mg | ORAL | @ 15:00:00

## 2022-07-11 MED ADMIN — gabapentin (NEURONTIN) capsule 300 mg: 300 mg | ORAL | @ 18:00:00

## 2022-07-11 MED ADMIN — amlodipine (NORVASC) tablet 10 mg: 10 mg | ORAL | @ 15:00:00

## 2022-07-11 MED ADMIN — magnesium oxide-Mg AA chelate (Magnesium Plus Protein) 2 tablet: 2 | ORAL | @ 15:00:00 | Stop: 2022-07-11

## 2022-07-11 MED ADMIN — gabapentin (NEURONTIN) capsule 300 mg: 300 mg | ORAL | @ 15:00:00

## 2022-07-11 MED ADMIN — sodium chloride (NS) 0.9 % infusion: 100 mL/h | INTRAVENOUS | @ 14:00:00 | Stop: 2022-07-11

## 2022-07-11 MED ADMIN — insulin lispro (HumaLOG) injection 0-20 Units: 0-20 [IU] | SUBCUTANEOUS | @ 18:00:00

## 2022-07-11 MED ADMIN — atovaquone (MEPRON) oral suspension: 1500 mg | ORAL | @ 15:00:00 | Stop: 2022-07-11

## 2022-07-11 MED ADMIN — melatonin tablet 3 mg: 3 mg | ORAL | @ 07:00:00

## 2022-07-11 MED ADMIN — insulin regular (HumuLIN,NovoLIN) injection 0-20 Units: 0-20 [IU] | SUBCUTANEOUS | @ 15:00:00 | Stop: 2022-07-11

## 2022-07-11 MED ADMIN — sodium chloride (NS) 0.9 % infusion: 100 mL/h | INTRAVENOUS | @ 22:00:00

## 2022-07-11 MED ADMIN — aspirin chewable tablet 81 mg: 81 mg | ORAL | @ 15:00:00

## 2022-07-11 MED ADMIN — methocarbamoL (ROBAXIN) tablet 500 mg: 500 mg | ORAL | @ 16:00:00

## 2022-07-11 MED ADMIN — tacrolimus (PROGRAF) capsule 3 mg: 3 mg | ORAL | @ 15:00:00

## 2022-07-11 MED ADMIN — sodium chloride (NS) 0.9 % infusion: 100 mL/h | INTRAVENOUS | @ 05:00:00 | Stop: 2022-07-11

## 2022-07-11 MED ADMIN — magnesium sulfate 2gm/50mL IVPB: 2 g | INTRAVENOUS | @ 18:00:00

## 2022-07-11 MED ADMIN — acetaminophen (TYLENOL) tablet 650 mg: 650 mg | ORAL | @ 16:00:00

## 2022-07-11 MED ADMIN — DULoxetine (CYMBALTA) DR capsule 60 mg: 60 mg | ORAL | @ 15:00:00

## 2022-07-11 NOTE — Unmapped (Signed)
VENOUS ACCESS ULTRASOUND PROCEDURE NOTE    Indications:   Poor venous access.    The Venous Access Team has assessed this patient for the placement of a PIV. Ultrasound guidance was necessary to obtain access.     Procedure Details:  Identity of the patient was confirmed via name, medical record number and date of birth. The availability of the correct equipment was verified.    The vein was identified for ultrasound catheter insertion.  Field was prepared with necessary supplies and equipment.  Probe cover and sterile gel utilized.  Insertion site was prepped with chlorhexidine solution and allowed to dry.  The catheter extension was primed with normal saline. A(n) 22 gauge 1.75 catheter was placed in the L Forearm with 1attempt(s). See LDA for additional details.    Catheter aspirated, 4 mL blood return present. The catheter was then flushed with 10 mL of normal saline. Insertion site cleansed, and dressing applied per manufacturer guidelines. The catheter was inserted with difficulty due to poor vasculatureby Michel Santee, RN.      RN was notified.     Thank you,     Michel Santee, RN Venous Access Team   [phone]     Workup / Procedure Time:  30 minutes    See vein image below:

## 2022-07-11 NOTE — Unmapped (Signed)
Endocrine Team Diabetes New Consult Note     Consult information:  Requesting Attending Physician : Florene Glen, MD  Service Requesting Consult : Surg Transplant [phone])  Primary Care Provider: Card, Matthew Picket, MD  Impression:  Matthew Key is a 61 y.o. male admitted for diarrhea. We have been consulted at the request of Matthew Lanney Gins, MD to evaluate Matthew Key for hyperglycemia.     Medical Decision Making:  Diagnoses:  1.Type 2 Diabetes. Uncontrolled With severe hyperglycemia last 24 hours.  2. Nutrition: Complicating glycemic control. Increasing risk for both hypoglycemia and hyperglycemia.  3. Transplant. Complicating glycemic control and increasing risk for hyperglycemia.  4.  Elevated transaminases. Complicating glycemic control and increasing risk for hyperglycemia and hypoglycemia  5. History of secondary adrenal insufficiency.       Studies reviewed 07/11/22:  Labs: CBC, BMP, POCT-BG, and HbA1C  Interpretation: WBC normal. Electrolytes normal. Elevated transaminases. Hyperglycemia, some severe. A1C indicates excellent control at home.     Notes reviewed: Primary team and nursing notes      Overall impression based on above reviews and history:  Will resume home basal dose. Once stable will consolidate to lantus. Has ozempic on board for now which will help with postprandial BG.     Recommendations:  - NPH 6u q12h  - lispro target 140, ISF 30 achs  - Hypoglycemia protocol.  - POCT-BG achs.  - Ensure patient is on glucose precautions if patient taking nutrition by mouth.     Discharge planning:  Will most likely discharge back on home regimen given good control and no hypoglycemia.     Thank you for this consult. Discussed plan with primary team. We will continue to follow and make recommendations and place orders as appropriate.    Please page with questions or concerns: Matthew Gonyea, NP: [phone]  DCT on call from 6AM - 3PM on weekdays then endocrine fellow on call: [phone] from 3PM - 6AM on weekdays and on weekends and holidays.   If APP cannot be reached, please page the endocrine fellow on call.      Subjective:  Initial HPI:  Matthew Key is a 61 y.o. male with a PMH of  has a past medical history of AKI (acute kidney injury) (CMS-HCC) (12/14/2020), Anxiety (10/22/2013), Arthritis, Cervical radiculopathy (12/03/2016), Chronic pain disorder, Cirrhosis (CMS-HCC), Dental abscess (10/2020), Duodenal ulcer (12/01/2017), GERD (gastroesophageal reflux disease), History of transfusion, Hyperlipidemia (10/22/2013), Hypertension, Hypotension (01/29/2022), Liver disease, Sleep apnea, obstructive, Stroke (CMS-HCC), and Type 2 diabetes mellitus with diabetic neuropathy, with long-term current use of insulin (CMS-HCC) (06/09/2014). admitted on 07/10/2022 for an after weekly post transplant labs revealed elevated transaminases and alk phos with persistent diarrhea for the last several weeks without improvement.     Diabetes History:  Patient has a history of Type 2 diabetes diagnosed 2017.  Diabetes is managed by: PCP.  Current home diabetes regimen: tresiba 13 units at bedtime, novolog correction and ozempic 1mg  qweek (Sundays, last dose 11/26).. Of note he was on ozempic before.   Current home blood glucose monitoring:  3-4 times a day.  Hypoglycemia awareness: yes.  Complications related to diabetes: peripheral neuropathy      Endocrine History: History of secondary adrenal insufficiency. Had been receiving steroids steroids for spinal injections. We saw back in January of this year and noted HPA axis recovery based on ACTH stim test (cortisol went from 8.4-> 22.9). Since then he was prescribed hydrocortisone 10mg  every day prn illness, but states he  hasn't taken any. Has not taken any steroids this calendar year (2023).    Current Nutrition:  Active Orders   Diet    Nutrition Therapy Regular/House       ROS: As per HPI.     amlodipine  10 mg Oral Daily    aspirin  81 mg Oral Daily atovaquone  1,500 mg Oral Daily    DULoxetine  60 mg Oral Daily    everolimus (immunosuppressive)  2 mg Oral BID    gabapentin  300 mg Oral TID    insulin regular  0-20 Units Subcutaneous Q6H SCH    magnesium oxide-Mg AA chelate  2 tablet Oral BID    melatonin  3 mg Oral QPM    tacrolimus  3 mg Oral BID       Current Outpatient Medications   Medication Instructions    acetaminophen (TYLENOL) 325-650 mg, Oral, Every 6 hours PRN    amlodipine (NORVASC) 10 mg, Oral, Daily (standard)    amlodipine (NORVASC) 10 mg, Oral, Daily (standard)    aspirin (ECOTRIN) 81 mg, Oral, Daily (standard)    atorvastatin (LIPITOR) 40 mg, Oral, Daily (standard), Hold until directed to take by nurse coordinator    atovaquone (MEPRON) 1,500 mg, Oral, Daily (standard)    blood sugar diagnostic (ACCU-CHEK GUIDE TEST STRIPS) Strp Use to check blood sugar as directed with insulin 3 times a day & for symptoms of high or low blood sugar    blood-glucose meter kit Use as instructed    blood-glucose meter kit Use as instructed    blood-glucose meter kit Use as instructed    blood-glucose meter Misc Use to test blood sugar as directed    docusate sodium (COLACE) 100 mg, Oral, 2 times a day PRN    DULoxetine (CYMBALTA) 60 mg, Oral, Daily (standard)    everolimus (immunosuppressive) (ZORTRESS) 2 mg, Oral, 2 times a day    gabapentin (NEURONTIN) 300 mg, Oral, 3 times a day (standard)    glucagon spray 3 mg/actuation Spry Use 1 spray into the left nostril every fifteen (15) minutes as needed (low blood sugar).    hydrOXYzine (VISTARIL) 50 mg, Oral, Nightly PRN    insulin ASPART (NOVOLOG FLEXPEN) 100 unit/mL (3 mL) injection pen Inject under the skin per sliding scale prior to meals. If premeal BG 151-200 take 2 additional units for correction, if 201-250 take 4 additional units, etc. Max dose 36 units per day.  Store in-use prefilled pens at room temperature <86??F and use within 28 days; do not refrigerate.    lancets (ACCU-CHEK SOFTCLIX LANCETS) Misc Use to check blood sugar as directed with insulin 3 times a day & for symptoms of high or low blood sugar.    magnesium oxide-Mg AA chelate (MAGNESIUM, AMINO ACID CHELATE,) 133 mg 2 tablets, Oral, 2 times a day    melatonin 3 mg, Oral, Every evening    methocarbamoL (ROBAXIN) 500 mg, Oral, 3 times a day PRN    OZEMPIC 0.5 mg, Subcutaneous, Every 7 days    pantoprazole (PROTONIX) 40 mg, Oral, Daily PRN    pen needle, diabetic (BD ULTRA-FINE NANO PEN NEEDLE) 32 gauge x 5/32 (4 mm) Ndle Use as directed  1-2 times per day    polyethylene glycol (MIRALAX) 17 gram/dose powder Mix 17 g (use measure line in cap) in 4-8 ounces of water, juice, soda, coffee, or tea and take by mouth daily as needed.    tacrolimus (PROGRAF) 3 mg, Oral, 2 times a day  TRESIBA FLEXTOUCH U-100 13 Units, Subcutaneous, Daily (standard), Adjust as instructed.    VELTASSA 16.8 g, Oral, Daily (standard)           Past Medical History:   Diagnosis Date    AKI (acute kidney injury) (CMS-HCC) 12/14/2020    Anxiety 10/22/2013    Arthritis     Cervical radiculopathy 12/03/2016    Chronic pain disorder     Lower back    Cirrhosis (CMS-HCC)     Dental abscess 10/2020    Duodenal ulcer 12/01/2017    GERD (gastroesophageal reflux disease)     History of transfusion     Hyperlipidemia 10/22/2013    Hypertension     under control with meds and weight loss    Hypotension 01/29/2022    Liver disease     Sleep apnea, obstructive     Have machine    Stroke (CMS-HCC)     mild stroke    Type 2 diabetes mellitus with diabetic neuropathy, with long-term current use of insulin (CMS-HCC) 06/09/2014       Past Surgical History:   Procedure Laterality Date    KNEE SURGERY      PR CATH PLACE/CORON ANGIO, IMG SUPER/INTERP,R&L HRT CATH, L HRT VENTRIC N/A 12/19/2020    Procedure: CATH LEFT/RIGHT HEART CATHETERIZATION;  Surgeon: Rosana Hoes, MD;  Location: [facility] CATH;  Service: Cardiology    PR COLONOSCOPY FLX DX W/COLLJ SPEC WHEN PFRMD N/A 12/07/2021    Procedure: COLONOSCOPY, FLEXIBLE, PROXIMAL TO SPLENIC FLEXURE; DIAGNOSTIC, W/WO COLLECTION SPECIMEN BY BRUSH OR WASH;  Surgeon: Annie Paras, MD;  [facility]: [facility];  Service: Gastroenterology    PR TRANSPLANT LIVER,ALLOTRANSPLANT N/A 02/06/2022    Procedure: LIVER ALLOTRANSPLANTATION; ORTHOTOPIC, PARTIAL OR WHOLE, FROM CADAVER OR LIVING DONOR, ANY AGE;  Surgeon: Florene Glen, MD;  Location: MAIN OR [place];  Service: Transplant    PR TRANSPLANT,PREP DONOR LIVER/ARTERIAL N/A 02/06/2022    Procedure: BACKBNCH RECONSTRUCT OF CAD/LIVE DONOR LIVER GFT PRIOR TRANSPLANT; ARTERIAL ANASTAMOSIS, EA;  Surgeon: Florene Glen, MD;  Location: MAIN OR [place];  Service: Transplant    PR UPPER GI ENDOSCOPY,BIOPSY N/A 10/12/2020    Procedure: UGI ENDOSCOPY; WITH BIOPSY, SINGLE OR MULTIPLE;  Surgeon: Marene Lenz, MD;  Location: [facility];  Service: Gastroenterology    PR UPPER GI ENDOSCOPY,DIAGNOSIS N/A 01/03/2022    Procedure: UGI ENDO, INCLUDE ESOPHAGUS, STOMACH, & DUODENUM &/OR JEJUNUM; DX W/WO COLLECTION SPECIMN, BY BRUSH OR WASH;  Surgeon: Marene Lenz, MD;  Location: [facility];  Service: Gastroenterology    PR UPPER GI ENDOSCOPY,LIGAT VARIX N/A 12/07/2021    Procedure: UGI ENDO; Everlene Balls LIG ESOPH &/OR GASTRIC VARICES;  Surgeon: Annie Paras, MD;  Location: [facility];  Service: Gastroenterology    ROOT CANAL      Front teeth       Family History   Problem Relation Age of Onset    Edema Mother     Alzheimer's disease Father     Aortic dissection Brother     Early death Brother     Aneurysm Brother        Social History     Tobacco Use    Smoking status: Never    Smokeless tobacco: Never   Vaping Use    Vaping Use: Never used   Substance Use Topics    Alcohol use: Not Currently    Drug use: Not Currently  OBJECTIVE:  BP 168/74  - Pulse 71  - Temp 36.6 ??C (97.9 ??F) (Oral)  - Resp 17  - SpO2 97%   Wt Readings from Last 12 Encounters:   05/06/22 87.5 kg (193 lb)   04/08/22 86.7 kg (191 lb 1.6 oz)   04/08/22 86.7 kg (191 lb 1.6 oz)   03/11/22 86 kg (189 lb 11.2 oz)   02/25/22 88.6 kg (195 lb 4.8 oz)   02/25/22 88.6 kg (195 lb 4.8 oz)   02/18/22 96.3 kg (212 lb 3.2 oz)   02/11/22 94.8 kg (209 lb)   01/29/22 96.9 kg (213 lb 11.2 oz)   01/14/22 89.8 kg (198 lb)   01/03/22 88.9 kg (196 lb)   12/31/21 90.4 kg (199 lb 6.4 oz)     Physical Exam  Constitutional:       Appearance: Normal appearance.   Pulmonary:      Effort: Pulmonary effort is normal. No respiratory distress.   Skin:     General: Skin is warm and dry.   Neurological:      General: No focal deficit present.      Mental Status: He is alert and oriented to person, place, and time.             BG/insulin reviewed per EMR.   Glucose, POC (mg/dL)   Date Value   09/60/4540 181 (H)   04/08/2022 199 (H)   02/27/2022 118   02/15/2022 322 (H)   02/15/2022 101   02/14/2022 114   02/14/2022 223 (H)   02/14/2022 155        Summary of labs:  Lab Results   Component Value Date    A1C 6.5 (H) 05/06/2022    A1C 5.2 02/05/2022    A1C 4.7 (L) 01/14/2022     Lab Results   Component Value Date    CREATININE 1.17 07/11/2022     Lab Results   Component Value Date    WBC 6.8 07/11/2022    HGB 11.9 (L) 07/11/2022    HCT 34.7 (L) 07/11/2022    PLT 148 (L) 07/11/2022       Lab Results   Component Value Date    NA 138 07/11/2022    K 3.8 07/11/2022    CL 104 07/11/2022    CO2 25.0 07/11/2022    BUN 16 07/11/2022    CREATININE 1.17 07/11/2022    GLU 184 (H) 07/11/2022    CALCIUM 9.2 07/11/2022    MG 1.6 07/11/2022    PHOS 4.0 07/11/2022       Lab Results   Component Value Date    BILITOT 0.7 07/11/2022    BILIDIR 0.40 (H) 07/11/2022    PROT 6.6 07/11/2022    ALBUMIN 3.5 07/11/2022    ALT 251 (H) 07/11/2022    AST 172 (H) 07/11/2022    ALKPHOS 349 (H) 07/11/2022    GGT 419 (H) 07/11/2022

## 2022-07-11 NOTE — Unmapped (Signed)
Transplant Surgery History and Physical Note    Assessment/Plan:    Matthew Key is a 61 y.o. male with a PMH of  has a past medical history of AKI (acute kidney injury) (CMS-HCC) (12/14/2020), Anxiety (10/22/2013), Arthritis, Cervical radiculopathy (12/03/2016), Chronic pain disorder, Cirrhosis (CMS-HCC), Dental abscess (10/2020), Duodenal ulcer (12/01/2017), GERD (gastroesophageal reflux disease), History of transfusion, Hyperlipidemia (10/22/2013), Hypertension, Hypotension (01/29/2022), Liver disease, Sleep apnea, obstructive, Stroke (CMS-HCC), and Type 2 diabetes mellitus with diabetic neuropathy, with long-term current use of insulin (CMS-HCC) (06/09/2014). admitted on 07/10/2022 for an after weekly post transplant labs revealed elevated transaminases and alk phos with persistent diarrhea for the last several weeks without improvement.    Plan:  - Admit to [facility], Floor status  - IVF: NS infusion 100 mL/hr  - Diet: Regular Diet  - Imaging: doppler liver STAT (awaiting final read)  - Labs: CBC, BMP, Mag, Phos, LFT, PT + PTT, and GGT, everolimus, tacrolimus, C.diff, GI path panel, O&P, CMV PCR   - Meds: Tylenol, Gabapentin, Robaxin, Correctional insulin, PPI, and Melatonin  - Resume home amlodipine, ASA 81, atorvastatin, atovaquone, duloxetine, everolimus, gabapentin, mag oxide, melatonin, patiromer calcium sorbitex tacrolimus, methocarbamol, pantoprazole  - holding home imodium at this time  - Cr = 1.51 (CTM UOP in precaution of mild AKI)  - enteric precautions while ruling out C.diff      Alcide Clever, MD  Department of General Surgery  Date: 07/11/22  Time: 1:26 AM  Pager: [phone]  Also available via EPIC Chat    Please page Transplant [facility]) pager for any patient concerns or questions.      History of Present Illness:  Chief Complaint:   Elevated LFTs and diarrhea    Matthew Key is a 61 y.o. male with history of  has a past medical history of AKI (acute kidney injury) (CMS-HCC) (12/14/2020), Anxiety (10/22/2013), Arthritis, Cervical radiculopathy (12/03/2016), Chronic pain disorder, Cirrhosis (CMS-HCC), Dental abscess (10/2020), Duodenal ulcer (12/01/2017), GERD (gastroesophageal reflux disease), History of transfusion, Hyperlipidemia (10/22/2013), Hypertension, Hypotension (01/29/2022), Liver disease, Sleep apnea, obstructive, Stroke (CMS-HCC), and Type 2 diabetes mellitus with diabetic neuropathy, with long-term current use of insulin (CMS-HCC) (06/09/2014).  Patient follows weekly with outpatient labs, most recently on Monday which demonstrated elevated alk phos = 372, AST = 266, ALT = 163, although while patient reports being asymptomatic of right upper quadrant pain, nausea, indigestion, or yellowing of the skin/eyes.  Concurrently, patient reports persistent diarrhea for 2-3 weeks unrelieved by use of Imodium.  He was tested outpatient for C. difficile assay and GI pathogen panel which both returned completely normal.  Patient denies associated fever, nausea/vomiting, GI upset, blood in stool, abnormal stool color, excessive dehydration, weakness/fatigue, peripheral edema, confusion, abdominal pain, dysuria, urinary frequency, headache, dizziness, or change in memory.    Of note: Patient mentions that about 2 weeks ago, he reports eating [employer] with his wife and noticed that the chicken and biscuits were not completely cooked.  He denied acute food poisoning symptoms following the meal.  He denies his wife demonstrating acute GI symptoms.    Allergies  Venom-honey bee    Medications    Current Facility-Administered Medications   Medication Dose Route Frequency Provider Last Rate Last Admin    acetaminophen (TYLENOL) tablet 650 mg  650 mg Oral Q6H PRN Alcide Clever B, MD        amlodipine (NORVASC) tablet 10 mg  10 mg Oral Daily Nona Dell, MD        aspirin  chewable tablet 81 mg  81 mg Oral Daily Alcide Clever B, MD        atorvastatin (LIPITOR) tablet 40 mg  40 mg Oral Daily Remedios Mckone, Waymond Cera, MD atovaquone [facility]) oral suspension  1,500 mg Oral Daily Alcide Clever B, MD        dextrose (D10W) 10% bolus 125 mL  12.5 g Intravenous Q10 Min PRN Nona Dell, MD        DULoxetine (CYMBALTA) DR capsule 60 mg  60 mg Oral Daily Alcide Clever B, MD        everolimus (immunosuppressive) (ZORTRESS) tablet 2 mg  2 mg Oral BID Nona Dell, MD        gabapentin (NEURONTIN) capsule 300 mg  300 mg Oral BID Alcide Clever B, MD        glucagon injection 1 mg  1 mg Intramuscular Once PRN Nona Dell, MD        glucose chewable tablet 16 g  16 g Oral Q10 Min PRN Alcide Clever B, MD        insulin regular (HumuLIN,NovoLIN) injection 0-20 Units  0-20 Units Subcutaneous Q6H SCH Uri Covey, Waymond Cera, MD        magnesium oxide-Mg AA chelate (Magnesium Plus Protein) 2 tablet  2 tablet Oral BID Nona Dell, MD        melatonin tablet 3 mg  3 mg Oral QPM Raymie Trani B, MD        methocarbamoL (ROBAXIN) tablet 500 mg  500 mg Oral TID PRN Nona Dell, MD        pantoprazole (Protonix) EC tablet 40 mg  40 mg Oral Daily PRN Nona Dell, MD        patiromer calcium sorbitex (VELTASSA) 16.8 gram powder packet 16.8 g  16.8 g Oral Daily Jerney Baksh B, MD        sodium chloride (NS) 0.9 % infusion  100 mL/hr Intravenous Continuous Trinda Pascal, MD 100 mL/hr at 07/11/22 0027 100 mL/hr at 07/11/22 0027    tacrolimus (PROGRAF) capsule 3 mg  3 mg Oral BID Nona Dell, MD         Past Medical History  Past Medical History:   Diagnosis Date    AKI (acute kidney injury) (CMS-HCC) 12/14/2020    Anxiety 10/22/2013    Arthritis     Cervical radiculopathy 12/03/2016    Chronic pain disorder     Lower back    Cirrhosis (CMS-HCC)     Dental abscess 10/2020    Duodenal ulcer 12/01/2017    GERD (gastroesophageal reflux disease)     History of transfusion     Hyperlipidemia 10/22/2013    Hypertension     under control with meds and weight loss    Hypotension 01/29/2022    Liver disease Sleep apnea, obstructive     Have machine    Stroke (CMS-HCC)     mild stroke    Type 2 diabetes mellitus with diabetic neuropathy, with long-term current use of insulin (CMS-HCC) 06/09/2014     Past Surgical History  Past Surgical History:   Procedure Laterality Date    KNEE SURGERY      PR CATH PLACE/CORON ANGIO, IMG SUPER/INTERP,R&L HRT CATH, L HRT VENTRIC N/A 12/19/2020    Procedure: CATH LEFT/RIGHT HEART CATHETERIZATION;  Surgeon: Rosana Hoes, MD;  Location: [facility] CATH;  Service: Cardiology    PR COLONOSCOPY FLX DX W/COLLJ SPEC WHEN PFRMD N/A 12/07/2021  Procedure: COLONOSCOPY, FLEXIBLE, PROXIMAL TO SPLENIC FLEXURE; DIAGNOSTIC, W/WO COLLECTION SPECIMEN BY BRUSH OR WASH;  Surgeon: Annie Paras, MD;  [facility]: [facility];  Service: Gastroenterology    PR TRANSPLANT LIVER,ALLOTRANSPLANT N/A 02/06/2022    Procedure: LIVER ALLOTRANSPLANTATION; ORTHOTOPIC, PARTIAL OR WHOLE, FROM CADAVER OR LIVING DONOR, ANY AGE;  Surgeon: Florene Glen, MD;  Location: MAIN OR [place];  Service: Transplant    PR TRANSPLANT,PREP DONOR LIVER/ARTERIAL N/A 02/06/2022    Procedure: BACKBNCH RECONSTRUCT OF CAD/LIVE DONOR LIVER GFT PRIOR TRANSPLANT; ARTERIAL ANASTAMOSIS, EA;  Surgeon: Florene Glen, MD;  Location: MAIN OR [place];  Service: Transplant    PR UPPER GI ENDOSCOPY,BIOPSY N/A 10/12/2020    Procedure: UGI ENDOSCOPY; WITH BIOPSY, SINGLE OR MULTIPLE;  Surgeon: Marene Lenz, MD;  Location: [facility];  Service: Gastroenterology    PR UPPER GI ENDOSCOPY,DIAGNOSIS N/A 01/03/2022    Procedure: UGI ENDO, INCLUDE ESOPHAGUS, STOMACH, & DUODENUM &/OR JEJUNUM; DX W/WO COLLECTION SPECIMN, BY BRUSH OR WASH;  Surgeon: Marene Lenz, MD;  Location: [facility];  Service: Gastroenterology    PR UPPER GI ENDOSCOPY,LIGAT VARIX N/A 12/07/2021    Procedure: UGI ENDO; Everlene Balls LIG ESOPH &/OR GASTRIC VARICES;  Surgeon: Annie Paras, MD;  Location: [facility];  Service: Gastroenterology    ROOT CANAL      Front teeth     Family History  Family History   Problem Relation Age of Onset    Edema Mother     Alzheimer's disease Father     Aortic dissection Brother     Early death Brother     Aneurysm Brother      Social History:  Social History     Socioeconomic History    Marital status: Married   Tobacco Use    Smoking status: Never    Smokeless tobacco: Never   Vaping Use    Vaping Use: Never used   Substance and Sexual Activity    Alcohol use: Not Currently    Drug use: Not Currently    Sexual activity: Not Currently     Partners: Female     Birth control/protection: Surgical, Bilateral Tubal Ligation     Comment: wife   Other Topics Concern    Exercise Yes    Living Situation Yes     Comment: Married     Social Determinants of Health     Financial Resource Strain: Low Risk  (08/28/2021)    Overall Financial Resource Strain (CARDIA)     Difficulty of Paying Living Expenses: Not hard at all   Food Insecurity: No Food Insecurity (08/28/2021)    Hunger Vital Sign     Worried About Running Out of Food in the Last Year: Never true     Ran Out of Food in the Last Year: Never true   Transportation Needs: No Transportation Needs (08/28/2021)    PRAPARE - Therapist, art (Medical): No     Lack of Transportation (Non-Medical): No     Review of Systems  A 12 system review of systems was negative except as noted in HPI.      Objective:     Vital Signs  Vitals:    07/11/22 0028   BP: 165/72   Pulse: 73   Resp: 16   Temp: 36.8 ??C (98.2 ??F)   SpO2: 98%     Physical Exam  General: Cooperative, no distress, well appearing  Head: Normocephalic,  atraumatic  Eyes: Pupil equal and round, conjunctiva not injected  Nose: Nares grossly normal, no drainage  Neck: Midline trachea, supple, symmetrical  Pulmonary: Normal work of breathing, equal bilateral chest rise  Cardiovascular: Regular rate and rhythm  Abdomen: Soft, non-tender, non-distended. No hernias appreciated. Well-healed right oblique, curvilinear abdominal surgical scar without surrounding erythema, induration, or drainage  Musculoskeletal: Good range of motion in bilateral upper/lower extremities. Superficial right posterior thigh ecchymosis tender to palpation, no subcutaneous hematoma felt; RLE full ROM. 2+ radial, DP/PT pulses.  Skin: No jaundice, rashes, erythema, or lesions. Skin color and texture normal.  Neurologic: Alert and interactive, no motor abnormalities noted. Sensation grossly intact.    Test Results    Labs:  Recent Results (from the past 24 hour(s))   Comprehensive Metabolic Panel    Collection Time: 07/10/22  9:46 PM   Result Value Ref Range    Sodium 135 135 - 145 mmol/L    Potassium 4.3 3.4 - 4.8 mmol/L    Chloride 103 98 - 107 mmol/L    CO2 28.0 20.0 - 31.0 mmol/L    Anion Gap 4 (L) 5 - 14 mmol/L    BUN 16 9 - 23 mg/dL    Creatinine 1.61 (H) 0.73 - 1.18 mg/dL    BUN/Creatinine Ratio 13     eGFR CKD-EPI (2021) Male 66 >=60 mL/min/1.19m2    Glucose 255 (H) 70 - 179 mg/dL    Calcium 9.7 8.7 - 09.6 mg/dL    Albumin 3.8 3.4 - 5.0 g/dL    Total Protein 7.0 5.7 - 8.2 g/dL    Total Bilirubin 0.7 0.3 - 1.2 mg/dL    AST 045 (H) <=40 U/L    ALT 235 (H) 10 - 49 U/L    Alkaline Phosphatase 332 (H) 46 - 116 U/L   Magnesium Level    Collection Time: 07/10/22  9:46 PM   Result Value Ref Range    Magnesium 1.6 1.6 - 2.6 mg/dL   Phosphorus Level    Collection Time: 07/10/22  9:46 PM   Result Value Ref Range    Phosphorus 3.5 2.4 - 5.1 mg/dL   PT-INR    Collection Time: 07/10/22  9:46 PM   Result Value Ref Range    PT 10.8 9.9 - 12.6 sec    INR 0.96    aPTT    Collection Time: 07/10/22  9:46 PM   Result Value Ref Range    APTT 33.4 24.8 - 38.4 sec    Heparin Correlation 0.2    CBC w/ Differential    Collection Time: 07/10/22  9:46 PM   Result Value Ref Range    WBC 6.7 3.6 - 11.2 10*9/L    RBC 4.57 4.26 - 5.60 10*12/L    HGB 12.3 (L) 12.9 - 16.5 g/dL    HCT 98.1 (L) 19.1 - 48.0 %    MCV 77.8 77.6 - 95.7 fL    MCH 26.9 25.9 - 32.4 pg    MCHC 34.6 32.0 - 36.0 g/dL    RDW 47.8 29.5 - 62.1 %    MPV 9.6 6.8 - 10.7 fL    Platelet 156 150 - 450 10*9/L    Neutrophils % 72.0 %    Lymphocytes % 19.4 %    Monocytes % 7.0 %    Eosinophils % 1.2 %    Basophils % 0.4 %    Absolute Neutrophils 4.8 1.8 - 7.8 10*9/L    Absolute Lymphocytes 1.3 1.1 - 3.6  10*9/L    Absolute Monocytes 0.5 0.3 - 0.8 10*9/L    Absolute Eosinophils 0.1 0.0 - 0.5 10*9/L    Absolute Basophils 0.0 0.0 - 0.1 10*9/L    Microcytosis Slight (A) Not Present    Hypochromasia Slight (A) Not Present       Imaging:  All pertinent imaging personally reviewed.  US Liver Transplant   Preliminary Result   -Patent hepatic transplant vasculature.   -Stable resistive indices in the hepatic transplant arteries, within normal limits.   -Mild intrahepatic biliary ductal dilatation, stable to slightly increased from prior.      Please see below for data measurements:         Liver: 16 cm      Common bile duct: 1.0 cm      Right kidney: 10 cm   Left kidney: 10.2 cm      Main portal vein diameter: 0.9 cm      Main portal vein pre anastomosis velocity: 0.26 m/s   Main portal vein anastomosis velocity: 0.37 m/s   Main portal vein post anastomosis velocity: 0.27 m/s   Anterior right portal vein velocity: 0.32 m/s   Posterior right portal vein velocity: 0.27 m/s   Left portal vein velocity: 0.24 m/s      Right portal vein flow: hepatopetal   Left portal vein flow: hepatopetal      Common hepatic artery resistive index: 0.79 (previously 0.80) and systolic acceleration time 20 msec   Right hepatic artery resistive index: 0.68 (previously 0.71) and systolic acceleration time 40 msec   Left hepatic artery resistive index: 0.69 (previously 0.72) and systolic acceleration time 30 msec      Left hepatic vein flow: triphasic   Middle hepatic vein flow: biphasic   Right hepatic vein flow: biphasic   Inferior vena cava flow: biphasic      Splenic vein midline: hepatopetal Splenic vein proximal: hepatopetal       Aorta: partially visualized   Inferior vena cava: Partially visualized      Spleen: 11.5 cm      Abdominal free fluid visualized: no

## 2022-07-11 NOTE — Unmapped (Signed)
Patient's 11/28 lfts significantly elevated. Spoke to patient, who reports his only sx is diarrhea, which is resolving. He denies nausea, fever, chills or abdominal pain and said he has not started any other medications besides the imodium. Prepared him for potential admission.    Spoke to Dr.Desai, who recommended patient be admitted for observation and workup. [facility] beds are available on , so arranged same-day admission. Notified patient to expect call for admission today. He verbalized understanding.

## 2022-07-11 NOTE — Unmapped (Signed)
Problem: Adult Inpatient Plan of Care  Goal: Plan of Care Review  Outcome: Progressing  Goal: Patient-Specific Goal (Individualized)  Outcome: Progressing  Goal: Absence of Hospital-Acquired Illness or Injury  Outcome: Progressing  Intervention: Identify and Manage Fall Risk  Recent Flowsheet Documentation  Taken 07/11/2022 0200 by Sabino Snipes, RN  Safety Interventions:   environmental modification   fall reduction program maintained   lighting adjusted for tasks/safety   low bed   nonskid shoes/slippers when out of bed  Taken 07/11/2022 0000 by Sabino Snipes, RN  Safety Interventions:   environmental modification   fall reduction program maintained   lighting adjusted for tasks/safety   low bed   nonskid shoes/slippers when out of bed  Intervention: Prevent Skin Injury  Recent Flowsheet Documentation  Taken 07/11/2022 0200 by Sabino Snipes, RN  Positioning for Skin: Left  Taken 07/11/2022 0000 by Sabino Snipes, RN  Positioning for Skin: Supine/Back  Device Skin Pressure Protection: adhesive use limited  Skin Protection: adhesive use limited  Goal: Optimal Comfort and Wellbeing  Outcome: Progressing  Goal: Readiness for Transition of Care  Outcome: Progressing  Goal: Rounds/Family Conference  Outcome: Progressing

## 2022-07-11 NOTE — Unmapped (Incomplete)
Pharmacist Discharge Note for  Living kidney Transplant Recipient  Date of admission to [facility]: 07/10/2022  Reason for writing this note: patient requires medication-related outpatient intervention and/or monitoring    Reason for Admission: weekly post-transplant labs demonstrating elevated transaminases and alk phos with persistent diarrhea for the last several weeks without improvement.    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, DBD  PMH: CVA, HLD, OSA on CPAP, T2M, HTN    Discharge Date:     Past Medical History:   Diagnosis Date    AKI (acute kidney injury) (CMS-HCC) 12/14/2020    Anxiety 10/22/2013    Arthritis     Cervical radiculopathy 12/03/2016    Chronic pain disorder     Lower back    Cirrhosis (CMS-HCC)     Dental abscess 10/2020    Duodenal ulcer 12/01/2017    GERD (gastroesophageal reflux disease)     History of transfusion     Hyperlipidemia 10/22/2013    Hypertension     under control with meds and weight loss    Hypotension 01/29/2022    Liver disease     Sleep apnea, obstructive     Have machine    Stroke (CMS-HCC)     mild stroke    Type 2 diabetes mellitus with diabetic neuropathy, with long-term current use of insulin (CMS-HCC) 06/09/2014       Immunosuppression regimen:  Tacrolimus 3 mg BID (dose as of morning of X); goal 3-5 ng/mL  Everolimus 2 mg BID  (dose as of morning of X); goal 3-8 ng/mL    Antimicrobials during admission:   CMV: D+/R+ -> Valcyte x 3 months (completed EOT 05/09/22)  PJP: Atovaquone 1500 mg daily prior to admission  Associated with high incidence of diarrhea. On 11/30 decision was made to stop atovaquone and switch patient to inhaled pentamidine x 1 dose, which should cover pt until 08/10/22 (completing PCP PPX course)    Medication changes to be instituted upon discharge:  Other Medications on Discharge:      Home medications stopped:   Atovaquone 1500 mg daily    Medication related barriers: N/A      Suggested monitoring for outpatient follow-up:     #Labs  Recommend Tac trough, BMP/CMP, CBC w or w/o differential, LFT's    Pete Glatter, PharmD  PGY-2 Solid Organ Transplant Pharmacy Resident

## 2022-07-12 LAB — COMPREHENSIVE METABOLIC PANEL
ALBUMIN: 3.5 g/dL (ref 3.4–5.0)
ALKALINE PHOSPHATASE: 335 U/L — ABNORMAL HIGH (ref 46–116)
ALT (SGPT): 185 U/L — ABNORMAL HIGH (ref 10–49)
ANION GAP: 8 mmol/L (ref 5–14)
AST (SGOT): 59 U/L — ABNORMAL HIGH (ref <=34)
BILIRUBIN TOTAL: 0.4 mg/dL (ref 0.3–1.2)
BLOOD UREA NITROGEN: 13 mg/dL (ref 9–23)
BUN / CREAT RATIO: 13
CALCIUM: 8.9 mg/dL (ref 8.7–10.4)
CHLORIDE: 106 mmol/L (ref 98–107)
CO2: 26 mmol/L (ref 20.0–31.0)
CREATININE: 1.02 mg/dL
EGFR CKD-EPI (2021) MALE: 83 mL/min/{1.73_m2} (ref >=60)
GLUCOSE RANDOM: 150 mg/dL (ref 70–179)
POTASSIUM: 4 mmol/L (ref 3.4–4.8)
PROTEIN TOTAL: 6.6 g/dL (ref 5.7–8.2)
SODIUM: 140 mmol/L (ref 135–145)

## 2022-07-12 LAB — TACROLIMUS LEVEL, TIMED: TACROLIMUS BLOOD: 6.5 ng/mL

## 2022-07-12 LAB — BILIRUBIN, DIRECT: BILIRUBIN DIRECT: 0.2 mg/dL (ref 0.00–0.30)

## 2022-07-12 LAB — CMV DNA, QUANTITATIVE, PCR: CMV VIRAL LD: NOT DETECTED

## 2022-07-12 LAB — CBC
HEMATOCRIT: 35.6 % — ABNORMAL LOW (ref 39.0–48.0)
HEMOGLOBIN: 12 g/dL — ABNORMAL LOW (ref 12.9–16.5)
MEAN CORPUSCULAR HEMOGLOBIN CONC: 33.8 g/dL (ref 32.0–36.0)
MEAN CORPUSCULAR HEMOGLOBIN: 26.4 pg (ref 25.9–32.4)
MEAN CORPUSCULAR VOLUME: 78 fL (ref 77.6–95.7)
MEAN PLATELET VOLUME: 9.3 fL (ref 6.8–10.7)
PLATELET COUNT: 146 10*9/L — ABNORMAL LOW (ref 150–450)
RED BLOOD CELL COUNT: 4.56 10*12/L (ref 4.26–5.60)
RED CELL DISTRIBUTION WIDTH: 13.2 % (ref 12.2–15.2)
WBC ADJUSTED: 5.9 10*9/L (ref 3.6–11.2)

## 2022-07-12 LAB — EVEROLIMUS
EVEROLIMUS LEVEL: 6.8 ng/mL (ref 3.0–8.0)
EVEROLIMUS LEVEL: 8.6 ng/mL (ref 3.0–15.0)

## 2022-07-12 LAB — GAMMA GT: GAMMA GLUTAMYL TRANSFERASE: 359 U/L — ABNORMAL HIGH

## 2022-07-12 LAB — PHOSPHORUS: PHOSPHORUS: 3.6 mg/dL (ref 2.4–5.1)

## 2022-07-12 LAB — MAGNESIUM: MAGNESIUM: 1.7 mg/dL (ref 1.6–2.6)

## 2022-07-12 MED ORDER — EVEROLIMUS (IMMUNOSUPPRESSIVE) 0.5 MG TABLET
ORAL_TABLET | ORAL | 11 refills | 30 days | Status: CP
Start: 2022-07-12 — End: 2023-07-12
  Filled 2022-08-07: qty 180, 30d supply, fill #0

## 2022-07-12 MED ORDER — TACROLIMUS 1 MG CAPSULE, IMMEDIATE-RELEASE
ORAL_CAPSULE | ORAL | 11 refills | 36 days | Status: CP
Start: 2022-07-12 — End: ?
  Filled 2022-08-07: qty 150, 30d supply, fill #0

## 2022-07-12 MED ADMIN — pentamidine (PENTAM) in sterile water nebulizer solution: 300 mg | RESPIRATORY_TRACT | @ 16:00:00 | Stop: 2022-07-12

## 2022-07-12 MED ADMIN — gabapentin (NEURONTIN) capsule 300 mg: 300 mg | ORAL | @ 14:00:00

## 2022-07-12 MED ADMIN — DULoxetine (CYMBALTA) DR capsule 60 mg: 60 mg | ORAL | @ 14:00:00

## 2022-07-12 MED ADMIN — magnesium sulfate 2gm/50mL IVPB: 2 g | INTRAVENOUS | @ 14:00:00

## 2022-07-12 MED ADMIN — gabapentin (NEURONTIN) capsule 300 mg: 300 mg | ORAL | @ 19:00:00

## 2022-07-12 MED ADMIN — amlodipine (NORVASC) tablet 10 mg: 10 mg | ORAL | @ 14:00:00

## 2022-07-12 MED ADMIN — insulin lispro (HumaLOG) injection 0-20 Units: 0-20 [IU] | SUBCUTANEOUS | @ 03:00:00

## 2022-07-12 MED ADMIN — tacrolimus (PROGRAF) capsule 2 mg: 2 mg | ORAL | @ 23:00:00

## 2022-07-12 MED ADMIN — aspirin chewable tablet 81 mg: 81 mg | ORAL | @ 14:00:00

## 2022-07-12 MED ADMIN — insulin lispro (HumaLOG) injection 0-20 Units: 0-20 [IU] | SUBCUTANEOUS | @ 22:00:00

## 2022-07-12 MED ADMIN — insulin NPH (HumuLIN,NovoLIN) injection 5 Units: 5 [IU] | SUBCUTANEOUS | @ 03:00:00

## 2022-07-12 MED ADMIN — methocarbamoL (ROBAXIN) tablet 500 mg: 500 mg | ORAL | @ 03:00:00

## 2022-07-12 MED ADMIN — acetaminophen (TYLENOL) tablet 650 mg: 650 mg | ORAL | @ 03:00:00

## 2022-07-12 MED ADMIN — sodium chloride (NS) 0.9 % infusion: 50 mL/h | INTRAVENOUS | @ 14:00:00

## 2022-07-12 MED ADMIN — tacrolimus (PROGRAF) capsule 3 mg: 3 mg | ORAL | @ 01:00:00

## 2022-07-12 MED ADMIN — everolimus (immunosuppressive) (ZORTRESS) tablet 1 mg: 1 mg | ORAL | @ 23:00:00

## 2022-07-12 MED ADMIN — insulin lispro (HumaLOG) injection 0-20 Units: 0-20 [IU] | SUBCUTANEOUS | @ 17:00:00

## 2022-07-12 MED ADMIN — everolimus (immunosuppressive) (ZORTRESS) tablet 2 mg: 2 mg | ORAL | @ 14:00:00 | Stop: 2022-07-12

## 2022-07-12 MED ADMIN — gabapentin (NEURONTIN) capsule 300 mg: 300 mg | ORAL | @ 01:00:00

## 2022-07-12 MED ADMIN — tacrolimus (PROGRAF) capsule 3 mg: 3 mg | ORAL | @ 14:00:00 | Stop: 2022-07-12

## 2022-07-12 MED ADMIN — insulin NPH (HumuLIN,NovoLIN) injection 6 Units: 6 [IU] | SUBCUTANEOUS | @ 14:00:00

## 2022-07-12 MED ADMIN — melatonin tablet 3 mg: 3 mg | ORAL | @ 01:00:00

## 2022-07-12 MED ADMIN — everolimus (immunosuppressive) (ZORTRESS) tablet 2 mg: 2 mg | ORAL | @ 01:00:00

## 2022-07-12 MED ADMIN — albuterol 2.5 mg /3 mL (0.083 %) nebulizer solution: @ 16:00:00 | Stop: 2022-07-12

## 2022-07-12 NOTE — Unmapped (Signed)
Transplant Surgery Progress Note    Hospital Day: 3    Assessment:     Matthew Key is a 61 y.o. 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 Celine Mans. He was admitted on 07/10/2022 after weekly post transplant labs revealed elevated transaminases and alk phos with persistent diarrhea for the last several weeks without improvement.     Interval Events:     No acute events overnight. Pain is well controlled.  Vital signs are stable. Urine output is appropriate.  Patient having bowel movements. Continues to have diarrhea. Liver enzymes downtrending. Continues to endorse pain in right hip after fall.    Plan:     Neuro:  - Pain well controlled  - Home gabapentin, duloxetine, APAP PRN, robaxin PRN    CV:  - HDS, Maintain SBP < 180  - Home amlodipine, ASA    Pulm:  - Stable on room air    GI:  - F: 0.9% NS at 84mL/hr  - E: Replete as needed  - N: regular diet  - pantoprazole  - IV mag 2g daily  - Holding loperamide       GU:  - Voiding spontaneously    Endo:  - endocrine consulted, appreciate recs  - NPH 6U q12h  - lispro target 140, ISF 30 achs  - hypoglycemia protocol  - POCT-BG achs     Heme/ID:  - Afebrile, WBC and Hgb stable  - ppx with pentamidine (hold atovaquone)  - C. Diff, GI Path panel negative  - CMV, O&P pending  - CXR    MSK:  - PT consult for R hip pain    Immuno:  - tac, everolimus  - pharmacy recommendations for dosing      Dispo  - Floor      Objective:        Vital Signs:  BP 134/60  - Pulse 78  - Temp 36.4 ??C (97.5 ??F)  - Resp 16  - SpO2 95%     Input/Output:  I/O last 3 completed shifts:  In: 618.3 [P.O.:240; I.V.:378.3]  Out: -     Physical Exam:    General: Cooperative, no distress, well appearing male  Pulmonary: Normal work of breathing, on room air  Cardiovascular: Regular rate  Abdomen: Soft, non-tender, non-distended. Surgical incision well-healed.   Musculoskeletal: Moves extremities spontaneously   Neurologic: Alert and interactive, grossly intact    Labs:  Lab Results   Component Value Date    WBC 5.9 07/12/2022    HGB 12.0 (L) 07/12/2022    HCT 35.6 (L) 07/12/2022    PLT 146 (L) 07/12/2022       Lab Results   Component Value Date    NA 140 07/12/2022    K 4.0 07/12/2022    CL 106 07/12/2022    CO2 26.0 07/12/2022    BUN 13 07/12/2022    CREATININE 1.02 07/12/2022    CALCIUM 8.9 07/12/2022    MG 1.7 07/12/2022    PHOS 3.6 07/12/2022       Microbiology Results (last day)       Procedure Component Value Date/Time Date/Time    C. Difficile Assay [[id number]]  (Normal) Collected: 07/11/22 0603    Lab Status: Final result Specimen: Stool  Updated: 07/11/22 0843     C. Diff Result Negative     Comment: Clostridium difficile NOT detected       Narrative:  The methodology of this test detects C. difficile toxin A and/or toxin B, by EIA.        GI Pathogen Panel [[id number]] Collected: 07/11/22 0603    Lab Status: In process Specimen: Stool  Updated: 07/11/22 0640    Comprehensive Ova and Parasite Exam [[id number]] Collected: 07/11/22 0605    Lab Status: In process Specimen: Stool  Updated: 07/11/22 0640            Imaging:  All pertinent imaging personally reviewed.    Montez Hageman, MD  PGY-1, General Surgery  [facility]

## 2022-07-12 NOTE — Unmapped (Signed)
Care Management  Initial Transition Planning Assessment              General  Orientation Level: Oriented X4    Contact/Decision Maker  Extended Emergency Contact Information  Primary Emergency Contact: Iwata,Sherry  Mobile Phone: [phone]  Relation: Spouse  Secondary Emergency Contact: Mangels,Markeem [place]  Mobile Phone: [phone]  Relation: Son    Legal Next of Kin / Guardian / POA / Advance Directives     HCDM (patient stated preference): Patnode,Sherry - Spouse - [phone]    HCDM, back-up (If primary HCDM is unavailable): Desmond, Mcewan - Son [phone]    Advance Directive (Medical Treatment)  Does patient have an advance directive covering medical treatment?: Patient does not have advance directive covering medical treatment.  Reason patient does not have an advance directive covering medical treatment:: Patient does not wish to complete one at this time.    Health Care Decision Maker [HCDM] (Medical & Mental Health Treatment)  Healthcare Decision Maker: Patient does not wish to appoint a Health Care Decision Maker at this time  Information offered on HCDM, Medical & Mental Health advance directives:: Patient declined information.    Advance Directive (Mental Health Treatment)  Does patient have an advance directive covering mental health treatment?: Patient does not have advance directive covering mental health treatment.  Reason patient does not have an advance directive covering mental health treatment:: Patient does not wish to complete one at this time.    Readmission Information         Did the following happen with your discharge?          Patient Information  Lives with: Spouse/significant other    Type of Residence: Private residence          Support Systems/Concerns: Case Manager/Social Worker         Home Care services in place prior to admission?: No          Financial Information         Social Determinants of Health  Social Determinants of Health were addressed in provider documentation.  Please refer to patient history.    Complex Discharge Information    Is patient identified as a difficult/complex discharge?: Yes        Other: s/p liver transplant    Interventions:       Discharge Needs Assessment  Concerns to be Addressed:      Clinical Risk Factors:           Prior overnight hospital stay or ED visit in last 90 days: Yes       Discharge Facility/Level of Care Needs:      Readmission  Risk of Unplanned Readmission Score: UNPLANNED READMISSION SCORE: 35.7%  Predictive Model Details          36% (High)  Factor Value    Calculated 07/12/2022 12:06 22% Number of ED visits in last six months 6    [place] Risk of Unplanned Readmission Model 17% Number of active inpatient medication orders 37     13% Number of hospitalizations in last year 4     6% ECG/EKG order present in last 6 months     6% Charlson Comorbidity Index 8     5% Encounter of ten days or longer in last year present     5% Diagnosis of electrolyte disorder present     5% Imaging order present in last 6 months     4% Latest hemoglobin low (12.0 g/dL)  4% Phosphorous result present     3% Age 61     3% Diagnosis of deficiency anemia present     3% Diagnosis of renal failure present     1% Future appointment scheduled     1% Current length of stay 1.635 days     1% Active ulcer inpatient medication order present      Readmitted Within the Last 30 Days? (No if blank)        Discharge Plan       Expected Discharge Date: 07/12/2022    Expected Transfer from Critical Care:

## 2022-07-12 NOTE — Unmapped (Signed)
Everolimus Therapeutic Monitoring Pharmacy Note     Matthew Key is a 61 y.o. male continuing tacrolimus.      Indication: Liver transplant      Date of Transplant:  02/06/22       Prior Dosing Information: Home regimen everolimus 2 mg BID    Source(s) of information used to determine prior to admission dosing: Home Medication List     Goals:  Therapeutic Drug Levels  Everolimus goal: 3-8 ng/mL     Additional Clinical Monitoring/Outcomes  Monitor renal function (SCr and urine output) and liver function (LFTs)  Monitor for signs/symptoms of adverse events (e.g., hyperglycemia, hyperkalemia, hypomagnesemia, hypertension, headache, tremor)     Results:   Everolimus level: 8.6 ng/mL, drawn appropriately     Pharmacokinetic Considerations and Significant Drug Interactions:  Concurrent hepatotoxic medications: None identified  Concurrent CYP3A4 substrates/inhibitors: None identified  Concurrent nephrotoxic medications:  tacrolimus     Assessment/Plan:  Recommendedation(s)  Decrease to everolimus 2 mg in AM and 1 mg in PM  Continue to monitor for improvement in diarrhea     Follow-up  Tac level ordered every other day, next level due on 12/2 .   A pharmacist will continue to monitor and recommend levels as appropriate     Please page service pharmacist with questions/clarifications.     [name], PharmD

## 2022-07-12 NOTE — Unmapped (Signed)
Problem: Adult Inpatient Plan of Care  Goal: Plan of Care Review  Outcome: Progressing  Goal: Patient-Specific Goal (Individualized)  Outcome: Progressing  Goal: Absence of Hospital-Acquired Illness or Injury  Outcome: Progressing  Intervention: Identify and Manage Fall Risk  Recent Flowsheet Documentation  Taken 07/12/2022 0000 by Sabino Snipes, RN  Safety Interventions:   environmental modification   fall reduction program maintained   lighting adjusted for tasks/safety   low bed  Taken 07/11/2022 2200 by Sabino Snipes, RN  Safety Interventions:   environmental modification   fall reduction program maintained   lighting adjusted for tasks/safety   low bed   nonskid shoes/slippers when out of bed  Taken 07/11/2022 2000 by Sabino Snipes, RN  Safety Interventions:   environmental modification   fall reduction program maintained   lighting adjusted for tasks/safety   low bed   nonskid shoes/slippers when out of bed  Intervention: Prevent Skin Injury  Recent Flowsheet Documentation  Taken 07/12/2022 0000 by Sabino Snipes, RN  Positioning for Skin: Supine/Back  Taken 07/11/2022 2200 by Sabino Snipes, RN  Positioning for Skin: Left  Taken 07/11/2022 2000 by Sabino Snipes, RN  Positioning for Skin: Supine/Back  Device Skin Pressure Protection: adhesive use limited  Skin Protection: adhesive use limited  Goal: Optimal Comfort and Wellbeing  Outcome: Progressing  Goal: Readiness for Transition of Care  Outcome: Progressing  Goal: Rounds/Family Conference  Outcome: Progressing     Problem: Infection  Goal: Absence of Infection Signs and Symptoms  Outcome: Progressing

## 2022-07-12 NOTE — Unmapped (Signed)
Tacrolimus Therapeutic Monitoring Pharmacy Note    Matthew Key is a 61 y.o. male continuing tacrolimus.     Indication: Liver transplant     Date of Transplant:  02/06/22       Prior Dosing Information: Home regimen tac 3 mg BID      Source(s) of information used to determine prior to admission dosing: Home Medication List    Goals:  Therapeutic Drug Levels  Tacrolimus trough goal: 3-5 ng/mL    Additional Clinical Monitoring/Outcomes  Monitor renal function (SCr and urine output) and liver function (LFTs)  Monitor for signs/symptoms of adverse events (e.g., hyperglycemia, hyperkalemia, hypomagnesemia, hypertension, headache, tremor)    Results:   Tacrolimus level:  6.5 ng/mL, drawn appropriately    Pharmacokinetic Considerations and Significant Drug Interactions:  Concurrent hepatotoxic medications: None identified  Concurrent CYP3A4 substrates/inhibitors: None identified  Concurrent nephrotoxic medications:  everolimus    Assessment/Plan:  Recommendedation(s)  Decrease to tac 3 mg in AM and 2 mg in PM  Continue to monitor for improvement in diarrhea    Follow-up  Tac level ordered every other day, next level due on 12/2 .   A pharmacist will continue to monitor and recommend levels as appropriate    Please page service pharmacist with questions/clarifications.    Matthew Key, PharmD

## 2022-07-12 NOTE — Unmapped (Cosign Needed)
Transplant Surgery Progress Note    Hospital Day: 2    Assessment:     Matthew Key is a 62 y.o. 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 Celine Mans. He was admitted on 07/10/2022 after weekly post transplant labs revealed elevated transaminases and alk phos with persistent diarrhea for the last several weeks without improvement.     Interval Events:     No acute events overnight. Pain is well controlled.  Vital signs are stable. Urine output is appropriate. LFTs largely stable with Dbili 0.4 from 0.3. He reports a fall onto his right hip one week ago with ongoing pain.    Plan:     Neuro:   - Pain well controlled   - Home gabapentin, duloxetine, APAP PRN, robaxin PRN    CV:   - HDS, Maintain SBP < 180   - Home amlodipine, ASA    Pulm:   - Stable on room air    GI:   - F: 0.9% NS at 157mL/hr   - E: Replete as needed   - N: regular diet   - pantoprazole   - PO mag > IV   - Holding loperamide    GU:   - Voiding spontaneously    Endo:   -glucose checks ACHS    -SSI    Heme/ID:   - Afebrile, WBC and Hgb stable   - ppx with pentamidine (hold atovaquone)   - C. Diff, GI Path panel, CMV, stool O/P   - CXR    MSK:   - RLE xrays    Immuno:   - tac, everolimus  - pharmacy recommendations for dosing      Dispo   - Floor      Objective:        Vital Signs:  BP 161/70  - Pulse 71  - Temp 35.8 ??C (96.5 ??F)  - Resp 18  - SpO2 93%     Input/Output:  I/O last 3 completed shifts:  In: 378.3 [I.V.:378.3]  Out: -     Physical Exam:    General: Cooperative, no distress, well appearing male  Pulmonary: Normal work of breathing, on room air  Cardiovascular: Regular rate  Abdomen: Soft, non-tender, non-distended. Surgical incision well-healed.   Musculoskeletal: Moves extremities spontaneously   Neurologic: Alert and interactive, grossly intact    Labs:  Lab Results   Component Value Date    WBC 6.8 07/11/2022    HGB 11.9 (L) 07/11/2022    HCT 34.7 (L) 07/11/2022    PLT 148 (L) 07/11/2022       Lab Results   Component Value Date    NA 138 07/11/2022    K 3.8 07/11/2022    CL 104 07/11/2022    CO2 25.0 07/11/2022    BUN 16 07/11/2022    CREATININE 1.17 07/11/2022    CALCIUM 9.2 07/11/2022    MG 1.6 07/11/2022    PHOS 4.0 07/11/2022       Microbiology Results (last day)       Procedure Component Value Date/Time Date/Time    C. Difficile Assay [[id number]]  (Normal) Collected: 07/11/22 0603    Lab Status: Final result Specimen: Stool  Updated: 07/11/22 0843     C. Diff Result Negative     Comment: Clostridium difficile NOT detected       Narrative:      The methodology of this test detects C. difficile  toxin A and/or toxin B, by EIA.        GI Pathogen Panel [[id number]] Collected: 07/11/22 0603    Lab Status: In process Specimen: Stool  Updated: 07/11/22 0640    Comprehensive Ova and Parasite Exam [[id number]] Collected: 07/11/22 0605    Lab Status: In process Specimen: Stool  Updated: 07/11/22 0640            Imaging:  All pertinent imaging personally reviewed.

## 2022-07-12 NOTE — Unmapped (Signed)
Endocrine Team Diabetes Follow Up Consult Note     Consult information:  Requesting Attending Physician : Florene Glen, MD  Service Requesting Consult : Surg Transplant [phone])  Primary Care Provider: Card, [name], MD  Impression:  Matthew Key is a 61 y.o. male admitted for diarrhea. We have been consulted at the request of Chirag Lanney Gins, MD to evaluate Matthew Key for hyperglycemia.     Medical Decision Making:  Diagnoses:  1.Type 2 Diabetes. Uncontrolled With severe hyperglycemia last 24 hours.  2. Nutrition: Complicating glycemic control. Increasing risk for both hypoglycemia and hyperglycemia.  3. Transplant. Complicating glycemic control and increasing risk for hyperglycemia.  4.  Elevated transaminases. Complicating glycemic control and increasing risk for hyperglycemia and hypoglycemia  5. History of secondary adrenal insufficiency.       Studies reviewed 07/12/22:  Labs: CBC, BMP, POCT-BG, and HbA1C  Interpretation: WBC normal. Electrolytes normal. Elevated transaminases. Hyperglycemia, some severe. A1C indicates excellent control at home.     Notes reviewed: Primary team and nursing notes      Overall impression based on above reviews and history:  Severe hyperglycemia after delayed basal dose. Has basal on board now and BG did improve.  Will increase slightly closer to home dose. As noted he has ozempic on board till Sunday which should help with postprandial BG. Will follow closely today and add nutritional insulin if warranted.     Recommendations:  - NPH 6u q12h  - lispro target 140, ISF 30 achs  - Hypoglycemia protocol.  - POCT-BG achs.  - Ensure patient is on glucose precautions if patient taking nutrition by mouth.     Discharge planning:  Will most likely discharge back on home regimen given good control and no hypoglycemia. Discharge med rec updated 12/1.    Thank you for this consult. Discussed plan with primary team. We will continue to follow and make recommendations and place orders as appropriate.    Please page with questions or concerns: Garreth Burnsworth, NP: [phone]  DCT on call from 6AM - 3PM on weekdays then endocrine fellow on call: [phone] from 3PM - 6AM on weekdays and on weekends and holidays.   If APP cannot be reached, please page the endocrine fellow on call.      Subjective:  Interval history: BG doing well. States he is eating ok.  Continues to have diarrhea. Denies complaints at time of rounds.      Initial HPI:  Matthew Key is a 61 y.o. male with a PMH of  has a past medical history of AKI (acute kidney injury) (CMS-HCC) (12/14/2020), Anxiety (10/22/2013), Arthritis, Cervical radiculopathy (12/03/2016), Chronic pain disorder, Cirrhosis (CMS-HCC), Dental abscess (10/2020), Duodenal ulcer (12/01/2017), GERD (gastroesophageal reflux disease), History of transfusion, Hyperlipidemia (10/22/2013), Hypertension, Hypotension (01/29/2022), Liver disease, Sleep apnea, obstructive, Stroke (CMS-HCC), and Type 2 diabetes mellitus with diabetic neuropathy, with long-term current use of insulin (CMS-HCC) (06/09/2014). admitted on 07/10/2022 for an after weekly post transplant labs revealed elevated transaminases and alk phos with persistent diarrhea for the last several weeks without improvement.     Diabetes History:  Patient has a history of Type 2 diabetes diagnosed 2017.  Diabetes is managed by: PCP.  Current home diabetes regimen: tresiba 13 units at bedtime, novolog correction and ozempic 1mg  qweek (Sundays, last dose 11/26).. Of note he was on ozempic before.   Current home blood glucose monitoring:  3-4 times a day.  Hypoglycemia awareness: yes.  Complications related to diabetes: peripheral neuropathy  Endocrine History: History of secondary adrenal insufficiency. Had been receiving steroids steroids for spinal injections. We saw back in January of this year and noted HPA axis recovery based on ACTH stim test (cortisol went from 8.4-> 22.9). Since then he was prescribed hydrocortisone 10mg  every day prn illness, but states he hasn't taken any. Has not taken any steroids this calendar year (2023).    Current Nutrition:  Active Orders   Diet    Nutrition Therapy Regular/House       ROS: As per HPI.     amlodipine  10 mg Oral Daily    aspirin  81 mg Oral Daily    DULoxetine  60 mg Oral Daily    everolimus (immunosuppressive)  2 mg Oral BID    gabapentin  300 mg Oral TID    insulin lispro  0-20 Units Subcutaneous ACHS    insulin NPH  5 Units Subcutaneous Q12H    magnesium sulfate  2 g Intravenous Daily    melatonin  3 mg Oral QPM    pentamidine (PENTAM) in sterile water nebulizer solution  300 mg Nebulization Q28 Days    tacrolimus  3 mg Oral BID       Current Outpatient Medications   Medication Instructions    acetaminophen (TYLENOL) 325-650 mg, Oral, Every 6 hours PRN    amlodipine (NORVASC) 10 mg, Oral, Daily (standard)    aspirin (ECOTRIN) 81 mg, Oral, Daily (standard)    atorvastatin (LIPITOR) 40 mg, Oral, Daily (standard), Hold until directed to take by nurse coordinator    atovaquone (MEPRON) 1,500 mg, Oral, Daily (standard)    blood sugar diagnostic (ACCU-CHEK GUIDE TEST STRIPS) Strp Use to check blood sugar as directed with insulin 3 times a day & for symptoms of high or low blood sugar    blood-glucose meter kit Use as instructed    blood-glucose meter kit Use as instructed    blood-glucose meter kit Use as instructed    blood-glucose meter Misc Use to test blood sugar as directed    DULoxetine (CYMBALTA) 60 mg, Oral, Daily (standard)    everolimus (immunosuppressive) (ZORTRESS) 2 mg, Oral, 2 times a day    gabapentin (NEURONTIN) 300 mg, Oral, 3 times a day (standard)    glucagon spray 3 mg/actuation Spry Use 1 spray into the left nostril every fifteen (15) minutes as needed (low blood sugar).    hydrOXYzine (VISTARIL) 50 mg, Oral, Nightly PRN    insulin ASPART (NOVOLOG FLEXPEN) 100 unit/mL (3 mL) injection pen Inject under the skin per sliding scale prior to meals. If premeal BG 151-200 take 2 additional units for correction, if 201-250 take 4 additional units, etc. Max dose 36 units per day.  Store in-use prefilled pens at room temperature <86??F and use within 28 days; do not refrigerate.    lancets (ACCU-CHEK SOFTCLIX LANCETS) Misc Use to check blood sugar as directed with insulin 3 times a day & for symptoms of high or low blood sugar.    magnesium oxide-Mg AA chelate (MAGNESIUM, AMINO ACID CHELATE,) 133 mg 2 tablets, Oral, 2 times a day    melatonin 3 mg, Oral, Every evening    methocarbamoL (ROBAXIN) 500 mg, Oral, 3 times a day PRN    OZEMPIC 0.5 mg, Subcutaneous, Every 7 days    pantoprazole (PROTONIX) 40 mg, Oral, Daily PRN    patiromer calcium sorbitex (VELTASSA) 16.8 g, Oral, 2 times a day    pen needle, diabetic (BD ULTRA-FINE NANO PEN NEEDLE) 32 gauge  x 5/32 (4 mm) Ndle Use as directed  1-2 times per day    polyethylene glycol (MIRALAX) 17 gram/dose powder Mix 17 g (use measure line in cap) in 4-8 ounces of water, juice, soda, coffee, or tea and take by mouth daily as needed.    tacrolimus (PROGRAF) 3 mg, Oral, 2 times a day    TRESIBA FLEXTOUCH U-100 13 Units, Subcutaneous, Daily (standard), Adjust as instructed.    VELTASSA 16.8 g, Oral, Daily (standard)           Past Medical History:   Diagnosis Date    AKI (acute kidney injury) (CMS-HCC) 12/14/2020    Anxiety 10/22/2013    Arthritis     Cervical radiculopathy 12/03/2016    Chronic pain disorder     Lower back    Cirrhosis (CMS-HCC)     Dental abscess 10/2020    Duodenal ulcer 12/01/2017    GERD (gastroesophageal reflux disease)     History of transfusion     Hyperlipidemia 10/22/2013    Hypertension     under control with meds and weight loss    Hypotension 01/29/2022    Liver disease     Sleep apnea, obstructive     Have machine    Stroke (CMS-HCC)     mild stroke    Type 2 diabetes mellitus with diabetic neuropathy, with long-term current use of insulin (CMS-HCC) 06/09/2014       Past Surgical History:   Procedure Laterality Date    KNEE SURGERY      PR CATH PLACE/CORON ANGIO, IMG SUPER/INTERP,R&L HRT CATH, L HRT VENTRIC N/A 12/19/2020    Procedure: CATH LEFT/RIGHT HEART CATHETERIZATION;  Surgeon: Rosana Hoes, MD;  Location: [facility] CATH;  Service: Cardiology    PR COLONOSCOPY FLX DX W/COLLJ SPEC WHEN PFRMD N/A 12/07/2021    Procedure: COLONOSCOPY, FLEXIBLE, PROXIMAL TO SPLENIC FLEXURE; DIAGNOSTIC, W/WO COLLECTION SPECIMEN BY BRUSH OR WASH;  Surgeon: Annie Paras, MD;  [facility]: [facility];  Service: Gastroenterology    PR TRANSPLANT LIVER,ALLOTRANSPLANT N/A 02/06/2022    Procedure: LIVER ALLOTRANSPLANTATION; ORTHOTOPIC, PARTIAL OR WHOLE, FROM CADAVER OR LIVING DONOR, ANY AGE;  Surgeon: Florene Glen, MD;  Location: MAIN OR [place];  Service: Transplant    PR TRANSPLANT,PREP DONOR LIVER/ARTERIAL N/A 02/06/2022    Procedure: BACKBNCH RECONSTRUCT OF CAD/LIVE DONOR LIVER GFT PRIOR TRANSPLANT; ARTERIAL ANASTAMOSIS, EA;  Surgeon: Florene Glen, MD;  Location: MAIN OR [place];  Service: Transplant    PR UPPER GI ENDOSCOPY,BIOPSY N/A 10/12/2020    Procedure: UGI ENDOSCOPY; WITH BIOPSY, SINGLE OR MULTIPLE;  Surgeon: Marene Lenz, MD;  [facility]: [facility];  Service: Gastroenterology    PR UPPER GI ENDOSCOPY,DIAGNOSIS N/A 01/03/2022    Procedure: UGI ENDO, INCLUDE ESOPHAGUS, STOMACH, & DUODENUM &/OR JEJUNUM; DX W/WO COLLECTION SPECIMN, BY BRUSH OR WASH;  Surgeon: Marene Lenz, MD;  [facility]: [facility];  Service: Gastroenterology    PR UPPER GI ENDOSCOPY,LIGAT VARIX N/A 12/07/2021    Procedure: UGI ENDO; Everlene Balls LIG ESOPH &/OR GASTRIC VARICES;  Surgeon: Annie Paras, MD;  Location: [facility];  Service: Gastroenterology    ROOT CANAL      Front teeth       Family History   Problem Relation Age of Onset    Edema Mother     Alzheimer's disease Father     Aortic dissection Brother     Early death Brother Aneurysm Brother  Social History     Tobacco Use    Smoking status: Never    Smokeless tobacco: Never   Vaping Use    Vaping Use: Never used   Substance Use Topics    Alcohol use: Not Currently    Drug use: Not Currently       OBJECTIVE:  BP 134/60  - Pulse 78  - Temp 36.4 ??C (97.5 ??F)  - Resp 16  - SpO2 95%   Wt Readings from Last 12 Encounters:   05/06/22 87.5 kg (193 lb)   04/08/22 86.7 kg (191 lb 1.6 oz)   04/08/22 86.7 kg (191 lb 1.6 oz)   03/11/22 86 kg (189 lb 11.2 oz)   02/25/22 88.6 kg (195 lb 4.8 oz)   02/25/22 88.6 kg (195 lb 4.8 oz)   02/18/22 96.3 kg (212 lb 3.2 oz)   02/11/22 94.8 kg (209 lb)   01/29/22 96.9 kg (213 lb 11.2 oz)   01/14/22 89.8 kg (198 lb)   01/03/22 88.9 kg (196 lb)   12/31/21 90.4 kg (199 lb 6.4 oz)     Physical Exam  Constitutional:       Appearance: Normal appearance.   Pulmonary:      Effort: Pulmonary effort is normal. No respiratory distress.   Skin:     General: Skin is warm and dry.   Neurological:      General: No focal deficit present.      Mental Status: He is alert and oriented to person, place, and time.             BG/insulin reviewed per EMR.   Glucose, POC (mg/dL)   Date Value   57/84/6962 271 (H)   07/11/2022 139   07/11/2022 266 (H)   07/11/2022 181 (H)   04/08/2022 199 (H)   02/27/2022 118   02/15/2022 322 (H)   02/15/2022 101        Summary of labs:  Lab Results   Component Value Date    A1C 6.5 (H) 05/06/2022    A1C 5.2 02/05/2022    A1C 4.7 (L) 01/14/2022     Lab Results   Component Value Date    CREATININE 1.02 07/12/2022     Lab Results   Component Value Date    WBC 5.9 07/12/2022    HGB 12.0 (L) 07/12/2022    HCT 35.6 (L) 07/12/2022    PLT 146 (L) 07/12/2022       Lab Results   Component Value Date    NA 140 07/12/2022    K 4.0 07/12/2022    CL 106 07/12/2022    CO2 26.0 07/12/2022    BUN 13 07/12/2022    CREATININE 1.02 07/12/2022    GLU 150 07/12/2022    CALCIUM 8.9 07/12/2022    MG 1.7 07/12/2022    PHOS 3.6 07/12/2022       Lab Results   Component Value Date    BILITOT 0.4 07/12/2022    BILIDIR 0.20 07/12/2022    PROT 6.6 07/12/2022    ALBUMIN 3.5 07/12/2022    ALT 185 (H) 07/12/2022    AST 59 (H) 07/12/2022    ALKPHOS 335 (H) 07/12/2022    GGT 359 (H) 07/12/2022

## 2022-07-12 NOTE — Unmapped (Addendum)
Matthew Key is a 61 y.o. 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 Celine Mans. He was admitted on 07/10/2022 after weekly post transplant labs revealed elevated transaminases and alk phos with persistent diarrhea for the last several weeks without improvement. Liver doppler was obtained 07/10/22 and showed mild intrahepatic ductal dilatation with patent vessels. His PO mag was transitioned to IV, his atovaquone was transitioned to pentamidine, and he was not noted to be on other diarrhea causing medications. Infectious studies including C diff, stool ova & parasites, GI pathogen panel and serum CMV were obtained and were negative. He reported a recent fall on his right hip for which xrays were obtained, showing no fracture.    GI was consulted for ongoing workup of his diarrhea. He underwent colonoscopy on 07/15/22 which did not show any acute findings. Colon biopsies without inflammation or concern for CMV colitis. Diarrhea resolved after the colonoscopy. After his liver studies had normalized, he had an acute elevation in his LFTs on 07/14/22. VIR was consulted on 07/15/22 for percutaneous liver biopsy which was performed on 12/5 and revealed no acute cellular rejection, no CMV, and presence of kupffer cells suggesting recent/resolving hepatocellular injury. LFTs downtrended at time of discharge. Suspect dehydration lead to hepatocellular injury in setting of diarrhea. Patient continued to have diarrhea, hepatology recommended to start rifaximin given likely SIBO following gastroenteritis, as well as fiber and loperamide given no current infectious etiology. Diarrhea frequency improved by time of discharge.      At the time of discharge patient was tolerating a regular diet, had pain controlled with PO pain medication, resolving diarrhea, and was able to return to the preoperative ambulatory status. Anti-rejection medication levels were monitored and dosages adjusted to maintain a therapeutic regimen with the input of Pharmacy. Physical therapy saw and assessed patient with recommendations for 3x weekly PT.  He will be discharged home in stable condition with home healthcare for PT. Hepatology and Surgery follow up visits scheduled.     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 Initially discharged post transplant on bactrim, and transitioned to atovaquone at some point outpatient (likely related to hyperkalemia)   -CMV: Completed treatment 05/09/22. CMV (D-/R+ or D+/R+): Moderate Risk.  Serum CMV DNA 11/30 not detected.  -ASA 81 at dc: continue  Diarrhea  -functional GI decline consult, hepatology to manage   -rifaximin x14 days, suspect SIBO following likely viral gastroenteritis   -loperamide scheduled x1 week then daily prn   -holding ozempic until diarrhea resolves  Endo  -hold ozempic due to diarrhea  -Basal increased to 15u qAM from 13u while ozempic held  -continue aspart 2:50>150 correctional

## 2022-07-13 LAB — IGA: GAMMAGLOBULIN; IGA: 97.1 mg/dL (ref 70.0–400.0)

## 2022-07-13 LAB — COMPREHENSIVE METABOLIC PANEL
ALBUMIN: 3.2 g/dL — ABNORMAL LOW (ref 3.4–5.0)
ALKALINE PHOSPHATASE: 257 U/L — ABNORMAL HIGH (ref 46–116)
ALT (SGPT): 120 U/L — ABNORMAL HIGH (ref 10–49)
ANION GAP: 8 mmol/L (ref 5–14)
AST (SGOT): 29 U/L (ref <=34)
BILIRUBIN TOTAL: 0.3 mg/dL (ref 0.3–1.2)
BLOOD UREA NITROGEN: 11 mg/dL (ref 9–23)
BUN / CREAT RATIO: 11
CALCIUM: 8.7 mg/dL (ref 8.7–10.4)
CHLORIDE: 104 mmol/L (ref 98–107)
CO2: 25 mmol/L (ref 20.0–31.0)
CREATININE: 1.02 mg/dL
EGFR CKD-EPI (2021) MALE: 83 mL/min/{1.73_m2} (ref >=60)
GLUCOSE RANDOM: 156 mg/dL (ref 70–179)
POTASSIUM: 3.7 mmol/L (ref 3.4–4.8)
PROTEIN TOTAL: 6.1 g/dL (ref 5.7–8.2)
SODIUM: 137 mmol/L (ref 135–145)

## 2022-07-13 LAB — CBC
HEMATOCRIT: 35.5 % — ABNORMAL LOW (ref 39.0–48.0)
HEMOGLOBIN: 11.7 g/dL — ABNORMAL LOW (ref 12.9–16.5)
MEAN CORPUSCULAR HEMOGLOBIN CONC: 33 g/dL (ref 32.0–36.0)
MEAN CORPUSCULAR HEMOGLOBIN: 26.1 pg (ref 25.9–32.4)
MEAN CORPUSCULAR VOLUME: 79 fL (ref 77.6–95.7)
MEAN PLATELET VOLUME: 9.5 fL (ref 6.8–10.7)
PLATELET COUNT: 142 10*9/L — ABNORMAL LOW (ref 150–450)
RED BLOOD CELL COUNT: 4.49 10*12/L (ref 4.26–5.60)
RED CELL DISTRIBUTION WIDTH: 13 % (ref 12.2–15.2)
WBC ADJUSTED: 6.9 10*9/L (ref 3.6–11.2)

## 2022-07-13 LAB — GAMMA GT: GAMMA GLUTAMYL TRANSFERASE: 273 U/L — ABNORMAL HIGH

## 2022-07-13 LAB — BILIRUBIN, DIRECT: BILIRUBIN DIRECT: 0.1 mg/dL (ref 0.00–0.30)

## 2022-07-13 LAB — PHOSPHORUS: PHOSPHORUS: 3.5 mg/dL (ref 2.4–5.1)

## 2022-07-13 LAB — MAGNESIUM: MAGNESIUM: 1.6 mg/dL (ref 1.6–2.6)

## 2022-07-13 MED ADMIN — insulin lispro (HumaLOG) injection 0-20 Units: 0-20 [IU] | SUBCUTANEOUS | @ 23:00:00

## 2022-07-13 MED ADMIN — methocarbamoL (ROBAXIN) tablet 500 mg: 500 mg | ORAL | @ 14:00:00

## 2022-07-13 MED ADMIN — diphenoxylate-atropine (LOMOTIL) 2.5-0.025 mg per tablet 1 tablet: 1 | ORAL | @ 17:00:00

## 2022-07-13 MED ADMIN — tacrolimus (PROGRAF) capsule 2 mg: 2 mg | ORAL | @ 23:00:00

## 2022-07-13 MED ADMIN — insulin lispro (HumaLOG) injection 0-20 Units: 0-20 [IU] | SUBCUTANEOUS | @ 17:00:00

## 2022-07-13 MED ADMIN — insulin lispro (HumaLOG) injection 0-20 Units: 0-20 [IU] | SUBCUTANEOUS | @ 03:00:00

## 2022-07-13 MED ADMIN — aspirin chewable tablet 81 mg: 81 mg | ORAL | @ 14:00:00

## 2022-07-13 MED ADMIN — everolimus (immunosuppressive) (ZORTRESS) tablet 2 mg: 2 mg | ORAL | @ 11:00:00

## 2022-07-13 MED ADMIN — acetaminophen (TYLENOL) tablet 650 mg: 650 mg | ORAL | @ 03:00:00

## 2022-07-13 MED ADMIN — DULoxetine (CYMBALTA) DR capsule 60 mg: 60 mg | ORAL | @ 14:00:00

## 2022-07-13 MED ADMIN — gabapentin (NEURONTIN) capsule 300 mg: 300 mg | ORAL | @ 03:00:00

## 2022-07-13 MED ADMIN — insulin lispro (HumaLOG) injection 0-20 Units: 0-20 [IU] | SUBCUTANEOUS | @ 13:00:00

## 2022-07-13 MED ADMIN — diphenoxylate-atropine (LOMOTIL) 2.5-0.025 mg per tablet 1 tablet: 1 | ORAL | @ 23:00:00

## 2022-07-13 MED ADMIN — potassium chloride ER tablet 20 mEq: 20 meq | ORAL | @ 12:00:00 | Stop: 2022-07-13

## 2022-07-13 MED ADMIN — everolimus (immunosuppressive) (ZORTRESS) tablet 1 mg: 1 mg | ORAL | @ 23:00:00

## 2022-07-13 MED ADMIN — tacrolimus (PROGRAF) capsule 3 mg: 3 mg | ORAL | @ 11:00:00

## 2022-07-13 MED ADMIN — magnesium sulfate 2gm/50mL IVPB: 2 g | INTRAVENOUS | @ 12:00:00 | Stop: 2022-07-13

## 2022-07-13 MED ADMIN — melatonin tablet 3 mg: 3 mg | ORAL | @ 03:00:00

## 2022-07-13 MED ADMIN — amlodipine (NORVASC) tablet 10 mg: 10 mg | ORAL | @ 14:00:00

## 2022-07-13 MED ADMIN — gabapentin (NEURONTIN) capsule 300 mg: 300 mg | ORAL | @ 19:00:00

## 2022-07-13 MED ADMIN — gabapentin (NEURONTIN) capsule 300 mg: 300 mg | ORAL | @ 14:00:00

## 2022-07-13 MED ADMIN — insulin NPH (HumuLIN,NovoLIN) injection 6 Units: 6 [IU] | SUBCUTANEOUS | @ 02:00:00

## 2022-07-13 MED ADMIN — insulin NPH (HumuLIN,NovoLIN) injection 6 Units: 6 [IU] | SUBCUTANEOUS | @ 14:00:00

## 2022-07-13 MED ADMIN — magnesium sulfate 2gm/50mL IVPB: 2 g | INTRAVENOUS | @ 14:00:00

## 2022-07-13 NOTE — Unmapped (Shared)
Discharge Summary    Admit date: 07/10/2022    Discharge date and time: 07/13/22 7:10 AM     Discharge to:  Home    Discharge Service: Surg Transplant [facility])    Discharge Attending Physician: Florene Glen, MD    Discharge  Diagnoses: Diarrhea, transaminitis     Secondary Diagnosis: Principal Problem:    Transaminitis of transplant liver (POA: Unknown)  Active Problems:    Liver transplant status (CMS-HCC) (POA: Not Applicable)    Diarrhea (POA: Unknown)  Resolved Problems:    * No resolved hospital problems. *      OR Procedures:  None     Ancillary Procedures: no procedures    Discharge Day Services:     Subjective   No acute events overnight. Pain Controlled. No fever or chills. Events overnight include continues to have episodes of diarrhea.    Objective   Patient Vitals for the past 8 hrs:   BP Temp Temp src Pulse Resp SpO2 Weight   07/13/22 0537 -- -- -- -- -- -- 85.3 kg (188 lb 1.6 oz)   07/12/22 2320 134/78 37.2 ??C (99 ??F) Oral 78 17 98 % --     No intake/output data recorded.    {EXAM;SUR Complete normal and system select:[id number]}    Hospital Course:  Matthew Key is a 61 y.o. 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 Celine Mans. He was admitted on 07/10/2022 after weekly post transplant labs revealed elevated transaminases and alk phos with persistent diarrhea for the last several weeks without improvement. Liver doppler was obtained 07/10/22 and showed mild intrahepatic ductal dilatation with patent vessels. His PO mag was transitioned to IV, his atovaquone was transitioned to pentamidine, and he was not noted to be on other diarrhea causing medications. Infectious studies including C diff, stool ova & parasites, GI pathogen panel and CMV were obtained and were negative. He reported a recent fall on his right hip for which xrays were obtained, showing no fracture.    At the time of discharge patient was tolerating a regular diet, had pain controlled with PO pain medication, and able to return to the preoperative ambulatory status. Anti-rejection medication levels were monitored and dosages adjusted to maintain a therapeutic regimen with the input of Pharmacy.  He will be discharged home in stable condition without home healthcare.    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 Initially discharged post transplant on bactrim, and transitioned to atovaquone at some point outpatient (likely related to hyperkalemia)   -CMV: Completed treatment 05/09/22. CMV (D-/R+ or D+/R+): Moderate Risk.  Serum CMV DNA 11//30 not detected.  -ASA 81 at dc: continue       Condition at Discharge: Improved  Discharge Medications:      Medication List      CHANGE how you take these medications     * ACCU-CHEK GUIDE GLUCOSE METER Misc; Generic drug: blood-glucose meter;   Use to test blood sugar as directed; What changed: Another medication with   the same name was removed. Continue taking this medication, and follow the   directions you see here.   * ACCU-CHEK GUIDE  GLUCOSE METER Misc; Generic drug: blood-glucose meter;   Use as instructed; What changed: Another medication with the same name was   removed. Continue taking this medication, and follow the directions you   see here.   everolimus (immunosuppressive) 0.5 mg tablet; Commonly known as:   ZORTRESS; Take 4 tablets (2 mg total) by mouth daily AND 2 tablets (1 mg   total) nightly.; What changed: See the new instructions.   tacrolimus 1 MG capsule; Commonly known as: PROGRAF; Take 3 capsules (3   mg total) by mouth daily AND 2 capsules (2 mg total) nightly.; What   changed: See the new instructions.  * This list has 2 medication(s) that are the same as other medications   prescribed for you. Read the directions carefully, and ask your doctor or   other care provider to review them with you.     CONTINUE taking these medications     ACCU-CHEK GUIDE TEST STRIPS Strp; Generic drug: blood sugar diagnostic;   Use to check blood sugar as directed with insulin 3 times a day & for   symptoms of high or low blood sugar   ACCU-CHEK SOFTCLIX LANCETS Misc; Generic drug: lancets; Use to check   blood sugar as directed with insulin 3 times a day & for symptoms of high   or low blood sugar.   acetaminophen 325 MG tablet; Commonly known as: TYLENOL; Take 1-2   tablets (325-650 mg total) by mouth every six (6) hours as needed for pain   or fever.   amlodipine 10 MG tablet; Commonly known as: NORVASC; Take 1 tablet (10   mg total) by mouth daily.   aspirin 81 MG tablet; Commonly known as: ECOTRIN; Take 1 tablet (81 mg   total) by mouth daily.   BAQSIMI 3 mg/actuation Spry; Generic drug: glucagon spray; Use 1 spray   into the left nostril every fifteen (15) minutes as needed (low blood   sugar).   DULoxetine 30 MG capsule; Commonly known as: CYMBALTA; Take 2 capsules   (60 mg total) by mouth daily.   gabapentin 300 MG capsule; Commonly known as: NEURONTIN; Take 1 capsule   (300 mg total) by mouth Three (3) times a day.   hydrOXYzine 50 MG capsule; Commonly known as: VISTARIL; Take 1 capsule   (50 mg total) by mouth nightly as needed for anxiety.   melatonin 3 mg Tab; Take 1 tablet (3 mg total) by mouth every evening.   methocarbamoL 500 MG tablet; Commonly known as: ROBAXIN; Take 1 tablet   (500 mg total) by mouth Three (3) times a day as needed.   NovoLOG Flexpen U-100 Insulin 100 unit/mL (3 mL) injection pen; Generic   drug: insulin aspart; Inject under the skin per sliding scale prior to   meals. If premeal BG 151-200 take 2 additional units for correction, if   201-250 take 4 additional units, etc. Max dose 36 units per day.  Store in-use prefilled pens at room temperature <86?F and use within 28 days; do   not refrigerate.   OZEMPIC 0.25 mg or 0.5 mg (2 mg/3 mL) Pnij; Generic drug: semaglutide;   Inject 0.5 mg under the skin every seven (7) days.   pantoprazole 40 MG tablet; Commonly known as: Protonix; Take 1 tablet   (40 mg total) by mouth daily as needed.   TRESIBA FLEXTOUCH U-100 100 unit/mL (3 mL) Inpn; Generic drug: insulin   degludec; Inject 0.13 mL (13 Units total) under the skin daily. Adjust  as   instructed.   ULTICARE PEN NEEDLE 32 gauge x 5/32 (4 mm) Ndle; Generic drug: pen   needle, diabetic; Use as directed  1-2 times per day     STOP taking these medications     atorvastatin 40 MG tablet; Commonly known as: LIPITOR   atovaquone 750 mg/5 mL suspension; Commonly known as: MEPRON   magnesium (amino acid chelate) 133 mg; Generic drug: magnesium oxide-Mg   AA chelate   patiromer calcium sorbitex 16.8 gram powder packet; Commonly known as:   VELTASSA   polyethylene glycol 17 gram/dose powder; Commonly known as: MIRALAX   VELTASSA 16.8 gram powder packet; Generic drug: patiromer calcium   sorbitex       Pending Test Results:     Discharge Instructions:  Activity:     Diet:    Other Instructions:  Other Instructions       Discharge instructions      Discharge Instructions:  Activity: Resume    Diet: regular    Your Liver Post-Transplant Coordinator is Emilio Math (see contact info at bottom of instructions). Contact your transplant coordinator or the Transplant Surgery Office [phone]) during business hours or page the transplant coordinator on call [phone]) after business hours for:    - fever >100.5 degrees F by mouth, any fever with shaking chills, or other signs or symptoms of infection   - uncontrolled nausea, vomiting, or diarrhea; inability to have a bowel movement for > 3 days.   - any problem that prevents taking medications as scheduled.   - pain uncontrolled with prescribed medication or new pain or tenderness at the surgical site   - sudden weight gain or increase in blood pressure (greater than 140/85)   - shortness of breath, chest pain / discomfort   - new or increasing jaundice   - urinary symptoms including pain / difficulty / burning or tea-colored urine   - any other new or concerning symptoms   - questions regarding your medications or continuing care    Maintain a written record of medications taken and review against the discharge medications sheet. Periodically review your Transplant Handbook for important information regarding postoperative care and required precautions.    Labs and Other Follow-ups after Discharge: resume baseline labs and frequency    Liver Post-Transplant Coordinator:  Emilio Math- phone: [phone] fax: [phone]          Labs and Other Follow-ups after Discharge:  Follow Up instructions and Outpatient Referrals     Discharge instructions          Future Appointments:  Appointments which have been scheduled for you      Jul 26, 2022 11:30 AM  (Arrive by 11:00 AM)  RETURN VIDEO HCP MYCHART with Alvester Chou, PsyD  [facility] TRANSPLANT SURGERY [place] [facility] REGION) [address]  [place] [address]  [phone]   Please sign into My [place] Chart at least 15 minutes before your appointment to complete the eCheck-In process. You must complete eCheck-In before you can start your video visit. We also recommend testing your audio and video connection to troubleshoot any issues before your visit begins. Click ???Join Video Visit??? to complete these checks. Once you have completed eCheck-In and tested your audio and video, click ???Join Call??? to connect to your visit.     For your video visit, you will need a computer with a working camera, speaker and microphone, a smartphone, or a tablet with internet access.  My [place] Chart enables you to manage your health, send non-urgent messages to your provider, view your test results, schedule and manage appointments, and request prescription refills securely and conveniently from your computer or mobile device.    You can go to [email or web address] to sign in to your My Reeves Chart account with your username and password. If you have forgotten your username or password, please choose the ???Forgot Username???? and/or ???Forgot Password???? links to gain access. You also can access your My Star Chart account with the free MyChart mobile app for Android or iPhone.    If you need assistance accessing your My Camb[place] Chart account or for assistance in reaching your provider's office to reschedule or cancel your appointment, please call [facility] [phone].         Jul 29, 2022 11:30 AM  (Arrive by 11:00 AM)  RETURN VIDEO HCP MYCHART with TRANSPLANT SURGERY PHARMACY  [facility] TRANSPLANT SURGERY [place] [facility] REGION) [address]  [place] [address]  [phone]   Please sign into My [place] Chart at least 15 minutes before your appointment to complete the eCheck-In process. You must complete eCheck-In before you can start your video visit. We also recommend testing your audio and video connection to troubleshoot any issues before your visit begins. Click ???Join Video Visit??? to complete these checks. Once you have completed eCheck-In and tested your audio and video, click ???Join Call??? to connect to your visit.     For your video visit, you will need a computer with a working camera, speaker and microphone, a smartphone, or a tablet with internet access.    My [place] Chart enables you to manage your health, send non-urgent messages to your provider, view your test results, schedule and manage appointments, and request prescription refills securely and conveniently from your computer or mobile device.    You can go to [email or web address] to sign in to your My [place] Chart account with your username and password. If you have forgotten your username or password, please choose the ???Forgot Username???? and/or ???Forgot Password???? links to gain access. You also can access your My Bernie Chart account with the free MyChart mobile app for Android or iPhone.    If you need assistance accessing your My  Chart account or for assistance in reaching your provider's office to reschedule or cancel your appointment, please call [facility] [phone].         Jul 30, 2022 10:30 AM  (Arrive by 10:00 AM)  RETURN  HEPATOLOGY with Janyth Pupa, MD  [facility] LIVER TRANSPLANT [place] [facility] REGION) [address]  [place] [address]  [phone]        Jul 31, 2022 11:30 AM  (Arrive by 11:00 AM)  RETURN VIDEO HCP MYCHART with Jackolyn Confer, RD/LDN  [facility] NUTRITION SERVICES TRANSPLANT [place] [facility] REGION) [address]  [place] [address]  [phone]   Please sign into My  Chart at least 15 minutes before your appointment to complete the eCheck-In process. You must complete eCheck-In before you can start your video visit. We also recommend testing your audio and video connection to troubleshoot any issues before your visit begins. Click ???Join Video Visit??? to complete these checks. Once you have completed eCheck-In and tested your audio and video, click ???Join Call??? to connect to your visit.     For your video visit, you will need  a computer with a working Warden/ranger, speaker and microphone, a smartphone, or a tablet with internet access.    My [place] Chart enables you to manage your health, send non-urgent messages to your provider, view your test results, schedule and manage appointments, and request prescription refills securely and conveniently from your computer or mobile device.    You can go to [email or web address] to sign in to your My Vera Chart account with your username and password. If you have forgotten your username or password, please choose the ???Forgot Username???? and/or ???Forgot Password???? links to gain access. You also can access your My [place] Chart account with the free MyChart mobile app for Android or iPhone.    If you need assistance accessing your My [place] Chart account or for assistance in reaching your provider's office to reschedule or cancel your appointment, please call [facility] [phone].         Nov 05, 2022 10:30 AM  (Arrive by 10:00 AM)  RETURN  HEPATOLOGY with Janyth Pupa, MD  [facility] LIVER TRANSPLANT [place] [facility] REGION) [address]  [place] [address]  [phone]        Nov 07, 2022  2:00 PM  (Arrive by 1:30 PM)  RETURN VIDEO HCP MYCHART with TRANSPLANT SURGERY PHARMACY  [facility] TRANSPLANT SURGERY [place] [facility] REGION) [address]  [place] [address]  [phone]   Please sign into My [place] Chart at least 15 minutes before your appointment to complete the eCheck-In process. You must complete eCheck-In before you can start your video visit. We also recommend testing your audio and video connection to troubleshoot any issues before your visit begins. Click ???Join Video Visit??? to complete these checks. Once you have completed eCheck-In and tested your audio and video, click ???Join Call??? to connect to your visit.     For your video visit, you will need a computer with a working camera, speaker and microphone, a smartphone, or a tablet with internet access.    My [place] Chart enables you to manage your health, send non-urgent messages to your provider, view your test results, schedule and manage appointments, and request prescription refills securely and conveniently from your computer or mobile device.    You can go to [email or web address] to sign in to your My Jasper Chart account with your username and password. If you have forgotten your username or password, please choose the ???Forgot Username???? and/or ???Forgot Password???? links to gain access. You also can access your My [place] Chart account with the free MyChart mobile app for Android or iPhone.    If you need assistance accessing your My [place] Chart account or for assistance in reaching your provider's office to reschedule or cancel your appointment, please call [facility] [phone].         Jan 28, 2023  9:00 AM  (Arrive by 8:30 AM)  XR DEXA BONE DENSITY SKELETAL with Doran Durand RM 1  IMG DEXA [facility]) [address]  [place] [address]  [phone]   No calcium supplements 24 hrs prior.         Jan 28, 2023 10:00 AM  (Arrive by 9:30 AM)  RETURN  HEPATOLOGY with Janyth Pupa, MD  [facility] LIVER TRANSPLANT [place] [facility] REGION) [address]  [place] [address]  [phone]        Feb 06, 2023  8:30  AM  (Arrive by 8:00 AM)  RETURN VIDEO HCP MYCHART with TRANSPLANT SURGERY PHARMACY  [facility] TRANSPLANT SURGERY [place] [facility] REGION) [address]  [place] [address]  [phone]   Please sign into My [place] Chart at least 15 minutes before your appointment to complete the eCheck-In process. You must complete eCheck-In before you can start your video visit. We also recommend testing your audio and video connection to troubleshoot any issues before your visit begins. Click ???Join Video Visit??? to complete these checks. Once you have completed eCheck-In and tested your audio and video, click ???Join Call??? to connect to your visit.     For your video visit, you will need a computer with a working camera, speaker and microphone, a smartphone, or a tablet with internet access.    My [place] Chart enables you to manage your health, send non-urgent messages to your provider, view your test results, schedule and manage appointments, and request prescription refills securely and conveniently from your computer or mobile device.    You can go to [email or web address] to sign in to your My [place] Chart account with your username and password. If you have forgotten your username or password, please choose the ???Forgot Username???? and/or ???Forgot Password???? links to gain access. You also can access your My [place] Chart account with the free MyChart mobile app for Android or iPhone.    If you need assistance accessing your My [place] Chart account or for assistance in reaching your provider's office to reschedule or cancel your appointment, please call [facility] [phone].         Feb 06, 2023 10:00 AM  (Arrive by 9:30 AM)  RETURN VIDEO HCP MYCHART with Jackolyn Confer, RD/LDN  [facility] NUTRITION SERVICES TRANSPLANT [place] [facility] REGION) [address]  [place] [address]  [phone]   Please sign into My [place] Chart at least 15 minutes before your appointment to complete the eCheck-In process. You must complete eCheck-In before you can start your video visit. We also recommend testing your audio and video connection to troubleshoot any issues before your visit begins. Click ???Join Video Visit??? to complete these checks. Once you have completed eCheck-In and tested your audio and video, click ???Join Call??? to connect to your visit.     For your video visit, you will need a computer with a working camera, speaker and microphone, a smartphone, or a tablet with internet access.    My [place] Chart enables you to manage your health, send non-urgent messages to your provider, view your test results, schedule and manage appointments, and request prescription refills securely and conveniently from your computer or mobile device.    You can go to [email or web address] to sign in to your My [place] Chart account with your username and password. If you have forgotten your username or password, please choose the ???Forgot Username???? and/or ???Forgot Password???? links to gain access. You also can access your My [place] Chart account with the free MyChart mobile app for Android or iPhone.    If you need assistance accessing your My  Chart account or for assistance in reaching your provider's office to reschedule or cancel your appointment, please call [facility] [phone].         Feb 07, 2023  9:00 AM  (Arrive by 8:30 AM)  RETURN VIDEO HCP MYCHART with Smiley Houseman  [facility] LIVER TRANSPLANT [place])  [address]  [place] [address]  [phone]   Please sign into My [place] Chart at least 15 minutes before your appointment to complete the eCheck-In process. You must complete eCheck-In before you can start your video visit. We also recommend testing your audio and video connection to troubleshoot any issues before your visit begins. Click ???Join Video Visit??? to complete these checks. Once you have completed eCheck-In and tested your audio and video, click ???Join Call??? to connect to your visit.     For your video visit, you will need a computer with a working camera, speaker and microphone, a smartphone, or a tablet with internet access.    My [place] Chart enables you to manage your health, send non-urgent messages to your provider, view your test results, schedule and manage appointments, and request prescription refills securely and conveniently from your computer or mobile device.    You can go to [email or web address] to sign in to your My [place] Chart account with your username and password. If you have forgotten your username or password, please choose the ???Forgot Username???? and/or ???Forgot Password???? links to gain access. You also can access your My [place] Chart account with the free MyChart mobile app for Android or iPhone.    If you need assistance accessing your My [place] Chart account or for assistance in reaching your provider's office to reschedule or cancel your appointment, please call [facility] [phone].         Feb 07, 2023 11:30 AM  (Arrive by 11:00 AM)  RETURN VIDEO HCP MYCHART with Alvester Chou, PsyD  [facility] TRANSPLANT SURGERY [place] [facility] REGION) [address]  [place] [address]  [phone]   Please sign into My [place] Chart at least 15 minutes before your appointment to complete the eCheck-In process. You must complete eCheck-In before you can start your video visit. We also recommend testing your audio and video connection to troubleshoot any issues before your visit begins. Click ???Join Video Visit??? to complete these checks. Once you have completed eCheck-In and tested your audio and video, click ???Join Call??? to connect to your visit.     For your video visit, you will need a computer with a working camera, speaker and microphone, a smartphone, or a tablet with internet access.    My [place] Chart enables you to manage your health, send non-urgent messages to your provider, view your test results, schedule and manage appointments, and request prescription refills securely and conveniently from your computer or mobile device.    You can go to [email or web address] to sign in to your My [place] Chart account with your username and password. If you have forgotten your username or password, please choose the ???Forgot Username???? and/or ???Forgot Password???? links to gain access. You also can access your My [place] Chart account with the free MyChart mobile app for Android or iPhone.    If you need assistance accessing your My [place] Chart account or for assistance in reaching your provider's office to reschedule or cancel your appointment, please call [facility] [phone].

## 2022-07-13 NOTE — Unmapped (Signed)
PHYSICAL THERAPY  Evaluation (07/13/22 0815)          Patient Name:  Matthew Key       Medical Record Number: [id number]   Date of Birth: 1961-05-09  Sex: Male        Treatment Diagnosis: Right hip pain, PT evaluation only     Activity Tolerance: Tolerated treatment well     ASSESSMENT  Problem List: Pain, Gait deviation      Matthew Key is a 61 y.o. 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 Celine Mans. He was admitted on 07/10/2022 after weekly post transplant labs revealed elevated transaminases and alk phos with persistent diarrhea for the last several weeks without improvement.    Pt with complaints of right hip pain following a fall 1.5 weeks ago.    Assessment : On PT evaluation, pt demonstrated impairments noted above. Pt's symptoms appear consistent with groin strain given muscular nature, assessment, and negative imaging.  Pt's pain is not significantly impacting pt's independence, however pt reported no significant improvement since onset. No further PT needs identified in this setting.  Recommend PT 3x weekly (community ambulator) post acute discharge if pain continues to persist and advised use of RW vs cane for gentle activity and to avoid direct stretching/isolated strengthening in acute phase..  After a review of the personal factors, comorbidities, clinical presentation, and examination of the number of affected body systems, the patient presents as a low complexity case.         Today's Interventions: Evaluation, gait training, PT education: role of PT and POC, use of RW vs left straight cane for comfort during ambulation, cleared to amb short distance to/from bathroom with or without AD per patient preference, use of ice/heat per pt preference with 20 min on, 20 min off with insolation between temperature and skin         PLAN  Planned Frequency of Treatment:  D/C Services for: D/C Services       Planned Interventions: Post-Discharge Physical Therapy Recommendations:  PT Post Acute Discharge Recommendations: 3x weekly, Community Ambulator (if pain not improved)      PT DME Recommendations: None (pt owns necessary DME)            Goals:   Patient and Family Goals: to decrease pain          Prognosis:  Good  Positive Indicators: PLOF, CLOF  Barriers to Discharge: None     SUBJECTIVE  Patient reports: Agreeable to PT, I'm thinking if I can work this out the pain will go away  Current Functional Status: Pt received/left in semi fowlers with all needs met/within reach, RN aware     Prior Functional Status: Pt independent with mobility and ADL without DME at baseline since transplant in June.  Pt reoprted increased pain and difficulty walking since recent fallPt reported a fall a week and a half ago on friend's walkway while helping him move luggage.  Pt denied any additional falls.  Pt lives with spouse who is also independent but has knee arthritis and breast cancer per pt.  Equipment available at home: [employer], Straight cane, Paediatric nurse with back, Long-Handled Shoehorn, Long-Handled Sponge, Sock aid      Past Medical History:   Diagnosis Date    AKI (acute kidney injury) (CMS-HCC) 12/14/2020    Anxiety 10/22/2013    Arthritis     Cervical radiculopathy 12/03/2016  Chronic pain disorder     Lower back    Cirrhosis (CMS-HCC)     Dental abscess 10/2020    Duodenal ulcer 12/01/2017    GERD (gastroesophageal reflux disease)     History of transfusion     Hyperlipidemia 10/22/2013    Hypertension     under control with meds and weight loss    Hypotension 01/29/2022    Liver disease     Sleep apnea, obstructive     Have machine    Stroke (CMS-HCC)     mild stroke    Type 2 diabetes mellitus with diabetic neuropathy, with long-term current use of insulin (CMS-HCC) 06/09/2014            Social History     Tobacco Use    Smoking status: Never    Smokeless tobacco: Never   Substance Use Topics    Alcohol use: Not Currently       Past Surgical History:   Procedure Laterality Date    KNEE SURGERY      PR CATH PLACE/CORON ANGIO, IMG SUPER/INTERP,R&L HRT CATH, L HRT VENTRIC N/A 12/19/2020    Procedure: CATH LEFT/RIGHT HEART CATHETERIZATION;  Surgeon: Rosana Hoes, MD;  Location: [facility] CATH;  Service: Cardiology    PR COLONOSCOPY FLX DX W/COLLJ SPEC WHEN PFRMD N/A 12/07/2021    Procedure: COLONOSCOPY, FLEXIBLE, PROXIMAL TO SPLENIC FLEXURE; DIAGNOSTIC, W/WO COLLECTION SPECIMEN BY BRUSH OR WASH;  Surgeon: Annie Paras, MD;  Location: [facility];  Service: Gastroenterology    PR TRANSPLANT LIVER,ALLOTRANSPLANT N/A 02/06/2022    Procedure: LIVER ALLOTRANSPLANTATION; ORTHOTOPIC, PARTIAL OR WHOLE, FROM CADAVER OR LIVING DONOR, ANY AGE;  Surgeon: Florene Glen, MD;  Location: MAIN OR [place];  Service: Transplant    PR TRANSPLANT,PREP DONOR LIVER/ARTERIAL N/A 02/06/2022    Procedure: BACKBNCH RECONSTRUCT OF CAD/LIVE DONOR LIVER GFT PRIOR TRANSPLANT; ARTERIAL ANASTAMOSIS, EA;  Surgeon: Florene Glen, MD;  Location: MAIN OR [place];  Service: Transplant    PR UPPER GI ENDOSCOPY,BIOPSY N/A 10/12/2020    Procedure: UGI ENDOSCOPY; WITH BIOPSY, SINGLE OR MULTIPLE;  Surgeon: Marene Lenz, MD;  Location: [facility];  Service: Gastroenterology    PR UPPER GI ENDOSCOPY,DIAGNOSIS N/A 01/03/2022    Procedure: UGI ENDO, INCLUDE ESOPHAGUS, STOMACH, & DUODENUM &/OR JEJUNUM; DX W/WO COLLECTION SPECIMN, BY BRUSH OR WASH;  Surgeon: Marene Lenz, MD;  [facility]: [facility];  Service: Gastroenterology    PR UPPER GI ENDOSCOPY,LIGAT VARIX N/A 12/07/2021    Procedure: UGI ENDO; Everlene Balls LIG ESOPH &/OR GASTRIC VARICES;  Surgeon: Annie Paras, MD;  Location: [facility];  Service: Gastroenterology    ROOT CANAL      Front teeth             Family History   Problem Relation Age of Onset    Edema Mother     Alzheimer's disease Father     Aortic dissection Brother     Early death Brother     Aneurysm Brother         Allergies: Venom-honey bee                  Objective Findings  Precautions / Restrictions  Precautions: Non-applicable  Weight Bearing Status: Non-applicable  Required Braces or Orthoses: Non-applicable     Communication Preference: Verbal          Pain Comments: 7/10 pain in right groin at rest that worsens with activity  Medical  Tests / Procedures: EMR reviewed, xray right hip and right knee negative for fracture/dislocation per imaging report  Equipment / Environment: Vascular access (PIV, TLC, Port-a-cath, PICC)     Vitals/Orthostatics : VSS, NAD, asymptomatic     Living Situation  Living Environment: Apartment  Lives With: Spouse  Home Living: One level home, Level entry, Standard height toilet, Tub/shower unit, Shower chair with back, Grab bars in shower      Cognition: WFL  Orientation: Oriented x4  Visual/Perception: Within Functional Limits (reading glasses)     Skin Inspection: Intact where visualized     Upper Extremities  UE ROM: Right WFL, Left WFL  UE Strength: Right WFL, Left WFL    Lower Extremities  LE ROM: Right WFL, Left WFL  LE Strength: Right WFL, Left WFL  LE comment: RLE grossly limited by groin pain but strength/ROM [facility]     Sensation: WFL  Posture: WFL    Static Sitting-Balance Support: Feet supported, No upper extremity supported  Static Sitting-Level of Assistance: Independent  Dynamic Sitting-Balance Support: Feet supported, No upper extremity supported  Dynamic Sitting-Balance: Trunk control activities  Dynamic Sitting-Level of Assistance: Independent  Sitting Balance comments: static/dynamic sitting mod I    Static Standing-Balance Support: No upper extremity supported  Static Standing-Level of Assistance: Independent  Dynamic Standing-Balance Support: No upper extremity supported  Dynamic Standing-Balance: Reaching for objects  Dynamic Standing - Level of Assistance: Independent  Standing Balance comments: static/dynmaic standing without AD mod I      Bed Mobility: Supine to Sit  Supine to Sit assistance level: Independent  Bed Mobility: supine > sitting EOB mod I     Transfers: Sit to Stand  Sit to Stand assistance level: Independent  Transfer comments: STS without AD mod I      Gait Level of Assistance: Modified independent, requires aide device or extra time  Gait Assistive Device: Front wheel walker  Gait Distance Ambulated (ft): 150 ft  Gait: Pt amb 150' with slightly antalgic but stable gait with appropriate speed, form, and stability.  Pt with increased pain in right groin during left swing that was minimally improved with RW.     Stairs: deferred- n/a            Endurance: limited by pain and frequent need to use restroom     Physical Therapy Session Duration  PT Individual [mins]: 30     Medical Staff Made Aware: RN     I attest that I have reviewed the above information.  Signed: Mahala Menghini, PT  Filed 07/13/2022

## 2022-07-13 NOTE — Unmapped (Signed)
Transplant Surgery Progress Note    Hospital Day: 4    Assessment:     Matthew Key is a 61 y.o. 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 Celine Mans. He was admitted on 07/10/2022 after weekly post transplant labs revealed elevated transaminases and alk phos with persistent diarrhea for the last several weeks without improvement. Infectious workup negative.     Interval Events:     No acute events overnight. Pain is well controlled.  Vital signs are stable. Urine output is appropriate.  Patient having bowel movements. Continues to have 4-6 episodes diarrhea overnight. Liver enzymes downtrending.     Plan:     Neuro:  - Pain well controlled  - Home gabapentin, duloxetine, APAP PRN, robaxin PRN    CV:  - HDS, Maintain SBP < 180  - Home amlodipine, ASA    Pulm:  - Stable on room air    GI:  - F: Medlock  - E: Replete as needed  - N: regular diet  - pantoprazole  - IV mag 2g daily  - start lomotil today  - consult GI for persistent diarrhea despite negative workup, appreciate recs       GU:  - Voiding spontaneously    Endo:  - endocrine consulted, appreciate recs  - NPH 6U q12h  - lispro target 140, ISF 30 achs  - hypoglycemia protocol  - POCT-BG achs     Heme/ID:  - Afebrile, WBC and Hgb stable  - ppx with pentamidine (hold atovaquone)  - C. Diff, GI Path Panel, CMV, O&P negative  - CXR    MSK:  - PT consult for R hip pain: recommend outpatient PT prn    Immuno:  - tac, everolimus  - pharmacy recommendations for dosing      Dispo  - Floor      Objective:        Vital Signs:  BP 144/71  - Pulse 80  - Temp 36.6 ??C (97.9 ??F) (Oral)  - Resp 17  - Ht 172.7 cm (5' 8)  - Wt 85.3 kg (188 lb 1.6 oz)  - SpO2 97%  - BMI 28.60 kg/m??     Input/Output:  I/O last 3 completed shifts:  In: 680 [P.O.:680]  Out: 1400 [Urine:1400]    Physical Exam:    General: Cooperative, no distress, well appearing male, lying comfortably in bed  Pulmonary: Normal work of breathing, on room air  Cardiovascular: Regular rate  Abdomen: Soft, non-tender, non-distended. Surgical incision well-healed.   Musculoskeletal: Moves extremities spontaneously   Neurologic: Alert and interactive, grossly intact    Labs:  Lab Results   Component Value Date    WBC 6.9 07/13/2022    HGB 11.7 (L) 07/13/2022    HCT 35.5 (L) 07/13/2022    PLT 142 (L) 07/13/2022       Lab Results   Component Value Date    NA 137 07/13/2022    K 3.7 07/13/2022    CL 104 07/13/2022    CO2 25.0 07/13/2022    BUN 11 07/13/2022    CREATININE 1.02 07/13/2022    CALCIUM 8.7 07/13/2022    MG 1.6 07/13/2022    PHOS 3.5 07/13/2022       Microbiology Results (last day)       Procedure Component Value Date/Time Date/Time    C. Difficile Assay [[id number]]  (Normal) Collected: 07/11/22 0603    Lab Status: Final  result Specimen: Stool  Updated: 07/11/22 0843     C. Diff Result Negative     Comment: Clostridium difficile NOT detected       Narrative:      The methodology of this test detects C. difficile toxin A and/or toxin B, by EIA.        GI Pathogen Panel [[id number]] Collected: 07/11/22 0603    Lab Status: In process Specimen: Stool  Updated: 07/11/22 0640    Comprehensive Ova and Parasite Exam [[id number]] Collected: 07/11/22 0605    Lab Status: In process Specimen: Stool  Updated: 07/11/22 0640            Imaging:  All pertinent imaging personally reviewed.    Montez Hageman, MD  PGY-1, General Surgery  [facility]

## 2022-07-14 LAB — COMPREHENSIVE METABOLIC PANEL
ALBUMIN: 3.6 g/dL (ref 3.4–5.0)
ALKALINE PHOSPHATASE: 502 U/L — ABNORMAL HIGH (ref 46–116)
ALT (SGPT): 606 U/L — ABNORMAL HIGH (ref 10–49)
ANION GAP: 7 mmol/L (ref 5–14)
AST (SGOT): 807 U/L — ABNORMAL HIGH (ref <=34)
BILIRUBIN TOTAL: 0.7 mg/dL (ref 0.3–1.2)
BLOOD UREA NITROGEN: 13 mg/dL (ref 9–23)
BUN / CREAT RATIO: 13
CALCIUM: 9.1 mg/dL (ref 8.7–10.4)
CHLORIDE: 106 mmol/L (ref 98–107)
CO2: 24 mmol/L (ref 20.0–31.0)
CREATININE: 1.04 mg/dL
EGFR CKD-EPI (2021) MALE: 81 mL/min/{1.73_m2} (ref >=60)
GLUCOSE RANDOM: 111 mg/dL (ref 70–179)
POTASSIUM: 3.8 mmol/L (ref 3.4–4.8)
PROTEIN TOTAL: 6.8 g/dL (ref 5.7–8.2)
SODIUM: 137 mmol/L (ref 135–145)

## 2022-07-14 LAB — MAGNESIUM: MAGNESIUM: 1.7 mg/dL (ref 1.6–2.6)

## 2022-07-14 LAB — TACROLIMUS LEVEL, TIMED: TACROLIMUS BLOOD: 10.7 ng/mL

## 2022-07-14 LAB — GAMMA GT: GAMMA GLUTAMYL TRANSFERASE: 803 U/L — ABNORMAL HIGH

## 2022-07-14 LAB — HEPATIC FUNCTION PANEL
ALBUMIN: 3.1 g/dL — ABNORMAL LOW (ref 3.4–5.0)
ALKALINE PHOSPHATASE: 472 U/L — ABNORMAL HIGH (ref 46–116)
ALT (SGPT): 539 U/L — ABNORMAL HIGH (ref 10–49)
AST (SGOT): 588 U/L — ABNORMAL HIGH (ref <=34)
BILIRUBIN DIRECT: 0.3 mg/dL (ref 0.00–0.30)
BILIRUBIN TOTAL: 0.7 mg/dL (ref 0.3–1.2)
PROTEIN TOTAL: 6.2 g/dL (ref 5.7–8.2)

## 2022-07-14 LAB — CBC
HEMATOCRIT: 38.3 % — ABNORMAL LOW (ref 39.0–48.0)
HEMOGLOBIN: 13 g/dL (ref 12.9–16.5)
MEAN CORPUSCULAR HEMOGLOBIN CONC: 33.8 g/dL (ref 32.0–36.0)
MEAN CORPUSCULAR HEMOGLOBIN: 26.3 pg (ref 25.9–32.4)
MEAN CORPUSCULAR VOLUME: 77.6 fL (ref 77.6–95.7)
MEAN PLATELET VOLUME: 9.5 fL (ref 6.8–10.7)
PLATELET COUNT: 144 10*9/L — ABNORMAL LOW (ref 150–450)
RED BLOOD CELL COUNT: 4.93 10*12/L (ref 4.26–5.60)
RED CELL DISTRIBUTION WIDTH: 13.1 % (ref 12.2–15.2)
WBC ADJUSTED: 5.5 10*9/L (ref 3.6–11.2)

## 2022-07-14 LAB — PHOSPHORUS: PHOSPHORUS: 3.7 mg/dL (ref 2.4–5.1)

## 2022-07-14 LAB — EVEROLIMUS: EVEROLIMUS LEVEL: 11.2 ng/mL (ref 3.0–15.0)

## 2022-07-14 LAB — BILIRUBIN, DIRECT: BILIRUBIN DIRECT: 0.3 mg/dL (ref 0.00–0.30)

## 2022-07-14 MED ADMIN — diphenoxylate-atropine (LOMOTIL) 2.5-0.025 mg per tablet 1 tablet: 1 | ORAL | @ 02:00:00

## 2022-07-14 MED ADMIN — melatonin tablet 3 mg: 3 mg | ORAL | @ 02:00:00

## 2022-07-14 MED ADMIN — insulin NPH (HumuLIN,NovoLIN) injection 6 Units: 6 [IU] | SUBCUTANEOUS | @ 14:00:00

## 2022-07-14 MED ADMIN — tacrolimus (PROGRAF) capsule 3 mg: 3 mg | ORAL | @ 11:00:00

## 2022-07-14 MED ADMIN — tacrolimus (PROGRAF) capsule 2 mg: 2 mg | ORAL | @ 22:00:00

## 2022-07-14 MED ADMIN — insulin NPH (HumuLIN,NovoLIN) injection 6 Units: 6 [IU] | SUBCUTANEOUS | @ 02:00:00

## 2022-07-14 MED ADMIN — sodium chloride 0.9% (NS) bolus 1,000 mL: 1000 mL | INTRAVENOUS | @ 17:00:00 | Stop: 2022-07-14

## 2022-07-14 MED ADMIN — peg-electrolyte soln (GoLYTELY) solution 4,000 mL: 4000 mL | ORAL | @ 22:00:00 | Stop: 2022-07-14

## 2022-07-14 MED ADMIN — everolimus (immunosuppressive) (ZORTRESS) tablet 2 mg: 2 mg | ORAL | @ 11:00:00

## 2022-07-14 MED ADMIN — gabapentin (NEURONTIN) capsule 300 mg: 300 mg | ORAL | @ 20:00:00

## 2022-07-14 MED ADMIN — gabapentin (NEURONTIN) capsule 300 mg: 300 mg | ORAL | @ 02:00:00

## 2022-07-14 MED ADMIN — DULoxetine (CYMBALTA) DR capsule 60 mg: 60 mg | ORAL | @ 14:00:00

## 2022-07-14 MED ADMIN — gabapentin (NEURONTIN) capsule 300 mg: 300 mg | ORAL | @ 14:00:00

## 2022-07-14 MED ADMIN — amlodipine (NORVASC) tablet 10 mg: 10 mg | ORAL | @ 14:00:00

## 2022-07-14 MED ADMIN — everolimus (immunosuppressive) (ZORTRESS) tablet 1 mg: 1 mg | ORAL | @ 22:00:00

## 2022-07-14 MED ADMIN — aspirin chewable tablet 81 mg: 81 mg | ORAL | @ 14:00:00

## 2022-07-14 MED ADMIN — insulin lispro (HumaLOG) injection 0-20 Units: 0-20 [IU] | SUBCUTANEOUS | @ 22:00:00

## 2022-07-14 MED ADMIN — magnesium sulfate 2gm/50mL IVPB: 2 g | INTRAVENOUS | @ 14:00:00

## 2022-07-14 MED ADMIN — diphenoxylate-atropine (LOMOTIL) 2.5-0.025 mg per tablet 1 tablet: 1 | ORAL | @ 11:00:00 | Stop: 2022-07-14

## 2022-07-14 NOTE — Unmapped (Signed)
Pt [name], patient has no complaints of pain or discomfort, Pt reminded that he is now on clear liquid diet and will start his GI prep this evening. Multiple stool specimens sent (see previous note).    Problem: Adult Inpatient Plan of Care  Goal: Plan of Care Review  Outcome: Progressing  Goal: Patient-Specific Goal (Individualized)  Outcome: Progressing  Goal: Absence of Hospital-Acquired Illness or Injury  Outcome: Progressing  Intervention: Prevent Skin Injury  Recent Flowsheet Documentation  Taken 07/13/2022 2000 by Honor Junes, RN  Positioning for Skin: Supine/Back  Goal: Optimal Comfort and Wellbeing  Outcome: Progressing  Goal: Readiness for Transition of Care  Outcome: Progressing  Goal: Rounds/Family Conference  Outcome: Progressing     Problem: Infection  Goal: Absence of Infection Signs and Symptoms  Outcome: Progressing

## 2022-07-14 NOTE — Unmapped (Signed)
Transplant Surgery Progress Note    Hospital Day: 5    Assessment:     Matthew Key is a 61 y.o. 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 Celine Mans. He was admitted on 07/10/2022 after weekly post transplant labs revealed elevated transaminases and alk phos with persistent diarrhea for the last several weeks without improvement. Infectious workup negative.     Interval Events:     No acute events overnight. Pain is well controlled.  Vital signs are stable. Urine output is appropriate.  Patient having bowel movements. He has had frequent diarrhea after starting lomotil yesterday, 11x measured voids. GI consulted, planning for colonoscopy tomorrow. Acute rise in LFTs, will repeat labs to make sure this isn't lab error.    Plan:     Neuro:  - Pain well controlled  - Home gabapentin, duloxetine, APAP PRN, robaxin PRN    CV:  - HDS, Maintain SBP < 180  - Home amlodipine, ASA    Pulm:  - Stable on room air    GI:  - F: Medlock  - E: Replete as needed  - N: CLD for colonoscopy  - pantoprazole  - IV mag 2g daily  - hold lomotil for bowel prep  - GI consulted; appreciate recs   - planning for colonoscopy tomorrow   - GoLytely ordered   - start CLD today  - repeat LFTs this AM    GU:  - Voiding spontaneously    Endo:  - endocrine consulted, appreciate recs  - NPH 6U q12h  - lispro target 140, ISF 30 achs  - hypoglycemia protocol  - POCT-BG achs     Heme/ID:  - Afebrile, WBC and Hgb stable  - ppx with pentamidine (hold atovaquone)  - C. Diff, GI Path Panel, CMV, O&P negative  - CXR    MSK:  - PT consult for R hip pain: recommend outpatient PT prn    Immuno:  - tac, everolimus  - pharmacy recommendations for dosing      Dispo  - Floor      Objective:        Vital Signs:  BP 120/74  - Pulse 72  - Temp 37.1 ??C (98.8 ??F) (Oral)  - Resp 18  - Ht 172.7 cm (5' 8)  - Wt 83.9 kg (184 lb 15.5 oz)  - SpO2 96%  - BMI 28.12 kg/m??     Input/Output:  I/O last 3 completed shifts:  In: 920 [P.O.:920]  Out: 1400 [Urine:1400]    Physical Exam:    General: Cooperative, no distress, well appearing male, lying comfortably in bed  Pulmonary: Normal work of breathing, on room air  Cardiovascular: Regular rate  Abdomen: Soft, non-tender, non-distended. Surgical incision well-healed.   Musculoskeletal: Moves extremities spontaneously   Neurologic: Alert and interactive, grossly intact    Labs:  Lab Results   Component Value Date    WBC 5.5 07/14/2022    HGB 13.0 07/14/2022    HCT 38.3 (L) 07/14/2022    PLT 144 (L) 07/14/2022       Lab Results   Component Value Date    NA 137 07/13/2022    K 3.7 07/13/2022    CL 104 07/13/2022    CO2 25.0 07/13/2022    BUN 11 07/13/2022    CREATININE 1.02 07/13/2022    CALCIUM 8.7 07/13/2022    MG 1.6 07/13/2022    PHOS 3.5 07/13/2022  Microbiology Results (last day)       Procedure Component Value Date/Time Date/Time    C. Difficile Assay [[id number]]  (Normal) Collected: 07/11/22 0603    Lab Status: Final result Specimen: Stool  Updated: 07/11/22 0843     C. Diff Result Negative     Comment: Clostridium difficile NOT detected       Narrative:      The methodology of this test detects C. difficile toxin A and/or toxin B, by EIA.        GI Pathogen Panel [[id number]] Collected: 07/11/22 0603    Lab Status: In process Specimen: Stool  Updated: 07/11/22 0640    Comprehensive Ova and Parasite Exam [[id number]] Collected: 07/11/22 0605    Lab Status: In process Specimen: Stool  Updated: 07/11/22 0640            Imaging:  All pertinent imaging personally reviewed.    Montez Hageman, MD  PGY-1, General Surgery  [facility]

## 2022-07-14 NOTE — Unmapped (Cosign Needed)
Hepatology Consult Service   Initial Consultation         Assessment and Recommendations:   Matthew Key is a 61 y.o. 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 The patient is seen in consultation at the request of Chirag Lanney Gins, MD (Surg Transplant [facility])) for  diarrhea     Diarrhea   Pt reports has had diarrhea for several weeks now. In review of chart, he reported it more intermittently, with a period where it seemed to resolve. He denies any fever, abd, sick contact, significant changes to meds recent. He has not been using laxatives since diarrhea started. Colonoscopy in April with no overt mucosal findings to explain his diarrhea currently. He has had a fairly extensive infectious work up with stool studies, CMV Pcr, etc which have been negative. As such, unclear what the major provoking issues is. Could consider bile acid diarrhea, but using a bile acid sequestrant may not be ideal as timing of dosing is important, as it can interfere with absorption of other meds (ie immunosuppression). Lower suspicion for significant inflammatory process in the colon, but suppose possible. Clinical suspicion for EPI or celiac low. However, at this point he has continued to have on going diarrhea now for weeks, with an unclear etiology and so we recommend obtaining colonoscopy with random biopsies. Otherwise, he has not yet been on a decent standing regimen of anti-diarrheals and with infection seeming less likely we would recommend a trial of this as well, and primary team starting lomotil today.     -- Plan for colonoscopy on Monday with random biopsies (see below for prep instructions)   -- Will order stool K and stool Na (assess if more likely secretory or osmotic)  -- Primary team starting lomotil today, which is reasonable for symptoms. Of note, will likely not have a sense of efficacy while prepping for colonoscopy   -- Trend LFTs   -- Defer management of immunosuppression to SRF at this time     GI Pre-Procedure Checklist  Procedure: Colonoscopy  Anticipated Date of Procedure: 12/4  Anticoagulants/Antiplatelets: Not Applicable  Anesthesia Concerns: None  Diet: Order regular diet now, then clear liquids starting 12/3 and make NPO at 12AM (midnight) day of procedure  Prep: Give 4L Golytely bowel prep tomorrow afternoon. Order bowel prep using MED GI PROCEDURES PREP order-set. You must select the appropriate prep, this is not auto-selected. If Golytely is not available from pharmacy, please give Gatorade+Miralax prep. The patient is adequately prepped when their stool is clear and yellow, like urine. Continue to order bowel prep until the patients stools are clear. The patient can continue drinking prep past midnight if not clear, but must be strictly NPO of all oral intake for at least 2 hours before procedure.    Recommendations discussed with the patient's primary team. We will continue to follow along with you.    For questions, contact the on-call fellow for the Hepatology Consult Service.    Subjective:     Patient states that he was admitted to the hospital after being in touch with his transplant coordinator who noted significantly elevated liver enzymes.  He came to the hospital where he was also complaining of significant diarrhea.  He states that he has been having diarrhea now chronically for several weeks.  He states essentially every time he eats or drinks he has to go to the  bathroom and has large voluminous watery diarrhea.  He is going at least 7 times a day, with concerns for difficulty with p.o. intake and dehydration.    He denies any fevers, abdominal pain, recent suspicious travel or sick contact.  No significant changes to his medications recently.  He is not on mycophenolate anymore.  He does take magnesium oxide tablets which can exacerbate osmotic diarrhea.  He has been in touch with his transplant coordinator in regard to the symptoms of diarrhea.  Per chart review does sound like there were instances where the diarrhea actually resolved and improved significantly.  He has had workup including infectious testing which is all been negative.  No prior history of significant diarrhea.  In fact in the past was more on the constipated end of the spectrum.  Denies any passage of hard stool or feeling more constipated prior to the onset of diarrhea.  Was previously on laxatives in the past but stopped all of those when the diarrhea started.  He has used Imodium intermittently, states that earlier this week he took it once in the morning and then on an additional day he took it once in the morning and later again in the afternoon but has not been using on a significant basis by his report.  Though he does state the times where he did try Imodium that did not significantly improve his diarrhea.    -I have reviewed the patient's prior records as summarized in the HPI    Objective:   Temp:  [36.6 ??C (97.9 ??F)-37.2 ??C (99 ??F)] 36.9 ??C (98.4 ??F)  Heart Rate:  [75-87] 87  Resp:  [17-18] 18  BP: (134-156)/(68-79) 135/76  SpO2:  [96 %-99 %] 99 %    Gen NAD, answers questions appropriately  Eyes: Sclera anicteric  Abdomen: Normoactive bowel sounds, soft, NTND, no rebound/guarding   Extremities: No clubbing, cyanosis, or edema in the BLEs  Neuro: Normal speech  Skin: No rashes, lesions on clothed exam  Psych: Alert, normal mood and affect.     Pertinent Labs/Studies Reviewed:  Lab Results   Component Value Date    WBC 6.9 07/13/2022    HGB 11.7 (L) 07/13/2022    HCT 35.5 (L) 07/13/2022    PLT 142 (L) 07/13/2022       Lab Results   Component Value Date    NA 137 07/13/2022    K 3.7 07/13/2022    CL 104 07/13/2022    CO2 25.0 07/13/2022    BUN 11 07/13/2022    CREATININE 1.02 07/13/2022    GLU 156 07/13/2022    CALCIUM 8.7 07/13/2022    MG 1.6 07/13/2022    PHOS 3.5 07/13/2022       Lab Results   Component Value Date    BILITOT 0.3 07/13/2022    BILIDIR 0.10 07/13/2022    PROT 6.1 07/13/2022    ALBUMIN 3.2 (L) 07/13/2022    ALT 120 (H) 07/13/2022    AST 29 07/13/2022    ALKPHOS 257 (H) 07/13/2022    GGT 273 (H) 07/13/2022       Lab Results   Component Value Date    PT 10.8 07/10/2022    INR 0.96 07/10/2022    APTT 33.4 07/10/2022

## 2022-07-15 DIAGNOSIS — Z944 Liver transplant status: Principal | ICD-10-CM

## 2022-07-15 DIAGNOSIS — Z5181 Encounter for therapeutic drug level monitoring: Principal | ICD-10-CM

## 2022-07-15 DIAGNOSIS — E612 Magnesium deficiency: Principal | ICD-10-CM

## 2022-07-15 LAB — CBC
HEMATOCRIT: 35.2 % — ABNORMAL LOW (ref 39.0–48.0)
HEMOGLOBIN: 11.9 g/dL — ABNORMAL LOW (ref 12.9–16.5)
MEAN CORPUSCULAR HEMOGLOBIN CONC: 33.7 g/dL (ref 32.0–36.0)
MEAN CORPUSCULAR HEMOGLOBIN: 26.4 pg (ref 25.9–32.4)
MEAN CORPUSCULAR VOLUME: 78.3 fL (ref 77.6–95.7)
MEAN PLATELET VOLUME: 9.8 fL (ref 6.8–10.7)
PLATELET COUNT: 126 10*9/L — ABNORMAL LOW (ref 150–450)
RED BLOOD CELL COUNT: 4.5 10*12/L (ref 4.26–5.60)
RED CELL DISTRIBUTION WIDTH: 13.2 % (ref 12.2–15.2)
WBC ADJUSTED: 6.6 10*9/L (ref 3.6–11.2)

## 2022-07-15 LAB — COMPREHENSIVE METABOLIC PANEL
ALBUMIN: 3.3 g/dL — ABNORMAL LOW (ref 3.4–5.0)
ALKALINE PHOSPHATASE: 415 U/L — ABNORMAL HIGH (ref 46–116)
ALT (SGPT): 340 U/L — ABNORMAL HIGH (ref 10–49)
ANION GAP: 6 mmol/L (ref 5–14)
AST (SGOT): 151 U/L — ABNORMAL HIGH (ref <=34)
BILIRUBIN TOTAL: 0.7 mg/dL (ref 0.3–1.2)
BLOOD UREA NITROGEN: 8 mg/dL — ABNORMAL LOW (ref 9–23)
BUN / CREAT RATIO: 8
CALCIUM: 9 mg/dL (ref 8.7–10.4)
CHLORIDE: 107 mmol/L (ref 98–107)
CO2: 27 mmol/L (ref 20.0–31.0)
CREATININE: 0.95 mg/dL
EGFR CKD-EPI (2021) MALE: 90 mL/min/{1.73_m2} (ref >=60)
GLUCOSE RANDOM: 115 mg/dL (ref 70–179)
POTASSIUM: 3.8 mmol/L (ref 3.4–4.8)
PROTEIN TOTAL: 6.1 g/dL (ref 5.7–8.2)
SODIUM: 140 mmol/L (ref 135–145)

## 2022-07-15 LAB — MAGNESIUM: MAGNESIUM: 1.7 mg/dL (ref 1.6–2.6)

## 2022-07-15 LAB — BILIRUBIN, DIRECT: BILIRUBIN DIRECT: 0.3 mg/dL (ref 0.00–0.30)

## 2022-07-15 LAB — GAMMA GT: GAMMA GLUTAMYL TRANSFERASE: 531 U/L — ABNORMAL HIGH

## 2022-07-15 LAB — PHOSPHORUS: PHOSPHORUS: 3.3 mg/dL (ref 2.4–5.1)

## 2022-07-15 MED ADMIN — tacrolimus (PROGRAF) capsule 2 mg: 2 mg | ORAL | @ 23:00:00

## 2022-07-15 MED ADMIN — insulin lispro (HumaLOG) injection 0-20 Units: 0-20 [IU] | SUBCUTANEOUS | @ 23:00:00

## 2022-07-15 MED ADMIN — gabapentin (NEURONTIN) capsule 300 mg: 300 mg | ORAL | @ 13:00:00

## 2022-07-15 MED ADMIN — insulin NPH (HumuLIN,NovoLIN) injection 6 Units: 6 [IU] | SUBCUTANEOUS | @ 02:00:00

## 2022-07-15 MED ADMIN — propofol (DIPRIVAN) infusion 10 mg/mL: INTRAVENOUS | @ 16:00:00 | Stop: 2022-07-15

## 2022-07-15 MED ADMIN — everolimus (immunosuppressive) (ZORTRESS) tablet 2 mg: 2 mg | ORAL | @ 11:00:00

## 2022-07-15 MED ADMIN — gabapentin (NEURONTIN) capsule 300 mg: 300 mg | ORAL | @ 20:00:00

## 2022-07-15 MED ADMIN — everolimus (immunosuppressive) (ZORTRESS) tablet 1 mg: 1 mg | ORAL | @ 23:00:00

## 2022-07-15 MED ADMIN — aspirin chewable tablet 81 mg: 81 mg | ORAL | @ 13:00:00 | Stop: 2022-07-15

## 2022-07-15 MED ADMIN — sodium chloride 0.9% (NS) bolus 1,000 mL: 1000 mL | INTRAVENOUS | @ 10:00:00 | Stop: 2022-07-15

## 2022-07-15 MED ADMIN — insulin NPH (HumuLIN,NovoLIN) injection 6 Units: 6 [IU] | SUBCUTANEOUS | @ 14:00:00

## 2022-07-15 MED ADMIN — lidocaine (XYLOCAINE) 20 mg/mL (2 %) injection: INTRAVENOUS | @ 16:00:00 | Stop: 2022-07-15

## 2022-07-15 MED ADMIN — DULoxetine (CYMBALTA) DR capsule 60 mg: 60 mg | ORAL | @ 13:00:00

## 2022-07-15 MED ADMIN — amlodipine (NORVASC) tablet 10 mg: 10 mg | ORAL | @ 14:00:00

## 2022-07-15 MED ADMIN — sodium chloride (NS) 0.9 % infusion: 100 mL/h | INTRAVENOUS | @ 05:00:00 | Stop: 2022-07-15

## 2022-07-15 MED ADMIN — potassium chloride ER tablet 20 mEq: 20 meq | ORAL | @ 12:00:00 | Stop: 2022-07-15

## 2022-07-15 MED ADMIN — Propofol (DIPRIVAN) injection: INTRAVENOUS | @ 16:00:00 | Stop: 2022-07-15

## 2022-07-15 MED ADMIN — melatonin tablet 3 mg: 3 mg | ORAL | @ 02:00:00

## 2022-07-15 MED ADMIN — lactated Ringers infusion: 10 mL/h | INTRAVENOUS | @ 15:00:00

## 2022-07-15 MED ADMIN — tacrolimus (PROGRAF) capsule 3 mg: 3 mg | ORAL | @ 11:00:00

## 2022-07-15 MED ADMIN — melatonin tablet 3 mg: 3 mg | ORAL | @ 23:00:00

## 2022-07-15 MED ADMIN — gabapentin (NEURONTIN) capsule 300 mg: 300 mg | ORAL | @ 02:00:00

## 2022-07-15 MED ADMIN — magnesium sulfate 2gm/50mL IVPB: 2 g | INTRAVENOUS | @ 13:00:00

## 2022-07-15 NOTE — Unmapped (Signed)
Transplant Surgery Progress Note    Hospital Day: 6    Assessment:     Matthew Key is a 61 y.o. 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 Celine Mans. He was admitted on 07/10/2022 after weekly post transplant labs revealed elevated transaminases and alk phos with persistent diarrhea for the last several weeks without improvement. Infectious workup negative.     Interval Events:     No acute events overnight. Pain is well controlled.  Vital signs are stable. Urine output is appropriate.  Patient having bowel movements. Continues to have diarrhea prior to starting bowel prep. Finished bowel prep for colonoscopy today.     Plan:     Neuro:  - Pain well controlled  - Home gabapentin, duloxetine, APAP PRN, robaxin PRN    CV:  - HDS, Maintain SBP < 180  - Home amlodipine, ASA    Pulm:  - Stable on room air    GI:  - F: NS @ 175mL/hr  - E: Replete as needed  - N: NPO for colonoscopy   - resume regular diet after scope  - pantoprazole  - IV mag 2g daily  - hold lomotil for bowel prep  - GI consulted; appreciate recs   - NPO for planned colonoscopy today   - finished GoLytely   - LFTs downtrending after sharp increase yesterday   - consult VIR for percutaneous liver biopsy    GU:  - Voiding spontaneously    Endo:  - endocrine consulted, appreciate recs  - NPH 6U q12h  - lispro target 140, ISF 30 achs  - hypoglycemia protocol  - POCT-BG achs     Heme/ID:  - Afebrile, WBC and Hgb stable  - ppx with pentamidine (hold atovaquone)  - C. Diff, GI Path Panel, CMV, O&P negative  - holding dvt ppx for procedures    MSK:  - PT consult for R hip pain: recommend outpatient PT prn    Immuno:  - tac, everolimus  - pharmacy recommendations for dosing      Dispo  - Floor      Objective:        Vital Signs:  BP 159/73  - Pulse 65  - Temp 36.5 ??C (97.7 ??F) (Oral)  - Resp 17  - Ht 172.7 cm (5' 8)  - Wt 85.7 kg (189 lb)  - SpO2 96%  - BMI 28.74 kg/m??     Input/Output:  I/O last 3 completed shifts:  In: -   Out: 850 [Urine:850]    Physical Exam:    General: Cooperative, no distress, well appearing male, lying comfortably in bed  Pulmonary: Normal work of breathing, on room air  Cardiovascular: Regular rate  Abdomen: Soft, non-tender, non-distended. Surgical incision well-healed.   Musculoskeletal: Moves extremities spontaneously   Neurologic: Alert and interactive, grossly intact    Labs:  Lab Results   Component Value Date    WBC 6.6 07/15/2022    HGB 11.9 (L) 07/15/2022    HCT 35.2 (L) 07/15/2022    PLT 126 (L) 07/15/2022       Lab Results   Component Value Date    NA 140 07/15/2022    K 3.8 07/15/2022    CL 107 07/15/2022    CO2 27.0 07/15/2022    BUN 8 (L) 07/15/2022    CREATININE 0.95 07/15/2022    CALCIUM 9.0 07/15/2022    MG 1.7 07/15/2022  PHOS 3.3 07/15/2022       Microbiology Results (last day)       Procedure Component Value Date/Time Date/Time    C. Difficile Assay [[id number]]  (Normal) Collected: 07/11/22 0603    Lab Status: Final result Specimen: Stool  Updated: 07/11/22 0843     C. Diff Result Negative     Comment: Clostridium difficile NOT detected       Narrative:      The methodology of this test detects C. difficile toxin A and/or toxin B, by EIA.        GI Pathogen Panel [[id number]] Collected: 07/11/22 0603    Lab Status: In process Specimen: Stool  Updated: 07/11/22 0640    Comprehensive Ova and Parasite Exam [[id number]] Collected: 07/11/22 0605    Lab Status: In process Specimen: Stool  Updated: 07/11/22 0640            Imaging:  All pertinent imaging personally reviewed.    Montez Hageman, MD  PGY-1, General Surgery  [facility]

## 2022-07-15 NOTE — Unmapped (Addendum)
Everolimus Therapeutic Monitoring Pharmacy Note     Matthew Key is a 61 y.o. male continuing tacrolimus.      Indication: Liver transplant      Date of Transplant:  02/06/22       Prior Dosing Information: Everolimus 2 mg AM, 1 mg PM    Source(s) of information used to determine prior to admission dosing: Home Medication List     Goals:  Therapeutic Drug Levels  Everolimus goal: 3-8 ng/mL     Additional Clinical Monitoring/Outcomes  Monitor renal function (SCr and urine output) and liver function (LFTs)  Monitor for signs/symptoms of adverse events (e.g., hyperglycemia, hyperkalemia, hypomagnesemia, hypertension, headache, tremor)     Results:   Everolimus level: 11.1 ng/ml (inaccurate level; drawn 1H after AM dose today)     Pharmacokinetic Considerations and Significant Drug Interactions:  Concurrent hepatotoxic medications: None identified  Concurrent CYP3A4 substrates/inhibitors: None identified  Concurrent nephrotoxic medications:  tacrolimus     Assessment/Plan:  Recommendedation(s)  Continue current regimen of everolimus 2 mg in AM and 1 mg in PM per [facility] Transplant attending given inaccurate level     Follow-up  Tac level ordered every other da.   A pharmacist will continue to monitor and recommend levels as appropriate     Please page service pharmacist with questions/clarifications.     Pete Glatter, PharmD  PGY-2 Solid Organ Transplant Pharmacy Resident

## 2022-07-15 NOTE — Unmapped (Signed)
INTERVENTIONAL RADIOLOGY - Liver Biopsy Consultation Note    Subjective:       Reason for Liver Biopsy:  1 Elevated Liver Function Test    Requested approach: 1 Percutaneous     Requesting team:  Surgery     HPI:   Matthew Key is a 61 y.o. male with a history of OLT January 17 2022, DM2, duodenal ulcer, CVA who presents for diarrhea and found to have elevated AST, ALT, and Alk Phos.    Patient seen in consultation at the request of primary care team for consideration for percutaneous liver biopsy to evaluate etiology of elevated liver enzymes.      Objective:      Imaging Studies:  US Liver Transplant 07/14/2022 , reviewed with Dr. Trude Mcburney    Pertinent Laboratory Values:  WBC   Date Value Ref Range Status   07/15/2022 6.6 3.6 - 11.2 10*9/L Final   07/09/2022 6.7 3.4 - 10.8 x10E3/uL Final     HGB   Date Value Ref Range Status   07/15/2022 11.9 (L) 12.9 - 16.5 g/dL Final   16/05/9603 54.0 (L) 13.0 - 17.7 g/dL Final     Hemoglobin   Date Value Ref Range Status   02/06/2022 9.5 (L) 13.5 - 17.5 g/dL Final     HCT   Date Value Ref Range Status   07/15/2022 35.2 (L) 39.0 - 48.0 % Final   07/09/2022 39.6 37.5 - 51.0 % Final     Platelet   Date Value Ref Range Status   07/15/2022 126 (L) 150 - 450 10*9/L Final   07/09/2022 212 150 - 450 x10E3/uL Final     INR   Date Value Ref Range Status   07/10/2022 0.96  Final   02/04/2022 1.6 (H) 0.9 - 1.2 Final     Comment:     Reference interval is for non-anticoagulated patients.  Suggested INR therapeutic range for Vitamin K  antagonist therapy:     Standard Dose (moderate intensity                    therapeutic range):       2.0 - 3.0     Higher intensity therapeutic range       2.5 - 3.5       Creatinine   Date Value Ref Range Status   07/15/2022 0.95 0.73 - 1.18 mg/dL Final   98/06/9146 8.29 (H) 0.76 - 1.27 mg/dL Final     Total Bilirubin   Date Value Ref Range Status   07/15/2022 0.7 0.3 - 1.2 mg/dL Final   56/21/3086 0.7 0.0 - 1.2 mg/dL Final     LDH   Date Value Ref Range Status 02/05/2022 172 120 - 246 U/L Final     AST   Date Value Ref Range Status   07/15/2022 151 (H) <=34 U/L Final   07/09/2022 266 (H) 0 - 40 IU/L Final     ALT   Date Value Ref Range Status   07/15/2022 340 (H) 10 - 49 U/L Final   07/09/2022 163 (H) 0 - 44 IU/L Final         Allergies:     Allergies   Allergen Reactions    Venom-Honey Bee Swelling       Physical Exam:    Vitals:    07/15/22 0950   BP: 146/86   Pulse: 69   Resp:    Temp: 36.3 ??C (97.3 ??F)   SpO2: 98%  General: male in NAD.  Airway assessment: Class 1 - Can visualize soft palate, fauces, uvula, and tonsillar pillars  Lungs: Comfortably breathing on room air.  ASA Grade: ASA 3 - Patient with moderate systemic disease with functional limitations    Assessment:     Matthew Key is a 61 y.o. male with a history of OLT January 17 2022, DM2, duodenal ulcer, CVA who presents for diarrhea and found to have elevated AST, ALT, and Alk Phos.    Patient seen in consultation at the request of primary care team for consideration for percutaneous liver biopsy to evaluate etiology of elevated liver enzymes.  Pertinent history, imaging and laboratory values in patient's medical record have been reviewed.     Ultrasound did not reveal ascites.    Plan/Recommendations:       -VIR recommends proceeding with non-targeted liver biopsy via 1 Percutaneous approach with moderate sedation.   - Anticipated procedure date: 07/16/2022 vs 07/17/2022  - Please make NPO night prior to procedure  - Please ensure recent CBC, Creatinine, and INR are available      Informed Consent:  This procedure and sedation has been fully reviewed with the patient/patient???s authorized representative. The risks, benefits and alternatives including but not limited to bleeding, infection, damage to adjacent structures, and/or potential need to assess vasculature with fluoroscopy have been explained, and the patient/patient???s authorized representative has consented to the procedure. Consent obtained by Lindajo Royal, MD, witnessed and scanned into patient's medical record.   --The patient will accept blood products in an emergent situation.  --The patient does not have a Do Not Resuscitate order in effect.      The patient was discussed with Dr. Trude Mcburney.     Thank you for involving [place] in the care of this patient. Please page the VIR consult pager [phone]) with further questions, concerns, or if new issues arise.      Lindajo Royal, MD, July 15, 2022, 10:18 AM

## 2022-07-15 NOTE — Unmapped (Addendum)
[facility] Pharmacist has reviewed a new prescription for tacrolimus that indicates a dose decrease.  Patient was counseled on this dosage change by RL- see epic note from 07/13/22.  Next refill call date adjusted if necessary.          Clinical Assessment Needed For: Dose Change  Medication: Tacrolimus 1mg  capsule  Last Fill Date/Day Supply: 06/21/2022 / 30 days  Copay $0  Was previous dose already scheduled to fill: No    Notes to Pharmacist: N/A

## 2022-07-15 NOTE — Unmapped (Signed)
Tacrolimus Therapeutic Monitoring Pharmacy Note    Matthew Key is a 61 y.o. male continuing tacrolimus.     Indication: Liver transplant     Date of Transplant:  02/06/22       Prior Dosing Information: Tacrolimus 3 mg AM, 2 mg PM    Source(s) of information used to determine prior to admission dosing: Home Medication List    Goals:  Therapeutic Drug Levels  Tacrolimus trough goal: 3-5 ng/mL    Additional Clinical Monitoring/Outcomes  Monitor renal function (SCr and urine output) and liver function (LFTs)  Monitor for signs/symptoms of adverse events (e.g., hyperglycemia, hyperkalemia, hypomagnesemia, hypertension, headache, tremor)    Results:   Tacrolimus level: 10.7 ng/ml (inaccurate level; drawn 1H after AM dose today)    Pharmacokinetic Considerations and Significant Drug Interactions:  Concurrent hepatotoxic medications: None identified  Concurrent CYP3A4 substrates/inhibitors:  amlodipine  Concurrent nephrotoxic medications:  everolimus    Assessment/Plan:  Recommendedation(s)  Continue current regimen of Tacrolimus 3 mg AM, 2 mg PM per [facility] Transplant attending given inaccurate level    Follow-up  Tac level ordered every other day .   A pharmacist will continue to monitor and recommend levels as appropriate    Please page service pharmacist with questions/clarifications.    Pete Glatter, PharmD  PGY-2 Solid Organ Transplant Pharmacy Resident

## 2022-07-15 NOTE — Unmapped (Signed)
Endocrine Team Diabetes Follow Up Consult Note     Consult information:  Requesting Attending Physician : Florene Glen, MD  Service Requesting Consult : Surg Transplant [phone])  Primary Care Provider: Card, [name], MD  Impression:  Matthew Key is a 61 y.o. male admitted for diarrhea. We have been consulted at the request of Chirag Lanney Gins, MD to evaluate Matthew Key for hyperglycemia.     Medical Decision Making:  Diagnoses:  1.Type 2 Diabetes. Uncontrolled With hyperglycemia.  2. Nutrition: Complicating glycemic control. Increasing risk for both hypoglycemia and hyperglycemia.  3. Transplant. Complicating glycemic control and increasing risk for hyperglycemia.  4.  Elevated transaminases. Complicating glycemic control and increasing risk for hyperglycemia and hypoglycemia  5. History of secondary adrenal insufficiency.       Studies reviewed 07/15/22:  Labs: CBC, BMP, POCT-BG, and HbA1C  Interpretation: WBC normal. Electrolytes normal. Elevated transaminases. Hyperglycemia, some severe. A1C indicates excellent control at home.     Notes reviewed: Primary team and nursing notes      Overall impression based on above reviews and history:  BG doing well overall. Basal dose reduced for poor po intake. As noted he had ozempic on board till Sunday (12/3). Will follow closely today and add nutritional insulin if warranted.     Recommendations:  - NPH 6u q12h  - lispro target 140, ISF 30 achs  - Hypoglycemia protocol.  - POCT-BG achs.  - Ensure patient is on glucose precautions if patient taking nutrition by mouth.     Discharge planning:  Will most likely discharge back on home regimen given good control and no hypoglycemia. Discharge med rec updated 12/1.    Thank you for this consult. Discussed plan with primary team. We will continue to follow and make recommendations and place orders as appropriate.    Please page with questions or concerns: Meliya Mcconahy, NP: [phone]  DCT on call from 6AM - 3PM on weekdays then endocrine fellow on call: [phone] from 3PM - 6AM on weekdays and on weekends and holidays.   If APP cannot be reached, please page the endocrine fellow on call.      Subjective:  Interval history: BG doing well. Currently NPO for colonoscopy. Otherwise tolerating CLD.  Continues to have diarrhea. Denies complaints at time of rounds.      Initial HPI:  Matthew Key is a 61 y.o. male with a PMH of  has a past medical history of AKI (acute kidney injury) (CMS-HCC) (12/14/2020), Anxiety (10/22/2013), Arthritis, Cervical radiculopathy (12/03/2016), Chronic pain disorder, Cirrhosis (CMS-HCC), Dental abscess (10/2020), Duodenal ulcer (12/01/2017), GERD (gastroesophageal reflux disease), History of transfusion, Hyperlipidemia (10/22/2013), Hypertension, Hypotension (01/29/2022), Liver disease, Sleep apnea, obstructive, Stroke (CMS-HCC), and Type 2 diabetes mellitus with diabetic neuropathy, with long-term current use of insulin (CMS-HCC) (06/09/2014). admitted on 07/10/2022 for an after weekly post transplant labs revealed elevated transaminases and alk phos with persistent diarrhea for the last several weeks without improvement.     Diabetes History:  Patient has a history of Type 2 diabetes diagnosed 2017.  Diabetes is managed by: PCP.  Current home diabetes regimen: tresiba 13 units at bedtime, novolog correction and ozempic 1mg  qweek (Sundays, last dose 11/26).. Of note he was on ozempic before.   Current home blood glucose monitoring:  3-4 times a day.  Hypoglycemia awareness: yes.  Complications related to diabetes: peripheral neuropathy      Endocrine History: History of secondary adrenal insufficiency. Had been receiving steroids steroids for spinal injections.  We saw back in January of this year and noted HPA axis recovery based on ACTH stim test (cortisol went from 8.4-> 22.9). Since then he was prescribed hydrocortisone 10mg  every day prn illness, but states he hasn't taken any. Has not taken any steroids this calendar year (2023).    Current Nutrition:  Active Orders   Diet    NPO Sips with meds; Procedure/Test       ROS: As per HPI.     amlodipine  10 mg Oral Daily    aspirin  81 mg Oral Daily    DULoxetine  60 mg Oral Daily    everolimus (immunosuppressive)  1 mg Oral Nightly    everolimus  2 mg Oral daily    gabapentin  300 mg Oral TID    insulin lispro  0-20 Units Subcutaneous ACHS    insulin NPH  6 Units Subcutaneous Q12H    magnesium sulfate  2 g Intravenous Daily    melatonin  3 mg Oral QPM    tacrolimus  2 mg Oral Nightly    tacrolimus  3 mg Oral Daily       Current Outpatient Medications   Medication Instructions    acetaminophen (TYLENOL) 325-650 mg, Oral, Every 6 hours PRN    amlodipine (NORVASC) 10 mg, Oral, Daily (standard)    aspirin (ECOTRIN) 81 mg, Oral, Daily (standard)    atorvastatin (LIPITOR) 40 mg, Oral, Daily (standard), Hold until directed to take by nurse coordinator    atovaquone (MEPRON) 1,500 mg, Oral, Daily (standard)    blood sugar diagnostic (ACCU-CHEK GUIDE TEST STRIPS) Strp Use to check blood sugar as directed with insulin 3 times a day & for symptoms of high or low blood sugar    blood-glucose meter kit Use as instructed    blood-glucose meter kit Use as instructed    blood-glucose meter kit Use as instructed    blood-glucose meter Misc Use to test blood sugar as directed    DULoxetine (CYMBALTA) 60 mg, Oral, Daily (standard)    everolimus, immunosuppressive, (ZORTRESS) 0.5 mg tablet Take 4 tablets (2 mg total) by mouth daily AND 2 tablets (1 mg total) nightly.    gabapentin (NEURONTIN) 300 mg, Oral, 3 times a day (standard)    glucagon spray 3 mg/actuation Spry Use 1 spray into the left nostril every fifteen (15) minutes as needed (low blood sugar).    hydrOXYzine (VISTARIL) 50 mg, Oral, Nightly PRN    insulin ASPART (NOVOLOG FLEXPEN) 100 unit/mL (3 mL) injection pen Inject under the skin per sliding scale prior to meals. If premeal BG 151-200 take 2 additional units for correction, if 201-250 take 4 additional units, etc. Max dose 36 units per day.  Store in-use prefilled pens at room temperature <86??F and use within 28 days; do not refrigerate.    lancets (ACCU-CHEK SOFTCLIX LANCETS) Misc Use to check blood sugar as directed with insulin 3 times a day & for symptoms of high or low blood sugar.    magnesium oxide-Mg AA chelate (MAGNESIUM, AMINO ACID CHELATE,) 133 mg 2 tablets, Oral, 2 times a day    melatonin 3 mg, Oral, Every evening    methocarbamoL (ROBAXIN) 500 mg, Oral, 3 times a day PRN    OZEMPIC 0.5 mg, Subcutaneous, Every 7 days    pantoprazole (PROTONIX) 40 mg, Oral, Daily PRN    patiromer calcium sorbitex (VELTASSA) 16.8 g, Oral, 2 times a day    pen needle, diabetic (BD ULTRA-FINE NANO PEN NEEDLE) 32  gauge x 5/32 (4 mm) Ndle Use as directed  1-2 times per day    polyethylene glycol (MIRALAX) 17 gram/dose powder Mix 17 g (use measure line in cap) in 4-8 ounces of water, juice, soda, coffee, or tea and take by mouth daily as needed.    tacrolimus (PROGRAF) 1 MG capsule Take 3 capsules (3 mg total) by mouth daily AND 2 capsules (2 mg total) nightly.    TRESIBA FLEXTOUCH U-100 13 Units, Subcutaneous, Daily (standard), Adjust as instructed.    VELTASSA 16.8 g, Oral, Daily (standard)           Past Medical History:   Diagnosis Date    AKI (acute kidney injury) (CMS-HCC) 12/14/2020    Anxiety 10/22/2013    Arthritis     Cervical radiculopathy 12/03/2016    Chronic pain disorder     Lower back    Cirrhosis (CMS-HCC)     Dental abscess 10/2020    Duodenal ulcer 12/01/2017    GERD (gastroesophageal reflux disease)     History of transfusion     Hyperlipidemia 10/22/2013    Hypertension     under control with meds and weight loss    Hypotension 01/29/2022    Liver disease     Sleep apnea, obstructive     Have machine    Stroke (CMS-HCC)     mild stroke    Type 2 diabetes mellitus with diabetic neuropathy, with long-term current use of insulin (CMS-HCC) 06/09/2014 Past Surgical History:   Procedure Laterality Date    KNEE SURGERY      PR CATH PLACE/CORON ANGIO, IMG SUPER/INTERP,R&L HRT CATH, L HRT VENTRIC N/A 12/19/2020    Procedure: CATH LEFT/RIGHT HEART CATHETERIZATION;  Surgeon: Rosana Hoes, MD;  Location: [facility] CATH;  Service: Cardiology    PR COLONOSCOPY FLX DX W/COLLJ SPEC WHEN PFRMD N/A 12/07/2021    Procedure: COLONOSCOPY, FLEXIBLE, PROXIMAL TO SPLENIC FLEXURE; DIAGNOSTIC, W/WO COLLECTION SPECIMEN BY BRUSH OR WASH;  Surgeon: Annie Paras, MD;  [facility]: [facility];  Service: Gastroenterology    PR TRANSPLANT LIVER,ALLOTRANSPLANT N/A 02/06/2022    Procedure: LIVER ALLOTRANSPLANTATION; ORTHOTOPIC, PARTIAL OR WHOLE, FROM CADAVER OR LIVING DONOR, ANY AGE;  Surgeon: Florene Glen, MD;  Location: MAIN OR [place];  Service: Transplant    PR TRANSPLANT,PREP DONOR LIVER/ARTERIAL N/A 02/06/2022    Procedure: BACKBNCH RECONSTRUCT OF CAD/LIVE DONOR LIVER GFT PRIOR TRANSPLANT; ARTERIAL ANASTAMOSIS, EA;  Surgeon: Florene Glen, MD;  Location: MAIN OR [place];  Service: Transplant    PR UPPER GI ENDOSCOPY,BIOPSY N/A 10/12/2020    Procedure: UGI ENDOSCOPY; WITH BIOPSY, SINGLE OR MULTIPLE;  Surgeon: Marene Lenz, MD;  Location: [facility];  Service: Gastroenterology    PR UPPER GI ENDOSCOPY,DIAGNOSIS N/A 01/03/2022    Procedure: UGI ENDO, INCLUDE ESOPHAGUS, STOMACH, & DUODENUM &/OR JEJUNUM; DX W/WO COLLECTION SPECIMN, BY BRUSH OR WASH;  Surgeon: Marene Lenz, MD;  [facility]: [facility];  Service: Gastroenterology    PR UPPER GI ENDOSCOPY,LIGAT VARIX N/A 12/07/2021    Procedure: UGI ENDO; Everlene Balls LIG ESOPH &/OR GASTRIC VARICES;  Surgeon: Annie Paras, MD;  Location: GI PROC[facility]URES [facility];  Service: Gastroenterology    ROOT CANAL      Front teeth       Family History   Problem Relation Age of Onset    Edema Mother     Alzheimer's disease Father     Aortic dissection Brother Early death Brother  Aneurysm Brother        Social History     Tobacco Use    Smoking status: Never    Smokeless tobacco: Never   Vaping Use    Vaping Use: Never used   Substance Use Topics    Alcohol use: Not Currently    Drug use: Not Currently       OBJECTIVE:  BP 159/73  - Pulse 65  - Temp 36.5 ??C (97.7 ??F) (Oral)  - Resp 17  - Ht 172.7 cm (5' 8)  - Wt 85.7 kg (189 lb)  - SpO2 96%  - BMI 28.74 kg/m??   Wt Readings from Last 12 Encounters:   07/15/22 85.7 kg (189 lb)   05/06/22 87.5 kg (193 lb)   04/08/22 86.7 kg (191 lb 1.6 oz)   04/08/22 86.7 kg (191 lb 1.6 oz)   03/11/22 86 kg (189 lb 11.2 oz)   02/25/22 88.6 kg (195 lb 4.8 oz)   02/25/22 88.6 kg (195 lb 4.8 oz)   02/18/22 96.3 kg (212 lb 3.2 oz)   02/11/22 94.8 kg (209 lb)   01/29/22 96.9 kg (213 lb 11.2 oz)   01/14/22 89.8 kg (198 lb)   01/03/22 88.9 kg (196 lb)     Physical Exam  Constitutional:       Appearance: Normal appearance.   Pulmonary:      Effort: Pulmonary effort is normal. No respiratory distress.   Skin:     General: Skin is warm and dry.   Neurological:      General: No focal deficit present.      Mental Status: He is alert and oriented to person, place, and time.             BG/insulin reviewed per EMR.   Glucose, POC (mg/dL)   Date Value   16/05/9603 126   07/14/2022 104   07/14/2022 178   07/14/2022 99   07/14/2022 119   07/13/2022 145   07/13/2022 229 (H)   07/13/2022 162        Summary of labs:  Lab Results   Component Value Date    A1C 6.5 (H) 05/06/2022    A1C 5.2 02/05/2022    A1C 4.7 (L) 01/14/2022     Lab Results   Component Value Date    CREATININE 0.95 07/15/2022     Lab Results   Component Value Date    WBC 6.6 07/15/2022    HGB 11.9 (L) 07/15/2022    HCT 35.2 (L) 07/15/2022    PLT 126 (L) 07/15/2022       Lab Results   Component Value Date    NA 140 07/15/2022    K 3.8 07/15/2022    CL 107 07/15/2022    CO2 27.0 07/15/2022    BUN 8 (L) 07/15/2022    CREATININE 0.95 07/15/2022    GLU 115 07/15/2022    CALCIUM 9.0 07/15/2022 MG 1.7 07/15/2022    PHOS 3.3 07/15/2022       Lab Results   Component Value Date    BILITOT 0.7 07/15/2022    BILIDIR 0.30 07/15/2022    PROT 6.1 07/15/2022    ALBUMIN 3.3 (L) 07/15/2022    ALT 340 (H) 07/15/2022    AST 151 (H) 07/15/2022    ALKPHOS 415 (H) 07/15/2022    GGT 531 (H) 07/15/2022

## 2022-07-16 LAB — COMPREHENSIVE METABOLIC PANEL
ALBUMIN: 2.9 g/dL — ABNORMAL LOW (ref 3.4–5.0)
ALKALINE PHOSPHATASE: 313 U/L — ABNORMAL HIGH (ref 46–116)
ALT (SGPT): 190 U/L — ABNORMAL HIGH (ref 10–49)
ANION GAP: 5 mmol/L (ref 5–14)
AST (SGOT): 42 U/L — ABNORMAL HIGH (ref <=34)
BILIRUBIN TOTAL: 0.3 mg/dL (ref 0.3–1.2)
BLOOD UREA NITROGEN: 11 mg/dL (ref 9–23)
BUN / CREAT RATIO: 9
CALCIUM: 8.6 mg/dL — ABNORMAL LOW (ref 8.7–10.4)
CHLORIDE: 107 mmol/L (ref 98–107)
CO2: 26 mmol/L (ref 20.0–31.0)
CREATININE: 1.28 mg/dL — ABNORMAL HIGH
EGFR CKD-EPI (2021) MALE: 63 mL/min/{1.73_m2} (ref >=60)
GLUCOSE RANDOM: 252 mg/dL — ABNORMAL HIGH (ref 70–179)
POTASSIUM: 3.6 mmol/L (ref 3.4–4.8)
PROTEIN TOTAL: 5.4 g/dL — ABNORMAL LOW (ref 5.7–8.2)
SODIUM: 138 mmol/L (ref 135–145)

## 2022-07-16 LAB — HEPATITIS PANEL, ACUTE
HEPATITIS A IGM ANTIBODY: NONREACTIVE
HEPATITIS B CORE IGM ANTIBODY: NONREACTIVE
HEPATITIS B SURFACE ANTIGEN: NONREACTIVE
HEPATITIS C ANTIBODY: NONREACTIVE

## 2022-07-16 LAB — PHOSPHORUS: PHOSPHORUS: 3.7 mg/dL (ref 2.4–5.1)

## 2022-07-16 LAB — EVEROLIMUS: EVEROLIMUS LEVEL: 3.4 ng/mL (ref 3.0–15.0)

## 2022-07-16 LAB — CBC
HEMATOCRIT: 31.5 % — ABNORMAL LOW (ref 39.0–48.0)
HEMOGLOBIN: 10.7 g/dL — ABNORMAL LOW (ref 12.9–16.5)
MEAN CORPUSCULAR HEMOGLOBIN CONC: 33.8 g/dL (ref 32.0–36.0)
MEAN CORPUSCULAR HEMOGLOBIN: 26.7 pg (ref 25.9–32.4)
MEAN CORPUSCULAR VOLUME: 78.9 fL (ref 77.6–95.7)
MEAN PLATELET VOLUME: 9.9 fL (ref 6.8–10.7)
PLATELET COUNT: 121 10*9/L — ABNORMAL LOW (ref 150–450)
RED BLOOD CELL COUNT: 4 10*12/L — ABNORMAL LOW (ref 4.26–5.60)
RED CELL DISTRIBUTION WIDTH: 13.2 % (ref 12.2–15.2)
WBC ADJUSTED: 5.1 10*9/L (ref 3.6–11.2)

## 2022-07-16 LAB — BILIRUBIN, DIRECT: BILIRUBIN DIRECT: 0.1 mg/dL (ref 0.00–0.30)

## 2022-07-16 LAB — GAMMA GT: GAMMA GLUTAMYL TRANSFERASE: 367 U/L — ABNORMAL HIGH

## 2022-07-16 LAB — TACROLIMUS LEVEL, TIMED: TACROLIMUS BLOOD: 4.2 ng/mL

## 2022-07-16 LAB — MAGNESIUM: MAGNESIUM: 1.6 mg/dL (ref 1.6–2.6)

## 2022-07-16 MED ADMIN — gabapentin (NEURONTIN) capsule 300 mg: 300 mg | ORAL | @ 01:00:00

## 2022-07-16 MED ADMIN — sodium chloride (NS) 0.9 % infusion: INTRAVENOUS | @ 18:00:00 | Stop: 2022-07-16

## 2022-07-16 MED ADMIN — heparin (porcine) 5,000 unit/mL injection 5,000 Units: 5000 [IU] | SUBCUTANEOUS | @ 20:00:00

## 2022-07-16 MED ADMIN — potassium chloride ER tablet 40 mEq: 40 meq | ORAL | @ 12:00:00 | Stop: 2022-07-16

## 2022-07-16 MED ADMIN — lidocaine (XYLOCAINE) 10 mg/mL (1 %) injection: INTRADERMAL | @ 19:00:00 | Stop: 2022-07-16

## 2022-07-16 MED ADMIN — sodium chloride (NS) 0.9 % infusion: 100 mL/h | INTRAVENOUS | @ 05:00:00

## 2022-07-16 MED ADMIN — amlodipine (NORVASC) tablet 10 mg: 10 mg | ORAL | @ 14:00:00

## 2022-07-16 MED ADMIN — DULoxetine (CYMBALTA) DR capsule 60 mg: 60 mg | ORAL | @ 14:00:00

## 2022-07-16 MED ADMIN — magnesium sulfate 2gm/50mL IVPB: 2 g | INTRAVENOUS | @ 14:00:00

## 2022-07-16 MED ADMIN — magnesium sulfate 2gm/50mL IVPB: 2 g | INTRAVENOUS | @ 12:00:00 | Stop: 2022-07-16

## 2022-07-16 MED ADMIN — tacrolimus (PROGRAF) capsule 2 mg: 2 mg | ORAL | @ 22:00:00

## 2022-07-16 MED ADMIN — midazolam (VERSED) injection: INTRAVENOUS | @ 19:00:00 | Stop: 2022-07-16

## 2022-07-16 MED ADMIN — sodium chloride 0.9% (NS) bolus 1,000 mL: 1000 mL | INTRAVENOUS | @ 14:00:00 | Stop: 2022-07-16

## 2022-07-16 MED ADMIN — insulin lispro (HumaLOG) injection 0-20 Units: 0-20 [IU] | SUBCUTANEOUS | @ 12:00:00

## 2022-07-16 MED ADMIN — insulin NPH (HumuLIN,NovoLIN) injection 6 Units: 6 [IU] | SUBCUTANEOUS | @ 14:00:00

## 2022-07-16 MED ADMIN — insulin NPH (HumuLIN,NovoLIN) injection 6 Units: 6 [IU] | SUBCUTANEOUS | @ 01:00:00

## 2022-07-16 MED ADMIN — insulin lispro (HumaLOG) injection 2 Units: 2 [IU] | SUBCUTANEOUS | @ 22:00:00

## 2022-07-16 MED ADMIN — methocarbamoL (ROBAXIN) tablet 500 mg: 500 mg | ORAL | @ 05:00:00

## 2022-07-16 MED ADMIN — fentaNYL (PF) (SUBLIMAZE) injection: INTRAVENOUS | @ 19:00:00 | Stop: 2022-07-16

## 2022-07-16 MED ADMIN — melatonin tablet 3 mg: 3 mg | ORAL | @ 22:00:00

## 2022-07-16 MED ADMIN — everolimus (immunosuppressive) (ZORTRESS) tablet 2 mg: 2 mg | ORAL | @ 14:00:00

## 2022-07-16 MED ADMIN — gabapentin (NEURONTIN) capsule 300 mg: 300 mg | ORAL | @ 20:00:00

## 2022-07-16 MED ADMIN — gabapentin (NEURONTIN) capsule 300 mg: 300 mg | ORAL | @ 14:00:00

## 2022-07-16 MED ADMIN — acetaminophen (TYLENOL) tablet 650 mg: 650 mg | ORAL | @ 05:00:00

## 2022-07-16 MED ADMIN — everolimus (immunosuppressive) (ZORTRESS) tablet 1 mg: 1 mg | ORAL | @ 22:00:00

## 2022-07-16 MED ADMIN — tacrolimus (PROGRAF) capsule 3 mg: 3 mg | ORAL | @ 12:00:00

## 2022-07-16 NOTE — Unmapped (Signed)
Pt is A&O x4 and verbalizes understanding of POC.    OOB independently this shift, no falls.    Tolerating regular diet, no complaints of n/v.  Pt has been NPO since midnight for liver biopsy today  Pain at right hip and leg from fall at-home last week; managed well with scheduled meds and PRN Robaxin.    VSS, pt remains afebrile.  No BM this shift  diminished UOP  No complaints or concerns.  Will continue to monitor.    Problem: Adult Inpatient Plan of Care  Goal: Plan of Care Review  Outcome: Ongoing - Unchanged  Goal: Patient-Specific Goal (Individualized)  Outcome: Ongoing - Unchanged  Goal: Absence of Hospital-Acquired Illness or Injury  Outcome: Ongoing - Unchanged  Intervention: Prevent and Manage VTE (Venous Thromboembolism) Risk  Recent Flowsheet Documentation  Taken 07/16/2022 0419 by Delray Reza, Milderd Meager, RN  Anti-Embolism Device Type: SCD, Knee  Anti-Embolism Intervention: Refused  Anti-Embolism Device Location: BLE  Taken 07/16/2022 0200 by Taria Castrillo, Milderd Meager, RN  Anti-Embolism Device Type: SCD, Knee  Anti-Embolism Intervention: Refused  Anti-Embolism Device Location: BLE  Taken 07/16/2022 0030 by Chelci Wintermute, Milderd Meager, RN  Anti-Embolism Device Type: SCD, Knee  Anti-Embolism Intervention: Refused  Anti-Embolism Device Location: BLE  Taken 07/15/2022 2200 by Ivanna Kocak, Milderd Meager, RN  Anti-Embolism Device Type: SCD, Knee  Anti-Embolism Intervention: Refused  Anti-Embolism Device Location: BLE  Taken 07/15/2022 2006 by Ciaira Natividad, Milderd Meager, RN  Anti-Embolism Device Type: SCD, Knee  Anti-Embolism Intervention: Refused  Anti-Embolism Device Location: BLE  Goal: Optimal Comfort and Wellbeing  Outcome: Ongoing - Unchanged  Goal: Readiness for Transition of Care  Outcome: Ongoing - Unchanged  Goal: Rounds/Family Conference  Outcome: Ongoing - Unchanged     Problem: Infection  Goal: Absence of Infection Signs and Symptoms  Outcome: Ongoing - Unchanged     Problem: Fall Injury Risk  Goal: Absence of Fall and Fall-Related Injury  Outcome: Ongoing - Unchanged

## 2022-07-16 NOTE — Unmapped (Signed)
Endocrine Team Diabetes Follow Up Consult Note     Consult information:  Requesting Attending Physician : Matthew Glen, MD  Service Requesting Consult : Surg Transplant [phone])  Primary Care Provider: Card, Matthew Picket, MD  Impression:  Matthew Key is a 61 y.o. male admitted for diarrhea. We have been consulted at the request of Matthew Lanney Gins, MD to evaluate Matthew Key for hyperglycemia.     Medical Decision Making:  Diagnoses:  1.Type 2 Diabetes. Uncontrolled With severe hyperglycemia last 24 hours.  2. Nutrition: Complicating glycemic control. Increasing risk for both hypoglycemia and hyperglycemia.  3. Transplant. Complicating glycemic control and increasing risk for hyperglycemia.  4.  Elevated transaminases. Complicating glycemic control and increasing risk for hyperglycemia and hypoglycemia  5. History of secondary adrenal insufficiency.       Studies reviewed 07/16/22:  Labs: CBC, BMP, POCT-BG  Interpretation: WBC normal. Electrolytes normal. Elevated transaminases. Hyperglycemia, some severe.    Notes reviewed: Primary team and nursing notes      Overall impression based on above reviews and history  Hyperglycemic, some severe after po intake. Currently NPO for liver biopsy, but will add nutritional dose for when eating again. Was due for ozempic Sunday.      Recommendations:  - NPH 6u q12h  - lispro 2u qAC  - lispro target 140, ISF 30 achs  - Hypoglycemia protocol.  - POCT-BG achs.  - Ensure patient is on glucose precautions if patient taking nutrition by mouth.     Discharge planning:  Will most likely discharge back on home regimen given good control and no hypoglycemia. Discharge med rec updated 12/1.    Thank you for this consult. Discussed plan with primary team. We will continue to follow and make recommendations and place orders as appropriate.    Please page with questions or concerns: Matthew Sankey, NP: [phone]  DCT on call from 6AM - 3PM on weekdays then endocrine fellow on call: [phone] from 3PM - 6AM on weekdays and on weekends and holidays.   If APP cannot be reached, please page the endocrine fellow on call.      Subjective:  Interval history: BG trending up after meals. States diarrhea has improved but feels like its due to being on CLD for colonoscopy. Did eat 2 meals yesterday. Currently NPO for liver biopsy. Denies complaints at time of rounds.      Initial HPI:  Matthew Key is a 61 y.o. male with a PMH of  has a past medical history of AKI (acute kidney injury) (CMS-HCC) (12/14/2020), Anxiety (10/22/2013), Arthritis, Cervical radiculopathy (12/03/2016), Chronic pain disorder, Cirrhosis (CMS-HCC), Dental abscess (10/2020), Duodenal ulcer (12/01/2017), GERD (gastroesophageal reflux disease), History of transfusion, Hyperlipidemia (10/22/2013), Hypertension, Hypotension (01/29/2022), Liver disease, Sleep apnea, obstructive, Stroke (CMS-HCC), and Type 2 diabetes mellitus with diabetic neuropathy, with long-term current use of insulin (CMS-HCC) (06/09/2014). admitted on 07/10/2022 for an after weekly post transplant labs revealed elevated transaminases and alk phos with persistent diarrhea for the last several weeks without improvement.     Diabetes History:  Patient has a history of Type 2 diabetes diagnosed 2017.  Diabetes is managed by: PCP.  Current home diabetes regimen: tresiba 13 units at bedtime, novolog correction and ozempic 1mg  qweek (Sundays, last dose 11/26).    Current home blood glucose monitoring:  3-4 times a day.  Hypoglycemia awareness: yes.  Complications related to diabetes: peripheral neuropathy      Endocrine History: History of secondary adrenal insufficiency. Had been receiving steroids  steroids for spinal injections. We saw back in January of this year and noted HPA axis recovery based on ACTH stim test (cortisol went from 8.4-> 22.9). Since then he was prescribed hydrocortisone 10mg  every day prn illness, but states he hasn't taken any. Has not taken any steroids this calendar year (2023).    Current Nutrition:  Active Orders   Diet    NPO Sips with meds; Procedure/Test       ROS: As per HPI.     amlodipine  10 mg Oral Daily    DULoxetine  60 mg Oral Daily    everolimus (immunosuppressive)  1 mg Oral Nightly    everolimus  2 mg Oral daily    gabapentin  300 mg Oral TID    insulin lispro  0-20 Units Subcutaneous ACHS    insulin NPH  6 Units Subcutaneous Q12H    magnesium sulfate  2 g Intravenous Daily    magnesium sulfate  2 g Intravenous Once    melatonin  3 mg Oral QPM    sodium chloride 0.9%  1,000 mL Intravenous Once    tacrolimus  2 mg Oral Nightly    tacrolimus  3 mg Oral Daily       Current Outpatient Medications   Medication Instructions    acetaminophen (TYLENOL) 325-650 mg, Oral, Every 6 hours PRN    amlodipine (NORVASC) 10 mg, Oral, Daily (standard)    aspirin (ECOTRIN) 81 mg, Oral, Daily (standard)    atorvastatin (LIPITOR) 40 mg, Oral, Daily (standard), Hold until directed to take by nurse coordinator    atovaquone (MEPRON) 1,500 mg, Oral, Daily (standard)    blood sugar diagnostic (ACCU-CHEK GUIDE TEST STRIPS) Strp Use to check blood sugar as directed with insulin 3 times a day & for symptoms of high or low blood sugar    blood-glucose meter kit Use as instructed    blood-glucose meter kit Use as instructed    blood-glucose meter kit Use as instructed    blood-glucose meter Misc Use to test blood sugar as directed    DULoxetine (CYMBALTA) 60 mg, Oral, Daily (standard)    everolimus, immunosuppressive, (ZORTRESS) 0.5 mg tablet Take 4 tablets (2 mg total) by mouth daily AND 2 tablets (1 mg total) nightly.    gabapentin (NEURONTIN) 300 mg, Oral, 3 times a day (standard)    glucagon spray 3 mg/actuation Spry Use 1 spray into the left nostril every fifteen (15) minutes as needed (low blood sugar).    hydrOXYzine (VISTARIL) 50 mg, Oral, Nightly PRN    insulin ASPART (NOVOLOG FLEXPEN) 100 unit/mL (3 mL) injection pen Inject under the skin per sliding scale prior to meals. If premeal BG 151-200 take 2 additional units for correction, if 201-250 take 4 additional units, etc. Max dose 36 units per day.  Store in-use prefilled pens at room temperature <86??F and use within 28 days; do not refrigerate.    lancets (ACCU-CHEK SOFTCLIX LANCETS) Misc Use to check blood sugar as directed with insulin 3 times a day & for symptoms of high or low blood sugar.    magnesium oxide-Mg AA chelate (MAGNESIUM, AMINO ACID CHELATE,) 133 mg 2 tablets, Oral, 2 times a day    melatonin 3 mg, Oral, Every evening    methocarbamoL (ROBAXIN) 500 mg, Oral, 3 times a day PRN    OZEMPIC 0.5 mg, Subcutaneous, Every 7 days    pantoprazole (PROTONIX) 40 mg, Oral, Daily PRN    patiromer calcium sorbitex (VELTASSA) 16.8 g, Oral,  2 times a day    pen needle, diabetic (BD ULTRA-FINE NANO PEN NEEDLE) 32 gauge x 5/32 (4 mm) Ndle Use as directed  1-2 times per day    polyethylene glycol (MIRALAX) 17 gram/dose powder Mix 17 g (use measure line in cap) in 4-8 ounces of water, juice, soda, coffee, or tea and take by mouth daily as needed.    tacrolimus (PROGRAF) 1 MG capsule Take 3 capsules (3 mg total) by mouth daily AND 2 capsules (2 mg total) nightly.    TRESIBA FLEXTOUCH U-100 13 Units, Subcutaneous, Daily (standard), Adjust as instructed.    VELTASSA 16.8 g, Oral, Daily (standard)           Past Medical History:   Diagnosis Date    AKI (acute kidney injury) (CMS-HCC) 12/14/2020    Anxiety 10/22/2013    Arthritis     Cervical radiculopathy 12/03/2016    Chronic pain disorder     Lower back    Cirrhosis (CMS-HCC)     Dental abscess 10/2020    Duodenal ulcer 12/01/2017    GERD (gastroesophageal reflux disease)     History of transfusion     Hyperlipidemia 10/22/2013    Hypertension     under control with meds and weight loss    Hypotension 01/29/2022    Liver disease     Sleep apnea, obstructive     Have machine    Stroke (CMS-HCC)     mild stroke    Type 2 diabetes mellitus with diabetic neuropathy, with long-term current use of insulin (CMS-HCC) 06/09/2014       Past Surgical History:   Procedure Laterality Date    KNEE SURGERY      PR CATH PLACE/CORON ANGIO, IMG SUPER/INTERP,R&L HRT CATH, L HRT VENTRIC N/A 12/19/2020    Procedure: CATH LEFT/RIGHT HEART CATHETERIZATION;  Surgeon: Rosana Hoes, MD;  Location: [facility] CATH;  Service: Cardiology    PR COLONOSCOPY FLX DX W/COLLJ SPEC WHEN PFRMD N/A 12/07/2021    Procedure: COLONOSCOPY, FLEXIBLE, PROXIMAL TO SPLENIC FLEXURE; DIAGNOSTIC, W/WO COLLECTION SPECIMEN BY BRUSH OR WASH;  Surgeon: Annie Paras, MD;  Location: GI PROCEDURES [facility];  Service: Gastroenterology    PR TRANSPLANT LIVER,ALLOTRANSPLANT N/A 02/06/2022    Procedure: LIVER ALLOTRANSPLANTATION; ORTHOTOPIC, PARTIAL OR WHOLE, FROM CADAVER OR LIVING DONOR, ANY AGE;  Surgeon: Matthew Glen, MD;  Location: MAIN OR [place];  Service: Transplant    PR TRANSPLANT,PREP DONOR LIVER/ARTERIAL N/A 02/06/2022    Procedure: BACKBNCH RECONSTRUCT OF CAD/LIVE DONOR LIVER GFT PRIOR TRANSPLANT; ARTERIAL ANASTAMOSIS, EA;  Surgeon: Matthew Glen, MD;  Location: MAIN OR [place];  Service: Transplant    PR UPPER GI ENDOSCOPY,BIOPSY N/A 10/12/2020    Procedure: UGI ENDOSCOPY; WITH BIOPSY, SINGLE OR MULTIPLE;  Surgeon: Marene Lenz, MD;  [facility]: [facility];  Service: Gastroenterology    PR UPPER GI ENDOSCOPY,DIAGNOSIS N/A 01/03/2022    Procedure: UGI ENDO, INCLUDE ESOPHAGUS, STOMACH, & DUODENUM &/OR JEJUNUM; DX W/WO COLLECTION SPECIMN, BY BRUSH OR WASH;  Surgeon: Marene Lenz, MD;  Location: GI PROC[facility]URES [facility];  Service: Gastroenterology    PR UPPER GI ENDOSCOPY,LIGAT VARIX N/A 12/07/2021    Procedure: UGI ENDO; Everlene Balls LIG ESOPH &/OR GASTRIC VARICES;  Surgeon: Annie Paras, MD;  Location: GI [facility];  Service: Gastroenterology    ROOT CANAL      Front teeth       Family History   Problem Relation Age of Onset    Edema  Mother     Alzheimer's disease Father     Aortic dissection Brother     Early death Brother     Aneurysm Brother        Social History     Tobacco Use    Smoking status: Never    Smokeless tobacco: Never   Vaping Use    Vaping Use: Never used   Substance Use Topics    Alcohol use: Not Currently    Drug use: Not Currently       OBJECTIVE:  BP 137/69  - Pulse 65  - Temp 36.6 ??C (97.9 ??F) (Oral)  - Resp 19  - Ht 172.7 cm (5' 8)  - Wt 85.7 kg (189 lb)  - SpO2 94%  - BMI 28.74 kg/m??   Wt Readings from Last 12 Encounters:   07/15/22 85.7 kg (189 lb)   05/06/22 87.5 kg (193 lb)   04/08/22 86.7 kg (191 lb 1.6 oz)   04/08/22 86.7 kg (191 lb 1.6 oz)   03/11/22 86 kg (189 lb 11.2 oz)   02/25/22 88.6 kg (195 lb 4.8 oz)   02/25/22 88.6 kg (195 lb 4.8 oz)   02/18/22 96.3 kg (212 lb 3.2 oz)   02/11/22 94.8 kg (209 lb)   01/29/22 96.9 kg (213 lb 11.2 oz)   01/14/22 89.8 kg (198 lb)   01/03/22 88.9 kg (196 lb)     Physical Exam  Constitutional:       Appearance: Normal appearance.   Pulmonary:      Effort: Pulmonary effort is normal. No respiratory distress.   Skin:     General: Skin is warm and dry.   Neurological:      General: No focal deficit present.      Mental Status: He is alert and oriented to person, place, and time.             BG/insulin reviewed per EMR.   Glucose, POC (mg/dL)   Date Value   96/11/5407 198 (H)   07/15/2022 103   07/15/2022 227 (H)   07/15/2022 87   07/15/2022 126   07/14/2022 104   07/14/2022 178   07/14/2022 99        Summary of labs:  Lab Results   Component Value Date    A1C 6.5 (H) 05/06/2022    A1C 5.2 02/05/2022    A1C 4.7 (L) 01/14/2022     Lab Results   Component Value Date    CREATININE 1.28 (H) 07/16/2022     Lab Results   Component Value Date    WBC 5.1 07/16/2022    HGB 10.7 (L) 07/16/2022    HCT 31.5 (L) 07/16/2022    PLT 121 (L) 07/16/2022       Lab Results   Component Value Date    NA 138 07/16/2022    K 3.6 07/16/2022    CL 107 07/16/2022    CO2 26.0 07/16/2022    BUN 11 07/16/2022    CREATININE 1.28 (H) 07/16/2022    GLU 252 (H) 07/16/2022    CALCIUM 8.6 (L) 07/16/2022    MG 1.6 07/16/2022    PHOS 3.7 07/16/2022       Lab Results   Component Value Date    BILITOT 0.3 07/16/2022    BILIDIR 0.10 07/16/2022    PROT 5.4 (L) 07/16/2022    ALBUMIN 2.9 (L) 07/16/2022    ALT 190 (H) 07/16/2022    AST 42 (H) 07/16/2022  ALKPHOS 313 (H) 07/16/2022    GGT 367 (H) 07/16/2022

## 2022-07-16 NOTE — Unmapped (Signed)
HEPATOLOGY Treatment Update    Matthew Key is a 61 y.o. 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    Jenita Seashore, MD/PhD  [facility] Gastroenterology and Hepatology Fellow    - The GI consult service will no longer be actively following this patient at this time. We remain available for any questions that arise during the patients hospitalization. Please contact us with questions or concerns. Please reference the below guidance.

## 2022-07-16 NOTE — Unmapped (Cosign Needed)
[place] INTERVENTIONAL RADIOLOGY - Operative Note     VIR Post-Procedure Note    Procedure Name: non-targeted transplant liver biopsy    Pre-Op Diagnosis: concern for rejection    Post-Op Diagnosis: Same as pre-operative diagnosis    VIR Providers  Attending: Shaaron Adler, MD  Operator: Everrett Coombe, MD    Time out: Prior to the procedure, a time out was performed with all team members present. During the time out, the patient, procedure and procedure site when applicable were verbally verified by the team members and Drs. Duanne Limerick and Jordi Lacko.    Description of procedure: Successful non-targeted liver biopsy. 2 cores obtained. Gelfoam slurry.    Sedation:Moderate sedation    Estimated Blood Loss: approximately <5 mL  Complications: None    See detailed procedure note with images in PACS.    The patient tolerated the procedure well without incident or complication and left the room in stable condition.    Everrett Coombe, MD  07/16/2022 2:08 PM

## 2022-07-16 NOTE — Unmapped (Signed)
Transplant Surgery Progress Note    Hospital Day: 7    Assessment:     Matthew Key is a 61 y.o. 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 Celine Mans. He was admitted on 07/10/2022 after weekly post transplant labs revealed elevated transaminases and alk phos with persistent diarrhea for the last several weeks without improvement. Infectious workup negative.     Interval Events:     No acute events overnight. Pain is well controlled.  Vital signs are stable. Urine output is appropriate.  Patient having bowel movements. Went with GI for colonoscopy yesterday. No pertinent findings on scope. Will continue to follow biopsies. Bump in creatinine to 1.28 from 0.95 this AM. Planning for VIR liver biopsy this AM. W    Plan:     Neuro:  - Pain well controlled  - Home gabapentin, duloxetine, APAP PRN, robaxin PRN    CV:  - HDS, Maintain SBP < 180  - Home amlodipine  - holding home aspirin    Pulm:  - Stable on room air    GI:  - F: NS @ 170mL/hr   - 1L NS bolus this AM  - E: Replete as needed  - N: NPO for VIR biopsy   - resume regular diet after biopsy  - pantoprazole  - IV mag 2g daily  - holing lomotil - pt concerned it is caused for elevated LFTs  - will start metamucil after VIR  - GI consulted; appreciate recs   -s/p colonoscopy 07/15/22   - follow up biopsies  - elevated LFTs   - consult VIR for percutaneous liver biopsy    GU:  - Voiding spontaneously    Endo:  - endocrine consulted, appreciate recs  - NPH 6U q12h  - lispro target 140, ISF 30 achs  - hypoglycemia protocol  - POCT-BG achs     Heme/ID:  - Afebrile, WBC and Hgb stable  - ppx with pentamidine (hold atovaquone)  - C. Diff, GI Path Panel, CMV, O&P negative  - holding dvt ppx for procedures    MSK:  - PT consult for R hip pain: recommend outpatient PT prn    Immuno:  - tac, everolimus  - pharmacy recommendations for dosing      Dispo  - Floor      Objective:        Vital Signs:  BP 137/69  - Pulse 65 - Temp 36.6 ??C (97.9 ??F) (Oral)  - Resp 19  - Ht 172.7 cm (5' 8)  - Wt 85.7 kg (189 lb)  - SpO2 94%  - BMI 28.74 kg/m??     Input/Output:  I/O last 3 completed shifts:  In: 441.7 [P.O.:50; I.V.:391.7]  Out: 600 [Urine:600]    Physical Exam:    General: Cooperative, no distress, well appearing male, lying comfortably in bed  Pulmonary: Normal work of breathing, on room air  Cardiovascular: Regular rate  Abdomen: Soft, non-tender, non-distended. Surgical incision well-healed.   Musculoskeletal: Moves extremities spontaneously   Neurologic: Alert and interactive, grossly intact    Labs:  Lab Results   Component Value Date    WBC 5.1 07/16/2022    HGB 10.7 (L) 07/16/2022    HCT 31.5 (L) 07/16/2022    PLT 121 (L) 07/16/2022       Lab Results   Component Value Date    NA 138 07/16/2022    K 3.6 07/16/2022  CL 107 07/16/2022    CO2 26.0 07/16/2022    BUN 11 07/16/2022    CREATININE 1.28 (H) 07/16/2022    CALCIUM 8.6 (L) 07/16/2022    MG 1.6 07/16/2022    PHOS 3.7 07/16/2022       Microbiology Results (last day)       Procedure Component Value Date/Time Date/Time    C. Difficile Assay [[id number]]  (Normal) Collected: 07/11/22 0603    Lab Status: Final result Specimen: Stool  Updated: 07/11/22 0843     C. Diff Result Negative     Comment: Clostridium difficile NOT detected       Narrative:      The methodology of this test detects C. difficile toxin A and/or toxin B, by EIA.        GI Pathogen Panel [[id number]] Collected: 07/11/22 0603    Lab Status: In process Specimen: Stool  Updated: 07/11/22 0640    Comprehensive Ova and Parasite Exam [[id number]] Collected: 07/11/22 0605    Lab Status: In process Specimen: Stool  Updated: 07/11/22 0640            Imaging:  All pertinent imaging personally reviewed.    Montez Hageman, MD  PGY-1, General Surgery  [facility]

## 2022-07-17 DIAGNOSIS — Z944 Liver transplant status: Principal | ICD-10-CM

## 2022-07-17 DIAGNOSIS — Z5181 Encounter for therapeutic drug level monitoring: Principal | ICD-10-CM

## 2022-07-17 DIAGNOSIS — E612 Magnesium deficiency: Principal | ICD-10-CM

## 2022-07-17 LAB — CBC
HEMATOCRIT: 31 % — ABNORMAL LOW (ref 39.0–48.0)
HEMOGLOBIN: 10.7 g/dL — ABNORMAL LOW (ref 12.9–16.5)
MEAN CORPUSCULAR HEMOGLOBIN CONC: 34.4 g/dL (ref 32.0–36.0)
MEAN CORPUSCULAR HEMOGLOBIN: 27 pg (ref 25.9–32.4)
MEAN CORPUSCULAR VOLUME: 78.6 fL (ref 77.6–95.7)
MEAN PLATELET VOLUME: 9.2 fL (ref 6.8–10.7)
PLATELET COUNT: 118 10*9/L — ABNORMAL LOW (ref 150–450)
RED BLOOD CELL COUNT: 3.94 10*12/L — ABNORMAL LOW (ref 4.26–5.60)
RED CELL DISTRIBUTION WIDTH: 12.9 % (ref 12.2–15.2)
WBC ADJUSTED: 5.6 10*9/L (ref 3.6–11.2)

## 2022-07-17 LAB — COMPREHENSIVE METABOLIC PANEL
ALBUMIN: 2.8 g/dL — ABNORMAL LOW (ref 3.4–5.0)
ALKALINE PHOSPHATASE: 261 U/L — ABNORMAL HIGH (ref 46–116)
ALT (SGPT): 134 U/L — ABNORMAL HIGH (ref 10–49)
ANION GAP: 7 mmol/L (ref 5–14)
AST (SGOT): 28 U/L (ref <=34)
BILIRUBIN TOTAL: 0.3 mg/dL (ref 0.3–1.2)
BLOOD UREA NITROGEN: 8 mg/dL — ABNORMAL LOW (ref 9–23)
BUN / CREAT RATIO: 8
CALCIUM: 8.8 mg/dL (ref 8.7–10.4)
CHLORIDE: 108 mmol/L — ABNORMAL HIGH (ref 98–107)
CO2: 27 mmol/L (ref 20.0–31.0)
CREATININE: 0.97 mg/dL
EGFR CKD-EPI (2021) MALE: 88 mL/min/{1.73_m2} (ref >=60)
GLUCOSE RANDOM: 85 mg/dL (ref 70–179)
POTASSIUM: 3.9 mmol/L (ref 3.4–4.8)
PROTEIN TOTAL: 5.3 g/dL — ABNORMAL LOW (ref 5.7–8.2)
SODIUM: 142 mmol/L (ref 135–145)

## 2022-07-17 LAB — TISSUE TRANSGLUTAMINASE (TTG), IGA
TISSUE TRANSGLUTAMINASE ANTIBODY, IGA: <0.2 U/mL (ref <7)
TTG INTERPRETATION: NEGATIVE

## 2022-07-17 LAB — BILIRUBIN, DIRECT: BILIRUBIN DIRECT: 0.1 mg/dL (ref 0.00–0.30)

## 2022-07-17 LAB — PHOSPHORUS: PHOSPHORUS: 3.9 mg/dL (ref 2.4–5.1)

## 2022-07-17 LAB — MAGNESIUM: MAGNESIUM: 1.8 mg/dL (ref 1.6–2.6)

## 2022-07-17 LAB — GAMMA GT: GAMMA GLUTAMYL TRANSFERASE: 314 U/L — ABNORMAL HIGH

## 2022-07-17 MED ORDER — PSYLLIUM HUSK (ASPARTAME) 3.4 GRAM ORAL POWDER PACKET
Freq: Two times a day (BID) | ORAL | 11 refills | 30 days | Status: CP
Start: 2022-07-17 — End: ?

## 2022-07-17 MED ADMIN — insulin lispro (HumaLOG) injection 2 Units: 2 [IU] | SUBCUTANEOUS

## 2022-07-17 MED ADMIN — heparin (porcine) 5,000 unit/mL injection 5,000 Units: 5000 [IU] | SUBCUTANEOUS | @ 18:00:00

## 2022-07-17 MED ADMIN — heparin (porcine) 5,000 unit/mL injection 5,000 Units: 5000 [IU] | SUBCUTANEOUS | @ 11:00:00

## 2022-07-17 MED ADMIN — magnesium sulfate 2gm/50mL IVPB: 2 g | INTRAVENOUS | @ 14:00:00

## 2022-07-17 MED ADMIN — heparin (porcine) 5,000 unit/mL injection 5,000 Units: 5000 [IU] | SUBCUTANEOUS | @ 02:00:00

## 2022-07-17 MED ADMIN — DULoxetine (CYMBALTA) DR capsule 60 mg: 60 mg | ORAL | @ 14:00:00

## 2022-07-17 MED ADMIN — insulin lispro (HumaLOG) injection 0-20 Units: 0-20 [IU] | SUBCUTANEOUS | @ 02:00:00

## 2022-07-17 MED ADMIN — tacrolimus (PROGRAF) capsule 2 mg: 2 mg | ORAL

## 2022-07-17 MED ADMIN — psyllium (METAMUCIL) 3.4 gram packet 1 packet: 1 | ORAL | @ 18:00:00

## 2022-07-17 MED ADMIN — insulin lispro (HumaLOG) injection 2 Units: 2 [IU] | SUBCUTANEOUS | @ 18:00:00

## 2022-07-17 MED ADMIN — gabapentin (NEURONTIN) capsule 300 mg: 300 mg | ORAL | @ 02:00:00

## 2022-07-17 MED ADMIN — sodium chloride (NS) 0.9 % infusion: 100 mL/h | INTRAVENOUS | @ 04:00:00

## 2022-07-17 MED ADMIN — gabapentin (NEURONTIN) capsule 300 mg: 300 mg | ORAL | @ 18:00:00

## 2022-07-17 MED ADMIN — everolimus (immunosuppressive) (ZORTRESS) tablet 2 mg: 2 mg | ORAL | @ 11:00:00

## 2022-07-17 MED ADMIN — insulin lispro (HumaLOG) injection 2 Units: 2 [IU] | SUBCUTANEOUS | @ 14:00:00

## 2022-07-17 MED ADMIN — insulin NPH (HumuLIN,NovoLIN) injection 6 Units: 6 [IU] | SUBCUTANEOUS | @ 14:00:00

## 2022-07-17 MED ADMIN — gabapentin (NEURONTIN) capsule 300 mg: 300 mg | ORAL | @ 14:00:00

## 2022-07-17 MED ADMIN — insulin NPH (HumuLIN,NovoLIN) injection 6 Units: 6 [IU] | SUBCUTANEOUS | @ 02:00:00

## 2022-07-17 MED ADMIN — tacrolimus (PROGRAF) capsule 3 mg: 3 mg | ORAL | @ 11:00:00

## 2022-07-17 MED ADMIN — amlodipine (NORVASC) tablet 10 mg: 10 mg | ORAL | @ 14:00:00

## 2022-07-17 MED ADMIN — psyllium (METAMUCIL) 3.4 gram packet 1 packet: 1 | ORAL | @ 14:00:00

## 2022-07-17 MED ADMIN — everolimus (immunosuppressive) (ZORTRESS) tablet 1 mg: 1 mg | ORAL

## 2022-07-17 NOTE — Unmapped (Signed)
Endocrine Team Diabetes Follow Up Consult Note     Consult information:  Requesting Attending Physician : Florene Glen, MD  Service Requesting Consult : Surg Transplant [phone])  Primary Care Provider: Card, [name], MD  Impression:  Matthew Key is a 61 y.o. male admitted for diarrhea. We have been consulted at the request of Chirag Lanney Gins, MD to evaluate Matthew Key for hyperglycemia.     Medical Decision Making:  Diagnoses:  1.Type 2 Diabetes. Uncontrolled With severe hyperglycemia last 24 hours.  2. Nutrition: Complicating glycemic control. Increasing risk for both hypoglycemia and hyperglycemia.  3. Transplant. Complicating glycemic control and increasing risk for hyperglycemia.  4.  Elevated transaminases. Complicating glycemic control and increasing risk for hyperglycemia and hypoglycemia  5. History of secondary adrenal insufficiency.       Studies reviewed 07/17/22:  Labs: CBC, BMP, POCT-BG  Interpretation: WBC normal. Electrolytes normal. Elevated transaminases. Hyperglycemia, some severe.    Notes reviewed: Primary team and nursing notes      Overall impression based on above reviews and history  Hyperglycemic, some severe after po intake. Will hold off on increasing doses given basal was reduced yesterday morning while NPO. Has full dose on board today.     Recommendations:  - NPH 6u q12h  - lispro 2u qAC  - lispro target 140, ISF 30 achs  - Hypoglycemia protocol.  - POCT-BG achs.  - Ensure patient is on glucose precautions if patient taking nutrition by mouth.     Discharge planning:  Will most likely discharge back on home regimen given good control and no hypoglycemia. Discharge med rec updated 12/1.    Thank you for this consult. Discussed plan with primary team. We will continue to follow and make recommendations and place orders as appropriate.    Please page with questions or concerns: Evin Loiseau, NP: [phone]  DCT on call from 6AM - 3PM on weekdays then endocrine fellow on call: [phone] from 3PM - 6AM on weekdays and on weekends and holidays.   If APP cannot be reached, please page the endocrine fellow on call.      Subjective:  Interval history: NAEO. States diarrhea has improved. Denies complaints at time of rounds.      Initial HPI:  Matthew Key is a 61 y.o. male with a PMH of  has a past medical history of AKI (acute kidney injury) (CMS-HCC) (12/14/2020), Anxiety (10/22/2013), Arthritis, Cervical radiculopathy (12/03/2016), Chronic pain disorder, Cirrhosis (CMS-HCC), Dental abscess (10/2020), Duodenal ulcer (12/01/2017), GERD (gastroesophageal reflux disease), History of transfusion, Hyperlipidemia (10/22/2013), Hypertension, Hypotension (01/29/2022), Liver disease, Sleep apnea, obstructive, Stroke (CMS-HCC), and Type 2 diabetes mellitus with diabetic neuropathy, with long-term current use of insulin (CMS-HCC) (06/09/2014). admitted on 07/10/2022 for an after weekly post transplant labs revealed elevated transaminases and alk phos with persistent diarrhea for the last several weeks without improvement.     Diabetes History:  Patient has a history of Type 2 diabetes diagnosed 2017.  Diabetes is managed by: PCP.  Current home diabetes regimen: tresiba 13 units at bedtime, novolog correction and ozempic 1mg  qweek (Sundays, last dose 11/26).    Current home blood glucose monitoring:  3-4 times a day.  Hypoglycemia awareness: yes.  Complications related to diabetes: peripheral neuropathy      Endocrine History: History of secondary adrenal insufficiency. Had been receiving steroids steroids for spinal injections. We saw back in January of this year and noted HPA axis recovery based on ACTH stim test (cortisol went from  8.4-> 22.9). Since then he was prescribed hydrocortisone 10mg  every day prn illness, but states he hasn't taken any. Has not taken any steroids this calendar year (2023).    Current Nutrition:  Active Orders   Diet    Nutrition Therapy Regular/House       ROS: As per HPI.     amlodipine  10 mg Oral Daily    DULoxetine  60 mg Oral Daily    everolimus (immunosuppressive)  1 mg Oral Nightly    everolimus  2 mg Oral daily    gabapentin  300 mg Oral TID    heparin (porcine) for subcutaneous use  5,000 Units Subcutaneous Q8H SCH    insulin lispro  0-20 Units Subcutaneous ACHS    insulin lispro  2 Units Subcutaneous 3xd Meals    insulin NPH  6 Units Subcutaneous Q12H    magnesium sulfate  2 g Intravenous Daily    melatonin  3 mg Oral QPM    psyllium  1 packet Oral TID    tacrolimus  2 mg Oral Nightly    tacrolimus  3 mg Oral Daily       Current Outpatient Medications   Medication Instructions    acetaminophen (TYLENOL) 325-650 mg, Oral, Every 6 hours PRN    amlodipine (NORVASC) 10 mg, Oral, Daily (standard)    aspirin (ECOTRIN) 81 mg, Oral, Daily (standard)    atorvastatin (LIPITOR) 40 mg, Oral, Daily (standard), Hold until directed to take by nurse coordinator    atovaquone (MEPRON) 1,500 mg, Oral, Daily (standard)    blood sugar diagnostic (ACCU-CHEK GUIDE TEST STRIPS) Strp Use to check blood sugar as directed with insulin 3 times a day & for symptoms of high or low blood sugar    blood-glucose meter kit Use as instructed    blood-glucose meter kit Use as instructed    blood-glucose meter kit Use as instructed    blood-glucose meter Misc Use to test blood sugar as directed    DULoxetine (CYMBALTA) 60 mg, Oral, Daily (standard)    everolimus, immunosuppressive, (ZORTRESS) 0.5 mg tablet Take 4 tablets (2 mg total) by mouth daily AND 2 tablets (1 mg total) nightly.    gabapentin (NEURONTIN) 300 mg, Oral, 3 times a day (standard)    glucagon spray 3 mg/actuation Spry Use 1 spray into the left nostril every fifteen (15) minutes as needed (low blood sugar).    hydrOXYzine (VISTARIL) 50 mg, Oral, Nightly PRN    insulin ASPART (NOVOLOG FLEXPEN) 100 unit/mL (3 mL) injection pen Inject under the skin per sliding scale prior to meals. If premeal BG 151-200 take 2 additional units for correction, if 201-250 take 4 additional units, etc. Max dose 36 units per day.  Store in-use prefilled pens at room temperature <86??F and use within 28 days; do not refrigerate.    lancets (ACCU-CHEK SOFTCLIX LANCETS) Misc Use to check blood sugar as directed with insulin 3 times a day & for symptoms of high or low blood sugar.    magnesium oxide-Mg AA chelate (MAGNESIUM, AMINO ACID CHELATE,) 133 mg 2 tablets, Oral, 2 times a day    melatonin 3 mg, Oral, Every evening    methocarbamoL (ROBAXIN) 500 mg, Oral, 3 times a day PRN    OZEMPIC 0.5 mg, Subcutaneous, Every 7 days    pantoprazole (PROTONIX) 40 mg, Oral, Daily PRN    patiromer calcium sorbitex (VELTASSA) 16.8 g, Oral, 2 times a day    pen needle, diabetic (BD ULTRA-FINE NANO PEN  NEEDLE) 32 gauge x 5/32 (4 mm) Ndle Use as directed  1-2 times per day    polyethylene glycol (MIRALAX) 17 gram/dose powder Mix 17 g (use measure line in cap) in 4-8 ounces of water, juice, soda, coffee, or tea and take by mouth daily as needed.    tacrolimus (PROGRAF) 1 MG capsule Take 3 capsules (3 mg total) by mouth daily AND 2 capsules (2 mg total) nightly.    TRESIBA FLEXTOUCH U-100 13 Units, Subcutaneous, Daily (standard), Adjust as instructed.    VELTASSA 16.8 g, Oral, Daily (standard)           Past Medical History:   Diagnosis Date    AKI (acute kidney injury) (CMS-HCC) 12/14/2020    Anxiety 10/22/2013    Arthritis     Cervical radiculopathy 12/03/2016    Chronic pain disorder     Lower back    Cirrhosis (CMS-HCC)     Dental abscess 10/2020    Duodenal ulcer 12/01/2017    GERD (gastroesophageal reflux disease)     History of transfusion     Hyperlipidemia 10/22/2013    Hypertension     under control with meds and weight loss    Hypotension 01/29/2022    Liver disease     Sleep apnea, obstructive     Have machine    Stroke (CMS-HCC)     mild stroke    Type 2 diabetes mellitus with diabetic neuropathy, with long-term current use of insulin (CMS-HCC) 06/09/2014       Past Surgical History:   Procedure Laterality Date    KNEE SURGERY      PR CATH PLACE/CORON ANGIO, IMG SUPER/INTERP,R&L HRT CATH, L HRT VENTRIC N/A 12/19/2020    Procedure: CATH LEFT/RIGHT HEART CATHETERIZATION;  Surgeon: Rosana Hoes, MD;  Location: [facility] CATH;  Service: Cardiology    PR COLONOSCOPY FLX DX W/COLLJ SPEC WHEN PFRMD N/A 12/07/2021    Procedure: COLONOSCOPY, FLEXIBLE, PROXIMAL TO SPLENIC FLEXURE; DIAGNOSTIC, W/WO COLLECTION SPECIMEN BY BRUSH OR WASH;  Surgeon: Annie Paras, MD;  [facility]: [facility];  Service: Gastroenterology    PR COLONOSCOPY W/BIOPSY SINGLE/MULTIPLE N/A 07/15/2022    Procedure: COLONOSCOPY, FLEXIBLE, PROXIMAL TO SPLENIC FLEXURE; WITH BIOPSY, SINGLE OR MULTIPLE;  Surgeon: Leland Her, MD;  [facility]: [facility];  Service: Gastroenterology    PR TRANSPLANT LIVER,ALLOTRANSPLANT N/A 02/06/2022    Procedure: LIVER ALLOTRANSPLANTATION; ORTHOTOPIC, PARTIAL OR WHOLE, FROM CADAVER OR LIVING DONOR, ANY AGE;  Surgeon: Florene Glen, MD;  Location: MAIN OR [place];  Service: Transplant    PR TRANSPLANT,PREP DONOR LIVER/ARTERIAL N/A 02/06/2022    Procedure: BACKBNCH RECONSTRUCT OF CAD/LIVE DONOR LIVER GFT PRIOR TRANSPLANT; ARTERIAL ANASTAMOSIS, EA;  Surgeon: Florene Glen, MD;  Location: MAIN OR [place];  Service: Transplant    PR UPPER GI ENDOSCOPY,BIOPSY N/A 10/12/2020    Procedure: UGI ENDOSCOPY; WITH BIOPSY, SINGLE OR MULTIPLE;  Surgeon: Marene Lenz, MD;  Location: [facility];  Service: Gastroenterology    PR UPPER GI ENDOSCOPY,DIAGNOSIS N/A 01/03/2022    Procedure: UGI ENDO, INCLUDE ESOPHAGUS, STOMACH, & DUODENUM &/OR JEJUNUM; DX W/WO COLLECTION SPECIMN, BY BRUSH OR WASH;  Surgeon: Marene Lenz, MD;  [facility]: [facility];  Service: Gastroenterology    PR UPPER GI ENDOSCOPY,LIGAT VARIX N/A 12/07/2021    Procedure: UGI ENDO; Everlene Balls LIG ESOPH &/OR GASTRIC VARICES;  Surgeon: Annie Paras, MD;  Location: [facility];  Service: Gastroenterology    ROOT CANAL  Front teeth       Family History   Problem Relation Age of Onset    Edema Mother     Alzheimer's disease Father     Aortic dissection Brother     Early death Brother     Aneurysm Brother        Social History     Tobacco Use    Smoking status: Never    Smokeless tobacco: Never   Vaping Use    Vaping Use: Never used   Substance Use Topics    Alcohol use: Not Currently    Drug use: Not Currently       OBJECTIVE:  BP 112/72  - Pulse 60  - Temp 36.6 ??C (97.9 ??F) (Oral)  - Resp 18  - Ht 172.7 cm (5' 8)  - Wt 86.3 kg (190 lb 3.2 oz)  - SpO2 96%  - BMI 28.92 kg/m??   Wt Readings from Last 12 Encounters:   07/16/22 86.3 kg (190 lb 3.2 oz)   05/06/22 87.5 kg (193 lb)   04/08/22 86.7 kg (191 lb 1.6 oz)   04/08/22 86.7 kg (191 lb 1.6 oz)   03/11/22 86 kg (189 lb 11.2 oz)   02/25/22 88.6 kg (195 lb 4.8 oz)   02/25/22 88.6 kg (195 lb 4.8 oz)   02/18/22 96.3 kg (212 lb 3.2 oz)   02/11/22 94.8 kg (209 lb)   01/29/22 96.9 kg (213 lb 11.2 oz)   01/14/22 89.8 kg (198 lb)   01/03/22 88.9 kg (196 lb)     Physical Exam  Constitutional:       Appearance: Normal appearance.   Pulmonary:      Effort: Pulmonary effort is normal. No respiratory distress.   Skin:     General: Skin is warm and dry.   Neurological:      General: No focal deficit present.      Mental Status: He is alert and oriented to person, place, and time.             BG/insulin reviewed per EMR.   Glucose, POC (mg/dL)   Date Value   16/05/9603 93   07/16/2022 208 (H)   07/16/2022 108   07/16/2022 78   07/16/2022 198 (H)   07/15/2022 103   07/15/2022 227 (H)   07/15/2022 87        Summary of labs:  Lab Results   Component Value Date    A1C 6.5 (H) 05/06/2022    A1C 5.2 02/05/2022    A1C 4.7 (L) 01/14/2022     Lab Results   Component Value Date    CREATININE 0.97 07/17/2022     Lab Results   Component Value Date    WBC 5.6 07/17/2022    HGB 10.7 (L) 07/17/2022    HCT 31.0 (L) 07/17/2022 PLT 118 (L) 07/17/2022       Lab Results   Component Value Date    NA 142 07/17/2022    K 3.9 07/17/2022    CL 108 (H) 07/17/2022    CO2 27.0 07/17/2022    BUN 8 (L) 07/17/2022    CREATININE 0.97 07/17/2022    GLU 85 07/17/2022    CALCIUM 8.8 07/17/2022    MG 1.8 07/17/2022    PHOS 3.9 07/17/2022       Lab Results   Component Value Date    BILITOT 0.3 07/17/2022    BILIDIR 0.10 07/17/2022    PROT 5.3 (L) 07/17/2022  ALBUMIN 2.8 (L) 07/17/2022    ALT 134 (H) 07/17/2022    AST 28 07/17/2022    ALKPHOS 261 (H) 07/17/2022    GGT 314 (H) 07/17/2022

## 2022-07-17 NOTE — Unmapped (Signed)
Reviewed plan of care with patient who indicates understanding. Pt has no questions or concerns at this time. Will continue to monitor.    Vitals:  - Pain: no complaint of pain, no  request for PRN pain meds    Intake/Output:  - Urine: voids independently  - No nausea/vomiting     Safety/precautions:   - Falls precautions maintained  - Pt remained free of falls and injury this shift    Activity:  - ambulates independently    Miscellaneous:  - Wounds: liver biopsy site stable no complication     Problem: Adult Inpatient Plan of Care  Goal: Plan of Care Review  Outcome: Progressing  Flowsheets (Taken 07/17/2022 0227)  Plan of Care Reviewed With: patient  Goal: Patient-Specific Goal (Individualized)  Outcome: Progressing  Goal: Absence of Hospital-Acquired Illness or Injury  Outcome: Progressing  Intervention: Identify and Manage Fall Risk  Recent Flowsheet Documentation  Taken 07/17/2022 0205 by Robert Bellow, RN  Safety Interventions:   low bed   fall reduction program maintained   nonskid shoes/slippers when out of bed  Taken 07/16/2022 2215 by Robert Bellow, RN  Safety Interventions:   low bed   fall reduction program maintained   nonskid shoes/slippers when out of bed  Taken 07/16/2022 1955 by Robert Bellow, RN  Safety Interventions:   low bed   fall reduction program maintained   nonskid shoes/slippers when out of bed   isolation precautions  Intervention: Prevent Skin Injury  Recent Flowsheet Documentation  Taken 07/17/2022 0205 by Robert Bellow, RN  Positioning for Skin: Left  Device Skin Pressure Protection: absorbent pad utilized/changed  Skin Protection: adhesive use limited  Taken 07/16/2022 2215 by Robert Bellow, RN  Positioning for Skin: Supine/Back  Device Skin Pressure Protection: absorbent pad utilized/changed  Skin Protection: adhesive use limited  Taken 07/16/2022 1955 by Robert Bellow, RN  Positioning for Skin: Supine/Back  Device Skin Pressure Protection: absorbent pad utilized/changed  Skin Protection: adhesive use limited  Intervention: Prevent and Manage VTE (Venous Thromboembolism) Risk  Recent Flowsheet Documentation  Taken 07/17/2022 0205 by Robert Bellow, RN  Anti-Embolism Device Type: SCD, Knee  Anti-Embolism Intervention: Refused  Anti-Embolism Device Location: BLE  Taken 07/16/2022 2215 by Robert Bellow, RN  Anti-Embolism Device Type: SCD, Knee  Anti-Embolism Intervention: Refused  Anti-Embolism Device Location: BLE  Taken 07/16/2022 1955 by Robert Bellow, RN  Anti-Embolism Device Type: SCD, Knee  Anti-Embolism Intervention: Refused  Anti-Embolism Device Location: BLE  Goal: Optimal Comfort and Wellbeing  Outcome: Progressing  Goal: Readiness for Transition of Care  Outcome: Progressing  Goal: Rounds/Family Conference  Outcome: Progressing     Problem: Infection  Goal: Absence of Infection Signs and Symptoms  Outcome: Progressing  Intervention: Prevent or Manage Infection  Recent Flowsheet Documentation  Taken 07/16/2022 1955 by Robert Bellow, RN  Isolation Precautions: protective precautions maintained     Problem: Fall Injury Risk  Goal: Absence of Fall and Fall-Related Injury  Outcome: Progressing  Intervention: Promote Injury-Free Environment  Recent Flowsheet Documentation  Taken 07/17/2022 0205 by Robert Bellow, RN  Safety Interventions:   low bed   fall reduction program maintained   nonskid shoes/slippers when out of bed  Taken 07/16/2022 2215 by Robert Bellow, RN  Safety Interventions:   low bed   fall reduction program maintained   nonskid shoes/slippers when out of bed  Taken 07/16/2022 1955 by Robert Bellow, RN  Safety Interventions:   low  bed   fall reduction program maintained   nonskid shoes/slippers when out of bed   isolation precautions

## 2022-07-17 NOTE — Unmapped (Signed)
Transplant Surgery Progress Note    Hospital Day: 8    Assessment:     Matthew Key is a 61 y.o. 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 Celine Mans. He was admitted on 07/10/2022 after weekly post transplant labs revealed elevated transaminases and alk phos with persistent diarrhea for the last several weeks without improvement. Infectious workup negative.     Interval Events:     NAEON. Underwent percutaneous liver biopsy with VIR yesterday. Afebrile, VSS. Pain well-controlled. Had no episodes of diarrhea overnight. Adequate urine output. Labs stable, creatinine improved to 0.97 from 1.28. Biopsy results rushed and show no evidence of acute cellular rejection.    Plan:     Neuro:  - Pain well controlled  - Home gabapentin, duloxetine, APAP PRN, robaxin PRN    CV:  - HDS, Maintain SBP < 180  - Home amlodipine  - holding home aspirin; restart tomorrow 12/6    Pulm:  - Stable on room air    GI:  - F: medlocked  - E: Replete as needed  - N: regular diet  - pantoprazole  - IV mag 2g daily  - holing lomotil - pt concerned it is caused for elevated LFTs  - continue metamucil TID  - GI consulted; appreciate recs   - s/p colonoscopy 07/15/22 - no acute findings on exam  - Colonic mucosa with patchy mildly increased lamina propria chronic inflammation, somewhat prominent eosinophils, focal minimal active inflammation, and focal lamina propria hemorrhage  - No significant increase in intraepithelial lymphocytes or subepithelial collagen, granulomas, viral cytopathic effect, or dysplasia identified  - s/p percutaneous liver biopsy with VIR 12/5   - negative for acute cellular rejection     GU:  - Voiding spontaneously    Endo:  - endocrine consulted, appreciate recs  - NPH 6U q12h  - lispro target 140, ISF 30 achs  - hypoglycemia protocol  - POCT-BG achs     Heme/ID:  - Afebrile, WBC and Hgb stable  - ppx with pentamidine (hold atovaquone)  - C. Diff, GI Path Panel, CMV, O&P negative  - continue SQH for DVT ppx    MSK:  - PT consult for R hip pain: recommend outpatient PT prn    Immuno:  - tac, everolimus  - pharmacy recommendations for dosing      Dispo  - Floor      Objective:        Vital Signs:  BP 142/72  - Pulse 68  - Temp 36.5 ??C (97.7 ??F) (Oral)  - Resp 18  - Ht 172.7 cm (5' 8)  - Wt 87.7 kg (193 lb 4.8 oz)  - SpO2 98%  - BMI 29.39 kg/m??     Input/Output:  I/O last 3 completed shifts:  In: 2827.5 [P.O.:220; I.V.:1191.7; IV Piggyback:1415.8]  Out: 3575 [Urine:3575]    Physical Exam:    General: Cooperative, no distress, well appearing male, lying comfortably in bed  Pulmonary: Normal work of breathing, on room air  Cardiovascular: Regular rate  Abdomen: Soft, non-tender, non-distended. Surgical incision well-healed.   Musculoskeletal: Moves extremities spontaneously   Neurologic: Alert and interactive, grossly intact    Labs:  Lab Results   Component Value Date    WBC 5.6 07/17/2022    HGB 10.7 (L) 07/17/2022    HCT 31.0 (L) 07/17/2022    PLT 118 (L) 07/17/2022       Lab Results  Component Value Date    NA 142 07/17/2022    K 3.9 07/17/2022    CL 108 (H) 07/17/2022    CO2 27.0 07/17/2022    BUN 8 (L) 07/17/2022    CREATININE 0.97 07/17/2022    CALCIUM 8.8 07/17/2022    MG 1.8 07/17/2022    PHOS 3.9 07/17/2022       Microbiology Results (last day)       Procedure Component Value Date/Time Date/Time    C. Difficile Assay [[id number]]  (Normal) Collected: 07/11/22 0603    Lab Status: Final result Specimen: Stool  Updated: 07/11/22 0843     C. Diff Result Negative     Comment: Clostridium difficile NOT detected       Narrative:      The methodology of this test detects C. difficile toxin A and/or toxin B, by EIA.        GI Pathogen Panel [[id number]] Collected: 07/11/22 0603    Lab Status: In process Specimen: Stool  Updated: 07/11/22 0640    Comprehensive Ova and Parasite Exam [[id number]] Collected: 07/11/22 0605    Lab Status: In process Specimen: Stool  Updated: 07/11/22 0640            Imaging:  All pertinent imaging personally reviewed.    Montez Hageman, MD  PGY-1, General Surgery  [facility]

## 2022-07-18 LAB — CBC
HEMATOCRIT: 35.4 % — ABNORMAL LOW (ref 39.0–48.0)
HEMOGLOBIN: 11.8 g/dL — ABNORMAL LOW (ref 12.9–16.5)
MEAN CORPUSCULAR HEMOGLOBIN CONC: 33.5 g/dL (ref 32.0–36.0)
MEAN CORPUSCULAR HEMOGLOBIN: 26.6 pg (ref 25.9–32.4)
MEAN CORPUSCULAR VOLUME: 79.3 fL (ref 77.6–95.7)
MEAN PLATELET VOLUME: 9.5 fL (ref 6.8–10.7)
PLATELET COUNT: 150 10*9/L (ref 150–450)
RED BLOOD CELL COUNT: 4.46 10*12/L (ref 4.26–5.60)
RED CELL DISTRIBUTION WIDTH: 13.1 % (ref 12.2–15.2)
WBC ADJUSTED: 7.6 10*9/L (ref 3.6–11.2)

## 2022-07-18 LAB — PHOSPHORUS: PHOSPHORUS: 4.1 mg/dL (ref 2.4–5.1)

## 2022-07-18 LAB — COMPREHENSIVE METABOLIC PANEL
ALBUMIN: 3.2 g/dL — ABNORMAL LOW (ref 3.4–5.0)
ALKALINE PHOSPHATASE: 291 U/L — ABNORMAL HIGH (ref 46–116)
ALT (SGPT): 120 U/L — ABNORMAL HIGH (ref 10–49)
ANION GAP: 6 mmol/L (ref 5–14)
AST (SGOT): 31 U/L (ref <=34)
BILIRUBIN TOTAL: 0.4 mg/dL (ref 0.3–1.2)
BLOOD UREA NITROGEN: 11 mg/dL (ref 9–23)
BUN / CREAT RATIO: 10
CALCIUM: 9.1 mg/dL (ref 8.7–10.4)
CHLORIDE: 109 mmol/L — ABNORMAL HIGH (ref 98–107)
CO2: 24 mmol/L (ref 20.0–31.0)
CREATININE: 1.06 mg/dL
EGFR CKD-EPI (2021) MALE: 79 mL/min/{1.73_m2} (ref >=60)
GLUCOSE RANDOM: 131 mg/dL (ref 70–179)
POTASSIUM: 4.4 mmol/L (ref 3.4–4.8)
PROTEIN TOTAL: 6.3 g/dL (ref 5.7–8.2)
SODIUM: 139 mmol/L (ref 135–145)

## 2022-07-18 LAB — MAGNESIUM: MAGNESIUM: 1.6 mg/dL (ref 1.6–2.6)

## 2022-07-18 LAB — TACROLIMUS LEVEL, TIMED: TACROLIMUS BLOOD: 4.5 ng/mL

## 2022-07-18 LAB — BILIRUBIN, DIRECT: BILIRUBIN DIRECT: 0.1 mg/dL (ref 0.00–0.30)

## 2022-07-18 LAB — GAMMA GT: GAMMA GLUTAMYL TRANSFERASE: 329 U/L — ABNORMAL HIGH

## 2022-07-18 LAB — EVEROLIMUS: EVEROLIMUS LEVEL: 4 ng/mL (ref 3.0–15.0)

## 2022-07-18 MED ORDER — RIFAXIMIN 550 MG TABLET
ORAL_TABLET | Freq: Three times a day (TID) | ORAL | 0 refills | 14 days | Status: CP
Start: 2022-07-18 — End: 2022-08-01
  Filled 2022-07-19: qty 42, 14d supply, fill #0

## 2022-07-18 MED ADMIN — tacrolimus (PROGRAF) capsule 3 mg: 3 mg | ORAL | @ 11:00:00

## 2022-07-18 MED ADMIN — ursodioL (ACTIGALL) capsule 300 mg: 300 mg | ORAL | @ 21:00:00

## 2022-07-18 MED ADMIN — magnesium sulfate 2gm/50mL IVPB: 2 g | INTRAVENOUS | @ 13:00:00

## 2022-07-18 MED ADMIN — gabapentin (NEURONTIN) capsule 300 mg: 300 mg | ORAL | @ 13:00:00

## 2022-07-18 MED ADMIN — insulin NPH (HumuLIN,NovoLIN) injection 5 Units: 5 [IU] | SUBCUTANEOUS | @ 16:00:00

## 2022-07-18 MED ADMIN — acetaminophen (TYLENOL) tablet 650 mg: 650 mg | ORAL | @ 13:00:00

## 2022-07-18 MED ADMIN — loperamide (IMODIUM) capsule 2 mg: 2 mg | ORAL

## 2022-07-18 MED ADMIN — insulin lispro (HumaLOG) injection 2 Units: 2 [IU] | SUBCUTANEOUS | @ 19:00:00

## 2022-07-18 MED ADMIN — rifAXIMin (XIFAXAN) tablet 550 mg: 550 mg | ORAL | @ 16:00:00 | Stop: 2022-08-01

## 2022-07-18 MED ADMIN — everolimus (immunosuppressive) (ZORTRESS) tablet 1 mg: 1 mg | ORAL

## 2022-07-18 MED ADMIN — DULoxetine (CYMBALTA) DR capsule 60 mg: 60 mg | ORAL | @ 13:00:00

## 2022-07-18 MED ADMIN — heparin (porcine) 5,000 unit/mL injection 5,000 Units: 5000 [IU] | SUBCUTANEOUS | @ 19:00:00

## 2022-07-18 MED ADMIN — heparin (porcine) 5,000 unit/mL injection 5,000 Units: 5000 [IU] | SUBCUTANEOUS | @ 02:00:00

## 2022-07-18 MED ADMIN — gabapentin (NEURONTIN) capsule 300 mg: 300 mg | ORAL | @ 02:00:00

## 2022-07-18 MED ADMIN — sodium chloride 0.9% (NS) bolus 1,000 mL: 1000 mL | INTRAVENOUS | @ 16:00:00 | Stop: 2022-07-18

## 2022-07-18 MED ADMIN — aspirin chewable tablet 81 mg: 81 mg | ORAL | @ 13:00:00

## 2022-07-18 MED ADMIN — methocarbamoL (ROBAXIN) tablet 500 mg: 500 mg | ORAL | @ 13:00:00

## 2022-07-18 MED ADMIN — amlodipine (NORVASC) tablet 10 mg: 10 mg | ORAL | @ 13:00:00

## 2022-07-18 MED ADMIN — everolimus (immunosuppressive) (ZORTRESS) tablet 2 mg: 2 mg | ORAL | @ 11:00:00

## 2022-07-18 MED ADMIN — tacrolimus (PROGRAF) capsule 2 mg: 2 mg | ORAL

## 2022-07-18 MED ADMIN — loperamide (IMODIUM) capsule 2 mg: 2 mg | ORAL | @ 16:00:00

## 2022-07-18 MED ADMIN — rifAXIMin (XIFAXAN) tablet 550 mg: 550 mg | ORAL | Stop: 2022-08-01

## 2022-07-18 MED ADMIN — heparin (porcine) 5,000 unit/mL injection 5,000 Units: 5000 [IU] | SUBCUTANEOUS | @ 11:00:00

## 2022-07-18 MED ADMIN — insulin NPH (HumuLIN,NovoLIN) injection 6 Units: 6 [IU] | SUBCUTANEOUS | @ 02:00:00

## 2022-07-18 MED ADMIN — psyllium (METAMUCIL) 3.4 gram packet 1 packet: 1 | ORAL | @ 13:00:00

## 2022-07-18 MED ADMIN — insulin lispro (HumaLOG) injection 0-20 Units: 0-20 [IU] | SUBCUTANEOUS | @ 19:00:00

## 2022-07-18 MED ADMIN — gabapentin (NEURONTIN) capsule 300 mg: 300 mg | ORAL | @ 19:00:00

## 2022-07-18 MED ADMIN — ursodioL (ACTIGALL) capsule 300 mg: 300 mg | ORAL | @ 16:00:00

## 2022-07-18 MED ADMIN — methocarbamoL (ROBAXIN) tablet 500 mg: 500 mg | ORAL | @ 02:00:00

## 2022-07-18 MED ADMIN — melatonin tablet 3 mg: 3 mg | ORAL | @ 02:00:00

## 2022-07-18 NOTE — Unmapped (Signed)
Transplant Surgery Progress Note    Hospital Day: 9    Assessment:     Matthew Key is a 61 y.o. 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 Celine Mans. He was admitted on 07/10/2022 after weekly post transplant labs revealed elevated transaminases and alk phos with persistent diarrhea for the last several weeks without improvement. Infectious workup negative.     Interval Events:     NAEON. Afebrile, VSS. Satting well on room air. Continues to have persistent diarrhea. Plan to start immodium today. Will give fluids this Am. Start rifaximin for 14 day course.     Plan:     Neuro:  - Pain well controlled  - Home gabapentin, duloxetine, APAP PRN, robaxin PRN    CV:  - HDS, Maintain SBP < 180  - Home amlodipine  - ASA 81mg   - Echo this AM with EF of 60-65%    Pulm:  - Stable on room air    GI:  - F: medlocked   - 1L NS bolus for persistent diarrhea overnight  - E: Replete as needed  - N: regular diet  - pantoprazole  - IV mag 2g daily  - start immodium 2mg  TID  - start rifaximin for 14-day course  - continue metamucil TID  - GI consulted; appreciate recs   - s/p colonoscopy 07/15/22 - no acute findings on exam  - Colonic mucosa with patchy mildly increased lamina propria chronic inflammation, somewhat prominent eosinophils, focal minimal active inflammation, and focal lamina propria hemorrhage  - No significant increase in intraepithelial lymphocytes or subepithelial collagen, granulomas, viral cytopathic effect, or dysplasia identified  - s/p percutaneous liver biopsy with VIR 12/5   - negative for acute cellular rejection    - CMV stain negative    GU:  - Voiding spontaneously    Endo:  - endocrine consulted, appreciate recs  - NPH 6U q12h  - lispro target 140, ISF 30 achs  - hypoglycemia protocol  - POCT-BG achs     Heme/ID:  - Afebrile, WBC and Hgb stable  - ppx with pentamidine (hold atovaquone)  - C. Diff, GI Path Panel, CMV, O&P negative  - continue SQH for DVT ppx    MSK:  - PT consult for R hip pain: recommend outpatient PT prn    Immuno:  - tac, everolimus  - pharmacy recommendations for dosing      Dispo  - Floor      Objective:        Vital Signs:  BP 148/68  - Pulse 65  - Temp 36.5 ??C (97.7 ??F) (Oral)  - Resp 18  - Ht 172.7 cm (5' 8)  - Wt 86 kg (189 lb 9.6 oz)  - SpO2 97%  - BMI 28.83 kg/m??     Input/Output:  I/O last 3 completed shifts:  In: 3160 [P.O.:960; I.V.:2150; IV Piggyback:50]  Out: 2701 [Urine:2700; Stool:1]    Physical Exam:    General: Cooperative, no distress, well appearing male, lying comfortably in bed  Pulmonary: Normal work of breathing, on room air  Cardiovascular: Regular rate  Abdomen: Soft, non-tender, non-distended. Surgical incision well-healed.   Musculoskeletal: Moves extremities spontaneously   Neurologic: Alert and interactive, grossly intact    Labs:  Lab Results   Component Value Date    WBC 7.6 07/18/2022    HGB 11.8 (L) 07/18/2022    HCT 35.4 (L) 07/18/2022    PLT  150 07/18/2022       Lab Results   Component Value Date    NA 139 07/18/2022    K 4.4 07/18/2022    CL 109 (H) 07/18/2022    CO2 24.0 07/18/2022    BUN 11 07/18/2022    CREATININE 1.06 07/18/2022    CALCIUM 9.1 07/18/2022    MG 1.6 07/18/2022    PHOS 4.1 07/18/2022       Microbiology Results (last day)       Procedure Component Value Date/Time Date/Time    C. Difficile Assay [[id number]]  (Normal) Collected: 07/11/22 0603    Lab Status: Final result Specimen: Stool  Updated: 07/11/22 0843     C. Diff Result Negative     Comment: Clostridium difficile NOT detected       Narrative:      The methodology of this test detects C. difficile toxin A and/or toxin B, by EIA.        GI Pathogen Panel [[id number]] Collected: 07/11/22 0603    Lab Status: In process Specimen: Stool  Updated: 07/11/22 0640    Comprehensive Ova and Parasite Exam [[id number]] Collected: 07/11/22 0605    Lab Status: In process Specimen: Stool  Updated: 07/11/22 0640            Imaging:  All pertinent imaging personally reviewed.    Montez Hageman, MD  PGY-1, General Surgery  [facility]

## 2022-07-18 NOTE — Unmapped (Signed)
Reviewed plan of care with patient who indicates understanding. Pt has no questions or concerns at this time. Will continue to monitor.    Vitals:  - Pain: robaxin PRN given for muscle spasm pain, no further complaints    Intake/Output:  - BM:  pt reports having multiple BM's loose, every couple hours    Safety/precautions:   - Falls precautions maintained  - Pt remained free of falls and injury this shift    Activity:  - up ad lib independently  - awaiting echocardiogram today     Problem: Adult Inpatient Plan of Care  Goal: Plan of Care Review  Outcome: Progressing  Flowsheets (Taken 07/18/2022 0428)  Plan of Care Reviewed With: patient  Goal: Patient-Specific Goal (Individualized)  Outcome: Progressing  Goal: Absence of Hospital-Acquired Illness or Injury  Outcome: Progressing  Intervention: Identify and Manage Fall Risk  Recent Flowsheet Documentation  Taken 07/18/2022 0401 by Robert Bellow, RN  Safety Interventions:   low bed   fall reduction program maintained   nonskid shoes/slippers when out of bed  Taken 07/18/2022 0155 by Robert Bellow, RN  Safety Interventions:   low bed   fall reduction program maintained   nonskid shoes/slippers when out of bed  Taken 07/18/2022 0010 by Robert Bellow, RN  Safety Interventions:   low bed   fall reduction program maintained   nonskid shoes/slippers when out of bed  Taken 07/17/2022 2200 by Robert Bellow, RN  Safety Interventions:   low bed   fall reduction program maintained   nonskid shoes/slippers when out of bed  Taken 07/17/2022 1955 by Robert Bellow, RN  Safety Interventions:   low bed   fall reduction program maintained   nonskid shoes/slippers when out of bed  Intervention: Prevent Skin Injury  Recent Flowsheet Documentation  Taken 07/18/2022 0401 by Robert Bellow, RN  Positioning for Skin: Left  Device Skin Pressure Protection: absorbent pad utilized/changed  Skin Protection: adhesive use limited  Taken 07/18/2022 0155 by Robert Bellow, RN  Positioning for Skin: Right  Device Skin Pressure Protection: absorbent pad utilized/changed  Skin Protection: adhesive use limited  Taken 07/18/2022 0010 by Robert Bellow, RN  Positioning for Skin: Left  Device Skin Pressure Protection: absorbent pad utilized/changed  Skin Protection: adhesive use limited  Taken 07/17/2022 2200 by Robert Bellow, RN  Positioning for Skin: Left  Device Skin Pressure Protection: absorbent pad utilized/changed  Skin Protection: adhesive use limited  Taken 07/17/2022 1955 by Robert Bellow, RN  Positioning for Skin: Left  Device Skin Pressure Protection: absorbent pad utilized/changed  Skin Protection: adhesive use limited  Intervention: Prevent and Manage VTE (Venous Thromboembolism) Risk  Recent Flowsheet Documentation  Taken 07/18/2022 0401 by Robert Bellow, RN  Anti-Embolism Device Type: SCD, Knee  Anti-Embolism Intervention: Refused  Anti-Embolism Device Location: BLE  Taken 07/18/2022 0155 by Robert Bellow, RN  Anti-Embolism Device Type: SCD, Knee  Anti-Embolism Intervention: Refused  Anti-Embolism Device Location: BLE  Taken 07/18/2022 0010 by Robert Bellow, RN  Anti-Embolism Device Type: SCD, Knee  Anti-Embolism Intervention: Refused  Anti-Embolism Device Location: BLE  Taken 07/17/2022 2200 by Stanford Breed E, RN  Anti-Embolism Device Type: SCD, Knee  Anti-Embolism Intervention: Refused  Anti-Embolism Device Location: BLE  Taken 07/17/2022 1955 by Robert Bellow, RN  Anti-Embolism Device Type: SCD, Knee  Anti-Embolism Intervention: Refused  Anti-Embolism Device Location: BLE  Goal: Optimal Comfort and Wellbeing  Outcome: Progressing  Goal: Readiness for Transition of  Care  Outcome: Progressing  Goal: Rounds/Family Conference  Outcome: Progressing     Problem: Infection  Goal: Absence of Infection Signs and Symptoms  Outcome: Progressing  Intervention: Prevent or Manage Infection  Recent Flowsheet Documentation  Taken 07/17/2022 1955 by Robert Bellow, RN  Isolation Precautions: protective precautions maintained     Problem: Fall Injury Risk  Goal: Absence of Fall and Fall-Related Injury  Outcome: Progressing  Intervention: Promote Scientist, clinical (histocompatibility and immunogenetics) Documentation  Taken 07/18/2022 0401 by Robert Bellow, RN  Safety Interventions:   low bed   fall reduction program maintained   nonskid shoes/slippers when out of bed  Taken 07/18/2022 0155 by Robert Bellow, RN  Safety Interventions:   low bed   fall reduction program maintained   nonskid shoes/slippers when out of bed  Taken 07/18/2022 0010 by Robert Bellow, RN  Safety Interventions:   low bed   fall reduction program maintained   nonskid shoes/slippers when out of bed  Taken 07/17/2022 2200 by Robert Bellow, RN  Safety Interventions:   low bed   fall reduction program maintained   nonskid shoes/slippers when out of bed  Taken 07/17/2022 1955 by Robert Bellow, RN  Safety Interventions:   low bed   fall reduction program maintained   nonskid shoes/slippers when out of bed

## 2022-07-18 NOTE — Unmapped (Signed)
Endocrine Team Diabetes Follow Up Consult Note     Consult information:  Requesting Attending Physician : Florene Glen, MD  Service Requesting Consult : Surg Transplant [phone])  Primary Care Provider: Card, [name], MD  Impression:  Matthew Key is a 61 y.o. male admitted for diarrhea. We have been consulted at the request of Chirag Lanney Gins, MD to evaluate Matthew Key for hyperglycemia.     Medical Decision Making:  Diagnoses:  1.Type 2 Diabetes. Uncontrolled With hyperglycemia.  2. Nutrition: Complicating glycemic control. Increasing risk for both hypoglycemia and hyperglycemia.  3. Transplant. Complicating glycemic control and increasing risk for hyperglycemia.  4.  Elevated transaminases. Complicating glycemic control and increasing risk for hyperglycemia and hypoglycemia  5. History of secondary adrenal insufficiency.       Studies reviewed 07/18/22:  Labs: POCT-BG  Interpretation: Hyperglycemia.    Notes reviewed: Primary team and nursing notes      Overall impression based on above reviews and history  Hyperglycemic, some severe after po intake. Will hold off on increasing doses given basal was reduced yesterday morning while NPO. Has full dose on board today.     Recommendations:  - NPH 6u q12h  - lispro 2u qAC  - lispro target 140, ISF 30 achs  - Hypoglycemia protocol.  - POCT-BG achs.  - Ensure patient is on glucose precautions if patient taking nutrition by mouth.     Discharge planning:  Discharge back on home regimen given good control and no hypoglycemia. Discharge med rec updated 12/1.    Thank you for this consult. Discussed plan with primary team. We will continue to follow and make recommendations and place orders as appropriate.    Please page with questions or concerns: Copelan Maultsby, NP: [phone]  DCT on call from 6AM - 3PM on weekdays then endocrine fellow on call: [phone] from 3PM - 6AM on weekdays and on weekends and holidays.   If APP cannot be reached, please page the endocrine fellow on call.      Subjective:  Interval history: NAEO. States diarrhea has improved. Denies complaints at time of rounds. BG tightly controlled.      Initial HPI:  Matthew Key is a 61 y.o. male with a PMH of  has a past medical history of AKI (acute kidney injury) (CMS-HCC) (12/14/2020), Anxiety (10/22/2013), Arthritis, Cervical radiculopathy (12/03/2016), Chronic pain disorder, Cirrhosis (CMS-HCC), Dental abscess (10/2020), Duodenal ulcer (12/01/2017), GERD (gastroesophageal reflux disease), History of transfusion, Hyperlipidemia (10/22/2013), Hypertension, Hypotension (01/29/2022), Liver disease, Sleep apnea, obstructive, Stroke (CMS-HCC), and Type 2 diabetes mellitus with diabetic neuropathy, with long-term current use of insulin (CMS-HCC) (06/09/2014). admitted on 07/10/2022 for an after weekly post transplant labs revealed elevated transaminases and alk phos with persistent diarrhea for the last several weeks without improvement.     Diabetes History:  Patient has a history of Type 2 diabetes diagnosed 2017.  Diabetes is managed by: PCP.  Current home diabetes regimen: tresiba 13 units at bedtime, novolog correction and ozempic 1mg  qweek (Sundays, last dose 11/26).    Current home blood glucose monitoring:  3-4 times a day.  Hypoglycemia awareness: yes.  Complications related to diabetes: peripheral neuropathy      Endocrine History: History of secondary adrenal insufficiency. Had been receiving steroids steroids for spinal injections. We saw back in January of this year and noted HPA axis recovery based on ACTH stim test (cortisol went from 8.4-> 22.9). Since then he was prescribed hydrocortisone 10mg  every day prn illness, but  states he hasn't taken any. Has not taken any steroids this calendar year (2023).    Current Nutrition:  Active Orders   Diet    Nutrition Therapy Regular/House       ROS: As per HPI.     amlodipine  10 mg Oral Daily    aspirin  81 mg Oral Daily    DULoxetine  60 mg Oral Daily    everolimus (immunosuppressive)  1 mg Oral Nightly    everolimus  2 mg Oral daily    gabapentin  300 mg Oral TID    heparin (porcine) for subcutaneous use  5,000 Units Subcutaneous Q8H SCH    insulin lispro  0-20 Units Subcutaneous ACHS    insulin lispro  2 Units Subcutaneous 3xd Meals    insulin NPH  5 Units Subcutaneous Q12H    magnesium sulfate  2 g Intravenous Daily    melatonin  3 mg Oral Nightly    psyllium  1 packet Oral TID    rifAXIMin  550 mg Oral TID    tacrolimus  2 mg Oral Nightly    tacrolimus  3 mg Oral Daily       Current Outpatient Medications   Medication Instructions    acetaminophen (TYLENOL) 325-650 mg, Oral, Every 6 hours PRN    amlodipine (NORVASC) 10 mg, Oral, Daily (standard)    aspirin (ECOTRIN) 81 mg, Oral, Daily (standard)    atorvastatin (LIPITOR) 40 mg, Oral, Daily (standard), Hold until directed to take by nurse coordinator    atovaquone (MEPRON) 1,500 mg, Oral, Daily (standard)    blood sugar diagnostic (ACCU-CHEK GUIDE TEST STRIPS) Strp Use to check blood sugar as directed with insulin 3 times a day & for symptoms of high or low blood sugar    blood-glucose meter kit Use as instructed    blood-glucose meter kit Use as instructed    blood-glucose meter kit Use as instructed    blood-glucose meter Misc Use to test blood sugar as directed    DULoxetine (CYMBALTA) 60 mg, Oral, Daily (standard)    everolimus, immunosuppressive, (ZORTRESS) 0.5 mg tablet Take 4 tablets (2 mg total) by mouth daily AND 2 tablets (1 mg total) nightly.    gabapentin (NEURONTIN) 300 mg, Oral, 3 times a day (standard)    glucagon spray 3 mg/actuation Spry Use 1 spray into the left nostril every fifteen (15) minutes as needed (low blood sugar).    hydrOXYzine (VISTARIL) 50 mg, Oral, Nightly PRN    insulin ASPART (NOVOLOG FLEXPEN) 100 unit/mL (3 mL) injection pen Inject under the skin per sliding scale prior to meals. If premeal BG 151-200 take 2 additional units for correction, if 201-250 take 4 additional units, etc. Max dose 36 units per day.  Store in-use prefilled pens at room temperature <86??F and use within 28 days; do not refrigerate.    lancets (ACCU-CHEK SOFTCLIX LANCETS) Misc Use to check blood sugar as directed with insulin 3 times a day & for symptoms of high or low blood sugar.    magnesium oxide-Mg AA chelate (MAGNESIUM, AMINO ACID CHELATE,) 133 mg 2 tablets, Oral, 2 times a day    melatonin 3 mg, Oral, Every evening    methocarbamoL (ROBAXIN) 500 mg, Oral, 3 times a day PRN    OZEMPIC 0.5 mg, Subcutaneous, Every 7 days    pantoprazole (PROTONIX) 40 mg, Oral, Daily PRN    patiromer calcium sorbitex (VELTASSA) 16.8 g, Oral, 2 times a day    pen needle, diabetic (  BD ULTRA-FINE NANO PEN NEEDLE) 32 gauge x 5/32 (4 mm) Ndle Use as directed  1-2 times per day    polyethylene glycol (MIRALAX) 17 gram/dose powder Mix 17 g (use measure line in cap) in 4-8 ounces of water, juice, soda, coffee, or tea and take by mouth daily as needed.    psyllium (METAMUCIL) 3.4 gram packet 1 packet, Oral, 2 times a day (standard)    rifAXIMin (XIFAXAN) 550 mg, Oral, 3 times a day (standard)    tacrolimus (PROGRAF) 1 MG capsule Take 3 capsules (3 mg total) by mouth daily AND 2 capsules (2 mg total) nightly.    TRESIBA FLEXTOUCH U-100 13 Units, Subcutaneous, Daily (standard), Adjust as instructed.    VELTASSA 16.8 g, Oral, Daily (standard)           Past Medical History:   Diagnosis Date    AKI (acute kidney injury) (CMS-HCC) 12/14/2020    Anxiety 10/22/2013    Arthritis     Cervical radiculopathy 12/03/2016    Chronic pain disorder     Lower back    Cirrhosis (CMS-HCC)     Dental abscess 10/2020    Duodenal ulcer 12/01/2017    GERD (gastroesophageal reflux disease)     History of transfusion     Hyperlipidemia 10/22/2013    Hypertension     under control with meds and weight loss    Hypotension 01/29/2022    Liver disease     Sleep apnea, obstructive     Have machine    Stroke (CMS-HCC)     mild stroke    Type 2 diabetes mellitus with diabetic neuropathy, with long-term current use of insulin (CMS-HCC) 06/09/2014       Past Surgical History:   Procedure Laterality Date    KNEE SURGERY      PR CATH PLACE/CORON ANGIO, IMG SUPER/INTERP,R&L HRT CATH, L HRT VENTRIC N/A 12/19/2020    Procedure: CATH LEFT/RIGHT HEART CATHETERIZATION;  Surgeon: Rosana Hoes, MD;  Location: [facility] CATH;  Service: Cardiology    PR COLONOSCOPY FLX DX W/COLLJ SPEC WHEN PFRMD N/A 12/07/2021    Procedure: COLONOSCOPY, FLEXIBLE, PROXIMAL TO SPLENIC FLEXURE; DIAGNOSTIC, W/WO COLLECTION SPECIMEN BY BRUSH OR WASH;  Surgeon: Annie Paras, MD;  [facility]: [facility];  Service: Gastroenterology    PR COLONOSCOPY W/BIOPSY SINGLE/MULTIPLE N/A 07/15/2022    Procedure: COLONOSCOPY, FLEXIBLE, PROXIMAL TO SPLENIC FLEXURE; WITH BIOPSY, SINGLE OR MULTIPLE;  Surgeon: Leland Her, MD;  [facility];  Service: Gastroenterology    PR TRANSPLANT LIVER,ALLOTRANSPLANT N/A 02/06/2022    Procedure: LIVER ALLOTRANSPLANTATION; ORTHOTOPIC, PARTIAL OR WHOLE, FROM CADAVER OR LIVING DONOR, ANY AGE;  Surgeon: Florene Glen, MD;  Location: MAIN OR [place];  Service: Transplant    PR TRANSPLANT,PREP DONOR LIVER/ARTERIAL N/A 02/06/2022    Procedure: BACKBNCH RECONSTRUCT OF CAD/LIVE DONOR LIVER GFT PRIOR TRANSPLANT; ARTERIAL ANASTAMOSIS, EA;  Surgeon: Florene Glen, MD;  Location: MAIN OR [address];  Service: Transplant    PR UPPER GI ENDOSCOPY,BIOPSY N/A 10/12/2020    Procedure: UGI ENDOSCOPY; WITH BIOPSY, SINGLE OR MULTIPLE;  Surgeon: Marene Lenz, MD;  [facility];  Service: Gastroenterology    PR UPPER GI ENDOSCOPY,DIAGNOSIS N/A 01/03/2022    Procedure: UGI ENDO, INCLUDE ESOPHAGUS, STOMACH, & DUODENUM &/OR JEJUNUM; DX W/WO COLLECTION SPECIMN, BY BRUSH OR WASH;  Surgeon: Marene Lenz, MD;  [facility]: [facility];  Service: Gastroenterology    PR UPPER GI ENDOSCOPY,LIGAT VARIX N/A 12/07/2021  Procedure: UGI ENDO; W/BAND LIG ESOPH &/OR GASTRIC VARICES;  Surgeon: Annie Paras, MD;  Location: [facility];  Service: Gastroenterology    ROOT CANAL      Front teeth       Family History   Problem Relation Age of Onset    Edema Mother     Alzheimer's disease Father     Aortic dissection Brother     Early death Brother     Aneurysm Brother        Social History     Tobacco Use    Smoking status: Never    Smokeless tobacco: Never   Vaping Use    Vaping Use: Never used   Substance Use Topics    Alcohol use: Not Currently    Drug use: Not Currently       OBJECTIVE:  BP 153/75  - Pulse 66  - Temp 36.7 ??C (98.1 ??F) (Oral)  - Resp 16  - Ht 172.7 cm (5' 8)  - Wt 87.7 kg (193 lb 4.8 oz)  - SpO2 97%  - BMI 29.39 kg/m??   Wt Readings from Last 12 Encounters:   07/17/22 87.7 kg (193 lb 4.8 oz)   05/06/22 87.5 kg (193 lb)   04/08/22 86.7 kg (191 lb 1.6 oz)   04/08/22 86.7 kg (191 lb 1.6 oz)   03/11/22 86 kg (189 lb 11.2 oz)   02/25/22 88.6 kg (195 lb 4.8 oz)   02/25/22 88.6 kg (195 lb 4.8 oz)   02/18/22 96.3 kg (212 lb 3.2 oz)   02/11/22 94.8 kg (209 lb)   01/29/22 96.9 kg (213 lb 11.2 oz)   01/14/22 89.8 kg (198 lb)   01/03/22 88.9 kg (196 lb)     Physical Exam  Constitutional:       Appearance: Normal appearance.   Pulmonary:      Effort: Pulmonary effort is normal. No respiratory distress.   Skin:     General: Skin is warm and dry.   Neurological:      General: No focal deficit present.      Mental Status: He is alert and oriented to person, place, and time.             BG/insulin reviewed per EMR.   Glucose, POC (mg/dL)   Date Value   32/44/0102 124   07/18/2022 144   07/17/2022 125   07/17/2022 71   07/17/2022 93   07/16/2022 208 (H)   07/16/2022 108   07/16/2022 78        Summary of labs:  Lab Results   Component Value Date    A1C 6.5 (H) 05/06/2022    A1C 5.2 02/05/2022    A1C 4.7 (L) 01/14/2022     Lab Results   Component Value Date    CREATININE 1.06 07/18/2022     Lab Results   Component Value Date    WBC 7.6 07/18/2022    HGB 11.8 (L) 07/18/2022    HCT 35.4 (L) 07/18/2022    PLT 150 07/18/2022       Lab Results   Component Value Date    NA 139 07/18/2022    K 4.4 07/18/2022    CL 109 (H) 07/18/2022    CO2 24.0 07/18/2022    BUN 11 07/18/2022    CREATININE 1.06 07/18/2022    GLU 131 07/18/2022    CALCIUM 9.1 07/18/2022    MG 1.6 07/18/2022    PHOS 4.1 07/18/2022  Lab Results   Component Value Date    BILITOT 0.4 07/18/2022    BILIDIR 0.10 07/18/2022    PROT 6.3 07/18/2022    ALBUMIN 3.2 (L) 07/18/2022    ALT 120 (H) 07/18/2022    AST 31 07/18/2022    ALKPHOS 291 (H) 07/18/2022    GGT 329 (H) 07/18/2022

## 2022-07-19 LAB — GAMMA GT: GAMMA GLUTAMYL TRANSFERASE: 264 U/L — ABNORMAL HIGH

## 2022-07-19 LAB — CBC
HEMATOCRIT: 34.3 % — ABNORMAL LOW (ref 39.0–48.0)
HEMOGLOBIN: 11.6 g/dL — ABNORMAL LOW (ref 12.9–16.5)
MEAN CORPUSCULAR HEMOGLOBIN CONC: 33.8 g/dL (ref 32.0–36.0)
MEAN CORPUSCULAR HEMOGLOBIN: 26.6 pg (ref 25.9–32.4)
MEAN CORPUSCULAR VOLUME: 78.7 fL (ref 77.6–95.7)
MEAN PLATELET VOLUME: 9.6 fL (ref 6.8–10.7)
PLATELET COUNT: 137 10*9/L — ABNORMAL LOW (ref 150–450)
RED BLOOD CELL COUNT: 4.36 10*12/L (ref 4.26–5.60)
RED CELL DISTRIBUTION WIDTH: 13.5 % (ref 12.2–15.2)
WBC ADJUSTED: 6 10*9/L (ref 3.6–11.2)

## 2022-07-19 LAB — COMPREHENSIVE METABOLIC PANEL
ALBUMIN: 3.1 g/dL — ABNORMAL LOW (ref 3.4–5.0)
ALKALINE PHOSPHATASE: 241 U/L — ABNORMAL HIGH (ref 46–116)
ALT (SGPT): 83 U/L — ABNORMAL HIGH (ref 10–49)
ANION GAP: 5 mmol/L (ref 5–14)
AST (SGOT): 22 U/L (ref <=34)
BILIRUBIN TOTAL: 0.3 mg/dL (ref 0.3–1.2)
BLOOD UREA NITROGEN: 12 mg/dL (ref 9–23)
BUN / CREAT RATIO: 11
CALCIUM: 9.4 mg/dL (ref 8.7–10.4)
CHLORIDE: 106 mmol/L (ref 98–107)
CO2: 29 mmol/L (ref 20.0–31.0)
CREATININE: 1.11 mg/dL
EGFR CKD-EPI (2021) MALE: 75 mL/min/{1.73_m2} (ref >=60)
GLUCOSE RANDOM: 105 mg/dL (ref 70–179)
POTASSIUM: 4.1 mmol/L (ref 3.4–4.8)
PROTEIN TOTAL: 5.9 g/dL (ref 5.7–8.2)
SODIUM: 140 mmol/L (ref 135–145)

## 2022-07-19 LAB — BILIRUBIN, DIRECT: BILIRUBIN DIRECT: 0.1 mg/dL (ref 0.00–0.30)

## 2022-07-19 LAB — MAGNESIUM: MAGNESIUM: 1.9 mg/dL (ref 1.6–2.6)

## 2022-07-19 LAB — PHOSPHORUS: PHOSPHORUS: 4.4 mg/dL (ref 2.4–5.1)

## 2022-07-19 MED ORDER — URSODIOL 300 MG CAPSULE
ORAL_CAPSULE | Freq: Three times a day (TID) | ORAL | 11 refills | 30 days | Status: CP
Start: 2022-07-19 — End: ?
  Filled 2022-07-19: qty 90, 30d supply, fill #0

## 2022-07-19 MED ORDER — INSULIN DEGLUDEC (U-100) 100 UNIT/ML (3 ML) SUBCUTANEOUS PEN
Freq: Every day | SUBCUTANEOUS | 3 refills | 100 days | Status: CP
Start: 2022-07-19 — End: 2023-07-19
  Filled 2022-10-07: qty 15, 100d supply, fill #0

## 2022-07-19 MED ORDER — OZEMPIC 0.25 MG OR 0.5 MG (2 MG/3 ML) SUBCUTANEOUS PEN INJECTOR
SUBCUTANEOUS | 11 refills | 56 days | Status: CP
Start: 2022-07-19 — End: ?
  Filled 2022-08-07: qty 3, 28d supply, fill #0

## 2022-07-19 MED ORDER — LOPERAMIDE 2 MG CAPSULE
ORAL_CAPSULE | 5 refills | 0 days | Status: CP
Start: 2022-07-19 — End: ?
  Filled 2022-07-19: qty 60, 46d supply, fill #0

## 2022-07-19 MED ADMIN — ursodioL (ACTIGALL) capsule 300 mg: 300 mg | ORAL | @ 01:00:00

## 2022-07-19 MED ADMIN — insulin lispro (HumaLOG) injection 2 Units: 2 [IU] | SUBCUTANEOUS | @ 19:00:00 | Stop: 2022-07-19

## 2022-07-19 MED ADMIN — heparin (porcine) 5,000 unit/mL injection 5,000 Units: 5000 [IU] | SUBCUTANEOUS | @ 18:00:00 | Stop: 2022-07-19

## 2022-07-19 MED ADMIN — psyllium (METAMUCIL) 3.4 gram packet 1 packet: 1 | ORAL | @ 14:00:00 | Stop: 2022-07-19

## 2022-07-19 MED ADMIN — magnesium sulfate 2gm/50mL IVPB: 2 g | INTRAVENOUS | @ 14:00:00 | Stop: 2022-07-19

## 2022-07-19 MED ADMIN — gabapentin (NEURONTIN) capsule 300 mg: 300 mg | ORAL | @ 14:00:00 | Stop: 2022-07-19

## 2022-07-19 MED ADMIN — everolimus (immunosuppressive) (ZORTRESS) tablet 2 mg: 2 mg | ORAL | @ 11:00:00 | Stop: 2022-07-19

## 2022-07-19 MED ADMIN — gabapentin (NEURONTIN) capsule 300 mg: 300 mg | ORAL | @ 18:00:00 | Stop: 2022-07-19

## 2022-07-19 MED ADMIN — insulin lispro (HumaLOG) injection 0-20 Units: 0-20 [IU] | SUBCUTANEOUS | @ 04:00:00

## 2022-07-19 MED ADMIN — ursodioL (ACTIGALL) capsule 300 mg: 300 mg | ORAL | @ 18:00:00 | Stop: 2022-07-19

## 2022-07-19 MED ADMIN — heparin (porcine) 5,000 unit/mL injection 5,000 Units: 5000 [IU] | SUBCUTANEOUS | @ 03:00:00

## 2022-07-19 MED ADMIN — loperamide (IMODIUM) capsule 2 mg: 2 mg | ORAL | @ 11:00:00 | Stop: 2022-07-19

## 2022-07-19 MED ADMIN — melatonin tablet 3 mg: 3 mg | ORAL | @ 01:00:00

## 2022-07-19 MED ADMIN — ursodioL (ACTIGALL) capsule 300 mg: 300 mg | ORAL | @ 14:00:00 | Stop: 2022-07-19

## 2022-07-19 MED ADMIN — psyllium (METAMUCIL) 3.4 gram packet 1 packet: 1 | ORAL | @ 01:00:00

## 2022-07-19 MED ADMIN — insulin NPH (HumuLIN,NovoLIN) injection 5 Units: 5 [IU] | SUBCUTANEOUS | @ 15:00:00 | Stop: 2022-07-19

## 2022-07-19 MED ADMIN — amlodipine (NORVASC) tablet 10 mg: 10 mg | ORAL | @ 14:00:00 | Stop: 2022-07-19

## 2022-07-19 MED ADMIN — aspirin chewable tablet 81 mg: 81 mg | ORAL | @ 14:00:00 | Stop: 2022-07-19

## 2022-07-19 MED ADMIN — DULoxetine (CYMBALTA) DR capsule 60 mg: 60 mg | ORAL | @ 14:00:00 | Stop: 2022-07-19

## 2022-07-19 MED ADMIN — gabapentin (NEURONTIN) capsule 300 mg: 300 mg | ORAL | @ 01:00:00

## 2022-07-19 MED ADMIN — rifAXIMin (XIFAXAN) tablet 550 mg: 550 mg | ORAL | @ 18:00:00 | Stop: 2022-07-19

## 2022-07-19 MED ADMIN — loperamide (IMODIUM) capsule 2 mg: 2 mg | ORAL | @ 18:00:00 | Stop: 2022-07-19

## 2022-07-19 MED ADMIN — tacrolimus (PROGRAF) capsule 3 mg: 3 mg | ORAL | @ 11:00:00 | Stop: 2022-07-19

## 2022-07-19 MED ADMIN — insulin lispro (HumaLOG) injection 2 Units: 2 [IU] | SUBCUTANEOUS | @ 01:00:00

## 2022-07-19 MED ADMIN — heparin (porcine) 5,000 unit/mL injection 5,000 Units: 5000 [IU] | SUBCUTANEOUS | @ 11:00:00 | Stop: 2022-07-19

## 2022-07-19 MED ADMIN — insulin lispro (HumaLOG) injection 2 Units: 2 [IU] | SUBCUTANEOUS | @ 15:00:00 | Stop: 2022-07-19

## 2022-07-19 MED ADMIN — insulin NPH (HumuLIN,NovoLIN) injection 5 Units: 5 [IU] | SUBCUTANEOUS | @ 04:00:00

## 2022-07-19 MED ADMIN — rifAXIMin (XIFAXAN) tablet 550 mg: 550 mg | ORAL | @ 11:00:00 | Stop: 2022-07-19

## 2022-07-19 NOTE — Unmapped (Signed)
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    Discharge Date: 07/19/22    Colonoscopy on 12/4: single diverticulum was found in the sigmoid colon; internal small hemorrhoids were found during retroflexion   Liver biopsy on 12/5: no rejection, negative CMV stain  Biopsy of colon on 12/4: colonic mucosa with patchy mildly increased lamina propria chronic inflammation, somewhat prominent eosinophils, focal minimal active inflammation, and focal lamina propria hemorrhage   CMV PCR negative on 11/30  C diff, stool ova & parasites, GI pathogen panel = negative    Past Medical History:   Diagnosis Date    AKI (acute kidney injury) (CMS-HCC) 12/14/2020    Anxiety 10/22/2013    Arthritis     Cervical radiculopathy 12/03/2016    Chronic pain disorder     Lower back    Cirrhosis (CMS-HCC)     Dental abscess 10/2020    Duodenal ulcer 12/01/2017    GERD (gastroesophageal reflux disease)     History of transfusion     Hyperlipidemia 10/22/2013    Hypertension     under control with meds and weight loss    Hypotension 01/29/2022    Liver disease     Sleep apnea, obstructive     Have machine    Stroke (CMS-HCC)     mild stroke    Type 2 diabetes mellitus with diabetic neuropathy, with long-term current use of insulin (CMS-HCC) 06/09/2014       Immunosuppression regimen: Changed regimen on 04/08/22 (hx of tremors with higher tac levels, hyperkalemia, slow recovery of renal function).  Tacrolimus 3 mg in AM and 2 mg in PM; goal 3-5 ng/mL  Everolimus 2 mg in AM and 1 mg in PM (takes 0.5 mg tablets); goal 3-8 ng/mL    Antimicrobials during admission:   PJP ppx: atovaquone dc'd on admission -> pentamidine x 1 given inpatient to finish course (12/1)  CMV ppx: completed course (D+/R+)    Medication changes to be instituted upon discharge:  Pertinent new medications:  GI: rifaximin 550 mg TID x 14 days for possible bacterial overgrowth, ursodiol 300 mg TID, loperamide 2 mg TID x 1 week, then 2 mg daily PRN for loose stools    Changes in home medications:  - Due to holding Ozempic at this time, Tresiba dose was increased to 15 units daily    Home medications stopped/held:  - Holding atorvastatin given elevated LFTs -> restart outpatient when able  - DC'd atovaquone (see above), Mag, polyethylene glycol, Veltassa  - Holding Ozempic given GI concerns and gradual improvement in diarrhea    Suggested monitoring for outpatient follow-up:   IS/Liver: Tac and Everolimus doses were adjusted post-admission due to supratherapeutic levels. Levels have remained therapeutic with everolimus level on the lower end of goal range. Continue to monitor LFTs with most recent liver biopsy negative for rejection. Due to elevated liver function on admission, atorvastatin was held. Please resume outpatient once LFTs remain within normal limits.  GI: Stools have reduced since 12/7 and into 12/8 with ~4 stools yesterday. Restarted loperamide scheduled on 12/7 and to continue to take it scheduled for one week (through 12/15). Rifaximin was started on 12/7 for possible bacterial overgrowth for duration of 14 days. Ozempic  was held on discharge until diarrhea fully resolves.    [name],  PharmD

## 2022-07-19 NOTE — Unmapped (Signed)
Alert & oriented x4, vital signs stable on room air  Pain managed with prn robaxin and prn tylenol given once this shift   Patient ambulates to toilet independently and is producing adequate urine, no falls at this time  Several loose bowel movements, tolerating a regular diet with no nausea/vomiting  Imodium and fiber added to plan of care  No questions or concerns at this time    Problem: Adult Inpatient Plan of Care  Goal: Plan of Care Review  Outcome: Progressing  Goal: Patient-Specific Goal (Individualized)  Outcome: Progressing  Goal: Absence of Hospital-Acquired Illness or Injury  Outcome: Progressing  Intervention: Identify and Manage Fall Risk  Recent Flowsheet Documentation  Taken 07/18/2022 0800 by Cynda Acres, RN  Safety Interventions:   fall reduction program maintained   lighting adjusted for tasks/safety   low bed   nonskid shoes/slippers when out of bed  Intervention: Prevent Skin Injury  Recent Flowsheet Documentation  Taken 07/18/2022 0800 by Cynda Acres, RN  Positioning for Skin: Supine/Back  Device Skin Pressure Protection:   absorbent pad utilized/changed   adhesive use limited  Skin Protection: adhesive use limited  Intervention: Prevent and Manage VTE (Venous Thromboembolism) Risk  Recent Flowsheet Documentation  Taken 07/18/2022 1800 by Cynda Acres, RN  Anti-Embolism Device Type: SCD, Knee  Anti-Embolism Intervention: Refused  Anti-Embolism Device Location: BLE  Taken 07/18/2022 1600 by Cynda Acres, RN  Anti-Embolism Device Type: SCD, Knee  Anti-Embolism Intervention: Refused  Anti-Embolism Device Location: BLE  Taken 07/18/2022 1400 by Cynda Acres, RN  Anti-Embolism Device Type: SCD, Knee  Anti-Embolism Intervention: Refused  Anti-Embolism Device Location: BLE  Taken 07/18/2022 1200 by Cynda Acres, RN  Anti-Embolism Device Type: SCD, Knee  Anti-Embolism Intervention: Refused  Anti-Embolism Device Location: BLE  Taken 07/18/2022 1000 by Cynda Acres, RN  Anti-Embolism Device Type: SCD, Knee  Anti-Embolism Intervention: Refused  Anti-Embolism Device Location: BLE  Taken 07/18/2022 0800 by Cynda Acres, RN  Anti-Embolism Device Type: SCD, Knee  Anti-Embolism Intervention: Refused  Anti-Embolism Device Location: BLE  Intervention: Prevent Infection  Recent Flowsheet Documentation  Taken 07/18/2022 0800 by Cynda Acres, RN  Infection Prevention:   cohorting utilized   environmental surveillance performed   equipment surfaces disinfected   hand hygiene promoted   personal protective equipment utilized   rest/sleep promoted   single patient room provided  Goal: Optimal Comfort and Wellbeing  Outcome: Progressing  Goal: Readiness for Transition of Care  Outcome: Progressing  Goal: Rounds/Family Conference  Outcome: Progressing     Problem: Infection  Goal: Absence of Infection Signs and Symptoms  Outcome: Progressing  Intervention: Prevent or Manage Infection  Recent Flowsheet Documentation  Taken 07/18/2022 0800 by Cynda Acres, RN  Infection Management: aseptic technique maintained  Isolation Precautions: protective precautions maintained     Problem: Fall Injury Risk  Goal: Absence of Fall and Fall-Related Injury  Outcome: Progressing  Intervention: Identify and Manage Contributors  Recent Flowsheet Documentation  Taken 07/18/2022 0800 by Cynda Acres, RN  Self-Care Promotion: BADL personal objects within reach  Intervention: Promote Injury-Free Environment  Recent Flowsheet Documentation  Taken 07/18/2022 0800 by Cynda Acres, RN  Safety Interventions:   fall reduction program maintained   lighting adjusted for tasks/safety   low bed   nonskid shoes/slippers when out of bed

## 2022-07-19 NOTE — Unmapped (Signed)
Discharge Summary    Admit date: 07/10/2022    Discharge date and time: 07/19/22    Discharge to:  Home    Discharge Service: Surg Transplant [facility])    Discharge Attending Physician: Florene Glen, MD    Discharge  Diagnoses: liver transplant status, transaminitis, diarrhea    Secondary Diagnosis: Principal Problem:    Transaminitis of transplant liver (POA: Unknown)  Active Problems:    Liver transplant status (CMS-HCC) (POA: Not Applicable)    Diarrhea (POA: Unknown)  Resolved Problems:    * No resolved hospital problems. *      OR Procedures:    COLONOSCOPY, FLEXIBLE, PROXIMAL TO SPLENIC FLEXURE; WITH BIOPSY, SINGLE OR MULTIPLE  Date  07/15/2022  -------------------     Ancillary Procedures: no procedures    Discharge Day Services: The patient was seen and examined by the surgical team on the day of discharge. Vital signs and laboratory values were stable and appropriate for discharge. Discharge plan was discussed with patient, instructions for home care given, and all questions answered. More than 30 minutes was spent in discharge planning services.    Subjective   No acute events overnight. Pain Controlled. No fever or chills. 4 BMs x 24 hrs, improved from prior 8+ BMs. Tolerating diet, fluid intake appropriate      Objective   Patient Vitals for the past 8 hrs:   BP Temp Temp src Pulse Resp SpO2   07/19/22 1142 140/82 36.7 ??C (98.1 ??F) Oral 65 16 94 %     I/O this shift:  In: 370 [P.O.:360; I.V.:10]  Out: 0     General Appearance:   No acute distress, pleasant, talkative  Lungs:                NWOB on RA  Heart:                           Regular rate, HDS  Abdomen:                Soft, non-tender, non-distended. Mercedes scar well healed.   Extremities:              Warm and well perfused    Hospital Course:  Matthew Key is a 61 y.o. 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 Celine Mans. He was admitted on 07/10/2022 after weekly post transplant labs revealed elevated transaminases and alk phos with persistent diarrhea for the last several weeks without improvement. Liver doppler was obtained 07/10/22 and showed mild intrahepatic ductal dilatation with patent vessels. His PO mag was transitioned to IV, his atovaquone was transitioned to pentamidine, and he was not noted to be on other diarrhea causing medications. Infectious studies including C diff, stool ova & parasites, GI pathogen panel and serum CMV were obtained and were negative. He reported a recent fall on his right hip for which xrays were obtained, showing no fracture.    GI was consulted for ongoing workup of his diarrhea. He underwent colonoscopy on 07/15/22 which did not show any acute findings. Colon biopsies without inflammation or concern for CMV colitis. Diarrhea resolved after the colonoscopy. After his liver studies had normalized, he had an acute elevation in his LFTs on 07/14/22. VIR was consulted on 07/15/22 for percutaneous liver biopsy which was performed on 12/5 and revealed no acute cellular rejection, no CMV, and presence of kupffer cells suggesting recent/resolving  hepatocellular injury. LFTs downtrended at time of discharge. Suspect dehydration lead to hepatocellular injury in setting of diarrhea. Patient continued to have diarrhea, hepatology recommended to start rifaximin given likely SIBO following gastroenteritis, as well as fiber and loperamide given no current infectious etiology. Diarrhea frequency improved by time of discharge.      At the time of discharge patient was tolerating a regular diet, had pain controlled with PO pain medication, resolving diarrhea, and was able to return to the preoperative ambulatory status. Anti-rejection medication levels were monitored and dosages adjusted to maintain a therapeutic regimen with the input of Pharmacy. Physical therapy saw and assessed patient with recommendations for 3x weekly PT.  He will be discharged home in stable condition with home healthcare for PT. Hepatology and Surgery follow up visits scheduled.     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 Initially discharged post transplant on bactrim, and transitioned to atovaquone at some point outpatient (likely related to hyperkalemia)   -CMV: Completed treatment 05/09/22. CMV (D-/R+ or D+/R+): Moderate Risk.  Serum CMV DNA 11/30 not detected.  -ASA 81 at dc: continue  Diarrhea  -functional GI decline consult, hepatology to manage   -rifaximin x14 days, suspect SIBO following likely viral gastroenteritis   -loperamide scheduled x1 week then daily prn   -holding ozempic until diarrhea resolves  Endo  -hold ozempic due to diarrhea  -Basal increased to 15u qAM from 13u while ozempic held  -continue aspart 2:50>150 correctional     Condition at Discharge: Improved  Discharge Medications:      Medication List      START taking these medications     loperamide 2 mg capsule; Commonly known as: IMODIUM; Take 1 capsule by   mouth three times a day, end after 12/15, then take 1 capsule daily as   needed for loose stools   psyllium 3.4 gram packet; Commonly known as: METAMUCIL; Take 1 packet by   mouth two (2) times a day.   rifAXIMin 550 mg Tab; Commonly known as: XIFAXAN; Take 1 tablet (550 mg   total) by mouth Three (3) times a day for 14 days.   ursodioL 300 mg capsule; Commonly known as: ACTIGALL; Take 1 capsule   (300 mg total) by mouth Three (3) times a day.     CHANGE how you take these medications     * ACCU-CHEK GUIDE GLUCOSE METER Misc; Generic drug: blood-glucose meter;   Use to test blood sugar as directed; What changed: Another medication with   the same name was removed. Continue taking this medication, and follow the   directions you see here.   * ACCU-CHEK GUIDE GLUCOSE METER Misc; Generic drug: blood-glucose meter;   Use as instructed; What changed: Another medication with the same name was   removed. Continue taking this medication, and follow the directions you   see here.   everolimus (immunosuppressive) 0.5 mg tablet; Commonly known as:   ZORTRESS; Take 4 tablets (2 mg total) by mouth daily AND 2 tablets (1 mg   total) nightly.; What changed: See the new instructions.   insulin degludec 100 unit/mL (3 mL) Inpn; Commonly known as:  TRESIBA   FLEXTOUCH U-100; Inject 0.15 mL (15 Units total) under the skin daily.   Adjust as instructed.; What changed: how much to take   OZEMPIC 0.25 mg or 0.5 mg (2 mg/3 mL) Pnij; Generic drug: semaglutide;   Inject 0.5 mg under the skin every seven (7) days. HOLD until told to   resume by your coordinator; What changed: additional instructions   tacrolimus 1 MG capsule; Commonly known as: PROGRAF; Take 3 capsules (3   mg total) by mouth daily AND 2 capsules (2 mg total) nightly.; What   changed: See the new instructions.  * This list has 2 medication(s) that are the same as other medications   prescribed for you. Read the directions carefully, and ask your doctor or   other care provider to review them with you.     CONTINUE taking these medications     ACCU-CHEK GUIDE TEST STRIPS Strp; Generic drug: blood sugar diagnostic;   Use to check blood sugar as directed with insulin 3 times a day & for   symptoms of high or low blood sugar   ACCU-CHEK SOFTCLIX LANCETS Misc; Generic drug: lancets; Use to check   blood sugar as directed with insulin 3 times a day & for symptoms of high   or low blood sugar.   acetaminophen 325 MG tablet; Commonly known as: TYLENOL; Take 1-2   tablets (325-650 mg total) by mouth every six (6) hours as needed for pain   or fever.   amlodipine 10 MG tablet; Commonly known as: NORVASC; Take 1 tablet (10   mg total) by mouth daily.   aspirin 81 MG tablet; Commonly known as: ECOTRIN; Take 1 tablet (81 mg   total) by mouth daily.   BAQSIMI 3 mg/actuation Spry; Generic drug: glucagon spray; Use 1 spray   into the left nostril every fifteen (15) minutes as needed (low blood   sugar).   DULoxetine 30 MG capsule; Commonly known as: CYMBALTA; Take 2 capsules   (60 mg total) by mouth daily.   gabapentin 300 MG capsule; Commonly known as: NEURONTIN; Take 1 capsule   (300 mg total) by mouth Three (3) times a day.   hydrOXYzine 50 MG capsule; Commonly known as: VISTARIL; Take 1 capsule   (50 mg total) by mouth nightly as needed for anxiety.   melatonin 3 mg Tab; Take 1 tablet (3 mg total) by mouth every evening.   methocarbamoL 500 MG tablet; Commonly known as: ROBAXIN; Take 1 tablet   (500 mg total) by mouth Three (3) times a day as needed.   NovoLOG Flexpen U-100 Insulin 100 unit/mL (3 mL) injection pen; Generic   drug: insulin aspart; Inject under the skin per sliding scale prior to   meals. If premeal BG 151-200 take 2 additional units for correction, if   201-250 take 4 additional units, etc. Max dose 36 units per day.  Store   in-use prefilled pens at room temperature <86?F and use within 28 days; do   not refrigerate.   pantoprazole 40 MG tablet; Commonly known as: Protonix; Take 1 tablet   (40 mg total) by mouth daily as needed.   ULTICARE PEN NEEDLE 32 gauge x 5/32 (4 mm) Ndle; Generic drug: pen   needle, diabetic; Use as directed  1-2 times per day     STOP taking these medications     atorvastatin 40 MG tablet; Commonly known as: LIPITOR   atovaquone 750 mg/5 mL suspension; Commonly known as: MEPRON  magnesium (amino acid chelate) 133 mg; Generic drug: magnesium oxide-Mg   AA chelate   patiromer calcium sorbitex 16.8 gram powder packet; Commonly known as:   VELTASSA   polyethylene glycol 17 gram/dose powder; Commonly known as: MIRALAX   VELTASSA 16.8 gram powder packet; Generic drug: patiromer calcium   sorbitex       Pending Test Results:     Discharge Instructions:  Activity:     Diet:    Other Instructions:  Other Instructions       Discharge instructions      Discharge Instructions:  Activity: Resume    Diet: regular, stay well hydrated    Other: Take loperamide for a week, and then as needed daily for diarrhea. Complete the course of rifaximin for diarrhea. Call your coordinator if diarrhea does not continue to improve.  Do not take ozempic until told to resume by your coordinator, this is because we want your diarrhea to resolve before resuming ozempic. Your insulin has been adjusted since the ozempic is being held.     Your Liver Post-Transplant Coordinator is Emilio Math (see contact info at bottom of instructions). Contact your transplant coordinator or the Transplant Surgery Office [phone]) during business hours or page the transplant coordinator on call [phone]) after business hours for:    - fever >100.5 degrees F by mouth, any fever with shaking chills, or other signs or symptoms of infection   - uncontrolled nausea, vomiting, or diarrhea; inability to have a bowel movement for > 3 days.   - any problem that prevents taking medications as scheduled.   - pain uncontrolled with prescribed medication or new pain or tenderness at the surgical site   - sudden weight gain or increase in blood pressure (greater than 140/85)   - shortness of breath, chest pain / discomfort   - new or increasing jaundice   - urinary symptoms including pain / difficulty / burning or tea-colored urine   - any other new or concerning symptoms   - questions regarding your medications or continuing care    Maintain a written record of medications taken and review against the discharge medications sheet. Periodically review your Transplant Handbook for important information regarding postoperative care and required precautions.    Labs and Other Follow-ups after Discharge: resume baseline labs and frequency    Liver Post-Transplant Coordinator:  Emilio Math- phone: [phone] fax: [phone]          Labs and Other Follow-ups after Discharge:  Follow Up instructions and Outpatient Referrals     Ambulatory referral to Home Health      Is this a [facility] or [facility] Patient?: No    Physician to follow patient's care: Referring Provider    Disciplines requested: Physical Therapy    Physical Therapy requested: Evaluate and treat    Requested [facility] Date: 07/19/2022    Do you want ongoing co-management?: Yes    Care coordination required?: No    Discharge instructions          Future Appointments:  Appointments which have been scheduled for you      Jul 26, 2022 11:30 AM  (Arrive by 11:00 AM)  RETURN VIDEO HCP MYCHART with Alvester Chou, PsyD  [facility] TRANSPLANT SURGERY [place] [facility] REGION) [address]  [place] [address]  [phone]   Please sign into My [place] Chart at least 15 minutes before your appointment to complete the eCheck-In process. You must complete eCheck-In  before you can start your video visit. We also recommend testing your audio and video connection to troubleshoot any issues before your visit begins. Click ???Join Video Visit??? to complete these checks. Once you have completed eCheck-In and tested your audio and video, click ???Join Call??? to connect to your visit.     For your video visit, you will need a computer with a working camera, speaker and microphone, a smartphone, or a tablet with internet access.    My [place] Chart enables you to manage your health, send non-urgent messages to your provider, view your test results, schedule and manage appointments, and request prescription refills securely and conveniently from your computer or mobile device.    You can go to [email or web address] to sign in to your My [place] Chart account with your username and password. If you have forgotten your username or password, please choose the ???Forgot Username???? and/or ???Forgot Password???? links to gain access. You also can access your My [place] Chart account with the free MyChart mobile app for Android or iPhone.    If you need assistance accessing your My [place] Chart account or for assistance in reaching your provider's office to reschedule or cancel your appointment, please call [facility] [phone].         Jul 29, 2022 11:30 AM  (Arrive by 11:00 AM)  RETURN VIDEO HCP MYCHART with TRANSPLANT SURGERY PHARMACY  [facility] TRANSPLANT SURGERY [facility] REGION) [address]  [place] [address]  [phone]   Please sign into My [place] Chart at least 15 minutes before your appointment to complete the eCheck-In process. You must complete eCheck-In before you can start your video visit. We also recommend testing your audio and video connection to troubleshoot any issues before your visit begins. Click ???Join Video Visit??? to complete these checks. Once you have completed eCheck-In and tested your audio and video, click ???Join Call??? to connect to your visit.     For your video visit, you will need a computer with a working camera, speaker and microphone, a smartphone, or a tablet with internet access.    My [place] Chart enables you to manage your health, send non-urgent messages to your provider, view your test results, schedule and manage appointments, and request prescription refills securely and conveniently from your computer or mobile device.    You can go to [email or web address] to sign in to your My [place] Chart account with your username and password. If you have forgotten your username or password, please choose the ???Forgot Username???? and/or ???Forgot Password???? links to gain access. You also can access your My [place] Chart account with the free MyChart mobile app for Android or iPhone.    If you need assistance accessing your My [place] Chart account or for assistance in reaching your provider's office to reschedule or cancel your appointment, please call [facility] [phone].         Jul 29, 2022  1:00 PM  (Arrive by 12:30 PM)  RETURN 15 with Chirag Lanney Gins, MD  [facility] TRANSPLANT SURGERY [facility] REGION) [address]  [place] [address]  [phone]        Jul 30, 2022 10:30 AM  (Arrive by 10:00 AM)  RETURN  HEPATOLOGY with Janyth Pupa, MD  [facility] LIVER TRANSPLANT [place] [facility] REGION) [address]  [place] [address]  [phone]  Jul 31, 2022 11:30 AM  (Arrive by 11:00 AM)  RETURN VIDEO HCP MYCHART with Jackolyn Confer, RD/LDN  [facility] NUTRITION SERVICES TRANSPLANT [place] [facility] REGION) [address]  [place] [address]  [phone]   Please sign into My [place] Chart at least 15 minutes before your appointment to complete the eCheck-In process. You must complete eCheck-In before you can start your video visit. We also recommend testing your audio and video connection to troubleshoot any issues before your visit begins. Click ???Join Video Visit??? to complete these checks. Once you have completed eCheck-In and tested your audio and video, click ???Join Call??? to connect to your visit.     For your video visit, you will need a computer with a working camera, speaker and microphone, a smartphone, or a tablet with internet access.    My [place] Chart enables you to manage your health, send non-urgent messages to your provider, view your test results, schedule and manage appointments, and request prescription refills securely and conveniently from your computer or mobile device.    You can go to [email or web address] to sign in to your My [place] Chart account with your username and password. If you have forgotten your username or password, please choose the ???Forgot Username???? and/or ???Forgot Password???? links to gain access. You also can access your My [place] Chart account with the free MyChart mobile app for Android or iPhone.    If you need assistance accessing your My [place] Chart account or for assistance in reaching your provider's office to reschedule or cancel your appointment, please call [facility] [phone].         Aug 16, 2022  8:00 AM  (Arrive by 7:30 AM)  RETURN ACTIVE  with Marene Lenz, MD  [facility] ONCOLOGY MULTIDISCIPLINARY 2ND FLR CANCER HOSP [facility] REGION) [address]  [place] [address]  [phone]        Nov 05, 2022 10:30 AM  (Arrive by 10:00 AM)  RETURN  HEPATOLOGY with Janyth Pupa, MD  [facility] LIVER TRANSPLANT [place] [facility] REGION) [address]  [place] [address]  [phone]        Nov 07, 2022  2:00 PM  (Arrive by 1:30 PM)  RETURN VIDEO HCP MYCHART with TRANSPLANT SURGERY PHARMACY  [facility] TRANSPLANT SURGERY [place] [facility] REGION) [address]  [place] [address]  [phone]   Please sign into My [place] Chart at least 15 minutes before your appointment to complete the eCheck-In process. You must complete eCheck-In before you can start your video visit. We also recommend testing your audio and video connection to troubleshoot any issues before your visit begins. Click ???Join Video Visit??? to complete these checks. Once you have completed eCheck-In and tested your audio and video, click ???Join Call??? to connect to your visit.     For your video visit, you will need a computer with a working camera, speaker and microphone, a smartphone, or a tablet with internet access.    My [place] Chart enables you to manage your health, send non-urgent messages to your provider, view your test results, schedule and manage appointments, and request prescription refills securely and conveniently from your computer or mobile device.    You can go to [email or web address] to sign in to your My  Chart account with your username and password. If you have forgotten your username or password, please choose the ???Forgot Username????  and/or ???Forgot Password???? links to gain access. You also can access your My [place] Chart account with the free MyChart mobile app for Android or iPhone.    If you need assistance accessing your My [place] Chart account or for assistance in reaching your provider's office to reschedule or cancel your appointment, please call [facility] [phone].         Nov 08, 2022 10:00 AM  (Arrive by 9:30 AM)  RETURN ACTIVE [place] with Marene Lenz, MD  [facility] ONCOLOGY MULTIDISCIPLINARY 2ND FLR CANCER HOSP [facility] REGION) [address]  [place] [address]  [phone]        Jan 28, 2023  9:00 AM  (Arrive by 8:30 AM)  XR DEXA BONE DENSITY SKELETAL with Doran Durand RM 1  IM[facility] DEX[facility]) [address]  [place] [address]  [phone]   No calcium supplements 24 hrs prior.         Feb 06, 2023  8:30 AM  (Arrive by 8:00 AM)  RETURN VIDEO HCP [facility] with TRANSPLANT SURGERY PHARMACY  [facility] TRANSPLANT SURGERY [place] [facility] REGION) [address]  [place] [address]  [phone]   Please sign into My [place] Chart at least 15 minutes before your appointment to complete the eCheck-In process. You must complete eCheck-In before you can start your video visit. We also recommend testing your audio and video connection to troubleshoot any issues before your visit begins. Click ???Join Video Visit??? to complete these checks. Once you have completed eCheck-In and tested your audio and video, click ???Join Call??? to connect to your visit.     For your video visit, you will need a computer with a working camera, speaker and microphone, a smartphone, or a tablet with internet access.    My [place] Chart enables you to manage your health, send non-urgent messages to your provider, view your test results, schedule and manage appointments, and request prescription refills securely and conveniently from your computer or mobile device.    You can go to [email or web address] to sign in to your My [place] Chart account with your username and password. If you have forgotten your username or password, please choose the ???Forgot Username???? and/or ???Forgot Password???? links to gain access. You also can access your My [place] Chart account with the free MyChart mobile app for Android or iPhone.    If you need assistance accessing your My [place] Chart account or for assistance in reaching your provider's office to reschedule or cancel your appointment, please call [facility] [phone].         Feb 06, 2023 10:00 AM  (Arrive by 9:30 AM)  RETURN VIDEO HCP MYCHART with Jackolyn Confer, RD/LDN  [facility] NUTRITION SERVICES TRANSPLANT [place] [facility] REGION) [address]  [place] [address]  [phone]   Please sign into My  Chart at least 15 minutes before your appointment to complete the eCheck-In process. You must complete eCheck-In before you can start your video visit. We also recommend testing your audio and video connection to troubleshoot any issues before your visit begins. Click ???Join Video Visit??? to complete these checks. Once you have completed eCheck-In and tested your audio and video, click ???Join Call??? to connect to your visit.     For your video visit, you will need a computer with a working camera, speaker and microphone, a smartphone, or a  tablet with internet access.    My [place] Chart enables you to manage your health, send non-urgent messages to your provider, view your test results, schedule and manage appointments, and request prescription refills securely and conveniently from your computer or mobile device.    You can go to [email or web address] to sign in to your My [place] Chart account with your username and password. If you have forgotten your username or password, please choose the ???Forgot Username???? and/or ???Forgot Password???? links to gain access. You also can access your My [place] Chart account with the free MyChart mobile app for Android or iPhone.    If you need assistance accessing your My [place] Chart account or for assistance in reaching your provider's office to reschedule or cancel your appointment, please call [facility] [phone].         Feb 07, 2023  9:00 AM  (Arrive by 8:30 AM)  RETURN VIDEO HCP MYCHART with Smiley Houseman  [facility] LIVER TRANSPLANT [facility] REGION) [address]  [place] [address]  [phone]   Please sign into My [place] Chart at least 15 minutes before your appointment to complete the eCheck-In process. You must complete eCheck-In before you can start your video visit. We also recommend testing your audio and video connection to troubleshoot any issues before your visit begins. Click ???Join Video Visit??? to complete these checks. Once you have completed eCheck-In and tested your audio and video, click ???Join Call??? to connect to your visit.     For your video visit, you will need a computer with a working camera, speaker and microphone, a smartphone, or a tablet with internet access.    My [place] Chart enables you to manage your health, send non-urgent messages to your provider, view your test results, schedule and manage appointments, and request prescription refills securely and conveniently from your computer or mobile device.    You can go to [email or web address] to sign in to your My [place] Chart account with your username and password. If you have forgotten your username or password, please choose the ???Forgot Username???? and/or ???Forgot Password???? links to gain access. You also can access your My [place] Chart account with the free MyChart mobile app for Android or iPhone.    If you need assistance accessing your My [place] Chart account or for assistance in reaching your provider's office to reschedule or cancel your appointment, please call [facility] [phone].         Feb 07, 2023 10:00 AM  (Arrive by 9:30 AM)  RETURN ACTIVE [place] with Marene Lenz, MD  [facility] ONCOLOGY MULTIDISCIPLINARY 2ND FLR CANCER HOSP [facility] REGION) [address]  [place] [address]  [phone]        Feb 07, 2023 11:30 AM  (Arrive by 11:00 AM)  RETURN VIDEO HCP MYCHART with Alvester Chou, PsyD  [facility] TRANSPLANT SURGERY [place] [facility] REGION) [address]  [place] [address]  [phone]   Please sign into My  Chart at least 15 minutes before your appointment to complete the eCheck-In process. You must complete eCheck-In before you can start your video visit. We also recommend testing your audio and video connection to troubleshoot any issues before your visit begins. Click ???Join Video Visit??? to complete these checks. Once you have completed eCheck-In and tested your audio and video, click ???Join Call??? to connect to your visit.  For your video visit, you will need a computer with a working camera, speaker and microphone, a smartphone, or a tablet with internet access.    My [place] Chart enables you to manage your health, send non-urgent messages to your provider, view your test results, schedule and manage appointments, and request prescription refills securely and conveniently from your computer or mobile device.    You can go to [email or web address] to sign in to your My [place] Chart account with your username and password. If you have forgotten your username or password, please choose the ???Forgot Username???? and/or ???Forgot Password???? links to gain access. You also can access your My [place] Chart account with the free MyChart mobile app for Android or iPhone.    If you need assistance accessing your My [place] Chart account or for assistance in reaching your provider's office to reschedule or cancel your appointment, please call [facility] [phone].

## 2022-07-19 NOTE — Unmapped (Signed)
Endocrine Team Diabetes Follow Up Consult Note     Consult information:  Requesting Attending Physician : Florene Glen, MD  Service Requesting Consult : Surg Transplant [phone])  Primary Care Provider: Card, [name], MD  Impression:  Matthew Key is a 61 y.o. male admitted for diarrhea. We have been consulted at the request of Chirag Lanney Gins, MD to evaluate Matthew Key for hyperglycemia.     Medical Decision Making:  Diagnoses:  1.Type 2 Diabetes. Uncontrolled With hyperglycemia.  2. Nutrition: Complicating glycemic control. Increasing risk for both hypoglycemia and hyperglycemia.  3. Transplant. Complicating glycemic control and increasing risk for hyperglycemia.  4.  Elevated transaminases. Complicating glycemic control and increasing risk for hyperglycemia and hypoglycemia  5. History of secondary adrenal insufficiency.       Studies reviewed 07/19/22:  Labs: POCT-BG  Interpretation: Hyperglycemia.    Notes reviewed: Primary team and nursing notes      Overall impression based on above reviews and history  Hyperglycemic after po intake. Will hold off on increasing doses given hyperglycemia was after missed nutritional dose.    Recommendations:  - NPH 5u q12h  - lispro 2u qAC  - lispro target 140, ISF 30 achs  - Hypoglycemia protocol.  - POCT-BG achs.  - Ensure patient is on glucose precautions if patient taking nutrition by mouth.     Discharge planning:  Discharge back on home regimen given good control and no hypoglycemia. Will hold ozempic until diarrhea improved as per team, however will increase tresiba to 15 units in the meantime. Discharge med rec updated.    Thank you for this consult. Discussed plan with primary team. We will continue to follow and make recommendations and place orders as appropriate.    Please page with questions or concerns: Miranda Garber, NP: [phone]  DCT on call from 6AM - 3PM on weekdays then endocrine fellow on call: [phone] from 3PM - 6AM on weekdays and on weekends and holidays.   If APP cannot be reached, please page the endocrine fellow on call.      Subjective:  Interval history: NAEO. Decent po intake. Several loose BMs. Denies complaints at time of rounds. Happy to go home today.       Initial HPI:  Matthew Key is a 61 y.o. male with a PMH of  has a past medical history of AKI (acute kidney injury) (CMS-HCC) (12/14/2020), Anxiety (10/22/2013), Arthritis, Cervical radiculopathy (12/03/2016), Chronic pain disorder, Cirrhosis (CMS-HCC), Dental abscess (10/2020), Duodenal ulcer (12/01/2017), GERD (gastroesophageal reflux disease), History of transfusion, Hyperlipidemia (10/22/2013), Hypertension, Hypotension (01/29/2022), Liver disease, Sleep apnea, obstructive, Stroke (CMS-HCC), and Type 2 diabetes mellitus with diabetic neuropathy, with long-term current use of insulin (CMS-HCC) (06/09/2014). admitted on 07/10/2022 for an after weekly post transplant labs revealed elevated transaminases and alk phos with persistent diarrhea for the last several weeks without improvement.     Diabetes History:  Patient has a history of Type 2 diabetes diagnosed 2017.  Diabetes is managed by: PCP.  Current home diabetes regimen: tresiba 13 units at bedtime, novolog correction and ozempic 1mg  qweek (Sundays, last dose 11/26).    Current home blood glucose monitoring:  3-4 times a day.  Hypoglycemia awareness: yes.  Complications related to diabetes: peripheral neuropathy      Endocrine History: History of secondary adrenal insufficiency. Had been receiving steroids steroids for spinal injections. We saw back in January of this year and noted HPA axis recovery based on ACTH stim test (cortisol went from 8.4->  22.9). Since then he was prescribed hydrocortisone 10mg  every day prn illness, but states he hasn't taken any. Has not taken any steroids this calendar year (2023).    Current Nutrition:  Active Orders   Diet    Nutrition Therapy Regular/House       ROS: As per HPI. amlodipine  10 mg Oral Daily    aspirin  81 mg Oral Daily    DULoxetine  60 mg Oral Daily    everolimus (immunosuppressive)  1 mg Oral Nightly    everolimus  2 mg Oral daily    gabapentin  300 mg Oral TID    heparin (porcine) for subcutaneous use  5,000 Units Subcutaneous Q8H SCH    insulin lispro  0-20 Units Subcutaneous ACHS    insulin lispro  2 Units Subcutaneous 3xd Meals    insulin NPH  5 Units Subcutaneous Q12H    loperamide  2 mg Oral TID    magnesium sulfate  2 g Intravenous Daily    melatonin  3 mg Oral Nightly    psyllium  1 packet Oral TID    rifAXIMin  550 mg Oral TID    tacrolimus  2 mg Oral Nightly    tacrolimus  3 mg Oral Daily    ursodioL  300 mg Oral TID       Current Outpatient Medications   Medication Instructions    acetaminophen (TYLENOL) 325-650 mg, Oral, Every 6 hours PRN    amlodipine (NORVASC) 10 mg, Oral, Daily (standard)    aspirin (ECOTRIN) 81 mg, Oral, Daily (standard)    atorvastatin (LIPITOR) 40 mg, Oral, Daily (standard), Hold until directed to take by nurse coordinator    atovaquone (MEPRON) 1,500 mg, Oral, Daily (standard)    blood sugar diagnostic (ACCU-CHEK GUIDE TEST STRIPS) Strp Use to check blood sugar as directed with insulin 3 times a day & for symptoms of high or low blood sugar    blood-glucose meter kit Use as instructed    blood-glucose meter kit Use as instructed    blood-glucose meter kit Use as instructed    blood-glucose meter Misc Use to test blood sugar as directed    DULoxetine (CYMBALTA) 60 mg, Oral, Daily (standard)    everolimus, immunosuppressive, (ZORTRESS) 0.5 mg tablet Take 4 tablets (2 mg total) by mouth daily AND 2 tablets (1 mg total) nightly.    gabapentin (NEURONTIN) 300 mg, Oral, 3 times a day (standard)    glucagon spray 3 mg/actuation Spry Use 1 spray into the left nostril every fifteen (15) minutes as needed (low blood sugar).    hydrOXYzine (VISTARIL) 50 mg, Oral, Nightly PRN    insulin ASPART (NOVOLOG FLEXPEN) 100 unit/mL (3 mL) injection pen Inject under the skin per sliding scale prior to meals. If premeal BG 151-200 take 2 additional units for correction, if 201-250 take 4 additional units, etc. Max dose 36 units per day.  Store in-use prefilled pens at room temperature <86??F and use within 28 days; do not refrigerate.    lancets (ACCU-CHEK SOFTCLIX LANCETS) Misc Use to check blood sugar as directed with insulin 3 times a day & for symptoms of high or low blood sugar.    magnesium oxide-Mg AA chelate (MAGNESIUM, AMINO ACID CHELATE,) 133 mg 2 tablets, Oral, 2 times a day    melatonin 3 mg, Oral, Every evening    methocarbamoL (ROBAXIN) 500 mg, Oral, 3 times a day PRN    OZEMPIC 0.5 mg, Subcutaneous, Every 7 days  pantoprazole (PROTONIX) 40 mg, Oral, Daily PRN    patiromer calcium sorbitex (VELTASSA) 16.8 g, Oral, 2 times a day    pen needle, diabetic (BD ULTRA-FINE NANO PEN NEEDLE) 32 gauge x 5/32 (4 mm) Ndle Use as directed  1-2 times per day    polyethylene glycol (MIRALAX) 17 gram/dose powder Mix 17 g (use measure line in cap) in 4-8 ounces of water, juice, soda, coffee, or tea and take by mouth daily as needed.    psyllium (METAMUCIL) 3.4 gram packet 1 packet, Oral, 2 times a day (standard)    rifAXIMin (XIFAXAN) 550 mg, Oral, 3 times a day (standard)    tacrolimus (PROGRAF) 1 MG capsule Take 3 capsules (3 mg total) by mouth daily AND 2 capsules (2 mg total) nightly.    TRESIBA FLEXTOUCH U-100 13 Units, Subcutaneous, Daily (standard), Adjust as instructed.    VELTASSA 16.8 g, Oral, Daily (standard)           Past Medical History:   Diagnosis Date    AKI (acute kidney injury) (CMS-HCC) 12/14/2020    Anxiety 10/22/2013    Arthritis     Cervical radiculopathy 12/03/2016    Chronic pain disorder     Lower back    Cirrhosis (CMS-HCC)     Dental abscess 10/2020    Duodenal ulcer 12/01/2017    GERD (gastroesophageal reflux disease)     History of transfusion     Hyperlipidemia 10/22/2013    Hypertension     under control with meds and weight loss Hypotension 01/29/2022    Liver disease     Sleep apnea, obstructive     Have machine    Stroke (CMS-HCC)     mild stroke    Type 2 diabetes mellitus with diabetic neuropathy, with long-term current use of insulin (CMS-HCC) 06/09/2014       Past Surgical History:   Procedure Laterality Date    KNEE SURGERY      PR CATH PLACE/CORON ANGIO, IMG SUPER/INTERP,R&L HRT CATH, L HRT VENTRIC N/A 12/19/2020    Procedure: CATH LEFT/RIGHT HEART CATHETERIZATION;  Surgeon: Rosana Hoes, MD;  [facility] CATH;  Service: Cardiology    PR COLONOSCOPY FLX DX W/COLLJ SPEC WHEN PFRMD N/A 12/07/2021    Procedure: COLONOSCOPY, FLEXIBLE, PROXIMAL TO SPLENIC FLEXURE; DIAGNOSTIC, W/WO COLLECTION SPECIMEN BY BRUSH OR WASH;  Surgeon: Annie Paras, MD;  Location: GI PROCEDURES [facility];  Service: Gastroenterology    PR COLONOSCOPY W/BIOPSY SINGLE/MULTIPLE N/A 07/15/2022    Procedure: COLONOSCOPY, FLEXIBLE, PROXIMAL TO SPLENIC FLEXURE; WITH BIOPSY, SINGLE OR MULTIPLE;  Surgeon: Leland Her, MD;  Location: GI PROCEDURES [facility];  Service: Gastroenterology    PR TRANSPLANT LIVER,ALLOTRANSPLANT N/A 02/06/2022    Procedure: LIVER ALLOTRANSPLANTATION; ORTHOTOPIC, PARTIAL OR WHOLE, FROM CADAVER OR LIVING DONOR, ANY AGE;  Surgeon: Florene Glen, MD;  Location: MAIN OR [place];  Service: Transplant    PR TRANSPLANT,PREP DONOR LIVER/ARTERIAL N/A 02/06/2022    Procedure: BACKBNCH RECONSTRUCT OF CAD/LIVE DONOR LIVER GFT PRIOR TRANSPLANT; ARTERIAL ANASTAMOSIS, EA;  Surgeon: Florene Glen, MD;  Location: MAIN OR ;  Service: Transplant    PR UPPER GI ENDOSCOPY,BIOPSY N/A 10/12/2020    Procedure: UGI ENDOSCOPY; WITH BIOPSY, SINGLE OR MULTIPLE;  Surgeon: Marene Lenz, MD;  Location: GI PROCEDURES MEMOR[facility];  Service: Gastroenterology    PR UPPER GI ENDOSCOPY,DIAGNOSIS N/A 01/03/2022    Procedure: UGI ENDO, INCLUDE ESOPHAGUS, STOMACH, & DUODENUM &/OR JEJUNUM; DX W/WO COLLECTION SPECIMN, BY BRUSH OR WASH;  Surgeon: Marene Lenz, MD;  Location: GI PROCEDURES [facility];  Service: Gastroenterology    PR UPPER GI ENDOSCOPY,LIGAT VARIX N/A 12/07/2021    Procedure: UGI ENDO; Everlene Balls LIG ESOPH &/OR GASTRIC VARICES;  Surgeon: Annie Paras, MD;  Location: GI PROCEDURES [facility];  Service: Gastroenterology    ROOT CANAL      Front teeth       Family History   Problem Relation Age of Onset    Edema Mother     Alzheimer's disease Father     Aortic dissection Brother     Early death Brother     Aneurysm Brother        Social History     Tobacco Use    Smoking status: Never    Smokeless tobacco: Never   Vaping Use    Vaping Use: Never used   Substance Use Topics    Alcohol use: Not Currently    Drug use: Not Currently       OBJECTIVE:  BP 133/78  - Pulse 65  - Temp 36.8 ??C (98.2 ??F) (Oral)  - Resp 18  - Ht 172.7 cm (5' 8)  - Wt 86 kg (189 lb 9.6 oz)  - SpO2 95%  - BMI 28.83 kg/m??   Wt Readings from Last 12 Encounters:   07/18/22 86 kg (189 lb 9.6 oz)   05/06/22 87.5 kg (193 lb)   04/08/22 86.7 kg (191 lb 1.6 oz)   04/08/22 86.7 kg (191 lb 1.6 oz)   03/11/22 86 kg (189 lb 11.2 oz)   02/25/22 88.6 kg (195 lb 4.8 oz)   02/25/22 88.6 kg (195 lb 4.8 oz)   02/18/22 96.3 kg (212 lb 3.2 oz)   02/11/22 94.8 kg (209 lb)   01/29/22 96.9 kg (213 lb 11.2 oz)   01/14/22 89.8 kg (198 lb)   01/03/22 88.9 kg (196 lb)     Physical Exam  Constitutional:       Appearance: Normal appearance.   Pulmonary:      Effort: Pulmonary effort is normal. No respiratory distress.   Skin:     General: Skin is warm and dry.   Neurological:      General: No focal deficit present.      Mental Status: He is alert and oriented to person, place, and time.             BG/insulin reviewed per EMR.   Glucose, POC (mg/dL)   Date Value   16/05/9603 188 (H)   07/18/2022 112   07/18/2022 229 (H)   07/18/2022 261 (H)   07/18/2022 124   07/18/2022 144   07/17/2022 125   07/17/2022 71        Summary of labs:  Lab Results   Component Value Date    A1C 6.5 (H) 05/06/2022    A1C 5.2 02/05/2022    A1C 4.7 (L) 01/14/2022     Lab Results   Component Value Date    CREATININE 1.11 07/19/2022     Lab Results   Component Value Date    WBC 6.0 07/19/2022    HGB 11.6 (L) 07/19/2022    HCT 34.3 (L) 07/19/2022    PLT 137 (L) 07/19/2022       Lab Results   Component Value Date    NA 140 07/19/2022    K 4.1 07/19/2022    CL 106 07/19/2022    CO2 29.0 07/19/2022    BUN 12 07/19/2022  CREATININE 1.11 07/19/2022    GLU 105 07/19/2022    CALCIUM 9.4 07/19/2022    MG 1.9 07/19/2022    PHOS 4.4 07/19/2022       Lab Results   Component Value Date    BILITOT 0.3 07/19/2022    BILIDIR 0.10 07/19/2022    PROT 5.9 07/19/2022    ALBUMIN 3.1 (L) 07/19/2022    ALT 83 (H) 07/19/2022    AST 22 07/19/2022    ALKPHOS 241 (H) 07/19/2022    GGT 264 (H) 07/19/2022

## 2022-07-19 NOTE — Unmapped (Signed)
Reviewed plan of care with patient who indicates understanding. Pt has no questions or concerns at this time. Will continue to monitor.    Intake/Output:  - BM:  having multiple loose BM's near beginning of shift, but got less during the night    Safety/precautions:   - Falls precautions maintained  - Pt remained free of falls and injury this shift    Activity:  - up ad lib independent    Miscellaneous  - plan for possible discharge today     Problem: Adult Inpatient Plan of Care  Goal: Plan of Care Review  Outcome: Progressing  Flowsheets (Taken 07/19/2022 0315)  Plan of Care Reviewed With: patient  Goal: Patient-Specific Goal (Individualized)  Outcome: Progressing  Goal: Absence of Hospital-Acquired Illness or Injury  Outcome: Progressing  Intervention: Identify and Manage Fall Risk  Recent Flowsheet Documentation  Taken 07/19/2022 0200 by Robert Bellow, RN  Safety Interventions:   low bed   fall reduction program maintained   nonskid shoes/slippers when out of bed  Taken 07/18/2022 2345 by Robert Bellow, RN  Safety Interventions:   low bed   fall reduction program maintained   nonskid shoes/slippers when out of bed  Taken 07/18/2022 2143 by Robert Bellow, RN  Safety Interventions:   low bed   fall reduction program maintained   nonskid shoes/slippers when out of bed  Taken 07/18/2022 2010 by Robert Bellow, RN  Safety Interventions:   low bed   fall reduction program maintained   nonskid shoes/slippers when out of bed  Intervention: Prevent Skin Injury  Recent Flowsheet Documentation  Taken 07/19/2022 0200 by Robert Bellow, RN  Positioning for Skin: Right  Device Skin Pressure Protection: absorbent pad utilized/changed  Skin Protection: adhesive use limited  Taken 07/18/2022 2345 by Robert Bellow, RN  Positioning for Skin: Left  Device Skin Pressure Protection: absorbent pad utilized/changed  Skin Protection: adhesive use limited  Taken 07/18/2022 2143 by Robert Bellow, RN  Positioning for Skin: Supine/Back  Device Skin Pressure Protection: absorbent pad utilized/changed  Skin Protection: adhesive use limited  Taken 07/18/2022 2010 by Robert Bellow, RN  Positioning for Skin: Left  Device Skin Pressure Protection: absorbent pad utilized/changed  Skin Protection: adhesive use limited  Intervention: Prevent and Manage VTE (Venous Thromboembolism) Risk  Recent Flowsheet Documentation  Taken 07/19/2022 0200 by Robert Bellow, RN  Anti-Embolism Device Type: SCD, Knee  Anti-Embolism Intervention: Refused  Anti-Embolism Device Location: BLE  Taken 07/18/2022 2345 by Robert Bellow, RN  Anti-Embolism Device Type: SCD, Knee  Anti-Embolism Intervention: Refused  Anti-Embolism Device Location: BLE  Taken 07/18/2022 2143 by Robert Bellow, RN  Anti-Embolism Device Type: SCD, Knee  Anti-Embolism Intervention: Refused  Anti-Embolism Device Location: BLE  Taken 07/18/2022 2010 by Robert Bellow, RN  Anti-Embolism Device Type: SCD, Knee  Anti-Embolism Intervention: Refused  Anti-Embolism Device Location: BLE  Goal: Optimal Comfort and Wellbeing  Outcome: Progressing  Goal: Readiness for Transition of Care  Outcome: Progressing  Goal: Rounds/Family Conference  Outcome: Progressing     Problem: Infection  Goal: Absence of Infection Signs and Symptoms  Outcome: Progressing  Intervention: Prevent or Manage Infection  Recent Flowsheet Documentation  Taken 07/18/2022 2010 by Robert Bellow, RN  Isolation Precautions: protective precautions maintained     Problem: Fall Injury Risk  Goal: Absence of Fall and Fall-Related Injury  Outcome: Progressing  Intervention: Promote Scientist, clinical (histocompatibility and immunogenetics) Documentation  Taken 07/19/2022 0200 by [employer], Lucresha Dismuke  E, RN  Safety Interventions:   low bed   fall reduction program maintained   nonskid shoes/slippers when out of bed  Taken 07/18/2022 2345 by Robert Bellow, RN  Safety Interventions:   low bed   fall reduction program maintained   nonskid shoes/slippers when out of bed  Taken 07/18/2022 2143 by Robert Bellow, RN  Safety Interventions:   low bed   fall reduction program maintained   nonskid shoes/slippers when out of bed  Taken 07/18/2022 2010 by Robert Bellow, RN  Safety Interventions:   low bed   fall reduction program maintained   nonskid shoes/slippers when out of bed

## 2022-07-20 NOTE — Unmapped (Signed)
Called pt to check in post hospital discharge per Dr. Nicky Pugh request. Left pt and his wife detailed vms requesting to page on-call TNC to report any symptoms/issues.

## 2022-07-20 NOTE — Unmapped (Signed)
Alert & oriented x4, vital signs stable on room air  Patient endorses no pain  Patient ambulates independently to toilet, no falls at this time  No bowel movements, tolerating a regular diet  Patient medications delivered to bedside, discharge teaching provided, pt verbalizes understanding and has no questions or concerns at this time    Problem: Adult Inpatient Plan of Care  Goal: Plan of Care Review  07/19/2022 1656 by Cynda Acres, RN  Outcome: Discharged to Home  07/19/2022 1655 by Cynda Acres, RN  Outcome: Resolved  Goal: Patient-Specific Goal (Individualized)  07/19/2022 1656 by Cynda Acres, RN  Outcome: Discharged to Home  07/19/2022 1655 by Cynda Acres, RN  Outcome: Resolved  Goal: Absence of Hospital-Acquired Illness or Injury  07/19/2022 1656 by Cynda Acres, RN  Outcome: Discharged to Home  07/19/2022 1655 by Cynda Acres, RN  Outcome: Resolved  Intervention: Identify and Manage Fall Risk  Recent Flowsheet Documentation  Taken 07/19/2022 0800 by Cynda Acres, RN  Safety Interventions:   fall reduction program maintained   lighting adjusted for tasks/safety   low bed   nonskid shoes/slippers when out of bed  Intervention: Prevent Skin Injury  Recent Flowsheet Documentation  Taken 07/19/2022 0800 by Cynda Acres, RN  Positioning for Skin: Supine/Back  Device Skin Pressure Protection:   absorbent pad utilized/changed   adhesive use limited  Skin Protection: adhesive use limited  Intervention: Prevent and Manage VTE (Venous Thromboembolism) Risk  Recent Flowsheet Documentation  Taken 07/19/2022 1400 by Cynda Acres, RN  Anti-Embolism Device Type: SCD, Knee  Anti-Embolism Intervention: Refused  Anti-Embolism Device Location: BLE  Taken 07/19/2022 1200 by Cynda Acres, RN  Anti-Embolism Device Type: SCD, Knee  Anti-Embolism Intervention: Refused  Anti-Embolism Device Location: BLE  Taken 07/19/2022 1000 by Cynda Acres, RN  Anti-Embolism Device Type: SCD, Knee  Anti-Embolism Intervention: Refused  Anti-Embolism Device Location: BLE  Taken 07/19/2022 1610 by Cynda Acres, RN  Anti-Embolism Device Type: SCD, Knee  Anti-Embolism Intervention: Refused  Anti-Embolism Device Location: BLE  Taken 07/19/2022 0800 by Cynda Acres, RN  Anti-Embolism Device Type: SCD, Knee  Anti-Embolism Intervention: Refused  Anti-Embolism Device Location: BLE  Intervention: Prevent Infection  Recent Flowsheet Documentation  Taken 07/19/2022 0800 by Cynda Acres, RN  Infection Prevention:   cohorting utilized   environmental surveillance performed   equipment surfaces disinfected   hand hygiene promoted   personal protective equipment utilized   rest/sleep promoted   single patient room provided  Goal: Optimal Comfort and Wellbeing  07/19/2022 1656 by Cynda Acres, RN  Outcome: Discharged to Home  07/19/2022 1655 by Cynda Acres, RN  Outcome: Resolved  Goal: Readiness for Transition of Care  07/19/2022 1656 by Cynda Acres, RN  Outcome: Discharged to Home  07/19/2022 1655 by Cynda Acres, RN  Outcome: Resolved  Goal: Rounds/Family Conference  07/19/2022 1656 by Cynda Acres, RN  Outcome: Discharged to Home  07/19/2022 1655 by Cynda Acres, RN  Outcome: Resolved     Problem: Infection  Goal: Absence of Infection Signs and Symptoms  07/19/2022 1656 by Cynda Acres, RN  Outcome: Discharged to Home  07/19/2022 1655 by Cynda Acres, RN  Outcome: Resolved  Intervention: Prevent or Manage Infection  Recent Flowsheet Documentation  Taken 07/19/2022 0800 by Cynda Acres, RN  Infection Management: aseptic technique maintained  Isolation Precautions: protective precautions maintained     Problem: Fall Injury Risk  Goal: Absence of Fall and Fall-Related Injury  07/19/2022 1656 by Cynda Acres, RN  Outcome: Discharged to Home  07/19/2022 1655 by Cynda Acres, RN  Outcome: Resolved  Intervention: Promote Injury-Free Environment  Recent Flowsheet Documentation  Taken 07/19/2022 0800 by Cynda Acres, RN  Safety Interventions:   fall reduction program maintained   lighting adjusted for tasks/safety   low bed   nonskid shoes/slippers when out of bed

## 2022-07-22 DIAGNOSIS — E612 Magnesium deficiency: Principal | ICD-10-CM

## 2022-07-22 DIAGNOSIS — Z5181 Encounter for therapeutic drug level monitoring: Principal | ICD-10-CM

## 2022-07-22 DIAGNOSIS — Z944 Liver transplant status: Principal | ICD-10-CM

## 2022-07-24 DIAGNOSIS — E612 Magnesium deficiency: Principal | ICD-10-CM

## 2022-07-24 DIAGNOSIS — Z5181 Encounter for therapeutic drug level monitoring: Principal | ICD-10-CM

## 2022-07-24 DIAGNOSIS — Z944 Liver transplant status: Principal | ICD-10-CM

## 2022-07-24 DIAGNOSIS — R748 Abnormal levels of other serum enzymes: Principal | ICD-10-CM

## 2022-07-24 LAB — COMPREHENSIVE METABOLIC PANEL
A/G RATIO: 1.8 (ref 1.2–2.2)
ALBUMIN: 3.9 g/dL (ref 3.9–4.9)
ALKALINE PHOSPHATASE: 363 IU/L — ABNORMAL HIGH (ref 44–121)
ALT (SGPT): 252 IU/L — ABNORMAL HIGH (ref 0–44)
AST (SGOT): 53 IU/L — ABNORMAL HIGH (ref 0–40)
BILIRUBIN TOTAL (MG/DL) IN SER/PLAS: 0.3 mg/dL (ref 0.0–1.2)
BLOOD UREA NITROGEN: 15 mg/dL (ref 8–27)
BUN / CREAT RATIO: 12 (ref 10–24)
CALCIUM: 9.7 mg/dL (ref 8.6–10.2)
CHLORIDE: 101 mmol/L (ref 96–106)
CO2: 23 mmol/L (ref 20–29)
CREATININE: 1.27 mg/dL (ref 0.76–1.27)
GLOBULIN, TOTAL: 2.2 g/dL (ref 1.5–4.5)
GLUCOSE: 134 mg/dL — ABNORMAL HIGH (ref 70–99)
POTASSIUM: 4.5 mmol/L (ref 3.5–5.2)
SODIUM: 139 mmol/L (ref 134–144)
TOTAL PROTEIN: 6.1 g/dL (ref 6.0–8.5)

## 2022-07-24 LAB — BILIRUBIN, DIRECT: BILIRUBIN DIRECT: 0.12 mg/dL (ref 0.00–0.40)

## 2022-07-24 LAB — MAGNESIUM: MAGNESIUM: 1.4 mg/dL — ABNORMAL LOW (ref 1.6–2.3)

## 2022-07-24 LAB — GAMMA GT: GAMMA GLUTAMYL TRANSFERASE: 290 IU/L — ABNORMAL HIGH (ref 0–65)

## 2022-07-24 LAB — PHOSPHORUS: PHOSPHORUS, SERUM: 4.8 mg/dL — ABNORMAL HIGH (ref 2.8–4.1)

## 2022-07-24 NOTE — Unmapped (Signed)
Patient's 12/5 liver biopsy reviewed today in pathology conference by Dr. Arlyss Gandy in the presence of Drs. Celine Mans, S.Kapoor, and Dr.Shah. Per Dr.Iuga, no evidence of ACR, but some focal bile duct injury/mild lobular hepatitis. Staining for EBV and CMV were both negative.    Per Dr.Desai, given bump again yesterday in LFTs will have patient repeat labs again tomorrow and obtain MRCP.

## 2022-07-24 NOTE — Unmapped (Signed)
Contacted patient after Pathology conference discussion and recommendation from Dr.Desai for repeat labs this week/MRCP and relayed all to patient. He reports his diarrhea is controlled with imodium and he denies any other sx or the use of any supplements or new medications. Let him know order was placed for MRI on Sun morning and provided prep instructions. He verbalized understanding. PA request sent to fin'l navigator.

## 2022-07-25 LAB — CBC W/ DIFFERENTIAL
BASOPHILS ABSOLUTE COUNT: 0 10*3/uL (ref 0.0–0.2)
BASOPHILS RELATIVE PERCENT: 0 %
EOSINOPHILS ABSOLUTE COUNT: 0.1 10*3/uL (ref 0.0–0.4)
EOSINOPHILS RELATIVE PERCENT: 2 %
HEMATOCRIT: 36.2 % — ABNORMAL LOW (ref 37.5–51.0)
HEMOGLOBIN: 12 g/dL — ABNORMAL LOW (ref 13.0–17.7)
LYMPHOCYTES ABSOLUTE COUNT: 1.8 10*3/uL (ref 0.7–3.1)
LYMPHOCYTES RELATIVE PERCENT: 25 %
MEAN CORPUSCULAR HEMOGLOBIN CONC: 33.1 g/dL (ref 31.5–35.7)
MEAN CORPUSCULAR HEMOGLOBIN: 26.5 pg — ABNORMAL LOW (ref 26.6–33.0)
MEAN CORPUSCULAR VOLUME: 80 fL (ref 79–97)
MONOCYTES ABSOLUTE COUNT: 0.4 10*3/uL (ref 0.1–0.9)
MONOCYTES RELATIVE PERCENT: 6 %
NEUTROPHILS ABSOLUTE COUNT: 4.6 10*3/uL (ref 1.4–7.0)
NEUTROPHILS RELATIVE PERCENT: 61 %
PLATELET COUNT: 210 10*3/uL (ref 150–450)
RED BLOOD CELL COUNT: 4.52 x10E6/uL (ref 4.14–5.80)
RED CELL DISTRIBUTION WIDTH: 12.9 % (ref 11.6–15.4)
WHITE BLOOD CELL COUNT: 7.3 10*3/uL (ref 3.4–10.8)

## 2022-07-25 LAB — TACROLIMUS LEVEL: TACROLIMUS BLOOD: 4.1 ng/mL (ref 2.0–20.0)

## 2022-07-25 LAB — IMMATURE CELLS
BANDS: 2 %
METAMYLOCYTES-LABCORP: 4 % — ABNORMAL HIGH (ref 0–0)

## 2022-07-26 LAB — CBC W/ DIFFERENTIAL
BASOPHILS ABSOLUTE COUNT: 0 10*3/uL (ref 0.0–0.2)
BASOPHILS RELATIVE PERCENT: 0 %
EOSINOPHILS ABSOLUTE COUNT: 0.1 10*3/uL (ref 0.0–0.4)
EOSINOPHILS RELATIVE PERCENT: 2 %
HEMATOCRIT: 37 % — ABNORMAL LOW (ref 37.5–51.0)
HEMOGLOBIN: 12.3 g/dL — ABNORMAL LOW (ref 13.0–17.7)
LYMPHOCYTES ABSOLUTE COUNT: 1.9 10*3/uL (ref 0.7–3.1)
LYMPHOCYTES RELATIVE PERCENT: 27 %
MEAN CORPUSCULAR HEMOGLOBIN CONC: 33.2 g/dL (ref 31.5–35.7)
MEAN CORPUSCULAR HEMOGLOBIN: 26.2 pg — ABNORMAL LOW (ref 26.6–33.0)
MEAN CORPUSCULAR VOLUME: 79 fL (ref 79–97)
MONOCYTES ABSOLUTE COUNT: 0.7 10*3/uL (ref 0.1–0.9)
MONOCYTES RELATIVE PERCENT: 10 %
NEUTROPHILS ABSOLUTE COUNT: 4.4 10*3/uL (ref 1.4–7.0)
NEUTROPHILS RELATIVE PERCENT: 61 %
PLATELET COUNT: 224 10*3/uL (ref 150–450)
RED BLOOD CELL COUNT: 4.69 x10E6/uL (ref 4.14–5.80)
RED CELL DISTRIBUTION WIDTH: 13 % (ref 11.6–15.4)
WHITE BLOOD CELL COUNT: 7.2 10*3/uL (ref 3.4–10.8)

## 2022-07-26 LAB — COMPREHENSIVE METABOLIC PANEL
A/G RATIO: 1.5 (ref 1.2–2.2)
ALBUMIN: 4.1 g/dL (ref 3.9–4.9)
ALKALINE PHOSPHATASE: 347 IU/L — ABNORMAL HIGH (ref 44–121)
ALT (SGPT): 176 IU/L — ABNORMAL HIGH (ref 0–44)
AST (SGOT): 79 IU/L — ABNORMAL HIGH (ref 0–40)
BILIRUBIN TOTAL (MG/DL) IN SER/PLAS: 0.5 mg/dL (ref 0.0–1.2)
BLOOD UREA NITROGEN: 17 mg/dL (ref 8–27)
BUN / CREAT RATIO: 13 (ref 10–24)
CALCIUM: 9.7 mg/dL (ref 8.6–10.2)
CHLORIDE: 99 mmol/L (ref 96–106)
CO2: 25 mmol/L (ref 20–29)
CREATININE: 1.33 mg/dL — ABNORMAL HIGH (ref 0.76–1.27)
GLOBULIN, TOTAL: 2.7 g/dL (ref 1.5–4.5)
GLUCOSE: 130 mg/dL — ABNORMAL HIGH (ref 70–99)
POTASSIUM: 5.1 mmol/L (ref 3.5–5.2)
SODIUM: 138 mmol/L (ref 134–144)
TOTAL PROTEIN: 6.8 g/dL (ref 6.0–8.5)

## 2022-07-26 LAB — PHOSPHORUS: PHOSPHORUS, SERUM: 5.2 mg/dL — ABNORMAL HIGH (ref 2.8–4.1)

## 2022-07-26 LAB — EVEROLIMUS: EVEROLIMUS LEVEL: 3.1 ng/mL (ref 3.0–8.0)

## 2022-07-26 LAB — BILIRUBIN, DIRECT: BILIRUBIN DIRECT: 0.17 mg/dL (ref 0.00–0.40)

## 2022-07-26 LAB — GAMMA GT: GAMMA GLUTAMYL TRANSFERASE: 285 IU/L — ABNORMAL HIGH (ref 0–65)

## 2022-07-26 LAB — MAGNESIUM: MAGNESIUM: 1.4 mg/dL — ABNORMAL LOW (ref 1.6–2.3)

## 2022-07-26 NOTE — Unmapped (Signed)
POST-TRANSPLANT PSYCHOLOGICAL EVALUATION    Patient Name: Matthew Key  Medical Record Number: [id number]  Date of Service: July 26, 2022  Clinical Psychologist: Deedra Ehrich, PsyD  Evaluation Duration and Procedures:  20 minute telephone Clinical interview; record review; case consultation;   Procedure Code(s): 657 [id number]      The patient reports they are physically located in [place] and is currently: at home. I conducted a phone visit.  I spent 20 minutes on the phone call with the patient on the date of service .     The patient was not located and I was located within 250 yards of a hospital-based location during the phone.    This evaluation note may contain sensitive and confidential information regarding the patient???s psychosocial adjustment to living with a chronic medical condition. DO NOT share this information outside [facility] without written consent from the patient explicitly stating that mental health records may be released.     The limits of confidentiality and the purpose of the evaluation were reviewed. The patient was provided with a verbal description of the nature and purpose of the psychological evaluation. I also reviewed the referral source, specific referral question for this evaluation, foreseeable risks/discomforts, benefits, limits of confidentiality, and mandatory reporting requirements of this provider. The patient was given the opportunity to ask questions and receive answers about the present evaluation. Oral consent was provided by the patient.     BACKGROUND INFORMATION: Mr.  Matthew Key was seen for a post-transplant psychological evaluation. He is a 61 y.o. married Caucasian male from [place], [place]. 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     BEHAVIORAL OBSERVATIONS:   [name] arrived for his appointment on time. He was interviewed alone. Rapport was easily established. He did not seem motivated to present  himself in an overly favorable light.      MENTAL STATUS EXAM:  Appearance: unable to assess  Motor: unable to assess  Speech/Language: Normal rate, volume, tone, fluency  Mood: good  Affect: Calm and Cooperative  Thought Process: Logical, linear, clear, coherent, goal directed  Thought Content: Denies SI, HI, self harm, delusions, obsessions, paranoid ideation, or ideas of reference  Perceptual Disturbances: Denies auditory and visual hallucinations, behavior not concerning for response to internal stimuli  Orientation: Oriented to person, place, time, and general circumstances  Attention: Able to fully attend without fluctuations in consciousness  Concentration: Able to fully concentrate and attend  Memory: Immediate, short-term, long-term, and recall grossly intact  Fund of Knowledge: Consistent with level of education and development  Insight: Intact  Judgment: Intact  Impulse Control: Intact    INTERVIEW:    Pt reported he was recently discharged from the hospital after nine days. He has a follow up MRI scheduled Sunday and appts with transplant team members on Monday. He noted feeling less anxious in his recovery despite some complications. He stated he has reflected on his situation and has improved his ability to distinguish between what he can control and what he cannot control.     He denied need for ongoing support at this time and stated he would reach out for support if needed.     INTERVENTION: health and behavior assessment    PSYCHIATRIC DIAGNOSES:   Adjustment Disorder, resolved                             IMPRESSIONS, RECOMMENDATIONS, AND PLAN:   Pt reported doing well today. He has  experienced some complications post-transplant that have required hospitalization. He continued to adhere to medical recommendations. He appears to be coping with post-transplant life well.     Should the patient or treatment team notice a change in functioning, please refer back to transplant psychology for further evaluation and treatment.        Recommendations discussed with patient? yes  Agreed upon by patient? yes

## 2022-07-28 ENCOUNTER — Ambulatory Visit: Admit: 2022-07-28 | Discharge: 2022-07-29 | Payer: MEDICARE

## 2022-07-28 LAB — EVEROLIMUS: EVEROLIMUS LEVEL: 3.5 ng/mL (ref 3.0–8.0)

## 2022-07-28 LAB — TACROLIMUS LEVEL: TACROLIMUS BLOOD: 4.7 ng/mL (ref 2.0–20.0)

## 2022-07-28 MED ADMIN — gadobenate dimeglumine (MULTIHANCE) 529 mg/mL (0.1mmol/0.2mL) solution 10 mL: 10 mL | INTRAVENOUS | @ 16:00:00 | Stop: 2022-07-28

## 2022-07-29 ENCOUNTER — Ambulatory Visit: Admit: 2022-07-29 | Discharge: 2022-07-29 | Payer: MEDICARE

## 2022-07-29 ENCOUNTER — Telehealth: Admit: 2022-07-29 | Discharge: 2022-07-29 | Payer: MEDICARE

## 2022-07-29 DIAGNOSIS — Z5181 Encounter for therapeutic drug level monitoring: Principal | ICD-10-CM

## 2022-07-29 DIAGNOSIS — Z944 Liver transplant status: Principal | ICD-10-CM

## 2022-07-29 DIAGNOSIS — R748 Abnormal levels of other serum enzymes: Principal | ICD-10-CM

## 2022-07-29 DIAGNOSIS — E612 Magnesium deficiency: Principal | ICD-10-CM

## 2022-07-29 DIAGNOSIS — K831 Obstruction of bile duct: Principal | ICD-10-CM

## 2022-07-29 LAB — SLIDE REVIEW

## 2022-07-29 LAB — COMPREHENSIVE METABOLIC PANEL
ALBUMIN: 4 g/dL (ref 3.4–5.0)
ALKALINE PHOSPHATASE: 275 U/L — ABNORMAL HIGH (ref 46–116)
ALT (SGPT): 73 U/L — ABNORMAL HIGH (ref 10–49)
ANION GAP: 10 mmol/L (ref 5–14)
AST (SGOT): 29 U/L (ref <=34)
BILIRUBIN TOTAL: 0.4 mg/dL (ref 0.3–1.2)
BLOOD UREA NITROGEN: 13 mg/dL (ref 9–23)
BUN / CREAT RATIO: 11
CALCIUM: 9.7 mg/dL (ref 8.7–10.4)
CHLORIDE: 104 mmol/L (ref 98–107)
CO2: 22 mmol/L (ref 20.0–31.0)
CREATININE: 1.22 mg/dL — ABNORMAL HIGH
EGFR CKD-EPI (2021) MALE: 67 mL/min/{1.73_m2} (ref >=60)
GLUCOSE RANDOM: 167 mg/dL (ref 70–179)
POTASSIUM: 4.4 mmol/L (ref 3.4–4.8)
PROTEIN TOTAL: 7.4 g/dL (ref 5.7–8.2)
SODIUM: 136 mmol/L (ref 135–145)

## 2022-07-29 LAB — CBC W/ AUTO DIFF
BASOPHILS ABSOLUTE COUNT: 0.1 10*9/L (ref 0.0–0.1)
BASOPHILS RELATIVE PERCENT: 0.7 %
EOSINOPHILS ABSOLUTE COUNT: 0.2 10*9/L (ref 0.0–0.5)
EOSINOPHILS RELATIVE PERCENT: 2.1 %
HEMATOCRIT: 37.9 % — ABNORMAL LOW (ref 39.0–48.0)
HEMOGLOBIN: 12.8 g/dL — ABNORMAL LOW (ref 12.9–16.5)
LYMPHOCYTES ABSOLUTE COUNT: 2.2 10*9/L (ref 1.1–3.6)
LYMPHOCYTES RELATIVE PERCENT: 24.1 %
MEAN CORPUSCULAR HEMOGLOBIN CONC: 33.8 g/dL (ref 32.0–36.0)
MEAN CORPUSCULAR HEMOGLOBIN: 26.5 pg (ref 25.9–32.4)
MEAN CORPUSCULAR VOLUME: 78.4 fL (ref 77.6–95.7)
MEAN PLATELET VOLUME: 9.3 fL (ref 6.8–10.7)
MONOCYTES ABSOLUTE COUNT: 0.8 10*9/L (ref 0.3–0.8)
MONOCYTES RELATIVE PERCENT: 8.2 %
NEUTROPHILS ABSOLUTE COUNT: 6 10*9/L (ref 1.8–7.8)
NEUTROPHILS RELATIVE PERCENT: 64.9 %
PLATELET COUNT: 251 10*9/L (ref 150–450)
RED BLOOD CELL COUNT: 4.84 10*12/L (ref 4.26–5.60)
RED CELL DISTRIBUTION WIDTH: 13.8 % (ref 12.2–15.2)
WBC ADJUSTED: 9.2 10*9/L (ref 3.6–11.2)

## 2022-07-29 LAB — BILIRUBIN, DIRECT: BILIRUBIN DIRECT: 0.1 mg/dL (ref 0.00–0.30)

## 2022-07-29 LAB — GAMMA GT: GAMMA GLUTAMYL TRANSFERASE: 215 U/L — ABNORMAL HIGH

## 2022-07-29 LAB — PHOSPHORUS: PHOSPHORUS: 4.6 mg/dL (ref 2.4–5.1)

## 2022-07-29 LAB — MAGNESIUM: MAGNESIUM: 1.6 mg/dL (ref 1.6–2.6)

## 2022-07-29 NOTE — Unmapped (Signed)
[facility] CLINIC PHARMACY NOTE  Matthew Key  [id number]    Medication changes today:   Start Mag plus protein 133 mg daily    Education/Adherence tools provided today:  - Provided updated medication list  - Provided additional education on immunosuppression and transplant related medications including reviewing indications of medications, dosing and side effects    Follow up items:  Goal of understanding indications and dosing of immunosuppression medications  BP  A1c  Diarrhea  ERCP    Next visit with pharmacy in 1-3 months  ____________________________________________________________________    Matthew Key is a 61 y.o. 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     Immunologic Risk: first transplant    Induction Agent : basiliximab    Donor Factors: DBD    Other PMH significant for Hypertension and Stroke, T2DM, OSA on CPAP    Post op course uncomplicated  8/8-8/9 hospitalized due to elevated liver enzymes; He was evaluated with a liver doppler ultrasound which showed patent veins and arteries. Labs were obtained and showed improvement in LFTs.  8/10: EMG with no findings of neuropathy  8/17: Instructed to increase Tresiba from 15 to 17 units daily; start K-lowering supplement.  8/25: increase Mg to 2 tabs BID, continue daily veltassa    Rejection History: NTD  Infection History: NTD  ___________________________________________________________________    Last seen by pharmacy ~ 1 months ago    Interval History:   Admission  11/29 - 12/8: Diarrhea and elevated LFTs  12/5 Liver Bx: No ACR, No CMV. presence of kupffer cells suggesting recent/resolving hepatocellular injury   MRCP 12/17 - biliary duct dilattion    Seen by pharmacy today for: medication management and blood glucose management and education    CC:  Patient has no complaints today    General:  Back and hand/arm pain  Neuro: No issues   CV: No issues   Resp: No issues   GI: Diarrhea   GU: No issues   Derm: No issues  Psych: No issues.    Fluid Status:   Edema no  SOB no  Intake: 6+ bottles of water per day  Output: not assessed  Plan: continue to monitor      There were no vitals filed for this visit.  160/70 in clinic  ___________________________________________________________________    Allergies   Allergen Reactions    Venom-Honey Bee Swelling       Medications reviewed in EPIC medication station and updated today by the clinical pharmacist practitioner.    Current Outpatient Medications   Medication Instructions    acetaminophen (TYLENOL) 325-650 mg, Oral, Every 6 hours PRN    amlodipine (NORVASC) 10 mg, Oral, Daily (standard)    aspirin (ECOTRIN) 81 mg, Oral, Daily (standard)    blood sugar diagnostic (ACCU-CHEK GUIDE TEST STRIPS) Strp Use to check blood sugar as directed with insulin 3 times a day & for symptoms of high or low blood sugar    blood-glucose meter kit Use as instructed    blood-glucose meter Misc Use to test blood sugar as directed    DULoxetine (CYMBALTA) 60 mg, Oral, Daily (standard)    everolimus, immunosuppressive, (ZORTRESS) 0.5 mg tablet Take 4 tablets (2 mg total) by mouth daily AND 2 tablets (1 mg total) nightly.    gabapentin (NEURONTIN) 300 mg, Oral, 3 times a day (standard)    glucagon spray 3 mg/actuation Spry Use 1 spray into the left nostril every fifteen (15)  minutes as needed (low blood sugar).    hydrOXYzine (VISTARIL) 50 mg, Oral, Nightly PRN    insulin ASPART (NOVOLOG FLEXPEN) 100 unit/mL (3 mL) injection pen Inject under the skin per sliding scale prior to meals. If premeal BG 151-200 take 2 additional units for correction, if 201-250 take 4 additional units, etc. Max dose 36 units per day.  Store in-use prefilled pens at room temperature <86??F and use within 28 days; do not refrigerate.    insulin degludec (TRESIBA FLEXTOUCH U-100) 15 Units, Subcutaneous, Daily (standard), Adjust as instructed.    lancets (ACCU-CHEK SOFTCLIX LANCETS) Misc Use to check blood sugar as directed with insulin 3 times a day & for symptoms of high or low blood sugar.    loperamide (IMODIUM) 2 mg capsule Take 1 capsule by mouth three times a day, end after 12/15, then take 1 capsule daily as needed for loose stools    melatonin 3 mg, Oral, Every evening    methocarbamol (ROBAXIN) 500 mg, Oral, 3 times a day PRN    OZEMPIC 0.5 mg, Subcutaneous, Every 7 days, HOLD until told to resume by your coordinator    pantoprazole (PROTONIX) 40 mg, Oral, Daily PRN    pen needle, diabetic (BD ULTRA-FINE NANO PEN NEEDLE) 32 gauge x 5/32 (4 mm) Ndle Use as directed  1-2 times per day    psyllium (METAMUCIL) 3.4 gram packet 1 packet, Oral, 2 times a day (standard)    tacrolimus (PROGRAF) 1 MG capsule Take 3 capsules (3 mg total) by mouth daily AND 2 capsules (2 mg total) nightly.    ursodioL (ACTIGALL) 300 mg, Oral, 3 times a day (standard)    XIFAXAN 550 mg, Oral, 3 times a day (standard)       GRAFT FUNCTION: stable    Lab Results   Component Value Date    AST 79 (H) 07/25/2022    ALT 176 (H) 07/25/2022    Total Bilirubin 0.5 07/25/2022      Zero hour biopsy: mild reperfusion injury on donor graft. No malignancy on explanted liver.  Biopsies to date: 12/5 - presence of kupffer cells suggesting recent/resolving hepatocellular injury     Renal Function: stable but above pre txp baseline of 0.9    Lab Results   Component Value Date    Creatinine 1.33 (H) 07/25/2022    Creatinine 1.27 07/23/2022    Creatinine 1.11 07/19/2022    Creatinine 1.06 07/18/2022    Creatinine 0.97 07/17/2022    Creatinine 1.28 (H) 07/16/2022    Creatinine 1.51 (H) 07/09/2022    Creatinine 1.38 (H) 07/02/2022       Proteinuria/UPC: No/ none.  Lab Results   Component Value Date    Protein/Creatinine Ratio, Urine 0.156 04/08/2022       CURRENT IMMUNOSUPPRESSION:    Tacrolimus (Prograf) 3 mg every morning + 2 mg every evening    Tacrolimus Goal: 3 - 5  Everolimus 2 mg AM and 1 mg PM  Everolimus goal: 3-8    Switched from MMF to allow for decreased tac goal and preserve renal function    IMMUNOSUPPRESSION DRUG LEVELS:  Lab Results   Component Value Date    Tacrolimus, Trough 6.3 05/06/2022    Tacrolimus, Trough 11.4 03/20/2022    Tacrolimus, Trough 11.7 03/13/2022    Tacrolimus Lvl 4.7 07/25/2022    Tacrolimus Lvl 4.1 07/23/2022    Tacrolimus Lvl 7.7 07/09/2022    Tacrolimus, Timed 4.5 07/18/2022    Tacrolimus, Timed 4.2 07/16/2022  Tacrolimus, Timed 10.7 07/14/2022     Lab Results   Component Value Date    Everolimus Level 3.5 07/25/2022    Everolimus Level 3.1 07/23/2022    Everolimus Level 4.0 07/18/2022       Everolimus and Prograf level is accurate 12 hour trough     WBC/ANC:  wnl  Lab Results   Component Value Date    WBC 7.2 07/25/2022       Plan: No change    OI Prophylaxis:   CMV Status: D+/ R+, moderate risk .       CVA history: asa 81 mg   The 10-year ASCVD risk score (Arnett DK, et al., 2019) is: 23.8%  No results found for: LIPID    Statin therapy: Indicated; none (atorvastatin held for elevated LFTs)  Plan: Continue to monitor       BP: Goal < 140/90. High in clinic - pt says due to abdominal pain  Home BP ranges: no log today in clinic, but reports average of 140s  Current meds include: amlodipine 10 mg daily  Plan: out of goal. Continue to monitor    Anemia of CKD:  H/H:   Lab Results   Component Value Date    HGB 12.3 (L) 07/25/2022     Lab Results   Component Value Date    HCT 37.0 (L) 07/25/2022     Iron panel:  Lab Results   Component Value Date    IRON 85 03/11/2022    TIBC 279 03/11/2022    FERRITIN 250.9 03/11/2022     Lab Results   Component Value Date    Iron Saturation (%) 30 03/11/2022     Prior ESA use: none post transplnat  Plan: stable. No change.  Continue to monitor.     DM:   Lab Results   Component Value Date    A1C 6.5 (H) 05/06/2022   . Goal A1c < 7  A1c increasing  History of Dm? Yes: T2DM  Established with endocrinologist/PCP for BG managment? Yes: Dr. Melrose Nakayama with [facility]    Currently on: Tresiba 15 units daily, Novolog SSI 2: 50 > 150 mg/dl  Patient denies any episodes of hypoglycemia since last clinic visit    Previous meds:Ozempic stopped in setting of diarrhea    Plan: No change. Repeat A1c. Continue to monitor.       Electrolytes: wnl.  Lab Results   Component Value Date    Potassium 5.1 07/25/2022    Potassium, Bld 4.2 02/06/2022    Sodium 138 07/25/2022    Sodium Whole Blood 141 02/06/2022    Magnesium 1.4 (L) 07/25/2022    CO2 25 07/25/2022     Meds currently on: Mg on hold y   Plan: Restart Mag at 1 tab daily. Continue to monitor    GI/BM: pt reports constipation  Meds currently on: pantoprazole 40 mg daily PRN  Plan: Continue to monitor    Pain: pt reports moderate pain despite use of below regimen    Meds currently on: APAP PRN, gabapentin 300 mg TID, methocarbamol 500 mg TID prn (using TID most days), Cymbalta 60 mg daily  Plan: Continue to monitor      Bone health:   Vitamin D Level: last level is   . Goal > 30.   Lab Results   Component Value Date    Vitamin D Total (25OH) 43.1 05/06/2022       Lab Results   Component Value Date  Calcium 9.7 07/25/2022    Calcium 9.7 07/23/2022     Last DEXA results:  01/2020: osteopenia (spine T score -1.1, femoral neck -1.7)  Current meds include: none  Plan: Vitamin D level  within goal. Consider adding Vitamin D and calcium given osteopenia if D is low. Continue to monitor.     Anxiety:  Meds currently on: hydroxyzine 50 HS PRN (rarely needs)    Women's/Men's Health:  Matthew Key is a 61 y.o. male. Patient reports no men's/women's health issues  Plan: Continue to monitor    Immunizations:  Influenza [Annual]: Received 06/2021, 05/06/22    PCV13: Received 11/2017  PPSV23: Received 03/2020  PCV20: Received 08/2021    Shingrix Zoster [2 doses, 2 - 6 months apart]: Received 05/2020, 07/2020    COVID-19 [3 primary doses, 2 boosters]: 1st dose given 10/2019, 2nd dose given 10/2019, 3rd dose given 05/2020, and Booster given 08/2021 (bivalent)    Immunization status: Recommended COVID-19 and RSV vaccines.     Pharmacy preference:  [facility]  Medication Refills:  N/a  Medication Access:  N/a    Adherence: Patient has good understanding of medications; was able to independently identify names/doses of immunosuppressants and OI meds.  Patient  does not fill their own pill box on a regular basis at home. States he has stopped using his pillbox and takes meds from bottle utilizing MyChart and MedAction Plan to know which medications to take.   Patient brought medication card:no  Pill box:did not bring   Plan: Asked that he bring filled box for pharmacist review to next visit; provided extensive adherence counseling/intervention    Patient was reviewed with  Dr. Imogene Burn  who was agreement with the stated plan:     During this visit, the following was completed:   BG log data assessment  BP log data assessment  Labs ordered and evaluated  complex treatment plan >1 DS   I spent a total of 40 minutes face to face with the patient delivering clinical care and providing education/counseling.    All questions/concerns were addressed to the patient's satisfaction.  __________________________________________  Olivia Mackie, CPP  Solid Organ Transplant Clinical Pharmacist Practitioner

## 2022-07-29 NOTE — Unmapped (Signed)
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 Infectious workup was unrevealing. Liver biopsy with no evidence of acute rejection but on pathology conference review, demonstrated some focal bile duct injury/mild lobular hepatitis. MRCP demonstrates intrahepatic biliary dilation with abrupt caliber change from the common hepatic to common bile duct (1.1 to 0.5 cm). AST/ALT remain elevated, as do alk phos and GGT, though Tbili and D bili are within normal limits.    - Recommend ERCP for further evaluation/management of possible biliary stricture  - Repeat labs today  - Finish 2 week course of rifaximin, continue imodium once/day as needed    Subjective  Toriano Welford Guzek is a 61 y.o. 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 His infectious workup was negative including serum PCR and colon biopsy for CMV. He was started on fiber supplementation, imodium, and a 2 week course of rifaximin for his diarrhea. His liver biopsy demonstrated findings of resolving injury likely transient hypoperfusion in setting of volume depletion from high volume diarrhea. LFTs were normalizing at time of discharge. 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 No evidence of acute cellular rejection.     Since discharge on 12/8 he reports his diarrhea has resolved. He is having regular formed BM and requiring imodium only once/day. He continues to take rifaximin as scheduled. He underwent MRCP with concern for significant caliber change from common hepatic duct to common bile duct. LFTs remain elevated, though Tbili is within normal limits    12/14 labs: AST 79, ALT 176, Alk phos 347, GGT 285    Objective    Vitals:    07/29/22 1312   BP: 162/74   Pulse: 80   Temp: 36.2 ??C (97.2 ??F)   TempSrc: Tympanic   Weight: 86.5 kg (190 lb 11.2 oz)   Height: 172.7 cm (5' 8)      Body mass index is 29 kg/m??.     Physical Exam:    General Appearance:    No acute distress  Lungs:                 Normal work of breathing on room air  Heart:                            Regular rate and rhythm  Abdomen:                 Soft, nondistended, well healing mercedes incision, mild tenderness to palpation in suprapubic region  Extremities:               Warm and well perfused      Data Review:  All lab results last 24 hours:  No results found for this or any previous visit (from the past 24 hour(s)).      Imaging:   MRCP 07/28/22:  --Mild intrahepatic biliary ductal dilation with ectasia of the proximal common hepatic duct. There is abrupt caliber transition from a 1.1 cm to 0.5 cm, which may be at site of biliary anastomosis, with caliber difference related to native versus donor bile duct, although anastomotic stricture is also possible. Correlate with biliary function tests.      --Subcentimeter peripheral hypoenhancing  lesion in segment VI of the transplant liver, too small to characterize.      --Generalized hypointense signal of the pancreas, which may be related to sequela of chronic or prior pancreatitis. There are multiple cystic lesions of the pancreas measuring up to 1.6 cm. 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 No pancreatic ductal dilation. Continued attention on follow-up to ensure stability over time.

## 2022-07-29 NOTE — Unmapped (Signed)
Patient seen in surgery clinic today as f/up from his recent hospitalization for elevated transaminases and diarrhea. He reports he feels fine today and stated that his diarrhea is well controlled, since he has been taking the xifaxan and imodiume (2mg  daily.) He has no c/o n/v/fever/chills/abdominal pain/swelling/dysuria or issues at his incision site or hernias. He completed MRCP yesterday, which was reviewed with him by Dr.Chen, who also discussed it with Dr.Desai. Per their recommendation, order placed for ERCP to r/o any stricture to bile duct. Patient made aware of this and encouraged to complete labs after visit today (no IS levels run d/t his having taken his med this morning.) Patient will be seen again in clinic by his primary hepatologist on 1/5. Sent MRCP report to him for review as well and f/up regarding his pancreatic lesions. He recommended repeat imaging in 6-12 mos. About 6-10 min spent in post-liver txp health maintenance education. Patient verbalized understanding of all discussed. All meds reviewed  with patient by PharmD Christena Deem.

## 2022-07-30 NOTE — Unmapped (Signed)
ERCP  Procedure #1     Procedure #2   [id number]  MRN   Dr. Edyth Gunnels, Dr. Council Mechanic, Dr. Iran Planas, or Dr. Penny Pia Endoscopist     Is the patient's health insurance [employer], [facility]), or [employer]?     Urgent procedure     Are you pregnant?     Are you in the process of scheduling or awaiting results of a heart ultrasound, stress test, or catheterization to evaluate new or worsening chest pain, dizziness, or shortness of breath?     Do you take: Plavix (clopidogrel), Coumadin (warfarin), Lovenox (enoxaparin), Pradaxa (dabigatran), Effient (prasugrel), Xarelto (rivaroxaban), Eliquis (apixaban), Pletal (cilostazol), or Brilinta (ticagrelor)?          Which of the above medications are you taking?          What is the name of the medical practice that manages this medication?          What is the name of the medical provider who manages this medication?     Do you have hemophilia, von Willebrand disease, or low platelets?     Do you have a pacemaker or implanted cardiac defibrillator?     Has a [facility] provider specified the location(s)?     Which location(s) did the [facility] provider specify[facility]        [facility]        [place]        [facility]        [facility]     Is procedure indication for variceal banding (this does NOT include variceal screening)?     Have you had a heart attack, stroke or heart stent placement within the past 6 months?     Month of event     Year of event (ONLY ENTER LAST 2 DIGITS)        5  Height (feet)   8  Height (inches)   190  Weight (pounds)   28.9  BMI          Did the ordering provider specify a bowel prep?          What bowel prep was specified?     Do you have chronic kidney disease?     Do you have chronic constipation or have you had poor quality bowel preps for past colonoscopies?     Do you have Crohn's disease or ulcerative colitis?     Have you had weight loss surgery?          When you walk around your house or grocery store, do you have to stop and rest due to shortness of breath, chest pain, or light-headedness?     Do you ever use supplemental oxygen?     Have you been hospitalized for cirrhosis of the liver or heart failure in the last 12 months?     Have you been treated for mouth or throat cancer with radiation or surgery?     Have you been told that it is difficult for doctors to insert a breathing tube in you during anesthesia?     Have you had a heart or lung transplant?          Are you on dialysis?     Do you have cirrhosis of the liver?     Do you have myasthenia gravis?     Is the patient a prisoner?          Have you been diagnosed with  sleep apnea or do you wear a CPAP machine at night?     Are you younger than 30?     Have you previously received propofol sedation administered by an anesthesiologist for a GI procedure?     Do you drink an average of more than 3 drinks of alcohol per day?     Do you regularly take suboxone or any prescription medications for chronic pain?     Do you regularly take Ativan, Klonopin, Xanax, Valium, lorazepam, clonazepam, alprazolam, or diazepam?     Have you previously had difficulty with sedation during a GI procedure?     Have you been diagnosed with PTSD?     Are you allergic to fentanyl or midazolam (Versed)?     Do you take medications for HIV?   ################# ## ###################################################################################################################   MRN:          [id number]   Anticoag Review:  No   Nurse Triage:  No   GI Clinic Consult:  No   Procedure(s):  ERCP     0   [facility]  [facility]     [facility]     [place]     [facility]   Endoscopist:  Dr. Edyth Gunnels, Dr. Council Mechanic, Dr. Iran Planas, or Dr. Penny Pia   Urgent:            No   Prep:                                 ################# ## ###################################################################################################################

## 2022-07-31 DIAGNOSIS — Z944 Liver transplant status: Principal | ICD-10-CM

## 2022-07-31 DIAGNOSIS — E612 Magnesium deficiency: Principal | ICD-10-CM

## 2022-07-31 DIAGNOSIS — Z5181 Encounter for therapeutic drug level monitoring: Principal | ICD-10-CM

## 2022-07-31 NOTE — Unmapped (Unsigned)
[facility] Outpatient Nutrition Services   Medical Nutrition Therapy Consultation       Visit Type:    Return Assessment    Referral Reason: :  Liver Transplant 02/06/22      Matthew Key is a 61 y.o. male seen for medical nutrition therapy for liver transplant follow up. His active problem list, medication list and allergies were reviewed.     His interim medical history is significant for elevated creatinine, continued hyperkalemia.    Anthropometrics   Estimated body mass index is 29 kg/m?? as calculated from the following:    Height as of 07/29/22: 172.7 cm (5' 8).    Weight as of 07/29/22: 86.5 kg (190 lb 11.2 oz).    Wt Readings from Last 5 Encounters:   07/29/22 86.5 kg (190 lb 11.2 oz)   07/18/22 86 kg (189 lb 9.6 oz)   05/06/22 87.5 kg (193 lb)   04/08/22 86.7 kg (191 lb 1.6 oz)   04/08/22 86.7 kg (191 lb 1.6 oz)      Usual body weight: ***    ~193 lbs in September 2023    ~201 lb on home scale in June 2023   198 lbs per patient prior to transplant  Ideal Body Weight:   69.9 kg       Nutrition Risk Screening:     Nutrition Focused Physical Exam:    Unable to assess given virtual visit             Malnutrition Screening:   Malnutrition Assessment using AND/ASPEN Clinical Characteristics:     Unable to assess given inability to perform nutrition focused physical exam        Biochemical Data, Medical Tests and Procedures:  All pertinent labs and imaging reviewed by Idolina Primer, RD/LDN at 8:47 AM 07/31/2022.    ***    Patient has a Libre 14. He reports his blood glucoses have been pretty normal, nothing very high. He reports his 7 day average is ~150 and his 30 day average is 145.       Lab Results   Component Value Date    Hemoglobin A1C 6.5 (H) 05/06/2022    Hemoglobin A1C 5.2 02/05/2022    Hemoglobin A1C 4.7 (L) 01/14/2022      No results found for: VITAMINA  Lab Results   Component Value Date    Vitamin D Total (25OH) 43.1 05/06/2022    Vitamin D Total (25OH) 27.9 09/11/2020    Vitamin D Total (25OH) 13.9 (L) 05/25/2019     No results found for: VITAME  No results found for: CRP  No results found for: ZINC  No results found for: COPPER    Lab Results   Component Value Date    BUN 13 07/29/2022    CREATININE 1.22 (H) 07/29/2022    GFRAA 33 (L) 03/22/2022    GFRNONAA 72 04/05/2019    NA 136 07/29/2022    K 4.4 07/29/2022    CL 104 07/29/2022    CO2 22.0 07/29/2022    CALCIUM 9.7 07/29/2022    PHOS 4.6 07/29/2022    ALBUMIN 4.0 07/29/2022       Medications and Vitamin/Mineral Supplementation:   All nutritionally pertinent medications reviewed on 07/31/2022.   Nutritionally pertinent medications include: novolog, tresiba, Imodium (new medication ***), metamucil, Ozempic (*** new medication)  He is not taking nutrition supplements.     Current Outpatient Medications   Medication Sig Dispense Refill    acetaminophen (TYLENOL)  325 MG tablet Take 1-2 tablets (325-650 mg total) by mouth every six (6) hours as needed for pain or fever. 100 tablet 11    amlodipine (NORVASC) 10 MG tablet Take 1 tablet (10 mg total) by mouth daily. 30 tablet 11    aspirin (ECOTRIN) 81 MG tablet Take 1 tablet (81 mg total) by mouth daily. 90 tablet 3    blood sugar diagnostic (ACCU-CHEK GUIDE TEST STRIPS) Strp Use to check blood sugar as directed with insulin 3 times a day & for symptoms of high or low blood sugar 100 strip 11    blood-glucose meter Misc Use to test blood sugar as directed 1 each 0    blood-glucose meter kit Use as instructed 1 each 0    DULoxetine (CYMBALTA) 30 MG capsule Take 2 capsules (60 mg total) by mouth daily. 180 capsule 3    everolimus, immunosuppressive, (ZORTRESS) 0.5 mg tablet Take 4 tablets (2 mg total) by mouth daily AND 2 tablets (1 mg total) nightly. 180 tablet 11    gabapentin (NEURONTIN) 300 MG capsule Take 1 capsule (300 mg total) by mouth Three (3) times a day. 90 capsule 2    glucagon spray 3 mg/actuation Spry Use 1 spray into the left nostril every fifteen (15) minutes as needed (low blood sugar). 2 each 1    hydrOXYzine (VISTARIL) 50 MG capsule Take 1 capsule (50 mg total) by mouth nightly as needed for anxiety. 30 capsule 3    insulin ASPART (NOVOLOG FLEXPEN) 100 unit/mL (3 mL) injection pen Inject under the skin per sliding scale prior to meals. If premeal BG 151-200 take 2 additional units for correction, if 201-250 take 4 additional units, etc. Max dose 36 units per day.  Store in-use prefilled pens at room temperature <86??F and use within 28 days; do not refrigerate. 15 mL 6    insulin degludec (TRESIBA FLEXTOUCH U-100) 100 unit/mL (3 mL) InPn Inject 0.15 mL (15 Units total) under the skin daily. Adjust as instructed. 15 mL 3    lancets (ACCU-CHEK SOFTCLIX LANCETS) Misc Use to check blood sugar as directed with insulin 3 times a day & for symptoms of high or low blood sugar. 100 each 0    loperamide (IMODIUM) 2 mg capsule Take 1 capsule by mouth three times a day, end after 12/15, then take 1 capsule daily as needed for loose stools 60 capsule 5    melatonin 3 mg Tab Take 1 tablet (3 mg total) by mouth every evening. 30 tablet 11    methocarbamoL (ROBAXIN) 500 MG tablet Take 1 tablet (500 mg total) by mouth Three (3) times a day as needed. 90 tablet 0    pantoprazole (PROTONIX) 40 MG tablet Take 1 tablet (40 mg total) by mouth daily as needed. 30 tablet 11    pen needle, diabetic (BD ULTRA-FINE NANO PEN NEEDLE) 32 gauge x 5/32 (4 mm) Ndle Use as directed  1-2 times per day 100 each 12    psyllium (METAMUCIL) 3.4 gram packet Take 1 packet by mouth two (2) times a day. 60 each 11    rifAXIMin (XIFAXAN) 550 mg Tab Take 1 tablet (550 mg total) by mouth Three (3) times a day for 14 days. 42 tablet 0    semaglutide (OZEMPIC) 0.25 mg or 0.5 mg (2 mg/3 mL) PnIj Inject 0.5 mg under the skin every seven (7) days. HOLD until told to resume by your coordinator 6 mL 11    tacrolimus (PROGRAF) 1 MG capsule  Take 3 capsules (3 mg total) by mouth daily AND 2 capsules (2 mg total) nightly. 180 capsule 11    ursodioL (ACTIGALL) 300 mg capsule Take 1 capsule (300 mg total) by mouth Three (3) times a day. 90 capsule 11     No current facility-administered medications for this visit.       Nutrition History:     Dietary Restrictions: No known food allergies or food intolerances.     Gastrointestinal Issues: Denied issues   ***      No pain or nausea. He does report having more bowel movements, but not more than 5x per day. He reports having some formed and some loose bowel movements, he is on colace.     Hunger and Satiety: Poor appetite and limited intake.   Early satiety.   Patient reports having a good appetite now that his staples are out     Food Safety and Access: No to little issues noted.     Diet Recall:  ***  Time Intake   Breakfast Boost Original   Cereal + milk and egg and sausage or oatmeal    Snack (AM)    Lunch Protein + vegetable (usually lima beans or green beans)   Snack (PM)    Dinner Cheeseburger and spinach salad     Snack (HS) Boost Original      Food-Related History:   ***  Beverages: 3-4 bottles of water per day, Boost Original - Chocolate  Dining Out:  Minimal   Usual Food Choices: peas, green beans, peanut butter, lima beans, green beans, milk, macaroni & cheese, chicken, brown rice   Does not eat fruit per his report     Physical Activity:  Physical activity level is sedentary with little to no exercise.    He walks around the house but has not been exerting himself much.     Daily Estimated Nutrient Needs:  Energy: 1750- 2100 kcals [25- 30 kcal/kg using ideal body weight, 70 kg]    Protein: 70- 85 gm [1.0- 1.2 gm/kg using ideal body weight, 70 kg]  Fluid:   [per MD team]  Sodium:  <2000 mg     Nutrition Goals & Evaluation      1. Meet estimated daily needs (Progressing)  2. Normal vitamin levels (Ongoing)  3. Balanced macronutrient intake (Progressing)  4. Hemoglobin A1c <7% (Met)    Nutrition goals reviewed, and relevant barriers identified and addressed: emotional state. He is evaluated to have good willingness and ability to achieve nutrition goals.     Nutrition Assessment     *** potential biliary stricture - that is why on fiber & imodium       Per the patient's diet recall, nutrition intake is inadequate but is improving and is close to meeting estimated nutrition needs. Patient's intake of Boost shakes continues, and discussed that he could decrease his intake as solid food intake increases. Reiterated importance of having no more than 2 bottles per day and eating 2-3 snacks per day of peanut butter, eggs etc to improve labs and overall nutrition. Boost Original has ~470 mg K+ which is a high in potassium.  Discussed high potassium foods and recommended limiting his intake of potassium. Patient reports he knew his potassium would be high this lab draw [place] diet was not good because there were not many options available to him. He also had orange juice at the hotel, which he knows it is  high in potassium.  Patient with relatively good glucose control post transplant in light of prednisone.     Nutrition Intervention      Nutrition Education: ***    Materials Provided were: verbal tips and suggestions     Nutrition Plan:  ***  Consume 1 oral nutrition supplement daily   Eat 4 small, frequent protein containing meals per day   Space meals out every 3-4 hours while awake  Increase exercise/safe movement to 30 minutes, 5 days per week     Follow up will occur at 6 months post transplant.     Food/Nutrition-related history and Anthropometric measurements will be assessed at time of follow-up.         Recommendations for Care Team:  ***  Monitor potassium level and blood glucoses        {    Coding tips - Do not edit this text, it will delete upon signing of note!    Telephone visits [phone] for Physicians and APPs and [phone] for Non- Physician Clinicians)- Only use minutes on the phone to determine level of service.    Video visits [phone]) - Use either level of medical decision making just as an in-person visit OR time which includes both minutes on video and pre/post minutes to determine the level of service.      :[id number]}  The patient reports they are currently: at home. I spent *** minutes on the phone visit with the patient on the date of service. I spent an additional 28  minutes on pre- and post-visit activities on the date of service.     The patient was not located and I was located within 250 yards of a hospital based location during the phone visit. The patient was physically located in [place] or a state in which I am permitted to provide care. The patient and/or parent/guardian understood that s/he may incur co-pays and cost sharing, and agreed to the telemedicine visit. The visit was reasonable and appropriate under the circumstances given the patient's presentation at the time.    The patient and/or parent/guardian has been advised of the potential risks and limitations of this mode of treatment (including, but not limited to, the absence of in-person examination) and has agreed to be treated using telemedicine. The patient's/patient's family's questions regarding telemedicine have been answered.    If the visit was completed in an ambulatory setting, the patient and/or parent/guardian has also been advised to contact their provider???s office for worsening conditions, and seek emergency medical treatment and/or call 911 if the patient deems either necessary.      Lanelle Bal, RD, LDN  Abdominal Transplant Dietitian   Pager: [phone]

## 2022-08-01 ENCOUNTER — Encounter: Admit: 2022-08-01 | Discharge: 2022-08-01 | Payer: MEDICARE | Attending: Anesthesiology | Primary: Anesthesiology

## 2022-08-01 ENCOUNTER — Ambulatory Visit: Admit: 2022-08-01 | Discharge: 2022-08-01 | Payer: MEDICARE

## 2022-08-01 MED ORDER — BLOOD-GLUCOSE METER
0 refills | 0 days
Start: 2022-08-01 — End: 2023-08-01

## 2022-08-01 MED ADMIN — acetaminophen (TYLENOL) tablet 650 mg: 650 mg | ORAL | @ 18:00:00 | Stop: 2022-08-01

## 2022-08-01 MED ADMIN — Propofol (DIPRIVAN) injection: INTRAVENOUS | @ 16:00:00 | Stop: 2022-08-01

## 2022-08-01 MED ADMIN — labetalol (NORMODYNE) injection: 10 mg | INTRAVENOUS | @ 17:00:00 | Stop: 2022-08-01

## 2022-08-01 MED ADMIN — fentaNYL (PF) (SUBLIMAZE) injection: INTRAVENOUS | @ 15:00:00 | Stop: 2022-08-01

## 2022-08-01 MED ADMIN — fentaNYL (PF) (SUBLIMAZE) injection: INTRAVENOUS | @ 16:00:00 | Stop: 2022-08-01

## 2022-08-01 MED ADMIN — succinylcholine (ANECTINE) injection: INTRAVENOUS | @ 15:00:00 | Stop: 2022-08-01

## 2022-08-01 MED ADMIN — lactated Ringers infusion: 10 mL/h | INTRAVENOUS | @ 15:00:00 | Stop: 2022-08-01

## 2022-08-01 MED ADMIN — ondansetron (ZOFRAN) injection: INTRAVENOUS | @ 16:00:00 | Stop: 2022-08-01

## 2022-08-01 MED ADMIN — dexAMETHasone (DECADRON) 4 mg/mL injection: INTRAVENOUS | @ 15:00:00 | Stop: 2022-08-01

## 2022-08-01 MED ADMIN — indomethacin (INDOCIN) 50 mg suppository: RECTAL | @ 16:00:00 | Stop: 2022-08-01

## 2022-08-01 MED ADMIN — lidocaine (XYLOCAINE) 20 mg/mL (2 %) injection: INTRAVENOUS | @ 15:00:00 | Stop: 2022-08-01

## 2022-08-01 MED ADMIN — Propofol (DIPRIVAN) injection: INTRAVENOUS | @ 15:00:00 | Stop: 2022-08-01

## 2022-08-01 NOTE — Unmapped (Addendum)
As per provider called pt and rescheduled today's virtual nutrition appt to 12/28 at 10:30. He verbalized understanding.

## 2022-08-02 ENCOUNTER — Ambulatory Visit
Admit: 2022-08-02 | Discharge: 2022-08-03 | Disposition: A | Discharge status: Home / Self Care | Payer: MEDICARE | Attending: Emergency Medicine

## 2022-08-02 ENCOUNTER — Emergency Department
Admit: 2022-08-02 | Discharge: 2022-08-03 | Disposition: A | Discharge status: Home / Self Care | Payer: MEDICARE | Attending: Emergency Medicine

## 2022-08-02 DIAGNOSIS — Z944 Liver transplant status: Principal | ICD-10-CM

## 2022-08-02 DIAGNOSIS — Z9889 Other specified postprocedural states: Principal | ICD-10-CM

## 2022-08-02 LAB — CBC W/ AUTO DIFF
BASOPHILS ABSOLUTE COUNT: 0.3 10*9/L — ABNORMAL HIGH (ref 0.0–0.1)
BASOPHILS RELATIVE PERCENT: 2.3 %
EOSINOPHILS ABSOLUTE COUNT: 0.1 10*9/L (ref 0.0–0.5)
EOSINOPHILS RELATIVE PERCENT: 0.6 %
HEMATOCRIT: 41.1 % (ref 39.0–48.0)
HEMOGLOBIN: 13.6 g/dL (ref 12.9–16.5)
LYMPHOCYTES ABSOLUTE COUNT: 1.9 10*9/L (ref 1.1–3.6)
LYMPHOCYTES RELATIVE PERCENT: 14.1 %
MEAN CORPUSCULAR HEMOGLOBIN CONC: 33.2 g/dL (ref 32.0–36.0)
MEAN CORPUSCULAR HEMOGLOBIN: 26.2 pg (ref 25.9–32.4)
MEAN CORPUSCULAR VOLUME: 78.9 fL (ref 77.6–95.7)
MEAN PLATELET VOLUME: 9.2 fL (ref 6.8–10.7)
MONOCYTES ABSOLUTE COUNT: 0.9 10*9/L — ABNORMAL HIGH (ref 0.3–0.8)
MONOCYTES RELATIVE PERCENT: 6.8 %
NEUTROPHILS ABSOLUTE COUNT: 10.4 10*9/L — ABNORMAL HIGH (ref 1.8–7.8)
NEUTROPHILS RELATIVE PERCENT: 76.2 %
PLATELET COUNT: 235 10*9/L (ref 150–450)
RED BLOOD CELL COUNT: 5.21 10*12/L (ref 4.26–5.60)
RED CELL DISTRIBUTION WIDTH: 14.3 % (ref 12.2–15.2)
WBC ADJUSTED: 13.6 10*9/L — ABNORMAL HIGH (ref 3.6–11.2)

## 2022-08-02 LAB — COMPREHENSIVE METABOLIC PANEL
ALBUMIN: 4.2 g/dL (ref 3.4–5.0)
ALKALINE PHOSPHATASE: 251 U/L — ABNORMAL HIGH (ref 46–116)
ALT (SGPT): 50 U/L — ABNORMAL HIGH (ref 10–49)
ANION GAP: 8 mmol/L (ref 5–14)
AST (SGOT): 26 U/L (ref <=34)
BILIRUBIN TOTAL: 0.4 mg/dL (ref 0.3–1.2)
BLOOD UREA NITROGEN: 13 mg/dL (ref 9–23)
BUN / CREAT RATIO: 11
CALCIUM: 10.3 mg/dL (ref 8.7–10.4)
CHLORIDE: 102 mmol/L (ref 98–107)
CO2: 26 mmol/L (ref 20.0–31.0)
CREATININE: 1.16 mg/dL
EGFR CKD-EPI (2021) MALE: 71 mL/min/{1.73_m2} (ref >=60)
GLUCOSE RANDOM: 269 mg/dL — ABNORMAL HIGH (ref 70–179)
POTASSIUM: 4.2 mmol/L (ref 3.4–4.8)
PROTEIN TOTAL: 8 g/dL (ref 5.7–8.2)
SODIUM: 136 mmol/L (ref 135–145)

## 2022-08-02 LAB — SLIDE REVIEW

## 2022-08-02 LAB — LIPASE: LIPASE: 21 U/L (ref 12–53)

## 2022-08-02 MED ORDER — OXYCODONE 5 MG TABLET
ORAL_TABLET | ORAL | 0 refills | 2 days | Status: CP | PRN
Start: 2022-08-02 — End: 2022-08-07

## 2022-08-02 MED ORDER — LEVOFLOXACIN 500 MG TABLET
ORAL_TABLET | ORAL | 0 refills | 4 days | Status: CP
Start: 2022-08-02 — End: 2022-08-06

## 2022-08-02 MED ORDER — ONDANSETRON 4 MG DISINTEGRATING TABLET
ORAL_TABLET | Freq: Three times a day (TID) | ORAL | 0 refills | 3 days | Status: CP | PRN
Start: 2022-08-02 — End: 2022-08-09

## 2022-08-02 NOTE — Unmapped (Signed)
Patient contacted [facility] this afternoon after picking up his abx, reporting that his abd pain is 10/10 now, BP-169/81, but temp is 65F. Provided him with GI procedures number to discuss recommendations with them and to return call to [facility] if offered no suggestions, adding that he may need to come to [facility] if pain is unmanageable. He returned call this afternoon,stating he left a msg with GI, but was unable to get in touch with anyone. Reached out to Dr.Chen, who advised that patient should be evaluated now at [facility] ED. Contacted patient and relayed recommendation. He verbalized understanding.

## 2022-08-02 NOTE — Unmapped (Signed)
Patient completed ercp yesterday with three stents placed and received levaquin with procedure. Discussed with Dr.Desai, who recommended patient complete 4 more days of therapy for cholangitis prophylaxis. Spoke with patient, who reported he was having some abdominal pain today. Explained that that can happen, but encouraged him to maintain good hydration and report any fever/chills/n/v to txp team. Relayed doctor recommendation for abx therapy and instructed him to avoid using hydroxyzine while using levaquin d/t risk of prolonged qt interval. He verbalized understanding of all discussed. Sent 500mg  daily levaquin script x 4 days to his local [employer].

## 2022-08-03 MED ORDER — OXYCODONE 5 MG TABLET
ORAL_TABLET | ORAL | 0 refills | 2 days | Status: CP | PRN
Start: 2022-08-03 — End: 2022-08-08

## 2022-08-03 MED ORDER — ONDANSETRON 4 MG DISINTEGRATING TABLET
ORAL_TABLET | Freq: Three times a day (TID) | ORAL | 0 refills | 3 days | Status: CP | PRN
Start: 2022-08-03 — End: 2022-08-10

## 2022-08-03 MED ADMIN — iohexol (OMNIPAQUE) 350 mg iodine/mL solution 100 mL: 100 mL | INTRAVENOUS | @ 03:00:00 | Stop: 2022-08-02

## 2022-08-03 MED ADMIN — morphine 4 mg/mL injection 4 mg: 4 mg | INTRAVENOUS | @ 02:00:00 | Stop: 2022-08-02

## 2022-08-03 MED ADMIN — lactated ringers bolus 1,000 mL: 1000 mL | INTRAVENOUS | @ 02:00:00 | Stop: 2022-08-02

## 2022-08-03 MED ADMIN — morphine 4 mg/mL injection 4 mg: 4 mg | INTRAVENOUS | @ 04:00:00

## 2022-08-03 NOTE — Unmapped (Signed)
Received call from Dr. Imogene Burn who noticed that patient was not kept overnight when he went to the ED yesterday and wanted to make sure that he was doing okay today. Dr. Imogene Burn reviewed his labs from the ED and mentioned they were okay and for him to get labs again on Tuesday.      Called patient who mentioned he is better than he was yesterday when he went to the ED; reported that by the time he got to ED that the pain had traveled into his chest causing him to be short of breath. Patient mentioned that the ED staff talked to him about how he felt about going home and he wanted to try to come home last night and take tylenol as needed since he was better. Patient mentioned that he slept well last night due to ED giving him pain medication.   Confirmed he took the prophylactic abx, levaquin, yesterday and today and has 2 more days left.   He mentioned the ED sent scripts for nausea medication and oxycodone and he has not picked them up yet from pharmacy.   He has tolerated fluid and food today. Discussed hydrating well to make up for not drinking as much the last couple of days.   He mentioned he feels fine except when he coughs and a pain on his right side will increase to 10/10 but then comes down. Only used tylenol once today.   Cough came after the ERCP. Mentioned that this is probably from the procedure since they had to go down his throat and if the cough persists that he can refer to his OTC medication list in the green book given around 3 months. Mentioned Robitussin and Delsym is fine to take.   Also discussed that if he needs to use the oxycodone to be aware that it can cause constipation and may need miralax or colace. Mentioned hopefully he will not need oxycodone since only needed tylenol 1x today.   Verbalized understanding to get labs on Tuesday, Dec 26 (goes to [facility]) and if develops fever or severe pain for him to go to ED.   Called Dr. Imogene Burn back letting her know the above information and that patient is doing better.

## 2022-08-03 NOTE — Unmapped (Signed)
Bed: 08-A  Expected date:   Expected time:   Means of arrival:   Comments:  Rm clean , No bed

## 2022-08-03 NOTE — Unmapped (Signed)
[facility]  Emergency Department Provider Note     ED Clinical Impression     Final diagnoses:   Abdominal pain, unspecified abdominal location (Primary)      HPI, Medical Decision Making, ED Course     HPI: 61 y.o. 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  Patient states last night he was unable to sleep due to severe right upper quadrant and right lower quadrant abdominal pain.  He tried Tylenol with minimal relief.  Has had nausea but no vomiting.  Has had normal bowel movements.  Denies any melena, hematochezia, fever, dysuria, hematuria.    Initial vitals notable for tachycardia of 101 likely due to pain.  Patient is afebrile and normotensive.  Saturating well on room air.  Upon initial evaluation, patient is uncomfortable appearing.  Lungs are clear to auscultation bilaterally.  Abdomen soft, nondistended, tenderness to palpation of the right lower quadrant and right upper without rebound or guarding.    DDx/MDM: Initial differential includes but is not limited to post ERCP pancreatitis, hematoma, perforation, amongst multiple etiologies.  Will plan for basic labs, CBC, CMP, and lipase to evaluate for any signs of anemia, electrolyte abnormality, renal function, bili pathology, and pancreatitis.  Will plan for CT abdomen pelvis for further evaluation for hematoma, perforation, and other acute surgical pathology.    Diagnostic workup as below. Will treat patient with IV fluids and morphine and reassess.    Orders Placed This Encounter   Procedures    CT Abdomen Pelvis W IV Contrast    CBC w/ Differential    Comprehensive Metabolic Panel    Lipase    Inpatient consult to Biliary       ED Course  ED Course as of 08/03/22 0024   Fri Aug 02, 2022   2122 Lipase within normal limits.  CMP with reassuring electrolytes, renal function, and bilirubin.  AST of 26 and ALT elevated at 50. Alkaline phosphatase is elevated at 251.  Has been elevated in the prior labs.   2123 CBC with WBC mildly elevated 13.6, normal hemoglobin and platelets.   2158 Patient still endorsing pain to significant amount of pain we will give him another dose of morphine.   2313 CT Abdomen Pelvis W IV Contrast  CT scan independently viewed by myself and does not show any evidence of perforation or obstruction.  Formal radiology read as follows:    IMPRESSION:     Postprocedural changes from ERCP with stent placement.     Mild to moderate stranding at the pancreatic head, which can be seen with pancreatitis. Correlate with lipase and symptoms.     2334 On reexamination, patient resting comfortably in bed.  Discussed with him that symptoms are likely related to his ERCP but no complications were seen on his CT scan.  He may have early pancreatitis.  However will plan for p.o. challenge.   Sat Aug 03, 2022   0023 Patient able to tolerate clear liquids.  Discussed with patient plan to discharge with oxycodone for breakthrough pain as well as Zofran.  Gave appropriate return precautions.  She understands and is agreeable with the plan.       Discussion of Management with other Physicians, QHP, or Appropriate Source: Consultant - spoke with GI who states could be post ERCP pancreatitis but is recommending CT scan if lipase normal  Independent  Interpretation of Studies: If applicable, documented in ED Course above.  External Records Reviewed: I have reviewed recent and relevant previous record, including: Inpatient notes - discharge summary from 07/19/2022 where patient was admitted for diarrhea and transaminitis and Outpatient notes - Procedure note from yesterday for ERCP where a transpapillary plastic stent was placed in the common bile duct.  Metal stent was placed in the common hepatic duct.  Escalation of Care, Consideration of Admission/Observation/Transfer: Admission not required. Appropriate for outpatient management.      ____________________________________________    The case was discussed with the attending physician, who is in agreement with the above assessment and plan.     Additional History Elements     Chief Complaint  Chief Complaint   Patient presents with    Post-op Problem       Outside Historian(s): I have obtained additional independent history/collateral from none available.    Past Medical History:   Diagnosis Date    AKI (acute kidney injury) (CMS-HCC) 12/14/2020    Anxiety 10/22/2013    Arthritis     Cervical radiculopathy 12/03/2016    Chronic pain disorder     Lower back    Cirrhosis (CMS-HCC)     Dental abscess 10/2020    Duodenal ulcer 12/01/2017    GERD (gastroesophageal reflux disease)     History of transfusion     Hyperlipidemia 10/22/2013    Hypertension     under control with meds and weight loss    Hypotension 01/29/2022    Liver disease     Sleep apnea, obstructive     Have machine    Stroke (CMS-HCC)     mild stroke    Type 2 diabetes mellitus with diabetic neuropathy, with long-term current use of insulin (CMS-HCC) 06/09/2014       Past Surgical History:   Procedure Laterality Date    CHG X-RAY FOR BILE DUCT ENDOSCOPY  08/01/2022    Procedure: ENDOSCOPIC CATHETERIZATION OF THE BILIARY DUCTAL SYSTEM, RADIOLOGICAL SUPERVISION AND INTERPRETATION;  Surgeon: Vonda Antigua, MD;  Location: [facility];  Service: Gastroenterology    KNEE SURGERY      PR CATH PLACE/CORON ANGIO, IMG SUPER/INTERP,R&L HRT CATH, L HRT VENTRIC N/A 12/19/2020    Procedure: CATH LEFT/RIGHT HEART CATHETERIZATION;  Surgeon: Rosana Hoes, MD;  Location: [facility] CATH;  Service: Cardiology    PR COLONOSCOPY FLX DX W/COLLJ SPEC WHEN PFRMD N/A 12/07/2021    Procedure: COLONOSCOPY, FLEXIBLE, PROXIMAL TO SPLENIC FLEXURE; DIAGNOSTIC, W/WO COLLECTION SPECIMEN BY BRUSH OR WASH;  Surgeon: Annie Paras, MD;  Location: GI [facility]URES [facility];  Service: Gastroenterology    PR COLONOSCOPY W/BIOPSY SINGLE/MULTIPLE N/A 07/15/2022    Procedure: COLONOSCOPY, FLEXIBLE, PROXIMAL TO SPLENIC FLEXURE; WITH BIOPSY, SINGLE OR MULTIPLE;  Surgeon: Leland Her, MD;  Location: GI [facility]URES [facility];  Service: Gastroenterology    PR ERCP BALLOON DILATE BILIARY/PANC DUCT/AMPULLA EA N/A 08/01/2022    Procedure: ERCP;WITH TRANS-ENDOSCOPIC BALLOON DILATION OF BILIARY/PANCREATIC DUCT(S) OR OF AMPULLA, INCLUDING SPHINCTERECTOMY, WHEN PERFOREMD,EACH DUCT ([id number]);  Surgeon: Vonda Antigua, MD;  Location: [facility];  Service: Gastroenterology    PR ERCP STENT PLACEMENT BILIARY/PANCREATIC DUCT N/A 08/01/2022    Procedure: ENDOSCOPIC RETROGRADE CHOLANGIOPANCREATOGRAPHY (ERCP); WITH PLACEMENT OF ENDOSCOPIC STENT INTO BILIARY OR PANCREATIC DUCT;  Surgeon: Vonda Antigua, MD;  Location: GI [facility]URES [facility];  Service: Gastroenterology    PR ERCP,SPHINCTEROTOMY N/A 08/01/2022    Procedure: ERCP; W/SPHINCTEROTOMY/PAPILLOTOMY;  Surgeon: Vonda Antigua, MD;  Location: [facility];  Service: Gastroenterology    PR TRANSPLANT LIVER,ALLOTRANSPLANT N/A 02/06/2022    Procedure: LIVER ALLOTRANSPLANTATION; ORTHOTOPIC, PARTIAL OR WHOLE, FROM CADAVER OR LIVING DONOR, ANY AGE;  Surgeon: Florene Glen, MD;  Location: MAIN OR [place];  Service: Transplant    PR TRANSPLANT,PREP DONOR LIVER/ARTERIAL N/A 02/06/2022    Procedure: BACKBNCH RECONSTRUCT OF CAD/LIVE DONOR LIVER GFT PRIOR TRANSPLANT; ARTERIAL ANASTAMOSIS, EA;  Surgeon: Florene Glen, MD;  Location: MAIN OR [place];  Service: Transplant    PR UPPER GI ENDOSCOPY,BIOPSY N/A 10/12/2020    Procedure: UGI ENDOSCOPY; WITH BIOPSY, SINGLE OR MULTIPLE;  Surgeon: Marene Lenz, MD;  Location: GI [facility]URES [facility];  Service: Gastroenterology    PR UPPER GI ENDOSCOPY,DIAGNOSIS N/A 01/03/2022    Procedure: UGI ENDO, INCLUDE ESOPHAGUS, STOMACH, & DUODENUM &/OR JEJUNUM; DX W/WO COLLECTION SPECIMN, BY BRUSH OR WASH; Surgeon: Marene Lenz, MD;  Location: GI [facility];  Service: Gastroenterology    PR UPPER GI ENDOSCOPY,LIGAT VARIX N/A 12/07/2021    Procedure: UGI ENDO; Everlene Balls LIG ESOPH &/OR GASTRIC VARICES;  Surgeon: Annie Paras, MD;  Location: [facility];  Service: Gastroenterology    ROOT CANAL      Front teeth         Current Facility-Administered Medications:     morphine 4 mg/mL injection 4 mg, 4 mg, Intravenous, Q1H PRN, Francis Dowse, MD, 4 mg at 08/02/22 2231    Current Outpatient Medications:     acetaminophen (TYLENOL) 325 MG tablet, Take 1-2 tablets (325-650 mg total) by mouth every six (6) hours as needed for pain or fever., Disp: 100 tablet, Rfl: 11    amlodipine (NORVASC) 10 MG tablet, Take 1 tablet (10 mg total) by mouth daily., Disp: 30 tablet, Rfl: 11    aspirin (ECOTRIN) 81 MG tablet, Take 1 tablet (81 mg total) by mouth daily., Disp: 90 tablet, Rfl: 3    blood sugar diagnostic (ACCU-CHEK GUIDE TEST STRIPS) Strp, Use to check blood sugar as directed with insulin 3 times a day & for symptoms of high or low blood sugar, Disp: 100 strip, Rfl: 11    blood-glucose meter Misc, Use to test blood sugar as directed, Disp: 1 each, Rfl: 0    blood-glucose meter kit, Use as instructed, Disp: 1 each, Rfl: 0    DULoxetine (CYMBALTA) 30 MG capsule, Take 2 capsules (60 mg total) by mouth daily., Disp: 180 capsule, Rfl: 3    everolimus, immunosuppressive, (ZORTRESS) 0.5 mg tablet, Take 4 tablets (2 mg total) by mouth daily AND 2 tablets (1 mg total) nightly., Disp: 180 tablet, Rfl: 11    gabapentin (NEURONTIN) 300 MG capsule, Take 1 capsule (300 mg total) by mouth Three (3) times a day., Disp: 90 capsule, Rfl: 2    gabapentin (NEURONTIN) 300 MG capsule, Take 1 capsule (300 mg total) by mouth Three (3) times a day., Disp: , Rfl:     glucagon spray 3 mg/actuation Spry, Use 1 spray into the left nostril every fifteen (15) minutes as needed (low blood sugar)., Disp: 2 each, Rfl: 1    glucagon spray 3 mg/actuation Spry, 1 spray into each nostril continuous as needed., Disp: , Rfl:     hydrOXYzine (VISTARIL) 50 MG capsule, Take 1 capsule (50 mg total) by mouth nightly as needed for anxiety., Disp: 30 capsule, Rfl: 3    insulin ASPART (NOVOLOG FLEXPEN) 100 unit/mL (3 mL) injection pen, Inject under the skin per sliding scale prior to  meals. If premeal BG 151-200 take 2 additional units for correction, if 201-250 take 4 additional units, etc. Max dose 36 units per day.  Store in-use prefilled pens at room temperature <86??F and use within 28 days; do not refrigerate., Disp: 15 mL, Rfl: 6    insulin degludec (TRESIBA FLEXTOUCH U-100) 100 unit/mL (3 mL) InPn, Inject 0.15 mL (15 Units total) under the skin daily. Adjust as instructed., Disp: 15 mL, Rfl: 3    lancets (ACCU-CHEK SOFTCLIX LANCETS) Misc, Use to check blood sugar as directed with insulin 3 times a day & for symptoms of high or low blood sugar., Disp: 100 each, Rfl: 0    levoFLOXacin (LEVAQUIN) 500 MG tablet, Take 1 tablet (500 mg total) by mouth daily for 4 days., Disp: 4 tablet, Rfl: 0    loperamide (IMODIUM) 2 mg capsule, Take 1 capsule by mouth three times a day, end after 12/15, then take 1 capsule daily as needed for loose stools, Disp: 60 capsule, Rfl: 5    melatonin 3 mg Tab, Take 1 tablet (3 mg total) by mouth every evening., Disp: 30 tablet, Rfl: 11    methocarbamoL (ROBAXIN) 500 MG tablet, Take 1 tablet (500 mg total) by mouth Three (3) times a day as needed., Disp: 90 tablet, Rfl: 0    ondansetron (ZOFRAN-ODT) 4 MG disintegrating tablet, Take 1 tablet (4 mg total) by mouth every eight (8) hours as needed for nausea for up to 7 days., Disp: 7 tablet, Rfl: 0    oxyCODONE (ROXICODONE) 5 MG immediate release tablet, Take 1 tablet (5 mg total) by mouth every four (4) hours as needed for pain for up to 5 days., Disp: 10 tablet, Rfl: 0    pantoprazole (PROTONIX) 40 MG tablet, Take 1 tablet (40 mg total) by mouth daily as needed., Disp: 30 tablet, Rfl: 11    pen needle, diabetic (BD ULTRA-FINE NANO PEN NEEDLE) 32 gauge x 5/32 (4 mm) Ndle, Use as directed  1-2 times per day, Disp: 100 each, Rfl: 12    psyllium (METAMUCIL) 3.4 gram packet, Take 1 packet by mouth two (2) times a day., Disp: 60 each, Rfl: 11    semaglutide (OZEMPIC) 0.25 mg or 0.5 mg (2 mg/3 mL) PnIj, Inject 0.5 mg under the skin every seven (7) days. HOLD until told to resume by your coordinator (Patient not taking: Reported on 08/01/2022), Disp: 6 mL, Rfl: 11    tacrolimus (PROGRAF) 1 MG capsule, Take 3 capsules (3 mg total) by mouth daily AND 2 capsules (2 mg total) nightly., Disp: 180 capsule, Rfl: 11    ursodioL (ACTIGALL) 300 mg capsule, Take 1 capsule (300 mg total) by mouth Three (3) times a day., Disp: 90 capsule, Rfl: 11    Allergies  Venom-honey bee    Family History  Family History   Problem Relation Age of Onset    Edema Mother     Alzheimer's disease Father     Aortic dissection Brother     Early death Brother     Aneurysm Brother        Social History  Social History     Tobacco Use    Smoking status: Never    Smokeless tobacco: Never   Vaping Use    Vaping Use: Never used   Substance Use Topics    Alcohol use: Not Currently    Drug use: Not Currently        Physical Exam     VITAL SIGNS:  Vitals:    08/02/22 1850 08/02/22 1909 08/02/22 2358   BP:  132/92 150/68   Pulse: 110 101 75   Resp:  22 17   Temp:  37.1 ??C (98.7 ??F) 36.7 ??C (98.1 ??F)   TempSrc:  Oral Oral   SpO2: 97% 98% 93%       Constitutional: Alert and oriented.  Uncomfortable.  Eyes: Conjunctivae are normal.  HEENT: Normocephalic and atraumatic. Conjunctivae clear. No congestion. Moist mucous membranes.   Cardiovascular: Rate as above, regular rhythm. Normal and symmetric distal pulses. Brisk capillary refill. Normal skin turgor.  Respiratory: Normal respiratory effort. Breath sounds are normal. There are no wheezing or crackles heard.  Gastrointestinal: Soft, non-distended, tender to palpation of the right upper quadrant or right lower quadrant without rebound or guarding  Genitourinary: Deferred.  Musculoskeletal: Non-tender with normal range of motion in all extremities.  Neurologic: Normal speech and language. No gross focal neurologic deficits are appreciated. Patient is moving all extremities equally, face is symmetric at rest and with speech.  Skin: Skin is warm, dry and intact. No rash noted.  Psychiatric: Mood and affect are normal. Speech and behavior are normal.     Radiology     CT Abdomen Pelvis W IV Contrast   Final Result      Postprocedural changes from ERCP with stent placement.      Mild to moderate stranding at the pancreatic head, which can be seen with pancreatitis. Correlate with lipase and symptoms.             Pertinent labs & imaging results that were available during my care of the patient were independently interpreted by me and considered in my medical decision making (see chart for details).    Portions of this record have been created using Scientist, clinical (histocompatibility and immunogenetics). Dictation errors have been sought, but may not have been identified and corrected.        Francis Dowse, MD  Resident  08/03/22 [phone]

## 2022-08-03 NOTE — Unmapped (Addendum)
Pt had bile duct stent yesterday. Now having abdominal pain, CP, and shob. Liver transplant pt.

## 2022-08-05 DIAGNOSIS — Z5181 Encounter for therapeutic drug level monitoring: Principal | ICD-10-CM

## 2022-08-05 DIAGNOSIS — E612 Magnesium deficiency: Principal | ICD-10-CM

## 2022-08-05 DIAGNOSIS — Z944 Liver transplant status: Principal | ICD-10-CM

## 2022-08-06 NOTE — Unmapped (Signed)
The patient is requesting a medication refill

## 2022-08-06 NOTE — Unmapped (Signed)
[facility] Refill Coordination Note    Specialty Medication(s) to be Shipped:   Transplant: tacrolimus 1mg  and Zortress 0.5mg     Other medication(s) to be shipped:  test strips , amlodipine , duloxetine , gabapentin , loperamide , novolog , ozempic and pen needles      Matthew Key, DOB: Dec 04, 1960  Phone: [phone] (home) [phone] (work)      All above HIPAA information was verified with patient.     Was a Nurse, learning disability used for this call? No    Completed refill call assessment today to schedule patient's medication shipment from the [facility] [phone]).  All relevant notes have been reviewed.     Specialty medication(s) and dose(s) confirmed: Regimen is correct and unchanged.   Changes to medications: Adith reports no changes at this time.  Changes to insurance: No  New side effects reported not previously addressed with a pharmacist or physician: None reported  Questions for the pharmacist: No    Confirmed patient received a Conservation officer, historic buildings and a Surveyor, mining with first shipment. The patient will receive a drug information handout for each medication shipped and additional FDA Medication Guides as required.       DISEASE/MEDICATION-SPECIFIC INFORMATION        N/A    SPECIALTY MEDICATION ADHERENCE     Medication Adherence    Patient reported X missed doses in the last month: 0  Specialty Medication: everolimus (immunosuppressive) 0.5 mg tablet (ZORTRESS)  Patient is on additional specialty medications: Yes  Additional Specialty Medications: tacrolimus 1 MG capsule (PROGRAF)  Patient Reported Additional Medication X Missed Doses in the Last Month: 0  Patient is on more than two specialty medications: No                                Were doses missed due to medication being on hold? No    everolimus 0.5 mg: 4 days of medicine on hand   tacrolimus 1 mg: 4 days of medicine on hand       REFERRAL TO PHARMACIST     Referral to the pharmacist: Not needed      [facility]     Shipping address confirmed in Epic.     Delivery Scheduled: Yes, Expected medication delivery date: 08/08/22.     Medication will be delivered via UPS to the prescription address in Epic WAM.    [name]   [facility] Specialty Technician

## 2022-08-07 DIAGNOSIS — Z944 Liver transplant status: Principal | ICD-10-CM

## 2022-08-07 DIAGNOSIS — Z5181 Encounter for therapeutic drug level monitoring: Principal | ICD-10-CM

## 2022-08-07 DIAGNOSIS — E612 Magnesium deficiency: Principal | ICD-10-CM

## 2022-08-07 LAB — CBC W/ DIFFERENTIAL
BASOPHILS ABSOLUTE COUNT: 0 10*3/uL (ref 0.0–0.2)
BASOPHILS RELATIVE PERCENT: 0 %
EOSINOPHILS ABSOLUTE COUNT: 0.3 10*3/uL (ref 0.0–0.4)
EOSINOPHILS RELATIVE PERCENT: 4 %
HEMATOCRIT: 35.5 % — ABNORMAL LOW (ref 37.5–51.0)
HEMOGLOBIN: 11.4 g/dL — ABNORMAL LOW (ref 13.0–17.7)
LYMPHOCYTES ABSOLUTE COUNT: 1.3 10*3/uL (ref 0.7–3.1)
LYMPHOCYTES RELATIVE PERCENT: 17 %
MEAN CORPUSCULAR HEMOGLOBIN CONC: 32.1 g/dL (ref 31.5–35.7)
MEAN CORPUSCULAR HEMOGLOBIN: 26.1 pg — ABNORMAL LOW (ref 26.6–33.0)
MEAN CORPUSCULAR VOLUME: 81 fL (ref 79–97)
MONOCYTES ABSOLUTE COUNT: 0.5 10*3/uL (ref 0.1–0.9)
MONOCYTES RELATIVE PERCENT: 6 %
NEUTROPHILS ABSOLUTE COUNT: 5.7 10*3/uL (ref 1.4–7.0)
NEUTROPHILS RELATIVE PERCENT: 72 %
PLATELET COUNT: 185 10*3/uL (ref 150–450)
RED BLOOD CELL COUNT: 4.36 x10E6/uL (ref 4.14–5.80)
RED CELL DISTRIBUTION WIDTH: 13.2 % (ref 11.6–15.4)
WHITE BLOOD CELL COUNT: 7.9 10*3/uL (ref 3.4–10.8)

## 2022-08-07 LAB — PHOSPHORUS: PHOSPHORUS, SERUM: 5 mg/dL — ABNORMAL HIGH (ref 2.8–4.1)

## 2022-08-07 LAB — COMPREHENSIVE METABOLIC PANEL
A/G RATIO: 1.7 (ref 1.2–2.2)
ALBUMIN: 3.8 g/dL — ABNORMAL LOW (ref 3.9–4.9)
ALKALINE PHOSPHATASE: 215 IU/L — ABNORMAL HIGH (ref 44–121)
ALT (SGPT): 19 IU/L (ref 0–44)
AST (SGOT): 20 IU/L (ref 0–40)
BILIRUBIN TOTAL (MG/DL) IN SER/PLAS: 0.3 mg/dL (ref 0.0–1.2)
BLOOD UREA NITROGEN: 20 mg/dL (ref 8–27)
BUN / CREAT RATIO: 12 (ref 10–24)
CALCIUM: 9.2 mg/dL (ref 8.6–10.2)
CHLORIDE: 101 mmol/L (ref 96–106)
CO2: 23 mmol/L (ref 20–29)
CREATININE: 1.61 mg/dL — ABNORMAL HIGH (ref 0.76–1.27)
GLOBULIN, TOTAL: 2.2 g/dL (ref 1.5–4.5)
GLUCOSE: 180 mg/dL — ABNORMAL HIGH (ref 70–99)
POTASSIUM: 4.5 mmol/L (ref 3.5–5.2)
SODIUM: 140 mmol/L (ref 134–144)
TOTAL PROTEIN: 6 g/dL (ref 6.0–8.5)

## 2022-08-07 LAB — MAGNESIUM: MAGNESIUM: 1.6 mg/dL (ref 1.6–2.3)

## 2022-08-07 LAB — IMMATURE CELLS: METAMYLOCYTES-LABCORP: 1 % — ABNORMAL HIGH (ref 0–0)

## 2022-08-07 LAB — GAMMA GT: GAMMA GLUTAMYL TRANSFERASE: 84 IU/L — ABNORMAL HIGH (ref 0–65)

## 2022-08-07 LAB — BILIRUBIN, DIRECT: BILIRUBIN DIRECT: <0.1 mg/dL (ref 0.00–0.40)

## 2022-08-07 MED FILL — GABAPENTIN 300 MG CAPSULE: ORAL | 30 days supply | Qty: 90 | Fill #1

## 2022-08-07 MED FILL — NOVOLOG FLEXPEN U-100 INSULIN ASPART 100 UNIT/ML (3 ML) SUBCUTANEOUS: 41 days supply | Qty: 15 | Fill #3

## 2022-08-07 MED FILL — ACCU-CHEK GUIDE TEST STRIPS: 50 days supply | Qty: 200 | Fill #4

## 2022-08-07 MED FILL — ULTICARE PEN NEEDLE 32 GAUGE X 5/32" (4 MM): 50 days supply | Qty: 100 | Fill #2

## 2022-08-07 MED FILL — AMLODIPINE 10 MG TABLET: ORAL | 90 days supply | Qty: 90 | Fill #0

## 2022-08-08 LAB — EVEROLIMUS: EVEROLIMUS LEVEL: 2.6 ng/mL — ABNORMAL LOW (ref 3.0–8.0)

## 2022-08-08 LAB — TACROLIMUS LEVEL: TACROLIMUS BLOOD: 3 ng/mL (ref 2.0–20.0)

## 2022-08-08 NOTE — Unmapped (Signed)
Called patient today to let him know that his appointment would need to be cancelled and rescheduled due to his Dietitian being out. Patient indicated understanding. I emphasized that someone would call back to reschedule.     Notified front desk, transplant coordinator and post-transplant scheduler about need to cancel and reschedule appointment.     Nolon Nations, MS, RD, LD, CCTD  Heart/Lung Transplant, VAD Dietitian  pgr [phone]; ph [phone]

## 2022-08-09 NOTE — Unmapped (Signed)
Called pt and rescheduled virtual nutr appt originally scheduled for today but cancelled bc provider had family emergency. Pt said he hated to hear that about provider. Rescheduled to 08/13/22 at 10 am. He verbalized understanding.

## 2022-08-12 DIAGNOSIS — Z5181 Encounter for therapeutic drug level monitoring: Principal | ICD-10-CM

## 2022-08-12 DIAGNOSIS — Z944 Liver transplant status: Principal | ICD-10-CM

## 2022-08-12 DIAGNOSIS — E612 Magnesium deficiency: Principal | ICD-10-CM

## 2022-08-13 ENCOUNTER — Telehealth: Admit: 2022-08-13 | Discharge: 2022-08-14 | Payer: MEDICARE

## 2022-08-13 DIAGNOSIS — Z944 Liver transplant status: Principal | ICD-10-CM

## 2022-08-13 MED ORDER — EVEROLIMUS (IMMUNOSUPPRESSIVE) 0.5 MG TABLET
ORAL_TABLET | 11 refills | 0 days | Status: CP
Start: 2022-08-13 — End: ?
  Filled 2022-08-29: qty 180, 26d supply, fill #0

## 2022-08-13 NOTE — Unmapped (Signed)
Patient's 12/26 everolimus level again below goal of 3-8 and tac on cusp of lowest end of goal. Discussed with PharmD Christena Deem, who recommended increasing his Everolimus to 2mg /1.5mg  daily. Spoke to patient this morning and relayed recommendation. He verbalized understanding.

## 2022-08-13 NOTE — Unmapped (Signed)
[facility] Outpatient Nutrition Services   Medical Nutrition Therapy Consultation       Visit Type:    Return Assessment    Referral Reason: :  Liver Transplant 02/06/22      Matthew Key is a 62 y.o. male seen for medical nutrition therapy for liver transplant follow up. His active problem list, medication list and allergies were reviewed.     His interim medical history is significant for ERCP, fairly well controlled DM, weight loss.    Anthropometrics   Estimated body mass index is 29 kg/m?? as calculated from the following:    Height as of 07/29/22: 172.7 cm (5' 8).    Weight as of 07/29/22: 86.5 kg (190 lb 11.2 oz).    Wt Readings from Last 5 Encounters:   07/29/22 86.5 kg (190 lb 11.2 oz)   07/18/22 86 kg (189 lb 9.6 oz)   05/06/22 87.5 kg (193 lb)   04/08/22 86.7 kg (191 lb 1.6 oz)   04/08/22 86.7 kg (191 lb 1.6 oz)      Usual body weight: 189 lbs on home scale yesterday per patient report     ~193 lbs in September 2023    ~201 lb on home scale in June 2023   198 lbs per patient prior to transplant  Ideal Body Weight:   69.9 kg       Nutrition Risk Screening:     Nutrition Focused Physical Exam:    Unable to assess given virtual visit             Malnutrition Screening:   Malnutrition Assessment using AND/ASPEN Clinical Characteristics:     Unable to assess given inability to perform nutrition focused physical exam        Biochemical Data, Medical Tests and Procedures:  All pertinent labs and imaging reviewed by Idolina Primer, RD/LDN at 8:47 AM 08/13/2022.    Patient has not been on the [place] 14 for ~1 month, is now doing finger sticks. He reports his blood glucoses have been pretty normal, 148- 151. Sometimes is ~200 in the AM.       Lab Results   Component Value Date    Hemoglobin A1C 6.5 (H) 05/06/2022    Hemoglobin A1C 5.2 02/05/2022    Hemoglobin A1C 4.7 (L) 01/14/2022      No results found for: VITAMINA  Lab Results   Component Value Date    Vitamin D Total (25OH) 43.1 05/06/2022 Vitamin D Total (25OH) 27.9 09/11/2020    Vitamin D Total (25OH) 13.9 (L) 05/25/2019     No results found for: VITAME  No results found for: CRP  No results found for: ZINC  No results found for: COPPER    Lab Results   Component Value Date    BUN 20 08/06/2022    CREATININE 1.61 (H) 08/06/2022    GFRAA 33 (L) 03/22/2022    GFRNONAA 72 04/05/2019    NA 140 08/06/2022    K 4.5 08/06/2022    CL 101 08/06/2022    CO2 23 08/06/2022    CALCIUM 9.2 08/06/2022    PHOS 4.6 07/29/2022    ALBUMIN 4.2 08/02/2022       Medications and Vitamin/Mineral Supplementation:   All nutritionally pertinent medications reviewed on 08/13/2022.   Nutritionally pertinent medications include: novolog, tresiba, Imodium (new medication- needed pre-ERCP because of diarrhea), metamucil, Ozempic (new medication- stopped since his hospitalization for diarrhea in the beginning of December 2023), ursodiol   He  is not taking nutrition supplements.     Current Outpatient Medications   Medication Sig Dispense Refill    acetaminophen (TYLENOL) 325 MG tablet Take 1-2 tablets (325-650 mg total) by mouth every six (6) hours as needed for pain or fever. 100 tablet 11    amlodipine (NORVASC) 10 MG tablet Take 1 tablet (10 mg total) by mouth daily. 30 tablet 11    aspirin (ECOTRIN) 81 MG tablet Take 1 tablet (81 mg total) by mouth daily. 90 tablet 3    blood sugar diagnostic (ACCU-CHEK GUIDE TEST STRIPS) Strp Use to check blood sugar as directed with insulin 3 times a day & for symptoms of high or low blood sugar 100 strip 11    blood-glucose meter Misc Use to test blood sugar as directed 1 each 0    blood-glucose meter kit Use as instructed 1 each 0    DULoxetine (CYMBALTA) 30 MG capsule Take 2 capsules (60 mg total) by mouth daily. 180 capsule 3    everolimus, immunosuppressive, (ZORTRESS) 0.5 mg tablet Take 4 tablets (2 mg total) by mouth daily AND 2 tablets (1 mg total) nightly. 180 tablet 11    gabapentin (NEURONTIN) 300 MG capsule Take 1 capsule (300 mg total) by mouth Three (3) times a day. 90 capsule 2    gabapentin (NEURONTIN) 300 MG capsule Take 1 capsule (300 mg total) by mouth Three (3) times a day.      glucagon spray 3 mg/actuation Spry Use 1 spray into the left nostril every fifteen (15) minutes as needed (low blood sugar). 2 each 1    glucagon spray 3 mg/actuation Spry 1 spray into each nostril continuous as needed.      hydrOXYzine (VISTARIL) 50 MG capsule Take 1 capsule (50 mg total) by mouth nightly as needed for anxiety. 30 capsule 3    insulin aspart (NOVOLOG FLEXPEN) 100 unit/mL (3 mL) injection pen Inject under the skin per sliding scale prior to meals. If premeal BG 151-200 take 2 additional units for correction, if 201-250 take 4 additional units, etc. Max dose 36 units per day.  Store in-use prefilled pens at room temperature <86??F and use within 28 days; do not refrigerate. 15 mL 6    insulin degludec (TRESIBA FLEXTOUCH U-100) 100 unit/mL (3 mL) InPn Inject 0.15 mL (15 Units total) under the skin daily. Adjust as instructed. 15 mL 3    lancets (ACCU-CHEK SOFTCLIX LANCETS) Misc Use to check blood sugar as directed with insulin 3 times a day & for symptoms of high or low blood sugar. 100 each 0    loperamide (IMODIUM) 2 mg capsule Take 1 capsule by mouth three times a day, end after 12/15, then take 1 capsule daily as needed for loose stools 60 capsule 5    melatonin 3 mg Tab Take 1 tablet (3 mg total) by mouth every evening. 30 tablet 11    methocarbamoL (ROBAXIN) 500 MG tablet Take 1 tablet (500 mg total) by mouth Three (3) times a day as needed. 90 tablet 0    pantoprazole (PROTONIX) 40 MG tablet Take 1 tablet (40 mg total) by mouth daily as needed. 30 tablet 11    pen needle, diabetic (BD ULTRA-FINE NANO PEN NEEDLE) 32 gauge x 5/32 (4 mm) Ndle Use as directed  1-2 times per day 100 each 12    psyllium (METAMUCIL) 3.4 gram packet Take 1 packet by mouth two (2) times a day. 60 each 11    semaglutide (OZEMPIC) 0.25  mg or 0.5 mg (2 mg/3 mL) PnIj Inject 0.5 mg under the skin every seven (7) days. HOLD until told to resume by your coordinator (Patient not taking: Reported on 08/01/2022) 6 mL 11    tacrolimus (PROGRAF) 1 MG capsule Take 3 capsules (3 mg total) by mouth daily AND 2 capsules (2 mg total) nightly. 180 capsule 11    ursodioL (ACTIGALL) 300 mg capsule Take 1 capsule (300 mg total) by mouth Three (3) times a day. 90 capsule 11     No current facility-administered medications for this visit.       Nutrition History:     Dietary Restrictions: No known food allergies or food intolerances.     Gastrointestinal Issues: Denied issues   Patient reports being started on Imodium and fiber before the ERCP. Patient reports having normal BM's now - 3x per day and does not think he needs any meds for his GI tract. He reports his abdominal pain has improved.     Hunger and Satiety: Poor appetite and limited intake.   Early satiety.   Patient reports having a good appetite now that he is a few weeks out from ERCP.    Food Safety and Access: No to little issues noted.     Diet Recall:    Time Intake   Breakfast Boost Original   Cereal + milk and egg and sausage or oatmeal    Snack (AM)    Lunch Protein + vegetable (usually lima beans or green beans)   Snack (PM)    Dinner Cheeseburger and spinach salad     Snack (HS)      Food-Related History:     Beverages: 3-4 bottles of water per day, Boost Original - Chocolate when needed  Dining Out:  Minimal   Usual Food Choices: peas, green beans, peanut butter, lima beans, green beans, milk, macaroni & cheese, chicken, brown rice   Does not eat fruit per his report     Physical Activity:  Physical activity level is sedentary with little to no exercise.    He has been trying to walk more since his ERCP. He has not been able to exercise as much recently with the pain     Daily Estimated Nutrient Needs:  Energy: 1750- 2100 kcals [25- 30 kcal/kg using ideal body weight, 70 kg]    Protein: 70- 85 gm [1.0- 1.2 gm/kg using ideal body weight, 70 kg]  Fluid:   [per MD team]  Sodium:  <2000 mg     Nutrition Goals & Evaluation      1. Meet estimated daily needs (Progressing)  2. Normal vitamin levels (Ongoing)  3. Balanced macronutrient intake (Progressing)  4. Hemoglobin A1c <7% (Met)    Nutrition goals reviewed, and relevant barriers identified and addressed: none evident. He is evaluated to have good willingness and ability to achieve nutrition goals.     Nutrition Assessment     Per the patient's diet recall, nutrition intake is inadequate but is improving and is close to meeting estimated nutrition needs. Patient has been able to maintain his weight for the past few weeks per records. Patient is recently post ERCP. Since then he has increased his appetite and is no longer having diarrhea. Patient's intake of Boost shakes as decreased appropriately since last visit, and discussed that he would only really need Boost if he skips a meal or does not feel well and appetite decreases. Patient amenable to recommendations.     Patient with slightly  worse glucose control since having his [place] 14 removed last month during admission. Communicated with team re: potential re-initiation of Ozempic.     Nutrition Intervention      Nutrition Education: blood glucose management    Materials Provided were: verbal tips and suggestions     Nutrition Plan:    Consume 1 oral nutrition supplement daily only if not eating normally   Eat 4 small, frequent protein containing meals per day   Space meals out every 3-4 hours while awake  Increase exercise/safe movement to 30 minutes, 5 days per week   Communicated with pharmacist and [facility] re: utility of restarting Ozempic     Follow up will occur at 12 months post transplant.     Food/Nutrition-related history and Biochemical data, medical tests, procedures will be assessed at time of follow-up.         Recommendations for Care Team:    Monitor blood glucose levels          The patient reports they are currently: at home. I spent 44 minutes on the real-time audio and video visit with the patient on the date of service. I spent an additional 28  minutes on pre- and post-visit activities on the date of service.     The patient was not located and I was located within 250 yards of a hospital based location during the real-time audio and video visit. The patient was physically located in [place] or a state in which I am permitted to provide care. The patient and/or parent/guardian understood that s/he may incur co-pays and cost sharing, and agreed to the telemedicine visit. The visit was reasonable and appropriate under the circumstances given the patient's presentation at the time.    The patient and/or parent/guardian has been advised of the potential risks and limitations of this mode of treatment (including, but not limited to, the absence of in-person examination) and has agreed to be treated using telemedicine. The patient's/patient's family's questions regarding telemedicine have been answered.    If the visit was completed in an ambulatory setting, the patient and/or parent/guardian has also been advised to contact their provider???s office for worsening conditions, and seek emergency medical treatment and/or call 911 if the patient deems either necessary.      Lanelle Bal, RD, LDN  Abdominal Transplant Dietitian   Pager: [phone]

## 2022-08-14 DIAGNOSIS — Z944 Liver transplant status: Principal | ICD-10-CM

## 2022-08-14 DIAGNOSIS — Z5181 Encounter for therapeutic drug level monitoring: Principal | ICD-10-CM

## 2022-08-14 DIAGNOSIS — E612 Magnesium deficiency: Principal | ICD-10-CM

## 2022-08-14 LAB — CBC W/ DIFFERENTIAL
BASOPHILS ABSOLUTE COUNT: 0.1 10*3/uL (ref 0.0–0.2)
BASOPHILS RELATIVE PERCENT: 1 %
EOSINOPHILS ABSOLUTE COUNT: 0.1 10*3/uL (ref 0.0–0.4)
EOSINOPHILS RELATIVE PERCENT: 1 %
HEMATOCRIT: 36.9 % — ABNORMAL LOW (ref 37.5–51.0)
HEMOGLOBIN: 11.7 g/dL — ABNORMAL LOW (ref 13.0–17.7)
LYMPHOCYTES ABSOLUTE COUNT: 1.9 10*3/uL (ref 0.7–3.1)
LYMPHOCYTES RELATIVE PERCENT: 24 %
MEAN CORPUSCULAR HEMOGLOBIN CONC: 31.7 g/dL (ref 31.5–35.7)
MEAN CORPUSCULAR HEMOGLOBIN: 25.7 pg — ABNORMAL LOW (ref 26.6–33.0)
MEAN CORPUSCULAR VOLUME: 81 fL (ref 79–97)
MONOCYTES ABSOLUTE COUNT: 0.5 10*3/uL (ref 0.1–0.9)
MONOCYTES RELATIVE PERCENT: 6 %
NEUTROPHILS ABSOLUTE COUNT: 5.2 10*3/uL (ref 1.4–7.0)
NEUTROPHILS RELATIVE PERCENT: 65 %
PLATELET COUNT: 206 10*3/uL (ref 150–450)
RED BLOOD CELL COUNT: 4.55 x10E6/uL (ref 4.14–5.80)
RED CELL DISTRIBUTION WIDTH: 13.5 % (ref 11.6–15.4)
WHITE BLOOD CELL COUNT: 7.8 10*3/uL (ref 3.4–10.8)

## 2022-08-14 LAB — BILIRUBIN, DIRECT: BILIRUBIN DIRECT: 0.17 mg/dL (ref 0.00–0.40)

## 2022-08-14 LAB — IMMATURE CELLS
BANDS: 2 %
METAMYLOCYTES-LABCORP: 1 % — ABNORMAL HIGH (ref 0–0)

## 2022-08-14 LAB — PHOSPHORUS: PHOSPHORUS, SERUM: 4.3 mg/dL — ABNORMAL HIGH (ref 2.8–4.1)

## 2022-08-14 LAB — GAMMA GT: GAMMA GLUTAMYL TRANSFERASE: 334 IU/L — ABNORMAL HIGH (ref 0–65)

## 2022-08-14 LAB — COMPREHENSIVE METABOLIC PANEL
A/G RATIO: 1.5 (ref 1.2–2.2)
ALBUMIN: 3.7 g/dL — ABNORMAL LOW (ref 3.9–4.9)
ALKALINE PHOSPHATASE: 466 IU/L — ABNORMAL HIGH (ref 44–121)
ALT (SGPT): 171 IU/L — ABNORMAL HIGH (ref 0–44)
AST (SGOT): 51 IU/L — ABNORMAL HIGH (ref 0–40)
BILIRUBIN TOTAL (MG/DL) IN SER/PLAS: 0.4 mg/dL (ref 0.0–1.2)
BLOOD UREA NITROGEN: 15 mg/dL (ref 8–27)
BUN / CREAT RATIO: 11 (ref 10–24)
CALCIUM: 9.3 mg/dL (ref 8.6–10.2)
CHLORIDE: 99 mmol/L (ref 96–106)
CO2: 27 mmol/L (ref 20–29)
CREATININE: 1.33 mg/dL — ABNORMAL HIGH (ref 0.76–1.27)
GLOBULIN, TOTAL: 2.4 g/dL (ref 1.5–4.5)
GLUCOSE: 174 mg/dL — ABNORMAL HIGH (ref 70–99)
POTASSIUM: 5.1 mmol/L (ref 3.5–5.2)
SODIUM: 139 mmol/L (ref 134–144)
TOTAL PROTEIN: 6.1 g/dL (ref 6.0–8.5)

## 2022-08-14 LAB — TACROLIMUS LEVEL: TACROLIMUS BLOOD: 13.7 ng/mL (ref 2.0–20.0)

## 2022-08-14 LAB — MAGNESIUM: MAGNESIUM: 1.5 mg/dL — ABNORMAL LOW (ref 1.6–2.3)

## 2022-08-14 NOTE — Unmapped (Incomplete)
Patient's 1/2 labs resulted with elevated lfts. Spoke with patient yesterday, who offered no complaints, but returned call today to discuss it further. He continued to report he felt well and  denied n/v/d/fever or chills. His ERCP was done on 12/21 and his abx therapy and xifaxan were completed. Discussed decision by PharmD Chargualaf, to restart his ozempic 0.25 mg weekly x 4 weeks with plan to f/up with virtual visit in 4 weeks. Reached out to Dr.Moon with lab concerns.

## 2022-08-15 LAB — EVEROLIMUS: EVEROLIMUS LEVEL: 13 ng/mL — ABNORMAL HIGH (ref 3.0–8.0)

## 2022-08-15 NOTE — Unmapped (Addendum)
[facility] Pharmacist has reviewed a new prescription for everolimus that indicates a dose increase.  Patient was counseled on this dosage change by [facility]- see epic note from 08/13/22.  Next refill call date adjusted if necessary.        Clinical Assessment Needed For: Dose Change  Medication: Everolimus 0.5mg  tablet  Last Fill Date/Day Supply: 08/07/2022 / 30 days  Refill Too Soon until 08/27/2022  Was previous dose already scheduled to fill: No    Notes to Pharmacist: Will re-test on 01/16

## 2022-08-16 ENCOUNTER — Ambulatory Visit: Admit: 2022-08-16 | Discharge: 2022-08-17 | Payer: MEDICARE

## 2022-08-16 DIAGNOSIS — Z944 Liver transplant status: Principal | ICD-10-CM

## 2022-08-16 DIAGNOSIS — Z5181 Encounter for therapeutic drug level monitoring: Principal | ICD-10-CM

## 2022-08-16 DIAGNOSIS — E612 Magnesium deficiency: Principal | ICD-10-CM

## 2022-08-16 DIAGNOSIS — R109 Unspecified abdominal pain: Principal | ICD-10-CM

## 2022-08-16 DIAGNOSIS — E0865 Diabetes mellitus due to underlying condition with hyperglycemia: Principal | ICD-10-CM

## 2022-08-16 DIAGNOSIS — Z289 Immunization not carried out for unspecified reason: Principal | ICD-10-CM

## 2022-08-16 DIAGNOSIS — Z794 Long term (current) use of insulin: Principal | ICD-10-CM

## 2022-08-16 DIAGNOSIS — K760 Fatty (change of) liver, not elsewhere classified: Principal | ICD-10-CM

## 2022-08-16 LAB — CBC W/ AUTO DIFF
BASOPHILS ABSOLUTE COUNT: 0.1 10*9/L (ref 0.0–0.1)
BASOPHILS RELATIVE PERCENT: 0.8 %
EOSINOPHILS ABSOLUTE COUNT: 0.3 10*9/L (ref 0.0–0.5)
EOSINOPHILS RELATIVE PERCENT: 3.2 %
HEMATOCRIT: 36.5 % — ABNORMAL LOW (ref 39.0–48.0)
HEMOGLOBIN: 12 g/dL — ABNORMAL LOW (ref 12.9–16.5)
LYMPHOCYTES ABSOLUTE COUNT: 1.2 10*9/L (ref 1.1–3.6)
LYMPHOCYTES RELATIVE PERCENT: 14.6 %
MEAN CORPUSCULAR HEMOGLOBIN CONC: 32.7 g/dL (ref 32.0–36.0)
MEAN CORPUSCULAR HEMOGLOBIN: 26 pg (ref 25.9–32.4)
MEAN CORPUSCULAR VOLUME: 79.5 fL (ref 77.6–95.7)
MEAN PLATELET VOLUME: 9.3 fL (ref 6.8–10.7)
MONOCYTES ABSOLUTE COUNT: 0.8 10*9/L (ref 0.3–0.8)
MONOCYTES RELATIVE PERCENT: 10.4 %
NEUTROPHILS ABSOLUTE COUNT: 5.8 10*9/L (ref 1.8–7.8)
NEUTROPHILS RELATIVE PERCENT: 71 %
PLATELET COUNT: 216 10*9/L (ref 150–450)
RED BLOOD CELL COUNT: 4.6 10*12/L (ref 4.26–5.60)
RED CELL DISTRIBUTION WIDTH: 14.6 % (ref 12.2–15.2)
WBC ADJUSTED: 8.2 10*9/L (ref 3.6–11.2)

## 2022-08-16 LAB — URINALYSIS WITH MICROSCOPY
BACTERIA: NONE SEEN /HPF
BILIRUBIN UA: NEGATIVE
BLOOD UA: NEGATIVE
GLUCOSE UA: 70 — AB
KETONES UA: NEGATIVE
LEUKOCYTE ESTERASE UA: NEGATIVE
NITRITE UA: NEGATIVE
PH UA: 5 (ref 5.0–9.0)
PROTEIN UA: 50 — AB
RBC UA: <1 /HPF (ref <=3)
SPECIFIC GRAVITY UA: 1.016 (ref 1.003–1.030)
SQUAMOUS EPITHELIAL: <1 /HPF (ref 0–5)
UROBILINOGEN UA: <2
WBC UA: 1 /HPF (ref <=2)

## 2022-08-16 LAB — LIPID PANEL
CHOLESTEROL/HDL RATIO SCREEN: 6.4 — ABNORMAL HIGH (ref 1.0–4.5)
CHOLESTEROL: 193 mg/dL (ref <=200)
HDL CHOLESTEROL: 30 mg/dL — ABNORMAL LOW (ref 40–60)
LDL CHOLESTEROL CALCULATED: 121 mg/dL — ABNORMAL HIGH (ref 40–99)
NON-HDL CHOLESTEROL: 163 mg/dL — ABNORMAL HIGH (ref 70–130)
TRIGLYCERIDES: 210 mg/dL — ABNORMAL HIGH (ref 0–150)
VLDL CHOLESTEROL CAL: 42 mg/dL (ref 12–42)

## 2022-08-16 LAB — COMPREHENSIVE METABOLIC PANEL
ALBUMIN: 3.5 g/dL (ref 3.4–5.0)
ALKALINE PHOSPHATASE: 326 U/L — ABNORMAL HIGH (ref 46–116)
ALT (SGPT): 81 U/L — ABNORMAL HIGH (ref 10–49)
ANION GAP: 5 mmol/L (ref 5–14)
AST (SGOT): 15 U/L (ref <=34)
BILIRUBIN TOTAL: 0.4 mg/dL (ref 0.3–1.2)
BLOOD UREA NITROGEN: 22 mg/dL (ref 9–23)
BUN / CREAT RATIO: 16
CALCIUM: 9.2 mg/dL (ref 8.7–10.4)
CHLORIDE: 105 mmol/L (ref 98–107)
CO2: 28 mmol/L (ref 20.0–31.0)
CREATININE: 1.34 mg/dL — ABNORMAL HIGH
EGFR CKD-EPI (2021) MALE: 60 mL/min/{1.73_m2} (ref >=60)
GLUCOSE RANDOM: 223 mg/dL — ABNORMAL HIGH (ref 70–99)
POTASSIUM: 4.5 mmol/L (ref 3.4–4.8)
PROTEIN TOTAL: 7.1 g/dL (ref 5.7–8.2)
SODIUM: 138 mmol/L (ref 135–145)

## 2022-08-16 LAB — PHOSPHORUS: PHOSPHORUS: 4.4 mg/dL (ref 2.4–5.1)

## 2022-08-16 LAB — PROTEIN / CREATININE RATIO, URINE
CREATININE, URINE: 144.3 mg/dL
PROTEIN URINE: 62.4 mg/dL
PROTEIN/CREAT RATIO, URINE: 0.432

## 2022-08-16 LAB — TACROLIMUS LEVEL, TROUGH: TACROLIMUS, TROUGH: 4.8 ng/mL — ABNORMAL LOW (ref 5.0–15.0)

## 2022-08-16 LAB — SLIDE REVIEW

## 2022-08-16 LAB — MAGNESIUM: MAGNESIUM: 1.5 mg/dL — ABNORMAL LOW (ref 1.6–2.6)

## 2022-08-16 LAB — CMV DNA, QUANTITATIVE, PCR: CMV VIRAL LD: NOT DETECTED

## 2022-08-16 LAB — BILIRUBIN, DIRECT: BILIRUBIN DIRECT: 0.2 mg/dL (ref 0.00–0.30)

## 2022-08-16 LAB — HEMOGLOBIN A1C
ESTIMATED AVERAGE GLUCOSE: 169 mg/dL
HEMOGLOBIN A1C: 7.5 % — ABNORMAL HIGH (ref 4.8–5.6)

## 2022-08-16 LAB — GAMMA GT: GAMMA GLUTAMYL TRANSFERASE: 266 U/L — ABNORMAL HIGH

## 2022-08-16 NOTE — Unmapped (Addendum)
[facility] LIVER CENTER  [facility]  [address]  [place], [place] [address]  Main Clinic: [phone]  Appointment Schedulers: [phone]  Theophilus Kinds, RN: [phone]  Fax: [phone]       Thank you for allowing me to participate in your medical care today. Here are my recommendations based on today's visit:    1) Get the x-ray of your belly at radiology that we have ordered    2) If you develop any fevers, chills, or belly pain, please let us know, because we will need to get labs and may start antibiotics.    3) Vernona Rieger will coordinate to get labs on you early next week    Take care,  Stephannie Li, MD

## 2022-08-16 NOTE — Unmapped (Addendum)
[address] HEPATOLOGY FOLLOW-UP CLINIC NOTE    REFERRING PROVIDER: Referring, Info Unavail*  PRIMARY CARE PROVIDER: [name], MD    DATE OF SERVICE: 08/16/2022    Subjective   SUBJECTIVE:     CHIEF COMPLAINT: follow-up for routine cirrhosis care    HISTORY OF PRESENT ILLNESS:      Matthew Key is a 62 y.o. 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    INTERVAL HISTORY:   Last saw Dr. Imogene Burn 07/29/22. He had recently been admitted with diarrhea and elevated liver enzymes, and MRCP w/intrahepatic biliary dilation, so underwent ERCP 12/21 that showed a post-surgical anastomotic stricture, plastic stent into CBD, covered metal stent in CHD. His liver enzymes normalized to AST 20, ALT 19, ALP 215, but have now risen again as of 08/13/22 AST 51, ALT 171, ALP 466.     Restarted his ozempic recently     On 1/2, his tac was low end of goal and everolimus low (goal 3-8), so increased his everolimus to 2mg /1.5mg .     He is having some back pain. This is intermittent in nature, and has been ongoing for years. R side to middle of back.     No fevers or chills, No abdominal pain aside from mile soreness at scar site, which has been ongoing but improving. No nausea, vomiting, or diarrhea. His diarrhea has resolved, and stopped taking the immodium. The last few days he has had some loose bowel movements. He had 4 Bms yesterday, soft but not liquid, and only a small amount. His normal/baseline is at least 2 per day so this isn't far from his normal.     Wt Readings from Last 6 Encounters:   08/16/22 86.5 kg (190 lb 11.2 oz)   07/29/22 86.5 kg (190 lb 11.2 oz)   07/18/22 86 kg (189 lb 9.6 oz)   05/06/22 87.5 kg (193 lb)   04/08/22 86.7 kg (191 lb 1.6 oz)   04/08/22 86.7 kg (191 lb 1.6 oz)     REVIEW OF SYSTEMS:   The balance of 12 systems reviewed is negative except as noted in the HPI.     PAST MEDICAL HISTORY:  Past Medical History:   Diagnosis Date    AKI (acute kidney injury) (CMS-HCC) 12/14/2020    Anxiety 10/22/2013    Arthritis     Cervical radiculopathy 12/03/2016    Chronic pain disorder     Lower back    Cirrhosis (CMS-HCC)     Dental abscess 10/2020    Duodenal ulcer 12/01/2017    GERD (gastroesophageal reflux disease)     History of transfusion     Hyperlipidemia 10/22/2013    Hypertension     under control with meds and weight loss    Hypotension 01/29/2022    Liver disease     Sleep apnea, obstructive     Have machine    Stroke (CMS-HCC)     mild stroke    Type 2 diabetes mellitus with diabetic neuropathy, with long-term current use of insulin (CMS-HCC) 06/09/2014       MEDICATIONS:   Current Outpatient Medications   Medication Sig Dispense Refill    acetaminophen (TYLENOL) 325 MG tablet Take 1-2 tablets (325-650 mg total) by mouth every six (6) hours as needed for pain or fever. 100 tablet 11  amlodipine (NORVASC) 10 MG tablet Take 1 tablet (10 mg total) by mouth daily. 30 tablet 11    aspirin (ECOTRIN) 81 MG tablet Take 1 tablet (81 mg total) by mouth daily. 90 tablet 3    blood sugar diagnostic (ACCU-CHEK GUIDE TEST STRIPS) Strp Use to check blood sugar as directed with insulin 3 times a day & for symptoms of high or low blood sugar 100 strip 11    blood-glucose meter Misc Use to test blood sugar as directed 1 each 0    blood-glucose meter kit Use as instructed 1 each 0    DULoxetine (CYMBALTA) 30 MG capsule Take 2 capsules (60 mg total) by mouth daily. 180 capsule 3    everolimus, immunosuppressive, (ZORTRESS) 0.5 mg tablet Take 4 tablets (2 mg total) by mouth every morning AND 3 tablets (1.5 mg total) every evening. 210 tablet 11    gabapentin (NEURONTIN) 300 MG capsule Take 1 capsule (300 mg total) by mouth Three (3) times a day. 90 capsule 2    gabapentin (NEURONTIN) 300 MG capsule Take 1 capsule (300 mg total) by mouth Three (3) times a day. glucagon spray 3 mg/actuation Spry Use 1 spray into the left nostril every fifteen (15) minutes as needed (low blood sugar). 2 each 1    glucagon spray 3 mg/actuation Spry 1 spray into each nostril continuous as needed.      hydrOXYzine (VISTARIL) 50 MG capsule Take 1 capsule (50 mg total) by mouth nightly as needed for anxiety. 30 capsule 3    insulin aspart (NOVOLOG FLEXPEN) 100 unit/mL (3 mL) injection pen Inject under the skin per sliding scale prior to meals. If premeal BG 151-200 take 2 additional units for correction, if 201-250 take 4 additional units, etc. Max dose 36 units per day.  Store in-use prefilled pens at room temperature <86??F and use within 28 days; do not refrigerate. 15 mL 6    insulin degludec (TRESIBA FLEXTOUCH U-100) 100 unit/mL (3 mL) InPn Inject 0.15 mL (15 Units total) under the skin daily. Adjust as instructed. 15 mL 3    lancets (ACCU-CHEK SOFTCLIX LANCETS) Misc Use to check blood sugar as directed with insulin 3 times a day & for symptoms of high or low blood sugar. 100 each 0    loperamide (IMODIUM) 2 mg capsule Take 1 capsule by mouth three times a day, end after 12/15, then take 1 capsule daily as needed for loose stools 60 capsule 5    melatonin 3 mg Tab Take 1 tablet (3 mg total) by mouth every evening. 30 tablet 11    methocarbamoL (ROBAXIN) 500 MG tablet Take 1 tablet (500 mg total) by mouth Three (3) times a day as needed. 90 tablet 0    pantoprazole (PROTONIX) 40 MG tablet Take 1 tablet (40 mg total) by mouth daily as needed. 30 tablet 11    pen needle, diabetic (BD ULTRA-FINE NANO PEN NEEDLE) 32 gauge x 5/32 (4 mm) Ndle Use as directed  1-2 times per day 100 each 12    psyllium (METAMUCIL) 3.4 gram packet Take 1 packet by mouth two (2) times a day. 60 each 11    semaglutide (OZEMPIC) 0.25 mg or 0.5 mg (2 mg/3 mL) PnIj Inject 0.5 mg under the skin every seven (7) days. HOLD until told to resume by your coordinator (Patient not taking: Reported on 08/01/2022) 6 mL 11 tacrolimus (PROGRAF) 1 MG capsule Take 3 capsules (3 mg total) by mouth daily AND 2 capsules (2  mg total) nightly. 180 capsule 11    ursodioL (ACTIGALL) 300 mg capsule Take 1 capsule (300 mg total) by mouth Three (3) times a day. 90 capsule 11     No current facility-administered medications for this visit.       ALLERGIES:   Venom-honey bee       Objective   OBJECTIVE:   VITAL SIGNS: BP 140/83  - Pulse 73  - Temp 36.6 ??C (97.9 ??F)  - Resp 16  - Ht 172.7 cm (5' 8)  - Wt 86.5 kg (190 lb 11.2 oz)  - SpO2 99%  - BMI 29.00 kg/m??        PHYSICAL EXAM:  Gen: Well  appearing male in NAD, alert and oriented  Abd: Scar well-healed, nontender to deep palpation  Back:some tenderness to palpation at R flank, no point tenderness on spine    DIAGNOSTIC STUDIES:  I have reviewed all pertinent diagnostic studies, including:  Laboratory results:  Lab Results   Component Value Date    WBC 8.2 08/16/2022    HGB 12.0 (L) 08/16/2022    MCV 79.5 08/16/2022    PLT 216 08/16/2022    Lab Results   Component Value Date    NA 138 08/16/2022    K 4.5 08/16/2022    CL 105 08/16/2022    CREATININE 1.34 (H) 08/16/2022    GLU 223 (H) 08/16/2022    CALCIUM 9.2 08/16/2022      Lab Results   Component Value Date    ALBUMIN 3.5 08/16/2022    AST 15 08/16/2022    ALT 81 (H) 08/16/2022    ALKPHOS 326 (H) 08/16/2022    BILITOT 0.4 08/16/2022    Lab Results   Component Value Date    INR 0.96 07/10/2022          ASSESSMENT AND PLAN:     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 Everolimus has been started in s/o diabetes to minimize tacrolimus burden. His course has recently been complicated by elevated liver enzymes with a liver biopsy negative for rejection (07/15/22), and ERCP w/biliary stricture now s/p covered metal stent and plastic stent with improvement in liver enzymes.     Elevated liver enzymes: Suspect elevated liver enzymes due to biliary stricture, which was below the level of the anastomosis and had substantial improvement s/p stenting. Unclear why it recently rose but has already improved by today.  - Abdominal X-ray to monitor for possible stent migration  - Close interval lab follow up  - If any pain, fever, etc, patient will call us and may consider adding antibiotics at that time (5 days levoflox), but will hold off for now.   - Consider stopping ursodiol in the future after this episode resolves    Immunosuppresion: On combo tac/everolimus due to DM to minimiz tac burden.   - Tac goal: 3-5, currently on tac 3/2  - Everolimus goal: 3-8, currently on 2/1.5  - Tac level pending today  - Get everolimus trough with next set of labs.     Diarrhea - largely resolved: Had a colonoscopy with biopsies 07/16/22 that was negative for microscopic colitis but demonstrated non-specific inflammatory infiltrate and eosinophils (though eos are more common as you get more distal in the GI tract). Currently has some looser Bms, but not severe diarrhea like when was hospitalized. It is  interesting that elevated liver enzymes seem to coincide with episodes of diarrhea.  - Continue to monitor, can continue immodium PRN  - If worsens, will let us know and may repeat stool studies.   - Consider ursodiol discontinuation in future as this can worsen GI symptoms    #Preventive Hepatology Care:  - Colonoscopy: UTD, next due 2033  - Flu shot: UTD  - COVID: Give booster today  - Recommend dermatology evaluation annually due to risk of skin cancers with IS    I personally spent 50 minutes face-to-face and non-face-to-face in the care of this patient, which includes all pre, intra, and post visit time on the date of service. I also discussed this patient's care with specialists from gastroenterology and transplant surgery.    Tawni Carnes, MD, MPH  Assistant Professor of Medicine  [facility]  [facility]  Division of Gastroenterology and Hepatology

## 2022-08-16 NOTE — Unmapped (Signed)
Patient's kub reviewed by Dr.Moon, who noted that all stents appeared to be in the correct place. Since today's labs show improvement, will continue to monitor weekly at this point. Notified patient, who verbalized understanding.

## 2022-08-16 NOTE — Unmapped (Signed)
COVID vaccine administered in L deltoid, patient tolerated well. Verbal and written education provided. S/s discussed. Patient instructed to self monitor for 20 minutes after injection to monitor for s/s of an allergic reaction, s/s discussed, patient verbalizes understanding.

## 2022-08-19 DIAGNOSIS — E612 Magnesium deficiency: Principal | ICD-10-CM

## 2022-08-19 DIAGNOSIS — Z5181 Encounter for therapeutic drug level monitoring: Principal | ICD-10-CM

## 2022-08-19 DIAGNOSIS — Z944 Liver transplant status: Principal | ICD-10-CM

## 2022-08-19 NOTE — Unmapped (Signed)
TRF

## 2022-08-20 LAB — BILIRUBIN, DIRECT: BILIRUBIN DIRECT: 0.12 mg/dL (ref 0.00–0.40)

## 2022-08-20 LAB — COMPREHENSIVE METABOLIC PANEL
A/G RATIO: 1.4 (ref 1.2–2.2)
ALBUMIN: 3.9 g/dL (ref 3.9–4.9)
ALKALINE PHOSPHATASE: 292 IU/L — ABNORMAL HIGH (ref 44–121)
ALT (SGPT): 31 IU/L (ref 0–44)
AST (SGOT): 16 IU/L (ref 0–40)
BILIRUBIN TOTAL (MG/DL) IN SER/PLAS: 0.3 mg/dL (ref 0.0–1.2)
BLOOD UREA NITROGEN: 22 mg/dL (ref 8–27)
BUN / CREAT RATIO: 14 (ref 10–24)
CALCIUM: 9.3 mg/dL (ref 8.6–10.2)
CHLORIDE: 101 mmol/L (ref 96–106)
CO2: 22 mmol/L (ref 20–29)
CREATININE: 1.54 mg/dL — ABNORMAL HIGH (ref 0.76–1.27)
GLOBULIN, TOTAL: 2.7 g/dL (ref 1.5–4.5)
GLUCOSE: 178 mg/dL — ABNORMAL HIGH (ref 70–99)
POTASSIUM: 5.1 mmol/L (ref 3.5–5.2)
SODIUM: 138 mmol/L (ref 134–144)
TOTAL PROTEIN: 6.6 g/dL (ref 6.0–8.5)

## 2022-08-20 LAB — CBC W/ DIFFERENTIAL
BASOPHILS ABSOLUTE COUNT: 0 10*3/uL (ref 0.0–0.2)
BASOPHILS RELATIVE PERCENT: 0 %
EOSINOPHILS ABSOLUTE COUNT: 0.3 10*3/uL (ref 0.0–0.4)
EOSINOPHILS RELATIVE PERCENT: 5 %
HEMATOCRIT: 35.4 % — ABNORMAL LOW (ref 37.5–51.0)
HEMOGLOBIN: 10.8 g/dL — ABNORMAL LOW (ref 13.0–17.7)
LYMPHOCYTES ABSOLUTE COUNT: 1.8 10*3/uL (ref 0.7–3.1)
LYMPHOCYTES RELATIVE PERCENT: 27 %
MEAN CORPUSCULAR HEMOGLOBIN CONC: 30.5 g/dL — ABNORMAL LOW (ref 31.5–35.7)
MEAN CORPUSCULAR HEMOGLOBIN: 24.8 pg — ABNORMAL LOW (ref 26.6–33.0)
MEAN CORPUSCULAR VOLUME: 81 fL (ref 79–97)
MONOCYTES ABSOLUTE COUNT: 0.7 10*3/uL (ref 0.1–0.9)
MONOCYTES RELATIVE PERCENT: 11 %
NEUTROPHILS ABSOLUTE COUNT: 3.7 10*3/uL (ref 1.4–7.0)
NEUTROPHILS RELATIVE PERCENT: 57 %
PLATELET COUNT: 194 10*3/uL (ref 150–450)
RED BLOOD CELL COUNT: 4.35 x10E6/uL (ref 4.14–5.80)
RED CELL DISTRIBUTION WIDTH: 13.4 % (ref 11.6–15.4)
WHITE BLOOD CELL COUNT: 6.5 10*3/uL (ref 3.4–10.8)

## 2022-08-20 LAB — MAGNESIUM: MAGNESIUM: 1.8 mg/dL (ref 1.6–2.3)

## 2022-08-20 LAB — PHOSPHORUS: PHOSPHORUS, SERUM: 4.5 mg/dL — ABNORMAL HIGH (ref 2.8–4.1)

## 2022-08-20 LAB — GAMMA GT: GAMMA GLUTAMYL TRANSFERASE: 171 IU/L — ABNORMAL HIGH (ref 0–65)

## 2022-08-20 NOTE — Unmapped (Signed)
[facility] Refill Coordination Note    Specialty Medication(s) to be Shipped:   Transplant: Zortress 0.5mg     Other medication(s) to be shipped:  loperamide     Matthew Key, DOB: 07-03-1961  Phone: [phone] (home) [phone] (work)      All above HIPAA information was verified with patient.     Was a Nurse, learning disability used for this call? No    Completed refill call assessment today to schedule patient's medication shipment from the [facility] [phone]).  All relevant notes have been reviewed.     Specialty medication(s) and dose(s) confirmed: Regimen is correct and unchanged.   Changes to medications: Matthew Key reports no changes at this time.  Changes to insurance: No  New side effects reported not previously addressed with a pharmacist or physician: None reported  Questions for the pharmacist: No    Confirmed patient received a Conservation officer, historic buildings and a Surveyor, mining with first shipment. The patient will receive a drug information handout for each medication shipped and additional FDA Medication Guides as required.       DISEASE/MEDICATION-SPECIFIC INFORMATION        N/A    SPECIALTY MEDICATION ADHERENCE     Medication Adherence    Patient reported X missed doses in the last month: 0  Specialty Medication: everolimus (immunosuppressive) 0.5 mg tablet (ZORTRESS)  Patient is on additional specialty medications: No                                Were doses missed due to medication being on hold? No    everolimus 0.5 mg: 14 days of medicine on hand     REFERRAL TO PHARMACIST     Referral to the pharmacist: Not needed      [facility]     Shipping address confirmed in Epic.     Delivery Scheduled: Yes, Expected medication delivery date: 08/28/22.     Medication will be delivered via UPS to the prescription address in Epic WAM.    Matthew Key   [facility] Specialty Technician

## 2022-08-21 DIAGNOSIS — Z944 Liver transplant status: Principal | ICD-10-CM

## 2022-08-21 DIAGNOSIS — E612 Magnesium deficiency: Principal | ICD-10-CM

## 2022-08-21 DIAGNOSIS — Z5181 Encounter for therapeutic drug level monitoring: Principal | ICD-10-CM

## 2022-08-21 LAB — TACROLIMUS LEVEL: TACROLIMUS BLOOD: 4.9 ng/mL (ref 2.0–20.0)

## 2022-08-21 MED FILL — MELATONIN 3 MG TABLET: ORAL | 60 days supply | Qty: 60 | Fill #2

## 2022-08-21 MED FILL — URSODIOL 300 MG CAPSULE: ORAL | 30 days supply | Qty: 90 | Fill #0

## 2022-08-22 LAB — EVEROLIMUS: EVEROLIMUS LEVEL: 2.7 ng/mL — ABNORMAL LOW (ref 3.0–8.0)

## 2022-08-22 NOTE — Unmapped (Signed)
Called pt and scheduled virtual pharmacy appt on 1/30 and virtual psych appt on 1/24. Pt said he had just had psych appt in December and didn't think he needed another one. Asked to be transferred to primary coord, which this tpa did.    Later received inbasket msge from tx psychologist who said the same thing and asked that appt be cancelled. Called pt and told him that psych appt had been cancelled and he only has 1/30 pharm appt now. He was thankful and verbalized understanding of all discussed.

## 2022-08-26 DIAGNOSIS — Z5181 Encounter for therapeutic drug level monitoring: Principal | ICD-10-CM

## 2022-08-26 DIAGNOSIS — E612 Magnesium deficiency: Principal | ICD-10-CM

## 2022-08-26 DIAGNOSIS — Z944 Liver transplant status: Principal | ICD-10-CM

## 2022-08-27 LAB — COMPREHENSIVE METABOLIC PANEL
A/G RATIO: 1.6 (ref 1.2–2.2)
ALBUMIN: 4 g/dL (ref 3.9–4.9)
ALKALINE PHOSPHATASE: 285 IU/L — ABNORMAL HIGH (ref 44–121)
ALT (SGPT): 12 IU/L (ref 0–44)
AST (SGOT): 14 IU/L (ref 0–40)
BILIRUBIN TOTAL (MG/DL) IN SER/PLAS: 0.2 mg/dL (ref 0.0–1.2)
BLOOD UREA NITROGEN: 21 mg/dL (ref 8–27)
BUN / CREAT RATIO: 15 (ref 10–24)
CALCIUM: 9.6 mg/dL (ref 8.6–10.2)
CHLORIDE: 104 mmol/L (ref 96–106)
CO2: 20 mmol/L (ref 20–29)
CREATININE: 1.42 mg/dL — ABNORMAL HIGH (ref 0.76–1.27)
GLOBULIN, TOTAL: 2.5 g/dL (ref 1.5–4.5)
GLUCOSE: 148 mg/dL — ABNORMAL HIGH (ref 70–99)
POTASSIUM: 5.2 mmol/L (ref 3.5–5.2)
SODIUM: 141 mmol/L (ref 134–144)
TOTAL PROTEIN: 6.5 g/dL (ref 6.0–8.5)

## 2022-08-27 LAB — BILIRUBIN, DIRECT: BILIRUBIN DIRECT: <0.1 mg/dL (ref 0.00–0.40)

## 2022-08-27 LAB — CBC W/ DIFFERENTIAL
BANDED NEUTROPHILS ABSOLUTE COUNT: 0.2 10*3/uL — ABNORMAL HIGH (ref 0.0–0.1)
BASOPHILS ABSOLUTE COUNT: 0 10*3/uL (ref 0.0–0.2)
BASOPHILS RELATIVE PERCENT: 0 %
EOSINOPHILS ABSOLUTE COUNT: 0.3 10*3/uL (ref 0.0–0.4)
EOSINOPHILS RELATIVE PERCENT: 4 %
HEMATOCRIT: 37.8 % (ref 37.5–51.0)
HEMOGLOBIN: 12.3 g/dL — ABNORMAL LOW (ref 13.0–17.7)
IMMATURE GRANULOCYTES: 2 %
LYMPHOCYTES ABSOLUTE COUNT: 1.5 10*3/uL (ref 0.7–3.1)
LYMPHOCYTES RELATIVE PERCENT: 19 %
MEAN CORPUSCULAR HEMOGLOBIN CONC: 32.5 g/dL (ref 31.5–35.7)
MEAN CORPUSCULAR HEMOGLOBIN: 25.6 pg — ABNORMAL LOW (ref 26.6–33.0)
MEAN CORPUSCULAR VOLUME: 79 fL (ref 79–97)
MONOCYTES ABSOLUTE COUNT: 0.6 10*3/uL (ref 0.1–0.9)
MONOCYTES RELATIVE PERCENT: 7 %
NEUTROPHILS ABSOLUTE COUNT: 5.3 10*3/uL (ref 1.4–7.0)
NEUTROPHILS RELATIVE PERCENT: 68 %
PLATELET COUNT: 226 10*3/uL (ref 150–450)
RED BLOOD CELL COUNT: 4.81 x10E6/uL (ref 4.14–5.80)
RED CELL DISTRIBUTION WIDTH: 13.2 % (ref 11.6–15.4)
WHITE BLOOD CELL COUNT: 7.8 10*3/uL (ref 3.4–10.8)

## 2022-08-27 LAB — PHOSPHORUS: PHOSPHORUS, SERUM: 3.8 mg/dL (ref 2.8–4.1)

## 2022-08-27 LAB — GAMMA GT: GAMMA GLUTAMYL TRANSFERASE: 93 IU/L — ABNORMAL HIGH (ref 0–65)

## 2022-08-27 LAB — MAGNESIUM: MAGNESIUM: 1.6 mg/dL (ref 1.6–2.3)

## 2022-08-27 NOTE — Unmapped (Signed)
Therapy Update Follow Up: No issues - Copay = $300

## 2022-08-28 DIAGNOSIS — Z944 Liver transplant status: Principal | ICD-10-CM

## 2022-08-28 DIAGNOSIS — Z5181 Encounter for therapeutic drug level monitoring: Principal | ICD-10-CM

## 2022-08-28 DIAGNOSIS — E612 Magnesium deficiency: Principal | ICD-10-CM

## 2022-08-28 LAB — EVEROLIMUS: EVEROLIMUS LEVEL: 2.6 ng/mL — ABNORMAL LOW (ref 3.0–8.0)

## 2022-08-28 LAB — TACROLIMUS LEVEL: TACROLIMUS BLOOD: 5.1 ng/mL (ref 2.0–20.0)

## 2022-08-28 NOTE — Unmapped (Signed)
Patient's 1/15 everolimus level subpar again, while tac just above 3-5 goal. Left msg for patient to return call to T[facility] to verify doses of IS.

## 2022-08-28 NOTE — Unmapped (Signed)
Matthew Key 's entire shipment will be delayed as a result of a high copay.     I have reached out to the patient  at [phone] and communicated the delay. We will wait for a call back from the patient to reschedule the delivery.  We have not confirmed the new delivery date.

## 2022-08-29 DIAGNOSIS — Z944 Liver transplant status: Principal | ICD-10-CM

## 2022-08-29 DIAGNOSIS — M791 Myalgia, unspecified site: Principal | ICD-10-CM

## 2022-08-29 MED ORDER — METHOCARBAMOL 500 MG TABLET
ORAL_TABLET | Freq: Three times a day (TID) | ORAL | 0 refills | 30 days | PRN
Start: 2022-08-29 — End: ?

## 2022-08-29 MED FILL — LOPERAMIDE 2 MG CAPSULE: 46 days supply | Qty: 60 | Fill #0

## 2022-08-29 NOTE — Unmapped (Signed)
Pt request for RX Refill

## 2022-08-30 MED ORDER — METHOCARBAMOL 500 MG TABLET
ORAL_TABLET | Freq: Three times a day (TID) | ORAL | 0 refills | 30 days | Status: CP | PRN
Start: 2022-08-30 — End: ?
  Filled 2022-09-02: qty 90, 30d supply, fill #0

## 2022-08-30 NOTE — Unmapped (Signed)
Archived PA request for Xifaxan in [facility] (under Dr. Piedad Climes)    Patient was prescribed Xifaxan x 14 days and has completed the therapy    Matthew Key

## 2022-09-02 DIAGNOSIS — Z5181 Encounter for therapeutic drug level monitoring: Principal | ICD-10-CM

## 2022-09-02 DIAGNOSIS — Z944 Liver transplant status: Principal | ICD-10-CM

## 2022-09-02 DIAGNOSIS — E612 Magnesium deficiency: Principal | ICD-10-CM

## 2022-09-04 DIAGNOSIS — Z5181 Encounter for therapeutic drug level monitoring: Principal | ICD-10-CM

## 2022-09-04 DIAGNOSIS — Z944 Liver transplant status: Principal | ICD-10-CM

## 2022-09-04 DIAGNOSIS — E612 Magnesium deficiency: Principal | ICD-10-CM

## 2022-09-04 LAB — CBC W/ DIFFERENTIAL
BANDED NEUTROPHILS ABSOLUTE COUNT: 0.1 10*3/uL (ref 0.0–0.1)
BASOPHILS ABSOLUTE COUNT: 0 10*3/uL (ref 0.0–0.2)
BASOPHILS RELATIVE PERCENT: 0 %
EOSINOPHILS ABSOLUTE COUNT: 0.2 10*3/uL (ref 0.0–0.4)
EOSINOPHILS RELATIVE PERCENT: 3 %
HEMATOCRIT: 39.2 % (ref 37.5–51.0)
HEMOGLOBIN: 12.8 g/dL — ABNORMAL LOW (ref 13.0–17.7)
IMMATURE GRANULOCYTES: 1 %
LYMPHOCYTES ABSOLUTE COUNT: 1.6 10*3/uL (ref 0.7–3.1)
LYMPHOCYTES RELATIVE PERCENT: 22 %
MEAN CORPUSCULAR HEMOGLOBIN CONC: 32.7 g/dL (ref 31.5–35.7)
MEAN CORPUSCULAR HEMOGLOBIN: 25.5 pg — ABNORMAL LOW (ref 26.6–33.0)
MEAN CORPUSCULAR VOLUME: 78 fL — ABNORMAL LOW (ref 79–97)
MONOCYTES ABSOLUTE COUNT: 0.6 10*3/uL (ref 0.1–0.9)
MONOCYTES RELATIVE PERCENT: 8 %
NEUTROPHILS ABSOLUTE COUNT: 4.8 10*3/uL (ref 1.4–7.0)
NEUTROPHILS RELATIVE PERCENT: 66 %
PLATELET COUNT: 192 10*3/uL (ref 150–450)
RED BLOOD CELL COUNT: 5.01 x10E6/uL (ref 4.14–5.80)
RED CELL DISTRIBUTION WIDTH: 13.3 % (ref 11.6–15.4)
WHITE BLOOD CELL COUNT: 7.2 10*3/uL (ref 3.4–10.8)

## 2022-09-04 LAB — COMPREHENSIVE METABOLIC PANEL
A/G RATIO: 1.4 (ref 1.2–2.2)
ALBUMIN: 4.2 g/dL (ref 3.9–4.9)
ALKALINE PHOSPHATASE: 263 IU/L — ABNORMAL HIGH (ref 44–121)
ALT (SGPT): 13 IU/L (ref 0–44)
AST (SGOT): 17 IU/L (ref 0–40)
BILIRUBIN TOTAL (MG/DL) IN SER/PLAS: 0.3 mg/dL (ref 0.0–1.2)
BLOOD UREA NITROGEN: 17 mg/dL (ref 8–27)
BUN / CREAT RATIO: 12 (ref 10–24)
CALCIUM: 9.6 mg/dL (ref 8.6–10.2)
CHLORIDE: 104 mmol/L (ref 96–106)
CO2: 22 mmol/L (ref 20–29)
CREATININE: 1.47 mg/dL — ABNORMAL HIGH (ref 0.76–1.27)
GLOBULIN, TOTAL: 2.9 g/dL (ref 1.5–4.5)
GLUCOSE: 94 mg/dL (ref 70–99)
POTASSIUM: 5.1 mmol/L (ref 3.5–5.2)
SODIUM: 144 mmol/L (ref 134–144)
TOTAL PROTEIN: 7.1 g/dL (ref 6.0–8.5)

## 2022-09-04 LAB — GAMMA GT: GAMMA GLUTAMYL TRANSFERASE: 67 IU/L — ABNORMAL HIGH (ref 0–65)

## 2022-09-04 LAB — MAGNESIUM: MAGNESIUM: 1.7 mg/dL (ref 1.6–2.3)

## 2022-09-04 LAB — BILIRUBIN, DIRECT: BILIRUBIN DIRECT: <0.1 mg/dL (ref 0.00–0.40)

## 2022-09-04 LAB — PHOSPHORUS: PHOSPHORUS, SERUM: 4.4 mg/dL — ABNORMAL HIGH (ref 2.8–4.1)

## 2022-09-04 NOTE — Unmapped (Signed)
[facility] Refill Coordination Note    Specialty Medication(s) to be Shipped:   Transplant: tacrolimus 1mg     Other medication(s) to be shipped:  Ozempic, Hydroxyzine     Matthew Key, DOB: [birth year]  Phone: [phone] (home) [phone] (work)      All above HIPAA information was verified with patient.     Was a Nurse, learning disability used for this call? No    Completed refill call assessment today to schedule patient's medication shipment from the [facility] [phone]).  All relevant notes have been reviewed.     Specialty medication(s) and dose(s) confirmed: Regimen is correct and unchanged.   Changes to medications: Matthew Key reports no changes at this time.  Changes to insurance: No  New side effects reported not previously addressed with a pharmacist or physician: None reported  Questions for the pharmacist: No    Confirmed patient received a Conservation officer, historic buildings and a Surveyor, mining with first shipment. The patient will receive a drug information handout for each medication shipped and additional FDA Medication Guides as required.       DISEASE/MEDICATION-SPECIFIC INFORMATION        N/A    SPECIALTY MEDICATION ADHERENCE     Medication Adherence    Patient reported X missed doses in the last month: 0  Specialty Medication: tacrolimus 1 MG capsule (PROGRAF)  Patient is on additional specialty medications: No  Patient is on more than two specialty medications: No                                Were doses missed due to medication being on hold? No    Tacrolimus 1 mg: 5 days of medicine on hand        REFERRAL TO PHARMACIST     Referral to the pharmacist: Not needed      [facility]     Shipping address confirmed in Epic.     Delivery Scheduled: Yes, Expected medication delivery date: 09/06/22.     Medication will be delivered via UPS to the prescription address in Epic WAM.    Matthew Key   [facility] Specialty Technician

## 2022-09-05 LAB — TACROLIMUS LEVEL: TACROLIMUS BLOOD: 6.7 ng/mL (ref 2.0–20.0)

## 2022-09-05 LAB — EVEROLIMUS: EVEROLIMUS LEVEL: 4 ng/mL (ref 3.0–8.0)

## 2022-09-05 MED FILL — HYDROXYZINE PAMOATE 50 MG CAPSULE: ORAL | 30 days supply | Qty: 30 | Fill #3

## 2022-09-05 MED FILL — TACROLIMUS 1 MG CAPSULE, IMMEDIATE-RELEASE: ORAL | 30 days supply | Qty: 150 | Fill #1

## 2022-09-05 MED FILL — OZEMPIC 0.25 MG OR 0.5 MG (2 MG/3 ML) SUBCUTANEOUS PEN INJECTOR: SUBCUTANEOUS | 28 days supply | Qty: 3 | Fill #1

## 2022-09-09 DIAGNOSIS — Z944 Liver transplant status: Principal | ICD-10-CM

## 2022-09-09 DIAGNOSIS — Z5181 Encounter for therapeutic drug level monitoring: Principal | ICD-10-CM

## 2022-09-09 DIAGNOSIS — E612 Magnesium deficiency: Principal | ICD-10-CM

## 2022-09-10 ENCOUNTER — Telehealth: Admit: 2022-09-10 | Discharge: 2022-09-11 | Payer: MEDICARE

## 2022-09-10 LAB — MAGNESIUM: MAGNESIUM: 1.8 mg/dL (ref 1.6–2.3)

## 2022-09-10 LAB — COMPREHENSIVE METABOLIC PANEL
A/G RATIO: 1.7 (ref 1.2–2.2)
ALBUMIN: 4.2 g/dL (ref 3.9–4.9)
ALKALINE PHOSPHATASE: 244 IU/L — ABNORMAL HIGH (ref 44–121)
ALT (SGPT): 13 IU/L (ref 0–44)
AST (SGOT): 15 IU/L (ref 0–40)
BILIRUBIN TOTAL (MG/DL) IN SER/PLAS: 0.3 mg/dL (ref 0.0–1.2)
BLOOD UREA NITROGEN: 18 mg/dL (ref 8–27)
BUN / CREAT RATIO: 13 (ref 10–24)
CALCIUM: 9.7 mg/dL (ref 8.6–10.2)
CHLORIDE: 99 mmol/L (ref 96–106)
CO2: 23 mmol/L (ref 20–29)
CREATININE: 1.39 mg/dL — ABNORMAL HIGH (ref 0.76–1.27)
GLOBULIN, TOTAL: 2.5 g/dL (ref 1.5–4.5)
GLUCOSE: 121 mg/dL — ABNORMAL HIGH (ref 70–99)
POTASSIUM: 4.8 mmol/L (ref 3.5–5.2)
SODIUM: 138 mmol/L (ref 134–144)
TOTAL PROTEIN: 6.7 g/dL (ref 6.0–8.5)

## 2022-09-10 LAB — CBC W/ DIFFERENTIAL
BANDED NEUTROPHILS ABSOLUTE COUNT: 0.1 10*3/uL (ref 0.0–0.1)
BASOPHILS ABSOLUTE COUNT: 0 10*3/uL (ref 0.0–0.2)
BASOPHILS RELATIVE PERCENT: 0 %
EOSINOPHILS ABSOLUTE COUNT: 0.2 10*3/uL (ref 0.0–0.4)
EOSINOPHILS RELATIVE PERCENT: 3 %
HEMATOCRIT: 39.6 % (ref 37.5–51.0)
HEMOGLOBIN: 13 g/dL (ref 13.0–17.7)
IMMATURE GRANULOCYTES: 1 %
LYMPHOCYTES ABSOLUTE COUNT: 1.5 10*3/uL (ref 0.7–3.1)
LYMPHOCYTES RELATIVE PERCENT: 24 %
MEAN CORPUSCULAR HEMOGLOBIN CONC: 32.8 g/dL (ref 31.5–35.7)
MEAN CORPUSCULAR HEMOGLOBIN: 26.2 pg — ABNORMAL LOW (ref 26.6–33.0)
MEAN CORPUSCULAR VOLUME: 80 fL (ref 79–97)
MONOCYTES ABSOLUTE COUNT: 0.6 10*3/uL (ref 0.1–0.9)
MONOCYTES RELATIVE PERCENT: 10 %
NEUTROPHILS ABSOLUTE COUNT: 3.9 10*3/uL (ref 1.4–7.0)
NEUTROPHILS RELATIVE PERCENT: 62 %
PLATELET COUNT: 168 10*3/uL (ref 150–450)
RED BLOOD CELL COUNT: 4.96 x10E6/uL (ref 4.14–5.80)
RED CELL DISTRIBUTION WIDTH: 13.7 % (ref 11.6–15.4)
WHITE BLOOD CELL COUNT: 6.2 10*3/uL (ref 3.4–10.8)

## 2022-09-10 LAB — PHOSPHORUS: PHOSPHORUS, SERUM: 3.9 mg/dL (ref 2.8–4.1)

## 2022-09-10 LAB — BILIRUBIN, DIRECT: BILIRUBIN DIRECT: <0.1 mg/dL (ref 0.00–0.40)

## 2022-09-10 LAB — GAMMA GT: GAMMA GLUTAMYL TRANSFERASE: 46 IU/L (ref 0–65)

## 2022-09-10 NOTE — Unmapped (Signed)
[facility] PHARMACY DIABETES FOLLOW-UP  Matthew Key  [id number]    Medication changes today:   Increase Ozempic to 0.5 mg weekly (first dose today 1/30)    Follow up items:  DM- titrate Ozempic    Next phone visit with pharmacy in 1 month  ____________________________________________________________________    Matthew Key is a 62 y.o. 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     Other PMH significant for Hypertension and Stroke, T2DM, OSA on CPAP    Last seen by pharmacy 6 weeks ago    Interval History:   12/21 restarted Ozempic 0.25 mg weekly (previously did not tolerate dt diarrhea)  1/4 A1c increased to 7.5    Seen by pharmacy today for: blood glucose management and education  ___________________________________________________________________    Allergies   Allergen Reactions    Venom-Honey Bee Swelling       Current Outpatient Medications   Medication Instructions    acetaminophen (TYLENOL) 325-650 mg, Oral, Every 6 hours PRN    amlodipine (NORVASC) 10 mg, Oral, Daily (standard)    aspirin (ECOTRIN) 81 mg, Oral, Daily (standard)    blood sugar diagnostic (ACCU-CHEK GUIDE TEST STRIPS) Strp Use to check blood sugar as directed with insulin 3 times a day & for symptoms of high or low blood sugar    blood-glucose meter kit Use as instructed    blood-glucose meter Misc Use to test blood sugar as directed    DULoxetine (CYMBALTA) 60 mg, Oral, Daily (standard)    everolimus, immunosuppressive, (ZORTRESS) 0.5 mg tablet Take 4 tablets (2 mg total) by mouth every morning AND 3 tablets (1.5 mg total) every evening.    gabapentin (NEURONTIN) 300 mg, Oral, 3 times a day (standard)    gabapentin (NEURONTIN) 300 mg, Oral, 3 times a day (standard)    glucagon spray 3 mg/actuation Spry Use 1 spray into the left nostril every fifteen (15) minutes as needed (low blood sugar).    glucagon spray 3 mg/actuation Spry 1 spray, Nasal, Continuous PRN    hydrOXYzine (VISTARIL) 50 mg, Oral, Nightly PRN    insulin aspart (NOVOLOG FLEXPEN) 100 unit/mL (3 mL) injection pen Inject under the skin per sliding scale prior to meals. If premeal BG 151-200 take 2 additional units for correction, if 201-250 take 4 additional units, etc. Max dose 36 units per day.  Store in-use prefilled pens at room temperature <86??F and use within 28 days; do not refrigerate.    insulin degludec (TRESIBA FLEXTOUCH U-100) 15 Units, Subcutaneous, Daily (standard), Adjust as instructed.    lancets (ACCU-CHEK SOFTCLIX LANCETS) Misc Use to check blood sugar as directed with insulin 3 times a day & for symptoms of high or low blood sugar.    loperamide (IMODIUM) 2 mg capsule Take 1 capsule by mouth three times a day, end after 12/15, then take 1 capsule daily as needed for loose stools    melatonin 3 mg, Oral, Every evening    methocarbamol (ROBAXIN) 500 mg, Oral, 3 times a day PRN    OZEMPIC 0.5 mg, Subcutaneous, Every 7 days, HOLD until told to resume by your coordinator    pantoprazole (PROTONIX) 40 mg, Oral, Daily PRN    pen needle, diabetic (BD ULTRA-FINE NANO PEN NEEDLE) 32 gauge x 5/32 (4 mm) Ndle Use as directed  1-2 times per day    psyllium (METAMUCIL) 3.4 gram packet 1 packet, Oral, 2 times a day (standard)    tacrolimus (  PROGRAF) 1 MG capsule Take 3 capsules (3 mg total) by mouth daily AND 2 capsules (2 mg total) nightly.    ursodiol (ACTIGALL) 300 mg, Oral, 3 times a day (standard)        DM:   Lab Results   Component Value Date    A1C 7.5 (H) 08/16/2022   . Goal A1c < 7  A1c increasing  History of Dm? Yes: T2DM  Established with endocrinologist/PCP for BG managment? Yes: Dr. Melrose Nakayama with [facility]    Currently on: Tresiba 15 units daily, Novolog SSI 2: 50 > 150 mg/dl, Ozempic 1.61 mg weekly    BG log:  -FBG usually 90-140  -Afternoon BG ~160-200  -Denies hypoglycemia    GI: Reports occasional loose stools, but not diarrhea like the last time he tried Ozempic. Denies abdominal discomfort, cramps, nausea, vomiting.    Plan: Increase Ozempic to 0.5 mg weekly. Follow up phone visit with pharmacy in 4 weeks. Patient will scheduled appointment with endocrinologist. Continue to monitor.     At future visit may consider SGLT2 inhibitor    During this visit, the following was completed:   BG log data assessment  complex treatment plan >1 DS   I spent a total of 15 minutes on audio-video with the patient delivering clinical care and providing education/counseling.    All questions/concerns were addressed to the patient's satisfaction.  __________________________________________  Christ Kick, PharmD  PGY-1 Pharmacy Resident    Hazeline Junker, PharmD, CPP  Abdominal Transplant Clinical Pharmacist Practitioner

## 2022-09-10 NOTE — Unmapped (Signed)
I spoke with patient Matthew Key to confirm appointments on 01/31/23.    Alcario Drought Suriah Peragine

## 2022-09-11 DIAGNOSIS — Z944 Liver transplant status: Principal | ICD-10-CM

## 2022-09-11 DIAGNOSIS — Z5181 Encounter for therapeutic drug level monitoring: Principal | ICD-10-CM

## 2022-09-11 DIAGNOSIS — E612 Magnesium deficiency: Principal | ICD-10-CM

## 2022-09-11 LAB — TACROLIMUS LEVEL: TACROLIMUS BLOOD: 5.9 ng/mL (ref 2.0–20.0)

## 2022-09-11 LAB — EVEROLIMUS: EVEROLIMUS LEVEL: 4.3 ng/mL (ref 3.0–8.0)

## 2022-09-11 MED ORDER — OZEMPIC 0.25 MG OR 0.5 MG (2 MG/3 ML) SUBCUTANEOUS PEN INJECTOR
SUBCUTANEOUS | 11 refills | 56 days | Status: CP
Start: 2022-09-11 — End: ?
  Filled 2022-10-07: qty 3, 28d supply, fill #0

## 2022-09-11 NOTE — Unmapped (Addendum)
Patient's most recent two Tac levels above 3-5 goal, including 1/29 results. Discussed with PharmD Chargual[employer], who recommended reducing his dose to 2mg  bid. Attempted to reach patient to confirm his IS dose and left msg to return call to [facility].     Returned call today (2/5) to discuss medication change. Left VM and patient returned call. Confirmed he had been taking the correct dosages of Everolimus and Tacrolimus and discussed dose change of tacrolimus, only. He verbalized understanding and agreed to change instruction on his med sheet and pill box.

## 2022-09-16 DIAGNOSIS — E612 Magnesium deficiency: Principal | ICD-10-CM

## 2022-09-16 DIAGNOSIS — Z944 Liver transplant status: Principal | ICD-10-CM

## 2022-09-16 DIAGNOSIS — Z5181 Encounter for therapeutic drug level monitoring: Principal | ICD-10-CM

## 2022-09-16 MED ORDER — TACROLIMUS 1 MG CAPSULE, IMMEDIATE-RELEASE
ORAL_CAPSULE | Freq: Two times a day (BID) | ORAL | 11 refills | 30 days | Status: CP
Start: 2022-09-16 — End: ?
  Filled 2022-09-30: qty 120, 30d supply, fill #0

## 2022-09-18 DIAGNOSIS — Z944 Liver transplant status: Principal | ICD-10-CM

## 2022-09-18 DIAGNOSIS — E612 Magnesium deficiency: Principal | ICD-10-CM

## 2022-09-18 DIAGNOSIS — Z5181 Encounter for therapeutic drug level monitoring: Principal | ICD-10-CM

## 2022-09-18 LAB — CBC W/ DIFFERENTIAL
BANDED NEUTROPHILS ABSOLUTE COUNT: 0 10*3/uL (ref 0.0–0.1)
BASOPHILS ABSOLUTE COUNT: 0 10*3/uL (ref 0.0–0.2)
BASOPHILS RELATIVE PERCENT: 0 %
EOSINOPHILS ABSOLUTE COUNT: 0.2 10*3/uL (ref 0.0–0.4)
EOSINOPHILS RELATIVE PERCENT: 3 %
HEMATOCRIT: 38.5 % (ref 37.5–51.0)
HEMOGLOBIN: 12.7 g/dL — ABNORMAL LOW (ref 13.0–17.7)
IMMATURE GRANULOCYTES: 0 %
LYMPHOCYTES ABSOLUTE COUNT: 1.5 10*3/uL (ref 0.7–3.1)
LYMPHOCYTES RELATIVE PERCENT: 26 %
MEAN CORPUSCULAR HEMOGLOBIN CONC: 33 g/dL (ref 31.5–35.7)
MEAN CORPUSCULAR HEMOGLOBIN: 26.2 pg — ABNORMAL LOW (ref 26.6–33.0)
MEAN CORPUSCULAR VOLUME: 79 fL (ref 79–97)
MONOCYTES ABSOLUTE COUNT: 0.7 10*3/uL (ref 0.1–0.9)
MONOCYTES RELATIVE PERCENT: 11 %
NEUTROPHILS ABSOLUTE COUNT: 3.5 10*3/uL (ref 1.4–7.0)
NEUTROPHILS RELATIVE PERCENT: 60 %
PLATELET COUNT: 194 10*3/uL (ref 150–450)
RED BLOOD CELL COUNT: 4.85 x10E6/uL (ref 4.14–5.80)
RED CELL DISTRIBUTION WIDTH: 13.7 % (ref 11.6–15.4)
WHITE BLOOD CELL COUNT: 5.8 10*3/uL (ref 3.4–10.8)

## 2022-09-18 LAB — MAGNESIUM: MAGNESIUM: 1.7 mg/dL (ref 1.6–2.3)

## 2022-09-18 LAB — COMPREHENSIVE METABOLIC PANEL
A/G RATIO: 1.7 (ref 1.2–2.2)
ALBUMIN: 4.1 g/dL (ref 3.9–4.9)
ALKALINE PHOSPHATASE: 228 IU/L — ABNORMAL HIGH (ref 44–121)
ALT (SGPT): 14 IU/L (ref 0–44)
AST (SGOT): 15 IU/L (ref 0–40)
BILIRUBIN TOTAL (MG/DL) IN SER/PLAS: 0.3 mg/dL (ref 0.0–1.2)
BLOOD UREA NITROGEN: 19 mg/dL (ref 8–27)
BUN / CREAT RATIO: 13 (ref 10–24)
CALCIUM: 9.5 mg/dL (ref 8.6–10.2)
CHLORIDE: 102 mmol/L (ref 96–106)
CO2: 24 mmol/L (ref 20–29)
CREATININE: 1.48 mg/dL — ABNORMAL HIGH (ref 0.76–1.27)
GLOBULIN, TOTAL: 2.4 g/dL (ref 1.5–4.5)
GLUCOSE: 123 mg/dL — ABNORMAL HIGH (ref 70–99)
POTASSIUM: 5.1 mmol/L (ref 3.5–5.2)
SODIUM: 139 mmol/L (ref 134–144)
TOTAL PROTEIN: 6.5 g/dL (ref 6.0–8.5)

## 2022-09-18 LAB — GAMMA GT: GAMMA GLUTAMYL TRANSFERASE: 32 IU/L (ref 0–65)

## 2022-09-18 LAB — BILIRUBIN, DIRECT: BILIRUBIN DIRECT: <0.1 mg/dL (ref 0.00–0.40)

## 2022-09-18 LAB — PHOSPHORUS: PHOSPHORUS, SERUM: 3.9 mg/dL (ref 2.8–4.1)

## 2022-09-18 NOTE — Unmapped (Addendum)
[facility] Pharmacist has reviewed a new prescription for tacrolimus that indicates a dose decrease.  Patient was counseled on this dosage change by [facility]- see epic note from 09/11/22.  Next refill call date adjusted if necessary.          Clinical Assessment Needed For: Dose Change  Medication: Tacrolimus 1mg  capsule  Last Fill Date/Day Supply: 09/05/2022 / 30 days  Refill Too Soon until 09/28/2022  Was previous dose already scheduled to fill: No    Notes to Pharmacist: Will re-test on 02/19

## 2022-09-19 LAB — TACROLIMUS LEVEL: TACROLIMUS BLOOD: 3.5 ng/mL (ref 2.0–20.0)

## 2022-09-19 LAB — EVEROLIMUS: EVEROLIMUS LEVEL: 3 ng/mL (ref 3.0–8.0)

## 2022-09-23 DIAGNOSIS — Z5181 Encounter for therapeutic drug level monitoring: Principal | ICD-10-CM

## 2022-09-23 DIAGNOSIS — Z944 Liver transplant status: Principal | ICD-10-CM

## 2022-09-23 DIAGNOSIS — E612 Magnesium deficiency: Principal | ICD-10-CM

## 2022-09-24 NOTE — Unmapped (Signed)
[facility] Pharm[facility] Refill Coordination Note    Specialty Medication(s) to be Shipped:   Transplant: Everolimus 0.5mg     Other medication(s) to be shipped:  accu-chek guide test strips, aspirin 81mg , ulticare pen needles     Matthew Key, DOB: 1960/11/14  Phone: [phone] (home) [phone] (work)      All above HIPAA information was verified with patient.     Was a Nurse, learning disability used for this call? No    Completed refill call assessment today to schedule patient's medication shipment from the [facility] [phone]).  All relevant notes have been reviewed.     Specialty medication(s) and dose(s) confirmed: Regimen is correct and unchanged.   Changes to medications: Matthew Key reports no changes at this time.  Changes to insurance: No  New side effects reported not previously addressed with a pharmacist or physician: None reported  Questions for the pharmacist: No    Confirmed patient received a Conservation officer, historic buildings and a Surveyor, mining with first shipment. The patient will receive a drug information handout for each medication shipped and additional FDA Medication Guides as required.       DISEASE/MEDICATION-SPECIFIC INFORMATION        N/A    SPECIALTY MEDICATION ADHERENCE     Medication Adherence    Patient reported X missed doses in the last month: 0  Specialty Medication: everolimus (immunosuppressive) 0.5 mg tablet (ZORTRESS)  Patient is on additional specialty medications: No  Informant: patient              Were doses missed due to medication being on hold? No    Everolimus 0.5 mg: 7 days of medicine on hand       REFERRAL TO PHARMACIST     Referral to the pharmacist: Not needed      [facility]     Shipping address confirmed in Epic.     Patient was notified of new phone menu : No    Delivery Scheduled: Yes, Expected medication delivery date: 09/26/22.     Medication will be delivered via UPS to the prescription address in Epic WAM.    Matthew Key   [facility] Specialty Technician

## 2022-09-24 NOTE — Unmapped (Signed)
[facility] Pharm[facility] Refill Coordination Note    Specialty Medication(s) to be Shipped:   Transplant: tacrolimus 1mg     Other medication(s) to be shipped: No additional medications requested for fill at this time     Matthew Key, DOB: 10-Aug-1961  Phone: [phone] (home) [phone] (work)      All above HIPAA information was verified with patient.     Was a Nurse, learning disability used for this call? No    Completed refill call assessment today to schedule patient's medication shipment from the [facility] [phone]).  All relevant notes have been reviewed.     Specialty medication(s) and dose(s) confirmed: Regimen is correct and unchanged.   Changes to medications: Matthew Key reports no changes at this time.  Changes to insurance: No  New side effects reported not previously addressed with a pharmacist or physician: None reported  Questions for the pharmacist: No    Confirmed patient received a Conservation officer, historic buildings and a Surveyor, mining with first shipment. The patient will receive a drug information handout for each medication shipped and additional FDA Medication Guides as required.       DISEASE/MEDICATION-SPECIFIC INFORMATION        N/A    SPECIALTY MEDICATION ADHERENCE     Medication Adherence    Patient reported X missed doses in the last month: 0  Specialty Medication: tacrolimus 1 MG capsule (PROGRAF)  Patient is on additional specialty medications: No  Additional Specialty Medications: everolimus (immunosuppressive) 0.5 mg tablet (ZORTRESS)  Patient is on more than two specialty medications: No  Informant: patient              Were doses missed due to medication being on hold? No    Tacrolimus 1 mg: 10 days of medicine on hand       REFERRAL TO PHARMACIST     Referral to the pharmacist: Not needed      [facility]     Shipping address confirmed in Epic.     Patient was notified of new phone menu : No    Delivery Scheduled: Yes, Expected medication delivery date: 10/01/22. Medication will be delivered via UPS to the prescription address in Epic WAM.    Jasper Loser   [facility] Specialty Technician

## 2022-09-25 DIAGNOSIS — E612 Magnesium deficiency: Principal | ICD-10-CM

## 2022-09-25 DIAGNOSIS — Z5181 Encounter for therapeutic drug level monitoring: Principal | ICD-10-CM

## 2022-09-25 DIAGNOSIS — Z944 Liver transplant status: Principal | ICD-10-CM

## 2022-09-25 LAB — COMPREHENSIVE METABOLIC PANEL
A/G RATIO: 1.4 (ref 1.2–2.2)
ALBUMIN: 3.9 g/dL (ref 3.9–4.9)
ALKALINE PHOSPHATASE: 237 IU/L — ABNORMAL HIGH (ref 44–121)
ALT (SGPT): 13 IU/L (ref 0–44)
AST (SGOT): 18 IU/L (ref 0–40)
BILIRUBIN TOTAL (MG/DL) IN SER/PLAS: 0.2 mg/dL (ref 0.0–1.2)
BLOOD UREA NITROGEN: 20 mg/dL (ref 8–27)
BUN / CREAT RATIO: 14 (ref 10–24)
CALCIUM: 9.5 mg/dL (ref 8.6–10.2)
CHLORIDE: 104 mmol/L (ref 96–106)
CO2: 23 mmol/L (ref 20–29)
CREATININE: 1.45 mg/dL — ABNORMAL HIGH (ref 0.76–1.27)
GLOBULIN, TOTAL: 2.7 g/dL (ref 1.5–4.5)
GLUCOSE: 133 mg/dL — ABNORMAL HIGH (ref 70–99)
POTASSIUM: 5.4 mmol/L — ABNORMAL HIGH (ref 3.5–5.2)
SODIUM: 142 mmol/L (ref 134–144)
TOTAL PROTEIN: 6.6 g/dL (ref 6.0–8.5)

## 2022-09-25 LAB — BILIRUBIN, DIRECT: BILIRUBIN DIRECT: <0.1 mg/dL (ref 0.00–0.40)

## 2022-09-25 LAB — CBC W/ DIFFERENTIAL
BANDED NEUTROPHILS ABSOLUTE COUNT: 0 10*3/uL (ref 0.0–0.1)
BASOPHILS ABSOLUTE COUNT: 0 10*3/uL (ref 0.0–0.2)
BASOPHILS RELATIVE PERCENT: 0 %
EOSINOPHILS ABSOLUTE COUNT: 0.1 10*3/uL (ref 0.0–0.4)
EOSINOPHILS RELATIVE PERCENT: 2 %
HEMATOCRIT: 40.2 % (ref 37.5–51.0)
HEMOGLOBIN: 12.9 g/dL — ABNORMAL LOW (ref 13.0–17.7)
IMMATURE GRANULOCYTES: 0 %
LYMPHOCYTES ABSOLUTE COUNT: 1.6 10*3/uL (ref 0.7–3.1)
LYMPHOCYTES RELATIVE PERCENT: 24 %
MEAN CORPUSCULAR HEMOGLOBIN CONC: 32.1 g/dL (ref 31.5–35.7)
MEAN CORPUSCULAR HEMOGLOBIN: 26 pg — ABNORMAL LOW (ref 26.6–33.0)
MEAN CORPUSCULAR VOLUME: 81 fL (ref 79–97)
MONOCYTES ABSOLUTE COUNT: 0.6 10*3/uL (ref 0.1–0.9)
MONOCYTES RELATIVE PERCENT: 10 %
NEUTROPHILS ABSOLUTE COUNT: 4.3 10*3/uL (ref 1.4–7.0)
NEUTROPHILS RELATIVE PERCENT: 64 %
PLATELET COUNT: 218 10*3/uL (ref 150–450)
RED BLOOD CELL COUNT: 4.96 x10E6/uL (ref 4.14–5.80)
RED CELL DISTRIBUTION WIDTH: 14.4 % (ref 11.6–15.4)
WHITE BLOOD CELL COUNT: 6.7 10*3/uL (ref 3.4–10.8)

## 2022-09-25 LAB — GAMMA GT: GAMMA GLUTAMYL TRANSFERASE: 27 IU/L (ref 0–65)

## 2022-09-25 LAB — PHOSPHORUS: PHOSPHORUS, SERUM: 4 mg/dL (ref 2.8–4.1)

## 2022-09-25 LAB — MAGNESIUM: MAGNESIUM: 1.8 mg/dL (ref 1.6–2.3)

## 2022-09-25 MED FILL — ASPIRIN 81 MG TABLET,DELAYED RELEASE: ORAL | 90 days supply | Qty: 90 | Fill #2

## 2022-09-25 MED FILL — ACCU-CHEK GUIDE TEST STRIPS: 50 days supply | Qty: 200 | Fill #5

## 2022-09-25 MED FILL — BD ULTRA-FINE NANO PEN NEEDLE 32 GAUGE X 5/32" (4 MM): 50 days supply | Qty: 100 | Fill #3

## 2022-09-25 MED FILL — EVEROLIMUS (IMMUNOSUPPRESSIVE) 0.5 MG TABLET: ORAL | 26 days supply | Qty: 180 | Fill #1

## 2022-09-26 LAB — TACROLIMUS LEVEL: TACROLIMUS BLOOD: 4.1 ng/mL (ref 2.0–20.0)

## 2022-09-27 LAB — EVEROLIMUS: EVEROLIMUS LEVEL: 3.7 ng/mL (ref 3.0–8.0)

## 2022-09-30 DIAGNOSIS — Z5181 Encounter for therapeutic drug level monitoring: Principal | ICD-10-CM

## 2022-09-30 DIAGNOSIS — E612 Magnesium deficiency: Principal | ICD-10-CM

## 2022-09-30 DIAGNOSIS — Z944 Liver transplant status: Principal | ICD-10-CM

## 2022-09-30 NOTE — Unmapped (Signed)
Therapy Update Follow Up: No issues - Copay = $7.86

## 2022-10-01 LAB — CBC W/ DIFFERENTIAL
BANDED NEUTROPHILS ABSOLUTE COUNT: 0 10*3/uL (ref 0.0–0.1)
BASOPHILS ABSOLUTE COUNT: 0 10*3/uL (ref 0.0–0.2)
BASOPHILS RELATIVE PERCENT: 0 %
EOSINOPHILS ABSOLUTE COUNT: 0.1 10*3/uL (ref 0.0–0.4)
EOSINOPHILS RELATIVE PERCENT: 2 %
HEMATOCRIT: 42.5 % (ref 37.5–51.0)
HEMOGLOBIN: 13.6 g/dL (ref 13.0–17.7)
IMMATURE GRANULOCYTES: 0 %
LYMPHOCYTES ABSOLUTE COUNT: 1.5 10*3/uL (ref 0.7–3.1)
LYMPHOCYTES RELATIVE PERCENT: 21 %
MEAN CORPUSCULAR HEMOGLOBIN CONC: 32 g/dL (ref 31.5–35.7)
MEAN CORPUSCULAR HEMOGLOBIN: 25.6 pg — ABNORMAL LOW (ref 26.6–33.0)
MEAN CORPUSCULAR VOLUME: 80 fL (ref 79–97)
MONOCYTES ABSOLUTE COUNT: 0.7 10*3/uL (ref 0.1–0.9)
MONOCYTES RELATIVE PERCENT: 10 %
NEUTROPHILS ABSOLUTE COUNT: 4.7 10*3/uL (ref 1.4–7.0)
NEUTROPHILS RELATIVE PERCENT: 67 %
PLATELET COUNT: 201 10*3/uL (ref 150–450)
RED BLOOD CELL COUNT: 5.32 x10E6/uL (ref 4.14–5.80)
RED CELL DISTRIBUTION WIDTH: 14.1 % (ref 11.6–15.4)
WHITE BLOOD CELL COUNT: 7 10*3/uL (ref 3.4–10.8)

## 2022-10-01 LAB — COMPREHENSIVE METABOLIC PANEL
A/G RATIO: 1.6 (ref 1.2–2.2)
ALBUMIN: 4.1 g/dL (ref 3.9–4.9)
ALKALINE PHOSPHATASE: 266 IU/L — ABNORMAL HIGH (ref 44–121)
ALT (SGPT): 29 IU/L (ref 0–44)
AST (SGOT): 24 IU/L (ref 0–40)
BILIRUBIN TOTAL (MG/DL) IN SER/PLAS: 0.3 mg/dL (ref 0.0–1.2)
BLOOD UREA NITROGEN: 23 mg/dL (ref 8–27)
BUN / CREAT RATIO: 14 (ref 10–24)
CALCIUM: 9.6 mg/dL (ref 8.6–10.2)
CHLORIDE: 100 mmol/L (ref 96–106)
CO2: 24 mmol/L (ref 20–29)
CREATININE: 1.67 mg/dL — ABNORMAL HIGH (ref 0.76–1.27)
GLOBULIN, TOTAL: 2.6 g/dL (ref 1.5–4.5)
GLUCOSE: 138 mg/dL — ABNORMAL HIGH (ref 70–99)
POTASSIUM: 5.5 mmol/L — ABNORMAL HIGH (ref 3.5–5.2)
SODIUM: 138 mmol/L (ref 134–144)
TOTAL PROTEIN: 6.7 g/dL (ref 6.0–8.5)

## 2022-10-01 LAB — BILIRUBIN, DIRECT: BILIRUBIN DIRECT: 0.1 mg/dL (ref 0.00–0.40)

## 2022-10-01 LAB — GAMMA GT: GAMMA GLUTAMYL TRANSFERASE: 28 IU/L (ref 0–65)

## 2022-10-01 LAB — MAGNESIUM: MAGNESIUM: 2 mg/dL (ref 1.6–2.3)

## 2022-10-01 LAB — PHOSPHORUS: PHOSPHORUS, SERUM: 4.1 mg/dL (ref 2.8–4.1)

## 2022-10-02 DIAGNOSIS — E612 Magnesium deficiency: Principal | ICD-10-CM

## 2022-10-02 DIAGNOSIS — Z5181 Encounter for therapeutic drug level monitoring: Principal | ICD-10-CM

## 2022-10-02 DIAGNOSIS — Z944 Liver transplant status: Principal | ICD-10-CM

## 2022-10-03 LAB — TACROLIMUS LEVEL: TACROLIMUS BLOOD: 4.2 ng/mL (ref 2.0–20.0)

## 2022-10-03 LAB — EVEROLIMUS: EVEROLIMUS LEVEL: 3.4 ng/mL (ref 3.0–8.0)

## 2022-10-03 NOTE — Unmapped (Addendum)
Patient's 2/19 lab results with elevated K/Cr. IS levels were within goal. Left VM for patient to return call to [facility] to discuss. Spoke with patient, who denied any sx of palpitations/chest pain/sob or dizziness. He felt he was hydrating well, but admitting most of his fluids are caffeinated. He agreed to reduce his caffeine intake, increase fluid intake, and continue to monitor his dietary K.

## 2022-10-07 DIAGNOSIS — Z944 Liver transplant status: Principal | ICD-10-CM

## 2022-10-07 DIAGNOSIS — E612 Magnesium deficiency: Principal | ICD-10-CM

## 2022-10-07 DIAGNOSIS — Z5181 Encounter for therapeutic drug level monitoring: Principal | ICD-10-CM

## 2022-10-07 MED FILL — GABAPENTIN 300 MG CAPSULE: ORAL | 30 days supply | Qty: 90 | Fill #2

## 2022-10-07 MED FILL — NOVOLOG FLEXPEN U-100 INSULIN ASPART 100 UNIT/ML (3 ML) SUBCUTANEOUS: 41 days supply | Qty: 15 | Fill #4

## 2022-10-07 MED FILL — URSODIOL 300 MG CAPSULE: ORAL | 30 days supply | Qty: 90 | Fill #1

## 2022-10-08 LAB — COMPREHENSIVE METABOLIC PANEL
A/G RATIO: 1.6 (ref 1.2–2.2)
ALBUMIN: 3.8 g/dL — ABNORMAL LOW (ref 3.9–4.9)
ALKALINE PHOSPHATASE: 297 IU/L — ABNORMAL HIGH (ref 44–121)
ALT (SGPT): 76 IU/L — ABNORMAL HIGH (ref 0–44)
AST (SGOT): 87 IU/L — ABNORMAL HIGH (ref 0–40)
BILIRUBIN TOTAL (MG/DL) IN SER/PLAS: 0.4 mg/dL (ref 0.0–1.2)
BLOOD UREA NITROGEN: 18 mg/dL (ref 8–27)
BUN / CREAT RATIO: 11 (ref 10–24)
CALCIUM: 9.8 mg/dL (ref 8.6–10.2)
CHLORIDE: 104 mmol/L (ref 96–106)
CO2: 25 mmol/L (ref 20–29)
CREATININE: 1.57 mg/dL — ABNORMAL HIGH (ref 0.76–1.27)
GLOBULIN, TOTAL: 2.4 g/dL (ref 1.5–4.5)
GLUCOSE: 103 mg/dL — ABNORMAL HIGH (ref 70–99)
POTASSIUM: 4.8 mmol/L (ref 3.5–5.2)
SODIUM: 143 mmol/L (ref 134–144)
TOTAL PROTEIN: 6.2 g/dL (ref 6.0–8.5)

## 2022-10-08 LAB — CBC W/ DIFFERENTIAL
BANDED NEUTROPHILS ABSOLUTE COUNT: 0 10*3/uL (ref 0.0–0.1)
BASOPHILS ABSOLUTE COUNT: 0 10*3/uL (ref 0.0–0.2)
BASOPHILS RELATIVE PERCENT: 0 %
EOSINOPHILS ABSOLUTE COUNT: 0.1 10*3/uL (ref 0.0–0.4)
EOSINOPHILS RELATIVE PERCENT: 2 %
HEMATOCRIT: 39.4 % (ref 37.5–51.0)
HEMOGLOBIN: 12.8 g/dL — ABNORMAL LOW (ref 13.0–17.7)
IMMATURE GRANULOCYTES: 0 %
LYMPHOCYTES ABSOLUTE COUNT: 1.2 10*3/uL (ref 0.7–3.1)
LYMPHOCYTES RELATIVE PERCENT: 22 %
MEAN CORPUSCULAR HEMOGLOBIN CONC: 32.5 g/dL (ref 31.5–35.7)
MEAN CORPUSCULAR HEMOGLOBIN: 25.3 pg — ABNORMAL LOW (ref 26.6–33.0)
MEAN CORPUSCULAR VOLUME: 78 fL — ABNORMAL LOW (ref 79–97)
MONOCYTES ABSOLUTE COUNT: 0.5 10*3/uL (ref 0.1–0.9)
MONOCYTES RELATIVE PERCENT: 9 %
NEUTROPHILS ABSOLUTE COUNT: 3.6 10*3/uL (ref 1.4–7.0)
NEUTROPHILS RELATIVE PERCENT: 67 %
PLATELET COUNT: 178 10*3/uL (ref 150–450)
RED BLOOD CELL COUNT: 5.05 x10E6/uL (ref 4.14–5.80)
RED CELL DISTRIBUTION WIDTH: 14.4 % (ref 11.6–15.4)
WHITE BLOOD CELL COUNT: 5.4 10*3/uL (ref 3.4–10.8)

## 2022-10-08 LAB — BILIRUBIN, DIRECT: BILIRUBIN DIRECT: 0.15 mg/dL (ref 0.00–0.40)

## 2022-10-08 LAB — PHOSPHORUS: PHOSPHORUS, SERUM: 4.6 mg/dL — ABNORMAL HIGH (ref 2.8–4.1)

## 2022-10-08 LAB — MAGNESIUM: MAGNESIUM: 1.9 mg/dL (ref 1.6–2.3)

## 2022-10-08 LAB — GAMMA GT: GAMMA GLUTAMYL TRANSFERASE: 45 IU/L (ref 0–65)

## 2022-10-08 NOTE — Unmapped (Signed)
Called pt to check in, left vm requesting a return call as 2/26 LFTs elevated.    Received a return call from pt - he denied having any symptoms, say he is feeling pretty good. Reached out to Drs. Deutsch-Link & Moon for [employer].

## 2022-10-09 ENCOUNTER — Institutional Professional Consult (permissible substitution): Admit: 2022-10-09 | Discharge: 2022-10-10 | Payer: MEDICARE

## 2022-10-09 DIAGNOSIS — N401 Enlarged prostate with lower urinary tract symptoms: Secondary | ICD-10-CM | POA: Insufficient documentation

## 2022-10-09 DIAGNOSIS — N138 Other obstructive and reflux uropathy: Secondary | ICD-10-CM | POA: Insufficient documentation

## 2022-10-09 DIAGNOSIS — N529 Male erectile dysfunction, unspecified: Secondary | ICD-10-CM | POA: Insufficient documentation

## 2022-10-09 DIAGNOSIS — Z5181 Encounter for therapeutic drug level monitoring: Principal | ICD-10-CM

## 2022-10-09 DIAGNOSIS — Z944 Liver transplant status: Principal | ICD-10-CM

## 2022-10-09 DIAGNOSIS — E612 Magnesium deficiency: Principal | ICD-10-CM

## 2022-10-09 HISTORY — DX: Other obstructive and reflux uropathy: N13.8

## 2022-10-09 NOTE — Unmapped (Signed)
[facility] PHARMACY DIABETES FOLLOW-UP  Matthew Key  [id number]    Medication changes today:   None    Next phone visit with pharmacy in 1 month for standard 9 mo visit  ____________________________________________________________________    Matthew Key is a 62 y.o. 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     Other PMH significant for Hypertension and Stroke, T2DM, OSA on CPAP    Last seen by pharmacy 6 weeks ago    Interval History:   12/21 restarted Ozempic 0.25 mg weekly (previously did not tolerate dt diarrhea)  1/4 A1c increased to 7.5  Re-established with local Endo    Seen by pharmacy today for: blood glucose management and education  ___________________________________________________________________    Allergies   Allergen Reactions    Venom-Honey Bee Swelling       Current Outpatient Medications   Medication Instructions    acetaminophen (TYLENOL) 325-650 mg, Oral, Every 6 hours PRN    amlodipine (NORVASC) 10 mg, Oral, Daily (standard)    aspirin (ECOTRIN) 81 mg, Oral, Daily (standard)    blood sugar diagnostic (ACCU-CHEK GUIDE TEST STRIPS) Strp Use to check blood sugar as directed with insulin 3 times a day & for symptoms of high or low blood sugar    blood-glucose meter kit Use as instructed    blood-glucose meter Misc Use to test blood sugar as directed    DULoxetine (CYMBALTA) 60 mg, Oral, Daily (standard)    everolimus, immunosuppressive, (ZORTRESS) 0.5 mg tablet Take 4 tablets (2 mg total) by mouth every morning AND 3 tablets (1.5 mg total) every evening.    gabapentin (NEURONTIN) 300 mg, Oral, 3 times a day (standard)    gabapentin (NEURONTIN) 300 mg, Oral, 3 times a day (standard)    glucagon spray 3 mg/actuation Spry Use 1 spray into the left nostril every fifteen (15) minutes as needed (low blood sugar).    glucagon spray 3 mg/actuation Spry 1 spray, Nasal, Continuous PRN    hydrOXYzine (VISTARIL) 50 mg, Oral, Nightly PRN insulin aspart (NOVOLOG FLEXPEN) 100 unit/mL (3 mL) injection pen Inject under the skin per sliding scale prior to meals. If premeal BG 151-200 take 2 additional units for correction, if 201-250 take 4 additional units, etc. Max dose 36 units per day.  Store in-use prefilled pens at room temperature <86??F and use within 28 days; do not refrigerate.    lancets (ACCU-CHEK SOFTCLIX LANCETS) Misc Use to check blood sugar as directed with insulin 3 times a day & for symptoms of high or low blood sugar.    loperamide (IMODIUM) 2 mg capsule Take 1 capsule by mouth three times a day, end after 12/15, then take 1 capsule daily as needed for loose stools    melatonin 3 mg, Oral, Every evening    methocarbamol (ROBAXIN) 500 mg, Oral, 3 times a day PRN    OZEMPIC 0.5 mg, Subcutaneous, Every 7 days    pantoprazole (PROTONIX) 40 mg, Oral, Daily PRN    pen needle, diabetic (BD ULTRA-FINE NANO PEN NEEDLE) 32 gauge x 5/32 (4 mm) Ndle Use as directed  1-2 times per day    psyllium (METAMUCIL) 3.4 gram packet 1 packet, Oral, 2 times a day (standard)    tacrolimus (PROGRAF) 2 mg, Oral, 2 times a day    TRESIBA FLEXTOUCH U-100 15 Units, Subcutaneous, Daily (standard), Adjust as instructed.    ursodiol (ACTIGALL) 300 mg, Oral, 3 times a day (standard)  DM:   Lab Results   Component Value Date    A1C 7.5 (H) 08/16/2022   . Goal A1c < 7  A1c increasing  History of Dm? Yes: T2DM  Established with endocrinologist/PCP for BG managment? Yes: Dr. Melrose Nakayama with [facility] Endocr[facility]    Currently on: Tresiba 15 units daily, Novolog SSI 2: 50 > 150 mg/dl, Ozempic 0.5 mg weekly    BG log:  -7d average 167  -FBG usually 115  -Afternoon BG ~160-200  -Denies hypoglycemia    GI:  Denies abdominal discomfort, cramps, nausea, vomiting.    Plan: No changes. Defer further management to local Endo      During this visit, the following was completed:   BG log data assessment  complex treatment plan >1 DS   I spent a total of 15 minutes on audio-video with the patient delivering clinical care and providing education/counseling.    All questions/concerns were addressed to the patient's satisfaction.  __________________________________________  Jordan Likes, CPP, Pharmd

## 2022-10-09 NOTE — Unmapped (Signed)
Received message from Drs. Deutsch-Link & Moon:  From: Marene Lenz, MD  Sent: 10/09/2022   7:31 AM EST  To: Johnna Acosta, RN; Genia Harold, RN; *    Yes, why don't we start with KUB and repeat labs like we did last time and go from there.  ----- Message -----  From: Stephannie Li, MD  Sent: 10/08/2022   3:50 PM EST  To: Johnna Acosta, RN; Genia Harold, RN; *    Hey - This seemed to have happened back in January as well. We ended up getting a KUB to ensure no stent migration and repeat labs, and it just self resolved.     Greig Castilla - what do you think? Should we get imaging first? His stents are covered metal, so should be good until May, but I don't have a sense of whether they can get occluded sooner?    ----- Message -----  From: Johnna Acosta, RN  Sent: 10/08/2022   2:43 PM EST  To: Genia Harold, RN; Marene Lenz, MD; *    Hi Georgeanna Harrison,    I'm covering for Vernona Rieger today. This patient's LFTs bumped significantly - ast 87 (24), alt 76 (29), alp 297 (266), ggt 45 (28). He denies any abd pain, fevers, N/V. He had stents placed in December which are not due for exchange until June. Would it be worth doing an ERCP early?    Thanks,  [employer] pt to convey above. Secured KUB & lab appt tomorrow at [facility] main.

## 2022-10-10 ENCOUNTER — Ambulatory Visit: Admit: 2022-10-10 | Discharge: 2022-10-14 | Payer: MEDICARE

## 2022-10-10 ENCOUNTER — Ambulatory Visit: Admit: 2022-10-10 | Discharge: 2022-10-11 | Payer: MEDICARE

## 2022-10-10 ENCOUNTER — Ambulatory Visit: Admit: 2022-10-10 | Discharge: 2022-10-14 | Disposition: A | Discharge status: Home / Self Care | Payer: MEDICARE

## 2022-10-10 ENCOUNTER — Encounter: Admit: 2022-10-10 | Discharge: 2022-10-14 | Payer: MEDICARE | Attending: Certified Registered"

## 2022-10-10 LAB — COMPREHENSIVE METABOLIC PANEL
ALBUMIN: 3.6 g/dL (ref 3.4–5.0)
ALKALINE PHOSPHATASE: 397 U/L — ABNORMAL HIGH (ref 46–116)
ALT (SGPT): 278 U/L — ABNORMAL HIGH (ref 10–49)
ANION GAP: 8 mmol/L (ref 5–14)
AST (SGOT): 225 U/L — ABNORMAL HIGH (ref <=34)
BILIRUBIN TOTAL: 0.4 mg/dL (ref 0.3–1.2)
BLOOD UREA NITROGEN: 28 mg/dL — ABNORMAL HIGH (ref 9–23)
BUN / CREAT RATIO: 20
CALCIUM: 9.2 mg/dL (ref 8.7–10.4)
CHLORIDE: 105 mmol/L (ref 98–107)
CO2: 26 mmol/L (ref 20.0–31.0)
CREATININE: 1.39 mg/dL — ABNORMAL HIGH
EGFR CKD-EPI (2021) MALE: 57 mL/min/{1.73_m2} — ABNORMAL LOW (ref >=60)
GLUCOSE RANDOM: 205 mg/dL — ABNORMAL HIGH (ref 70–99)
POTASSIUM: 4.9 mmol/L — ABNORMAL HIGH (ref 3.4–4.8)
PROTEIN TOTAL: 7.5 g/dL (ref 5.7–8.2)
SODIUM: 139 mmol/L (ref 135–145)

## 2022-10-10 LAB — CBC W/ AUTO DIFF
BASOPHILS ABSOLUTE COUNT: 0.1 10*9/L (ref 0.0–0.1)
BASOPHILS RELATIVE PERCENT: 1.2 %
EOSINOPHILS ABSOLUTE COUNT: 0.1 10*9/L (ref 0.0–0.5)
EOSINOPHILS RELATIVE PERCENT: 1.5 %
HEMATOCRIT: 40.9 % (ref 39.0–48.0)
HEMOGLOBIN: 13.6 g/dL (ref 12.9–16.5)
LYMPHOCYTES ABSOLUTE COUNT: 1.8 10*9/L (ref 1.1–3.6)
LYMPHOCYTES RELATIVE PERCENT: 25.1 %
MEAN CORPUSCULAR HEMOGLOBIN CONC: 33.2 g/dL (ref 32.0–36.0)
MEAN CORPUSCULAR HEMOGLOBIN: 26 pg (ref 25.9–32.4)
MEAN CORPUSCULAR VOLUME: 78.4 fL (ref 77.6–95.7)
MEAN PLATELET VOLUME: 9.3 fL (ref 6.8–10.7)
MONOCYTES ABSOLUTE COUNT: 0.7 10*9/L (ref 0.3–0.8)
MONOCYTES RELATIVE PERCENT: 10.1 %
NEUTROPHILS ABSOLUTE COUNT: 4.5 10*9/L (ref 1.8–7.8)
NEUTROPHILS RELATIVE PERCENT: 62.1 %
PLATELET COUNT: 171 10*9/L (ref 150–450)
RED BLOOD CELL COUNT: 5.21 10*12/L (ref 4.26–5.60)
RED CELL DISTRIBUTION WIDTH: 15 % (ref 12.2–15.2)
WBC ADJUSTED: 7.2 10*9/L (ref 3.6–11.2)

## 2022-10-10 LAB — PROTIME-INR
INR: 0.91
PROTIME: 10.2 s (ref 9.9–12.6)

## 2022-10-10 LAB — TACROLIMUS LEVEL: TACROLIMUS BLOOD: 3 ng/mL (ref 2.0–20.0)

## 2022-10-10 LAB — MAGNESIUM: MAGNESIUM: 1.8 mg/dL (ref 1.6–2.6)

## 2022-10-10 LAB — BILIRUBIN, DIRECT: BILIRUBIN DIRECT: 0.2 mg/dL (ref 0.00–0.30)

## 2022-10-10 LAB — GAMMA GT: GAMMA GLUTAMYL TRANSFERASE: 109 U/L — ABNORMAL HIGH

## 2022-10-10 LAB — PHOSPHORUS: PHOSPHORUS: 4.1 mg/dL (ref 2.4–5.1)

## 2022-10-10 LAB — TACROLIMUS LEVEL, TROUGH: TACROLIMUS, TROUGH: 3.6 ng/mL — ABNORMAL LOW (ref 5.0–15.0)

## 2022-10-10 LAB — EVEROLIMUS: EVEROLIMUS LEVEL: 4.5 ng/mL (ref 3.0–15.0)

## 2022-10-10 MED ADMIN — amlodipine (NORVASC) tablet 10 mg: 10 mg | ORAL | @ 21:00:00

## 2022-10-10 MED ADMIN — gadobenate dimeglumine (MULTIHANCE) 529 mg/mL (0.1mmol/0.2mL) solution 8 mL: 8 mL | INTRAVENOUS | @ 23:00:00 | Stop: 2022-10-10

## 2022-10-10 MED ADMIN — ursodiol (ACTIGALL) capsule 300 mg: 300 mg | ORAL | @ 21:00:00

## 2022-10-10 MED ADMIN — heparin (porcine) 5,000 unit/mL injection 5,000 Units: 5000 [IU] | SUBCUTANEOUS | @ 21:00:00

## 2022-10-10 MED ADMIN — gabapentin (NEURONTIN) capsule 300 mg: 300 mg | ORAL | @ 21:00:00

## 2022-10-10 MED ADMIN — DULoxetine (CYMBALTA) DR capsule 60 mg: 60 mg | ORAL | @ 21:00:00

## 2022-10-10 NOTE — Unmapped (Signed)
Reviewed labs from 2/29 showing elevated LFTs.with Dr. Waynetta Sandy & [facility] and they want patient to go to ED for admission to get a MRCP with possible ERCP & biopsy. Called patient as he is still in [place] and conveyed recommendation. Reported he is heading to ED.

## 2022-10-10 NOTE — Unmapped (Signed)
Problem: Adult Inpatient Plan of Care  Goal: Plan of Care Review  Outcome: Ongoing - Unchanged  Goal: Patient-Specific Goal (Individualized)  Outcome: Ongoing - Unchanged  Goal: Absence of Hospital-Acquired Illness or Injury  Outcome: Ongoing - Unchanged  Intervention: Prevent Skin Injury  Recent Flowsheet Documentation  Taken 10/10/2022 1447 by Cecille Rubin, RN  Positioning for Skin: Supine/Back  Goal: Optimal Comfort and Wellbeing  Outcome: Ongoing - Unchanged  Goal: Readiness for Transition of Care  Outcome: Ongoing - Unchanged  Goal: Rounds/Family Conference  Outcome: Ongoing - Unchanged

## 2022-10-10 NOTE — Unmapped (Signed)
Per telephone note:       Reviewed labs from 2/29 showing elevated LFTs.with Dr. Waynetta Sandy & [facility] and they want patient to go to ED for admission to get a MRCP with possible ERCP & biopsy.

## 2022-10-10 NOTE — Unmapped (Cosign Needed)
Surgery History and Physical Note      Attending Physician:  [name], MD  Inpatient Service:  Surg Transplant [facility])  Date: 10/10/2022      Assessment :  Matthew Key is a 62 y.o. 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       Plan:  - Admission to SRF  - liver duplex & MRCP  - SQH  - SSI  - home meds     History of Present Illness:   Chief Complaint:  elevated liver enzymes     Matthew Key is a 62 y.o. 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     States he feels well. Has been eating and walking regularly. Admitted for similar episode in December, biopsy without evidence of rejection and enzymes normalized on their own.     Allergies  Allergies   Allergen Reactions    Venom-Honey Bee Swelling         Medications    No current facility-administered medications on file prior to encounter.     Current Outpatient Medications on File Prior to Encounter   Medication Sig Dispense Refill    acetaminophen (TYLENOL) 325 MG tablet Take 1-2 tablets (325-650 mg total) by mouth every six (6) hours as needed for pain or fever. 100 tablet 11    amlodipine (NORVASC) 10 MG tablet Take 1 tablet (10 mg total) by mouth daily. 30 tablet 11    aspirin (ECOTRIN) 81 MG tablet Take 1 tablet (81 mg total) by mouth daily. 90 tablet 3    blood sugar diagnostic (ACCU-CHEK GUIDE TEST STRIPS) Strp Use to check blood sugar as directed with insulin 3 times a day & for symptoms of high or low blood sugar 100 strip 11    blood-glucose meter Misc Use to test blood sugar as directed 1 each 0    blood-glucose meter kit Use as instructed 1 each 0    DULoxetine (CYMBALTA) 30 MG capsule Take 2 capsules (60 mg total) by mouth daily. 180 capsule 3    everolimus, immunosuppressive, (ZORTRESS) 0.5 mg tablet Take 4 tablets (2 mg total) by mouth every morning AND 3 tablets (1.5 mg total) every evening. 210 tablet 11    gabapentin (NEURONTIN) 300 MG capsule Take 1 capsule (300 mg total) by mouth Three (3) times a day. 90 capsule 2    glucagon spray 3 mg/actuation Spry Use 1 spray into the left nostril every fifteen (15) minutes as needed (low blood sugar). 2 each 1    hydrOXYzine (VISTARIL) 50 MG capsule Take 1 capsule (50 mg total) by mouth nightly as needed for anxiety. 30 capsule 3    insulin aspart (NOVOLOG FLEXPEN) 100 unit/mL (3 mL) injection pen Inject under the skin per sliding scale prior to meals. If premeal BG 151-200 take 2 additional units for correction, if 201-250 take 4 additional units, etc. Max dose 36 units per day.  Store in-use prefilled pens at room temperature <86??F and use within 28 days; do not refrigerate. 15 mL 6    insulin degludec (TRESIBA FLEXTOUCH U-100) 100 unit/mL (3 mL) InPn Inject 0.15 mL (15 Units total) under the skin daily. Adjust as instructed.  15 mL 3    lancets (ACCU-CHEK SOFTCLIX LANCETS) Misc Use to check blood sugar as directed with insulin 3 times a day & for symptoms of high or low blood sugar. 100 each 0    loperamide (IMODIUM) 2 mg capsule Take 1 capsule by mouth three times a day, end after 12/15, then take 1 capsule daily as needed for loose stools 60 capsule 5    melatonin 3 mg Tab Take 1 tablet (3 mg total) by mouth every evening. 30 tablet 11    methocarbamol (ROBAXIN) 500 MG tablet Take 1 tablet (500 mg total) by mouth Three (3) times a day as needed. 90 tablet 0    pantoprazole (PROTONIX) 40 MG tablet Take 1 tablet (40 mg total) by mouth daily as needed. 30 tablet 11    pen needle, diabetic (BD ULTRA-FINE NANO PEN NEEDLE) 32 gauge x 5/32 (4 mm) Ndle Use as directed  1-2 times per day 100 each 12    psyllium (METAMUCIL) 3.4 gram packet Take 1 packet by mouth two (2) times a day. 60 each 11    semaglutide (OZEMPIC) 0.25 mg or 0.5 mg (2 mg/3 mL) PnIj Inject 0.5 mg under the skin every seven (7) days. 6 mL 11    tacrolimus (PROGRAF) 1 MG capsule Take 2 capsules (2 mg total) by mouth two (2) times a day. 120 capsule 11    ursodiol (ACTIGALL) 300 mg capsule Take 1 capsule (300 mg total) by mouth Three (3) times a day. 90 capsule 11         Past Medical History  Past Medical History:   Diagnosis Date    AKI (acute kidney injury) (CMS-HCC) 12/14/2020    Anxiety 10/22/2013    Arthritis     Cervical radiculopathy 12/03/2016    Chronic pain disorder     Lower back    Cirrhosis (CMS-HCC)     Dental abscess 10/2020    Duodenal ulcer 12/01/2017    GERD (gastroesophageal reflux disease)     History of transfusion     Hyperlipidemia 10/22/2013    Hypertension     under control with meds and weight loss    Hypertension associated with diabetes (CMS-HCC) 09/25/2021    Hypotension 01/29/2022    Liver disease     Liver transplant status (CMS-HCC) 02/06/2022    Sleep apnea, obstructive     Have machine    Stroke (CMS-HCC)     mild stroke    Type 2 diabetes mellitus with diabetic neuropathy, with long-term current use of insulin (CMS-HCC) 06/09/2014         Past Surgical History  Past Surgical History:   Procedure Laterality Date    CHG X-RAY FOR BILE DUCT ENDOSCOPY  08/01/2022    Procedure: ENDOSCOPIC CATHETERIZATION OF THE BILIARY DUCTAL SYSTEM, RADIOLOGICAL SUPERVISION AND INTERPRETATION;  Surgeon: Vonda Antigua, MD;  [facility];  Service: Gastroenterology    KNEE SURGERY      PR CATH PLACE/CORON ANGIO, IMG SUPER/INTERP,R&L HRT CATH, L HRT VENTRIC N/A 12/19/2020    Procedure: CATH LEFT/RIGHT HEART CATHETERIZATION;  Surgeon: Rosana Hoes, MD;  Location: [facility] CATH;  Service: Cardiology    PR COLONOSCOPY FLX DX W/COLLJ SPEC WHEN PFRMD N/A 12/07/2021    Procedure: COLONOSCOPY, FLEXIBLE, PROXIMAL TO SPLENIC FLEXURE; DIAGNOSTIC, W/WO COLLECTION SPECIMEN BY BRUSH OR WASH; Surgeon: Annie Paras, MD;  Location: [facility];  Service: Gastroenterology    PR COLONOSCOPY W/BIOPSY SINGLE/MULTIPLE N/A 07/15/2022  Procedure: COLONOSCOPY, FLEXIBLE, PROXIMAL TO SPLENIC FLEXURE; WITH BIOPSY, SINGLE OR MULTIPLE;  Surgeon: Leland Her, MD;  Location: [facility];  Service: Gastroenterology    PR ERCP BALLOON DILATE BILIARY/PANC DUCT/AMPULLA EA N/A 08/01/2022    Procedure: ERCP;WITH TRANS-ENDOSCOPIC BALLOON DILATION OF BILIARY/PANCREATIC DUCT(S) OR OF AMPULLA, INCLUDING SPHINCTERECTOMY, WHEN PERFOREMD,EACH DUCT ([address]109);  Surgeon: Vonda Antigua, MD;  Location: [facility];  Service: Gastroenterology    PR ERCP STENT PLACEMENT BILIARY/PANCREATIC DUCT N/A 08/01/2022    Procedure: ENDOSCOPIC RETROGRADE CHOLANGIOPANCREATOGRAPHY (ERCP); WITH PLACEMENT OF ENDOSCOPIC STENT INTO BILIARY OR PANCREATIC DUCT;  Surgeon: Vonda Antigua, MD;  Location: [facility];  Service: Gastroenterology    PR ERCP,SPHINCTEROTOMY N/A 08/01/2022    Procedure: ERCP; W/SPHINCTEROTOMY/PAPILLOTOMY;  Surgeon: Vonda Antigua, MD;  Location: [facility];  Service: Gastroenterology    PR TRANSPLANT LIVER,ALLOTRANSPLANT N/A 02/06/2022    Procedure: LIVER ALLOTRANSPLANTATION; ORTHOTOPIC, PARTIAL OR WHOLE, FROM CADAVER OR LIVING DONOR, ANY AGE;  Surgeon: Florene Glen, MD;  Location: MAIN OR [place];  Service: Transplant    PR TRANSPLANT,PREP DONOR LIVER/ARTERIAL N/A 02/06/2022    Procedure: BACKBNCH RECONSTRUCT OF CAD/LIVE DONOR LIVER GFT PRIOR TRANSPLANT; ARTERIAL ANASTAMOSIS, EA;  Surgeon: Florene Glen, MD;  Location: MAIN OR [place];  Service: Transplant    PR UPPER GI ENDOSCOPY,BIOPSY N/A 10/12/2020    Procedure: UGI ENDOSCOPY; WITH BIOPSY, SINGLE OR MULTIPLE;  Surgeon: Marene Lenz, MD;  Location: [facility];  Service: Gastroenterology    PR UPPER GI ENDOSCOPY,DIAGNOSIS N/A 01/03/2022    Procedure: UGI ENDO, INCLUDE ESOPHAGUS, STOMACH, & DUODENUM &/OR JEJUNUM; DX W/WO COLLECTION SPECIMN, BY BRUSH OR WASH;  Surgeon: Marene Lenz, MD;  [facility]: [facility];  Service: Gastroenterology    PR UPPER GI ENDOSCOPY,LIGAT VARIX N/A 12/07/2021    Procedure: UGI ENDO; Everlene Balls LIG ESOPH &/OR GASTRIC VARICES;  Surgeon: Annie Paras, MD;  Location: [facility];  Service: Gastroenterology    ROOT CANAL      Front teeth         Family History  Family History   Problem Relation Age of Onset    Edema Mother     Alzheimer's disease Father     Aortic dissection Brother     Early death Brother     Aneurysm Brother          Social History:  Social History     Tobacco Use    Smoking status: Never    Smokeless tobacco: Never   Vaping Use    Vaping status: Never Used   Substance Use Topics    Alcohol use: Not Currently    Drug use: Not Currently         Review of Systems  A 12 system review of systems was negative except as noted in HPI      Vital Signs    Patient Vitals for the past 8 hrs:   BP Temp Temp src Pulse SpO2 Pulse Resp SpO2   10/10/22 1424 179/78 36.6 ??C (97.9 ??F) Oral 69 -- 18 98 %   10/10/22 1345 -- 36.6 ??C (97.8 ??F) Oral -- 68 -- 94 %   10/10/22 1340 -- -- -- -- -- -- 97 %   10/10/22 1338 165/78 -- -- -- -- -- --   10/10/22 1043 142/72 36.5 ??C (97.7 ??F) Oral 73 -- 18 99 %       Physical Exam  General Appearance: male  in no acute distress. Alert and oriented x 3.   Head:  Normocephalic, atraumatic.  Eyes: Conjunctiva and lids appear normal. Pupils equal, round, and reactive to light. Sclera anicteric.  Nose: Nares grossly normal, no drainage.  Neck: Supple, symmetrical. No appreciable thyromegaly or nodules.   Pulmonary: Normal respiratory effort on room air  Cardiovascular: Regular rate and rhythm.   Abdomen: Soft, non-tender, non-distended, without masses. Well healed mercedes incision.   Musculoskeletal: Extremities without clubbing, cyanosis or edema.  Neurologic:  No motor abnormalities noted. Sensation grossly intact.  Lymphatic: No cervical or supraclavicular lymphadenopathy.   Skin:  Skin color normal. No rashes or lesions. No Jaundice  Psychiatric: Judgement and insight seem appropriate. Oriented to person, place and time.    Labs and Studies  Labs:  Recent Results (from the past 24 hour(s))   PT-INR    Collection Time: 10/10/22  8:58 AM   Result Value Ref Range    PT 10.2 9.9 - 12.6 sec    INR 0.91    Comprehensive Metabolic Panel    Collection Time: 10/10/22  8:58 AM   Result Value Ref Range    Sodium 139 135 - 145 mmol/L    Potassium 4.9 (H) 3.4 - 4.8 mmol/L    Chloride 105 98 - 107 mmol/L    CO2 26.0 20.0 - 31.0 mmol/L    Anion Gap 8 5 - 14 mmol/L    BUN 28 (H) 9 - 23 mg/dL    Creatinine 9.81 (H) 0.73 - 1.18 mg/dL    BUN/Creatinine Ratio 20     eGFR CKD-EPI (2021) Male 57 (L) >=60 mL/min/1.52m2    Glucose 205 (H) 70 - 99 mg/dL    Calcium 9.2 8.7 - 19.1 mg/dL    Albumin 3.6 3.4 - 5.0 g/dL    Total Protein 7.5 5.7 - 8.2 g/dL    Total Bilirubin 0.4 0.3 - 1.2 mg/dL    AST 478 (H) <=29 U/L    ALT 278 (H) 10 - 49 U/L    Alkaline Phosphatase 397 (H) 46 - 116 U/L   Everolimus    Collection Time: 10/10/22  8:58 AM   Result Value Ref Range    Everolimus Level 4.5 3.0 - 15.0 ng/mL   Bilirubin, Direct    Collection Time: 10/10/22  8:58 AM   Result Value Ref Range    Bilirubin, Direct 0.20 0.00 - 0.30 mg/dL   Phosphorus Level    Collection Time: 10/10/22  8:58 AM   Result Value Ref Range    Phosphorus 4.1 2.4 - 5.1 mg/dL   Magnesium Level    Collection Time: 10/10/22  8:58 AM   Result Value Ref Range    Magnesium 1.8 1.6 - 2.6 mg/dL   Gamma GT    Collection Time: 10/10/22  8:58 AM   Result Value Ref Range    GGT 109 (H) 0 - 73 U/L   Tacrolimus Level, Trough    Collection Time: 10/10/22  8:58 AM   Result Value Ref Range    Tacrolimus, Trough 3.6 (L) 5.0 - 15.0 ng/mL   CBC w/ Differential    Collection Time: 10/10/22  8:58 AM   Result Value Ref Range    WBC 7.2 3.6 - 11.2 10*9/L    RBC 5.21 4.26 - 5.60 10*12/L    HGB 13.6 12.9 - 16.5 g/dL    HCT 56.2 13.0 - 86.5 %    MCV 78.4 77.6 - 95.7 fL    MCH 26.0  25.9 - 32.4 pg    MCHC 33.2 32.0 - 36.0 g/dL    RDW 81.1 91.4 - 78.2 %    MPV 9.3 6.8 - 10.7 fL    Platelet 171 150 - 450 10*9/L    Neutrophils % 62.1 %    Lymphocytes % 25.1 %    Monocytes % 10.1 %    Eosinophils % 1.5 %    Basophils % 1.2 %    Absolute Neutrophils 4.5 1.8 - 7.8 10*9/L    Absolute Lymphocytes 1.8 1.1 - 3.6 10*9/L    Absolute Monocytes 0.7 0.3 - 0.8 10*9/L    Absolute Eosinophils 0.1 0.0 - 0.5 10*9/L    Absolute Basophils 0.1 0.0 - 0.1 10*9/L    Microcytosis Slight (A) Not Present    Hypochromasia Slight (A) Not Present       Imaging:   Pending

## 2022-10-10 NOTE — Unmapped (Addendum)
Patient is a 62 y.o. 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 Celine Mans. Liver biopsy 12/5 wo evidence of rejection. Presents 2/29 for transaminitis.     Transplant LUS and MRI ***      Graft Function: DBD OLT on 6/28, c/b transaminitis 11/30, improving  -Liver biopsy 12/5 wo rejection    Immunosuppression:  -Induction: Basiliximab (Simulect) and Methylpred  -Tac at dc: 2mg  BID  -Everolimus at dc: 2mg  qAM &1mg  qPM   -Pred: none (completed taper)  Prophylaxis:  -PJP: Completed this admission, switched to pentamidine, received 07/12/22 (final dose of prophy). Initially discharged post transplant on bactrim, and transitioned to atovaquone at some point outpatient (likely related to hyperkalemia)   -CMV: Completed treatment 05/09/22. CMV (D-/R+ or D+/R+): Moderate Risk.  Serum CMV DNA 11/30 not detected.  -ASA 81 at dc: continue  Diarrhea  -functional GI decline consult, hepatology to manage   -rifaximin x14 days, suspect SIBO following likely viral gastroenteritis   -loperamide scheduled x1 week then daily prn   -holding ozempic until diarrhea resolves  Endo  -hold ozempic due to diarrhea  -Basal increased to 15u qAM from 13u while ozempic held  -continue aspart 2:50>150 correctional

## 2022-10-10 NOTE — Unmapped (Addendum)
Tacrolimus and Everolimus Therapeutic Monitoring Pharmacy Note    Matthew Key is a 62 y.o. male continuing tacrolimus and everolimus.     Indication:  Liver transplant      Date of Transplant:  02/06/2022       Prior Dosing Information: Home regimen tacrolimus 2 mg BID (using 1 mg tabs); Everolimus 2 mg AM and 1.5 mg PM (using 0.5 mg tabs)      Source(s) of information used to determine prior to admission dosing:  Clinic notes and Home med history    Goals:  Therapeutic Drug Levels  Tacrolimus trough goal: 3-5 ng/mL  Everolimus trough goal: 3-8 ng/mL      Additional Clinical Monitoring/Outcomes  Monitor renal function (SCr and urine output) and liver function (LFTs)  Monitor for signs/symptoms of adverse events (e.g., hyperglycemia, hyperkalemia, hypomagnesemia, hypertension, headache, tremor)    Results:   Tacrolimus level: Not applicable  Everolimus level:  Not applicable    Pharmacokinetic Considerations and Significant Drug Interactions:  Concurrent hepatotoxic medications: None identified  Concurrent CYP3A4 substrates/inhibitors: None identified  Concurrent nephrotoxic medications: None identified    Assessment/Plan:  Recommendedation(s)  Continue home regimen of tacrolimus PO 2 mg BID and everolimus 2 mg AM and 1.5 mg PM    Follow-up  Daily levels .   A pharmacist will continue to monitor and recommend levels as appropriate    Please page service pharmacist with questions/clarifications.    Laretta Alstrom, PharmD

## 2022-10-10 NOTE — Unmapped (Signed)
Patient states liver coordinator called him to come to the ED for MRI for elevated labs.

## 2022-10-10 NOTE — Unmapped (Signed)
I was the supervising physician in the delivery of the service. Cade Dashner S Erez Mccallum, MD

## 2022-10-11 LAB — BASIC METABOLIC PANEL
ANION GAP: 5 mmol/L (ref 5–14)
BLOOD UREA NITROGEN: 22 mg/dL (ref 9–23)
BUN / CREAT RATIO: 16
CALCIUM: 9 mg/dL (ref 8.7–10.4)
CHLORIDE: 105 mmol/L (ref 98–107)
CO2: 29 mmol/L (ref 20.0–31.0)
CREATININE: 1.37 mg/dL — ABNORMAL HIGH
EGFR CKD-EPI (2021) MALE: 58 mL/min/{1.73_m2} — ABNORMAL LOW (ref >=60)
GLUCOSE RANDOM: 166 mg/dL (ref 70–179)
POTASSIUM: 4.2 mmol/L (ref 3.4–4.8)
SODIUM: 139 mmol/L (ref 135–145)

## 2022-10-11 LAB — EVEROLIMUS
EVEROLIMUS LEVEL: 3.4 ng/mL (ref 3.0–8.0)
EVEROLIMUS LEVEL: 4.5 ng/mL (ref 3.0–15.0)

## 2022-10-11 LAB — HEPATIC FUNCTION PANEL
ALBUMIN: 3.1 g/dL — ABNORMAL LOW (ref 3.4–5.0)
ALKALINE PHOSPHATASE: 356 U/L — ABNORMAL HIGH (ref 46–116)
ALT (SGPT): 233 U/L — ABNORMAL HIGH (ref 10–49)
AST (SGOT): 120 U/L — ABNORMAL HIGH (ref <=34)
BILIRUBIN DIRECT: 0.2 mg/dL (ref 0.00–0.30)
BILIRUBIN TOTAL: 0.3 mg/dL (ref 0.3–1.2)
PROTEIN TOTAL: 6.1 g/dL (ref 5.7–8.2)

## 2022-10-11 LAB — MAGNESIUM: MAGNESIUM: 1.7 mg/dL (ref 1.6–2.6)

## 2022-10-11 LAB — CBC
HEMATOCRIT: 37.2 % — ABNORMAL LOW (ref 39.0–48.0)
HEMOGLOBIN: 12.4 g/dL — ABNORMAL LOW (ref 12.9–16.5)
MEAN CORPUSCULAR HEMOGLOBIN CONC: 33.4 g/dL (ref 32.0–36.0)
MEAN CORPUSCULAR HEMOGLOBIN: 25.9 pg (ref 25.9–32.4)
MEAN CORPUSCULAR VOLUME: 77.5 fL — ABNORMAL LOW (ref 77.6–95.7)
MEAN PLATELET VOLUME: 9.5 fL (ref 6.8–10.7)
PLATELET COUNT: 135 10*9/L — ABNORMAL LOW (ref 150–450)
RED BLOOD CELL COUNT: 4.81 10*12/L (ref 4.26–5.60)
RED CELL DISTRIBUTION WIDTH: 15 % (ref 12.2–15.2)
WBC ADJUSTED: 5.7 10*9/L (ref 3.6–11.2)

## 2022-10-11 LAB — PHOSPHORUS: PHOSPHORUS: 4.1 mg/dL (ref 2.4–5.1)

## 2022-10-11 LAB — TACROLIMUS LEVEL, TIMED: TACROLIMUS BLOOD: 2.8 ng/mL

## 2022-10-11 MED ADMIN — amlodipine (NORVASC) tablet 10 mg: 10 mg | ORAL | @ 14:00:00

## 2022-10-11 MED ADMIN — ursodiol (ACTIGALL) capsule 300 mg: 300 mg | ORAL | @ 03:00:00

## 2022-10-11 MED ADMIN — melatonin tablet 3 mg: 3 mg | ORAL | @ 03:00:00

## 2022-10-11 MED ADMIN — gabapentin (NEURONTIN) capsule 300 mg: 300 mg | ORAL | @ 03:00:00

## 2022-10-11 MED ADMIN — DULoxetine (CYMBALTA) DR capsule 60 mg: 60 mg | ORAL | @ 14:00:00

## 2022-10-11 MED ADMIN — fentaNYL (PF) (SUBLIMAZE) injection: INTRAVENOUS | @ 22:00:00 | Stop: 2022-10-11

## 2022-10-11 MED ADMIN — ursodiol (ACTIGALL) capsule 300 mg: 300 mg | ORAL | @ 19:00:00

## 2022-10-11 MED ADMIN — insulin NPH (HumuLIN,NovoLIN) injection 5 Units: 5 [IU] | SUBCUTANEOUS | @ 04:00:00

## 2022-10-11 MED ADMIN — heparin (porcine) 5,000 unit/mL injection 5,000 Units: 5000 [IU] | SUBCUTANEOUS | @ 03:00:00

## 2022-10-11 MED ADMIN — ondansetron (ZOFRAN) injection: INTRAVENOUS | @ 22:00:00 | Stop: 2022-10-11

## 2022-10-11 MED ADMIN — lactated Ringers infusion: 10 mL/h | INTRAVENOUS | @ 21:00:00

## 2022-10-11 MED ADMIN — piperacillin-tazobactam (ZOSYN) 3.375 g in sodium chloride 0.9 % (NS) 100 mL IVPB-MBP: 3.375 g | INTRAVENOUS | @ 14:00:00 | Stop: 2022-10-18

## 2022-10-11 MED ADMIN — dexAMETHasone (DECADRON) 4 mg/mL injection: INTRAVENOUS | @ 22:00:00 | Stop: 2022-10-11

## 2022-10-11 MED ADMIN — ursodiol (ACTIGALL) capsule 300 mg: 300 mg | ORAL | @ 14:00:00

## 2022-10-11 MED ADMIN — everolimus (immunosuppressive) (ZORTRESS) tablet 2 mg: 2 mg | ORAL | @ 14:00:00

## 2022-10-11 MED ADMIN — insulin lispro (HumaLOG) injection 0-6 Units: 0-6 [IU] | SUBCUTANEOUS | @ 04:00:00

## 2022-10-11 MED ADMIN — lactated Ringers infusion: INTRAVENOUS | @ 21:00:00 | Stop: 2022-10-11

## 2022-10-11 MED ADMIN — succinylcholine (ANECTINE) injection: INTRAVENOUS | @ 22:00:00 | Stop: 2022-10-11

## 2022-10-11 MED ADMIN — Propofol (DIPRIVAN) injection: INTRAVENOUS | @ 22:00:00 | Stop: 2022-10-11

## 2022-10-11 MED ADMIN — gabapentin (NEURONTIN) capsule 300 mg: 300 mg | ORAL | @ 14:00:00

## 2022-10-11 MED ADMIN — everolimus (immunosuppressive) (ZORTRESS) tablet 1.5 mg: 1.5 mg | ORAL | @ 03:00:00

## 2022-10-11 MED ADMIN — tacrolimus (PROGRAF) capsule 2 mg: 2 mg | ORAL | @ 14:00:00

## 2022-10-11 MED ADMIN — tacrolimus (PROGRAF) capsule 2 mg: 2 mg | ORAL | @ 03:00:00

## 2022-10-11 MED ADMIN — lidocaine (XYLOCAINE) 20 mg/mL (2 %) injection: INTRAVENOUS | @ 22:00:00 | Stop: 2022-10-11

## 2022-10-11 MED ADMIN — gabapentin (NEURONTIN) capsule 300 mg: 300 mg | ORAL | @ 19:00:00

## 2022-10-11 MED ADMIN — piperacillin-tazobactam (ZOSYN) 3.375 g in sodium chloride 0.9 % (NS) 100 mL IVPB-MBP: 3.375 g | INTRAVENOUS | @ 19:00:00 | Stop: 2022-10-18

## 2022-10-11 NOTE — Unmapped (Signed)
Problem: Adult Inpatient Plan of Care  Goal: Plan of Care Review  Outcome: Ongoing - Unchanged  Goal: Patient-Specific Goal (Individualized)  Outcome: Ongoing - Unchanged  Goal: Absence of Hospital-Acquired Illness or Injury  Outcome: Ongoing - Unchanged  Intervention: Prevent Skin Injury  Recent Flowsheet Documentation  Taken 10/11/2022 0800 by Cecille Rubin, RN  Positioning for Skin: Supine/Back  Goal: Optimal Comfort and Wellbeing  Outcome: Ongoing - Unchanged  Goal: Readiness for Transition of Care  Outcome: Ongoing - Unchanged  Goal: Rounds/Family Conference  Outcome: Ongoing - Unchanged

## 2022-10-11 NOTE — Unmapped (Signed)
Biliary & Advanced Endoscopy Consult Service   Initial Consultation         Assessment and Recommendations:   Matthew Key is a 62 y.o. 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 Patient is asymptomatic. MRCP with moderate dilation of the hepatic duct and intrahepatic biliary system upstream from the CBD stent. The patient is seen in consultation at the request of [name], MD (Surg Transplant [facility])) for management of a  biliary stricture . Will evaluate for new biliary stricture and assessment of prior stents.     Recommendations:   ERCP today    GI Pre-Procedure Checklist  Procedure: ERCP  Anticipated Date of Procedure: 3/1  Anticoagulants/Antiplatelets: Not Applicable  Anesthesia Concerns: None  Diet: Keep patient NPO    Issues Impacting Complexity of Management:  -None    Recommendations discussed with the patient's primary team. We will continue to follow along with you.    For questions, contact the on-call fellow for the Biliary & Advanced Endoscopy Consult Service.    Subjective:   On routine labs 2/29, found to have elevated AST/ALT 225/279, ALP 397. Previously normal a few weeks prior. Patient is completely asymptomatic but was sent in by liver team for MRCP and possible ERCP.    Prior work-up:  MRCP 10/10/22   -Moderate dilation of the hepatic duct and intrahepatic biliary system upstream from the common bile duct stent. Of note, the degree of biliary dilation is greater than that seen prior to stent placement. Recommend correlation with laboratory values. Additionally, recommend GI consultation for further assessment and consideration of potential stent interrogation.  - Subtle enhancement is seen adjacent to the proximal intrahepatic biliary ducts, with patchy mildly increased T2 signal in the liver parenchyma. Finding is nonspecific however recommend correlation with clinical exam to exclude underlying infectious etiology such as cholangitis.  - Stable peripheral hypoenhancing hepatic segment 6 lesion.  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     Objective:   Temp:  [36.5 ??C (97.7 ??F)-36.6 ??C (97.9 ??F)] 36.6 ??C (97.9 ??F)  Heart Rate:  [68-76] 75  SpO2 Pulse:  [68] 68  Resp:  [17-18] 17  BP: (135-179)/(68-78) 137/68  SpO2:  [90 %-99 %] 92 %    Gen: WDWN male in NAD, answers questions appropriately  Abdomen: Soft, NTND, no rebound/guarding, no hepatosplenomegaly  Extremities: No edema in the BLEs    Pertinent Labs/Studies:  -I have reviewed the patient's labs from 3/1 which show improving LFTs

## 2022-10-11 NOTE — Unmapped (Signed)
MRCP performed today which may give some insight as to why patient is having increases in his lab values. Upon return to the unit, patient caught up on sleep and received some insulin coverage for his increased blood sugar. Safety measures along with hourly rounding performed. Will continue to monitor.

## 2022-10-11 NOTE — Unmapped (Signed)
Transplant Surgery Progress Note    Service Date: 10/11/2022  Admit Date: 10/10/2022, Hospital Day: 2  Hospital Service: Surg Transplant [facility])  Attending: [name], MD    Assessment     Matthew Key is a 62 y.o. 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         Interval Events   NAEO, afebrile, hypertensive to 179/78 (@1430 ), improved after home amlodipine given, otherwise HDS on RA. No pain, required no PRNs. Strict I/Os not recorded, reports no issues voiding volitionally. Remains NPO, reports no N/V. MRCP yesterday with moderate dilation of the hepatic duct and intrahepatic biliary system upstream from the common bile duct stent.    Plan     Neuro: reports no pain   -gabapentin 300mg  TID  -duloxetine 60mg  qd  -atarax 50mg  qhs PRN     CV: HDS  - amlodipine 10mg  qd    Pulm: stable on room air  - NAI    F: [employer].  E: No active electrolyte abnormalities  N:NPO Sips with meds; Procedure/Test.    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   -  NAI    Heme/ID:   - zosyn 3.375g q6h 3/1-3/7  - daily CBC    Endo:   - insulin NPH 5u q12h  - SSI lispro ACHS    Prophylaxis:  - DVT: SCDs.   - Everolimus 2mg  qd and 1.5mg  qhs  - tacrlimus 2mg  BID  - daily tacrolimus, everolimus level    Disposition: Floor status.   -PT/OT not indicated  -Home health needs: none      Objective     Vitals:   Temp:  [36.6 ??C (97.8 ??F)-36.6 ??C (97.9 ??F)] 36.6 ??C (97.9 ??F)  Heart Rate:  [68-76] 70  SpO2 Pulse:  [68] 68  Resp:  [17-19] 19  BP: (127-179)/(68-80) 127/80  MAP (mmHg):  [89-108] 93  SpO2:  [90 %-99 %] 97 %  BMI (Calculated):  [29] 29    Physical Exam:  -General:  Appropriate, comfortable and in no apparent distress. -Neurological: Moves all 4 extremities spontaneously.   -Cardiovascular: HDS  -Pulmonary: Normal work of breathing on RA  -Abdomen: Soft, non-tender, non-distended. No rebound or guarding. Well healed mercedes incision  -Extremities: Warm, well perfused.     Recent Imaging:  MRI Abdomen W Wo Contrast MRCP    Result Date: 10/10/2022  EXAM: MRI abdomen with and without contrast, MRCP ACCESSION: [id number] UN CLINICAL INDICATION: 62 years old with Transient and a liver transplant patient      COMPARISON: CT abdomen/pelvis 08/02/2022, MRI abdomen 07/28/2022     TECHNIQUE: MRI of the abdomen was obtained with and without IV contrast.  Multisequence, multiplanar and dedicated T2 weighted images that highlight the biliary tree were obtained.         FINDINGS:     LOWER CHEST: Trace right pleural effusion.     ABDOMEN:     HEPATOBILIARY: Postoperative changes of liver transplant. Hepatic steatosis. Patchy mildly increased T2 signal scattered throughout the liver (for example 5:13, 16). There is a 0.8 cm hypoenhancing hepatic 6 lesion, which appears relatively stable compared to prior (18:52) no new lesions are  visualized.     Gallbladder is surgically absent. Common bile duct stent is seen. However, the common hepatic duct measures up to 1.8 cm. By comparison, it measured up to 1.4 cm prior to stent placement. Moderate intrahepatic biliary ductal dilation is noted. Subtle enhancement is seen adjacent to the proximal intrahepatic bile ducts. Pneumobilia.     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 No pancreatic ductal dilation. Mild pancreatic atrophy. SPLEEN: Unremarkable. ADRENAL GLANDS: Unremarkable. KIDNEYS/URETERS: Symmetric nephrograms. No hydronephrosis. BOWEL/PERITONEUM/RETROPERITONEUM: No bowel obstruction. No acute inflammatory process No ascites. VASCULATURE: Normal caliber abdominal aorta. Atherosclerotic calcifications. Portal and hepatic veins are patent. Unremarkable inferior vena cava. LYMPH NODES: No adenopathy.     BONES/SOFT TISSUES: No aggressive osseous lesions. Postsurgical changes of the anterior abdominal wall. Bilateral gynecomastia.         -Moderate dilation of the hepatic duct and intrahepatic biliary system upstream from the common bile duct stent. Of note, the degree of biliary dilation is greater than that seen prior to stent placement. Recommend correlation with laboratory values. Additionally, recommend GI consultation for further assessment and consideration of potential stent interrogation.     - Subtle enhancement is seen adjacent to the proximal intrahepatic biliary ducts, with patchy mildly increased T2 signal in the liver parenchyma. Finding is nonspecific however recommend correlation with clinical exam to exclude underlying infectious etiology such as cholangitis.     - Stable peripheral hypoenhancing hepatic segment 6 lesion.     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     ==================== MODIFIED REPORT: (10/10/2022 8:40 PM) This report has been modified from its preliminary version; you may check the prior versions of radiology report, results history link for prior report versions (if they were previously visible in Epic).     -----------------------------------------------    US Liver Transplant    Result Date: 10/10/2022  EXAM: US LIVER TRANSPLANT ACCESSION: [id number] UN     CLINICAL INDICATION: 62 years old with s/p OLT with elevated LFTs      COMPARISON: 08/02/2022 CT abdomen and pelvis and priors     TECHNIQUE: Ultrasound views of the complete abdomen were obtained using gray scale and color and spectral Doppler imaging.     FINDINGS:     HEPATOBILIARY: The liver shows diffuse increased echogenicity. No focal hepatic lesions. Mild central intrahepatic biliary ductal dilatation. The common bile duct is dilated. The gallbladder is surgically absent. Pneumobilia is present. There is likely a stent in the common bile duct.      Liver: 15.9 cm      Common bile duct: 1.5 cm     PANCREAS: Not well visualized..     SPLEEN: Normal in size and echotexture.       Spleen: 12.4 cm     KIDNEYS: Normal in size and echotexture. No solid masses or calculi. No hydronephrosis. Limited assessment due to overlying bowel gas.      Right kidney: 10.7 cm      Left kidney: 11.3 cm     VESSELS: - Portal vein: The main, left and right portal veins are patent with hepatopetal flow. Normal main portal vein velocity (0.20 m/s or greater)      Main portal vein diameter: 1.3 cm      Main portal vein pre anastomosis velocity: 0.34 m/s      Main portal vein anastomosis velocity: 0.37  m/s      Main portal vein post anastomosis velocity: 0.29 m/s      Anterior right portal vein velocity: 0.20 m/s      Posterior right portal vein velocity: 0.25 m/s      Left portal vein velocity: 0.17 m/s      Right portal vein flow: hepatopetal      Left portal vein flow: hepatopetal     - Splenic vein: Patent, with hepatopetal flow.      Splenic vein midline: hepatopetal      Splenic vein proximal: hepatopetal     - Hepatic veins/IVC: The IVC, left, middle and right hepatic veins are were patent. The right and middle hepatic veins are poorly visualized. The left hepatic vein could not be visualized.      Left hepatic vein phasicity/flow: not seen      Middle hepatic vein phasicity/flow: bi-tri      Right hepatic vein phasicity/flow: bi-tri      Inferior vena cava phasicity/flow: monophasic     - Hepatic artery: Patent with resistive indices within normal limits.      Proper hepatic artery resistive index: 0.75 and systolic acceleration time 21 msec      Right hepatic artery resistive index: 0.75 and systolic acceleration time 21 msec      Left hepatic artery resistive index: 0.73 and systolic acceleration time 21 msec     - Visualized proximal aorta: Atherosclerotic changes. OTHER: No ascites.         The left hepatic vein could not be visualized which could be secondary to technical reasons and overlying bowel gas. The right and middle hepatic veins are also poorly visualized although they are likely patent. The portal venous system and hepatic arteries are also patent. The portal venous system demonstrates normal flow direction. Normal resistive indices of the hepatic arteries. Common bile duct stent with pneumobilia. The common bile duct appears dilated with stent. Mild central intrahepatic biliary duct dilatation. Further assessment with MRI and MRCP could be considered due to the presence of elevated liver enzymes.     Please see below for data measurements:         Liver: 15.9 cm     Common bile duct: 1.5 cm     Right kidney: 10.7 cm Left kidney: 11.3 cm     Main portal vein diameter: 1.3 cm     Main portal vein pre anastomosis velocity: 0.34 m/s Main portal vein anastomosis velocity: 0.37 m/s Main portal vein post anastomosis velocity: 0.29 m/s Anterior right portal vein velocity: 0.20 m/s Posterior right portal vein velocity: 0.25 m/s Left portal vein velocity: 0.17 m/s     Right portal vein flow: hepatopetal Left portal vein flow: hepatopetal     Common hepatic artery resistive index: 0.75 and systolic acceleration time 21 msec Right hepatic artery resistive index: 0.75 and systolic acceleration time 21 msec Left hepatic artery resistive index: 0.73 and systolic acceleration time 21 msec     Left hepatic vein flow: not seen Middle hepatic vein flow: bi-tri Right hepatic vein flow: bi-tri Inferior vena cava flow: monophasic     Splenic vein midline: hepatopetal Splenic vein proximal: hepatopetal     Aorta: partially visualized Inferior vena cava: Partially visualized     Spleen: 12.4 cm     Abdominal free fluid visualized: no             XR Abdomen 1 View    Result Date: 10/10/2022  EXAM: XR ABDOMEN  1 VIEW ACCESSION: [id number] UN     CLINICAL INDICATION: 62 years old with Eval stent placement ; OTHER  - Z94.4 - Liver replaced by transplant (CMS - HCC)      COMPARISON: Abdominal radiograph 08/16/2022, CT abdomen pelvis 08/02/2022     TECHNIQUE: Supine view of the abdomen, 3 images(s)     FINDINGS: Lung bases are clear. Degenerative changes of the spine and bilateral hip joints. Surgical clips overlie the right upper quadrant and left upper quadrant. Biliary stent overlies the right upper quadrant. Pancreaticoduodenal stent crosses midline and overlies the L2 vertebral body.     Pneumobilia, similar to prior. Nonobstructive bowel gas pattern with moderate colonic stool burden. Vascular calcifications.         Stable position of the biliary stent projecting over the anticipated location of the common bile duct and the pancreatic pigtail stent overlying the anticipated location of the pancreas and duodenum.

## 2022-10-11 NOTE — Unmapped (Signed)
Endocrine Team Diabetes New Consult Note     Consult information:  Requesting Attending Physician : [name], MD  Service Requesting Consult : Surg Transplant [phone])  Primary Care Provider: [name], MD  Impression:  Matthew Key is a 62 y.o. male admitted for elevated liver enzymes. We have been consulted at the request of [name], MD to evaluate Merlin for hyperglycemia.     Medical Decision Making:  Diagnoses:  1.Type 2 Diabetes. Uncontrolled With hyperglycemia.  2. Nutrition: Complicating glycemic control. Increasing risk for both hypoglycemia and hyperglycemia.  3. Transplant. Complicating glycemic control and increasing risk for hyperglycemia.  4.  Elevated Transaminases. Complicating glycemic control and increasing risk for hyperglycemia and hypoglycemia  5. CKD. Complicating glycemic control and increasing risk for hypoglycemia.       Studies reviewed 10/11/22:  Labs: CBC, CMP, POCT-BG, and HbA1C  Interpretation: WBC WNL. Anemia. Normal sodium and potassium. Creatinine elevated and GFR reduced, consistent with known CKD. LFTS remain elevated but trending down overall. Intermittent hyperglycemia. A1c 7.5% indicating decent glycemic control outpatient.   Notes reviewed: Primary team and nursing notes      Overall impression based on above reviews and history:  Patient with T2DM admitted for elevated liver enzymes s/p OLT. He is known to our service and was well controlled on low dose basal/bolus insulin last admission. Will restart on prior inpatient regimen. TDD is close to his home regimen. We will continue to monitor closely.     Recommendations:  - NPH 5u q12h  - lispro 2u qAC  - lispro correction target 140, ISF 30 achs  - Hypoglycemia protocol.  - POCT-BG achs.  - Ensure patient is on glucose precautions if patient taking nutrition by mouth.     Discharge planning:  In process. Will complete closer to discharge.    Thank you for this consult. Discussed plan with primary team. We will continue to follow and make recommendations and place orders as appropriate.    Please page with questions or concerns: [place], [place]: [phone]  Endocrinology Diabetes Care Team on call from 6AM - 3PM on weekdays then endocrine fellow on call: [phone] from 3PM - 6AM on weekdays and on weekends and holidays.   If APP cannot be reached, please page the endocrine fellow on call.      Subjective:  Initial HPI:  Matthew Key is a 62 y.o. 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        He reports feeling fine today. Denies abdominal pain or N/V. Reports tolerating a regular diet. NPO today for possible ERCP.    Diabetes History:  Patient has a history of Type 2 diabetes diagnosed 2017.  Diabetes is managed by: PCP.  Current home diabetes regimen: tresiba 15 units at bedtime, novolog correction and ozempic 1mg  qweek (last dose 2/20).    Current home blood glucose monitoring:  3-4 times a day.  Hypoglycemia awareness: yes.  Complications related to diabetes: peripheral neuropathy      Current Nutrition:  Active Orders   Diet    NPO Sips with meds; Procedure/Test       ROS: As per HPI.     amlodipine  10 mg Oral Daily    DULoxetine  60 mg Oral Daily    everolimus (immunosuppressive)  2 mg  Oral Q AM    And    everolimus (immunosuppressive)  1.5 mg Oral Q PM    gabapentin  300 mg Oral TID    [Provider Hold] heparin (porcine) for subcutaneous use  5,000 Units Subcutaneous Q8H SCH    insulin lispro  0-6 Units Subcutaneous ACHS    insulin NPH  5 Units Subcutaneous Q12H SCH    melatonin  3 mg Oral QPM    piperacillin-tazobactam (ZOSYN) IV (intermittent)  3.375 g Intravenous Q6H    psyllium  1 packet Oral BID    tacrolimus  2 mg Oral BID    ursodiol  300 mg Oral TID       Current Outpatient Medications Medication Instructions    acetaminophen (TYLENOL) 325-650 mg, Oral, Every 6 hours PRN    amlodipine (NORVASC) 10 mg, Oral, Daily (standard)    aspirin (ECOTRIN) 81 mg, Oral, Daily (standard)    blood sugar diagnostic (ACCU-CHEK GUIDE TEST STRIPS) Strp Use to check blood sugar as directed with insulin 3 times a day & for symptoms of high or low blood sugar    blood-glucose meter kit Use as instructed    blood-glucose meter Misc Use to test blood sugar as directed    DULoxetine (CYMBALTA) 60 mg, Oral, Daily (standard)    everolimus, immunosuppressive, (ZORTRESS) 0.5 mg tablet Take 4 tablets (2 mg total) by mouth every morning AND 3 tablets (1.5 mg total) every evening.    gabapentin (NEURONTIN) 300 mg, Oral, 3 times a day (standard)    glucagon spray 3 mg/actuation Spry Use 1 spray into the left nostril every fifteen (15) minutes as needed (low blood sugar).    hydrOXYzine (VISTARIL) 50 mg, Oral, Nightly PRN    insulin aspart (NOVOLOG FLEXPEN) 100 unit/mL (3 mL) injection pen Inject under the skin per sliding scale prior to meals. If premeal BG 151-200 take 2 additional units for correction, if 201-250 take 4 additional units, etc. Max dose 36 units per day.  Store in-use prefilled pens at room temperature <86??F and use within 28 days; do not refrigerate.    lancets (ACCU-CHEK SOFTCLIX LANCETS) Misc Use to check blood sugar as directed with insulin 3 times a day & for symptoms of high or low blood sugar.    loperamide (IMODIUM) 2 mg capsule Take 1 capsule by mouth three times a day, end after 12/15, then take 1 capsule daily as needed for loose stools    melatonin 3 mg, Oral, Every evening    methocarbamol (ROBAXIN) 500 mg, Oral, 3 times a day PRN    OZEMPIC 0.5 mg, Subcutaneous, Every 7 days    pantoprazole (PROTONIX) 40 mg, Oral, Daily PRN    pen needle, diabetic (BD ULTRA-FINE NANO PEN NEEDLE) 32 gauge x 5/32 (4 mm) Ndle Use as directed  1-2 times per day    psyllium (METAMUCIL) 3.4 gram packet 1 packet, Oral, 2 times a day (standard)    tacrolimus (PROGRAF) 2 mg, Oral, 2 times a day    TRESIBA FLEXTOUCH U-100 15 Units, Subcutaneous, Daily (standard), Adjust as instructed.    ursodiol (ACTIGALL) 300 mg, Oral, 3 times a day (standard)           Past Medical History:   Diagnosis Date    AKI (acute kidney injury) (CMS-HCC) 12/14/2020    Anxiety 10/22/2013    Arthritis     Cervical radiculopathy 12/03/2016    Chronic pain disorder     Lower back    Cirrhosis (CMS-HCC)  Dental abscess 10/2020    Duodenal ulcer 12/01/2017    GERD (gastroesophageal reflux disease)     History of transfusion     Hyperlipidemia 10/22/2013    Hypertension     under control with meds and weight loss    Hypertension associated with diabetes (CMS-HCC) 09/25/2021    Hypotension 01/29/2022    Liver disease     Liver transplant status (CMS-HCC) 02/06/2022    Sleep apnea, obstructive     Have machine    Stroke (CMS-HCC)     mild stroke    Type 2 diabetes mellitus with diabetic neuropathy, with long-term current use of insulin (CMS-HCC) 06/09/2014       Past Surgical History:   Procedure Laterality Date    CHG X-RAY FOR BILE DUCT ENDOSCOPY  08/01/2022    Procedure: ENDOSCOPIC CATHETERIZATION OF THE BILIARY DUCTAL SYSTEM, RADIOLOGICAL SUPERVISION AND INTERPRETATION;  Surgeon: Vonda Antigua, MD;  [facility]: [facility];  Service: Gastroenterology    KNEE SURGERY      PR CATH PLACE/CORON ANGIO, IMG SUPER/INTERP,R&L HRT CATH, L HRT VENTRIC N/A 12/19/2020    Procedure: CATH LEFT/RIGHT HEART CATHETERIZATION;  Surgeon: Rosana Hoes, MD;  Location: [facility] CATH;  Service: Cardiology    PR COLONOSCOPY FLX DX W/COLLJ SPEC WHEN PFRMD N/A 12/07/2021    Procedure: COLONOSCOPY, FLEXIBLE, PROXIMAL TO SPLENIC FLEXURE; DIAGNOSTIC, W/WO COLLECTION SPECIMEN BY BRUSH OR WASH;  Surgeon: Annie Paras, MD;  Location: [facility];  Service: Gastroenterology    PR COLONOSCOPY W/BIOPSY SINGLE/MULTIPLE N/A 07/15/2022    Procedure: COLONOSCOPY, FLEXIBLE, PROXIMAL TO SPLENIC FLEXURE; WITH BIOPSY, SINGLE OR MULTIPLE;  Surgeon: Leland Her, MD;  Location: [facility];  Service: Gastroenterology    PR ERCP BALLOON DILATE BILIARY/PANC DUCT/AMPULLA EA N/A 08/01/2022    Procedure: ERCP;WITH TRANS-ENDOSCOPIC BALLOON DILATION OF BILIARY/PANCREATIC DUCT(S) OR OF AMPULLA, INCLUDING SPHINCTERECTOMY, WHEN PERFOREMD,EACH DUCT ([address]109);  Surgeon: Vonda Antigua, MD;  Location: [facility];  Service: Gastroenterology    PR ERCP STENT PLACEMENT BILIARY/PANCREATIC DUCT N/A 08/01/2022    Procedure: ENDOSCOPIC RETROGRADE CHOLANGIOPANCREATOGRAPHY (ERCP); WITH PLACEMENT OF ENDOSCOPIC STENT INTO BILIARY OR PANCREATIC DUCT;  Surgeon: Vonda Antigua, MD;  Location: [facility];  Service: Gastroenterology    PR ERCP,SPHINCTEROTOMY N/A 08/01/2022    Procedure: ERCP; W/SPHINCTEROTOMY/PAPILLOTOMY;  Surgeon: Vonda Antigua, MD;  Location: [facility];  Service: Gastroenterology    PR TRANSPLANT LIVER,ALLOTRANSPLANT N/A 02/06/2022    Procedure: LIVER ALLOTRANSPLANTATION; ORTHOTOPIC, PARTIAL OR WHOLE, FROM CADAVER OR LIVING DONOR, ANY AGE;  Surgeon: Florene Glen, MD;  Location: MAIN OR [place];  Service: Transplant    PR TRANSPLANT,PREP DONOR LIVER/ARTERIAL N/A 02/06/2022    Procedure: BACKBNCH RECONSTRUCT OF CAD/LIVE DONOR LIVER GFT PRIOR TRANSPLANT; ARTERIAL ANASTAMOSIS, EA;  Surgeon: Florene Glen, MD;  Location: MAIN OR [place];  Service: Transplant    PR UPPER GI ENDOSCOPY,BIOPSY N/A 10/12/2020    Procedure: UGI ENDOSCOPY; WITH BIOPSY, SINGLE OR MULTIPLE;  Surgeon: Marene Lenz, MD;  Location: [facility];  Service: Gastroenterology    PR UPPER GI ENDOSCOPY,DIAGNOSIS N/A 01/03/2022    Procedure: UGI ENDO, INCLUDE ESOPHAGUS, STOMACH, & DUODENUM &/OR JEJUNUM; DX W/WO COLLECTION SPECIMN, BY BRUSH OR WASH;  Surgeon: Marene Lenz, MD;  Location: [facility];  Service: Gastroenterology    PR UPPER GI ENDOSCOPY,LIGAT VARIX N/A 12/07/2021    Procedure: UGI ENDO; Everlene Balls LIG ESOPH &/OR GASTRIC VARICES;  Surgeon: Ferrel Logan  Piedad Climes, MD;  Location: [facility];  Service: Gastroenterology    ROOT CANAL      Front teeth       Family History   Problem Relation Age of Onset    Edema Mother     Alzheimer's disease Father     Aortic dissection Brother     Early death Brother     Aneurysm Brother        Social History     Tobacco Use    Smoking status: Never    Smokeless tobacco: Never   Vaping Use    Vaping status: Never Used   Substance Use Topics    Alcohol use: Not Currently    Drug use: Not Currently       OBJECTIVE:  BP 137/68  - Pulse 75  - Temp 36.6 ??C (97.9 ??F) (Oral)  - Resp 17  - Ht 172.7 cm (5' 8)  - Wt 86.5 kg (190 lb 11.2 oz)  - SpO2 92%  - BMI 29.00 kg/m??   Wt Readings from Last 12 Encounters:   10/10/22 86.5 kg (190 lb 11.2 oz)   08/16/22 86.5 kg (190 lb 11.2 oz)   07/29/22 86.5 kg (190 lb 11.2 oz)   07/18/22 86 kg (189 lb 9.6 oz)   05/06/22 87.5 kg (193 lb)   04/08/22 86.7 kg (191 lb 1.6 oz)   04/08/22 86.7 kg (191 lb 1.6 oz)   03/11/22 86 kg (189 lb 11.2 oz)   02/25/22 88.6 kg (195 lb 4.8 oz)   02/25/22 88.6 kg (195 lb 4.8 oz)   02/18/22 96.3 kg (212 lb 3.2 oz)   02/11/22 94.8 kg (209 lb)     Physical Exam  Vitals and nursing note reviewed.   Constitutional:       General: He is not in acute distress.     Comments: Lying in bed   HENT:      Head: Normocephalic.      Mouth/Throat:      Mouth: Mucous membranes are moist.   Eyes:      Extraocular Movements: Extraocular movements intact.      Conjunctiva/sclera: Conjunctivae normal.   Pulmonary:      Effort: Pulmonary effort is normal. No respiratory distress.   Abdominal:      General: There is no distension.      Palpations: Abdomen is soft.      Tenderness: There is no abdominal tenderness.   Skin:     General: Skin is warm and dry.   Neurological:      Mental Status: He is alert and oriented to person, place, and time.   Psychiatric:         Mood and Affect: Mood normal.         Behavior: Behavior normal.           BG/insulin reviewed per EMR.   Glucose, POC (mg/dL)   Date Value   27/25/3664 269 (H)   10/10/2022 102   10/10/2022 102   08/01/2022 141   07/19/2022 119   07/19/2022 127   07/18/2022 188 (H)   07/18/2022 112        Summary of labs:  Lab Results   Component Value Date    A1C 7.5 (H) 08/16/2022    A1C 6.5 (H) 05/06/2022    A1C 5.2 02/05/2022     Lab Results   Component Value Date    CREATININE 1.37 (H) 10/11/2022     Lab  Results   Component Value Date    WBC 5.7 10/11/2022    HGB 12.4 (L) 10/11/2022    HCT 37.2 (L) 10/11/2022    PLT 135 (L) 10/11/2022       Lab Results   Component Value Date    NA 139 10/11/2022    K 4.2 10/11/2022    CL 105 10/11/2022    CO2 29.0 10/11/2022    BUN 22 10/11/2022    CREATININE 1.37 (H) 10/11/2022    GLU 166 10/11/2022    CALCIUM 9.0 10/11/2022    MG 1.7 10/11/2022    PHOS 4.1 10/11/2022       Lab Results   Component Value Date    BILITOT 0.3 10/11/2022    BILIDIR 0.20 10/11/2022    PROT 6.1 10/11/2022    ALBUMIN 3.1 (L) 10/11/2022    ALT 233 (H) 10/11/2022    AST 120 (H) 10/11/2022    ALKPHOS 356 (H) 10/11/2022    GGT 109 (H) 10/10/2022

## 2022-10-12 LAB — CBC
HEMATOCRIT: 38.1 % — ABNORMAL LOW (ref 39.0–48.0)
HEMOGLOBIN: 12.6 g/dL — ABNORMAL LOW (ref 12.9–16.5)
MEAN CORPUSCULAR HEMOGLOBIN CONC: 33 g/dL (ref 32.0–36.0)
MEAN CORPUSCULAR HEMOGLOBIN: 25.6 pg — ABNORMAL LOW (ref 25.9–32.4)
MEAN CORPUSCULAR VOLUME: 77.6 fL (ref 77.6–95.7)
MEAN PLATELET VOLUME: 9.9 fL (ref 6.8–10.7)
PLATELET COUNT: 140 10*9/L — ABNORMAL LOW (ref 150–450)
RED BLOOD CELL COUNT: 4.91 10*12/L (ref 4.26–5.60)
RED CELL DISTRIBUTION WIDTH: 15.1 % (ref 12.2–15.2)
WBC ADJUSTED: 5.4 10*9/L (ref 3.6–11.2)

## 2022-10-12 LAB — HEPATIC FUNCTION PANEL
ALBUMIN: 3.1 g/dL — ABNORMAL LOW (ref 3.4–5.0)
ALKALINE PHOSPHATASE: 334 U/L — ABNORMAL HIGH (ref 46–116)
ALT (SGPT): 192 U/L — ABNORMAL HIGH (ref 10–49)
AST (SGOT): 66 U/L — ABNORMAL HIGH (ref <=34)
BILIRUBIN DIRECT: 0.1 mg/dL (ref 0.00–0.30)
BILIRUBIN TOTAL: 0.3 mg/dL (ref 0.3–1.2)
PROTEIN TOTAL: 6.4 g/dL (ref 5.7–8.2)

## 2022-10-12 LAB — BASIC METABOLIC PANEL
ANION GAP: 8 mmol/L (ref 5–14)
BLOOD UREA NITROGEN: 20 mg/dL (ref 9–23)
BUN / CREAT RATIO: 12
CALCIUM: 9 mg/dL (ref 8.7–10.4)
CHLORIDE: 101 mmol/L (ref 98–107)
CO2: 25 mmol/L (ref 20.0–31.0)
CREATININE: 1.63 mg/dL — ABNORMAL HIGH
EGFR CKD-EPI (2021) MALE: 47 mL/min/{1.73_m2} — ABNORMAL LOW (ref >=60)
GLUCOSE RANDOM: 319 mg/dL — ABNORMAL HIGH (ref 70–179)
POTASSIUM: 4.7 mmol/L (ref 3.4–4.8)
SODIUM: 134 mmol/L — ABNORMAL LOW (ref 135–145)

## 2022-10-12 LAB — MAGNESIUM: MAGNESIUM: 1.7 mg/dL (ref 1.6–2.6)

## 2022-10-12 LAB — EVEROLIMUS: EVEROLIMUS LEVEL: 3.8 ng/mL (ref 3.0–15.0)

## 2022-10-12 LAB — TACROLIMUS LEVEL, TIMED: TACROLIMUS BLOOD: 3.7 ng/mL

## 2022-10-12 LAB — PHOSPHORUS: PHOSPHORUS: 3.9 mg/dL (ref 2.4–5.1)

## 2022-10-12 MED ADMIN — DULoxetine (CYMBALTA) DR capsule 60 mg: 60 mg | ORAL | @ 14:00:00

## 2022-10-12 MED ADMIN — insulin NPH (HumuLIN,NovoLIN) injection 7 Units: 7 [IU] | SUBCUTANEOUS | @ 15:00:00 | Stop: 2022-10-12

## 2022-10-12 MED ADMIN — piperacillin-tazobactam (ZOSYN) 3.375 g in sodium chloride 0.9 % (NS) 100 mL IVPB-MBP: 3.375 g | INTRAVENOUS | @ 03:00:00 | Stop: 2022-10-18

## 2022-10-12 MED ADMIN — melatonin tablet 3 mg: 3 mg | ORAL | @ 03:00:00

## 2022-10-12 MED ADMIN — everolimus (immunosuppressive) (ZORTRESS) tablet 2 mg: 2 mg | ORAL | @ 14:00:00

## 2022-10-12 MED ADMIN — insulin lispro (HumaLOG) injection 0-6 Units: 0-6 [IU] | SUBCUTANEOUS | @ 22:00:00

## 2022-10-12 MED ADMIN — heparin (porcine) 5,000 unit/mL injection 5,000 Units: 5000 [IU] | SUBCUTANEOUS | @ 18:00:00

## 2022-10-12 MED ADMIN — insulin lispro (HumaLOG) injection 0-6 Units: 0-6 [IU] | SUBCUTANEOUS | @ 03:00:00

## 2022-10-12 MED ADMIN — amlodipine (NORVASC) tablet 10 mg: 10 mg | ORAL | @ 14:00:00

## 2022-10-12 MED ADMIN — insulin lispro (HumaLOG) injection 0-6 Units: 0-6 [IU] | SUBCUTANEOUS | @ 17:00:00 | Stop: 2022-10-12

## 2022-10-12 MED ADMIN — ursodiol (ACTIGALL) capsule 300 mg: 300 mg | ORAL | @ 14:00:00

## 2022-10-12 MED ADMIN — gabapentin (NEURONTIN) capsule 300 mg: 300 mg | ORAL | @ 18:00:00

## 2022-10-12 MED ADMIN — gabapentin (NEURONTIN) capsule 300 mg: 300 mg | ORAL | @ 14:00:00

## 2022-10-12 MED ADMIN — insulin lispro (HumaLOG) injection 4 Units: 4 [IU] | SUBCUTANEOUS | @ 13:00:00 | Stop: 2022-10-12

## 2022-10-12 MED ADMIN — piperacillin-tazobactam (ZOSYN) 3.375 g in sodium chloride 0.9 % (NS) 100 mL IVPB-MBP: 3.375 g | INTRAVENOUS | @ 21:00:00 | Stop: 2022-10-18

## 2022-10-12 MED ADMIN — tacrolimus (PROGRAF) capsule 2 mg: 2 mg | ORAL | @ 15:00:00

## 2022-10-12 MED ADMIN — insulin NPH (HumuLIN,NovoLIN) injection 5 Units: 5 [IU] | SUBCUTANEOUS | @ 03:00:00

## 2022-10-12 MED ADMIN — heparin (porcine) 5,000 unit/mL injection 5,000 Units: 5000 [IU] | SUBCUTANEOUS | @ 11:00:00

## 2022-10-12 MED ADMIN — insulin lispro (HumaLOG) injection 0-6 Units: 0-6 [IU] | SUBCUTANEOUS | @ 13:00:00 | Stop: 2022-10-12

## 2022-10-12 MED ADMIN — everolimus (immunosuppressive) (ZORTRESS) tablet 1.5 mg: 1.5 mg | ORAL | @ 03:00:00

## 2022-10-12 MED ADMIN — ursodiol (ACTIGALL) capsule 300 mg: 300 mg | ORAL | @ 03:00:00

## 2022-10-12 MED ADMIN — insulin lispro (HumaLOG) injection 4 Units: 4 [IU] | SUBCUTANEOUS | @ 18:00:00 | Stop: 2022-10-12

## 2022-10-12 MED ADMIN — magnesium sulfate 2gm/50mL IVPB: 2 g | INTRAVENOUS | @ 13:00:00 | Stop: 2022-10-12

## 2022-10-12 MED ADMIN — tacrolimus (PROGRAF) capsule 2 mg: 2 mg | ORAL | @ 03:00:00

## 2022-10-12 MED ADMIN — insulin NPH (HumuLIN,NovoLIN) injection 3 Units: 3 [IU] | SUBCUTANEOUS | @ 19:00:00 | Stop: 2022-10-12

## 2022-10-12 MED ADMIN — piperacillin-tazobactam (ZOSYN) 3.375 g in sodium chloride 0.9 % (NS) 100 mL IVPB-MBP: 3.375 g | INTRAVENOUS | @ 16:00:00 | Stop: 2022-10-18

## 2022-10-12 MED ADMIN — gabapentin (NEURONTIN) capsule 300 mg: 300 mg | ORAL | @ 03:00:00

## 2022-10-12 MED ADMIN — insulin lispro (HumaLOG) injection 10 Units: 10 [IU] | SUBCUTANEOUS | @ 22:00:00

## 2022-10-12 MED ADMIN — heparin (porcine) 5,000 unit/mL injection 5,000 Units: 5000 [IU] | SUBCUTANEOUS | @ 03:00:00

## 2022-10-12 MED ADMIN — piperacillin-tazobactam (ZOSYN) 3.375 g in sodium chloride 0.9 % (NS) 100 mL IVPB-MBP: 3.375 g | INTRAVENOUS | @ 09:00:00 | Stop: 2022-10-18

## 2022-10-12 MED ADMIN — ursodiol (ACTIGALL) capsule 300 mg: 300 mg | ORAL | @ 18:00:00

## 2022-10-12 NOTE — Unmapped (Signed)
Biliary & Advanced Endoscopy Consult Service   Treatment Plan         See procedure reports for full details.  In brief, stents were full of stones/sludge so they were removed and duct swept out.  The anastomotic stricture was gone and so no stents were replaced.  Ok to advance diet and de-escalate antibiotics as able.    Cline Crock, MD  Assistant Professor of Medicine  Division of Gastroenterology & Hepatology  Advanced Therapeutic/Pancreaticobiliary Endoscopy  [facility] [place], [place]

## 2022-10-12 NOTE — Unmapped (Signed)
Endocrine Team Diabetes Follow Up Consult Note     Consult information:  Requesting Attending Physician : [name], MD  Service Requesting Consult : Surg Transplant [phone])  Primary Care Provider: [name], MD  Impression:  Matthew Key is a 62 y.o. male admitted for elevated liver enzymes. We have been consulted at the request of [name], MD to evaluate Matthew Key for hyperglycemia.     Medical Decision Making:  Diagnoses:  1.Type 2 Diabetes. Uncontrolled With hyperglycemia.  2. Nutrition: Complicating glycemic control. Increasing risk for both hypoglycemia and hyperglycemia.  3. Transplant. Complicating glycemic control and increasing risk for hyperglycemia.  4.  Elevated Transaminases. Complicating glycemic control and increasing risk for hyperglycemia and hypoglycemia  5. CKD. Complicating glycemic control and increasing risk for hypoglycemia.       Studies reviewed 10/12/22:  Labs: CBC, CMP, POCT-BG, and HbA1C  Interpretation: WBC WNL. Anemia. Hyponatremia. Creatinine elevated and GFR reduced, consistent with known CKD. LFTS remain elevated but trending down overall. Intermittent hyperglycemia, some severe. A1c 7.5% indicating decent glycemic control outpatient.   Notes reviewed: Primary team and nursing notes      Overall impression based on above reviews and history:  Patient with T2DM admitted for elevated liver enzymes s/p OLT. He is known to our service and was restarted on prior inpatient regimen yesterday. He then received 4 mg of dexamethasone during ERCP 3/1 and BG trended up significantly overnight. AM BG 303 mg/dL. Will increase basal insulin by 30% and double nutritional coverage today. We will continue to monitor closely. Expect BG to improve over the next 24 hours as steroid effects wear off.     Recommendations:  - NPH 7u q12h  - lispro 4u qAC  - lispro correction target 140, ISF 30 achs  - Hypoglycemia protocol.  - POCT-BG achs.  - Ensure patient is on glucose precautions if patient taking nutrition by mouth.     Discharge planning:  Given A1c of 7.5%, Matthew Key can resume his home regimen of tresiba 15 units at bedtime, novolog correction, and ozempic 1mg  weekly at discharge.     Thank you for this consult. Discussed plan with primary team. We will continue to follow and make recommendations and place orders as appropriate.    Please page with questions or concerns: [place], [place]: [phone]  Endocrinology Diabetes Care Team on call from 6AM - 3PM on weekdays then endocrine fellow on call: [phone] from 3PM - 6AM on weekdays and on weekends and holidays.   If APP cannot be reached, please page the endocrine fellow on call.      Subjective:  Interval History:  Patient s/p ERCP and stent removal yesterday. He reports feeling well this AM. Denies abdominal pain or N/V. Tolerating a regular diet.    Initial HPI:  Matthew Key is a 62 y.o. 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        He reports feeling fine today. Denies abdominal pain or N/V. Reports tolerating a regular diet. NPO today for possible ERCP.    Diabetes History:  Patient has a history of Type 2 diabetes diagnosed 2017.  Diabetes is managed by: PCP.  Current home diabetes regimen: tresiba 15 units at bedtime, novolog correction and ozempic 1mg  qweek (last dose 2/20).    Current home blood  glucose monitoring:  3-4 times a day.  Hypoglycemia awareness: yes.  Complications related to diabetes: peripheral neuropathy      Current Nutrition:  Active Orders   Diet    Nutrition Therapy Regular/House       ROS: As per HPI.     amlodipine  10 mg Oral Daily    DULoxetine  60 mg Oral Daily    everolimus (immunosuppressive)  2 mg Oral Q AM    And    everolimus (immunosuppressive)  1.5 mg Oral Q PM    gabapentin  300 mg Oral TID    heparin (porcine) for subcutaneous use  5,000 Units Subcutaneous Q8H SCH    insulin lispro  0-6 Units Subcutaneous ACHS    insulin lispro  4 Units Subcutaneous 3xd Meals    insulin NPH  7 Units Subcutaneous Q12H SCH    melatonin  3 mg Oral QPM    piperacillin-tazobactam (ZOSYN) IV (intermittent)  3.375 g Intravenous Q6H    psyllium  1 packet Oral BID    tacrolimus  2 mg Oral BID    ursodiol  300 mg Oral TID       Current Outpatient Medications   Medication Instructions    acetaminophen (TYLENOL) 325-650 mg, Oral, Every 6 hours PRN    amlodipine (NORVASC) 10 mg, Oral, Daily (standard)    aspirin (ECOTRIN) 81 mg, Oral, Daily (standard)    blood sugar diagnostic (ACCU-CHEK GUIDE TEST STRIPS) Strp Use to check blood sugar as directed with insulin 3 times a day & for symptoms of high or low blood sugar    blood-glucose meter kit Use as instructed    blood-glucose meter Misc Use to test blood sugar as directed    DULoxetine (CYMBALTA) 60 mg, Oral, Daily (standard)    everolimus, immunosuppressive, (ZORTRESS) 0.5 mg tablet Take 4 tablets (2 mg total) by mouth every morning AND 3 tablets (1.5 mg total) every evening.    gabapentin (NEURONTIN) 300 mg, Oral, 3 times a day (standard)    glucagon spray 3 mg/actuation Spry Use 1 spray into the left nostril every fifteen (15) minutes as needed (low blood sugar).    hydrOXYzine (VISTARIL) 50 mg, Oral, Nightly PRN    insulin aspart (NOVOLOG FLEXPEN) 100 unit/mL (3 mL) injection pen Inject under the skin per sliding scale prior to meals. If premeal BG 151-200 take 2 additional units for correction, if 201-250 take 4 additional units, etc. Max dose 36 units per day.  Store in-use prefilled pens at room temperature <86??F and use within 28 days; do not refrigerate.    lancets (ACCU-CHEK SOFTCLIX LANCETS) Misc Use to check blood sugar as directed with insulin 3 times a day & for symptoms of high or low blood sugar.    loperamide (IMODIUM) 2 mg capsule Take 1 capsule by mouth three times a day, end after 12/15, then take 1 capsule daily as needed for loose stools    melatonin 3 mg, Oral, Every evening    methocarbamol (ROBAXIN) 500 mg, Oral, 3 times a day PRN    OZEMPIC 0.5 mg, Subcutaneous, Every 7 days    pantoprazole (PROTONIX) 40 mg, Oral, Daily PRN    pen needle, diabetic (BD ULTRA-FINE NANO PEN NEEDLE) 32 gauge x 5/32 (4 mm) Ndle Use as directed  1-2 times per day    psyllium (METAMUCIL) 3.4 gram packet 1 packet, Oral, 2 times a day (standard)    tacrolimus (PROGRAF) 2 mg, Oral, 2 times a day  TRESIBA FLEXTOUCH U-100 15 Units, Subcutaneous, Daily (standard), Adjust as instructed.    ursodiol (ACTIGALL) 300 mg, Oral, 3 times a day (standard)           Past Medical History:   Diagnosis Date    AKI (acute kidney injury) (CMS-HCC) 12/14/2020    Anxiety 10/22/2013    Arthritis     Cervical radiculopathy 12/03/2016    Chronic pain disorder     Lower back    Cirrhosis (CMS-HCC)     Dental abscess 10/2020    Duodenal ulcer 12/01/2017    GERD (gastroesophageal reflux disease)     History of transfusion     Hyperlipidemia 10/22/2013    Hypertension     under control with meds and weight loss    Hypertension associated with diabetes (CMS-HCC) 09/25/2021    Hypotension 01/29/2022    Liver disease     Liver transplant status (CMS-HCC) 02/06/2022    Sleep apnea, obstructive     Have machine    Stroke (CMS-HCC)     mild stroke    Type 2 diabetes mellitus with diabetic neuropathy, with long-term current use of insulin (CMS-HCC) 06/09/2014       Past Surgical History:   Procedure Laterality Date    CHG X-RAY FOR BILE DUCT ENDOSCOPY  08/01/2022    Procedure: ENDOSCOPIC CATHETERIZATION OF THE BILIARY DUCTAL SYSTEM, RADIOLOGICAL SUPERVISION AND INTERPRETATION;  Surgeon: Vonda Antigua, MD;  [facility]: [facility];  Service: Gastroenterology    KNEE SURGERY      PR CATH PLACE/CORON ANGIO, IMG SUPER/INTERP,R&L HRT CATH, L HRT VENTRIC N/A 12/19/2020    Procedure: CATH LEFT/RIGHT HEART CATHETERIZATION;  Surgeon: Rosana Hoes, MD;  Location: [facility] CATH;  Service: Cardiology    PR COLONOSCOPY FLX DX W/COLLJ SPEC WHEN PFRMD N/A 12/07/2021    Procedure: COLONOSCOPY, FLEXIBLE, PROXIMAL TO SPLENIC FLEXURE; DIAGNOSTIC, W/WO COLLECTION SPECIMEN BY BRUSH OR WASH;  Surgeon: Annie Paras, MD;  Location: [facility];  Service: Gastroenterology    PR COLONOSCOPY W/BIOPSY SINGLE/MULTIPLE N/A 07/15/2022    Procedure: COLONOSCOPY, FLEXIBLE, PROXIMAL TO SPLENIC FLEXURE; WITH BIOPSY, SINGLE OR MULTIPLE;  Surgeon: Leland Her, MD;  Location: [facility];  Service: Gastroenterology    PR ERCP BALLOON DILATE BILIARY/PANC DUCT/AMPULLA EA N/A 08/01/2022    Procedure: ERCP;WITH TRANS-ENDOSCOPIC BALLOON DILATION OF BILIARY/PANCREATIC DUCT(S) OR OF AMPULLA, INCLUDING SPHINCTERECTOMY, WHEN PERFOREMD,EACH DUCT (16109);  Surgeon: Vonda Antigua, MD;  Location: [facility];  Service: Gastroenterology    PR ERCP STENT PLACEMENT BILIARY/PANCREATIC DUCT N/A 08/01/2022    Procedure: ENDOSCOPIC RETROGRADE CHOLANGIOPANCREATOGRAPHY (ERCP); WITH PLACEMENT OF ENDOSCOPIC STENT INTO BILIARY OR PANCREATIC DUCT;  Surgeon: Vonda Antigua, MD;  Location: [facility];  Service: Gastroenterology    PR ERCP,SPHINCTEROTOMY N/A 08/01/2022    Procedure: ERCP; W/SPHINCTEROTOMY/PAPILLOTOMY;  Surgeon: Vonda Antigua, MD;  Location: [facility];  Service: Gastroenterology    PR TRANSPLANT LIVER,ALLOTRANSPLANT N/A 02/06/2022    Procedure: LIVER ALLOTRANSPLANTATION; ORTHOTOPIC, PARTIAL OR WHOLE, FROM CADAVER OR LIVING DONOR, ANY AGE;  Surgeon: Florene Glen, MD;  Location: MAIN OR [place];  Service: Transplant    PR TRANSPLANT,PREP DONOR LIVER/ARTERIAL N/A 02/06/2022    Procedure: BACKBNCH RECONSTRUCT OF CAD/LIVE DONOR LIVER GFT PRIOR TRANSPLANT; ARTERIAL ANASTAMOSIS, EA;  Surgeon: Florene Glen, MD; Location: MAIN OR [place];  Service: Transplant    PR UPPER GI ENDOSCOPY,BIOPSY N/A 10/12/2020    Procedure: UGI ENDOSCOPY; WITH BIOPSY, SINGLE OR MULTIPLE;  Surgeon: Marene Lenz, MD;  [facility]: [facility];  Service: Gastroenterology    PR UPPER GI ENDOSCOPY,DIAGNOSIS N/A 01/03/2022    Procedure: UGI ENDO, INCLUDE ESOPHAGUS, STOMACH, & DUODENUM &/OR JEJUNUM; DX W/WO COLLECTION SPECIMN, BY BRUSH OR WASH;  Surgeon: Marene Lenz, MD;  Location: [facility];  Service: Gastroenterology    PR UPPER GI ENDOSCOPY,LIGAT VARIX N/A 12/07/2021    Procedure: UGI ENDO; Everlene Balls LIG ESOPH &/OR GASTRIC VARICES;  Surgeon: Annie Paras, MD;  Location: [facility];  Service: Gastroenterology    ROOT CANAL      Front teeth       Family History   Problem Relation Age of Onset    Edema Mother     Alzheimer's disease Father     Aortic dissection Brother     Early death Brother     Aneurysm Brother        Social History     Tobacco Use    Smoking status: Never    Smokeless tobacco: Never   Vaping Use    Vaping status: Never Used   Substance Use Topics    Alcohol use: Not Currently    Drug use: Not Currently       OBJECTIVE:  BP 147/87  - Pulse 92  - Temp 36.7 ??C (98.1 ??F) (Oral)  - Resp 18  - Ht 172.7 cm (5' 8)  - Wt 81.2 kg (179 lb)  - SpO2 92%  - BMI 27.22 kg/m??   Wt Readings from Last 12 Encounters:   10/11/22 81.2 kg (179 lb)   08/16/22 86.5 kg (190 lb 11.2 oz)   07/29/22 86.5 kg (190 lb 11.2 oz)   07/18/22 86 kg (189 lb 9.6 oz)   05/06/22 87.5 kg (193 lb)   04/08/22 86.7 kg (191 lb 1.6 oz)   04/08/22 86.7 kg (191 lb 1.6 oz)   03/11/22 86 kg (189 lb 11.2 oz)   02/25/22 88.6 kg (195 lb 4.8 oz)   02/25/22 88.6 kg (195 lb 4.8 oz)   02/18/22 96.3 kg (212 lb 3.2 oz)   02/11/22 94.8 kg (209 lb)     Physical Exam  Vitals and nursing note reviewed.   Constitutional:       General: He is not in acute distress.     Comments: Lying in bed   HENT:      Head: Normocephalic.      Mouth/Throat: Mouth: Mucous membranes are moist.   Eyes:      Extraocular Movements: Extraocular movements intact.      Conjunctiva/sclera: Conjunctivae normal.   Pulmonary:      Effort: Pulmonary effort is normal. No respiratory distress.   Skin:     General: Skin is warm and dry.   Neurological:      Mental Status: He is alert and oriented to person, place, and time.   Psychiatric:         Mood and Affect: Mood normal.         Behavior: Behavior normal.           BG/insulin reviewed per EMR.   Glucose, POC (mg/dL)   Date Value   96/11/5407 393 (H)   10/11/2022 481 (HH)   10/11/2022 148   10/11/2022 152   10/11/2022 148   10/10/2022 269 (H)   10/10/2022 102   10/10/2022 102        Summary of labs:  Lab Results   Component Value  Date    A1C 7.5 (H) 08/16/2022    A1C 6.5 (H) 05/06/2022    A1C 5.2 02/05/2022     Lab Results   Component Value Date    CREATININE 1.63 (H) 10/12/2022     Lab Results   Component Value Date    WBC 5.4 10/12/2022    HGB 12.6 (L) 10/12/2022    HCT 38.1 (L) 10/12/2022    PLT 140 (L) 10/12/2022       Lab Results   Component Value Date    NA 134 (L) 10/12/2022    K 4.7 10/12/2022    CL 101 10/12/2022    CO2 25.0 10/12/2022    BUN 20 10/12/2022    CREATININE 1.63 (H) 10/12/2022    GLU 319 (H) 10/12/2022    CALCIUM 9.0 10/12/2022    MG 1.7 10/12/2022    PHOS 3.9 10/12/2022       Lab Results   Component Value Date    BILITOT 0.3 10/12/2022    BILIDIR 0.10 10/12/2022    PROT 6.4 10/12/2022    ALBUMIN 3.1 (L) 10/12/2022    ALT 192 (H) 10/12/2022    AST 66 (H) 10/12/2022    ALKPHOS 334 (H) 10/12/2022    GGT 109 (H) 10/10/2022

## 2022-10-12 NOTE — Unmapped (Signed)
Problem: Adult Inpatient Plan of Care  Goal: Plan of Care Review  Outcome: Progressing  Goal: Patient-Specific Goal (Individualized)  Outcome: Progressing  Goal: Absence of Hospital-Acquired Illness or Injury  Outcome: Progressing  Goal: Optimal Comfort and Wellbeing  Outcome: Progressing  Goal: Readiness for Transition of Care  Outcome: Progressing  Goal: Rounds/Family Conference  Outcome: Progressing     Problem: Fall Injury Risk  Goal: Absence of Fall and Fall-Related Injury  Outcome: Progressing

## 2022-10-12 NOTE — Unmapped (Signed)
Transplant Surgery Progress Note    Service Date: 10/12/2022  Admit Date: 10/10/2022, Hospital Day: 3  Hospital Service: Surg Transplant [facility])  Attending: [name], MD    Assessment     [facility] is a 62 y.o. 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 Now s/p ERCP 3/1 with obstructed stent removal, stricture resolved.        Interval Events   NAEO, afebrile. ERCP with stones / sludge obstructing stents. Stents removed. LFTs down-trending this AM, worsening hyperglycemia. Patient tolerating regular diet, voiding adequately.     Plan     Neuro: reports no pain   -gabapentin 300mg  TID  -duloxetine 60mg  qd  -atarax 50mg  qhs PRN     CV: HDS  - amlodipine 10mg  qd    Pulm: stable on room air  - NAI    F: [employer].  E: No active electrolyte abnormalities  N:Nutrition Therapy Regular/House.    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   -  NAI    Heme/ID:   - zosyn 3.375g q6h 3/1-3/7  - daily CBC    Endo:   - insulin NPH 5u q12h  - SSI lispro ACHS  - endocrinology consulted, appreciate recs     Prophylaxis:  - DVT: SCDs.   - Everolimus 2mg  qd and 1.5mg  qhs  - tacrlimus 2mg  BID  - daily tacrolimus, everolimus level    Disposition: Floor status.   -PT/OT not indicated  -Home health needs: none      Objective     Vitals:   Temp:  [36.4 ??C (97.5 ??F)-36.8 ??C (98.2 ??F)] 36.6 ??C (97.9 ??F)  Heart Rate:  [70-92] 78  SpO2 Pulse:  [73-79] 76  Resp:  [15-18] 18  BP: (125-184)/(65-89) 173/89  MAP (mmHg):  [89-116] 114  SpO2:  [92 %-96 %] 92 %  BMI (Calculated):  [27.22] 27.22    Physical Exam:  -General:  Appropriate, comfortable and in no apparent distress.   -Neurological: Moves all 4 extremities spontaneously.   -Cardiovascular: HDS  -Pulmonary: Normal work of breathing on RA  -Abdomen: Soft, non-tender, non-distended. No rebound or guarding. Well healed mercedes incision  -Extremities: Warm, well perfused.     Recent Imaging:  ERCP    Result Date: 10/11/2022  _______________________________________________________________________________ Patient Name: Matthew Key           Procedure Date: 10/11/2022 4:31 PM MRN: [id number]                     Date of Birth: 1960/09/28 Admit Type: Inpatient                 Age: 46 Room: [facility]          Gender: Male Note Status: Finalized                Instrument Name: [id number] _______________________________________________________________________________  Procedure:             ERCP Indications:           Suspected bile duct stone(s), Elevated liver enzymes,  Post liver transplant assessment Providers:             Kela Millin, MD, Kandice Robinsons, RN,                        [facility] Referring MD:          [name], MD (Referring MD) Medicines:             General Anesthesia, (the patient had recently received                        zosyn) Complications:         No immediate complications. Estimated blood loss:                        Minimal. _______________________________________________________________________________ Procedure:             Pre-Anesthesia Assessment:                        - Prior to the procedure, a History and Physical was                        performed, and patient medications and allergies were                        reviewed. The patient's tolerance of previous                        anesthesia was also reviewed. The risks and benefits                        of the procedure and the sedation options and risks                        were discussed with the patient. All questions were                        answered, and informed consent was obtained. Prior                        Anticoagulants: The patient has taken no anticoagulant                        or antiplatelet agents. ASA Grade Assessment: IV - A                        patient with severe systemic disease that is a                        constant threat to life. After reviewing the risks and                        benefits, the patient was deemed in satisfactory                        condition to undergo the procedure.                        After obtaining informed consent, the scope was passed  under direct vision. Throughout the procedure, the                        patient's blood pressure, pulse, and oxygen                        saturations were monitored continuously. The                        Duodenoscope was introduced through the mouth, and                        advanced to the duodenum and used to inject contrast                        into the bile duct. The ERCP was accomplished without                        difficulty. The patient tolerated the procedure well.                                                                                Findings:      No biopsies or other specimens were collected for this exam. A scout      film of the abdomen was obtained. Three stents ending in the main bile      duct were seen. The esophagus was successfully intubated under direct      vision without detailed examination of the pharynx, larynx, and      associated structures, and upper GI tract. The upper GI tract was      grossly normal. Three covered metal plastic biliary stents originating      in the biliary tree were emerging from the major papilla. The stents      were visibly occluded. A biliary sphincterotomy had been performed. The      sphincterotomy appeared open. The bile duct was deeply cannulated with      the 11.5 mm balloon. Contrast was injected. I personally interpreted the      bile duct images. There was brisk flow of contrast through the ducts. Image quality was excellent. Contrast extended to the entire biliary      tree. The main bile duct contained filling defect(s) thought to be a      stone and sludge. The upper third of the main bile duct was markedly      dilated and diffusely dilated, acquired. The largest diameter was 17 mm.      A 0.025 inch x 270 cm angled Visiglide wire was passed into the biliary      tree. The biliary tree was swept with an 11.5 mm balloon starting at the      middle third of the main bile duct. Sludge was swept from the duct. Many      stones were removed. Many stones remained. Attempts were made to sweep      the stents alone rather than remove given the recency in placement and      stricture for  which they were placed. However, these sweeps led to      migration that would not be optimal to leave the patient with, so the      stents were removed. Three stents were removed from the biliary tree      using a Raptor grasping device. The biliary tree was swept with an 11.5      mm balloon and 15 mm balloon starting at the left main hepatic duct and      right main hepatic duct. Sludge was swept from the duct. Many stones      were removed. No stones remained. A repeat cholangiogram was obtained      and this demonstrated no significant residual anastomotic stenosis.      There was faint resistance on sweeping the 15mm balloon through this      area likely related to the size of the balloon and not of clinical      significance. The decision was thus to not replace any stents.                                                                                Estimated Blood Loss:      Estimated blood loss was minimal. Impression:            - No specimens collected.                        - Three visibly occluded stents from the biliary tree                        were seen in the major papilla. Removed.                        - Prior biliary sphincterotomy appeared open.                        - Choledocholithiasis was found. Complete removal was                        accomplished by balloon extraction.                        - The anastomotic stricture had resolved and so stents                        were not replaced. Recommendation:        - Return patient to hospital ward for ongoing care.                        - Advance diet as tolerated.                        - Continue present medications, including antibiotics.                        Ok to rapidly de-escalate if clinical condition  improves, from a biliary perspective.                        - Observe patient's clinical course following today's                        ERCP with therapeutic intervention.                        - Return to referring physician.                                                                                Procedure Code(s):     --- Professional ---                        [phone], Endoscopic retrograde cholangiopancreatography                        (ERCP); with removal of foreign body(s) or stent(s)                        from biliary/pancreatic duct(s)                        43264, Endoscopic retrograde cholangiopancreatography                        (ERCP); with removal of calculi/debris from                        biliary/pancreatic duct(s)                        60454, Endoscopic catheterization of the biliary                        ductal system, radiological supervision and                        interpretation Diagnosis Code(s):     --- Professional ---                        U98.119J, Other mechanical complication of bile duct                        prosthesis, initial encounter                        K80.50, Calculus of bile duct without cholangitis or                        cholecystitis without obstruction                        R74.8, Abnormal levels of other serum enzymes                        Z09,  Encounter for follow-up examination after                        completed treatment for conditions other than                        malignant neoplasm                        Z94.4, Liver transplant status     CPT copyright 2022 [employer]. All rights reserved.     The codes documented in this report are preliminary and upon coder review may be revised to meet current compliance requirements. Attending Participation:      I personally performed the entire procedure.                                                                                    Electronically Signed By Cline Crock, MD _______________________ Kela Millin, MD 10/11/2022 6:01:14 PM The attending physician was present throughout the entire procedure including the insertion, viewing, and removal of the endoscope. This procedure note has been electronically signed by: Cline Crock , MD Number of Addenda: 0     Note Initiated On: 10/11/2022 4:31 PM    [place] [facility] (No Interpretation)    Result Date: 10/11/2022  Interpretation will follow in operative report.    MRI Abdomen W Wo Contrast MRCP    Result Date: 10/10/2022  EXAM: MRI abdomen with and without contrast, MRCP ACCESSION: [id number] UN CLINICAL INDICATION: 62 years old with Transient and a liver transplant patient      COMPARISON: CT abdomen/pelvis 08/02/2022, MRI abdomen 07/28/2022     TECHNIQUE: MRI of the abdomen was obtained with and without IV contrast.  Multisequence, multiplanar and dedicated T2 weighted images that highlight the biliary tree were obtained.         FINDINGS:     LOWER CHEST: Trace right pleural effusion.     ABDOMEN:     HEPATOBILIARY: Postoperative changes of liver transplant. Hepatic steatosis. Patchy mildly increased T2 signal scattered throughout the liver (for example 5:13, 16). There is a 0.8 cm hypoenhancing hepatic 6 lesion, which appears relatively stable compared to prior (18:52) no new lesions are visualized.     Gallbladder is surgically absent. Common bile duct stent is seen. However, the common hepatic duct measures up to 1.8 cm. By comparison, it measured up to 1.4 cm prior to stent placement. Moderate intrahepatic biliary ductal dilation is noted. Subtle enhancement is seen adjacent to the proximal intrahepatic bile ducts. Pneumobilia.     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 No pancreatic ductal dilation. Mild pancreatic atrophy. SPLEEN: Unremarkable. ADRENAL GLANDS: Unremarkable. KIDNEYS/URETERS: Symmetric nephrograms. No hydronephrosis. BOWEL/PERITONEUM/RETROPERITONEUM: No bowel obstruction. No acute inflammatory process No ascites. VASCULATURE: Normal caliber abdominal aorta. Atherosclerotic calcifications. Portal and hepatic veins are patent. Unremarkable inferior vena cava. LYMPH NODES: No adenopathy.     BONES/SOFT TISSUES: No aggressive osseous lesions. Postsurgical changes of the anterior abdominal  wall. Bilateral gynecomastia.         -Moderate dilation of the hepatic duct and intrahepatic biliary system upstream from the common bile duct stent. Of note, the degree of biliary dilation is greater than that seen prior to stent placement. Recommend correlation with laboratory values. Additionally, recommend GI consultation for further assessment and consideration of potential stent interrogation.     - Subtle enhancement is seen adjacent to the proximal intrahepatic biliary ducts, with patchy mildly increased T2 signal in the liver parenchyma. Finding is nonspecific however recommend correlation with clinical exam to exclude underlying infectious etiology such as cholangitis.     - Stable peripheral hypoenhancing hepatic segment 6 lesion.     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     ==================== MODIFIED REPORT: (10/10/2022 8:40 PM) This report has been modified from its preliminary version; you may check the prior versions of radiology report, results history link for prior report versions (if they were previously visible in Epic).     -----------------------------------------------    US Liver Transplant    Result Date: 10/10/2022  EXAM: US LIVER TRANSPLANT ACCESSION: [id number] UN     CLINICAL INDICATION: 62 years old with s/p OLT with elevated LFTs      COMPARISON: 08/02/2022 CT abdomen and pelvis and priors     TECHNIQUE: Ultrasound views of the complete abdomen were obtained using gray scale and color and spectral Doppler imaging.     FINDINGS:     HEPATOBILIARY: The liver shows diffuse increased echogenicity. No focal hepatic lesions. Mild central intrahepatic biliary ductal dilatation. The common bile duct is dilated. The gallbladder is surgically absent. Pneumobilia is present. There is likely a stent in the common bile duct.      Liver: 15.9 cm      Common bile duct: 1.5 cm     PANCREAS: Not well visualized..     SPLEEN: Normal in size and echotexture.       Spleen: 12.4 cm     KIDNEYS: Normal in size and echotexture. No solid masses or calculi. No hydronephrosis. Limited assessment due to overlying bowel gas.      Right kidney: 10.7 cm      Left kidney: 11.3 cm     VESSELS: - Portal vein: The main, left and right portal veins are patent with hepatopetal flow. Normal main portal vein velocity (0.20 m/s or greater)      Main portal vein diameter: 1.3 cm      Main portal vein pre anastomosis velocity: 0.34 m/s      Main portal vein anastomosis velocity: 0.37 m/s      Main portal vein post anastomosis velocity: 0.29 m/s      Anterior right portal vein velocity: 0.20 m/s      Posterior right portal vein velocity: 0.25 m/s      Left portal vein velocity: 0.17 m/s      Right portal vein flow: hepatopetal      Left portal vein flow: hepatopetal     - Splenic vein: Patent, with hepatopetal flow.      Splenic vein midline: hepatopetal      Splenic vein proximal: hepatopetal     - Hepatic veins/IVC: The IVC, left, middle and right hepatic veins are were patent. The right and middle hepatic veins are poorly visualized. The left hepatic vein could not be visualized.      Left hepatic vein phasicity/flow: not seen      Middle  hepatic vein phasicity/flow: bi-tri      Right hepatic vein phasicity/flow: bi-tri      Inferior vena cava phasicity/flow: monophasic     - Hepatic artery: Patent with resistive indices within normal limits.      Proper hepatic artery resistive index: 0.75 and systolic acceleration time 21 msec      Right hepatic artery resistive index: 0.75 and systolic acceleration time 21 msec      Left hepatic artery resistive index: 0.73 and systolic acceleration time 21 msec     - Visualized proximal aorta: Atherosclerotic changes.     OTHER: No ascites.         The left hepatic vein could not be visualized which could be secondary to technical reasons and overlying bowel gas. The right and middle hepatic veins are also poorly visualized although they are likely patent. The portal venous system and hepatic arteries are also patent. The portal venous system demonstrates normal flow direction. Normal resistive indices of the hepatic arteries. Common bile duct stent with pneumobilia. The common bile duct appears dilated with stent. Mild central intrahepatic biliary duct dilatation. Further assessment with MRI and MRCP could be considered due to the presence of elevated liver enzymes.     Please see below for data measurements:         Liver: 15.9 cm     Common bile duct: 1.5 cm     Right kidney: 10.7 cm Left kidney: 11.3 cm     Main portal vein diameter: 1.3 cm     Main portal vein pre anastomosis velocity: 0.34 m/s Main portal vein anastomosis velocity: 0.37 m/s Main portal vein post anastomosis velocity: 0.29 m/s Anterior right portal vein velocity: 0.20 m/s Posterior right portal vein velocity: 0.25 m/s Left portal vein velocity: 0.17 m/s     Right portal vein flow: hepatopetal Left portal vein flow: hepatopetal     Common hepatic artery resistive index: 0.75 and systolic acceleration time 21 msec Right hepatic artery resistive index: 0.75 and systolic acceleration time 21 msec Left hepatic artery resistive index: 0.73 and systolic acceleration time 21 msec     Left hepatic vein flow: not seen Middle hepatic vein flow: bi-tri Right hepatic vein flow: bi-tri Inferior vena cava flow: monophasic     Splenic vein midline: hepatopetal Splenic vein proximal: hepatopetal     Aorta: partially visualized Inferior vena cava: Partially visualized     Spleen: 12.4 cm     Abdominal free fluid visualized: no             XR Abdomen 1 View    Result Date: 10/10/2022  EXAM: XR ABDOMEN 1 VIEW ACCESSION: [id number] UN     CLINICAL INDICATION: 62 years old with Eval stent placement ; OTHER  - Z94.4 - Liver replaced by transplant (CMS - HCC)      COMPARISON: Abdominal radiograph 08/16/2022, CT abdomen pelvis 08/02/2022     TECHNIQUE: Supine view of the abdomen, 3 images(s)     FINDINGS: Lung bases are clear. Degenerative changes of the spine and bilateral hip joints. Surgical clips overlie the right upper quadrant and left upper quadrant. Biliary stent overlies the right upper quadrant. Pancreaticoduodenal stent crosses midline and overlies the L2 vertebral body.     Pneumobilia, similar to prior. Nonobstructive bowel gas pattern with moderate colonic stool burden. Vascular calcifications.         Stable position of the biliary stent projecting over the anticipated location of the common bile duct and the  pancreatic pigtail stent overlying the anticipated location of the pancreas and duodenum.

## 2022-10-12 NOTE — Unmapped (Signed)
Social Work  Psychosocial Assessment    Patient Name: Matthew Key   Medical Record Number: [id number]   Date of Birth: [birth year]  Sex: Male     Referral  Referred by: Care Manager  Reason for Referral: Complex Discharge Planning  No Psychosocial Interventions Necessary: No Psychosocial Interventions Necessary    Extended Emergency Contact Information  Primary Emergency Contact: Boudoin,Sherry  Mobile Phone: [phone]  Relation: Spouse  Secondary Emergency Contact: Wolfgang,Tejon [place]  Mobile Phone: [phone]  Relation: Son    Legal Next of Kin / Guardian / POA / Advance Directives    HCDM (patient stated preference): Wager,Sherry - Spouse - [phone]    HCDM, back-up (If primary HCDM is unavailable): Cin, Stutzman - Son [phone]    Advance Directive (Medical Treatment)  Does patient have an advance directive covering medical treatment?: Patient does not have advance directive covering medical treatment.  Reason patient does not have an advance directive covering medical treatment:: Patient does not wish to complete one at this time.    Health Care Decision Maker [HCDM] (Medical & Mental Health Treatment)  Healthcare Decision Maker: Patient does not wish to appoint a Health Care Decision Maker at this time  Information offered on HCDM, Medical & Mental Health advance directives:: Patient declined information.         Discharge Planning  Discharge Planning Information:   Type of Residence   Mailing Address:  [address]  [place] [address]    Medical Information   Past Medical History:   Diagnosis Date    AKI (acute kidney injury) (CMS-HCC) 12/14/2020    Anxiety 10/22/2013    Arthritis     Cervical radiculopathy 12/03/2016    Chronic pain disorder     Lower back    Cirrhosis (CMS-HCC)     Dental abscess 10/2020    Duodenal ulcer 12/01/2017    GERD (gastroesophageal reflux disease)     History of transfusion     Hyperlipidemia 10/22/2013    Hypertension     under control with meds and weight loss    Hypertension associated with diabetes (CMS-HCC) 09/25/2021    Hypotension 01/29/2022    Liver disease     Liver transplant status (CMS-HCC) 02/06/2022    Sleep apnea, obstructive     Have machine    Stroke (CMS-HCC)     mild stroke    Type 2 diabetes mellitus with diabetic neuropathy, with long-term current use of insulin (CMS-HCC) 06/09/2014       Past Surgical History:   Procedure Laterality Date    CHG X-RAY FOR BILE DUCT ENDOSCOPY  08/01/2022    Procedure: ENDOSCOPIC CATHETERIZATION OF THE BILIARY DUCTAL SYSTEM, RADIOLOGICAL SUPERVISION AND INTERPRETATION;  Surgeon: Vonda Antigua, MD;  [facility];  Service: Gastroenterology    KNEE SURGERY      PR CATH PLACE/CORON ANGIO, IMG SUPER/INTERP,R&L HRT CATH, L HRT VENTRIC N/A 12/19/2020    Procedure: CATH LEFT/RIGHT HEART CATHETERIZATION;  Surgeon: Rosana Hoes, MD;  Location: [facility] CATH;  Service: Cardiology    PR COLONOSCOPY FLX DX W/COLLJ SPEC WHEN PFRMD N/A 12/07/2021    Procedure: COLONOSCOPY, FLEXIBLE, PROXIMAL TO SPLENIC FLEXURE; DIAGNOSTIC, W/WO COLLECTION SPECIMEN BY BRUSH OR WASH;  Surgeon: Annie Paras, MD;  [facility];  Service: Gastroenterology    PR COLONOSCOPY W/BIOPSY SINGLE/MULTIPLE N/A 07/15/2022    Procedure: COLONOSCOPY, FLEXIBLE, PROXIMAL TO SPLENIC FLEXURE; WITH BIOPSY, SINGLE OR MULTIPLE;  Surgeon: Leland Her, MD;  Location: [facility];  Service: Gastroenterology    PR ERCP BALLOON DILATE BILIARY/PANC DUCT/AMPULLA EA N/A 08/01/2022    Procedure: ERCP;WITH TRANS-ENDOSCOPIC BALLOON DILATION OF BILIARY/PANCREATIC DUCT(S) OR OF AMPULLA, INCLUDING SPHINCTERECTOMY, WHEN PERFOREMD,EACH DUCT ([address]109);  Surgeon: Vonda Antigua, MD;  Location: [facility];  Service: Gastroenterology    PR ERCP STENT PLACEMENT BILIARY/PANCREATIC DUCT N/A 08/01/2022    Procedure: ENDOSCOPIC RETROGRADE CHOLANGIOPANCREATOGRAPHY (ERCP); WITH PLACEMENT OF ENDOSCOPIC STENT INTO BILIARY OR PANCREATIC DUCT;  Surgeon: Vonda Antigua, MD;  Location: [facility];  Service: Gastroenterology    PR ERCP,SPHINCTEROTOMY N/A 08/01/2022    Procedure: ERCP; W/SPHINCTEROTOMY/PAPILLOTOMY;  Surgeon: Vonda Antigua, MD;  Location: [facility];  Service: Gastroenterology    PR TRANSPLANT LIVER,ALLOTRANSPLANT N/A 02/06/2022    Procedure: LIVER ALLOTRANSPLANTATION; ORTHOTOPIC, PARTIAL OR WHOLE, FROM CADAVER OR LIVING DONOR, ANY AGE;  Surgeon: Florene Glen, MD;  Location: MAIN OR [place];  Service: Transplant    PR TRANSPLANT,PREP DONOR LIVER/ARTERIAL N/A 02/06/2022    Procedure: BACKBNCH RECONSTRUCT OF CAD/LIVE DONOR LIVER GFT PRIOR TRANSPLANT; ARTERIAL ANASTAMOSIS, EA;  Surgeon: Florene Glen, MD;  Location: MAIN OR [place];  Service: Transplant    PR UPPER GI ENDOSCOPY,BIOPSY N/A 10/12/2020    Procedure: UGI ENDOSCOPY; WITH BIOPSY, SINGLE OR MULTIPLE;  Surgeon: Marene Lenz, MD;  [facility]: [facility];  Service: Gastroenterology    PR UPPER GI ENDOSCOPY,DIAGNOSIS N/A 01/03/2022    Procedure: UGI ENDO, INCLUDE ESOPHAGUS, STOMACH, & DUODENUM &/OR JEJUNUM; DX W/WO COLLECTION SPECIMN, BY BRUSH OR WASH;  Surgeon: Marene Lenz, MD;  [facility]: [facility];  Service: Gastroenterology    PR UPPER GI ENDOSCOPY,LIGAT VARIX N/A 12/07/2021    Procedure: UGI ENDO; Everlene Balls LIG ESOPH &/OR GASTRIC VARICES;  Surgeon: Annie Paras, MD;  Location: [facility];  Service: Gastroenterology    ROOT CANAL      Front teeth       Family History   Problem Relation Age of Onset    Edema Mother     Alzheimer's disease Father     Aortic dissection Brother     Early death Brother     Aneurysm Brother        Financial Information   Primary Insurance: Payor: HUMANA MEDICARE ADV / Plan: HUMANA GOLD PLUS HMO / Product Type: *No Product type* /    Secondary Insurance: None   Prescription Coverage: Nurse, learning disability (listed above)   Preferred Pharmacy: [facility]  [facility] [phone] - [place], [place] - [address] AT [address]  [id number] - [place], [place] - [address]  [id number] - [place],  - [address]    Barriers to taking medication: No    Transition Home   Transportation at time of discharge: Family/Friend's Private Vehicle    Anticipated changes related to Illness:  s/p liver transplant < 1 year    Services in place prior to admission: N/A   Services anticipated for DC: N/A   Hemodialysis Prior to Admission: No    Readmission  Risk of Unplanned Readmission Score: UNPLANNED READMISSION SCORE: 25.72%  Readmitted Within the Last 30 Days?   Readmission Factors include: other: s/p liver transplant patient     Social Determinants of Health  Social Determinants of Health were addressed  in provider documentation.  Please refer to patient history.    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 When patient is healthy enough, he can drive himself.

## 2022-10-13 LAB — CBC
HEMATOCRIT: 37 % — ABNORMAL LOW (ref 39.0–48.0)
HEMOGLOBIN: 12.3 g/dL — ABNORMAL LOW (ref 12.9–16.5)
MEAN CORPUSCULAR HEMOGLOBIN CONC: 33.1 g/dL (ref 32.0–36.0)
MEAN CORPUSCULAR HEMOGLOBIN: 25.7 pg — ABNORMAL LOW (ref 25.9–32.4)
MEAN CORPUSCULAR VOLUME: 77.6 fL (ref 77.6–95.7)
MEAN PLATELET VOLUME: 9.6 fL (ref 6.8–10.7)
PLATELET COUNT: 136 10*9/L — ABNORMAL LOW (ref 150–450)
RED BLOOD CELL COUNT: 4.77 10*12/L (ref 4.26–5.60)
RED CELL DISTRIBUTION WIDTH: 15.2 % (ref 12.2–15.2)
WBC ADJUSTED: 6.1 10*9/L (ref 3.6–11.2)

## 2022-10-13 LAB — BASIC METABOLIC PANEL
ANION GAP: 7 mmol/L (ref 5–14)
BLOOD UREA NITROGEN: 16 mg/dL (ref 9–23)
BUN / CREAT RATIO: 11
CALCIUM: 8.8 mg/dL (ref 8.7–10.4)
CHLORIDE: 105 mmol/L (ref 98–107)
CO2: 28 mmol/L (ref 20.0–31.0)
CREATININE: 1.45 mg/dL — ABNORMAL HIGH
EGFR CKD-EPI (2021) MALE: 54 mL/min/{1.73_m2} — ABNORMAL LOW (ref >=60)
GLUCOSE RANDOM: 215 mg/dL — ABNORMAL HIGH (ref 70–179)
POTASSIUM: 4.2 mmol/L (ref 3.4–4.8)
SODIUM: 140 mmol/L (ref 135–145)

## 2022-10-13 LAB — PHOSPHORUS: PHOSPHORUS: 4.5 mg/dL (ref 2.4–5.1)

## 2022-10-13 LAB — TACROLIMUS LEVEL, TIMED: TACROLIMUS BLOOD: 4.7 ng/mL

## 2022-10-13 LAB — HEPATIC FUNCTION PANEL
ALBUMIN: 3 g/dL — ABNORMAL LOW (ref 3.4–5.0)
ALKALINE PHOSPHATASE: 277 U/L — ABNORMAL HIGH (ref 46–116)
ALT (SGPT): 129 U/L — ABNORMAL HIGH (ref 10–49)
AST (SGOT): 33 U/L (ref <=34)
BILIRUBIN DIRECT: <0.1 mg/dL (ref 0.00–0.30)
BILIRUBIN TOTAL: 0.2 mg/dL — ABNORMAL LOW (ref 0.3–1.2)
PROTEIN TOTAL: 6.1 g/dL (ref 5.7–8.2)

## 2022-10-13 LAB — EVEROLIMUS: EVEROLIMUS LEVEL: 4.9 ng/mL (ref 3.0–15.0)

## 2022-10-13 LAB — MAGNESIUM: MAGNESIUM: 2 mg/dL (ref 1.6–2.6)

## 2022-10-13 MED ADMIN — insulin lispro (HumaLOG) injection 10 Units: 10 [IU] | SUBCUTANEOUS | @ 15:00:00

## 2022-10-13 MED ADMIN — ursodiol (ACTIGALL) capsule 300 mg: 300 mg | ORAL | @ 20:00:00

## 2022-10-13 MED ADMIN — insulin lispro (HumaLOG) injection 10 Units: 10 [IU] | SUBCUTANEOUS

## 2022-10-13 MED ADMIN — amlodipine (NORVASC) tablet 10 mg: 10 mg | ORAL | @ 14:00:00

## 2022-10-13 MED ADMIN — ursodiol (ACTIGALL) capsule 300 mg: 300 mg | ORAL | @ 14:00:00

## 2022-10-13 MED ADMIN — piperacillin-tazobactam (ZOSYN) 3.375 g in sodium chloride 0.9 % (NS) 100 mL IVPB-MBP: 3.375 g | INTRAVENOUS | @ 02:00:00 | Stop: 2022-10-18

## 2022-10-13 MED ADMIN — gabapentin (NEURONTIN) capsule 300 mg: 300 mg | ORAL | @ 20:00:00

## 2022-10-13 MED ADMIN — amoxicillin-clavulanate (AUGMENTIN) 875-125 mg per tablet 1 tablet: 875 mg | ORAL | @ 14:00:00 | Stop: 2022-10-16

## 2022-10-13 MED ADMIN — ursodiol (ACTIGALL) capsule 300 mg: 300 mg | ORAL | @ 02:00:00

## 2022-10-13 MED ADMIN — melatonin tablet 3 mg: 3 mg | ORAL | @ 02:00:00

## 2022-10-13 MED ADMIN — heparin (porcine) 5,000 unit/mL injection 5,000 Units: 5000 [IU] | SUBCUTANEOUS | @ 20:00:00

## 2022-10-13 MED ADMIN — gabapentin (NEURONTIN) capsule 300 mg: 300 mg | ORAL | @ 02:00:00

## 2022-10-13 MED ADMIN — everolimus (immunosuppressive) (ZORTRESS) tablet 2 mg: 2 mg | ORAL | @ 14:00:00

## 2022-10-13 MED ADMIN — insulin lispro (HumaLOG) injection 10 Units: 10 [IU] | SUBCUTANEOUS | @ 20:00:00

## 2022-10-13 MED ADMIN — insulin lispro (HumaLOG) injection 0-6 Units: 0-6 [IU] | SUBCUTANEOUS | @ 09:00:00

## 2022-10-13 MED ADMIN — DULoxetine (CYMBALTA) DR capsule 60 mg: 60 mg | ORAL | @ 14:00:00

## 2022-10-13 MED ADMIN — heparin (porcine) 5,000 unit/mL injection 5,000 Units: 5000 [IU] | SUBCUTANEOUS | @ 11:00:00

## 2022-10-13 MED ADMIN — tacrolimus (PROGRAF) capsule 2 mg: 2 mg | ORAL | @ 14:00:00

## 2022-10-13 MED ADMIN — insulin lispro (HumaLOG) injection 0-6 Units: 0-6 [IU] | SUBCUTANEOUS | @ 03:00:00

## 2022-10-13 MED ADMIN — tacrolimus (PROGRAF) capsule 2 mg: 2 mg | ORAL | @ 02:00:00

## 2022-10-13 MED ADMIN — gabapentin (NEURONTIN) capsule 300 mg: 300 mg | ORAL | @ 14:00:00

## 2022-10-13 MED ADMIN — heparin (porcine) 5,000 unit/mL injection 5,000 Units: 5000 [IU] | SUBCUTANEOUS | @ 02:00:00

## 2022-10-13 MED ADMIN — everolimus (immunosuppressive) (ZORTRESS) tablet 1.5 mg: 1.5 mg | ORAL | @ 02:00:00

## 2022-10-13 MED ADMIN — insulin NPH (HumuLIN,NovoLIN) injection 12 Units: 12 [IU] | SUBCUTANEOUS | @ 14:00:00 | Stop: 2022-10-13

## 2022-10-13 MED ADMIN — insulin NPH (HumuLIN,NovoLIN) injection 10 Units: 10 [IU] | SUBCUTANEOUS | @ 02:00:00

## 2022-10-13 MED ADMIN — piperacillin-tazobactam (ZOSYN) 3.375 g in sodium chloride 0.9 % (NS) 100 mL IVPB-MBP: 3.375 g | INTRAVENOUS | @ 11:00:00 | Stop: 2022-10-13

## 2022-10-13 MED ADMIN — insulin lispro (HumaLOG) injection 0-6 Units: 0-6 [IU] | SUBCUTANEOUS | @ 20:00:00

## 2022-10-13 MED ADMIN — insulin lispro (HumaLOG) injection 0-6 Units: 0-6 [IU] | SUBCUTANEOUS | @ 15:00:00

## 2022-10-13 NOTE — Unmapped (Signed)
Endocrine Team Diabetes Follow Up Consult Note     Consult information:  Requesting Attending Physician : [name], MD  Service Requesting Consult : Surg Transplant [phone])  Primary Care Provider: [name], MD  Impression:  Matthew Key is a 62 y.o. male admitted for elevated liver enzymes. We have been consulted at the request of [name], MD to evaluate Matthew Key for hyperglycemia.     Medical Decision Making:  Diagnoses:  1.Type 2 Diabetes. Uncontrolled With severe hyperglycemia last 24 hours.  2. Nutrition: Complicating glycemic control. Increasing risk for both hypoglycemia and hyperglycemia.  3. Transplant. Complicating glycemic control and increasing risk for hyperglycemia.  4.  Elevated Transaminases. Complicating glycemic control and increasing risk for hyperglycemia and hypoglycemia  5. CKD. Complicating glycemic control and increasing risk for hypoglycemia.       Studies reviewed 10/13/22:  Labs: CBC, CMP, POCT-BG, and HbA1C  Interpretation: WBC WNL. Anemia. Creatinine elevated and GFR reduced, consistent with known CKD. LFTS remain elevated but trending down. Severe hyperglycemia over the last 24 hours. A1c 7.5% indicating decent glycemic control outpatient.   Notes reviewed: Primary team and nursing notes      Overall impression based on above reviews and history:  Patient with T2DM admitted for elevated liver enzymes s/p OLT. He is known to our service and was initially restarted on prior inpatient regimen. He then received 4 mg of dexamethasone during ERCP 3/1 and BG trended up significantly. Insulin adjusted multiple times yesterday and BG improved overnight. FPG 186 mg/dL. Will increase basal insulin by an additional 20% today. We will continue to monitor closely. Expect BG to improve over the next 12 hours as steroid effects wear off. Will monitor closely. If BG persistently <160 mg/dL, plan to reduce basal and nutritional insulin by 20-30% tonight.    Recommendations:  - NPH 12u q12h  - lispro 10u qAC  - lispro correction target 140, ISF 30 achs  - Hypoglycemia protocol.  - POCT-BG achs.  - Ensure patient is on glucose precautions if patient taking nutrition by mouth.     Discharge planning:  Given A1c of 7.5%, Matthew Key can resume his home regimen of tresiba 15 units at bedtime, novolog correction, and ozempic 1mg  weekly at discharge.     Thank you for this consult. Discussed plan with primary team. We will continue to follow and make recommendations and place orders as appropriate.    Please page with questions or concerns: Daivd Council, [place]: [phone]  Endocrinology Diabetes Care Team on call from 6AM - 3PM on weekdays then endocrine fellow on call: [phone] from 3PM - 6AM on weekdays and on weekends and holidays.   If APP cannot be reached, please page the endocrine fellow on call.      Subjective:  Interval History:  NAEO. Patient reports feeling well this AM. Denies abdominal pain or N/V. Tolerating a regular diet. Plan for possible dispo home tomorrow, 3/4.    Initial HPI:  Matthew Key is a 62 y.o. 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        He reports feeling fine today. Denies abdominal pain or N/V. Reports tolerating a regular diet. NPO today for possible ERCP.    Diabetes History:  Patient has a history of Type 2 diabetes diagnosed 2017.  Diabetes is  managed by: PCP.  Current home diabetes regimen: tresiba 15 units at bedtime, novolog correction and ozempic 1mg  qweek (last dose 2/20).    Current home blood glucose monitoring:  3-4 times a day.  Hypoglycemia awareness: yes.  Complications related to diabetes: peripheral neuropathy      Current Nutrition:  Active Orders   Diet    Nutrition Therapy Regular/House       ROS: As per HPI.     amlodipine  10 mg Oral Daily    DULoxetine  60 mg Oral Daily    everolimus (immunosuppressive)  2 mg Oral Q AM    And    everolimus (immunosuppressive)  1.5 mg Oral Q PM    gabapentin  300 mg Oral TID    heparin (porcine) for subcutaneous use  5,000 Units Subcutaneous Q8H SCH    insulin lispro  0-6 Units Subcutaneous 5XD insulin    insulin lispro  10 Units Subcutaneous 3xd Meals    insulin NPH  10 Units Subcutaneous Q12H SCH    melatonin  3 mg Oral QPM    piperacillin-tazobactam (ZOSYN) IV (intermittent)  3.375 g Intravenous Q6H    psyllium  1 packet Oral BID    tacrolimus  2 mg Oral BID    ursodiol  300 mg Oral TID       Current Outpatient Medications   Medication Instructions    acetaminophen (TYLENOL) 325-650 mg, Oral, Every 6 hours PRN    amlodipine (NORVASC) 10 mg, Oral, Daily (standard)    aspirin (ECOTRIN) 81 mg, Oral, Daily (standard)    blood sugar diagnostic (ACCU-CHEK GUIDE TEST STRIPS) Strp Use to check blood sugar as directed with insulin 3 times a day & for symptoms of high or low blood sugar    blood-glucose meter kit Use as instructed    blood-glucose meter Misc Use to test blood sugar as directed    DULoxetine (CYMBALTA) 60 mg, Oral, Daily (standard)    everolimus, immunosuppressive, (ZORTRESS) 0.5 mg tablet Take 4 tablets (2 mg total) by mouth every morning AND 3 tablets (1.5 mg total) every evening.    gabapentin (NEURONTIN) 300 mg, Oral, 3 times a day (standard)    glucagon spray 3 mg/actuation Spry Use 1 spray into the left nostril every fifteen (15) minutes as needed (low blood sugar).    hydrOXYzine (VISTARIL) 50 mg, Oral, Nightly PRN    insulin aspart (NOVOLOG FLEXPEN) 100 unit/mL (3 mL) injection pen Inject under the skin per sliding scale prior to meals. If premeal BG 151-200 take 2 additional units for correction, if 201-250 take 4 additional units, etc. Max dose 36 units per day.  Store in-use prefilled pens at room temperature <86??F and use within 28 days; do not refrigerate.    lancets (ACCU-CHEK SOFTCLIX LANCETS) Misc Use to check blood sugar as directed with insulin 3 times a day & for symptoms of high or low blood sugar.    loperamide (IMODIUM) 2 mg capsule Take 1 capsule by mouth three times a day, end after 12/15, then take 1 capsule daily as needed for loose stools    melatonin 3 mg, Oral, Every evening    methocarbamol (ROBAXIN) 500 mg, Oral, 3 times a day PRN    OZEMPIC 0.5 mg, Subcutaneous, Every 7 days    pantoprazole (PROTONIX) 40 mg, Oral, Daily PRN    pen needle, diabetic (BD ULTRA-FINE NANO PEN NEEDLE) 32 gauge x 5/32 (4 mm) Ndle Use as directed  1-2 times per day  psyllium (METAMUCIL) 3.4 gram packet 1 packet, Oral, 2 times a day (standard)    tacrolimus (PROGRAF) 2 mg, Oral, 2 times a day    TRESIBA FLEXTOUCH U-100 15 Units, Subcutaneous, Daily (standard), Adjust as instructed.    ursodiol (ACTIGALL) 300 mg, Oral, 3 times a day (standard)           Past Medical History:   Diagnosis Date    AKI (acute kidney injury) (CMS-HCC) 12/14/2020    Anxiety 10/22/2013    Arthritis     Cervical radiculopathy 12/03/2016    Chronic pain disorder     Lower back    Cirrhosis (CMS-HCC)     Dental abscess 10/2020    Duodenal ulcer 12/01/2017    GERD (gastroesophageal reflux disease)     History of transfusion     Hyperlipidemia 10/22/2013    Hypertension     under control with meds and weight loss    Hypertension associated with diabetes (CMS-HCC) 09/25/2021    Hypotension 01/29/2022    Liver disease     Liver transplant status (CMS-HCC) 02/06/2022    Sleep apnea, obstructive     Have machine    Stroke (CMS-HCC)     mild stroke    Type 2 diabetes mellitus with diabetic neuropathy, with long-term current use of insulin (CMS-HCC) 06/09/2014       Past Surgical History:   Procedure Laterality Date    CHG X-RAY FOR BILE DUCT ENDOSCOPY  08/01/2022    Procedure: ENDOSCOPIC CATHETERIZATION OF THE BILIARY DUCTAL SYSTEM, RADIOLOGICAL SUPERVISION AND INTERPRETATION;  Surgeon: Vonda Antigua, MD;  [facility]: [facility];  Service: Gastroenterology    KNEE SURGERY      PR CATH PLACE/CORON ANGIO, IMG SUPER/INTERP,R&L HRT CATH, L HRT VENTRIC N/A 12/19/2020    Procedure: CATH LEFT/RIGHT HEART CATHETERIZATION;  Surgeon: Rosana Hoes, MD;  Location: [facility] CATH;  Service: Cardiology    PR COLONOSCOPY FLX DX W/COLLJ SPEC WHEN PFRMD N/A 12/07/2021    Procedure: COLONOSCOPY, FLEXIBLE, PROXIMAL TO SPLENIC FLEXURE; DIAGNOSTIC, W/WO COLLECTION SPECIMEN BY BRUSH OR WASH;  Surgeon: Annie Paras, MD;  Location: [facility];  Service: Gastroenterology    PR COLONOSCOPY W/BIOPSY SINGLE/MULTIPLE N/A 07/15/2022    Procedure: COLONOSCOPY, FLEXIBLE, PROXIMAL TO SPLENIC FLEXURE; WITH BIOPSY, SINGLE OR MULTIPLE;  Surgeon: Leland Her, MD;  Location: [facility];  Service: Gastroenterology    PR ERCP BALLOON DILATE BILIARY/PANC DUCT/AMPULLA EA N/A 08/01/2022    Procedure: ERCP;WITH TRANS-ENDOSCOPIC BALLOON DILATION OF BILIARY/PANCREATIC DUCT(S) OR OF AMPULLA, INCLUDING SPHINCTERECTOMY, WHEN PERFOREMD,EACH DUCT (98119);  Surgeon: Vonda Antigua, MD;  Location: [facility];  Service: Gastroenterology    PR ERCP STENT PLACEMENT BILIARY/PANCREATIC DUCT N/A 08/01/2022    Procedure: ENDOSCOPIC RETROGRADE CHOLANGIOPANCREATOGRAPHY (ERCP); WITH PLACEMENT OF ENDOSCOPIC STENT INTO BILIARY OR PANCREATIC DUCT;  Surgeon: Vonda Antigua, MD;  Location: [facility];  Service: Gastroenterology    PR ERCP,SPHINCTEROTOMY N/A 08/01/2022    Procedure: ERCP; W/SPHINCTEROTOMY/PAPILLOTOMY;  Surgeon: Vonda Antigua, MD;  Location: [facility];  Service: Gastroenterology    PR TRANSPLANT LIVER,ALLOTRANSPLANT N/A 02/06/2022    Procedure: LIVER ALLOTRANSPLANTATION; ORTHOTOPIC, PARTIAL OR WHOLE, FROM CADAVER OR LIVING DONOR, ANY AGE;  Surgeon: Florene Glen, MD;  Location: MAIN OR [place];  Service: Transplant    PR TRANSPLANT,PREP DONOR LIVER/ARTERIAL N/A 02/06/2022    Procedure: BACKBNCH RECONSTRUCT OF CAD/LIVE DONOR LIVER GFT PRIOR TRANSPLANT; ARTERIAL ANASTAMOSIS, EA;  Surgeon: Florene Glen,  MD;  Location: MAIN OR [place];  Service: Transplant    PR UPPER GI ENDOSCOPY,BIOPSY N/A 10/12/2020    Procedure: UGI ENDOSCOPY; WITH BIOPSY, SINGLE OR MULTIPLE;  Surgeon: Marene Lenz, MD;  Location: [facility];  Service: Gastroenterology    PR UPPER GI ENDOSCOPY,DIAGNOSIS N/A 01/03/2022    Procedure: UGI ENDO, INCLUDE ESOPHAGUS, STOMACH, & DUODENUM &/OR JEJUNUM; DX W/WO COLLECTION SPECIMN, BY BRUSH OR WASH;  Surgeon: Marene Lenz, MD;  Location: [facility];  Service: Gastroenterology    PR UPPER GI ENDOSCOPY,LIGAT VARIX N/A 12/07/2021    Procedure: UGI ENDO; Everlene Balls LIG ESOPH &/OR GASTRIC VARICES;  Surgeon: Annie Paras, MD;  Location: [facility];  Service: Gastroenterology    ROOT CANAL      Front teeth       Family History   Problem Relation Age of Onset    Edema Mother     Alzheimer's disease Father     Aortic dissection Brother     Early death Brother     Aneurysm Brother        Social History     Tobacco Use    Smoking status: Never    Smokeless tobacco: Never   Vaping Use    Vaping status: Never Used   Substance Use Topics    Alcohol use: Not Currently    Drug use: Not Currently       OBJECTIVE:  BP 152/74  - Pulse 69  - Temp 36.8 ??C (98.2 ??F) (Oral)  - Resp 17  - Ht 172.7 cm (5' 8)  - Wt 81.2 kg (179 lb)  - SpO2 96%  - BMI 27.22 kg/m??   Wt Readings from Last 12 Encounters:   10/11/22 81.2 kg (179 lb)   08/16/22 86.5 kg (190 lb 11.2 oz)   07/29/22 86.5 kg (190 lb 11.2 oz)   07/18/22 86 kg (189 lb 9.6 oz)   05/06/22 87.5 kg (193 lb)   04/08/22 86.7 kg (191 lb 1.6 oz)   04/08/22 86.7 kg (191 lb 1.6 oz)   03/11/22 86 kg (189 lb 11.2 oz)   02/25/22 88.6 kg (195 lb 4.8 oz)   02/25/22 88.6 kg (195 lb 4.8 oz)   02/18/22 96.3 kg (212 lb 3.2 oz)   02/11/22 94.8 kg (209 lb) Physical Exam  Vitals and nursing note reviewed.   Constitutional:       General: He is not in acute distress.     Comments: Sitting up in bed   HENT:      Head: Normocephalic.   Eyes:      Conjunctiva/sclera: Conjunctivae normal.   Pulmonary:      Effort: Pulmonary effort is normal. No respiratory distress.   Skin:     General: Skin is warm and dry.   Neurological:      Mental Status: He is alert and oriented to person, place, and time.   Psychiatric:         Mood and Affect: Mood normal.         Behavior: Behavior normal.           BG/insulin reviewed per EMR.   Glucose, POC (mg/dL)   Date Value   09/81/1914 207 (H)   10/12/2022 200 (H)   10/12/2022 322 (H)   10/12/2022 421 (HH)   10/12/2022 303 (H)   10/12/2022 300 (H)   10/11/2022 393 (H)   10/11/2022 481 (HH)  Summary of labs:  Lab Results   Component Value Date    A1C 7.5 (H) 08/16/2022    A1C 6.5 (H) 05/06/2022    A1C 5.2 02/05/2022     Lab Results   Component Value Date    CREATININE 1.45 (H) 10/13/2022     Lab Results   Component Value Date    WBC 6.1 10/13/2022    HGB 12.3 (L) 10/13/2022    HCT 37.0 (L) 10/13/2022    PLT 136 (L) 10/13/2022       Lab Results   Component Value Date    NA 140 10/13/2022    K 4.2 10/13/2022    CL 105 10/13/2022    CO2 28.0 10/13/2022    BUN 16 10/13/2022    CREATININE 1.45 (H) 10/13/2022    GLU 215 (H) 10/13/2022    CALCIUM 8.8 10/13/2022    MG 2.0 10/13/2022    PHOS 4.5 10/13/2022       Lab Results   Component Value Date    BILITOT 0.2 (L) 10/13/2022    BILIDIR <0.10 10/13/2022    PROT 6.1 10/13/2022    ALBUMIN 3.0 (L) 10/13/2022    ALT 129 (H) 10/13/2022    AST 33 10/13/2022    ALKPHOS 277 (H) 10/13/2022    GGT 109 (H) 10/10/2022

## 2022-10-13 NOTE — Unmapped (Signed)
Transplant Surgery Progress Note    Service Date: 10/13/2022  Admit Date: 10/10/2022, Hospital Day: 4  Hospital Service: Surg Transplant [facility])  Attending: [name], MD    Assessment     Matthew Key is a 62 y.o. 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 Now s/p ERCP 3/1 with obstructed stent removal, stricture resolved.        Interval Events   NAEO, afebrile. Hyperglycemia improved. Tolerating diet.     Plan     Neuro: reports no pain   -gabapentin 300mg  TID  -duloxetine 60mg  qd  -atarax 50mg  qhs PRN     CV: HDS  - amlodipine 10mg  qd    Pulm: stable on room air  - NAI    F: [employer].  E: No active electrolyte abnormalities  N:Nutrition Therapy Regular/House.    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   -  NAI    Heme/ID:   - zosyn 3.375g q6h 3/1-3-3, transitioned to oral Augmentin, 5 days total abx following ERCP  - daily CBC    Endo:   - insulin NPH 5u q12h  - SSI lispro ACHS  - endocrinology consulted, appreciate recs     Prophylaxis:  - DVT: SCDs.   - Everolimus 2mg  qd and 1.5mg  qhs  - tacrlimus 2mg  BID  - daily tacrolimus, everolimus level    Disposition: Floor status.   -PT/OT not indicated  -Home health needs: none      Objective     Vitals:   Temp:  [36.7 ??C (98.1 ??F)-36.8 ??C (98.2 ??F)] 36.8 ??C (98.2 ??F)  Heart Rate:  [66-80] 66  Resp:  [16-18] 16  BP: (131-158)/(68-77) 151/72  MAP (mmHg):  [87-101] 98  SpO2:  [93 %-98 %] 98 %    Physical Exam:  -General:  Appropriate, comfortable and in no apparent distress.   -Neurological: Moves all 4 extremities spontaneously.   -Cardiovascular: HDS  -Pulmonary: Normal work of breathing on RA  -Abdomen: Soft, non-tender, non-distended. No rebound or guarding. Well healed mercedes incision  -Extremities: Warm, well perfused.     Recent Imaging:  ERCP    Result Date: 10/11/2022  _______________________________________________________________________________ Patient Name: Matthew Key           Procedure Date: 10/11/2022 4:31 PM MRN: [id number]                     Date of Birth: Oct 31, 1960 Admit Type: Inpatient                 Age: 67 Room: [facility] 01 [facility]          Gender: Male Note Status: Finalized                Instrument Name: [id number] _______________________________________________________________________________  Procedure:             ERCP Indications:           Suspected bile duct stone(s), Elevated liver enzymes,                        Post liver transplant assessment Providers:  ANDREW Clementeen Hoof, MD, Kandice Robinsons, RN,                        [facility] torres Referring MD:          [name], MD (Referring MD) Medicines:             General Anesthesia, (the patient had recently received                        zosyn) Complications:         No immediate complications. Estimated blood loss:                        Minimal. _______________________________________________________________________________ Procedure:             Pre-Anesthesia Assessment:                        - Prior to the procedure, a History and Physical was                        performed, and patient medications and allergies were                        reviewed. The patient's tolerance of previous                        anesthesia was also reviewed. The risks and benefits                        of the procedure and the sedation options and risks                        were discussed with the patient. All questions were                        answered, and informed consent was obtained. Prior                        Anticoagulants: The patient has taken no anticoagulant                        or antiplatelet agents. ASA Grade Assessment: IV - A                        patient with severe systemic disease that is a                        constant threat to life. After reviewing the risks and                        benefits, the patient was deemed in satisfactory                        condition to undergo the procedure.                        After obtaining informed consent, the scope was passed  under direct vision. Throughout the procedure, the                        patient's blood pressure, pulse, and oxygen                        saturations were monitored continuously. The                        Duodenoscope was introduced through the mouth, and                        advanced to the duodenum and used to inject contrast                        into the bile duct. The ERCP was accomplished without                        difficulty. The patient tolerated the procedure well.                                                                                Findings:      No biopsies or other specimens were collected for this exam. A scout      film of the abdomen was obtained. Three stents ending in the main bile      duct were seen. The esophagus was successfully intubated under direct      vision without detailed examination of the pharynx, larynx, and      associated structures, and upper GI tract. The upper GI tract was      grossly normal. Three covered metal plastic biliary stents originating      in the biliary tree were emerging from the major papilla. The stents      were visibly occluded. A biliary sphincterotomy had been performed. The      sphincterotomy appeared open. The bile duct was deeply cannulated with      the 11.5 mm balloon. Contrast was injected. I personally interpreted the      bile duct images. There was brisk flow of contrast through the ducts.      Image quality was excellent. Contrast extended to the entire biliary      tree. The main bile duct contained filling defect(s) thought to be a      stone and sludge. The upper third of the main bile duct was markedly      dilated and diffusely dilated, acquired. The largest diameter was 17 mm.      A 0.025 inch x 270 cm angled Visiglide wire was passed into the biliary      tree. The biliary tree was swept with an 11.5 mm balloon starting at the      middle third of the main bile duct. Sludge was swept from the duct. Many      stones were removed. Many stones remained. Attempts were made to sweep      the stents alone rather than remove given the recency in placement and  stricture for which they were placed. However, these sweeps led to      migration that would not be optimal to leave the patient with, so the      stents were removed. Three stents were removed from the biliary tree      using a Raptor grasping device. The biliary tree was swept with an 11.5      mm balloon and 15 mm balloon starting at the left main hepatic duct and      right main hepatic duct. Sludge was swept from the duct. Many stones      were removed. No stones remained. A repeat cholangiogram was obtained      and this demonstrated no significant residual anastomotic stenosis.      There was faint resistance on sweeping the 15mm balloon through this      area likely related to the size of the balloon and not of clinical      significance. The decision was thus to not replace any stents.                                                                                Estimated Blood Loss:      Estimated blood loss was minimal. Impression:            - No specimens collected.                        - Three visibly occluded stents from the biliary tree                        were seen in the major papilla. Removed.                        - Prior biliary sphincterotomy appeared open.                        - Choledocholithiasis was found. Complete removal was                        accomplished by balloon extraction.                        - The anastomotic stricture had resolved and so stents                        were not replaced. Recommendation:        - Return patient to hospital ward for ongoing care.                        - Advance diet as tolerated.                        - Continue present medications, including antibiotics.                        Ok to rapidly de-escalate if clinical condition  improves, from a biliary perspective.                        - Observe patient's clinical course following today's                        ERCP with therapeutic intervention.                        - Return to referring physician.                                                                                Procedure Code(s):     --- Professional ---                        [phone], Endoscopic retrograde cholangiopancreatography                        (ERCP); with removal of foreign body(s) or stent(s)                        from biliary/pancreatic duct(s)                        43264, Endoscopic retrograde cholangiopancreatography                        (ERCP); with removal of calculi/debris from                        biliary/pancreatic duct(s)                        95284, Endoscopic catheterization of the biliary                        ductal system, radiological supervision and                        interpretation Diagnosis Code(s):     --- Professional ---                        X32.440N, Other mechanical complication of bile duct                        prosthesis, initial encounter                        K80.50, Calculus of bile duct without cholangitis or                        cholecystitis without obstruction                        R74.8, Abnormal levels of other serum enzymes                        Z09,  Encounter for follow-up examination after                        completed treatment for conditions other than                        malignant neoplasm                        Z94.4, Liver transplant status     CPT copyright 2022 [employer]. All rights reserved.     The codes documented in this report are preliminary and upon coder review may be revised to meet current compliance requirements. Attending Participation:      I personally performed the entire procedure.                                                                                    Electronically Signed By Cline Crock, MD _______________________ Kela Millin, MD 10/11/2022 6:01:14 PM The attending physician was present throughout the entire procedure including the insertion, viewing, and removal of the endoscope. This procedure note has been electronically signed by: Cline Crock , MD Number of Addenda: 0     Note Initiated On: 10/11/2022 4:31 PM    [place] [facility] (No Interpretation)    Result Date: 10/11/2022  Interpretation will follow in operative report.

## 2022-10-14 DIAGNOSIS — K862 Cyst of pancreas: Principal | ICD-10-CM

## 2022-10-14 DIAGNOSIS — K769 Liver disease, unspecified: Principal | ICD-10-CM

## 2022-10-14 DIAGNOSIS — E612 Magnesium deficiency: Principal | ICD-10-CM

## 2022-10-14 DIAGNOSIS — Z944 Liver transplant status: Principal | ICD-10-CM

## 2022-10-14 DIAGNOSIS — Z5181 Encounter for therapeutic drug level monitoring: Principal | ICD-10-CM

## 2022-10-14 LAB — PHOSPHORUS: PHOSPHORUS: 4.2 mg/dL (ref 2.4–5.1)

## 2022-10-14 LAB — CBC
HEMATOCRIT: 37.7 % — ABNORMAL LOW (ref 39.0–48.0)
HEMOGLOBIN: 12.2 g/dL — ABNORMAL LOW (ref 12.9–16.5)
MEAN CORPUSCULAR HEMOGLOBIN CONC: 32.4 g/dL (ref 32.0–36.0)
MEAN CORPUSCULAR HEMOGLOBIN: 25.4 pg — ABNORMAL LOW (ref 25.9–32.4)
MEAN CORPUSCULAR VOLUME: 78.4 fL (ref 77.6–95.7)
MEAN PLATELET VOLUME: 9.5 fL (ref 6.8–10.7)
PLATELET COUNT: 139 10*9/L — ABNORMAL LOW (ref 150–450)
RED BLOOD CELL COUNT: 4.82 10*12/L (ref 4.26–5.60)
RED CELL DISTRIBUTION WIDTH: 15.4 % — ABNORMAL HIGH (ref 12.2–15.2)
WBC ADJUSTED: 5.1 10*9/L (ref 3.6–11.2)

## 2022-10-14 LAB — HEPATIC FUNCTION PANEL
ALBUMIN: 2.9 g/dL — ABNORMAL LOW (ref 3.4–5.0)
ALKALINE PHOSPHATASE: 249 U/L — ABNORMAL HIGH (ref 46–116)
ALT (SGPT): 107 U/L — ABNORMAL HIGH (ref 10–49)
AST (SGOT): 35 U/L — ABNORMAL HIGH (ref <=34)
BILIRUBIN DIRECT: <0.1 mg/dL (ref 0.00–0.30)
BILIRUBIN TOTAL: 0.2 mg/dL — ABNORMAL LOW (ref 0.3–1.2)
PROTEIN TOTAL: 6.1 g/dL (ref 5.7–8.2)

## 2022-10-14 LAB — BASIC METABOLIC PANEL
ANION GAP: 6 mmol/L (ref 5–14)
BLOOD UREA NITROGEN: 12 mg/dL (ref 9–23)
BUN / CREAT RATIO: 10
CALCIUM: 9.1 mg/dL (ref 8.7–10.4)
CHLORIDE: 106 mmol/L (ref 98–107)
CO2: 31 mmol/L (ref 20.0–31.0)
CREATININE: 1.22 mg/dL — ABNORMAL HIGH
EGFR CKD-EPI (2021) MALE: 67 mL/min/{1.73_m2} (ref >=60)
GLUCOSE RANDOM: 177 mg/dL (ref 70–179)
POTASSIUM: 4.8 mmol/L (ref 3.4–4.8)
SODIUM: 143 mmol/L (ref 135–145)

## 2022-10-14 LAB — TACROLIMUS LEVEL, TIMED: TACROLIMUS BLOOD: 3.4 ng/mL

## 2022-10-14 LAB — EVEROLIMUS: EVEROLIMUS LEVEL: 4.2 ng/mL (ref 3.0–15.0)

## 2022-10-14 LAB — MAGNESIUM: MAGNESIUM: 1.7 mg/dL (ref 1.6–2.6)

## 2022-10-14 MED ORDER — AMOXICILLIN 875 MG-POTASSIUM CLAVULANATE 125 MG TABLET
ORAL_TABLET | Freq: Two times a day (BID) | ORAL | 0 refills | 3 days | Status: CP
Start: 2022-10-14 — End: 2022-10-17
  Filled 2022-10-14: qty 5, 3d supply, fill #0

## 2022-10-14 MED ADMIN — tacrolimus (PROGRAF) capsule 2 mg: 2 mg | ORAL | @ 02:00:00

## 2022-10-14 MED ADMIN — heparin (porcine) 5,000 unit/mL injection 5,000 Units: 5000 [IU] | SUBCUTANEOUS | @ 12:00:00 | Stop: 2022-10-14

## 2022-10-14 MED ADMIN — ursodiol (ACTIGALL) capsule 300 mg: 300 mg | ORAL | @ 14:00:00 | Stop: 2022-10-14

## 2022-10-14 MED ADMIN — amoxicillin-clavulanate (AUGMENTIN) 875-125 mg per tablet 1 tablet: 875 mg | ORAL | @ 02:00:00 | Stop: 2022-10-16

## 2022-10-14 MED ADMIN — insulin lispro (HumaLOG) injection 5 Units: 5 [IU] | SUBCUTANEOUS | @ 14:00:00 | Stop: 2022-10-14

## 2022-10-14 MED ADMIN — insulin NPH (HumuLIN,NovoLIN) injection 10 Units: 10 [IU] | SUBCUTANEOUS | @ 05:00:00 | Stop: 2022-10-14

## 2022-10-14 MED ADMIN — insulin NPH (HumuLIN,NovoLIN) injection 8 Units: 8 [IU] | SUBCUTANEOUS | @ 14:00:00 | Stop: 2022-10-14

## 2022-10-14 MED ADMIN — amlodipine (NORVASC) tablet 10 mg: 10 mg | ORAL | @ 14:00:00 | Stop: 2022-10-14

## 2022-10-14 MED ADMIN — gabapentin (NEURONTIN) capsule 300 mg: 300 mg | ORAL | @ 14:00:00 | Stop: 2022-10-14

## 2022-10-14 MED ADMIN — insulin lispro (HumaLOG) injection 0-6 Units: 0-6 [IU] | SUBCUTANEOUS | @ 14:00:00 | Stop: 2022-10-14

## 2022-10-14 MED ADMIN — amoxicillin-clavulanate (AUGMENTIN) 875-125 mg per tablet 1 tablet: 875 mg | ORAL | @ 14:00:00 | Stop: 2022-10-14

## 2022-10-14 MED ADMIN — tacrolimus (PROGRAF) capsule 2 mg: 2 mg | ORAL | @ 14:00:00 | Stop: 2022-10-14

## 2022-10-14 MED ADMIN — ursodiol (ACTIGALL) capsule 300 mg: 300 mg | ORAL | @ 02:00:00

## 2022-10-14 MED ADMIN — melatonin tablet 3 mg: 3 mg | ORAL | @ 02:00:00

## 2022-10-14 MED ADMIN — heparin (porcine) 5,000 unit/mL injection 5,000 Units: 5000 [IU] | SUBCUTANEOUS | @ 04:00:00

## 2022-10-14 MED ADMIN — everolimus (immunosuppressive) (ZORTRESS) tablet 2 mg: 2 mg | ORAL | @ 14:00:00 | Stop: 2022-10-14

## 2022-10-14 MED ADMIN — oxyCODONE (ROXICODONE) immediate release tablet 5 mg: 5 mg | ORAL | @ 04:00:00 | Stop: 2022-10-13

## 2022-10-14 MED ADMIN — everolimus (immunosuppressive) (ZORTRESS) tablet 1.5 mg: 1.5 mg | ORAL | @ 02:00:00

## 2022-10-14 MED ADMIN — DULoxetine (CYMBALTA) DR capsule 60 mg: 60 mg | ORAL | @ 14:00:00 | Stop: 2022-10-14

## 2022-10-14 MED ADMIN — gabapentin (NEURONTIN) capsule 300 mg: 300 mg | ORAL | @ 02:00:00

## 2022-10-14 MED ADMIN — insulin lispro (HumaLOG) injection 0-6 Units: 0-6 [IU] | SUBCUTANEOUS | @ 05:00:00 | Stop: 2022-10-14

## 2022-10-14 NOTE — Unmapped (Signed)
Pt A&Ox4. C/o back pain, given one time dose of 5 mg oxycodone, relieved pain. BG monitored. Pt rested between care.      Problem: Adult Inpatient Plan of Care  Goal: Plan of Care Review  Outcome: Progressing  Goal: Patient-Specific Goal (Individualized)  Outcome: Progressing  Goal: Absence of Hospital-Acquired Illness or Injury  Outcome: Progressing  Intervention: Identify and Manage Fall Risk  Recent Flowsheet Documentation  Taken 10/13/2022 2002 by Cecille Rubin, RN  Safety Interventions:   fall reduction program maintained   lighting adjusted for tasks/safety   low bed  Intervention: Prevent Skin Injury  Recent Flowsheet Documentation  Taken 10/13/2022 2100 by Cecille Rubin, RN  Device Skin Pressure Protection: absorbent pad utilized/changed  Taken 10/13/2022 2002 by Cecille Rubin, RN  Positioning for Skin: Supine/Back  Device Skin Pressure Protection: absorbent pad utilized/changed  Skin Protection: adhesive use limited  Intervention: Prevent Infection  Recent Flowsheet Documentation  Taken 10/13/2022 2002 by Cecille Rubin, RN  Infection Prevention:   hand hygiene promoted   rest/sleep promoted  Goal: Optimal Comfort and Wellbeing  Outcome: Progressing  Goal: Readiness for Transition of Care  Outcome: Progressing  Goal: Rounds/Family Conference  Outcome: Progressing     Problem: Fall Injury Risk  Goal: Absence of Fall and Fall-Related Injury  Outcome: Progressing  Intervention: Promote Injury-Free Environment  Recent Flowsheet Documentation  Taken 10/13/2022 2002 by Cecille Rubin, RN  Safety Interventions:   fall reduction program maintained   lighting adjusted for tasks/safety   low bed

## 2022-10-14 NOTE — Unmapped (Signed)
Endocrine Team Diabetes Follow Up Consult Note     Consult information:  Requesting Attending Physician : [name], MD  Service Requesting Consult : Surg Transplant [phone])  Primary Care Provider: [name], MD  Impression:  Matthew Key is a 62 y.o. male admitted for elevated liver enzymes. We have been consulted at the request of [name], MD to evaluate Raekwon for hyperglycemia.     Medical Decision Making:  Diagnoses:  1.Type 2 Diabetes. Uncontrolled With hyperglycemia.  2. Nutrition: Complicating glycemic control. Increasing risk for both hypoglycemia and hyperglycemia.  3. Transplant. Complicating glycemic control and increasing risk for hyperglycemia.  4.  Elevated Transaminases. Complicating glycemic control and increasing risk for hyperglycemia and hypoglycemia  5. CKD. Complicating glycemic control and increasing risk for hypoglycemia.       Studies reviewed 10/14/22:  Labs: CBC, CMP, POCT-BG, and HbA1C  Interpretation: WBC WNL. Anemia. Creatinine elevated and GFR reduced, consistent with known CKD. LFTS remain elevated but trending down. Intermittent hyperglycemia over the last 24 hours. A1c 7.5% indicating decent glycemic control outpatient.   Notes reviewed: Primary team and nursing notes      Overall impression based on above reviews and history:  Patient with T2DM admitted for elevated liver enzymes s/p OLT. He was severely hyperglycemic this admission after receiving 4 mg of dexamethasone during ERCP 3/1. Blood sugars now improved. As steroid effects should be wearing off, will reduce basal insulin by an additional 20% this AM and cut nutritional insulin in half. We will continue to monitor closely.    Recommendations:  - NPH 8u q12h  - lispro 5u qAC  - lispro correction target 140, ISF 30 5x  - Hypoglycemia protocol.  - POCT-BG 5 times a day.  - Ensure patient is on glucose precautions if patient taking nutrition by mouth.     Discharge planning:  Given A1c of 7.5%, Mr. Vold can resume his home regimen of tresiba 15 units at bedtime, novolog correction, and ozempic 1mg  weekly at discharge. Follow up with transplant team and PCP.    Thank you for this consult. Discussed plan with primary team. We will continue to follow and make recommendations and place orders as appropriate.    Please page with questions or concerns: Daivd Council, [place]: [phone]  Endocrinology Diabetes Care Team on call from 6AM - 3PM on weekdays then endocrine fellow on call: [phone] from 3PM - 6AM on weekdays and on weekends and holidays.   If APP cannot be reached, please page the endocrine fellow on call.      Subjective:  Interval History:  NAEO. Patient reports feeling fine this AM. Denies abdominal pain or N/V. Tolerating a regular diet. Plan for dispo home today.    Initial HPI:  Matthew Key is a 62 y.o. 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        He reports feeling fine today. Denies abdominal pain or N/V. Reports tolerating a regular diet. NPO today for possible ERCP.    Diabetes History:  Patient has a history of Type 2 diabetes diagnosed 2017.  Diabetes is managed by: PCP.  Current home diabetes regimen: tresiba 15 units at bedtime, novolog correction and ozempic 1mg  qweek (last dose 2/20).    Current home blood glucose monitoring:  3-4 times a day.  Hypoglycemia  awareness: yes.  Complications related to diabetes: peripheral neuropathy      Current Nutrition:  Active Orders   Diet    Nutrition Therapy Regular/House       ROS: As per HPI.     amlodipine  10 mg Oral Daily    amoxicillin-clavulanate  875 mg Oral Q12H SCH    DULoxetine  60 mg Oral Daily    everolimus (immunosuppressive)  2 mg Oral Q AM    And    everolimus (immunosuppressive)  1.5 mg Oral Q PM    gabapentin  300 mg Oral TID    heparin (porcine) for subcutaneous use  5,000 Units Subcutaneous Q8H SCH    insulin lispro  0-6 Units Subcutaneous 5XD insulin    insulin lispro  5 Units Subcutaneous 3xd Meals    insulin NPH  8 Units Subcutaneous Q12H SCH    melatonin  3 mg Oral QPM    psyllium  1 packet Oral BID    tacrolimus  2 mg Oral BID    ursodiol  300 mg Oral TID       Current Outpatient Medications   Medication Instructions    acetaminophen (TYLENOL) 325-650 mg, Oral, Every 6 hours PRN    amlodipine (NORVASC) 10 mg, Oral, Daily (standard)    aspirin (ECOTRIN) 81 mg, Oral, Daily (standard)    blood sugar diagnostic (ACCU-CHEK GUIDE TEST STRIPS) Strp Use to check blood sugar as directed with insulin 3 times a day & for symptoms of high or low blood sugar    blood-glucose meter kit Use as instructed    blood-glucose meter Misc Use to test blood sugar as directed    DULoxetine (CYMBALTA) 60 mg, Oral, Daily (standard)    everolimus, immunosuppressive, (ZORTRESS) 0.5 mg tablet Take 4 tablets (2 mg total) by mouth every morning AND 3 tablets (1.5 mg total) every evening.    gabapentin (NEURONTIN) 300 mg, Oral, 3 times a day (standard)    glucagon spray 3 mg/actuation Spry Use 1 spray into the left nostril every fifteen (15) minutes as needed (low blood sugar).    hydrOXYzine (VISTARIL) 50 mg, Oral, Nightly PRN    insulin aspart (NOVOLOG FLEXPEN) 100 unit/mL (3 mL) injection pen Inject under the skin per sliding scale prior to meals. If premeal BG 151-200 take 2 additional units for correction, if 201-250 take 4 additional units, etc. Max dose 36 units per day.  Store in-use prefilled pens at room temperature <86??F and use within 28 days; do not refrigerate.    lancets (ACCU-CHEK SOFTCLIX LANCETS) Misc Use to check blood sugar as directed with insulin 3 times a day & for symptoms of high or low blood sugar.    loperamide (IMODIUM) 2 mg capsule Take 1 capsule by mouth three times a day, end after 12/15, then take 1 capsule daily as needed for loose stools    melatonin 3 mg, Oral, Every evening    methocarbamol (ROBAXIN) 500 mg, Oral, 3 times a day PRN    OZEMPIC 0.5 mg, Subcutaneous, Every 7 days    pantoprazole (PROTONIX) 40 mg, Oral, Daily PRN    pen needle, diabetic (BD ULTRA-FINE NANO PEN NEEDLE) 32 gauge x 5/32 (4 mm) Ndle Use as directed  1-2 times per day    psyllium (METAMUCIL) 3.4 gram packet 1 packet, Oral, 2 times a day (standard)    tacrolimus (PROGRAF) 2 mg, Oral, 2 times a day    TRESIBA FLEXTOUCH U-100 15 Units, Subcutaneous, Daily (standard), Adjust  as instructed.    ursodiol (ACTIGALL) 300 mg, Oral, 3 times a day (standard)           Past Medical History:   Diagnosis Date    AKI (acute kidney injury) (CMS-HCC) 12/14/2020    Anxiety 10/22/2013    Arthritis     Cervical radiculopathy 12/03/2016    Chronic pain disorder     Lower back    Cirrhosis (CMS-HCC)     Dental abscess 10/2020    Duodenal ulcer 12/01/2017    GERD (gastroesophageal reflux disease)     History of transfusion     Hyperlipidemia 10/22/2013    Hypertension     under control with meds and weight loss    Hypertension associated with diabetes (CMS-HCC) 09/25/2021    Hypotension 01/29/2022    Liver disease     Liver transplant status (CMS-HCC) 02/06/2022    Sleep apnea, obstructive     Have machine    Stroke (CMS-HCC)     mild stroke    Type 2 diabetes mellitus with diabetic neuropathy, with long-term current use of insulin (CMS-HCC) 06/09/2014       Past Surgical History:   Procedure Laterality Date    CHG X-RAY FOR BILE DUCT ENDOSCOPY  08/01/2022    Procedure: ENDOSCOPIC CATHETERIZATION OF THE BILIARY DUCTAL SYSTEM, RADIOLOGICAL SUPERVISION AND INTERPRETATION;  Surgeon: Vonda Antigua, MD;  [facility]: [facility];  Service: Gastroenterology    KNEE SURGERY      PR CATH PLACE/CORON ANGIO, IMG SUPER/INTERP,R&L HRT CATH, L HRT VENTRIC N/A 12/19/2020    Procedure: CATH LEFT/RIGHT HEART CATHETERIZATION;  Surgeon: Rosana Hoes, MD; Location: [facility] CATH;  Service: Cardiology    PR COLONOSCOPY FLX DX W/COLLJ SPEC WHEN PFRMD N/A 12/07/2021    Procedure: COLONOSCOPY, FLEXIBLE, PROXIMAL TO SPLENIC FLEXURE; DIAGNOSTIC, W/WO COLLECTION SPECIMEN BY BRUSH OR WASH;  Surgeon: Annie Paras, MD;  Location: GI PROCEDURES [facility];  Service: Gastroenterology    PR COLONOSCOPY W/BIOPSY SINGLE/MULTIPLE N/A 07/15/2022    Procedure: COLONOSCOPY, FLEXIBLE, PROXIMAL TO SPLENIC FLEXURE; WITH BIOPSY, SINGLE OR MULTIPLE;  Surgeon: Leland Her, MD;  Location: [facility]  Service: Gastroenterology    PR ERCP BALLOON DILATE BILIARY/PANC DUCT/AMPULLA EA N/A 08/01/2022    Procedure: ERCP;WITH TRANS-ENDOSCOPIC BALLOON DILATION OF BILIARY/PANCREATIC DUCT(S) OR OF AMPULLA, INCLUDING SPHINCTERECTOMY, WHEN PERFOREMD,EACH DUCT ([address]109);  Surgeon: Vonda Antigua, MD;  Location: GI [facility];  Service: Gastroenterology    PR ERCP STENT PLACEMENT BILIARY/PANCREATIC DUCT N/A 08/01/2022    Procedure: ENDOSCOPIC RETROGRADE CHOLANGIOPANCREATOGRAPHY (ERCP); WITH PLACEMENT OF ENDOSCOPIC STENT INTO BILIARY OR PANCREATIC DUCT;  Surgeon: Vonda Antigua, MD;  Location: GI [facility];  Service: Gastroenterology    PR ERCP,SPHINCTEROTOMY N/A 08/01/2022    Procedure: ERCP; W/SPHINCTEROTOMY/PAPILLOTOMY;  Surgeon: Vonda Antigua, MD;  Location: GI [facility];  Service: Gastroenterology    PR TRANSPLANT LIVER,ALLOTRANSPLANT N/A 02/06/2022    Procedure: LIVER ALLOTRANSPLANTATION; ORTHOTOPIC, PARTIAL OR WHOLE, FROM CADAVER OR LIVING DONOR, ANY AGE;  Surgeon: Florene Glen, MD;  Location: MAIN OR [place];  Service: Transplant    PR TRANSPLANT,PREP DONOR LIVER/ARTERIAL N/A 02/06/2022    Procedure: BACKBNCH RECONSTRUCT OF CAD/LIVE DONOR LIVER GFT PRIOR TRANSPLANT; ARTERIAL ANASTAMOSIS, EA;  Surgeon: Florene Glen, MD;  Location: MAIN OR [place];  Service: Transplant    PR UPPER GI ENDOSCOPY,BIOPSY N/A 10/12/2020    Procedure: UGI ENDOSCOPY; WITH BIOPSY, SINGLE OR MULTIPLE;  Surgeon: Marene Lenz, MD;  Location: GI  [facility];  Service: Gastroenterology    PR UPPER GI ENDOSCOPY,DIAGNOSIS N/A 01/03/2022    Procedure: UGI ENDO, INCLUDE ESOPHAGUS, STOMACH, & DUODENUM &/OR JEJUNUM; DX W/WO COLLECTION SPECIMN, BY BRUSH OR WASH;  Surgeon: Marene Lenz, MD;  Location: [facility];  Service: Gastroenterology    PR UPPER GI ENDOSCOPY,LIGAT VARIX N/A 12/07/2021    Procedure: UGI ENDO; Everlene Balls LIG ESOPH &/OR GASTRIC VARICES;  Surgeon: Annie Paras, MD;  Location: GI [facility];  Service: Gastroenterology    ROOT CANAL      Front teeth       Family History   Problem Relation Age of Onset    Edema Mother     Alzheimer's disease Father     Aortic dissection Brother     Early death Brother     Aneurysm Brother        Social History     Tobacco Use    Smoking status: Never    Smokeless tobacco: Never   Vaping Use    Vaping status: Never Used   Substance Use Topics    Alcohol use: Not Currently    Drug use: Not Currently       OBJECTIVE:  BP 144/77  - Pulse 69  - Temp 36.6 ??C (97.9 ??F) (Oral)  - Resp 16  - Ht 172.7 cm (5' 8)  - Wt 81.2 kg (179 lb)  - SpO2 98%  - BMI 27.22 kg/m??   Wt Readings from Last 12 Encounters:   10/11/22 81.2 kg (179 lb)   08/16/22 86.5 kg (190 lb 11.2 oz)   07/29/22 86.5 kg (190 lb 11.2 oz)   07/18/22 86 kg (189 lb 9.6 oz)   05/06/22 87.5 kg (193 lb)   04/08/22 86.7 kg (191 lb 1.6 oz)   04/08/22 86.7 kg (191 lb 1.6 oz)   03/11/22 86 kg (189 lb 11.2 oz)   02/25/22 88.6 kg (195 lb 4.8 oz)   02/25/22 88.6 kg (195 lb 4.8 oz)   02/18/22 96.3 kg (212 lb 3.2 oz)   02/11/22 94.8 kg (209 lb)     Physical Exam  Vitals and nursing note reviewed.   Constitutional:       General: He is not in acute distress.     Comments: Lying in bed   HENT:      Head: Normocephalic.   Eyes:      Conjunctiva/sclera: Conjunctivae normal.   Pulmonary:      Effort: Pulmonary effort is normal. No respiratory distress.   Skin:     General: Skin is warm and dry.   Neurological:      Mental Status: He is alert and oriented to person, place, and time.   Psychiatric:         Mood and Affect: Mood normal.         Behavior: Behavior normal.           BG/insulin reviewed per EMR.   Glucose, POC (mg/dL)   Date Value   16/05/9603 150   10/14/2022 239 (H)   10/13/2022 130   10/13/2022 147   10/13/2022 215 (H)   10/13/2022 186 (H)   10/13/2022 207 (H)   10/12/2022 200 (H)        Summary of labs:  Lab Results   Component Value Date    A1C 7.5 (H) 08/16/2022    A1C 6.5 (H) 05/06/2022    A1C 5.2 02/05/2022     Lab Results  Component Value Date    CREATININE 1.22 (H) 10/14/2022     Lab Results   Component Value Date    WBC 5.1 10/14/2022    HGB 12.2 (L) 10/14/2022    HCT 37.7 (L) 10/14/2022    PLT 139 (L) 10/14/2022       Lab Results   Component Value Date    NA 143 10/14/2022    K 4.8 10/14/2022    CL 106 10/14/2022    CO2 31.0 10/14/2022    BUN 12 10/14/2022    CREATININE 1.22 (H) 10/14/2022    GLU 177 10/14/2022    CALCIUM 9.1 10/14/2022    MG 1.7 10/14/2022    PHOS 4.2 10/14/2022       Lab Results   Component Value Date    BILITOT 0.2 (L) 10/14/2022    BILIDIR <0.10 10/14/2022    PROT 6.1 10/14/2022    ALBUMIN 2.9 (L) 10/14/2022    ALT 107 (H) 10/14/2022    AST 35 (H) 10/14/2022    ALKPHOS 249 (H) 10/14/2022    GGT 109 (H) 10/10/2022

## 2022-10-14 NOTE — Unmapped (Cosign Needed)
Discharge Summary    Admit date: 10/10/2022    Discharge date and time: 10/14/22    Discharge to:  Home    Discharge Service: Surg Transplant [facility])    Discharge Attending Physician: [name], MD    Discharge  Diagnoses: transaminitis of liver transplant (resolved)    Secondary Diagnosis: Active Problems:    Liver transplant status (CMS-HCC) (POA: Not Applicable)    Type 2 diabetes mellitus without complication (CMS-HCC) (POA: Yes)    Transaminitis of transplant liver (POA: Yes)  Resolved Problems:    * No resolved hospital problems. *      OR Procedures:    ENDOSCOPIC RETROGRADE CHOLANGIOPANCREATOGRAPHY (ERCP); W/ REMOVAL OF FOREIGN BODY/STENT FROM BILIARY/PANCREATIC DUCT(S)  ERCP; W/ENDOSCOPIC RETROGRADE REMOVAL OF CALCULUS/CALCULI FROM BILIARY &/OR PANCREATIC DUCTS  Date  10/11/2022  -------------------     Ancillary Procedures: no procedures    Discharge Day Services: The patient was seen and examined by the surgical team on the day of discharge. Vital signs and laboratory values were stable and appropriate for discharge. Discharge plan was discussed with patient, instructions for home care given, and all questions answered. More than 30 minutes was spent in discharge planning services.    Subjective   No acute events overnight. Pain Controlled. No fever or chills.    Objective   Patient Vitals for the past 8 hrs:   BP Temp Temp src Pulse Resp SpO2   10/14/22 0832 154/77 36.5 ??C (97.7 ??F) Oral 71 17 97 %     I/O this shift:  In: -   Out: 350 [Urine:350]    General Appearance:   No acute distress  Lungs:                NWOB on RA  Heart:                           Regular rate, HDS  Abdomen:                Soft, non-tender, non-distended. Mercedes scar well healed  Extremities:              Warm and well perfused    Hospital Course:  Patient is a 62 y.o. 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 Celine Mans. Liver biopsy 12/5 wo evidence of rejection. Presents 2/29 for transaminitis.     Transplant LUS and MRI with dilated bile ducts. Patient underwent ERCP on 3/1 which revealed stent full of stens and sludge, stents removed and ducts swept, no stent replaced given stricture was resolved. Received peri-procedure zosyn, de-escalated to Augmentin for discharge. Patient was at baseline ambulatory status, tolerating diet, afebrile, and appropriate for discharge to home on 10/13/21.     Graft Function: DBD OLT on 6/28, c/b transaminitis 11/30, improving  -Liver biopsy 12/5 wo rejection    -ERCP 3/1 w stent removal, stricture gone, stones and sludge swept.    Immunosuppression:  -Induction: Basiliximab (Simulect) and Methylpred  -Tac at dc: 2mg  BID  -Everolimus at dc: 2mg  qAM &1.5mg  qPM   -Pred: none (completed taper)  Prophylaxis:  -PJP: Completed   -CMV: Completed treatment 05/09/22. CMV (D-/R+ or D+/R+): Moderate Risk.  Serum CMV DNA 11/30 not detected.  -ASA 81 at dc: continue      Condition at Discharge: Improved  Discharge Medications:      Medication List      START  taking these medications     amoxicillin-clavulanate 875-125 mg per tablet; Commonly known as:   AUGMENTIN; Take 1 tablet by mouth every twelve (12) hours for 5 doses.     CONTINUE taking these medications     * ACCU-CHEK GUIDE GLUCOSE METER Misc; Generic drug: blood-glucose meter;   Use to test blood sugar as directed   * ACCU-CHEK GUIDE GLUCOSE METER Misc; Generic drug: blood-glucose meter;   Use as instructed   ACCU-CHEK GUIDE TEST STRIPS Strp; Generic drug: blood sugar diagnostic;   Use to check blood sugar as directed with insulin 3 times a day & for   symptoms of high or low blood sugar   ACCU-CHEK SOFTCLIX LANCETS lancets; Generic drug: lancets; Use to check   blood sugar as directed with insulin 3 times a day & for symptoms of high   or low blood sugar.   acetaminophen 325 MG tablet; Commonly known as: TYLENOL; Take 1-2   tablets (325-650 mg total) by mouth every six (6) hours as needed for pain or fever.   amlodipine 10 MG tablet; Commonly known as: NORVASC; Take 1 tablet (10   mg total) by mouth daily.   aspirin 81 MG tablet; Commonly known as: ECOTRIN; Take 1 tablet (81 mg   total) by mouth daily.   BAQSIMI 3 mg/actuation Spry; Generic drug: glucagon spray; Use 1 spray   into the left nostril every fifteen (15) minutes as needed (low blood   sugar).   BD ULTRA-FINE NANO PEN NEEDLE 32 gauge x 5/32 (4 mm) Ndle; Generic   drug: pen needle, diabetic; Use as directed  1-2 times per day   DULoxetine 30 MG capsule; Commonly known as: CYMBALTA; Take 2 capsules   (60 mg total) by mouth daily.   everolimus (immunosuppressive) 0.5 mg tablet; Commonly known as:   ZORTRESS; Take 4 tablets (2 mg total) by mouth every morning AND 3 tablets   (1.5 mg total) every evening.   gabapentin 300 MG capsule; Commonly known as: NEURONTIN; Take 1 capsule   (300 mg total) by mouth Three (3) times a day.   hydrOXYzine 50 MG capsule; Commonly known as: VISTARIL; Take 1 capsule   (50 mg total) by mouth nightly as needed for anxiety.   loperamide 2 mg capsule; Commonly known as: IMODIUM; Take 1 capsule by   mouth three times a day, end after 12/15, then take 1 capsule daily as   needed for loose stools   melatonin 3 mg Tab; Take 1 tablet (3 mg total) by mouth every evening.   methocarbamol 500 MG tablet; Commonly known as: ROBAXIN; Take 1 tablet   (500 mg total) by mouth Three (3) times a day as needed.   NovoLOG Flexpen U-100 Insulin 100 unit/mL (3 mL) injection pen; Generic   drug: insulin aspart; Inject under the skin per sliding scale prior to   meals. If premeal BG 151-200 take 2 additional units for correction, if   201-250 take 4 additional units, etc. Max dose 36 units per day.  Store   in-use prefilled pens at room temperature <86?F and use within 28 days; do   not refrigerate.   OZEMPIC 0.25 mg or 0.5 mg (2 mg/3 mL) Pnij; Generic drug: semaglutide;   Inject 0.5 mg under the skin every seven (7) days.   pantoprazole 40 MG tablet; Commonly known as: Protonix; Take 1 tablet   (40 mg total) by mouth daily as needed.   psyllium 3.4 gram packet; Commonly known as:  METAMUCIL; Take 1 packet by   mouth two (2) times a day.   tacrolimus 1 MG capsule; Commonly known as: PROGRAF; Take 2 capsules (2   mg total) by mouth two (2) times a day.   TRESIBA FLEXTOUCH U-100 100 unit/mL (3 mL) Inpn; Generic drug: insulin   degludec; Inject 0.15 mL (15 Units total) under the skin daily. Adjust as   instructed.   ursodiol 300 mg capsule; Commonly known as: ACTIGALL; Take 1 capsule   (300 mg total) by mouth Three (3) times a day.  * This list has 2 medication(s) that are the same as other medications   prescribed for you. Read the directions carefully, and ask your doctor or   other care provider to review them with you.       Pending Test Results:     Discharge Instructions:  Activity:     Diet:    Other Instructions:  Other Instructions       Discharge instructions      Discharge Instructions:  Activity: resume    Diet: resume    Your Liver Post-Transplant Coordinator is Emilio Math (see contact info at bottom of instructions). Contact your transplant coordinator or the Transplant Surgery Office [phone]) during business hours or page the transplant coordinator on call [phone]) after business hours for:    - fever >100.5 degrees F by mouth, any fever with shaking chills, or other signs or symptoms of infection   - uncontrolled nausea, vomiting, or diarrhea; inability to have a bowel movement for > 3 days.   - any problem that prevents taking medications as scheduled.   - pain uncontrolled with prescribed medication or new pain or tenderness at the surgical site   - sudden weight gain or increase in blood pressure (greater than 140/85)   - shortness of breath, chest pain / discomfort   - new or increasing jaundice   - urinary symptoms including pain / difficulty / burning or tea-colored urine   - any other new or concerning symptoms   - questions regarding your medications or continuing care    Patient may shower.    Maintain a written record of medications taken and review against the discharge medications sheet. Periodically review your Transplant Handbook for important information regarding postoperative care and required precautions.      Labs and Other Follow-ups after Discharge:   Labs 3x week: CBC, CMP, GGT, Mg, Phos, and Tacrolimus trough level     Liver Post-Transplant Coordinator:  Emilio Math- phone: [phone] fax: [phone]          Labs and Other Follow-ups after Discharge:  Follow Up instructions and Outpatient Referrals     Discharge instructions          Future Appointments:  Appointments which have been scheduled for you      Nov 05, 2022 10:30 AM  (Arrive by 10:00 AM)  RETURN  HEPATOLOGY with Janyth Pupa, MD  [facility] LIVER TRANSPLANT [place] [facility] REGION) [address]  [place] [address]  [phone]        Nov 07, 2022  2:00 PM  (Arrive by 1:30 PM)  RETURN VIDEO HCP MYCHART with TRANSPLANT SURGERY PHARMACY  [facility] TRANSPLANT SURGERY  [facility] REGION) [address]  [place] [address]  [phone]   Please sign into My [place] Chart at least 15 minutes before your appointment to complete the eCheck-In process. You must complete eCheck-In before you can  start your video visit. We also recommend testing your audio and video connection to troubleshoot any issues before your visit begins. Click ???Join Video Visit??? to complete these checks. Once you have completed eCheck-In and tested your audio and video, click ???Join Call??? to connect to your visit.     For your video visit, you will need a computer with a working camera, speaker and microphone, a smartphone, or a tablet with internet access.    My [place] Chart enables you to manage your health, send non-urgent messages to your provider, view your test results, schedule and manage appointments, and request prescription refills securely and conveniently from your computer or mobile device.    You can go to [email or web address] to sign in to your My [place] Chart account with your username and password. If you have forgotten your username or password, please choose the ???Forgot Username???? and/or ???Forgot Password???? links to gain access. You also can access your My [place] Chart account with the free MyChart mobile app for Android or iPhone.    If you need assistance accessing your My [place] Chart account or for assistance in reaching your provider's office to reschedule or cancel your appointment, please call [facility] [phone].         Nov 15, 2022  9:30 AM  (Arrive by 9:00 AM)  LAB ONLY [place] with ADULT ONC LAB  [facility] ADULT ONCOLOGY LAB DRAW STATION [place] [facility] REGION) [address]  [place] [address]  [phone]        Nov 15, 2022 10:30 AM  (Arrive by 10:00 AM)  RETURN ACTIVE [place] with Marene Lenz, MD  [facility] ONCOLOGY MULTIDISCIPLINARY 2ND FLR CANCER HOSP [facility] REGION) [address]  [place] [address]  [phone]        Jan 31, 2023  9:00 AM  (Arrive by 8:30 AM)  RETURN VIDEO HCP MYCHART with Smiley Houseman  [facility] LIVER TRANSPLANT [place] [facility] REGION) [address]  [place] [address]  [phone]   Please sign into My [place] Chart at least 15 minutes before your appointment to complete the eCheck-In process. You must complete eCheck-In before you can start your video visit. We also recommend testing your audio and video connection to troubleshoot any issues before your visit begins. Click ???Join Video Visit??? to complete these checks. Once you have completed eCheck-In and tested your audio and video, click ???Join Call??? to connect to your visit.     For your video visit, you will need a computer with a working camera, speaker and microphone, a smartphone, or a tablet with internet access.    My [place] Chart enables you to manage your health, send non-urgent messages to your provider, view your test results, schedule and manage appointments, and request prescription refills securely and conveniently from your computer or mobile device.    You can go to [email or web address] to sign in to your My [place] Chart account with your username and password. If you have forgotten your username or password, please choose the ???Forgot Username???? and/or ???Forgot Password???? links to gain access. You also can access your My [place] Chart account with the free MyChart mobile app for Android or iPhone.    If you need assistance accessing your My [place] Chart account or for assistance in reaching your provider's office to reschedule or cancel your appointment, please call [facility] [phone].  Jan 31, 2023 11:30 AM  (Arrive by 11:00 AM)  RETURN VIDEO HCP MYCHART with Alvester Chou, PhD  [facility] TRANSPLANT SURGERY  [facility] REGION) [address]  [place] [address]  [phone]   Please sign into My [place] Chart at least 15 minutes before your appointment to complete the eCheck-In process. You must complete eCheck-In before you can start your video visit. We also recommend testing your audio and video connection to troubleshoot any issues before your visit begins. Click ???Join Video Visit??? to complete these checks. Once you have completed eCheck-In and tested your audio and video, click ???Join Call??? to connect to your visit.     For your video visit, you will need a computer with a working camera, speaker and microphone, a smartphone, or a tablet with internet access.    My [place] Chart enables you to manage your health, send non-urgent messages to your provider, view your test results, schedule and manage appointments, and request prescription refills securely and conveniently from your computer or mobile device.    You can go to [email or web address] to sign in to your My [place] Chart account with your username and password. If you have forgotten your username or password, please choose the ???Forgot Username???? and/or ???Forgot Password???? links to gain access. You also can access your My [place] Chart account with the free MyChart mobile app for Android or iPhone.    If you need assistance accessing your My [place] Chart account or for assistance in reaching your provider's office to reschedule or cancel your appointment, please call [facility] [phone].         Jan 31, 2023  1:00 PM  (Arrive by 12:45 PM)  XR DEXA BONE DENSITY SKELETAL with IC DEXA RM 1  IMG DEXA IMAGING CENTER [facility]) [address]  [address]  [place] [address]  [phone]   No calcium supplements 24 hrs prior.         Jan 31, 2023  3:30 PM  (Arrive by 3:00 PM)  RETURN ACTIVE [place] with Marene Lenz, MD  [facility] ONCOLOGY MULTIDISCIPLINARY 2ND FLR CANCER HOSP [facility] REGION) [address]  [place] [address]  [phone]        Feb 06, 2023  8:30 AM  (Arrive by 8:00 AM)  RETURN VIDEO HCP [facility] with TRANSPLANT SURGERY PHARMACY  [facility] TRANSPLANT SURGERY  [facility] REGION) [address]  [place] [address]  [phone]   Please sign into My [place] Chart at least 15 minutes before your appointment to complete the eCheck-In process. You must complete eCheck-In before you can start your video visit. We also recommend testing your audio and video connection to troubleshoot any issues before your visit begins. Click ???Join Video Visit??? to complete these checks. Once you have completed eCheck-In and tested your audio and video, click ???Join Call??? to connect to your visit.     For your video visit, you will need a computer with a working camera, speaker and microphone, a smartphone, or a tablet with internet access.    My [place] Chart enables you to manage your health, send non-urgent messages to your provider, view your test results, schedule and manage appointments, and request prescription refills securely and conveniently from your computer or mobile device.    You can go to [email or web address] to sign in to your My [place] Chart account with your username and  password. If you have forgotten your username or password, please choose the ???Forgot Username???? and/or ???Forgot Password???? links to gain access. You also can access your My [place] Chart account with the free MyChart mobile app for Android or iPhone.    If you need assistance accessing your My [place] Chart account or for assistance in reaching your provider's office to reschedule or cancel your appointment, please call [facility] [phone].         Feb 06, 2023 10:00 AM  (Arrive by 9:30 AM)  RETURN VIDEO HCP MYCHART with Jackolyn Confer, RD/LDN  [facility] NUTRITION SERVICES TRANSPLANT [facility] [facility] REGION) [address]  [place] [address]  [phone]   Please sign into My [place] Chart at least 15 minutes before your appointment to complete the eCheck-In process. You must complete eCheck-In before you can start your video visit. We also recommend testing your audio and video connection to troubleshoot any issues before your visit begins. Click ???Join Video Visit??? to complete these checks. Once you have completed eCheck-In and tested your audio and video, click ???Join Call??? to connect to your visit.     For your video visit, you will need a computer with a working camera, speaker and microphone, a smartphone, or a tablet with internet access.    My [place] Chart enables you to manage your health, send non-urgent messages to your provider, view your test results, schedule and manage appointments, and request prescription refills securely and conveniently from your computer or mobile device.    You can go to [email or web address] to sign in to your My [place] Chart account with your username and password. If you have forgotten your username or password, please choose the ???Forgot Username???? and/or ???Forgot Password???? links to gain access. You also can access your My [place] Chart account with the free MyChart mobile app for Android or iPhone.    If you need assistance accessing your My  Chart account or for assistance in reaching your provider's office to reschedule or cancel your appointment, please call [facility] [phone].

## 2022-10-14 NOTE — Unmapped (Signed)
Patient's 2/29 Abd MRI reviewed by Dr.Moon, who recommended patient repeat again in 6 mos to monitor pancreatic cysts. Relayed this to patient, who was just discharged. He verbalized understanding.

## 2022-10-14 NOTE — Unmapped (Signed)
Patient remained [name], independent, continent x2 throughout shift. Medications given per MAR. FSBS monitored ACHS, with insulin coverage given per [facility]. Patient voices no concerns at this time. Patient safety ensured with bed low and locked, call bell within reach, and nonskid slippers on.     Problem: Adult Inpatient Plan of Care  Goal: Plan of Care Review  Outcome: Progressing  Goal: Patient-Specific Goal (Individualized)  Outcome: Progressing  Goal: Absence of Hospital-Acquired Illness or Injury  Outcome: Progressing  Intervention: Identify and Manage Fall Risk  Recent Flowsheet Documentation  Taken 10/13/2022 1601 by Stephanie Acre, RN  Safety Interventions:   fall reduction program maintained   lighting adjusted for tasks/safety   low bed   nonskid shoes/slippers when out of bed  Taken 10/13/2022 1400 by Stephanie Acre, RN  Safety Interventions:   fall reduction program maintained   lighting adjusted for tasks/safety   low bed   nonskid shoes/slippers when out of bed  Taken 10/13/2022 1200 by Stephanie Acre, RN  Safety Interventions:   fall reduction program maintained   lighting adjusted for tasks/safety   low bed   nonskid shoes/slippers when out of bed  Taken 10/13/2022 1000 by Stephanie Acre, RN  Safety Interventions:   fall reduction program maintained   lighting adjusted for tasks/safety   low bed   nonskid shoes/slippers when out of bed  Taken 10/13/2022 0800 by Stephanie Acre, RN  Safety Interventions:   fall reduction program maintained   lighting adjusted for tasks/safety   low bed   nonskid shoes/slippers when out of bed  Goal: Optimal Comfort and Wellbeing  Outcome: Progressing  Goal: Readiness for Transition of Care  Outcome: Progressing  Goal: Rounds/Family Conference  Outcome: Progressing

## 2022-10-16 DIAGNOSIS — Z5181 Encounter for therapeutic drug level monitoring: Principal | ICD-10-CM

## 2022-10-16 DIAGNOSIS — Z944 Liver transplant status: Principal | ICD-10-CM

## 2022-10-16 DIAGNOSIS — E612 Magnesium deficiency: Principal | ICD-10-CM

## 2022-10-16 LAB — DECEASED DONOR CL I&II, LOW RES
DONOR LOW RES DRW #1: 52
DONOR LOW RES DRW #2: 53
DONOR LOW RES HLA A #1: 3
DONOR LOW RES HLA A #2: 32
DONOR LOW RES HLA B #1: 7
DONOR LOW RES HLA B #2: 45
DONOR LOW RES HLA BW #1: 6
DONOR LOW RES HLA C #1: 6
DONOR LOW RES HLA C #2: 7
DONOR LOW RES HLA DQ #1: 7
DONOR LOW RES HLA DQ #2: 8
DONOR LOW RES HLA DR #1: 4
DONOR LOW RES HLA DR #2: 11

## 2022-10-16 NOTE — Unmapped (Signed)
[facility] Refill Coordination Note    Specialty Medication(s) to be Shipped:   Transplant: everolimus 0.5 mg    Other medication(s) to be shipped:  loperamide and melatonin      Matthew Key, DOB: 11-15-1960  Phone: [phone] (home) [phone] (work)      All above HIPAA information was verified with patient.     Was a Nurse, learning disability used for this call? No    Completed refill call assessment today to schedule patient's medication shipment from the [facility] [phone]).  All relevant notes have been reviewed.     Specialty medication(s) and dose(s) confirmed: Regimen is correct and unchanged.   Changes to medications: Ellis reports no changes at this time.  Changes to insurance: No  New side effects reported not previously addressed with a pharmacist or physician: None reported  Questions for the pharmacist: No    Confirmed patient received a Conservation officer, historic buildings and a Surveyor, mining with first shipment. The patient will receive a drug information handout for each medication shipped and additional FDA Medication Guides as required.       DISEASE/MEDICATION-SPECIFIC INFORMATION        N/A    SPECIALTY MEDICATION ADHERENCE     Medication Adherence    Patient reported X missed doses in the last month: 0  Specialty Medication: everolimus (immunosuppressive) 0.5 mg tablet (ZORTRESS)  Patient is on additional specialty medications: No              Were doses missed due to medication being on hold? No    everolimus 0.5 mg: 10 days of medicine on hand       REFERRAL TO PHARMACIST     Referral to the pharmacist: Not needed      [facility]     Shipping address confirmed in Epic.     Patient was notified of new phone menu : No    Delivery Scheduled: Yes, Expected medication delivery date: 10/22/22.     Medication will be delivered via UPS to the prescription address in Epic WAM.    Matthew Key   [facility] Specialty Technician

## 2022-10-21 DIAGNOSIS — Z5181 Encounter for therapeutic drug level monitoring: Principal | ICD-10-CM

## 2022-10-21 DIAGNOSIS — E612 Magnesium deficiency: Principal | ICD-10-CM

## 2022-10-21 DIAGNOSIS — Z944 Liver transplant status: Principal | ICD-10-CM

## 2022-10-22 MED FILL — LOPERAMIDE 2 MG CAPSULE: 46 days supply | Qty: 60 | Fill #1

## 2022-10-22 MED FILL — TACROLIMUS 1 MG CAPSULE, IMMEDIATE-RELEASE: ORAL | 30 days supply | Qty: 120 | Fill #1

## 2022-10-22 MED FILL — EVEROLIMUS (IMMUNOSUPPRESSIVE) 0.5 MG TABLET: ORAL | 26 days supply | Qty: 180 | Fill #2

## 2022-10-22 MED FILL — MELATONIN 3 MG TABLET: ORAL | 60 days supply | Qty: 60 | Fill #3

## 2022-10-23 LAB — COMPREHENSIVE METABOLIC PANEL
A/G RATIO: 1.6 (ref 1.2–2.2)
ALBUMIN: 4.1 g/dL (ref 3.9–4.9)
ALKALINE PHOSPHATASE: 259 IU/L — ABNORMAL HIGH (ref 44–121)
ALT (SGPT): 63 IU/L — ABNORMAL HIGH (ref 0–44)
AST (SGOT): 37 IU/L (ref 0–40)
BILIRUBIN TOTAL (MG/DL) IN SER/PLAS: 0.3 mg/dL (ref 0.0–1.2)
BLOOD UREA NITROGEN: 20 mg/dL (ref 8–27)
BUN / CREAT RATIO: 13 (ref 10–24)
CALCIUM: 9.5 mg/dL (ref 8.6–10.2)
CHLORIDE: 102 mmol/L (ref 96–106)
CO2: 23 mmol/L (ref 20–29)
CREATININE: 1.55 mg/dL — ABNORMAL HIGH (ref 0.76–1.27)
GLOBULIN, TOTAL: 2.6 g/dL (ref 1.5–4.5)
GLUCOSE: 174 mg/dL — ABNORMAL HIGH (ref 70–99)
POTASSIUM: 4.9 mmol/L (ref 3.5–5.2)
SODIUM: 138 mmol/L (ref 134–144)
TOTAL PROTEIN: 6.7 g/dL (ref 6.0–8.5)

## 2022-10-23 LAB — CBC W/ DIFFERENTIAL
BANDED NEUTROPHILS ABSOLUTE COUNT: 0 10*3/uL (ref 0.0–0.1)
BASOPHILS ABSOLUTE COUNT: 0 10*3/uL (ref 0.0–0.2)
BASOPHILS RELATIVE PERCENT: 0 %
EOSINOPHILS ABSOLUTE COUNT: 0.2 10*3/uL (ref 0.0–0.4)
EOSINOPHILS RELATIVE PERCENT: 2 %
HEMATOCRIT: 42.3 % (ref 37.5–51.0)
HEMOGLOBIN: 13.3 g/dL (ref 13.0–17.7)
IMMATURE GRANULOCYTES: 1 %
LYMPHOCYTES ABSOLUTE COUNT: 1.7 10*3/uL (ref 0.7–3.1)
LYMPHOCYTES RELATIVE PERCENT: 25 %
MEAN CORPUSCULAR HEMOGLOBIN CONC: 31.4 g/dL — ABNORMAL LOW (ref 31.5–35.7)
MEAN CORPUSCULAR HEMOGLOBIN: 25.6 pg — ABNORMAL LOW (ref 26.6–33.0)
MEAN CORPUSCULAR VOLUME: 81 fL (ref 79–97)
MONOCYTES ABSOLUTE COUNT: 0.7 10*3/uL (ref 0.1–0.9)
MONOCYTES RELATIVE PERCENT: 11 %
NEUTROPHILS ABSOLUTE COUNT: 4 10*3/uL (ref 1.4–7.0)
NEUTROPHILS RELATIVE PERCENT: 61 %
PLATELET COUNT: 199 10*3/uL (ref 150–450)
RED BLOOD CELL COUNT: 5.2 x10E6/uL (ref 4.14–5.80)
RED CELL DISTRIBUTION WIDTH: 14.4 % (ref 11.6–15.4)
WHITE BLOOD CELL COUNT: 6.6 10*3/uL (ref 3.4–10.8)

## 2022-10-23 LAB — GAMMA GT: GAMMA GLUTAMYL TRANSFERASE: 62 IU/L (ref 0–65)

## 2022-10-23 LAB — MAGNESIUM: MAGNESIUM: 2.3 mg/dL (ref 1.6–2.3)

## 2022-10-23 LAB — PHOSPHORUS: PHOSPHORUS, SERUM: 4.5 mg/dL — ABNORMAL HIGH (ref 2.8–4.1)

## 2022-10-23 LAB — BILIRUBIN, DIRECT: BILIRUBIN DIRECT: 0.1 mg/dL (ref 0.00–0.40)

## 2022-10-23 LAB — TACROLIMUS LEVEL: TACROLIMUS BLOOD: 5.7 ng/mL (ref 2.0–20.0)

## 2022-10-24 LAB — EVEROLIMUS: EVEROLIMUS LEVEL: 4.3 ng/mL (ref 3.0–8.0)

## 2022-10-28 MED FILL — OZEMPIC 0.25 MG OR 0.5 MG (2 MG/3 ML) SUBCUTANEOUS PEN INJECTOR: SUBCUTANEOUS | 28 days supply | Qty: 3 | Fill #1

## 2022-10-29 NOTE — Unmapped (Addendum)
Discussed patient's BP metrix in Dashboard mtg with Dr.Shroff last Friday. Recommendaiton made to instruct patient to monitor his BPs at home and will plan to reach out to Pharmacy re.adding an add'l BP agent if needed. Left VM for patient to return call to [facility].    Contacted by Dr.Moon in the interim who recommended he start lisinopril 2.5 mg daily monitor daily BPs, reporting back after 2 weeks,       Patient contacted [facility] today (3/20) and stated that he has been monitoring his BPs at home, and they have been stable from 130-140/70s and not nearly as high as when he was in the hospital. Relayed Dr. Laban Emperor recommendation to start lisinopril and monitor his BPs and report back to [facility]. He said he thought he might have some supply at home. Reinforced the importance that it be a 2.5 mg dose and sent new script to his local [facility] pharmacy. He verbalized understanding and agreed to continue monitoring/recording them to share with the txp team.      He inquired about the need to refill his methocarbamol, but when he realized it was robaxin, he said he did not feel he needed it right now for his arm pain, since it has improved. He agreed to reach out if pain recuts.

## 2022-10-30 LAB — CBC W/ DIFFERENTIAL
BANDED NEUTROPHILS ABSOLUTE COUNT: 0 10*3/uL (ref 0.0–0.1)
BASOPHILS ABSOLUTE COUNT: 0 10*3/uL (ref 0.0–0.2)
BASOPHILS RELATIVE PERCENT: 0 %
EOSINOPHILS ABSOLUTE COUNT: 0.2 10*3/uL (ref 0.0–0.4)
EOSINOPHILS RELATIVE PERCENT: 3 %
HEMATOCRIT: 42.8 % (ref 37.5–51.0)
HEMOGLOBIN: 13.8 g/dL (ref 13.0–17.7)
IMMATURE GRANULOCYTES: 0 %
LYMPHOCYTES ABSOLUTE COUNT: 1.6 10*3/uL (ref 0.7–3.1)
LYMPHOCYTES RELATIVE PERCENT: 22 %
MEAN CORPUSCULAR HEMOGLOBIN CONC: 32.2 g/dL (ref 31.5–35.7)
MEAN CORPUSCULAR HEMOGLOBIN: 25.7 pg — ABNORMAL LOW (ref 26.6–33.0)
MEAN CORPUSCULAR VOLUME: 80 fL (ref 79–97)
MONOCYTES ABSOLUTE COUNT: 0.6 10*3/uL (ref 0.1–0.9)
MONOCYTES RELATIVE PERCENT: 8 %
NEUTROPHILS ABSOLUTE COUNT: 4.7 10*3/uL (ref 1.4–7.0)
NEUTROPHILS RELATIVE PERCENT: 67 %
PLATELET COUNT: 228 10*3/uL (ref 150–450)
RED BLOOD CELL COUNT: 5.38 x10E6/uL (ref 4.14–5.80)
RED CELL DISTRIBUTION WIDTH: 13.8 % (ref 11.6–15.4)
WHITE BLOOD CELL COUNT: 7.1 10*3/uL (ref 3.4–10.8)

## 2022-10-30 LAB — COMPREHENSIVE METABOLIC PANEL
A/G RATIO: 1.7 (ref 1.2–2.2)
ALBUMIN: 4.2 g/dL (ref 3.9–4.9)
ALKALINE PHOSPHATASE: 287 IU/L — ABNORMAL HIGH (ref 44–121)
ALT (SGPT): 35 IU/L (ref 0–44)
AST (SGOT): 26 IU/L (ref 0–40)
BILIRUBIN TOTAL (MG/DL) IN SER/PLAS: 0.3 mg/dL (ref 0.0–1.2)
BLOOD UREA NITROGEN: 17 mg/dL (ref 8–27)
BUN / CREAT RATIO: 12 (ref 10–24)
CALCIUM: 9.6 mg/dL (ref 8.6–10.2)
CHLORIDE: 101 mmol/L (ref 96–106)
CO2: 20 mmol/L (ref 20–29)
CREATININE: 1.4 mg/dL — ABNORMAL HIGH (ref 0.76–1.27)
GLOBULIN, TOTAL: 2.5 g/dL (ref 1.5–4.5)
GLUCOSE: 172 mg/dL — ABNORMAL HIGH (ref 70–99)
POTASSIUM: 5.1 mmol/L (ref 3.5–5.2)
SODIUM: 138 mmol/L (ref 134–144)
TOTAL PROTEIN: 6.7 g/dL (ref 6.0–8.5)

## 2022-10-30 LAB — GAMMA GT: GAMMA GLUTAMYL TRANSFERASE: 45 IU/L (ref 0–65)

## 2022-10-30 LAB — MAGNESIUM: MAGNESIUM: 1.8 mg/dL (ref 1.6–2.3)

## 2022-10-30 LAB — PHOSPHORUS: PHOSPHORUS, SERUM: 3.9 mg/dL (ref 2.8–4.1)

## 2022-10-30 LAB — BILIRUBIN, DIRECT: BILIRUBIN DIRECT: <0.1 mg/dL (ref 0.00–0.40)

## 2022-10-30 MED ORDER — LISINOPRIL 2.5 MG TABLET
ORAL_TABLET | Freq: Every day | ORAL | 3 refills | 90 days | Status: CP
Start: 2022-10-30 — End: 2023-10-30

## 2022-10-30 NOTE — Unmapped (Signed)
Addended by: Genia Harold on: 10/30/2022 04:23 PM     Modules accepted: Orders

## 2022-10-31 LAB — TACROLIMUS LEVEL: TACROLIMUS BLOOD: 4.8 ng/mL (ref 2.0–20.0)

## 2022-10-31 LAB — EVEROLIMUS: EVEROLIMUS LEVEL: 4.1 ng/mL (ref 3.0–8.0)

## 2022-11-05 ENCOUNTER — Ambulatory Visit: Admit: 2022-11-05 | Discharge: 2022-11-06 | Payer: MEDICARE

## 2022-11-05 DIAGNOSIS — I10 Essential (primary) hypertension: Principal | ICD-10-CM

## 2022-11-05 DIAGNOSIS — M25531 Pain in right wrist: Principal | ICD-10-CM

## 2022-11-05 DIAGNOSIS — E875 Hyperkalemia: Principal | ICD-10-CM

## 2022-11-05 LAB — COMPREHENSIVE METABOLIC PANEL
ALBUMIN: 3.5 g/dL (ref 3.4–5.0)
ALKALINE PHOSPHATASE: 229 U/L — ABNORMAL HIGH (ref 46–116)
ALT (SGPT): 26 U/L (ref 10–49)
ANION GAP: 3 mmol/L — ABNORMAL LOW (ref 5–14)
AST (SGOT): 22 U/L (ref <=34)
BILIRUBIN TOTAL: 0.2 mg/dL — ABNORMAL LOW (ref 0.3–1.2)
BLOOD UREA NITROGEN: 19 mg/dL (ref 9–23)
BUN / CREAT RATIO: 13
CALCIUM: 9.9 mg/dL (ref 8.7–10.4)
CHLORIDE: 105 mmol/L (ref 98–107)
CO2: 29 mmol/L (ref 20.0–31.0)
CREATININE: 1.43 mg/dL — ABNORMAL HIGH
EGFR CKD-EPI (2021) MALE: 55 mL/min/{1.73_m2} — ABNORMAL LOW (ref >=60)
GLUCOSE RANDOM: 153 mg/dL — ABNORMAL HIGH (ref 70–99)
POTASSIUM: 5.8 mmol/L — ABNORMAL HIGH (ref 3.4–4.8)
PROTEIN TOTAL: 7.2 g/dL (ref 5.7–8.2)
SODIUM: 137 mmol/L (ref 135–145)

## 2022-11-05 LAB — CBC W/ AUTO DIFF
BASOPHILS ABSOLUTE COUNT: 0 10*9/L (ref 0.0–0.1)
BASOPHILS RELATIVE PERCENT: 0.2 %
EOSINOPHILS ABSOLUTE COUNT: 0.2 10*9/L (ref 0.0–0.5)
EOSINOPHILS RELATIVE PERCENT: 2.6 %
HEMATOCRIT: 38.3 % — ABNORMAL LOW (ref 39.0–48.0)
HEMOGLOBIN: 12.8 g/dL — ABNORMAL LOW (ref 12.9–16.5)
LYMPHOCYTES ABSOLUTE COUNT: 1.6 10*9/L (ref 1.1–3.6)
LYMPHOCYTES RELATIVE PERCENT: 25.1 %
MEAN CORPUSCULAR HEMOGLOBIN CONC: 33.3 g/dL (ref 32.0–36.0)
MEAN CORPUSCULAR HEMOGLOBIN: 25.7 pg — ABNORMAL LOW (ref 25.9–32.4)
MEAN CORPUSCULAR VOLUME: 77.2 fL — ABNORMAL LOW (ref 77.6–95.7)
MEAN PLATELET VOLUME: 9.7 fL (ref 6.8–10.7)
MONOCYTES ABSOLUTE COUNT: 0.8 10*9/L (ref 0.3–0.8)
MONOCYTES RELATIVE PERCENT: 11.8 %
NEUTROPHILS ABSOLUTE COUNT: 3.9 10*9/L (ref 1.8–7.8)
NEUTROPHILS RELATIVE PERCENT: 60.3 %
PLATELET COUNT: 182 10*9/L (ref 150–450)
RED BLOOD CELL COUNT: 4.96 10*12/L (ref 4.26–5.60)
RED CELL DISTRIBUTION WIDTH: 14.8 % (ref 12.2–15.2)
WBC ADJUSTED: 6.5 10*9/L (ref 3.6–11.2)

## 2022-11-05 LAB — GAMMA GT: GAMMA GLUTAMYL TRANSFERASE: 36 U/L

## 2022-11-05 LAB — TACROLIMUS LEVEL, TROUGH: TACROLIMUS, TROUGH: 2.8 ng/mL — ABNORMAL LOW (ref 5.0–15.0)

## 2022-11-05 LAB — EVEROLIMUS: EVEROLIMUS LEVEL: 2.4 ng/mL — ABNORMAL LOW (ref 3.0–15.0)

## 2022-11-05 LAB — PHOSPHORUS: PHOSPHORUS: 3.3 mg/dL (ref 2.4–5.1)

## 2022-11-05 LAB — MAGNESIUM: MAGNESIUM: 1.9 mg/dL (ref 1.6–2.6)

## 2022-11-05 LAB — BILIRUBIN, DIRECT: BILIRUBIN DIRECT: 0.1 mg/dL (ref 0.00–0.30)

## 2022-11-05 MED ORDER — PATIROMER CALCIUM SORBITEX 8.4 GRAM ORAL POWDER PACKET
PACK | Freq: Every day | ORAL | 0 refills | 1 days | Status: CP
Start: 2022-11-05 — End: ?

## 2022-11-05 MED ORDER — CHLORTHALIDONE 25 MG TABLET
ORAL_TABLET | Freq: Every morning | ORAL | 11 refills | 30 days | Status: CP
Start: 2022-11-05 — End: 2023-11-05

## 2022-11-05 MED ORDER — GABAPENTIN 300 MG CAPSULE
ORAL_CAPSULE | Freq: Three times a day (TID) | ORAL | 2 refills | 30 days
Start: 2022-11-05 — End: ?

## 2022-11-05 NOTE — Unmapped (Signed)
Patient mistakenly came for appt today that was canceled yesterday, since it was not scheduled with his primary provider. Patient did not recall discussion with primary [facility] a few weeks ago about this. Offered apology and patient was very understanding. Encouraged him to refer to MyChart and text notifications for appt changes, since this was canceled yesterday.

## 2022-11-05 NOTE — Unmapped (Addendum)
Patient's CMP resulted today with K-5.8. Reached out to patient to discuss status. He denied any dizziness, palpitations, sob or chest pain. Encouraged him to avoid potatoes for a few days and to seek urgent attn if he develops any of the sx described. He reported that he took the lisinopril last Friday and experienced marked light-headedness and pallor-BP that morning was 100/63 and 120/55 that night. He did not take it any more after that and his BPs since have been as follows: 142/65, 137/64, 140/69. On discussion between Dr.Moon and PharmD Chargualaf, formal recommendation was to discontinue lisinopril and beging chlothalidone 25mg  daily and have patient take veltassa 8.4mg  once tonight. Spoke with patient and relayed recommendation. He verbalized understanding and said he did not have any Lokelma or Veltassa at home, but agreed to get it from the pharmacy and take it tonight.

## 2022-11-05 NOTE — Unmapped (Signed)
The patient is requesting a medication refill

## 2022-11-06 DIAGNOSIS — Z796 Long-term use of immunosuppressant medication: Principal | ICD-10-CM

## 2022-11-06 DIAGNOSIS — Z944 Liver transplant status: Principal | ICD-10-CM

## 2022-11-06 MED ORDER — TACROLIMUS 1 MG CAPSULE, IMMEDIATE-RELEASE
ORAL_CAPSULE | 11 refills | 0 days | Status: CP
Start: 2022-11-06 — End: ?

## 2022-11-06 MED ORDER — EVEROLIMUS (IMMUNOSUPPRESSIVE) 0.5 MG TABLET
ORAL_TABLET | Freq: Two times a day (BID) | ORAL | 11 refills | 30 days | Status: CP
Start: 2022-11-06 — End: ?

## 2022-11-06 NOTE — Unmapped (Signed)
Patient's Tac and Everolimus were below goal this week with 3/26 results. Discussed with PharmD Chargualaf, who recommended increasing his Tac to 3mg /2mg  daily and his EVL to 2 mg BID. Spoke with patient, who confirmed that the levels were reliable. Asked patient to review med sheet and adjust it while relaying recommendation changes. He verbalized understanding and agreed to take add'l 1mg  Tac cap now. He reported that he picked up the Veltassa last night and took it, then realized he has several 16.8mg  and some 8.4mg  doses in the fridge from a previous prescription. He started chlorthalidone today.

## 2022-11-06 NOTE — Unmapped (Signed)
Patient Matthew Key was contacted today regarding upcoming scheduling MRI . Voicemail was left for patient with information to call back.      [name]

## 2022-11-07 ENCOUNTER — Telehealth: Admit: 2022-11-07 | Discharge: 2022-11-08 | Payer: MEDICARE

## 2022-11-07 NOTE — Unmapped (Signed)
[facility] PHARMACY NOTE  Matthew Key  [id number]      The patient reports they are physically located in [place] and is currently: at home. I conducted a audio/video visit. I spent  79m 51s on the video call with the patient. I spent an additional 10 minutes on pre- and post-visit activities on the date of service .        Medication changes today:   No medication changes     Education/Adherence tools provided today:  - Provided additional education on immunosuppression and transplant related medications including reviewing indications of medications, dosing and side effects    Follow up items:  Goal of understanding indications and dosing of immunosuppression medications  BP  A1c  Diarrhea  Potassium     Next visit with pharmacy in 1-3 months  ____________________________________________________________________    Matthew Key is a 62 y.o. 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     Immunologic Risk: first transplant    Induction Agent : basiliximab    Donor Factors: DBD    Other PMH significant for Hypertension and Stroke, T2DM, OSA on CPAP    Post op course uncomplicated  8/8-8/9 hospitalized due to elevated liver enzymes; He was evaluated with a liver doppler ultrasound which showed patent veins and arteries. Labs were obtained and showed improvement in LFTs.  8/10: EMG with no findings of neuropathy  8/17: Instructed to increase Tresiba from 15 to 17 units daily; start K-lowering supplement.  8/25: increase Mg to 2 tabs BID, continue daily veltassa    Rejection History: NTD  Infection History: NTD  ___________________________________________________________________    Last seen by pharmacy ~ 1 months ago    Interval History:   Admission 2/29-3/4: transaminitis, ERCP 3/1 w stent removal, stricture gone, stones and sludge swept.   3/19: started lisinopril 2.5mg  daily  3/26: switched to chlorthalidone from lisinopril due to lightheadedness, palor. Started lokelma for hyperkalemia.     Seen by pharmacy today for: medication management and blood glucose management and education    CC:  Patient has no complaints today    General: No issues  Neuro: No issues   CV: No issues   Resp: No issues   GI: Diarrhea   GU: No issues   Derm: No issues  Psych: No issues.    Fluid Status:   Edema no  SOB no  Intake: 6+ bottles of water per day  Output: not assessed  Plan: continue to monitor      There were no vitals filed for this visit.    ___________________________________________________________________    Allergies   Allergen Reactions    Venom-Honey Bee Swelling       Medications reviewed in EPIC medication station and updated today by the clinical pharmacist practitioner.    Current Outpatient Medications   Medication Instructions    acetaminophen (TYLENOL) 325-650 mg, Oral, Every 6 hours PRN    amlodipine (NORVASC) 10 mg, Oral, Daily (standard)    aspirin (ECOTRIN) 81 mg, Oral, Daily (standard)    blood sugar diagnostic (ACCU-CHEK GUIDE TEST STRIPS) Strp Use to check blood sugar as directed with insulin 3 times a day & for symptoms of high or low blood sugar    blood-glucose meter kit Use as instructed    blood-glucose meter Misc Use to test blood sugar as directed    chlorthalidone (HYGROTON) 25 mg, Oral, Every morning    DULoxetine (CYMBALTA) 60 mg,  Oral, Daily (standard)    everolimus (immunosuppressive) (ZORTRESS) 2 mg, Oral, 2 times a day (standard)    gabapentin (NEURONTIN) 300 mg, Oral, 3 times a day (standard)    glucagon spray 3 mg/actuation Spry Use 1 spray into the left nostril every fifteen (15) minutes as needed (low blood sugar).    hydrOXYzine (VISTARIL) 50 mg, Oral, Nightly PRN    insulin aspart (NOVOLOG FLEXPEN) 100 unit/mL (3 mL) injection pen Inject under the skin per sliding scale prior to meals. If premeal BG 151-200 take 2 additional units for correction, if 201-250 take 4 additional units, etc. Max dose 36 units per day. Store in-use prefilled pens at room temperature <86??F and use within 28 days; do not refrigerate.    lancets (ACCU-CHEK SOFTCLIX LANCETS) Misc Use to check blood sugar as directed with insulin 3 times a day & for symptoms of high or low blood sugar.    loperamide (IMODIUM) 2 mg capsule Take 1 capsule by mouth three times a day, end after 12/15, then take 1 capsule daily as needed for loose stools    melatonin 3 mg, Oral, Every evening    methocarbamol (ROBAXIN) 500 mg, Oral, 3 times a day PRN    OZEMPIC 0.5 mg, Subcutaneous, Every 7 days    pantoprazole (PROTONIX) 40 mg, Oral, Daily PRN    patiromer calcium sorbitex (VELTASSA) 8.4 g, Oral, Daily (standard)    pen needle, diabetic (BD ULTRA-FINE NANO PEN NEEDLE) 32 gauge x 5/32 (4 mm) Ndle Use as directed  1-2 times per day    psyllium (METAMUCIL) 3.4 gram packet 1 packet, Oral, 2 times a day (standard)    tacrolimus (PROGRAF) 1 MG capsule Take Three caps (3mg ) in AM and Take Two caps (2mg ) at PM    TRESIBA FLEXTOUCH U-100 15 Units, Subcutaneous, Daily (standard), Adjust as instructed.    ursodiol (ACTIGALL) 300 mg, Oral, 3 times a day (standard)       GRAFT FUNCTION: stable    Lab Results   Component Value Date    AST 22 11/05/2022    AST 26 10/29/2022    ALT 26 11/05/2022    ALT 35 10/29/2022    Total Bilirubin 0.2 (L) 11/05/2022    Total Bilirubin 0.3 10/29/2022      Zero hour biopsy: mild reperfusion injury on donor graft. No malignancy on explanted liver.  Biopsies to date: 12/5 - presence of kupffer cells suggesting recent/resolving hepatocellular injury     Renal Function: stable but above pre txp baseline of 0.9    Lab Results   Component Value Date    Creatinine 1.43 (H) 11/05/2022    Creatinine 1.40 (H) 10/29/2022    Creatinine 1.55 (H) 10/22/2022    Creatinine 1.22 (H) 10/14/2022    Creatinine 1.45 (H) 10/13/2022    Creatinine 1.63 (H) 10/12/2022    Creatinine 1.57 (H) 10/07/2022    Creatinine 1.67 (H) 09/30/2022       Proteinuria/UPC: No/ none.  Lab Results   Component Value Date    Protein/Creatinine Ratio, Urine 0.432 08/16/2022    Protein/Creatinine Ratio, Urine 0.156 04/08/2022       CURRENT IMMUNOSUPPRESSION:    Tacrolimus (Prograf) 3 mg every morning + 2 mg every evening    Tacrolimus Goal: 3 - 5  Everolimus 2 mg BID  Everolimus goal: 3-8    Switched from MMF to allow for decreased tac goal and preserve renal function    IMMUNOSUPPRESSION DRUG LEVELS:  Lab Results  Component Value Date    Tacrolimus, Trough 2.8 (L) 11/05/2022    Tacrolimus, Trough 3.6 (L) 10/10/2022    Tacrolimus, Trough 4.8 (L) 08/16/2022    Tacrolimus Lvl 4.8 10/29/2022    Tacrolimus Lvl 5.7 10/22/2022    Tacrolimus Lvl 3.0 10/07/2022    Tacrolimus, Timed 3.4 10/14/2022    Tacrolimus, Timed 4.7 10/13/2022    Tacrolimus, Timed 3.7 10/12/2022     Lab Results   Component Value Date    Everolimus Level 2.4 (L) 11/05/2022    Everolimus Level 4.1 10/29/2022    Everolimus Level 4.3 10/22/2022       Everolimus and Prograf level is accurate 12 hour trough     WBC/ANC:  wnl  Lab Results   Component Value Date    WBC 6.5 11/05/2022    WBC 7.1 10/29/2022       Plan: No change    OI Prophylaxis:   CMV Status: D+/ R+, moderate risk .   Ppx complete     CVA history: asa 81 mg   The ASCVD Risk score (Arnett DK, et al., 2019) failed to calculate.  No results found for: LIPID    Statin therapy: Indicated; none (atorvastatin held for elevated LFTs)  Plan: Continue to monitor       BP: Goal < 140/90.   Home BP ranges:     Date  AM Blood Pressure (mm Hg)   PM Heart Rate  (bpm) PM Blood Pressure (mm Hg) PM Heart Rate  (bpm)   3/22 100/63 84 120/55 76   3/23 142/65 81 137/64 87   3/24 140/69      3/27 147/85               Current meds include: amlodipine 10 mg daily, chlorthalidone 25mg  daily   Past meds include: lisinopril 2.5mg  daily (stopped due to lightheadedness)   Plan: out of goal. Continue to monitor    Anemia of CKD:  H/H:   Lab Results   Component Value Date    HGB 12.8 (L) 11/05/2022     Lab Results   Component Value Date    HCT 38.3 (L) 11/05/2022     Iron panel:  Lab Results   Component Value Date    IRON 85 03/11/2022    TIBC 279 03/11/2022    FERRITIN 250.9 03/11/2022     Lab Results   Component Value Date    Iron Saturation (%) 30 03/11/2022     Prior ESA use: none post transplnat  Plan: stable. No change.  Continue to monitor.     DM:   Lab Results   Component Value Date    A1C 7.5 (H) 08/16/2022   . Goal A1c < 7  A1c increasing  History of Dm? Yes: T2DM  Established with endocrinologist/PCP for BG managment? Yes: Dr. Melrose Nakayama with [facility]    Currently on: Tresiba 15 units daily, Novolog SSI 2: 50 > 150 mg/dl; Ozempic 0.5mg  weekly   Patient denies any episodes of hypoglycemia since last clinic visit  Current using ~4 units of SSI  Reports fasting BG level of 123 mg/dL    Plan: No change. Repeat A1c at next visit as able. Continue to monitor.       Electrolytes:  K is high .  Lab Results   Component Value Date    Potassium 5.8 (H) 11/05/2022    Potassium 5.1 10/29/2022    Potassium, Bld 4.2 02/06/2022    Sodium 137 11/05/2022  Sodium 138 10/29/2022    Sodium Whole Blood 141 02/06/2022    Magnesium 1.9 11/05/2022    Magnesium 1.8 10/29/2022    CO2 29.0 11/05/2022    CO2 20 10/29/2022     Meds currently on: Mg 1 tab daily  Plan: Repeat potassium levels s/p lokelma x1. Continue to monitor    GI/BM: pt reports infrequent diarrhea  Meds currently on: pantoprazole 40 mg daily PRN, loperamide PRN   Plan: Continue to monitor    Pain: pt reports moderate pain despite use of below regimen    Meds currently on: APAP PRN, gabapentin 300 mg TID, methocarbamol 500 mg TID prn (using TID most days), Cymbalta 60 mg daily  Plan: Continue to monitor      Bone health:   Vitamin D Level: last level is 43.1 . Goal > 30.   Lab Results   Component Value Date    Vitamin D Total (25OH) 43.1 05/06/2022       Lab Results   Component Value Date    Calcium 9.9 11/05/2022    Calcium 9.6 10/29/2022    Calcium 9.5 10/22/2022     Last DEXA results:  01/2020: osteopenia (spine T score -1.1, femoral neck -1.7)  Current meds include: none  Plan: Vitamin D level  within goal. Consider adding Vitamin D and calcium given osteopenia if D is low. Continue to monitor.     Anxiety:  Meds currently on: hydroxyzine 50 HS PRN (rarely needs)    Women's/Men's Health:  Matthew Key is a 62 y.o. male. Patient reports no men's/women's health issues  Plan: Continue to monitor    Immunizations:  Influenza [Annual]: Received 06/2021, 05/06/22    PCV13: Received 11/2017  PPSV23: Received 03/2020  PCV20: Received 08/2021    Shingrix Zoster [2 doses, 2 - 6 months apart]: Received 05/2020, 07/2020    COVID-19 [3 primary doses, 2 boosters]: 1st dose given 10/2019, 2nd dose given 10/2019, 3rd dose given 05/2020, and Booster given 08/2021 (bivalent)    Immunization status: Recommended COVID-19 and RSV vaccines.     Pharmacy preference:  [facility]  Medication Refills:  N/a  Medication Access:  N/a    Adherence: Patient has good understanding of medications; was able to independently identify names/doses of immunosuppressants and OI meds.  Patient  does not fill their own pill box on a regular basis at home. States he has stopped using his pillbox and takes meds from bottle utilizing MyChart and MedAction Plan to know which medications to take.   Patient brought medication card:no  Pill box:did not bring (virtual visit)   Plan: Asked that he bring filled box for pharmacist review to next visit; provided extensive adherence counseling/intervention    Patient was reviewed with  Dr. Ruffin Frederick  who was agreement with the stated plan:     During this visit, the following was completed:   BG log data assessment  BP log data assessment  Labs ordered and evaluated  complex treatment plan >1 DS   I spent a total of 40 minutes face to face with the patient delivering clinical care and providing education/counseling.    All questions/concerns were addressed to the patient's satisfaction.  __________________________________________  Sandria Bales, CPP  Solid Organ Transplant Clinical Pharmacist Practitioner

## 2022-11-08 MED ORDER — GABAPENTIN 300 MG CAPSULE
ORAL_CAPSULE | Freq: Three times a day (TID) | ORAL | 2 refills | 30 days | Status: CP
Start: 2022-11-08 — End: ?
  Filled 2022-11-08: qty 90, 30d supply, fill #0

## 2022-11-08 MED FILL — AMLODIPINE 10 MG TABLET: ORAL | 90 days supply | Qty: 90 | Fill #1

## 2022-11-08 MED FILL — URSODIOL 300 MG CAPSULE: ORAL | 30 days supply | Qty: 90 | Fill #2

## 2022-11-08 MED FILL — DULOXETINE 30 MG CAPSULE,DELAYED RELEASE: ORAL | 90 days supply | Qty: 180 | Fill #1

## 2022-11-08 NOTE — Unmapped (Addendum)
[facility] Pharmacist has reviewed a new prescription for everolimus that indicates a dose increase.  Patient was counseled on this dosage change by [facility]- see epic note from 11/06/22.  Next refill call date adjusted if necessary.        Clinical Assessment Needed For: Dose Change  Medication: Everolimus 0.5mg  tablet  Last Fill Date/Day Supply: 10/22/2022 / 26 days  Copay $393.40  Was previous dose already scheduled to fill: No    Notes to Pharmacist: N/A    [facility] Pharmacist has reviewed a new prescription for tacrolimus that indicates a dose increase.  Patient was counseled on this dosage change by [facility]- see epic note from 11/06/22.  Next refill call date adjusted if necessary.    Clinical Assessment Needed For: Dose Change  Medication: Tacrolimus 1mg  capsule  Last Fill Date/Day Supply: 10/22/2022 / 30 days  Refill Too Soon until 11/09/2022  Was previous dose already scheduled to fill: No    Notes to Pharmacist: Will re-test on 04/01

## 2022-11-12 NOTE — Unmapped (Signed)
I was the supervising physician in the delivery of the service. Adaleen Hulgan S Jacobi Ryant, MD

## 2022-11-13 LAB — COMPREHENSIVE METABOLIC PANEL
A/G RATIO: 1.4 (ref 1.2–2.2)
ALBUMIN: 4.1 g/dL (ref 3.9–4.9)
ALKALINE PHOSPHATASE: 226 IU/L — ABNORMAL HIGH (ref 44–121)
ALT (SGPT): 31 IU/L (ref 0–44)
AST (SGOT): 25 IU/L (ref 0–40)
BILIRUBIN TOTAL (MG/DL) IN SER/PLAS: 0.3 mg/dL (ref 0.0–1.2)
BLOOD UREA NITROGEN: 28 mg/dL — ABNORMAL HIGH (ref 8–27)
BUN / CREAT RATIO: 16 (ref 10–24)
CALCIUM: 9.8 mg/dL (ref 8.6–10.2)
CHLORIDE: 101 mmol/L (ref 96–106)
CO2: 24 mmol/L (ref 20–29)
CREATININE: 1.78 mg/dL — ABNORMAL HIGH (ref 0.76–1.27)
GLOBULIN, TOTAL: 2.9 g/dL (ref 1.5–4.5)
GLUCOSE: 135 mg/dL — ABNORMAL HIGH (ref 70–99)
POTASSIUM: 5.4 mmol/L — ABNORMAL HIGH (ref 3.5–5.2)
SODIUM: 141 mmol/L (ref 134–144)
TOTAL PROTEIN: 7 g/dL (ref 6.0–8.5)

## 2022-11-13 LAB — CBC W/ DIFFERENTIAL
BANDED NEUTROPHILS ABSOLUTE COUNT: 0 10*3/uL (ref 0.0–0.1)
BASOPHILS ABSOLUTE COUNT: 0 10*3/uL (ref 0.0–0.2)
BASOPHILS RELATIVE PERCENT: 0 %
EOSINOPHILS ABSOLUTE COUNT: 0.1 10*3/uL (ref 0.0–0.4)
EOSINOPHILS RELATIVE PERCENT: 2 %
HEMATOCRIT: 41.4 % (ref 37.5–51.0)
HEMOGLOBIN: 13.4 g/dL (ref 13.0–17.7)
IMMATURE GRANULOCYTES: 0 %
LYMPHOCYTES ABSOLUTE COUNT: 1.6 10*3/uL (ref 0.7–3.1)
LYMPHOCYTES RELATIVE PERCENT: 27 %
MEAN CORPUSCULAR HEMOGLOBIN CONC: 32.4 g/dL (ref 31.5–35.7)
MEAN CORPUSCULAR HEMOGLOBIN: 25.4 pg — ABNORMAL LOW (ref 26.6–33.0)
MEAN CORPUSCULAR VOLUME: 78 fL — ABNORMAL LOW (ref 79–97)
MONOCYTES ABSOLUTE COUNT: 0.6 10*3/uL (ref 0.1–0.9)
MONOCYTES RELATIVE PERCENT: 10 %
NEUTROPHILS ABSOLUTE COUNT: 3.6 10*3/uL (ref 1.4–7.0)
NEUTROPHILS RELATIVE PERCENT: 61 %
PLATELET COUNT: 203 10*3/uL (ref 150–450)
RED BLOOD CELL COUNT: 5.28 x10E6/uL (ref 4.14–5.80)
RED CELL DISTRIBUTION WIDTH: 13.9 % (ref 11.6–15.4)
WHITE BLOOD CELL COUNT: 5.9 10*3/uL (ref 3.4–10.8)

## 2022-11-13 LAB — BILIRUBIN, DIRECT: BILIRUBIN DIRECT: <0.1 mg/dL (ref 0.00–0.40)

## 2022-11-13 LAB — PHOSPHORUS: PHOSPHORUS, SERUM: 4.3 mg/dL — ABNORMAL HIGH (ref 2.8–4.1)

## 2022-11-13 LAB — GAMMA GT: GAMMA GLUTAMYL TRANSFERASE: 28 IU/L (ref 0–65)

## 2022-11-13 LAB — MAGNESIUM: MAGNESIUM: 2.1 mg/dL (ref 1.6–2.3)

## 2022-11-13 MED ORDER — BLOOD-GLUCOSE METER KIT WRAPPER
0 refills | 0 days | Status: CN
Start: 2022-11-13 — End: 2023-11-13

## 2022-11-13 NOTE — Unmapped (Signed)
Therapy Update Follow Up: No issues - Copay = $9.81

## 2022-11-14 LAB — EVEROLIMUS: EVEROLIMUS LEVEL: 6.8 ng/mL (ref 3.0–8.0)

## 2022-11-14 LAB — TACROLIMUS LEVEL: TACROLIMUS BLOOD: 7.4 ng/mL (ref 2.0–20.0)

## 2022-11-14 MED ORDER — BLOOD-GLUCOSE METER KIT WRAPPER
0 refills | 0 days | Status: CN
Start: 2022-11-14 — End: 2023-11-14

## 2022-11-14 MED ORDER — BLOOD-GLUCOSE METER
0 refills | 0 days | Status: CN
Start: 2022-11-14 — End: 2023-11-14

## 2022-11-14 NOTE — Unmapped (Signed)
Pt request for RX Refill

## 2022-11-14 NOTE — Unmapped (Signed)
[facility] Refill Coordination Note    Specialty Medication(s) to be Shipped:   Transplant: tacrolimus 1mg  and Zortress 0.5mg     Other medication(s) to be shipped:  test strips , pen needles , novolog and ozempic     [name], DOB: Jun 11, 1961  Phone: [phone] (home) [phone] (work)      All above HIPAA information was verified with patient.     Was a Nurse, learning disability used for this call? No    Completed refill call assessment today to schedule patient's medication shipment from the [facility] [phone]).  All relevant notes have been reviewed.     Specialty medication(s) and dose(s) confirmed: Regimen is correct and unchanged.   Changes to medications: Jayven reports no changes at this time.  Changes to insurance: No  New side effects reported not previously addressed with a pharmacist or physician: None reported  Questions for the pharmacist: No    Confirmed patient received a Conservation officer, historic buildings and a Surveyor, mining with first shipment. The patient will receive a drug information handout for each medication shipped and additional FDA Medication Guides as required.       DISEASE/MEDICATION-SPECIFIC INFORMATION        N/A    SPECIALTY MEDICATION ADHERENCE     Medication Adherence    Patient reported X missed doses in the last month: 0  Specialty Medication: everolimus (immunosuppressive) 0.5 mg tablet (ZORTRESS)  Patient is on additional specialty medications: Yes  Additional Specialty Medications: tacrolimus 1 MG capsule (PROGRAF)  Patient Reported Additional Medication X Missed Doses in the Last Month: 0  Patient is on more than two specialty medications: No              Were doses missed due to medication being on hold? No    Zortress 0.5 mg: 10 days of medicine on hand   tacrolimus 1 mg: 10 days of medicine on hand     REFERRAL TO PHARMACIST     Referral to the pharmacist: Not needed      [facility]     Shipping address confirmed in Epic.     Patient was notified of new phone menu : No    Delivery Scheduled: Yes, Expected medication delivery date: 11/20/22.     Medication will be delivered via UPS to the prescription address in Epic WAM.    [name]   [facility] Specialty Technician

## 2022-11-15 MED ORDER — TACROLIMUS 1 MG CAPSULE, IMMEDIATE-RELEASE
ORAL_CAPSULE | Freq: Two times a day (BID) | ORAL | 11 refills | 30 days | Status: CP
Start: 2022-11-15 — End: ?

## 2022-11-15 NOTE — Unmapped (Signed)
Patient's 4/2 Tac level above goal and K/Cr elevated as well. DIscussed with PharmD Chargaulaf, who recommended reducing his Tac dose to 2mg  bid. Spoke with patient and relayed recommendation, as well as encouraging good hydration/dietary K modifications. He reports his BPs have been improved, but some were SBPs were up to 170, but were admittedly taken after he had been very active the hour prior. Reinforced that he should take BPs when he has been sitting for about an hour, record them for report to txp team and to call if SBPs>/=150 or DBPs>/=90. He verbalized understanding of all discussed.

## 2022-11-19 LAB — COMPREHENSIVE METABOLIC PANEL
A/G RATIO: 1.5 (ref 1.2–2.2)
ALBUMIN: 4 g/dL (ref 3.9–4.9)
ALKALINE PHOSPHATASE: 254 IU/L — ABNORMAL HIGH (ref 44–121)
ALT (SGPT): 34 IU/L (ref 0–44)
AST (SGOT): 25 IU/L (ref 0–40)
BILIRUBIN TOTAL (MG/DL) IN SER/PLAS: 0.3 mg/dL (ref 0.0–1.2)
BLOOD UREA NITROGEN: 30 mg/dL — ABNORMAL HIGH (ref 8–27)
BUN / CREAT RATIO: 15 (ref 10–24)
CALCIUM: 9.7 mg/dL (ref 8.6–10.2)
CHLORIDE: 96 mmol/L (ref 96–106)
CO2: 25 mmol/L (ref 20–29)
CREATININE: 2.05 mg/dL — ABNORMAL HIGH (ref 0.76–1.27)
GLOBULIN, TOTAL: 2.7 g/dL (ref 1.5–4.5)
GLUCOSE: 128 mg/dL — ABNORMAL HIGH (ref 70–99)
POTASSIUM: 4.9 mmol/L (ref 3.5–5.2)
SODIUM: 137 mmol/L (ref 134–144)
TOTAL PROTEIN: 6.7 g/dL (ref 6.0–8.5)

## 2022-11-19 LAB — CBC W/ DIFFERENTIAL
BANDED NEUTROPHILS ABSOLUTE COUNT: 0 10*3/uL (ref 0.0–0.1)
BASOPHILS ABSOLUTE COUNT: 0 10*3/uL (ref 0.0–0.2)
BASOPHILS RELATIVE PERCENT: 0 %
EOSINOPHILS ABSOLUTE COUNT: 0.1 10*3/uL (ref 0.0–0.4)
EOSINOPHILS RELATIVE PERCENT: 2 %
HEMATOCRIT: 41.9 % (ref 37.5–51.0)
HEMOGLOBIN: 13.4 g/dL (ref 13.0–17.7)
IMMATURE GRANULOCYTES: 0 %
LYMPHOCYTES ABSOLUTE COUNT: 1.4 10*3/uL (ref 0.7–3.1)
LYMPHOCYTES RELATIVE PERCENT: 20 %
MEAN CORPUSCULAR HEMOGLOBIN CONC: 32 g/dL (ref 31.5–35.7)
MEAN CORPUSCULAR HEMOGLOBIN: 24.6 pg — ABNORMAL LOW (ref 26.6–33.0)
MEAN CORPUSCULAR VOLUME: 77 fL — ABNORMAL LOW (ref 79–97)
MONOCYTES ABSOLUTE COUNT: 0.6 10*3/uL (ref 0.1–0.9)
MONOCYTES RELATIVE PERCENT: 10 %
NEUTROPHILS ABSOLUTE COUNT: 4.6 10*3/uL (ref 1.4–7.0)
NEUTROPHILS RELATIVE PERCENT: 68 %
PLATELET COUNT: 185 10*3/uL (ref 150–450)
RED BLOOD CELL COUNT: 5.45 x10E6/uL (ref 4.14–5.80)
RED CELL DISTRIBUTION WIDTH: 13.5 % (ref 11.6–15.4)
WHITE BLOOD CELL COUNT: 6.7 10*3/uL (ref 3.4–10.8)

## 2022-11-19 LAB — GAMMA GT: GAMMA GLUTAMYL TRANSFERASE: 28 IU/L (ref 0–65)

## 2022-11-19 LAB — MAGNESIUM: MAGNESIUM: 2.3 mg/dL (ref 1.6–2.3)

## 2022-11-19 LAB — PHOSPHORUS: PHOSPHORUS, SERUM: 3.6 mg/dL (ref 2.8–4.1)

## 2022-11-19 LAB — BILIRUBIN, DIRECT: BILIRUBIN DIRECT: 0.11 mg/dL (ref 0.00–0.40)

## 2022-11-19 NOTE — Unmapped (Signed)
Matthew Key 's entire shipment will be delayed as a result of insufficient inventory of the drug.     I have reached out to the patient  and communicated the delay. We will reschedule the medication for the delivery date that the patient agreed upon.  We have confirmed the delivery date as 11/21/22, via ups.

## 2022-11-20 DIAGNOSIS — Z944 Liver transplant status: Principal | ICD-10-CM

## 2022-11-20 LAB — TACROLIMUS LEVEL: TACROLIMUS BLOOD: 4.9 ng/mL (ref 2.0–20.0)

## 2022-11-20 LAB — EVEROLIMUS: EVEROLIMUS LEVEL: 5.5 ng/mL (ref 3.0–8.0)

## 2022-11-20 MED ORDER — EVEROLIMUS (IMMUNOSUPPRESSIVE) 1 MG TABLET
ORAL_TABLET | Freq: Two times a day (BID) | ORAL | 5 refills | 30 days | Status: CP
Start: 2022-11-20 — End: ?

## 2022-11-20 MED FILL — ACCU-CHEK GUIDE TEST STRIPS: 50 days supply | Qty: 200 | Fill #6

## 2022-11-20 MED FILL — NOVOLOG FLEXPEN U-100 INSULIN ASPART 100 UNIT/ML (3 ML) SUBCUTANEOUS: 41 days supply | Qty: 15 | Fill #5

## 2022-11-20 MED FILL — OZEMPIC 0.25 MG OR 0.5 MG (2 MG/3 ML) SUBCUTANEOUS PEN INJECTOR: SUBCUTANEOUS | 28 days supply | Qty: 3 | Fill #2

## 2022-11-20 MED FILL — BD ULTRA-FINE NANO PEN NEEDLE 32 GAUGE X 5/32" (4 MM): 50 days supply | Qty: 100 | Fill #4

## 2022-11-20 NOTE — Unmapped (Signed)
Patient left VM this morning, requesting return call b/c he mistakenly administered 15units of short-acting insulin (Novolog) instead of [place]. Spoke with patient this afternoon, who said he was doing alright and had referred to the internet for instruction, leading him to eat heavily since then. He tested glucose during call, which was now 189. Per PharmD Chargualaf, nothing else to be done. Discussed elevated Cr. Again, patient reports he is hydrating well with ~2 liters daily and denies having any n/v/d or starting any new meds. Discussed with Dr. Waynetta Sandy, who recommended he stop chlorthalidone, continue to monitor his BP, hydrate well and reduce his tac dose, so his level is closer to 3 and repeat labs again early next week. Reached out to pharmacist for recommendations. Spoke to patient and instructed him to stop chlorthalidone today and will plan to reach out to him tomorrow if changes in his Tac dose are recommended. He verbalized understanding.

## 2022-11-21 DIAGNOSIS — Z796 Long-term use of immunosuppressant medication: Principal | ICD-10-CM

## 2022-11-21 DIAGNOSIS — Z944 Liver transplant status: Principal | ICD-10-CM

## 2022-11-21 MED ORDER — EVEROLIMUS (IMMUNOSUPPRESSIVE) 0.5 MG TABLET
ORAL_TABLET | Freq: Two times a day (BID) | ORAL | 11 refills | 30 days | Status: CP
Start: 2022-11-21 — End: ?
  Filled 2022-11-20 – 2022-12-12 (×2): qty 240, 30d supply, fill #0

## 2022-11-21 MED ORDER — TACROLIMUS 1 MG CAPSULE, IMMEDIATE-RELEASE
ORAL_CAPSULE | 11 refills | 0 days | Status: CP
Start: 2022-11-21 — End: ?
  Filled 2022-11-20: qty 120, 30d supply, fill #0
  Filled 2022-12-12: qty 90, 30d supply, fill #0

## 2022-11-21 NOTE — Unmapped (Signed)
Per Dr.Moon's request to maintain Tac level closer to 3, PharmD Chargualaf suggested reducing his dose to 2mg /1mg  daily. Spoke with patient and relayed recommendation. He verbalized understanding. Patient also aware that the 0.5mg  strength EVL was cheaper than the 1mg  strength and opted to continue with original prescription.

## 2022-11-24 ENCOUNTER — Ambulatory Visit: Admit: 2022-11-24 | Discharge: 2022-11-25 | Payer: MEDICARE

## 2022-11-24 DIAGNOSIS — Z944 Liver transplant status: Principal | ICD-10-CM

## 2022-11-24 DIAGNOSIS — K769 Liver disease, unspecified: Principal | ICD-10-CM

## 2022-11-24 DIAGNOSIS — K862 Cyst of pancreas: Principal | ICD-10-CM

## 2022-11-24 MED ADMIN — gadoterate meglumine (DOTAREM) Soln 12 mL: 12 mL | INTRAVENOUS | @ 12:00:00 | Stop: 2022-11-24

## 2022-11-25 ENCOUNTER — Telehealth: Admit: 2022-11-25 | Discharge: 2022-11-26 | Payer: MEDICARE

## 2022-11-25 DIAGNOSIS — Z944 Liver transplant status: Principal | ICD-10-CM

## 2022-11-25 MED ORDER — PEN NEEDLE, DIABETIC 32 GAUGE X 5/32" (4 MM)
Freq: Four times a day (QID) | SUBCUTANEOUS | 11 refills | 25 days | Status: CP
Start: 2022-11-25 — End: 2023-11-25
  Filled 2022-12-12: qty 100, 25d supply, fill #0

## 2022-11-25 NOTE — Unmapped (Addendum)
[facility] Pharmacist has reviewed a new prescription for tacrolimus that indicates a dose decrease.  Patient was counseled on this dosage change by [facility]- see epic note from 11/21/22.  Next refill call date adjusted if necessary.          Clinical Assessment Needed For: Dose Change  Medication: Tacrolimus 1mg  capsule  Last Fill Date/Day Supply: 11/20/2022 / 30 days  Refill Too Soon until 12/12/2022  Was previous dose already scheduled to fill: No    Notes to Pharmacist: Will re-test on 05/02

## 2022-11-25 NOTE — Unmapped (Addendum)
Patient completed MRCP yesterday. Reached out to inquire how that was scheduled, since it was not indicated until late Aug. Patient reported it came about when he mistakenly came for a f/up appt with Dr.Barritt and had it rescheduled for Dr. Waynetta Sandy. At that time, he was told he needed to complete the MRCP before seeing Dr.Moon.   Also, patient's recent tac level has been below goal of 3. Per Drs.Deutsch-Link and Moon, will maintain current dosage, since EVL has been within goal. Abd MRI will be repeated in 1 year to monitor panc lesions.

## 2022-11-25 NOTE — Unmapped (Signed)
[facility] CLINIC PHARMACY NOTE  Matthew Key  [id number]      The patient reports they are physically located in [place] and is currently: at home. I conducted a audio/video visit. I spent  0s on the video call with the patient. I spent an additional 10 minutes on pre- and post-visit activities on the date of service .        Recommendations:   Restart statin (atorvastatin previously help in setting of elevated LFTs). Consider rosuvastatin.  Consider increasing Ozempic to 1 mg weekly, to allow for discontinuation of Novolog sliding scale. Encourage follow-up with [facility].  Repeat A1c%  Check UA    Education/Adherence tools provided today:  - Provided additional education on immunosuppression and transplant related medications including reviewing indications of medications, dosing and side effects    Follow up items:  Goal of understanding indications and dosing of immunosuppression medications  BP  A1c  Diarrhea  Potassium     Next visit with pharmacy in 1-3 months  ____________________________________________________________________    Matthew Key is a 62 y.o. 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     Immunologic Risk: first transplant    Induction Agent : basiliximab    Donor Factors: DBD    Other PMH significant for Hypertension and Stroke, T2DM, OSA on CPAP    Post op course uncomplicated  8/8-8/9 hospitalized due to elevated liver enzymes; He was evaluated with a liver doppler ultrasound which showed patent veins and arteries. Labs were obtained and showed improvement in LFTs.  8/10: EMG with no findings of neuropathy  8/17: Instructed to increase Tresiba from 15 to 17 units daily; start K-lowering supplement.  8/25: increase Mg to 2 tabs BID, continue daily veltassa  Admission 2/29-3/4: transaminitis, ERCP 3/1 w stent removal, stricture gone, stones and sludge swept.     Rejection History: NTD  Infection History: NTD  ___________________________________________________________________    Last seen by pharmacy ~ 1 months ago    Interval History:   3/19: started lisinopril 2.5mg  daily  3/26: switched to chlorthalidone from lisinopril due to lightheadedness, palor. Started lokelma for hyperkalemia.  4/10: Chlorthalidone stopped 2/2 rising Scr     Seen by pharmacy today for: medication management and blood glucose management and education    CC:  Patient has no complaints today    General: No issues  Neuro: No issues   CV: No issues   Resp: No issues   GI: No issues   GU: No issues   Derm: No issues  Psych: No issues.    Fluid Status:   Edema no  SOB no  Intake: 6+ bottles of water per day  Output: good, per patient he wakes up to urinate 3 - 4x per night    There were no vitals filed for this visit.    ___________________________________________________________________    Allergies   Allergen Reactions    Venom-Honey Bee Swelling       Medications reviewed in EPIC medication station and updated today by the clinical pharmacist practitioner.    Current Outpatient Medications   Medication Instructions    acetaminophen (TYLENOL) 325-650 mg, Oral, Every 6 hours PRN    amlodipine (NORVASC) 10 mg, Oral, Daily (standard)    aspirin (ECOTRIN) 81 mg, Oral, Daily (standard)    blood sugar diagnostic (ACCU-CHEK GUIDE TEST STRIPS) Strp Use to check blood sugar as directed with insulin 3 times a day & for symptoms of  high or low blood sugar    blood-glucose meter kit Use as instructed    blood-glucose meter Misc Use to test blood sugar as directed    DULoxetine (CYMBALTA) 60 mg, Oral, Daily (standard)    everolimus (immunosuppressive) (ZORTRESS) 2 mg, Oral, 2 times a day (standard)    gabapentin (NEURONTIN) 300 mg, Oral, 3 times a day (standard)    glucagon spray 3 mg/actuation Spry Use 1 spray into the left nostril every fifteen (15) minutes as needed (low blood sugar).    hydrOXYzine (VISTARIL) 50 mg, Oral, Nightly PRN    insulin aspart (NOVOLOG FLEXPEN) 100 unit/mL (3 mL) injection pen Inject under the skin per sliding scale prior to meals. If premeal BG 151-200 take 2 additional units for correction, if 201-250 take 4 additional units, etc. Max dose 36 units per day.  Store in-use prefilled pens at room temperature <86??F and use within 28 days; do not refrigerate.    lancets (ACCU-CHEK SOFTCLIX LANCETS) Misc Use to check blood sugar as directed with insulin 3 times a day & for symptoms of high or low blood sugar.    loperamide (IMODIUM) 2 mg capsule Take 1 capsule by mouth three times a day, end after 12/15, then take 1 capsule daily as needed for loose stools    melatonin 3 mg, Oral, Every evening    methocarbamol (ROBAXIN) 500 mg, Oral, 3 times a day PRN    OZEMPIC 0.5 mg, Subcutaneous, Every 7 days    pantoprazole (PROTONIX) 40 mg, Oral, Daily PRN    pen needle, diabetic (BD ULTRA-FINE NANO PEN NEEDLE) 32 gauge x 5/32 (4 mm) Ndle Use as directed  1-2 times per day    psyllium (METAMUCIL) 3.4 gram packet 1 packet, Oral, 2 times a day (standard)    tacrolimus (PROGRAF) 1 MG capsule Take Two caps (2mg ) in AM and Take One cap (1mg ) at PM    TRESIBA FLEXTOUCH U-100 15 Units, Subcutaneous, Daily (standard), Adjust as instructed.    ursodiol (ACTIGALL) 300 mg, Oral, 3 times a day (standard)       GRAFT FUNCTION: stable    Lab Results   Component Value Date    AST 25 11/18/2022    ALT 34 11/18/2022    Total Bilirubin 0.3 11/18/2022      Zero hour biopsy: mild reperfusion injury on donor graft. No malignancy on explanted liver.  Biopsies to date: 12/5 - presence of kupffer cells suggesting recent/resolving hepatocellular injury     Renal Function:  recently elevated in setting of chlorthalidone.  Baseline ~1.4 - 1.5    Lab Results   Component Value Date    Creatinine 2.05 (H) 11/18/2022    Creatinine 1.78 (H) 11/12/2022    Creatinine 1.43 (H) 11/05/2022    Creatinine 1.40 (H) 10/29/2022    Creatinine 1.55 (H) 10/22/2022       Proteinuria/UPC: Yes: last check Jan 2024.  Lab Results   Component Value Date    Protein/Creatinine Ratio, Urine 0.432 08/16/2022    Protein/Creatinine Ratio, Urine 0.156 04/08/2022       CURRENT IMMUNOSUPPRESSION:    Tacrolimus (Prograf) 1 mg every morning + 2 mg every evening    Tacrolimus Goal: ~3    Everolimus 4 mg BID  Everolimus goal: 3-8    Switched from MMF to allow for decreased tac goal and preserve renal function    IMMUNOSUPPRESSION DRUG LEVELS:  Lab Results   Component Value Date    Tacrolimus, Trough 2.8 (L) 11/05/2022  Tacrolimus, Trough 3.6 (L) 10/10/2022    Tacrolimus, Trough 4.8 (L) 08/16/2022    Tacrolimus Lvl 4.9 11/18/2022    Tacrolimus Lvl 7.4 11/12/2022    Tacrolimus Lvl 4.8 10/29/2022     Lab Results   Component Value Date    Everolimus Level 5.5 11/18/2022    Everolimus Level 6.8 11/12/2022    Everolimus Level 2.4 (L) 11/05/2022       Everolimus and Prograf level is accurate 12 hour trough     WBC/ANC:  wnl  Lab Results   Component Value Date    WBC 6.7 11/18/2022       Plan: No change    OI Prophylaxis:   CMV Status: D+/ R+, moderate risk .   Ppx complete     CVA history: asa 81 mg   The ASCVD Risk score (Arnett DK, et al., 2019) failed to calculate.  No results found for: LIPID    Statin therapy: Indicated; none (atorvastatin held for elevated LFTs)  Plan: Continue to monitor       BP: Goal < 140/90.   Home BP ranges: 130 - 150s/70s    Current meds include: amlodipine 10 mg daily  Past meds include: lisinopril 2.5mg  daily (stopped due to lightheadedness), chlorthalidone (stopped 1 week ago for elevated Scr)  Plan: mostly within goal. Continue to monitor    Anemia of CKD:  H/H:   Lab Results   Component Value Date    HGB 13.4 11/18/2022     Lab Results   Component Value Date    HCT 41.9 11/18/2022     Iron panel:  Lab Results   Component Value Date    IRON 85 03/11/2022    TIBC 279 03/11/2022    FERRITIN 250.9 03/11/2022     Lab Results   Component Value Date    Iron Saturation (%) 30 03/11/2022     Prior ESA use: none post transplnat  Plan: stable. No change.  Continue to monitor.     DM:   Lab Results   Component Value Date    A1C 7.5 (H) 08/16/2022   . Goal A1c < 7  A1c increasing  History of Dm? Yes: T2DM  Established with endocrinologist/PCP for BG managment? Yes: Dr. Melrose Nakayama with [facility]    Currently on: Tresiba 15 units daily, Novolog SSI 2: 50 > 150 mg/dl; Ozempic 0.5mg  weekly   Patient denies any episodes of hypoglycemia since last clinic visit  Current using ~2-4 units of SSI, usually in evening    Plan: No change. Repeat A1c at next visit as able. Continue to monitor.       Electrolytes: wnl.  Lab Results   Component Value Date    Potassium 4.9 11/18/2022    Potassium, Bld 4.2 02/06/2022    Sodium 137 11/18/2022    Sodium Whole Blood 141 02/06/2022    Magnesium 2.3 11/18/2022    CO2 25 11/18/2022     Meds currently on: none  Plan: Continue to monitor    GI/BM: pt reports infrequent  normal BM  Meds currently on: pantoprazole 40 mg daily PRN  Plan: Continue to monitor    Pain: pt reports moderate pain despite use of below regimen    Meds currently on: APAP PRN, gabapentin 300 mg TID, methocarbamol 500 mg TID prn (using TID most days), Cymbalta 60 mg daily  Plan: Continue to monitor      Bone health:   Vitamin D Level: last level is 43.1 .  Goal > 30.   Lab Results   Component Value Date    Vitamin D Total (25OH) 43.1 05/06/2022       Lab Results   Component Value Date    Calcium 9.7 11/18/2022    Calcium 9.8 11/12/2022     Last DEXA results:  01/2020: osteopenia (spine T score -1.1, femoral neck -1.7)  Current meds include: none  Plan: Vitamin D level  within goal. Consider adding Vitamin D and calcium given osteopenia if D is low. Continue to monitor.     Anxiety:  Meds currently on: hydroxyzine 50 HS PRN (rarely needs)    Women's/Men's Health:  Matthew Key is a 62 y.o. male. Patient reports no men's/women's health issues  Plan: Continue to monitor    Immunizations:  Influenza [Annual]: Received 06/2021, 05/06/22    PCV13: Received 11/2017  PPSV23: Received 03/2020  PCV20: Received 08/2021    Shingrix Zoster [2 doses, 2 - 6 months apart]: Received 05/2020, 07/2020    COVID-19 [3 primary doses, 2 boosters]: 1st dose given 10/2019, 2nd dose given 10/2019, 3rd dose given 05/2020, and Booster given 08/2021 (bivalent), 08/16/22    Pharmacy preference:  [facility]  Medication Refills:  N/a  Medication Access:  N/a    Adherence: Patient has excellent understanding of medications; was able to independently identify names/doses of immunosuppressants and OI meds.  Patient  does not fill their own pill box on a regular basis at home. States he has stopped using   Patient brought medication card:no  Pill box: n/a  Plan: Provided basic adherence counseling/intervention    Patient was reviewed with  Dr. Waynetta Sandy :     During this visit, the following was completed:   BG log data assessment  BP log data assessment  Labs ordered and evaluated  complex treatment plan >1 DS   I spent a total of 30 minutes on the phone with the patient delivering clinical care and providing education/counseling.    All questions/concerns were addressed to the patient's satisfaction.  __________________________________________  Olivia Mackie, CPP  Solid Organ Transplant Clinical Pharmacist Practitioner

## 2022-11-26 LAB — CBC W/ DIFFERENTIAL
BANDED NEUTROPHILS ABSOLUTE COUNT: 0 10*3/uL (ref 0.0–0.1)
BASOPHILS ABSOLUTE COUNT: 0 10*3/uL (ref 0.0–0.2)
BASOPHILS RELATIVE PERCENT: 0 %
EOSINOPHILS ABSOLUTE COUNT: 0.1 10*3/uL (ref 0.0–0.4)
EOSINOPHILS RELATIVE PERCENT: 2 %
HEMATOCRIT: 38.7 % (ref 37.5–51.0)
HEMOGLOBIN: 12.8 g/dL — ABNORMAL LOW (ref 13.0–17.7)
IMMATURE GRANULOCYTES: 1 %
LYMPHOCYTES ABSOLUTE COUNT: 1.6 10*3/uL (ref 0.7–3.1)
LYMPHOCYTES RELATIVE PERCENT: 25 %
MEAN CORPUSCULAR HEMOGLOBIN CONC: 33.1 g/dL (ref 31.5–35.7)
MEAN CORPUSCULAR HEMOGLOBIN: 25.2 pg — ABNORMAL LOW (ref 26.6–33.0)
MEAN CORPUSCULAR VOLUME: 76 fL — ABNORMAL LOW (ref 79–97)
MONOCYTES ABSOLUTE COUNT: 0.5 10*3/uL (ref 0.1–0.9)
MONOCYTES RELATIVE PERCENT: 8 %
NEUTROPHILS ABSOLUTE COUNT: 3.9 10*3/uL (ref 1.4–7.0)
NEUTROPHILS RELATIVE PERCENT: 64 %
PLATELET COUNT: 179 10*3/uL (ref 150–450)
RED BLOOD CELL COUNT: 5.07 x10E6/uL (ref 4.14–5.80)
RED CELL DISTRIBUTION WIDTH: 13.8 % (ref 11.6–15.4)
WHITE BLOOD CELL COUNT: 6.1 10*3/uL (ref 3.4–10.8)

## 2022-11-26 LAB — COMPREHENSIVE METABOLIC PANEL
A/G RATIO: 1.6 (ref 1.2–2.2)
ALBUMIN: 4 g/dL (ref 3.9–4.9)
ALKALINE PHOSPHATASE: 244 IU/L — ABNORMAL HIGH (ref 44–121)
ALT (SGPT): 30 IU/L (ref 0–44)
AST (SGOT): 22 IU/L (ref 0–40)
BILIRUBIN TOTAL (MG/DL) IN SER/PLAS: 0.2 mg/dL (ref 0.0–1.2)
BLOOD UREA NITROGEN: 20 mg/dL (ref 8–27)
BUN / CREAT RATIO: 12 (ref 10–24)
CALCIUM: 9.6 mg/dL (ref 8.6–10.2)
CHLORIDE: 104 mmol/L (ref 96–106)
CO2: 25 mmol/L (ref 20–29)
CREATININE: 1.71 mg/dL — ABNORMAL HIGH (ref 0.76–1.27)
GLOBULIN, TOTAL: 2.5 g/dL (ref 1.5–4.5)
GLUCOSE: 128 mg/dL — ABNORMAL HIGH (ref 70–99)
POTASSIUM: 5.1 mmol/L (ref 3.5–5.2)
SODIUM: 143 mmol/L (ref 134–144)
TOTAL PROTEIN: 6.5 g/dL (ref 6.0–8.5)

## 2022-11-26 LAB — MAGNESIUM: MAGNESIUM: 1.8 mg/dL (ref 1.6–2.3)

## 2022-11-26 LAB — BILIRUBIN, DIRECT: BILIRUBIN DIRECT: <0.1 mg/dL (ref 0.00–0.40)

## 2022-11-26 LAB — GAMMA GT: GAMMA GLUTAMYL TRANSFERASE: 26 IU/L (ref 0–65)

## 2022-11-26 LAB — PHOSPHORUS: PHOSPHORUS, SERUM: 4.1 mg/dL (ref 2.8–4.1)

## 2022-11-27 LAB — TACROLIMUS LEVEL: TACROLIMUS BLOOD: 3.4 ng/mL (ref 2.0–20.0)

## 2022-11-27 LAB — EVEROLIMUS: EVEROLIMUS LEVEL: 5.6 ng/mL (ref 3.0–8.0)

## 2022-11-29 ENCOUNTER — Ambulatory Visit: Admit: 2022-11-29 | Discharge: 2022-11-30 | Payer: MEDICARE

## 2022-11-29 DIAGNOSIS — K458 Other specified abdominal hernia without obstruction or gangrene: Principal | ICD-10-CM

## 2022-11-29 DIAGNOSIS — I10 Essential (primary) hypertension: Principal | ICD-10-CM

## 2022-11-29 DIAGNOSIS — Z944 Liver transplant status: Principal | ICD-10-CM

## 2022-11-29 DIAGNOSIS — R350 Frequency of micturition: Principal | ICD-10-CM

## 2022-11-29 LAB — URINALYSIS WITH MICROSCOPY
BACTERIA: NONE SEEN /HPF
BILIRUBIN UA: NEGATIVE
BLOOD UA: NEGATIVE
KETONES UA: NEGATIVE
LEUKOCYTE ESTERASE UA: NEGATIVE
NITRITE UA: NEGATIVE
PH UA: 5.5 (ref 5.0–9.0)
PROTEIN UA: 30 — AB
RBC UA: 2 /HPF (ref <=3)
SPECIFIC GRAVITY UA: 1.014 (ref 1.003–1.030)
SQUAMOUS EPITHELIAL: <1 /HPF (ref 0–5)
UROBILINOGEN UA: <2
WBC UA: 1 /HPF (ref <=2)

## 2022-11-29 LAB — COMPREHENSIVE METABOLIC PANEL
ALBUMIN: 3.8 g/dL (ref 3.4–5.0)
ALKALINE PHOSPHATASE: 260 U/L — ABNORMAL HIGH (ref 46–116)
ALT (SGPT): 33 U/L (ref 10–49)
ANION GAP: 6 mmol/L (ref 5–14)
AST (SGOT): 26 U/L (ref <=34)
BILIRUBIN TOTAL: 0.4 mg/dL (ref 0.3–1.2)
BLOOD UREA NITROGEN: 24 mg/dL — ABNORMAL HIGH (ref 9–23)
BUN / CREAT RATIO: 17
CALCIUM: 9.9 mg/dL (ref 8.7–10.4)
CHLORIDE: 104 mmol/L (ref 98–107)
CO2: 28 mmol/L (ref 20.0–31.0)
CREATININE: 1.4 mg/dL — ABNORMAL HIGH
EGFR CKD-EPI (2021) MALE: 57 mL/min/{1.73_m2} — ABNORMAL LOW (ref >=60)
GLUCOSE RANDOM: 164 mg/dL (ref 70–179)
POTASSIUM: 4.1 mmol/L (ref 3.4–4.8)
PROTEIN TOTAL: 7.7 g/dL (ref 5.7–8.2)
SODIUM: 138 mmol/L (ref 135–145)

## 2022-11-29 LAB — EVEROLIMUS: EVEROLIMUS LEVEL: 4.8 ng/mL (ref 3.0–15.0)

## 2022-11-29 LAB — TACROLIMUS LEVEL, TROUGH: TACROLIMUS, TROUGH: 2.5 ng/mL — ABNORMAL LOW (ref 5.0–15.0)

## 2022-11-29 LAB — CBC
HEMATOCRIT: 41.1 % (ref 39.0–48.0)
HEMOGLOBIN: 13.7 g/dL (ref 12.9–16.5)
MEAN CORPUSCULAR HEMOGLOBIN CONC: 33.3 g/dL (ref 32.0–36.0)
MEAN CORPUSCULAR HEMOGLOBIN: 25.3 pg — ABNORMAL LOW (ref 25.9–32.4)
MEAN CORPUSCULAR VOLUME: 76.1 fL — ABNORMAL LOW (ref 77.6–95.7)
MEAN PLATELET VOLUME: 9.4 fL (ref 6.8–10.7)
PLATELET COUNT: 185 10*9/L (ref 150–450)
RED BLOOD CELL COUNT: 5.4 10*12/L (ref 4.26–5.60)
RED CELL DISTRIBUTION WIDTH: 15 % (ref 12.2–15.2)
WBC ADJUSTED: 7.4 10*9/L (ref 3.6–11.2)

## 2022-11-29 MED ORDER — CARVEDILOL 6.25 MG TABLET
ORAL_TABLET | Freq: Two times a day (BID) | ORAL | 3 refills | 90 days | Status: CP
Start: 2022-11-29 — End: 2023-11-29

## 2022-11-29 NOTE — Unmapped (Addendum)
1) We referred you to the hernia center, to see Dr. Carlynn Purl. They should call you to schedule an appointment.    2) Add two more walks in to your daily routine in addition to your normal activity, you can start with 10 minutes each and work your way up to 20 minutes each. This will help both your blood pressure and can help the stomach gurgling/gas you have had by improving your intestinal motility.     3) For your blood pressure, we recommend starting carvedilol 6.25mg  twice a day. Keep logging your blood pressure, we'll have your coordinator get in touch in two weeks to see if we need to go up.     4) STOP taking ursodiol    Tawni Carnes, MD MPH  [facility]  Assistant Professor  Division of Gastroenterology and Hepatology      How to reach me:    MyChart Message to me or nurse Ted Mcalpine: for non-emergency questions; may take up to 48-72 hours to respond    Fax GI clinic: [phone]  Fax Multidisciplinary [facility] Clinic: [phone]      Important Contact Numbers:    GI Clinic Appointments:? [phone]  Please call the GI Clinic appointment line if you need to schedule, reschedule or cancel an appointment in clinic. They can also answer any questions you may have about where your appointment is located and when you need to arrive.    GI Procedure Appointments:?[phone]  Please call the GI Procedures line if you need to schedule, reschedule or cancel ANY type of procedure (EGD, colonoscopy, motility testing, etc). You can also call this number for prep instructions, etc.?    Radiology: [phone], option 3 or 4  If you are being scheduled for any type of radiology, you will need to call to receive your appointment time.? Please call this number for information.?    For emergencies after normal business hours or on weekends/holidays  Proceed to the nearest emergency room OR contact the [facility] Operator at [phone] who can page the Gastroenterology Fellow on call.    Financial Assistance: [phone] or email [email or web address]      Pharmacy Assistance: [phone]

## 2022-11-29 NOTE — Unmapped (Signed)
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 Everolimus has been started in s/o diabetes to minimize tacrolimus burden. 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  - Stop ursodiol   - Transplant labs today     Immunosuppresion: On combo tac/everolimus due to DM to minimiz tac burden.   - Tac goal: 3-5, currently on tac 2/1 (goal to keep level closer to 3)  - Everolimus goal: 3-8, currently on 2/2    Abdominal Hernia: Has abdominal hernia that has been present since after his transplant, but reports he thinks it has worsened/gotten more noticeable.   - Referral to hernia center  - May have to consider periop management of everolimus given this can impact wound healing, will address if surgery is deemed appropriate.     Hypertension: Tried lisinopril, stopped due to side effects, tried chlorthalidone, stopped due to rising Cr.   - Continue amlodipine 10mg  daily  - Discussed low Na diet, cannot do full DASH diet due to CKD and elevated K  - Recommended increasing physical activity  - Start carvedilol 6.25 BID, will have coordinator reach out in 2 weeks to see if we need to have dose adjustment. Patient will log BP at home.     CKD: Elevated Cr in s/o longstanding HTN and CNI exposure, now on everolimus with plan to minimize CNI exposure as much as possible. Have had trouble treating HTN due to worsening Cr with multiple agents, and inability to tolerate lisinopril due to side effects.  - Could consider ARB in future  - Management of IS and HTN as above    Borborygmi: Patient reports borborygmi that has been present for past several months in s/o prior diarrhea, however now diarrhea resolved. Tried rifaximin for potential SIBO, which didn't improve symptoms, no pain/significant bloating. Wonder if altered motility in s/o ozempic may be contributing  - Advised increasing physical activity as above to improve intestinal motility     Pancreatic Cyst: Pancreatic cyst present on MRI 11/24/22, which were reportedly unchanged from prior however MRI from 2022 stated largest cyst was 0.7cm (now 1.4cm).   - Will review with radiology    Erectile dysfunction: longstanding issue; likely related to prior poorly controlled DM; cannot tolerate viagra or any similar PDE5 inhibitor medications due to significant headache  - Discuss further with PCP and consider urology referral    #Preventive Hepatology Care:  - Colonoscopy: UTD, next due 2033  - Flu shot: UTD  - COVID: UTD  - Recommend dermatology evaluation annually due to risk of skin cancers with IS    No follow-ups on file.    Subjective   History of Present Illness   Accompanied by: N/A (unaccompanied)    7M w/hx of CVA, OSA on CPAP, Type II DM, HTN, HLD, remote history of Boerhaves syndrome s/p repair, cryptogenic/MASH cirrhosis s/p OLT  01/2022 who presents for follow-up.    Last seen by Dr. Waynetta Sandy 08/16/22. Had restarted his ozempic. He had persistently elevated liver enzymes presumably secondary to anastomotic stricture.     He was admitted 2/29-10/14/22 with elevated liver enzymes. ERCP revealed occluded stents and underwent removal (not replaced). MRI follow up     Most recent labs 11/25/22 demonstrated AST 22, ALT 30, ALP 244 (stably elevated), Cr 1.71 (stable). Tac 3.4, everolimus 5.6 (all within goal)    No fevers, chills, nausea/vomiting. Reports abdominal hernia has worsened recently. Endorses abdominal gurgling.     BP is elevated today, 170/81. Has been elevated on a number of occasions. He takes amlodipine 10mg  daily.    Objective   Physical Exam   Vital Signs: BP 170/81  - Pulse 66  - Temp 36.6 ??C (97.8 ??F) (Oral)  - Resp 18  - Ht 172.7 cm (5' 8)  - Wt 88.3 kg (194 lb 9.6 oz)  - SpO2 99%  - BMI 29.59 kg/m??   Constitutional: He is in no apparent distress  Eyes: Anicteric sclerae  Cardiovascular: No peripheral edema  Gastrointestinal: Soft, nontender abdomen without hepatosplenomegaly. Abdominal hernia present in LUQ, soft and easily reducible, exacerbated by standing/valsava. Normal bowel sounds.   Neurologic: Awake, alert, and oriented to person, place, and time with normal speech and no asterixis    Most recent labs 11/25/22 demonstrated AST 22, ALT 30, ALP 244 (stably elevated), Cr 1.71 (downtrending, but up from overall baseline still). Tac 3.4, everolimus 5.6 (all within goal)    Patient is taking immunosuppressive medications due to liver transplantation and requires monitoring of renal function for signs of toxicity

## 2022-12-02 NOTE — Unmapped (Signed)
Spoke w/radiology regarding MRI/MRCP. His 2022 MRI was done pre transplant and had significant ascites and therefore difficult to compare to 07/2022 or 11/2022 MRI, but based on what we can see largest cyst in 07/2022 is larger, however difficult to compare. 07/2022-->11/2022, cyst has not enlarged/is stable.    Based on this, we will repeat MRI/MRCP to monitor cystic lesion in 1 year (11/2023).    Discussed w/Laura Cramer and Dr. Waynetta Sandy

## 2022-12-04 LAB — CBC W/ DIFFERENTIAL
BANDED NEUTROPHILS ABSOLUTE COUNT: 0 10*3/uL (ref 0.0–0.1)
BASOPHILS ABSOLUTE COUNT: 0 10*3/uL (ref 0.0–0.2)
BASOPHILS RELATIVE PERCENT: 0 %
EOSINOPHILS ABSOLUTE COUNT: 0.1 10*3/uL (ref 0.0–0.4)
EOSINOPHILS RELATIVE PERCENT: 2 %
HEMATOCRIT: 38.2 % (ref 37.5–51.0)
HEMOGLOBIN: 12.4 g/dL — ABNORMAL LOW (ref 13.0–17.7)
IMMATURE GRANULOCYTES: 0 %
LYMPHOCYTES ABSOLUTE COUNT: 1.4 10*3/uL (ref 0.7–3.1)
LYMPHOCYTES RELATIVE PERCENT: 22 %
MEAN CORPUSCULAR HEMOGLOBIN CONC: 32.5 g/dL (ref 31.5–35.7)
MEAN CORPUSCULAR HEMOGLOBIN: 25.9 pg — ABNORMAL LOW (ref 26.6–33.0)
MEAN CORPUSCULAR VOLUME: 80 fL (ref 79–97)
MONOCYTES ABSOLUTE COUNT: 0.6 10*3/uL (ref 0.1–0.9)
MONOCYTES RELATIVE PERCENT: 9 %
NEUTROPHILS ABSOLUTE COUNT: 4 10*3/uL (ref 1.4–7.0)
NEUTROPHILS RELATIVE PERCENT: 67 %
PLATELET COUNT: 171 10*3/uL (ref 150–450)
RED BLOOD CELL COUNT: 4.78 x10E6/uL (ref 4.14–5.80)
RED CELL DISTRIBUTION WIDTH: 14.2 % (ref 11.6–15.4)
WHITE BLOOD CELL COUNT: 6.1 10*3/uL (ref 3.4–10.8)

## 2022-12-04 LAB — GAMMA GT: GAMMA GLUTAMYL TRANSFERASE: 21 IU/L (ref 0–65)

## 2022-12-04 LAB — BILIRUBIN, DIRECT: BILIRUBIN DIRECT: <0.1 mg/dL (ref 0.00–0.40)

## 2022-12-04 LAB — COMPREHENSIVE METABOLIC PANEL
A/G RATIO: 1.7 (ref 1.2–2.2)
ALBUMIN: 3.8 g/dL — ABNORMAL LOW (ref 3.9–4.9)
ALKALINE PHOSPHATASE: 224 IU/L — ABNORMAL HIGH (ref 44–121)
ALT (SGPT): 28 IU/L (ref 0–44)
AST (SGOT): 24 IU/L (ref 0–40)
BILIRUBIN TOTAL (MG/DL) IN SER/PLAS: <0.2 mg/dL (ref 0.0–1.2)
BLOOD UREA NITROGEN: 20 mg/dL (ref 8–27)
BUN / CREAT RATIO: 11 (ref 10–24)
CALCIUM: 9.4 mg/dL (ref 8.6–10.2)
CHLORIDE: 105 mmol/L (ref 96–106)
CO2: 25 mmol/L (ref 20–29)
CREATININE: 1.85 mg/dL — ABNORMAL HIGH (ref 0.76–1.27)
GLOBULIN, TOTAL: 2.3 g/dL (ref 1.5–4.5)
GLUCOSE: 118 mg/dL — ABNORMAL HIGH (ref 70–99)
POTASSIUM: 5.5 mmol/L — ABNORMAL HIGH (ref 3.5–5.2)
SODIUM: 143 mmol/L (ref 134–144)
TOTAL PROTEIN: 6.1 g/dL (ref 6.0–8.5)

## 2022-12-04 LAB — PHOSPHORUS: PHOSPHORUS, SERUM: 4.1 mg/dL (ref 2.8–4.1)

## 2022-12-04 LAB — MAGNESIUM: MAGNESIUM: 2 mg/dL (ref 1.6–2.3)

## 2022-12-05 DIAGNOSIS — E875 Hyperkalemia: Principal | ICD-10-CM

## 2022-12-05 LAB — TACROLIMUS LEVEL: TACROLIMUS BLOOD: 5.3 ng/mL (ref 2.0–20.0)

## 2022-12-05 LAB — EVEROLIMUS: EVEROLIMUS LEVEL: 5.6 ng/mL (ref 3.0–8.0)

## 2022-12-05 NOTE — Unmapped (Signed)
Patient's 4/23 Tac level above goal and K/Cr elevated. Discussed with PharmD Chargualaf, who recommended patient take a dose of Veltassa today but make no changes to his Tac, since it has trended previously around the recommended goal of 3.     Spoke with patient and encouraged him to continue monitoring his dietary K intake, maintain good hydration and to take a dose of his inventory of Veltassa. He verbalized understanding.

## 2022-12-09 NOTE — Unmapped (Signed)
[facility] Refill Coordination Note    Specialty Medication(s) to be Shipped:   Transplant: tacrolimus 1mg  and Zortress 0.5mg     Other medication(s) to be shipped:   Ozempic  Gabapentin  Pen needles     Matthew Key, DOB: 1960/10/17  Phone: [phone] (home) [phone] (work)      All above HIPAA information was verified with patient.     Was a Nurse, learning disability used for this call? No    Completed refill call assessment today to schedule patient's medication shipment from the [facility] [phone]).  All relevant notes have been reviewed.     Specialty medication(s) and dose(s) confirmed: Regimen is correct and unchanged.   Changes to medications: Avan reports no changes at this time.  Changes to insurance: No  New side effects reported not previously addressed with a pharmacist or physician: None reported  Questions for the pharmacist: No    Confirmed patient received a Conservation officer, historic buildings and a Surveyor, mining with first shipment. The patient will receive a drug information handout for each medication shipped and additional FDA Medication Guides as required.       DISEASE/MEDICATION-SPECIFIC INFORMATION        N/A    SPECIALTY MEDICATION ADHERENCE     Medication Adherence    Patient reported X missed doses in the last month: 0  Specialty Medication: everolimus (immunosuppressive) 0.5 mg tablet (ZORTRESS)  Patient is on additional specialty medications: Yes  Additional Specialty Medications: tacrolimus 1 MG capsule (PROGRAF)  Patient Reported Additional Medication X Missed Doses in the Last Month: 0  Patient is on more than two specialty medications: No              Were doses missed due to medication being on hold? No    Zortress 0.5 mg: 10 days of medicine on hand   tacrolimus 1 mg: 10 days of medicine on hand     REFERRAL TO PHARMACIST     Referral to the pharmacist: Not needed      [facility]     Shipping address confirmed in Epic.     Patient was notified of new phone menu : No    Delivery Scheduled: Yes, Expected medication delivery date: 12/13/22.     Medication will be delivered via UPS to the prescription address in Epic WAM.    Matthew Key   [facility] Specialty Technician

## 2022-12-10 DIAGNOSIS — Z944 Liver transplant status: Principal | ICD-10-CM

## 2022-12-10 DIAGNOSIS — K862 Cyst of pancreas: Principal | ICD-10-CM

## 2022-12-10 NOTE — Unmapped (Signed)
Patient completed Abd MRI/MRCP on 4/14, which was reviewed by Dr.Deutsch-Link. She communicated with team that she spoke with abdominal MRI. Comparing his MRI abdomen 2022 and his current one, it does appear that these cystic lesions are growing, but citing that it was difficulty to compare since he no longer has ascites. She recommended, based on this, to be certain, guidelines would be to repeat an MRI/MRCP in 1 year (April 2025).  Shared information with patient, who verbalized understanding.

## 2022-12-11 LAB — CBC W/ DIFFERENTIAL
BANDED NEUTROPHILS ABSOLUTE COUNT: 0 10*3/uL (ref 0.0–0.1)
BASOPHILS ABSOLUTE COUNT: 0 10*3/uL (ref 0.0–0.2)
BASOPHILS RELATIVE PERCENT: 0 %
EOSINOPHILS ABSOLUTE COUNT: 0.2 10*3/uL (ref 0.0–0.4)
EOSINOPHILS RELATIVE PERCENT: 3 %
HEMATOCRIT: 39.2 % (ref 37.5–51.0)
HEMOGLOBIN: 12.7 g/dL — ABNORMAL LOW (ref 13.0–17.7)
IMMATURE GRANULOCYTES: 0 %
LYMPHOCYTES ABSOLUTE COUNT: 1.1 10*3/uL (ref 0.7–3.1)
LYMPHOCYTES RELATIVE PERCENT: 21 %
MEAN CORPUSCULAR HEMOGLOBIN CONC: 32.4 g/dL (ref 31.5–35.7)
MEAN CORPUSCULAR HEMOGLOBIN: 25.7 pg — ABNORMAL LOW (ref 26.6–33.0)
MEAN CORPUSCULAR VOLUME: 79 fL (ref 79–97)
MONOCYTES ABSOLUTE COUNT: 0.5 10*3/uL (ref 0.1–0.9)
MONOCYTES RELATIVE PERCENT: 10 %
NEUTROPHILS ABSOLUTE COUNT: 3.4 10*3/uL (ref 1.4–7.0)
NEUTROPHILS RELATIVE PERCENT: 66 %
PLATELET COUNT: 159 10*3/uL (ref 150–450)
RED BLOOD CELL COUNT: 4.95 x10E6/uL (ref 4.14–5.80)
RED CELL DISTRIBUTION WIDTH: 14.1 % (ref 11.6–15.4)
WHITE BLOOD CELL COUNT: 5.1 10*3/uL (ref 3.4–10.8)

## 2022-12-11 LAB — PHOSPHORUS: PHOSPHORUS, SERUM: 3.9 mg/dL (ref 2.8–4.1)

## 2022-12-11 LAB — COMPREHENSIVE METABOLIC PANEL
A/G RATIO: 1.4 (ref 1.2–2.2)
ALBUMIN: 3.7 g/dL — ABNORMAL LOW (ref 3.9–4.9)
ALKALINE PHOSPHATASE: 223 IU/L — ABNORMAL HIGH (ref 44–121)
ALT (SGPT): 38 IU/L (ref 0–44)
AST (SGOT): 30 IU/L (ref 0–40)
BILIRUBIN TOTAL (MG/DL) IN SER/PLAS: 0.3 mg/dL (ref 0.0–1.2)
BLOOD UREA NITROGEN: 20 mg/dL (ref 8–27)
BUN / CREAT RATIO: 12 (ref 10–24)
CALCIUM: 9 mg/dL (ref 8.6–10.2)
CHLORIDE: 105 mmol/L (ref 96–106)
CO2: 21 mmol/L (ref 20–29)
CREATININE: 1.66 mg/dL — ABNORMAL HIGH (ref 0.76–1.27)
GLOBULIN, TOTAL: 2.6 g/dL (ref 1.5–4.5)
GLUCOSE: 126 mg/dL — ABNORMAL HIGH (ref 70–99)
POTASSIUM: 5.2 mmol/L (ref 3.5–5.2)
SODIUM: 139 mmol/L (ref 134–144)
TOTAL PROTEIN: 6.3 g/dL (ref 6.0–8.5)

## 2022-12-11 LAB — GAMMA GT: GAMMA GLUTAMYL TRANSFERASE: 28 IU/L (ref 0–65)

## 2022-12-11 LAB — MAGNESIUM: MAGNESIUM: 2 mg/dL (ref 1.6–2.3)

## 2022-12-11 LAB — BILIRUBIN, DIRECT: BILIRUBIN DIRECT: <0.1 mg/dL (ref 0.00–0.40)

## 2022-12-12 LAB — TACROLIMUS LEVEL: TACROLIMUS BLOOD: 3.5 ng/mL (ref 2.0–20.0)

## 2022-12-12 MED FILL — OZEMPIC 0.25 MG OR 0.5 MG (2 MG/3 ML) SUBCUTANEOUS PEN INJECTOR: SUBCUTANEOUS | 28 days supply | Qty: 3 | Fill #3

## 2022-12-12 MED FILL — GABAPENTIN 300 MG CAPSULE: ORAL | 30 days supply | Qty: 90 | Fill #1

## 2022-12-12 NOTE — Unmapped (Signed)
Therapy Update Follow Up: No issues - Copay = $5.91

## 2022-12-13 DIAGNOSIS — I10 Essential (primary) hypertension: Principal | ICD-10-CM

## 2022-12-13 LAB — EVEROLIMUS: EVEROLIMUS LEVEL: 5 ng/mL (ref 3.0–8.0)

## 2022-12-13 MED ORDER — CARVEDILOL 6.25 MG TABLET
ORAL_TABLET | Freq: Two times a day (BID) | ORAL | 3 refills | 90 days | Status: CP
Start: 2022-12-13 — End: 2023-12-13

## 2022-12-13 NOTE — Unmapped (Addendum)
Placed referral for hernia surgery and reached out to Dr.Perez nurse, Shanon Rosser, to inquire if patient's recent MRI would suffice for hernia eval. Per Dr.Perez, Abd/Pelvic CT (noncontrast) preferred. Updated patient and provided phone contact to schedule CT closer to his upcoming appt with them.      Discussed BP recordings since he started coreg. Patient stated his BPS have consistently been ~150/70s, no matter when he checks it during the day. Passed this info onto Dr.Deutsch-Link, who discussed with Dr.Moon and recommended patient increase his dose of coreg to 12.5mg  bid. Relayed dose change to patient with instruction to continue monitoring/recording his bps and contacting this TNC or the txp team if his SBP drops below 100 or if he develops dizziness. He verbalized understanding.

## 2022-12-14 NOTE — Unmapped (Signed)
Addended by: Genia Harold on: 12/13/2022 06:06 PM     Modules accepted: Orders

## 2022-12-18 LAB — CBC W/ DIFFERENTIAL
BANDED NEUTROPHILS ABSOLUTE COUNT: 0 10*3/uL (ref 0.0–0.1)
BASOPHILS ABSOLUTE COUNT: 0 10*3/uL (ref 0.0–0.2)
BASOPHILS RELATIVE PERCENT: 0 %
EOSINOPHILS ABSOLUTE COUNT: 0.2 10*3/uL (ref 0.0–0.4)
EOSINOPHILS RELATIVE PERCENT: 4 %
HEMATOCRIT: 38 % (ref 37.5–51.0)
HEMOGLOBIN: 12.1 g/dL — ABNORMAL LOW (ref 13.0–17.7)
IMMATURE GRANULOCYTES: 1 %
LYMPHOCYTES ABSOLUTE COUNT: 1.3 10*3/uL (ref 0.7–3.1)
LYMPHOCYTES RELATIVE PERCENT: 25 %
MEAN CORPUSCULAR HEMOGLOBIN CONC: 31.8 g/dL (ref 31.5–35.7)
MEAN CORPUSCULAR HEMOGLOBIN: 24.8 pg — ABNORMAL LOW (ref 26.6–33.0)
MEAN CORPUSCULAR VOLUME: 78 fL — ABNORMAL LOW (ref 79–97)
MONOCYTES ABSOLUTE COUNT: 0.5 10*3/uL (ref 0.1–0.9)
MONOCYTES RELATIVE PERCENT: 11 %
NEUTROPHILS ABSOLUTE COUNT: 3.1 10*3/uL (ref 1.4–7.0)
NEUTROPHILS RELATIVE PERCENT: 59 %
PLATELET COUNT: 148 10*3/uL — ABNORMAL LOW (ref 150–450)
RED BLOOD CELL COUNT: 4.87 x10E6/uL (ref 4.14–5.80)
RED CELL DISTRIBUTION WIDTH: 14.2 % (ref 11.6–15.4)
WHITE BLOOD CELL COUNT: 5.2 10*3/uL (ref 3.4–10.8)

## 2022-12-18 LAB — GAMMA GT: GAMMA GLUTAMYL TRANSFERASE: 29 IU/L (ref 0–65)

## 2022-12-18 LAB — BILIRUBIN, DIRECT: BILIRUBIN DIRECT: <0.1 mg/dL (ref 0.00–0.40)

## 2022-12-18 LAB — COMPREHENSIVE METABOLIC PANEL
A/G RATIO: 1.4 (ref 1.2–2.2)
ALBUMIN: 3.9 g/dL (ref 3.9–4.9)
ALKALINE PHOSPHATASE: 193 IU/L — ABNORMAL HIGH (ref 44–121)
ALT (SGPT): 38 IU/L (ref 0–44)
AST (SGOT): 26 IU/L (ref 0–40)
BILIRUBIN TOTAL (MG/DL) IN SER/PLAS: 0.3 mg/dL (ref 0.0–1.2)
BLOOD UREA NITROGEN: 24 mg/dL (ref 8–27)
BUN / CREAT RATIO: 15 (ref 10–24)
CALCIUM: 9.4 mg/dL (ref 8.6–10.2)
CHLORIDE: 104 mmol/L (ref 96–106)
CO2: 22 mmol/L (ref 20–29)
CREATININE: 1.62 mg/dL — ABNORMAL HIGH (ref 0.76–1.27)
GLOBULIN, TOTAL: 2.7 g/dL (ref 1.5–4.5)
GLUCOSE: 129 mg/dL — ABNORMAL HIGH (ref 70–99)
POTASSIUM: 5.2 mmol/L (ref 3.5–5.2)
SODIUM: 142 mmol/L (ref 134–144)
TOTAL PROTEIN: 6.6 g/dL (ref 6.0–8.5)

## 2022-12-18 LAB — MAGNESIUM: MAGNESIUM: 1.9 mg/dL (ref 1.6–2.3)

## 2022-12-18 LAB — PHOSPHORUS: PHOSPHORUS, SERUM: 4.2 mg/dL — ABNORMAL HIGH (ref 2.8–4.1)

## 2022-12-20 LAB — EVEROLIMUS: EVEROLIMUS LEVEL: 6.9 ng/mL (ref 3.0–8.0)

## 2022-12-20 LAB — TACROLIMUS LEVEL: TACROLIMUS BLOOD: 4.4 ng/mL (ref 2.0–20.0)

## 2022-12-24 LAB — COMPREHENSIVE METABOLIC PANEL
A/G RATIO: 1.7 (ref 1.2–2.2)
ALBUMIN: 4.2 g/dL (ref 3.9–4.9)
ALKALINE PHOSPHATASE: 214 IU/L — ABNORMAL HIGH (ref 44–121)
ALT (SGPT): 40 IU/L (ref 0–44)
AST (SGOT): 27 IU/L (ref 0–40)
BILIRUBIN TOTAL (MG/DL) IN SER/PLAS: 0.3 mg/dL (ref 0.0–1.2)
BLOOD UREA NITROGEN: 30 mg/dL — ABNORMAL HIGH (ref 8–27)
BUN / CREAT RATIO: 15 (ref 10–24)
CALCIUM: 9.6 mg/dL (ref 8.6–10.2)
CHLORIDE: 102 mmol/L (ref 96–106)
CO2: 24 mmol/L (ref 20–29)
CREATININE: 2.03 mg/dL — ABNORMAL HIGH (ref 0.76–1.27)
GLOBULIN, TOTAL: 2.5 g/dL (ref 1.5–4.5)
GLUCOSE: 132 mg/dL — ABNORMAL HIGH (ref 70–99)
POTASSIUM: 5.2 mmol/L (ref 3.5–5.2)
SODIUM: 138 mmol/L (ref 134–144)
TOTAL PROTEIN: 6.7 g/dL (ref 6.0–8.5)

## 2022-12-24 LAB — MAGNESIUM: MAGNESIUM: 2.2 mg/dL (ref 1.6–2.3)

## 2022-12-24 LAB — CBC W/ DIFFERENTIAL
BANDED NEUTROPHILS ABSOLUTE COUNT: 0 10*3/uL (ref 0.0–0.1)
BASOPHILS ABSOLUTE COUNT: 0 10*3/uL (ref 0.0–0.2)
BASOPHILS RELATIVE PERCENT: 0 %
EOSINOPHILS ABSOLUTE COUNT: 0.2 10*3/uL (ref 0.0–0.4)
EOSINOPHILS RELATIVE PERCENT: 4 %
HEMATOCRIT: 39 % (ref 37.5–51.0)
HEMOGLOBIN: 12.5 g/dL — ABNORMAL LOW (ref 13.0–17.7)
IMMATURE GRANULOCYTES: 0 %
LYMPHOCYTES ABSOLUTE COUNT: 1.2 10*3/uL (ref 0.7–3.1)
LYMPHOCYTES RELATIVE PERCENT: 22 %
MEAN CORPUSCULAR HEMOGLOBIN CONC: 32.1 g/dL (ref 31.5–35.7)
MEAN CORPUSCULAR HEMOGLOBIN: 25 pg — ABNORMAL LOW (ref 26.6–33.0)
MEAN CORPUSCULAR VOLUME: 78 fL — ABNORMAL LOW (ref 79–97)
MONOCYTES ABSOLUTE COUNT: 0.7 10*3/uL (ref 0.1–0.9)
MONOCYTES RELATIVE PERCENT: 12 %
NEUTROPHILS ABSOLUTE COUNT: 3.4 10*3/uL (ref 1.4–7.0)
NEUTROPHILS RELATIVE PERCENT: 62 %
PLATELET COUNT: 149 10*3/uL — ABNORMAL LOW (ref 150–450)
RED BLOOD CELL COUNT: 5 x10E6/uL (ref 4.14–5.80)
RED CELL DISTRIBUTION WIDTH: 14.2 % (ref 11.6–15.4)
WHITE BLOOD CELL COUNT: 5.6 10*3/uL (ref 3.4–10.8)

## 2022-12-24 LAB — GAMMA GT: GAMMA GLUTAMYL TRANSFERASE: 31 IU/L (ref 0–65)

## 2022-12-24 LAB — PHOSPHORUS: PHOSPHORUS, SERUM: 4.6 mg/dL — ABNORMAL HIGH (ref 2.8–4.1)

## 2022-12-24 LAB — BILIRUBIN, DIRECT: BILIRUBIN DIRECT: 0.11 mg/dL (ref 0.00–0.40)

## 2022-12-25 LAB — TACROLIMUS LEVEL: TACROLIMUS BLOOD: 4.5 ng/mL (ref 2.0–20.0)

## 2022-12-25 LAB — EVEROLIMUS: EVEROLIMUS LEVEL: 7.4 ng/mL (ref 3.0–8.0)

## 2022-12-25 IMAGING — DX DG CHEST 1V PORT
1 series · 1 of 1 positions shown · non-contrast
Comparison: Chest x-ray 11/11/2014 report without imaging

CLINICAL DATA: Shortness of breath.

EXAM:
PORTABLE CHEST 1 VIEW

[chest ap grid]
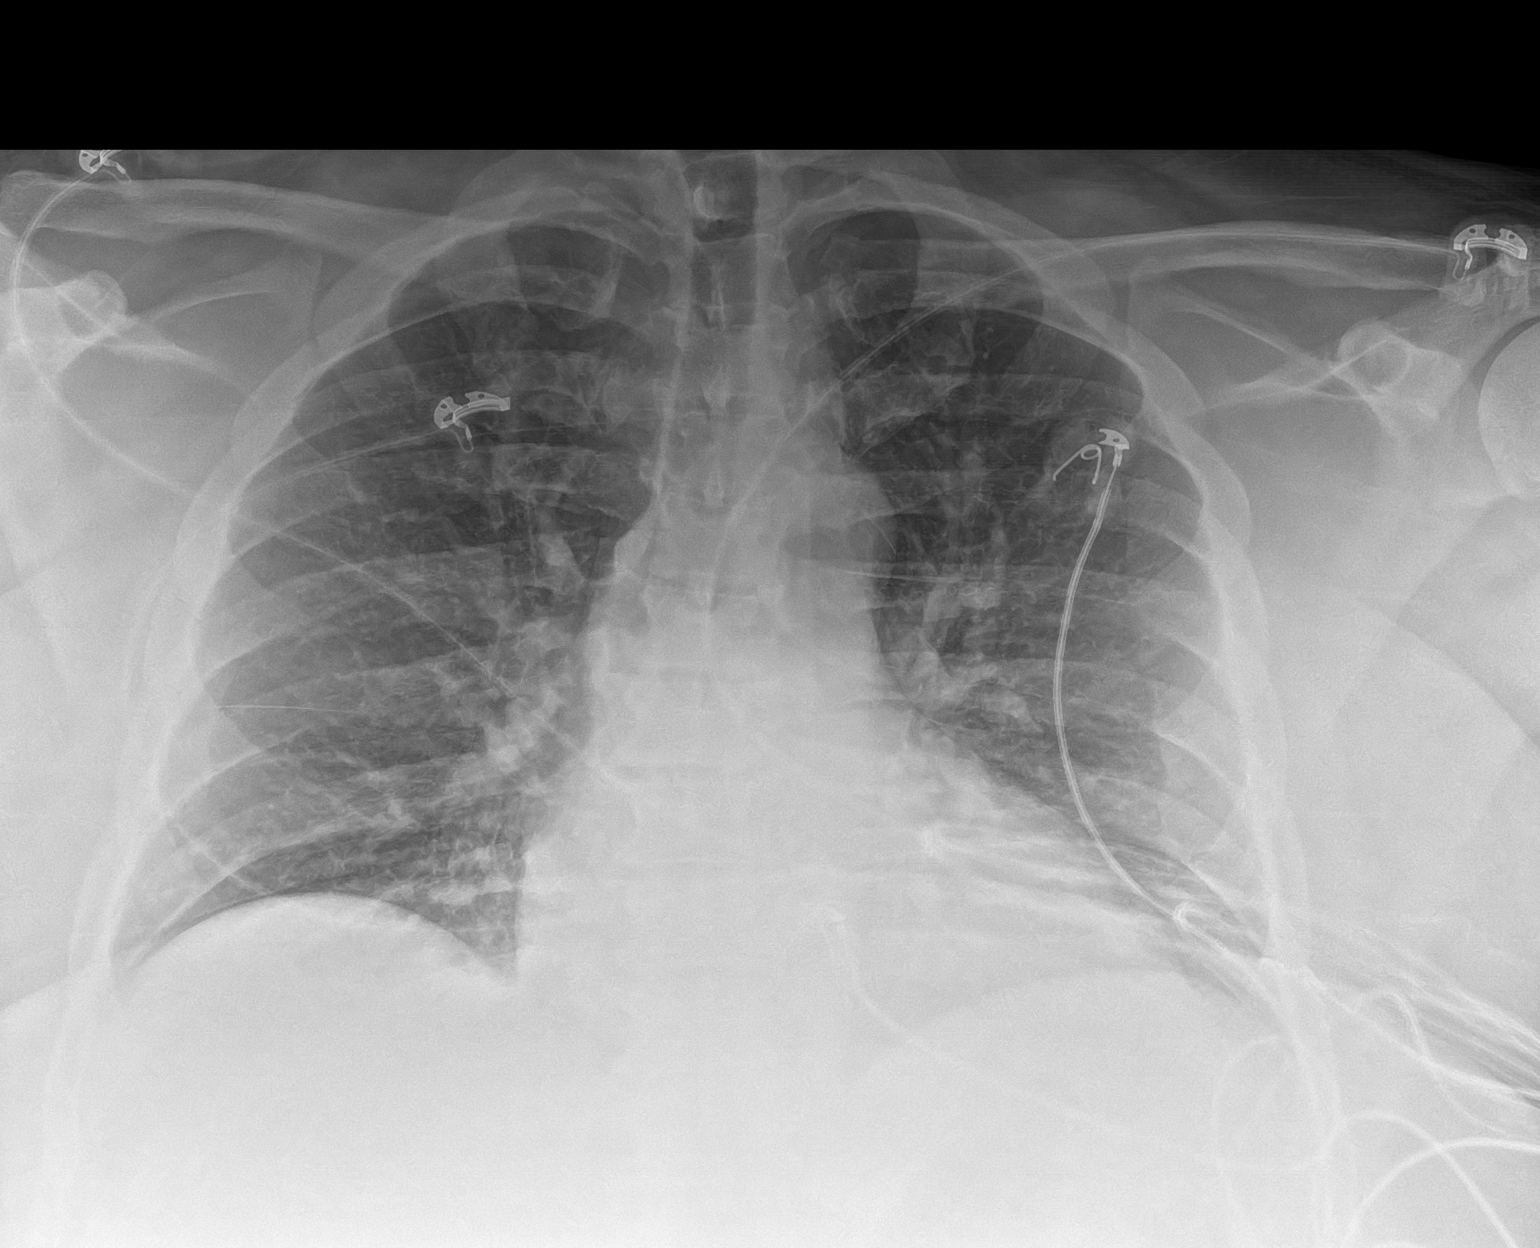

[1 of 1 positions shown; findings below may reference images not displayed]

FINDINGS: The heart size and mediastinal contours are within normal limits.

No focal consolidation. Slightly increased interstitial markings
with no overt pulmonary edema. No pleural effusion. No pneumothorax.

No acute osseous abnormality.
IMPRESSION: No active disease.

## 2022-12-26 DIAGNOSIS — Z796 Long-term use of immunosuppressant medication: Principal | ICD-10-CM

## 2022-12-26 DIAGNOSIS — Z944 Liver transplant status: Principal | ICD-10-CM

## 2022-12-26 MED ORDER — TACROLIMUS 1 MG CAPSULE, IMMEDIATE-RELEASE
ORAL_CAPSULE | Freq: Two times a day (BID) | ORAL | 11 refills | 30 days | Status: CP
Start: 2022-12-26 — End: ?
  Filled 2023-01-07: qty 60, 30d supply, fill #0

## 2022-12-26 NOTE — Unmapped (Addendum)
[facility] Pharmacist has reviewed a new prescription for tacroliumus that indicates a dose decrease.  Patient was counseled on this dosage change by [facility]- see epic note from 12/26/22.  Next refill call date adjusted if necessary.        Clinical Assessment Needed For: Dose Change  Medication: Tacrolimus 1mg  capsule  Last Fill Date/Day Supply: 12/12/2022 / 30 days  Refill Too Soon until 01/04/2023  Was previous dose already scheduled to fill: No    Notes to Pharmacist: Will re-test on 05/27

## 2022-12-26 NOTE — Unmapped (Addendum)
Patient's 5/13 Tac level in general range (3-5) but above 3, where Dr.Moon preferred. Reviewed with PharmD Christena Deem, who recommended reducing it to 1mg  bid. Per Dr.Moon, patient approved to assume an every 2 wk lab interval, as well. Spoke with patient and confirmed he had been taking the Tac as prescribed and that the lab was reliable. Relayed recommendation for new dose and lab interval. He verbalized understanding.

## 2023-01-01 DIAGNOSIS — Z944 Liver transplant status: Principal | ICD-10-CM

## 2023-01-01 DIAGNOSIS — K439 Ventral hernia without obstruction or gangrene: Principal | ICD-10-CM

## 2023-01-01 NOTE — Unmapped (Signed)
Reached out to patient to f/up on his bp log since the carvedilol was increased. He reported that he has been taking his bps, mainly in the morning before meds, and they are still in the 150s/70s, and on the occasional night that he checks it, it will be in the 140s/70s. However, he then said he was not sure if his cuff was correct, since his BP at his pcp's office in the last week was in the 130s. He denied taking the cuff there to compare with his pcps. Encouraged him to do this and to check what his bps are after he takes his coreg. Patient then shared that he planned to contact this [facility] to see if he could get his hernia appt sooner, stating that it appears to be puffier at the incision sit and causes him more frequent pain. He mentioned that he continues to take imodium ~2x weekly to control his diarrhea, saying that it becomes watery and causes GI distress/urgency. Let him know this [facility] would share this with Dr.Moon, but encouraged him to get the Abd/Pelv CT scheduled, since Dr.Perez felt that the MRI done in April was not sufficient for his hernia evaluation. He verbalized understanding of all discussed.

## 2023-01-02 NOTE — Unmapped (Signed)
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     Was a Nurse, learning disability used for this call? No    Completed refill call assessment today to schedule patient's medication shipment from the [facility] [phone]).  All relevant notes have been reviewed.     Specialty medication(s) and dose(s) confirmed: Regimen is correct and unchanged.   Changes to medications: Donyea reports no changes at this time.  Changes to insurance: No  New side effects reported not previously addressed with a pharmacist or physician: None reported  Questions for the pharmacist: No    Confirmed patient received a Conservation officer, historic buildings and a Surveyor, mining with first shipment. The patient will receive a drug information handout for each medication shipped and additional FDA Medication Guides as required.       DISEASE/MEDICATION-SPECIFIC INFORMATION        N/A    SPECIALTY MEDICATION ADHERENCE     Medication Adherence    Patient reported X missed doses in the last month: 0  Specialty Medication: tacrolimus 1 MG capsule (PROGRAF)  Patient is on additional specialty medications: Yes  Additional Specialty Medications: everolimus (immunosuppressive) 0.5 mg tablet (ZORTRESS)  Patient Reported Additional Medication X Missed Doses in the Last Month: 0  Patient is on more than two specialty medications: No              Were doses missed due to medication being on hold? No    everolimus 0.5 mg: 10 days of medicine on hand   Tacrolimus 1 mg: 10 days of medicine on hand       REFERRAL TO PHARMACIST     Referral to the pharmacist: Not needed      [facility]     Shipping address confirmed in Epic. Delivery Scheduled: Yes, Expected medication delivery date: 01/08/23.     Medication will be delivered via UPS to the prescription address in Epic WAM.    [name]   [facility] Specialty Technician

## 2023-01-06 DIAGNOSIS — Z944 Liver transplant status: Principal | ICD-10-CM

## 2023-01-06 DIAGNOSIS — Z5181 Encounter for therapeutic drug level monitoring: Principal | ICD-10-CM

## 2023-01-06 DIAGNOSIS — E612 Magnesium deficiency: Principal | ICD-10-CM

## 2023-01-06 NOTE — Unmapped (Signed)
Therapy Update Follow Up: No issues - Copay = $3.96

## 2023-01-07 MED FILL — ASPIRIN 81 MG TABLET,DELAYED RELEASE: ORAL | 90 days supply | Qty: 90 | Fill #3

## 2023-01-07 MED FILL — NOVOLOG FLEXPEN U-100 INSULIN ASPART 100 UNIT/ML (3 ML) SUBCUTANEOUS: 41 days supply | Qty: 15 | Fill #6

## 2023-01-07 MED FILL — EVEROLIMUS (IMMUNOSUPPRESSIVE) 0.5 MG TABLET: ORAL | 30 days supply | Qty: 240 | Fill #1

## 2023-01-07 MED FILL — GABAPENTIN 300 MG CAPSULE: ORAL | 30 days supply | Qty: 90 | Fill #2

## 2023-01-07 MED FILL — OZEMPIC 0.25 MG OR 0.5 MG (2 MG/3 ML) SUBCUTANEOUS PEN INJECTOR: SUBCUTANEOUS | 28 days supply | Qty: 3 | Fill #4

## 2023-01-07 MED FILL — TRESIBA FLEXTOUCH U-100 INSULIN 100 UNIT/ML (3 ML) SUBCUTANEOUS PEN: SUBCUTANEOUS | 100 days supply | Qty: 15 | Fill #1

## 2023-01-08 LAB — GAMMA GT: GAMMA GLUTAMYL TRANSFERASE: 32 IU/L (ref 0–65)

## 2023-01-08 LAB — CBC W/ DIFFERENTIAL
BANDED NEUTROPHILS ABSOLUTE COUNT: 0 10*3/uL (ref 0.0–0.1)
BASOPHILS ABSOLUTE COUNT: 0 10*3/uL (ref 0.0–0.2)
BASOPHILS RELATIVE PERCENT: 0 %
EOSINOPHILS ABSOLUTE COUNT: 0.2 10*3/uL (ref 0.0–0.4)
EOSINOPHILS RELATIVE PERCENT: 4 %
HEMATOCRIT: 39.8 % (ref 37.5–51.0)
HEMOGLOBIN: 12.9 g/dL — ABNORMAL LOW (ref 13.0–17.7)
IMMATURE GRANULOCYTES: 0 %
LYMPHOCYTES ABSOLUTE COUNT: 1.1 10*3/uL (ref 0.7–3.1)
LYMPHOCYTES RELATIVE PERCENT: 20 %
MEAN CORPUSCULAR HEMOGLOBIN CONC: 32.4 g/dL (ref 31.5–35.7)
MEAN CORPUSCULAR HEMOGLOBIN: 25.1 pg — ABNORMAL LOW (ref 26.6–33.0)
MEAN CORPUSCULAR VOLUME: 77 fL — ABNORMAL LOW (ref 79–97)
MONOCYTES ABSOLUTE COUNT: 0.7 10*3/uL (ref 0.1–0.9)
MONOCYTES RELATIVE PERCENT: 12 %
NEUTROPHILS ABSOLUTE COUNT: 3.5 10*3/uL (ref 1.4–7.0)
NEUTROPHILS RELATIVE PERCENT: 64 %
PLATELET COUNT: 172 10*3/uL (ref 150–450)
RED BLOOD CELL COUNT: 5.14 x10E6/uL (ref 4.14–5.80)
RED CELL DISTRIBUTION WIDTH: 14 % (ref 11.6–15.4)
WHITE BLOOD CELL COUNT: 5.5 10*3/uL (ref 3.4–10.8)

## 2023-01-08 LAB — COMPREHENSIVE METABOLIC PANEL
A/G RATIO: 1.5 (ref 1.2–2.2)
ALBUMIN: 3.9 g/dL (ref 3.9–4.9)
ALKALINE PHOSPHATASE: 214 IU/L — ABNORMAL HIGH (ref 44–121)
ALT (SGPT): 55 IU/L — ABNORMAL HIGH (ref 0–44)
AST (SGOT): 34 IU/L (ref 0–40)
BILIRUBIN TOTAL (MG/DL) IN SER/PLAS: 0.3 mg/dL (ref 0.0–1.2)
BLOOD UREA NITROGEN: 27 mg/dL (ref 8–27)
BUN / CREAT RATIO: 14 (ref 10–24)
CALCIUM: 9.4 mg/dL (ref 8.6–10.2)
CHLORIDE: 104 mmol/L (ref 96–106)
CO2: 21 mmol/L (ref 20–29)
CREATININE: 1.89 mg/dL — ABNORMAL HIGH (ref 0.76–1.27)
GLOBULIN, TOTAL: 2.6 g/dL (ref 1.5–4.5)
GLUCOSE: 123 mg/dL — ABNORMAL HIGH (ref 70–99)
POTASSIUM: 5.4 mmol/L — ABNORMAL HIGH (ref 3.5–5.2)
SODIUM: 139 mmol/L (ref 134–144)
TOTAL PROTEIN: 6.5 g/dL (ref 6.0–8.5)

## 2023-01-08 LAB — PHOSPHORUS: PHOSPHORUS, SERUM: 3.8 mg/dL (ref 2.8–4.1)

## 2023-01-08 LAB — BILIRUBIN, DIRECT: BILIRUBIN DIRECT: 0.11 mg/dL (ref 0.00–0.40)

## 2023-01-08 LAB — MAGNESIUM: MAGNESIUM: 1.8 mg/dL (ref 1.6–2.3)

## 2023-01-08 LAB — TACROLIMUS LEVEL: TACROLIMUS BLOOD: 2.6 ng/mL (ref 2.0–20.0)

## 2023-01-08 MED ORDER — TACROLIMUS 0.5 MG CAPSULE, IMMEDIATE-RELEASE
ORAL_CAPSULE | 11 refills | 0 days | Status: CP
Start: 2023-01-08 — End: ?
  Filled 2023-01-09: qty 150, 30d supply, fill #0

## 2023-01-08 NOTE — Unmapped (Signed)
Patient's 5/28 Tac level below goal of 3-5 after reducing his dose to 1mg  bid to keep him closer to 3. (EVL still pending.) Discussed with PharmD Chargualaf, who recommended increasing his dose to 1.5mg /1mg  daily. Ordered 0.5 mg strength caps and discussed dose adjustment with patient, who verbalized understanding.

## 2023-01-09 LAB — EVEROLIMUS: EVEROLIMUS LEVEL: 5.6 ng/mL (ref 3.0–8.0)

## 2023-01-09 NOTE — Unmapped (Deleted)
[facility] Pharm[facility] Refill Coordination Note    Specialty Medication(s) to be Shipped:   Transplant: tacrolimus 0.5mg     Other medication(s) to be shipped: No additional medications requested for fill at this time     [name], DOB: 12-07-1960  Phone: [phone] (home) [phone] (work)      All above HIPAA information was verified with patient.     Was a Nurse, learning disability used for this call? No    Completed refill call assessment today to schedule patient's medication shipment from the [facility] [phone]).  All relevant notes have been reviewed.     Specialty medication(s) and dose(s) confirmed: Regimen is correct and unchanged.   Changes to medications: Ahmaud reports no changes at this time.  Changes to insurance: No  New side effects reported not previously addressed with a pharmacist or physician: None reported  Questions for the pharmacist: No    Confirmed patient received a Conservation officer, historic buildings and a Surveyor, mining with first shipment. The patient will receive a drug information handout for each medication shipped and additional FDA Medication Guides as required.       DISEASE/MEDICATION-SPECIFIC INFORMATION        N/A    SPECIALTY MEDICATION ADHERENCE     Medication Adherence    Patient reported X missed doses in the last month: 0  Specialty Medication: Tacrolimus 1mg   Patient is on additional specialty medications: No              Were doses missed due to medication being on hold? No    Tacrolimus 0.5 mg: 0 days of medicine on hand     REFERRAL TO PHARMACIST     Referral to the pharmacist: Not needed      [facility]     Shipping address confirmed in Epic.       Delivery Scheduled: Yes, Expected medication delivery date: 01/10/23.     Medication will be delivered via UPS to the prescription address in Epic WAM.    Tera Helper, [facility]   [facility] Specialty Pharmacist

## 2023-01-09 NOTE — Unmapped (Signed)
[facility] Pharm[facility] Clinical Assessment & Refill Coordination Note    Matthew Key, Matthew Key: June 06, 1961  Phone: [phone] (home) [phone] (work)    All above HIPAA information was verified with patient.     Was a Nurse, learning disability used for this call? No    Specialty Medication(s):   Transplant: tacrolimus 1mg , tacrolimus 0.5mg , and Everolimus  0.5mg      Current Outpatient Medications   Medication Sig Dispense Refill    acetaminophen (TYLENOL) 325 MG tablet Take 1-2 tablets (325-650 mg total) by mouth every six (6) hours as needed for pain or fever. 100 tablet 11    amlodipine (NORVASC) 10 MG tablet Take 1 tablet (10 mg total) by mouth daily. 30 tablet 11    aspirin (ECOTRIN) 81 MG tablet Take 1 tablet (81 mg total) by mouth daily. 90 tablet 3    blood sugar diagnostic (ACCU-CHEK GUIDE TEST STRIPS) Strp Use to check blood sugar as directed with insulin 3 times a day & for symptoms of high or low blood sugar 100 strip 11    blood-glucose meter kit Use as instructed 1 each 0    blood-glucose meter Misc Use to test blood sugar as directed 1 each 0    carvedilol (COREG) 6.25 MG tablet Take 2 tablets (12.5 mg total) by mouth two (2) times a day. 360 tablet 3    DULoxetine (CYMBALTA) 30 MG capsule Take 2 capsules (60 mg total) by mouth daily. 180 capsule 3    everolimus, immunosuppressive, (ZORTRESS) 0.5 mg tablet Take 4 tablets (2 mg total) by mouth two (2) times a day. 240 tablet 11    gabapentin (NEURONTIN) 300 MG capsule Take 1 capsule (300 mg total) by mouth Three (3) times a day. 90 capsule 2    glucagon spray 3 mg/actuation Spry Use 1 spray into the left nostril every fifteen (15) minutes as needed (low blood sugar). 2 each 1    hydrOXYzine (VISTARIL) 50 MG capsule Take 1 capsule (50 mg total) by mouth nightly as needed for anxiety. 30 capsule 3    insulin aspart (NOVOLOG FLEXPEN) 100 unit/mL (3 mL) injection pen Inject under the skin per sliding scale prior to meals. If premeal BG 151-200 take 2 additional units for correction, if 201-250 take 4 additional units, etc. Max dose 36 units per day.  Store in-use prefilled pens at room temperature <86??F and use within 28 days; do not refrigerate. 15 mL 6    insulin degludec (TRESIBA FLEXTOUCH U-100) 100 unit/mL (3 mL) InPn Inject 0.15 mL (15 Units total) under the skin daily. Adjust as instructed. 15 mL 3    lancets (ACCU-CHEK SOFTCLIX LANCETS) Misc Use to check blood sugar as directed with insulin 3 times a day & for symptoms of high or low blood sugar. 100 each 0    methocarbamol (ROBAXIN) 500 MG tablet Take 1 tablet (500 mg total) by mouth Three (3) times a day as needed. 90 tablet 0    pantoprazole (PROTONIX) 40 MG tablet Take 1 tablet (40 mg total) by mouth daily as needed. 30 tablet 11    pen needle, diabetic (BD ULTRA-FINE NANO PEN NEEDLE) 32 gauge x 5/32 (4 mm) Ndle Inject 1 each under the skin Four (4) times a day with a meal and nightly. Use as directed  1-2 times per day 100 each 11    semaglutide (OZEMPIC) 0.25 mg or 0.5 mg (2 mg/3 mL) PnIj Inject 0.5 mg under the skin every seven (7) days.  6 mL 11    tacrolimus (PROGRAF) 0.5 MG capsule Take 3 capsules (1.5 mg total) by mouth daily AND 2 capsules (1 mg total) nightly. 150 capsule 11     No current facility-administered medications for this visit.        Changes to medications: Matthew Key reports no changes at this time.    Allergies   Allergen Reactions    Venom-Honey Bee Swelling       Changes to allergies: No    SPECIALTY MEDICATION ADHERENCE     Tacrolimus 1 mg: 30 days of medicine on hand   Tacrolimus 0.5 mg: 0 days of medicine on hand   Everolimus 0.5mg : 30 days of medicine on hand.    Medication Adherence    Patient reported X missed doses in the last month: 0  Specialty Medication: Tacrolimus 1mg   Patient is on additional specialty medications: Yes  Additional Specialty Medications: Everolimus 0.5mg   Patient Reported Additional Medication X Missed Doses in the Last Month: 0  Patient is on more than two specialty medications: No          Specialty medication(s) dose(s) confirmed: Regimen is correct and unchanged.     Are there any concerns with adherence? No    Adherence counseling provided? Not needed    CLINICAL MANAGEMENT AND INTERVENTION      Clinical Benefit Assessment:    Do you feel the medicine is effective or helping your condition? Yes    Clinical Benefit counseling provided? Not needed    Adverse Effects Assessment:    Are you experiencing any side effects? No    Are you experiencing difficulty administering your medicine? 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 For example, brushing your teeth or getting up in the morning. 0    Have you discussed this with your provider? Not needed    Acute Infection Status:    Acute infections noted within Epic:  Rule Out C. Diff  Patient reported infection: None    Therapy Appropriateness:    Is therapy appropriate and patient progressing towards therapeutic goals? Yes, therapy is appropriate and should be continued    DISEASE/MEDICATION-SPECIFIC INFORMATION      N/A    Solid Organ Transplant: Not Applicable    PATIENT SPECIFIC NEEDS     Does the patient have any physical, cognitive, or cultural barriers? No    Is the patient high risk? No    Did the patient require a clinical intervention? No    Does the patient require physician intervention or other additional services (i.e., nutrition, smoking cessation, social work)? No    SOCIAL DETERMINANTS OF HEALTH     At the [facility], we have learned that life circumstances - like trouble affording food, housing, utilities, or transportation can affect the health of many of our patients.   That is why we wanted to ask: are you currently experiencing any life circumstances that are negatively impacting your health and/or quality of life? Patient declined to answer    Social Determinants of Health     Financial Resource Strain: Medium Risk (09/16/2022)    Received from [facility]    Overall Financial Resource Strain (CARDIA)     Difficulty of Paying Living Expenses:  Somewhat hard   Internet Connectivity: Not on file   Food Insecurity: Food Insecurity Present (09/16/2022)    Received from [facility]    Hunger Vital Sign     Worried About Running Out of Food in the Last Year: Sometimes true     Ran Out of Food in the Last Year: Never true   Tobacco Use: Low Risk  (09/16/2022)    Received from [facility]    Patient History     Smoking Tobacco Use: Never     Smokeless Tobacco Use: Never     Passive Exposure: Never   Housing/Utilities: Low Risk  (08/28/2021)    Housing/Utilities     Within the past 12 months, have you ever stayed: outside, in a car, in a tent, in an overnight shelter, or temporarily in someone else's home (i.e. couch-surfing)?: No     Are you worried about losing your housing?: No     Within the past 12 months, have you been unable to get utilities (heat, electricity) when it was really needed?: No   Alcohol Use: Not At Risk (09/16/2022)    Received from [facility]    AUDIT-C     Frequency of Alcohol Consumption: Never     Average Number of Drinks: Not on file     Frequency of Binge Drinking: Not on file   Transportation Needs: No Transportation Needs (09/16/2022)    Received from [facility] - Transportation     Lack of Transportation (Medical): No     Lack of Transportation (Non-Medical): No   Substance Use: Not on file   Health Literacy: Not on file   Physical Activity: Unknown (09/16/2022)    Received from [facility]    Exercise Vital Sign     Days of Exercise per Week: 2 days     Minutes of Exercise per Session: Not on file   Interpersonal Safety: Not on file   Stress: Stress Concern Present (09/16/2022)    Received from [facility] - Occupational Stress Questionnaire     Feeling of Stress : To some extent   Intimate Partner Violence: Not At Risk (09/16/2022)    Received from [facility]    HITS     Over the last 12 months how often did your partner physically hurt you?: 1     Over the last 12 months how often did your partner insult you or talk down to you?: 1     Over the last 12 months how often did your partner threaten you with physical harm?: 1     Over the last 12 months how often did your partner scream or curse at you?: 1   Depression: Not at risk (09/16/2022)    Received from [facility]    Depression     PHQ-2 Total Score: 0   Social Connections: Moderately Integrated (09/16/2022)    Received from [facility]    Social Network     How would you rate your social network (family, work, friends)?: Adequate participation with social networks       Would you be willing to receive help with any of the needs that you have identified today? Not applicable       SHIPPING     Specialty Medication(s) to be Shipped:   Transplant: tacrolimus 0.5mg     Other medication(s) to be shipped: No additional medications requested for fill at this time  Changes to insurance: No    Delivery Scheduled: Yes, Expected medication delivery date: 01/10/23.     Medication will be delivered via UPS to the confirmed prescription address in [facility].    The patient will receive a drug information handout for each medication shipped and additional FDA Medication Guides as required.  Verified that patient has previously received a Conservation officer, historic buildings and a Surveyor, mining.    The patient or caregiver noted above participated in the development of this care plan and knows that they can request review of or adjustments to the care plan at any time.      All of the patient's questions and concerns have been addressed.    Tera Helper, [facility]   [facility] Specialty Pharmacist

## 2023-01-09 NOTE — Unmapped (Addendum)
[facility] Pharmacist has reviewed a new prescription for tacrolimus that indicates a dose increase.  Patient was counseled on this dosage change by [facility]- see epic note from 01/08/23.  Next refill call date adjusted if necessary.        [facility] Specialty Medication Onboarding    Specialty Medication: Tacrolimus 0.5mg  capsule  Prior Authorization: Not Required   Financial Assistance: No - copay  <$25  Final Copay/Day Supply: $7.54 / 30 days    Insurance Restrictions: None     Notes to Pharmacist: $22.51 for 90ds. Last filled Tac 1mg  02/15/2022.  Credit Card on File: no    The triage team has completed the benefits investigation and has determined that the patient is able to fill this medication at [facility]. Please contact the patient to complete the onboarding or follow up with the prescribing physician as needed.

## 2023-01-13 DIAGNOSIS — E612 Magnesium deficiency: Principal | ICD-10-CM

## 2023-01-13 DIAGNOSIS — Z944 Liver transplant status: Principal | ICD-10-CM

## 2023-01-13 DIAGNOSIS — Z5181 Encounter for therapeutic drug level monitoring: Principal | ICD-10-CM

## 2023-01-15 LAB — GAMMA GT: GAMMA GLUTAMYL TRANSFERASE: 47 IU/L (ref 0–65)

## 2023-01-15 LAB — COMPREHENSIVE METABOLIC PANEL
A/G RATIO: 1.6 (ref 1.2–2.2)
ALBUMIN: 3.8 g/dL — ABNORMAL LOW (ref 3.9–4.9)
ALKALINE PHOSPHATASE: 299 IU/L — ABNORMAL HIGH (ref 44–121)
ALT (SGPT): 212 IU/L — ABNORMAL HIGH (ref 0–44)
AST (SGOT): 132 IU/L — ABNORMAL HIGH (ref 0–40)
BILIRUBIN TOTAL (MG/DL) IN SER/PLAS: 0.4 mg/dL (ref 0.0–1.2)
BLOOD UREA NITROGEN: 22 mg/dL (ref 8–27)
BUN / CREAT RATIO: 13 (ref 10–24)
CALCIUM: 9.4 mg/dL (ref 8.6–10.2)
CHLORIDE: 104 mmol/L (ref 96–106)
CO2: 26 mmol/L (ref 20–29)
CREATININE: 1.65 mg/dL — ABNORMAL HIGH (ref 0.76–1.27)
GLOBULIN, TOTAL: 2.4 g/dL (ref 1.5–4.5)
GLUCOSE: 145 mg/dL — ABNORMAL HIGH (ref 70–99)
POTASSIUM: 5.2 mmol/L (ref 3.5–5.2)
SODIUM: 142 mmol/L (ref 134–144)
TOTAL PROTEIN: 6.2 g/dL (ref 6.0–8.5)

## 2023-01-15 LAB — CBC W/ DIFFERENTIAL
BANDED NEUTROPHILS ABSOLUTE COUNT: 0 10*3/uL (ref 0.0–0.1)
BASOPHILS ABSOLUTE COUNT: 0 10*3/uL (ref 0.0–0.2)
BASOPHILS RELATIVE PERCENT: 0 %
EOSINOPHILS ABSOLUTE COUNT: 0.2 10*3/uL (ref 0.0–0.4)
EOSINOPHILS RELATIVE PERCENT: 3 %
HEMATOCRIT: 40.1 % (ref 37.5–51.0)
HEMOGLOBIN: 12.7 g/dL — ABNORMAL LOW (ref 13.0–17.7)
IMMATURE GRANULOCYTES: 1 %
LYMPHOCYTES ABSOLUTE COUNT: 1 10*3/uL (ref 0.7–3.1)
LYMPHOCYTES RELATIVE PERCENT: 16 %
MEAN CORPUSCULAR HEMOGLOBIN CONC: 31.7 g/dL (ref 31.5–35.7)
MEAN CORPUSCULAR HEMOGLOBIN: 25 pg — ABNORMAL LOW (ref 26.6–33.0)
MEAN CORPUSCULAR VOLUME: 79 fL (ref 79–97)
MONOCYTES ABSOLUTE COUNT: 0.7 10*3/uL (ref 0.1–0.9)
MONOCYTES RELATIVE PERCENT: 10 %
NEUTROPHILS ABSOLUTE COUNT: 4.6 10*3/uL (ref 1.4–7.0)
NEUTROPHILS RELATIVE PERCENT: 70 %
PLATELET COUNT: 160 10*3/uL (ref 150–450)
RED BLOOD CELL COUNT: 5.08 x10E6/uL (ref 4.14–5.80)
RED CELL DISTRIBUTION WIDTH: 14.3 % (ref 11.6–15.4)
WHITE BLOOD CELL COUNT: 6.5 10*3/uL (ref 3.4–10.8)

## 2023-01-15 LAB — MAGNESIUM: MAGNESIUM: 1.8 mg/dL (ref 1.6–2.3)

## 2023-01-15 LAB — BILIRUBIN, DIRECT: BILIRUBIN DIRECT: 0.16 mg/dL (ref 0.00–0.40)

## 2023-01-15 LAB — PHOSPHORUS: PHOSPHORUS, SERUM: 3.9 mg/dL (ref 2.8–4.1)

## 2023-01-16 MED ORDER — AMOXICILLIN 875 MG-POTASSIUM CLAVULANATE 125 MG TABLET
ORAL_TABLET | Freq: Two times a day (BID) | ORAL | 0 refills | 5.00000 days | Status: CN
Start: 2023-01-16 — End: 2023-01-21

## 2023-01-16 NOTE — Unmapped (Addendum)
Patient's 6/5 lfts nearly quadrupled from last week's. Spoke to patient, who denies having any n/v/d, adding that the diarrhea he c/o last week usually only lasts 3-4 days and resolves for 2-3 weeks before returning. He denied taking any new medications as well. Sent a message to Dr.Moon regarding lfts, who responded with recommendation for him to take Augmentin 875-125 x 5 days, to complete next available MRCP, and to repeat labs again early next week. He added that if pt.develops fever or lfts rise further, he should be seen in the ED for admission. Spoke with patient and relayed recommendation. He verbalized understanding.

## 2023-01-17 LAB — TACROLIMUS LEVEL: TACROLIMUS BLOOD: 3.7 ng/mL (ref 2.0–20.0)

## 2023-01-17 LAB — EVEROLIMUS: EVEROLIMUS LEVEL: 9.4 ng/mL — ABNORMAL HIGH (ref 3.0–8.0)

## 2023-01-17 NOTE — Unmapped (Signed)
Patient's EVL level resulted today at 9.4. Reached out to [facility] pharmacist L.Chargualaf, who recommended patient repeat again next week. Messaged recommendation to patient via MyChart.

## 2023-01-22 LAB — CBC W/ DIFFERENTIAL
BANDED NEUTROPHILS ABSOLUTE COUNT: 0 10*3/uL (ref 0.0–0.1)
BASOPHILS ABSOLUTE COUNT: 0 10*3/uL (ref 0.0–0.2)
BASOPHILS RELATIVE PERCENT: 0 %
EOSINOPHILS ABSOLUTE COUNT: 0.2 10*3/uL (ref 0.0–0.4)
EOSINOPHILS RELATIVE PERCENT: 3 %
HEMATOCRIT: 39.5 % (ref 37.5–51.0)
HEMOGLOBIN: 12.2 g/dL — ABNORMAL LOW (ref 13.0–17.7)
IMMATURE GRANULOCYTES: 1 %
LYMPHOCYTES ABSOLUTE COUNT: 1.3 10*3/uL (ref 0.7–3.1)
LYMPHOCYTES RELATIVE PERCENT: 26 %
MEAN CORPUSCULAR HEMOGLOBIN CONC: 30.9 g/dL — ABNORMAL LOW (ref 31.5–35.7)
MEAN CORPUSCULAR HEMOGLOBIN: 24.3 pg — ABNORMAL LOW (ref 26.6–33.0)
MEAN CORPUSCULAR VOLUME: 79 fL (ref 79–97)
MONOCYTES ABSOLUTE COUNT: 0.6 10*3/uL (ref 0.1–0.9)
MONOCYTES RELATIVE PERCENT: 11 %
NEUTROPHILS ABSOLUTE COUNT: 2.9 10*3/uL (ref 1.4–7.0)
NEUTROPHILS RELATIVE PERCENT: 59 %
PLATELET COUNT: 140 10*3/uL — ABNORMAL LOW (ref 150–450)
RED BLOOD CELL COUNT: 5.03 x10E6/uL (ref 4.14–5.80)
RED CELL DISTRIBUTION WIDTH: 14.2 % (ref 11.6–15.4)
WHITE BLOOD CELL COUNT: 5 10*3/uL (ref 3.4–10.8)

## 2023-01-22 LAB — COMPREHENSIVE METABOLIC PANEL
A/G RATIO: 1.5
ALBUMIN: 3.6 g/dL — ABNORMAL LOW (ref 3.9–4.9)
ALKALINE PHOSPHATASE: 239 IU/L — ABNORMAL HIGH (ref 44–121)
ALT (SGPT): 99 IU/L — ABNORMAL HIGH (ref 0–44)
AST (SGOT): 77 IU/L — ABNORMAL HIGH (ref 0–40)
BILIRUBIN TOTAL (MG/DL) IN SER/PLAS: 0.3 mg/dL (ref 0.0–1.2)
BLOOD UREA NITROGEN: 15 mg/dL (ref 8–27)
BUN / CREAT RATIO: 10 (ref 10–24)
CALCIUM: 9.3 mg/dL (ref 8.6–10.2)
CHLORIDE: 103 mmol/L (ref 96–106)
CO2: 26 mmol/L (ref 20–29)
CREATININE: 1.55 mg/dL — ABNORMAL HIGH (ref 0.76–1.27)
EGFR: 50 mL/min/{1.73_m2} — ABNORMAL LOW
GLOBULIN, TOTAL: 2.4 g/dL (ref 1.5–4.5)
GLUCOSE: 174 mg/dL — ABNORMAL HIGH (ref 70–99)
POTASSIUM: 5.4 mmol/L — ABNORMAL HIGH (ref 3.5–5.2)
SODIUM: 140 mmol/L (ref 134–144)
TOTAL PROTEIN: 6 g/dL (ref 6.0–8.5)

## 2023-01-22 LAB — PHOSPHORUS: PHOSPHORUS, SERUM: 3.7 mg/dL (ref 2.8–4.1)

## 2023-01-22 LAB — MAGNESIUM: MAGNESIUM: 1.7 mg/dL (ref 1.6–2.3)

## 2023-01-22 LAB — BILIRUBIN, DIRECT: BILIRUBIN DIRECT: <0.1 mg/dL (ref 0.00–0.40)

## 2023-01-22 LAB — GAMMA GT: GAMMA GLUTAMYL TRANSFERASE: 52 IU/L (ref 0–65)

## 2023-01-23 ENCOUNTER — Ambulatory Visit: Admit: 2023-01-23 | Discharge: 2023-01-24 | Payer: MEDICARE

## 2023-01-23 MED ADMIN — gadobenate dimeglumine (MULTIHANCE) 529 mg/mL (0.1mmol/0.2mL) solution 0.1 mmol/kg (Dosing Weight): .1 mmol/kg | INTRAVENOUS | @ 21:00:00 | Stop: 2023-01-23

## 2023-01-24 DIAGNOSIS — R0600 Dyspnea, unspecified: Secondary | ICD-10-CM | POA: Insufficient documentation

## 2023-01-24 HISTORY — DX: Dyspnea, unspecified: R06.00

## 2023-01-24 LAB — EVEROLIMUS: EVEROLIMUS LEVEL: 11.2 ng/mL — ABNORMAL HIGH (ref 3.0–8.0)

## 2023-01-24 LAB — TACROLIMUS LEVEL: TACROLIMUS BLOOD: 3.8 ng/mL (ref 2.0–20.0)

## 2023-01-24 MED ORDER — EVEROLIMUS (IMMUNOSUPPRESSIVE) 0.5 MG TABLET
ORAL_TABLET | Freq: Two times a day (BID) | ORAL | 11 refills | 30 days | Status: CP
Start: 2023-01-24 — End: ?

## 2023-01-27 DIAGNOSIS — Z5181 Encounter for therapeutic drug level monitoring: Principal | ICD-10-CM

## 2023-01-27 DIAGNOSIS — E612 Magnesium deficiency: Principal | ICD-10-CM

## 2023-01-27 DIAGNOSIS — Z944 Liver transplant status: Principal | ICD-10-CM

## 2023-01-29 LAB — CBC W/ DIFFERENTIAL
BANDED NEUTROPHILS ABSOLUTE COUNT: 0 10*3/uL (ref 0.0–0.1)
BASOPHILS ABSOLUTE COUNT: 0 10*3/uL (ref 0.0–0.2)
BASOPHILS RELATIVE PERCENT: 0 %
EOSINOPHILS ABSOLUTE COUNT: 0.2 10*3/uL (ref 0.0–0.4)
EOSINOPHILS RELATIVE PERCENT: 3 %
HEMATOCRIT: 40.5 % (ref 37.5–51.0)
HEMOGLOBIN: 12.8 g/dL — ABNORMAL LOW (ref 13.0–17.7)
IMMATURE GRANULOCYTES: 0 %
LYMPHOCYTES ABSOLUTE COUNT: 1.6 10*3/uL (ref 0.7–3.1)
LYMPHOCYTES RELATIVE PERCENT: 22 %
MEAN CORPUSCULAR HEMOGLOBIN CONC: 31.6 g/dL (ref 31.5–35.7)
MEAN CORPUSCULAR HEMOGLOBIN: 24.5 pg — ABNORMAL LOW (ref 26.6–33.0)
MEAN CORPUSCULAR VOLUME: 77 fL — ABNORMAL LOW (ref 79–97)
MONOCYTES ABSOLUTE COUNT: 0.7 10*3/uL (ref 0.1–0.9)
MONOCYTES RELATIVE PERCENT: 10 %
NEUTROPHILS ABSOLUTE COUNT: 4.6 10*3/uL (ref 1.4–7.0)
NEUTROPHILS RELATIVE PERCENT: 65 %
PLATELET COUNT: 145 10*3/uL — ABNORMAL LOW (ref 150–450)
RED BLOOD CELL COUNT: 5.23 x10E6/uL (ref 4.14–5.80)
RED CELL DISTRIBUTION WIDTH: 14.3 % (ref 11.6–15.4)
WHITE BLOOD CELL COUNT: 7.1 10*3/uL (ref 3.4–10.8)

## 2023-01-29 LAB — COMPREHENSIVE METABOLIC PANEL
ALBUMIN: 4.1 g/dL (ref 3.9–4.9)
ALKALINE PHOSPHATASE: 244 IU/L — ABNORMAL HIGH (ref 44–121)
ALT (SGPT): 125 IU/L — ABNORMAL HIGH (ref 0–44)
AST (SGOT): 68 IU/L — ABNORMAL HIGH (ref 0–40)
BILIRUBIN TOTAL (MG/DL) IN SER/PLAS: 0.3 mg/dL (ref 0.0–1.2)
BLOOD UREA NITROGEN: 18 mg/dL (ref 8–27)
BUN / CREAT RATIO: 12 (ref 10–24)
CALCIUM: 9.7 mg/dL (ref 8.6–10.2)
CHLORIDE: 102 mmol/L (ref 96–106)
CO2: 23 mmol/L (ref 20–29)
CREATININE: 1.53 mg/dL — ABNORMAL HIGH (ref 0.76–1.27)
GLOBULIN, TOTAL: 2.5 g/dL (ref 1.5–4.5)
GLUCOSE: 133 mg/dL — ABNORMAL HIGH (ref 70–99)
POTASSIUM: 5.1 mmol/L (ref 3.5–5.2)
SODIUM: 140 mmol/L (ref 134–144)
TOTAL PROTEIN: 6.6 g/dL (ref 6.0–8.5)

## 2023-01-29 LAB — PHOSPHORUS: PHOSPHORUS, SERUM: 3.5 mg/dL (ref 2.8–4.1)

## 2023-01-29 LAB — GAMMA GT: GAMMA GLUTAMYL TRANSFERASE: 72 IU/L — ABNORMAL HIGH (ref 0–65)

## 2023-01-29 LAB — BILIRUBIN, DIRECT: BILIRUBIN DIRECT: 0.11 mg/dL (ref 0.00–0.40)

## 2023-01-29 LAB — MAGNESIUM: MAGNESIUM: 1.8 mg/dL (ref 1.6–2.3)

## 2023-01-29 NOTE — Unmapped (Signed)
Therapy Update Follow Up: No issues - Copay = $295.05

## 2023-01-30 DIAGNOSIS — Z5181 Encounter for therapeutic drug level monitoring: Principal | ICD-10-CM

## 2023-01-30 DIAGNOSIS — Z944 Liver transplant status: Principal | ICD-10-CM

## 2023-01-30 DIAGNOSIS — Z1389 Encounter for screening for other disorder: Principal | ICD-10-CM

## 2023-01-30 DIAGNOSIS — B259 Cytomegaloviral disease, unspecified: Principal | ICD-10-CM

## 2023-01-30 DIAGNOSIS — G8929 Other chronic pain: Principal | ICD-10-CM

## 2023-01-30 DIAGNOSIS — E559 Vitamin D deficiency, unspecified: Principal | ICD-10-CM

## 2023-01-30 DIAGNOSIS — E612 Magnesium deficiency: Principal | ICD-10-CM

## 2023-01-30 DIAGNOSIS — R7401 Elevated transaminase level: Principal | ICD-10-CM

## 2023-01-30 DIAGNOSIS — E119 Type 2 diabetes mellitus without complications: Principal | ICD-10-CM

## 2023-01-30 NOTE — Unmapped (Signed)
Patient's liver enzymes not improved over the last week's. Spoke to patient, who reports he is feeling fine and denies any n/v/d or abdominal pain. Discussed with Dr.Moon, who recommended that his his CMV be checked and if negative and lfts remain elevated, to proceed with liver biopsy. Will discuss with patient at first annual visit on Friday.

## 2023-01-31 ENCOUNTER — Ambulatory Visit: Admit: 2023-01-31 | Discharge: 2023-01-31 | Payer: MEDICARE

## 2023-01-31 ENCOUNTER — Ambulatory Visit: Admit: 2023-01-31 | Discharge: 2023-01-31 | Payer: MEDICARE | Attending: Psychologist | Primary: Psychologist

## 2023-01-31 DIAGNOSIS — Z944 Liver transplant status: Principal | ICD-10-CM

## 2023-01-31 DIAGNOSIS — M60831 Other myositis, right forearm: Principal | ICD-10-CM

## 2023-01-31 DIAGNOSIS — D849 Immunodeficiency, unspecified: Principal | ICD-10-CM

## 2023-01-31 DIAGNOSIS — R197 Diarrhea, unspecified: Principal | ICD-10-CM

## 2023-01-31 DIAGNOSIS — Z1389 Encounter for screening for other disorder: Principal | ICD-10-CM

## 2023-01-31 LAB — EVEROLIMUS: EVEROLIMUS LEVEL: 7.8 ng/mL (ref 3.0–8.0)

## 2023-01-31 LAB — TACROLIMUS LEVEL: TACROLIMUS BLOOD: 4.3 ng/mL (ref 2.0–20.0)

## 2023-01-31 NOTE — Unmapped (Signed)
ERCP  Procedure #1     Procedure #2   [id number]  MRN   [facility]  Endoscopist     Is the patient's health insurance [employer], [facility]), or [employer]vantage?     Urgent procedure     Are you pregnant?     Are you in the process of scheduling or awaiting results of a heart ultrasound, stress test, or catheterization to evaluate new or worsening chest pain, dizziness, or shortness of breath?     Do you take: Plavix (clopidogrel), Coumadin (warfarin), Lovenox (enoxaparin), Pradaxa (dabigatran), Effient (prasugrel), Xarelto (rivaroxaban), Eliquis (apixaban), Pletal (cilostazol), or Brilinta (ticagrelor)?          Which of the above medications are you taking?          What is the name of the medical practice that manages this medication?          What is the name of the medical provider who manages this medication?     Do you have hemophilia, von Willebrand disease, or low platelets?     Do you have a pacemaker or implanted cardiac defibrillator?     Has a [place] GI provider specified the location(s)?     Which location(s) did the [facility] provider specify?        [facility]        [place]        [facility]        [facility]     Is procedure indication for variceal banding (this does NOT include variceal screening)?     Have you had a heart attack, stroke or heart stent placement within the past 6 months?     Month of event     Year of event (ONLY ENTER LAST 2 DIGITS)        5  Height (feet)   8  Height (inches)   188  Weight (pounds)   28.6  BMI          Did the ordering provider specify a bowel prep?          What bowel prep was specified?     Do you have chronic kidney disease?     Do you have chronic constipation or have you had poor quality bowel preps for past colonoscopies?     Do you have Crohn's disease or ulcerative colitis?     Have you had weight loss surgery?          When you walk around your house or grocery store, do you have to stop and rest due to shortness of breath, chest pain, or light-headedness?     Do you ever use supplemental oxygen?     Have you been hospitalized for cirrhosis of the liver or heart failure in the last 12 months?     Have you been treated for mouth or throat cancer with radiation or surgery?     Have you been told that it is difficult for doctors to insert a breathing tube in you during anesthesia?     Have you had a heart or lung transplant?          Are you on dialysis?     Do you have cirrhosis of the liver?     Do you have myasthenia gravis?     Is the patient a prisoner?          Have you been diagnosed with sleep apnea or do you  wear a CPAP machine at night?     Are you younger than 30?     Have you previously received propofol sedation administered by an anesthesiologist for a GI procedure?     Do you drink an average of more than 3 drinks of alcohol per day?     Do you regularly take suboxone or any prescription medications for chronic pain?     Do you regularly take Ativan, Klonopin, Xanax, Valium, lorazepam, clonazepam, alprazolam, or diazepam?     Have you previously had difficulty with sedation during a GI procedure?     Have you been diagnosed with PTSD?     Are you allergic to fentanyl or midazolam (Versed)?     Do you take medications for HIV?   ################# ## ###################################################################################################################   MRN:          [id number]   Anticoag Review:  No   Nurse Triage:  No   GI Clinic Consult:  No   Procedure(s):  ERCP     0   [facility]  [facility]     [facility]     [place]     [facility]-Mod Sed   Endoscopist:  [facility] OR G[facility]   Urgent:            No   Prep:                                 ################# ## ###################################################################################################################

## 2023-01-31 NOTE — Unmapped (Signed)
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 Everolimus has been started in s/o diabetes to minimize tacrolimus burden. 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      Immunosuppresion: On combo tac/everolimus due to DM to minimiz tac burden.   - Tac goal: 3-5, currently on tac 1.5/1 ( with most recent tac level of 4.3 (goal to keep level closer to 3), will keep at current dose for now.  - Everolimus goal: 3-8, currently on 1.5mg  BID with level of 7.8 (changed 6/14 after 6/11 level was 11.2)     Abdominal Hernia: Has abdominal hernia that has been present since after his transplant, but reports he thinks it has worsened/gotten more noticeable.   - Referral to hernia center (appt scheduled)  - May have to consider periop management of everolimus given this can impact wound healing, will address if surgery is deemed appropriate.      Hypertension: Tried lisinopril, stopped due to side effects, tried chlorthalidone, stopped due to rising Cr.   - Continue amlodipine 10mg  daily, coreg 12.5mg  BID, BP at goal today (119/66)  - Discussed low Na diet, cannot do full DASH diet due to CKD and elevated K  - Recommended continued increasing physical activity    CKD: Elevated Cr in s/o longstanding HTN and CNI exposure, now on everolimus with plan to minimize CNI exposure as much as possible. Have had trouble treating HTN due to worsening Cr with multiple agents, and inability to tolerate lisinopril due to side effects.  - Could consider ARB in future (however had Cr elevation w/ACE)  - Management of IS and HTN as above     Pancreatic Cyst: Pancreatic cyst present on MRI 11/24/22, which were reportedly unchanged from prior however MRI from 2022 stated largest cyst was 0.7cm (now 1.4cm).   - repeat MRI/MRCP to monitor cystic lesion in 1 year (01/2024)     #Diarrhea: Patient has had ongoing intermittent profound diarrhea (leading to multiple nocturnal bowel movements) and episodes of fecal incontinence. Has had multiple evaluations for this that have been unrevealing, including a colonoscopy w/random biopsies in 07/2022 that demonstrated non-specific inflammation, and was negative for CMV.   - Follow up C-Diff and GIPP. Okay to take immodium sparingly given chronic diarrhea and low suspicion for acute infection  - ERCP Monday, if diarrhea doesn't  improve after this, and infectious stool studies negative, consider a trial course of budesonide (starting at 9mg  daily)  - Could consider cholestyramine for bile acid diarrhea but worry about absorption of other medications  - Could consider trial of rifaximin empirically for SIBO     #Preventive Hepatology Care:  - Colonoscopy: UTD, next due 2033  - Flu shot: UTD  - COVID: UTD  - Recommend dermatology evaluation annually due to risk of skin cancers with IS    I personally spent 45 minutes face-to-face and non-face-to-face in the care of this patient, which includes all pre, intra, and post visit time on the date of service.  All documented time was specific to the E/M visit and does not include any procedures that may have been performed. This included 20 minutes in review of labs, imaging and prior notes and 25 minutes in face-to-face care including history taking, physical exam and counseling     Tawni Carnes, MD, MPH  Assistant Professor of Medicine  [facility]  [facility]  Division of Gastroenterology and Hepatology      Subjective   History of Present Illness   Accompanied by: N/A (unaccompanied)    62 y.o. 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 Everolimus has been started in s/o diabetes to minimize tacrolimus burden. 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    Liver enzymes have been elevated since 10/2022:          Trialed on empiric abx and received MRCP, and although read was not suggestive of worsening, reviewed MRI/MRCP w/Dr. Charmaine Downs and there may be worsening of the stricture.    Off and on diarrhea, but reports that he's having watery diarrhea multiple times overnight, will wake him up overnight while he's sleeping. He occasionally has fecal incontinence. He reports this began on Wednesday, prior to this was not having diarrhea. He reports prior to these episodes he has new onset borborygmi. He takes immodium for this. He endorses cramping before episodes. No fevers or chills.     New meds: No OTC meds, no new supplements.     Everolimus level has been slowly rising 5 (4/30) -> 11.2 (6/11), so decreased dose to 1.5mg  BID and level now appropriate, 7.8 (6/18)    Patient reports paradoxical episodes where he gets a sharp pain in upper abdomen that lasts one minute, has occurred twice ever. Wondering if it's connected to diarrhea.     PEth pending    MRI/MRCP 6/14:  Postsurgical changes of orthotopic liver transplantation with near complete resolution of peribiliary enhancement and mild persistent patchy T2 signal. No evidence of new or enhancing lesion.      Stable distribution of pancreatic cystic lesions without definite communication with the main pancreatic duct, indeterminate.     Alcohol use: None  Tobacco use:     Objective   Physical Exam   Vital Signs: BP 119/66  - Pulse 83  - Temp 36.3 ??C (97.3 ??F) (Oral)  - Resp 20  - Wt 86.4 kg (190 lb 8 oz)  - SpO2 97%  - BMI 28.97 kg/m??   Constitutional: He is in no apparent distress  Eyes: Anicteric sclerae  Cardiovascular: No peripheral edema  Gastrointestinal: Soft, nontender abdomen  without hepatosplenomegaly, two upper abdominal hernias, soft, reducible  Neurologic: Awake, alert, and oriented to person, place, and time with normal speech and no asterixis    Tests ordered: I ordered C-Diff, GIPP to evaluate for causes of diarrhea  Patient is taking immunosuppressive medications due to liver transplantation and requires monitoring of renal function for signs of toxicity

## 2023-02-03 ENCOUNTER — Encounter
Admit: 2023-02-03 | Discharge: 2023-02-03 | Payer: MEDICARE | Attending: Certified Registered" | Primary: Certified Registered"

## 2023-02-03 ENCOUNTER — Ambulatory Visit: Admit: 2023-02-03 | Discharge: 2023-02-03 | Payer: MEDICARE

## 2023-02-03 DIAGNOSIS — E559 Vitamin D deficiency, unspecified: Principal | ICD-10-CM

## 2023-02-03 DIAGNOSIS — Z5181 Encounter for therapeutic drug level monitoring: Principal | ICD-10-CM

## 2023-02-03 DIAGNOSIS — M609 Myositis, unspecified: Principal | ICD-10-CM

## 2023-02-03 DIAGNOSIS — E789 Disorder of lipoprotein metabolism, unspecified: Principal | ICD-10-CM

## 2023-02-03 DIAGNOSIS — Z944 Liver transplant status: Principal | ICD-10-CM

## 2023-02-03 DIAGNOSIS — Z1389 Encounter for screening for other disorder: Principal | ICD-10-CM

## 2023-02-03 DIAGNOSIS — E612 Magnesium deficiency: Principal | ICD-10-CM

## 2023-02-03 DIAGNOSIS — Z9189 Other specified personal risk factors, not elsewhere classified: Principal | ICD-10-CM

## 2023-02-03 DIAGNOSIS — Z794 Long term (current) use of insulin: Principal | ICD-10-CM

## 2023-02-03 DIAGNOSIS — E119 Type 2 diabetes mellitus without complications: Principal | ICD-10-CM

## 2023-02-03 DIAGNOSIS — M60831 Other myositis, right forearm: Principal | ICD-10-CM

## 2023-02-03 DIAGNOSIS — Z9889 Other specified postprocedural states: Principal | ICD-10-CM

## 2023-02-03 DIAGNOSIS — R7989 Other specified abnormal findings of blood chemistry: Principal | ICD-10-CM

## 2023-02-03 DIAGNOSIS — D849 Immunodeficiency, unspecified: Principal | ICD-10-CM

## 2023-02-03 DIAGNOSIS — R197 Diarrhea, unspecified: Principal | ICD-10-CM

## 2023-02-03 MED ORDER — LEVOFLOXACIN 500 MG TABLET
ORAL_TABLET | Freq: Every day | ORAL | 0 refills | 4 days | Status: CP
Start: 2023-02-03 — End: 2023-02-07

## 2023-02-03 MED ADMIN — Propofol (DIPRIVAN) injection: INTRAVENOUS | @ 12:00:00 | Stop: 2023-02-03

## 2023-02-03 MED ADMIN — lidocaine (PF) (XYLOCAINE-MPF) 20 mg/mL (2 %) injection: INTRAVENOUS | @ 12:00:00 | Stop: 2023-02-03

## 2023-02-03 MED ADMIN — succinylcholine chloride (ANECTINE) injection: INTRAVENOUS | @ 12:00:00 | Stop: 2023-02-03

## 2023-02-03 MED ADMIN — levoFLOXacin (LEVAQUIN) 500 mg/100 mL IVPB: INTRAVENOUS | @ 12:00:00 | Stop: 2023-02-03

## 2023-02-03 MED ADMIN — lactated Ringers infusion: 10 mL/h | INTRAVENOUS | @ 12:00:00 | Stop: 2023-02-03

## 2023-02-03 MED ADMIN — ondansetron (ZOFRAN) injection: INTRAVENOUS | @ 12:00:00 | Stop: 2023-02-03

## 2023-02-03 MED ADMIN — lactated Ringers infusion: INTRAVENOUS | @ 12:00:00 | Stop: 2023-02-03

## 2023-02-03 MED ADMIN — dexmedeTOMIDine (Precedex) 80 mcg/20 mL (4 mcg/mL) injection: INTRAVENOUS | @ 12:00:00 | Stop: 2023-02-03

## 2023-02-03 MED ADMIN — fentaNYL (PF) (SUBLIMAZE) injection: INTRAVENOUS | @ 12:00:00 | Stop: 2023-02-03

## 2023-02-03 MED ADMIN — dexAMETHasone (DECADRON) 4 mg/mL injection: INTRAVENOUS | @ 12:00:00 | Stop: 2023-02-03

## 2023-02-03 NOTE — Unmapped (Signed)
Spoke to patient this morning while he was enroute to the hospital for his ERCP. He reported that his diarrhea had resolved, and he was unable to provide a stool sample but agreed to repeat labs after the procedure today. Noted from procedure report that he had stent placement. Reached out to Dr.Moon, who recommended he receive levaquin daily x4 more days. Returned call to patient, who confirmed he had not had labs drawn b/c he did not feel well after the procedure. Relayed recommendation to continue po levaquin at home tomorrow. Also urged him to complete labs as soon as possible and to take stool for testing if diarrhea recurs. He verbalized understanding of all discussed.

## 2023-02-04 DIAGNOSIS — M25531 Pain in right wrist: Principal | ICD-10-CM

## 2023-02-04 DIAGNOSIS — I10 Essential (primary) hypertension: Principal | ICD-10-CM

## 2023-02-04 MED ORDER — CARVEDILOL 6.25 MG TABLET
ORAL_TABLET | Freq: Two times a day (BID) | ORAL | 3 refills | 90 days
Start: 2023-02-04 — End: 2024-02-04

## 2023-02-04 MED ORDER — GABAPENTIN 300 MG CAPSULE
ORAL_CAPSULE | Freq: Three times a day (TID) | ORAL | 2 refills | 30 days
Start: 2023-02-04 — End: ?

## 2023-02-04 NOTE — Unmapped (Addendum)
Patient called to report that he completed labs this morning at [facility] and that he has felt severe generalized muscle pain since last night and was unable to sleep. He denies fever and confirmed that he had taken his Levaquin today. Reached out to Dr. Waynetta Sandy who ordered a prn dose of oxycodone for the patient, recommending that if sx do not improve in 5 days, then he should be seen in the ED. Spoke with patient and relayed recommendations and dosing for oxycodone. He verbalized understanding.

## 2023-02-05 LAB — CBC W/ DIFFERENTIAL
BANDED NEUTROPHILS ABSOLUTE COUNT: 0.1 10*3/uL (ref 0.0–0.1)
BASOPHILS ABSOLUTE COUNT: 0 10*3/uL (ref 0.0–0.2)
BASOPHILS RELATIVE PERCENT: 0 %
EOSINOPHILS ABSOLUTE COUNT: 0 10*3/uL (ref 0.0–0.4)
EOSINOPHILS RELATIVE PERCENT: 0 %
HEMATOCRIT: 36.8 % — ABNORMAL LOW (ref 37.5–51.0)
HEMOGLOBIN: 11.8 g/dL — ABNORMAL LOW (ref 13.0–17.7)
IMMATURE GRANULOCYTES: 1 %
LYMPHOCYTES ABSOLUTE COUNT: 1.2 10*3/uL (ref 0.7–3.1)
LYMPHOCYTES RELATIVE PERCENT: 17 %
MEAN CORPUSCULAR HEMOGLOBIN CONC: 32.1 g/dL (ref 31.5–35.7)
MEAN CORPUSCULAR HEMOGLOBIN: 24.6 pg — ABNORMAL LOW (ref 26.6–33.0)
MEAN CORPUSCULAR VOLUME: 77 fL — ABNORMAL LOW (ref 79–97)
MONOCYTES ABSOLUTE COUNT: 0.7 10*3/uL (ref 0.1–0.9)
MONOCYTES RELATIVE PERCENT: 9 %
NEUTROPHILS ABSOLUTE COUNT: 5.5 10*3/uL (ref 1.4–7.0)
NEUTROPHILS RELATIVE PERCENT: 73 %
PLATELET COUNT: 168 10*3/uL (ref 150–450)
RED BLOOD CELL COUNT: 4.79 x10E6/uL (ref 4.14–5.80)
RED CELL DISTRIBUTION WIDTH: 14.5 % (ref 11.6–15.4)
WHITE BLOOD CELL COUNT: 7.5 10*3/uL (ref 3.4–10.8)

## 2023-02-05 LAB — PHOSPHORUS: PHOSPHORUS, SERUM: 3.1 mg/dL (ref 2.8–4.1)

## 2023-02-05 LAB — COMPREHENSIVE METABOLIC PANEL
ALBUMIN: 4.1 g/dL (ref 3.9–4.9)
ALKALINE PHOSPHATASE: 247 IU/L — ABNORMAL HIGH (ref 44–121)
ALT (SGPT): 68 IU/L — ABNORMAL HIGH (ref 0–44)
AST (SGOT): 37 IU/L (ref 0–40)
BILIRUBIN TOTAL (MG/DL) IN SER/PLAS: 0.3 mg/dL (ref 0.0–1.2)
BLOOD UREA NITROGEN: 21 mg/dL (ref 8–27)
BUN / CREAT RATIO: 12 (ref 10–24)
CALCIUM: 9.7 mg/dL (ref 8.6–10.2)
CHLORIDE: 101 mmol/L (ref 96–106)
CO2: 23 mmol/L (ref 20–29)
CREATININE: 1.72 mg/dL — ABNORMAL HIGH (ref 0.76–1.27)
EGFR: 44 mL/min/{1.73_m2} — ABNORMAL LOW
GLOBULIN, TOTAL: 2.7 g/dL (ref 1.5–4.5)
GLUCOSE: 225 mg/dL — ABNORMAL HIGH (ref 70–99)
POTASSIUM: 4.6 mmol/L (ref 3.5–5.2)
SODIUM: 141 mmol/L (ref 134–144)
TOTAL PROTEIN: 6.8 g/dL (ref 6.0–8.5)

## 2023-02-05 LAB — LIPID PANELX (LABCORP ONLY)
CHOLESTEROL, TOTAL: 223 mg/dL — ABNORMAL HIGH (ref 100–199)
HDL CHOLESTEROL: 43 mg/dL
LDL CHOLESTEROL CALCULATED: 144 mg/dL — ABNORMAL HIGH (ref 0–99)
TRIGLYCERIDES: 199 mg/dL — ABNORMAL HIGH (ref 0–149)
VLDL CHOLESTEROL CAL: 36 mg/dL (ref 5–40)

## 2023-02-05 LAB — HEMOGLOBIN A1C: HEMOGLOBIN A1C: 7.3 % — ABNORMAL HIGH (ref 4.8–5.6)

## 2023-02-05 LAB — LIPASE: LIPASE: 28 U/L (ref 13–78)

## 2023-02-05 LAB — GAMMA GT: GAMMA GLUTAMYL TRANSFERASE: 67 IU/L — ABNORMAL HIGH (ref 0–65)

## 2023-02-05 LAB — MAGNESIUM: MAGNESIUM: 1.6 mg/dL (ref 1.6–2.3)

## 2023-02-05 LAB — BILIRUBIN, DIRECT: BILIRUBIN DIRECT: 0.1 mg/dL (ref 0.00–0.40)

## 2023-02-05 MED ORDER — CARVEDILOL 6.25 MG TABLET
ORAL_TABLET | Freq: Two times a day (BID) | ORAL | 3 refills | 90 days | Status: CP
Start: 2023-02-05 — End: 2024-02-05

## 2023-02-05 MED ORDER — GABAPENTIN 300 MG CAPSULE
ORAL_CAPSULE | Freq: Three times a day (TID) | ORAL | 0 refills | 30 days | Status: CP
Start: 2023-02-05 — End: ?
  Filled 2023-02-06: qty 90, 30d supply, fill #0

## 2023-02-05 MED ORDER — OXYCODONE 5 MG TABLET
ORAL_TABLET | ORAL | 0 refills | 2 days | Status: CP | PRN
Start: 2023-02-05 — End: 2023-02-10

## 2023-02-05 NOTE — Unmapped (Signed)
The patient is requesting a medication refill

## 2023-02-05 NOTE — Unmapped (Signed)
[facility] PHARMACY NOTE  Matthew Key  [id number]    Medication changes:   Restart atorvastatin 40 mg daily  Increase carvedilol to 12.5 mg BID     Education/Adherence tools provided today:  - Provided additional education on immunosuppression and transplant related medications including reviewing indications of medications, dosing and side effects    Follow up items:  Goal of understanding indications and dosing of immunosuppression medications  EVL plan - if continue need UA/UPC  Establish with nephrology and dermatologist  Vitamin D    Next visit with pharmacy in 1-3 months  ____________________________________________________________________    Matthew Key is a 62 y.o. 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     Immunologic Risk: first transplant    Induction Agent : basiliximab    Donor Factors: DBD    Other PMH significant for Hypertension and Stroke, T2DM, OSA on CPAP    Post op course uncomplicated  8/8-8/9 hospitalized due to elevated liver enzymes; He was evaluated with a liver doppler ultrasound which showed patent veins and arteries. Labs were obtained and showed improvement in LFTs.  8/10: EMG with no findings of neuropathy  Admission 2/29-3/4: transaminitis, ERCP 3/1 w stent removal, stricture gone, stones and sludge swept.     Rejection History: NTD  Infection History: NTD  ___________________________________________________________________    Last seen by pharmacy ~ 3 months ago    Interval History:   4/19: Start carvedilol 6.25 mg BID, stop ursodiol  5/3: Increases carvedilol to 12.5 mg BID   5/16: Reduce tacrolimus to 1 mg BID  5/29: Increase tac to 1.5+1  6/5: LFTs increased.  Treated with Augmenting x5d for possible cholangitis  6/14: Reduce EVL to 1.5 mg BID  6/24: ERCP w/stent placement into common bile duct, cholangitis ppx with LVQ  6/25: Reported generalized muscle pain, prescribed oxycodone PRN    Seen by pharmacy today for: medication management and blood glucose management and education    CC:  Patient has no complaints today    General: No issues  Neuro: No issues   CV: No issues   Resp: No issues   GI: No issues   GU: No issues   Derm: No issues  Psych: No issues.    Fluid Status:   Edema no  SOB no  Intake: 4-5 bottles of water per day    There were no vitals filed for this visit.    ___________________________________________________________________    Allergies   Allergen Reactions    Venom-Honey Bee Swelling       Medications reviewed in EPIC medication station and updated today by the clinical pharmacist practitioner.    Current Outpatient Medications   Medication Instructions    acetaminophen (TYLENOL) 325-650 mg, Oral, Every 6 hours PRN    amlodipine (NORVASC) 10 mg, Oral, Daily (standard)    aspirin (ECOTRIN) 81 mg, Oral, Daily (standard)    blood sugar diagnostic (ACCU-CHEK GUIDE TEST STRIPS) Strp Use to check blood sugar as directed with insulin 3 times a day & for symptoms of high or low blood sugar    blood-glucose meter kit Use as instructed    blood-glucose meter Misc Use to test blood sugar as directed    carvedilol (COREG) 12.5 mg, Oral, 2 times a day (standard)    DULoxetine (CYMBALTA) 60 mg, Oral, Daily (standard)    everolimus (immunosuppressive) (ZORTRESS) 1.5 mg, Oral, 2 times a day (standard)    gabapentin (NEURONTIN) 300  mg, Oral, 3 times a day (standard)    glucagon spray 3 mg/actuation Spry Use 1 spray into the left nostril every fifteen (15) minutes as needed (low blood sugar).    insulin aspart (NOVOLOG FLEXPEN) 100 unit/mL (3 mL) injection pen Inject under the skin per sliding scale prior to meals. If premeal BG 151-200 take 2 additional units for correction, if 201-250 take 4 additional units, etc. Max dose 36 units per day.  Store in-use prefilled pens at room temperature <86??F and use within 28 days; do not refrigerate.    lancets (ACCU-CHEK SOFTCLIX LANCETS) Misc Use to check blood sugar as directed with insulin 3 times a day & for symptoms of high or low blood sugar.    levoFLOXacin (LEVAQUIN) 500 mg, Oral, Daily (standard)    methocarbamol (ROBAXIN) 500 mg, Oral, 3 times a day PRN    OZEMPIC 0.5 mg, Subcutaneous, Every 7 days    pantoprazole (PROTONIX) 40 mg, Oral, Daily PRN    pen needle, diabetic (BD ULTRA-FINE NANO PEN NEEDLE) 32 gauge x 5/32 (4 mm) Ndle 1 each, Subcutaneous, 4 times a day (with meals/HS), Use as directed  1-2 times per day    tacrolimus (PROGRAF) 0.5 MG capsule Take 3 capsules (1.5 mg total) by mouth daily AND 2 capsules (1 mg total) nightly.    TRESIBA FLEXTOUCH U-100 15 Units, Subcutaneous, Daily (standard), Adjust as instructed.       GRAFT FUNCTION: stable    Lab Results   Component Value Date    AST 37 02/04/2023    ALT 68 (H) 02/04/2023    Total Bilirubin 0.3 02/04/2023      Zero hour biopsy: mild reperfusion injury on donor graft. No malignancy on explanted liver.  Biopsies to date: 12/5 - presence of kupffer cells suggesting recent/resolving hepatocellular injury     Renal Function:  above baseline  Baseline ~1.4 - 1.5    Lab Results   Component Value Date    Creatinine 1.72 (H) 02/04/2023    Creatinine 1.53 (H) 01/28/2023    Creatinine 1.55 (H) 01/21/2023    Creatinine 1.65 (H) 01/14/2023       Proteinuria/UPC: Yes: last check Jan 2024.  Lab Results   Component Value Date    Protein/Creatinine Ratio, Urine 0.432 08/16/2022    Protein/Creatinine Ratio, Urine 0.156 04/08/2022       CURRENT IMMUNOSUPPRESSION:    Tacrolimus (Prograf) 1.5 mg every morning + 1 mg every evening    Tacrolimus Goal: ~3    Everolimus 1.5 mg BID  Everolimus goal: 3-8    Switched from MMF to allow for decreased tac goal and preserve renal function    IMMUNOSUPPRESSION DRUG LEVELS:  Lab Results   Component Value Date    Tacrolimus, Trough 2.5 (L) 11/29/2022    Tacrolimus, Trough 2.8 (L) 11/05/2022    Tacrolimus, Trough 3.6 (L) 10/10/2022    Tacrolimus Lvl 4.3 01/28/2023    Tacrolimus Lvl 3.8 01/21/2023    Tacrolimus Lvl 3.7 01/14/2023     Lab Results   Component Value Date    Everolimus Level 7.8 01/28/2023    Everolimus Level 11.2 (H) 01/21/2023    Everolimus Level 9.4 (H) 01/14/2023       Did not have drug levels drawn today    WBC/ANC:  wnl  Lab Results   Component Value Date    WBC 7.5 02/04/2023       Plan: Will discuss long term EVL plan with Dr. Waynetta Sandy.  Given cost (>$  300/mo), lack of rejection consider stopping EVL and continuing with tacrolimus monotherapy    OI Prophylaxis:   CMV Status: D+/ R+, moderate risk .   Ppx complete     CVA history: asa 81 mg   The 10-year ASCVD risk score (Arnett DK, et al., 2019) is: 24.3%  No results found for: LIPID    Statin therapy: Indicated; none (atorvastatin held for elevated LFTs)  Plan: Continue to monitor       BP: Goal < 140/90.   Home BP ranges: 140 - 150s/70s    Current meds include: amlodipine 10 mg daily, carvedilol 6.25 mg BID (supposed to have increased in April but did not)  Past meds include: lisinopril 2.5mg  daily (stopped due to lightheadedness), chlorthalidone (stopped 1 week ago for elevated Scr)  Plan: out of goal. Increase carvedilol to 12.5 mg BID.  Continue to monitor    Anemia of CKD:  H/H:   Lab Results   Component Value Date    HGB 11.8 (L) 02/04/2023     Lab Results   Component Value Date    HCT 36.8 (L) 02/04/2023     Iron panel:  Lab Results   Component Value Date    IRON 85 03/11/2022    TIBC 279 03/11/2022    FERRITIN 250.9 03/11/2022     Lab Results   Component Value Date    Iron Saturation (%) 30 03/11/2022     Prior ESA use: none post transplnat  Plan: stable. No change.  Continue to monitor.     DM:   Lab Results   Component Value Date    A1C 7.3 (H) 02/04/2023   . Goal A1c < 7  A1c increasing  History of Dm? Yes: T2DM  Established with endocrinologist/PCP for BG managment? Yes: Dr. Melrose Nakayama with [facility]    Currently on: Tresiba 15 units daily, Novolog SSI 2: 50 > 150 mg/dl; Ozempic 0.5mg  weekly       Plan: No change. Defer to Endo.  Continue to monitor.       Electrolytes: wnl.  Lab Results   Component Value Date    Potassium 4.6 02/04/2023    Potassium, Bld 4.2 02/06/2022    Sodium 141 02/04/2023    Sodium Whole Blood 141 02/06/2022    Magnesium 1.6 02/04/2023    CO2 23 02/04/2023     Meds currently on: none  Plan: Continue to monitor    GI/BM: pt reports infrequent  normal BM ; rare heartburn  Meds currently on: pantoprazole 40 mg daily PRN, loepramide 2 mg PRN (couple times per week)  Plan: Continue to monitor    Pain: pt reports moderate pain despite use of below regimen    Meds currently on: APAP PRN, gabapentin 300 mg TID, Cymbalta 60 mg daily  Plan: Continue to monitor      Bone health:   Vitamin D Level: last level is 43.1 . Goal > 30.   Lab Results   Component Value Date    Vitamin D Total (25OH) 43.1 05/06/2022       Lab Results   Component Value Date    Calcium 9.7 02/04/2023    Calcium 9.7 01/28/2023     Last DEXA results:  01/2020: osteopenia (spine T score -1.1, femoral neck -1.7)  Current meds include: none  Plan: Vitamin D level  within goal in September. Continue to monitor.     Anxiety:  Meds currently on: hydroxyzine 50 HS PRN (rarely needs)  Women's/Men's Health:  Matthew Key is a 62 y.o. male. Patient reports no men's/women's health issues  Plan: Continue to monitor    BPH:  Meds currently on: tamsulosin 0.4 mg daily  Plan: Continue to monitor    Immunizations:  Influenza [Annual]: Received 06/2021, 05/06/22    PCV13: Received 11/2017  PPSV23: Received 03/2020  PCV20: Received 08/2021    Shingrix Zoster [2 doses, 2 - 6 months apart]: Received 05/2020, 07/2020    COVID-19 [3 primary doses, 2 boosters]: 1st dose given 10/2019, 2nd dose given 10/2019, 3rd dose given 05/2020, and Booster given 08/2021 (bivalent), 08/16/22    Pharmacy preference:  [facility]  Medication Refills:  N/a  Medication Access:  N/a    Adherence: Patient has good understanding of medications; was able to independently identify names/doses of immunosuppressants and OI meds.  Patient  does not fill their own pill box on a regular basis at home. States he has stopped using   Patient brought medication card:no  Pill box: n/a  Plan: Provided moderate adherence counseling/intervention    Patient was reviewed with  Dr. Waynetta Sandy :     During this visit, the following was completed:   BG log data assessment  BP log data assessment  Labs ordered and evaluated  complex treatment plan >1 DS  delivering clinical care and providing education/counseling.    All questions/concerns were addressed to the patient's satisfaction.  __________________________________________  Jordan Likes, CPP  Solid Organ Transplant Clinical Pharmacist Practitioner      The patient reports they are physically located in [facility] and is currently: at home. I conducted a audio/video visit. I spent  48m 24s on the video call with the patient. I spent an additional 15 minutes on pre- and post-visit activities on the date of service .

## 2023-02-05 NOTE — Unmapped (Addendum)
Spoke to patient about changing their accord tacrolimus to Sandoz.  Told the patient to finish the old mfg then start the new one.  Told patient to give the clinic a call to schedule labs when they start the new sandoz brand. PT acknowledged understanding.-dcr        [facility] Refill Coordination Note    Specialty Medication(s) to be Shipped:   Transplant: tacrolimus 0.5 mg    Other medication(s) to be shipped:  amlodipine , pen needles , duloxetine and gabapentin     Matthew Key, DOB: 07/24/1961  Phone: [phone] (home) [phone] (work)      All above HIPAA information was verified with patient.     Was a Nurse, learning disability used for this call? No    Completed refill call assessment today to schedule patient's medication shipment from the [facility] [phone]).  All relevant notes have been reviewed.     Specialty medication(s) and dose(s) confirmed: Regimen is correct and unchanged.   Changes to medications: Matthew Key reports no changes at this time.  Changes to insurance: No  New side effects reported not previously addressed with a pharmacist or physician: None reported  Questions for the pharmacist: No    Confirmed patient received a Conservation officer, historic buildings and a Surveyor, mining with first shipment. The patient will receive a drug information handout for each medication shipped and additional FDA Medication Guides as required.       DISEASE/MEDICATION-SPECIFIC INFORMATION        N/A    SPECIALTY MEDICATION ADHERENCE     Medication Adherence    Patient reported X missed doses in the last month: 0  Specialty Medication: tacrolimus 0.5 MG capsule (PROGRAF)  Patient is on additional specialty medications: No              Were doses missed due to medication being on hold? No    tacrolimus 0.5 mg: 5 days of medicine on hand     REFERRAL TO PHARMACIST     Referral to the pharmacist: Not needed      [facility]     Shipping address confirmed in Epic.       Delivery Scheduled: Yes, Expected medication delivery date: 02/07/23.     Medication will be delivered via UPS to the prescription address in Epic WAM.    [facility] Key   [facility] Specialty Technician

## 2023-02-05 NOTE — Unmapped (Signed)
Matthew Key has some abdominal pain post-ERCP. Workup is negative for pancreatitis. It is likely this is related to stent placement. I will prescribe a short-course of oxycodone 5 mg as needed for pain (duration 5 days). I have performed a PMP search and there are no inappropriate fill history and there is a below average unintentional overdose risk.    Tawni Carnes, MD, MPH  Assistant Professor of Medicine  [facility]  [facility]  Division of Gastroenterology and Hepatology

## 2023-02-06 ENCOUNTER — Institutional Professional Consult (permissible substitution): Admit: 2023-02-06 | Discharge: 2023-02-06 | Payer: MEDICARE

## 2023-02-06 ENCOUNTER — Telehealth: Admit: 2023-02-06 | Discharge: 2023-02-06 | Payer: MEDICARE

## 2023-02-06 ENCOUNTER — Ambulatory Visit: Admit: 2023-02-06 | Discharge: 2023-02-06 | Payer: MEDICARE | Attending: Psychologist | Primary: Psychologist

## 2023-02-06 DIAGNOSIS — Z944 Liver transplant status: Principal | ICD-10-CM

## 2023-02-06 LAB — TACROLIMUS LEVEL: TACROLIMUS BLOOD: 3 ng/mL (ref 2.0–20.0)

## 2023-02-06 LAB — EVEROLIMUS: EVEROLIMUS LEVEL: 6.2 ng/mL (ref 3.0–8.0)

## 2023-02-06 MED ORDER — ATORVASTATIN 40 MG TABLET
ORAL_TABLET | Freq: Every day | ORAL | 3 refills | 90 days | Status: CP
Start: 2023-02-06 — End: 2024-02-06
  Filled 2023-02-06: qty 90, 90d supply, fill #0

## 2023-02-06 MED ORDER — CARVEDILOL 12.5 MG TABLET
ORAL_TABLET | Freq: Two times a day (BID) | ORAL | 3 refills | 90 days | Status: CP
Start: 2023-02-06 — End: 2024-02-06
  Filled 2023-02-06: qty 180, 90d supply, fill #0

## 2023-02-06 MED FILL — AMLODIPINE 10 MG TABLET: ORAL | 90 days supply | Qty: 90 | Fill #2

## 2023-02-06 MED FILL — TACROLIMUS 0.5 MG CAPSULE, IMMEDIATE-RELEASE: ORAL | 30 days supply | Qty: 150 | Fill #1

## 2023-02-06 MED FILL — DULOXETINE 30 MG CAPSULE,DELAYED RELEASE: ORAL | 90 days supply | Qty: 180 | Fill #2

## 2023-02-06 MED FILL — BD ULTRA-FINE NANO PEN NEEDLE 32 GAUGE X 5/32" (4 MM): SUBCUTANEOUS | 25 days supply | Qty: 100 | Fill #1

## 2023-02-06 NOTE — Unmapped (Signed)
[facility] Outpatient Nutrition Services   Medical Nutrition Therapy Consultation       Visit Type:    Return Assessment    Referral Reason: :  Liver Transplant 02/06/22      Matthew Key is a 62 y.o. male seen for medical nutrition therapy for liver transplant follow up. His active problem list, medication list and allergies were reviewed.     His interim medical history is significant for ERCP, fairly well controlled DM, weight loss.    Anthropometrics   Estimated body mass index is 28.89 kg/m?? as calculated from the following:    Height as of 02/03/23: 172.7 cm (5' 8).    Weight as of 02/03/23: 86.2 kg (190 lb).    Wt Readings from Last 5 Encounters:   02/03/23 86.2 kg (190 lb)   01/31/23 86.4 kg (190 lb 8 oz)   11/29/22 88.3 kg (194 lb 9.6 oz)   10/11/22 81.2 kg (179 lb)   08/16/22 86.5 kg (190 lb 11.2 oz)      Usual body weight: 185- 190 lbs on home scale yesterday per patient report     ~193 lbs in September 2023    ~201 lb on home scale in June 2023   198 lbs per patient prior to transplant  Ideal Body Weight:   69.9 kg       Nutrition Risk Screening:     Nutrition Focused Physical Exam:    Unable to assess given virtual visit             Malnutrition Screening:   Malnutrition Assessment using AND/ASPEN Clinical Characteristics:     Unable to assess given inability to perform nutrition focused physical exam        Biochemical Data, Medical Tests and Procedures:  All pertinent labs and imaging reviewed by Idolina Primer, RD/LDN at 8:47 AM 02/06/2023.    Patient has been on the [place] 3 and it has been working well. He reports his blood glucoses have been running around 202 over the past 7 days (patient reports having a procedure recently); the average for 30 days is 171. He reports knowing exactly when it is going to be high depending on what he is eating and when he takes his insulin. Denies low blood glucoses frequently, he reports having 3 lows in the past month.       Lab Results   Component Value Date    Hemoglobin A1c 7.3 (H) 02/04/2023    Hemoglobin A1C 7.5 (H) 08/16/2022    Hemoglobin A1C 6.5 (H) 05/06/2022    Hemoglobin A1C 5.2 02/05/2022      No results found for: VITAMINA  Lab Results   Component Value Date    Vitamin D Total (25OH) 43.1 05/06/2022    Vitamin D Total (25OH) 27.9 09/11/2020    Vitamin D Total (25OH) 13.9 (L) 05/25/2019     No results found for: VITAME  No results found for: CRP  No results found for: ZINC  No results found for: COPPER    Lab Results   Component Value Date    BUN 21 02/04/2023    CREATININE 1.72 (H) 02/04/2023    GFRAA 33 (L) 03/22/2022    GFRNONAA 72 04/05/2019    NA 141 02/04/2023    K 4.6 02/04/2023    CL 101 02/04/2023    CO2 23 02/04/2023    CALCIUM 9.7 02/04/2023    PHOS 3.3 11/05/2022    ALBUMIN 3.8 11/29/2022  Medications and Vitamin/Mineral Supplementation:   All nutritionally pertinent medications reviewed on 02/06/2023.   Nutritionally pertinent medications include: novolog, tresiba, Imodium (continues to need this, diarrhea comes and goes), Ozempic    He is not taking nutrition supplements.     Current Outpatient Medications   Medication Sig Dispense Refill    acetaminophen (TYLENOL) 325 MG tablet Take 1-2 tablets (325-650 mg total) by mouth every six (6) hours as needed for pain or fever. 100 tablet 11    amlodipine (NORVASC) 10 MG tablet Take 1 tablet (10 mg total) by mouth daily. 30 tablet 11    aspirin (ECOTRIN) 81 MG tablet Take 1 tablet (81 mg total) by mouth daily. 90 tablet 3    blood sugar diagnostic (ACCU-CHEK GUIDE TEST STRIPS) Strp Use to check blood sugar as directed with insulin 3 times a day & for symptoms of high or low blood sugar 100 strip 11    blood-glucose meter kit Use as instructed 1 each 0    blood-glucose meter Misc Use to test blood sugar as directed 1 each 0    carvedilol (COREG) 6.25 MG tablet Take 2 tablets (12.5 mg total) by mouth two (2) times a day. 360 tablet 3    DULoxetine (CYMBALTA) 30 MG capsule Take 2 capsules (60 mg total) by mouth daily. 180 capsule 3    everolimus, immunosuppressive, (ZORTRESS) 0.5 mg tablet Take 3 tablets (1.5 mg total) by mouth two (2) times a day. 180 tablet 11    gabapentin (NEURONTIN) 300 MG capsule Take 1 capsule (300 mg total) by mouth Three (3) times a day. 90 capsule 0    glucagon spray 3 mg/actuation Spry Use 1 spray into the left nostril every fifteen (15) minutes as needed (low blood sugar). 2 each 1    insulin aspart (NOVOLOG FLEXPEN) 100 unit/mL (3 mL) injection pen Inject under the skin per sliding scale prior to meals. If premeal BG 151-200 take 2 additional units for correction, if 201-250 take 4 additional units, etc. Max dose 36 units per day.  Store in-use prefilled pens at room temperature <86??F and use within 28 days; do not refrigerate. 15 mL 6    insulin degludec (TRESIBA FLEXTOUCH U-100) 100 unit/mL (3 mL) InPn Inject 0.15 mL (15 Units total) under the skin daily. Adjust as instructed. 15 mL 3    lancets (ACCU-CHEK SOFTCLIX LANCETS) Misc Use to check blood sugar as directed with insulin 3 times a day & for symptoms of high or low blood sugar. 100 each 0    levoFLOXacin (LEVAQUIN) 500 MG tablet Take 1 tablet (500 mg total) by mouth daily for 4 days. 4 tablet 0    methocarbamol (ROBAXIN) 500 MG tablet Take 1 tablet (500 mg total) by mouth Three (3) times a day as needed. 90 tablet 0    oxyCODONE (ROXICODONE) 5 MG immediate release tablet Take 1 tablet (5 mg total) by mouth every four (4) hours as needed for pain for up to 5 days. 10 tablet 0    pantoprazole (PROTONIX) 40 MG tablet Take 1 tablet (40 mg total) by mouth daily as needed. 30 tablet 11    pen needle, diabetic (BD ULTRA-FINE NANO PEN NEEDLE) 32 gauge x 5/32 (4 mm) Ndle Inject 1 each under the skin Four (4) times a day with a meal and nightly. Use as directed  1-2 times per day 100 each 11    semaglutide (OZEMPIC) 0.25 mg or 0.5 mg (2 mg/3 mL) PnIj Inject 0.5  mg under the skin every seven (7) days. 6 mL 11 tacrolimus (PROGRAF) 0.5 MG capsule Take 3 capsules (1.5 mg total) by mouth daily AND 2 capsules (1 mg total) nightly. 150 capsule 11     No current facility-administered medications for this visit.       Nutrition History:     Dietary Restrictions: No known food allergies or food intolerances.     Gastrointestinal Issues: Denied issues   Patient reports being started on Imodium several months ago. Patient has continued to have diarrhea on and off, team is investigating potential causes of diarrhea. He is frustrated that he has needed Imodium for so long.    Hunger and Satiety: Denied issues.        Food Safety and Access: No to little issues noted.     Diet Recall:     Time Intake   Breakfast Cereal (Special K) + milk   OR egg and sausage or oatmeal    Snack (AM)    Lunch Protein + vegetable (usually lima beans or green beans)  OR TV dinner when    Snack (PM)    Dinner Cheeseburger and spinach salad    OR air fried chicken, green beans and corn on the cobb   OR roast    Snack (HS)      Food-Related History:     Beverages: 3-4 bottles of water per day, Gatorade every once in a while   Dining Out:  Minimal, patient has been cooking since his wife has been not feeling well   Usual Food Choices: peas, green beans, peanut butter, lima beans, green beans, milk, macaroni & cheese, chicken, brown rice   Does not eat fruit per his report     Physical Activity:  Physical activity level is sedentary with little to no exercise.    He has been trying to walk more since his ERCP. He has hernia pain when sneezing and sometimes when he lays down it hurts.     Daily Estimated Nutrient Needs:  Energy: 1750- 2100 kcals [25- 30 kcal/kg using ideal body weight, 70 kg]    Protein: 55- 70 gm [0.8- 1.0 gm/kg using ideal body weight, 70 kg]  Fluid:   [per MD team]  Sodium:  <2000 mg     Nutrition Goals & Evaluation      1. Meet estimated daily needs (Progressing)  2. Normal vitamin levels (Ongoing)  3. Balanced macronutrient intake (Progressing)  4. Hemoglobin A1c <7% (Met)    Nutrition goals reviewed, and relevant barriers identified and addressed: none evident. He is evaluated to have good willingness and ability to achieve nutrition goals.     Nutrition Assessment     Per the patient's diet recall, nutrition intake is adequate and is meeting estimated nutrition needs. Patient has been able to maintain his weight for the past 6 months. Patient has stopped drinking Boost, which is appropriate. Encouraged patient to continue hydrating well, maintaining his weight and exercising.  Patient with slightly worse glucose control over the past month, but anticipate this is related to him having a procedure. Patient has been on Ozempic.   Patient reports stopping drinking Sundrop once he realized it had grapefruit in it. Encouraged he continue to avoid this for the rest of his life given interaction with the transplant medications.     Nutrition Intervention      Nutrition Education: blood glucose management    Materials Provided were: verbal tips and suggestions  Nutrition Plan:      Eat 4 small, frequent protein containing meals per day   Hydrate appropriately   Increase exercise/safe movement to 30 minutes, 5 days per week   Continue Ozempic     Follow up will occur if needed.     Food/Nutrition-related history and Biochemical data, medical tests, procedures will be assessed at time of follow-up.         Recommendations for Care Team:    Monitor blood glucose levels      Time spent 36 minutes     The patient reports they are physically located in [place] and is currently: at home. I conducted a audio/video visit. I spent  48m 15s on the video call with the patient. I spent an additional 22 minutes on pre- and post-visit activities on the date of service .     I am located on-site and the patient is located off-site for this visit.         Lanelle Bal, RD, LDN  Abdominal Transplant Dietitian   Pager: [phone]

## 2023-02-06 NOTE — Unmapped (Signed)
Patient seen in clinic today for his 1st annual liver txp f/up. He was accompanied by his wife. He reports he has felt well overall, but resumed one of his intermittent diarrhea stints the night before last. Provided him with stool testing supplies to obtain sample in clinic, but he reports having taken imodium last night and prior to this visit, so he was unable to provide a sample.  Patient denies any fever/chills/sob/swelling or dysuria but reports having increased discomfort to two hernias near his incision line that are tender and bulging today. He has appt with hernia center in Sept, but discussed sx of potential bowel incarceration that would require urgent medical evaluation for it. Patient examined by Dr.Deutsch-Link and Moon. With rising lfts and onset of diarrhea, Dr.Moon treated patient with short course of abx on 6/5. Although MRCP did not definitively diagnose a stricture, Dr.Moon reviewed it again with advanced endoscopy team and decided to scheduled ERCP for upcoming Monday to better evaluate for stricture. If no issues noted on the ercp, and nothing found on stool testing, will possibly consider starting budesonide for bowel flares. Patient in agreement with this plan. Spent about 15 min on post-liver transplant health maintenance education. Encouraged patient to establish with a local dermatologist for annual skin surveillance and to re-establish himself with his pcp. His last colonoscopy was done on Dec'23 with repeat recommended in 10 yrs. He completed bone density study today, which has not yet resulted. Encouraged him to obtain RSV, Flu and new Covid booster in the Fall. Will continue to monitor Pancreatic cysts with repeat MRI in June '25. Patient HBV naive, so encouraged him to obtain Heplisav series locally as well. Follow up appointments with SW, Psych, Nutrition and Pharmacy in the next week. Will continue with weekly lab monitoring until lfts improve and obtain stool tests as able. Patient verbalized understanding of all discussed.

## 2023-02-06 NOTE — Unmapped (Unsigned)
POST-TRANSPLANT PSYCHOLOGICAL EVALUATION    Patient Name: Matthew Key  Medical Record Number: [id number]  Date of Service: February 06, 2023  Clinical Psychologist: Deedra Ehrich, PsyD  Evaluation Duration and Procedures:  Clinical interview; record review; case consultation;   Procedure Code(s): [phone]    {    Coding tips - Do not edit this text, it will delete upon signing of note!    Telephone visits [phone] for Physicians and APPs and [phone] for Non- Physician Clinicians)- Only use minutes on the phone to determine level of service.    Video visits [phone]) - Use either level of medical decision making just as an in-person visit OR time which includes both minutes on video and pre/post minutes to determine the level of service.      :[id number]}  The patient reports they are physically located in [place] and is currently: at home. I conducted a phone visit.  I spent 25 minutes on the phone call with the patient on the date of service .       This evaluation note may contain sensitive and confidential information regarding the patient???s psychosocial adjustment to living with a chronic medical condition. DO NOT share this information outside [facility] without written consent from the patient explicitly stating that mental health records may be released.     The limits of confidentiality and the purpose of the evaluation were reviewed. The patient was provided with a verbal description of the nature and purpose of the psychological evaluation. I also reviewed the referral source, specific referral question for this evaluation, foreseeable risks/discomforts, benefits, limits of confidentiality, and mandatory reporting requirements of this provider. The patient was given the opportunity to ask questions and receive answers about the present evaluation. Oral consent was provided by the patient.     BACKGROUND INFORMATION: Mr.  [name] was seen for a post-transplant psychological evaluation. He is a 62 y.o. married Caucasian male from [place], [place]. 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 He was seen today for one year post-transplant follow up.       BEHAVIORAL OBSERVATIONS:   [name] was seen early due to availability in provider schedule. He was interviewed alone. Rapport was easily established. He did not seem motivated to present  himself in an overly favorable light.      MENTAL STATUS EXAM:  Appearance: unable to assess  Motor: unable to assess  Speech/Language: Normal rate, volume, tone, fluency  Mood: good  Affect: Calm and Cooperative  Thought Process: Logical, linear, clear, coherent, goal directed  Thought Content: Denies SI, HI, self harm, delusions, obsessions, paranoid ideation, or ideas of reference  Perceptual Disturbances: Denies auditory and visual hallucinations, behavior not concerning for response to internal stimuli  Orientation: Oriented to person, place, time, and general circumstances  Attention: Able to fully attend without fluctuations in consciousness  Concentration: Able to fully concentrate and attend  Memory: Immediate, short-term, long-term, and recall grossly intact  Fund of Knowledge: Consistent with level of education and development  Insight: Intact  Judgment: Intact  Impulse Control: Intact    INTERVIEW:    Adherence:  Medication Adherence: Good  Medication Concerns: denied problems taking medications, concerns about side effects, affordability, problems obtaining medications, and difficulty remembering medications  Other Adherence: Good       Lifestyle Issues:  Physical activity:  Poor; sedentary  Nutrition/Appetite:  Good  Sleep: Good, but interrupted by diarrhea on occasion  Pain (0=no pain; 10=worst pain imaginable): 5/10 discomfort from ERCP 3 days ago  Pain Medications:  use them as prescribed    Substance Issues:  Pt denied.     Social Issues:  Support/Caregiving Issues: Denied; Pt's wife having a hard time getting around due to cancer  Social Stressors/Changes: Pt had questions about disability benefits as he approaching the one year post transplant. Writer encouraged him to discuss this with SW today at scheduled visit. He also noted some financial stress.     Psychological/Adjustment Issues:    Pt denied any concerns related to his mood/mental health currently. He reflected on the worry he experienced in the months after transplant. He noted this has subsided. He spent time talking about his overall transplant experience and expressed significant gratitude for it.      INTERVENTION: Health and behavior assessment.       PSYCHIATRIC DIAGNOSES:    History of adjustment disorder, resolved                                 IMPRESSIONS, RECOMMENDATIONS, AND PLAN:     Pt reported doing well overall. He reported good adherence to medical directives. He cited some concerns about finances and worry his disability will end as he now one year out from transplant. He will speak with TSW today about this. From a mental health standpoint, pt has been stable and denied any current concerns. He expressed gratitude for the care he has received.     Should the patient or treatment team notice a change in functioning, please refer back to transplant psychology for further evaluation and treatment.

## 2023-02-06 NOTE — Unmapped (Signed)
[facility] Clinical Intervention    Type of intervention: Narrow therapeutic index drug    Medication involved: Tacrolimus    Problem identified: Pt has been taking accord tacrolimus and we only stock sandoz    Intervention performed: Spoke to patient about changing their accord tacrolimus to [employer].  Told the patient to finish the old mfg then start the new one.  Told patient to give the clinic a call to schedule labs when they start the new sandoz brand. PT acknowledged understanding.    Follow-up needed: Pt will call clinic and schedule labs when starting the new mfg    Approximate time spent: 5-10 minutes    Clinical evidence used to support intervention: FDA Orange Book    Result of the intervention: Prevention of an adverse drug event    Tera Helper, [facility]   [facility] Specialty Pharmacist

## 2023-02-06 NOTE — Unmapped (Signed)
The patient reports they are currently: at home. I spent 20 minutes on the real-time audio and video with the patient on the date of service. I spent an additional 10 minutes on pre- and post-visit activities on the date of service.     The patient was physically located in [place] or a state in which I am permitted to provide care. The patient and/or parent/guardian understood that s/he may incur co-pays and cost sharing, and agreed to the telemedicine visit. The visit was reasonable and appropriate under the circumstances given the patient's presentation at the time.    The patient and/or parent/guardian has been advised of the potential risks and limitations of this mode of treatment (including, but not limited to, the absence of in-person examination) and has agreed to be treated using telemedicine. The patient's/patient's family's questions regarding telemedicine have been answered.     If the visit was completed in an ambulatory setting, the patient and/or parent/guardian has also been advised to contact their provider???s office for worsening conditions, and seek emergency medical treatment and/or call 911 if the patient deems either necessary.          **THIS PATIENT WAS NOT SEEN IN PERSON TO MINIMIZE POTENTIAL SPREAD OF COVID-19, PROTECT PATIENTS/PROVIDERS, AND REDUCE PPE UTILIZATION.**    PATIENT NAME: Matthew Key     MR#: [id number]    DOB: 03-Jul-1961    [facility]  CONFIDENTIAL SOCIAL WORK   3 MONTH LIVER POST TRANSPLANT FOLLOW UP      DATE OF EVALUATION: 02/06/2023    INFORMANTS: Patient/Matthew Key    PREFERRED LANGUAGE: English     INTERPRETER UTILIZED: N/A    TXP CARE TEAM:   Post Transplant RN Coordinator: Matthew Key [phone]  Primary Transplant Provider: Varney Key, Matthew Key, Matthew Key, Matthew Key, Matthew Key    REFERRAL INFORMATION:    [name] is a 62 y.o. Caucasian male is s/p transplant for liver transplantation . CSW follows up to assess 3 months.    TRANSPLANT DATE:   02/06/2022 (Liver)    MOST RECENT HOSPITAL ADMISSION (@ [place]):   Previous admit date: 10/11/2022 to 02/03/23    FUTURE APPOINTMENTS (@ [place]):   Future Appointments   Date Time Provider Department Center   02/06/2023 12:00 PM Matthew Key [facility]   02/06/2023  1:00 PM Matthew Key, Matthew Bathe, PhD [facility]   05/08/2023  1:00 PM Matthew Oppenheim, MD [facility]       HOME HEALTH/DME NEEDS AT LAST DC:   HH: N/A   Services: N/A   Contact: n/a  DME: N/A  Other: N/A  Other Remaining:   Ureter stent: No  Staples: No  Surgical drains x0: No  PD Cath: N/A  Central line: N/A    LIFESTYLE:  Physical activity:  Good  Nutrition/Appetite:  Good; continues to improve   Sleep: Good    SOCIAL HISTORY:  Marital Status: married  Lives with: wife  Children/Dependents: adult son   Other Social support: sister; overall, fractured support post transplant   Housing: stable good repair, utilities function, appliances function, and Heat/AC function    CAREGIVING PLAN:  Location:  wife and sister   Support/Caregivers:  fractured relationship - see prior CSW notes   Household tasks/errands:  n/a  Medication:  n/a  Transportation: n/a    COMPLIANCE HISTORY:  Medication Adherence: Excellent  Medication Concerns: denied problems taking medications, concerns about side effects, affordability, problems obtaining medications, and difficulty remembering medications  Other Adherence: Excellent     Side Effects:  frequent urination; per team, continue to monitor     INSURANCE:  [employer] assistance: N/A  Payer/Plan Subscriber Name Rel Member # Group #   [employer] MEDICARE ADV [place], [employer] DA* Self [id number]      [address] [address]     **see below    INCOME:   Continues to receive disability ($[id number]); wife is waiting on [place] retirement benefits. Currently, no income concerns.     **see below    ATTITUDE ABOUT TRANSPLANT:  Expectations: less worry in post phase; letting go of worry   Fears/Concerns: denied; see above   What would you change?:  immunosuppressed population not having to receive care by going through the ED   Rec'd Info on Ltr to Donor Family: yes    MENTAL HEALTH HISTORY: reflective of current   Current issues/mood: denies  Medications: denies  Therapy: denies  SI/HI: denies    SUBSTANCE HISTORY: reflective of current   Tobacco: denies   Alcohol: denies   Illicit Substances: denies   OTC/Supplements: denies     PAIN HISTORY: reflective of current  Current : not taking any prescribed medications; ongoing back pain   Current use of pain medication/pain control: Tylenol PRN    SUMMARY:  Spoke w/ pt at appt time.  Noted to be pleasant and cooperative.  Pt denies any recent issues complaints/concerns.  Feels that things are going relatively well.  States that wife has been unable to work due to health reasons, but is unable to qualify for disability in [place] due to lack of work credits (wife is [place] citizen).  She has applied for her [place] retirement benefits and is waiting for that determination.       Pt had many questions about his ongoing disability.  Discussed general process for disability determination and ongoing review.  Pt is not certain what rotation he was assigned but states that he has had disability for the last several years.  Discussed that while his income is currently the only household income for a family of 2, pt might want to consider a Medicaid application for himself.  Notes that wife is currently receiving Medicaid. Also provided information on requesting a Medicaid deductible.    Pt thanked this CSW for the information and agreed to call w/ any future questions.    F/up per liver protocols.        Matthew Petties, LCSW, CCTSW  Transplant Case Manager/Social Worker  [facility]  Completed: 02/06/23    _

## 2023-02-07 LAB — CMV DNA, QUANTITATIVE, PCR: CMV QUANT: NEGATIVE [IU]/mL

## 2023-02-08 LAB — HBV DNA REALTIME ABBOTT: HEPATITIS B QUANTITATION: NOT DETECTED [IU]/mL

## 2023-02-09 LAB — PHOSPHATIDYLETHANOL (PETH): PHOSPHATIDYLETHANOL (PETH): NEGATIVE ng/mL

## 2023-02-10 DIAGNOSIS — Z5181 Encounter for therapeutic drug level monitoring: Principal | ICD-10-CM

## 2023-02-10 DIAGNOSIS — Z944 Liver transplant status: Principal | ICD-10-CM

## 2023-02-10 DIAGNOSIS — E612 Magnesium deficiency: Principal | ICD-10-CM

## 2023-02-11 NOTE — Unmapped (Signed)
TRF

## 2023-02-11 NOTE — Unmapped (Signed)
Patient seen by PharmD Chargualaf for his annual, who said she restarted the atorvastatin and increased carvedilol to 12.5 mg BID due to SBPs in 140-150 range. She suggested stopping the EVL d/t cost and possible improvement in Cr. Dr. Waynetta Sandy approved of all changes and recommended he be referred to nephrology. Reached out to PharmD to see if Tac goal should remain the same.

## 2023-02-12 LAB — CBC W/ DIFFERENTIAL
BANDED NEUTROPHILS ABSOLUTE COUNT: 0.1 10*3/uL (ref 0.0–0.1)
BASOPHILS ABSOLUTE COUNT: 0 10*3/uL (ref 0.0–0.2)
BASOPHILS RELATIVE PERCENT: 0 %
EOSINOPHILS ABSOLUTE COUNT: 0.1 10*3/uL (ref 0.0–0.4)
EOSINOPHILS RELATIVE PERCENT: 2 %
HEMATOCRIT: 36.8 % — ABNORMAL LOW (ref 37.5–51.0)
HEMOGLOBIN: 11.7 g/dL — ABNORMAL LOW (ref 13.0–17.7)
IMMATURE GRANULOCYTES: 2 %
LYMPHOCYTES ABSOLUTE COUNT: 1.2 10*3/uL (ref 0.7–3.1)
LYMPHOCYTES RELATIVE PERCENT: 16 %
MEAN CORPUSCULAR HEMOGLOBIN CONC: 31.8 g/dL (ref 31.5–35.7)
MEAN CORPUSCULAR HEMOGLOBIN: 24.4 pg — ABNORMAL LOW (ref 26.6–33.0)
MEAN CORPUSCULAR VOLUME: 77 fL — ABNORMAL LOW (ref 79–97)
MONOCYTES ABSOLUTE COUNT: 0.7 10*3/uL (ref 0.1–0.9)
MONOCYTES RELATIVE PERCENT: 9 %
NEUTROPHILS ABSOLUTE COUNT: 5.4 10*3/uL (ref 1.4–7.0)
NEUTROPHILS RELATIVE PERCENT: 71 %
PLATELET COUNT: 164 10*3/uL (ref 150–450)
RED BLOOD CELL COUNT: 4.79 x10E6/uL (ref 4.14–5.80)
RED CELL DISTRIBUTION WIDTH: 14.3 % (ref 11.6–15.4)
WHITE BLOOD CELL COUNT: 7.6 10*3/uL (ref 3.4–10.8)

## 2023-02-12 LAB — COMPREHENSIVE METABOLIC PANEL
ALBUMIN: 3.8 g/dL — ABNORMAL LOW (ref 3.9–4.9)
ALKALINE PHOSPHATASE: 204 IU/L — ABNORMAL HIGH (ref 44–121)
ALT (SGPT): 38 IU/L (ref 0–44)
AST (SGOT): 28 IU/L (ref 0–40)
BILIRUBIN TOTAL (MG/DL) IN SER/PLAS: 0.4 mg/dL (ref 0.0–1.2)
BLOOD UREA NITROGEN: 28 mg/dL — ABNORMAL HIGH (ref 8–27)
BUN / CREAT RATIO: 18 (ref 10–24)
CALCIUM: 8.9 mg/dL (ref 8.6–10.2)
CHLORIDE: 100 mmol/L (ref 96–106)
CO2: 23 mmol/L (ref 20–29)
CREATININE: 1.59 mg/dL — ABNORMAL HIGH (ref 0.76–1.27)
EGFR: 48 mL/min/{1.73_m2} — ABNORMAL LOW
GLOBULIN, TOTAL: 2.4 g/dL (ref 1.5–4.5)
GLUCOSE: 188 mg/dL — ABNORMAL HIGH (ref 70–99)
POTASSIUM: 4.4 mmol/L (ref 3.5–5.2)
SODIUM: 136 mmol/L (ref 134–144)
TOTAL PROTEIN: 6.2 g/dL (ref 6.0–8.5)

## 2023-02-12 LAB — MAGNESIUM: MAGNESIUM: 1.9 mg/dL (ref 1.6–2.3)

## 2023-02-12 LAB — IGA: GAMMAGLOBULIN; IGA: 103 mg/dL (ref 61–437)

## 2023-02-12 LAB — GAMMA GT: GAMMA GLUTAMYL TRANSFERASE: 67 IU/L — ABNORMAL HIGH (ref 0–65)

## 2023-02-12 LAB — BILIRUBIN, DIRECT: BILIRUBIN DIRECT: 0.14 mg/dL (ref 0.00–0.40)

## 2023-02-12 LAB — PHOSPHORUS: PHOSPHORUS, SERUM: 3.3 mg/dL (ref 2.8–4.1)

## 2023-02-12 NOTE — Unmapped (Signed)
Received notification from Dr.Moon and PharmD Chargualaf that patient's EVL should be stopped d/t expense, which should also improve his Cr as well. Per PharmD Christena Deem, new Tac Goal: 4-6. Also, received notice from patient that he repeated labs yesterday and provided stool for testing, since his diarrhea recurred. Spoke to patient and relayed recommendations. He verbalized understanding and said that the diarrhea resolved today. Let him know that if testing was negative for pathogens, then we could discuss more aggressive use of loperamide. He verbalized understanding and agreed to repeat labs weekly x 2 to assure lfts remain stable and Tac is withing goal after stopping the EVL.

## 2023-02-13 LAB — 25-HYDROXYVITAMIN D LCMS D2+D3
25-HYDROXY, VITAMIN D-2: <1 ng/mL
25-HYDROXY, VITAMIN D-3: 48 ng/mL
25-HYDROXY, VITAMIN D: 48 ng/mL

## 2023-02-13 LAB — TISSUE TRANSGLUTAMINASE (TTG), IGA: TISSUE TRANSGLUTAMINASE ANTIBODY: <2 U/mL (ref 0–3)

## 2023-02-13 LAB — TACROLIMUS LEVEL: TACROLIMUS BLOOD: 3.5 ng/mL (ref 2.0–20.0)

## 2023-02-14 DIAGNOSIS — Z944 Liver transplant status: Principal | ICD-10-CM

## 2023-02-14 DIAGNOSIS — Z796 Long-term use of immunosuppressant medication: Principal | ICD-10-CM

## 2023-02-14 LAB — EVEROLIMUS: EVEROLIMUS LEVEL: 6.6 ng/mL (ref 3.0–8.0)

## 2023-02-14 MED ORDER — TACROLIMUS 0.5 MG CAPSULE, IMMEDIATE-RELEASE
ORAL_CAPSULE | Freq: Two times a day (BID) | ORAL | 11 refills | 30 days | Status: CP
Start: 2023-02-14 — End: ?
  Filled 2023-03-04: qty 180, 30d supply, fill #0

## 2023-02-14 NOTE — Unmapped (Signed)
Patient's Tac level for 7/2 was again below her new Tac goal. Discussed with PharmD Chargualaf, who recommended increasing his dose to 1.5mg  bid. Spoke with patient and relayed new dose recommendation. He verbalized understanding and said he stopped taking his EVL last night. He agreed to repeat labs weekly x 2, beginning the week after next.

## 2023-02-17 DIAGNOSIS — D849 Immunodeficiency, unspecified: Principal | ICD-10-CM

## 2023-02-17 DIAGNOSIS — R197 Diarrhea, unspecified: Principal | ICD-10-CM

## 2023-02-17 DIAGNOSIS — E612 Magnesium deficiency: Principal | ICD-10-CM

## 2023-02-17 DIAGNOSIS — Z5181 Encounter for therapeutic drug level monitoring: Principal | ICD-10-CM

## 2023-02-17 DIAGNOSIS — Z944 Liver transplant status: Principal | ICD-10-CM

## 2023-02-17 LAB — SPECIMEN STATUS REPORT

## 2023-02-17 NOTE — Unmapped (Signed)
Patient left MyChart msg for [facility], reporting that his diarrhea has worsened. Noted the stool cx had resulted from [facility], but no CDiff. After verifying through [facility] that no CDiff in process, contacted them and placed add-on to 7/2 sample. Spoke with patient who reported he had been drinking electrolyte replacements d/t qty of diarrhea. He said he collected another sample and was taking it into the lab today. Suggested he repeat labs as well, instead of waiting until next week, to monitor his electrolytes and Cr. He confirmed he has been able to take all his meds as directed and agreed to have blood drawn as well. Encouraged him to take an imodium daily, even though, he believes it has not helped at all. Explained that TNC would call him on results. He verbalized understanding.

## 2023-02-17 NOTE — Unmapped (Addendum)
[facility] Pharmacist has reviewed a new prescription for tacrolimus that indicates a dose increase.  Patient was counseled on this dosage change by [facility]- see epic note from 02/14/23.  Next refill call date adjusted if necessary.        Clinical Assessment Needed For: Dose Change  Medication: Tacrolimus 0.5mg  capsule  Last Fill Date/Day Supply: 02/06/2023 / 30 days  Refill Too Soon until 02/25/2023  Was previous dose already scheduled to fill: No    Notes to Pharmacist: Will re-test on 07/16

## 2023-02-18 LAB — COMPREHENSIVE METABOLIC PANEL
ALBUMIN: 3.7 g/dL — ABNORMAL LOW (ref 3.9–4.9)
ALKALINE PHOSPHATASE: 186 IU/L — ABNORMAL HIGH (ref 44–121)
ALT (SGPT): 21 IU/L (ref 0–44)
AST (SGOT): 17 IU/L (ref 0–40)
BILIRUBIN TOTAL (MG/DL) IN SER/PLAS: 0.6 mg/dL (ref 0.0–1.2)
BLOOD UREA NITROGEN: 12 mg/dL (ref 8–27)
BUN / CREAT RATIO: 7 — ABNORMAL LOW (ref 10–24)
CALCIUM: 9.2 mg/dL (ref 8.6–10.2)
CHLORIDE: 102 mmol/L (ref 96–106)
CO2: 22 mmol/L (ref 20–29)
CREATININE: 1.7 mg/dL — ABNORMAL HIGH (ref 0.76–1.27)
EGFR: 45 mL/min/{1.73_m2} — ABNORMAL LOW
GLOBULIN, TOTAL: 2.3 g/dL (ref 1.5–4.5)
GLUCOSE: 185 mg/dL — ABNORMAL HIGH (ref 70–99)
POTASSIUM: 5.1 mmol/L (ref 3.5–5.2)
SODIUM: 137 mmol/L (ref 134–144)
TOTAL PROTEIN: 6 g/dL (ref 6.0–8.5)

## 2023-02-18 LAB — CBC W/ DIFFERENTIAL
BANDED NEUTROPHILS ABSOLUTE COUNT: 0 10*3/uL (ref 0.0–0.1)
BASOPHILS ABSOLUTE COUNT: 0 10*3/uL (ref 0.0–0.2)
BASOPHILS RELATIVE PERCENT: 0 %
EOSINOPHILS ABSOLUTE COUNT: 0.2 10*3/uL (ref 0.0–0.4)
EOSINOPHILS RELATIVE PERCENT: 2 %
HEMATOCRIT: 38.2 % (ref 37.5–51.0)
HEMOGLOBIN: 11.6 g/dL — ABNORMAL LOW (ref 13.0–17.7)
IMMATURE GRANULOCYTES: 0 %
LYMPHOCYTES ABSOLUTE COUNT: 1.1 10*3/uL (ref 0.7–3.1)
LYMPHOCYTES RELATIVE PERCENT: 12 %
MEAN CORPUSCULAR HEMOGLOBIN CONC: 30.4 g/dL — ABNORMAL LOW (ref 31.5–35.7)
MEAN CORPUSCULAR HEMOGLOBIN: 24.5 pg — ABNORMAL LOW (ref 26.6–33.0)
MEAN CORPUSCULAR VOLUME: 81 fL (ref 79–97)
MONOCYTES ABSOLUTE COUNT: 0.6 10*3/uL (ref 0.1–0.9)
MONOCYTES RELATIVE PERCENT: 7 %
NEUTROPHILS ABSOLUTE COUNT: 7.3 10*3/uL — ABNORMAL HIGH (ref 1.4–7.0)
NEUTROPHILS RELATIVE PERCENT: 79 %
PLATELET COUNT: 170 10*3/uL (ref 150–450)
RED BLOOD CELL COUNT: 4.73 x10E6/uL (ref 4.14–5.80)
RED CELL DISTRIBUTION WIDTH: 14.8 % (ref 11.6–15.4)
WHITE BLOOD CELL COUNT: 9.3 10*3/uL (ref 3.4–10.8)

## 2023-02-18 LAB — PHOSPHORUS: PHOSPHORUS, SERUM: 3.5 mg/dL (ref 2.8–4.1)

## 2023-02-18 LAB — GAMMA GT: GAMMA GLUTAMYL TRANSFERASE: 48 IU/L (ref 0–65)

## 2023-02-18 LAB — BILIRUBIN, DIRECT: BILIRUBIN DIRECT: 0.18 mg/dL (ref 0.00–0.40)

## 2023-02-18 LAB — MAGNESIUM: MAGNESIUM: 1.8 mg/dL (ref 1.6–2.3)

## 2023-02-18 NOTE — Unmapped (Signed)
Received notification from [facility] today that they could not add the CDiff testing to last week's sample. Spoke to patient, who confirmed he took in a new specimen yesterday. Confirmed with [facility], that new specimen was received and is now in process. Patient aware that he will be contacted when results are finalized.

## 2023-02-19 DIAGNOSIS — K529 Noninfective gastroenteritis and colitis, unspecified: Principal | ICD-10-CM

## 2023-02-19 DIAGNOSIS — Z944 Liver transplant status: Principal | ICD-10-CM

## 2023-02-19 DIAGNOSIS — R197 Diarrhea, unspecified: Principal | ICD-10-CM

## 2023-02-19 LAB — TACROLIMUS LEVEL: TACROLIMUS BLOOD: 4.8 ng/mL (ref 2.0–20.0)

## 2023-02-19 MED ORDER — LOPERAMIDE 2 MG CAPSULE
ORAL_CAPSULE | ORAL | 0 refills | 4 days | Status: CP | PRN
Start: 2023-02-19 — End: 2023-03-21

## 2023-02-19 MED ORDER — BUDESONIDE DR - ER 3 MG CAPSULE,DELAYED,EXTENDED RELEASE
ORAL_CAPSULE | Freq: Every day | ORAL | 2 refills | 30 days | Status: CP
Start: 2023-02-19 — End: 2024-02-19

## 2023-02-20 NOTE — Unmapped (Signed)
Patient's 7/8 CDiff resulted negative, making all stool tests negative for infection. 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 He approved of his using imodium, up to 16mg  daily.   Spoke with patient and relayed Dr.Moon's recommendation for budesonide. He said his diarrhea stopped yesterday, then restarted today. Explained that he can take 2- 2mg  imodium capsules on day of diarrhea onset and take an additional 2mg  cap every hour, if diarrhea continues, with a maximum dose of 8 capsules (16mg ) in a 24 hr period.  Patient verbalized understanding and agreed to report back to TNC in 3 weeks.

## 2023-02-21 DIAGNOSIS — Z944 Liver transplant status: Principal | ICD-10-CM

## 2023-02-24 DIAGNOSIS — Z944 Liver transplant status: Principal | ICD-10-CM

## 2023-02-24 DIAGNOSIS — E612 Magnesium deficiency: Principal | ICD-10-CM

## 2023-02-24 DIAGNOSIS — Z5181 Encounter for therapeutic drug level monitoring: Principal | ICD-10-CM

## 2023-02-25 NOTE — Unmapped (Signed)
Therapy Update Follow Up: No issues - Copay = $9.04

## 2023-02-28 DIAGNOSIS — E139 Other specified diabetes mellitus without complications: Principal | ICD-10-CM

## 2023-02-28 DIAGNOSIS — M25531 Pain in right wrist: Principal | ICD-10-CM

## 2023-02-28 MED ORDER — GABAPENTIN 300 MG CAPSULE
ORAL_CAPSULE | Freq: Three times a day (TID) | ORAL | 0 refills | 30.00000 days | Status: CN
Start: 2023-02-28 — End: ?
  Filled 2023-03-04: qty 90, 30d supply, fill #0

## 2023-02-28 MED ORDER — INSULIN ASPART (U-100) 100 UNIT/ML (3 ML) SUBCUTANEOUS PEN
6 refills | 0.00000 days | Status: CN
Start: 2023-02-28 — End: ?
  Filled 2023-03-04: qty 15, 41d supply, fill #0

## 2023-02-28 NOTE — Unmapped (Signed)
The patient is requesting a medication refill

## 2023-02-28 NOTE — Unmapped (Signed)
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     Was a Nurse, learning disability used for this call? No    Completed refill call assessment today to schedule patient's medication shipment from the [facility] [phone]).  All relevant notes have been reviewed.     Specialty medication(s) and dose(s) confirmed: Regimen is correct and unchanged.   Changes to medications: Meer reports no changes at this time.  Changes to insurance: No  New side effects reported not previously addressed with a pharmacist or physician: None reported  Questions for the pharmacist: No    Confirmed patient received a Conservation officer, historic buildings and a Surveyor, mining with first shipment. The patient will receive a drug information handout for each medication shipped and additional FDA Medication Guides as required.       DISEASE/MEDICATION-SPECIFIC INFORMATION        N/A    SPECIALTY MEDICATION ADHERENCE     Medication Adherence    Patient reported X missed doses in the last month: 0  Specialty Medication: tacrolimus 0.5 MG capsule (PROGRAF)  Patient is on additional specialty medications: No              Were doses missed due to medication being on hold? No    tacrolimus 0.5 mg: 7 days of medicine on hand       REFERRAL TO PHARMACIST     Referral to the pharmacist: Not needed      [facility]     Shipping address confirmed in Epic.       Delivery Scheduled: Yes, Expected medication delivery date: 03/05/23.     Medication will be delivered via UPS to the prescription address in Epic [place].    [name]   [facility] Specialty Technician

## 2023-03-03 DIAGNOSIS — Z5181 Encounter for therapeutic drug level monitoring: Principal | ICD-10-CM

## 2023-03-03 DIAGNOSIS — Z944 Liver transplant status: Principal | ICD-10-CM

## 2023-03-03 DIAGNOSIS — E612 Magnesium deficiency: Principal | ICD-10-CM

## 2023-03-04 LAB — COMPREHENSIVE METABOLIC PANEL
ALBUMIN: 3.1 g/dL — ABNORMAL LOW (ref 3.9–4.9)
ALKALINE PHOSPHATASE: 215 IU/L — ABNORMAL HIGH (ref 44–121)
ALT (SGPT): 17 IU/L (ref 0–44)
AST (SGOT): 17 IU/L (ref 0–40)
BILIRUBIN TOTAL (MG/DL) IN SER/PLAS: 0.5 mg/dL (ref 0.0–1.2)
BLOOD UREA NITROGEN: 17 mg/dL (ref 8–27)
BUN / CREAT RATIO: 11 (ref 10–24)
CALCIUM: 8.9 mg/dL (ref 8.6–10.2)
CHLORIDE: 106 mmol/L (ref 96–106)
CO2: 24 mmol/L (ref 20–29)
CREATININE: 1.61 mg/dL — ABNORMAL HIGH (ref 0.76–1.27)
EGFR: 48 mL/min/{1.73_m2} — ABNORMAL LOW
GLOBULIN, TOTAL: 2.6 g/dL (ref 1.5–4.5)
GLUCOSE: 118 mg/dL — ABNORMAL HIGH (ref 70–99)
POTASSIUM: 5.7 mmol/L — ABNORMAL HIGH (ref 3.5–5.2)
SODIUM: 142 mmol/L (ref 134–144)
TOTAL PROTEIN: 5.7 g/dL — ABNORMAL LOW (ref 6.0–8.5)

## 2023-03-04 LAB — CBC W/ DIFFERENTIAL
BANDED NEUTROPHILS ABSOLUTE COUNT: 0.1 10*3/uL (ref 0.0–0.1)
BASOPHILS ABSOLUTE COUNT: 0 10*3/uL (ref 0.0–0.2)
BASOPHILS RELATIVE PERCENT: 0 %
EOSINOPHILS ABSOLUTE COUNT: 0.3 10*3/uL (ref 0.0–0.4)
EOSINOPHILS RELATIVE PERCENT: 4 %
HEMATOCRIT: 36.5 % — ABNORMAL LOW (ref 37.5–51.0)
HEMOGLOBIN: 11.2 g/dL — ABNORMAL LOW (ref 13.0–17.7)
IMMATURE GRANULOCYTES: 1 %
LYMPHOCYTES ABSOLUTE COUNT: 1.4 10*3/uL (ref 0.7–3.1)
LYMPHOCYTES RELATIVE PERCENT: 17 %
MEAN CORPUSCULAR HEMOGLOBIN CONC: 30.7 g/dL — ABNORMAL LOW (ref 31.5–35.7)
MEAN CORPUSCULAR HEMOGLOBIN: 24.1 pg — ABNORMAL LOW (ref 26.6–33.0)
MEAN CORPUSCULAR VOLUME: 79 fL (ref 79–97)
MONOCYTES ABSOLUTE COUNT: 0.7 10*3/uL (ref 0.1–0.9)
MONOCYTES RELATIVE PERCENT: 8 %
NEUTROPHILS ABSOLUTE COUNT: 5.8 10*3/uL (ref 1.4–7.0)
NEUTROPHILS RELATIVE PERCENT: 70 %
PLATELET COUNT: 188 10*3/uL (ref 150–450)
RED BLOOD CELL COUNT: 4.65 x10E6/uL (ref 4.14–5.80)
RED CELL DISTRIBUTION WIDTH: 15.6 % — ABNORMAL HIGH (ref 11.6–15.4)
WHITE BLOOD CELL COUNT: 8.3 10*3/uL (ref 3.4–10.8)

## 2023-03-04 LAB — MAGNESIUM: MAGNESIUM: 2 mg/dL (ref 1.6–2.3)

## 2023-03-04 LAB — GAMMA GT: GAMMA GLUTAMYL TRANSFERASE: 57 IU/L (ref 0–65)

## 2023-03-04 LAB — BILIRUBIN, DIRECT: BILIRUBIN DIRECT: 0.19 mg/dL (ref 0.00–0.40)

## 2023-03-04 LAB — PHOSPHORUS: PHOSPHORUS, SERUM: 4.6 mg/dL — ABNORMAL HIGH (ref 2.8–4.1)

## 2023-03-05 DIAGNOSIS — E875 Hyperkalemia: Principal | ICD-10-CM

## 2023-03-05 LAB — TACROLIMUS LEVEL: TACROLIMUS BLOOD: 7.3 ng/mL (ref 2.0–20.0)

## 2023-03-05 NOTE — Unmapped (Signed)
Patient's 7/22 labs with K-5.7 and tac above goal. Reached out to patient, who confirmed he was taking the correct Tac dose and that his level was reliable. He denied sx of chest pain, palpitation or sob. He confirmed he has some remaining 8.4 g Veltassa and agreed to take a dose today and tomorrow and repeat labs again next week.

## 2023-03-10 DIAGNOSIS — Z5181 Encounter for therapeutic drug level monitoring: Principal | ICD-10-CM

## 2023-03-10 DIAGNOSIS — Z944 Liver transplant status: Principal | ICD-10-CM

## 2023-03-10 DIAGNOSIS — E612 Magnesium deficiency: Principal | ICD-10-CM

## 2023-03-11 DIAGNOSIS — L508 Other urticaria: Secondary | ICD-10-CM | POA: Insufficient documentation

## 2023-03-17 ENCOUNTER — Ambulatory Visit: Admit: 2023-03-17 | Discharge: 2023-03-18 | Payer: MEDICARE

## 2023-03-17 DIAGNOSIS — Z944 Liver transplant status: Principal | ICD-10-CM

## 2023-03-17 DIAGNOSIS — E612 Magnesium deficiency: Principal | ICD-10-CM

## 2023-03-17 DIAGNOSIS — Z5181 Encounter for therapeutic drug level monitoring: Principal | ICD-10-CM

## 2023-03-18 NOTE — Unmapped (Signed)
Labs drawn today. HBV not believed to be drawn.

## 2023-03-19 LAB — CBC W/ DIFFERENTIAL
BANDED NEUTROPHILS ABSOLUTE COUNT: 0 10*3/uL (ref 0.0–0.1)
BASOPHILS ABSOLUTE COUNT: 0 10*3/uL (ref 0.0–0.2)
BASOPHILS RELATIVE PERCENT: 0 %
EOSINOPHILS ABSOLUTE COUNT: 0.2 10*3/uL (ref 0.0–0.4)
EOSINOPHILS RELATIVE PERCENT: 3 %
HEMATOCRIT: 37.1 % — ABNORMAL LOW (ref 37.5–51.0)
HEMOGLOBIN: 11.6 g/dL — ABNORMAL LOW (ref 13.0–17.7)
IMMATURE GRANULOCYTES: 0 %
LYMPHOCYTES ABSOLUTE COUNT: 1.4 10*3/uL (ref 0.7–3.1)
LYMPHOCYTES RELATIVE PERCENT: 21 %
MEAN CORPUSCULAR HEMOGLOBIN CONC: 31.3 g/dL — ABNORMAL LOW (ref 31.5–35.7)
MEAN CORPUSCULAR HEMOGLOBIN: 25.7 pg — ABNORMAL LOW (ref 26.6–33.0)
MEAN CORPUSCULAR VOLUME: 82 fL (ref 79–97)
MONOCYTES ABSOLUTE COUNT: 0.7 10*3/uL (ref 0.1–0.9)
MONOCYTES RELATIVE PERCENT: 10 %
NEUTROPHILS ABSOLUTE COUNT: 4.4 10*3/uL (ref 1.4–7.0)
NEUTROPHILS RELATIVE PERCENT: 66 %
PLATELET COUNT: 189 10*3/uL (ref 150–450)
RED BLOOD CELL COUNT: 4.52 x10E6/uL (ref 4.14–5.80)
RED CELL DISTRIBUTION WIDTH: 16.9 % — ABNORMAL HIGH (ref 11.6–15.4)
WHITE BLOOD CELL COUNT: 6.8 10*3/uL (ref 3.4–10.8)

## 2023-03-19 LAB — COMPREHENSIVE METABOLIC PANEL
ALBUMIN: 3.7 g/dL — ABNORMAL LOW (ref 3.9–4.9)
ALKALINE PHOSPHATASE: 178 IU/L — ABNORMAL HIGH (ref 44–121)
ALT (SGPT): 26 IU/L (ref 0–44)
AST (SGOT): 19 IU/L (ref 0–40)
BILIRUBIN TOTAL (MG/DL) IN SER/PLAS: 0.3 mg/dL (ref 0.0–1.2)
BLOOD UREA NITROGEN: 19 mg/dL (ref 8–27)
BUN / CREAT RATIO: 11 (ref 10–24)
CALCIUM: 9.1 mg/dL (ref 8.6–10.2)
CHLORIDE: 104 mmol/L (ref 96–106)
CO2: 23 mmol/L (ref 20–29)
CREATININE: 1.69 mg/dL — ABNORMAL HIGH (ref 0.76–1.27)
EGFR: 45 mL/min/{1.73_m2} — ABNORMAL LOW
GLOBULIN, TOTAL: 2.3 g/dL (ref 1.5–4.5)
GLUCOSE: 131 mg/dL — ABNORMAL HIGH (ref 70–99)
POTASSIUM: 5 mmol/L (ref 3.5–5.2)
SODIUM: 139 mmol/L (ref 134–144)
TOTAL PROTEIN: 6 g/dL (ref 6.0–8.5)

## 2023-03-19 LAB — BILIRUBIN, DIRECT: BILIRUBIN DIRECT: 0.15 mg/dL (ref 0.00–0.40)

## 2023-03-19 LAB — GAMMA GT: GAMMA GLUTAMYL TRANSFERASE: 47 IU/L (ref 0–65)

## 2023-03-19 LAB — PHOSPHORUS: PHOSPHORUS, SERUM: 4.3 mg/dL — ABNORMAL HIGH (ref 2.8–4.1)

## 2023-03-19 LAB — MAGNESIUM: MAGNESIUM: 1.9 mg/dL (ref 1.6–2.3)

## 2023-03-20 LAB — TACROLIMUS LEVEL: TACROLIMUS BLOOD: 4.2 ng/mL (ref 2.0–20.0)

## 2023-03-21 LAB — HBV QUANT PCR RFX TO GENOTYPE

## 2023-03-24 DIAGNOSIS — Z5181 Encounter for therapeutic drug level monitoring: Principal | ICD-10-CM

## 2023-03-24 DIAGNOSIS — E612 Magnesium deficiency: Principal | ICD-10-CM

## 2023-03-24 DIAGNOSIS — Z944 Liver transplant status: Principal | ICD-10-CM

## 2023-03-27 DIAGNOSIS — M25531 Pain in right wrist: Principal | ICD-10-CM

## 2023-03-27 MED ORDER — GABAPENTIN 300 MG CAPSULE
ORAL_CAPSULE | Freq: Three times a day (TID) | ORAL | 0 refills | 30 days
Start: 2023-03-27 — End: ?

## 2023-03-27 NOTE — Unmapped (Signed)
Pt request for RX Refill

## 2023-03-27 NOTE — Unmapped (Signed)
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     Was a Nurse, learning disability used for this call? No    Completed refill call assessment today to schedule patient's medication shipment from the [facility] [phone]).  All relevant notes have been reviewed.     Specialty medication(s) and dose(s) confirmed: Regimen is correct and unchanged.   Changes to medications: [name] reports no changes at this time.  Changes to insurance: No  New side effects reported not previously addressed with a pharmacist or physician: None reported  Questions for the pharmacist: No    Confirmed patient received a Conservation officer, historic buildings and a Surveyor, mining with first shipment. The patient will receive a drug information handout for each medication shipped and additional FDA Medication Guides as required.       DISEASE/MEDICATION-SPECIFIC INFORMATION        N/A    SPECIALTY MEDICATION ADHERENCE     Medication Adherence    Specialty Medication: tacrolimus 0.5 MG capsule (PROGRAF)  Patient is on additional specialty medications: No              Were doses missed due to medication being on hold? No      tacrolimus 0.5 MG capsule (PROGRAF): 7 days of medicine on hand       REFERRAL TO PHARMACIST     Referral to the pharmacist: Not needed      [facility]     Shipping address confirmed in Epic.       Delivery Scheduled: Yes, Expected medication delivery date: 04/03/2023.     Medication will be delivered via UPS to the prescription address in Epic [place].    Matthew Key   [facility] Specialty Technician

## 2023-03-31 DIAGNOSIS — E612 Magnesium deficiency: Principal | ICD-10-CM

## 2023-03-31 DIAGNOSIS — Z5181 Encounter for therapeutic drug level monitoring: Principal | ICD-10-CM

## 2023-03-31 DIAGNOSIS — Z944 Liver transplant status: Principal | ICD-10-CM

## 2023-04-02 MED FILL — TACROLIMUS 0.5 MG CAPSULE, IMMEDIATE-RELEASE: ORAL | 30 days supply | Qty: 180 | Fill #1

## 2023-04-04 LAB — CBC W/ DIFFERENTIAL
BANDED NEUTROPHILS ABSOLUTE COUNT: 0 10*3/uL (ref 0.0–0.1)
BASOPHILS ABSOLUTE COUNT: 0 10*3/uL (ref 0.0–0.2)
BASOPHILS RELATIVE PERCENT: 0 %
EOSINOPHILS ABSOLUTE COUNT: 0.2 10*3/uL (ref 0.0–0.4)
EOSINOPHILS RELATIVE PERCENT: 2 %
HEMATOCRIT: 41.1 % (ref 37.5–51.0)
HEMOGLOBIN: 13.2 g/dL (ref 13.0–17.7)
IMMATURE GRANULOCYTES: 0 %
LYMPHOCYTES ABSOLUTE COUNT: 1.5 10*3/uL (ref 0.7–3.1)
LYMPHOCYTES RELATIVE PERCENT: 20 %
MEAN CORPUSCULAR HEMOGLOBIN CONC: 32.1 g/dL (ref 31.5–35.7)
MEAN CORPUSCULAR HEMOGLOBIN: 25.8 pg — ABNORMAL LOW (ref 26.6–33.0)
MEAN CORPUSCULAR VOLUME: 80 fL (ref 79–97)
MONOCYTES ABSOLUTE COUNT: 0.7 10*3/uL (ref 0.1–0.9)
MONOCYTES RELATIVE PERCENT: 9 %
NEUTROPHILS ABSOLUTE COUNT: 5.3 10*3/uL (ref 1.4–7.0)
NEUTROPHILS RELATIVE PERCENT: 69 %
PLATELET COUNT: 172 10*3/uL (ref 150–450)
RED BLOOD CELL COUNT: 5.11 x10E6/uL (ref 4.14–5.80)
RED CELL DISTRIBUTION WIDTH: 16.7 % — ABNORMAL HIGH (ref 11.6–15.4)
WHITE BLOOD CELL COUNT: 7.8 10*3/uL (ref 3.4–10.8)

## 2023-04-04 LAB — PHOSPHORUS: PHOSPHORUS, SERUM: 4.5 mg/dL — ABNORMAL HIGH (ref 2.8–4.1)

## 2023-04-04 LAB — COMPREHENSIVE METABOLIC PANEL
ALBUMIN: 4 g/dL (ref 3.9–4.9)
ALKALINE PHOSPHATASE: 224 IU/L — ABNORMAL HIGH (ref 44–121)
ALT (SGPT): 26 IU/L (ref 0–44)
AST (SGOT): 16 IU/L (ref 0–40)
BILIRUBIN TOTAL (MG/DL) IN SER/PLAS: 0.6 mg/dL (ref 0.0–1.2)
BLOOD UREA NITROGEN: 21 mg/dL (ref 8–27)
BUN / CREAT RATIO: 12 (ref 10–24)
CALCIUM: 9.4 mg/dL (ref 8.6–10.2)
CHLORIDE: 102 mmol/L (ref 96–106)
CO2: 24 mmol/L (ref 20–29)
CREATININE: 1.77 mg/dL — ABNORMAL HIGH (ref 0.76–1.27)
EGFR: 43 mL/min/{1.73_m2} — ABNORMAL LOW
GLOBULIN, TOTAL: 2.9 g/dL (ref 1.5–4.5)
GLUCOSE: 140 mg/dL — ABNORMAL HIGH (ref 70–99)
POTASSIUM: 5 mmol/L (ref 3.5–5.2)
SODIUM: 139 mmol/L (ref 134–144)
TOTAL PROTEIN: 6.9 g/dL (ref 6.0–8.5)

## 2023-04-04 LAB — BILIRUBIN, DIRECT: BILIRUBIN DIRECT: 0.18 mg/dL (ref 0.00–0.40)

## 2023-04-04 LAB — MAGNESIUM: MAGNESIUM: 2 mg/dL (ref 1.6–2.3)

## 2023-04-04 LAB — GAMMA GT: GAMMA GLUTAMYL TRANSFERASE: 50 IU/L (ref 0–65)

## 2023-04-04 MED ORDER — GABAPENTIN 300 MG CAPSULE
ORAL_CAPSULE | Freq: Three times a day (TID) | ORAL | 0 refills | 30 days
Start: 2023-04-04 — End: ?

## 2023-04-05 NOTE — Unmapped (Signed)
Please forward to patient's local PCP or Dr. Ceasar Mons with [facility].

## 2023-04-06 LAB — TACROLIMUS LEVEL: TACROLIMUS BLOOD: 4.3 ng/mL (ref 2.0–20.0)

## 2023-04-07 DIAGNOSIS — Z944 Liver transplant status: Principal | ICD-10-CM

## 2023-04-07 DIAGNOSIS — E612 Magnesium deficiency: Principal | ICD-10-CM

## 2023-04-07 DIAGNOSIS — Z5181 Encounter for therapeutic drug level monitoring: Principal | ICD-10-CM

## 2023-04-10 LAB — SPECIMEN STATUS REPORT

## 2023-04-14 DIAGNOSIS — Z944 Liver transplant status: Principal | ICD-10-CM

## 2023-04-14 DIAGNOSIS — Z5181 Encounter for therapeutic drug level monitoring: Principal | ICD-10-CM

## 2023-04-14 DIAGNOSIS — E612 Magnesium deficiency: Principal | ICD-10-CM

## 2023-04-21 DIAGNOSIS — Z944 Liver transplant status: Principal | ICD-10-CM

## 2023-04-21 DIAGNOSIS — E612 Magnesium deficiency: Principal | ICD-10-CM

## 2023-04-21 DIAGNOSIS — Z5181 Encounter for therapeutic drug level monitoring: Principal | ICD-10-CM

## 2023-04-28 DIAGNOSIS — Z944 Liver transplant status: Principal | ICD-10-CM

## 2023-04-28 DIAGNOSIS — Z5181 Encounter for therapeutic drug level monitoring: Principal | ICD-10-CM

## 2023-04-28 DIAGNOSIS — E612 Magnesium deficiency: Principal | ICD-10-CM

## 2023-04-30 DIAGNOSIS — E139 Other specified diabetes mellitus without complications: Principal | ICD-10-CM

## 2023-04-30 MED ORDER — INSULIN ASPART (U-100) 100 UNIT/ML (3 ML) SUBCUTANEOUS PEN
0 refills | 0 days
Start: 2023-04-30 — End: ?

## 2023-04-30 NOTE — Unmapped (Signed)
Please forward to patient's pcp, Dr.Card. Thanks!

## 2023-04-30 NOTE — Unmapped (Signed)
Pt request for RX Refill

## 2023-04-30 NOTE — Unmapped (Signed)
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     Was a Nurse, learning disability used for this call? No    Completed refill call assessment today to schedule patient's medication shipment from the [facility]  [phone]).  All relevant notes have been reviewed.     Specialty medication(s) and dose(s) confirmed: Regimen is correct and unchanged.   Changes to medications: Matthew Key reports no changes at this time.  Changes to insurance: No  New side effects reported not previously addressed with a pharmacist or physician: None reported  Questions for the pharmacist: No    Confirmed patient received a Conservation officer, historic buildings and a Surveyor, mining with first shipment. The patient will receive a drug information handout for each medication shipped and additional FDA Medication Guides as required.       DISEASE/MEDICATION-SPECIFIC INFORMATION        N/A    SPECIALTY MEDICATION ADHERENCE     Medication Adherence    Patient reported X missed doses in the last month: 0  Specialty Medication: tacrolimus 0.5 MG capsule (PROGRAF)  Patient is on additional specialty medications: No              Were doses missed due to medication being on hold? No      tacrolimus 0.5 MG capsule (PROGRAF): 7 days of medicine on hand       REFERRAL TO PHARMACIST     Referral to the pharmacist: Not needed      [facility]     Shipping address confirmed in Epic.       Delivery Scheduled: Yes, Expected medication delivery date: 05/06/2023.     Medication will be delivered via UPS to the prescription address in Epic [facility].    Gizell [facility] Specialty Technician

## 2023-05-01 MED ORDER — PEG 3350-ELECTROLYTES 236 GRAM-22.74 GRAM-6.74 GRAM-5.86 GRAM SOLUTION
Freq: Once | ORAL | 0 refills | 1 days | Status: CP
Start: 2023-05-01 — End: 2023-05-01

## 2023-05-01 NOTE — Unmapped (Addendum)
Patient messaged [facility] via mychart to say that he continues to have intermittent issues with gas/diarrhea and inquired if an IBD referral would be helpful. The prior plan with Dr.Moon on 8/6, after finding significant stool backup on 8/5 CT, was to continue budesonide 1 more week and have patient f/up with [facility] to see if diarrhea sx resolved. If not, then will plan to stop budesonide and have patient take ~4L of golytely to clear the stool to see if sx improve after that. Spoke with patient who said he continues to have 1-1.5 days of diarrhea every week, beginning with severe gas. No blood in stool. Discussed with Dr. Waynetta Sandy, who confirmed prior recommendation to stop budesonide, take golytely, and if diarrhea does not resolve, will plan to take a course of rifaximin. Patient verbalized understanding of all discussed.

## 2023-05-05 DIAGNOSIS — Z944 Liver transplant status: Principal | ICD-10-CM

## 2023-05-05 DIAGNOSIS — E612 Magnesium deficiency: Principal | ICD-10-CM

## 2023-05-05 DIAGNOSIS — Z5181 Encounter for therapeutic drug level monitoring: Principal | ICD-10-CM

## 2023-05-05 NOTE — Unmapped (Unsigned)
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  Impression  1.  Small mesenteric fat-containing midline ventral, umbilical, and left-sided inguinal hernias are noted (as detailed). No associated fat stranding or free fluid is identified to suggest ischemia or vascular compromise. Clinical correlation with physical exam is recommended.  2.  Nonspecific haziness of the central mesentery with scattered subcentimeter central mesenteric lymph nodes also noted. Findings are overall stable to slightly increased in conspicuity when compared to prior exam. Findings are nonspecific, however, could potentially be seen in the setting of sclerosing mesenteritis. Clinical correlation is recommended.     Other tests/procedures  07/16/2022 non-targeted tranplant liver biopsy -->no rejection    02/03/23 ERCP c post-transplant anastamosis dilation and biliary stent placement in CBD; Repeart ERCP in 6 monhts to remove stent. 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 Anesthesia team administered the general anesthesia. Adequate  arterial and venous lines were obtained. Parts were prepped and draped in  a standard fashion. Foley catheter was passed with bilateral subcostal  incisions, skin, subcutaneous tissue, anterior rectus sheath, rectus  muscle, posterior rectus sheath was incised. After that, the linea alba  was also incised and the incision was extended in the midline up to the  xiphisternum. Now, we took the round ligament down and the falciform  ligament was divided and the retractor was applied. The left triangular  ligament, gastrohepatic ligament and hepato-renal ligament was incised and  Thompson's  retractor application was completed.    We started dissecting the porta hepatis, the cystic duct was loop ligated  and divided. The peritoneum over the porta hepatis was divided. The left  hepatic artery, right hepatic artery was serially looped, ligated and  divided. The common hepatic duct also was looped, ligated and divided.  After that, we removed all the fibro-adventitial tissue around the portal  vein keeping its length straight. The right triangular ligament was  divided and the liver was lifted up from the retroperitoneal tissue. The  peritoneum over the infrahepatic inferior vena cava was then divided and  the incision was extended onto the left side. Now, the infrahepatic  inferior vena cava was looped with serial ligation on the cephalic side, we  looped, ligated and divided the adrenal vein and after that, we removed all  the fibro-adventitial tissue in the retrocaval area and inferior vena cava  was also looped. The liver was ready to be explanted.  Anesthesia team was  alerted. The portal vein was clamped , the infrahepatic inferior vena cava  and  suprahepatic inferior vena cava were clamped as well. The patient tolerated  the clamps very well and hence, all these 3 structures were divided and the  liver was explanted.    The new liver was  brought out from the ice-box and was brought into the  operating table. The suprahepatic inferior vena caval anastomosis was done  with a 3-0 Prolene in end-to-end fashion. The infrahepatic inferior vena  cava anastomosis was done with a 4-0 Prolene in a continuous fashion. Just  before the completion of this anastomosis, we gave 1.5 liter of chilled  Ringer Lactate solution flush thorugh the portal vein and effluent was allowed to  come out through this infrahepatic inferior vena cava anastomosis and upon  completion of this, the anastomosis was completed.    Now, all the retractors were relieved. The lap pad was kept behind the  liver which was eventually removed and the both porta hepatis were allowed  to come as near as possible. The spoon clamp was applied. The length was  measured to keep it as short and straight as possible and we did the  anastomosis of the portal vein with a 6-0 Prolene suture in a running  fashion. Before doing the anastomosis, we had opened up the clamp on the  recipient portal vein and flushed out around 400 mL of the blood to remove  any clot and there was none and heparinized saline flush was given in this  portal vein as well. Now upon completion of this anastomosis, the  anesthesia team was alerted and we removed the portal clamp, the inferior  hepatic and inferior vena cava clamp and supra-hepatic inferior vena cava  clamp and the patient tolerated the re-perfusion very well and there was no  central re-perfusion syndrome. The remainder of the hemostasis was taken  care of.      We started cleaning the left and right hepatic artery of the  recipient and seeing right gastric artery , it was  looped, ligated and  divided. The bulldog clamp was  applied on the proper hepatic artery and then we divided the artery  exactly at the level of the bifurcation of right and left for doing an anastomosis. On the donor  hepatic artery there was a very good backflow of blood. We infused with  heparinized saline solution and we kept the bulldog clamp and we divided  the donor hepatic artery above the splenic artery and we did the  anastomosis in end-to-end fashion with a 7-0 Prolene suture in a running  fashion. Before doing the anastomosis, we had flushed out the recipient  hepatic artery and the flow was great and there was no evidence of a clot  and a heparinized saline flush was also given.  Now, the liver was ready to be re-perfused with the artery as well and upon  removing the clamp, pinked up very well.      The gallbladder was taken out in a fundus first pattern, the donor bile duct was divided. Bleeding edges were taken care of with the 7-0 Prolene suture after the recipient bile  duct was divided and the bleeding edges were taken care of with the 7-0  Prolene suture as well. Now, we did an end-to-end anastomosis of bile duct  to bile duct with the 6-0 PDS sutures in an interrupted fashion fashion. We did  not use any stent. Before doing the anastomosis, we had sounded both the  bile ducts and the probe was going in very easily. Thus the  choledochocholedochostomy was completed.     We did several rounds of hemostasis. It was looking very satisfactory. We  kept one JP behind the liver and one in the Morrison's pouch and  both were  removed from the anterior abdominal wall and secured with a 3-0 nylon  suture. Retractors were removed. The lap counts were done and we made sure after  removing all  retractors that main vessels  were not kinked and we put a  small surgicel ball below the hepatic artery to give a nice and gentle curve  and after that, we re-closed the abdomen in multiple layers with number 1  PDS sutures and the skin was stapled.    The  patient was shifted to ICU after the surgery in stable condition and family was informed.

## 2023-05-08 ENCOUNTER — Ambulatory Visit: Admit: 2023-05-08 | Discharge: 2023-05-09 | Payer: MEDICARE

## 2023-05-08 DIAGNOSIS — E119 Type 2 diabetes mellitus without complications: Principal | ICD-10-CM

## 2023-05-08 DIAGNOSIS — K458 Other specified abdominal hernia without obstruction or gangrene: Principal | ICD-10-CM

## 2023-05-08 LAB — HEMOGLOBIN A1C
ESTIMATED AVERAGE GLUCOSE: 148 mg/dL
HEMOGLOBIN A1C: 6.8 % — ABNORMAL HIGH (ref 4.8–5.6)

## 2023-05-08 MED ORDER — AMLODIPINE 10 MG TABLET
ORAL | 0 refills | 30 days | Status: CP
Start: 2023-05-08 — End: 2024-05-07
  Filled 2023-06-10: qty 30, 30d supply, fill #0

## 2023-05-08 MED FILL — OZEMPIC 0.25 MG OR 0.5 MG (2 MG/3 ML) SUBCUTANEOUS PEN INJECTOR: SUBCUTANEOUS | 28 days supply | Qty: 3 | Fill #5

## 2023-05-08 MED FILL — CARVEDILOL 12.5 MG TABLET: ORAL | 90 days supply | Qty: 180 | Fill #1

## 2023-05-08 MED FILL — TACROLIMUS 0.5 MG CAPSULE, IMMEDIATE-RELEASE: ORAL | 30 days supply | Qty: 180 | Fill #2

## 2023-05-08 MED FILL — DULOXETINE 30 MG CAPSULE,DELAYED RELEASE: ORAL | 90 days supply | Qty: 180 | Fill #3

## 2023-05-08 MED FILL — TRESIBA FLEXTOUCH U-100 INSULIN 100 UNIT/ML (3 ML) SUBCUTANEOUS PEN: SUBCUTANEOUS | 100 days supply | Qty: 15 | Fill #2

## 2023-05-08 MED FILL — ATORVASTATIN 40 MG TABLET: ORAL | 90 days supply | Qty: 90 | Fill #1

## 2023-05-08 NOTE — Unmapped (Signed)
The patient is requesting a medication refill

## 2023-05-08 NOTE — Unmapped (Signed)
BRIEF GASTROINTESTINAL AND HERNIA SURGERY CLINIC NOTE    I reviewed and examined Mr Matthew Key. 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 On examination, additional defects can be palpated throughout his incision. Because of the presence of multiple hernias along a long incision, it would be better to reinforce the whole incision with mesh by doing an abdominal wall reconstruction. We discussed possibly just doing a local MIS IPOM repair but he elects that he would rather do everything he can to avoid developing another hernia. I explained the risks of the operation including hernia recurrence, bleeding, infection, pain, need for additional procedures and reoperation and more. He understands and gave verbal consent to undergo open abdominal wall reconstruction with mesh. We will coordinate with TXP team and anesthesia to schedule the surgery.    [facility] Estell Harpin MD   Gastrointestinal and Hernia Surgery

## 2023-05-08 NOTE — Unmapped (Signed)
You were seen at the [facility] by Dr. Thomes Cake for evaluation of ventral hernia.      THINGS FOR SURGERY:  We will be scheduling the following surgery: Open ventral hernia repair with mesh and abdominal wall reconstruction  If you have not been seen in clinic within 6 months of your scheduled surgery date, we will schedule you a return visit within 1 month of your surgery for reevaluation.  Continue to abstain from smoking.  Smoking increases the possibility of complications after surgery.  Abstain from use of tobacco products (including cigarettes, cigars, chewing tobacco, pipe tobacco, and e-cigarettes) - it can increase your risk of hernia recurrence, wound infection, poor wound healing, and other perioperative complications  Continue to lose weight. Excess weight can increase risk of hernia recurrence, wound infection, poor wound healing, and other perioperative complications  Ensure that you are having 1-2 soft, but formed, bowel movements daily prior to your surgery.  You may achieve this by using over-the-counter stool softeners such miralax and/or docusate  See your Primary Care Provider (PCP) for preoperative check-up, 1 week BEFORE your surgery.  If you do not have a PCP, let our office know and a referral to [facility] can be made  Two days before surgery, avoid solid food intake.  You should stick with liquids.  You may use Boost, Ensure, Atkins, or other protein shakes as your meal replacements  Please hold your aspirin for 5 days prior to surgery and the day of surgery    OTHER  MEDICAL APPOINTMENTS THAT NEED TO OCCUR PRIOR TO SURGERY:  A Pre-care / Pre-Anesthesia appointment has been made for you.  This will take place prior to surgery  Please see you transplant hepatologist prior to surgery.      THINGS DISCUSSED DURING THE OFFICE VISIT:  We reviewed your imaging results today  We did discuss reducible versus incarcerated hernias.  We also discussed signs and symptoms of obstruction and strangulation: nausea, vomiting, fever, severe pain, hernia bulge that turns red or purple, or inability to move bowels or pass gas.  If any of these concerning signs or symptoms develop, call our office and/or go to the nearest emergency room for evaluation  We did discuss there is a risk of developing chronic pain after surgery that hernia repair may not completely fix all pain symptoms  We did discuss the possible use of mesh and the fact that the benefits outweigh the inherent risks associated for its intended use in certain types of hernia repair  If urgent/emergent surgery is required before scheduled elective surgery, I will make every effort to be available for the hernia repair    THINGS FOR AFTER SURGERY FOR POSTOP RECOMMENDATIONS  Limit activity for a total of six weeks after surgery.  No lifting objects greater than 20 pounds.  After that period, advance activity as tolerated.  It takes approximately 6 weeks for 80% of your healing to take place.  Continue to wear the abdominal binder until 6 weeks after surgery.  After that, wear it as desired  After your surgery, gently stretch lower extremities before and after each walk. Hold onto a counter or back of chair for support if needed. Increase walking as tolerated each day. If you feel an increase in pain, swelling, or fatigue, limit increase in walking to no more than 5 minute intervals. Walk slowly, as if walking with a toddler.  Do not exceed a talk test -  You should be able  to carry on a conversation while walking - you may be a little out of breath, but still able to answer. If you are unable to maintain talk test with changing speed, add 2 more days to walking program and then try again.  Sometime surgical scars become overly painful or sensitive. When this occurs, desensitization can be used to help normalize the sensations around the scar. Gently rub the area around the incision with light touch using your finger, a feather, cotton balls, or a t-shirt. Different textures may be used.  Continue to abstain from smoking.  Smoking plays a role in surgical recovery and outcomes.  Smoking can increase risk of hernia recurrence, wound infection, poor wound healing, and other perioperative complications  Continue with the Postoperative rehabilitation program from the [facility] mobile phone app    FOLLOW-UP  If you have not been seen in clinic within 6 months of your scheduled surgery date, we will schedule you a return visit within 1 month of your surgery for reevaluation.  Return to clinic 1-3 weeks after discharge from  surgery for post-operative check  If you have any questions or concerns, please call our office or send a message through MyChart      Monitor for signs and symptoms of strangulated hernia that would require immediate attention or emergency care include: nausea, vomiting, fever, severe pain, hernia bulge that turns red or purple, or inability to move bowels or pass gas.  If you experience any of these symptoms please seek emergency care.    [facility] is a teaching institution and your care will be provided by a team led by an attending surgeon and may include advanced practice providers, resident physicians, fellows, medical students, and other health care providers.  Their level of participation in your care is based on each practitioner's scope of practice and skill set, and each works under the supervision of an Armed forces technical officer.        What to expect after your Ventral Hernia Repair with mesh and   possible Abdominal Wall Reconstruction  Below is information to help guide you in your recovery from surgery    Description of the Repair  We will be repairing your hernia using with mesh placed between your fascia and muscle layers.  This mesh will be incorporated into scar tissue your body will form, further strengthening the hernia repair over the following months.       Preparing for surgery  If you have an iPhone, please download the [facility] app from the apple store.  This app provides helpful information on how to prepare for, what to expect during, and how to recover from your surgery.  If you need to cancel or reschedule surgery, please notify the surgical office  You will be called by the hospital the day before your surgery and told the time of your operation and when to arrive  Abstain from smoking tobacco products for at least 6 weeks before surgery.    Abstain from smoking marijuana products for at least 3 days prior to surgery.  You may be asked to see anesthesiology or the pre-care clinic for evaluation prior to your surgery.  You will receive a call from their office for scheduling  Two days before surgery, limit your solid food intake.  Boost, ensure, and/or Atkins shakes are good nutritional supplements to use  No food after midnight the night before surgery.  This includes gum and candy  You may have clear liquids up to TWO hours before  your scheduled arrival time.  Clear liquids include: water, apple juice, Gatorade, black coffee without milk/creamer, sodas.  You are not allowed: Jello, broths, thickened liquids, alcohol, shakes or protein drinks.  Arrange for a family member, friend, or caregiver who is 18 years or older to be available before, during, and after surgery.  (Please note: A bus driver or cab driver is not a responsible caregiver)  No alcoholic beverages or tobacco products should be used 24 hours prior to surgery    Day of surgery  Shower or bathe twice with an Antimicrobial soap (or CHG soap provided) from chin to toes.  Do this the night before your surgery and again the morning of your surgery.  Lather up with the soap, leave it on your skin 2-3 minutes and rinse off.  Wash your hair and face as your normally would.  Do NOT shave your chest, abdomen, or groins  Do not wear makeup, lotion, creams, powders, perfumes, nail polish, deodorants, or jewelry  Take medications as instructed with a sip of water  Bring a picture ID and Insurance Card(s)  If instructed, bring all your medications with you in their original containers  Leave all valuables at home, including wedding bands and other jewelry  If you wear eye glasses, contact lenses, dentures, or hearing aids, please bring them and a case with you as these items cannot be worn during surgery  Bring a copy of your advance directives (living will) if you have one  If you are being admitted after surgery, leave your suitcase in the car until a room assignment has been made  You will meet your anesthesia care team on the day of your surgery.  To learn more about anesthesia options, please visit: [email or web address] (click on patients)    After surgery  You will go to a recovery are called the Post-Anesthesia Care Unit (PACU) when your surgery is complete  A phone-call will be made to your designated person to provide an update on your status  Recovery time is dependent upon the type of procedure you have undergone and the anesthesia used  If you are going home the same day as your surgery, you and your responsible adult will receive verbal and written discharge instructions, follow-up appointment information and prescriptions  If you are going to be admitted to the hospital after surgery, you will be transferred to your assigned hospital room once the PACU staff feels that it is safe      Your Recovery  Plan to stay in the hospital for 3-7 days after your surgery.  Depending on the complexity of your hernia, you may need to spend the first 1-2 nights in the ICU.  You should be able to walk and get out of bed the evening of your surgery.  You will be given an incentive spirometer and be instructed on breathing exercises to prevent pneumonia.    You will be seen and evaluated by physical therapy and occupational therapy.  Although you may feel fatigued after surgery, please ensure that you participate in these activities and sessions, as it is a vital part of your recovery.  After you are discharged from the hospital, your expected recovery period is at least 6-8 weeks. Vigorous straining, prolonged vomiting, excessive coughing (and other methods of increased abdominal pressure) too soon after surgery can damage your repair before the mesh has a chance to anchor in position with scar tissue. This can cause your hernia to return and require another operation. Avoid  heavy-lifting and any activity that causes you to strain and ???get red in the face??? during the first four weeks after surgery.  A safe approach is to avoid lifting more than a gallon of milk for the first two weeks and no more than 20 pounds during the two weeks after that.      Pain Relief  During your surgery, you may be given a long acting numbing medication using liposomal bupivacaine (Exparel) or an epidural catheter.  For the first 2-4 days after surgery, your pain will be managed either by an intravenous patient controlled analgesia (PCA) device or through an epidural catheter. Pain medicine will be transitioned from intravenous (IV) to oral pills when you are able to eat and drink.  After your are discharged from the hospital:  1. Take 2-3 tablets of Tylenol 325mg  (or generic acetaminophen) every 6 hours for 3 days, then only as needed to control discomfort.  [DO NOT exceed 4000mg  in a 24 hour period, as this can be damaging to your liver]  2. Take 2-3 tablets of Advil 200mg  (or generic ibuprofen) every 6 hours with food or milk for 3 days, then only as needed to control discomfort. [DO NOT use Advil if you have a history of stomach or intestinal ulcers or have had problems taking aspirin in the past.]  3. Take 1-2 tablets of oxycodone 5 mg (or Dilaudid 2 mg) tablets every 4-6 hours as needed for discomfort that remains after taking Tylenol & Advil. Not everybody needs this, so you might choose not to fill the prescription. This is a narcotic pain killer. If you use it you must beware of becoming drowsy or inattentive, and you will not be able to drive or operate dangerous equipment. It can also cause nausea and constipation.  4.  You can place an icepack over the surgical site.  This can help reduce swelling and pain.  The above combination is for maximum pain control. You may skip some or all of the medications if you???re comfortable without them.    Wound/Incision Management  Your incision will either be closed with staples which will be removed at your follow-up visit or with dissolvable sutures underneath the skin and covered with surgical skin glue.  You will also have Jackson-Pratt (JP) drains which are used to remove fluid buildup around the operative site.  The length of time these drains remain in place will vary based on your operation, the mesh used, where the drain is placed, and the amount of fluid coming out of the drain.  Most drains will be removed before discharge; others may need to remain in place for 1-2 more weeks.  You will be given a Velcro elastic abdominal binder, wear it during the day and when being active for six weeks after surgery. It will provide extra support, and many people find it more comfortable with it on.             **If you have a smartphone, please download the [facility] mobile app from your android or apple app store.  This abdominal core health mobile app contains useful information on exercises and stretches you can start to do now to prepare BEFORE surgery.  It also has useful information for after surgery, including: pain management strategies and opioid information, activities and exercises to help with recovery - including diagrams and videos of proper technique.  You and your physical therapist may also download this Abdominal Core Surgery Rehabilitation Protocol Guide online at https://www.young.biz/**  If you have any questions, please don't hesitate to call our office:  [facility] Department of Gastrointestinal Surgery  [address]  [facility] - First Floor  [place], [place]  [address]    Phone: [phone]  Fax: [phone]      During regular business hours: (Monday-Friday, 8am-4:30 pm) you may call the General and Acute Care Surgery Office at [phone].  Non-urgent questions can also be sent via [facility].   For emergencies outside of business hours, if you are unable to reach the office and speak to someone, please go to [facility] or your local emergency room.      [facility] General Surgery  [address], [place], [place] [address]  Located in the Multispecialty Clinic on the first floor of the [facility] [place]  T: [phone] (related to appointment scheduling)      Administrative Office  [address]  [place], [place] [address]   T:  [phone]  F:  [phone]  [email or web address]  Heidi Prestemon (Dr. Mosie Epstein [phone]  Delbert Phenix (ACS and Trauma Surgery [phone]        [facility] Imaging Contact Information: Please be proactive and call to schedule these tests when ordered.  *Radiology (CT, X-ray, Ultrasound) [phone]  *Mammography & Breast Ultrasound [phone]  *MRI [phone]  *Interventional Radiology [phone]     AFTER HOURS/HOLIDAYS:  For emergencies after-hours (after 5pm, or weekends): call the [facility] operator [phone]) and ask to page the Surgery resident on call. You will be directed to a surgery resident who likely is not immediately aware of the details of your case, but can help you deal with any emergencies that cannot wait until regular business hours.  Please be aware that this person is responding to many in-hospital emergencies and patient issues and may not answer your phone call immediately, but will return your call as soon as possible.    For emergent issues, please don't send mychart messages after hours and holidays as these may not be seen in a timely fashion.     Billing Questions/Financial Navigation:  [phone]  [phone]    Pharmacy Assistance Program  [phone]     Pre-Procedure Services  [facility] Pre-Procedure Services at [facility]   [address]  [place], [place] [address] ([place] - [place] [facility] near [employer])  Appt line:  [phone]     Blood Work Locations:  [address] [place] [address].   [address], First Floor [facility], Blood Drawing Lab  [address] located in [employer]  Go to the main desk and tell them you are there for walk-in labs.    Precare:   The day prior to your scheduled surgery, Pre-Care will call you with instructions.  If you have not heard from them by 4PM, and would like to check on the status of your surgery, please call:  [facility]: [phone]  [facility]:  [phone]      Weather Hotline:  [phone]    MYChart issues:  [phone]     [facility] forms and paperwork:  Can be emailed to [email or web address] faxed [phone]), attached to a MyChart message or brought to clinic.                      Abdominal Core Health Quality Collaborative [facility])    The [facility] aims to improve the value in hernia care delivered to patients. Formed in 2013 by hernia surgeons  in private practice and academic settings, the [facility] utilizes concepts of continuous quality improvement to improve outcomes and optimize costs. This is accomplished through patient-centered data collection, ongoing performance feedback to clinicians, and improvement based on analysis of collected data and collaborative learning.     Information regarding the [facility] should be provided to you in clinic if you are scheduled for surgery.  It can also be found at: [email or web address].  Your participation in the registry is completely voluntary and deciding to not include your information in the [facility] registry will have no impact your care.     Once you have consented for surgery, your surgeon will include your personal health information (such as your name, birthdate, address, and health care treatment) in the [facility] confidential and secure data registry unless you request (as explained below) that your personal health information not be included. We hope you will allow your information to be included because doing so will help improve the quality of care for hernia patients     If you do not want your personal health information included in the [facility] database or you do not want to receive emails, text messages, or other communications from [facility] , please inform your surgeon or contact [facility] (by email to [email or web address] or call [phone]).

## 2023-05-08 NOTE — Unmapped (Addendum)
Met c pt during clinic.  Provided AVS, RN Contact information and precare process. Pt would prefer to come to [facility] even if it's just a nurse visit for his preop testing.  Reviewed [facility] app.  Pt will be having surgery with Dr. Normand Sloop with this RN offering pt a surgery date as soon as date located.   This RN will reach out to transplant for perioperative asa use recommendation.  Pt to have hgba1c checked upon leaving clinic today.

## 2023-05-08 NOTE — Unmapped (Signed)
New Patient Consultation    Referral Diagnosis: Ventral hernia    Referring Provider: [facility]     Marene Lenz, MD  [address]  [place],  [place] [address]   Referring Documents:  Referral note: in Epic chart    Op notes: in Epic chart    Imaging: CT scan from 03/17/23 in Epic chart imaging        NEW PATIENT HISTORY AND PHYSICAL   05/08/2023      Assessment and Plan:  62 y.o. obese male with BMI of 31 and comorbidities of HTN, h/o DVT of LUE, CVA, OSA on CPAP, crytogenic/MASH cirrhosis, GERD, ESRD-stage 4, h/o SBP, T2DM, HLD, h/o Boerhaves syndrome, h/o duodenal ulcer with an initial, small, mildly symptomatic, non-obstructing, and reducible incisional ventral hernia.      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      We did discuss the natural history of hernia disease and that a hernia will not go away without surgery.    We discussed the risks and signs of incarceration, obstruction, and strangulation.    We discussed the various treatment options available.  Specifically, we engaged in a shared decision making process.        I do think he may benefit from elective hernia repair.  We will plan for: open abdominal wall reconstruction.   We discussed the risks (including, but not limited to bleeding, infection, pain, hernia recurence, damage to surrounding structures, injury to bowel, chronic pain, stroke, heart attack, and death), benefits, and alternatives were discussed with the patient.  We discussed what would be involved with the surgical treatment of his hernia.  He was given time to ask questions and he appears to demonstrate understanding of the situation and agrees to proceed.  Informed consent has been verbally obtained, and signed consent will need to be obtained in the preoperative suite.    Given his medical history I do think, prior to surgery, he will benefit from a pre-procedure evaluation in pre-care and preoperative evaluation and optimization from coordination with transplant/hepatology team .      THINGS FOR SURGERY:  We will be scheduling the following surgery: Open ventral hernia repair with mesh and possible abdominal wall reconstruction versus minimally invasive ventral hernia repair  If you have not been seen in clinic within 6 months of your scheduled surgery date, we will schedule you a return visit within 1 month of your surgery for reevaluation.  Continue to abstain from smoking.  Smoking increases the possibility of complications after surgery.  Abstain from use of tobacco products (including cigarettes, cigars, chewing tobacco, pipe tobacco, and e-cigarettes) - it can increase your risk of hernia recurrence, wound infection, poor wound healing, and other perioperative complications  Continue to lose weight. Excess weight can increase risk of hernia recurrence, wound infection, poor wound healing, and other perioperative complications  Ensure that you are having 1-2 soft, but formed, bowel movements daily prior to your surgery.  You may achieve this by using over-the-counter stool softeners such miralax and/or  docusate  See your Primary Care Provider (PCP) for preoperative check-up, 1 week BEFORE your surgery.  If you do not have a PCP, let our office know and a referral to [facility] can be made  Two days before surgery, avoid solid food intake.  You should stick with liquids.  You may use Boost, Ensure, Atkins, or other protein shakes as your meal replacements  Please hold your aspirin for 5 days prior to surgery and the day of surgery    OTHER  MEDICAL APPOINTMENTS THAT NEED TO OCCUR PRIOR TO SURGERY:  A Pre-care / Pre-Anesthesia appointment has been made for you.  This will take place prior to surgery  Please see you transplant hepatologist prior to surgery.      THINGS DISCUSSED DURING THE OFFICE VISIT:  We reviewed your imaging results today  We did discuss reducible versus incarcerated hernias.  We also discussed signs and symptoms of obstruction and strangulation: nausea, vomiting, fever, severe pain, hernia bulge that turns red or purple, or inability to move bowels or pass gas.  If any of these concerning signs or symptoms develop, call our office and/or go to the nearest emergency room for evaluation  We did discuss there is a risk of developing chronic pain after surgery that hernia repair may not completely fix all pain symptoms  We did discuss the possible use of mesh and the fact that the benefits outweigh the inherent risks associated for its intended use in certain types of hernia repair  If urgent/emergent surgery is required before scheduled elective surgery, I will make every effort to be available for the hernia repair    THINGS FOR AFTER SURGERY FOR POSTOP RECOMMENDATIONS  Limit activity for a total of six weeks after surgery.  No lifting objects greater than 20 pounds.  After that period, advance activity as tolerated.  It takes approximately 6 weeks for 80% of your healing to take place.  Continue to wear the abdominal binder until 6 weeks after surgery.  After that, wear it as desired  After your surgery, gently stretch lower extremities before and after each walk. Hold onto a counter or back of chair for support if needed. Increase walking as tolerated each day. If you feel an increase in pain, swelling, or fatigue, limit increase in walking to no more than 5 minute intervals. Walk slowly, as if walking with a toddler.  Do not exceed a talk test -  You should be able to carry on a conversation while walking - you may be a little out of breath, but still able to answer. If you are unable to maintain talk test with changing speed, add 2 more days to walking program and then try again.  Sometime surgical scars become overly painful or sensitive. When this occurs, desensitization can be used to help normalize the sensations around the scar. Gently rub the area around the incision with light touch using your finger, a feather, cotton balls, or a t-shirt. Different textures may be used.  Continue to abstain from smoking.  Smoking plays a role in surgical recovery and outcomes.  Smoking can increase risk of hernia recurrence, wound infection, poor wound healing, and other perioperative complications  Continue with the Postoperative rehabilitation program from the [facility] mobile phone app    FOLLOW-UP  If you have not been seen in clinic within 6 months of your scheduled surgery date, we will schedule you a return visit within 1 month of your surgery for reevaluation.  Return  to clinic 1-3 weeks after discharge from  surgery for post-operative check  If you have any questions or concerns, please call our office or send a message through MyChart      Note initiated by:  Nicholos Johns, MD    ATTESTATION signed by Kristopher Oppenheim, MD, MPH at 05/15/23 6:29 AM   I saw and evaluated the patient with the resident after the initial interview.   I reviewed the resident???s note, have edited where appropriate, and agree with the resident???s findings and plan.    Discussed both minimally invasive hernia repair to address the most visible hernias versus treatment of his entire liver transplant incision which would require abdominal wall reconstruction.    Prior to surgery patient should be seen by [facility].  Need to ensure optimization with transplant hepatology.    To allow for more expedient surgery, patient is amenable to being scheduled with my new partner Dr. Genia Hotter from [facility].    --Kristopher Oppenheim, MD, MPH, MS, FACS  Department of Surgery, General Surgery             CC: Ventral hernia     HPI: 62 y.o. male coming in for evaluation of ventral hernia at the request of Tawni Carnes, MD.      He has a BMI of 3 and PMH of HTN, h/o DVT of LUE, CVA, OSA on CPAP, crytogenic/MASH cirrhosis, GERD, ESRD-stage 4, h/o SBP, T2DM, HLD, h/o Boerhaves syndrome, h/o duodenal ulcer.  His last HgbA1c was 7.3 (02/04/23).    He is complaining of bulging on the L side of his incision.        He has noticed a bulge for 9 months.    This bulge is reducible.  He denies overlying skin changes.  He denies obstructive symptoms.  He endorses occasional pain in the following areas: epigastric.        He has had the following abdominal surgeries:  S/p OLT (02/07/22, [facility]ai @ [facility])    Last colonoscopy was: not noted     He does NOT use any cigarettes, cigars, e-cigarettes, pipe tobacco, or chewing tobacco.    He has a behavioral health history of no psychiatric issues.  He has substance/opioid use history of no opioid use.    He walks independently.  He can walk up a flight of stairs without stopping (4 METs).  He participates in the following sports activity: none.    His normal employment consists of the following activity: No Employment.  He lives with their family.    Notable [facility] comorbidities for hernia repair include: history of prior surgery at hernia site and hepatic insufficiency or liver failure  Notable [facility] history for ventral hernias: current hernia is related to a prior organ transplantation operation      ROS Patient is here today with: self  History obtained from chart review and the patient   Please refer to the 12 point review of systems sheet scanned in the media tab and labeled Matthew Key intake       Medications Current Outpatient Medications   Medication Sig Dispense Refill    acetaminophen (TYLENOL) 325 MG tablet Take 1-2 tablets (325-650 mg total) by mouth every six (6) hours as needed for pain or fever. 100 tablet 11 aspirin (ECOTRIN) 81 MG tablet Take 1 tablet (81 mg total) by mouth daily. 90 tablet 3    atorvastatin (LIPITOR) 40 MG tablet Take 1 tablet (40 mg total) by mouth daily. 90 tablet  3    blood sugar diagnostic (ACCU-CHEK GUIDE TEST STRIPS) Strp Use to check blood sugar as directed with insulin 3 times a day & for symptoms of high or low blood sugar 100 strip 11    blood-glucose meter kit Use as instructed 1 each 0    blood-glucose meter Misc Use to test blood sugar as directed 1 each 0    carvedilol (COREG) 12.5 MG tablet Take 1 tablet (12.5 mg total) by mouth two (2) times a day. 180 tablet 3    DULoxetine (CYMBALTA) 30 MG capsule Take 2 capsules (60 mg total) by mouth daily. 180 capsule 3    gabapentin (NEURONTIN) 300 MG capsule Take 1 capsule (300 mg total) by mouth Three (3) times a day. 90 capsule 0    insulin aspart (NOVOLOG FLEXPEN) 100 unit/mL (3 mL) injection pen Inject under the skin per sliding scale prior to meals. If premeal BG 151-200 take 2 additional units for correction, if 201-250 take 4 additional units, etc. Max dose 36 units per day.  Store in-use prefilled pens at room temperature <86??F and use within 28 days; do not refrigerate. 15 mL 0    insulin degludec (TRESIBA FLEXTOUCH U-100) 100 unit/mL (3 mL) InPn Inject 0.15 mL (15 Units total) under the skin daily. Adjust as instructed. 15 mL 3    lancets (ACCU-CHEK SOFTCLIX LANCETS) Misc Use to check blood sugar as directed with insulin 3 times a day & for symptoms of high or low blood sugar. 100 each 0    melatonin 3 mg Tab Take 2 tablets (6 mg total) by mouth.      pantoprazole (PROTONIX) 40 MG tablet Take 1 tablet (40 mg total) by mouth daily as needed. 30 tablet 11    pen needle, diabetic (BD ULTRA-FINE NANO PEN NEEDLE) 32 gauge x 5/32 (4 mm) Ndle Inject 1 each under the skin Four (4) times a day with a meal and nightly. Use as directed  1-2 times per day 100 each 11    semaglutide (OZEMPIC) 0.25 mg or 0.5 mg (2 mg/3 mL) PnIj Inject 0.5 mg under the skin every seven (7) days. 6 mL 11    tacrolimus (PROGRAF) 0.5 MG capsule Take 3 capsules (1.5 mg total) by mouth two (2) times a day. 180 capsule 11    tamsulosin (FLOMAX) 0.4 mg capsule Take 1 capsule (0.4 mg total) by mouth daily.      amlodipine (NORVASC) 10 MG tablet Take 1 tablet (10 mg total) by mouth daily. 30 tablet 0     No current facility-administered medications for this visit.      Allergies Allergies   Allergen Reactions    Bee Venom Protein (Honey Bee) Swelling    Venom-Honey Bee Swelling      Medical History Past Medical History:   Diagnosis Date    AKI (acute kidney injury) (CMS-HCC) 12/14/2020    Anxiety 10/22/2013    Arthritis     Cervical radiculopathy 12/03/2016    Chronic pain disorder     Lower back    Cirrhosis (CMS-HCC)     Dental abscess 10/2020    Duodenal ulcer 12/01/2017    GERD (gastroesophageal reflux disease)     History of transfusion     Hyperlipidemia 10/22/2013    Hypertension     under control with meds and weight loss    Hypertension associated with diabetes (CMS-HCC) 09/25/2021    Hypotension 01/29/2022    Liver disease     Liver  transplant status (CMS-HCC) 02/06/2022    Sleep apnea, obstructive     Have machine    Stroke (CMS-HCC)     mild stroke    Type 2 diabetes mellitus with diabetic neuropathy, with long-term current use of insulin (CMS-HCC) 06/09/2014      Surgical History As above in HPI   Family History Noncontributory   Social History Tobacco use:   reports that he has never smoked. He has never used smokeless tobacco.  Alcohol use:   reports that he does not currently use alcohol.  Drug use:  reports that he does not currently use drugs.  Living situation: per HPI  Employment: per HPI       Vitals: Blood pressure 147/71, pulse 71, temperature 36.4 ??C (97.5 ??F), temperature source Temporal, height 172.7 cm (5' 8), weight 91 kg (200 lb 9.6 oz).   BMI Estimated body mass index is 30.5 kg/m?? as calculated from the following:    Height as of this encounter: 172.7 cm (5' 8).    Weight as of this encounter: 91 kg (200 lb 9.6 oz).   Physical Exam GENERAL:  Pleasant, no acute distress.     NEURO:  Alert and oriented   PSYCH: Affect appropriate. Judgment and insight normal.  HEENT: Sclerae anicteric.    RESP: Unlabored.    SKIN: No unusual rash, ecchymoses or petechiae seen in the exposed areas.  ABDOMEN:   well-healed mercedes incision. Small L sided fascial defect with soft, reducible hernia. Small bulging at L lateral aspect of incision.  Soft f fascial scar along the length of his [employer] incision.               Imaging: CT scan from 03/17/2023 shows gallbladder surgically absent, upper midline fat-containing hernia spanning 2.5 cm wide by 2.6 cm long, atrophic bilateral rectus muscles, small 0.7 cm umbilical hernia, fat-containing left inguinal hernia

## 2023-05-12 DIAGNOSIS — Z944 Liver transplant status: Principal | ICD-10-CM

## 2023-05-12 DIAGNOSIS — K862 Cyst of pancreas: Principal | ICD-10-CM

## 2023-05-12 DIAGNOSIS — E612 Magnesium deficiency: Principal | ICD-10-CM

## 2023-05-12 DIAGNOSIS — Z5181 Encounter for therapeutic drug level monitoring: Principal | ICD-10-CM

## 2023-05-12 NOTE — Unmapped (Signed)
Reached out to Dr.Moon to inquire if patient would need annual MRI or MRCPs with pancreatic cyst monitoring. He replied that the pancreatic cysts have been present for 2 years,and we can continue monitoring annual with MRIs up to 5 yrs, adding that there was no need to do MRCP unless he develops elevated liver enzymes or symptoms consistent with biliary obstruction.

## 2023-05-13 DIAGNOSIS — Z944 Liver transplant status: Principal | ICD-10-CM

## 2023-05-13 DIAGNOSIS — Z5181 Encounter for therapeutic drug level monitoring: Principal | ICD-10-CM

## 2023-05-13 DIAGNOSIS — E612 Magnesium deficiency: Principal | ICD-10-CM

## 2023-05-14 NOTE — Unmapped (Signed)
I called Mr. Brawn to discuss surgical options. I explained in length that he has a fairly large incision from his liver TXP however his hernia is fairly small. We discussed the options of performing repair of only the small 2-3cm hernia robotically with mesh (TAPP or IPOM) and that would be a smaller 1-2h operation compared to an open abdominal wall reconstruction that would be needed to cover the entire transplant incision and can take up to 6-7 hours and higher perioperative risks.   Mr. Alldredge expressed the desire to avoid other hernia operations in the future and was leaning towards undergoing another major operation, however he agrees that if there are no additional hernias along the incision a smaller surgery could be sufficient at this time.   We agreed that we would insert a camera and inspect the entire abdomen. If the hernia is indeed limited to a small portion of the incision, we will proceed with robotic TAPP or IPOM. However, if on inspection there are multiple small defects along the incision that would be just a matter of time until they increase in size and become symptomatic, we would then go forward with the abdominal wall reconstruction.     Mr. Fairhurst questions were answered and he was pleased with the plan. We will in finding OR availability to schedule.     [facility] Estell Harpin MD   Gastrointestinal and Hernia Surgery

## 2023-05-19 DIAGNOSIS — E612 Magnesium deficiency: Principal | ICD-10-CM

## 2023-05-19 DIAGNOSIS — Z5181 Encounter for therapeutic drug level monitoring: Principal | ICD-10-CM

## 2023-05-19 DIAGNOSIS — Z944 Liver transplant status: Principal | ICD-10-CM

## 2023-05-22 DIAGNOSIS — Z01818 Encounter for other preprocedural examination: Principal | ICD-10-CM

## 2023-05-22 DIAGNOSIS — I8500 Esophageal varices without bleeding: Principal | ICD-10-CM

## 2023-05-22 DIAGNOSIS — E119 Type 2 diabetes mellitus without complications: Principal | ICD-10-CM

## 2023-05-22 DIAGNOSIS — Z944 Liver transplant status: Principal | ICD-10-CM

## 2023-05-22 DIAGNOSIS — K439 Ventral hernia without obstruction or gangrene: Principal | ICD-10-CM

## 2023-05-22 DIAGNOSIS — I10 Essential (primary) hypertension: Principal | ICD-10-CM

## 2023-05-22 DIAGNOSIS — D696 Thrombocytopenia, unspecified: Principal | ICD-10-CM

## 2023-05-22 NOTE — Unmapped (Signed)
Telephone call to patient to schedule appointment- patient in agreement with date, time, and place.

## 2023-05-26 DIAGNOSIS — Z5181 Encounter for therapeutic drug level monitoring: Principal | ICD-10-CM

## 2023-05-26 DIAGNOSIS — E612 Magnesium deficiency: Principal | ICD-10-CM

## 2023-05-26 DIAGNOSIS — Z944 Liver transplant status: Principal | ICD-10-CM

## 2023-06-02 DIAGNOSIS — Z5181 Encounter for therapeutic drug level monitoring: Principal | ICD-10-CM

## 2023-06-02 DIAGNOSIS — Z944 Liver transplant status: Principal | ICD-10-CM

## 2023-06-02 DIAGNOSIS — E612 Magnesium deficiency: Principal | ICD-10-CM

## 2023-06-03 NOTE — Unmapped (Signed)
[facility] Refill Coordination Note    Specialty Medication(s) to be Shipped:   Transplant: tacrolimus 0.5mg     Other medication(s) to be shipped:  amlodipine,bd pen needle,ozempic,tacrolimus     Matthew Key, DOB: 11-24-1960  Phone: [phone] (home) [phone] (work)      All above HIPAA information was verified with patient.     Was a Nurse, learning disability used for this call? No    Completed refill call assessment today to schedule patient's medication shipment from the [facility]  [phone]).  All relevant notes have been reviewed.     Specialty medication(s) and dose(s) confirmed: Regimen is correct and unchanged.   Changes to medications: Matthew Key reports no changes at this time.  Changes to insurance: No  New side effects reported not previously addressed with a pharmacist or physician: None reported  Questions for the pharmacist: No    Confirmed patient received a Conservation officer, historic buildings and a Surveyor, mining with first shipment. The patient will receive a drug information handout for each medication shipped and additional FDA Medication Guides as required.       DISEASE/MEDICATION-SPECIFIC INFORMATION        N/A    SPECIALTY MEDICATION ADHERENCE     Medication Adherence    Patient reported X missed doses in the last month: 0  Specialty Medication: tacrolimus 0.5 MG capsule (PROGRAF)  Patient is on additional specialty medications: No              Were doses missed due to medication being on hold? No      tacrolimus 0.5 MG capsule (PROGRAF): 14 days of medicine on hand       REFERRAL TO PHARMACIST     Referral to the pharmacist: Not needed      [facility]     Shipping address confirmed in Epic.       Delivery Scheduled: Yes, Expected medication delivery date: 06/11/2023.     Medication will be delivered via UPS to the prescription address in Epic WAM.    [name]  Specialty Technician

## 2023-06-05 NOTE — Unmapped (Signed)
Incoming voicemail received requesting to reschedule PATRN visit scheduled for 06/30/23.    Telephone call to patient to reschedule appointment as requested- patient in agreement with date, time, and place.    06/23/23 @ 1020

## 2023-06-06 NOTE — Unmapped (Signed)
Patient messaged [facility] via epic to inquire if he was supposed to stop taking all the medications that no longer have refills available. Contacted patient, who said his amlodipine, novolog, gabapentin, tamsulosin, duloxetine, and pantoprazole had no more refills. Reiterated with patient, that as discussed at his first annual appt., he will need to see his pcp to manage his non-txp medications going forward and that most of them have already been extended for a few refills beyond that to allow him time to get an appt with his pcp. Encouraged him to make an appt with his pcp as soon as possible. He verbalized understanding.

## 2023-06-07 NOTE — Unmapped (Signed)
error 

## 2023-06-09 DIAGNOSIS — Z944 Liver transplant status: Principal | ICD-10-CM

## 2023-06-09 DIAGNOSIS — Z5181 Encounter for therapeutic drug level monitoring: Principal | ICD-10-CM

## 2023-06-09 DIAGNOSIS — E612 Magnesium deficiency: Principal | ICD-10-CM

## 2023-06-10 MED FILL — TACROLIMUS 0.5 MG CAPSULE, IMMEDIATE-RELEASE: ORAL | 30 days supply | Qty: 180 | Fill #3

## 2023-06-10 MED FILL — BD ULTRA-FINE NANO PEN NEEDLE 32 GAUGE X 5/32" (4 MM): SUBCUTANEOUS | 25 days supply | Qty: 100 | Fill #2

## 2023-06-10 MED FILL — OZEMPIC 0.25 MG OR 0.5 MG (2 MG/3 ML) SUBCUTANEOUS PEN INJECTOR: SUBCUTANEOUS | 28 days supply | Qty: 3 | Fill #6

## 2023-06-16 ENCOUNTER — Ambulatory Visit: Admit: 2023-06-16 | Discharge: 2023-06-17 | Payer: MEDICARE

## 2023-06-16 DIAGNOSIS — Z5181 Encounter for therapeutic drug level monitoring: Principal | ICD-10-CM

## 2023-06-16 DIAGNOSIS — Z944 Liver transplant status: Principal | ICD-10-CM

## 2023-06-16 DIAGNOSIS — E612 Magnesium deficiency: Principal | ICD-10-CM

## 2023-06-16 DIAGNOSIS — I10 Essential (primary) hypertension: Principal | ICD-10-CM

## 2023-06-16 DIAGNOSIS — Z01818 Encounter for other preprocedural examination: Principal | ICD-10-CM

## 2023-06-16 DIAGNOSIS — I8500 Esophageal varices without bleeding: Principal | ICD-10-CM

## 2023-06-16 DIAGNOSIS — K439 Ventral hernia without obstruction or gangrene: Principal | ICD-10-CM

## 2023-06-16 DIAGNOSIS — E119 Type 2 diabetes mellitus without complications: Principal | ICD-10-CM

## 2023-06-16 LAB — BILIRUBIN, DIRECT: BILIRUBIN DIRECT: 0.2 mg/dL (ref 0.00–0.30)

## 2023-06-16 LAB — CBC W/ AUTO DIFF
BASOPHILS ABSOLUTE COUNT: 0 10*9/L (ref 0.0–0.1)
BASOPHILS RELATIVE PERCENT: 0.5 %
EOSINOPHILS ABSOLUTE COUNT: 0.3 10*9/L (ref 0.0–0.5)
EOSINOPHILS RELATIVE PERCENT: 3.7 %
HEMATOCRIT: 40.8 % (ref 39.0–48.0)
HEMOGLOBIN: 13.8 g/dL (ref 12.9–16.5)
LYMPHOCYTES ABSOLUTE COUNT: 1.9 10*9/L (ref 1.1–3.6)
LYMPHOCYTES RELATIVE PERCENT: 23.2 %
MEAN CORPUSCULAR HEMOGLOBIN CONC: 33.7 g/dL (ref 32.0–36.0)
MEAN CORPUSCULAR HEMOGLOBIN: 26.5 pg (ref 25.9–32.4)
MEAN CORPUSCULAR VOLUME: 78.6 fL (ref 77.6–95.7)
MEAN PLATELET VOLUME: 9.3 fL (ref 6.8–10.7)
MONOCYTES ABSOLUTE COUNT: 0.7 10*9/L (ref 0.3–0.8)
MONOCYTES RELATIVE PERCENT: 8 %
NEUTROPHILS ABSOLUTE COUNT: 5.4 10*9/L (ref 1.8–7.8)
NEUTROPHILS RELATIVE PERCENT: 64.6 %
PLATELET COUNT: 195 10*9/L (ref 150–450)
RED BLOOD CELL COUNT: 5.19 10*12/L (ref 4.26–5.60)
RED CELL DISTRIBUTION WIDTH: 15.8 % — ABNORMAL HIGH (ref 12.2–15.2)
WBC ADJUSTED: 8.4 10*9/L (ref 3.6–11.2)

## 2023-06-16 LAB — COMPREHENSIVE METABOLIC PANEL
ALBUMIN: 3.9 g/dL (ref 3.4–5.0)
ALKALINE PHOSPHATASE: 231 U/L — ABNORMAL HIGH (ref 46–116)
ALT (SGPT): 14 U/L (ref 10–49)
ANION GAP: 6 mmol/L (ref 5–14)
AST (SGOT): 15 U/L (ref <=34)
BILIRUBIN TOTAL: 0.7 mg/dL (ref 0.3–1.2)
BLOOD UREA NITROGEN: 17 mg/dL (ref 9–23)
BUN / CREAT RATIO: 11
CALCIUM: 9.6 mg/dL (ref 8.7–10.4)
CHLORIDE: 106 mmol/L (ref 98–107)
CO2: 27 mmol/L (ref 20.0–31.0)
CREATININE: 1.54 mg/dL — ABNORMAL HIGH
EGFR CKD-EPI (2021) MALE: 50 mL/min/{1.73_m2} — ABNORMAL LOW (ref >=60)
GLUCOSE RANDOM: 134 mg/dL (ref 70–179)
POTASSIUM: 4.8 mmol/L (ref 3.4–4.8)
PROTEIN TOTAL: 8.1 g/dL (ref 5.7–8.2)
SODIUM: 139 mmol/L (ref 135–145)

## 2023-06-16 LAB — GAMMA GT: GAMMA GLUTAMYL TRANSFERASE: 63 U/L

## 2023-06-16 LAB — PHOSPHORUS: PHOSPHORUS: 4 mg/dL (ref 2.4–5.1)

## 2023-06-16 LAB — MAGNESIUM: MAGNESIUM: 1.8 mg/dL (ref 1.6–2.6)

## 2023-06-16 NOTE — Unmapped (Signed)
Per Anesthesia's guidelines:    Please take the following medications the morning of your procedure with a sip of water:    Amlodipine  Lipitor  Coreg  Duloxetine  Gabapentin  Flomax  Tacrolimus  Pantoprazole and Tylenol-if needed

## 2023-06-16 NOTE — Unmapped (Signed)
Telephone call to patient to schedule appointment- patient in agreement with date, time, and place.    06/16/23 @ 1120

## 2023-06-17 ENCOUNTER — Ambulatory Visit: Admit: 2023-06-17 | Discharge: 2023-06-23 | Disposition: A | Discharge status: Home / Self Care | Payer: MEDICARE

## 2023-06-17 ENCOUNTER — Encounter: Admit: 2023-06-17 | Payer: MEDICARE | Attending: Student in an Organized Health Care Education/Training Program

## 2023-06-17 ENCOUNTER — Encounter
Admit: 2023-06-17 | Discharge: 2023-06-23 | Payer: MEDICARE | Attending: Student in an Organized Health Care Education/Training Program

## 2023-06-17 ENCOUNTER — Encounter
Admit: 2023-06-17 | Discharge: 2023-06-23 | Payer: MEDICARE | Attending: Student in an Organized Health Care Education/Training Program | Primary: Student in an Organized Health Care Education/Training Program

## 2023-06-17 LAB — TACROLIMUS LEVEL: TACROLIMUS BLOOD: 5.9 ng/mL

## 2023-06-17 MED ADMIN — ondansetron (ZOFRAN) injection: INTRAVENOUS | @ 18:00:00 | Stop: 2023-06-17

## 2023-06-17 MED ADMIN — Propofol (DIPRIVAN) injection: INTRAVENOUS | @ 19:00:00 | Stop: 2023-06-17

## 2023-06-17 MED ADMIN — fentaNYL (PF) (SUBLIMAZE) injection 25 mcg: 25 ug | INTRAVENOUS | @ 21:00:00 | Stop: 2023-06-17

## 2023-06-17 MED ADMIN — insulin regular (HumuLIN,NovoLIN) injection: SUBCUTANEOUS | @ 16:00:00 | Stop: 2023-06-17

## 2023-06-17 MED ADMIN — ROCuronium (ZEMURON) injection: INTRAVENOUS | @ 16:00:00 | Stop: 2023-06-17

## 2023-06-17 MED ADMIN — sugammadex (BRIDION) injection: INTRAVENOUS | @ 18:00:00 | Stop: 2023-06-17

## 2023-06-17 MED ADMIN — fentaNYL (PF) (SUBLIMAZE) injection: INTRAVENOUS | @ 15:00:00 | Stop: 2023-06-17

## 2023-06-17 MED ADMIN — midazolam (VERSED) injection: INTRAVENOUS | @ 15:00:00 | Stop: 2023-06-17

## 2023-06-17 MED ADMIN — phenylephrine (NEO-SYNEPHRINE) injection: INTRAVENOUS | @ 16:00:00 | Stop: 2023-06-17

## 2023-06-17 MED ADMIN — Propofol (DIPRIVAN) injection: INTRAVENOUS | @ 15:00:00 | Stop: 2023-06-17

## 2023-06-17 MED ADMIN — fentaNYL (PF) (SUBLIMAZE) injection 25 mcg: 25 ug | INTRAVENOUS | @ 20:00:00 | Stop: 2023-06-17

## 2023-06-17 MED ADMIN — ROCuronium (ZEMURON) injection: INTRAVENOUS | @ 17:00:00 | Stop: 2023-06-17

## 2023-06-17 MED ADMIN — HYDROmorphone (PF) injection Syrg 0.5 mg: .5 mg | INTRAVENOUS | @ 23:00:00 | Stop: 2023-07-01

## 2023-06-17 MED ADMIN — oxyCODONE (ROXICODONE) immediate release tablet 5 mg: 5 mg | ORAL | @ 22:00:00 | Stop: 2023-07-01

## 2023-06-17 MED ADMIN — succinylcholine chloride (ANECTINE) injection: INTRAVENOUS | @ 15:00:00 | Stop: 2023-06-17

## 2023-06-17 MED ADMIN — propofol (DIPRIVAN) infusion 10 mg/mL: INTRAVENOUS | @ 16:00:00 | Stop: 2023-06-17

## 2023-06-17 MED ADMIN — acetaminophen (TYLENOL) tablet 1,000 mg: 1000 mg | ORAL | @ 22:00:00

## 2023-06-17 MED ADMIN — lidocaine (PF) (XYLOCAINE-MPF) 20 mg/mL (2 %) injection: INTRAVENOUS | @ 15:00:00 | Stop: 2023-06-17

## 2023-06-17 MED ADMIN — Propofol (DIPRIVAN) injection: INTRAVENOUS | @ 18:00:00 | Stop: 2023-06-17

## 2023-06-17 MED ADMIN — electrolyte-A (PLASMA-LYT A) infusion: INTRAVENOUS | @ 15:00:00 | Stop: 2023-06-17

## 2023-06-17 MED ADMIN — fentaNYL (PF) (SUBLIMAZE) injection: INTRAVENOUS | @ 19:00:00 | Stop: 2023-06-17

## 2023-06-17 MED ADMIN — electrolyte-A (PLASMA-LYT A) infusion: INTRAVENOUS | @ 18:00:00 | Stop: 2023-06-17

## 2023-06-17 MED ADMIN — ceFAZolin (ANCEF) injection: INTRAVENOUS | @ 16:00:00 | Stop: 2023-06-17

## 2023-06-17 MED ADMIN — sugammadex (BRIDION) injection: INTRAVENOUS | @ 19:00:00 | Stop: 2023-06-17

## 2023-06-17 MED ADMIN — oxyCODONE (ROXICODONE) immediate release tablet 5 mg: 5 mg | ORAL | @ 23:00:00 | Stop: 2023-07-01

## 2023-06-17 MED ADMIN — gabapentin (NEURONTIN) capsule 300 mg: 300 mg | ORAL | @ 22:00:00

## 2023-06-17 MED ADMIN — methocarbamol (ROBAXIN) tablet 500 mg: 500 mg | ORAL | @ 22:00:00

## 2023-06-17 MED ADMIN — NORepinephrine 8 mg in dextrose 5 % 250 mL (32 mcg/mL) infusion PMB: INTRAVENOUS | @ 16:00:00 | Stop: 2023-06-17

## 2023-06-17 MED ADMIN — dexAMETHasone (DECADRON) 4 mg/mL injection: INTRAVENOUS | @ 16:00:00 | Stop: 2023-06-17

## 2023-06-17 NOTE — Unmapped (Addendum)
OPERATIVE REPORT     ?   PATIENT NAME: Matthew Key        DOB: [birth year] AGE:62 y.o.   MRN: [id number]  ?   DATE OF SURGERY: 06/17/2023   ADMIT DATE: 06/17/2023  PERFORMING SERVICE: [facility] - General, Acute Care, and Trauma Surgery    OPERATING SITE: [facility] in [place], [place]    ?   PREOPERATIVE DIAGNOSIS: incisional, subxiphoid, epigastric, umbilical, and left subcostal hernia.   ?   POSTOPERATIVE DIAGNOSIS:  initial reducible ventral, incisional, subxiphoid, umbilical, and left subcostal hernia.       PROCEDURE:   1.  Robotic assisted ventral hernia repair using a IPUM+ (Intra-peritoneal underlay mesh) technique with Bard 20cm x 15cm Ventralight ST barrier coated polypropylene mesh  (initial, reducible, >10cm: CPT code 81191)  2.  Primary umbilical hernia repair  Reducible <3cm.  (CPT code 47829)    M1M2L1  M3    Surgeons and Role:     * Mazzola Poli de Evlyn Courier, MD - Primary     * Despina Pole, MD - Resident - Assisting  Physician Assistant: Camillia Herter, PA      ANESTHESIOLOGIST: No responsible provider has been recorded for the case.    ANESTHESIA: General endotracheal anesthesia  ?   ESTIMATED BLOOD LOSS: 10 mL  ?   DRAINS: none    IMPLANTS:  Implant Name Type Inv. Item Serial No. Manufacturer Lot No. LRB No. Used Action   Mesh Ventralight Ellipse 15.2x 20.3cm - [id number] Mesh Mesh Ventralight Ellipse 15.2x 20.3cm  BARD DAVOL [id number] N/A 1 Implanted     ?   SPECIMENS: None collected  ?   COMPLICATIONS: None    OPERATIVE FINDINGS:   Hernia location:M1 - Subxiphoidal and M2 - Epigastric; L1 - Subcostal + M3 hernia  Total defects measured 10 cm wide x 5 cm long  Repaired with intraperitoneal 20 cm x 15 cm Ventralight coated mesh  ?   ?   OPERATIVE PROCEDURE: The patient was correctly identified before the procedure and informed consent was reviewed. The patient was taken to the operating room and placed on the operating table in supine position with a safety belt fastened across the thighs.  An initial timeout was performed with members of the surgical and anesthesia teams present.  Preoperative antibiotics was insured to have been given.  SCDs were applied. General endotracheal anesthesia was initiated.  Appropriate lines and a foley catheter were placed. The patient was positioned with all appropriate padding and secured to the table.  He was prepped and draped in the usual sterile fashion.   A second timeout was performed with all members of the surgical and Anesthesia teams present.       We started by obtaining access to the abdomen through the patient umbilical hernia.  An infra umbilical incision was performed and the umbilical hernia and stalk were circumferentially dissected.  We then transected the umbilical hernia that contained only preperitoneal fat and the stalk.  The defect measured approximately 1 cm x 1 cm.  We then inserted a 12 millimeter Hassan trocar through the defect.  Pneumoperitoneum established.  On inspection we noted minimal adhesions intra-abdominally.  We noted that although the patient has a long Mercedes incision, he only had hernia mostly at the center of the [place] and on the left side, with no hernia noted on the right.  Because of that we elected to perform an IPUM procedure rather than a full abdominal  wall reconstruction with bilateral TAR.  We placed 2 additional 8 mm trocars at the level of the umbilicus 1 on the left and 1 on the right.  The robot was then docked.  Omental adhesions were quickly lysed from the anterior abdominal wall with cautery.  We excised some of the preperitoneal fat with cautery in order to obtain flat placement of the mesh.  We then measured the defect that was entirely on the left side and measured 10 cm wide to 5 cm long.  We then proceeded with closure of the defect with a running 0 180 V-Loc.  Pneumoperitoneum had to be decreased in order to achieve closure without tension.  After defect was closed we then elected to place a 20 cm x 15 cm coated Ventralight mesh.  The mesh was placed in the abdomen and a Vicryl suture was placed in the middle of the mesh in order to hold it against abdominal wall and delivered through the abdominal wall with a suture passer.  We had 5 cm of overlap of the hernia on each side.  We then circumferentially sutured the mesh intra-abdominally with a running 2-0 V loc suture.  The mesh laid nicely and flat against the abdominal wall. Omentum was positioned underneath the mesh. The ports were removed and the abdomen was desufflated.  We then closed the 1 cm x 1 cm umbilical hernia with figure-of-eight 0 Vicryl suture.  The umbilical stalk was tacked to the fascia with a 3-0 Vicryl suture.  The skin was closed with 4 Monocryl in a subcuticular fashion.  Dermabond was applied.      Instrument, sponge, and needle counts were correct at the conclusion of the case.       CONDITION & DISPOSITION: PACU - extubated and hemodynamically stable    TIMES:  Elapsed time from incision to procedure finish:180     POST-OP PLANS & INSTRUCTIONS:  general surgery floor     TEACHING SURGEON ATTESTATION:  Please note, Dr. Rolena Infante, the attending surgeon was present and scrubbed for the entire procedure.   ?       Rolena Infante MD   Gastrointestinal and Hernia Surgery

## 2023-06-17 NOTE — Unmapped (Signed)
Brief Operative Note  (CSN: [id number])      Date of Surgery: 06/17/2023    Pre-op Diagnosis: incisional hernia    Post-op Diagnosis: Essential hypertension [I10]  Esophageal varices determined by endoscopy (CMS-HCC) [I85.00]  Type 2 diabetes mellitus without complication, unspecified whether long term insulin use (CMS-HCC) [E11.9]  Liver transplant status (CMS-HCC) [Z94.4]  Ventral hernia without obstruction or gangrene [K43.9]  Preoperative testing [Z01.818]    Procedure(s):  ROBOTIC XI-REPAIR OF ANTERIOR ABDOMINAL HERNIA(S) ANY APPROACH,INITIAL,INCLUDING IMPLANTATION OF MESH OR OTHER PROSTHESIS WHEN PERFORMED,TOTAL LENGTHOF DEFECT(S); 3 CM TO 10 CM,REDUCIBLE: 49593 (CPT??)  Note: Revisions to procedures should be made in chart - see Procedures activity.    Performing Service: General Surgery  Surgeons and Role:     * Mazzola Poli de Evlyn Courier, MD - Primary     * Despina Pole, MD - Resident - Assisting    Assistant: Physician Assistant: Camillia Herter, [place]    Findings: left-sided incisional hernia, small umbilical hernia. Thinned/absent posterior sheath in several areas along incision. Defect measured 5 x 10 cm, covered with Ventralight mesh. Umbilical hernia closed with 0-vicryl figure of eight.    Anesthesia: General    Estimated Blood Loss: 20 mL    Complications: None    Specimens: None collected    Implants:   Implant Name Type Inv. Item Serial No. Manufacturer Lot No. LRB No. Used Action   Mesh Ventralight Ellipse 15.2x 20.3cm - [id number] Mesh Mesh Ventralight Ellipse 15.2x 20.3cm  BARD DAVOL [id number] N/A 1 Implanted       Surgeon Notes: Dr. Normand Sloop was present and scrubbed for the entire procedure    Despina Pole, MD   Date: 06/17/2023  Time: 1:44 PM

## 2023-06-17 NOTE — Unmapped (Signed)
[facility] - GENERAL SURGERY  - New HISTORY AND PHYSICAL Note -  06/17/2023         Requesting Attending Physician:  Rolena Infante, MD  Service Requesting Consult:  General Surgery  Service Providing Consult: [facility] - GEN SURG [facility] GENERAL AND BARIATRIC SURGERY [place]     Assessment:  Patient is a 62 y.o. male with incisional hernia presenting for elective repair. No changes since last seen in clinic.      Plan:  Will proceed with OR for robotic incisional hernia repair with mesh. Possible open.   Consent obtained.       cc: Incisional hernia     History of Present Illness:   Matthew Key is seen in consultation for incisional hernia at the request of Rolena Infante, MD on the General Surgery service.           Allergies:  Allergies   Allergen Reactions    Bee Venom Protein (Honey Bee) Swelling    Venom-Honey Bee Swelling       Medications:  No current facility-administered medications for this encounter.        Past Medical History:    Past Medical History:   Diagnosis Date    AKI (acute kidney injury) (CMS-HCC) 12/14/2020    Anxiety 10/22/2013    Arthritis     Cervical radiculopathy 12/03/2016    Chronic pain disorder     Lower back    Cirrhosis (CMS-HCC)     Dental abscess 10/2020    Duodenal ulcer 12/01/2017    GERD (gastroesophageal reflux disease)     History of transfusion     Hyperlipidemia 10/22/2013    Hypertension     under control with meds and weight loss    Hypertension associated with diabetes (CMS-HCC) 09/25/2021    Hypotension 01/29/2022    Liver disease     Liver transplant status (CMS-HCC) 02/06/2022    Sleep apnea, obstructive     Have machine    Stroke (CMS-HCC)     mild stroke    Type 2 diabetes mellitus with diabetic neuropathy, with long-term current use of insulin (CMS-HCC) 06/09/2014       Past Surgical History:  Past Surgical History:   Procedure Laterality Date    CHG X-RAY FOR BILE DUCT ENDOSCOPY  08/01/2022    Procedure: ENDOSCOPIC CATHETERIZATION OF THE BILIARY DUCTAL SYSTEM, RADIOLOGICAL SUPERVISION AND INTERPRETATION;  Surgeon: Vonda Antigua, MD;  [facility]: [facility];  Service: Gastroenterology    CHG X-RAY FOR BILE DUCT ENDOSCOPY  10/11/2022    Procedure: ENDOSCOPIC CATHETERIZATION OF THE BILIARY DUCTAL SYSTEM, RADIOLOGICAL SUPERVISION AND INTERPRETATION;  Surgeon: Kela Millin, MD;  Location: [facility];  Service: Gastroenterology    CHG X-RAY FOR BILE DUCT ENDOSCOPY  02/03/2023    Procedure: ENDOSCOPIC CATHETERIZATION OF THE BILIARY DUCTAL SYSTEM, RADIOLOGICAL SUPERVISION AND INTERPRETATION;  Surgeon: Vonda Antigua, MD;  Location: GI [facility]URES [facility];  Service: Gastroenterology    KNEE SURGERY      PR CATH PLACE/CORON ANGIO, IMG SUPER/INTERP,R&L HRT CATH, L HRT VENTRIC N/A 12/19/2020    Procedure: CATH LEFT/RIGHT HEART CATHETERIZATION;  Surgeon: Rosana Hoes, MD;  Location: [facility] CATH;  Service: Cardiology    PR COLONOSCOPY FLX DX W/COLLJ SPEC WHEN PFRMD N/A 12/07/2021    Procedure: COLONOSCOPY, FLEXIBLE, PROXIMAL TO SPLENIC FLEXURE; DIAGNOSTIC, W/WO COLLECTION SPECIMEN BY BRUSH OR WASH;  Surgeon: Annie Paras, MD;  Location:  GI PROCEDURES [facility];  Service: Gastroenterology    PR COLONOSCOPY W/BIOPSY SINGLE/MULTIPLE N/A 07/15/2022    Procedure: COLONOSCOPY, FLEXIBLE, PROXIMAL TO SPLENIC FLEXURE; WITH BIOPSY, SINGLE OR MULTIPLE;  Surgeon: Leland Her, MD;  Location: GI [facility]URES [facility];  Service: Gastroenterology    PR ERCP BALLOON DILATE BILIARY/PANC DUCT/AMPULLA EA N/A 08/01/2022    Procedure: ERCP;WITH TRANS-ENDOSCOPIC BALLOON DILATION OF BILIARY/PANCREATIC DUCT(S) OR OF AMPULLA, INCLUDING SPHINCTERECTOMY, WHEN PERFOREMD,EACH DUCT (16109);  Surgeon: Vonda Antigua, MD;  Location: GI [facility]URES [facility];  Service: Gastroenterology    PR ERCP REMOVE FOREIGN BODY/STENT BILIARY/PANC DUCT N/A 10/11/2022    Procedure: ENDOSCOPIC RETROGRADE CHOLANGIOPANCREATOGRAPHY (ERCP); W/ REMOVAL OF FOREIGN BODY/STENT FROM BILIARY/PANCREATIC DUCT(S);  Surgeon: Kela Millin, MD;  Location: [facility]URES [facility];  Service: Gastroenterology    PR ERCP STENT PLACEMENT BILIARY/PANCREATIC DUCT N/A 08/01/2022    Procedure: ENDOSCOPIC RETROGRADE CHOLANGIOPANCREATOGRAPHY (ERCP); WITH PLACEMENT OF ENDOSCOPIC STENT INTO BILIARY OR PANCREATIC DUCT;  Surgeon: Vonda Antigua, MD;  Location: GI PROC[facility]URES [facility];  Service: Gastroenterology    PR ERCP STENT PLACEMENT BILIARY/PANCREATIC DUCT N/A 02/03/2023    Procedure: ENDOSCOPIC RETROGRADE CHOLANGIOPANCREATOGRAPHY (ERCP); WITH PLACEMENT OF ENDOSCOPIC STENT INTO BILIARY OR PANCREATIC DUCT;  Surgeon: Vonda Antigua, MD;  Location: GI [facility]URES [facility];  Service: Gastroenterology    PR ERCP,DIAGNOSTIC N/A 02/03/2023    Procedure: ERCP; DIAGNOSTIC, WITH OR WITHOUT COLLECTION OF SPECIMEN(S) BY BRUSHING OR WASHING;  Surgeon: Vonda Antigua, MD;  Location: GI [facility]URES [facility];  Service: Gastroenterology    PR ERCP,SPHINCTEROTOMY N/A 08/01/2022    Procedure: ERCP; W/SPHINCTEROTOMY/PAPILLOTOMY;  Surgeon: Vonda Antigua, MD;  Location: [facility]URES [facility];  Service: Gastroenterology    PR ERCP,W/REMOVAL STONE,BIL/PANCR DUCTS N/A 10/11/2022    Procedure: ERCP; W/ENDOSCOPIC RETROGRADE REMOVAL OF CALCULUS/CALCULI FROM BILIARY &/OR PANCREATIC DUCTS;  Surgeon: Kela Millin, MD;  Location: GI [facility]URES [facility];  Service: Gastroenterology    PR TRANSPLANT LIVER,ALLOTRANSPLANT N/A 02/06/2022    Procedure: LIVER ALLOTRANSPLANTATION; ORTHOTOPIC, PARTIAL OR WHOLE, FROM CADAVER OR LIVING DONOR, ANY AGE;  Surgeon: Florene Glen, MD;  Location: MAIN OR [place];  Service: Transplant    PR TRANSPLANT,PREP DONOR LIVER/ARTERIAL N/A 02/06/2022    Procedure: BACKBNCH RECONSTRUCT OF CAD/LIVE DONOR LIVER GFT PRIOR TRANSPLANT; ARTERIAL ANASTAMOSIS, EA; Surgeon: Florene Glen, MD;  Location: MAIN OR [place];  Service: Transplant    PR UPPER GI ENDOSCOPY,BIOPSY N/A 10/12/2020    Procedure: UGI ENDOSCOPY; WITH BIOPSY, SINGLE OR MULTIPLE;  Surgeon: Marene Lenz, MD;  Location: [facility];  Service: Gastroenterology    PR UPPER GI ENDOSCOPY,DIAGNOSIS N/A 01/03/2022    Procedure: UGI ENDO, INCLUDE ESOPHAGUS, STOMACH, & DUODENUM &/OR JEJUNUM; DX W/WO COLLECTION SPECIMN, BY BRUSH OR WASH;  Surgeon: Marene Lenz, MD;  Location: [facility]URES [facility];  Service: Gastroenterology    PR UPPER GI ENDOSCOPY,LIGAT VARIX N/A 12/07/2021    Procedure: UGI ENDO; Everlene Balls LIG ESOPH &/OR GASTRIC VARICES;  Surgeon: Annie Paras, MD;  Location: [facility]URES [facility];  Service: Gastroenterology    ROOT CANAL      Front teeth       Family History:    Family History   Problem Relation Age of Onset    Edema Mother     Alzheimer's disease Father     Aortic dissection Brother     Early death Brother     Aneurysm Brother        Social History:    Tobacco  use: denies  Alcohol use: denies  Drug use: denies    Review of Systems  History obtained from the patient   12 point review of systems is otherwise negative except for the pertinent positives and negatives as per HPI.      Objective:   BP 150/79  - Pulse 70  - Temp 36 ??C (96.8 ??F)  - Resp 18  - Wt 94.2 kg (207 lb 10.8 oz)  - SpO2 99%  - BMI 31.58 kg/m??   Estimated body mass index is 31.58 kg/m?? as calculated from the following:    Height as of 05/08/23: 172.7 cm (5' 8).    Weight as of this encounter: 94.2 kg (207 lb 10.8 oz).    Intake/Output last 3 shifts:  No intake/output data recorded.    Physical Exam    General: NAD  HEENT: No scleral icterus  Pulmonary: clear to auscultation  Cardiovascular: no murmurs. No arrhthymias.    Abdomen: nontender. Reducible incisional hernia.   Extremities: pulses present in all extremities.       IMAGING:  No results found.    [facility] Estell Harpin MD   Gastrointestinal and Hernia Surgery

## 2023-06-18 LAB — MAGNESIUM: MAGNESIUM: 1.9 mg/dL (ref 1.6–2.6)

## 2023-06-18 LAB — CBC
HEMATOCRIT: 39.8 % (ref 39.0–48.0)
HEMOGLOBIN: 13.3 g/dL (ref 12.9–16.5)
MEAN CORPUSCULAR HEMOGLOBIN CONC: 33.3 g/dL (ref 32.0–36.0)
MEAN CORPUSCULAR HEMOGLOBIN: 26 pg (ref 25.9–32.4)
MEAN CORPUSCULAR VOLUME: 78.1 fL (ref 77.6–95.7)
MEAN PLATELET VOLUME: 9.3 fL (ref 6.8–10.7)
PLATELET COUNT: 180 10*9/L (ref 150–450)
RED BLOOD CELL COUNT: 5.1 10*12/L (ref 4.26–5.60)
RED CELL DISTRIBUTION WIDTH: 16.4 % — ABNORMAL HIGH (ref 12.2–15.2)
WBC ADJUSTED: 11.6 10*9/L — ABNORMAL HIGH (ref 3.6–11.2)

## 2023-06-18 LAB — BASIC METABOLIC PANEL
ANION GAP: 9 mmol/L (ref 5–14)
BLOOD UREA NITROGEN: 22 mg/dL (ref 9–23)
BUN / CREAT RATIO: 13
CALCIUM: 9.1 mg/dL (ref 8.7–10.4)
CHLORIDE: 103 mmol/L (ref 98–107)
CO2: 29 mmol/L (ref 20.0–31.0)
CREATININE: 1.71 mg/dL — ABNORMAL HIGH
EGFR CKD-EPI (2021) MALE: 44 mL/min/{1.73_m2} — ABNORMAL LOW (ref >=60)
GLUCOSE RANDOM: 250 mg/dL — ABNORMAL HIGH (ref 70–179)
POTASSIUM: 4.2 mmol/L (ref 3.4–4.8)
SODIUM: 141 mmol/L (ref 135–145)

## 2023-06-18 LAB — PHOSPHORUS: PHOSPHORUS: 3.7 mg/dL (ref 2.4–5.1)

## 2023-06-18 LAB — TACROLIMUS LEVEL, TROUGH: TACROLIMUS, TROUGH: 5.2 ng/mL (ref 5.0–15.0)

## 2023-06-18 MED ADMIN — tacrolimus (PROGRAF) capsule 1.5 mg: 1.5 mg | ORAL | @ 14:00:00

## 2023-06-18 MED ADMIN — methocarbamol (ROBAXIN) tablet 500 mg: 500 mg | ORAL | @ 11:00:00

## 2023-06-18 MED ADMIN — carvedilol (COREG) tablet 12.5 mg: 12.5 mg | ORAL | @ 03:00:00

## 2023-06-18 MED ADMIN — pantoprazole (Protonix) EC tablet 40 mg: 40 mg | ORAL | @ 14:00:00

## 2023-06-18 MED ADMIN — acetaminophen (TYLENOL) tablet 1,000 mg: 1000 mg | ORAL | @ 20:00:00

## 2023-06-18 MED ADMIN — oxyCODONE (ROXICODONE) immediate release tablet 10 mg: 10 mg | ORAL | @ 15:00:00 | Stop: 2023-07-01

## 2023-06-18 MED ADMIN — methocarbamol (ROBAXIN) tablet 500 mg: 500 mg | ORAL | @ 18:00:00

## 2023-06-18 MED ADMIN — gabapentin (NEURONTIN) capsule 300 mg: 300 mg | ORAL | @ 14:00:00

## 2023-06-18 MED ADMIN — lidocaine (ASPERCREME) 4 % 1 patch: 1 | TRANSDERMAL | @ 14:00:00

## 2023-06-18 MED ADMIN — acetaminophen (TYLENOL) tablet 1,000 mg: 1000 mg | ORAL | @ 05:00:00

## 2023-06-18 MED ADMIN — carvedilol (COREG) tablet 12.5 mg: 12.5 mg | ORAL | @ 14:00:00

## 2023-06-18 MED ADMIN — ondansetron (ZOFRAN) injection 4 mg: 4 mg | INTRAVENOUS | @ 23:00:00

## 2023-06-18 MED ADMIN — HYDROmorphone (PF) injection Syrg 0.5 mg: .5 mg | INTRAVENOUS | @ 16:00:00 | Stop: 2023-06-18

## 2023-06-18 MED ADMIN — atorvastatin (LIPITOR) tablet 40 mg: 40 mg | ORAL | @ 14:00:00

## 2023-06-18 MED ADMIN — oxyCODONE (ROXICODONE) immediate release tablet 10 mg: 10 mg | ORAL | @ 03:00:00 | Stop: 2023-07-01

## 2023-06-18 MED ADMIN — lidocaine (ASPERCREME) 4 % 1 patch: 1 | TRANSDERMAL | @ 03:00:00

## 2023-06-18 MED ADMIN — oxyCODONE (ROXICODONE) immediate release tablet 10 mg: 10 mg | ORAL | @ 11:00:00 | Stop: 2023-07-01

## 2023-06-18 MED ADMIN — insulin regular (HumuLIN,NovoLIN) injection 0-20 Units: 0-20 [IU] | SUBCUTANEOUS | @ 23:00:00

## 2023-06-18 MED ADMIN — enoxaparin (LOVENOX) syringe 40 mg: 40 mg | SUBCUTANEOUS | @ 14:00:00

## 2023-06-18 MED ADMIN — gabapentin (NEURONTIN) capsule 300 mg: 300 mg | ORAL | @ 03:00:00

## 2023-06-18 MED ADMIN — tacrolimus (PROGRAF) capsule 1.5 mg: 1.5 mg | ORAL | @ 03:00:00

## 2023-06-18 MED ADMIN — acetaminophen (TYLENOL) tablet 1,000 mg: 1000 mg | ORAL | @ 11:00:00

## 2023-06-18 MED ADMIN — amlodipine (NORVASC) tablet 10 mg: 10 mg | ORAL | @ 14:00:00

## 2023-06-18 MED ADMIN — insulin regular (HumuLIN,NovoLIN) injection 0-4 Units: 0-4 [IU] | SUBCUTANEOUS | @ 03:00:00

## 2023-06-18 MED ADMIN — DULoxetine (CYMBALTA) DR capsule 60 mg: 60 mg | ORAL | @ 14:00:00

## 2023-06-18 MED ADMIN — insulin regular (HumuLIN,NovoLIN) injection 0-4 Units: 0-4 [IU] | SUBCUTANEOUS | @ 18:00:00 | Stop: 2023-06-18

## 2023-06-18 MED ADMIN — gabapentin (NEURONTIN) capsule 300 mg: 300 mg | ORAL | @ 20:00:00

## 2023-06-18 MED ADMIN — insulin regular (HumuLIN,NovoLIN) injection 0-4 Units: 0-4 [IU] | SUBCUTANEOUS | @ 13:00:00 | Stop: 2023-06-18

## 2023-06-18 MED ADMIN — methocarbamol (ROBAXIN) tablet 500 mg: 500 mg | ORAL | @ 03:00:00

## 2023-06-18 MED ADMIN — polyethylene glycol (MIRALAX) packet 17 g: 17 g | ORAL | @ 14:00:00 | Stop: 2023-06-18

## 2023-06-18 MED ADMIN — tamsulosin (FLOMAX) 24 hr capsule 0.4 mg: .4 mg | ORAL | @ 14:00:00

## 2023-06-18 MED ADMIN — HYDROmorphone (PF) injection Syrg 0.5 mg: .5 mg | INTRAVENOUS | @ 05:00:00 | Stop: 2023-07-01

## 2023-06-18 NOTE — Unmapped (Signed)
Tacrolimus Therapeutic Monitoring Pharmacy Note    Matthew Key is a 62 y.o. male continuing tacrolimus.     Indication: Liver transplant     Date of Transplant:  01/2022       Prior Dosing Information: Home regimen tacrolimus 1.5 mg BID      Source(s) of information used to determine prior to admission dosing: Clinic Note or Fill HIstory    Goals:  Therapeutic Drug Levels  Tacrolimus trough goal: 4-6 ng/mL (based on chart review)    Additional Clinical Monitoring/Outcomes  Monitor renal function (SCr and urine output) and liver function (LFTs)  Monitor for signs/symptoms of adverse events (e.g., hyperglycemia, hyperkalemia, hypomagnesemia, hypertension, headache, tremor)    Results:   Tacrolimus level: Not applicable (outpatient level 06/16/23 was 5.9 ng/mL- unclear if drawn appropriately)    Pharmacokinetic Considerations and Significant Drug Interactions:  Concurrent hepatotoxic medications: None identified  Concurrent CYP3A4 substrates/inhibitors: None identified  Concurrent nephrotoxic medications: None identified    Assessment/Plan:  Recommendation(s)  Continue current regimen of tacrolimus 1.5 mg BID    Follow-up  Level ordered with AM labs  A pharmacist will continue to monitor and recommend levels as appropriate    Please page service pharmacist with questions/clarifications.    Carmina Miller, PharmD

## 2023-06-18 NOTE — Unmapped (Signed)
ERCP  Procedure #1     Procedure #2   [id number]  MRN   BARON  Endoscopist     Is the patient's health insurance [employer], [employer], [place] [employer] [facility]), [employer] [place], [employer], or [place]?     Urgent procedure     Are you pregnant?     Are you in the process of scheduling or awaiting results of a heart ultrasound, stress test, or catheterization to evaluate new or worsening chest pain, dizziness, or shortness of breath?     Do you take: Plavix (clopidogrel), Coumadin (warfarin), Lovenox (enoxaparin), Pradaxa (dabigatran), Effient (prasugrel), Xarelto (rivaroxaban), Eliquis (apixaban), Pletal (cilostazol), or Brilinta (ticagrelor)?          Did ordering provider indicate how long to hold this medication in the order comments?          Which of the above medications are you taking?          What is the name of the medical practice that manages this medication?          What is the name of the medical provider who manages this medication?     Do you have hemophilia, von Willebrand disease, or low platelets?     Do you have a pacemaker or implanted cardiac defibrillator?     Has a [place] [facility] provider specified the location(s)?     Which location(s) did the [facility] GI provider specify?          [facility]          [place]          [facility]   TRUE  Do you see a liver specialist for chronic liver disease?     Is the procedure indication for variceal screening?     Is procedure indication for variceal banding (this does NOT include variceal screening)?     Have you had a heart attack, stroke or heart stent placement within the past 6 months?     Month of event     Year of event (ONLY ENTER LAST 2 DIGITS)        5  Height (feet)   8  Height (inches)   200  Weight (pounds)   30.4  BMI          Did the ordering provider specify a bowel prep?          What bowel prep was specified?     Do you have an ostomy (bag on your stomach that collects your stool)?          Is it an ileostomy?          Is it a colostomy? Patient doesn't know.     Do you have chronic kidney disease?     Do you have chronic constipation or have you had poor quality bowel preps for past colonoscopies?     Do you have Crohn's disease or ulcerative colitis?     Have you had weight loss surgery?          When you walk around your house or grocery store, do you have to stop and rest due to shortness of breath, chest pain, or light-headedness?     Do you ever use supplemental oxygen?     Have you been hospitalized for cirrhosis of the liver or heart failure in the last 12 months?     Have you been treated for mouth or throat cancer with radiation or surgery?  Have you been told that it is difficult for doctors to insert a breathing tube in you during anesthesia?     Have you had a heart or lung transplant?          Are you on dialysis?     Do you have cirrhosis of the liver?     Do you have myasthenia gravis?     Is the patient a prisoner?   ################# ## ###################################################################################################################   MRN:  [id number]   Anticoag Review  No   Nurse Triage  No   GI clinic consult  No   Procedure(s):  ERCP     0   Endoscopist:  BARON   Urgent:  No   Prep:                    --------------------------- --- ----------------------------------------------------------------------------------------------------------------------------------------------------------------------------   [facility] Locations:  [facility]                  Requested Locations:              ################# ## ###################################################################################################################

## 2023-06-18 NOTE — Unmapped (Signed)
Pt is Matthew Key. VSS, 2 L [place]. No c/o nausea/vomiting. Pain is managed with prn regimen. Regular diet maintained and tolerating well. PIV x1 in place, saline locked. Abdominal binder in place. Pt has not ambulated OOB. No BM. Voiding with urinal, see intake/output. Family at bedside. Bed locked, call bell within reach.   Problem: Adult Inpatient Plan of Care  Goal: Plan of Care Review  Reactivated  Goal: Patient-Specific Goal (Individualized)  Reactivated  Goal: Absence of Hospital-Acquired Illness or Injury  Reactivated  Intervention: Identify and Manage Fall Risk  Recent Flowsheet Documentation  Taken 06/18/2023 0200 by Orpah Greek, RN  Safety Interventions:   lighting adjusted for tasks/safety   low bed   nonskid shoes/slippers when out of bed  Taken 06/18/2023 0000 by Orpah Greek, RN  Safety Interventions:   lighting adjusted for tasks/safety   low bed   nonskid shoes/slippers when out of bed  Taken 06/17/2023 2200 by Orpah Greek, RN  Safety Interventions:   lighting adjusted for tasks/safety   low bed   nonskid shoes/slippers when out of bed  Taken 06/17/2023 2000 by Orpah Greek, RN  Safety Interventions:   lighting adjusted for tasks/safety   low bed   nonskid shoes/slippers when out of bed  Intervention: Prevent Skin Injury  Recent Flowsheet Documentation  Taken 06/17/2023 2000 by Orpah Greek, RN  Positioning for Skin: Supine/Back  Device Skin Pressure Protection: absorbent pad utilized/changed  Intervention: Prevent and Manage VTE (Venous Thromboembolism) Risk  Recent Flowsheet Documentation  Taken 06/17/2023 2130 by Orpah Greek, RN  Anti-Embolism Device Type: SCD, Knee  Anti-Embolism Intervention: On  Anti-Embolism Device Location: BLE  Goal: Optimal Comfort and Wellbeing  Reactivated  Goal: Readiness for Transition of Care  Reactivated  Goal: Rounds/Family Conference  Reactivated     Problem: Wound  Goal: Optimal Coping  Reactivated  Goal: Optimal Functional Ability  Reactivated  Goal: Absence of Infection Signs and Symptoms  Reactivated  Goal: Improved Oral Intake  Reactivated  Goal: Optimal Pain Control and Function  Reactivated  Goal: Skin Health and Integrity  Reactivated  Intervention: Optimize Skin Protection  Recent Flowsheet Documentation  Taken 06/17/2023 2000 by Orpah Greek, RN  Pressure Reduction Techniques: frequent weight shift encouraged  Pressure Reduction Devices: pressure-redistributing mattress utilized  Goal: Optimal Wound Healing  Reactivated

## 2023-06-18 NOTE — Unmapped (Signed)
Surgery Progress Note     Assessment:    Matthew Key is a 62 y.o. male with past medical history significant for HTN, CVA, OLT who presented on 06/17/2023 for robotic ventral hernia repair. Recovering well post-operatively.     Plan:     MMPC  Regular diet  Lovenox for DVT prophylaxis  SSI  Home Prograf    Subjective   Required 2x PRN dilaudid overnight. Tolerating regular diet. Voiding after Foley removal.     Objective     Vitals   Vitals:    06/18/23 1143   BP: 130/68   Pulse: 83   Resp:    Temp: 36.6 ??C (97.9 ??F)   SpO2: 95%       Intake/Output last 24 hours:    Intake/Output Summary (Last 24 hours) at 06/18/2023 1517  Last data filed at 06/18/2023 1156  Gross per 24 hour   Intake 540 ml   Output 1150 ml   Net -610 ml        Physical Exam:  General: comfortable  Neuro: alert, oriented  Cardiovascular: regular rate  Pulm: non-labored on room air  Abdomen: soft, appropriately tender, abdominal binder in place, incisions well-approximated with Dermabond  -----------------------------------------------------     All labs and imaging collected over the past 24 hours reviewed.    Despina Pole, M.D.  General Surgery - PGY5  Pager: [phone]

## 2023-06-19 LAB — ALKALINE PHOSPHATASE: ALKALINE PHOSPHATASE: 212 U/L — ABNORMAL HIGH (ref 46–116)

## 2023-06-19 LAB — AST: AST (SGOT): 14 U/L (ref <=34)

## 2023-06-19 LAB — BILIRUBIN, TOTAL: BILIRUBIN TOTAL: 0.4 mg/dL (ref 0.3–1.2)

## 2023-06-19 LAB — ALT: ALT (SGPT): 11 U/L (ref 10–49)

## 2023-06-19 MED ADMIN — methocarbamol (ROBAXIN) 1,000 mg in sodium chloride (NS) 0.9 % 50 mL IVPB: 1000 mg | INTRAVENOUS | @ 22:00:00

## 2023-06-19 MED ADMIN — tacrolimus (PROGRAF) capsule 1.5 mg: 1.5 mg | ORAL | @ 06:00:00

## 2023-06-19 MED ADMIN — sodium chloride (NS) 0.9 % infusion: 100 mL/h | INTRAVENOUS | @ 22:00:00 | Stop: 2023-06-20

## 2023-06-19 MED ADMIN — insulin regular (HumuLIN,NovoLIN) injection 0-20 Units: 0-20 [IU] | SUBCUTANEOUS | @ 23:00:00

## 2023-06-19 MED ADMIN — ondansetron (ZOFRAN) injection 4 mg: 4 mg | INTRAVENOUS | @ 05:00:00

## 2023-06-19 MED ADMIN — acetaminophen (OFIRMEV) 10 mg/mL injection 1,000 mg: 1000 mg | INTRAVENOUS | @ 15:00:00 | Stop: 2023-06-20

## 2023-06-19 MED ADMIN — insulin regular (HumuLIN,NovoLIN) injection 0-20 Units: 0-20 [IU] | SUBCUTANEOUS | @ 18:00:00

## 2023-06-19 MED ADMIN — lactated Ringers infusion: 75 mL/h | INTRAVENOUS | @ 03:00:00 | Stop: 2023-06-19

## 2023-06-19 MED ADMIN — methocarbamol (ROBAXIN) 1,000 mg in sodium chloride (NS) 0.9 % 50 mL IVPB: 1000 mg | INTRAVENOUS | @ 14:00:00

## 2023-06-19 MED ADMIN — heparin (porcine) 5,000 unit/mL injection 5,000 Units: 5000 [IU] | SUBCUTANEOUS | @ 12:00:00

## 2023-06-19 MED ADMIN — heparin (porcine) 5,000 unit/mL injection 5,000 Units: 5000 [IU] | SUBCUTANEOUS | @ 20:00:00

## 2023-06-19 MED ADMIN — insulin regular (HumuLIN,NovoLIN) injection 0-20 Units: 0-20 [IU] | SUBCUTANEOUS | @ 04:00:00

## 2023-06-19 MED ADMIN — lidocaine (ASPERCREME) 4 % 1 patch: 1 | TRANSDERMAL | @ 15:00:00

## 2023-06-19 MED ADMIN — tacrolimus (PROGRAF) capsule 1.5 mg: 1.5 mg | ORAL | @ 14:00:00

## 2023-06-19 MED ADMIN — acetaminophen (OFIRMEV) 10 mg/mL injection 1,000 mg: 1000 mg | INTRAVENOUS | @ 23:00:00 | Stop: 2023-06-20

## 2023-06-19 MED ADMIN — LORazepam (ATIVAN) injection 0.5 mg: .5 mg | INTRAVENOUS | @ 06:00:00 | Stop: 2023-06-19

## 2023-06-19 MED ADMIN — insulin regular (HumuLIN,NovoLIN) injection 0-20 Units: 0-20 [IU] | SUBCUTANEOUS | @ 14:00:00

## 2023-06-19 MED ADMIN — sodium chloride (NS) 0.9 % infusion: 100 mL/h | INTRAVENOUS | @ 06:00:00 | Stop: 2023-06-20

## 2023-06-19 NOTE — Unmapped (Signed)
NGT okay to use

## 2023-06-19 NOTE — Unmapped (Signed)
Hepatology Consult Service   Initial Consultation         Assessment and Recommendations:   [facility] is a 62 y.o. 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 He did have some elevated liver enzymes, with biopsy negative for rejection. He presented to [facility] for hernia repair 06/17/2023 The patient is seen in consultation at the request of [facility], [facility] ([facility])) for immunosuppression management in a patient s/p OLT.    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 He is currently on tacrolimus 1.5 mg BID, with goal trough of 4-6. He has h/o biliary strictures requiring stenting. Currently, has metal stent in place that was placed 01/2023. Has had elevated liver enzymes in the past, with biopsy negative for rejection. He currently has ileus and has NG tube to LIWS, but is still taking PO meds.    Would continue with his tac at home dose. If he has to be strict NPO, would switch to sublingual tac. If switch to SL, would reduce dose to 1 mg BID    Please get daily LFTs and tac troughs timed for 12 hours after night time dose. Tac trough drawn yesterday was 5.2, which is within goal    --daily LFTs  --daily tac troughs  --if needs to be strict NPO, would switch to SL tac, and reduce to 1 mg BID      Issues Impacting Complexity of Management:  -The patient is on drug therapy with tacrolimus that requires intensive monitoring for toxicity with scheduled CMPs and drug levels    Recommendations discussed with the patient's primary team. We will continue to follow along with you.    Subjective:   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 He has had biliary strictures requiring stenting. Last ERCP in 01/2023 and metal stent was placed. He had a liver biopsy 07/16/2022 as he had elevated liver enzymes, biopsy negative for ACR. He is on tacrolimus 1.5 mg BID for immunosuppression. Presented to [facility] on 06/17/2023 for hernia repair.    [name] is alert and oriented. He currently has NG tube in place to [facility] He says since getting the NG tube, he is feeling better.. he is tolerating clamping of NG tube for meds. He says he is passing flatus.         Objective:   Temp:  [36.5 ??C (97.7 ??F)-36.7 ??C (98.1 ??F)] 36.7 ??C (98.1 ??F)  Heart Rate:  [78-88] 78  Resp:  [18] 18  BP: (130-156)/(65-81) 144/78  SpO2:  [90 %-95 %] 95 %    Gen: WDWN male in NAD, answers questions appropriately  Abdomen: Soft, NTND, no rebound/guarding, no hepatosplenomegaly. NGT to LIWS  Extremities: No edema in the BLEs    Pertinent Labs/Studies:  -tac trough, CMP

## 2023-06-19 NOTE — Unmapped (Signed)
1x IVP pain med ordered for significant pain after ambulation, otherwise pain controlled with oral medications. Ambulating Ax1 to bathroom. Pt reports feeling constipated, aggressive bowel regimen implemented. 1x episode of emesis, doctor notified.  Problem: Adult Inpatient Plan of Care  Goal: Plan of Care Review  Outcome: Progressing  Goal: Patient-Specific Goal (Individualized)  Outcome: Progressing  Goal: Absence of Hospital-Acquired Illness or Injury  Outcome: Progressing  Goal: Optimal Comfort and Wellbeing  Outcome: Progressing  Goal: Readiness for Transition of Care  Outcome: Progressing  Goal: Rounds/Family Conference  Outcome: Progressing     Problem: Wound  Goal: Optimal Coping  Outcome: Progressing  Goal: Optimal Functional Ability  Outcome: Progressing  Goal: Absence of Infection Signs and Symptoms  Outcome: Progressing  Goal: Improved Oral Intake  Outcome: Progressing  Goal: Optimal Pain Control and Function  Outcome: Progressing  Goal: Skin Health and Integrity  Outcome: Progressing  Goal: Optimal Wound Healing  Outcome: Progressing

## 2023-06-19 NOTE — Unmapped (Signed)
Alert and oriented. Nausea and vomiting persists. XR abdomen ordered. NG tube placed to low intermittent suction. Wife at bedside. NPO status. IV fluids started. Plan of care continued. Call bell within reach.   Problem: Wound  Goal: Improved Oral Intake  Outcome: Not Progressing     Problem: Adult Inpatient Plan of Care  Goal: Readiness for Transition of Care  Outcome: Ongoing - Unchanged     Problem: Adult Inpatient Plan of Care  Goal: Absence of Hospital-Acquired Illness or Injury  Outcome: Progressing  Intervention: Identify and Manage Fall Risk  Recent Flowsheet Documentation  Taken 06/19/2023 0000 by Francina Ames, RN  Safety Interventions:   low bed   lighting adjusted for tasks/safety   fall reduction program maintained   room near unit station   nonskid shoes/slippers when out of bed  Taken 06/18/2023 2200 by Francina Ames, RN  Safety Interventions:   lighting adjusted for tasks/safety   low bed   nonskid shoes/slippers when out of bed  Intervention: Prevent Skin Injury  Recent Flowsheet Documentation  Taken 06/19/2023 0000 by Francina Ames, RN  Positioning for Skin:   Right   Supine/Back  Intervention: Prevent Infection  Recent Flowsheet Documentation  Taken 06/19/2023 0000 by Francina Ames, RN  Infection Prevention: hand hygiene promoted  Taken 06/18/2023 2200 by Francina Ames, RN  Infection Prevention: hand hygiene promoted  Goal: Optimal Comfort and Wellbeing  Outcome: Progressing     Problem: Wound  Goal: Optimal Coping  Outcome: Progressing  Goal: Optimal Functional Ability  Outcome: Progressing  Goal: Absence of Infection Signs and Symptoms  Outcome: Progressing  Intervention: Prevent or Manage Infection  Recent Flowsheet Documentation  Taken 06/19/2023 0000 by Francina Ames, RN  Infection Management: aseptic technique maintained  Goal: Optimal Pain Control and Function  Outcome: Progressing

## 2023-06-19 NOTE — Unmapped (Signed)
Surgery Progress Note     Assessment:    Parish Pepitone is a 62 y.o. male with past medical history significant for HTN, CVA, OLT who presented on 06/17/2023 for robotic ventral hernia repair.      Plan:     NG to LIWS  NPO with NS at 75 ml/hr  MMPC  Heparin for DVT prophylaxis  SSI  Home Prograf  Will touch base with transplant medicine today    Subjective   Nausea and emesis overnight. NG inserted with some relief. Abdominal pain improved. Has not passed gas since yesterday.     Objective     Vitals   Vitals:    06/18/23 2311   BP: 152/77   Pulse: 83   Resp:    Temp: 36.5 ??C (97.7 ??F)   SpO2: 90%       Intake/Output last 24 hours:    Intake/Output Summary (Last 24 hours) at 06/19/2023 0754  Last data filed at 06/18/2023 1500  Gross per 24 hour   Intake 780 ml   Output 150 ml   Net 630 ml        Physical Exam:  General: comfortable  Neuro: alert, oriented  Cardiovascular: regular rate  Pulm: non-labored on room air  Abdomen: . NG with light bilious output, improved distension, appropriately tender, abdominal binder in place, incisions well-approximated with Dermabond  -----------------------------------------------------    All labs and imaging collected over the past 24 hours reviewed.    Despina Pole, M.D.  General Surgery - PGY5  Pager: [phone]

## 2023-06-20 LAB — BASIC METABOLIC PANEL
ANION GAP: 2 mmol/L — ABNORMAL LOW (ref 5–14)
BLOOD UREA NITROGEN: 16 mg/dL (ref 9–23)
BUN / CREAT RATIO: 11
CALCIUM: 9.1 mg/dL (ref 8.7–10.4)
CHLORIDE: 110 mmol/L — ABNORMAL HIGH (ref 98–107)
CO2: 29 mmol/L (ref 20.0–31.0)
CREATININE: 1.4 mg/dL — ABNORMAL HIGH
EGFR CKD-EPI (2021) MALE: 56 mL/min/{1.73_m2} — ABNORMAL LOW (ref >=60)
GLUCOSE RANDOM: 142 mg/dL (ref 70–179)
POTASSIUM: 4.2 mmol/L (ref 3.4–4.8)
SODIUM: 141 mmol/L (ref 135–145)

## 2023-06-20 LAB — HEPATIC FUNCTION PANEL
ALBUMIN: 3 g/dL — ABNORMAL LOW (ref 3.4–5.0)
ALKALINE PHOSPHATASE: 171 U/L — ABNORMAL HIGH (ref 46–116)
ALT (SGPT): 10 U/L (ref 10–49)
AST (SGOT): 11 U/L (ref <=34)
BILIRUBIN DIRECT: 0.2 mg/dL (ref 0.00–0.30)
BILIRUBIN TOTAL: 0.5 mg/dL (ref 0.3–1.2)
PROTEIN TOTAL: 5.9 g/dL (ref 5.7–8.2)

## 2023-06-20 LAB — CBC
HEMATOCRIT: 37.2 % — ABNORMAL LOW (ref 39.0–48.0)
HEMOGLOBIN: 12.3 g/dL — ABNORMAL LOW (ref 12.9–16.5)
MEAN CORPUSCULAR HEMOGLOBIN CONC: 33.2 g/dL (ref 32.0–36.0)
MEAN CORPUSCULAR HEMOGLOBIN: 26.4 pg (ref 25.9–32.4)
MEAN CORPUSCULAR VOLUME: 79.7 fL (ref 77.6–95.7)
MEAN PLATELET VOLUME: 9.3 fL (ref 6.8–10.7)
PLATELET COUNT: 144 10*9/L — ABNORMAL LOW (ref 150–450)
RED BLOOD CELL COUNT: 4.67 10*12/L (ref 4.26–5.60)
RED CELL DISTRIBUTION WIDTH: 15.9 % — ABNORMAL HIGH (ref 12.2–15.2)
WBC ADJUSTED: 7.2 10*9/L (ref 3.6–11.2)

## 2023-06-20 LAB — PHOSPHORUS: PHOSPHORUS: 3.6 mg/dL (ref 2.4–5.1)

## 2023-06-20 LAB — MAGNESIUM: MAGNESIUM: 1.6 mg/dL (ref 1.6–2.6)

## 2023-06-20 MED ADMIN — methocarbamol (ROBAXIN) 1,000 mg in sodium chloride (NS) 0.9 % 50 mL IVPB: 1000 mg | INTRAVENOUS | @ 21:00:00

## 2023-06-20 MED ADMIN — heparin (porcine) 5,000 unit/mL injection 5,000 Units: 5000 [IU] | SUBCUTANEOUS | @ 18:00:00

## 2023-06-20 MED ADMIN — lidocaine (ASPERCREME) 4 % 1 patch: 1 | TRANSDERMAL | @ 13:00:00

## 2023-06-20 MED ADMIN — carvedilol (COREG) tablet 12.5 mg: 12.5 mg | ORAL | @ 02:00:00

## 2023-06-20 MED ADMIN — tacrolimus (PROGRAF) capsule 1.5 mg: 1.5 mg | ORAL | @ 02:00:00

## 2023-06-20 MED ADMIN — sodium chloride (NS) 0.9 % infusion: 100 mL/h | INTRAVENOUS | @ 10:00:00 | Stop: 2023-06-21

## 2023-06-20 MED ADMIN — magnesium sulfate 2gm/50mL IVPB: 2 g | INTRAVENOUS | @ 15:00:00 | Stop: 2023-06-20

## 2023-06-20 MED ADMIN — carvedilol (COREG) tablet 12.5 mg: 12.5 mg | ORAL | @ 13:00:00

## 2023-06-20 MED ADMIN — acetaminophen (OFIRMEV) 10 mg/mL injection 1,000 mg: 1000 mg | INTRAVENOUS | @ 13:00:00 | Stop: 2023-06-21

## 2023-06-20 MED ADMIN — heparin (porcine) 5,000 unit/mL injection 5,000 Units: 5000 [IU] | SUBCUTANEOUS | @ 10:00:00

## 2023-06-20 MED ADMIN — diphenhydrAMINE (BENADRYL) capsule/tablet 25 mg: 25 mg | ORAL | @ 03:00:00 | Stop: 2023-06-19

## 2023-06-20 MED ADMIN — heparin (porcine) 5,000 unit/mL injection 5,000 Units: 5000 [IU] | SUBCUTANEOUS | @ 03:00:00

## 2023-06-20 MED ADMIN — melatonin tablet 6 mg: 6 mg | ORAL | @ 03:00:00 | Stop: 2023-06-19

## 2023-06-20 MED ADMIN — tacrolimus (PROGRAF) capsule 1.5 mg: 1.5 mg | ORAL | @ 13:00:00

## 2023-06-20 MED ADMIN — pantoprazole (Protonix) injection 40 mg: 40 mg | INTRAVENOUS | @ 13:00:00

## 2023-06-20 MED ADMIN — tamsulosin (FLOMAX) 24 hr capsule 0.4 mg: .4 mg | ORAL | @ 13:00:00

## 2023-06-20 MED ADMIN — methocarbamol (ROBAXIN) 1,000 mg in sodium chloride (NS) 0.9 % 50 mL IVPB: 1000 mg | INTRAVENOUS | @ 13:00:00

## 2023-06-20 MED ADMIN — HYDROmorphone (PF) injection Syrg 0.5 mg: .5 mg | INTRAVENOUS | @ 02:00:00 | Stop: 2023-07-03

## 2023-06-20 MED ADMIN — acetaminophen (OFIRMEV) 10 mg/mL injection 1,000 mg: 1000 mg | INTRAVENOUS | @ 05:00:00 | Stop: 2023-06-21

## 2023-06-20 MED ADMIN — amlodipine (NORVASC) tablet 10 mg: 10 mg | ORAL | @ 13:00:00

## 2023-06-20 MED ADMIN — methocarbamol (ROBAXIN) 1,000 mg in sodium chloride (NS) 0.9 % 50 mL IVPB: 1000 mg | INTRAVENOUS | @ 06:00:00

## 2023-06-20 MED ADMIN — DULoxetine (CYMBALTA) DR capsule 60 mg: 60 mg | ORAL | @ 13:00:00

## 2023-06-20 MED ADMIN — acetaminophen (OFIRMEV) 10 mg/mL injection 1,000 mg: 1000 mg | INTRAVENOUS | @ 21:00:00 | Stop: 2023-06-21

## 2023-06-20 NOTE — Unmapped (Signed)
Pt is [name]. VSS, 2 L [place]. No c/o nausea/vomiting. Pain is managed with prn regimen. NPO diet maintained. ACHS Q6 protocol maintained. NGT in place, see intake/output, low intermittent suction maintained. PIV x1 in place, infusing NS @ 100. Voiding w/ urinal, see intake/output. No BM. Family is at bedside. Bed locked, call bell within reach.   Problem: Adult Inpatient Plan of Care  Goal: Plan of Care Review  Outcome: Progressing  Goal: Patient-Specific Goal (Individualized)  Outcome: Progressing  Goal: Absence of Hospital-Acquired Illness or Injury  Outcome: Progressing  Intervention: Identify and Manage Fall Risk  Recent Flowsheet Documentation  Taken 06/20/2023 0200 by Orpah Greek, RN  Safety Interventions:   lighting adjusted for tasks/safety   low bed   nonskid shoes/slippers when out of bed  Taken 06/20/2023 0000 by Orpah Greek, RN  Safety Interventions:   lighting adjusted for tasks/safety   low bed   nonskid shoes/slippers when out of bed  Taken 06/19/2023 2200 by Orpah Greek, RN  Safety Interventions:   lighting adjusted for tasks/safety   low bed   nonskid shoes/slippers when out of bed  Taken 06/19/2023 2000 by Orpah Greek, RN  Safety Interventions:   lighting adjusted for tasks/safety   low bed   nonskid shoes/slippers when out of bed  Intervention: Prevent Skin Injury  Recent Flowsheet Documentation  Taken 06/19/2023 2200 by Orpah Greek, RN  Positioning for Skin: Supine/Back  Device Skin Pressure Protection: absorbent pad utilized/changed  Taken 06/19/2023 2045 by Orpah Greek, RN  Positioning for Skin: Supine/Back  Taken 06/19/2023 2000 by Orpah Greek, RN  Positioning for Skin: Supine/Back  Device Skin Pressure Protection: absorbent pad utilized/changed  Intervention: Prevent and Manage VTE (Venous Thromboembolism) Risk  Recent Flowsheet Documentation  Taken 06/19/2023 2045 by Marquette Saa T, RN  Anti-Embolism Device Type: SCD, Knee  Anti-Embolism Intervention: On  Anti-Embolism Device Location: BLE  Goal: Optimal Comfort and Wellbeing  Outcome: Progressing  Goal: Readiness for Transition of Care  Outcome: Progressing  Goal: Rounds/Family Conference  Outcome: Progressing     Problem: Wound  Goal: Optimal Coping  Outcome: Progressing  Goal: Optimal Functional Ability  Outcome: Progressing  Goal: Absence of Infection Signs and Symptoms  Outcome: Progressing  Goal: Improved Oral Intake  Outcome: Progressing  Goal: Optimal Pain Control and Function  Outcome: Progressing  Goal: Skin Health and Integrity  Outcome: Progressing  Intervention: Optimize Skin Protection  Recent Flowsheet Documentation  Taken 06/19/2023 2200 by Orpah Greek, RN  Pressure Reduction Techniques: frequent weight shift encouraged  Pressure Reduction Devices: pressure-redistributing mattress utilized  Taken 06/19/2023 2045 by Orpah Greek, RN  Pressure Reduction Techniques: frequent weight shift encouraged  Taken 06/19/2023 2000 by Orpah Greek, RN  Pressure Reduction Techniques: frequent weight shift encouraged  Pressure Reduction Devices: pressure-redistributing mattress utilized  Goal: Optimal Wound Healing  Outcome: Progressing     Problem: Pain Acute  Goal: Optimal Pain Control and Function  Outcome: Progressing     Problem: Nausea and Vomiting  Goal: Nausea and Vomiting Relief  Outcome: Progressing

## 2023-06-20 NOTE — Unmapped (Signed)
A/O x4. VSS. NPO. Ambulated to bathroom once today. No bm during my shift. Ng-tube in place-marked at 60. Continuous suction with yellow output. MD aware. Voiding adequately. C/o nausea twice throughout the shift, no vomiting. NS @ 143mL/hr.      Problem: Adult Inpatient Plan of Care  Goal: Plan of Care Review  Outcome: Progressing  Flowsheets (Taken 06/19/2023 1826)  Plan of Care Reviewed With: patient  Goal: Patient-Specific Goal (Individualized)  Outcome: Progressing  Goal: Absence of Hospital-Acquired Illness or Injury  Outcome: Progressing  Intervention: Identify and Manage Fall Risk  Recent Flowsheet Documentation  Taken 06/19/2023 0800 by Demetra Shiner, RN  Safety Interventions:   fall reduction program maintained   low bed   lighting adjusted for tasks/safety   nonskid shoes/slippers when out of bed  Intervention: Prevent Skin Injury  Recent Flowsheet Documentation  Taken 06/19/2023 0800 by Demetra Shiner, RN  Positioning for Skin: Supine/Back  Device Skin Pressure Protection:   absorbent pad utilized/changed   adhesive use limited   tubing/devices free from skin contact  Skin Protection:   adhesive use limited   tubing/devices free from skin contact  Intervention: Prevent and Manage VTE (Venous Thromboembolism) Risk  Recent Flowsheet Documentation  Taken 06/19/2023 0800 by Demetra Shiner, RN  Anti-Embolism Device Type: SCD, Knee  Anti-Embolism Intervention: On  Anti-Embolism Device Location: BLE  Goal: Optimal Comfort and Wellbeing  Outcome: Progressing  Goal: Readiness for Transition of Care  Outcome: Progressing  Goal: Rounds/Family Conference  Outcome: Progressing

## 2023-06-20 NOTE — Unmapped (Signed)
Surgery Progress Note     Assessment:    [facility] is a 62 y.o. male with past medical history significant for HTN, CVA, OLT who presented on 06/17/2023 for robotic ventral hernia repair, now with post-operative ileus.     Plan:     NG to LIWS  NPO with NS at 100 ml/hr  MMPC  Heparin for DVT prophylaxis  SSI  Transplant consulted, continue home Prograf    Subjective   NG with 1.1 liters out. Adequate urine output. Ambulating, pain well-controlled.     Objective     Vitals   Vitals:    06/20/23 0507   BP: 159/74   Pulse: 76   Resp: 18   Temp: 36.7 ??C (98.1 ??F)   SpO2: 98%       Intake/Output last 24 hours:    Intake/Output Summary (Last 24 hours) at 06/20/2023 0659  Last data filed at 06/20/2023 0500  Gross per 24 hour   Intake 167 ml   Output 2325 ml   Net -2158 ml        Physical Exam:  General: comfortable  Neuro: alert, oriented  Cardiovascular: regular rate  Pulm: non-labored on room air  Abdomen: . NG with light bilious output, improved distension, appropriately tender, abdominal binder in place, incisions well-approximated with Dermabond  -----------------------------------------------------    All labs and imaging collected over the past 24 hours reviewed.    Despina Pole, M.D.  General Surgery - PGY5  Pager: [phone]

## 2023-06-21 LAB — CBC
HEMATOCRIT: 37.6 % — ABNORMAL LOW (ref 39.0–48.0)
HEMOGLOBIN: 12.4 g/dL — ABNORMAL LOW (ref 12.9–16.5)
MEAN CORPUSCULAR HEMOGLOBIN CONC: 32.9 g/dL (ref 32.0–36.0)
MEAN CORPUSCULAR HEMOGLOBIN: 26.3 pg (ref 25.9–32.4)
MEAN CORPUSCULAR VOLUME: 80 fL (ref 77.6–95.7)
MEAN PLATELET VOLUME: 9.3 fL (ref 6.8–10.7)
PLATELET COUNT: 167 10*9/L (ref 150–450)
RED BLOOD CELL COUNT: 4.7 10*12/L (ref 4.26–5.60)
RED CELL DISTRIBUTION WIDTH: 15.9 % — ABNORMAL HIGH (ref 12.2–15.2)
WBC ADJUSTED: 6.3 10*9/L (ref 3.6–11.2)

## 2023-06-21 LAB — HEPATIC FUNCTION PANEL
ALBUMIN: 3.2 g/dL — ABNORMAL LOW (ref 3.4–5.0)
ALKALINE PHOSPHATASE: 167 U/L — ABNORMAL HIGH (ref 46–116)
ALT (SGPT): <7 U/L — ABNORMAL LOW (ref 10–49)
AST (SGOT): 9 U/L (ref <=34)
BILIRUBIN DIRECT: 0.3 mg/dL (ref 0.00–0.30)
BILIRUBIN TOTAL: 0.6 mg/dL (ref 0.3–1.2)
PROTEIN TOTAL: 6.5 g/dL (ref 5.7–8.2)

## 2023-06-21 LAB — BASIC METABOLIC PANEL
ANION GAP: 8 mmol/L (ref 5–14)
BLOOD UREA NITROGEN: 13 mg/dL (ref 9–23)
BUN / CREAT RATIO: 10
CALCIUM: 9.4 mg/dL (ref 8.7–10.4)
CHLORIDE: 108 mmol/L — ABNORMAL HIGH (ref 98–107)
CO2: 24 mmol/L (ref 20.0–31.0)
CREATININE: 1.28 mg/dL — ABNORMAL HIGH
EGFR CKD-EPI (2021) MALE: 63 mL/min/{1.73_m2} (ref >=60)
GLUCOSE RANDOM: 130 mg/dL (ref 70–179)
POTASSIUM: 4.1 mmol/L (ref 3.4–4.8)
SODIUM: 140 mmol/L (ref 135–145)

## 2023-06-21 LAB — PHOSPHORUS: PHOSPHORUS: 3.6 mg/dL (ref 2.4–5.1)

## 2023-06-21 LAB — TACROLIMUS LEVEL, TROUGH: TACROLIMUS, TROUGH: 3.9 ng/mL — ABNORMAL LOW (ref 5.0–15.0)

## 2023-06-21 LAB — MAGNESIUM: MAGNESIUM: 1.8 mg/dL (ref 1.6–2.6)

## 2023-06-21 MED ADMIN — methocarbamol (ROBAXIN) 1,000 mg in sodium chloride (NS) 0.9 % 50 mL IVPB: 1000 mg | INTRAVENOUS | @ 21:00:00

## 2023-06-21 MED ADMIN — lidocaine (ASPERCREME) 4 % 1 patch: 1 | TRANSDERMAL | @ 13:00:00

## 2023-06-21 MED ADMIN — DULoxetine (CYMBALTA) DR capsule 60 mg: 60 mg | ORAL | @ 13:00:00

## 2023-06-21 MED ADMIN — insulin regular (HumuLIN,NovoLIN) injection 0-20 Units: 0-20 [IU] | SUBCUTANEOUS | @ 23:00:00

## 2023-06-21 MED ADMIN — heparin (porcine) 5,000 unit/mL injection 5,000 Units: 5000 [IU] | SUBCUTANEOUS | @ 02:00:00

## 2023-06-21 MED ADMIN — acetaminophen (OFIRMEV) 10 mg/mL injection 1,000 mg: 1000 mg | INTRAVENOUS | @ 06:00:00 | Stop: 2023-06-21

## 2023-06-21 MED ADMIN — acetaminophen (OFIRMEV) 10 mg/mL injection 1,000 mg: 1000 mg | INTRAVENOUS | @ 13:00:00 | Stop: 2023-06-21

## 2023-06-21 MED ADMIN — carvedilol (COREG) tablet 12.5 mg: 12.5 mg | ORAL | @ 02:00:00

## 2023-06-21 MED ADMIN — amlodipine (NORVASC) tablet 10 mg: 10 mg | ORAL | @ 13:00:00

## 2023-06-21 MED ADMIN — dextrose 5 % in lactated ringers infusion: 100 mL/h | INTRAVENOUS | @ 16:00:00 | Stop: 2023-06-22

## 2023-06-21 MED ADMIN — tacrolimus (PROGRAF) capsule 1.5 mg: 1.5 mg | ORAL | @ 13:00:00 | Stop: 2023-06-21

## 2023-06-21 MED ADMIN — methocarbamol (ROBAXIN) 1,000 mg in sodium chloride (NS) 0.9 % 50 mL IVPB: 1000 mg | INTRAVENOUS | @ 06:00:00

## 2023-06-21 MED ADMIN — heparin (porcine) 5,000 unit/mL injection 5,000 Units: 5000 [IU] | SUBCUTANEOUS | @ 20:00:00

## 2023-06-21 MED ADMIN — tamsulosin (FLOMAX) 24 hr capsule 0.4 mg: .4 mg | ORAL | @ 13:00:00

## 2023-06-21 MED ADMIN — carvedilol (COREG) tablet 12.5 mg: 12.5 mg | ORAL | @ 13:00:00

## 2023-06-21 MED ADMIN — tacrolimus (PROGRAF) capsule 1.5 mg: 1.5 mg | ORAL | @ 02:00:00

## 2023-06-21 MED ADMIN — magnesium sulfate 2gm/50mL IVPB: 2 g | INTRAVENOUS | @ 15:00:00 | Stop: 2023-06-21

## 2023-06-21 MED ADMIN — pantoprazole (Protonix) injection 40 mg: 40 mg | INTRAVENOUS | @ 13:00:00

## 2023-06-21 MED ADMIN — bisacodyl (DULCOLAX) suppository 10 mg: 10 mg | RECTAL | @ 20:00:00 | Stop: 2023-06-21

## 2023-06-21 MED ADMIN — methocarbamol (ROBAXIN) 1,000 mg in sodium chloride (NS) 0.9 % 50 mL IVPB: 1000 mg | INTRAVENOUS | @ 13:00:00

## 2023-06-21 MED ADMIN — acetaminophen (OFIRMEV) 10 mg/mL injection 1,000 mg: 1000 mg | INTRAVENOUS | @ 21:00:00 | Stop: 2023-06-21

## 2023-06-21 MED ADMIN — melatonin tablet 6 mg: 6 mg | ORAL | @ 04:00:00

## 2023-06-21 MED ADMIN — sodium chloride (NS) 0.9 % infusion: 100 mL/h | INTRAVENOUS | @ 01:00:00 | Stop: 2023-06-21

## 2023-06-21 NOTE — Unmapped (Signed)
Surgery Progress Note     Assessment:    [facility] is a 62 y.o. male with past medical history significant for HTN, CVA, OLT who presented on 06/17/2023 for robotic ventral hernia repair, now with post-operative ileus.     Plan:     NG to LIWS  KUB to eval ileus and NGT positioning  NPO with D5NS+K at 100 ml/hr  MMPC  Heparin for DVT prophylaxis  SSI  Transplant consulted, continue home Prograf    Subjective   NG with 1.150 liters out. Adequate urine output. Ambulating, pain well-controlled.     Objective     Vitals   Vitals:    06/21/23 0755   BP: 180/82   Pulse: 78   Resp: 18   Temp: 36.8 ??C (98.2 ??F)   SpO2:        Intake/Output last 24 hours:    Intake/Output Summary (Last 24 hours) at 06/21/2023 1009  Last data filed at 06/21/2023 0815  Gross per 24 hour   Intake 168 ml   Output 2200 ml   Net -2032 ml        Physical Exam:  General: comfortable  Neuro: alert, oriented  Cardiovascular: regular rate  Pulm: non-labored on room air  Abdomen: . NG with light bilious output, improved distension, appropriately tender, abdominal binder in place, incisions well-approximated with Dermabond  -----------------------------------------------------    All labs and imaging collected over the past 24 hours reviewed.    Rico Junker, MD   06/21/2023             Attestation    I saw and evaluated the patient, participating in the key portions of the service.  I reviewed the fellow's note.  I agree with the fellow's findings and plan.     Arman Filter, MD, MPH  06/21/2023  10:30 AM

## 2023-06-21 NOTE — Unmapped (Signed)
Problem: Adult Inpatient Plan of Care  Goal: Plan of Care Review  Outcome: Progressing  Goal: Patient-Specific Goal (Individualized)  Outcome: Progressing  Goal: Absence of Hospital-Acquired Illness or Injury  Outcome: Progressing  Goal: Optimal Comfort and Wellbeing  Outcome: Progressing  Goal: Readiness for Transition of Care  Outcome: Progressing  Goal: Rounds/Family Conference  Outcome: Progressing     Problem: Wound  Goal: Optimal Coping  Outcome: Progressing  Goal: Optimal Functional Ability  Outcome: Progressing  Goal: Absence of Infection Signs and Symptoms  Outcome: Progressing  Goal: Improved Oral Intake  Outcome: Progressing  Goal: Optimal Pain Control and Function  Outcome: Progressing  Goal: Skin Health and Integrity  Outcome: Progressing  Goal: Optimal Wound Healing  Outcome: Progressing     Problem: Pain Acute  Goal: Optimal Pain Control and Function  Outcome: Progressing     Problem: Nausea and Vomiting  Goal: Nausea and Vomiting Relief  Outcome: Progressing

## 2023-06-21 NOTE — Unmapped (Signed)
Tacrolimus Therapeutic Monitoring Pharmacy Note    [facility] is a 62 y.o. male continuing tacrolimus.     Indication: Liver transplant     Date of Transplant:  01/2022       Prior Dosing Information: Home regimen 1.5 mg PO BID      Source(s) of information used to determine prior to admission dosing: Home Medication List    Goals:  Therapeutic Drug Levels  Tacrolimus trough goal: 4-6 ng/mL    Additional Clinical Monitoring/Outcomes  Monitor renal function (SCr and urine output) and liver function (LFTs)  Monitor for signs/symptoms of adverse events (e.g., hyperglycemia, hyperkalemia, hypomagnesemia, hypertension, headache, tremor)    Results:   Tacrolimus level:  3.9 ng/mL, drawn 10 hours post-dose    Pharmacokinetic Considerations and Significant Drug Interactions:  Concurrent hepatotoxic medications: None identified  Concurrent CYP3A4 substrates/inhibitors: None identified  Concurrent nephrotoxic medications: None identified    Assessment/Plan:  Recommendedation(s)  Change to 1 mg SL BID (ordered)    Follow-up  Next level should be ordered on 11/10 at 0800 .   A pharmacist will continue to monitor and recommend levels as appropriate    Please page service pharmacist with questions/clarifications.    Charlotte Crumb, PharmD

## 2023-06-21 NOTE — Unmapped (Signed)
Pt is alert and oriented. V/S stable, afebrile. Pt tried to have bowel movement, but did not produce any stool. IV fluids infusing. NGT intact with low suction. ACHS performed; no insulin coverage needed. Pain management controlled. Surgical sites dry and intact. Safety precautions maintained.     Problem: Adult Inpatient Plan of Care  Goal: Plan of Care Review  Outcome: Progressing  Goal: Patient-Specific Goal (Individualized)  Outcome: Progressing  Goal: Absence of Hospital-Acquired Illness or Injury  Outcome: Progressing  Intervention: Identify and Manage Fall Risk  Recent Flowsheet Documentation  Taken 06/20/2023 0900 by Peyton Bottoms, RN  Safety Interventions:   lighting adjusted for tasks/safety   low bed  Intervention: Prevent Skin Injury  Recent Flowsheet Documentation  Taken 06/20/2023 0830 by Peyton Bottoms, RN  Positioning for Skin: Supine/Back  Device Skin Pressure Protection: absorbent pad utilized/changed  Skin Protection: adhesive use limited  Intervention: Prevent and Manage VTE (Venous Thromboembolism) Risk  Recent Flowsheet Documentation  Taken 06/20/2023 0830 by Peyton Bottoms, RN  Anti-Embolism Device Type: SCD, Knee  Anti-Embolism Intervention: On  Anti-Embolism Device Location: BLE  Intervention: Prevent Infection  Recent Flowsheet Documentation  Taken 06/20/2023 0900 by Peyton Bottoms, RN  Infection Prevention: hand hygiene promoted  Goal: Optimal Comfort and Wellbeing  Outcome: Progressing  Goal: Readiness for Transition of Care  Outcome: Progressing  Goal: Rounds/Family Conference  Outcome: Progressing     Problem: Wound  Goal: Optimal Coping  Outcome: Progressing  Goal: Optimal Functional Ability  Outcome: Progressing  Goal: Absence of Infection Signs and Symptoms  Outcome: Progressing  Intervention: Prevent or Manage Infection  Recent Flowsheet Documentation  Taken 06/20/2023 0900 by Peyton Bottoms, RN  Infection Management: aseptic technique maintained  Isolation Precautions: protective precautions maintained  Goal: Improved Oral Intake  Outcome: Progressing  Goal: Optimal Pain Control and Function  Outcome: Progressing  Goal: Skin Health and Integrity  Outcome: Progressing  Intervention: Optimize Skin Protection  Recent Flowsheet Documentation  Taken 06/20/2023 0830 by Peyton Bottoms, RN  Pressure Reduction Techniques: frequent weight shift encouraged  Pressure Reduction Devices: pressure-redistributing mattress utilized  Skin Protection: adhesive use limited  Goal: Optimal Wound Healing  Outcome: Progressing     Problem: Pain Acute  Goal: Optimal Pain Control and Function  Outcome: Progressing     Problem: Nausea and Vomiting  Goal: Nausea and Vomiting Relief  Outcome: Progressing

## 2023-06-21 NOTE — Unmapped (Signed)
OCCUPATIONAL THERAPY  Evaluation (06/21/23 5784)    Patient Name:  Matthew Key       Medical Record Number: [id number]     Date of Birth: February 09, 1961  Sex: Male      Post-Discharge Occupational Therapy Recommendations: Skilled OT services NOT indicated      Equipment Recommendation  OT DME Recommendations: None       Assessment    Clinical Decision Making: Low Complexity    Assessment: Matthew Key is a 62 y.o. 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 He did have some elevated liver enzymes, with biopsy negative for rejection. He presented to [facility] for hernia repair 06/17/2023.     Patient seen for initial OT evaluation and occupational profile. With consideration of patient's occupational profile, assessment review, level of clinical decision making involved, and intervention plan, patient presents as a low complexity case. At baseline he reports being fairly active and independent with all functional tasks. During session reports mild discomfort incision site and generalized stiffness from lack of mobility however completing functional tasks at Mod I-SBA with occasional assist with LB tasks 2/2 pain. Patient with no acute skilled OT needs at this time, no post acute or DME recommendations. Re-emphasis safety precautions with mobility skills and compensatory strategies with ADL tasks.    Today's Interventions: Other  Today's Interventions: Skills assessed: ROM, strength, coordination, cognition, vision, functional mobility skills, and ADL tasks. Educated on OT role, POC, safety precautions with mobility skills, post-op abdominal for comfort, and compensatory strategies with ADL tasks.    Activity Tolerance During Today's Session  Tolerated treatment well    Plan  Planned Frequency of Treatment: Plan of Care Initiated: 06/21/23  D/C Services Weekly Frequency: D/C Services  Planned Treatment Duration: 06/21/23    Planned Interventions:   (No further acute OT needs at this time)      Barriers to Discharge: None    Subjective    Prior Functional Status At baseline he is independent with all functional tasks without assistive device. Reports being active, drives. Enjoys shopping and running errands. No recent falls. Spouse is primary support system however can't provide lifting assistance.    Medical Tests / Procedures: Reviewed in EPIC       Patient / Caregiver reports: I am so hungry      Past Medical History:   Diagnosis Date    AKI (acute kidney injury) (CMS-HCC) 12/14/2020    Anxiety 10/22/2013    Arthritis     Cervical radiculopathy 12/03/2016    Chronic pain disorder     Lower back    Cirrhosis (CMS-HCC)     Dental abscess 10/2020    Duodenal ulcer 12/01/2017    GERD (gastroesophageal reflux disease)     History of transfusion     Hyperlipidemia 10/22/2013    Hypertension     under control with meds and weight loss    Hypertension associated with diabetes (CMS-HCC) 09/25/2021    Hypotension 01/29/2022    Liver disease     Liver transplant status (CMS-HCC) 02/06/2022    Sleep apnea, obstructive     Have machine    Stroke (CMS-HCC)     mild stroke    Type 2 diabetes mellitus with diabetic neuropathy, with long-term current use of insulin (CMS-HCC) 06/09/2014    Social History     Tobacco Use    Smoking status: Never    Smokeless tobacco: Never  Substance Use Topics    Alcohol use: Not Currently      Past Surgical History:   Procedure Laterality Date    CHG X-RAY FOR BILE DUCT ENDOSCOPY  08/01/2022    Procedure: ENDOSCOPIC CATHETERIZATION OF THE BILIARY DUCTAL SYSTEM, RADIOLOGICAL SUPERVISION AND INTERPRETATION;  Surgeon: Vonda Antigua, MD;  Location: [facility];  Service: Gastroenterology    CHG X-RAY FOR BILE DUCT ENDOSCOPY  10/11/2022    Procedure: ENDOSCOPIC CATHETERIZATION OF THE BILIARY DUCTAL SYSTEM, RADIOLOGICAL SUPERVISION AND INTERPRETATION;  Surgeon: Kela Millin, MD;  Location: [facility];  Service: Gastroenterology    CHG X-RAY FOR BILE DUCT ENDOSCOPY  02/03/2023    Procedure: ENDOSCOPIC CATHETERIZATION OF THE BILIARY DUCTAL SYSTEM, RADIOLOGICAL SUPERVISION AND INTERPRETATION;  Surgeon: Vonda Antigua, MD;  Location: [facility]URES [facility];  Service: Gastroenterology    KNEE SURGERY      PR CATH PLACE/CORON ANGIO, IMG SUPER/INTERP,R&L HRT CATH, L HRT VENTRIC N/A 12/19/2020    Procedure: CATH LEFT/RIGHT HEART CATHETERIZATION;  Surgeon: Rosana Hoes, MD;  Location: [facility] CATH;  Service: Cardiology    PR COLONOSCOPY FLX DX W/COLLJ SPEC WHEN PFRMD N/A 12/07/2021    Procedure: COLONOSCOPY, FLEXIBLE, PROXIMAL TO SPLENIC FLEXURE; DIAGNOSTIC, W/WO COLLECTION SPECIMEN BY BRUSH OR WASH;  Surgeon: Annie Paras, MD;  Location: [facility]URES [facility];  Service: Gastroenterology    PR COLONOSCOPY W/BIOPSY SINGLE/MULTIPLE N/A 07/15/2022    Procedure: COLONOSCOPY, FLEXIBLE, PROXIMAL TO SPLENIC FLEXURE; WITH BIOPSY, SINGLE OR MULTIPLE;  Surgeon: Leland Her, MD;  Location: [facility];  Service: Gastroenterology    PR ERCP BALLOON DILATE BILIARY/PANC DUCT/AMPULLA EA N/A 08/01/2022    Procedure: ERCP;WITH TRANS-ENDOSCOPIC BALLOON DILATION OF BILIARY/PANCREATIC DUCT(S) OR OF AMPULLA, INCLUDING SPHINCTERECTOMY, WHEN PERFOREMD,EACH DUCT (29562);  Surgeon: Vonda Antigua, MD;  Location: [facility]URES [facility];  Service: Gastroenterology    PR ERCP REMOVE FOREIGN BODY/STENT BILIARY/PANC DUCT N/A 10/11/2022    Procedure: ENDOSCOPIC RETROGRADE CHOLANGIOPANCREATOGRAPHY (ERCP); W/ REMOVAL OF FOREIGN BODY/STENT FROM BILIARY/PANCREATIC DUCT(S);  Surgeon: Kela Millin, MD;  Location: GI PROCEDURES [facility];  Service: Gastroenterology    PR ERCP STENT PLACEMENT BILIARY/PANCREATIC DUCT N/A 08/01/2022    Procedure: ENDOSCOPIC RETROGRADE CHOLANGIOPANCREATOGRAPHY (ERCP); WITH PLACEMENT OF ENDOSCOPIC STENT INTO BILIARY OR PANCREATIC DUCT;  Surgeon: Vonda Antigua, MD;  Location: [facility]URES [facility];  Service: Gastroenterology    PR ERCP STENT PLACEMENT BILIARY/PANCREATIC DUCT N/A 02/03/2023    Procedure: ENDOSCOPIC RETROGRADE CHOLANGIOPANCREATOGRAPHY (ERCP); WITH PLACEMENT OF ENDOSCOPIC STENT INTO BILIARY OR PANCREATIC DUCT;  Surgeon: Vonda Antigua, MD;  Location: GI PROC[facility]URES [facility];  Service: Gastroenterology    PR ERCP,DIAGNOSTIC N/A 02/03/2023    Procedure: ERCP; DIAGNOSTIC, WITH OR WITHOUT COLLECTION OF SPECIMEN(S) BY BRUSHING OR WASHING;  Surgeon: Vonda Antigua, MD;  Location: [facility];  Service: Gastroenterology    PR ERCP,SPHINCTEROTOMY N/A 08/01/2022    Procedure: ERCP; W/SPHINCTEROTOMY/PAPILLOTOMY;  Surgeon: Vonda Antigua, MD;  Location: [facility]URES [facility];  Service: Gastroenterology    PR ERCP,W/REMOVAL STONE,BIL/PANCR DUCTS N/A 10/11/2022    Procedure: ERCP; W/ENDOSCOPIC RETROGRADE REMOVAL OF CALCULUS/CALCULI FROM BILIARY &/OR PANCREATIC DUCTS;  Surgeon: Kela Millin, MD;  Location: [facility];  Service: Gastroenterology    PR RPR AA HERNIA 1ST 3-10 CM REDUCIBLE N/A 06/17/2023    Procedure: ROBOTIC XI-REPAIR OF ANTERIOR ABDOMINAL HERNIA(S) ANY APPROACH,INITIAL,INCLUDING IMPLANTATION OF MESH OR OTHER PROSTHESIS WHEN PERFORMED,TOTAL LENGTHOF DEFECT(S); 3 CM TO 10 CM,REDUCIBLE;  Surgeon:  Mazzola Poli de Evlyn Courier, MD;  Location: OR [facility];  Service: General Surgery    PR TRANSPLANT LIVER,ALLOTRANSPLANT N/A 02/06/2022    Procedure: LIVER ALLOTRANSPLANTATION; ORTHOTOPIC, PARTIAL OR WHOLE, FROM CADAVER OR LIVING DONOR, ANY AGE;  Surgeon: Florene Glen, MD;  Location: MAIN OR [place];  Service: Transplant    PR TRANSPLANT,PREP DONOR LIVER/ARTERIAL N/A 02/06/2022    Procedure: BACKBNCH RECONSTRUCT OF CAD/LIVE DONOR LIVER GFT PRIOR TRANSPLANT; ARTERIAL ANASTAMOSIS, EA;  Surgeon: Florene Glen, MD;  Location: MAIN OR [place]; Service: Transplant    PR UPPER GI ENDOSCOPY,BIOPSY N/A 10/12/2020    Procedure: UGI ENDOSCOPY; WITH BIOPSY, SINGLE OR MULTIPLE;  Surgeon: Marene Lenz, MD;  Location: [facility]URES [facility];  Service: Gastroenterology    PR UPPER GI ENDOSCOPY,DIAGNOSIS N/A 01/03/2022    Procedure: UGI ENDO, INCLUDE ESOPHAGUS, STOMACH, & DUODENUM &/OR JEJUNUM; DX W/WO COLLECTION SPECIMN, BY BRUSH OR WASH;  Surgeon: Marene Lenz, MD;  Location: GI PROCEDURES [facility];  Service: Gastroenterology    PR UPPER GI ENDOSCOPY,LIGAT VARIX N/A 12/07/2021    Procedure: UGI ENDO; Everlene Balls LIG ESOPH &/OR GASTRIC VARICES;  Surgeon: Annie Paras, MD;  Location: [facility] PROC[facility]URES [facility];  Service: Gastroenterology    ROOT CANAL      Front teeth    Family History   Problem Relation Age of Onset    Edema Mother     Alzheimer's disease Father     Aortic dissection Brother     Early death Brother     Aneurysm Brother         Bee venom protein (honey bee) and Venom-honey bee     Objective Findings  Precautions / Restrictions  Falls precautions  post-op abdominal for comfort    Weight Bearing  Non-applicable    Required Braces or Orthoses  Non-applicable    Communication Preference  Verbal       Pain  Reports 6/10 incisional area and back (stiffness). RN aware, activities modified accordingly.    Equipment / Environment  Vascular access (PIV, TLC, Port-a-cath, PICC)    Living Situation  Living Environment: Apartment  Lives With: Spouse  Home Living: One level home, Level entry, Standard height toilet, Tub/shower unit, Shower chair with back, Grab bars in shower  Caregiver Identified?: Yes  Caregiver Availability: 24 hours  Caregiver Ability: Supervision  Equipment available at home: Rolling walker, Straight cane, Sock aid, Long-Handled Shoehorn (shower chair with back)     Cognition   Orientation Level:  Oriented x 4   Arousal/Alertness:  Appropriate responses to stimuli   Attention Span:  Appears intact   Memory:  Appears intact   Following Commands:  Follows all commands and directions without difficulty   Safety Judgment:  Good awareness of safety precautions   Awareness of Errors and Problem Solving:  Able to problem solve independently     Vision / Hearing   Vision: Wears glasses for reading only     Hearing: No deficit identified       Hand Function:  Right Hand Function: Right hand grip strength, ROM and coordination WNL  Left Hand Function: Left hand grip strength, ROM and coordination WNL  Hand Dominance: Right    Skin Inspection:  Skin Inspection: Intact where visualized  Skin Inspection comment: Abdominal binder in place    ROM / Strength:  UE ROM/Strength: Left WFL, Right WFL  LE ROM/Strength: Left WFL, Right WFL    Coordination:  Coordination: WFL    Sensation:  RUE Sensation:  RUE intact  LUE Sensation: LUE intact  RLE Sensation: RLE intact  LLE Sensation: LLE intact  Sensory/ Proprioception/ Stereognosis comments: Denies numbness and tingling    Balance:  Sitting Balance comments: Static and dynamic sitting balance are [facility]    Standing Balance comments: Static standing balance: Mod I without assistive device, dynamic standing balance: SBA without assistive device    Functional Mobility  Transfers: Modified Independent  Bed Mobility - Needs Assistance: Verbal assist/cues required (Mod I with HOB elevated, cues for abdominal precautions)  Ambulation: Short household distance ambulation without assistive device with Mod I, decreased step length, noted with some stiffness which improved throughout session.    ADLs  ADLs: Modified Independent (with adaptive equipment with LB tasks primarily 2/2 to discomfort incision site)  IADLs: NT    Vitals / Orthostatics  Vitals/Orthostatics: Prior to session in supine: 161/80(102), bpm:77    Patient at end of session: All needs in reach, In bed, Lines intact, Nurse notified     Occupational Therapy Session Duration  OT Individual [mins]: 10  OT Co-Treatment [mins]: 12 (with Matt Pilo, PT)  Reason for Co-treatment: To safely progress mobility       AM-PAC-Daily Activity  Lower Body Dressing assistance needs: A Little - Minimal/Contact Guard Assist/Supervision  Bathing assistance needs: A Little - Minimal/Contact Guard Assist/Supervision  Toileting assistance needs: A Little - Minimal/Contact Guard Assist/Supervision  Upper Body Dressing assistance needs: None - Modified Independent/Independent  Personal Grooming assistance needs: None - Modified Independent/Independent  Eating Meals assistance needs: None - Modified Independent/Independent    Daily Activity Score: 21    Score (in points): % of Functional Impairment, Limitation, Restriction  6: 100% impaired, limited, restricted  7-8: At least 80%, but less than 100% impaired, limited restricted  9-13: At least 60%, but less than 80% impaired, limited restricted  14-19: At least 40%, but less than 60% impaired, limited restricted  20-22: At least 20%, but less than 40% impaired, limited restricted  23: At least 1%, but less than 20% impaired, limited restricted  24: 0% impaired, limited restricted    I attest that I have reviewed the above information.  Signed: Vania Rea, OT  Filed 06/21/2023

## 2023-06-21 NOTE — Unmapped (Signed)
PHYSICAL THERAPY  Evaluation (06/21/23 0823)          Patient Name:  Matthew Key       Medical Record Number: [id number]   Date of Birth: 1960-10-10  Sex: Male        Post-Discharge Physical Therapy Recommendations:  PT Post Acute Discharge Recommendations: Skilled PT services NOT indicated   Equipment Recommendation  PT DME Recommendations: None          Treatment Diagnosis: Abnormalities of gait and mobility  Treatment Diagnosis: PT evaluation only     Activity Tolerance: Tolerated treatment well     ASSESSMENT  Problem List: Gait deviation      Assessment : Toraino Scopel is a 62 y.o. male with past medical history significant for HTN, CVA, OLT who presented on 06/17/2023 for robotic ventral hernia repair, now with post-operative ileus.     On PT evaluation, pt demonstrated impairments noted above. Pt mobilized with cautious gait which is not unexpected following abdominal surgery.  Overall, pt able to mobilize unassisted and without DME.  Expect pt's gait deviations to reduce with continued mobility and routine recovery. No further PT needs identified in this setting.  No PT needs anticipated post acute discharge.  After a review of the personal factors, comorbidities, clinical presentation, and examination of the number of affected body systems, the patient presents as a low complexity case.         Today's Interventions: Evaluation, pre gait activities for larger amplitude movements during ambulation with improved truncal comfort and stability,  PT education: role of PT and POC, reducing excessive bending/lifting/twisting for comfort       PLAN  Planned Frequency of Treatment: Plan of Care Initiated: 06/21/23  D/C Services Weekly Frequency: D/C Services        Goals:   Patient and Family Goals: None stated    Prognosis:  Excellent  Positive Indicators: PLOF, CLOF  Barriers to Discharge: None     SUBJECTIVE  Communication Preference: Verbal,     Patient reports: Agreeable to PT I haven't eaten in 3 days        Prior Functional Status: At baseline, pt was independent with mobility and ADL without DME.  Pt drives and runs errands.  Pt is medically retired.  Pt denied falls  Equipment available at home: Rolling walker, Straight cane, Sock aid, Long-Handled Shoehorn        Past Medical History:   Diagnosis Date    AKI (acute kidney injury) (CMS-HCC) 12/14/2020    Anxiety 10/22/2013    Arthritis     Cervical radiculopathy 12/03/2016    Chronic pain disorder     Lower back    Cirrhosis (CMS-HCC)     Dental abscess 10/2020    Duodenal ulcer 12/01/2017    GERD (gastroesophageal reflux disease)     History of transfusion     Hyperlipidemia 10/22/2013    Hypertension     under control with meds and weight loss    Hypertension associated with diabetes (CMS-HCC) 09/25/2021    Hypotension 01/29/2022    Liver disease     Liver transplant status (CMS-HCC) 02/06/2022    Sleep apnea, obstructive     Have machine    Stroke (CMS-HCC)     mild stroke    Type 2 diabetes mellitus with diabetic neuropathy, with long-term current use of insulin (CMS-HCC) 06/09/2014            Social History     Tobacco Use  Smoking status: Never    Smokeless tobacco: Never   Substance Use Topics    Alcohol use: Not Currently       Past Surgical History:   Procedure Laterality Date    CHG X-RAY FOR BILE DUCT ENDOSCOPY  08/01/2022    Procedure: ENDOSCOPIC CATHETERIZATION OF THE BILIARY DUCTAL SYSTEM, RADIOLOGICAL SUPERVISION AND INTERPRETATION;  Surgeon: Vonda Antigua, MD;  [facility]: [facility];  Service: Gastroenterology    CHG X-RAY FOR BILE DUCT ENDOSCOPY  10/11/2022    Procedure: ENDOSCOPIC CATHETERIZATION OF THE BILIARY DUCTAL SYSTEM, RADIOLOGICAL SUPERVISION AND INTERPRETATION;  Surgeon: Kela Millin, MD;  Location: [facility];  Service: Gastroenterology    CHG X-RAY FOR BILE DUCT ENDOSCOPY  02/03/2023    Procedure: ENDOSCOPIC CATHETERIZATION OF THE BILIARY DUCTAL SYSTEM, RADIOLOGICAL SUPERVISION AND INTERPRETATION;  Surgeon: Vonda Antigua, MD;  Location: [facility];  Service: Gastroenterology    KNEE SURGERY      PR CATH PLACE/CORON ANGIO, IMG SUPER/INTERP,R&L HRT CATH, L HRT VENTRIC N/A 12/19/2020    Procedure: CATH LEFT/RIGHT HEART CATHETERIZATION;  Surgeon: Rosana Hoes, MD;  Location: [facility] CATH;  Service: Cardiology    PR COLONOSCOPY FLX DX W/COLLJ SPEC WHEN PFRMD N/A 12/07/2021    Procedure: COLONOSCOPY, FLEXIBLE, PROXIMAL TO SPLENIC FLEXURE; DIAGNOSTIC, W/WO COLLECTION SPECIMEN BY BRUSH OR WASH;  Surgeon: Annie Paras, MD;  Location: [facility];  Service: Gastroenterology    PR COLONOSCOPY W/BIOPSY SINGLE/MULTIPLE N/A 07/15/2022    Procedure: COLONOSCOPY, FLEXIBLE, PROXIMAL TO SPLENIC FLEXURE; WITH BIOPSY, SINGLE OR MULTIPLE;  Surgeon: Leland Her, MD;  Location: [facility];  Service: Gastroenterology    PR ERCP BALLOON DILATE BILIARY/PANC DUCT/AMPULLA EA N/A 08/01/2022    Procedure: ERCP;WITH TRANS-ENDOSCOPIC BALLOON DILATION OF BILIARY/PANCREATIC DUCT(S) OR OF AMPULLA, INCLUDING SPHINCTERECTOMY, WHEN PERFOREMD,EACH DUCT ([id number]);  Surgeon: Vonda Antigua, MD;  Location: [facility];  Service: Gastroenterology    PR ERCP REMOVE FOREIGN BODY/STENT BILIARY/PANC DUCT N/A 10/11/2022    Procedure: ENDOSCOPIC RETROGRADE CHOLANGIOPANCREATOGRAPHY (ERCP); W/ REMOVAL OF FOREIGN BODY/STENT FROM BILIARY/PANCREATIC DUCT(S);  Surgeon: Kela Millin, MD;  Location: [facility];  Service: Gastroenterology    PR ERCP STENT PLACEMENT BILIARY/PANCREATIC DUCT N/A 08/01/2022    Procedure: ENDOSCOPIC RETROGRADE CHOLANGIOPANCREATOGRAPHY (ERCP); WITH PLACEMENT OF ENDOSCOPIC STENT INTO BILIARY OR PANCREATIC DUCT;  Surgeon: Vonda Antigua, MD;  Location: [facility];  Service: Gastroenterology    PR ERCP STENT PLACEMENT BILIARY/PANCREATIC DUCT N/A 02/03/2023 Procedure: ENDOSCOPIC RETROGRADE CHOLANGIOPANCREATOGRAPHY (ERCP); WITH PLACEMENT OF ENDOSCOPIC STENT INTO BILIARY OR PANCREATIC DUCT;  Surgeon: Vonda Antigua, MD;  Location: [facility];  Service: Gastroenterology    PR ERCP,DIAGNOSTIC N/A 02/03/2023    Procedure: ERCP; DIAGNOSTIC, WITH OR WITHOUT COLLECTION OF SPECIMEN(S) BY BRUSHING OR WASHING;  Surgeon: Vonda Antigua, MD;  [facility]: [facility];  Service: Gastroenterology    PR ERCP,SPHINCTEROTOMY N/A 08/01/2022    Procedure: ERCP; W/SPHINCTEROTOMY/PAPILLOTOMY;  Surgeon: Vonda Antigua, MD;  [facility]: [facility];  Service: Gastroenterology    PR ERCP,W/REMOVAL STONE,BIL/PANCR DUCTS N/A 10/11/2022    Procedure: ERCP; W/ENDOSCOPIC RETROGRADE REMOVAL OF CALCULUS/CALCULI FROM BILIARY &/OR PANCREATIC DUCTS;  Surgeon: Kela Millin, MD;  Location: [facility];  Service: Gastroenterology    PR RPR AA HERNIA 1ST 3-10 CM REDUCIBLE N/A 06/17/2023    Procedure: ROBOTIC XI-REPAIR OF ANTERIOR ABDOMINAL HERNIA(S) ANY APPROACH,INITIAL,INCLUDING IMPLANTATION OF MESH OR OTHER PROSTHESIS WHEN PERFORMED,TOTAL  LENGTHOF DEFECT(S); 3 CM TO 10 CM,REDUCIBLE;  Surgeon: Rolena Infante, MD;  Location: OR [facility];  Service: General Surgery    PR TRANSPLANT LIVER,ALLOTRANSPLANT N/A 02/06/2022    Procedure: LIVER ALLOTRANSPLANTATION; ORTHOTOPIC, PARTIAL OR WHOLE, FROM CADAVER OR LIVING DONOR, ANY AGE;  Surgeon: Florene Glen, MD;  Location: MAIN OR [place];  Service: Transplant    PR TRANSPLANT,PREP DONOR LIVER/ARTERIAL N/A 02/06/2022    Procedure: BACKBNCH RECONSTRUCT OF CAD/LIVE DONOR LIVER GFT PRIOR TRANSPLANT; ARTERIAL ANASTAMOSIS, EA;  Surgeon: Florene Glen, MD;  Location: MAIN OR ;  Service: Transplant    PR UPPER GI ENDOSCOPY,BIOPSY N/A 10/12/2020    Procedure: UGI ENDOSCOPY; WITH BIOPSY, SINGLE OR MULTIPLE;  Surgeon: Marene Lenz, MD;  Location: [facility];  Service: Gastroenterology    PR UPPER GI ENDOSCOPY,DIAGNOSIS N/A 01/03/2022    Procedure: UGI ENDO, INCLUDE ESOPHAGUS, STOMACH, & DUODENUM &/OR JEJUNUM; DX W/WO COLLECTION SPECIMN, BY BRUSH OR WASH;  Surgeon: Marene Lenz, MD;  Location: [facility];  Service: Gastroenterology    PR UPPER GI ENDOSCOPY,LIGAT VARIX N/A 12/07/2021    Procedure: UGI ENDO; Everlene Balls LIG ESOPH &/OR GASTRIC VARICES;  Surgeon: Annie Paras, MD;  Location: [facility];  Service: Gastroenterology    ROOT CANAL      Front teeth             Family History   Problem Relation Age of Onset    Edema Mother     Alzheimer's disease Father     Aortic dissection Brother     Early death Brother     Aneurysm Brother         Allergies: Bee venom protein (honey bee) and Venom-honey bee                  Objective Findings  Precautions / Restrictions  Precautions: Non-applicable  Weight Bearing Status: Non-applicable  Required Braces or Orthoses: Abdominal Binder     Pain Comments: Pt c/o 6/10 incisional pain at rest as well as some back discomfort (unrated) from being in bed.  Symptoms unchanged or improved following mobility  Medical Tests / Procedures: EMR reviewed, s/p ventral hernia repair 11/5  Equipment / Environment: Vascular access (PIV, TLC, Port-a-cath, PICC), NGT, Telemetry     Vitals/Orthostatics : BP 161/80 (102) at rest, asymptomatic with mobility     Living Situation  Living Environment: Apartment  Lives With: Spouse  Home Living: One level home, Level entry, Standard height toilet, Tub/shower unit, Shower chair with back, Grab bars in shower  Caregiver Identified?: Yes  Caregiver Availability: 24 hours  Caregiver Ability: Supervision      Cognition: WFL, Follows 2-step commands  Orientation: Oriented x4  Visual/Perception: Within Functional Limits  Hearing: No deficit identified     Skin Inspection: Intact where visualized     Upper Extremities  UE ROM: Right WFL, Left WFL  UE Strength: Right WFL, Left WFL    Lower Extremities  LE ROM: Right WFL, Left WFL  LE Strength: Right WFL, Left WFL    Face, Cervical and Trunk ROM  Cervical ROM: WFL  Trunk ROM: WFL     Proprioception: WFL  Sensation: WFL  Posture: WFL    Static Sitting-Level of Assistance: Independent  Dynamic Sitting-Level of Assistance: Archivist Standing-Level of Assistance: Independent  Dynamic Standing - Level of Assistance: Supervision  Standing Balance comments: without DME      Bed Mobility: Supine to  Sit  Supine to Sit assistance level: Independent  Bed Mobility comments: supine <> sitting EOB without hospital bed features independently     Transfers: Sit to Stand  Sit to Stand assistance level: Independent  Transfer comments: sit to stand without AD independently      Gait Level of Assistance: Modified independent, requires aide device or extra time  Gait Distance Ambulated (ft): 250 ft  Skilled Treatment Performed: Pt amb 250' independently with slow but steady gait, decreased rotation, decreased stride length, and mild instability but did not require additional assist or DME.  Gait performance improved over the course of session     Stairs: deferred      Wheelchair Mobility: n/a     Endurance: no acute deficits    Patient at end of session: All needs in reach, In bed, Lines intact, Nurse notified    Physical Therapy Session Duration  PT Individual [mins]: 10  PT Co-Treatment [mins]: 12  Reason for Co-treatment: To safely progress mobility (with Vania Rea, OT)     Medical Staff Made Aware: RN    AM-PAC-6 click  Help currently need turning over In bed?: None - Modified Independent/Independent  Help currently needed sitting down/standing up from chair with arms? : None - Modified Independent/Independent  Help currently needed moving from supine to sitting on edge of bed?: None - Modified Independent/Independent  Help currently needed moving to and from bed from wheelchair?: None - Modified Independent/Independent  Help currently needed walking in a hospital room?: None - Modified Independent/Independent  Help currently needed climbing 3-5 steps with railing?: None - Modified Independent/Independent    Basic Mobility Score 6 click: 24    6 click Score (in points): % of Functional Impairment, Limitation, Restriction  6: 100% impaired, limited, restricted  7-8: At least 80%, but less than 100% impaired, limited restricted  9-13: At least 60%, but less than 80% impaired, limited restricted  14-19: At least 40%, but less than 60% impaired, limited restricted  20-22: At least 20%, but less than 40% impaired, limited restricted  23: At least 1%, but less than 20% impaired, limited restricted  24: 0% impaired, limited restricted        I attest that I have reviewed the above information.  Signed: Mahala Menghini, PT  Filed 06/21/2023

## 2023-06-21 NOTE — Unmapped (Signed)
Pt is [name]. VSS, ind. use of 2 L [place]. Pain is managed with scheduled regimen. NPO diet maintained, pt not tolerating due to hunger and frustration. PIV x1 in place, infusing NS @ 100. NGT in place, new securement, low-intermittent suction, clear/green output, see intake/output. Pt ambulated to bathroom w/ SBA. Voiding w/ urinal, see intake/output. No BM. Bed locked, call bell within reach.   Problem: Adult Inpatient Plan of Care  Goal: Plan of Care Review  Outcome: Progressing  Goal: Patient-Specific Goal (Individualized)  Outcome: Progressing  Goal: Absence of Hospital-Acquired Illness or Injury  Outcome: Progressing  Intervention: Identify and Manage Fall Risk  Recent Flowsheet Documentation  Taken 06/21/2023 0200 by Orpah Greek, RN  Safety Interventions:   lighting adjusted for tasks/safety   low bed   nonskid shoes/slippers when out of bed  Taken 06/21/2023 0000 by Orpah Greek, RN  Safety Interventions:   lighting adjusted for tasks/safety   low bed   nonskid shoes/slippers when out of bed  Taken 06/20/2023 2200 by Orpah Greek, RN  Safety Interventions:   lighting adjusted for tasks/safety   low bed   nonskid shoes/slippers when out of bed  Taken 06/20/2023 2000 by Orpah Greek, RN  Safety Interventions:   lighting adjusted for tasks/safety   low bed   nonskid shoes/slippers when out of bed  Intervention: Prevent Skin Injury  Recent Flowsheet Documentation  Taken 06/20/2023 2200 by Orpah Greek, RN  Positioning for Skin: Supine/Back  Device Skin Pressure Protection: absorbent pad utilized/changed  Taken 06/20/2023 2000 by Orpah Greek, RN  Positioning for Skin: Supine/Back  Device Skin Pressure Protection: absorbent pad utilized/changed  Intervention: Prevent and Manage VTE (Venous Thromboembolism) Risk  Recent Flowsheet Documentation  Taken 06/20/2023 2100 by Marquette Saa T, RN  Anti-Embolism Device Type: SCD, Knee  Anti-Embolism Intervention: On  Anti-Embolism Device Location: BLE  Goal: Optimal Comfort and Wellbeing  Outcome: Progressing  Goal: Readiness for Transition of Care  Outcome: Progressing  Goal: Rounds/Family Conference  Outcome: Progressing     Problem: Wound  Goal: Optimal Coping  Outcome: Progressing  Goal: Optimal Functional Ability  Outcome: Progressing  Goal: Absence of Infection Signs and Symptoms  Outcome: Progressing  Goal: Improved Oral Intake  Outcome: Progressing  Goal: Optimal Pain Control and Function  Outcome: Progressing  Goal: Skin Health and Integrity  Outcome: Progressing  Intervention: Optimize Skin Protection  Recent Flowsheet Documentation  Taken 06/21/2023 0000 by Orpah Greek, RN  Head of Bed [facility]) Positioning: HOB at 20-30 degrees  Taken 06/20/2023 2200 by Orpah Greek, RN  Pressure Reduction Techniques: frequent weight shift encouraged  Pressure Reduction Devices: pressure-redistributing mattress utilized  Taken 06/20/2023 2000 by Orpah Greek, RN  Pressure Reduction Techniques: frequent weight shift encouraged  Pressure Reduction Devices: pressure-redistributing mattress utilized  Goal: Optimal Wound Healing  Outcome: Progressing     Problem: Pain Acute  Goal: Optimal Pain Control and Function  Outcome: Progressing     Problem: Nausea and Vomiting  Goal: Nausea and Vomiting Relief  Outcome: Progressing

## 2023-06-22 LAB — CBC
HEMATOCRIT: 37.1 % — ABNORMAL LOW (ref 39.0–48.0)
HEMOGLOBIN: 12.4 g/dL — ABNORMAL LOW (ref 12.9–16.5)
MEAN CORPUSCULAR HEMOGLOBIN CONC: 33.4 g/dL (ref 32.0–36.0)
MEAN CORPUSCULAR HEMOGLOBIN: 26.1 pg (ref 25.9–32.4)
MEAN CORPUSCULAR VOLUME: 78.2 fL (ref 77.6–95.7)
MEAN PLATELET VOLUME: 8.9 fL (ref 6.8–10.7)
PLATELET COUNT: 174 10*9/L (ref 150–450)
RED BLOOD CELL COUNT: 4.75 10*12/L (ref 4.26–5.60)
RED CELL DISTRIBUTION WIDTH: 15.7 % — ABNORMAL HIGH (ref 12.2–15.2)
WBC ADJUSTED: 6.2 10*9/L (ref 3.6–11.2)

## 2023-06-22 LAB — BASIC METABOLIC PANEL
ANION GAP: 9 mmol/L (ref 5–14)
BLOOD UREA NITROGEN: 9 mg/dL (ref 9–23)
BUN / CREAT RATIO: 8
CALCIUM: 9.6 mg/dL (ref 8.7–10.4)
CHLORIDE: 107 mmol/L (ref 98–107)
CO2: 25 mmol/L (ref 20.0–31.0)
CREATININE: 1.15 mg/dL
EGFR CKD-EPI (2021) MALE: 72 mL/min/{1.73_m2} (ref >=60)
GLUCOSE RANDOM: 182 mg/dL — ABNORMAL HIGH (ref 70–179)
POTASSIUM: 3.6 mmol/L (ref 3.4–4.8)
SODIUM: 141 mmol/L (ref 135–145)

## 2023-06-22 LAB — HEPATIC FUNCTION PANEL
ALBUMIN: 3.2 g/dL — ABNORMAL LOW (ref 3.4–5.0)
ALKALINE PHOSPHATASE: 149 U/L — ABNORMAL HIGH (ref 46–116)
ALT (SGPT): 9 U/L — ABNORMAL LOW (ref 10–49)
AST (SGOT): 11 U/L (ref <=34)
BILIRUBIN DIRECT: 0.2 mg/dL (ref 0.00–0.30)
BILIRUBIN TOTAL: 0.5 mg/dL (ref 0.3–1.2)
PROTEIN TOTAL: 6.5 g/dL (ref 5.7–8.2)

## 2023-06-22 LAB — TACROLIMUS LEVEL, TROUGH: TACROLIMUS, TROUGH: 3.9 ng/mL — ABNORMAL LOW (ref 5.0–15.0)

## 2023-06-22 LAB — PHOSPHORUS: PHOSPHORUS: 3 mg/dL (ref 2.4–5.1)

## 2023-06-22 LAB — MAGNESIUM: MAGNESIUM: 1.5 mg/dL — ABNORMAL LOW (ref 1.6–2.6)

## 2023-06-22 MED ADMIN — heparin (porcine) 5,000 unit/mL injection 5,000 Units: 5000 [IU] | SUBCUTANEOUS | @ 12:00:00

## 2023-06-22 MED ADMIN — methocarbamol (ROBAXIN) 1,000 mg in sodium chloride (NS) 0.9 % 50 mL IVPB: 1000 mg | INTRAVENOUS | @ 13:00:00

## 2023-06-22 MED ADMIN — tacrolimus (PROGRAF) capsule 1 mg: 1 mg | SUBLINGUAL | @ 13:00:00 | Stop: 2023-06-22

## 2023-06-22 MED ADMIN — carvedilol (COREG) tablet 12.5 mg: 12.5 mg | ORAL | @ 02:00:00

## 2023-06-22 MED ADMIN — amlodipine (NORVASC) tablet 10 mg: 10 mg | ORAL | @ 13:00:00

## 2023-06-22 MED ADMIN — tacrolimus (PROGRAF) capsule 1 mg: 1 mg | SUBLINGUAL | @ 02:00:00

## 2023-06-22 MED ADMIN — lidocaine (ASPERCREME) 4 % 1 patch: 1 | TRANSDERMAL | @ 13:00:00

## 2023-06-22 MED ADMIN — insulin regular (HumuLIN,NovoLIN) injection 0-20 Units: 0-20 [IU] | SUBCUTANEOUS | @ 12:00:00

## 2023-06-22 MED ADMIN — pantoprazole (Protonix) injection 40 mg: 40 mg | INTRAVENOUS | @ 13:00:00

## 2023-06-22 MED ADMIN — carvedilol (COREG) tablet 12.5 mg: 12.5 mg | ORAL | @ 13:00:00

## 2023-06-22 MED ADMIN — DULoxetine (CYMBALTA) DR capsule 60 mg: 60 mg | ORAL | @ 13:00:00

## 2023-06-22 MED ADMIN — insulin regular (HumuLIN,NovoLIN) injection 0-20 Units: 0-20 [IU] | SUBCUTANEOUS | @ 23:00:00

## 2023-06-22 MED ADMIN — heparin (porcine) 5,000 unit/mL injection 5,000 Units: 5000 [IU] | SUBCUTANEOUS | @ 19:00:00

## 2023-06-22 MED ADMIN — dextrose 5 % in lactated ringers infusion: 100 mL/h | INTRAVENOUS | @ 12:00:00 | Stop: 2023-06-22

## 2023-06-22 MED ADMIN — methocarbamol (ROBAXIN) 1,000 mg in sodium chloride (NS) 0.9 % 50 mL IVPB: 1000 mg | INTRAVENOUS | @ 06:00:00

## 2023-06-22 MED ADMIN — tamsulosin (FLOMAX) 24 hr capsule 0.4 mg: .4 mg | ORAL | @ 13:00:00

## 2023-06-22 MED ADMIN — heparin (porcine) 5,000 unit/mL injection 5,000 Units: 5000 [IU] | SUBCUTANEOUS | @ 03:00:00

## 2023-06-22 MED ADMIN — methocarbamol (ROBAXIN) 1,000 mg in sodium chloride (NS) 0.9 % 50 mL IVPB: 1000 mg | INTRAVENOUS | @ 22:00:00

## 2023-06-22 MED ADMIN — insulin regular (HumuLIN,NovoLIN) injection 0-20 Units: 0-20 [IU] | SUBCUTANEOUS | @ 19:00:00

## 2023-06-22 NOTE — Unmapped (Signed)
Pt is A&Ox4. Is ambulating independently. VSS, pt had a BM 06/21/2023. IV fluids are infusing in new PIV, as the previous one infiltrated. NG tube is intact on low intermittent suction. Surgical site is CDI. Call light is within reach and bed is adjusted to the lowest setting. Pt has been updated and is aware of continuing plan of care.  Problem: Adult Inpatient Plan of Care  Goal: Plan of Care Review  Outcome: Progressing  Goal: Patient-Specific Goal (Individualized)  Outcome: Progressing  Goal: Absence of Hospital-Acquired Illness or Injury  Outcome: Ongoing - Unchanged  Goal: Optimal Comfort and Wellbeing  Outcome: Progressing  Goal: Readiness for Transition of Care  Outcome: Progressing  Goal: Rounds/Family Conference  Outcome: Progressing     Problem: Wound  Goal: Optimal Coping  Outcome: Progressing  Goal: Optimal Functional Ability  Outcome: Progressing  Goal: Absence of Infection Signs and Symptoms  Outcome: Ongoing - Unchanged  Goal: Improved Oral Intake  Outcome: Progressing  Goal: Optimal Pain Control and Function  Outcome: Progressing  Goal: Skin Health and Integrity  Outcome: Progressing  Goal: Optimal Wound Healing  Outcome: Progressing     Problem: Pain Acute  Goal: Optimal Pain Control and Function  Outcome: Progressing     Problem: Nausea and Vomiting  Goal: Nausea and Vomiting Relief  Outcome: Progressing     Problem: Fall Injury Risk  Goal: Absence of Fall and Fall-Related Injury  Outcome: Ongoing - Unchanged

## 2023-06-22 NOTE — Unmapped (Signed)
 VENOUS ACCESS ULTRASOUND PROCEDURE NOTE    Indications:   Poor venous access.    The Venous Access Team has assessed this patient for the placement of a PIV. Ultrasound guidance was necessary to obtain access.     Procedure Details:  Identity of the patient was confirmed via name, medical record number and date of birth. The availability of the correct equipment was verified.    The vein was identified for ultrasound catheter insertion.  Field was prepared with necessary supplies and equipment.  Probe cover and sterile gel utilized.  Insertion site was prepped with chlorhexidine solution and allowed to dry.  The catheter extension was primed with normal saline. A(n) 22 gauge 1.75 catheter was placed in the L Forearm with 1attempt(s). See LDA for additional details.    Catheter aspirated, 1 mL blood return present. The catheter was then flushed with 10 mL of normal saline. Insertion site cleansed, and dressing applied per manufacturer guidelines. The catheter was inserted with difficulty due to poor vasculature by Melodye Ped, RN.    Primary RN was notified.     Thank you,     Melodye Ped, RN Venous Access Team   [phone]     Workup / Procedure Time:  30 minutes    See vein image in PACs.

## 2023-06-22 NOTE — Unmapped (Signed)
Tacrolimus Therapeutic Monitoring Pharmacy Note    Matthew Key is a 62 y.o. male continuing tacrolimus.     Indication: Liver transplant     Date of Transplant:  01/2022       Prior Dosing Information: Current regimen 1 mg SL BID    Home regimen: 1.5 mg PO BID    Source(s) of information used to determine prior to admission dosing: Home Medication List    Goals:  Therapeutic Drug Levels  Tacrolimus trough goal: 4-6 ng/mL    Additional Clinical Monitoring/Outcomes  Monitor renal function (SCr and urine output) and liver function (LFTs)  Monitor for signs/symptoms of adverse events (e.g., hyperglycemia, hyperkalemia, hypomagnesemia, hypertension, headache, tremor)    Results:   Tacrolimus level:  3.9 ng/mL, drawn 11 hours post-dose    Pharmacokinetic Considerations and Significant Drug Interactions:  Concurrent hepatotoxic medications: None identified  Concurrent CYP3A4 substrates/inhibitors: None identified  Concurrent nephrotoxic medications: None identified    Assessment/Plan:  Recommendedation(s)  Continue current regimen of 1 mg SL BID    Follow-up  Next level should be ordered on 11/11 at 0800 .   A pharmacist will continue to monitor and recommend levels as appropriate    Please page service pharmacist with questions/clarifications.    Charlotte Crumb, PharmD

## 2023-06-22 NOTE — Unmapped (Cosign Needed)
Surgery Progress Note     Assessment:    Matthew Key is a 62 y.o. male with past medical history significant for HTN, CVA, OLT who presented on 06/17/2023 for robotic ventral hernia repair, now with post-operative ileus.     Plan:     NG removed  Advanced to diabetic diet  D5LR at 100 ml/hr, will let expire today  MMPC  Heparin for DVT prophylaxis  SSI  Transplant consulted, continue home Prograf  Anticipate home tomorrow    Subjective   NG with 1.3 liters out, drinking water around the tube. Adequate urine output. Had a large bowel movement yesterday. Ambulating, pain well-controlled.     Objective     Vitals   Vitals:    06/22/23 0747   BP: 136/60   Pulse: 78   Resp: 18   Temp: 37.1 ??C (98.8 ??F)   SpO2: 92%       Intake/Output last 24 hours:    Intake/Output Summary (Last 24 hours) at 06/22/2023 0929  Last data filed at 06/22/2023 0748  Gross per 24 hour   Intake --   Output 3450 ml   Net -3450 ml        Physical Exam:  General: comfortable  Neuro: alert, oriented  Cardiovascular: regular rate  Pulm: non-labored on room air  Abdomen: . NG with light bilious output, improved distension, appropriately tender, abdominal binder in place, incisions well-approximated with Dermabond  -----------------------------------------------------    All labs and imaging collected over the past 24 hours reviewed.    Rico Junker, MD   06/22/2023

## 2023-06-23 DIAGNOSIS — Z944 Liver transplant status: Principal | ICD-10-CM

## 2023-06-23 DIAGNOSIS — E612 Magnesium deficiency: Principal | ICD-10-CM

## 2023-06-23 DIAGNOSIS — Z5181 Encounter for therapeutic drug level monitoring: Principal | ICD-10-CM

## 2023-06-23 LAB — HEPATIC FUNCTION PANEL
ALBUMIN: 3.2 g/dL — ABNORMAL LOW (ref 3.4–5.0)
ALKALINE PHOSPHATASE: 149 U/L — ABNORMAL HIGH (ref 46–116)
ALT (SGPT): 16 U/L (ref 10–49)
AST (SGOT): 20 U/L (ref <=34)
BILIRUBIN DIRECT: 0.2 mg/dL (ref 0.00–0.30)
BILIRUBIN TOTAL: 0.5 mg/dL (ref 0.3–1.2)
PROTEIN TOTAL: 6.6 g/dL (ref 5.7–8.2)

## 2023-06-23 LAB — TACROLIMUS LEVEL, TROUGH: TACROLIMUS, TROUGH: 2.8 ng/mL — ABNORMAL LOW (ref 5.0–15.0)

## 2023-06-23 MED ORDER — OXYCODONE 5 MG TABLET
ORAL_TABLET | Freq: Four times a day (QID) | ORAL | 0 refills | 2.00000 days | Status: CP | PRN
Start: 2023-06-23 — End: ?
  Filled 2023-06-23: qty 5, 2d supply, fill #0

## 2023-06-23 MED ORDER — ACETAMINOPHEN 500 MG TABLET
ORAL_TABLET | Freq: Four times a day (QID) | ORAL | 0 refills | 8.00000 days | Status: CP | PRN
Start: 2023-06-23 — End: ?
  Filled 2023-06-23: qty 30, 8d supply, fill #0

## 2023-06-23 MED ORDER — METHOCARBAMOL 500 MG TABLET
ORAL_TABLET | Freq: Three times a day (TID) | ORAL | 0 refills | 3.00000 days | Status: CP | PRN
Start: 2023-06-23 — End: ?
  Filled 2023-06-23: qty 15, 3d supply, fill #0

## 2023-06-23 MED ADMIN — heparin (porcine) 5,000 unit/mL injection 5,000 Units: 5000 [IU] | SUBCUTANEOUS | @ 02:00:00

## 2023-06-23 MED ADMIN — melatonin tablet 6 mg: 6 mg | ORAL | @ 04:00:00

## 2023-06-23 MED ADMIN — amlodipine (NORVASC) tablet 10 mg: 10 mg | ORAL | @ 15:00:00 | Stop: 2023-06-23

## 2023-06-23 MED ADMIN — heparin (porcine) 5,000 unit/mL injection 5,000 Units: 5000 [IU] | SUBCUTANEOUS | @ 10:00:00 | Stop: 2023-06-23

## 2023-06-23 MED ADMIN — tacrolimus (PROGRAF) capsule 1.5 mg: 1.5 mg | ORAL | @ 15:00:00 | Stop: 2023-06-23

## 2023-06-23 MED ADMIN — DULoxetine (CYMBALTA) DR capsule 60 mg: 60 mg | ORAL | @ 15:00:00 | Stop: 2023-06-23

## 2023-06-23 MED ADMIN — carvedilol (COREG) tablet 12.5 mg: 12.5 mg | ORAL | @ 15:00:00 | Stop: 2023-06-23

## 2023-06-23 MED ADMIN — carvedilol (COREG) tablet 12.5 mg: 12.5 mg | ORAL | @ 02:00:00

## 2023-06-23 MED ADMIN — methocarbamol (ROBAXIN) 1,000 mg in sodium chloride (NS) 0.9 % 50 mL IVPB: 1000 mg | INTRAVENOUS | @ 04:00:00

## 2023-06-23 MED ADMIN — tamsulosin (FLOMAX) 24 hr capsule 0.4 mg: .4 mg | ORAL | @ 15:00:00 | Stop: 2023-06-23

## 2023-06-23 MED ADMIN — tacrolimus (PROGRAF) capsule 1.5 mg: 1.5 mg | ORAL | @ 02:00:00

## 2023-06-23 NOTE — Unmapped (Signed)
Patient vss, pain controlled with scheduled medication.  NGT removed by MD this morning. Diet advancement (regular) order followed the  bedside procedure.  No complaints of n/v or emesis noted during the shift.  He ambulated in his room independently throughout the shift. No falls, adequate urine output.  Bed low locked, dvt prophalaxis continued. Care plan progressing.   Problem: Adult Inpatient Plan of Care  Goal: Plan of Care Review  Outcome: Progressing  Goal: Patient-Specific Goal (Individualized)  Outcome: Progressing  Goal: Absence of Hospital-Acquired Illness or Injury  Outcome: Progressing  Goal: Optimal Comfort and Wellbeing  Outcome: Progressing  Goal: Readiness for Transition of Care  Outcome: Progressing  Goal: Rounds/Family Conference  Outcome: Progressing     Problem: Wound  Goal: Optimal Coping  Outcome: Progressing  Goal: Optimal Functional Ability  Outcome: Progressing  Goal: Absence of Infection Signs and Symptoms  Outcome: Progressing  Goal: Improved Oral Intake  Outcome: Progressing  Goal: Optimal Pain Control and Function  Outcome: Progressing  Goal: Skin Health and Integrity  Outcome: Progressing  Goal: Optimal Wound Healing  Outcome: Progressing     Problem: Pain Acute  Goal: Optimal Pain Control and Function  Outcome: Progressing     Problem: Nausea and Vomiting  Goal: Nausea and Vomiting Relief  Outcome: Progressing     Problem: Fall Injury Risk  Goal: Absence of Fall and Fall-Related Injury  Outcome: Progressing

## 2023-06-23 NOTE — Unmapped (Signed)
Discharge Summary    Admit date: 06/17/2023    Discharge date and time: 06/23/23    Discharge to:  Home    Discharge Service: Henreitta Leber Upper [phone])    Discharge Attending Physician: Rolena Infante, MD    Discharge Diagnoses: ventral hernia repair, post-operative ileus    Secondary Diagnosis: Active Problems:    Type 2 diabetes mellitus without complication (CMS-HCC)    Essential hypertension    Esophageal varices determined by endoscopy (CMS-HCC)    Liver transplant status (CMS-HCC)    Ventral hernia without obstruction or gangrene    Preoperative testing      OR Procedures: ROBOTIC XI-REPAIR OF ANTERIOR ABDOMINAL HERNIA(S) ANY APPROACH,INITIAL,INCLUDING IMPLANTATION OF MESH OR OTHER PROSTHESIS WHEN PERFORMED,TOTAL LENGTHOF DEFECT(S); 3 CM TO 10 CM,REDUCIBLE 06/17/2023     Ancillary Procedures: none    Discharge Day Services: The patient was examined by the SRZ service on day of discharge and deemed suitable for discharge home. Discharge instructions were provided and return precautions were discussed.    Subjective   Tolerating regular diet. No nausea, pain well-controlled.    Objective   Patient Vitals for the past 8 hrs:   BP Temp Temp src Pulse Resp SpO2   06/23/23 0400 136/71 36.8 ??C (98.2 ??F) Oral 65 18 96 %     No intake/output data recorded.    General: lying in bed, no acute distress   Neuro: alert and oriented, answers questions appropriately   CV: regular rate  Pulm: non-labored respirations on room air   Abdomen: soft, incisions well-approximated with skin glue, appropriately tender, non-distended, abdominal binder in place    Hospital Course: Matthew Key was admitted on 06/17/2023 after robotic IPOM for repair of a ventral hernia. He tolerated the procedure well and was transferred to the floor for routine care. He developed an ileus on post-op day 1 and an NG tube was inserted for decompression. This was removed after return of bowel function on POD4 and his diet was advanced to regular, which was well-tolerated. Transplant medicine was consulted and followed. On day of discharge he was voiding adequately and his pain was well-controlled. He will follow up in clinic as scheduled.     Condition at Discharge: Improved  Discharge Medications:      Your Medication List        ASK your doctor about these medications      ACCU-CHEK GUIDE GLUCOSE METER Misc  Generic drug: blood-glucose meter  Use to test blood sugar as directed     ACCU-CHEK GUIDE GLUCOSE METER Misc  Generic drug: blood-glucose meter  Use as instructed     ACCU-CHEK GUIDE TEST STRIPS Strp  Generic drug: blood sugar diagnostic  Use to check blood sugar as directed with insulin 3 times a day & for symptoms of high or low blood sugar     ACCU-CHEK SOFTCLIX LANCETS lancets  Generic drug: lancets  Use to check blood sugar as directed with insulin 3 times a day & for symptoms of high or low blood sugar.     acetaminophen 325 MG tablet  Commonly known as: TYLENOL  Take 1-2 tablets (325-650 mg total) by mouth every six (6) hours as needed for pain or fever.     amlodipine 10 MG tablet  Commonly known as: NORVASC  Take 1 tablet (10 mg total) by mouth daily.     aspirin 81 MG tablet  Commonly known as: ECOTRIN  Take 1 tablet (81 mg total)  by mouth daily.     atorvastatin 40 MG tablet  Commonly known as: LIPITOR  Take 1 tablet (40 mg total) by mouth daily.     BD ULTRA-FINE NANO PEN NEEDLE 32 gauge x 5/32 (4 mm) Ndle  Generic drug: pen needle, diabetic  Inject 1 each under the skin Four (4) times a day with a meal and nightly. Use as directed  1-2 times per day     carvedilol 12.5 MG tablet  Commonly known as: COREG  Take 1 tablet (12.5 mg total) by mouth two (2) times a day.     DULoxetine 30 MG capsule  Commonly known as: CYMBALTA  Take 2 capsules (60 mg total) by mouth daily.     gabapentin 300 MG capsule  Commonly known as: NEURONTIN  Take 1 capsule (300 mg total) by mouth Three (3) times a day.     melatonin 3 mg Tab  Take 2 tablets (6 mg total) by mouth.     NovoLOG Flexpen U-100 Insulin 100 unit/mL (3 mL) injection pen  Generic drug: insulin aspart  Inject under the skin per sliding scale prior to meals. If premeal BG 151-200 take 2 additional units for correction, if 201-250 take 4 additional units, etc. Max dose 36 units per day.  Store in-use prefilled pens at room temperature <86??F and use within 28 days; do not refrigerate.     OZEMPIC 0.25 mg or 0.5 mg (2 mg/3 mL) Pnij  Generic drug: semaglutide  Inject 0.5 mg under the skin every seven (7) days.     pantoprazole 40 MG tablet  Commonly known as: Protonix  Take 1 tablet (40 mg total) by mouth daily as needed.     tacrolimus 0.5 MG capsule  Commonly known as: PROGRAF  Take 3 capsules (1.5 mg total) by mouth two (2) times a day.  Ask about: Which instructions should I use?     tamsulosin 0.4 mg capsule  Commonly known as: FLOMAX  Take 1 capsule (0.4 mg total) by mouth every morning.     TRESIBA FLEXTOUCH U-100 100 unit/mL (3 mL) Inpn  Generic drug: insulin degludec  Inject 0.15 mL (15 Units total) under the skin daily. Adjust as instructed.              Pending Test Results: none    Discharge Instructions:  Other Instructions:    Labs and Other Follow-ups after Discharge:    Future Appointments:  Appointments which have been scheduled for you      Jul 04, 2023 10:30 AM  (Arrive by 10:00 AM)  POST OP with Rolena Infante, MD  [facility] GENERAL AND ACUTE CARE SURGERY [facility]) [address]  [place] [address]  [phone]        Jul 22, 2023  ERCP; DIAGNOSTIC, WITH OR WITHOUT COLLECTION OF SPECIMEN(S) BY BRUSHING OR WASHING with Vonda Antigua, MD  GI PERIOP [facility]) [address]  [place] [address]  [phone]

## 2023-06-23 NOTE — Unmapped (Signed)
Problem: Adult Inpatient Plan of Care  Goal: Plan of Care Review  Outcome: Ongoing - Unchanged  Goal: Patient-Specific Goal (Individualized)  Outcome: Ongoing - Unchanged  Goal: Absence of Hospital-Acquired Illness or Injury  Outcome: Ongoing - Unchanged  Goal: Optimal Comfort and Wellbeing  Outcome: Ongoing - Unchanged  Goal: Readiness for Transition of Care  Outcome: Ongoing - Unchanged  Goal: Rounds/Family Conference  Outcome: Ongoing - Unchanged     Problem: Wound  Goal: Optimal Coping  Outcome: Ongoing - Unchanged  Goal: Optimal Functional Ability  Outcome: Ongoing - Unchanged  Goal: Absence of Infection Signs and Symptoms  Outcome: Ongoing - Unchanged  Goal: Improved Oral Intake  Outcome: Ongoing - Unchanged  Goal: Optimal Pain Control and Function  Outcome: Ongoing - Unchanged  Goal: Skin Health and Integrity  Outcome: Ongoing - Unchanged  Goal: Optimal Wound Healing  Outcome: Ongoing - Unchanged     Problem: Pain Acute  Goal: Optimal Pain Control and Function  Outcome: Ongoing - Unchanged  Intervention: Prevent or Manage Pain  Recent Flowsheet Documentation  Taken 06/22/2023 2200 by Demetrio Lapping, RN  Sensory Stimulation Regulation: television on     Problem: Nausea and Vomiting  Goal: Nausea and Vomiting Relief  Outcome: Ongoing - Unchanged     Problem: Fall Injury Risk  Goal: Absence of Fall and Fall-Related Injury  Outcome: Ongoing - Unchanged     Problem: Fall Injury Risk  Goal: Absence of Fall and Fall-Related Injury  Outcome: Ongoing - Unchanged

## 2023-06-30 DIAGNOSIS — Z944 Liver transplant status: Principal | ICD-10-CM

## 2023-06-30 DIAGNOSIS — E612 Magnesium deficiency: Principal | ICD-10-CM

## 2023-06-30 DIAGNOSIS — Z5181 Encounter for therapeutic drug level monitoring: Principal | ICD-10-CM

## 2023-07-02 NOTE — Unmapped (Signed)
PSH  S/p Right inguinal hernia as a child  S/p OLT (02/07/22, Desai @ [facility])  S/p robotic-assisted ventral hernia repair using intra-peritoneal underlay technique with 20 X 15 cm Ventralight ST barrier coated mesh and primary umbilical hernia repair (06/17/2023, Mazzola @ [facility])    Other tests/procedures  07/16/2022 non-targeted tranplant liver biopsy -->no rejection  02/03/2023 ERCP c post-transplant anastamosis dilation and biliary stent placement in CBD; Repeart ERCP in 6 monhts to remove stent. Repeat ERCP sooner if s/s of cholestasis    Imaging  CT scan from 03/17/2023 shows gallbladder surgically absent, upper midline fat-containing hernia spanning 2.5 cm wide by 2.6 cm long, atrophic bilateral rectus muscles, small 0.7 cm umbilical hernia, fat-containing left inguinal hernia    ==========================  11/22 post-op c SM    S/p robotic-assisted ventral hernia repair using intra-peritoneal underlay technique with 20 X 15 cm Ventralight ST barrier coated mesh and primary umbilical hernia repair (06/17/2023, Mazzola @ [facility])    Admitted 11/5-11/11/24.  Post-op course c/b ileus treated c NGT placed POD1-POD 4    Pain med scripts @ discharge: tylenol, robaxin, and five 5-mg oxycodone tablets

## 2023-07-04 ENCOUNTER — Ambulatory Visit: Admit: 2023-07-04 | Discharge: 2023-07-05 | Payer: MEDICARE

## 2023-07-04 DIAGNOSIS — Z9889 Other specified postprocedural states: Principal | ICD-10-CM

## 2023-07-04 DIAGNOSIS — Z8719 Personal history of other diseases of the digestive system: Principal | ICD-10-CM

## 2023-07-04 NOTE — Unmapped (Signed)
POST-OPERATIVE FOLLOW-UP APPOINTMENT    07/04/2023    Patient: Matthew Key  DOB: 12-28-1960  Primary Care Provider:  [name], MD    Subjective:  [name] underwent robotic-assisted ventral hernia repair using intra-peritoneal underlay technique with 20 X 15 cm Ventralight ST barrier coated mesh and primary umbilical hernia repair on 06/17/23. His postoperative course was complicated by an ileus requiring NG tube placement for decompression.  He was discharged on postop day 3 in good condition after tolerating regular diet.    Since discharge, he reports improved pain control and is no longer taking narcotic pain medication.  He is tolerating regular diet and is continuing to have bowel movements.  No issues with his incisions and feels they are healing well.  He has not felt any recurrence of his hernia.  He is wearing his abdominal binder intermittently and is activities of daily  living.    Vitals:  BP 117/77  - Pulse 70  - Temp 36.8 ??C (98.2 ??F) (Temporal)  - Ht 172.7 cm (5' 8)  - Wt 92.3 kg (203 lb 6.4 oz)  - BMI 30.93 kg/m??     Physical Exam:  General: Comfortable  Neuro: Alert and oriented  CV: Regular rate  Pulm: Nonlabored respirations on room air  Abdomen: soft, port incisions well-approximated with overlying Dermabond, no swelling or erythema, no palpable bulge along Mercedes incision      Assessment & Plan:    Matthew Key is a 62 y.o. old male  s/p robotic IPOM on 06/17/23.  He is recovering appropriately.  There are no signs of infection or hernia recurrence.    Return to clinic in one year with CT scan at that time or earlier if symptoms develop    I was the supervising physician for this care and available for assistance with all procedures and/or issues. I made the necessary modifications to this note and agree with the note as written.     --Rolena Infante MD  Department of Surgery, Gastrointestinal and Hernia Surgery

## 2023-07-07 DIAGNOSIS — Z5181 Encounter for therapeutic drug level monitoring: Principal | ICD-10-CM

## 2023-07-07 DIAGNOSIS — Z944 Liver transplant status: Principal | ICD-10-CM

## 2023-07-07 DIAGNOSIS — E612 Magnesium deficiency: Principal | ICD-10-CM

## 2023-07-14 DIAGNOSIS — Z944 Liver transplant status: Principal | ICD-10-CM

## 2023-07-14 DIAGNOSIS — E612 Magnesium deficiency: Principal | ICD-10-CM

## 2023-07-14 DIAGNOSIS — Z5181 Encounter for therapeutic drug level monitoring: Principal | ICD-10-CM

## 2023-07-17 DIAGNOSIS — I10 Essential (primary) hypertension: Principal | ICD-10-CM

## 2023-07-17 DIAGNOSIS — E119 Type 2 diabetes mellitus without complications: Principal | ICD-10-CM

## 2023-07-17 DIAGNOSIS — Z794 Long term (current) use of insulin: Principal | ICD-10-CM

## 2023-07-17 DIAGNOSIS — T451X5S Adverse effect of antineoplastic and immunosuppressive drugs, sequela: Principal | ICD-10-CM

## 2023-07-17 DIAGNOSIS — N189 Chronic kidney disease, unspecified: Principal | ICD-10-CM

## 2023-07-17 DIAGNOSIS — Z944 Liver transplant status: Principal | ICD-10-CM

## 2023-07-17 LAB — CBC W/ DIFFERENTIAL
BANDED NEUTROPHILS ABSOLUTE COUNT: 0 10*3/uL (ref 0.0–0.1)
BASOPHILS ABSOLUTE COUNT: 0 10*3/uL (ref 0.0–0.2)
BASOPHILS RELATIVE PERCENT: 0 %
EOSINOPHILS ABSOLUTE COUNT: 0.3 10*3/uL (ref 0.0–0.4)
EOSINOPHILS RELATIVE PERCENT: 4 %
HEMATOCRIT: 41.3 % (ref 37.5–51.0)
HEMOGLOBIN: 12.9 g/dL — ABNORMAL LOW (ref 13.0–17.7)
IMMATURE GRANULOCYTES: 0 %
LYMPHOCYTES ABSOLUTE COUNT: 2 10*3/uL (ref 0.7–3.1)
LYMPHOCYTES RELATIVE PERCENT: 23 %
MEAN CORPUSCULAR HEMOGLOBIN CONC: 31.2 g/dL — ABNORMAL LOW (ref 31.5–35.7)
MEAN CORPUSCULAR HEMOGLOBIN: 25.7 pg — ABNORMAL LOW (ref 26.6–33.0)
MEAN CORPUSCULAR VOLUME: 82 fL (ref 79–97)
MONOCYTES ABSOLUTE COUNT: 0.7 10*3/uL (ref 0.1–0.9)
MONOCYTES RELATIVE PERCENT: 8 %
NEUTROPHILS ABSOLUTE COUNT: 5.7 10*3/uL (ref 1.4–7.0)
NEUTROPHILS RELATIVE PERCENT: 65 %
PLATELET COUNT: 211 10*3/uL (ref 150–450)
RED BLOOD CELL COUNT: 5.02 x10E6/uL (ref 4.14–5.80)
RED CELL DISTRIBUTION WIDTH: 14.2 % (ref 11.6–15.4)
WHITE BLOOD CELL COUNT: 8.6 10*3/uL (ref 3.4–10.8)

## 2023-07-17 LAB — COMPREHENSIVE METABOLIC PANEL
ALBUMIN: 3.9 g/dL (ref 3.9–4.9)
ALKALINE PHOSPHATASE: 191 IU/L — ABNORMAL HIGH (ref 44–121)
ALT (SGPT): 15 IU/L (ref 0–44)
AST (SGOT): 12 IU/L (ref 0–40)
BILIRUBIN TOTAL (MG/DL) IN SER/PLAS: 0.5 mg/dL (ref 0.0–1.2)
BLOOD UREA NITROGEN: 15 mg/dL (ref 8–27)
BUN / CREAT RATIO: 8 — ABNORMAL LOW (ref 10–24)
CALCIUM: 9.4 mg/dL (ref 8.6–10.2)
CHLORIDE: 100 mmol/L (ref 96–106)
CO2: 24 mmol/L (ref 20–29)
CREATININE: 1.81 mg/dL — ABNORMAL HIGH (ref 0.76–1.27)
EGFR: 41 mL/min/{1.73_m2} — ABNORMAL LOW
GLOBULIN, TOTAL: 2.8 g/dL (ref 1.5–4.5)
GLUCOSE: 144 mg/dL — ABNORMAL HIGH (ref 70–99)
POTASSIUM: 4.7 mmol/L (ref 3.5–5.2)
SODIUM: 140 mmol/L (ref 134–144)
TOTAL PROTEIN: 6.7 g/dL (ref 6.0–8.5)

## 2023-07-17 LAB — BILIRUBIN, DIRECT: BILIRUBIN DIRECT: 0.16 mg/dL (ref 0.00–0.40)

## 2023-07-17 LAB — MAGNESIUM: MAGNESIUM: 1.6 mg/dL (ref 1.6–2.3)

## 2023-07-17 LAB — GAMMA GT: GAMMA GLUTAMYL TRANSFERASE: 44 IU/L (ref 0–65)

## 2023-07-17 LAB — PHOSPHORUS: PHOSPHORUS, SERUM: 3.8 mg/dL (ref 2.8–4.1)

## 2023-07-19 LAB — TACROLIMUS LEVEL: TACROLIMUS BLOOD: 10.2 ng/mL (ref 2.0–20.0)

## 2023-07-21 DIAGNOSIS — Z5181 Encounter for therapeutic drug level monitoring: Principal | ICD-10-CM

## 2023-07-21 DIAGNOSIS — E612 Magnesium deficiency: Principal | ICD-10-CM

## 2023-07-21 DIAGNOSIS — Z944 Liver transplant status: Principal | ICD-10-CM

## 2023-07-22 ENCOUNTER — Encounter
Admit: 2023-07-22 | Discharge: 2023-07-22 | Payer: MEDICARE | Attending: Student in an Organized Health Care Education/Training Program | Primary: Student in an Organized Health Care Education/Training Program

## 2023-07-22 ENCOUNTER — Ambulatory Visit: Admit: 2023-07-22 | Discharge: 2023-07-22 | Payer: MEDICARE

## 2023-07-22 MED ADMIN — fentaNYL (PF) (SUBLIMAZE) injection: INTRAVENOUS | @ 13:00:00 | Stop: 2023-07-22

## 2023-07-22 MED ADMIN — succinylcholine (ANECTINE) injection: INTRAVENOUS | @ 13:00:00 | Stop: 2023-07-22

## 2023-07-22 MED ADMIN — dexAMETHasone (DECADRON) 4 mg/mL injection: INTRAVENOUS | @ 13:00:00 | Stop: 2023-07-22

## 2023-07-22 MED ADMIN — Propofol (DIPRIVAN) injection: INTRAVENOUS | @ 13:00:00 | Stop: 2023-07-22

## 2023-07-22 MED ADMIN — levoFLOXacin (LEVAQUIN) 500 mg/100 mL IVPB: INTRAVENOUS | @ 13:00:00 | Stop: 2023-07-22

## 2023-07-22 MED ADMIN — ondansetron (ZOFRAN) injection: INTRAVENOUS | @ 13:00:00 | Stop: 2023-07-22

## 2023-07-22 MED ADMIN — lidocaine (PF) (XYLOCAINE-MPF) 20 mg/mL (2 %) injection: INTRAVENOUS | @ 13:00:00 | Stop: 2023-07-22

## 2023-07-24 NOTE — Unmapped (Signed)
Patient's 12/4 labs with bump in Cr to 1.81 after significant improvement to 1.15 with 11/10 results. Patient's Tac also resulted high, but was drawn near noon. Spoke to patient and reassured him that his lfts were stable. On recollection, he said he thought he took his Tac dose that morning b/c he was driving a friend to the lab and thought he would get his drawn as well. He said the Tac levels that were low in November were done while he was inpatient for his hernia repair, and they were giving him only 1mg  bid, instead of the 1.5mg  bid. He agreed to increase hydration and repeat labs in a few weeks to obtain a reliable trough. He denies establishing with a local nephrologist.Explained that a [facility] referral was made and provided him with phone number to make appt.

## 2023-07-28 DIAGNOSIS — Z944 Liver transplant status: Principal | ICD-10-CM

## 2023-07-28 DIAGNOSIS — E612 Magnesium deficiency: Principal | ICD-10-CM

## 2023-07-28 DIAGNOSIS — Z5181 Encounter for therapeutic drug level monitoring: Principal | ICD-10-CM

## 2023-08-04 DIAGNOSIS — E612 Magnesium deficiency: Principal | ICD-10-CM

## 2023-08-04 DIAGNOSIS — Z944 Liver transplant status: Principal | ICD-10-CM

## 2023-08-04 DIAGNOSIS — Z5181 Encounter for therapeutic drug level monitoring: Principal | ICD-10-CM

## 2023-08-11 DIAGNOSIS — E612 Magnesium deficiency: Principal | ICD-10-CM

## 2023-08-11 DIAGNOSIS — Z5181 Encounter for therapeutic drug level monitoring: Principal | ICD-10-CM

## 2023-08-11 DIAGNOSIS — Z944 Liver transplant status: Principal | ICD-10-CM

## 2023-08-12 DIAGNOSIS — K862 Cyst of pancreas: Principal | ICD-10-CM

## 2023-08-12 DIAGNOSIS — Z944 Liver transplant status: Principal | ICD-10-CM

## 2023-08-15 DIAGNOSIS — R52 Pain, unspecified: Principal | ICD-10-CM

## 2023-08-15 DIAGNOSIS — E139 Other specified diabetes mellitus without complications: Principal | ICD-10-CM

## 2023-08-15 MED ORDER — INSULIN ASPART (U-100) 100 UNIT/ML (3 ML) SUBCUTANEOUS PEN
0 refills | 0.00000 days
Start: 2023-08-15 — End: ?

## 2023-08-15 MED ORDER — DULOXETINE 30 MG CAPSULE,DELAYED RELEASE
ORAL_CAPSULE | Freq: Every day | ORAL | 3 refills | 90.00000 days
Start: 2023-08-15 — End: 2024-08-14

## 2023-08-15 MED ORDER — INSULIN DEGLUDEC (U-100) 100 UNIT/ML (3 ML) SUBCUTANEOUS PEN
Freq: Every day | SUBCUTANEOUS | 3 refills | 100.00000 days
Start: 2023-08-15 — End: 2024-08-14

## 2023-08-15 MED ORDER — AMLODIPINE 10 MG TABLET
ORAL_TABLET | Freq: Every day | ORAL | 0 refills | 30.00000 days
Start: 2023-08-15 — End: 2024-08-14

## 2023-08-15 NOTE — Unmapped (Signed)
On 08/12/23 lvm for pt re scheduling annual appt in June with MRCP included. Asked for call bacjk and left call-back number. 1st call    Today received return vm from pt sent while this tpa out of office. Called pt back and he is scheduled for 6/27 for clinic appt with Dr. Waynetta Sandy and MRCP. He wanted it as close to his tx date as possible. Reminded himj of 6 hr npo. He said he would get labs week before, denied need for appt letter, and verbalized understanding of all discussed.

## 2023-08-16 DIAGNOSIS — E139 Other specified diabetes mellitus without complications: Principal | ICD-10-CM

## 2023-08-16 DIAGNOSIS — R52 Pain, unspecified: Principal | ICD-10-CM

## 2023-08-16 NOTE — Unmapped (Signed)
[facility] Special[facility] and [facility] Delivery Pharm[facility] Refill Coordination Note    Specialty Medication(s) to be Shipped:   Transplant: tacrolimus 0.5mg  and Specialty Lite: Ozempic    Other medication(s) to be shipped:  amlodipine, atorvastatin , pen needles , carvedilol , duloxetine , novolog and tresiba      Matthew Key, DOB: Jul 27, 1961  Phone: [phone] (home) [phone] (work)      All above HIPAA information was verified with patient.     Was a Nurse, learning disability used for this call? No    Completed refill call assessment today to schedule patient's medication shipment from the [facility]  [phone]).  All relevant notes have been reviewed.     Specialty medication(s) and dose(s) confirmed: Regimen is correct and unchanged.   Changes to medications: Kevin reports no changes at this time.  Changes to insurance: No  New side effects reported not previously addressed with a pharmacist or physician: None reported  Questions for the pharmacist: No    Confirmed patient received a Conservation officer, historic buildings and a Surveyor, mining with first shipment. The patient will receive a drug information handout for each medication shipped and additional FDA Medication Guides as required.       DISEASE/MEDICATION-SPECIFIC INFORMATION        N/A    SPECIALTY MEDICATION ADHERENCE     Medication Adherence    Patient reported X missed doses in the last month: 0  Specialty Medication: semaglutide: OZEMPIC 0.25 mg or 0.5 mg (2 mg/3 mL) Pnij  Patient is on additional specialty medications: Yes  Additional Specialty Medications: tacrolimus 0.5 MG capsule (PROGRAF)  Patient Reported Additional Medication X Missed Doses in the Last Month: 0  Patient is on more than two specialty medications: No              Were doses missed due to medication being on hold? No    tacrolimus 0.5 mg: 7 days of medicine on hand     REFERRAL TO PHARMACIST     Referral to the pharmacist: Not needed      [facility]     Shipping address confirmed in Epic.       Delivery Scheduled: Yes, Expected medication delivery date: 08/19/2023.     Medication will be delivered via UPS to the prescription address in Epic WAM.    [name]   [facility] Delivery Pharm[facility]  Specialty Technician

## 2023-08-18 DIAGNOSIS — R52 Pain, unspecified: Principal | ICD-10-CM

## 2023-08-18 DIAGNOSIS — E612 Magnesium deficiency: Principal | ICD-10-CM

## 2023-08-18 DIAGNOSIS — E139 Other specified diabetes mellitus without complications: Principal | ICD-10-CM

## 2023-08-18 DIAGNOSIS — Z5181 Encounter for therapeutic drug level monitoring: Principal | ICD-10-CM

## 2023-08-18 DIAGNOSIS — Z944 Liver transplant status: Principal | ICD-10-CM

## 2023-08-18 MED ORDER — INSULIN ASPART (U-100) 100 UNIT/ML (3 ML) SUBCUTANEOUS PEN
0 refills | 0.00 days
Start: 2023-08-18 — End: ?

## 2023-08-18 MED ORDER — AMLODIPINE 10 MG TABLET
ORAL_TABLET | Freq: Every day | ORAL | 0 refills | 30.00 days
Start: 2023-08-18 — End: 2024-08-17

## 2023-08-18 MED ORDER — DULOXETINE 30 MG CAPSULE,DELAYED RELEASE
ORAL_CAPSULE | Freq: Every day | ORAL | 3 refills | 90.00 days
Start: 2023-08-18 — End: 2024-08-17

## 2023-08-18 MED ORDER — INSULIN DEGLUDEC (U-100) 100 UNIT/ML (3 ML) SUBCUTANEOUS PEN
Freq: Every day | SUBCUTANEOUS | 3 refills | 100.00 days
Start: 2023-08-18 — End: 2024-08-17

## 2023-08-18 NOTE — Unmapped (Signed)
Matthew Key 's entire shipment will be delayed as a result of a different copay.      I have reached out to the patient  at [phone]  and communicated the delay. We will wait for a call back from the patient to reschedule the delivery.  We have not confirmed the new delivery date.

## 2023-08-20 MED ORDER — DULOXETINE 30 MG CAPSULE,DELAYED RELEASE
ORAL_CAPSULE | Freq: Every day | ORAL | 1 refills | 90 days
Start: 2023-08-20 — End: 2024-08-19

## 2023-08-20 MED ORDER — AMLODIPINE 10 MG TABLET
ORAL_TABLET | Freq: Every day | ORAL | 0 refills | 90 days
Start: 2023-08-20 — End: 2024-08-19

## 2023-08-20 MED ORDER — INSULIN DEGLUDEC (U-100) 100 UNIT/ML (3 ML) SUBCUTANEOUS PEN
Freq: Every day | SUBCUTANEOUS | 3 refills | 100 days
Start: 2023-08-20 — End: 2024-08-19

## 2023-08-21 MED ORDER — INSULIN ASPART (U-100) 100 UNIT/ML (3 ML) SUBCUTANEOUS PEN
SUBCUTANEOUS | 0 refills | 0.00 days
Start: 2023-08-21 — End: ?

## 2023-08-21 MED ORDER — AMLODIPINE 10 MG TABLET
ORAL_TABLET | Freq: Every day | ORAL | 3 refills | 90 days | Status: CP
Start: 2023-08-21 — End: 2024-08-20
  Filled 2023-08-27: qty 90, 90d supply, fill #0

## 2023-08-22 NOTE — Unmapped (Signed)
Matthew Key 's entire shipment will be rescheduled as a result of a new prescription for the medication has been received.      I have spoken with the patient  at [phone]  and communicated the delivery change. We will reschedule the medication for the delivery date that the patient agreed upon.  We have confirmed the delivery date as 08/26/2023, via ups.

## 2023-08-25 DIAGNOSIS — Z944 Liver transplant status: Principal | ICD-10-CM

## 2023-08-25 DIAGNOSIS — E612 Magnesium deficiency: Principal | ICD-10-CM

## 2023-08-25 DIAGNOSIS — Z5181 Encounter for therapeutic drug level monitoring: Principal | ICD-10-CM

## 2023-08-27 DIAGNOSIS — E875 Hyperkalemia: Principal | ICD-10-CM

## 2023-08-27 LAB — COMPREHENSIVE METABOLIC PANEL
ALBUMIN: 4 g/dL (ref 3.9–4.9)
ALKALINE PHOSPHATASE: 247 IU/L — ABNORMAL HIGH (ref 44–121)
ALT (SGPT): 8 IU/L (ref 0–44)
AST (SGOT): 13 IU/L (ref 0–40)
BILIRUBIN TOTAL (MG/DL) IN SER/PLAS: 0.4 mg/dL (ref 0.0–1.2)
BLOOD UREA NITROGEN: 16 mg/dL (ref 8–27)
BUN / CREAT RATIO: 9 — ABNORMAL LOW (ref 10–24)
CALCIUM: 9.5 mg/dL (ref 8.6–10.2)
CHLORIDE: 106 mmol/L (ref 96–106)
CO2: 20 mmol/L (ref 20–29)
CREATININE: 1.7 mg/dL — ABNORMAL HIGH (ref 0.76–1.27)
EGFR: 45 mL/min/{1.73_m2} — ABNORMAL LOW
GLOBULIN, TOTAL: 2.8 g/dL (ref 1.5–4.5)
GLUCOSE: 178 mg/dL — ABNORMAL HIGH (ref 70–99)
POTASSIUM: 6 mmol/L — ABNORMAL HIGH (ref 3.5–5.2)
SODIUM: 142 mmol/L (ref 134–144)
TOTAL PROTEIN: 6.8 g/dL (ref 6.0–8.5)

## 2023-08-27 LAB — CBC W/ DIFFERENTIAL
BANDED NEUTROPHILS ABSOLUTE COUNT: 0 10*3/uL (ref 0.0–0.1)
BASOPHILS ABSOLUTE COUNT: 0 10*3/uL (ref 0.0–0.2)
BASOPHILS RELATIVE PERCENT: 0 %
EOSINOPHILS ABSOLUTE COUNT: 0.3 10*3/uL (ref 0.0–0.4)
EOSINOPHILS RELATIVE PERCENT: 3 %
HEMATOCRIT: 43.3 % (ref 37.5–51.0)
HEMOGLOBIN: 13.4 g/dL (ref 13.0–17.7)
IMMATURE GRANULOCYTES: 0 %
LYMPHOCYTES ABSOLUTE COUNT: 1.8 10*3/uL (ref 0.7–3.1)
LYMPHOCYTES RELATIVE PERCENT: 20 %
MEAN CORPUSCULAR HEMOGLOBIN CONC: 30.9 g/dL — ABNORMAL LOW (ref 31.5–35.7)
MEAN CORPUSCULAR HEMOGLOBIN: 25.7 pg — ABNORMAL LOW (ref 26.6–33.0)
MEAN CORPUSCULAR VOLUME: 83 fL (ref 79–97)
MONOCYTES ABSOLUTE COUNT: 0.7 10*3/uL (ref 0.1–0.9)
MONOCYTES RELATIVE PERCENT: 8 %
NEUTROPHILS ABSOLUTE COUNT: 6.1 10*3/uL (ref 1.4–7.0)
NEUTROPHILS RELATIVE PERCENT: 69 %
PLATELET COUNT: 245 10*3/uL (ref 150–450)
RED BLOOD CELL COUNT: 5.21 x10E6/uL (ref 4.14–5.80)
RED CELL DISTRIBUTION WIDTH: 15.1 % (ref 11.6–15.4)
WHITE BLOOD CELL COUNT: 9 10*3/uL (ref 3.4–10.8)

## 2023-08-27 LAB — BILIRUBIN, DIRECT: BILIRUBIN DIRECT: 0.15 mg/dL (ref 0.00–0.40)

## 2023-08-27 LAB — PHOSPHORUS: PHOSPHORUS, SERUM: 4.1 mg/dL (ref 2.8–4.1)

## 2023-08-27 LAB — GAMMA GT: GAMMA GLUTAMYL TRANSFERASE: 50 IU/L (ref 0–65)

## 2023-08-27 LAB — MAGNESIUM: MAGNESIUM: 1.7 mg/dL (ref 1.6–2.3)

## 2023-08-27 MED FILL — TACROLIMUS 0.5 MG CAPSULE, IMMEDIATE-RELEASE: ORAL | 30 days supply | Qty: 180 | Fill #4

## 2023-08-27 MED FILL — OZEMPIC 0.25 MG OR 0.5 MG (2 MG/3 ML) SUBCUTANEOUS PEN INJECTOR: SUBCUTANEOUS | 28 days supply | Qty: 3 | Fill #7

## 2023-08-27 MED FILL — ATORVASTATIN 40 MG TABLET: ORAL | 90 days supply | Qty: 90 | Fill #2

## 2023-08-27 MED FILL — TRESIBA FLEXTOUCH U-100 INSULIN 100 UNIT/ML (3 ML) SUBCUTANEOUS PEN: SUBCUTANEOUS | 100 days supply | Qty: 15 | Fill #0

## 2023-08-27 MED FILL — CARVEDILOL 12.5 MG TABLET: ORAL | 90 days supply | Qty: 180 | Fill #2

## 2023-08-27 MED FILL — BD ULTRA-FINE NANO PEN NEEDLE 32 GAUGE X 5/32" (4 MM): SUBCUTANEOUS | 25 days supply | Qty: 100 | Fill #3

## 2023-08-27 MED FILL — NOVOLOG FLEXPEN U-100 INSULIN ASPART 100 UNIT/ML (3 ML) SUBCUTANEOUS: SUBCUTANEOUS | 41 days supply | Qty: 15 | Fill #0

## 2023-08-27 MED FILL — DULOXETINE 30 MG CAPSULE,DELAYED RELEASE: ORAL | 90 days supply | Qty: 90 | Fill #0

## 2023-08-27 NOTE — Unmapped (Signed)
Patient's lab results from yesterday in epic today with K-6.0, Cr-1.70. Tac pending. Left VM for patient to call TNC asap, but to report to ED if experiencing any chest pain, palpitations, sob or dizziness. Patient has used [employer] fast streamer in the past. Reached out to [facility] for recommendations. Dr.Moon and PharmD Chargualaf recommended he take 8.4 g Veltassa daily for the next 3 days, increase hydration and repeat labs on Monday.     Reached patient on 2nd call. He denies having any chest pain/palpitations or other sx of hyperkalemia. He reports he has Veltassa on hand and verbalized understanding to take the 8.4g dose daily x 3 days, increase hydration and repeat labs on Monday. Discussed dietary modifications to reduce his K as well. Tac is pending at this point, but let him know that an adjustment in his Bp meds may be made based on those results. He confirmed he has been taking the correct dose of Tac and that the result will be reliable. His latest BP was 150/74 when he got labs yesterday. Encouraged him to start taking it at am/pm and to report this to this TNC.      He shared that he has had diarrhea since taking golytely, which was prescribed by Dr.Moon as a oto dose back in September. Pt.explained that he had delayed taking the dose to complete his hernia surgery, then again for another issue, so he finally took it around [place]. He reports he has had watery diarrhea ~every 2 hrs since and is being seen by his local GI provider for it. He he was written a prescription for the diarrhea, but needs to take another stool sample for testing to r/o infection beforehand. Encouraged him to contact this TNC with results. Updated Dr.Moon with this information as well.

## 2023-08-27 NOTE — Unmapped (Signed)
Matthew Key 's entire shipment will be rescheduled as a result of copay is now approved by patient/caregiver.      I have spoken with the patient  at [phone]  and communicated the delivery change. We will reschedule the medication for the delivery date that the patient agreed upon.  We have confirmed the delivery date as 08/28/2023, via ups.

## 2023-08-28 LAB — TACROLIMUS LEVEL: TACROLIMUS BLOOD: 6.1 ng/mL (ref 2.0–20.0)

## 2023-08-29 DIAGNOSIS — Z796 Long-term use of immunosuppressant medication: Principal | ICD-10-CM

## 2023-08-29 DIAGNOSIS — E875 Hyperkalemia: Principal | ICD-10-CM

## 2023-08-29 DIAGNOSIS — Z944 Liver transplant status: Principal | ICD-10-CM

## 2023-08-29 DIAGNOSIS — I10 Essential (primary) hypertension: Principal | ICD-10-CM

## 2023-08-29 MED ORDER — CHLORTHALIDONE 25 MG TABLET
ORAL_TABLET | Freq: Every morning | ORAL | 11 refills | 30.00 days | Status: CP
Start: 2023-08-29 — End: 2024-08-28

## 2023-08-29 MED ORDER — TACROLIMUS 0.5 MG CAPSULE, IMMEDIATE-RELEASE
ORAL_CAPSULE | 11 refills | 0.00 days | Status: CP
Start: 2023-08-29 — End: ?

## 2023-08-29 NOTE — Unmapped (Signed)
Patient's 1/14 Tac result just above 4-6 goal at 6.1 and with hyperkalemia and elevated Cr. For this, Dr.Moon and PharmD Chargualaf recommended reducing his Tac to 1.5mg /1mg , stopping amlodipine and starting chlorthalidone 25mg  daily.    Spoke with patient, who confirmed he has been taking the Veltassa. Relayed recommendations as noted above and requested he repeat labs weekly for the next three weeks. He verbalized understanding.

## 2023-09-01 DIAGNOSIS — E612 Magnesium deficiency: Principal | ICD-10-CM

## 2023-09-01 DIAGNOSIS — Z944 Liver transplant status: Principal | ICD-10-CM

## 2023-09-01 DIAGNOSIS — Z5181 Encounter for therapeutic drug level monitoring: Principal | ICD-10-CM

## 2023-09-03 DIAGNOSIS — M791 Myalgia, unspecified site: Principal | ICD-10-CM

## 2023-09-03 DIAGNOSIS — E875 Hyperkalemia: Principal | ICD-10-CM

## 2023-09-03 DIAGNOSIS — Z944 Liver transplant status: Principal | ICD-10-CM

## 2023-09-03 DIAGNOSIS — I10 Essential (primary) hypertension: Principal | ICD-10-CM

## 2023-09-03 LAB — COMPREHENSIVE METABOLIC PANEL
ALBUMIN: 4 g/dL (ref 3.9–4.9)
ALKALINE PHOSPHATASE: 271 IU/L — ABNORMAL HIGH (ref 44–121)
ALT (SGPT): 10 IU/L (ref 0–44)
AST (SGOT): 11 IU/L (ref 0–40)
BILIRUBIN TOTAL (MG/DL) IN SER/PLAS: 0.4 mg/dL (ref 0.0–1.2)
BLOOD UREA NITROGEN: 17 mg/dL (ref 8–27)
BUN / CREAT RATIO: 11 (ref 10–24)
CALCIUM: 9.2 mg/dL (ref 8.6–10.2)
CHLORIDE: 100 mmol/L (ref 96–106)
CO2: 23 mmol/L (ref 20–29)
CREATININE: 1.59 mg/dL — ABNORMAL HIGH (ref 0.76–1.27)
EGFR: 48 mL/min/{1.73_m2} — ABNORMAL LOW
GLOBULIN, TOTAL: 3 g/dL (ref 1.5–4.5)
GLUCOSE: 148 mg/dL — ABNORMAL HIGH (ref 70–99)
POTASSIUM: 4.6 mmol/L (ref 3.5–5.2)
SODIUM: 141 mmol/L (ref 134–144)
TOTAL PROTEIN: 7 g/dL (ref 6.0–8.5)

## 2023-09-03 LAB — CBC W/ DIFFERENTIAL
BANDED NEUTROPHILS ABSOLUTE COUNT: 0 10*3/uL (ref 0.0–0.1)
BASOPHILS ABSOLUTE COUNT: 0 10*3/uL (ref 0.0–0.2)
BASOPHILS RELATIVE PERCENT: 0 %
EOSINOPHILS ABSOLUTE COUNT: 0.3 10*3/uL (ref 0.0–0.4)
EOSINOPHILS RELATIVE PERCENT: 4 %
HEMATOCRIT: 43.5 % (ref 37.5–51.0)
HEMOGLOBIN: 13.7 g/dL (ref 13.0–17.7)
IMMATURE GRANULOCYTES: 1 %
LYMPHOCYTES ABSOLUTE COUNT: 1.7 10*3/uL (ref 0.7–3.1)
LYMPHOCYTES RELATIVE PERCENT: 22 %
MEAN CORPUSCULAR HEMOGLOBIN CONC: 31.5 g/dL (ref 31.5–35.7)
MEAN CORPUSCULAR HEMOGLOBIN: 26.3 pg — ABNORMAL LOW (ref 26.6–33.0)
MEAN CORPUSCULAR VOLUME: 84 fL (ref 79–97)
MONOCYTES ABSOLUTE COUNT: 0.7 10*3/uL (ref 0.1–0.9)
MONOCYTES RELATIVE PERCENT: 9 %
NEUTROPHILS ABSOLUTE COUNT: 5 10*3/uL (ref 1.4–7.0)
NEUTROPHILS RELATIVE PERCENT: 64 %
PLATELET COUNT: 184 10*3/uL (ref 150–450)
RED BLOOD CELL COUNT: 5.2 x10E6/uL (ref 4.14–5.80)
RED CELL DISTRIBUTION WIDTH: 15.4 % (ref 11.6–15.4)
WHITE BLOOD CELL COUNT: 7.8 10*3/uL (ref 3.4–10.8)

## 2023-09-03 LAB — PHOSPHORUS: PHOSPHORUS, SERUM: 4.2 mg/dL — ABNORMAL HIGH (ref 2.8–4.1)

## 2023-09-03 LAB — GAMMA GT: GAMMA GLUTAMYL TRANSFERASE: 39 IU/L (ref 0–65)

## 2023-09-03 LAB — BILIRUBIN, DIRECT: BILIRUBIN DIRECT: 0.17 mg/dL (ref 0.00–0.40)

## 2023-09-03 LAB — MAGNESIUM: MAGNESIUM: 1.6 mg/dL (ref 1.6–2.3)

## 2023-09-03 MED ORDER — CHLORTHALIDONE 25 MG TABLET
ORAL_TABLET | Freq: Every morning | ORAL | 11 refills | 30.00 days
Start: 2023-09-03 — End: 2024-09-02

## 2023-09-03 MED ORDER — METHOCARBAMOL 500 MG TABLET
ORAL_TABLET | Freq: Three times a day (TID) | ORAL | 0 refills | 30.00 days | PRN
Start: 2023-09-03 — End: ?

## 2023-09-04 DIAGNOSIS — E875 Hyperkalemia: Principal | ICD-10-CM

## 2023-09-04 DIAGNOSIS — I10 Essential (primary) hypertension: Principal | ICD-10-CM

## 2023-09-04 LAB — TACROLIMUS LEVEL: TACROLIMUS BLOOD: 4.9 ng/mL (ref 2.0–20.0)

## 2023-09-04 MED ORDER — CHLORTHALIDONE 25 MG TABLET
ORAL_TABLET | Freq: Every morning | ORAL | 11 refills | 30.00 days | Status: CP
Start: 2023-09-04 — End: 2024-09-03

## 2023-09-04 MED ORDER — METHOCARBAMOL 500 MG TABLET
ORAL_TABLET | Freq: Three times a day (TID) | ORAL | 0 refills | 30.00 days | PRN
Start: 2023-09-04 — End: ?

## 2023-09-04 NOTE — Unmapped (Signed)
Pt request for RX Refill

## 2023-09-08 DIAGNOSIS — E612 Magnesium deficiency: Principal | ICD-10-CM

## 2023-09-08 DIAGNOSIS — Z5181 Encounter for therapeutic drug level monitoring: Principal | ICD-10-CM

## 2023-09-08 DIAGNOSIS — Z944 Liver transplant status: Principal | ICD-10-CM

## 2023-09-09 LAB — MAGNESIUM: MAGNESIUM: 1.9 mg/dL (ref 1.6–2.3)

## 2023-09-09 LAB — CBC W/ DIFFERENTIAL
BANDED NEUTROPHILS ABSOLUTE COUNT: 0 10*3/uL (ref 0.0–0.1)
BASOPHILS ABSOLUTE COUNT: 0 10*3/uL (ref 0.0–0.2)
BASOPHILS RELATIVE PERCENT: 0 %
EOSINOPHILS ABSOLUTE COUNT: 0.4 10*3/uL (ref 0.0–0.4)
EOSINOPHILS RELATIVE PERCENT: 5 %
HEMATOCRIT: 43.7 % (ref 37.5–51.0)
HEMOGLOBIN: 13.8 g/dL (ref 13.0–17.7)
IMMATURE GRANULOCYTES: 1 %
LYMPHOCYTES ABSOLUTE COUNT: 1.9 10*3/uL (ref 0.7–3.1)
LYMPHOCYTES RELATIVE PERCENT: 24 %
MEAN CORPUSCULAR HEMOGLOBIN CONC: 31.6 g/dL (ref 31.5–35.7)
MEAN CORPUSCULAR HEMOGLOBIN: 26.2 pg — ABNORMAL LOW (ref 26.6–33.0)
MEAN CORPUSCULAR VOLUME: 83 fL (ref 79–97)
MONOCYTES ABSOLUTE COUNT: 0.7 10*3/uL (ref 0.1–0.9)
MONOCYTES RELATIVE PERCENT: 9 %
NEUTROPHILS ABSOLUTE COUNT: 4.9 10*3/uL (ref 1.4–7.0)
NEUTROPHILS RELATIVE PERCENT: 61 %
PLATELET COUNT: 208 10*3/uL (ref 150–450)
RED BLOOD CELL COUNT: 5.27 x10E6/uL (ref 4.14–5.80)
RED CELL DISTRIBUTION WIDTH: 14.9 % (ref 11.6–15.4)
WHITE BLOOD CELL COUNT: 8 10*3/uL (ref 3.4–10.8)

## 2023-09-09 LAB — COMPREHENSIVE METABOLIC PANEL
ALBUMIN: 4.1 g/dL (ref 3.9–4.9)
ALKALINE PHOSPHATASE: 252 IU/L — ABNORMAL HIGH (ref 44–121)
ALT (SGPT): 8 IU/L (ref 0–44)
AST (SGOT): 10 IU/L (ref 0–40)
BILIRUBIN TOTAL (MG/DL) IN SER/PLAS: 0.4 mg/dL (ref 0.0–1.2)
BLOOD UREA NITROGEN: 20 mg/dL (ref 8–27)
BUN / CREAT RATIO: 11 (ref 10–24)
CALCIUM: 9.2 mg/dL (ref 8.6–10.2)
CHLORIDE: 99 mmol/L (ref 96–106)
CO2: 22 mmol/L (ref 20–29)
CREATININE: 1.81 mg/dL — ABNORMAL HIGH (ref 0.76–1.27)
EGFR: 41 mL/min/{1.73_m2} — ABNORMAL LOW
GLOBULIN, TOTAL: 2.7 g/dL (ref 1.5–4.5)
GLUCOSE: 160 mg/dL — ABNORMAL HIGH (ref 70–99)
POTASSIUM: 5.4 mmol/L — ABNORMAL HIGH (ref 3.5–5.2)
SODIUM: 138 mmol/L (ref 134–144)
TOTAL PROTEIN: 6.8 g/dL (ref 6.0–8.5)

## 2023-09-09 LAB — GAMMA GT: GAMMA GLUTAMYL TRANSFERASE: 37 IU/L (ref 0–65)

## 2023-09-09 LAB — BILIRUBIN, DIRECT: BILIRUBIN DIRECT: 0.13 mg/dL (ref 0.00–0.40)

## 2023-09-09 LAB — PHOSPHORUS: PHOSPHORUS, SERUM: 3.7 mg/dL (ref 2.8–4.1)

## 2023-09-09 NOTE — Unmapped (Addendum)
Patient's 1/27 labs with elevated K-5.4 and Cr-1.81. Dr.Moon reached out to see if patient's blood pressure meds were changed as directed. Spoke to the patient, who reports despite instruction on 1/17 to stop amlodipine, he has still been taking it, but he did start the chlorthalidone and reduced his Tac to 1.5mg /1mg  as instructed that day. His BPs have been as follows:   140/77  135/76  133/76  140/78   150/74  133/74   He said he is continuing to have the intermittent diarrhea and is working with his local GI doctor on this, taking in a stool sample for testing today. He has a Nephrology appt on 2/26 with Dr. Arvin Collard.    Communicated patient's BPs and continued use of amlodipine with coreg and chlorthalidone with Dr.Moon and PharmD Chargualaf, who agreed that it would be fine for him to continue the amlodipine for now. Spoke to patient and relayed recommendation and explained that his nephrologist will be assisting with BP mgmt going forward. He reported that all his stool testing was negative, but he has not heard back yet from his GI provider on a plan of care for his ongoing diarrhea. He is aware he can take an imodium daily. Encouraged him with hydration as well and to update this TNC once a tx plan is in place. He verbalized understanding.

## 2023-09-10 LAB — TACROLIMUS LEVEL: TACROLIMUS BLOOD: 5.2 ng/mL (ref 2.0–20.0)

## 2023-09-15 DIAGNOSIS — Z5181 Encounter for therapeutic drug level monitoring: Principal | ICD-10-CM

## 2023-09-15 DIAGNOSIS — Z944 Liver transplant status: Principal | ICD-10-CM

## 2023-09-15 DIAGNOSIS — E612 Magnesium deficiency: Principal | ICD-10-CM

## 2023-09-22 DIAGNOSIS — E612 Magnesium deficiency: Principal | ICD-10-CM

## 2023-09-22 DIAGNOSIS — Z5181 Encounter for therapeutic drug level monitoring: Principal | ICD-10-CM

## 2023-09-22 DIAGNOSIS — Z944 Liver transplant status: Principal | ICD-10-CM

## 2023-09-29 DIAGNOSIS — Z5181 Encounter for therapeutic drug level monitoring: Principal | ICD-10-CM

## 2023-09-29 DIAGNOSIS — Z944 Liver transplant status: Principal | ICD-10-CM

## 2023-09-29 DIAGNOSIS — E612 Magnesium deficiency: Principal | ICD-10-CM

## 2023-10-06 DIAGNOSIS — Z944 Liver transplant status: Principal | ICD-10-CM

## 2023-10-06 DIAGNOSIS — Z5181 Encounter for therapeutic drug level monitoring: Principal | ICD-10-CM

## 2023-10-06 DIAGNOSIS — E612 Magnesium deficiency: Principal | ICD-10-CM

## 2023-10-13 DIAGNOSIS — E612 Magnesium deficiency: Principal | ICD-10-CM

## 2023-10-13 DIAGNOSIS — Z5181 Encounter for therapeutic drug level monitoring: Principal | ICD-10-CM

## 2023-10-13 DIAGNOSIS — Z944 Liver transplant status: Principal | ICD-10-CM

## 2023-10-20 DIAGNOSIS — Z944 Liver transplant status: Principal | ICD-10-CM

## 2023-10-20 DIAGNOSIS — Z5181 Encounter for therapeutic drug level monitoring: Principal | ICD-10-CM

## 2023-10-20 DIAGNOSIS — E612 Magnesium deficiency: Principal | ICD-10-CM

## 2023-10-23 ENCOUNTER — Emergency Department (HOSPITAL_COMMUNITY)

## 2023-10-23 ENCOUNTER — Other Ambulatory Visit: Payer: Self-pay

## 2023-10-23 ENCOUNTER — Emergency Department (HOSPITAL_COMMUNITY)
Admission: EM | Admit: 2023-10-23 | Discharge: 2023-10-23 | Disposition: A | Discharge status: Home / Self Care | Attending: Emergency Medicine | Admitting: Emergency Medicine

## 2023-10-23 ENCOUNTER — Encounter (HOSPITAL_COMMUNITY): Payer: Self-pay

## 2023-10-23 DIAGNOSIS — Z7951 Long term (current) use of inhaled steroids: Secondary | ICD-10-CM | POA: Insufficient documentation

## 2023-10-23 DIAGNOSIS — M1990 Unspecified osteoarthritis, unspecified site: Secondary | ICD-10-CM | POA: Insufficient documentation

## 2023-10-23 DIAGNOSIS — Z8739 Personal history of other diseases of the musculoskeletal system and connective tissue: Secondary | ICD-10-CM | POA: Insufficient documentation

## 2023-10-23 DIAGNOSIS — I509 Heart failure, unspecified: Secondary | ICD-10-CM | POA: Diagnosis not present

## 2023-10-23 DIAGNOSIS — E1139 Type 2 diabetes mellitus with other diabetic ophthalmic complication: Secondary | ICD-10-CM | POA: Insufficient documentation

## 2023-10-23 DIAGNOSIS — Z7901 Long term (current) use of anticoagulants: Secondary | ICD-10-CM | POA: Insufficient documentation

## 2023-10-23 DIAGNOSIS — E119 Type 2 diabetes mellitus without complications: Secondary | ICD-10-CM | POA: Diagnosis not present

## 2023-10-23 DIAGNOSIS — R972 Elevated prostate specific antigen [PSA]: Secondary | ICD-10-CM | POA: Insufficient documentation

## 2023-10-23 DIAGNOSIS — I482 Chronic atrial fibrillation, unspecified: Secondary | ICD-10-CM | POA: Insufficient documentation

## 2023-10-23 DIAGNOSIS — M792 Neuralgia and neuritis, unspecified: Secondary | ICD-10-CM | POA: Insufficient documentation

## 2023-10-23 DIAGNOSIS — Z79899 Other long term (current) drug therapy: Secondary | ICD-10-CM | POA: Diagnosis not present

## 2023-10-23 DIAGNOSIS — Z8679 Personal history of other diseases of the circulatory system: Secondary | ICD-10-CM | POA: Insufficient documentation

## 2023-10-23 DIAGNOSIS — J441 Chronic obstructive pulmonary disease with (acute) exacerbation: Secondary | ICD-10-CM | POA: Insufficient documentation

## 2023-10-23 DIAGNOSIS — E669 Obesity, unspecified: Secondary | ICD-10-CM | POA: Insufficient documentation

## 2023-10-23 DIAGNOSIS — R0602 Shortness of breath: Secondary | ICD-10-CM | POA: Diagnosis present

## 2023-10-23 DIAGNOSIS — N529 Male erectile dysfunction, unspecified: Secondary | ICD-10-CM

## 2023-10-23 DIAGNOSIS — I110 Hypertensive heart disease with heart failure: Secondary | ICD-10-CM | POA: Diagnosis not present

## 2023-10-23 DIAGNOSIS — H9190 Unspecified hearing loss, unspecified ear: Secondary | ICD-10-CM | POA: Insufficient documentation

## 2023-10-23 HISTORY — DX: Elevated prostate specific antigen (PSA): R97.20

## 2023-10-23 HISTORY — DX: Male erectile dysfunction, unspecified: N52.9

## 2023-10-23 HISTORY — DX: Personal history of other diseases of the musculoskeletal system and connective tissue: Z87.39

## 2023-10-23 HISTORY — DX: Unspecified osteoarthritis, unspecified site: M19.90

## 2023-10-23 HISTORY — DX: Unspecified hearing loss, unspecified ear: H91.90

## 2023-10-23 HISTORY — DX: Obesity, unspecified: E66.9

## 2023-10-23 LAB — CBC WITH DIFFERENTIAL/PLATELET
Abs Immature Granulocytes: 0.01 10*3/uL (ref 0.00–0.07)
Basophils Absolute: 0 10*3/uL (ref 0.0–0.1)
Basophils Relative: 1 %
Eosinophils Absolute: 0.3 10*3/uL (ref 0.0–0.5)
Eosinophils Relative: 5 %
HCT: 39.1 % (ref 39.0–52.0)
Hemoglobin: 13.1 g/dL (ref 13.0–17.0)
Immature Granulocytes: 0 %
Lymphocytes Relative: 43 %
Lymphs Abs: 2.9 10*3/uL (ref 0.7–4.0)
MCH: 31.1 pg (ref 26.0–34.0)
MCHC: 33.5 g/dL (ref 30.0–36.0)
MCV: 92.9 fL (ref 80.0–100.0)
Monocytes Absolute: 0.6 10*3/uL (ref 0.1–1.0)
Monocytes Relative: 9 %
Neutro Abs: 2.7 10*3/uL (ref 1.7–7.7)
Neutrophils Relative %: 42 %
Platelets: 262 10*3/uL (ref 150–400)
RBC: 4.21 MIL/uL — ABNORMAL LOW (ref 4.22–5.81)
RDW: 15 % (ref 11.5–15.5)
WBC: 6.5 10*3/uL (ref 4.0–10.5)
nRBC: 0 % (ref 0.0–0.2)

## 2023-10-23 LAB — BASIC METABOLIC PANEL
Anion gap: 9 (ref 5–15)
BUN: 14 mg/dL (ref 8–23)
CO2: 28 mmol/L (ref 22–32)
Calcium: 9.1 mg/dL (ref 8.9–10.3)
Chloride: 101 mmol/L (ref 98–111)
Creatinine, Ser: 1.33 mg/dL — ABNORMAL HIGH (ref 0.61–1.24)
GFR, Estimated: >60 mL/min (ref >60)
Glucose, Bld: 128 mg/dL — ABNORMAL HIGH (ref 70–99)
Potassium: 3.5 mmol/L (ref 3.5–5.1)
Sodium: 138 mmol/L (ref 135–145)

## 2023-10-23 LAB — BRAIN NATRIURETIC PEPTIDE: B Natriuretic Peptide: 50 pg/mL (ref 0.0–100.0)

## 2023-10-23 LAB — RESP PANEL BY RT-PCR (RSV, FLU A&B, COVID)  RVPGX2
Influenza A by PCR: NEGATIVE
Influenza B by PCR: NEGATIVE
Resp Syncytial Virus by PCR: NEGATIVE
SARS Coronavirus 2 by RT PCR: NEGATIVE

## 2023-10-23 MED ORDER — DEXAMETHASONE SODIUM PHOSPHATE 10 MG/ML IJ SOLN
10.0000 mg | Freq: Once | INTRAMUSCULAR | Status: AC
  Administered 2023-10-23: 10 mg via INTRAVENOUS
  Filled 2023-10-23: qty 1

## 2023-10-23 MED ORDER — IPRATROPIUM-ALBUTEROL 0.5-2.5 (3) MG/3ML IN SOLN
3.0000 mL | Freq: Once | RESPIRATORY_TRACT | Status: AC
  Administered 2023-10-23: 3 mL via RESPIRATORY_TRACT
  Filled 2023-10-23: qty 3

## 2023-10-23 NOTE — ED Notes (Signed)
 ED Provider at bedside.

## 2023-10-23 NOTE — ED Notes (Signed)
 Pt walked between room and nurses station. O2 sat steady at 97-98%. Pt tolerated it well with no complaints.

## 2023-10-23 NOTE — ED Triage Notes (Signed)
 Pov from home with wife. Cc of sob x 1 week that has gotten worse. Was started on nebs but they made him feel worse, lasted used it tuesday.

## 2023-10-23 NOTE — Discharge Instructions (Signed)
 You were seen today for shortness of breath.  This is likely related to COPD.  Continue your inhaler or nebulizer at home.  You were given a dose of steroids.  Follow-up with your neurologist.

## 2023-10-23 NOTE — ED Provider Notes (Signed)
 [place] EMERGENCY DEPARTMENT AT [facility] Provider Note   CSN: [id number] Arrival date & time: 10/23/23  0159     History  Chief Complaint  Patient presents with   Shortness of Breath    William Kemp is a 63 y.o. male.  HPI     This is a 63 year old with history of diabetes, hypertension, CHF, sleep apnea, COPD who presents with shortness of breath.  Patient states over the last week he has had worsening shortness of breath.  He states that he was started on nebs sometime last week but feels like this has made his shortness of breath worse.  He believes it is albuterol.  He has had a cough but no fevers.  Denies lower extremity swelling.  No chest pain.  Home Medications Prior to Admission medications   Medication Sig Start Date End Date Taking? Authorizing Provider  albuterol (VENTOLIN HFA) 108 (90 Base) MCG/ACT inhaler Inhale into the lungs every 6 (six) hours as needed for wheezing or shortness of breath.    [provider]  allopurinol (ZYLOPRIM) 300 MG tablet Take 300 mg by mouth daily. 06/04/20   [provider]  baclofen (LIORESAL) 10 MG tablet Take 10 mg by mouth 3 (three) times daily. 03/20/20   [provider]  BREO ELLIPTA 100-25 MCG/INH AEPB Inhale 1 puff into the lungs daily. 04/14/20   [provider]  carvedilol (COREG) 25 MG tablet  02/17/20   [provider]  chlorthalidone (HYGROTON) 25 MG tablet Take 25 mg by mouth daily. 05/15/20   [provider]  ELIQUIS 5 MG TABS tablet Take 5 mg by mouth 2 (two) times daily. 03/28/20   [provider]  etodolac (LODINE) 400 MG tablet Take 400 mg by mouth 2 (two) times daily.    [provider]  gabapentin (NEURONTIN) 600 MG tablet Take 600 mg by mouth 3 (three) times daily. 05/25/20   [provider]  hydrALAZINE (APRESOLINE) 100 MG tablet Take 100 mg by mouth 2 (two) times daily. 06/11/20   [provider]   HYDROcodone-acetaminophen (NORCO/VICODIN) 5-325 MG tablet Take 1 tablet by mouth every 4 (four) hours as needed. 05/31/20   Elson Areas, PA-C  LINZESS 145 MCG CAPS capsule Take 145 mcg by mouth daily. 05/15/20   [provider]  losartan (COZAAR) 100 MG tablet Take by mouth. 01/02/18   [provider]  OZEMPIC, 0.25 OR 0.5 MG/DOSE, 2 MG/1.5ML SOPN Inject into the skin. 04/14/20   [provider]  potassium chloride (KLOR-CON) 8 MEQ tablet Take 8 mEq by mouth daily. 05/22/20   [provider]  predniSONE (STERAPRED UNI-PAK 21 TAB) 10 MG (21) TBPK tablet 10mg  Tabs, 6 day taper. Use as directed 12/04/20   Pollyann Savoy, MD  tadalafil (CIALIS) 20 MG tablet Take by mouth. 05/18/20   [provider]  tamsulosin (FLOMAX) 0.4 MG CAPS capsule Take 0.4 mg by mouth daily. 05/30/20   [provider]      Allergies    Codeine and Tramadol    Review of Systems   Review of Systems  Constitutional:  Negative for fever.  Respiratory:  Positive for cough, shortness of breath and wheezing.   Cardiovascular:  Negative for chest pain.  All other systems reviewed and are negative.   Physical Exam Updated Vital Signs BP (!) 150/66   Pulse 62   Temp 98.2 F (36.8 C)   Resp 11   Ht 1.829 m (6')  Wt (!) 153.3 kg   SpO2 96%   BMI 45.84 kg/m  Physical Exam Vitals and nursing note reviewed.  Constitutional:      Appearance: He is well-developed. He is obese. He is not ill-appearing.  HENT:     Head: Normocephalic and atraumatic.  Eyes:     Pupils: Pupils are equal, round, and reactive to light.  Cardiovascular:     Rate and Rhythm: Normal rate and regular rhythm.     Heart sounds: Normal heart sounds. No murmur heard. Pulmonary:     Effort: Pulmonary effort is normal. No respiratory distress.     Breath sounds: Wheezing present.     Comments: No respiratory distress, fair air movement, scant wheezing in all lung fields Abdominal:      General: Bowel sounds are normal.     Palpations: Abdomen is soft.     Tenderness: There is no abdominal tenderness. There is no rebound.  Musculoskeletal:     Cervical back: Neck supple.     Comments: Trace bilateral lower extremity edema  Lymphadenopathy:     Cervical: No cervical adenopathy.  Skin:    General: Skin is warm and dry.  Neurological:     Mental Status: He is alert and oriented to person, place, and time.  Psychiatric:        Mood and Affect: Mood normal.     ED Results / Procedures / Treatments   Labs (all labs ordered are listed, but only abnormal results are displayed) Labs Reviewed  CBC WITH DIFFERENTIAL/PLATELET - Abnormal; Notable for the following components:      Result Value   RBC 4.21 (*)    All other components within normal limits  BASIC METABOLIC PANEL - Abnormal; Notable for the following components:   Glucose, Bld 128 (*)    Creatinine, Ser 1.33 (*)    All other components within normal limits  RESP PANEL BY RT-PCR (RSV, FLU A&B, COVID)  [id number]  BRAIN NATRIURETIC PEPTIDE    EKG EKG Interpretation Date/Time:  Thursday October 23 2023 02:10:45 EDT Ventricular Rate:  67 PR Interval:  181 QRS Duration:  105 QT Interval:  417 QTC Calculation: 441 R Axis:   -22  Text Interpretation: Sinus rhythm Borderline left axis deviation Low voltage, precordial leads Abnormal R-wave progression, early transition Confirmed by Ross Marcus ([id number]) on 10/23/2023 4:18:46 AM  Radiology DG Chest Portable 1 View Result Date: 10/23/2023 CLINICAL DATA:  Shortness of breath EXAM: PORTABLE CHEST 1 VIEW COMPARISON:  12/04/2020 FINDINGS: Artifact from EKG leads. Low volume chest with interstitial crowding. There is no edema, consolidation, effusion, or pneumothorax. Normal heart size and mediastinal contours. IMPRESSION: Limited low volume chest without acute or interval finding. Electronically Signed   By: Tiburcio Pea M.D.   On: 10/23/2023 04:12     Procedures Procedures    Medications Ordered in ED Medications  ipratropium-albuterol (DUONEB) 0.5-2.5 (3) MG/3ML nebulizer solution 3 mL (3 mLs Nebulization Given 10/23/23 0257)  dexamethasone (DECADRON) injection 10 mg (10 mg Intravenous Given 10/23/23 0301)    ED Course/ Medical Decision Making/ A&P Clinical Course as of 10/23/23 0457  Thu Oct 23, 2023  0456 Patient ambulated to maintain pulse ox 97%.  On repeat check, wheezing improved.  Overall he states he feels somewhat better.  Unclear to me why he is nebulizer seems to be ineffective at home.  Recommend that he try his albuterol inhaler. [CH]    Clinical Course User Index [CH] Gearldene Fiorenza, Mayer Masker, MD  Medical Decision Making Amount and/or Complexity of Data Reviewed Labs: ordered. Radiology: ordered.  Risk Prescription drug management.   This patient presents to the ED for concern of shortness of breath, this involves an extensive number of treatment options, and is a complaint that carries with it a high risk of complications and morbidity.  I considered the following differential and admission for this acute, potentially life threatening condition.  The differential diagnosis includes viral illness such as COVID or influenza, COPD exacerbation, pneumonia, PE, atypical ACS  MDM:    This is a 63 year old male who presents with shortness of breath.  He is overall nontoxic-appearing.  He is in no respiratory distress.  He does have some wheezing on exam.  Does not appear overtly volume overloaded.  EKG shows no evidence of acute arrhythmia or ischemia.  Doubt ACS.  Given wheezing, would favor pulmonary pathology.  Chest x-ray without pneumothorax or pneumonia.  COVID and influenza are negative.  BNP is normal.  Labs notable only for creatinine of 1.33.  Patient was given Decadron and a DuoNeb.  On recheck he is improved.  States he is confused because his nebs at home seems to worsen him.   Suspect this is likely a COPD exacerbation.  Recommend that he use his albuterol inhaler if this is more comfortable for him.  Unclear to me why he is having paradoxical reaction to his nebulizer at home.  Recommend follow-up with his pulmonologist.  Patient ambulated maintain pulse ox.  (Labs, imaging, consults)  Labs: I Ordered, and personally interpreted labs.  The pertinent results include: CBC, BMP, BNP, COVID, influenza  Imaging Studies ordered: I ordered imaging studies including chest x-ray I independently visualized and interpreted imaging. I agree with the radiologist interpretation  Additional history obtained from wife at bedside.  External records from outside source obtained and reviewed including prior evaluation and notes  Cardiac Monitoring: The patient was maintained on a cardiac monitor.  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   BPH with obstruction/lower urinary tract symptoms 10/09/2022   Carcinoma of prostate (HCC) 05/06/2022   CHF (congestive heart failure) (HCC) 06/12/2020   Chronic atrial fibrillation, unspecified (HCC) 01/01/2021   Constipation 01/01/2021   Degenerative arthritis 10/23/2023   Diabetes mellitus (HCC) 06/12/2020   Dyspnea 01/24/2023   ED (erectile dysfunction) 10/23/2023   Elevated PSA 10/23/2023   Gout 06/12/2020   Hearing loss 10/23/2023   History of gout 10/23/2023   Hypertension    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        [id number]_add_obesity_prob     Neuropathy 01/01/2021   Obesity 10/23/2023   Obstructive sleep apnea syndrome 01/01/2021   Osteoarthritis 01/01/2021   Prediabetes    Pure hypercholesterolemia 01/01/2021   Sleep apnea 06/12/2020   Type 2 diabetes mellitus (HCC) 01/01/2021     Medicines Meds ordered this encounter  Medications   ipratropium-albuterol (DUONEB) 0.5-2.5 (3) MG/3ML nebulizer solution 3 mL   dexamethasone (DECADRON) injection 10 mg  I have reviewed the patients home medicines and have made adjustments as needed  Problem List / ED Course: Problem List Items Addressed This Visit   None Visit Diagnoses       COPD exacerbation (HCC)    -  Primary   Relevant Medications   ipratropium-albuterol (DUONEB) 0.5-2.5 (3) MG/3ML nebulizer solution 3 mL (Completed)   dexamethasone (DECADRON) injection 10 mg (Completed)                   Final Clinical Impression(s) / ED Diagnoses Final diagnoses:  COPD exacerbation (HCC)    Rx / DC Orders ED Discharge Orders     None         Jareli Highland, Mayer Masker, MD 10/23/23 0505

## 2023-10-27 DIAGNOSIS — Z944 Liver transplant status: Principal | ICD-10-CM

## 2023-10-27 DIAGNOSIS — E612 Magnesium deficiency: Principal | ICD-10-CM

## 2023-10-27 DIAGNOSIS — Z5181 Encounter for therapeutic drug level monitoring: Principal | ICD-10-CM

## 2023-10-28 DIAGNOSIS — R739 Hyperglycemia, unspecified: Principal | ICD-10-CM

## 2023-10-28 MED ORDER — OZEMPIC 0.25 MG OR 0.5 MG (2 MG/3 ML) SUBCUTANEOUS PEN INJECTOR
SUBCUTANEOUS | 11 refills | 56.00 days
Start: 2023-10-28 — End: ?

## 2023-10-28 NOTE — Unmapped (Signed)
 Pt request for RX Refill

## 2023-10-28 NOTE — Unmapped (Signed)
 Please forward to patient's pcp for further management.

## 2023-11-03 DIAGNOSIS — E612 Magnesium deficiency: Principal | ICD-10-CM

## 2023-11-03 DIAGNOSIS — Z5181 Encounter for therapeutic drug level monitoring: Principal | ICD-10-CM

## 2023-11-03 DIAGNOSIS — Z944 Liver transplant status: Principal | ICD-10-CM

## 2023-11-10 ENCOUNTER — Ambulatory Visit: Admit: 2023-11-10 | Discharge: 2023-11-11 | Attending: Nephrology | Primary: Nephrology

## 2023-11-10 DIAGNOSIS — I10 Essential (primary) hypertension: Principal | ICD-10-CM

## 2023-11-10 DIAGNOSIS — N189 Chronic kidney disease, unspecified: Principal | ICD-10-CM

## 2023-11-10 DIAGNOSIS — T451X5S Adverse effect of antineoplastic and immunosuppressive drugs, sequela: Principal | ICD-10-CM

## 2023-11-10 DIAGNOSIS — E119 Type 2 diabetes mellitus without complications: Principal | ICD-10-CM

## 2023-11-10 DIAGNOSIS — Z944 Liver transplant status: Principal | ICD-10-CM

## 2023-11-10 DIAGNOSIS — N183 Stage 3 chronic kidney disease, unspecified whether stage 3a or 3b CKD (CMS-HCC): Principal | ICD-10-CM

## 2023-11-10 DIAGNOSIS — E612 Magnesium deficiency: Principal | ICD-10-CM

## 2023-11-10 DIAGNOSIS — Z794 Long term (current) use of insulin: Principal | ICD-10-CM

## 2023-11-10 DIAGNOSIS — Z5181 Encounter for therapeutic drug level monitoring: Principal | ICD-10-CM

## 2023-11-10 LAB — PROTEIN / CREATININE RATIO, URINE
CREATININE, URINE: 117.6 mg/dL
PROTEIN URINE: 17.3 mg/dL
PROTEIN/CREAT RATIO, URINE: 0.147

## 2023-11-10 LAB — PARATHYROID HORMONE (PTH): PARATHYROID HORMONE INTACT: 114.1 pg/mL — ABNORMAL HIGH (ref 18.4–80.1)

## 2023-11-10 LAB — RENAL FUNCTION PANEL
ALBUMIN: 3.7 g/dL (ref 3.4–5.0)
ANION GAP: 11 mmol/L (ref 5–14)
BLOOD UREA NITROGEN: 25 mg/dL — ABNORMAL HIGH (ref 9–23)
BUN / CREAT RATIO: 16
CALCIUM: 9.5 mg/dL (ref 8.7–10.4)
CHLORIDE: 100 mmol/L (ref 98–107)
CO2: 25 mmol/L (ref 20.0–31.0)
CREATININE: 1.54 mg/dL — ABNORMAL HIGH (ref 0.73–1.18)
EGFR CKD-EPI (2021) MALE: 50 mL/min/1.73m2 — ABNORMAL LOW (ref >=60)
GLUCOSE RANDOM: 367 mg/dL — ABNORMAL HIGH (ref 70–179)
PHOSPHORUS: 4.1 mg/dL (ref 2.4–5.1)
POTASSIUM: 4.7 mmol/L (ref 3.4–4.8)
SODIUM: 136 mmol/L (ref 135–145)

## 2023-11-10 LAB — LIPID PANEL
CHOLESTEROL/HDL RATIO SCREEN: 3.5 (ref 1.0–4.5)
CHOLESTEROL: 117 mg/dL (ref <=200)
HDL CHOLESTEROL: 33 mg/dL — ABNORMAL LOW (ref 40–60)
LDL CHOLESTEROL CALCULATED: 59 mg/dL (ref 40–99)
NON-HDL CHOLESTEROL: 84 mg/dL (ref 70–130)
TRIGLYCERIDES: 125 mg/dL (ref 0–150)
VLDL CHOLESTEROL CAL: 25 mg/dL (ref 12–42)

## 2023-11-10 LAB — ALBUMIN / CREATININE URINE RATIO
ALBUMIN QUANT URINE: 1.3 mg/dL
ALBUMIN/CREATININE RATIO: 11.1 ug/mg (ref 0.0–30.0)
CREATININE, URINE: 116.7 mg/dL

## 2023-11-10 NOTE — Unmapped (Addendum)
 VISIT SUMMARY:  You came in today for an evaluation of your kidney function. We discussed your history of liver transplant, diabetes, and high blood pressure, and reviewed your current medications and overall health status.     YOUR PLAN:  -CHRONIC KIDNEY DISEASE: Chronic kidney disease means your kidneys are not working as well as they should. We will monitor your kidney function regularly and check for protein in your urine to see if additional treatments are needed. Avoid medications that can harm your kidneys and maintain a healthy lifestyle.     -HYPERTENSION: Hypertension, or high blood pressure, is being managed with your current medications. Keeping your blood pressure under control is important for protecting your kidneys. Please monitor your blood pressure at home and record your readings. We may adjust your medications based on these readings.    -HYPERKALEMIA: Hyperkalemia means you have high levels of potassium in your blood. Your recent potassium level was 5.4. You have Veltassa at home to manage this if needed. We will monitor your potassium levels regularly and you should follow dietary guidelines to avoid high potassium foods like potatoes.    -DIABETES MELLITUS: Diabetes mellitus is a condition where your blood sugar levels are too high. You are managing this with [place] and NovoLog. Continue using your continuous glucose monitor and follow up with your endocrinologist or primary doctor for ongoing management.

## 2023-11-10 NOTE — Unmapped (Signed)
 Referring Provider: Marene Lenz, MD     PCP:  Matthew Kins, MD    11/10/2023    Chief Complaint: establish care for CKD    HPI:  [name] is a 63 y.o. male with a h/o DM, HTN, and liver transplant in 01/2022 who presents to establish care for CKD.      He is referred by Matthew Lenz, MD. Notes are reviewed in advance of the visit.     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 His current immunosuppressive regimen is: tacrolimus 1.5mg  in am, 1mg  in pm.     PCP is Dr. [name] at [facility] in [place].  He has followed with endocrinology in the past but has not been seen since last year, is planning to make an appointment soon.    History of Present Illness  He underwent a liver transplant in June 2023 and is currently on tacrolimus, taking 1.5 mg in the morning and 1 mg in the evening. He was previously on everolimus but has since been taken off it.    He has a history of diabetes, diagnosed over five years ago, and uses a continuous glucose monitor. He was on Ozempic but discontinued it due to severe diarrhea. He currently takes [place] 15 units daily and NovoLog. He was previously on metformin but stopped before his liver transplant.     He has been managing high blood pressure for at least five years, currently taking amlodipine 10 mg daily, carvedilol 12.5 mg twice daily, and chlorthalidone 25 mg daily. Home blood pressure monitoring is recommended to ensure optimal control.    He had adrenal insufficiency before his liver transplant but no longer has this condition. He wore a medical bracelet for it but was removed from the program after a test showed improvement.    No NSAID use (infrequent use previously but reports none in about 3 years).     PAST MEDICAL HISTORY:  Past Medical History:   Diagnosis Date    AKI (acute kidney injury) 12/14/2020    Anxiety 10/22/2013    Arthritis     Cervical radiculopathy 12/03/2016    Chronic pain disorder     Lower back    Cirrhosis     Dental abscess 10/2020    Duodenal ulcer 12/01/2017    GERD (gastroesophageal reflux disease)     History of transfusion     Hyperlipidemia 10/22/2013    Hypertension     under control with meds and weight loss    Hypertension associated with diabetes 09/25/2021    Hypotension 01/29/2022    Liver disease     Liver transplant status 02/06/2022    Sleep apnea, obstructive     Have machine    Stroke     mild stroke    Type 2 diabetes mellitus with diabetic neuropathy, with long-term current use of insulin 06/09/2014       ALLERGIES  Bee venom protein (honey bee) and Venom-honey bee    SOCIAL HISTORY  Social History     Socioeconomic History  Marital status: Married   Tobacco Use    Smoking status: Never    Smokeless tobacco: Never   Vaping Use    Vaping status: Never Used   Substance and Sexual Activity    Alcohol use: Not Currently    Drug use: Not Currently    Sexual activity: Not Currently     Partners: Female     Birth control/protection: Surgical, Bilateral Tubal Ligation     Comment: wife   Other Topics Concern    Exercise Yes    Living Situation Yes     Comment: Married     Social Drivers of Health     Financial Resource Strain: High Risk (10/19/2023)    Received from [facility]    Overall Financial Resource Strain (CARDIA)     Difficulty of Paying Living Expenses: Very hard   Food Insecurity: Food Insecurity Present (10/19/2023)    Received from [facility]    Hunger Vital Sign     Worried About Running Out of Food in the Last Year: Sometimes true     Ran Out of Food in the Last Year: Often true   Transportation Needs: No Transportation Needs (10/19/2023)    Received from [facility] - Transportation     Lack of Transportation (Medical): No     Lack of Transportation (Non-Medical): No   Physical Activity: Insufficiently Active (10/19/2023)    Received from [facility]    Exercise Vital Sign     Days of Exercise per Week: 1 day     Minutes of Exercise per Session: 10 min   Stress: Stress Concern Present (10/19/2023)    Received from [facility] - Occupational Stress Questionnaire     Feeling of Stress : To some extent   Social Connections: Socially Integrated (10/19/2023)    Received from [facility]    Social Network     How would you rate your social network (family, work, friends)?: Good participation with social networks   Housing: High Risk (10/19/2023)    Received from [facility] Stability Vital Sign     Unable to Pay for Housing in the Last Year: Yes     Homeless in the Last Year: No         FAMILY HISTORY  Family History   Problem Relation Age of Onset    Edema Mother     Alzheimer's disease Father     Aortic dissection Brother     Early death Brother     Aneurysm Brother    Reports brother had kidney disease, but unsure of cause. No other known family history of kidney disease.   Reports mother died from blood clot.     MEDICATIONS:  Current Outpatient Medications   Medication Sig Dispense Refill    acetaminophen (TYLENOL) 500 MG tablet Take 1 tablet (500 mg total) by mouth every six (6) hours as needed for pain. 30 tablet 0    amlodipine (NORVASC) 10 MG tablet Take 1 tablet (10 mg total) by mouth daily. 90 tablet 3    aspirin (ECOTRIN) 81 MG tablet Take 1 tablet (81 mg total) by mouth daily. 90 tablet 3    atorvastatin (LIPITOR) 40 MG tablet Take 1 tablet (40 mg total) by mouth daily. 90 tablet 3    blood sugar diagnostic (ACCU-CHEK GUIDE TEST STRIPS) Strp Use to check blood sugar as directed with insulin 3 times  a day & for symptoms of high or low blood sugar 100 strip 11    blood-glucose meter Misc Use to test blood sugar as directed 1 each 0    carvedilol (COREG) 12.5 MG tablet Take 1 tablet (12.5 mg total) by mouth two (2) times a day. 180 tablet 3    chlorthalidone (HYGROTON) 25 MG tablet Take 1 tablet (25 mg total) by mouth every morning. 30 tablet 11    DULoxetine (CYMBALTA) 30 MG capsule Take 1 capsule (30 mg total) by mouth daily. 90 capsule 1    insulin aspart (NOVOLOG FLEXPEN U-100 INSULIN) 100 unit/mL (3 mL) injection pen Inject under the skin per sliding scale prior to meals. If premeal BG 151-200 take 2 additional units for correction, if 201-250 take 4 additional units, etc. Max dose 36 units per day. 15 mL 0    insulin degludec (TRESIBA FLEXTOUCH U-100) 100 unit/mL (3 mL) InPn Inject 0.15 mL (15 Units total) under the skin daily. Adjust as directed. 15 mL 3    lancets (ACCU-CHEK SOFTCLIX LANCETS) Misc Use to check blood sugar as directed with insulin 3 times a day & for symptoms of high or low blood sugar. 100 each 0    melatonin 3 mg Tab Take 2 tablets (6 mg total) by mouth.      methocarbamol (ROBAXIN) 500 MG tablet Take 2 tablets (1,000 mg total) by mouth Three (3) times a day as needed. 15 tablet 0    oxyCODONE (ROXICODONE) 5 MG immediate release tablet Take 1 tablet (5 mg total) by mouth every six (6) hours as needed for pain for up to 5 doses. 5 tablet 0    pantoprazole (PROTONIX) 40 MG tablet Take 1 tablet (40 mg total) by mouth daily as needed. 30 tablet 11    semaglutide (OZEMPIC) 0.25 mg or 0.5 mg (2 mg/3 mL) PnIj Inject 0.5 mg under the skin every seven (7) days. 6 mL 11    tacrolimus (PROGRAF) 0.5 MG capsule Take Three caps (1.5mg ) in AM and Take Two caps (1mg ) at PM 150 capsule 11    tamsulosin (FLOMAX) 0.4 mg capsule Take 1 capsule (0.4 mg total) by mouth every morning.      blood-glucose meter kit Use as instructed 1 each 0     No current facility-administered medications for this visit.       PHYSICAL EXAM:     Vitals:    11/10/23 0953   BP: 141/64   Pulse: 58   Temp: 35.9 ??C (96.6 ??F)     CONSTITUTIONAL: Alert, well appearing, no distress  HEENT: Moist mucous membranes, OP clear. Extra ocular movements intact. Sclerae anicteric.  CARDIOVASCULAR: RRR  PULM: Clear to auscultation bilaterally  EXTREMITIES: No lower extremity edema bilaterally.   NEUROLOGIC: No gross focal motor deficits  PSYCH: alert and oriented x 3    MEDICAL DECISION MAKING    Results for orders placed or performed in visit on 09/08/23   CBC w/ Differential   Result Value Ref Range    WBC 8.0 3.4 - 10.8 x10E3/uL    RBC 5.27 4.14 - 5.80 x10E6/uL    HGB 13.8 13.0 - 17.7 g/dL    HCT 62.9 52.8 - 41.3 %    MCV 83 79 - 97 fL    MCH 26.2 (L) 26.6 - 33.0 pg    MCHC 31.6 31.5 - 35.7 g/dL    RDW 24.4 01.0 - 27.2 %    Platelet 208 150 - 450 x10E3/uL  Neutrophils % 61 Not Estab. %    Lymphocytes % 24 Not Estab. %    Monocytes % 9 Not Estab. %    Eosinophils % 5 Not Estab. %    Basophils % 0 Not Estab. %    Absolute Neutrophils 4.9 1.4 - 7.0 x10E3/uL    Absolute Lymphocytes 1.9 0.7 - 3.1 x10E3/uL    Absolute Monocytes  0.7 0.1 - 0.9 x10E3/uL    Absolute Eosinophils 0.4 0.0 - 0.4 x10E3/uL    Absolute Basophils  0.0 0.0 - 0.2 x10E3/uL    Immature Granulocytes 1 Not Estab. %    Bands Absolute 0.0 0.0 - 0.1 x10E3/uL   Comprehensive Metabolic Panel   Result Value Ref Range    Glucose 160 (H) 70 - 99 mg/dL    BUN 20 8 - 27 mg/dL    Creatinine 1.61 (H) 0.76 - 1.27 mg/dL    eGFR 41 (L) >09 UE/AVW/0.98    BUN/Creatinine Ratio 11 10 - 24    Sodium 138 134 - 144 mmol/L    Potassium 5.4 (H) 3.5 - 5.2 mmol/L    Chloride 99 96 - 106 mmol/L    CO2 22 20 - 29 mmol/L    Calcium 9.2 8.6 - 10.2 mg/dL    Total Protein 6.8 6.0 - 8.5 g/dL    Albumin 4.1 3.9 - 4.9 g/dL    Globulin, Total 2.7 1.5 - 4.5 g/dL    Total Bilirubin 0.4 0.0 - 1.2 mg/dL    Alkaline Phosphatase 252 (H) 44 - 121 IU/L    AST 10 0 - 40 IU/L ALT 8 0 - 44 IU/L   Bilirubin, Direct   Result Value Ref Range    Bilirubin, Direct 0.13 0.00 - 0.40 mg/dL   Phosphorus Level   Result Value Ref Range    Phosphorus, Serum 3.7 2.8 - 4.1 mg/dL   Magnesium Level   Result Value Ref Range    Magnesium 1.9 1.6 - 2.3 mg/dL   Gamma GT   Result Value Ref Range    GGT 37 0 - 65 IU/L   Tacrolimus level   Result Value Ref Range    Tacrolimus Lvl 5.2 2.0 - 20.0 ng/mL     *Note: Due to a large number of results and/or encounters for the requested time period, some results have not been displayed. A complete set of results can be found in Results Review.        Creatinine   Date Value Ref Range Status   09/08/2023 1.81 (H) 0.76 - 1.27 mg/dL Final   11/91/4782 9.56 (H) 0.76 - 1.27 mg/dL Final   21/30/8657 8.46 (H) 0.76 - 1.27 mg/dL Final   96/29/5284 1.32 (H) 0.76 - 1.27 mg/dL Final   44/08/270 5.36 0.73 - 1.18 mg/dL Final   64/40/3474 2.59 (H) 0.73 - 1.18 mg/dL Final   56/38/7564 3.32 (H) 0.73 - 1.18 mg/dL Final   95/18/8416 6.06 (H) 0.73 - 1.18 mg/dL Final   30/16/0109 3.23 (H) 0.73 - 1.18 mg/dL Final   55/73/2202 5.42 (H) 0.76 - 1.27 mg/dL Final        No results for input(s): NA, K, CL, BUN, CREATININE, GFR, GLU in the last 24 hours.    Invalid input(s): C02          UA:   Urine Sediment: oval Ca oxalate crystals, otherwise bland    IMAGING STUDIES:     CT A/P w/o contrast March 22, 2023-   Impression   1.  Small  mesenteric fat-containing midline ventral, umbilical, and left-sided inguinal hernias are noted (as detailed). No associated fat stranding or free fluid is identified to suggest ischemia or vascular compromise. Clinical correlation with physical exam is recommended.   2.  Nonspecific haziness of the central mesentery with scattered subcentimeter central mesenteric lymph nodes also noted. Findings are overall stable to slightly increased in conspicuity when compared to prior exam. Findings are nonspecific, however, could potentially be seen in the setting of sclerosing mesenteritis. Clinical correlation is recommended.   3.  Other chronic or incidental findings, as described within the body of the report.   KIDNEYS/URETERS: Smooth renal contours.  No nephrolithiasis.  No ureteral dilatation or collecting system distention.     ASSESSMENT/PLAN:    [name] is a 63 y.o. patient with a past medical history significant for diabetes, hypertension, and liver transplant who is being seen in clinic.     CKD III: CKD dating back for about 2-3 years.  Reviewed the trend in his kidney function with him today.  He had worsening of his kidney function in the approximately 6 months leading up to his liver transplant, presumably 2/2 his liver failure.  His renal function improved a couple months posttransplant but not to normal/prior levels.  He has had a little progression in his kidney dysfunction since that time. Most recent baseline creatinine appears to be 1.6-1.8.   Lab Results   Component Value Date    CREATININE 1.81 (H) 09/08/2023   - Most recent eGFR 41, discussed that recent labs with GFR ~40-50  - SPEP/IFE, SFLC to eval for paraproteinemia  - The patient was counseled on avoiding nephrotoxic agents including NSAIDs. He reports that he knows to avoid NSAIDs and does so. Had used in the past when not able to take tylenol but reports he did not use frequently.   - On atorvastatin 40mg  daily. LDL goal <70 given DM. Most Recent LDL: 144  LDL date: 02/04/2023 Recheck lipid panel today.   - UPC 0.43 in 08/2022. Check UACR and UPC today. ACEi/ARB - none currently, but high potassium is a barrier. SGLT2i - none currently.  If albuminuria present would benefit from either or both of these.  - Hgb 13.8 in 08/2023 - no anemia  - Discussed that good control of blood pressure and diabetes will be important to slow progression of kidney disease, addressed below.     Bone Mineral Metabolism:   Lab Results   Component Value Date    CALCIUM 9.2 09/08/2023   - Check PTH, Ca, phos, Vit D    HTN: Goal <130/80. Thinks he's had this for at least 5 years.   BP Readings from Last 3 Encounters:   11/10/23 141/64   07/22/23 128/71   07/04/23 117/77     Lab Results   Component Value Date    K 5.4 (H) 09/08/2023   - Current meds: amlodipine 10mg , chlorthalidone 25mg , Coreg 12.5mg  BID.   -A little elevated in clinic today, but didn't take his medications today because he was coming here and wasn't sure if he was supposed to.  His blood pressure was well-controlled on prior clinic readings in November and December.  Advised to monitor at home and bring readings to next visit.     Hyperkalemia: likely 2/2 CKD, tacrolimus. Coreg could contribute as well. He has a h/o adrenal insufficiency - states he had this prior to liver xplant, but no longer (states he had a test for this just prior  to liver transplant which was negative so meds stopped).   Lab Results   Component Value Date    K 5.4 (H) 09/08/2023   - Consider switching Coreg to an alternative if this remains problematic   - Provided information on low K diet but he has been educated on this before, sounds like potatoes are a challenge for him  - Has [place] at home (not taking currently), typically takes prn if K high    DM: diagnosed in 2017 per notes. Notes state this is c/b neuropathy but he doesn't think he has this. Has been followed by endocrinology but not seen since last year. Managed by endocrinology but not seen recently.   Lab Results   Component Value Date    A1C 6.8 (H) 05/08/2023   - Most recent A1C at goal  - Check urine microalbumin today  - Current medications: [place] 15u daily, Novolog insulin  - Was on Ozempic but states he had to stop this due to diarrhea. Was on metformin in the past, not sure why it was stopped.   - He is going to make an appointment to follow-up with endocrinology.  Would benefit from SGLT2i if albuminuria present, though at his current GFR may not have a big impact on glycemic control.   -Not recommended to start metformin when GFR <45 so likely not an option going forward     [name] will follow up in 6 months  The patient will need renal function panel, UACR within 4 weeks prior to next visit.      I personally spent 80 minutes face-to-face and non-face-to-face in the care of this patient, which includes all pre, intra, and post visit time on the date of service.

## 2023-11-12 LAB — SERUM PROTEIN ELECTROPHORESIS AND IMMUNOFIXATION
ALBUMIN (SPE): 3.9 g/dL (ref 3.5–5.0)
ALPHA-1 GLOBULIN: 0.4 g/dL (ref 0.2–0.5)
ALPHA-2 GLOBULIN: 1 g/dL (ref 0.5–1.1)
BETA-1 GLOBULIN: 0.4 g/dL (ref 0.3–0.6)
BETA-2 GLOBULIN: 0.4 g/dL (ref 0.2–0.6)
GAMMAGLOBULIN: 1.2 g/dL (ref 0.5–1.5)
PROTEIN TOTAL (SPECIAL CHEM): 7.3 g/dL

## 2023-11-12 LAB — SERUM FREE LIGHT CHAINS
K/L FLC RATIO: 1.55 (ref 0.26–1.65)
KAPPA FREE,SERUM: 4.42 mg/dL — ABNORMAL HIGH (ref 0.33–1.94)
LAMBDA FREE, SER: 2.86 mg/dL — ABNORMAL HIGH (ref 0.57–2.63)

## 2023-11-17 DIAGNOSIS — Z5181 Encounter for therapeutic drug level monitoring: Principal | ICD-10-CM

## 2023-11-17 DIAGNOSIS — E612 Magnesium deficiency: Principal | ICD-10-CM

## 2023-11-17 DIAGNOSIS — Z944 Liver transplant status: Principal | ICD-10-CM

## 2023-11-24 DIAGNOSIS — Z5181 Encounter for therapeutic drug level monitoring: Principal | ICD-10-CM

## 2023-11-24 DIAGNOSIS — Z944 Liver transplant status: Principal | ICD-10-CM

## 2023-11-24 DIAGNOSIS — E612 Magnesium deficiency: Principal | ICD-10-CM

## 2023-12-01 DIAGNOSIS — Z944 Liver transplant status: Principal | ICD-10-CM

## 2023-12-01 DIAGNOSIS — Z5181 Encounter for therapeutic drug level monitoring: Principal | ICD-10-CM

## 2023-12-01 DIAGNOSIS — E612 Magnesium deficiency: Principal | ICD-10-CM

## 2023-12-02 NOTE — Unmapped (Signed)
 Specialty Medication(s): tacrolimus     Matthew Key has been dis-enrolled from the [facility] specialty pharmacy services as a result of a pharmacy change. The patient is now filling at [facility] .    Additional information provided to the patient: n/a     Matthew Key, [facility]  [facility] Specialty Pharmacist

## 2023-12-03 DIAGNOSIS — Z796 Long-term use of immunosuppressant medication: Principal | ICD-10-CM

## 2023-12-03 DIAGNOSIS — Z944 Liver transplant status: Principal | ICD-10-CM

## 2023-12-03 MED ORDER — TACROLIMUS 0.5 MG CAPSULE, IMMEDIATE-RELEASE
ORAL_CAPSULE | Freq: Every day | ORAL | 11 refills | 30.00 days | Status: CP
Start: 2023-12-03 — End: 2023-12-03
  Filled 2023-12-04: qty 30, 30d supply, fill #0

## 2023-12-03 MED ORDER — TACROLIMUS 1 MG CAPSULE, IMMEDIATE-RELEASE
ORAL_CAPSULE | Freq: Two times a day (BID) | ORAL | 11 refills | 30.00 days | Status: CP
Start: 2023-12-03 — End: ?
  Filled 2023-12-04: qty 60, 30d supply, fill #0

## 2023-12-03 MED FILL — ATORVASTATIN 40 MG TABLET: ORAL | 90 days supply | Qty: 90 | Fill #3

## 2023-12-04 NOTE — Unmapped (Addendum)
 [facility] Pharmacist has reviewed a new prescription for tacrolimus  that indicates a dose  dose is the same but using 1mg  and 0.5mg  capsules .  Patient was counseled on this dosage change by [facility]- see epic note from 12/04/23.  Next refill call date adjusted if necessary.        Clinical Assessment Needed For: Dose Change  Medication: Tacrolimus  0.5mg  capsule  Last Fill Date/Day Supply: 08/27/2023 / 30 days  Copay $1.56 for 30ds  Was previous dose already scheduled to fill: No    Notes to Pharmacist: N/A      Clinical Assessment Needed For: Dose Change  Medication: Tacrolimus  1mg  capsule  Last Fill Date/Day Supply: 01/07/2023 / 30 days  Copay $3.96 for 30ds  Was previous dose already scheduled to fill: No    Notes to Pharmacist: N/A

## 2023-12-04 NOTE — Unmapped (Signed)
 [facility] Clinical Assessment & Refill Coordination Note    Matthew Key, Matthew Key: [birth year]  Phone: [phone] (home) [phone] (work)    All above HIPAA information was verified with patient.     Was a Nurse, learning disability used for this call? No    Specialty Medication(s):   Transplant: tacrolimus 1mg  and tacrolimus 0.5mg      Current Outpatient Medications   Medication Sig Dispense Refill    acetaminophen (TYLENOL) 500 MG tablet Take 1 tablet (500 mg total) by mouth every six (6) hours as needed for pain. 30 tablet 0    amlodipine (NORVASC) 10 MG tablet Take 1 tablet (10 mg total) by mouth daily. 90 tablet 3    aspirin (ECOTRIN) 81 MG tablet Take 1 tablet (81 mg total) by mouth daily. 90 tablet 3    atorvastatin (LIPITOR) 40 MG tablet Take 1 tablet (40 mg total) by mouth daily. 90 tablet 3    blood sugar diagnostic (ACCU-CHEK GUIDE TEST STRIPS) Strp Use to check blood sugar as directed with insulin 3 times a day & for symptoms of high or low blood sugar 100 strip 11    blood-glucose meter kit Use as instructed 1 each 0    blood-glucose meter Misc Use to test blood sugar as directed 1 each 0    carvedilol (COREG) 12.5 MG tablet Take 1 tablet (12.5 mg total) by mouth two (2) times a day. 180 tablet 3    chlorthalidone (HYGROTON) 25 MG tablet Take 1 tablet (25 mg total) by mouth every morning. 30 tablet 11    DULoxetine (CYMBALTA) 30 MG capsule Take 1 capsule (30 mg total) by mouth daily. 90 capsule 1    insulin aspart (NOVOLOG FLEXPEN U-100 INSULIN) 100 unit/mL (3 mL) injection pen Inject under the skin per sliding scale prior to meals. If premeal BG 151-200 take 2 additional units for correction, if 201-250 take 4 additional units, etc. Max dose 36 units per day. 15 mL 0    insulin degludec (TRESIBA FLEXTOUCH U-100) 100 unit/mL (3 mL) InPn Inject 0.15 mL (15 Units total) under the skin daily. Adjust as directed. 15 mL 3    lancets (ACCU-CHEK SOFTCLIX LANCETS) Misc Use to check blood sugar as directed with insulin 3 times a day & for symptoms of high or low blood sugar. 100 each 0    melatonin 3 mg Tab Take 2 tablets (6 mg total) by mouth.      methocarbamol (ROBAXIN) 500 MG tablet Take 2 tablets (1,000 mg total) by mouth Three (3) times a day as needed. 15 tablet 0    oxyCODONE (ROXICODONE) 5 MG immediate release tablet Take 1 tablet (5 mg total) by mouth every six (6) hours as needed for pain for up to 5 doses. 5 tablet 0    pantoprazole (PROTONIX) 40 MG tablet Take 1 tablet (40 mg total) by mouth daily as needed. 30 tablet 11    tacrolimus (PROGRAF) 0.5 MG capsule Take 1 capsule (0.5 mg total) by mouth in the morning. Also Take One-1mg  cap in AM for Total AM dose=1.5mg . 30 capsule 11    tacrolimus (PROGRAF) 1 MG capsule Take 1 capsule (1 mg total) by mouth two (2) times a day. In the morning, also take One - 0.5mg  cap in AM for Total AM dose=1.5mg  60 capsule 11    tamsulosin (FLOMAX) 0.4 mg capsule Take 1 capsule (0.4 mg total) by mouth every morning.       No current  facility-administered medications for this visit.        Changes to medications: Matthew Key reports no changes at this time.    Medication list has been reviewed and updated in Epic: Yes    Allergies   Allergen Reactions    Bee Venom Protein (Honey Bee) Swelling    Venom-Honey Bee Swelling       Changes to allergies: No    Allergies have been reviewed and updated in Epic: Yes    SPECIALTY MEDICATION ADHERENCE     Tacrolimus  0.5 mg: 1 days of medicine on hand   Tacrolimus  1 mg: 1 days of medicine on hand     Medication Adherence    Patient reported X missed doses in the last month: 0  Specialty Medication: Tacrolimus  1mg   Patient is on additional specialty medications: Yes  Additional Specialty Medications: Tacrolimus  0.5mg   Patient Reported Additional Medication X Missed Doses in the Last Month: 0  Patient is on more than two specialty medications: No          Specialty medication(s) dose(s) confirmed: Regimen is correct and unchanged.     Are there any concerns with adherence? No    Adherence counseling provided? Not needed    CLINICAL MANAGEMENT AND INTERVENTION      Clinical Benefit Assessment:    Do you feel the medicine is effective or helping your condition? Yes    Clinical Benefit counseling provided? Not needed    Adverse Effects Assessment:    Are you experiencing any side effects? No    Are you experiencing difficulty administering your medicine? 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 For example, brushing your teeth or getting up in the morning. 0    Have you discussed this with your provider? Not needed    Acute Infection Status:    Acute infections noted within Epic:  No active infections    Patient reported infection: None    Therapy Appropriateness:    Is therapy appropriate based on current medication list, adverse reactions, adherence, clinical benefit and progress toward achieving therapeutic goals? Yes, therapy is appropriate and should be continued     Clinical Intervention:    Was an intervention completed as part of this clinical assessment? No    DISEASE/MEDICATION-SPECIFIC INFORMATION      N/A    Solid Organ Transplant: Not Applicable    PATIENT SPECIFIC NEEDS     Does the patient have any physical, cognitive, or cultural barriers? No    Is the patient high risk? No    Does the patient require physician intervention or other additional services (i.e., nutrition, smoking cessation, social work)? No    Does the patient have an additional or emergency contact listed in their chart? Yes    SOCIAL DETERMINANTS OF HEALTH     At the [facility], we have learned that life circumstances - like trouble affording food, housing, utilities, or transportation can affect the health of many of our patients.   That is why we wanted to ask: are you currently experiencing any life circumstances that are negatively impacting your health and/or quality of life? Patient declined to answer    Social Drivers of Health     Food Insecurity: Food Insecurity  Present (10/19/2023)    Received from [facility]    Hunger Vital Sign     Worried About Running Out of Food in the Last Year: Sometimes true     Ran Out of Food in the Last Year: Often true   Tobacco Use: Low Risk  (11/10/2023)    Patient History     Smoking Tobacco Use: Never     Smokeless Tobacco Use: Never     Passive Exposure: Not on file   Transportation Needs: No Transportation Needs (10/19/2023)    Received from [facility] - Transportation     Lack of Transportation (Medical): No     Lack of Transportation (Non-Medical): No   Alcohol Use: Not At Risk (10/19/2023)    Received from [facility]    AUDIT-C     Frequency of Alcohol Consumption: Never     Average Number of Drinks: Not on file     Frequency of Binge Drinking: Not on file   Housing: High Risk (10/19/2023)    Received from [facility] Stability Vital Sign     Unable to Pay for Housing in the Last Year: Yes     Number of Times Moved in the Last Year: Not on file     Homeless in the Last Year: No   Physical Activity: Insufficiently Active (10/19/2023)    Received from [facility]    Exercise Vital Sign     Days of Exercise per Week: 1 day     Minutes of Exercise per Session: 10 min   Utilities: At Risk (10/19/2023)    Received from [facility] Utilities     Threatened with loss of utilities: Yes   Stress: Stress Concern Present (10/19/2023)    Received from [facility] of Occupational [facility] - Occupational Stress Questionnaire     Feeling of Stress : To some extent   Interpersonal Safety: Not on file   Substance Use: Not on file (06/16/2023)   Intimate Partner Violence: Not At Risk (10/19/2023)    Received from [facility]    HITS     Over the last 12 months how often did your partner physically hurt you?: Never     Over the last 12 months how often did your partner insult you or talk down to you?: Never     Over the last 12 months how often did your partner threaten you with physical harm?: Never     Over the last 12 months how often did your partner scream or curse at you?: Never   Social Connections: Socially Integrated (10/19/2023)    Received from [facility]    Social Network     How would you rate your social network (family, work, friends)?: Good participation with social networks   Financial Resource Strain: High Risk (10/19/2023)    Received from [facility]    Overall Financial Resource Strain (CARDIA)     Difficulty of Paying Living Expenses: Very hard   Depression: Not at risk (10/22/2023)    Received from [facility]    Depression     PHQ-2 Total Score: 0   Internet Connectivity: Not on file   Health Literacy: Not on file       Would you be willing to receive help with any of the needs that you have identified today? Not applicable       SHIPPING  Specialty Medication(s) to be Shipped:   Transplant: tacrolimus  0.5mg  and tacrolimus  1mg     Other medication(s) to be shipped: No additional medications requested for fill at this time     Changes to insurance: No    Cost and Payment: Patient has a copay of $5.52. They are aware and have authorized the pharmacy to charge the credit card on file.    Delivery Scheduled: Yes, Expected medication delivery date: 12/05/23.     Medication will be delivered via UPS to the confirmed prescription address in [facility].    The patient will receive a drug information handout for each medication shipped and additional FDA Medication Guides as required.  Verified that patient has previously received a Conservation officer, historic buildings and a Surveyor, mining.    The patient or caregiver noted above participated in the development of this care plan and knows that they can request review of or adjustments to the care plan at any time.      All of the patient's questions and concerns have been addressed.    Eluterio Hamburg, [facility]   [facility] Specialty Pharmacist

## 2023-12-15 DIAGNOSIS — Z5181 Encounter for therapeutic drug level monitoring: Principal | ICD-10-CM

## 2023-12-15 DIAGNOSIS — E612 Magnesium deficiency: Principal | ICD-10-CM

## 2023-12-15 DIAGNOSIS — Z944 Liver transplant status: Principal | ICD-10-CM

## 2023-12-15 DIAGNOSIS — I10 Essential (primary) hypertension: Principal | ICD-10-CM

## 2023-12-15 MED ORDER — CARVEDILOL 6.25 MG TABLET
ORAL_TABLET | ORAL | 0 refills | 0.00000 days
Start: 2023-12-15 — End: ?

## 2023-12-15 NOTE — Unmapped (Signed)
 The patient is requesting a medication refill

## 2023-12-18 LAB — COMPREHENSIVE METABOLIC PANEL
ALBUMIN: 4.3 g/dL (ref 3.9–4.9)
ALKALINE PHOSPHATASE: 246 IU/L — ABNORMAL HIGH (ref 44–121)
ALT (SGPT): 10 IU/L (ref 0–44)
AST (SGOT): 12 IU/L (ref 0–40)
BILIRUBIN TOTAL (MG/DL) IN SER/PLAS: 0.5 mg/dL (ref 0.0–1.2)
BLOOD UREA NITROGEN: 17 mg/dL (ref 8–27)
BUN / CREAT RATIO: 11 (ref 10–24)
CALCIUM: 9.7 mg/dL (ref 8.6–10.2)
CHLORIDE: 98 mmol/L (ref 96–106)
CO2: 25 mmol/L (ref 20–29)
CREATININE: 1.54 mg/dL — ABNORMAL HIGH (ref 0.76–1.27)
EGFR: 50 mL/min/{1.73_m2} — ABNORMAL LOW
GLOBULIN, TOTAL: 2.8 g/dL (ref 1.5–4.5)
GLUCOSE: 285 mg/dL — ABNORMAL HIGH (ref 70–99)
POTASSIUM: 5.2 mmol/L (ref 3.5–5.2)
SODIUM: 136 mmol/L (ref 134–144)
TOTAL PROTEIN: 7.1 g/dL (ref 6.0–8.5)

## 2023-12-18 LAB — CBC W/ DIFFERENTIAL
BANDED NEUTROPHILS ABSOLUTE COUNT: 0 10*3/uL (ref 0.0–0.1)
BASOPHILS ABSOLUTE COUNT: 0 10*3/uL (ref 0.0–0.2)
BASOPHILS RELATIVE PERCENT: 0 %
EOSINOPHILS ABSOLUTE COUNT: 0.3 10*3/uL (ref 0.0–0.4)
EOSINOPHILS RELATIVE PERCENT: 3 %
HEMATOCRIT: 46.1 % (ref 37.5–51.0)
HEMOGLOBIN: 14.6 g/dL (ref 13.0–17.7)
IMMATURE GRANULOCYTES: 0 %
LYMPHOCYTES ABSOLUTE COUNT: 1.7 10*3/uL (ref 0.7–3.1)
LYMPHOCYTES RELATIVE PERCENT: 20 %
MEAN CORPUSCULAR HEMOGLOBIN CONC: 31.7 g/dL (ref 31.5–35.7)
MEAN CORPUSCULAR HEMOGLOBIN: 26.7 pg (ref 26.6–33.0)
MEAN CORPUSCULAR VOLUME: 84 fL (ref 79–97)
MONOCYTES ABSOLUTE COUNT: 0.6 10*3/uL (ref 0.1–0.9)
MONOCYTES RELATIVE PERCENT: 7 %
NEUTROPHILS ABSOLUTE COUNT: 5.9 10*3/uL (ref 1.4–7.0)
NEUTROPHILS RELATIVE PERCENT: 70 %
PLATELET COUNT: 233 10*3/uL (ref 150–450)
RED BLOOD CELL COUNT: 5.47 x10E6/uL (ref 4.14–5.80)
RED CELL DISTRIBUTION WIDTH: 13.7 % (ref 11.6–15.4)
WHITE BLOOD CELL COUNT: 8.5 10*3/uL (ref 3.4–10.8)

## 2023-12-18 LAB — GAMMA GT: GAMMA GLUTAMYL TRANSFERASE: 34 IU/L (ref 0–65)

## 2023-12-18 LAB — BILIRUBIN, DIRECT: BILIRUBIN DIRECT: 0.15 mg/dL (ref 0.00–0.40)

## 2023-12-18 LAB — MAGNESIUM: MAGNESIUM: 1.7 mg/dL (ref 1.6–2.3)

## 2023-12-18 LAB — PHOSPHORUS: PHOSPHORUS, SERUM: 3.7 mg/dL (ref 2.8–4.1)

## 2023-12-19 LAB — TACROLIMUS LEVEL: TACROLIMUS BLOOD: 3.2 ng/mL — ABNORMAL LOW (ref 5.0–20.0)

## 2023-12-22 DIAGNOSIS — Z5181 Encounter for therapeutic drug level monitoring: Principal | ICD-10-CM

## 2023-12-22 DIAGNOSIS — Z944 Liver transplant status: Principal | ICD-10-CM

## 2023-12-22 DIAGNOSIS — E612 Magnesium deficiency: Principal | ICD-10-CM

## 2023-12-29 DIAGNOSIS — Z5181 Encounter for therapeutic drug level monitoring: Principal | ICD-10-CM

## 2023-12-29 DIAGNOSIS — Z944 Liver transplant status: Principal | ICD-10-CM

## 2023-12-29 DIAGNOSIS — E612 Magnesium deficiency: Principal | ICD-10-CM

## 2024-01-02 NOTE — Unmapped (Signed)
 Patient's Tac level subpar at 3.2. Reached out per Dr.Moon to see if it was a reliable level. Left detailed VM explaining low Tac and encouraging patient to repeat labs again in 1 mo if this lab result was reliable.

## 2024-01-05 DIAGNOSIS — E612 Magnesium deficiency: Principal | ICD-10-CM

## 2024-01-05 DIAGNOSIS — Z944 Liver transplant status: Principal | ICD-10-CM

## 2024-01-05 DIAGNOSIS — Z5181 Encounter for therapeutic drug level monitoring: Principal | ICD-10-CM

## 2024-01-09 NOTE — Unmapped (Signed)
 Patient called Matthew Key today. Discussed his last low Tac and elevated glucose levels. He shared that the oxempic was causing his diarrhea, and mounjaro caused it as well. He reports he sees his endocrinologist locally and agreed to reach out to discuss better glucose mgmt.

## 2024-01-12 DIAGNOSIS — Z944 Liver transplant status: Principal | ICD-10-CM

## 2024-01-12 DIAGNOSIS — Z5181 Encounter for therapeutic drug level monitoring: Principal | ICD-10-CM

## 2024-01-12 DIAGNOSIS — E612 Magnesium deficiency: Principal | ICD-10-CM

## 2024-01-19 DIAGNOSIS — Z944 Liver transplant status: Principal | ICD-10-CM

## 2024-01-19 DIAGNOSIS — Z5181 Encounter for therapeutic drug level monitoring: Principal | ICD-10-CM

## 2024-01-19 DIAGNOSIS — E612 Magnesium deficiency: Principal | ICD-10-CM

## 2024-01-20 NOTE — Unmapped (Signed)
 Matthew Key has been contacted in regards to their refill of tacrolimus 1mg  and 0.5mg . At this time, they have declined refill due to pt has a 30 day supply. Refill assessment call date has been updated per the patient's request.

## 2024-01-21 NOTE — Unmapped (Signed)
 Placed return call to Pt, Pt would like to try and schedule his 06/27 appts with Dr. Wells Halim and MRCP for earlier as he is nervous, reports feeling fine just nervous. TNC explained that it most likely would not be able to be resch for earlier than 06/27 but would let TPA know to make attempt. Pt verbalized understanding.    Routing note to TPA.

## 2024-01-26 DIAGNOSIS — Z5181 Encounter for therapeutic drug level monitoring: Principal | ICD-10-CM

## 2024-01-26 DIAGNOSIS — Z944 Liver transplant status: Principal | ICD-10-CM

## 2024-01-26 DIAGNOSIS — E612 Magnesium deficiency: Principal | ICD-10-CM

## 2024-01-26 NOTE — Unmapped (Signed)
 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 Patient had a hernia repair 06/2023 complicated by ileus.  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  Managing HTN and DM.  - Seeing nephrology  - Management of IS and HTN as above     Pancreatic Cyst: Pancreatic cyst present on MRI 11/24/22, which were reportedly unchanged from prior however MRI from 2022 stated largest cyst was 0.7cm (now 1.4cm).   - repeat MRI/MRCP to monitor cystic lesion scheduled 02/29/24     #Preventive Hepatology Care:  - Colonoscopy: UTD, next due 2033  - Flu shot: UTD  - COVID: UTD  - Recommend dermatology evaluation annually due to risk of skin cancers with IS    I personally spent 45 minutes face-to-face and non-face-to-face in the care of this patient, which includes all pre, intra, and post visit time on the date of service.  All documented time was specific to the E/M visit and does not include any procedures that may have been performed. 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 Patient had a hernia repair 06/2023 complicated by ileus.    History of Present Illness  Matthew Key is a 63 year old male with a history of liver transplant who presents for a routine follow-up.    He underwent a liver transplant in June 2023 due to cirrhosis. Post-transplant, he experienced elevated liver enzymes, which were investigated with a liver biopsy that was negative for rejection. An ERCP revealed a biliary stricture, managed with stenting. The stent was removed in March 2024, and he developed post-ERCP pancreatitis in June 2024. A subsequent ERCP in December 2024 resolved his anastomotic stricture.    He feels generally well  without significant problems or pain related to the liver transplant. He recalls feeling unwell during the stent removal and replacement but notes improvement after the last ERCP.    He underwent hernia repair in November 2024, complicated by bowel issues. He experiences a persistent numb sensation in the area, attributed to nerve damage from the surgery.    His current medications include tacrolimus 1.5 mg in the morning and 1 mg at night, amlodipine 10 mg daily, carvedilol 12.5 mg twice a day, and chlorthalidone 25 mg daily for blood pressure management. For diabetes, he takes 27 units of long-acting insulin daily and uses a sliding scale for quick-acting insulin. He is also on aspirin, atorvastatin, and duloxetine, initially prescribed for mood issues.    He previously tried Ozempic and Mounjaro for diabetes management but discontinued them due to severe diarrhea, which resolved after stopping these medications.       - Interval events  - Hospitalizations    Current IS  HTN - amlodipine 10 mg daily, carvedilol 12.5 mg twice daily, and chlorthalidone 25 mg daily   DM -     Labs notable for stable elevation in ALP to 250 range    CKD with Cr 1.5 - seeing nephrology    - Ascites  - HE  - Bleeding  - ECOG PS  - Alcohol and tobacco    Objective   Physical Exam   Vital Signs: There were no vitals taken for this visit.  Constitutional: He is in no apparent distress  Eyes: Anicteric sclerae  Cardiovascular: No peripheral edema  Gastrointestinal: Soft, nontender abdomen without hepatosplenomegaly, hernias, or masses  Neurologic: Awake, alert, and oriented to person, place, and time with normal speech and no asterixis

## 2024-01-27 ENCOUNTER — Ambulatory Visit: Admit: 2024-01-27 | Discharge: 2024-01-27 | Payer: Medicare (Managed Care)

## 2024-01-27 DIAGNOSIS — Z944 Liver transplant status: Principal | ICD-10-CM

## 2024-01-27 DIAGNOSIS — Z5181 Encounter for therapeutic drug level monitoring: Principal | ICD-10-CM

## 2024-01-27 LAB — CBC W/ AUTO DIFF
BASOPHILS ABSOLUTE COUNT: 0 10*9/L (ref 0.0–0.1)
BASOPHILS RELATIVE PERCENT: 0.5 %
EOSINOPHILS ABSOLUTE COUNT: 0.2 10*9/L (ref 0.0–0.5)
EOSINOPHILS RELATIVE PERCENT: 2.7 %
HEMATOCRIT: 42.2 % (ref 39.0–48.0)
HEMOGLOBIN: 14.1 g/dL (ref 12.9–16.5)
LYMPHOCYTES ABSOLUTE COUNT: 2 10*9/L (ref 1.1–3.6)
LYMPHOCYTES RELATIVE PERCENT: 24.1 %
MEAN CORPUSCULAR HEMOGLOBIN CONC: 33.3 g/dL (ref 32.0–36.0)
MEAN CORPUSCULAR HEMOGLOBIN: 27.4 pg (ref 25.9–32.4)
MEAN CORPUSCULAR VOLUME: 82.2 fL (ref 77.6–95.7)
MEAN PLATELET VOLUME: 9.6 fL (ref 6.8–10.7)
MONOCYTES ABSOLUTE COUNT: 0.8 10*9/L (ref 0.3–0.8)
MONOCYTES RELATIVE PERCENT: 10 %
NEUTROPHILS ABSOLUTE COUNT: 5.3 10*9/L (ref 1.8–7.8)
NEUTROPHILS RELATIVE PERCENT: 62.7 %
PLATELET COUNT: 179 10*9/L (ref 150–450)
RED BLOOD CELL COUNT: 5.13 10*12/L (ref 4.26–5.60)
RED CELL DISTRIBUTION WIDTH: 15.5 % — ABNORMAL HIGH (ref 12.2–15.2)
WBC ADJUSTED: 8.4 10*9/L (ref 3.6–11.2)

## 2024-01-27 LAB — COMPREHENSIVE METABOLIC PANEL
ALBUMIN: 3.7 g/dL (ref 3.4–5.0)
ALKALINE PHOSPHATASE: 212 U/L — ABNORMAL HIGH (ref 46–116)
ALT (SGPT): <7 U/L — ABNORMAL LOW (ref 10–49)
ANION GAP: 6 mmol/L (ref 5–14)
AST (SGOT): 9 U/L (ref <=34)
BILIRUBIN TOTAL: 0.5 mg/dL (ref 0.3–1.2)
BLOOD UREA NITROGEN: 22 mg/dL (ref 9–23)
BUN / CREAT RATIO: 13
CALCIUM: 9.6 mg/dL (ref 8.7–10.4)
CHLORIDE: 102 mmol/L (ref 98–107)
CO2: 29 mmol/L (ref 20.0–31.0)
CREATININE: 1.66 mg/dL — ABNORMAL HIGH (ref 0.73–1.18)
EGFR CKD-EPI (2021) MALE: 46 mL/min/1.73m2 — ABNORMAL LOW (ref >=60)
GLUCOSE RANDOM: 357 mg/dL — ABNORMAL HIGH (ref 70–179)
POTASSIUM: 5.6 mmol/L — ABNORMAL HIGH (ref 3.4–4.8)
PROTEIN TOTAL: 7.3 g/dL (ref 5.7–8.2)
SODIUM: 137 mmol/L (ref 135–145)

## 2024-01-27 LAB — MAGNESIUM: MAGNESIUM: 1.8 mg/dL (ref 1.6–2.6)

## 2024-01-27 LAB — GAMMA GT: GAMMA GLUTAMYL TRANSFERASE: 34 U/L (ref 0–73)

## 2024-01-27 LAB — BILIRUBIN, DIRECT: BILIRUBIN DIRECT: 0.2 mg/dL (ref 0.00–0.30)

## 2024-01-27 LAB — PHOSPHORUS: PHOSPHORUS: 4.1 mg/dL (ref 2.4–5.1)

## 2024-01-27 LAB — TACROLIMUS LEVEL: TACROLIMUS BLOOD: 2.3 ng/mL

## 2024-01-27 NOTE — Unmapped (Addendum)
 Reviewed elevated potassium with Leita Brock PharmD. Recommendation was to not treat. Called pt and went over dietary education. Pt admits to eating a lot of potatoes and sweet potatoes which he will cut down on. Pt will repeat labs next week as recommended by Dr. Erick. Submitted one time order for next week labs.

## 2024-01-28 DIAGNOSIS — Z796 Long-term use of immunosuppressant medication: Principal | ICD-10-CM

## 2024-01-28 DIAGNOSIS — Z944 Liver transplant status: Principal | ICD-10-CM

## 2024-01-28 MED ORDER — TACROLIMUS 0.5 MG CAPSULE, IMMEDIATE-RELEASE
ORAL_CAPSULE | Freq: Two times a day (BID) | ORAL | 11 refills | 30.00000 days | Status: CP
Start: 2024-01-28 — End: ?

## 2024-01-28 MED ORDER — TACROLIMUS 1 MG CAPSULE, IMMEDIATE-RELEASE
ORAL_CAPSULE | Freq: Two times a day (BID) | ORAL | 11 refills | 30.00000 days | Status: CP
Start: 2024-01-28 — End: ?
  Filled 2024-02-26: qty 60, 30d supply, fill #0

## 2024-01-28 NOTE — Unmapped (Addendum)
 [facility] Pharmacist has reviewed a new prescription for tacrolimus that indicates a dose increase.  Patient was counseled on this dosage change by EL- see epic note from 6/18.  Next refill call date adjusted if necessary.      Clinical Assessment Needed For: Dose Change  Medication: Tacrolimus 0.5 mg capsule   Last Fill Date/Day Supply: 12/04/23 / 30  Copay $9.04/ 90   Was previous dose already scheduled to fill: No    Notes to Pharmacist: Increase

## 2024-01-28 NOTE — Unmapped (Signed)
 Reviewed patient's tac level of 2.3 (goal 4-6) with PharmD Chargualaf who order patient to increase dosing from 1.5/1 to 1.5 BID.  Call placed to patient to relay dose increase - he verbalized agreement. He will repeat labs next week.

## 2024-02-02 DIAGNOSIS — Z944 Liver transplant status: Principal | ICD-10-CM

## 2024-02-02 DIAGNOSIS — I10 Essential (primary) hypertension: Principal | ICD-10-CM

## 2024-02-02 DIAGNOSIS — I159 Secondary hypertension, unspecified: Principal | ICD-10-CM

## 2024-02-02 DIAGNOSIS — E612 Magnesium deficiency: Principal | ICD-10-CM

## 2024-02-02 DIAGNOSIS — Z5181 Encounter for therapeutic drug level monitoring: Principal | ICD-10-CM

## 2024-02-02 MED ORDER — CARVEDILOL 6.25 MG TABLET
ORAL_TABLET | ORAL | 0 refills | 0.00000 days
Start: 2024-02-02 — End: ?

## 2024-02-02 MED ORDER — LISINOPRIL 2.5 MG TABLET
ORAL_TABLET | Freq: Every day | ORAL | 0 refills | 0.00000 days
Start: 2024-02-02 — End: ?

## 2024-02-02 NOTE — Unmapped (Signed)
 Pt request for RX Refill

## 2024-02-03 MED ORDER — LISINOPRIL 2.5 MG TABLET
ORAL_TABLET | Freq: Every day | ORAL | 0 refills | 90.00000 days
Start: 2024-02-03 — End: ?

## 2024-02-03 MED ORDER — CARVEDILOL 6.25 MG TABLET
ORAL_TABLET | ORAL | 0 refills | 0.00000 days
Start: 2024-02-03 — End: ?

## 2024-02-03 NOTE — Unmapped (Signed)
 Managed by PCP

## 2024-02-04 MED FILL — AMLODIPINE 10 MG TABLET: ORAL | 90 days supply | Qty: 90 | Fill #0

## 2024-02-05 NOTE — Unmapped (Signed)
 [facility] Refill Coordination Note    Specialty Medication(s) to be Shipped:   Transplant: tacrolimus  1mg  and tacrolimus  0.5mg     Other medication(s) to be shipped: No additional medications requested for fill at this time     Matthew Key, DOB: [birth year]  Phone: [phone] (home) [phone] (work)      All above HIPAA information was verified with patient.     Was a Nurse, learning disability used for this call? No    Completed refill call assessment today to schedule patient's medication shipment from the [facility]  [phone]).  All relevant notes have been reviewed.     Specialty medication(s) and dose(s) confirmed: Regimen is correct and unchanged.   Changes to medications: Matthew Key reports no changes at this time.  Changes to insurance: No  New side effects reported not previously addressed with a pharmacist or physician: None reported  Questions for the pharmacist: No    Confirmed patient received a Conservation officer, historic buildings and a Surveyor, mining with first shipment. The patient will receive a drug information handout for each medication shipped and additional FDA Medication Guides as required.       DISEASE/MEDICATION-SPECIFIC INFORMATION        N/A    SPECIALTY MEDICATION ADHERENCE     Medication Adherence    Patient reported X missed doses in the last month: 0  Specialty Medication: tacrolimus  0.5 MG capsule (PROGRAF )  Patient is on additional specialty medications: Yes  Additional Specialty Medications: tacrolimus  1 MG capsule (PROGRAF )  Patient Reported Additional Medication X Missed Doses in the Last Month: 0  Patient is on more than two specialty medications: No  Any gaps in refill history greater than 2 weeks in the last 3 months: no  Demonstrates understanding of importance of adherence: yes  Informant: patient  Confirmed plan for next specialty medication refill: delivery by pharmacy  Refills needed for supportive medications: not needed          Refill Coordination    Has the Patients' Contact Information Changed: No  Is the Shipping Address Different: No         Were doses missed due to medication being on hold? No    tacrolimus  1 mg: 14 days of medicine on hand   tacrolimus  0.5 mg: 14 days of medicine on hand       REFERRAL TO PHARMACIST     Referral to the pharmacist: Not needed      [facility]     Shipping address confirmed in Epic.     Cost and Payment: Patient has a copay of $7.01. They are aware and have authorized the pharmacy to charge the credit card on file.    Delivery Scheduled: Yes, Expected medication delivery date: 02/12/24.     Medication will be delivered via UPS to the prescription address in Epic WAM.    Matthew Key   [facility]  Specialty Technician

## 2024-02-09 DIAGNOSIS — Z944 Liver transplant status: Principal | ICD-10-CM

## 2024-02-09 DIAGNOSIS — Z5181 Encounter for therapeutic drug level monitoring: Principal | ICD-10-CM

## 2024-02-09 DIAGNOSIS — E612 Magnesium deficiency: Principal | ICD-10-CM

## 2024-02-09 NOTE — Unmapped (Signed)
trf

## 2024-02-11 NOTE — Unmapped (Signed)
 Matthew Key 's Tacrolimus   shipment will be delayed as a result of credit card ending in [id number] declined     I have reached out to the patient  at [phone] and communicated the delay. We will wait for a call back from the patient to reschedule the delivery.  We have not confirmed the new delivery date.

## 2024-02-12 ENCOUNTER — Emergency Department (HOSPITAL_COMMUNITY)
Admission: EM | Admit: 2024-02-12 | Discharge: 2024-02-13 | Disposition: A | Discharge status: Home / Self Care | Attending: Emergency Medicine | Admitting: Emergency Medicine

## 2024-02-12 ENCOUNTER — Encounter (HOSPITAL_COMMUNITY): Payer: Self-pay | Admitting: Emergency Medicine

## 2024-02-12 ENCOUNTER — Other Ambulatory Visit: Payer: Self-pay

## 2024-02-12 DIAGNOSIS — E876 Hypokalemia: Secondary | ICD-10-CM | POA: Diagnosis not present

## 2024-02-12 DIAGNOSIS — R109 Unspecified abdominal pain: Secondary | ICD-10-CM | POA: Insufficient documentation

## 2024-02-12 DIAGNOSIS — Z8546 Personal history of malignant neoplasm of prostate: Secondary | ICD-10-CM | POA: Insufficient documentation

## 2024-02-12 DIAGNOSIS — Z794 Long term (current) use of insulin: Secondary | ICD-10-CM | POA: Diagnosis not present

## 2024-02-12 DIAGNOSIS — M545 Low back pain, unspecified: Secondary | ICD-10-CM | POA: Diagnosis present

## 2024-02-12 DIAGNOSIS — R319 Hematuria, unspecified: Secondary | ICD-10-CM | POA: Diagnosis not present

## 2024-02-12 NOTE — ED Triage Notes (Signed)
 Pt with c/o painful urination and blood in urination. Pt with hx of prostate cancer.

## 2024-02-13 ENCOUNTER — Emergency Department (HOSPITAL_COMMUNITY)

## 2024-02-13 LAB — CBC WITH DIFFERENTIAL/PLATELET
Abs Immature Granulocytes: 0.01 K/uL (ref 0.00–0.07)
Basophils Absolute: 0 K/uL (ref 0.0–0.1)
Basophils Relative: 0 %
Eosinophils Absolute: 0.2 K/uL (ref 0.0–0.5)
Eosinophils Relative: 2 %
HCT: 39.2 % (ref 39.0–52.0)
Hemoglobin: 13.2 g/dL (ref 13.0–17.0)
Immature Granulocytes: 0 %
Lymphocytes Relative: 38 %
Lymphs Abs: 2.9 K/uL (ref 0.7–4.0)
MCH: 32 pg (ref 26.0–34.0)
MCHC: 33.7 g/dL (ref 30.0–36.0)
MCV: 94.9 fL (ref 80.0–100.0)
Monocytes Absolute: 0.6 K/uL (ref 0.1–1.0)
Monocytes Relative: 8 %
Neutro Abs: 3.9 K/uL (ref 1.7–7.7)
Neutrophils Relative %: 52 %
Platelets: 233 K/uL (ref 150–400)
RBC: 4.13 MIL/uL — ABNORMAL LOW (ref 4.22–5.81)
RDW: 14.2 % (ref 11.5–15.5)
WBC: 7.6 K/uL (ref 4.0–10.5)
nRBC: 0 % (ref 0.0–0.2)

## 2024-02-13 LAB — BASIC METABOLIC PANEL WITH GFR
Anion gap: 11 (ref 5–15)
BUN: 24 mg/dL — ABNORMAL HIGH (ref 8–23)
CO2: 28 mmol/L (ref 22–32)
Calcium: 9.1 mg/dL (ref 8.9–10.3)
Chloride: 101 mmol/L (ref 98–111)
Creatinine, Ser: 1.22 mg/dL (ref 0.61–1.24)
GFR, Estimated: >60 mL/min (ref >60)
Glucose, Bld: 113 mg/dL — ABNORMAL HIGH (ref 70–99)
Potassium: 3.1 mmol/L — ABNORMAL LOW (ref 3.5–5.1)
Sodium: 140 mmol/L (ref 135–145)

## 2024-02-13 LAB — URINALYSIS, ROUTINE W REFLEX MICROSCOPIC
Bilirubin Urine: NEGATIVE
Glucose, UA: NEGATIVE mg/dL
Hgb urine dipstick: NEGATIVE
Ketones, ur: NEGATIVE mg/dL
Leukocytes,Ua: NEGATIVE
Nitrite: NEGATIVE
Protein, ur: NEGATIVE mg/dL
Specific Gravity, Urine: 1.024 (ref 1.005–1.030)
pH: 5 (ref 5.0–8.0)

## 2024-02-13 MED ORDER — KETOROLAC TROMETHAMINE 30 MG/ML IJ SOLN
30.0000 mg | Freq: Once | INTRAMUSCULAR | Status: AC
  Administered 2024-02-13: 30 mg via INTRAVENOUS
  Filled 2024-02-13: qty 1

## 2024-02-13 MED ORDER — FENTANYL CITRATE PF 50 MCG/ML IJ SOSY
50.0000 ug | PREFILLED_SYRINGE | Freq: Once | INTRAMUSCULAR | Status: AC
  Administered 2024-02-13: 50 ug via INTRAVENOUS
  Filled 2024-02-13: qty 1

## 2024-02-13 MED ORDER — HYDROCODONE-ACETAMINOPHEN 5-325 MG PO TABS: 1.0000 | ORAL_TABLET | Freq: Four times a day (QID) | ORAL | 0 refills | Status: AC | PRN

## 2024-02-13 MED ORDER — POTASSIUM CHLORIDE CRYS ER 20 MEQ PO TBCR
40.0000 meq | EXTENDED_RELEASE_TABLET | Freq: Once | ORAL | Status: AC
  Administered 2024-02-13: 40 meq via ORAL
  Filled 2024-02-13: qty 2

## 2024-02-13 NOTE — ED Notes (Signed)
 Pt to CT

## 2024-02-13 NOTE — ED Provider Notes (Signed)
 [place] EMERGENCY DEPARTMENT AT [facility]  Provider Note  CSN: [id number] Arrival date & time: 02/12/24 2339  History Chief Complaint  Patient presents with   L Flank pain    William Kemp is a 63 y.o. male with history of prostate cancer, under surveillance, no active treatment, reports 2 days of L flank pain, worse tonight. He thought he saw some blood in his urine earlier today. He reports pain is worse with movement. No falls or injuries. No known mets from prostate cancer. No history of stones.    Home Medications Prior to Admission medications   Medication Sig Start Date End Date Taking? Authorizing Provider  HYDROcodone -acetaminophen  (NORCO/VICODIN) 5-325 MG tablet Take 1 tablet by mouth every 6 (six) hours as needed for severe pain (pain score 7-10). 02/13/24  Yes Roselyn Carlin NOVAK, MD  albuterol  (VENTOLIN  HFA) 108 [phone] Base) MCG/ACT inhaler Inhale into the lungs every 6 (six) hours as needed for wheezing or shortness of breath.    [provider]  allopurinol (ZYLOPRIM) 300 MG tablet Take 300 mg by mouth daily. 06/04/20   [provider]  baclofen (LIORESAL) 10 MG tablet Take 10 mg by mouth 3 (three) times daily. 03/20/20   [provider]  BREO ELLIPTA 100-25 MCG/INH AEPB Inhale 1 puff into the lungs daily. 04/14/20   [provider]  carvedilol (COREG) 25 MG tablet  02/17/20   [provider]  chlorthalidone (HYGROTON) 25 MG tablet Take 25 mg by mouth daily. 05/15/20   [provider]  ELIQUIS 5 MG TABS tablet Take 5 mg by mouth 2 (two) times daily. 03/28/20   [provider]  etodolac (LODINE) 400 MG tablet Take 400 mg by mouth 2 (two) times daily.    [provider]  gabapentin (NEURONTIN) 600 MG tablet Take 600 mg by mouth 3 (three) times daily. 05/25/20   [provider]  hydrALAZINE (APRESOLINE) 100 MG tablet Take 100 mg by mouth 2 (two) times daily. 06/11/20   [provider]   LINZESS 145 MCG CAPS capsule Take 145 mcg by mouth daily. 05/15/20   [provider]  losartan (COZAAR) 100 MG tablet Take by mouth. 01/02/18   [provider]  OZEMPIC, 0.25 OR 0.5 MG/DOSE, 2 MG/1.5ML SOPN Inject into the skin. 04/14/20   [provider]  potassium chloride  (KLOR-CON ) 8 MEQ tablet Take 8 mEq by mouth daily. 05/22/20   [provider]  predniSONE  (STERAPRED UNI-PAK 21 TAB) 10 MG (21) TBPK tablet 10mg  Tabs, 6 day taper. Use as directed 12/04/20   Roselyn Carlin NOVAK, MD  tadalafil (CIALIS) 20 MG tablet Take by mouth. 05/18/20   [provider]  tamsulosin (FLOMAX) 0.4 MG CAPS capsule Take 0.4 mg by mouth daily. 05/30/20   [provider]     Allergies    Codeine and Tramadol   Review of Systems   Review of Systems Please see HPI for pertinent positives and negatives  Physical Exam BP 123/70   Pulse (!) 49   Temp 97.9 F (36.6 C) (Oral)   Resp 17   Ht 6' (1.829 m)   Wt (!) 148.8 kg   SpO2 100%   BMI 44.48 kg/m   Physical Exam Vitals and nursing note reviewed.  Constitutional:      Appearance: Normal appearance.  HENT:     Head: Normocephalic and atraumatic.     Nose: Nose normal.     Mouth/Throat:     Mouth:  Mucous membranes are moist.  Eyes:     Extraocular Movements: Extraocular movements intact.     Conjunctiva/sclera: Conjunctivae normal.  Cardiovascular:     Rate and Rhythm: Normal rate.  Pulmonary:     Effort: Pulmonary effort is normal.     Breath sounds: Normal breath sounds.  Abdominal:     General: Abdomen is flat.     Palpations: Abdomen is soft.     Tenderness: There is no abdominal tenderness.  Musculoskeletal:        General: No swelling or tenderness. Normal range of motion.     Cervical back: Neck supple.  Skin:    General: Skin is warm and dry.  Neurological:     General: No focal deficit present.     Mental Status: He is alert.  Psychiatric:        Mood and Affect: Mood  normal.     ED Results / Procedures / Treatments   EKG None  Procedures Procedures  Medications Ordered in the ED Medications  potassium chloride  SA (KLOR-CON  M) CR tablet 40 mEq (has no administration in time range)  ketorolac  (TORADOL ) 30 MG/ML injection 30 mg (has no administration in time range)  fentaNYL  (SUBLIMAZE ) injection 50 mcg (50 mcg Intravenous Given 02/13/24 0131)    Initial Impression and Plan  Patient here with flank pain, ?hematuria and history of prostate cancer. No tenderness on exam consider MSK, renal colic or bony mets. UA done in triage is normal. Will check basic labs and send for CT. Pain meds for comfort   ED Course   Clinical Course as of 02/13/24 0257  [place] Feb 13, 2024  0202 CBC is unremarkable. BMP with mild hypokalemia, replete orally here.  [CS]  0215 I personally viewed the images from radiology studies and agree with radiologist interpretation: CT neg for renal stone, hydronephrosis or bony erosions. Will give a dose of toradol  for suspected MSK pain [CS]  0247 Patient reports some improvement in pain, still worsens with movement. Will plan discharge with short course of norco, continue OTC meds at home. Recommend rest, heat and PCP follow up, RTED for any other concerns.   [CS]    Clinical Course User Index [CS] Roselyn Carlin NOVAK, MD     MDM Rules/Calculators/A&P Medical Decision Making Problems Addressed: Acute left-sided low back pain without sciatica: acute illness or injury  Amount and/or Complexity of Data Reviewed Labs: ordered. Decision-making details documented in ED Course. Radiology: ordered and independent interpretation performed. Decision-making details documented in ED Course.  Risk Prescription drug management. Parenteral controlled substances.     Final Clinical Impression(s) / ED Diagnoses Final diagnoses:  Acute left-sided low back pain without sciatica    Rx / DC Orders ED Discharge Orders          Ordered     HYDROcodone -acetaminophen  (NORCO/VICODIN) 5-325 MG tablet  Every 6 hours PRN        02/13/24 0249             Roselyn Carlin NOVAK, MD 02/13/24 [phone]

## 2024-02-13 NOTE — ED Notes (Signed)
 Urinal at bedside.

## 2024-02-16 DIAGNOSIS — Z944 Liver transplant status: Principal | ICD-10-CM

## 2024-02-16 DIAGNOSIS — E612 Magnesium deficiency: Principal | ICD-10-CM

## 2024-02-16 DIAGNOSIS — Z5181 Encounter for therapeutic drug level monitoring: Principal | ICD-10-CM

## 2024-02-18 NOTE — Unmapped (Signed)
 Matthew Key 's Tacrolimus  0.5 mg and 1 mg  shipment will be canceled as a result of credit card ending in [id number] declined     I have reached out to the patient  at [phone] and communicated the delay. We will not reschedule the medication and have removed this/these medication(s) from the work request.  We have canceled this work request.

## 2024-02-23 DIAGNOSIS — Z5181 Encounter for therapeutic drug level monitoring: Principal | ICD-10-CM

## 2024-02-23 DIAGNOSIS — Z944 Liver transplant status: Principal | ICD-10-CM

## 2024-02-23 DIAGNOSIS — E612 Magnesium deficiency: Principal | ICD-10-CM

## 2024-02-24 DIAGNOSIS — I10 Essential (primary) hypertension: Principal | ICD-10-CM

## 2024-02-24 MED ORDER — CARVEDILOL 6.25 MG TABLET
ORAL_TABLET | ORAL | 0 refills | 0.00000 days
Start: 2024-02-24 — End: ?

## 2024-02-25 DIAGNOSIS — Z944 Liver transplant status: Principal | ICD-10-CM

## 2024-02-25 DIAGNOSIS — Z796 Long term current use of immunosuppressive drug: Principal | ICD-10-CM

## 2024-02-25 MED ORDER — CARVEDILOL 6.25 MG TABLET
ORAL_TABLET | ORAL | 0 refills | 0.00000 days
Start: 2024-02-25 — End: ?

## 2024-02-25 MED ORDER — CARVEDILOL 12.5 MG TABLET
ORAL_TABLET | Freq: Two times a day (BID) | ORAL | 3 refills | 90.00000 days | Status: CP
Start: 2024-02-25 — End: 2025-02-24

## 2024-02-25 MED ORDER — ATORVASTATIN 40 MG TABLET
ORAL_TABLET | Freq: Every day | ORAL | 3 refills | 90.00000 days
Start: 2024-02-25 — End: 2025-02-24

## 2024-02-25 MED ORDER — INSULIN ASPART (U-100) 100 UNIT/ML (3 ML) SUBCUTANEOUS PEN
SUBCUTANEOUS | 0 refills | 0.00000 days
Start: 2024-02-25 — End: ?

## 2024-02-25 NOTE — Unmapped (Signed)
 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     Was a Nurse, learning disability used for this call? No    Completed refill call assessment today to schedule patient's medication shipment from the [facility] Delivery Pharm[facility]  [phone]).  All relevant notes have been reviewed.     Specialty medication(s) and dose(s) confirmed: Regimen is correct and unchanged.   Changes to medications: Jasten reports no changes at this time.  Changes to insurance: No  New side effects reported not previously addressed with a pharmacist or physician: None reported  Questions for the pharmacist: No    Confirmed patient received a Conservation officer, historic buildings and a Surveyor, mining with first shipment. The patient will receive a drug information handout for each medication shipped and additional FDA Medication Guides as required.       DISEASE/MEDICATION-SPECIFIC INFORMATION        N/A    SPECIALTY MEDICATION ADHERENCE     Medication Adherence    Patient reported X missed doses in the last month: 0  Specialty Medication: tacrolimus  1 MG capsule (PROGRAF )  Patient is on additional specialty medications: Yes  Additional Specialty Medications: tacrolimus  0.5 MG capsule (PROGRAF )  Patient Reported Additional Medication X Missed Doses in the Last Month: 0  Patient is on more than two specialty medications: No              Were doses missed due to medication being on hold? No    tacrolimus  0.5 MG capsule (PROGRAF ): 10 days of medicine on hand   tacrolimus  1 MG capsule (PROGRAF ): 4 days of medicine on hand       REFERRAL TO PHARMACIST Referral to the pharmacist: Not needed      [facility]     Shipping address confirmed in Epic.     Cost and Payment: Patient has a $0 copay, payment information is not required.    Delivery Scheduled: Yes, Expected medication delivery date: 02/27/24.     Medication will be delivered via UPS to the prescription address in Epic WAM.    Matthew Key   [facility]  Specialty Technician

## 2024-02-25 NOTE — Unmapped (Signed)
 Pt request for RX Refill

## 2024-02-25 NOTE — Unmapped (Signed)
 Ordered, thanks.

## 2024-02-25 NOTE — Unmapped (Signed)
 Req received for carvedilol  refill, placed script to local [facility] per Dr. Darina note.

## 2024-02-26 DIAGNOSIS — Z796 Long term current use of immunosuppressive drug: Principal | ICD-10-CM

## 2024-02-26 DIAGNOSIS — Z944 Liver transplant status: Principal | ICD-10-CM

## 2024-02-26 MED ORDER — ATORVASTATIN 40 MG TABLET
ORAL_TABLET | Freq: Every day | ORAL | 3 refills | 90.00000 days | Status: CP
Start: 2024-02-26 — End: 2025-02-25
  Filled 2024-03-30: qty 90, 90d supply, fill #0

## 2024-02-26 MED FILL — TACROLIMUS 1 MG CAPSULE, IMMEDIATE-RELEASE: ORAL | 30 days supply | Qty: 60 | Fill #0

## 2024-02-26 MED FILL — DULOXETINE 30 MG CAPSULE,DELAYED RELEASE: ORAL | 90 days supply | Qty: 90 | Fill #1

## 2024-02-26 MED FILL — TRESIBA FLEXTOUCH U-100 INSULIN 100 UNIT/ML (3 ML) SUBCUTANEOUS PEN: SUBCUTANEOUS | 100 days supply | Qty: 15 | Fill #1

## 2024-02-26 NOTE — Unmapped (Signed)
 Rec req for Lipitor  refill, placed script to [facility] per Dr. Erick.

## 2024-02-27 LAB — CBC W/ DIFFERENTIAL
BANDED NEUTROPHILS ABSOLUTE COUNT: 0.1 x10E3/uL (ref 0.0–0.1)
BASOPHILS ABSOLUTE COUNT: 0 x10E3/uL (ref 0.0–0.2)
BASOPHILS RELATIVE PERCENT: 0 %
EOSINOPHILS ABSOLUTE COUNT: 0.2 x10E3/uL (ref 0.0–0.4)
EOSINOPHILS RELATIVE PERCENT: 3 %
HEMATOCRIT: 42.9 % (ref 37.5–51.0)
HEMOGLOBIN: 13.2 g/dL (ref 13.0–17.7)
IMMATURE GRANULOCYTES: 1 %
LYMPHOCYTES ABSOLUTE COUNT: 1.8 x10E3/uL (ref 0.7–3.1)
LYMPHOCYTES RELATIVE PERCENT: 25 %
MEAN CORPUSCULAR HEMOGLOBIN CONC: 30.8 g/dL — ABNORMAL LOW (ref 31.5–35.7)
MEAN CORPUSCULAR HEMOGLOBIN: 27.4 pg (ref 26.6–33.0)
MEAN CORPUSCULAR VOLUME: 89 fL (ref 79–97)
MONOCYTES ABSOLUTE COUNT: 0.6 x10E3/uL (ref 0.1–0.9)
MONOCYTES RELATIVE PERCENT: 8 %
NEUTROPHILS ABSOLUTE COUNT: 4.6 x10E3/uL (ref 1.4–7.0)
NEUTROPHILS RELATIVE PERCENT: 63 %
PLATELET COUNT: 204 x10E3/uL (ref 150–450)
RED BLOOD CELL COUNT: 4.82 x10E6/uL (ref 4.14–5.80)
RED CELL DISTRIBUTION WIDTH: 13.7 % (ref 11.6–15.4)
WHITE BLOOD CELL COUNT: 7.3 x10E3/uL (ref 3.4–10.8)

## 2024-02-27 LAB — COMPREHENSIVE METABOLIC PANEL
ALBUMIN: 3.7 g/dL — ABNORMAL LOW (ref 3.9–4.9)
ALKALINE PHOSPHATASE: 175 IU/L — ABNORMAL HIGH (ref 44–121)
ALT (SGPT): 10 IU/L (ref 0–44)
AST (SGOT): 8 IU/L (ref 0–40)
BILIRUBIN TOTAL (MG/DL) IN SER/PLAS: 0.5 mg/dL (ref 0.0–1.2)
BLOOD UREA NITROGEN: 21 mg/dL (ref 8–27)
BUN / CREAT RATIO: 13 (ref 10–24)
CALCIUM: 9.1 mg/dL (ref 8.6–10.2)
CHLORIDE: 106 mmol/L (ref 96–106)
CO2: 20 mmol/L (ref 20–29)
CREATININE: 1.57 mg/dL — ABNORMAL HIGH (ref 0.76–1.27)
EGFR: 49 mL/min/1.73 — ABNORMAL LOW
GLOBULIN, TOTAL: 2.7 g/dL (ref 1.5–4.5)
GLUCOSE: 157 mg/dL — ABNORMAL HIGH (ref 70–99)
POTASSIUM: 5.3 mmol/L — ABNORMAL HIGH (ref 3.5–5.2)
SODIUM: 139 mmol/L (ref 134–144)
TOTAL PROTEIN: 6.4 g/dL (ref 6.0–8.5)

## 2024-02-27 LAB — MAGNESIUM: MAGNESIUM: 1.6 mg/dL (ref 1.6–2.3)

## 2024-02-27 LAB — BILIRUBIN, DIRECT: BILIRUBIN DIRECT: 0.16 mg/dL (ref 0.00–0.40)

## 2024-02-27 LAB — PHOSPHORUS: PHOSPHORUS, SERUM: 4.4 mg/dL — ABNORMAL HIGH (ref 2.8–4.1)

## 2024-02-27 LAB — GAMMA GT: GAMMA GLUTAMYL TRANSFERASE: 26 IU/L (ref 0–65)

## 2024-02-28 LAB — TACROLIMUS LEVEL: TACROLIMUS BLOOD: 6 ng/mL (ref 5.0–20.0)

## 2024-02-28 NOTE — Unmapped (Signed)
 Lab results from 02/26/24 were reviewed. No changes recommended at this time. Pt to repeat labs in Oct.    Messaged Pt in [place].

## 2024-02-29 ENCOUNTER — Inpatient Hospital Stay: Admit: 2024-02-29 | Discharge: 2024-02-29 | Payer: Medicare (Managed Care)

## 2024-02-29 MED ADMIN — gadopiclenol (ELUCIREM,VUEWAY) injection 10 mL: 10 mL | INTRAVENOUS | @ 13:00:00 | Stop: 2024-02-29

## 2024-03-01 DIAGNOSIS — E612 Magnesium deficiency: Principal | ICD-10-CM

## 2024-03-01 DIAGNOSIS — Z5181 Encounter for therapeutic drug level monitoring: Principal | ICD-10-CM

## 2024-03-01 DIAGNOSIS — Z944 Liver transplant status: Principal | ICD-10-CM

## 2024-03-02 NOTE — Unmapped (Signed)
 MRI/MRCP results from 02/29/24 reviewed with Dr. Erick, who stated that no follow up is needed at this time, repeat annually.    Messaged Pt in MyChart.

## 2024-03-02 NOTE — Unmapped (Signed)
 error

## 2024-03-08 DIAGNOSIS — Z944 Liver transplant status: Principal | ICD-10-CM

## 2024-03-08 DIAGNOSIS — E612 Magnesium deficiency: Principal | ICD-10-CM

## 2024-03-08 DIAGNOSIS — Z5181 Encounter for therapeutic drug level monitoring: Principal | ICD-10-CM

## 2024-03-15 DIAGNOSIS — Z5181 Encounter for therapeutic drug level monitoring: Principal | ICD-10-CM

## 2024-03-15 DIAGNOSIS — Z944 Liver transplant status: Principal | ICD-10-CM

## 2024-03-15 DIAGNOSIS — E612 Magnesium deficiency: Principal | ICD-10-CM

## 2024-03-22 DIAGNOSIS — Z5181 Encounter for therapeutic drug level monitoring: Principal | ICD-10-CM

## 2024-03-22 DIAGNOSIS — E612 Magnesium deficiency: Principal | ICD-10-CM

## 2024-03-22 DIAGNOSIS — Z944 Liver transplant status: Principal | ICD-10-CM

## 2024-03-29 DIAGNOSIS — E612 Magnesium deficiency: Principal | ICD-10-CM

## 2024-03-29 DIAGNOSIS — Z944 Liver transplant status: Principal | ICD-10-CM

## 2024-03-29 DIAGNOSIS — Z5181 Encounter for therapeutic drug level monitoring: Principal | ICD-10-CM

## 2024-03-29 MED ORDER — INSULIN ASPART (U-100) 100 UNIT/ML (3 ML) SUBCUTANEOUS PEN
SUBCUTANEOUS | 0 refills | 0.00000 days
Start: 2024-03-29 — End: ?

## 2024-03-29 NOTE — Unmapped (Addendum)
 This patient is on a tacrolimus  a narrow therapeutic index (NTI) medication. The NDC used for the last fill on 7/17 is currently unavailable due to market withdrawal. I have received approval from the patient's prescriber that we may substitute with an available NDC. I have informed the patient of this change and instructed them to follow-up with their provider as needed.      Approximate time spent: 5-10 minutes    Ronal FORBES Reusing, PharmD, Clinical Specialty Pharmacist  [facility]        [facility] Delivery Pharm[facility] Refill Coordination Note    Specialty Medication(s) to be Shipped:   Transplant: tacrolimus  1mg  and tacrolimus  0.5mg     Other medication(s) to be shipped: atorvastatin     Specialty Medications not needed at this time: N/A     Matthew Key, DOB: 1961/01/10  Phone: [phone] (home) [phone] (work)      All above HIPAA information was verified with patient.     Was a Nurse, learning disability used for this call? No    Completed refill call assessment today to schedule patient's medication shipment from the [facility] and Home Delivery Pharmacy  [phone]).  All relevant notes have been reviewed.     Specialty medication(s) and dose(s) confirmed: Regimen is correct and unchanged.   Changes to medications: Jovani reports no changes at this time.  Changes to insurance: No  New side effects reported not previously addressed with a pharmacist or physician: None reported  Questions for the pharmacist: No    Confirmed patient received a Conservation officer, historic buildings and a Surveyor, mining with first shipment. The patient will receive a drug information handout for each medication shipped and additional FDA Medication Guides as required.       DISEASE/MEDICATION-SPECIFIC INFORMATION        N/A    SPECIALTY MEDICATION ADHERENCE     Medication Adherence    Patient reported X missed doses in the last month: 0  Specialty Medication: tacrolimus  0.5 MG capsule (PROGRAF )  Patient is on additional specialty medications: Yes  Additional Specialty Medications: tacrolimus  1 MG capsule (PROGRAF )  Patient Reported Additional Medication X Missed Doses in the Last Month: 0  Patient is on more than two specialty medications: No  Any gaps in refill history greater than 2 weeks in the last 3 months: no  Demonstrates understanding of importance of adherence: yes  Informant: patient          Refill Coordination    Has the Patients' Contact Information Changed: No  Is the Shipping Address Different: No         Were doses missed due to medication being on hold? No    tacrolimus  1  mg: 0 doses of medicine on hand   tacrolimus  0.5 mg: 4 days of medicine on hand     REFERRAL TO PHARMACIST     Referral to the pharmacist: Not needed      [facility]     Shipping address confirmed in Epic.     Cost and Payment: Patient has a copay of $7.01. They are aware and have authorized the pharmacy to charge the credit card on file.    Delivery Scheduled: Yes, Expected medication delivery date: 03/31/24.     Medication will be delivered via UPS to the prescription address in Epic WAM.    Suzen Blood   [facility] Delivery Pharm[facility]  Specialty Technician

## 2024-03-30 MED FILL — TACROLIMUS 1 MG CAPSULE, IMMEDIATE-RELEASE: ORAL | 30 days supply | Qty: 60 | Fill #1

## 2024-03-30 MED FILL — TACROLIMUS 0.5 MG CAPSULE, IMMEDIATE-RELEASE: ORAL | 30 days supply | Qty: 60 | Fill #1

## 2024-04-05 DIAGNOSIS — Z5181 Encounter for therapeutic drug level monitoring: Principal | ICD-10-CM

## 2024-04-05 DIAGNOSIS — E612 Magnesium deficiency: Principal | ICD-10-CM

## 2024-04-05 DIAGNOSIS — Z944 Liver transplant status: Principal | ICD-10-CM

## 2024-04-12 DIAGNOSIS — Z5181 Encounter for therapeutic drug level monitoring: Principal | ICD-10-CM

## 2024-04-12 DIAGNOSIS — E612 Magnesium deficiency: Principal | ICD-10-CM

## 2024-04-12 DIAGNOSIS — Z944 Liver transplant status: Principal | ICD-10-CM

## 2024-04-19 DIAGNOSIS — Z944 Liver transplant status: Principal | ICD-10-CM

## 2024-04-19 DIAGNOSIS — Z5181 Encounter for therapeutic drug level monitoring: Principal | ICD-10-CM

## 2024-04-19 DIAGNOSIS — E612 Magnesium deficiency: Principal | ICD-10-CM

## 2024-04-26 DIAGNOSIS — E612 Magnesium deficiency: Principal | ICD-10-CM

## 2024-04-26 DIAGNOSIS — Z5181 Encounter for therapeutic drug level monitoring: Principal | ICD-10-CM

## 2024-04-26 DIAGNOSIS — Z944 Liver transplant status: Principal | ICD-10-CM

## 2024-04-29 NOTE — Unmapped (Signed)
 [facility] Specialty and Home Delivery Pharmacy Refill Coordination Note    Specialty Medication(s) to be Shipped:   Transplant: tacrolimus  1mg     Other medication(s) to be shipped: No additional medications requested for fill at this time    Specialty Medications not needed at this time: Transplant: tacrolimus  0.5mg . Currently has 30 day supply.     Matthew Key, [name]: 02/01/1961  Phone: [phone] (home) [phone] (work)      All above HIPAA information was verified with patient.     Was a Nurse, learning disability used for this call? No    Completed refill call assessment today to schedule patient's medication shipment from the [facility] and Home Delivery Pharmacy  [phone]).  All relevant notes have been reviewed.     Specialty medication(s) and dose(s) confirmed: Regimen is correct and unchanged.   Changes to medications: Matthew Key reports no changes at this time.  Changes to insurance: No  New side effects reported not previously addressed with a pharmacist or physician: None reported  Questions for the pharmacist: No    Confirmed patient received a Conservation officer, historic buildings and a Surveyor, mining with first shipment. The patient will receive a drug information handout for each medication shipped and additional FDA Medication Guides as required.       DISEASE/MEDICATION-SPECIFIC INFORMATION        N/A    SPECIALTY MEDICATION ADHERENCE     Medication Adherence    Patient reported X missed doses in the last month: 0  Specialty Medication: tacrolimus  1 MG capsule (PROGRAF )  Patient is on additional specialty medications: No  Patient is on more than two specialty medications: No  Any gaps in refill history greater than 2 weeks in the last 3 months: no  Demonstrates understanding of importance of adherence: yes  Informant: patient  Confirmed plan for next specialty medication refill: delivery by pharmacy  Refills needed for supportive medications: not needed          Refill Coordination    Has the Patients' Contact Information Changed: No  Is the Shipping Address Different: No         Were doses missed due to medication being on hold? No    tacrolimus  1  mg: 4 days of medicine on hand       REFERRAL TO PHARMACIST     Referral to the pharmacist: Not needed      [facility]     Shipping address confirmed in Epic.     Cost and Payment: Patient has a copay of $3.96. They are aware and have authorized the pharmacy to charge the credit card on file.    Delivery Scheduled: Yes, Expected medication delivery date: 05/03/24.     Medication will be delivered via UPS to the prescription address in Epic WAM.    Matthew Key   [facility] and Home Delivery Pharm[facility]  Specialty Technician

## 2024-04-30 MED FILL — TACROLIMUS 1 MG CAPSULE, IMMEDIATE-RELEASE: ORAL | 30 days supply | Qty: 60 | Fill #2

## 2024-05-03 DIAGNOSIS — Z5181 Encounter for therapeutic drug level monitoring: Principal | ICD-10-CM

## 2024-05-03 DIAGNOSIS — Z944 Liver transplant status: Principal | ICD-10-CM

## 2024-05-03 DIAGNOSIS — E612 Magnesium deficiency: Principal | ICD-10-CM

## 2024-05-10 DIAGNOSIS — Z5181 Encounter for therapeutic drug level monitoring: Principal | ICD-10-CM

## 2024-05-10 DIAGNOSIS — Z944 Liver transplant status: Principal | ICD-10-CM

## 2024-05-10 DIAGNOSIS — E612 Magnesium deficiency: Principal | ICD-10-CM

## 2024-05-17 ENCOUNTER — Encounter: Admit: 2024-05-17 | Discharge: 2024-05-17 | Payer: Medicare (Managed Care) | Attending: Nephrology | Primary: Nephrology

## 2024-05-17 DIAGNOSIS — I10 Essential (primary) hypertension: Principal | ICD-10-CM

## 2024-05-17 DIAGNOSIS — E119 Type 2 diabetes mellitus without complications: Principal | ICD-10-CM

## 2024-05-17 DIAGNOSIS — Z5181 Encounter for therapeutic drug level monitoring: Principal | ICD-10-CM

## 2024-05-17 DIAGNOSIS — Z944 Liver transplant status: Principal | ICD-10-CM

## 2024-05-17 DIAGNOSIS — Z794 Long term (current) use of insulin: Principal | ICD-10-CM

## 2024-05-17 DIAGNOSIS — E612 Magnesium deficiency: Principal | ICD-10-CM

## 2024-05-17 DIAGNOSIS — N189 Chronic kidney disease, unspecified: Principal | ICD-10-CM

## 2024-05-17 LAB — RENAL FUNCTION PANEL
ALBUMIN: 3.8 g/dL (ref 3.4–5.0)
ANION GAP: 15 mmol/L — ABNORMAL HIGH (ref 5–14)
BLOOD UREA NITROGEN: 17 mg/dL (ref 9–23)
BUN / CREAT RATIO: 10
CALCIUM: 9.6 mg/dL (ref 8.7–10.4)
CHLORIDE: 98 mmol/L (ref 98–107)
CO2: 26 mmol/L (ref 20.0–31.0)
CREATININE: 1.7 mg/dL — ABNORMAL HIGH (ref 0.73–1.18)
EGFR CKD-EPI (2021) MALE: 44 mL/min/1.73m2 — ABNORMAL LOW (ref >=60)
GLUCOSE RANDOM: 217 mg/dL — ABNORMAL HIGH (ref 70–179)
PHOSPHORUS: 3.6 mg/dL (ref 2.4–5.1)
POTASSIUM: 4.2 mmol/L (ref 3.4–4.8)
SODIUM: 139 mmol/L (ref 135–145)

## 2024-05-17 LAB — ALBUMIN / CREATININE URINE RATIO
ALBUMIN QUANT URINE: 2.6 mg/dL
ALBUMIN/CREATININE RATIO: 32.2 ug/mg — ABNORMAL HIGH (ref 0.0–30.0)
CREATININE, URINE: 80.8 mg/dL

## 2024-05-17 NOTE — Unmapped (Signed)
Patient labs drawn in room prior to leaving Nephrology clinic

## 2024-05-24 DIAGNOSIS — Z5181 Encounter for therapeutic drug level monitoring: Principal | ICD-10-CM

## 2024-05-24 DIAGNOSIS — N183 Stage 3 chronic kidney disease, unspecified whether stage 3a or 3b CKD (CMS-HCC): Principal | ICD-10-CM

## 2024-05-24 DIAGNOSIS — E119 Type 2 diabetes mellitus without complications: Principal | ICD-10-CM

## 2024-05-24 DIAGNOSIS — Z794 Long term (current) use of insulin: Principal | ICD-10-CM

## 2024-05-24 DIAGNOSIS — Z8639 Personal history of other endocrine, nutritional and metabolic disease: Principal | ICD-10-CM

## 2024-05-24 DIAGNOSIS — Z944 Liver transplant status: Principal | ICD-10-CM

## 2024-05-24 DIAGNOSIS — I10 Essential (primary) hypertension: Principal | ICD-10-CM

## 2024-05-24 DIAGNOSIS — E612 Magnesium deficiency: Principal | ICD-10-CM

## 2024-05-24 MED ORDER — TELMISARTAN 20 MG TABLET
ORAL_TABLET | Freq: Every day | ORAL | 3 refills | 100.00000 days | Status: CP
Start: 2024-05-24 — End: 2025-06-28

## 2024-05-24 MED ORDER — PATIROMER CALCIUM SORBITEX 8.4 GRAM ORAL POWDER PACKET
PACK | Freq: Every day | ORAL | 3 refills | 90.00000 days | Status: CP
Start: 2024-05-24 — End: ?

## 2024-05-24 NOTE — Unmapped (Signed)
 Referring Provider: Laurence Eleanor Key*     PCP:  Matthew Eleanor Norris, MD    05/24/2024    Chief Complaint: follow-up for CKD    Background:  [name] is a 63 y.o. male with a h/o DM, HTN, and liver transplant in 01/2022 who was referred by Eleanor Key Matthew,* to establish care for CKD and first seen in 10/2023.     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 His current immunosuppressive regimen is: tacrolimus  1.5mg  in am, 1mg  in pm (previously on everolimus ).     He has a history of diabetes, diagnosed over five years ago, and uses a continuous glucose monitor. He was on Ozempic  but discontinued it due to severe diarrhea. He currently takes Tresiba  15 units daily and NovoLog . He was previously on metformin but stopped before his liver transplant.     Reports high blood pressure for at least five years.     Reports adrenal insufficiency before his liver transplant but resolved posttransplant.    PCP is Dr. Norleen Key at [facility] in [place].  He has followed with endocrinology in the past but has not been seen since last year, is planning to make an appointment soon.    HPI:     History of Present Illness  Matthew Key is a 63 year old male with chronic kidney disease who presents for nephrology follow-up.    Chronic kidney disease  - Stable renal function with creatinine levels between 1.6 to 1.8 mg/dL  - Estimated glomerular filtration rate (eGFR) consistent with 40-50% kidney function  - Hyperkalemia managed with potassium-binding powder and dietary potassium restriction    Hypertension  - Blood pressure controlled with amlodipine , chlorthalidone , and carvedilol   - Recent blood pressure readings of 132 mmHg and 137 mmHg    Diabetes mellitus  - Glycemic control maintained with Tresiba  and sliding scale insulin   - Continuous glucose monitor readings averaging 159 mg/dL    Post-liver transplant status  - Stable liver function on tacrolimus , currently dosed at 1.5 mg in the morning and 1 mg at night        PAST MEDICAL HISTORY:  Past Medical History:   Diagnosis Date    AKI (acute kidney injury) 12/14/2020    Anxiety 10/22/2013    Arthritis     Cervical radiculopathy 12/03/2016    Chronic pain disorder     Lower back    Cirrhosis    (CMS-HCC)     Dental abscess 10/2020    Duodenal ulcer 12/01/2017    GERD (gastroesophageal reflux disease)     History of transfusion     Hyperlipidemia 10/22/2013    Hypertension     under control with meds and weight loss    Hypertension associated with diabetes    (CMS-HCC) 09/25/2021    Hypotension 01/29/2022    Liver disease     Liver transplant status    (CMS-HCC) 02/06/2022    Sleep apnea, obstructive     Have machine    Stroke    (CMS-HCC)     mild stroke  Type 2 diabetes mellitus with diabetic neuropathy, with long-term current use of insulin     (CMS-HCC) 06/09/2014       ALLERGIES  Bee venom protein (honey bee) and Venom-honey bee    SOCIAL HISTORY  Social History     Socioeconomic History    Marital status: Married   Tobacco Use    Smoking status: Never    Smokeless tobacco: Never   Vaping Use    Vaping status: Never Used   Substance and Sexual Activity    Alcohol use: Not Currently    Drug use: Not Currently    Sexual activity: Not Currently     Partners: Female     Birth control/protection: Surgical, Bilateral Tubal Ligation     Comment: wife   Other Topics Concern    Exercise Yes    Living Situation Yes     Comment: Married     Social Drivers of Health Financial Resource Strain: Medium Risk (02/03/2024)    Received from [facility]    Overall Financial Resource Strain (CARDIA)     Difficulty of Paying Living Expenses: Somewhat hard   Food Insecurity: Food Insecurity Present (02/03/2024)    [facility]    Hunger Vital Sign     Within the past 12 months, you worried that your food would run out before you got the money to buy more.: Often true     Within the past 12 months, the food you bought just didn't last and you didn't have money to get more.: Often true   Transportation Needs: Unmet Transportation Needs (02/03/2024)    Received from [facility] - Transportation     Lack of Transportation (Medical): No     Lack of Transportation (Non-Medical): Yes   Physical Activity: Insufficiently Active (11/24/2023)    Received from [facility]    Exercise Vital Sign     On average, how many days per week do you engage in moderate to strenuous exercise (like a brisk walk)?: 2 days     On average, how many minutes do you engage in exercise at this level?: 60 min   Stress: Stress Concern Present (11/24/2023)    Received from [facility] - Occupational Stress Questionnaire     Feeling of Stress : To some extent   Social Connections: Socially Integrated (11/24/2023)    Received from [facility]    Social Network     How would you rate your social network (family, work, friends)?: Good participation with social networks   Housing: High Risk (02/03/2024)    Received from [facility] Stability Vital Sign     In the last 12 months, was there a time when you were not able to pay the mortgage or rent on time?: Yes     In the past 12 months, how many times have you moved where you were living?: 0     At any time in the past 12 months, were you homeless or living in a shelter (including now)?: No         FAMILY HISTORY  Family History   Problem Relation Age of Onset    Edema Mother Alzheimer's disease Father     Aortic dissection Brother     Early death Brother     Aneurysm Brother    Reports brother had kidney disease, but unsure of cause. No other known family history of  kidney disease.   Reports mother died from blood clot.     MEDICATIONS:  Current Outpatient Medications   Medication Sig Dispense Refill    acetaminophen  (TYLENOL ) 500 MG tablet Take 1 tablet (500 mg total) by mouth every six (6) hours as needed for pain. 30 tablet 0    amlodipine  (NORVASC ) 10 MG tablet Take 1 tablet (10 mg total) by mouth daily. 90 tablet 3    aspirin  (ECOTRIN) 81 MG tablet Take 1 tablet (81 mg total) by mouth daily. 90 tablet 3    atorvastatin  (LIPITOR ) 40 MG tablet Take 1 tablet (40 mg total) by mouth daily. 90 tablet 3    blood sugar diagnostic (ACCU-CHEK GUIDE TEST STRIPS) Strp Use to check blood sugar as directed with insulin  3 times a day & for symptoms of high or low blood sugar 100 strip 11    blood-glucose meter Misc Use to test blood sugar as directed 1 each 0    carvedilol  (COREG ) 12.5 MG tablet Take 1 tablet (12.5 mg total) by mouth two (2) times a day. 180 tablet 3    chlorthalidone  (HYGROTON ) 25 MG tablet Take 1 tablet (25 mg total) by mouth every morning. 30 tablet 11    DULoxetine  (CYMBALTA ) 30 MG capsule Take 1 capsule (30 mg total) by mouth daily. 90 capsule 1    insulin  aspart (NOVOLOG  FLEXPEN U-100 INSULIN ) 100 unit/mL (3 mL) injection pen Inject under the skin per sliding scale prior to meals. If premeal BG 151-200 take 2 additional units for correction, if 201-250 take 4 additional units, etc. Max dose 36 units per day. 15 mL 0    insulin  degludec (TRESIBA  FLEXTOUCH U-100) 100 unit/mL (3 mL) InPn Inject 0.15 mL (15 Units total) under the skin daily. Adjust as directed. Use open pen within 56 days. 15 mL 3    lancets (ACCU-CHEK SOFTCLIX LANCETS) Misc Use to check blood sugar as directed with insulin  3 times a day & for symptoms of high or low blood sugar. 100 each 0    melatonin 3 mg Tab Take 2 tablets (6 mg total) by mouth.      tacrolimus  (PROGRAF ) 0.5 MG capsule Take 1 capsule (0.5 mg total) by mouth two (2) times a day with 1 mg capsule  for total twice daily dosing of 1.5mg  60 capsule 11    tacrolimus  (PROGRAF ) 1 MG capsule Take 1 capsule (1 mg total) by mouth two (2) times a day with 0.5mg  capsule for total twice daily dosing of 1.5mg  60 capsule 11     No current facility-administered medications for this visit.       PHYSICAL EXAM:     Vitals:    05/24/24 1047   BP: 137/72   Pulse: 69     CONSTITUTIONAL: Alert, no distress  HEENT: Moist mucous membranes, OP clear. Extra ocular movements intact. Sclerae anicteric.  CARDIOVASCULAR: RRR  PULM: Clear to auscultation bilaterally  EXTREMITIES: No lower extremity edema bilaterally.   NEUROLOGIC: No gross focal motor deficits  PSYCH: alert and oriented x 3    MEDICAL DECISION MAKING    Results for orders placed or performed in visit on 05/17/24   Albumin /creatinine urine ratio   Result Value Ref Range    Creat U 80.8 Undefined mg/dL    Albumin  Quantitative, Urine 2.6 Undefined mg/dL    Albumin /Creatinine Ratio 32.2 (H) 0.0 - 30.0 ug/mg   Renal Function Panel   Result Value Ref Range    Sodium 139 135 -  145 mmol/L    Potassium 4.2 3.4 - 4.8 mmol/L    Chloride 98 98 - 107 mmol/L    CO2 26.0 20.0 - 31.0 mmol/L    Anion Gap 15 (H) 5 - 14 mmol/L    BUN 17 9 - 23 mg/dL    Creatinine 8.29 (H) 0.73 - 1.18 mg/dL    BUN/Creatinine Ratio 10     eGFR CKD-EPI (2021) Male 44 (L) >=60 mL/min/1.79m2    Glucose 217 (H) 70 - 179 mg/dL    Calcium  9.6 8.7 - 10.4 mg/dL    Phosphorus 3.6 2.4 - 5.1 mg/dL    Albumin  3.8 3.4 - 5.0 g/dL     *Note: Due to a large number of results and/or encounters for the requested time period, some results have not been displayed. A complete set of results can be found in Results Review.        Creatinine   Date Value Ref Range Status   05/17/2024 1.70 (H) 0.73 - 1.18 mg/dL Final   92/82/7974 8.42 (H) 0.76 - 1.27 mg/dL Final   93/82/7974 8.33 (H) 0.73 - 1.18 mg/dL Final   94/92/7974 8.45 (H) 0.76 - 1.27 mg/dL Final   96/68/7974 8.45 (H) 0.73 - 1.18 mg/dL Final   98/72/7974 8.18 (H) 0.76 - 1.27 mg/dL Final   98/78/7974 8.40 (H) 0.76 - 1.27 mg/dL Final   98/85/7974 8.29 (H) 0.76 - 1.27 mg/dL Final        No results for input(s): NA, K, CL, BUN, CREATININE, GFR, GLU in the last 24 hours.    Invalid input(s): C02          IMAGING STUDIES:     CT A/P w/o contrast March 27, 2023-   Impression   1.  Small mesenteric fat-containing midline ventral, umbilical, and left-sided inguinal hernias are noted (as detailed). No associated fat stranding or free fluid is identified to suggest ischemia or vascular compromise. Clinical correlation with physical exam is recommended.   2.  Nonspecific haziness of the central mesentery with scattered subcentimeter central mesenteric lymph nodes also noted. Findings are overall stable to slightly increased in conspicuity when compared to prior exam. Findings are nonspecific, however, could potentially be seen in the setting of sclerosing mesenteritis. Clinical correlation is recommended.   3.  Other chronic or incidental findings, as described within the body of the report.   KIDNEYS/URETERS: Smooth renal contours.  No nephrolithiasis.  No ureteral dilatation or collecting system distention.     ASSESSMENT/PLAN:    [name] is a 63 y.o. patient with a past medical history significant for diabetes, hypertension, and liver transplant who is being seen in clinic.     CKD III: CKD dating back for about 2-3 years.  He had worsening of his kidney function in the approximately 6 months leading up to his liver transplant, presumably 2/2 his liver failure.  His renal function improved a couple months posttransplant but not to normal/prior levels.  He has had some progression in his kidney disease since that time (graph above). SPEP/IFE, SFLC without evidence of monoclonal gammopathy. Most recent baseline creatinine appears to be 1.6-1.8.   Lab Results   Component Value Date    CREATININE 1.70 (H) 05/17/2024   - Cr within baseline, eGFR 44  - The patient has been counseled on avoiding nephrotoxic agents including NSAIDs. He reports that he knows to avoid NSAIDs and does so. Had used in the past when not able to take tylenol  but reports he did  not use frequently.   - On atorvastatin  40mg  daily. LDL goal <70 given DM. Most Recent LDL: 59  LDL date: 11/10/2023 At goal, no changes.   - UACR 32.12mcg/mg. ACEi/ARB - none currently, but hyperkalemia has been a barrier. Starting telmisartan 20mg  daily as below, with Veltassa  to manage K. SGLT2i - none currently.   - Hgb 13.2 in 02/2024 - no anemia  - Discussed that good control of blood pressure and diabetes will be important to slow progression of kidney disease, addressed below.     Bone Mineral Metabolism:   Lab Results   Component Value Date    PTH 114.1 (H) 11/10/2023    CALCIUM  9.6 05/17/2024     HTN: Goal <130/80.   BP Readings from Last 3 Encounters:   05/24/24 137/72   01/27/24 147/73   11/10/23 141/64     Lab Results   Component Value Date    K 4.2 05/17/2024   - Current meds: amlodipine  10mg , chlorthalidone  25mg , Coreg  12.5mg  BID.   - Discussed home monitoring at last visit, does not sound like he has been checking at home.   - Discussed adding an ARB for BP and albuminuria if he is willing to take Veltassa  daily. He is amenable to this. Start telmisartan 20mg  daily and Veltassa  8.4mg  daily today, check labs in 1-2 weeks.     H/o hyperkalemia: likely 2/2 CKD, tacrolimus . Coreg  could contribute as well. He has a h/o adrenal insufficiency - states he had this prior to liver xplant, but no longer (states he had a test for this just prior to liver transplant which was negative so meds stopped).   Lab Results   Component Value Date    K 4.2 05/17/2024   - Consider switching Coreg  to an alternative if this remains problematic   - Provided information on low K diet but he has been educated on this before, sounds like potatoes are a challenge for him  - Has Veltassa  at home (not taking currently), typically takes prn if K high. Will have him take 8.4g daily as I am starting ARB and check labs in 1-2 weeks to ensure K staying wnl.  He understands and is amenable.    DM: diagnosed in 2017 per notes. Notes state this is c/b neuropathy but he doesn't think he has this. Managed by endocrinology.   Lab Results   Component Value Date    A1C 6.8 (H) 05/08/2023   - A1C in 11s in 01/2024, saw endocrinology in 01/2024 and meds were adjusted, reports he has follow-up in 07/2024.   - Current medications: Tresiba  38u daily, Novolog  sliding scale. Has CGM.   - Ozempic  and Mounjaro stopped due to diarrhea. Was on metformin in the past, stopped prior to liver transplant (presume 2/2 kidney disease).   - He is going to make an appointment to follow-up with endocrinology.  Would benefit from SGLT2i if albuminuria present, though at his current GFR may not have a big impact on glycemic control.   - Not recommended to start metformin when GFR <45 so likely not an option going forward     [name] will follow up in 4 months  The patient will need renal function panel, UACR within 4 weeks prior to next visit.

## 2024-05-24 NOTE — Unmapped (Addendum)
 I would like to start a blood pressure medication called telmisartan 20mg  daily - this can help lower the amount of protein in your urine and protect your kidney function. However, it can also raise your potassium levels so you will also need to take the Veltassa  packet (8.4g) daily to control potassium levels, and limit potassium in your diet. I have sent both of these to your pharmacy and we will need to check your labs in 1-2 weeks.

## 2024-05-24 NOTE — Unmapped (Signed)
 [facility] Special[facility] and Home Delivery Pharm[facility] Refill Coordination Note    Specialty Medication(s) to be Shipped:   Transplant: tacrolimus  1mg     Other medication(s) to be shipped: Tresiba     Specialty Medications not needed at this time: Transplant: tacrolimus  0.5mg ( med not needed at this time)     Matthew Key, DOB: 10/09/1960  Phone: [phone] (home) [phone] (work)      All above HIPAA information was verified with patient.     Was a Nurse, learning disability used for this call? No    Completed refill call assessment today to schedule patient's medication shipment from the [facility]  [phone]).  All relevant notes have been reviewed.     Specialty medication(s) and dose(s) confirmed: Regimen is correct and unchanged.   Changes to medications: Rik reports no changes at this time.  Changes to insurance: No  New side effects reported not previously addressed with a pharmacist or physician: None reported  Questions for the pharmacist: No    Confirmed patient received a Conservation officer, historic buildings and a Surveyor, mining with first shipment. The patient will receive a drug information handout for each medication shipped and additional FDA Medication Guides as required.       DISEASE/MEDICATION-SPECIFIC INFORMATION        N/A    SPECIALTY MEDICATION ADHERENCE     Medication Adherence    Patient reported X missed doses in the last month: 0  Specialty Medication: tacrolimus  1 MG capsule (PROGRAF )  Patient is on additional specialty medications: No  Patient is on more than two specialty medications: No  Any gaps in refill history greater than 2 weeks in the last 3 months: no  Demonstrates understanding of importance of adherence: yes  Informant: patient  Confirmed plan for next specialty medication refill: delivery by pharmacy  Refills needed for supportive medications: not needed          Refill Coordination    Has the Patients' Contact Information Changed: No  Is the Shipping Address Different: No         Were doses missed due to medication being on hold? No    tacrolimus  1  mg: 5 days of medicine on hand     REFERRAL TO PHARMACIST     Referral to the pharmacist: Not needed      [facility]     Shipping address confirmed in Epic.     Cost and Payment: Patient has a copay of $16.11. They are aware and have authorized the pharmacy to charge the credit card on file.    Delivery Scheduled: Yes, Expected medication delivery date: 05/27/24.     Medication will be delivered via UPS to the prescription address in Epic WAM.    Matthew Key   [facility] Delivery Pharm[facility]  Specialty Technician

## 2024-05-26 MED FILL — TRESIBA FLEXTOUCH U-100 INSULIN 100 UNIT/ML (3 ML) SUBCUTANEOUS PEN: SUBCUTANEOUS | 100 days supply | Qty: 15 | Fill #2

## 2024-05-26 MED FILL — TACROLIMUS 1 MG CAPSULE, IMMEDIATE-RELEASE: ORAL | 30 days supply | Qty: 60 | Fill #3

## 2024-05-31 DIAGNOSIS — Z944 Liver transplant status: Principal | ICD-10-CM

## 2024-05-31 DIAGNOSIS — E612 Magnesium deficiency: Principal | ICD-10-CM

## 2024-05-31 DIAGNOSIS — Z5181 Encounter for therapeutic drug level monitoring: Principal | ICD-10-CM

## 2024-06-10 MED ORDER — SODIUM ZIRCONIUM CYCLOSILICATE 10 GRAM ORAL POWDER PACKET
PACK | Freq: Every day | ORAL | 3 refills | 90.00000 days | Status: CP
Start: 2024-06-10 — End: ?

## 2024-06-10 NOTE — Telephone Encounter (Signed)
 PA for Veltassa  indicating that Lokelma  preferred, placed Rx for Lokelma  10g daily.

## 2024-06-14 DIAGNOSIS — E611 Iron deficiency: Principal | ICD-10-CM

## 2024-06-14 DIAGNOSIS — R790 Abnormal level of blood mineral: Principal | ICD-10-CM

## 2024-06-14 DIAGNOSIS — Z79899 Other long term (current) drug therapy: Principal | ICD-10-CM

## 2024-06-14 DIAGNOSIS — Z944 Liver transplant status: Principal | ICD-10-CM

## 2024-06-14 NOTE — Telephone Encounter (Signed)
 [phone]     Pt messaged via mychart with noted unsteadiness and fatigue. This coordinator discussed this with Dr. Erick, requesting a full set of labs plus an iron and ferritin level.     Orders entered.     This coordinator called patient to do a more thorough assessment on the s phone and to update about the need for lab.     Pt answered - no other symptoms of note. Pt instructed to have labs drawn including iron and ferritin. He requested labs be ordered for [facility].     Labs ordered. Pt plans to get them drawn today. 06/14/2024

## 2024-06-15 LAB — COMPREHENSIVE METABOLIC PANEL
ALBUMIN: 3.8 g/dL — ABNORMAL LOW (ref 3.9–4.9)
ALKALINE PHOSPHATASE: 206 IU/L — ABNORMAL HIGH (ref 47–123)
ALT (SGPT): 9 IU/L (ref 0–44)
AST (SGOT): 7 IU/L (ref 0–40)
BILIRUBIN TOTAL (MG/DL) IN SER/PLAS: 0.5 mg/dL (ref 0.0–1.2)
BLOOD UREA NITROGEN: 19 mg/dL (ref 8–27)
BUN / CREAT RATIO: 13 (ref 10–24)
CALCIUM: 9.6 mg/dL (ref 8.6–10.2)
CHLORIDE: 94 mmol/L — ABNORMAL LOW (ref 96–106)
CO2: 28 mmol/L (ref 20–29)
CREATININE: 1.51 mg/dL — ABNORMAL HIGH (ref 0.76–1.27)
GLOBULIN, TOTAL: 2.7 g/dL (ref 1.5–4.5)
GLUCOSE: 197 mg/dL — ABNORMAL HIGH (ref 70–99)
POTASSIUM: 4.6 mmol/L (ref 3.5–5.2)
SODIUM: 136 mmol/L (ref 134–144)
TOTAL PROTEIN: 6.5 g/dL (ref 6.0–8.5)

## 2024-06-15 LAB — CBC W/ DIFFERENTIAL
BANDED NEUTROPHILS ABSOLUTE COUNT: 0.1 x10E3/uL (ref 0.0–0.1)
BASOPHILS ABSOLUTE COUNT: 0 x10E3/uL (ref 0.0–0.2)
BASOPHILS RELATIVE PERCENT: 0 %
EOSINOPHILS ABSOLUTE COUNT: 0.1 x10E3/uL (ref 0.0–0.4)
EOSINOPHILS RELATIVE PERCENT: 1 %
HEMATOCRIT: 46.4 % (ref 37.5–51.0)
HEMOGLOBIN: 14.9 g/dL (ref 13.0–17.7)
IMMATURE GRANULOCYTES: 1 %
LYMPHOCYTES ABSOLUTE COUNT: 2.5 x10E3/uL (ref 0.7–3.1)
LYMPHOCYTES RELATIVE PERCENT: 17 %
MEAN CORPUSCULAR HEMOGLOBIN CONC: 32.1 g/dL (ref 31.5–35.7)
MEAN CORPUSCULAR HEMOGLOBIN: 27.6 pg (ref 26.6–33.0)
MEAN CORPUSCULAR VOLUME: 86 fL (ref 79–97)
MONOCYTES ABSOLUTE COUNT: 0.9 x10E3/uL (ref 0.1–0.9)
MONOCYTES RELATIVE PERCENT: 6 %
NEUTROPHILS ABSOLUTE COUNT: 11.1 x10E3/uL — ABNORMAL HIGH (ref 1.4–7.0)
NEUTROPHILS RELATIVE PERCENT: 75 %
PLATELET COUNT: 264 x10E3/uL (ref 150–450)
RED BLOOD CELL COUNT: 5.39 x10E6/uL (ref 4.14–5.80)
RED CELL DISTRIBUTION WIDTH: 13.3 % (ref 11.6–15.4)
WHITE BLOOD CELL COUNT: 14.8 x10E3/uL — ABNORMAL HIGH (ref 3.4–10.8)

## 2024-06-15 LAB — PHOSPHORUS: PHOSPHORUS, SERUM: 2.8 mg/dL (ref 2.8–4.1)

## 2024-06-15 LAB — GAMMA GT: GAMMA GLUTAMYL TRANSFERASE: 40 IU/L (ref 0–65)

## 2024-06-15 LAB — FERRITIN: FERRITIN: 258 ng/mL (ref 30–400)

## 2024-06-15 LAB — BILIRUBIN, DIRECT: BILIRUBIN DIRECT: 0.17 mg/dL (ref 0.00–0.40)

## 2024-06-15 LAB — IRON & TIBC
IRON SATURATION: 31 % (ref 15–55)
IRON: 76 ug/dL (ref 38–169)
TOTAL IRON BINDING CAPACITY: 249 ug/dL — ABNORMAL LOW (ref 250–450)
UNSATURATED IRON BINDING CAPACITY: 173 ug/dL (ref 111–343)

## 2024-06-15 LAB — MAGNESIUM: MAGNESIUM: 2 mg/dL (ref 1.6–2.3)

## 2024-06-15 NOTE — Progress Notes (Signed)
 Labs drawn in response to feeling fatigued and imbalanced. ALT, AST, T Bili, GGT,  all normal. Alk phos elevated to 206 which is typical for him over the last year. Iron on the lower end of normal, ferratin normal. (Tac pending)  -Of note WBC elevated, ANC jump to 11.1.   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 Any thing else you would like done of followed up for him that I'm overlooking? I await your response before calling patient.

## 2024-06-15 NOTE — Telephone Encounter (Signed)
 [phone]     06/14/24, pt messaged that he has feel fatigued and unbalanced when on his feet. Directed by this coordinator to get a full set of labs drawn and iron panel and ferratin.    06/15/24, Results in. Liver numbers not concerning per Dr. Erick and this coordinator. WBC and ANC are markedly elevated. Iron panel and ferratin not concerning. Moon suggested ED with those findings.     This coordinator called patient. Informed him of the elevated WBC count and ANC. Explained we are concerned for infection. Upon further discussion with the patient he states he had a new shot/injection in his back at the spine clinic and that's the only thing that has changed for him in the last 12 days. No new vaccinations recently. Pt states he he feeling pretty good today but understands the importance of being seen for infections. Pt states he needs to take his wife to an appointment today but will try and make it to [facility] hospital ED 11/5 AM.

## 2024-06-16 LAB — TACROLIMUS LEVEL: TACROLIMUS BLOOD: 8 ng/mL (ref 5.0–20.0)

## 2024-06-16 MED ORDER — DULOXETINE 30 MG CAPSULE,DELAYED RELEASE
ORAL_CAPSULE | Freq: Every day | ORAL | 1 refills | 90.00000 days
Start: 2024-06-16 — End: ?

## 2024-06-16 NOTE — Progress Notes (Signed)
 TFC was asked to call patient about [facility] Adv plan being out of network with [facility] in Jan 2026, patient stated he is going to look into a [facility] Medicare Adv plan or BCBS.

## 2024-06-16 NOTE — Progress Notes (Signed)
 [facility] Specialty and Home Delivery Pharmacy Refill Coordination Note    Specialty Medication(s) to be Shipped:   Transplant: tacrolimus  1mg     Other medication(s) to be shipped: atorvastatin , duloxetine , Lokelma     Specialty Medications not needed at this time: Transplant: tacrolimus  0.5mg (has more than 14 days of med)     Matthew Key, DOB: April 18, 1961  Phone: [phone] (home) [phone] (work)      All above HIPAA information was verified with patient.     Was a nurse, learning disability used for this call? No    Completed refill call assessment today to schedule patient's medication shipment from the [facility] and Home Delivery Pharmacy  [phone]).  All relevant notes have been reviewed.     Specialty medication(s) and dose(s) confirmed: Regimen is correct and unchanged.   Changes to medications: [place] reports no changes at this time.  Changes to insurance: No  New side effects reported not previously addressed with a pharmacist or physician: None reported  Questions for the pharmacist: No    Confirmed patient received a Conservation Officer, Historic Buildings and a Surveyor, Mining with first shipment. The patient will receive a drug information handout for each medication shipped and additional FDA Medication Guides as required.       DISEASE/MEDICATION-SPECIFIC INFORMATION        N/A    SPECIALTY MEDICATION ADHERENCE     Medication Adherence    Patient reported X missed doses in the last month: 0  Specialty Medication: tacrolimus  1 MG capsule (PROGRAF )  Patient is on additional specialty medications: No  Patient is on more than two specialty medications: No  Any gaps in refill history greater than 2 weeks in the last 3 months: no  Demonstrates understanding of importance of adherence: yes  Informant: patient  Confirmed plan for next specialty medication refill: delivery by pharmacy  Refills needed for supportive medications: not needed          Refill Coordination    Has the Patients' Contact Information Changed: No  Is the Shipping Address Different: No         Were doses missed due to medication being on hold? No    tacrolimus  1 mg: 7 days of medicine on hand       REFERRAL TO PHARMACIST     Referral to the pharmacist: Not needed      [facility]     Shipping address confirmed in Epic.     Cost and Payment: Patient has a copay of $9.10. They are aware and have authorized the pharmacy to charge the credit card on file.    Delivery Scheduled: Yes, Expected medication delivery date: 06/23/24.     Medication will be delivered via UPS to the prescription address in Epic WAM.    Suzen Blood   [facility] Specialty and Home Delivery Pharmacy  Specialty Technician

## 2024-06-17 MED ORDER — DULOXETINE 30 MG CAPSULE,DELAYED RELEASE
ORAL_CAPSULE | Freq: Every day | ORAL | 1 refills | 90.00000 days
Start: 2024-06-17 — End: ?

## 2024-06-17 NOTE — Telephone Encounter (Signed)
 Check up since our last conversation where he was given instructions to go to our [facility] ED if he doesn't feel any better or feels worse per Dr. Darina request. He did not show up to our ED. Lost told this coordinator he planned to come at some point yesterday.    Called pt: left detailed message to get back to me with an update. No sign of him here in our ED>   Called wife, no answer.

## 2024-06-17 NOTE — Progress Notes (Signed)
 Tac level elevated. Took his meds that AM before meds.

## 2024-06-18 ENCOUNTER — Emergency Department
Admit: 2024-06-18 | Discharge: 2024-06-18 | Disposition: A | Discharge status: Home / Self Care | Payer: Medicare (Managed Care)

## 2024-06-18 DIAGNOSIS — D72829 Elevated white blood cell count, unspecified: Principal | ICD-10-CM

## 2024-06-18 LAB — URINALYSIS WITH MICROSCOPY WITH CULTURE REFLEX PERFORMABLE
BACTERIA: NONE SEEN /HPF
BILIRUBIN UA: NEGATIVE
BLOOD UA: NEGATIVE
GLUCOSE UA: >1000 — AB
KETONES UA: NEGATIVE
LEUKOCYTE ESTERASE UA: NEGATIVE
NITRITE UA: NEGATIVE
PH UA: 6.5 (ref 5.0–9.0)
PROTEIN UA: NEGATIVE
RBC UA: 1 /HPF (ref <=3)
SPECIFIC GRAVITY UA: 1.011 (ref 1.003–1.030)
SQUAMOUS EPITHELIAL: <1 /HPF (ref 0–5)
UROBILINOGEN UA: <2
WBC UA: <1 /HPF (ref <=2)

## 2024-06-18 LAB — CBC W/ AUTO DIFF
BASOPHILS ABSOLUTE COUNT: 0.1 10*9/L (ref 0.0–0.1)
BASOPHILS RELATIVE PERCENT: 1 %
EOSINOPHILS ABSOLUTE COUNT: 0.1 10*9/L (ref 0.0–0.5)
EOSINOPHILS RELATIVE PERCENT: 0.5 %
HEMATOCRIT: 42.7 % (ref 39.0–48.0)
HEMOGLOBIN: 14.2 g/dL (ref 12.9–16.5)
LYMPHOCYTES ABSOLUTE COUNT: 1.9 10*9/L (ref 1.1–3.6)
LYMPHOCYTES RELATIVE PERCENT: 17.6 %
MEAN CORPUSCULAR HEMOGLOBIN CONC: 33.3 g/dL (ref 32.0–36.0)
MEAN CORPUSCULAR HEMOGLOBIN: 27.5 pg (ref 25.9–32.4)
MEAN CORPUSCULAR VOLUME: 82.5 fL (ref 77.6–95.7)
MEAN PLATELET VOLUME: 10 fL (ref 6.8–10.7)
MONOCYTES ABSOLUTE COUNT: 0.9 10*9/L — ABNORMAL HIGH (ref 0.3–0.8)
MONOCYTES RELATIVE PERCENT: 8.1 %
NEUTROPHILS ABSOLUTE COUNT: 7.9 10*9/L — ABNORMAL HIGH (ref 1.8–7.8)
NEUTROPHILS RELATIVE PERCENT: 72.8 %
PLATELET COUNT: 251 10*9/L (ref 150–450)
RED BLOOD CELL COUNT: 5.17 10*12/L (ref 4.26–5.60)
RED CELL DISTRIBUTION WIDTH: 14.7 % (ref 12.2–15.2)
WBC ADJUSTED: 10.8 10*9/L (ref 3.6–11.2)

## 2024-06-18 LAB — COMPREHENSIVE METABOLIC PANEL
ALBUMIN: 3.4 g/dL (ref 3.4–5.0)
ALKALINE PHOSPHATASE: 189 U/L — ABNORMAL HIGH (ref 46–116)
ALT (SGPT): 10 U/L (ref 10–49)
ANION GAP: 11 mmol/L (ref 5–14)
AST (SGOT): <8 U/L (ref <=34)
BILIRUBIN TOTAL: 0.5 mg/dL (ref 0.3–1.2)
BLOOD UREA NITROGEN: 21 mg/dL (ref 9–23)
BUN / CREAT RATIO: 15
CALCIUM: 9.2 mg/dL (ref 8.7–10.4)
CHLORIDE: 98 mmol/L (ref 98–107)
CO2: 30 mmol/L (ref 20.0–31.0)
CREATININE: 1.42 mg/dL — ABNORMAL HIGH (ref 0.73–1.18)
EGFR CKD-EPI (2021) MALE: 55 mL/min/1.73m2 — ABNORMAL LOW (ref >=60)
GLUCOSE RANDOM: 294 mg/dL — ABNORMAL HIGH (ref 70–179)
POTASSIUM: 4.6 mmol/L (ref 3.4–4.8)
PROTEIN TOTAL: 7.2 g/dL (ref 5.7–8.2)
SODIUM: 139 mmol/L (ref 135–145)

## 2024-06-18 LAB — TACROLIMUS LEVEL, TIMED: TACROLIMUS BLOOD: 3.1 ng/mL

## 2024-06-18 NOTE — ED Provider Notes (Addendum)
 [facility]  Emergency Department Provider Note     ED Clinical Impression     Final diagnoses:   Leukocytosis, unspecified type (Primary)      Impression, Medical Decision Making, ED Course     Impression: 63 y.o. 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  The patient did receive a corticosteroid injection for back pain a few weeks ago. He also reports intermittent mild malaise and two episodes of night sweats.Differential diagnosis at this time includes steroid-induced leukocytosis following recent corticosteroid injection, chronic transplant rejection, infections such as CMV, COVID-19, or influenza, or other infections such as UTI.  We evaluated for signs of infection with a repeat CBC, urinalysis, respiratory pathogen panel, and a chest x-ray which all resulted within normal limits.  The patient has been afebrile and hemodynamically stable, with resolution of his leukocytosis on repeat labs.  At this time a temporary steroid-induced leukocytosis that has since resolved seems most likely. The patient's transplant hepatologist, Dr. Patrcia, was messaged regarding the patient's workup.  Given the resolution of the patient's leukocytosis he was discharged with return precautions.    Orders Placed This Encounter   Procedures    Respiratory Pathogen Panel with COVID (Nasopharyngeal)    XR Chest 2 views    Urinalysis with Microscopy with Culture Reflex (Clean Catch)    CBC w/ Differential    Comprehensive Metabolic Panel    Tacrolimus  Level, Timed    Special Airborne/Contact Isolation Status       ED Course as of 06/18/24 1223   Fri Jun 18, 2024   0959 CBC w/ Differential(!):    WBC 10.8   RBC 5.17   HGB 14.2   HCT 42.7   MCV 82.5   MCH 27.5   MCHC 33.3   RDW 14.7   MPV 10.0   Platelet 251   Neutrophils % 72.8   Lymphocytes % 17.6   Monocytes % 8.1   Eosinophils % 0.5   Basophils % 1.0   Absolute Neutrophils 7.9(!)   Absolute Lymphocytes 1.9   Absolute Monocytes  0.9(!)   Absolute Eosinophils 0.1   Absolute Basophils  0.1  CBC without leukocytosis       MDM Elements  Discussion of Management with other Physicians, QHP or Appropriate Source: Patient's transplant hepatologist, Dr. Erick  Independent Interpretation of Studies: X-ray(s) - chest x-ray with no signs of airspace disease  Escalation of Care including OBS/Admission/Transfer was considered: However, patient was determined to be appropriate for outpatient management.  ____________________________________________    The case was discussed with the attending physician, who is in agreement with the above assessment and plan.      History     Chief Complaint  Chief Complaint   Patient presents with    Abnormal Lab       HPI   Octavio Matheney Wade is a 63 y.o. male with past medical history as below who presents with fatigue and night sweats.    The patient reports that for the past couple weeks he has been  feeling fatigued, noting that he has been less active around the house which may be contributing to his symptoms.  Additionally, he reports that a couple of times this week he is woken up from sleep feeling sweaty.  He denies any fevers, chills, chest pain, abdominal pain, shortness of breath, or any other symptoms.  Due to his recent fatigue, the patient had labs drawn by his transplant hepatology team where he was found to have a WBC of 14.8 and was advised to present to the emergency department to evaluate for infection.  He states that he overall feels well today and denies any symptoms other than fatigue.    Outside Historian(s): I have obtained additional history/collateral from the patient's wife.    Past Medical History[1]    Past Surgical History[2]    Active Medications[3]     Allergies[4]    Family History[5]    Short Social History[6]     Physical Exam     VITAL SIGNS:      Vitals:    06/18/24 0806 06/18/24 0812 06/18/24 1222   BP:  142/97 159/79   Pulse: 68 66 60   Resp:  18 15   Temp:  36.9 ??C (98.4 ??F) 36.7 ??C (98 ??F)   TempSrc:  Oral Oral   SpO2: 98% 99% 98%       Constitutional: Alert and oriented. No acute distress.  Eyes: Conjunctivae are normal.  HEENT: Normocephalic and atraumatic. Conjunctivae clear. No congestion. Moist mucous membranes.   Cardiovascular: Rate as above, regular rhythm. Normal and symmetric distal pulses. Brisk capillary refill. Normal skin turgor.  Respiratory: Normal respiratory effort. Breath sounds are normal. There are no wheezing or crackles heard.  Gastrointestinal: Soft, mildly-distended, non-tender.  Genitourinary: Deferred.  Musculoskeletal: Non-tender with normal range of motion in all extremities.  Neurologic: Normal speech and language. No gross focal neurologic deficits are appreciated. Patient is moving all extremities equally, face is symmetric at rest and with speech.  Skin: Skin is warm, dry and intact. No rash noted.  Psychiatric: Mood and affect are normal. Speech and behavior are normal.     Radiology     XR Chest 2 views   Final Result      No acute airspace disease.                   Pertinent labs & imaging results that were available during my care of the patient were independently interpreted by me and considered in my medical decision making (see chart for details).    Portions of this record have been created using Scientist, clinical (histocompatibility and immunogenetics). Dictation errors have been sought, but may not have been identified and corrected.           [1]   Past Medical History:  Diagnosis Date    AKI (acute kidney injury) 12/14/2020    Anxiety 10/22/2013 Arthritis     Cervical radiculopathy 12/03/2016    Chronic pain disorder     Lower back    Cirrhosis    (CMS-HCC)     Dental abscess 10/2020    Duodenal ulcer 12/01/2017    GERD (gastroesophageal reflux disease)     History of transfusion     Hyperlipidemia 10/22/2013    Hypertension     under control with meds and weight loss    Hypertension associated with diabetes    (CMS-HCC) 09/25/2021    Hypotension 01/29/2022    Liver disease     Liver transplant status    (  CMS-HCC) 02/06/2022    Sleep apnea, obstructive     Have machine    Stroke    (CMS-HCC)     mild stroke    Type 2 diabetes mellitus with diabetic neuropathy, with long-term current use of insulin     (CMS-HCC) 06/09/2014   [2]   Past Surgical History:  Procedure Laterality Date    CHG X-RAY FOR BILE DUCT ENDOSCOPY  08/01/2022    Procedure: ENDOSCOPIC CATHETERIZATION OF THE BILIARY DUCTAL SYSTEM, RADIOLOGICAL SUPERVISION AND INTERPRETATION;  Surgeon: Minnie Krystal Claude, MD;  Location: [facility];  Service: Gastroenterology    CHG X-RAY FOR BILE DUCT ENDOSCOPY  10/11/2022    Procedure: ENDOSCOPIC CATHETERIZATION OF THE BILIARY DUCTAL SYSTEM, RADIOLOGICAL SUPERVISION AND INTERPRETATION;  Surgeon: Gwenyth Prentice Agent, MD;  Location: [facility]URES [facility];  Service: Gastroenterology    CHG X-RAY FOR BILE DUCT ENDOSCOPY  02/03/2023    Procedure: ENDOSCOPIC CATHETERIZATION OF THE BILIARY DUCTAL SYSTEM, RADIOLOGICAL SUPERVISION AND INTERPRETATION;  Surgeon: Minnie Krystal Claude, MD;  Location: GI [facility]URES [facility];  Service: Gastroenterology    CHG X-RAY FOR BILE DUCT ENDOSCOPY  07/22/2023    Procedure: ENDOSCOPIC CATHETERIZATION OF THE BILIARY DUCTAL SYSTEM, RADIOLOGICAL SUPERVISION AND INTERPRETATION;  Surgeon: Minnie Krystal Claude, MD;  Location: GI PROCEDURES [facility];  Service: Gastroenterology    KNEE SURGERY      PR CATH PLACE/CORON ANGIO, IMG SUPER/INTERP,R&L HRT CATH, L HRT VENTRIC N/A 12/19/2020    Procedure: CATH LEFT/RIGHT HEART CATHETERIZATION;  Surgeon: Donnice Beverley Chester, MD;  Location: [facility] CATH;  Service: Cardiology    PR COLONOSCOPY FLX DX W/COLLJ SPEC WHEN PFRMD N/A 12/07/2021    Procedure: COLONOSCOPY, FLEXIBLE, PROXIMAL TO SPLENIC FLEXURE; DIAGNOSTIC, W/WO COLLECTION SPECIMEN BY BRUSH OR WASH;  Surgeon: Sandrea Rollene Mood, MD;  Location: [facility]URES [facility];  Service: Gastroenterology    PR COLONOSCOPY W/BIOPSY SINGLE/MULTIPLE N/A 07/15/2022    Procedure: COLONOSCOPY, FLEXIBLE, PROXIMAL TO SPLENIC FLEXURE; WITH BIOPSY, SINGLE OR MULTIPLE;  Surgeon: Cloria Lorrene Clarks, MD;  Location: [facility]URES [facility];  Service: Gastroenterology    PR ERCP BALLOON DILATE BILIARY/PANC DUCT/AMPULLA EA N/A 08/01/2022    Procedure: ERCP;WITH TRANS-ENDOSCOPIC BALLOON DILATION OF BILIARY/PANCREATIC DUCT(S) OR OF AMPULLA, INCLUDING SPHINCTERECTOMY, WHEN PERFOREMD,EACH DUCT (56722);  Surgeon: Minnie Krystal Claude, MD;  Location: [facility]URES [facility];  Service: Gastroenterology    PR ERCP REMOVE FOREIGN BODY/STENT BILIARY/PANC DUCT N/A 10/11/2022    Procedure: ENDOSCOPIC RETROGRADE CHOLANGIOPANCREATOGRAPHY (ERCP); W/ REMOVAL OF FOREIGN BODY/STENT FROM BILIARY/PANCREATIC DUCT(S);  Surgeon: Gwenyth Prentice Agent, MD;  Location: [facility];  Service: Gastroenterology    PR ERCP REMOVE FOREIGN BODY/STENT BILIARY/PANC DUCT N/A 07/22/2023    Procedure: ENDOSCOPIC RETROGRADE CHOLANGIOPANCREATOGRAPHY (ERCP); W/ REMOVAL OF FOREIGN BODY/STENT FROM BILIARY/PANCREATIC DUCT(S);  Surgeon: Minnie Krystal Claude, MD;  Location: [facility]URES [facility];  Service: Gastroenterology    PR ERCP STENT PLACEMENT BILIARY/PANCREATIC DUCT N/A 08/01/2022    Procedure: ENDOSCOPIC RETROGRADE CHOLANGIOPANCREATOGRAPHY (ERCP); WITH PLACEMENT OF ENDOSCOPIC STENT INTO BILIARY OR PANCREATIC DUCT;  Surgeon: Minnie Krystal Claude, MD;  Location: GI PROCEDURES [facility];  Service: Gastroenterology    PR ERCP STENT PLACEMENT BILIARY/PANCREATIC DUCT N/A 02/03/2023    Procedure: ENDOSCOPIC RETROGRADE CHOLANGIOPANCREATOGRAPHY (ERCP); WITH PLACEMENT OF ENDOSCOPIC STENT INTO BILIARY OR PANCREATIC DUCT;  Surgeon: Minnie Krystal Claude, MD;  Location: [facility]URES [facility];  Service: Gastroenterology    PR ERCP,DIAGNOSTIC N/A 02/03/2023    Procedure: ERCP; DIAGNOSTIC, WITH OR WITHOUT COLLECTION OF SPECIMEN(S) BY BRUSHING OR WASHING;  Surgeon:  Minnie Krystal Claude, MD;  Location: [facility]URES [facility];  Service: Gastroenterology    PR ERCP,SPHINCTEROTOMY N/A 08/01/2022    Procedure: ERCP; W/SPHINCTEROTOMY/PAPILLOTOMY;  Surgeon: Minnie Krystal Claude, MD;  Location: [facility];  Service: Gastroenterology    PR ERCP,W/REMOVAL STONE,BIL/PANCR DUCTS N/A 10/11/2022    Procedure: ERCP; W/ENDOSCOPIC RETROGRADE REMOVAL OF CALCULUS/CALCULI FROM BILIARY &/OR PANCREATIC DUCTS;  Surgeon: Gwenyth Prentice Agent, MD;  Location: [facility]URES [facility];  Service: Gastroenterology    PR RPR AA HERNIA 1ST 3-10 CM REDUCIBLE N/A 06/17/2023    Procedure: ROBOTIC XI-REPAIR OF ANTERIOR ABDOMINAL HERNIA(S) ANY APPROACH,INITIAL,INCLUDING IMPLANTATION OF MESH OR OTHER PROSTHESIS WHEN PERFORMED,TOTAL LENGTHOF DEFECT(S); 3 CM TO 10 CM,REDUCIBLE;  Surgeon: Mazzola Poli de Figueiredo, Sergio, MD;  Location: OR [facility];  Service: General Surgery    PR TRANSPLANT LIVER,ALLOTRANSPLANT N/A 02/06/2022    Procedure: LIVER ALLOTRANSPLANTATION; ORTHOTOPIC, PARTIAL OR WHOLE, FROM CADAVER OR LIVING DONOR, ANY AGE;  Surgeon: Leanna Cristi Gore, MD;  Location: MAIN OR [place];  Service: Transplant    PR TRANSPLANT,PREP DONOR LIVER/ARTERIAL N/A 02/06/2022    Procedure: BACKBNCH RECONSTRUCT OF CAD/LIVE DONOR LIVER GFT PRIOR TRANSPLANT; ARTERIAL ANASTAMOSIS, EA;  Surgeon: Leanna Cristi Gore, MD;  Location: MAIN OR [place];  Service: Transplant    PR UPPER GI ENDOSCOPY,BIOPSY N/A 10/12/2020    Procedure: UGI ENDOSCOPY; WITH BIOPSY, SINGLE OR MULTIPLE; Surgeon: Prentice CHRISTELLA Feeling, MD;  Location: [facility]URES [facility];  Service: Gastroenterology    PR UPPER GI ENDOSCOPY,DIAGNOSIS N/A 01/03/2022    Procedure: UGI ENDO, INCLUDE ESOPHAGUS, STOMACH, & DUODENUM &/OR JEJUNUM; DX W/WO COLLECTION SPECIMN, BY BRUSH OR WASH;  Surgeon: Prentice CHRISTELLA Feeling, MD;  Location: [facility] PROC[facility]URES [facility];  Service: Gastroenterology    PR UPPER GI ENDOSCOPY,LIGAT VARIX N/A 12/07/2021    Procedure: UGI ENDO; BEULAH LIG ESOPH &/OR GASTRIC VARICES;  Surgeon: Sandrea Rollene Mood, MD;  Location: [facility]URES [facility];  Service: Gastroenterology    ROOT CANAL      Front teeth   [3]   No current facility-administered medications for this encounter.     Current Outpatient Medications   Medication Sig Dispense Refill    acetaminophen  (TYLENOL ) 500 MG tablet Take 1 tablet (500 mg total) by mouth every six (6) hours as needed for pain. 30 tablet 0    amlodipine  (NORVASC ) 10 MG tablet Take 1 tablet (10 mg total) by mouth daily. 90 tablet 3    aspirin  (ECOTRIN) 81 MG tablet Take 1 tablet (81 mg total) by mouth daily. 90 tablet 3    atorvastatin  (LIPITOR ) 40 MG tablet Take 1 tablet (40 mg total) by mouth daily. 90 tablet 3    blood sugar diagnostic (ACCU-CHEK GUIDE TEST STRIPS) Strp Use to check blood sugar as directed with insulin  3 times a day & for symptoms of high or low blood sugar 100 strip 11    blood-glucose meter Misc Use to test blood sugar as directed 1 each 0    carvedilol  (COREG ) 12.5 MG tablet Take 1 tablet (12.5 mg total) by mouth two (2) times a day. 180 tablet 3    chlorthalidone  (HYGROTON ) 25 MG tablet Take 1 tablet (25 mg total) by mouth every morning. 30 tablet 11    DULoxetine  (CYMBALTA ) 30 MG capsule Take 1 capsule (30 mg total) by mouth daily. 90 capsule 1    insulin  aspart (NOVOLOG  FLEXPEN U-100 INSULIN ) 100 unit/mL (3 mL) injection pen Inject under the skin per sliding scale prior to meals. If premeal BG 151-200  take 2 additional units for correction, if 201-250 take 4 additional units, etc. Max dose 36 units per day. 15 mL 0    insulin  degludec (TRESIBA  FLEXTOUCH U-100) 100 unit/mL (3 mL) InPn Inject 0.15 mL (15 Units total) under the skin daily. Adjust as directed. Use open pen within 56 days. 15 mL 3    lancets (ACCU-CHEK SOFTCLIX LANCETS) Misc Use to check blood sugar as directed with insulin  3 times a day & for symptoms of high or low blood sugar. 100 each 0    melatonin 3 mg Tab Take 2 tablets (6 mg total) by mouth.      sodium zirconium cyclosilicate  (LOKELMA ) 10 gram PwPk packet Take 1 packet (10 g total) by mouth daily. 90 packet 3    tacrolimus  (PROGRAF ) 0.5 MG capsule Take 1 capsule (0.5 mg total) by mouth two (2) times a day with 1 mg capsule  for total twice daily dosing of 1.5mg  60 capsule 11    tacrolimus  (PROGRAF ) 1 MG capsule Take 1 capsule (1 mg total) by mouth two (2) times a day with 0.5mg  capsule for total twice daily dosing of 1.5mg  60 capsule 11    telmisartan (MICARDIS) 20 MG tablet Take 1 tablet (20 mg total) by mouth daily. 100 tablet 3   [4]   Allergies  Allergen Reactions    Bee Venom Protein (Honey Bee) Swelling    Venom-Honey Bee Swelling   [5]   Family History  Problem Relation Age of Onset    Edema Mother     Alzheimer's disease Father     Aortic dissection Brother     Early death Brother     Aneurysm Brother    [6]   Social History  Tobacco Use    Smoking status: Never    Smokeless tobacco: Never   Vaping Use    Vaping status: Never Used   Substance Use Topics    Alcohol use: Not Currently    Drug use: Not Currently        Celestia Elaine FALCON, MD  Resident  06/18/24 1224

## 2024-06-18 NOTE — ED Triage Note (Signed)
 Hx liver transplant in 2023. Pt had labs drawn on 11/3 due to feeling unsteady and fatigued. Labs showed WBC at 14.8. Pt denies any current symptoms, denies fevers, but reports increased sweating at night.

## 2024-06-18 NOTE — Progress Notes (Unsigned)
 PT called to tell me he decided to come to the ED in reaction to his 11/3 elevated WBC and ANC changes. Per Dr. Erick, suggested pt come to ED on the 4th. Now the 7th he is here at [facility] ED to be checked over. Dr. Erick aware. On call MD to be notified.

## 2024-06-18 NOTE — ED Triage Note (Signed)
 Pt reports hx of liver transplant and told to come in d/t WBC being elevated on 06/14/24

## 2024-06-22 MED FILL — ATORVASTATIN 40 MG TABLET: ORAL | 90 days supply | Qty: 90 | Fill #1

## 2024-06-22 MED FILL — LOKELMA 10 GRAM ORAL POWDER PACKET: ORAL | 90 days supply | Qty: 90 | Fill #0

## 2024-06-22 MED FILL — TACROLIMUS 1 MG CAPSULE, IMMEDIATE-RELEASE: ORAL | 30 days supply | Qty: 60 | Fill #4

## 2024-07-12 DIAGNOSIS — Z944 Liver transplant status: Principal | ICD-10-CM

## 2024-07-12 DIAGNOSIS — Z79899 Other long term (current) drug therapy: Principal | ICD-10-CM

## 2024-07-14 NOTE — Progress Notes (Addendum)
 [facility] Specialty and Home Delivery Pharmacy Refill Coordination Note    Specialty Medication(s) to be Shipped:   Transplant: tacrolimus  1mg     Other medication(s) to be shipped: No additional medications requested for fill at this time    Specialty Medications not needed at this time: Transplant: tacrolimus  0.5mg  and N/A     Matthew Key, DOB: 07/11/1961  Phone: [phone] (home) [phone] (work)      All above HIPAA information was verified with patient.     Was a nurse, learning disability used for this call? No    Completed refill call assessment today to schedule patient's medication shipment from the [facility] Delivery Pharmacy  [phone]).  All relevant notes have been reviewed.     Specialty medication(s) and dose(s) confirmed: Regimen is correct and unchanged.   Changes to medications: Matthew Key reports no changes at this time.  Changes to insurance: No  New side effects reported not previously addressed with a pharmacist or physician: None reported  Questions for the pharmacist: No    Confirmed patient received a Conservation Officer, Historic Buildings and a Surveyor, Mining with first shipment. The patient will receive a drug information handout for each medication shipped and additional FDA Medication Guides as required.       DISEASE/MEDICATION-SPECIFIC INFORMATION        N/A    SPECIALTY MEDICATION ADHERENCE     Medication Adherence    Patient reported X missed doses in the last month: 0  Specialty Medication: tacrolimus  1 MG capsule (PROGRAF )  Patient is on additional specialty medications: No  Patient is on more than two specialty medications: No  Demonstrates understanding of importance of adherence: yes  Informant: patient  Confirmed plan for next specialty medication refill: delivery by pharmacy  Refills needed for supportive medications: not needed          Refill Coordination    Has the Patients' Contact Information Changed: No  Is the Shipping Address Different: No         Were doses missed due to medication being on hold? No    tacrolimus  1   mg: 14 days of medicine on hand     REFERRAL TO PHARMACIST     Referral to the pharmacist: Not needed      [facility]     Shipping address confirmed in Epic.     Cost and Payment: Patient has a copay of $4.20. They are aware and have authorized the pharmacy to charge the credit card on file.    Delivery Scheduled: Yes, Expected medication delivery date: 07/20/24.     Medication will be delivered via UPS to the prescription address in Epic WAM.    Suzen Blood   [facility] Delivery [facility]  Specialty Technician

## 2024-07-19 NOTE — Progress Notes (Signed)
 Matthew Key 's Tacrolimus  shipment will be delayed as a result of credit card ending in [id number] declined     I have reached out to the patient  at [phone] and left a voicemail message.  We will wait for a call back from the patient to reschedule the delivery.  We have not confirmed the new delivery date.

## 2024-07-20 MED FILL — TACROLIMUS 1 MG CAPSULE, IMMEDIATE-RELEASE: ORAL | 30 days supply | Qty: 60 | Fill #5

## 2024-07-20 NOTE — Progress Notes (Signed)
 Matthew Key 's tacrolimus  1 MG capsule (PROGRAF ) shipment will be rescheduled as a result of credit card on file and copay collected.     I have reached out to the patient  at [phone] and communicated the delivery change. We will reschedule the medication for the delivery date that the patient agreed upon.  We have confirmed the delivery date as 07/21/24

## 2024-07-22 LAB — COMPREHENSIVE METABOLIC PANEL
ALBUMIN: 4.1 g/dL (ref 3.9–4.9)
ALKALINE PHOSPHATASE: 187 IU/L — ABNORMAL HIGH (ref 47–123)
ALT (SGPT): 10 IU/L (ref 0–44)
AST (SGOT): 9 IU/L (ref 0–40)
BILIRUBIN TOTAL (MG/DL) IN SER/PLAS: 0.5 mg/dL (ref 0.0–1.2)
BLOOD UREA NITROGEN: 19 mg/dL (ref 8–27)
BUN / CREAT RATIO: 12 (ref 10–24)
CALCIUM: 9.4 mg/dL (ref 8.6–10.2)
CHLORIDE: 98 mmol/L (ref 96–106)
CO2: 25 mmol/L (ref 20–29)
CREATININE: 1.55 mg/dL — ABNORMAL HIGH (ref 0.76–1.27)
GLOBULIN, TOTAL: 2.4 g/dL (ref 1.5–4.5)
GLUCOSE: 222 mg/dL — ABNORMAL HIGH (ref 70–99)
POTASSIUM: 4.8 mmol/L (ref 3.5–5.2)
SODIUM: 138 mmol/L (ref 134–144)
TOTAL PROTEIN: 6.5 g/dL (ref 6.0–8.5)

## 2024-07-22 LAB — CBC W/ DIFFERENTIAL
BANDED NEUTROPHILS ABSOLUTE COUNT: 0.1 x10E3/uL (ref 0.0–0.1)
BASOPHILS ABSOLUTE COUNT: 0 x10E3/uL (ref 0.0–0.2)
BASOPHILS RELATIVE PERCENT: 0 %
EOSINOPHILS ABSOLUTE COUNT: 0.1 x10E3/uL (ref 0.0–0.4)
EOSINOPHILS RELATIVE PERCENT: 1 %
HEMATOCRIT: 44.8 % (ref 37.5–51.0)
HEMOGLOBIN: 14.9 g/dL (ref 13.0–17.7)
IMMATURE GRANULOCYTES: 1 %
LYMPHOCYTES ABSOLUTE COUNT: 1.9 x10E3/uL (ref 0.7–3.1)
LYMPHOCYTES RELATIVE PERCENT: 18 %
MEAN CORPUSCULAR HEMOGLOBIN CONC: 33.3 g/dL (ref 31.5–35.7)
MEAN CORPUSCULAR HEMOGLOBIN: 29.1 pg (ref 26.6–33.0)
MEAN CORPUSCULAR VOLUME: 88 fL (ref 79–97)
MONOCYTES ABSOLUTE COUNT: 0.9 x10E3/uL (ref 0.1–0.9)
MONOCYTES RELATIVE PERCENT: 8 %
NEUTROPHILS ABSOLUTE COUNT: 7.9 x10E3/uL — ABNORMAL HIGH (ref 1.4–7.0)
NEUTROPHILS RELATIVE PERCENT: 72 %
PLATELET COUNT: 275 x10E3/uL (ref 150–450)
RED BLOOD CELL COUNT: 5.12 x10E6/uL (ref 4.14–5.80)
RED CELL DISTRIBUTION WIDTH: 13.8 % (ref 11.6–15.4)
WHITE BLOOD CELL COUNT: 11 x10E3/uL — ABNORMAL HIGH (ref 3.4–10.8)

## 2024-07-22 LAB — BILIRUBIN, DIRECT: BILIRUBIN DIRECT: 0.17 mg/dL (ref 0.00–0.40)

## 2024-07-22 LAB — PHOSPHORUS: PHOSPHORUS, SERUM: 3.8 mg/dL (ref 2.8–4.1)

## 2024-07-22 LAB — MAGNESIUM: MAGNESIUM: 1.7 mg/dL (ref 1.6–2.3)

## 2024-07-22 LAB — GAMMA GT: GAMMA GLUTAMYL TRANSFERASE: 31 IU/L (ref 0–65)

## 2024-07-23 LAB — TACROLIMUS LEVEL: TACROLIMUS BLOOD: 7.8 ng/mL (ref 5.0–20.0)

## 2024-07-26 NOTE — Telephone Encounter (Signed)
 Calling patient to see if results are a true trough for tac.     Tac level 7.8 goal 4-6    Not a true trough. Discussed the rest of the labs with the patient. Reminded him to keep drinking water  as his creatine is up a bit.

## 2024-08-09 DIAGNOSIS — Z79899 Other long term (current) drug therapy: Principal | ICD-10-CM

## 2024-08-09 DIAGNOSIS — Z944 Liver transplant status: Principal | ICD-10-CM

## 2024-08-24 DIAGNOSIS — Z944 Liver transplant status: Principal | ICD-10-CM

## 2024-08-24 NOTE — Progress Notes (Signed)
 [facility] and Home Delivery Pharmacy Refill Coordination Note    Specialty Medication(s) to be Shipped:   Transplant: tacrolimus  0.5mg  and tacrolimus  1mg     Other medication(s) to be shipped: No additional medications requested for fill at this time    Specialty Medications not needed at this time: N/A     Matthew Key, DOB: 29-Oct-1960  Phone: [phone] (home) [phone] (work)      All above HIPAA information was verified with patient.     Was a nurse, learning disability used for this call? No    Completed refill call assessment today to schedule patient's medication shipment from the [facility] and Home Delivery Pharmacy  [phone]).  All relevant notes have been reviewed.     Specialty medication(s) and dose(s) confirmed: Regimen is correct and unchanged.   Changes to medications: Ry reports no changes at this time.  Changes to insurance: No  New side effects reported not previously addressed with a pharmacist or physician: None reported  Questions for the pharmacist: No    Confirmed patient received a Conservation Officer, Historic Buildings and a Surveyor, Mining with first shipment. The patient will receive a drug information handout for each medication shipped and additional FDA Medication Guides as required.       DISEASE/MEDICATION-SPECIFIC INFORMATION        N/A    SPECIALTY MEDICATION ADHERENCE     Medication Adherence    Patient reported X missed doses in the last month: 0  Specialty Medication: tacrolimus  1 MG capsule (PROGRAF )  Patient is on additional specialty medications: Yes  Additional Specialty Medications: tacrolimus  0.5 MG capsule (PROGRAF )  Patient Reported Additional Medication X Missed Doses in the Last Month: 0  Patient is on more than two specialty medications: No              Were doses missed due to medication being on hold? No      tacrolimus  0.5 MG capsule (PROGRAF ): 7-10 days of medicine on hand     tacrolimus  1 MG capsule (PROGRAF ): 7-10 days of medicine on hand       Specialty medication is an injection or given on a cycle: No    REFERRAL TO PHARMACIST     Referral to the pharmacist: Not needed      [facility]     Shipping address confirmed in Epic.     Cost and Payment: Unable to determine copay at this time as the prescription requires a prior authorization/financial assistance. Patient is aware that shipment will be held until copay has been approved and payment information collected, if needed.    Delivery Scheduled: Yes, Expected medication delivery date: 09/01/24.     Medication will be delivered via UPS to the prescription address in Epic WAM.    Tom Windsor [facility] Special[facility] and Home Delivery Pharm[facility]  Specialty Technician

## 2024-08-31 MED FILL — TACROLIMUS 1 MG CAPSULE, IMMEDIATE-RELEASE: ORAL | 30 days supply | Qty: 60 | Fill #6

## 2024-08-31 MED FILL — TACROLIMUS 0.5 MG CAPSULE, IMMEDIATE-RELEASE: ORAL | 30 days supply | Qty: 60 | Fill #2

## 2024-09-06 DIAGNOSIS — Z944 Liver transplant status: Principal | ICD-10-CM

## 2024-09-06 DIAGNOSIS — Z79899 Other long term (current) drug therapy: Secondary | ICD-10-CM
